# Patient Record
Sex: Male | Born: 1935 | Race: Black or African American | Hispanic: No | Marital: Married | Smoking: Former smoker
Health system: Southern US, Community
[De-identification: ages and names within clinical notes are randomized; demographics above are authoritative.]

## PROBLEM LIST (undated history)

## (undated) DIAGNOSIS — R011 Cardiac murmur, unspecified: Secondary | ICD-10-CM

## (undated) DIAGNOSIS — I10 Essential (primary) hypertension: Secondary | ICD-10-CM

## (undated) DIAGNOSIS — C61 Malignant neoplasm of prostate: Secondary | ICD-10-CM

## (undated) DIAGNOSIS — J189 Pneumonia, unspecified organism: Secondary | ICD-10-CM

## (undated) DIAGNOSIS — I714 Abdominal aortic aneurysm, without rupture, unspecified: Secondary | ICD-10-CM

## (undated) DIAGNOSIS — K922 Gastrointestinal hemorrhage, unspecified: Secondary | ICD-10-CM

## (undated) DIAGNOSIS — K219 Gastro-esophageal reflux disease without esophagitis: Secondary | ICD-10-CM

## (undated) DIAGNOSIS — Z950 Presence of cardiac pacemaker: Secondary | ICD-10-CM

## (undated) DIAGNOSIS — G4730 Sleep apnea, unspecified: Secondary | ICD-10-CM

## (undated) DIAGNOSIS — I509 Heart failure, unspecified: Secondary | ICD-10-CM

## (undated) DIAGNOSIS — I499 Cardiac arrhythmia, unspecified: Secondary | ICD-10-CM

## (undated) DIAGNOSIS — E785 Hyperlipidemia, unspecified: Secondary | ICD-10-CM

## (undated) DIAGNOSIS — E119 Type 2 diabetes mellitus without complications: Secondary | ICD-10-CM

## (undated) HISTORY — DX: Cardiac murmur, unspecified: R01.1

## (undated) HISTORY — DX: Abdominal aortic aneurysm, without rupture: I71.4

## (undated) HISTORY — PX: INSERT / REPLACE / REMOVE PACEMAKER: SUR710

## (undated) HISTORY — PX: PROSTATE SURGERY: SHX751

## (undated) HISTORY — PX: NEPHROSTOMY: SHX1014

## (undated) HISTORY — DX: Abdominal aortic aneurysm, without rupture, unspecified: I71.40

## (undated) HISTORY — PX: JOINT REPLACEMENT: SHX530

## (undated) SURGERY — INSERTION, CARDIAC PACEMAKER
Anesthesia: Moderate Sedation | Laterality: Bilateral

---

## 2005-03-04 ENCOUNTER — Ambulatory Visit: Payer: Self-pay | Admitting: Unknown Physician Specialty

## 2005-03-12 ENCOUNTER — Ambulatory Visit: Payer: Self-pay | Admitting: Unknown Physician Specialty

## 2005-04-11 ENCOUNTER — Ambulatory Visit: Payer: Self-pay | Admitting: Unknown Physician Specialty

## 2005-04-24 ENCOUNTER — Inpatient Hospital Stay: Payer: Self-pay | Admitting: Internal Medicine

## 2005-04-24 ENCOUNTER — Other Ambulatory Visit: Payer: Self-pay

## 2005-04-25 ENCOUNTER — Other Ambulatory Visit: Payer: Self-pay

## 2006-10-24 ENCOUNTER — Ambulatory Visit: Payer: Self-pay | Admitting: General Practice

## 2006-11-07 ENCOUNTER — Inpatient Hospital Stay: Payer: Self-pay | Admitting: General Practice

## 2006-11-25 ENCOUNTER — Inpatient Hospital Stay: Payer: Self-pay | Admitting: Internal Medicine

## 2006-11-25 ENCOUNTER — Other Ambulatory Visit: Payer: Self-pay

## 2007-10-19 ENCOUNTER — Ambulatory Visit: Payer: Self-pay | Admitting: Unknown Physician Specialty

## 2011-10-03 ENCOUNTER — Ambulatory Visit: Payer: Self-pay

## 2012-11-26 DIAGNOSIS — Z8546 Personal history of malignant neoplasm of prostate: Secondary | ICD-10-CM | POA: Insufficient documentation

## 2012-11-26 DIAGNOSIS — R339 Retention of urine, unspecified: Secondary | ICD-10-CM | POA: Insufficient documentation

## 2012-12-02 ENCOUNTER — Inpatient Hospital Stay: Payer: Self-pay | Admitting: Internal Medicine

## 2012-12-02 LAB — COMPREHENSIVE METABOLIC PANEL
Albumin: 2.9 g/dL — ABNORMAL LOW (ref 3.4–5.0)
Alkaline Phosphatase: 61 U/L (ref 50–136)
BUN: 24 mg/dL — ABNORMAL HIGH (ref 7–18)
Bilirubin,Total: 0.4 mg/dL (ref 0.2–1.0)
Calcium, Total: 7.7 mg/dL — ABNORMAL LOW (ref 8.5–10.1)
Chloride: 101 mmol/L (ref 98–107)
Co2: 24 mmol/L (ref 21–32)
EGFR (Non-African Amer.): 47 — ABNORMAL LOW
Glucose: 136 mg/dL — ABNORMAL HIGH (ref 65–99)
Osmolality: 274 (ref 275–301)
SGOT(AST): 25 U/L (ref 15–37)
Sodium: 134 mmol/L — ABNORMAL LOW (ref 136–145)

## 2012-12-02 LAB — URINALYSIS, COMPLETE
Nitrite: NEGATIVE
Protein: 30
RBC,UR: 126 /HPF (ref 0–5)

## 2012-12-02 LAB — CBC
HGB: 11.6 g/dL — ABNORMAL LOW (ref 13.0–18.0)
MCV: 84 fL (ref 80–100)
Platelet: 127 10*3/uL — ABNORMAL LOW (ref 150–440)
RDW: 14 % (ref 11.5–14.5)
WBC: 6 10*3/uL (ref 3.8–10.6)

## 2012-12-03 LAB — CBC WITH DIFFERENTIAL/PLATELET
Eosinophil #: 0 10*3/uL (ref 0.0–0.7)
Eosinophil %: 0.2 %
Lymphocyte #: 0.4 10*3/uL — ABNORMAL LOW (ref 1.0–3.6)
MCH: 28.7 pg (ref 26.0–34.0)
Monocyte %: 10.1 %
Neutrophil #: 3.5 10*3/uL (ref 1.4–6.5)
Neutrophil %: 80 %
Platelet: 114 10*3/uL — ABNORMAL LOW (ref 150–440)
RDW: 14.1 % (ref 11.5–14.5)
WBC: 4.3 10*3/uL (ref 3.8–10.6)

## 2012-12-03 LAB — MAGNESIUM: Magnesium: 1.6 mg/dL — ABNORMAL LOW

## 2012-12-03 LAB — BASIC METABOLIC PANEL
Anion Gap: 7 (ref 7–16)
BUN: 24 mg/dL — ABNORMAL HIGH (ref 7–18)
Calcium, Total: 8.3 mg/dL — ABNORMAL LOW (ref 8.5–10.1)
Chloride: 102 mmol/L (ref 98–107)
Creatinine: 1.43 mg/dL — ABNORMAL HIGH (ref 0.60–1.30)
EGFR (African American): 50 — ABNORMAL LOW
Potassium: 3.7 mmol/L (ref 3.5–5.1)

## 2012-12-03 LAB — HEMOGLOBIN A1C: Hemoglobin A1C: 7.1 % — ABNORMAL HIGH (ref 4.2–6.3)

## 2012-12-04 LAB — CREATININE, SERUM
EGFR (African American): 49 — ABNORMAL LOW
EGFR (Non-African Amer.): 42 — ABNORMAL LOW

## 2012-12-05 LAB — CBC WITH DIFFERENTIAL/PLATELET
Bands: 6 %
Basophil #: 0 10*3/uL (ref 0.0–0.1)
Basophil %: 0.5 %
Eosinophil #: 0.1 10*3/uL (ref 0.0–0.7)
HCT: 28.2 % — ABNORMAL LOW (ref 40.0–52.0)
HGB: 9.8 g/dL — ABNORMAL LOW (ref 13.0–18.0)
Lymphocyte #: 0.7 10*3/uL — ABNORMAL LOW (ref 1.0–3.6)
Lymphocytes: 23 %
MCH: 28.7 pg (ref 26.0–34.0)
Monocyte #: 0.5 x10 3/mm (ref 0.2–1.0)
Monocyte %: 11.4 %
Neutrophil #: 2.8 10*3/uL (ref 1.4–6.5)
Platelet: 117 10*3/uL — ABNORMAL LOW (ref 150–440)
RDW: 13.8 % (ref 11.5–14.5)
Segmented Neutrophils: 66 %

## 2012-12-05 LAB — BASIC METABOLIC PANEL
Calcium, Total: 8.1 mg/dL — ABNORMAL LOW (ref 8.5–10.1)
Chloride: 102 mmol/L (ref 98–107)
Co2: 28 mmol/L (ref 21–32)
Creatinine: 1.47 mg/dL — ABNORMAL HIGH (ref 0.60–1.30)
Potassium: 3.5 mmol/L (ref 3.5–5.1)
Sodium: 138 mmol/L (ref 136–145)

## 2012-12-05 LAB — OCCULT BLOOD X 1 CARD TO LAB, STOOL: Occult Blood, Feces: NEGATIVE

## 2012-12-06 LAB — BASIC METABOLIC PANEL
Anion Gap: 6 — ABNORMAL LOW (ref 7–16)
BUN: 17 mg/dL (ref 7–18)
Co2: 27 mmol/L (ref 21–32)
EGFR (Non-African Amer.): 50 — ABNORMAL LOW
Glucose: 112 mg/dL — ABNORMAL HIGH (ref 65–99)
Osmolality: 278 (ref 275–301)
Potassium: 3.6 mmol/L (ref 3.5–5.1)
Sodium: 138 mmol/L (ref 136–145)

## 2012-12-06 LAB — CBC WITH DIFFERENTIAL/PLATELET
Basophil #: 0 10*3/uL (ref 0.0–0.1)
Basophil %: 1 %
HCT: 28.5 % — ABNORMAL LOW (ref 40.0–52.0)
Lymphocyte #: 0.7 10*3/uL — ABNORMAL LOW (ref 1.0–3.6)
Lymphocyte %: 20 %
MCH: 28.7 pg (ref 26.0–34.0)
MCHC: 34.8 g/dL (ref 32.0–36.0)
Neutrophil #: 2.2 10*3/uL (ref 1.4–6.5)
Platelet: 129 10*3/uL — ABNORMAL LOW (ref 150–440)
RBC: 3.46 10*6/uL — ABNORMAL LOW (ref 4.40–5.90)
WBC: 3.4 10*3/uL — ABNORMAL LOW (ref 3.8–10.6)

## 2012-12-07 LAB — WOUND CULTURE

## 2012-12-08 LAB — CULTURE, BLOOD (SINGLE)

## 2012-12-29 ENCOUNTER — Emergency Department: Payer: Self-pay | Admitting: Emergency Medicine

## 2012-12-29 LAB — URINALYSIS, COMPLETE
Specific Gravity: 1.029 (ref 1.003–1.030)
Squamous Epithelial: NONE SEEN
WBC UR: NONE SEEN /HPF (ref 0–5)

## 2012-12-29 LAB — COMPREHENSIVE METABOLIC PANEL
Alkaline Phosphatase: 77 U/L (ref 50–136)
Anion Gap: 9 (ref 7–16)
Bilirubin,Total: 0.4 mg/dL (ref 0.2–1.0)
Calcium, Total: 9.6 mg/dL (ref 8.5–10.1)
Chloride: 105 mmol/L (ref 98–107)
Creatinine: 1.31 mg/dL — ABNORMAL HIGH (ref 0.60–1.30)
EGFR (African American): 56 — ABNORMAL LOW
Glucose: 128 mg/dL — ABNORMAL HIGH (ref 65–99)
Potassium: 4.2 mmol/L (ref 3.5–5.1)
Sodium: 138 mmol/L (ref 136–145)
Total Protein: 7.9 g/dL (ref 6.4–8.2)

## 2012-12-29 LAB — CBC
HCT: 33.5 % — ABNORMAL LOW (ref 40.0–52.0)
MCH: 28.4 pg (ref 26.0–34.0)
MCV: 84 fL (ref 80–100)
Platelet: 157 10*3/uL (ref 150–440)
RBC: 3.97 10*6/uL — ABNORMAL LOW (ref 4.40–5.90)
RDW: 14.5 % (ref 11.5–14.5)

## 2012-12-30 ENCOUNTER — Emergency Department: Payer: Self-pay | Admitting: Emergency Medicine

## 2013-04-18 DIAGNOSIS — N320 Bladder-neck obstruction: Secondary | ICD-10-CM | POA: Insufficient documentation

## 2013-04-18 DIAGNOSIS — N139 Obstructive and reflux uropathy, unspecified: Secondary | ICD-10-CM | POA: Insufficient documentation

## 2013-04-18 DIAGNOSIS — R310 Gross hematuria: Secondary | ICD-10-CM | POA: Insufficient documentation

## 2013-04-18 DIAGNOSIS — R300 Dysuria: Secondary | ICD-10-CM | POA: Insufficient documentation

## 2013-04-18 DIAGNOSIS — N368 Other specified disorders of urethra: Secondary | ICD-10-CM | POA: Insufficient documentation

## 2013-11-11 ENCOUNTER — Ambulatory Visit: Payer: Self-pay | Admitting: Family Medicine

## 2014-01-30 DIAGNOSIS — N471 Phimosis: Secondary | ICD-10-CM | POA: Insufficient documentation

## 2014-06-16 ENCOUNTER — Emergency Department: Payer: Self-pay | Admitting: Emergency Medicine

## 2014-06-16 LAB — CBC WITH DIFFERENTIAL/PLATELET
Basophil #: 0.1 10*3/uL (ref 0.0–0.1)
Basophil %: 1 %
EOS PCT: 2.2 %
Eosinophil #: 0.1 10*3/uL (ref 0.0–0.7)
HCT: 34.8 % — ABNORMAL LOW (ref 40.0–52.0)
HGB: 11.2 g/dL — ABNORMAL LOW (ref 13.0–18.0)
LYMPHS ABS: 0.8 10*3/uL — AB (ref 1.0–3.6)
Lymphocyte %: 13.3 %
MCH: 28.7 pg (ref 26.0–34.0)
MCHC: 32.3 g/dL (ref 32.0–36.0)
MCV: 89 fL (ref 80–100)
MONO ABS: 0.5 x10 3/mm (ref 0.2–1.0)
Monocyte %: 9.2 %
NEUTROS ABS: 4.3 10*3/uL (ref 1.4–6.5)
Neutrophil %: 74.3 %
PLATELETS: 150 10*3/uL (ref 150–440)
RBC: 3.91 10*6/uL — ABNORMAL LOW (ref 4.40–5.90)
RDW: 14.9 % — ABNORMAL HIGH (ref 11.5–14.5)
WBC: 5.7 10*3/uL (ref 3.8–10.6)

## 2014-06-16 LAB — TROPONIN I: Troponin-I: <0.02 ng/mL

## 2014-06-16 LAB — BASIC METABOLIC PANEL
ANION GAP: 10 (ref 7–16)
BUN: 29 mg/dL — AB (ref 7–18)
CALCIUM: 8.4 mg/dL — AB (ref 8.5–10.1)
CO2: 27 mmol/L (ref 21–32)
Chloride: 100 mmol/L (ref 98–107)
Creatinine: 1.48 mg/dL — ABNORMAL HIGH (ref 0.60–1.30)
EGFR (African American): 58 — ABNORMAL LOW
EGFR (Non-African Amer.): 48 — ABNORMAL LOW
Glucose: 215 mg/dL — ABNORMAL HIGH (ref 65–99)
OSMOLALITY: 286 (ref 275–301)
POTASSIUM: 4.3 mmol/L (ref 3.5–5.1)
Sodium: 137 mmol/L (ref 136–145)

## 2014-07-01 ENCOUNTER — Ambulatory Visit: Payer: Self-pay | Admitting: Internal Medicine

## 2014-11-01 NOTE — Discharge Summary (Signed)
PATIENT NAME:  Blake [name], KERWICK MR#:  [id number] DATE OF BIRTH:  1923-05-07  DATE OF ADMISSION:  12/02/2012 DATE OF DISCHARGE:    FINAL DIAGNOSES:  1.  Sepsis secondary to urinary tract infection.  2.  Metabolic encephalopathy secondary to #1.  3.  History of prostate cancer with chronic microscopic hematuria.  4.  Hyperlipidemia.  5.  Hypertension.  6.  Diabetes.  7.  Osteoarthritis.   HISTORY AND PHYSICAL: Please see dictated admission history and physical.   HOSPITAL COURSE: The patient was admitted with confusion, evidence of sepsis and was placed on antibiotics for recent urinary tract infection. His urinalysis and culture were unremarkable here; however, had been on Cipro fairly recently and it was unclear whether this was a partially treated infection. He also was noted to have a draining cyst on his back, which was cultured, with preliminary cultures not showing any significant growth. Antibiotics were adjusted for control of the above and his mental status cleared over the next several days. His blood pressure had been low and his blood pressure medications were held, and IV fluid was administered and this began to improve as well. Family members were at the bedside and they feel like his mental status has improved to baseline. The patient was ambulated with physical therapy. Initially it was felt that he might need skilled nursing due to her poor endurance and balance. This appeared to improve steadily over the next 48 hours. He complained of some brief pain in his right hip, which is really his right sacroiliac; however, this improved over the next 24 hours and on the day of discharge he went over 200 feet with a rolling walker without pain, although he has continued to have some balance issues and required close supervision. At this time, the patient will be discharged home in stable condition with his physical activity up with a walker as tolerated. He should follow a low sodium,  carbohydrate controlled diet. He should check his blood sugar daily and record this. We will anticipate him following Dermatology or surgery in 1 to 2 weeks and follow up with Dr. Bernardo Heater in the next 2 to 4 weeks. Home health nursing and physical therapy were also ordered to monitor blood pressure, the wound on his back, his urinary output, improve his balance, and monitor for any further right hip pain. Dry dressing should be applied over the back wound and changed as needed.   DISCHARGE MEDICATIONS:  1.  PreserVision 1 p.o. b.i.d.  2.  Pantoprazole 40 mg p.o. daily.  3.  Glimepiride 4 mg q.a.m., 2 mg p.o. q.p.m.  4.  Metformin 500 mg p.o. b.i.d.  5.  Crestor 5 mg p.o. at bedtime.  6.  Tamsulosin 0.4 mg p.o. daily.  7.  Finasteride 5 mg p.o. daily.  8.  Aspirin 81 mg p.o. daily.  9.  Bacitracin topically to the affected area every 8 hours, this may be stopped once the wound is healed.  10. Astelin 1 spray to each nostril b.i.d.  11. Augmentin 500 mg p.o. b.i.d. x 10 days. 12. Hydralazine 25 mg p.o. b.i.d. for blood pressure control.  13. The patient is to stop Diovan, hydrochlorothiazide, metoprolol and cefuroxime.    ____________________________ Adin Hector, MD bjk:aw D: 12/06/2012 12:58:31 ET T: 12/06/2012 13:24:24 ET JOB#: [id number]  cc: Adin Hector, MD, <Dictator> Scott C. Bernardo Heater, MD [facility] MD ELECTRONICALLY SIGNED 12/25/2012 7:19

## 2014-11-01 NOTE — H&P (Signed)
PATIENT NAME:  Blake [name], Blake [name] MR#:  [id number] DATE OF BIRTH:  [birth year]  DATE OF ADMISSION:  12/02/2012  REFERRING PHYSICIAN:  Dr. Michel Santee.  PRIMARY CARE PHYSICIAN:  Dr. Caryl Comes at [facility]  UROLOGIST: Dr. Bernardo Heater.   CHIEF COMPLAINT:  Lethargy, weakness, fevers.   HISTORY OF PRESENT ILLNESS:  The patient is a pleasant 81 year old African American male with a history of prostate cancer status post radiation and seeding, hyperlipidemia, hypertension, diabetes, who had hematuria in the setting of UTI late April, was started on Cipro and it resolved apparently. The patient had persistent symptoms and was referred to Urology. The patient underwent cystoscopy on the 16th with Dr. Bernardo Heater, who told family apparently that there are some scar tissue but otherwise no significant source. The patient had recurrent lethargy, fevers and UTI symptoms several days after that and was started on Ceftin and he has taken 2 days, however, presented with increased fatigue, weakness and fevers. Here, he had a T-max of 103. Urine and blood cultures have been obtained and the Hospitalist service were contacted for further evaluation and management. The patient did have brief episode of disorientation this morning as well.   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   ALLERGIES:  ALTACE.   PAST SURGICAL HISTORY:  Appendectomy, surgery for esophageal tear, macular degeneration laser surgery.   SOCIAL HISTORY:  No tobacco, occasional alcohol. No drug use. Married, lives with wife.   FAMILY HISTORY:  Positive for CAD, brother with hypertension and thyroid disease.   OUTPATIENT MEDICATIONS:  Aspirin 81 mg daily, cefuroxime 500 mg 2 times a day started on the 22nd,  Diovan/hydrochlorothiazide 160/12.5 mg once a day, finasteride 5 mg daily, glimepiride 4 mg in the morning, 2 mg in the evening, metformin 500 mg 2 times a day, metoprolol succinate 25 mg extended-release daily, pantoprazole 40 mg daily, PreserVision 1 tab daily, rosuvastatin 5 mg at bedtime, tamsulosin 0.4 mg once a day.   REVIEW OF SYSTEMS:  CONSTITUTIONAL:  Positive for weakness and fevers.  EYES:  Has a history of macular degeneration.  ENT:  Has perforated eardrum on the one side and decreased hearing, no tinnitus.  RESPIRATORY:  Has a nonproductive cough starting today, but otherwise no wheezing, shortness of breath, COPD or painful respirations.  CARDIOVASCULAR:  No chest pain, palpitations or arrhythmias.  GASTROINTESTINAL:  No nausea, vomiting, diarrhea, abdominal pain or melena.  GENITOURINARY:  Has dysuria, more so on initiation of urine stream. No hematuria, increased frequency as well.  ENDOCRINE:  No polyuria or nocturia. HEMATOLOGIC AND LYMPHATIC:  No anemia or easy bruising.  SKIN:  No rashes.  MUSCULOSKELETAL:  Has chronic arthritis. No active issues. NEUROLOGIC:  No numbness, focal weakness, stroke or TIA.  PSYCHIATRIC:  No anxiety or insomnia.   PHYSICAL EXAMINATION: VITAL SIGNS:  Temperature on arrival 99.5. T-max was 103.1. Initial pulse 92, respiratory rate 18, blood pressure 140/75, O2 sat 96% on room air.  GENERAL:  A well-developed African American elderly male lying in bed in no obvious distress.  HEENT:  Normocephalic, atraumatic. Pupils are equal and reactive. Anicteric sclerae. Extraocular muscles intact. Moist mucous membranes.  NECK:  Supple. No thyroid tenderness. No cervical lymphadenopathy.  CARDIOVASCULAR:  S1, S2 regular rate and rhythm. No murmurs, rubs, or gallops.  LUNGS:  Clear to auscultation  without wheezing, rhonchi or rales.  ABDOMEN:  Soft, nontender and nondistended. Hyperactive bowel sounds in all quadrants.  EXTREMITIES:  No significant lower  extremity edema.  SKIN:  No obvious rashes other than an area of drainage in midline back area a little lateral to the spine with brownish drainage without surrounding redness or cellulitis. NEUROLOGIC:  Cranial nerves II through XII grossly intact. Strength is 5/5 in all extremities. Sensation intact to light touch.  PSYCHIATRIC:  Awake, alert, oriented x 3. Cooperative, pleasant, conversant.   LABORATORY, DIAGNOSTIC, AND RADIOLOGICAL DATA:  Glucose 136, BUN 24, creatinine 1.35, sodium 134, potassium 3.3. LFTs:  Albumin is 2.9, otherwise within normal limits. Troponin negative. WBC 6, hemoglobin 11.6, platelets 127. UA had 3+ blood, no nitrites or leukocyte esterase, 126 RBC, 6 WBC, trace bacteria. X-ray of the chest, PA and lateral, shows mild opacity at the right cardiophrenic angle, could be atelectasis.   EKG:  Sinus rhythm with first-degree AV block, rate is 82, no acute ST elevations or depressions.   ASSESSMENT AND PLAN:  We have an 81 year old African American male with diabetes, hypertension, a history of prostate cancer status post radiation and seeding with episode of hematuria, urinary tract infection, resolved with Cipro, who developed recurrent symptoms and had cystoscopy on May 16th, and has had fevers, lethargy and urinary tract infection symptoms post cystoscopy, and Ceftin was started, today is day 3. At this point, given failed outpatient therapy, I would admit the patient to the hospital for IV antibiotics. Also start the patient on IV Levaquin. Blood and urine cultures have been obtained. I will start the patient on gentle IV fluids. I believe the patient has a recurrent urinary tract infection likely complicated in the setting of recent instrumentations. We will see what the cultures show but the urinalysis is currently not suggestive of infection, but that could be secondary to be secondary to being on antibiotics with partial treatment. The back might be a source abscess versus  sebaceous cyst. That is draining adequately. I would order warm compresses. There is no surrounding cellulitis or redness, otherwise. There is no other sources at this point. He has a dry cough but doubt that he has a pneumonia and started only today and his symptoms precede that. He does have mild hypokalemia and it would be repleted. We would hold the Diovan and resume the other medications. I would resume the glimepiride, hold the metformin, start the patient on sliding scale insulin and check a hemoglobin A1c. I would continue his benign prostatic hypertrophy medications.   CODE STATUS:  The patient is a FULL CODE.   TOTAL TIME SPENT:  55 minutes.   ____________________________ Vivien Presto, MD sa:jm D: 12/02/2012 15:46:19 ET T: 12/02/2012 16:23:19 ET JOB#: [id number]  cc: Vivien Presto, MD, <Dictator> Dr. Est[facility] MD ELECTRONICALLY SIGNED 12/12/2012 20:06

## 2015-04-08 ENCOUNTER — Emergency Department: Payer: Medicare Other

## 2015-04-08 ENCOUNTER — Inpatient Hospital Stay
Admission: EM | Admit: 2015-04-08 | Discharge: 2015-04-15 | DRG: 242 | Disposition: A | Discharge status: Home Health | Payer: Medicare Other | Attending: Internal Medicine | Admitting: Internal Medicine

## 2015-04-08 DIAGNOSIS — I440 Atrioventricular block, first degree: Secondary | ICD-10-CM | POA: Diagnosis present

## 2015-04-08 DIAGNOSIS — Z8546 Personal history of malignant neoplasm of prostate: Secondary | ICD-10-CM

## 2015-04-08 DIAGNOSIS — I25119 Atherosclerotic heart disease of native coronary artery with unspecified angina pectoris: Secondary | ICD-10-CM | POA: Diagnosis present

## 2015-04-08 DIAGNOSIS — Z8673 Personal history of transient ischemic attack (TIA), and cerebral infarction without residual deficits: Secondary | ICD-10-CM

## 2015-04-08 DIAGNOSIS — I10 Essential (primary) hypertension: Secondary | ICD-10-CM | POA: Diagnosis present

## 2015-04-08 DIAGNOSIS — Z823 Family history of stroke: Secondary | ICD-10-CM

## 2015-04-08 DIAGNOSIS — I213 ST elevation (STEMI) myocardial infarction of unspecified site: Secondary | ICD-10-CM | POA: Diagnosis present

## 2015-04-08 DIAGNOSIS — I130 Hypertensive heart and chronic kidney disease with heart failure and stage 1 through stage 4 chronic kidney disease, or unspecified chronic kidney disease: Secondary | ICD-10-CM | POA: Diagnosis present

## 2015-04-08 DIAGNOSIS — R072 Precordial pain: Secondary | ICD-10-CM

## 2015-04-08 DIAGNOSIS — E119 Type 2 diabetes mellitus without complications: Secondary | ICD-10-CM

## 2015-04-08 DIAGNOSIS — D649 Anemia, unspecified: Secondary | ICD-10-CM | POA: Diagnosis present

## 2015-04-08 DIAGNOSIS — I495 Sick sinus syndrome: Secondary | ICD-10-CM | POA: Diagnosis present

## 2015-04-08 DIAGNOSIS — I480 Paroxysmal atrial fibrillation: Secondary | ICD-10-CM | POA: Diagnosis present

## 2015-04-08 DIAGNOSIS — I219 Acute myocardial infarction, unspecified: Secondary | ICD-10-CM | POA: Diagnosis present

## 2015-04-08 DIAGNOSIS — E785 Hyperlipidemia, unspecified: Secondary | ICD-10-CM | POA: Diagnosis present

## 2015-04-08 DIAGNOSIS — I4510 Unspecified right bundle-branch block: Secondary | ICD-10-CM | POA: Diagnosis present

## 2015-04-08 DIAGNOSIS — Z951 Presence of aortocoronary bypass graft: Secondary | ICD-10-CM

## 2015-04-08 DIAGNOSIS — R0602 Shortness of breath: Secondary | ICD-10-CM

## 2015-04-08 DIAGNOSIS — Z8249 Family history of ischemic heart disease and other diseases of the circulatory system: Secondary | ICD-10-CM

## 2015-04-08 DIAGNOSIS — K219 Gastro-esophageal reflux disease without esophagitis: Secondary | ICD-10-CM | POA: Diagnosis present

## 2015-04-08 DIAGNOSIS — I509 Heart failure, unspecified: Secondary | ICD-10-CM | POA: Diagnosis present

## 2015-04-08 DIAGNOSIS — I471 Supraventricular tachycardia: Principal | ICD-10-CM | POA: Diagnosis present

## 2015-04-08 DIAGNOSIS — E1122 Type 2 diabetes mellitus with diabetic chronic kidney disease: Secondary | ICD-10-CM | POA: Diagnosis present

## 2015-04-08 DIAGNOSIS — N400 Enlarged prostate without lower urinary tract symptoms: Secondary | ICD-10-CM | POA: Diagnosis present

## 2015-04-08 DIAGNOSIS — Z79899 Other long term (current) drug therapy: Secondary | ICD-10-CM

## 2015-04-08 DIAGNOSIS — I4892 Unspecified atrial flutter: Secondary | ICD-10-CM | POA: Diagnosis present

## 2015-04-08 DIAGNOSIS — Z950 Presence of cardiac pacemaker: Secondary | ICD-10-CM

## 2015-04-08 DIAGNOSIS — D696 Thrombocytopenia, unspecified: Secondary | ICD-10-CM | POA: Diagnosis present

## 2015-04-08 DIAGNOSIS — N183 Chronic kidney disease, stage 3 (moderate): Secondary | ICD-10-CM | POA: Diagnosis present

## 2015-04-08 HISTORY — DX: Malignant neoplasm of prostate: C61

## 2015-04-08 HISTORY — DX: Type 2 diabetes mellitus without complications: E11.9

## 2015-04-08 HISTORY — DX: Hyperlipidemia, unspecified: E78.5

## 2015-04-08 HISTORY — DX: Essential (primary) hypertension: I10

## 2015-04-08 LAB — CBC WITH DIFFERENTIAL/PLATELET
BASOS PCT: 1 %
Basophils Absolute: 0.1 10*3/uL (ref 0–0.1)
EOS ABS: 0.1 10*3/uL (ref 0–0.7)
EOS PCT: 2 %
HCT: 35.2 % — ABNORMAL LOW (ref 40.0–52.0)
Hemoglobin: 11.3 g/dL — ABNORMAL LOW (ref 13.0–18.0)
Lymphocytes Relative: 16 %
Lymphs Abs: 1 10*3/uL (ref 1.0–3.6)
MCH: 27.8 pg (ref 26.0–34.0)
MCHC: 32.1 g/dL (ref 32.0–36.0)
MCV: 86.7 fL (ref 80.0–100.0)
MONO ABS: 0.6 10*3/uL (ref 0.2–1.0)
Monocytes Relative: 8 %
NEUTROS PCT: 73 %
Neutro Abs: 4.8 10*3/uL (ref 1.4–6.5)
PLATELETS: 130 10*3/uL — AB (ref 150–440)
RBC: 4.07 MIL/uL — ABNORMAL LOW (ref 4.40–5.90)
RDW: 14.6 % — AB (ref 11.5–14.5)
WBC: 6.5 10*3/uL (ref 3.8–10.6)

## 2015-04-08 LAB — COMPREHENSIVE METABOLIC PANEL
ALBUMIN: 3.7 g/dL (ref 3.5–5.0)
ALK PHOS: 71 U/L (ref 38–126)
ALT: 12 U/L — ABNORMAL LOW (ref 17–63)
ANION GAP: 9 (ref 5–15)
AST: 24 U/L (ref 15–41)
BILIRUBIN TOTAL: 0.6 mg/dL (ref 0.3–1.2)
BUN: 30 mg/dL — AB (ref 6–20)
CALCIUM: 8.8 mg/dL — AB (ref 8.9–10.3)
CO2: 24 mmol/L (ref 22–32)
Chloride: 100 mmol/L — ABNORMAL LOW (ref 101–111)
Creatinine, Ser: 1.8 mg/dL — ABNORMAL HIGH (ref 0.61–1.24)
GFR calc Af Amer: 36 mL/min — ABNORMAL LOW (ref >60)
GFR calc non Af Amer: 31 mL/min — ABNORMAL LOW (ref >60)
GLUCOSE: 233 mg/dL — AB (ref 65–99)
Potassium: 4.6 mmol/L (ref 3.5–5.1)
Sodium: 133 mmol/L — ABNORMAL LOW (ref 135–145)
TOTAL PROTEIN: 7.1 g/dL (ref 6.5–8.1)

## 2015-04-08 LAB — TSH: TSH: 4.027 u[IU]/mL (ref 0.350–4.500)

## 2015-04-08 LAB — PHOSPHORUS: PHOSPHORUS: 3.6 mg/dL (ref 2.5–4.6)

## 2015-04-08 LAB — TROPONIN I: Troponin I: 0.03 ng/mL (ref <0.031)

## 2015-04-08 LAB — MAGNESIUM: MAGNESIUM: 2.1 mg/dL (ref 1.7–2.4)

## 2015-04-08 MED ORDER — MIDAZOLAM HCL 2 MG/2ML IJ SOLN
4.0000 mg | Freq: Once | INTRAMUSCULAR | Status: AC
  Administered 2015-04-08: 4 mg via INTRAVENOUS

## 2015-04-08 MED ORDER — SODIUM CHLORIDE 0.9 % IV BOLUS (SEPSIS)
1000.0000 mL | Freq: Once | INTRAVENOUS | Status: AC
  Administered 2015-04-08: 1000 mL via INTRAVENOUS

## 2015-04-08 NOTE — ED Provider Notes (Signed)
[facility] Emergency Department Provider Note  ____________________________________________  Time seen: 2210 on arrival by EMS  I have reviewed the triage vital signs and the nursing notes.   HISTORY  Chief Complaint Chest Pain  history Limited by critical illness   HPI Blake [name] is a 81 y.o. male is brought to the ED for chest pain. EMS report that en route they noticed that his heart rate was 200 and looked like SVT so they tried vagal maneuvers, give 2 doses of adenosine, and then went on about work, they called the ED where under my medical control reviewed the situation, time of medication dosages, weight and height of the patient, and current vitals and advised 10 mg of diltiazem. They report that after that the patient became hypotensive to 70/40.  Currently the patient denies any complaints but does seem lethargic and somnolent. His cardiologist is Dr. Clayborn Bigness   No past medical history on file. Hypertension diabetes atrial fibrillation  There are no active problems to display for this patient.    No past surgical history on file.   No current outpatient prescriptions on file. Metformin lisinopril  Allergies Review of patient's allergies indicates not on file. None  No family history on file.  Social History Social History  Substance Use Topics  . Smoking status: Not on file  . Smokeless tobacco: Not on file  . Alcohol Use: Not on file   No tobacco alcohol or drug use Review of Systems  Constitutional:   No fever or chills. No weight changes Eyes:   No blurry vision or double vision.  ENT:   No sore throat. Cardiovascular:   No chest pain. Respiratory:   No dyspnea or cough. Gastrointestinal:   Negative for abdominal pain, vomiting and diarrhea.  No BRBPR or melena. Genitourinary:   Negative for dysuria, urinary retention, bloody urine, or difficulty urinating. Musculoskeletal:   Negative for back pain. No joint swelling  or pain. Skin:   Negative for rash. Neurological:   Negative for headaches, focal weakness or numbness. Psychiatric:  No anxiety or depression.   Endocrine:  No hot/cold intolerance, changes in energy, or sleep difficulty.  10-point ROS otherwise negative.  ____________________________________________   PHYSICAL EXAM:  VITAL SIGNS: ED Triage Vitals  Enc Vitals Group     BP 04/08/15 2212 72/57 mmHg     Pulse Rate 04/08/15 2212 195     Resp 04/08/15 2212 37     Temp --      Temp src --      SpO2 04/08/15 2212 83 %     Weight --      Height --      Head Cir --      Peak Flow --      Pain Score --      Pain Loc --      Pain Edu? --      Excl. in [place]? --      Constitutional:  Lethargic, confused. In acute distress Eyes:   No scleral icterus. No conjunctival pallor. PERRL. EOMI ENT   Head:   Normocephalic and atraumatic.   Nose:   No congestion/rhinnorhea. No septal hematoma   Mouth/Throat:   MMM, no pharyngeal erythema. No peritonsillar mass. No uvula shift.   Neck:   No stridor. No SubQ emphysema. No meningismus. Hematological/Lymphatic/Immunilogical:   No cervical lymphadenopathy. Cardiovascular:   Tachycardia heart rate 200. symmetric distal pulses are present in all extremities though only producing pulsatile flow  at rate of about 100 to the extremities. No murmurs, rubs, or gallops. Respiratory:   Normal respiratory effort without tachypnea nor retractions. Breath sounds are clear and equal bilaterally. No wheezes/rales/rhonchi. Gastrointestinal:   Soft and nontender. No distention. There is no CVA tenderness.  No rebound, rigidity, or guarding. Genitourinary:   deferred Musculoskeletal:   Nontender with normal range of motion in all extremities. No joint effusions.  No lower extremity tenderness.  No edema. Neurologic:   Slightly slurring speech.  CN 2-10 normal. Motor grossly intact. No pronator drift.  No gross focal neurologic deficits are  appreciated.  Skin:    Skin is warm, dry and intact. No rash noted.  No petechiae, purpura, or bullae. Psychiatric:   Mood and affect are normal. Speech and behavior are normal. Patient exhibits appropriate insight and judgment.  ____________________________________________    LABS (pertinent positives/negatives) (all labs ordered are listed, but only abnormal results are displayed) Labs Reviewed  COMPREHENSIVE METABOLIC PANEL - Abnormal; Notable for the following:    Sodium 133 (*)    Chloride 100 (*)    Glucose, Bld 233 (*)    BUN 30 (*)    Creatinine, Ser 1.80 (*)    Calcium 8.8 (*)    ALT 12 (*)    GFR calc non Af Amer 31 (*)    GFR calc Af Amer 36 (*)    All other components within normal limits  CBC WITH DIFFERENTIAL/PLATELET - Abnormal; Notable for the following:    RBC 4.07 (*)    Hemoglobin 11.3 (*)    HCT 35.2 (*)    RDW 14.6 (*)    Platelets 130 (*)    All other components within normal limits  TROPONIN I  MAGNESIUM  PHOSPHORUS  TSH  URINALYSIS COMPLETEWITH MICROSCOPIC ([facility] ONLY)   ____________________________________________   EKG  alll EKGs interpreted by me  Initial EKG at 2213 on arrival by EMS reveals SVT with a rate of 198 with retrograde P waves. Diffuse ST depressions. Regular, narrow complex, normal axis.  A repeat EKG at 2218 after electric cardioversion reveals A. fib with heart rate 55. Left axis, 1-2 mm ST depression in V3 through V4. No ST elevation.  Repeat EKG at 2222 shows sinus rhythm at rate of 61, left axis. First-degree AV block with a PR interval of 316 ms. Right bundle branch block. 1 mm ST depression in V3 through the 5.  Repeat EKG at 2330 shows sinus bradycardia rate of 53, left axis, first-degree AV block with PR interval 278. Right bundle branch block. Normal ST segments, normal T waves.  ____________________________________________    RADIOLOGY  Chest x-ray reveals cardiomegaly with mild pulmonary vascular  congestion  ____________________________________________   PROCEDURES CRITICAL CARE Performed by: Joni Fears, Westlyn Glaza   Total critical care time: 35 minutes  Critical care time was exclusive of separately billable procedures and treating other patients.  Critical care was necessary to treat or prevent imminent or life-threatening deterioration.  Critical care was time spent personally by me on the following activities: development of treatment plan with patient and/or surrogate as well as nursing, discussions with consultants, evaluation of patient's response to treatment, examination of patient, obtaining history from patient or surrogate, ordering and performing treatments and interventions, ordering and review of laboratory studies, ordering and review of radiographic studies, pulse oximetry and re-evaluation of patient's condition.  ELECTRIC CARDIOVERSION Performed by: Joni Fears, Harve Spradley Consent:  The procedure was performed in an emergent situation.  Verbal consent was obtained.  Written  consent was not obtained. Risks and benefits: risks, benefits and alternatives were discussed Consent given by: emergent Patient understanding: patient states understanding of the procedure being performed Patient consent: the patient's understanding of the procedure matches consent given Required items: required blood products, implants, devices, and special equipment available Patient identity confirmed: verbally with patient and with wristband Patient sedated: yes Sedation type: moderate (conscious) sedation Sedatives: Versed 4 mg Analgesia: None Cardioversion basis: emergent Pre-procedure rhythm: SVT rate of 200 Patient position: patient was placed in a supine position Chest area: chest area exposed Electrodes: pads Electrodes placed: anterior-posterior Number of attempts: 1 Attempt 1 mode: synchronous Attempt 1 waveform: biphasic Attempt 1 shock (in Joules): 200 Attempt 1 outcome:  conversion to normal sinus rhythm Post-procedure rhythm: normal sinus rhythm Complications: no complications Patient tolerance: Patient tolerated the procedure well with no immediate complications  Procedural sedation Performed by: Joni Fears, Yidel Teuscher Consent: Verbal consent obtained. Risks and benefits: risks, benefits and alternatives were discussed Required items: required blood products, implants, devices, and special equipment available Patient identity confirmed: Verbally with patient Time out: Immediately prior to procedure a "time out" was called to verify the correct patient, procedure, equipment, support staff and site/side marked as required.  Sedation type: moderate (conscious) sedation NPO time confirmed and considedered  Sedatives: Versed   Physician Time at Bedside: 15 minutes  Vitals: Vital signs were monitored during sedation. Cardiac Monitor, pulse oximeter Patient tolerance: Patient tolerated the procedure well with no immediate complications. Comments: Pt with uneventful recovered. Returned to pre-procedural sedation baseline   ____________________________________________   INITIAL IMPRESSION / ASSESSMENT AND PLAN / ED COURSE  Pertinent labs & imaging results that were available during my care of the patient were reviewed by me and considered in my medical decision making (see chart for details).  Patient presented in SVT. I spoke with the EMS prior to hospital arrival, when they had arrived on scene and found the patient to have SVT with a heart rate of 200. They gave him 6 mg of adenosine without effects of the knee gave him 12 mg of adenosine without effect. This was after vagal maneuvers were unsuccessful for that as well. At that time he had a blood pressure of 110/80 and since he weighs about 200 pounds and is 6 foot 2 advise them to go ahead and give him 10 mg of diltiazem as well.  On arrival in the ED EMS reported after the diltiazem and his blood  pressure decreased to 70/40. On repeat ED he was confirmed to be hypotensive. He was also somewhat somnolent and lethargic, so was necessary to proceed with electric cardioversion emergently has no other medical management was safe.  Cardioversion was performed at 2217 with success. Patient was sedated with Versed prior to the cardioversion. Blood pressure immediately improved to normal, proximal and 110/80 again. The patient remains somnolent.  10:25 PM patient's vital signs remained stable. Patient somnolent with good oxygen saturations and spontaneous respirations  10:35 PM patient's vital signs remained stable. Discussed with daughter at the bedside.  11:15 PM patient awake conversant, denies chest pain at present time. Daughter and wife updated. We'll plan for admission for further evaluation. Spouse able to provide additional history that the patient sees cardiologist Dr. Clayborn Bigness and has history of hypertension and diabetes. No history of heart attack or coronary artery intervention.   ----------------------------------------- 12:08 AM on 04/09/2015 -----------------------------------------  Labs unremarkable. Case discussed with hospitalist.  ____________________________________________   FINAL CLINICAL IMPRESSION(S) / ED DIAGNOSES  Final diagnoses:  SVT (supraventricular tachycardia)  Precordial pain      Carrie Mew, MD 04/09/15 [phone]

## 2015-04-08 NOTE — ED Notes (Signed)
Assumed pt care at this time. NAD noted. RR even and nonlabored. Family at bedside. Will continue to monitor.

## 2015-04-08 NOTE — ED Notes (Addendum)
Pt to ED via EMS from home c/o chest pain. Per EMS, pt HR 206, BP 70s/50s. Pt A&Ox4, states he feels fine. Pt is lethargic but able to answer questions. Presents with SVT, hypotension. Given adenosine and cardizem by EMS with no improvement.

## 2015-04-08 NOTE — ED Notes (Signed)
Pt showing SVT on monitor, shocked administered x1. Pt resumed NSR.

## 2015-04-09 ENCOUNTER — Encounter: Payer: Self-pay | Admitting: Internal Medicine

## 2015-04-09 DIAGNOSIS — Z823 Family history of stroke: Secondary | ICD-10-CM | POA: Diagnosis not present

## 2015-04-09 DIAGNOSIS — I480 Paroxysmal atrial fibrillation: Secondary | ICD-10-CM | POA: Diagnosis not present

## 2015-04-09 DIAGNOSIS — E1122 Type 2 diabetes mellitus with diabetic chronic kidney disease: Secondary | ICD-10-CM | POA: Diagnosis not present

## 2015-04-09 DIAGNOSIS — K219 Gastro-esophageal reflux disease without esophagitis: Secondary | ICD-10-CM | POA: Diagnosis not present

## 2015-04-09 DIAGNOSIS — I213 ST elevation (STEMI) myocardial infarction of unspecified site: Secondary | ICD-10-CM | POA: Diagnosis not present

## 2015-04-09 DIAGNOSIS — I130 Hypertensive heart and chronic kidney disease with heart failure and stage 1 through stage 4 chronic kidney disease, or unspecified chronic kidney disease: Secondary | ICD-10-CM | POA: Diagnosis not present

## 2015-04-09 DIAGNOSIS — I219 Acute myocardial infarction, unspecified: Secondary | ICD-10-CM | POA: Diagnosis present

## 2015-04-09 DIAGNOSIS — I509 Heart failure, unspecified: Secondary | ICD-10-CM | POA: Diagnosis not present

## 2015-04-09 DIAGNOSIS — Z8249 Family history of ischemic heart disease and other diseases of the circulatory system: Secondary | ICD-10-CM | POA: Diagnosis not present

## 2015-04-09 DIAGNOSIS — I10 Essential (primary) hypertension: Secondary | ICD-10-CM | POA: Diagnosis present

## 2015-04-09 DIAGNOSIS — I471 Supraventricular tachycardia: Secondary | ICD-10-CM | POA: Diagnosis present

## 2015-04-09 DIAGNOSIS — E785 Hyperlipidemia, unspecified: Secondary | ICD-10-CM | POA: Diagnosis not present

## 2015-04-09 DIAGNOSIS — I495 Sick sinus syndrome: Secondary | ICD-10-CM | POA: Diagnosis not present

## 2015-04-09 DIAGNOSIS — Z8546 Personal history of malignant neoplasm of prostate: Secondary | ICD-10-CM | POA: Diagnosis not present

## 2015-04-09 DIAGNOSIS — I4510 Unspecified right bundle-branch block: Secondary | ICD-10-CM | POA: Diagnosis not present

## 2015-04-09 DIAGNOSIS — I25119 Atherosclerotic heart disease of native coronary artery with unspecified angina pectoris: Secondary | ICD-10-CM | POA: Diagnosis not present

## 2015-04-09 DIAGNOSIS — I440 Atrioventricular block, first degree: Secondary | ICD-10-CM | POA: Diagnosis not present

## 2015-04-09 DIAGNOSIS — R072 Precordial pain: Secondary | ICD-10-CM | POA: Diagnosis present

## 2015-04-09 DIAGNOSIS — Z951 Presence of aortocoronary bypass graft: Secondary | ICD-10-CM | POA: Diagnosis not present

## 2015-04-09 DIAGNOSIS — E119 Type 2 diabetes mellitus without complications: Secondary | ICD-10-CM

## 2015-04-09 DIAGNOSIS — D696 Thrombocytopenia, unspecified: Secondary | ICD-10-CM | POA: Diagnosis not present

## 2015-04-09 DIAGNOSIS — N183 Chronic kidney disease, stage 3 (moderate): Secondary | ICD-10-CM | POA: Diagnosis not present

## 2015-04-09 DIAGNOSIS — I4892 Unspecified atrial flutter: Secondary | ICD-10-CM | POA: Diagnosis not present

## 2015-04-09 DIAGNOSIS — Z79899 Other long term (current) drug therapy: Secondary | ICD-10-CM | POA: Diagnosis not present

## 2015-04-09 DIAGNOSIS — Z8673 Personal history of transient ischemic attack (TIA), and cerebral infarction without residual deficits: Secondary | ICD-10-CM | POA: Diagnosis not present

## 2015-04-09 DIAGNOSIS — D649 Anemia, unspecified: Secondary | ICD-10-CM | POA: Diagnosis not present

## 2015-04-09 DIAGNOSIS — N400 Enlarged prostate without lower urinary tract symptoms: Secondary | ICD-10-CM | POA: Diagnosis not present

## 2015-04-09 LAB — URINALYSIS COMPLETE WITH MICROSCOPIC (ARMC ONLY)
Bilirubin Urine: NEGATIVE
Glucose, UA: NEGATIVE mg/dL
Ketones, ur: NEGATIVE mg/dL
NITRITE: NEGATIVE
PH: 5 (ref 5.0–8.0)
PROTEIN: 30 mg/dL — AB
SPECIFIC GRAVITY, URINE: 1.015 (ref 1.005–1.030)

## 2015-04-09 LAB — CBC
HEMATOCRIT: 32 % — AB (ref 40.0–52.0)
Hemoglobin: 10.8 g/dL — ABNORMAL LOW (ref 13.0–18.0)
MCH: 29.3 pg (ref 26.0–34.0)
MCHC: 33.8 g/dL (ref 32.0–36.0)
MCV: 86.7 fL (ref 80.0–100.0)
Platelets: 113 10*3/uL — ABNORMAL LOW (ref 150–440)
RBC: 3.69 MIL/uL — ABNORMAL LOW (ref 4.40–5.90)
RDW: 14.7 % — AB (ref 11.5–14.5)
WBC: 6.3 10*3/uL (ref 3.8–10.6)

## 2015-04-09 LAB — BASIC METABOLIC PANEL
Anion gap: 6 (ref 5–15)
BUN: 26 mg/dL — AB (ref 6–20)
CHLORIDE: 106 mmol/L (ref 101–111)
CO2: 28 mmol/L (ref 22–32)
CREATININE: 1.4 mg/dL — AB (ref 0.61–1.24)
Calcium: 8.8 mg/dL — ABNORMAL LOW (ref 8.9–10.3)
GFR calc Af Amer: 49 mL/min — ABNORMAL LOW (ref >60)
GFR calc non Af Amer: 43 mL/min — ABNORMAL LOW (ref >60)
GLUCOSE: 121 mg/dL — AB (ref 65–99)
POTASSIUM: 4.5 mmol/L (ref 3.5–5.1)
SODIUM: 140 mmol/L (ref 135–145)

## 2015-04-09 LAB — TROPONIN I
TROPONIN I: 0.69 ng/mL — AB (ref <0.031)
TROPONIN I: 1.54 ng/mL — AB (ref <0.031)
TROPONIN I: 1.44 ng/mL — AB (ref <0.031)

## 2015-04-09 LAB — GLUCOSE, CAPILLARY
GLUCOSE-CAPILLARY: 165 mg/dL — AB (ref 65–99)
GLUCOSE-CAPILLARY: 105 mg/dL — AB (ref 65–99)
GLUCOSE-CAPILLARY: 113 mg/dL — AB (ref 65–99)
Glucose-Capillary: 170 mg/dL — ABNORMAL HIGH (ref 65–99)
Glucose-Capillary: 95 mg/dL (ref 65–99)

## 2015-04-09 LAB — MRSA PCR SCREENING: MRSA by PCR: NEGATIVE

## 2015-04-09 MED ORDER — ONDANSETRON HCL 4 MG PO TABS: 4.0000 mg | ORAL_TABLET | Freq: Four times a day (QID) | ORAL | Status: DC | PRN

## 2015-04-09 MED ORDER — CLOPIDOGREL BISULFATE 75 MG PO TABS
75.0000 mg | ORAL_TABLET | Freq: Every day | ORAL | Status: DC
  Administered 2015-04-09 – 2015-04-11 (×3): 75 mg via ORAL
  Filled 2015-04-09 (×3): qty 1

## 2015-04-09 MED ORDER — TAMSULOSIN HCL 0.4 MG PO CAPS
0.4000 mg | ORAL_CAPSULE | Freq: Every day | ORAL | Status: DC
  Administered 2015-04-09 – 2015-04-14 (×6): 0.4 mg via ORAL
  Filled 2015-04-09 (×6): qty 1

## 2015-04-09 MED ORDER — OCUVITE-LUTEIN PO CAPS
1.0000 | ORAL_CAPSULE | Freq: Two times a day (BID) | ORAL | Status: DC
  Administered 2015-04-09 – 2015-04-15 (×13): 1 via ORAL
  Filled 2015-04-09 (×13): qty 1

## 2015-04-09 MED ORDER — FAMOTIDINE 20 MG PO TABS
20.0000 mg | ORAL_TABLET | Freq: Every day | ORAL | Status: DC
  Administered 2015-04-09 – 2015-04-15 (×7): 20 mg via ORAL
  Filled 2015-04-09 (×7): qty 1

## 2015-04-09 MED ORDER — FINASTERIDE 5 MG PO TABS
5.0000 mg | ORAL_TABLET | Freq: Every day | ORAL | Status: DC
  Administered 2015-04-09 – 2015-04-15 (×6): 5 mg via ORAL
  Filled 2015-04-09 (×6): qty 1

## 2015-04-09 MED ORDER — AZELASTINE HCL 0.1 % NA SOLN
1.0000 | Freq: Two times a day (BID) | NASAL | Status: DC
  Administered 2015-04-09 – 2015-04-15 (×13): 1 via NASAL
  Filled 2015-04-09: qty 30

## 2015-04-09 MED ORDER — INSULIN ASPART 100 UNIT/ML ~~LOC~~ SOLN
0.0000 [IU] | Freq: Every day | SUBCUTANEOUS | Status: DC
  Administered 2015-04-11: 3 [IU] via SUBCUTANEOUS
  Administered 2015-04-12 – 2015-04-13 (×2): 2 [IU] via SUBCUTANEOUS
  Filled 2015-04-09 (×2): qty 2
  Filled 2015-04-09: qty 3

## 2015-04-09 MED ORDER — TRIAMCINOLONE ACETONIDE 0.5 % EX CREA
1.0000 "application " | TOPICAL_CREAM | Freq: Two times a day (BID) | CUTANEOUS | Status: DC
  Administered 2015-04-09 – 2015-04-15 (×9): 1 via TOPICAL
  Filled 2015-04-09: qty 15

## 2015-04-09 MED ORDER — AMLODIPINE BESYLATE 5 MG PO TABS
5.0000 mg | ORAL_TABLET | Freq: Every day | ORAL | Status: DC
  Administered 2015-04-09 – 2015-04-15 (×7): 5 mg via ORAL
  Filled 2015-04-09 (×7): qty 1

## 2015-04-09 MED ORDER — NITROGLYCERIN 0.4 MG SL SUBL: 0.4000 mg | SUBLINGUAL_TABLET | SUBLINGUAL | Status: DC | PRN

## 2015-04-09 MED ORDER — ACETAMINOPHEN 325 MG PO TABS: 650.0000 mg | ORAL_TABLET | Freq: Four times a day (QID) | ORAL | Status: DC | PRN

## 2015-04-09 MED ORDER — INSULIN ASPART 100 UNIT/ML ~~LOC~~ SOLN
0.0000 [IU] | Freq: Three times a day (TID) | SUBCUTANEOUS | Status: DC
  Administered 2015-04-09 – 2015-04-10 (×3): 2 [IU] via SUBCUTANEOUS
  Administered 2015-04-11: 5 [IU] via SUBCUTANEOUS
  Administered 2015-04-12 (×2): 3 [IU] via SUBCUTANEOUS
  Administered 2015-04-13: 2 [IU] via SUBCUTANEOUS
  Administered 2015-04-13: 1 [IU] via SUBCUTANEOUS
  Administered 2015-04-13 – 2015-04-14 (×2): 2 [IU] via SUBCUTANEOUS
  Administered 2015-04-14 – 2015-04-15 (×2): 1 [IU] via SUBCUTANEOUS
  Administered 2015-04-15: 3 [IU] via SUBCUTANEOUS
  Filled 2015-04-09: qty 3
  Filled 2015-04-09: qty 1
  Filled 2015-04-09: qty 2
  Filled 2015-04-09: qty 5
  Filled 2015-04-09 (×2): qty 1
  Filled 2015-04-09 (×2): qty 2
  Filled 2015-04-09: qty 3
  Filled 2015-04-09 (×2): qty 2
  Filled 2015-04-09: qty 3
  Filled 2015-04-09: qty 2

## 2015-04-09 MED ORDER — HEPARIN SODIUM (PORCINE) 5000 UNIT/ML IJ SOLN
5000.0000 [IU] | Freq: Three times a day (TID) | INTRAMUSCULAR | Status: DC
  Administered 2015-04-09 – 2015-04-10 (×4): 5000 [IU] via SUBCUTANEOUS
  Filled 2015-04-09 (×4): qty 1

## 2015-04-09 MED ORDER — SOTALOL HCL 80 MG PO TABS
80.0000 mg | ORAL_TABLET | Freq: Two times a day (BID) | ORAL | Status: DC
  Administered 2015-04-09: 80 mg via ORAL
  Filled 2015-04-09: qty 1

## 2015-04-09 MED ORDER — SODIUM CHLORIDE 0.9 % IJ SOLN
3.0000 mL | Freq: Two times a day (BID) | INTRAMUSCULAR | Status: DC
  Administered 2015-04-09 – 2015-04-15 (×14): 3 mL via INTRAVENOUS

## 2015-04-09 MED ORDER — ONDANSETRON HCL 4 MG/2ML IJ SOLN: 4.0000 mg | Freq: Four times a day (QID) | INTRAMUSCULAR | Status: DC | PRN

## 2015-04-09 MED ORDER — ROSUVASTATIN CALCIUM 5 MG PO TABS
5.0000 mg | ORAL_TABLET | Freq: Every day | ORAL | Status: DC
  Administered 2015-04-09 – 2015-04-14 (×6): 5 mg via ORAL
  Filled 2015-04-09 (×6): qty 1

## 2015-04-09 MED ORDER — ACETAMINOPHEN 650 MG RE SUPP: 650.0000 mg | Freq: Four times a day (QID) | RECTAL | Status: DC | PRN

## 2015-04-09 MED ORDER — OXYCODONE HCL 5 MG PO TABS: 5.0000 mg | ORAL_TABLET | ORAL | Status: DC | PRN

## 2015-04-09 MED ORDER — ASPIRIN EC 81 MG PO TBEC
81.0000 mg | DELAYED_RELEASE_TABLET | Freq: Every day | ORAL | Status: DC
Start: 2015-04-09 — End: 2015-04-15
  Administered 2015-04-09 – 2015-04-15 (×7): 81 mg via ORAL
  Filled 2015-04-09 (×7): qty 1

## 2015-04-09 MED ORDER — MORPHINE SULFATE (PF) 2 MG/ML IV SOLN: 2.0000 mg | INTRAVENOUS | Status: DC | PRN

## 2015-04-09 MED ORDER — SOTALOL HCL 120 MG PO TABS
120.0000 mg | ORAL_TABLET | Freq: Two times a day (BID) | ORAL | Status: DC
  Administered 2015-04-09 – 2015-04-11 (×4): 120 mg via ORAL
  Filled 2015-04-09 (×4): qty 1

## 2015-04-09 MED ORDER — CETYLPYRIDINIUM CHLORIDE 0.05 % MT LIQD
7.0000 mL | Freq: Two times a day (BID) | OROMUCOSAL | Status: DC
  Administered 2015-04-09 – 2015-04-15 (×9): 7 mL via OROMUCOSAL

## 2015-04-09 NOTE — Consult Note (Signed)
[facility] Cardiology  CARDIOLOGY CONSULT NOTE  Patient ID: LIDO FROSCH MRN: [id number] DOB/AGE: 79/12/1922 79 y.o.  Admit date: 04/08/2015 Referring Physician Ramonita Lab M.D. Primary Physician Ramonita Lab M.D. Primary Cardiologist Lujean Amel M.D. Reason for Consultation SVT  HPI: The patient is a 81 year old gentleman with history of paroxysmal atrial fibrillation, who is admitted following an episode of chest discomfort, found to have rapid narrow complex tachycardia by EMS, refractory to adenosine and, and Cardizem. Patient subsequently underwent successful cardioversion, initially to atrial fibrillation then sinus rhythm with first-degree AV block. The patient has remained in sinus rhythm overnight, currently chest pain-free. Patient did develop out elevation in troponin, with a peak value of 1.54 and the absence of chest pain or ECG ischemic changes.  Review of systems complete and found to be negative unless listed above     Past Medical History  Diagnosis Date  . Hypertension   . Diabetes mellitus without complication   . Hyperlipidemia   . Prostate cancer     History reviewed. No pertinent past surgical history.  Prescriptions prior to admission  Medication Sig Dispense Refill Last Dose  . amLODipine (NORVASC) 5 MG tablet Take 5 mg by mouth daily.   04/08/2015 at Unknown time  . azelastine (ASTELIN) 0.1 % nasal spray Place 1 spray into both nostrils 2 (two) times daily. Use in each nostril as directed   04/08/2015 at Unknown time  . clopidogrel (PLAVIX) 75 MG tablet Take 75 mg by mouth daily.   04/08/2015 at Unknown time  . finasteride (PROSCAR) 5 MG tablet Take 5 mg by mouth daily.   04/08/2015 at Unknown time  . glimepiride (AMARYL) 2 MG tablet Take 2-4 mg by mouth 2 (two) times daily. 2 tablets in the morning and one tablet at night   04/08/2015 at Unknown time  . metFORMIN (GLUCOPHAGE) 500 MG tablet Take 500-1,000 mg by mouth 2 (two) times daily with a meal. 1000mg  in the  morning and 500mg  in the evening     . Multiple Vitamins-Minerals (PRESERVISION/LUTEIN PO) Take 1 capsule by mouth 2 (two) times daily.   04/08/2015 at Unknown time  . ranitidine (ZANTAC) 150 MG tablet Take 150 mg by mouth 2 (two) times daily.   04/08/2015 at Unknown time  . rosuvastatin (CRESTOR) 5 MG tablet Take 5 mg by mouth at bedtime.   Past Week at Unknown time  . sotalol (BETAPACE) 80 MG tablet Take 80 mg by mouth 2 (two) times daily.   04/08/2015 at 1100  . tamsulosin (FLOMAX) 0.4 MG CAPS capsule Take 0.4 mg by mouth at bedtime.   Past Week at Unknown time  . triamcinolone cream (KENALOG) 0.5 % Apply 1 application topically 2 (two) times daily.   04/08/2015 at Unknown time   Social History   Social History  . Marital Status: Married    Spouse Name: N/A  . Number of Children: N/A  . Years of Education: N/A   Occupational History  . Not on file.   Social History Main Topics  . Smoking status: Never Smoker   . Smokeless tobacco: Not on file  . Alcohol Use: No  . Drug Use: No  . Sexual Activity: Not on file   Other Topics Concern  . Not on file   Social History Narrative  . No narrative on file    Family History  Problem Relation Age of Onset  . Hypertension Other   . Stroke Other       Review of systems  complete and found to be negative unless listed above      PHYSICAL EXAM  General: Well developed, well nourished, in no acute distress HEENT:  Normocephalic and atramatic Neck:  No JVD.  Lungs: Clear bilaterally to auscultation and percussion. Heart: HRRR . Normal S1 and S2 without gallops or murmurs.  Abdomen: Bowel sounds are positive, abdomen soft and non-tender  Msk:  Back normal, normal gait. Normal strength and tone for age. Extremities: No clubbing, cyanosis or edema.   Neuro: Alert and oriented X 3. Psych:  Good affect, responds appropriately  Labs:   Lab Results  Component Value Date   WBC 6.3 04/09/2015   HGB 10.8* 04/09/2015   HCT 32.0*  04/09/2015   MCV 86.7 04/09/2015   PLT 113* 04/09/2015    Recent Labs Lab 04/08/15 2234 04/09/15 0737  NA 133* 140  K 4.6 4.5  CL 100* 106  CO2 24 28  BUN 30* 26*  CREATININE 1.80* 1.40*  CALCIUM 8.8* 8.8*  PROT 7.1  --   BILITOT 0.6  --   ALKPHOS 71  --   ALT 12*  --   AST 24  --   GLUCOSE 233* 121*   Lab Results  Component Value Date   TROPONINI 1.54* 04/09/2015   No results found for: CHOL No results found for: HDL No results found for: LDLCALC No results found for: TRIG No results found for: CHOLHDL No results found for: LDLDIRECT    Radiology: Dg Chest Portable 1 View  04/08/2015   CLINICAL DATA:  Supra ventricular tachycardia status post cardioversion.  EXAM: PORTABLE CHEST 1 VIEW  COMPARISON:  Dec 05, 2012, June 16, 2014  FINDINGS: The heart size is enlarged. There is central pulmonary vascular congestion. There is no pleural effusion or focal pneumonia. The bones are unchanged.  IMPRESSION: Cardiomegaly with mild central pulmonary vascular congestion.   Electronically Signed   By: Abelardo Diesel M.D.   On: 04/08/2015 23:12    EKG: Initial rapid, narrow complex tachycardia with aberrancy, probable atrial flutter  ASSESSMENT AND PLAN:    1. Probable rapid atrial flutter, refractory to adenosine and Cardizem, converted to atrial fibrillation and sinus rhythm, currently clinically stable, without chest pain. 2. Mild elevation in troponin, in the setting of rapid tachycardia at a rate of 200 bpm, likely due to demand supply ischemia. 3. History of paroxysmal atrial fibrillation, chads Vasc of 3, currently on clear.-year-old, with history of aspirin allergy, and GI bleed secondary to Mallory-Weiss tear  Recommendations  1. Up titrate sotalol to 120 mg twice a day 2. Continue Plavix. Defer chronic anticoagulation in light of prior history of upper GI bleeding. 3. Refer to [facility] EP, for further evaluation, which can be done as outpatient. Patient could potentially       be a candidate for ablation. 4. Review 2-D echocardiogram   Signed: Glennette Galster MD,PhD, [facility] 04/09/2015, 12:49 PM

## 2015-04-09 NOTE — Progress Notes (Signed)
Dr. Saralyn Pilar made aware of pt tropin results. No interventions at this time.

## 2015-04-09 NOTE — Progress Notes (Signed)
Patient ID: Blake [name], male   DOB: 1922/10/16, 81 y.o.   MRN: [id number] SUBJECTIVE:  Admitted this AM with CP, nausea, with finding of SVT to 200 on arrival; ECG shows narrow complex regular tachycardia. Broke to sinus rhythm with 1st degree AVB. Cardiac enzymes elevated, with second troponin 0.69.  Pt reports had felt well prior to onset of symptoms last PM  ______________________________________________________________________  ROS: Please see HPI; remainder of complete 10 point ROS is negative   Past Medical History  Diagnosis Date  . Hypertension   . Diabetes mellitus without complication   . Hyperlipidemia   . Prostate cancer     History reviewed. No pertinent past surgical history.   Current facility-administered medications:  .  acetaminophen (TYLENOL) tablet 650 mg, 650 mg, Oral, Q6H PRN **OR** acetaminophen (TYLENOL) suppository 650 mg, 650 mg, Rectal, Q6H PRN, Lytle Butte, MD .  amLODipine (NORVASC) tablet 5 mg, 5 mg, Oral, Daily, Lytle Butte, MD .  antiseptic oral rinse (CPC / CETYLPYRIDINIUM CHLORIDE 0.05%) solution 7 mL, 7 mL, Mouth Rinse, BID, [name], MD .  aspirin EC tablet 81 mg, 81 mg, Oral, Daily, Lytle Butte, MD .  azelastine (ASTELIN) 0.1 % nasal spray 1 spray, 1 spray, Each Nare, BID, Lytle Butte, MD .  clopidogrel (PLAVIX) tablet 75 mg, 75 mg, Oral, Daily, Lytle Butte, MD .  famotidine (PEPCID) tablet 20 mg, 20 mg, Oral, Daily, Lytle Butte, MD .  finasteride (PROSCAR) tablet 5 mg, 5 mg, Oral, Daily, Lytle Butte, MD .  heparin injection 5,000 Units, 5,000 Units, Subcutaneous, 3 times per day, Lytle Butte, MD, 5,000 Units at 04/09/15 [phone] .  insulin aspart (novoLOG) injection 0-5 Units, 0-5 Units, Subcutaneous, QHS, Lytle Butte, MD, 0 Units at 04/09/15 0240 .  insulin aspart (novoLOG) injection 0-9 Units, 0-9 Units, Subcutaneous, TID WC, Lytle Butte, MD, 0 Units at 04/09/15 0802 .  morphine 2 MG/ML injection 2 mg, 2 mg,  Intravenous, Q4H PRN, Lytle Butte, MD .  multivitamin-lutein (OCUVITE-LUTEIN) capsule 1 capsule, 1 capsule, Oral, BID, Lytle Butte, MD .  nitroGLYCERIN (NITROSTAT) SL tablet 0.4 mg, 0.4 mg, Sublingual, Q5 min PRN, Lytle Butte, MD .  ondansetron [facility]) tablet 4 mg, 4 mg, Oral, Q6H PRN **OR** ondansetron (ZOFRAN) injection 4 mg, 4 mg, Intravenous, Q6H PRN, Lytle Butte, MD .  oxyCODONE (Oxy IR/ROXICODONE) immediate release tablet 5 mg, 5 mg, Oral, Q4H PRN, Lytle Butte, MD .  rosuvastatin (CRESTOR) tablet 5 mg, 5 mg, Oral, QHS, Lytle Butte, MD .  sodium chloride 0.9 % injection 3 mL, 3 mL, Intravenous, Q12H, Lytle Butte, MD .  sotalol (BETAPACE) tablet 80 mg, 80 mg, Oral, BID, Lytle Butte, MD .  tamsulosin (FLOMAX) capsule 0.4 mg, 0.4 mg, Oral, QHS, Lytle Butte, MD .  triamcinolone cream (KENALOG) 0.5 % 1 application, 1 application, Topical, BID, Lytle Butte, MD  PHYSICAL EXAM:  BP 151/95 mmHg  Pulse 61  Temp(Src) 97.8 F (36.6 C) (Oral)  Resp 14  Ht 6\' 2"  (1.88 m)  Wt 97.5 kg (214 lb 15.2 oz)  BMI 27.59 kg/m2  SpO2 100%  General: elderly  male, in NAD HEENT: PERRL; OP dry without lesions. Neck: supple, trachea midline, no thyromegaly Chest: normal to palpation Lungs: clear bilaterally without retractions or wheezes Cardiovascular: occ ectopy, no gallop; distal pulses 2+ Abdomen: soft, nontender, nondistended, positive bowel sounds Extremities: no clubbing, cyanosis,  edema.  Post op right knee Neuro: alert and oriented, moves all extremities. Decreased vision Derm: no significant rashes or nodules; good skin turgor Lymph: no cervical or supraclavicular lymphadenopathy  Labs and imaging studies were reviewed  ASSESSMENT/PLAN:   1. SVT/acute MI- likely rate related event; initial ECG shows SVT, with P waves evident, ? Delta changes. Cardiology to see; await echo 2. DM- cover with SSI. Cr too high to use metformin 3. HTN- cont current meds and follow;  intolerant of ACE/ARB, hydralazine

## 2015-04-09 NOTE — Progress Notes (Signed)
Notified Dr. Lavetta Nielsen of troponin of 0.69.

## 2015-04-09 NOTE — H&P (Signed)
[facility] [place] NAME: Blake [name]    MR#:  [id number]  DATE OF BIRTH:  1922/10/12   DATE OF ADMISSION:  04/08/2015  PRIMARY CARE PHYSICIAN:  Ramonita Lab  REQUESTING/REFERRING PHYSICIAN: Joni Fears  CHIEF COMPLAINT:   Chief Complaint  Patient presents with  . Chest Pain    HISTORY OF PRESENT ILLNESS:  Blake [name]  is a 81 y.o. male with a known history of essential hypertension, hyperlipidemia unspecified, presenting with chest pain. Acute onset chest pain which occurred at rest retrosternal in location with associated diaphoresis as well as nausea. Chest pain described only as "pain" nonradiating no worsening or relieving factors. He took 2 aspirin prior to EMS arrival. They noted in the markedly tachycardic and SVT heart rate in the 200 range received adenosine without much improvement emergency bursa Cardizem without much improvement subsequently underwent cardioversion. Currently in normal sinus rhythm and chest pain-free.  PAST MEDICAL HISTORY:   Past Medical History  Diagnosis Date  . Hypertension   . Diabetes mellitus without complication   . Hyperlipidemia   . Prostate cancer     PAST SURGICAL HISTORY:  History reviewed. No pertinent past surgical history.  SOCIAL HISTORY:   Social History  Substance Use Topics  . Smoking status: Never Smoker   . Smokeless tobacco: Not on file  . Alcohol Use: No    FAMILY HISTORY:   Family History  Problem Relation Age of Onset  . Hypertension Other   . Stroke Other     DRUG ALLERGIES:  No Known Allergies  REVIEW OF SYSTEMS:  REVIEW OF SYSTEMS:  CONSTITUTIONAL: Denies fevers, chills, fatigue, weakness.  EYES: Denies blurred vision, double vision, or eye pain.  EARS, NOSE, THROAT: Denies tinnitus, ear pain, hearing loss.  RESPIRATORY: denies cough, shortness of breath, wheezing  CARDIOVASCULAR: Positive chest pain, palpitations, denies edema.   GASTROINTESTINAL: Positive nausea, denies vomiting, diarrhea, abdominal pain.  GENITOURINARY: Denies dysuria, hematuria.  ENDOCRINE: Denies nocturia or thyroid problems. HEMATOLOGIC AND LYMPHATIC: Denies easy bruising or bleeding.  SKIN: Denies rash or lesions.  MUSCULOSKELETAL: Denies pain in neck, back, shoulder, knees, hips, or further arthritic symptoms.  NEUROLOGIC: Denies paralysis, paresthesias.  PSYCHIATRIC: Denies anxiety or depressive symptoms. Otherwise full review of systems performed by me is negative.   MEDICATIONS AT HOME:   Prior to Admission medications   Medication Sig Start Date End Date Taking? Authorizing Provider  amLODipine (NORVASC) 5 MG tablet Take 5 mg by mouth daily.   Yes Historical Provider, MD  azelastine (ASTELIN) 0.1 % nasal spray Place 1 spray into both nostrils 2 (two) times daily. Use in each nostril as directed   Yes Historical Provider, MD  clopidogrel (PLAVIX) 75 MG tablet Take 75 mg by mouth daily.   Yes Historical Provider, MD  finasteride (PROSCAR) 5 MG tablet Take 5 mg by mouth daily.   Yes Historical Provider, MD  glimepiride (AMARYL) 2 MG tablet Take 2-4 mg by mouth 2 (two) times daily. 2 tablets in the morning and one tablet at night   Yes Historical Provider, MD  metFORMIN (GLUCOPHAGE) 500 MG tablet Take 500-1,000 mg by mouth 2 (two) times daily with a meal. 1000mg  in the morning and 500mg  in the evening   Yes Historical Provider, MD  Multiple Vitamins-Minerals (PRESERVISION/LUTEIN PO) Take 1 capsule by mouth 2 (two) times daily.   Yes Historical Provider, MD  ranitidine (ZANTAC) 150 MG tablet Take 150 mg by mouth 2 (two) times  daily.   Yes Historical Provider, MD  rosuvastatin (CRESTOR) 5 MG tablet Take 5 mg by mouth at bedtime.   Yes Historical Provider, MD  sotalol (BETAPACE) 80 MG tablet Take 80 mg by mouth 2 (two) times daily.   Yes Historical Provider, MD  tamsulosin (FLOMAX) 0.4 MG CAPS capsule Take 0.4 mg by mouth at bedtime.   Yes  Historical Provider, MD  triamcinolone cream (KENALOG) 0.5 % Apply 1 application topically 2 (two) times daily.   Yes Historical Provider, MD      VITAL SIGNS:  Blood pressure 135/75, pulse 65, resp. rate 17, SpO2 97 %.  PHYSICAL EXAMINATION:  VITAL SIGNS: Filed Vitals:   04/09/15 0030  BP: 135/75  Pulse: 65  Resp: 17   GENERAL:81 y.o.male currently in no acute distress.  HEAD: Normocephalic, atraumatic.  EYES: Pupils equal, round, reactive to light. Extraocular muscles intact. No scleral icterus.  MOUTH: Moist mucosal membrane. Dentition intact. No abscess noted.  EAR, NOSE, THROAT: Clear without exudates. No external lesions.  NECK: Supple. No thyromegaly. No nodules. No JVD.  PULMONARY: Clear to ascultation, without wheeze rails or rhonci. No use of accessory muscles, Good respiratory effort. good air entry bilaterally CHEST: Nontender to palpation.  CARDIOVASCULAR: S1 and S2. Regular rate and rhythm. No murmurs, rubs, or gallops. Trace edema. Pedal pulses 2+ bilaterally.  GASTROINTESTINAL: Soft, nontender, nondistended. No masses. Positive bowel sounds. No hepatosplenomegaly.  MUSCULOSKELETAL: No swelling, clubbing, or edema. Range of motion full in all extremities.  NEUROLOGIC: Cranial nerves II through XII are intact. No gross focal neurological deficits. Sensation intact. Reflexes intact.  SKIN: No ulceration, lesions, rashes, or cyanosis. Skin warm and dry. Turgor intact.  PSYCHIATRIC: Mood, affect within normal limits. The patient is awake, alert and oriented x 3. Insight, judgment intact.    LABORATORY PANEL:   CBC  Recent Labs Lab 04/08/15 2234  WBC 6.5  HGB 11.3*  HCT 35.2*  PLT 130*   ------------------------------------------------------------------------------------------------------------------  Chemistries   Recent Labs Lab 04/08/15 2234  NA 133*  K 4.6  CL 100*  CO2 24  GLUCOSE 233*  BUN 30*  CREATININE 1.80*  CALCIUM 8.8*  MG 2.1  AST 24   ALT 12*  ALKPHOS 71  BILITOT 0.6   ------------------------------------------------------------------------------------------------------------------  Cardiac Enzymes  Recent Labs Lab 04/08/15 2234  TROPONINI 0.03   ------------------------------------------------------------------------------------------------------------------  RADIOLOGY:  Dg Chest Portable 1 View  04/08/2015   CLINICAL DATA:  Supra ventricular tachycardia status post cardioversion.  EXAM: PORTABLE CHEST 1 VIEW  COMPARISON:  Dec 05, 2012, June 16, 2014  FINDINGS: The heart size is enlarged. There is central pulmonary vascular congestion. There is no pleural effusion or focal pneumonia. The bones are unchanged.  IMPRESSION: Cardiomegaly with mild central pulmonary vascular congestion.   Electronically Signed   By: Abelardo Diesel M.D.   On: 04/08/2015 23:12    EKG:   Orders placed or performed during the hospital encounter of 04/08/15  . EKG 12-Lead  . EKG 12-Lead  . EKG 12-Lead  . EKG 12-Lead  . EKG 12-Lead  . EKG 12-Lead  . Repeat EKG  . Repeat EKG    IMPRESSION AND PLAN:   81 year old African-American gentleman history of type 2 diabetes non-insulin-requiring, hyperlipidemia unspecified, hypertension essential presenting in SVT requiring cardioversion.  1. Supraventricular tachycardia: Currently normal sinus rhythm, goal electrolyte potassium 4-5, magnesium greater than 2, telemetry, cardiac enzymes 3, consult cardiology, check TSH, echocardiogram, continue sotalol 2. Type 2 diabetes non-insulin-requiring: Hold oral agents at insulin  sliding scale with Accu-Cheks 3. Hyperlipidemia unspecified: Statin therapy 4. Essential hypertension Norvasc 5. Venous thromboembolism prophylactic heparin subcutaneous    All the records are reviewed and case discussed with ED provider. Management plans discussed with the patient, family and they are in agreement.  CODE STATUS: Full  TOTAL TIME TAKING CARE OF  THIS PATIENT: 50 critical minutes.    Hower,  Karenann Cai.D on 04/09/2015 at 1:12 AM  Between 7am to 6pm - Pager - [phone]  After 6pm: House Pager: - [phone]  [facility]  Office  [phone]  CC: Primary care physician;klein

## 2015-04-10 LAB — GLUCOSE, CAPILLARY
GLUCOSE-CAPILLARY: 115 mg/dL — AB (ref 65–99)
GLUCOSE-CAPILLARY: 174 mg/dL — AB (ref 65–99)
GLUCOSE-CAPILLARY: 167 mg/dL — AB (ref 65–99)
GLUCOSE-CAPILLARY: 154 mg/dL — AB (ref 65–99)

## 2015-04-10 LAB — CBC
HCT: 30.1 % — ABNORMAL LOW (ref 40.0–52.0)
Hemoglobin: 9.9 g/dL — ABNORMAL LOW (ref 13.0–18.0)
MCH: 28 pg (ref 26.0–34.0)
MCHC: 32.9 g/dL (ref 32.0–36.0)
MCV: 85.2 fL (ref 80.0–100.0)
PLATELETS: 117 10*3/uL — AB (ref 150–440)
RBC: 3.53 MIL/uL — AB (ref 4.40–5.90)
RDW: 14.5 % (ref 11.5–14.5)
WBC: 5.2 10*3/uL (ref 3.8–10.6)

## 2015-04-10 LAB — BASIC METABOLIC PANEL
ANION GAP: 5 (ref 5–15)
BUN: 23 mg/dL — ABNORMAL HIGH (ref 6–20)
CALCIUM: 8.4 mg/dL — AB (ref 8.9–10.3)
CO2: 26 mmol/L (ref 22–32)
Chloride: 106 mmol/L (ref 101–111)
Creatinine, Ser: 1.19 mg/dL (ref 0.61–1.24)
GFR, EST NON AFRICAN AMERICAN: 52 mL/min — AB (ref >60)
Glucose, Bld: 114 mg/dL — ABNORMAL HIGH (ref 65–99)
POTASSIUM: 4 mmol/L (ref 3.5–5.1)
SODIUM: 137 mmol/L (ref 135–145)

## 2015-04-10 MED ORDER — INFLUENZA VAC SPLIT QUAD 0.5 ML IM SUSY
0.5000 mL | PREFILLED_SYRINGE | INTRAMUSCULAR | Status: AC
Start: 2015-04-11 — End: 2015-04-14
  Administered 2015-04-14: 0.5 mL via INTRAMUSCULAR
  Filled 2015-04-10: qty 0.5

## 2015-04-10 NOTE — Evaluation (Signed)
Physical Therapy Evaluation Patient Details Name: Blake [name] MRN: [id number] DOB: June 09, 1923 Today's Date: 04/10/2015   History of Present Illness  The patient is a 81 year old gentleman with history of paroxysmal atrial fibrillation, who is admitted following an episode of chest discomfort, found to have rapid narrow complex tachycardia by EMS, refractory to adenosine and, and Cardizem. Patient subsequently underwent successful cardioversion, initially to atrial fibrillation then sinus rhythm with first-degree AV block. The patient has remained in sinus rhythm overnight, currently chest pain-free. Patient did develop out elevation in troponin, with a peak value of 1.54 and the absence of chest pain or ECG ischemic changes  Clinical Impression  Upon evaluation, pt sitting up in bed, A&O x 4 and denies any symptoms of pain, chest tightness/discomfort, dizziness, lightheadedness, SOB, or HA. Pt was able to demonstrate independence with bed mobility; did not require PT assist or bed rails to transfer. Pt required min assist x 1 and use of RW for performance of sit<>stand transfer. Pt ambulated ~125 feet with RW and min assist. Pt's vitals were monitored throughout the entire evaluation; HR response to exertion was noted as it increased from 70 at baseline to max of 91 with ambulation. HR returned to baseline levels in a reasonable amount of time with activity cessation. Pt will continue to benefit from skilled acute PT in order to increase functional mobility and activity tolerance. Current d/c recommendation is for HHPT; recommendation may change due to increases in function.     Follow Up Recommendations Home health PT    Equipment Recommendations  Rolling walker with 5" wheels    Recommendations for Other Services       Precautions / Restrictions Precautions Precautions: Fall Restrictions Weight Bearing Restrictions: No      Mobility  Bed Mobility Overal bed mobility: Independent             General bed mobility comments: pt transfered supine to sitting on EOB independently  Transfers Overall transfer level: Needs assistance Equipment used: Rolling walker (2 wheeled) Transfers: Sit to/from Stand Sit to Stand: Min assist            Ambulation/Gait Ambulation/Gait assistance: Min assist Ambulation Distance (Feet): 125 Feet Assistive device: Rolling walker (2 wheeled) Gait Pattern/deviations: Decreased step length - right;Decreased step length - left;Trunk flexed;Step-through pattern   Gait velocity interpretation: <1.8 ft/sec, indicative of risk for recurrent falls General Gait Details: Pt demonstrated good stability with gait utilziing RW   Stairs            Wheelchair Mobility    Modified Rankin (Stroke Patients Only)       Balance Overall balance assessment: Needs assistance Sitting-balance support: Feet supported;No upper extremity supported Sitting balance-Leahy Scale: Good       Standing balance-Leahy Scale: Fair                               Pertinent Vitals/Pain Pain Assessment: No/denies pain    Home Living Family/patient expects to be discharged to:: Private residence Living Arrangements: Spouse/significant other Available Help at Discharge: Family Type of Home: House         Home Equipment: Environmental consultant - 4 wheels;Cane - single point      Prior Function Level of Independence: Independent with assistive device(s)         Comments: Pt used SPC for community ambulation/ walking over uneven surfaces outside; also used a rollator for household ambulation  Hand Dominance        Extremity/Trunk Assessment   Upper Extremity Assessment: Overall WFL for tasks assessed (gross BUE strength 5/5 )           Lower Extremity Assessment:  (gross BLE strength 5/5)         Communication   Communication: No difficulties  Cognition Arousal/Alertness: Awake/alert Behavior During Therapy: WFL for tasks  assessed/performed Overall Cognitive Status: Within Functional Limits for tasks assessed                      General Comments      Exercises Total Joint Exercises Ankle Circles/Pumps: AROM;10 reps;Both;Supine Heel Slides: AROM;Both;5 reps;Supine      Assessment/Plan    PT Assessment Patient needs continued PT services  PT Diagnosis Abnormality of gait   PT Problem List Decreased activity tolerance;Decreased balance;Decreased mobility  PT Treatment Interventions Gait training;Functional mobility training;Therapeutic activities;Therapeutic exercise;Balance training   PT Goals (Current goals can be found in the Care Plan section) Acute Rehab PT Goals Patient Stated Goal: Pt wants to be able to move around like he was before his hospitalization PT Goal Formulation: With patient Time For Goal Achievement: 04/24/15 Potential to Achieve Goals: Fair Additional Goals Additional Goal #1: Pt will ambulate 3-5 steps in order to demonstrate adequate LE functional strength    Frequency Min 2X/week   Barriers to discharge        Co-evaluation               End of Session Equipment Utilized During Treatment: Gait belt Activity Tolerance: Patient tolerated treatment well Patient left: in chair;with call bell/phone within reach;with chair alarm set;with family/visitor present           Time: [id number] PT Time Calculation (min) (ACUTE ONLY): 28 min   Charges:         PT G CodesMilon Score 04/10/2015, 12:50 PM

## 2015-04-10 NOTE — Consult Note (Signed)
[facility] Cardiology  SUBJECTIVE: I feel good   Filed Vitals:   04/10/15 0500 04/10/15 0600 04/10/15 0700 04/10/15 0730  BP: 136/84 137/85 136/67 136/67  Pulse: 51 48 51 59  Temp:    97.7 F (36.5 C)  TempSrc:    Oral  Resp: 12 9 11 18   Height:      Weight:      SpO2: 94% 97% 98% 98%     Intake/Output Summary (Last 24 hours) at 04/10/15 [id number] Last data filed at 04/10/15 0700  Gross per 24 hour  Intake    833 ml  Output   1875 ml  Net  -1042 ml      PHYSICAL EXAM  General: Well developed, well nourished, in no acute distress HEENT:  Normocephalic and atramatic Neck:  No JVD.  Lungs: Clear bilaterally to auscultation and percussion. Heart: HRRR . Normal S1 and S2 without gallops or murmurs.  Abdomen: Bowel sounds are positive, abdomen soft and non-tender  Msk:  Back normal, normal gait. Normal strength and tone for age. Extremities: No clubbing, cyanosis or edema.   Neuro: Alert and oriented X 3. Psych:  Good affect, responds appropriately   LABS: Basic Metabolic Panel:  Recent Labs  04/08/15 2234 04/09/15 0737 04/10/15 0455  NA 133* 140 137  K 4.6 4.5 4.0  CL 100* 106 106  CO2 24 28 26   GLUCOSE 233* 121* 114*  BUN 30* 26* 23*  CREATININE 1.80* 1.40* 1.19  CALCIUM 8.8* 8.8* 8.4*  MG 2.1  --   --   PHOS 3.6  --   --    Liver Function Tests:  Recent Labs  04/08/15 2234  AST 24  ALT 12*  ALKPHOS 71  BILITOT 0.6  PROT 7.1  ALBUMIN 3.7   No results for input(s): LIPASE, AMYLASE in the last 72 hours. CBC:  Recent Labs  04/08/15 2234 04/09/15 0737 04/10/15 0455  WBC 6.5 6.3 5.2  NEUTROABS 4.8  --   --   HGB 11.3* 10.8* 9.9*  HCT 35.2* 32.0* 30.1*  MCV 86.7 86.7 85.2  PLT 130* 113* 117*   Cardiac Enzymes:  Recent Labs  04/09/15 0147 04/09/15 0737 04/09/15 1327  TROPONINI 0.69* 1.54* 1.44*   BNP: Invalid input(s): POCBNP D-Dimer: No results for input(s): DDIMER in the last 72 hours. Hemoglobin A1C: No results for input(s): HGBA1C in  the last 72 hours. Fasting Lipid Panel: No results for input(s): CHOL, HDL, LDLCALC, TRIG, CHOLHDL, LDLDIRECT in the last 72 hours. Thyroid Function Tests:  Recent Labs  04/08/15 2234  TSH 4.027   Anemia Panel: No results for input(s): VITAMINB12, FOLATE, FERRITIN, TIBC, IRON, RETICCTPCT in the last 72 hours.  Dg Chest Portable 1 View  04/08/2015   CLINICAL DATA:  Supra ventricular tachycardia status post cardioversion.  EXAM: PORTABLE CHEST 1 VIEW  COMPARISON:  Dec 05, 2012, June 16, 2014  FINDINGS: The heart size is enlarged. There is central pulmonary vascular congestion. There is no pleural effusion or focal pneumonia. The bones are unchanged.  IMPRESSION: Cardiomegaly with mild central pulmonary vascular congestion.   Electronically Signed   By: Abelardo Diesel M.D.   On: 04/08/2015 23:12     Echo pending  TELEMETRY: Sinus rhythm:  ASSESSMENT AND PLAN:  Principal Problem:   SVT (supraventricular tachycardia) Active Problems:   Diabetes   Benign hypertension   Acute myocardial infarction, initial episode of care    1. SVT, probable atrial flutter with a rapid ventricular rate, converted to sinus  rhythm with     cardioversion, remains in sinus rhythm, stable on higher dose sotalol 2. Mild elevation in troponin, probable demand supply ischemia secondary to tachycardia  Recommendations  1. Continue sotalol 2. Continue Plavix, defer chronic anticoagulation with history of GI bleed secondary to Mallory-Weiss      tear 3. Transfer to telemetry, the patient does well consider discharge home 4. Consider outpatient EP evaluation for possible catheter ablation  PARASCHOS,ALEXANDER, MD, PhD, [facility] 04/10/2015 8:22 AM

## 2015-04-10 NOTE — Progress Notes (Signed)
Patient ID: Blake [name], male   DOB: 06-Aug-1922, 81 y.o.   MRN: [id number] SUBJECTIVE:  Admitted this AM with CP, nausea, with finding of SVT to 200 on arrival; ECG shows narrow complex regular tachycardia. Required cardioversion.  Has subsequently been in sinus brady after increase in sotalol.  Denies any further CP, N/V, dyspnea.  No other c/o this AM.  ______________________________________________________________________  ROS: Please see HPI; remainder of complete 10 point ROS is negative   Past Medical History  Diagnosis Date  . Hypertension   . Diabetes mellitus without complication   . Hyperlipidemia   . Prostate cancer     History reviewed. No pertinent past surgical history.   Current facility-administered medications:  .  acetaminophen (TYLENOL) tablet 650 mg, 650 mg, Oral, Q6H PRN **OR** acetaminophen (TYLENOL) suppository 650 mg, 650 mg, Rectal, Q6H PRN, Lytle Butte, MD .  amLODipine (NORVASC) tablet 5 mg, 5 mg, Oral, Daily, Lytle Butte, MD, 5 mg at 04/09/15 1005 .  antiseptic oral rinse (CPC / CETYLPYRIDINIUM CHLORIDE 0.05%) solution 7 mL, 7 mL, Mouth Rinse, BID, [name], MD, 7 mL at 04/09/15 2228 .  aspirin EC tablet 81 mg, 81 mg, Oral, Daily, Lytle Butte, MD, 81 mg at 04/09/15 1005 .  azelastine (ASTELIN) 0.1 % nasal spray 1 spray, 1 spray, Each Nare, BID, Lytle Butte, MD, 1 spray at 04/09/15 2227 .  clopidogrel (PLAVIX) tablet 75 mg, 75 mg, Oral, Daily, Lytle Butte, MD, 75 mg at 04/09/15 1005 .  famotidine (PEPCID) tablet 20 mg, 20 mg, Oral, Daily, Lytle Butte, MD, 20 mg at 04/09/15 1005 .  finasteride (PROSCAR) tablet 5 mg, 5 mg, Oral, Daily, Lytle Butte, MD, 5 mg at 04/09/15 1004 .  [START ON 04/11/2015] Influenza vac split quadrivalent PF (FLUARIX) injection 0.5 mL, 0.5 mL, Intramuscular, Tomorrow-1000, [name], MD .  insulin aspart (novoLOG) injection 0-5 Units, 0-5 Units, Subcutaneous, QHS, Lytle Butte, MD, 0 Units at 04/09/15  0240 .  insulin aspart (novoLOG) injection 0-9 Units, 0-9 Units, Subcutaneous, TID WC, Lytle Butte, MD, 2 Units at 04/09/15 1159 .  morphine 2 MG/ML injection 2 mg, 2 mg, Intravenous, Q4H PRN, Lytle Butte, MD .  multivitamin-lutein (OCUVITE-LUTEIN) capsule 1 capsule, 1 capsule, Oral, BID, Lytle Butte, MD, 1 capsule at 04/09/15 2227 .  nitroGLYCERIN (NITROSTAT) SL tablet 0.4 mg, 0.4 mg, Sublingual, Q5 min PRN, Lytle Butte, MD .  ondansetron [facility]) tablet 4 mg, 4 mg, Oral, Q6H PRN **OR** ondansetron (ZOFRAN) injection 4 mg, 4 mg, Intravenous, Q6H PRN, Lytle Butte, MD .  oxyCODONE (Oxy IR/ROXICODONE) immediate release tablet 5 mg, 5 mg, Oral, Q4H PRN, Lytle Butte, MD .  rosuvastatin (CRESTOR) tablet 5 mg, 5 mg, Oral, QHS, Lytle Butte, MD, 5 mg at 04/09/15 2227 .  sodium chloride 0.9 % injection 3 mL, 3 mL, Intravenous, Q12H, Lytle Butte, MD, 3 mL at 04/09/15 2228 .  sotalol (BETAPACE) tablet 120 mg, 120 mg, Oral, Q12H, Isaias Cowman, MD, 120 mg at 04/09/15 2227 .  tamsulosin (FLOMAX) capsule 0.4 mg, 0.4 mg, Oral, QHS, Lytle Butte, MD, 0.4 mg at 04/09/15 2227 .  triamcinolone cream (KENALOG) 0.5 % 1 application, 1 application, Topical, BID, Lytle Butte, MD, 1 application at [phone]  PHYSICAL EXAM:  BP 136/84 mmHg  Pulse 51  Temp(Src) 98.3 F (36.8 C) (Oral)  Resp 12  Ht 6\' 2"  (1.88 m)  Wt 97.5 kg (214 lb 15.2 oz)  BMI 27.59 kg/m2  SpO2 94%  General: elderly  male, in NAD HEENT: PERRL; OP dry without lesions. Neck: supple, trachea midline, no thyromegaly Chest: normal to palpation Lungs: clear bilaterally without retractions or wheezes Cardiovascular: brady, no sig murmur; no gallop; distal pulses 2+ Abdomen: soft, nontender, nondistended, positive bowel sounds Extremities: no clubbing, cyanosis, edema.  Post op right knee Neuro: alert and oriented, moves all extremities. Decreased vision Derm: no significant rashes or nodules; good skin turgor Lymph:  no cervical or supraclavicular lymphadenopathy  Labs and imaging studies were reviewed  ASSESSMENT/PLAN:   1. SVT/acute MI- appreciate cardiology; HR is borderline low, and defer adjustment of sotalol further to cardiology. Will move to Telemetry floor and begin to mobilize to see how HR, BP will respond. 2. DM- cover with SSI.  3. HTN- good control with current meds; intolerant of ACE/ARB, hydralazine  4. Anemia/thrombocytopenia- progressive; stop heparin, as should have adequate DVT prophyllaxis with ASA, plavix.  Following CBC.  Hemoccult stools

## 2015-04-11 ENCOUNTER — Inpatient Hospital Stay: Payer: Medicare Other

## 2015-04-11 ENCOUNTER — Inpatient Hospital Stay
Admit: 2015-04-11 | Discharge: 2015-04-11 | Disposition: A | Discharge status: Home / Self Care | Payer: Medicare Other | Attending: Internal Medicine | Admitting: Internal Medicine

## 2015-04-11 LAB — CBC
HCT: 34.1 % — ABNORMAL LOW (ref 40.0–52.0)
HEMATOCRIT: 32.7 % — AB (ref 40.0–52.0)
HEMOGLOBIN: 10.7 g/dL — AB (ref 13.0–18.0)
Hemoglobin: 11.2 g/dL — ABNORMAL LOW (ref 13.0–18.0)
MCH: 27.9 pg (ref 26.0–34.0)
MCH: 28.1 pg (ref 26.0–34.0)
MCHC: 32.9 g/dL (ref 32.0–36.0)
MCHC: 32.7 g/dL (ref 32.0–36.0)
MCV: 84.9 fL (ref 80.0–100.0)
MCV: 85.9 fL (ref 80.0–100.0)
Platelets: 121 10*3/uL — ABNORMAL LOW (ref 150–440)
Platelets: 125 10*3/uL — ABNORMAL LOW (ref 150–440)
RBC: 3.85 MIL/uL — ABNORMAL LOW (ref 4.40–5.90)
RBC: 3.98 MIL/uL — ABNORMAL LOW (ref 4.40–5.90)
RDW: 14.6 % — ABNORMAL HIGH (ref 11.5–14.5)
RDW: 14.2 % (ref 11.5–14.5)
WBC: 6 10*3/uL (ref 3.8–10.6)
WBC: 5.4 10*3/uL (ref 3.8–10.6)

## 2015-04-11 LAB — COMPREHENSIVE METABOLIC PANEL
ALT: 12 U/L — ABNORMAL LOW (ref 17–63)
AST: 16 U/L (ref 15–41)
Albumin: 3.3 g/dL — ABNORMAL LOW (ref 3.5–5.0)
Alkaline Phosphatase: 69 U/L (ref 38–126)
Anion gap: 6 (ref 5–15)
BUN: 28 mg/dL — ABNORMAL HIGH (ref 6–20)
CHLORIDE: 101 mmol/L (ref 101–111)
CO2: 28 mmol/L (ref 22–32)
Calcium: 8.9 mg/dL (ref 8.9–10.3)
Creatinine, Ser: 1.5 mg/dL — ABNORMAL HIGH (ref 0.61–1.24)
GFR, EST AFRICAN AMERICAN: 45 mL/min — AB (ref >60)
GFR, EST NON AFRICAN AMERICAN: 39 mL/min — AB (ref >60)
Glucose, Bld: 269 mg/dL — ABNORMAL HIGH (ref 65–99)
POTASSIUM: 4.2 mmol/L (ref 3.5–5.1)
Sodium: 135 mmol/L (ref 135–145)
Total Bilirubin: 0.4 mg/dL (ref 0.3–1.2)
Total Protein: 6.3 g/dL — ABNORMAL LOW (ref 6.5–8.1)

## 2015-04-11 LAB — BLOOD GAS, ARTERIAL
Acid-Base Excess: 2.8 mmol/L (ref 0.0–3.0)
Bicarbonate: 27.2 mEq/L (ref 21.0–28.0)
FIO2: 0.21
O2 Saturation: 95.1 %
Patient temperature: 37
pCO2 arterial: 40 mmHg (ref 32.0–48.0)
pH, Arterial: 7.44 (ref 7.350–7.450)
pO2, Arterial: 73 mmHg — ABNORMAL LOW (ref 83.0–108.0)

## 2015-04-11 LAB — OCCULT BLOOD X 1 CARD TO LAB, STOOL: FECAL OCCULT BLD: NEGATIVE

## 2015-04-11 LAB — GLUCOSE, CAPILLARY
GLUCOSE-CAPILLARY: 125 mg/dL — AB (ref 65–99)
Glucose-Capillary: 253 mg/dL — ABNORMAL HIGH (ref 65–99)
Glucose-Capillary: 152 mg/dL — ABNORMAL HIGH (ref 65–99)
Glucose-Capillary: 264 mg/dL — ABNORMAL HIGH (ref 65–99)

## 2015-04-11 LAB — PROTIME-INR
INR: 1.15
PROTHROMBIN TIME: 14.9 s (ref 11.4–15.0)

## 2015-04-11 LAB — TYPE AND SCREEN
ABO/RH(D): B POS
Antibody Screen: NEGATIVE

## 2015-04-11 LAB — ABO/RH: ABO/RH(D): B POS

## 2015-04-11 LAB — APTT: APTT: 33 s (ref 24–36)

## 2015-04-11 MED ORDER — DEXTROSE 5 % IV SOLN
1.5000 g | INTRAVENOUS | Status: DC
  Filled 2015-04-11 (×2): qty 1.5

## 2015-04-11 MED ORDER — SOTALOL HCL 120 MG PO TABS: 120.0000 mg | ORAL_TABLET | Freq: Two times a day (BID) | ORAL | Status: DC

## 2015-04-11 MED ORDER — SOTALOL HCL 80 MG PO TABS
80.0000 mg | ORAL_TABLET | Freq: Two times a day (BID) | ORAL | Status: DC
  Administered 2015-04-13 – 2015-04-15 (×5): 80 mg via ORAL
  Filled 2015-04-11 (×6): qty 1

## 2015-04-11 NOTE — Progress Notes (Signed)
PT Cancellation Note  Patient Details Name: Blake [name] MRN: [id number] DOB: [birth year]   Cancelled Treatment:    Reason Eval/Treat Not Completed: Other (comment) (Pt waiting for cardiac pacemaker placement. Will hold PT until procedure is completed. )   Milon Score 04/11/2015, 1:11 PM

## 2015-04-11 NOTE — Progress Notes (Signed)
Room air. A fib. Takes meds ok. Family at the bedside. MD [facility] plans to have a pacemaker placed Monday per MD Paraschos. Sotolol was d/c. Pt reports no pain. Ambulated in the room and tolerated it well. Urinal. Pt has no further concerns at this time.

## 2015-04-11 NOTE — Progress Notes (Signed)
Patient ID: Blake [name], male   DOB: 1922/11/27, 81 y.o.   MRN: [id number] SUBJECTIVE:  Admitted this AM with CP, nausea, with finding of SVT to 200 on arrival; ECG shows narrow complex regular tachycardia. Required cardioversion.  Has had afib with pauses briefly; no symptoms with this  ______________________________________________________________________  ROS: Please see HPI; remainder of complete 10 point ROS is negative   Past Medical History  Diagnosis Date  . Hypertension   . Diabetes mellitus without complication   . Hyperlipidemia   . Prostate cancer     History reviewed. No pertinent past surgical history.   Current facility-administered medications:  .  acetaminophen (TYLENOL) tablet 650 mg, 650 mg, Oral, Q6H PRN **OR** acetaminophen (TYLENOL) suppository 650 mg, 650 mg, Rectal, Q6H PRN, Lytle Butte, MD .  amLODipine (NORVASC) tablet 5 mg, 5 mg, Oral, Daily, Lytle Butte, MD, 5 mg at 04/11/15 [id number] .  antiseptic oral rinse (CPC / CETYLPYRIDINIUM CHLORIDE 0.05%) solution 7 mL, 7 mL, Mouth Rinse, BID, [name], MD, 7 mL at 04/10/15 2303 .  aspirin EC tablet 81 mg, 81 mg, Oral, Daily, Lytle Butte, MD, 81 mg at 04/11/15 0853 .  azelastine (ASTELIN) 0.1 % nasal spray 1 spray, 1 spray, Each Nare, BID, Lytle Butte, MD, 1 spray at 04/11/15 [phone] .  clopidogrel (PLAVIX) tablet 75 mg, 75 mg, Oral, Daily, Lytle Butte, MD, 75 mg at 04/11/15 [id number] .  famotidine (PEPCID) tablet 20 mg, 20 mg, Oral, Daily, Lytle Butte, MD, 20 mg at 04/11/15 [phone] .  finasteride (PROSCAR) tablet 5 mg, 5 mg, Oral, Daily, Lytle Butte, MD, 5 mg at 04/10/15 1030 .  Influenza vac split quadrivalent PF (FLUARIX) injection 0.5 mL, 0.5 mL, Intramuscular, Tomorrow-1000, [name], MD, 0.5 mL at 04/11/15 1000 .  insulin aspart (novoLOG) injection 0-5 Units, 0-5 Units, Subcutaneous, QHS, Lytle Butte, MD, 0 Units at 04/09/15 0240 .  insulin aspart (novoLOG) injection 0-9 Units, 0-9 Units,  Subcutaneous, TID WC, Lytle Butte, MD, 5 Units at 04/11/15 1204 .  morphine 2 MG/ML injection 2 mg, 2 mg, Intravenous, Q4H PRN, Lytle Butte, MD .  multivitamin-lutein [facility]) capsule 1 capsule, 1 capsule, Oral, BID, Lytle Butte, MD, 1 capsule at 04/11/15 [id number] .  nitroGLYCERIN (NITROSTAT) SL tablet 0.4 mg, 0.4 mg, Sublingual, Q5 min PRN, Lytle Butte, MD .  ondansetron [facility]) tablet 4 mg, 4 mg, Oral, Q6H PRN **OR** ondansetron (ZOFRAN) injection 4 mg, 4 mg, Intravenous, Q6H PRN, Lytle Butte, MD .  oxyCODONE (Oxy IR/ROXICODONE) immediate release tablet 5 mg, 5 mg, Oral, Q4H PRN, Lytle Butte, MD .  rosuvastatin (CRESTOR) tablet 5 mg, 5 mg, Oral, QHS, Lytle Butte, MD, 5 mg at 04/10/15 2301 .  sodium chloride 0.9 % injection 3 mL, 3 mL, Intravenous, Q12H, Lytle Butte, MD, 3 mL at 04/11/15 1000 .  tamsulosin (FLOMAX) capsule 0.4 mg, 0.4 mg, Oral, QHS, Lytle Butte, MD, 0.4 mg at 04/10/15 2300 .  triamcinolone cream (KENALOG) 0.5 % 1 application, 1 application, Topical, BID, Lytle Butte, MD, 1 application at [id number]  PHYSICAL EXAM:  BP 140/78 mmHg  Pulse 60  Temp(Src) 98.5 F (36.9 C) (Oral)  Resp 18  Ht 6\' 2"  (1.88 m)  Wt 97.5 kg (214 lb 15.2 oz)  BMI 27.59 kg/m2  SpO2 96%  General: elderly  male, in NAD HEENT: PERRL; OP moist without lesions. Neck: supple, trachea  midline, no thyromegaly Chest: normal to palpation Lungs: clear bilaterally without retractions or wheezes Cardiovascular: irr irr, brady, no sig murmur; no gallop; distal pulses 2+ Abdomen: soft, nontender, nondistended, positive bowel sounds Extremities: no clubbing, cyanosis, edema.  Post op right knee Neuro: alert, moves all extremities. Decreased vision Derm: no significant rashes or nodules; good skin turgor Lymph: no cervical or supraclavicular lymphadenopathy  Labs and imaging studies were reviewed  ASSESSMENT/PLAN:   1. SVT/acute MI- appreciate cardiology; after further  discussion, concern raised about ability to tolerate higher dose sotalol without sig bradycardia.  Plan is to purse PPM on Monday to allow continued use of meds to prevent tachycardia. 2. DM- cont to cover with SSI.  3. HTN- doing well with current meds; intolerant of ACE/ARB, hydralazine  4. Anemia/thrombocytopenia- slightly better; now off heparin, as should have adequate DVT prophyllaxis with ASA, plavix.

## 2015-04-11 NOTE — Progress Notes (Signed)
*  PRELIMINARY RESULTS* Echocardiogram 2D Echocardiogram has been performed.  Blake [name] 04/11/2015, 9:31 AM

## 2015-04-11 NOTE — Progress Notes (Signed)
Subjective:   Patient states to be doing reasonably well not having any blackout spells syncope. Patient has had episodes of SVT versus atrial flutter wide-complex requiring cardioversion but none since admission now he's having bradycardic episodes in A. fib with pauses of about 2 seconds. All relatively asymptomatic.  Objective:  Vital Signs in the last 24 hours: Temp:  [98.5 F (36.9 C)-98.9 F (37.2 C)] 98.5 F (36.9 C) (09/30 0626) Pulse Rate:  [37-106] 60 (09/30 0718) Resp:  [12-27] 18 (09/30 0718) BP: (100-140)/(61-109) 140/78 mmHg (09/30 0718) SpO2:  [93 %-98 %] 96 % (09/30 0626)  Intake/Output from previous day: 09/29 0701 - 09/30 0700 In: 1083 [P.O.:1080; I.V.:3] Out: 1950 [Urine:1950] Intake/Output from this shift: Total I/O In: -  Out: 300 [Urine:300]  Physical Exam: General appearance: alert, cooperative and appears stated age Neck: no adenopathy, no carotid bruit, no JVD, supple, symmetrical, trachea midline and thyroid not enlarged, symmetric, no tenderness/mass/nodules Lungs: clear to auscultation bilaterally Heart: irregularly irregular rhythm and mid systolic click present Abdomen: soft, non-tender; bowel sounds normal; no masses,  no organomegaly Extremities: extremities normal, atraumatic, no cyanosis or edema Pulses: 2+ and symmetric Skin: Skin color, texture, turgor normal. No rashes or lesions Neurologic: Alert and oriented X 3, normal strength and tone. Normal symmetric reflexes. Normal coordination and gait  Lab Results:  Recent Labs  04/10/15 0455 04/11/15 0425  WBC 5.2 6.0  HGB 9.9* 10.7*  PLT 117* 121*    Recent Labs  04/09/15 0737 04/10/15 0455  NA 140 137  K 4.5 4.0  CL 106 106  CO2 28 26  GLUCOSE 121* 114*  BUN 26* 23*  CREATININE 1.40* 1.19    Recent Labs  04/09/15 0737 04/09/15 1327  TROPONINI 1.54* 1.44*   Hepatic Function Panel  Recent Labs  04/08/15 2234  PROT 7.1  ALBUMIN 3.7  AST 24  ALT 12*  ALKPHOS 71   BILITOT 0.6   No results for input(s): CHOL in the last 72 hours. No results for input(s): PROTIME in the last 72 hours.  Imaging: Imaging results have been reviewed  Cardiac Studies:  Assessment/Plan:  Arrhythmia CABG Coronary Artery Disease Edema Palpitations Shortness of Breath  Demand ischemia SVT Right bundle-branch block Hyperlipidemia GERD Hypertension Diabetes History of CVA Remote history of GI bleeding GERD . PLAN Continue telemetry Agree with echocardiogram for assessment of LV function and valvular structures We will discontinue sotalol for now because of bradycardia and pauses We will recommend permanent pacemaker Once pacemaker installed in the start sotalol for rate and rhythm control Consider evaluation by electrophysiology for SVT Continue hypertension control Maintain lipid management with Crestor Continue diabetes management with insulin Poor anticoagulation candidate because of GI bleeding so will continue aspirin and Plavix Continue reflux therapy with Pepcid consider adding omeprazole or Protonix Do not recommend cardiac catheter invasive procedure at this point Do not recommend ablation unless he's unable to be controlled on medications Case discussed with patient and family as well as electrophysiology  LOS: 2 days    CALLWOOD,DWAYNE D. 04/11/2015, 10:55 AM

## 2015-04-11 NOTE — Care Management Important Message (Signed)
Important Message  Patient Details  Name: Blake [name] MRN: [id number] Date of Birth: 1922-07-24   Medicare Important Message Given:  Yes-second notification given    Juliann Pulse A Allmond 04/11/2015, 10:05 AM

## 2015-04-11 NOTE — Care Management (Signed)
Patient has ruled in for AMI and it is anticipated he will require a permanent pacemaker.  Patient is not able to tolerate higher doses of medications to control his tachycardia.  Patient present from home where he lives with his wife who also has just recently required a pacemaker.  She is currently receiving home health through [facility].  Physical therapy has recommended home health physical  Therapy.  Contacted Jason with [facility] with heads up referral for SN and PT

## 2015-04-11 NOTE — Discharge Summary (Signed)
Physician Discharge Summary  Patient ID: Blake [name] MRN: [id number] DOB/AGE: [birth year] 81 y.o.  Admit date: 04/08/2015 Discharge date: 04/15/2015  Admission Diagnoses:  Discharge Diagnoses:  Principal Problem:   SVT (supraventricular tachycardia) ([place]) Active Problems:   Diabetes ([place])   Benign hypertension   Acute myocardial infarction, initial episode of care [facility])   Discharged Condition: stable  Hospital Course: admitted to CCU after finding of SVT with ventricular rate approx 200; required cardioversion, with subsequent intermittent NSR/1st degree AVB and atrial fibrillation.  Cardiac enzymes positive; cardiology evaluated pt and felt to have demand ischemia with rate related myocardial infarction.  Already on plavix, beta-blocker, statin.  Sotalol was increased, without further rapid rate, but HR became too low (<40 on occasion), though no symptoms.  PPM was placed to prevent further bradycardia and pt will d/c'ed on higher dose sotalol with keflex x 10 days  PT worked with pt and he was felt to need [facility] PT to improve ambulation and endurance, particularly with recent PPM.  Will also require [place] RN to monitor HR and BP as well as wound site.    DM was stable during hospitalization.  Taken off metformin due to worsening of his CKD stage 3.    Pt will follow low salt, carbohydrate controlled diet.  Check BP and glucose daily and record.  F/u in our office in next 1 week, with cardiology in 1 week.Up with rolling walker; pacemaker precautions left arm.  Wound care as per Cardiology  Consults: cardiology   Discharge Exam: Blood pressure 100/60, pulse 58, temperature 98.2 F (36.8 C), temperature source Oral, resp. rate 18, height 6\' 2"  (1.88 m), weight 97.5 kg (214 lb 15.2 oz), SpO2 98 %.   Disposition:       Discharge Instructions    Ambulatory referral to [facility]    Complete by:  As directed   Please evaluate Blake [name] for admission to [facility].  Disciplines requested: Nursing and Physical Therapy  Services to provide: Strengthening Exercises and Evaluate  Physician to follow patient's care (the person listed here will be responsible for signing ongoing orders): PCP  Requested Start of Care Date: Tomorrow  I certify that this patient is under my care and that I, or a Nurse Practitioner or Physician's Assistant working with me, had a face-to-face encounter that meets the physician face-to-face requirements with patient on 04/11/15. The encounter with the patient was in whole, or in part for the following medical condition(s) which is the primary reason for home health care (List medical condition). Tachycardia, DJD  Does the patient have Medicare or Medicaid?:  Yes  The encounter with the patient was in whole, or in part, for the following medical condition, which is the primary reason for home health care:  tachycardia  Reason for Medically Necessary Home Health Services:  Therapy- Therapeutic Exercises to Increase Strength and Endurance  My clinical findings support the need for the above services:  Unable to leave home safely without assistance and/or assistive device  I certify that, based on my findings, the following services are medically necessary home health services:   Physical therapy Nursing    Further, I certify that my clinical findings support that this patient is homebound due to:  Ambulates short distances less than 300 feet     Diet - low sodium heart healthy    Complete by:  As directed   Low carbohydrate     Diet - low sodium heart healthy  Complete by:  As directed      Increase activity slowly    Complete by:  As directed   Up with walker as tolerated     Increase activity slowly    Complete by:  As directed             Medication List    STOP taking these medications        metFORMIN 500 MG tablet  Commonly known as:  GLUCOPHAGE      TAKE these medications        amLODipine 5 MG  tablet  Commonly known as:  NORVASC  Take 5 mg by mouth daily.     azelastine 0.1 % nasal spray  Commonly known as:  ASTELIN  Place 1 spray into both nostrils 2 (two) times daily. Use in each nostril as directed     cephALEXin 250 MG capsule  Commonly known as:  KEFLEX  Take 1 capsule (250 mg total) by mouth 4 (four) times daily.     clopidogrel 75 MG tablet  Commonly known as:  PLAVIX  Take 75 mg by mouth daily.     finasteride 5 MG tablet  Commonly known as:  PROSCAR  Take 5 mg by mouth daily.     glimepiride 2 MG tablet  Commonly known as:  AMARYL  2 tablets in the morning and one tablet at night     PRESERVISION/LUTEIN PO  Take 1 capsule by mouth 2 (two) times daily.     ranitidine 150 MG tablet  Commonly known as:  ZANTAC  Take 150 mg by mouth 2 (two) times daily.     rosuvastatin 5 MG tablet  Commonly known as:  CRESTOR  Take 5 mg by mouth at bedtime.     sotalol 120 MG tablet  Commonly known as:  BETAPACE  Take 1 tablet (120 mg total) by mouth every 12 (twelve) hours.     tamsulosin 0.4 MG Caps capsule  Commonly known as:  FLOMAX  Take 0.4 mg by mouth at bedtime.     triamcinolone cream 0.5 %  Commonly known as:  KENALOG  Apply 1 application topically 2 (two) times daily.       Follow-up Information    Follow up with Yolonda Kida., MD. Go on 04/22/2015.   Specialties:  Cardiology, Internal Medicine   Why:  Hospital follow up, App time at 10:30 am   Contact information:   [place] [address] [phone]       Follow up with [name], MD. Go on 04/23/2015.   Specialty:  Internal Medicine   Why:  Hospital follow up, App time at 10:45 am   Contact information:   [place] [address] [phone]         Signed: [name] 04/15/2015, 1:36 PM

## 2015-04-11 NOTE — Progress Notes (Signed)
MD Caryl Comes was notified that d/c needs to be suspended and sotalol stopped due to pauses. Pacemaker will be done Monday by MD paraschos.

## 2015-04-12 ENCOUNTER — Inpatient Hospital Stay: Payer: Medicare Other

## 2015-04-12 LAB — GLUCOSE, CAPILLARY
GLUCOSE-CAPILLARY: 213 mg/dL — AB (ref 65–99)
Glucose-Capillary: 132 mg/dL — ABNORMAL HIGH (ref 65–99)
Glucose-Capillary: 240 mg/dL — ABNORMAL HIGH (ref 65–99)
Glucose-Capillary: 222 mg/dL — ABNORMAL HIGH (ref 65–99)

## 2015-04-12 NOTE — Progress Notes (Signed)
Room air. Converted to a flutter. FS are stable. A & O. Ambulated in the room and tolerated it well. Takes meds ok. No pain. Pacemaker placement on Monday 10/3. Urinal. Pt has no further concerns at this time.

## 2015-04-12 NOTE — Progress Notes (Signed)
Subjective:  Patient feels reasonably well up eating breakfast no chest pain no vertigo no dizziness denies palpitations.  Objective:  Vital Signs in the last 24 hours: Temp:  [97.6 F (36.4 C)-97.7 F (36.5 C)] 97.7 F (36.5 C) (10/01 0436) Pulse Rate:  [52-73] 56 (10/01 0723) Resp:  [19-20] 20 (10/01 0723) BP: (108-148)/(63-78) 133/72 mmHg (10/01 0723) SpO2:  [97 %-99 %] 97 % (10/01 0723)  Intake/Output from previous day: 09/30 0701 - 10/01 0700 In: 240 [P.O.:240] Out: 725 [Urine:725] Intake/Output from this shift: Total I/O In: -  Out: 200 [Urine:200]  Physical Exam: General appearance: appears older than stated age Neck: no adenopathy, no carotid bruit, no JVD, supple, symmetrical, trachea midline and thyroid not enlarged, symmetric, no tenderness/mass/nodules Lungs: clear to auscultation bilaterally Heart: regular rate and rhythm, S4 present and systolic murmur: systolic ejection 2/6, medium pitch at 2nd left intercostal space Abdomen: soft, non-tender; bowel sounds normal; no masses,  no organomegaly Extremities: extremities normal, atraumatic, no cyanosis or edema Pulses: 2+ and symmetric Skin: Skin color, texture, turgor normal. No rashes or lesions Neurologic: Alert and oriented X 3, normal strength and tone. Normal symmetric reflexes. Normal coordination and gait  Lab Results:  Recent Labs  04/11/15 0425 04/11/15 2153  WBC 6.0 5.4  HGB 10.7* 11.2*  PLT 121* 125*    Recent Labs  04/10/15 0455 04/11/15 2153  NA 137 135  K 4.0 4.2  CL 106 101  CO2 26 28  GLUCOSE 114* 269*  BUN 23* 28*  CREATININE 1.19 1.50*    Recent Labs  04/09/15 1327  TROPONINI 1.44*   Hepatic Function Panel  Recent Labs  04/11/15 2153  PROT 6.3*  ALBUMIN 3.3*  AST 16  ALT 12*  ALKPHOS 69  BILITOT 0.4   No results for input(s): CHOL in the last 72 hours. No results for input(s): PROTIME in the last 72 hours.  Imaging: Imaging results have been  reviewed  Cardiac Studies:  Assessment/Plan:  Angina Arrhythmia Atrial Fibrillation CHF Coronary Artery Disease Edema Palpitations Shortness of Breath Syncope\ Diabetes GERD Hyperlipidemia BPH Weakness Bradycardia Sick sinus syndrome SVT vs atrial flutter wide-complex per . PLAN Restart sotalol tomorrow at 80 mg twice a day Continue telemetry Not a good long-term anticoagulation candidate because of GI bleeding Continue diabetes management and control Refer for permanent pacemaker on Monday dual-chamber Continue lipid management with Crestor Blood pressure control with amlodipine SVT seemed to resolve continue sotalol for treatment Case discussed with EP, recommend conservative medical therapy Agree with echocardiogram for assessment  Physical therapy and increase activity with ambulation    LOS: 3 days    CALLWOOD,DWAYNE D. 04/12/2015, 11:23 AM

## 2015-04-12 NOTE — Progress Notes (Signed)
Patient ID: Blake [name], male   DOB: 02-08-1923, 81 y.o.   MRN: [id number] Patient ID: Blake [name], male   DOB: 1922/08/28, 81 y.o.   MRN: [id number] SUBJECTIVE:  Admitted this AM with CP, nausea, with finding of SVT to 200 on arrival; ECG shows narrow complex regular tachycardia. Required cardioversion. Patient still with significant positives overnight ______________________________________________________________________  ROS: Please see HPI; remainder of complete 10 point ROS is negative   Past Medical History  Diagnosis Date  . Hypertension   . Diabetes mellitus without complication   . Hyperlipidemia   . Prostate cancer     History reviewed. No pertinent past surgical history.   Current facility-administered medications:  .  acetaminophen (TYLENOL) tablet 650 mg, 650 mg, Oral, Q6H PRN **OR** acetaminophen (TYLENOL) suppository 650 mg, 650 mg, Rectal, Q6H PRN, Lytle Butte, MD .  amLODipine (NORVASC) tablet 5 mg, 5 mg, Oral, Daily, Lytle Butte, MD, 5 mg at 04/11/15 [id number] .  antiseptic oral rinse (CPC / CETYLPYRIDINIUM CHLORIDE 0.05%) solution 7 mL, 7 mL, Mouth Rinse, BID, [name], MD, 7 mL at 04/10/15 2303 .  aspirin EC tablet 81 mg, 81 mg, Oral, Daily, Lytle Butte, MD, 81 mg at 04/11/15 0853 .  azelastine (ASTELIN) 0.1 % nasal spray 1 spray, 1 spray, Each Nare, BID, Lytle Butte, MD, 1 spray at 04/11/15 2146 .  cefUROXime (ZINACEF) 1.5 g in dextrose 5 % 50 mL IVPB, 1.5 g, Intravenous, 60 min Pre-Op, Dwayne D Callwood, MD .  clopidogrel (PLAVIX) tablet 75 mg, 75 mg, Oral, Daily, Lytle Butte, MD, 75 mg at 04/11/15 [id number] .  famotidine (PEPCID) tablet 20 mg, 20 mg, Oral, Daily, Lytle Butte, MD, 20 mg at 04/11/15 [phone] .  finasteride (PROSCAR) tablet 5 mg, 5 mg, Oral, Daily, Lytle Butte, MD, 5 mg at 04/10/15 1030 .  Influenza vac split quadrivalent PF (FLUARIX) injection 0.5 mL, 0.5 mL, Intramuscular, Tomorrow-1000, [name], MD, 0.5 mL at 04/11/15 1000 .   insulin aspart (novoLOG) injection 0-5 Units, 0-5 Units, Subcutaneous, QHS, Lytle Butte, MD, 3 Units at 04/11/15 2154 .  insulin aspart (novoLOG) injection 0-9 Units, 0-9 Units, Subcutaneous, TID WC, Lytle Butte, MD, 5 Units at 04/11/15 1204 .  morphine 2 MG/ML injection 2 mg, 2 mg, Intravenous, Q4H PRN, Lytle Butte, MD .  multivitamin-lutein [facility]) capsule 1 capsule, 1 capsule, Oral, BID, Lytle Butte, MD, 1 capsule at 04/11/15 2145 .  nitroGLYCERIN (NITROSTAT) SL tablet 0.4 mg, 0.4 mg, Sublingual, Q5 min PRN, Lytle Butte, MD .  ondansetron [facility]) tablet 4 mg, 4 mg, Oral, Q6H PRN **OR** ondansetron (ZOFRAN) injection 4 mg, 4 mg, Intravenous, Q6H PRN, Lytle Butte, MD .  oxyCODONE (Oxy IR/ROXICODONE) immediate release tablet 5 mg, 5 mg, Oral, Q4H PRN, Lytle Butte, MD .  rosuvastatin (CRESTOR) tablet 5 mg, 5 mg, Oral, QHS, Lytle Butte, MD, 5 mg at 04/11/15 2145 .  sodium chloride 0.9 % injection 3 mL, 3 mL, Intravenous, Q12H, Lytle Butte, MD, 3 mL at 04/11/15 2146 .  [START ON 04/13/2015] sotalol (BETAPACE) tablet 80 mg, 80 mg, Oral, Q12H, Dwayne D Callwood, MD .  tamsulosin (FLOMAX) capsule 0.4 mg, 0.4 mg, Oral, QHS, Lytle Butte, MD, 0.4 mg at 04/11/15 2145 .  triamcinolone cream (KENALOG) 0.5 % 1 application, 1 application, Topical, BID, Lytle Butte, MD, 1 application at [phone]  PHYSICAL EXAM:  BP 133/72  mmHg  Pulse 56  Temp(Src) 97.7 F (36.5 C) (Oral)  Resp 20  Ht 6\' 2"  (1.88 m)  Wt 97.5 kg (214 lb 15.2 oz)  BMI 27.59 kg/m2  SpO2 97%  General: elderly  male, in NAD HEENT: PERRL; OP moist without lesions. Neck: supple, trachea midline, no thyromegaly Chest: normal to palpation Lungs: clear bilaterally without retractions or wheezes Cardiovascular: irr irr, brady, no sig murmur; no gallop; distal pulses 2+ Abdomen: soft, nontender, nondistended, positive bowel sounds Extremities: no clubbing, cyanosis, edema.  Post op right knee Neuro: alert,  moves all extremities. Decreased vision Derm: no significant rashes or nodules; good skin turgor Lymph: no cervical or supraclavicular lymphadenopathy  Labs and imaging studies were reviewed  ASSESSMENT/PLAN:   1. SVT/acute MI- appreciate cardiology; significant pauses, PPM monday 2. DM- cont to cover with SSI.  3. HTN- doing well with current meds; intolerant of ACE/ARB, hydralazine  4. Anemia/thrombocytopenia- slightly better; now off heparin, as should have adequate DVT prophyllaxis with ASA, plavix.

## 2015-04-12 NOTE — Progress Notes (Signed)
A&O. Up with standby assist. No complaints. On RA. For pacemaker insertion on MOnday.

## 2015-04-13 LAB — GLUCOSE, CAPILLARY
GLUCOSE-CAPILLARY: 136 mg/dL — AB (ref 65–99)
GLUCOSE-CAPILLARY: 198 mg/dL — AB (ref 65–99)
GLUCOSE-CAPILLARY: 208 mg/dL — AB (ref 65–99)
Glucose-Capillary: 187 mg/dL — ABNORMAL HIGH (ref 65–99)

## 2015-04-13 NOTE — Progress Notes (Signed)
Pt. A&O, resting well at this time no acute distress observed. No signs or c/o pain noted. Pacemaker placement scheduled for Monday 10/3. SR 1st degree HB on tele.

## 2015-04-13 NOTE — Progress Notes (Signed)
Subjective:   patient states he feels reasonably well today up eating breakfast no pain no syncope no dizziness no palpitations denies feeling poorly. Patient is been ambulatory to and from the bathroom and slightly in the halls with assistance.  Objective:  Vital Signs in the last 24 hours: Temp:  [97.7 F (36.5 C)-99.2 F (37.3 C)] 97.7 F (36.5 C) (10/02 1153) Pulse Rate:  [57-62] 57 (10/02 1153) Resp:  [18-20] 18 (10/02 1153) BP: (120-149)/(61-86) 123/81 mmHg (10/02 1153) SpO2:  [96 %-98 %] 96 % (10/02 1153)  Intake/Output from previous day: 10/01 0701 - 10/02 0700 In: -  Out: 1600 [Urine:1600] Intake/Output from this shift: Total I/O In: 360 [P.O.:360] Out: 150 [Urine:150]  Physical Exam: General appearance: cooperative and appears stated age Neck: no adenopathy, no carotid bruit, no JVD, supple, symmetrical, trachea midline and thyroid not enlarged, symmetric, no tenderness/mass/nodules Lungs: clear to auscultation bilaterally Heart: regular rate and rhythm, S1, S2 normal, no murmur, click, rub or gallop Abdomen: soft, non-tender; bowel sounds normal; no masses,  no organomegaly Extremities: extremities normal, atraumatic, no cyanosis or edema Pulses: 2+ and symmetric Skin: Skin color, texture, turgor normal. No rashes or lesions Neurologic: Alert and oriented X 3, normal strength and tone. Normal symmetric reflexes. Normal coordination and gait  Lab Results:  Recent Labs  04/11/15 0425 04/11/15 2153  WBC 6.0 5.4  HGB 10.7* 11.2*  PLT 121* 125*    Recent Labs  04/11/15 2153  NA 135  K 4.2  CL 101  CO2 28  GLUCOSE 269*  BUN 28*  CREATININE 1.50*   No results for input(s): TROPONINI in the last 72 hours.  Invalid input(s): CK, MB Hepatic Function Panel  Recent Labs  04/11/15 2153  PROT 6.3*  ALBUMIN 3.3*  AST 16  ALT 12*  ALKPHOS 69  BILITOT 0.4   No results for input(s): CHOL in the last 72 hours. No results for input(s): PROTIME in the  last 72 hours.  Imaging: Imaging results have been reviewed  Cardiac Studies:  Assessment/Plan:  Angina Arrhythmia Atrial Fibrillation Chest Pain Coronary Artery Disease Edema Palpitations Shortness of Breath Syncope   SVT  bradycardia  vertigo  diabetes  demand ischemia . PLAN  continue telemetry for sick sinus syndrome and bradycardia  patient is converted forms AFib to sinus rhythm  bradycardia much improved with reducing sotalol  preop for permanent pacemaker tomorrow dual chamber  for anticoagulation candidate because of previous GI bleeding  continue sotalol for SVT  maintain diabetes management therapy   physical therapy for  Ambulation and strength training  Pepcid for reflux symptoms  continue Crestor for hyperlipidemia  hypertension controlled with Norvasc  LOS: 4 days    Blake [name] D. 04/13/2015, 4:36 PM

## 2015-04-13 NOTE — Progress Notes (Signed)
Patient ID: Blake [name], male   DOB: 1922-12-21, 81 y.o.   MRN: [id number] Patient ID: Blake [name], male   DOB: 1923-06-25, 81 y.o.   MRN: [id number] Patient ID: Blake [name], male   DOB: 07/31/1922, 81 y.o.   MRN: [id number] SUBJECTIVE:  Admitted this AM with CP, nausea, with finding of SVT to 200 on arrival; ECG shows narrow complex regular tachycardia. Required cardioversion. Continues to have pauses on telemetry ______________________________________________________________________  ROS: Please see HPI; remainder of complete 10 point ROS is negative   Past Medical History  Diagnosis Date  . Hypertension   . Diabetes mellitus without complication   . Hyperlipidemia   . Prostate cancer     History reviewed. No pertinent past surgical history.   Current facility-administered medications:  .  acetaminophen (TYLENOL) tablet 650 mg, 650 mg, Oral, Q6H PRN **OR** acetaminophen (TYLENOL) suppository 650 mg, 650 mg, Rectal, Q6H PRN, Lytle Butte, MD .  amLODipine (NORVASC) tablet 5 mg, 5 mg, Oral, Daily, Lytle Butte, MD, 5 mg at 04/12/15 0926 .  antiseptic oral rinse (CPC / CETYLPYRIDINIUM CHLORIDE 0.05%) solution 7 mL, 7 mL, Mouth Rinse, BID, [name], MD, 7 mL at 04/12/15 2200 .  aspirin EC tablet 81 mg, 81 mg, Oral, Daily, Lytle Butte, MD, 81 mg at 04/12/15 0926 .  azelastine (ASTELIN) 0.1 % nasal spray 1 spray, 1 spray, Each Nare, BID, Lytle Butte, MD, 1 spray at 04/12/15 2119 .  cefUROXime (ZINACEF) 1.5 g in dextrose 5 % 50 mL IVPB, 1.5 g, Intravenous, 60 min Pre-Op, Dwayne D Callwood, MD .  famotidine (PEPCID) tablet 20 mg, 20 mg, Oral, Daily, Lytle Butte, MD, 20 mg at 04/12/15 0926 .  finasteride (PROSCAR) tablet 5 mg, 5 mg, Oral, Daily, Lytle Butte, MD, 5 mg at 04/12/15 0926 .  Influenza vac split quadrivalent PF (FLUARIX) injection 0.5 mL, 0.5 mL, Intramuscular, Tomorrow-1000, [name], MD, 0.5 mL at 04/11/15 1000 .  insulin aspart (novoLOG) injection  0-5 Units, 0-5 Units, Subcutaneous, QHS, Lytle Butte, MD, 2 Units at 04/12/15 2200 .  insulin aspart (novoLOG) injection 0-9 Units, 0-9 Units, Subcutaneous, TID WC, Lytle Butte, MD, 3 Units at 04/12/15 1714 .  morphine 2 MG/ML injection 2 mg, 2 mg, Intravenous, Q4H PRN, Lytle Butte, MD .  multivitamin-lutein [facility]) capsule 1 capsule, 1 capsule, Oral, BID, Lytle Butte, MD, 1 capsule at 04/12/15 2119 .  nitroGLYCERIN (NITROSTAT) SL tablet 0.4 mg, 0.4 mg, Sublingual, Q5 min PRN, Lytle Butte, MD .  ondansetron [facility]) tablet 4 mg, 4 mg, Oral, Q6H PRN **OR** ondansetron (ZOFRAN) injection 4 mg, 4 mg, Intravenous, Q6H PRN, Lytle Butte, MD .  oxyCODONE (Oxy IR/ROXICODONE) immediate release tablet 5 mg, 5 mg, Oral, Q4H PRN, Lytle Butte, MD .  rosuvastatin (CRESTOR) tablet 5 mg, 5 mg, Oral, QHS, Lytle Butte, MD, 5 mg at 04/12/15 2119 .  sodium chloride 0.9 % injection 3 mL, 3 mL, Intravenous, Q12H, Lytle Butte, MD, 3 mL at 04/12/15 2120 .  sotalol (BETAPACE) tablet 80 mg, 80 mg, Oral, Q12H, Dwayne D Callwood, MD .  tamsulosin (FLOMAX) capsule 0.4 mg, 0.4 mg, Oral, QHS, Lytle Butte, MD, 0.4 mg at 04/12/15 2119 .  triamcinolone cream (KENALOG) 0.5 % 1 application, 1 application, Topical, BID, Lytle Butte, MD, 1 application at [phone]  PHYSICAL EXAM:  BP 132/80 mmHg  Pulse 61  Temp(Src) 97.9  F (36.6 C) (Oral)  Resp 18  Ht 6\' 2"  (1.88 m)  Wt 97.5 kg (214 lb 15.2 oz)  BMI 27.59 kg/m2  SpO2 98%  General: elderly  male, in NAD HEENT: PERRL; OP moist without lesions. Neck: supple, trachea midline, no thyromegaly Chest: normal to palpation Lungs: clear bilaterally without retractions or wheezes Cardiovascular: irr irr, brady, no sig murmur; no gallop; distal pulses 2+ Abdomen: soft, nontender, nondistended, positive bowel sounds Extremities: no clubbing, cyanosis, edema.  Post op right knee Neuro: alert, moves all extremities. Decreased vision Derm: no  significant rashes or nodules; good skin turgor Lymph: no cervical or supraclavicular lymphadenopathy  Labs and imaging studies were reviewed  ASSESSMENT/PLAN:   1. SVT/acute MI- appreciate cardiology; significant pauses, PPM monday 2. DM- cont to cover with SSI.  3. HTN- doing well with current meds; intolerant of ACE/ARB, hydralazine  4. Anemia/thrombocytopenia- slightly better; now off heparin, as should have adequate DVT prophyllaxis with ASA,  Hold plavix.

## 2015-04-13 NOTE — Progress Notes (Signed)
Patient is hemodynamically stable,sinus rhythm bundle branch block,first degree heart block,schedule for permanent pacemaker placement tomorrow.

## 2015-04-14 ENCOUNTER — Inpatient Hospital Stay: Payer: Medicare Other

## 2015-04-14 ENCOUNTER — Encounter
Admission: EM | Disposition: A | Discharge status: Home Health | Payer: Self-pay | Source: Home / Self Care | Attending: Internal Medicine

## 2015-04-14 ENCOUNTER — Inpatient Hospital Stay: Payer: Medicare Other | Admitting: Anesthesiology

## 2015-04-14 ENCOUNTER — Encounter: Payer: Self-pay | Admitting: Cardiology

## 2015-04-14 DIAGNOSIS — I471 Supraventricular tachycardia: Secondary | ICD-10-CM | POA: Diagnosis not present

## 2015-04-14 HISTORY — PX: PACEMAKER INSERTION: SHX728

## 2015-04-14 LAB — GLUCOSE, CAPILLARY
Glucose-Capillary: 139 mg/dL — ABNORMAL HIGH (ref 65–99)
Glucose-Capillary: 130 mg/dL — ABNORMAL HIGH (ref 65–99)
Glucose-Capillary: 159 mg/dL — ABNORMAL HIGH (ref 65–99)
Glucose-Capillary: 184 mg/dL — ABNORMAL HIGH (ref 65–99)

## 2015-04-14 SURGERY — INSERTION, CARDIAC PACEMAKER
Anesthesia: General | Site: Chest | Laterality: Left | Wound class: Clean

## 2015-04-14 MED ORDER — LIDOCAINE 1 % OPTIME INJ - NO CHARGE
INTRAMUSCULAR | Status: DC | PRN
  Administered 2015-04-14: 30 mL

## 2015-04-14 MED ORDER — CEFAZOLIN SODIUM 1-5 GM-% IV SOLN
1.0000 g | Freq: Four times a day (QID) | INTRAVENOUS | Status: AC
Start: 2015-04-14 — End: 2015-04-15
  Administered 2015-04-14 – 2015-04-15 (×3): 1 g via INTRAVENOUS
  Filled 2015-04-14 (×3): qty 50

## 2015-04-14 MED ORDER — MIDAZOLAM HCL 2 MG/2ML IJ SOLN: INTRAMUSCULAR | Status: DC | PRN

## 2015-04-14 MED ORDER — FENTANYL CITRATE (PF) 100 MCG/2ML IJ SOLN: INTRAMUSCULAR | Status: DC | PRN

## 2015-04-14 MED ORDER — ONDANSETRON HCL 4 MG/2ML IJ SOLN: 4.0000 mg | Freq: Once | INTRAMUSCULAR | Status: DC | PRN

## 2015-04-14 MED ORDER — ONDANSETRON HCL 4 MG/2ML IJ SOLN: 4.0000 mg | Freq: Four times a day (QID) | INTRAMUSCULAR | Status: DC | PRN

## 2015-04-14 MED ORDER — ACETAMINOPHEN 325 MG PO TABS: 325.0000 mg | ORAL_TABLET | ORAL | Status: DC | PRN

## 2015-04-14 MED ORDER — FENTANYL CITRATE (PF) 100 MCG/2ML IJ SOLN
INTRAMUSCULAR | Status: DC | PRN
  Administered 2015-04-14: 50 ug via INTRAVENOUS

## 2015-04-14 MED ORDER — FENTANYL CITRATE (PF) 100 MCG/2ML IJ SOLN: 25.0000 ug | INTRAMUSCULAR | Status: DC | PRN

## 2015-04-14 MED ORDER — SODIUM CHLORIDE 0.9 % IV SOLN
INTRAVENOUS | Status: DC | PRN
  Administered 2015-04-14: 12:00:00 via INTRAVENOUS

## 2015-04-14 MED ORDER — MIDAZOLAM HCL 2 MG/2ML IJ SOLN
INTRAMUSCULAR | Status: DC | PRN
  Administered 2015-04-14: 1 mg via INTRAVENOUS

## 2015-04-14 MED ORDER — PROPOFOL 10 MG/ML IV BOLUS
INTRAVENOUS | Status: DC | PRN
  Administered 2015-04-14 (×3): 15 mg via INTRAVENOUS

## 2015-04-14 MED ORDER — SODIUM CHLORIDE 0.9 % IR SOLN
Status: DC | PRN
  Administered 2015-04-14: 225 mL

## 2015-04-14 SURGICAL SUPPLY — 34 items
BAG DECANTER FOR FLEXI CONT (MISCELLANEOUS) ×2 IMPLANT
BRUSH SCRUB 4% CHG (MISCELLANEOUS) ×2 IMPLANT
CABLE SURG 12 DISP A/V CHANNEL (MISCELLANEOUS) ×2 IMPLANT
CANISTER SUCT 1200ML W/VALVE (MISCELLANEOUS) ×2 IMPLANT
CHLORAPREP W/TINT 26ML (MISCELLANEOUS) ×2 IMPLANT
COVER LIGHT HANDLE STERIS (MISCELLANEOUS) ×4 IMPLANT
COVER MAYO STAND STRL (DRAPES) IMPLANT
DRAPE C-ARM XRAY 36X54 (DRAPES) ×2 IMPLANT
DRESSING TELFA 4X3 1S ST N-ADH (GAUZE/BANDAGES/DRESSINGS) ×2 IMPLANT
DRSG TEGADERM 4X4.75 (GAUZE/BANDAGES/DRESSINGS) ×2 IMPLANT
GLOVE BIO SURGEON STRL SZ7.5 (GLOVE) ×4 IMPLANT
GLOVE BIO SURGEON STRL SZ8 (GLOVE) ×4 IMPLANT
GOWN STRL REUS W/ TWL LRG LVL3 (GOWN DISPOSABLE) ×2 IMPLANT
GOWN STRL REUS W/ TWL XL LVL3 (GOWN DISPOSABLE) IMPLANT
GOWN STRL REUS W/TWL LRG LVL3 (GOWN DISPOSABLE) ×2
GOWN STRL REUS W/TWL XL LVL3 (GOWN DISPOSABLE)
IMMOBILIZER SHDR MD LX WHT (SOFTGOODS) ×2 IMPLANT
IMMOBILIZER SHDR XL LX WHT (SOFTGOODS) IMPLANT
INTRO PACEMAKR LEAD 9FR 13CM (INTRODUCER) ×2
INTRO PACEMKR SHEATH II 7FR (MISCELLANEOUS) ×2
INTRODUCER PACEMKR LD 9FR 13CM (INTRODUCER) ×1 IMPLANT
INTRODUCER PACEMKR SHTH II 7FR (MISCELLANEOUS) ×1 IMPLANT
IV NS 500ML (IV SOLUTION) ×1
IV NS 500ML BAXH (IV SOLUTION) ×1 IMPLANT
KIT RM TURNOVER STRD PROC AR (KITS) ×2 IMPLANT
LABEL OR SOLS (LABEL) ×2 IMPLANT
LEAD CAPSURE NOVUS 5076-52CM (Lead) ×2 IMPLANT
LEAD CAPSURE NOVUS 5076-58CM (Lead) ×2 IMPLANT
MARKER SKIN W/RULER 31145785 (MISCELLANEOUS) ×2 IMPLANT
PACK PACE INSERTION (MISCELLANEOUS) ×2 IMPLANT
PAD GROUND ADULT SPLIT (MISCELLANEOUS) ×2 IMPLANT
PAD STATPAD (MISCELLANEOUS) ×2 IMPLANT
PPM ADVISA MRI DR A2DR01 (Pacemaker) ×2 IMPLANT
SUT SILK 0 SH 30 (SUTURE) ×4 IMPLANT

## 2015-04-14 NOTE — Anesthesia Preprocedure Evaluation (Addendum)
Anesthesia Evaluation  Patient identified by MRN, date of birth, ID band Patient awake    Reviewed: Allergy & Precautions, NPO status , Patient's Chart, lab work & pertinent test results  History of Anesthesia Complications Negative for: history of anesthetic complications  Airway Mallampati: II  TM Distance: >3 FB Neck ROM: Full    Dental  (+) Edentulous Upper, Edentulous Lower   Pulmonary neg pulmonary ROS,           Cardiovascular hypertension, Pt. on medications + Past MI  + dysrhythmias Supra Ventricular Tachycardia      Neuro/Psych negative neurological ROS     GI/Hepatic negative GI ROS, Neg liver ROS,   Endo/Other  diabetes, Type 2, Oral Hypoglycemic Agents  Renal/GU negative Renal ROS     Musculoskeletal   Abdominal   Peds  Hematology negative hematology ROS (+)   Anesthesia Other Findings   Reproductive/Obstetrics                           Anesthesia Physical Anesthesia Plan  ASA: III and emergent  Anesthesia Plan: General   Post-op Pain Management:    Induction: Intravenous  Airway Management Planned: Nasal Cannula  Additional Equipment:   Intra-op Plan:   Post-operative Plan:   Informed Consent: I have reviewed the patients History and Physical, chart, labs and discussed the procedure including the risks, benefits and alternatives for the proposed anesthesia with the patient or authorized representative who has indicated his/her understanding and acceptance.     Plan Discussed with:   Anesthesia Plan Comments:        Anesthesia Quick Evaluation

## 2015-04-14 NOTE — Interval H&P Note (Signed)
History and Physical Interval Note:  04/14/2015 8:36 AM  Blake [name]  has presented today for surgery, with the diagnosis of A  The various methods of treatment have been discussed with the patient and family. After consideration of risks, benefits and other options for treatment, the patient has consented to  Procedure(s): INSERTION PACEMAKER (N/A) as a surgical intervention .  The patient's history has been reviewed, patient examined, no change in status, stable for surgery.  I have reviewed the patient's chart and labs.  Questions were answered to the patient's satisfaction.     Jaremy Nosal

## 2015-04-14 NOTE — Op Note (Signed)
[facility]   04/08/2015 - 04/14/2015                     1:34 PM  PATIENT:  Blake [name]    PRE-OPERATIVE DIAGNOSIS:  Sick sinus syndrome, bradycardia   POST-OPERATIVE DIAGNOSIS:  Same  PROCEDURE:  INSERTION PACEMAKER  SURGEON:  Azzure Garabedian, MD    ANESTHESIA:     PREOPERATIVE INDICATIONS:  Blake [name] is a  81 y.o. male with a diagnosis of Sick sinus syndrome, bradycardia  who failed conservative measures and elected for surgical management.    The risks benefits and alternatives were discussed with the patient preoperatively including but not limited to the risks of infection, bleeding, cardiopulmonary complications, the need for revision surgery, among others, and the patient was willing to proceed.   OPERATIVE PROCEDURE: The patient was brought to the operating room the fasting state. Her left pectoral region was prepped and draped in the usual sterile manner. 6 cm incision was performed of the left pectoral region. The pacemaker pocket was generated by electrocautery and blunt dissection. Access was obtained to left subclavian vein by fine needle aspiration. MRI compatible leads were positioned into the right ventricular apical septum and right atrial appendage under fluoroscopic guidance. After proper thresholds were obtained the leads were sutured in place. Pacemaker pocket was irrigated with gentamicin solution. The leads were connected to a MRI compatible dual-chamber rate responsive pacemaker generator ( [employer] A2 DRO 1) and  positioned into the pocket.  The pocket was closed with 2-0 and 4-0 Vicryl, respectively. Steri-Strips and a pressure dressing were applied.

## 2015-04-14 NOTE — H&P (View-Only) (Signed)
[facility] Cardiology  CARDIOLOGY CONSULT NOTE  Patient ID: KENNITH ABILA MRN: [id number] DOB/AGE: June 07, 1923 81 y.o.  Admit date: 04/08/2015 Referring Physician Ramonita Lab M.D. Primary Physician Ramonita Lab M.D. Primary Cardiologist Lujean Amel M.D. Reason for Consultation SVT  HPI: The patient is a 81 year old gentleman with history of paroxysmal atrial fibrillation, who is admitted following an episode of chest discomfort, found to have rapid narrow complex tachycardia by EMS, refractory to adenosine and, and Cardizem. Patient subsequently underwent successful cardioversion, initially to atrial fibrillation then sinus rhythm with first-degree AV block. The patient has remained in sinus rhythm overnight, currently chest pain-free. Patient did develop out elevation in troponin, with a peak value of 1.54 and the absence of chest pain or ECG ischemic changes.  Review of systems complete and found to be negative unless listed above     Past Medical History  Diagnosis Date  . Hypertension   . Diabetes mellitus without complication   . Hyperlipidemia   . Prostate cancer     History reviewed. No pertinent past surgical history.  Prescriptions prior to admission  Medication Sig Dispense Refill Last Dose  . amLODipine (NORVASC) 5 MG tablet Take 5 mg by mouth daily.   04/08/2015 at Unknown time  . azelastine (ASTELIN) 0.1 % nasal spray Place 1 spray into both nostrils 2 (two) times daily. Use in each nostril as directed   04/08/2015 at Unknown time  . clopidogrel (PLAVIX) 75 MG tablet Take 75 mg by mouth daily.   04/08/2015 at Unknown time  . finasteride (PROSCAR) 5 MG tablet Take 5 mg by mouth daily.   04/08/2015 at Unknown time  . glimepiride (AMARYL) 2 MG tablet Take 2-4 mg by mouth 2 (two) times daily. 2 tablets in the morning and one tablet at night   04/08/2015 at Unknown time  . metFORMIN (GLUCOPHAGE) 500 MG tablet Take 500-1,000 mg by mouth 2 (two) times daily with a meal. 1000mg  in the  morning and 500mg  in the evening     . Multiple Vitamins-Minerals (PRESERVISION/LUTEIN PO) Take 1 capsule by mouth 2 (two) times daily.   04/08/2015 at Unknown time  . ranitidine (ZANTAC) 150 MG tablet Take 150 mg by mouth 2 (two) times daily.   04/08/2015 at Unknown time  . rosuvastatin (CRESTOR) 5 MG tablet Take 5 mg by mouth at bedtime.   Past Week at Unknown time  . sotalol (BETAPACE) 80 MG tablet Take 80 mg by mouth 2 (two) times daily.   04/08/2015 at 1100  . tamsulosin (FLOMAX) 0.4 MG CAPS capsule Take 0.4 mg by mouth at bedtime.   Past Week at Unknown time  . triamcinolone cream (KENALOG) 0.5 % Apply 1 application topically 2 (two) times daily.   04/08/2015 at Unknown time   Social History   Social History  . Marital Status: Married    Spouse Name: N/A  . Number of Children: N/A  . Years of Education: N/A   Occupational History  . Not on file.   Social History Main Topics  . Smoking status: Never Smoker   . Smokeless tobacco: Not on file  . Alcohol Use: No  . Drug Use: No  . Sexual Activity: Not on file   Other Topics Concern  . Not on file   Social History Narrative  . No narrative on file    Family History  Problem Relation Age of Onset  . Hypertension Other   . Stroke Other       Review of systems  complete and found to be negative unless listed above      PHYSICAL EXAM  General: Well developed, well nourished, in no acute distress HEENT:  Normocephalic and atramatic Neck:  No JVD.  Lungs: Clear bilaterally to auscultation and percussion. Heart: HRRR . Normal S1 and S2 without gallops or murmurs.  Abdomen: Bowel sounds are positive, abdomen soft and non-tender  Msk:  Back normal, normal gait. Normal strength and tone for age. Extremities: No clubbing, cyanosis or edema.   Neuro: Alert and oriented X 3. Psych:  Good affect, responds appropriately  Labs:   Lab Results  Component Value Date   WBC 6.3 04/09/2015   HGB 10.8* 04/09/2015   HCT 32.0*  04/09/2015   MCV 86.7 04/09/2015   PLT 113* 04/09/2015    Recent Labs Lab 04/08/15 2234 04/09/15 0737  NA 133* 140  K 4.6 4.5  CL 100* 106  CO2 24 28  BUN 30* 26*  CREATININE 1.80* 1.40*  CALCIUM 8.8* 8.8*  PROT 7.1  --   BILITOT 0.6  --   ALKPHOS 71  --   ALT 12*  --   AST 24  --   GLUCOSE 233* 121*   Lab Results  Component Value Date   TROPONINI 1.54* 04/09/2015   No results found for: CHOL No results found for: HDL No results found for: LDLCALC No results found for: TRIG No results found for: CHOLHDL No results found for: LDLDIRECT    Radiology: Dg Chest Portable 1 View  04/08/2015   CLINICAL DATA:  Supra ventricular tachycardia status post cardioversion.  EXAM: PORTABLE CHEST 1 VIEW  COMPARISON:  Dec 05, 2012, June 16, 2014  FINDINGS: The heart size is enlarged. There is central pulmonary vascular congestion. There is no pleural effusion or focal pneumonia. The bones are unchanged.  IMPRESSION: Cardiomegaly with mild central pulmonary vascular congestion.   Electronically Signed   By: Abelardo Diesel M.D.   On: 04/08/2015 23:12    EKG: Initial rapid, narrow complex tachycardia with aberrancy, probable atrial flutter  ASSESSMENT AND PLAN:    1. Probable rapid atrial flutter, refractory to adenosine and Cardizem, converted to atrial fibrillation and sinus rhythm, currently clinically stable, without chest pain. 2. Mild elevation in troponin, in the setting of rapid tachycardia at a rate of 200 bpm, likely due to demand supply ischemia. 3. History of paroxysmal atrial fibrillation, chads Vasc of 3, currently on clear.-year-old, with history of aspirin allergy, and GI bleed secondary to Mallory-Weiss tear  Recommendations  1. Up titrate sotalol to 120 mg twice a day 2. Continue Plavix. Defer chronic anticoagulation in light of prior history of upper GI bleeding. 3. Refer to [facility] EP, for further evaluation, which can be done as outpatient. Patient could potentially       be a candidate for ablation. 4. Review 2-D echocardiogram   Signed: Jaaliyah Lucatero MD,PhD, [facility] 04/09/2015, 12:49 PM

## 2015-04-14 NOTE — Progress Notes (Signed)
PT Cancellation Note  Patient Details Name: Blake [name] MRN: [id number] DOB: 15-Jun-1923   Cancelled Treatment:    Reason Eval/Treat Not Completed: Medical issues which prohibited therapy (Patient scheduled for PPM placement this date (1130 per RN).  Requested new PT order as appropriate post-procedure; will attempt to see in PM as medically appropriate.)   Arden Tinoco H. Owens Shark, PT, DPT, NCS 04/14/2015, 10:15 AM [phone]

## 2015-04-14 NOTE — Anesthesia Postprocedure Evaluation (Signed)
  Anesthesia Post-op Note  Patient: Blake [name]  Procedure(s) Performed: Procedure(s): INSERTION PACEMAKER (Left)  Anesthesia type:General  Patient location: PACU  Post pain: Pain level controlled  Post assessment: Post-op Vital signs reviewed, Patient's Cardiovascular Status Stable, Respiratory Function Stable, Patent Airway and No signs of Nausea or vomiting  Post vital signs: Reviewed and stable  Last Vitals:  Filed Vitals:   04/14/15 1332  BP: 115/68  Pulse:   Temp: 36.6 C  Resp: 17    Level of consciousness: awake, alert  and patient cooperative  Complications: No apparent anesthesia complications

## 2015-04-14 NOTE — Progress Notes (Addendum)
Inpatient Diabetes Program Recommendations  AACE/ADA: New Consensus Statement on Inpatient Glycemic Control (2015)  Target Ranges:  Prepandial:   less than 140 mg/dL      Peak postprandial:   less than 180 mg/dL (1-2 hours)      Critically ill patients:  140 - 180 mg/dL   Review of Glycemic Control  Diabetes history: Type 2 Outpatient Diabetes medications: Amaryl 4mg  qam, 2mg  qpm, Metformin 1000mg  qam, 500mg  qpm Current orders for Inpatient glycemic control: Novolog 0-9 units tid with meals, novolog 0-5 units qhs   Inpatient Diabetes Program Recommendations:  Post prandial glucose elevated despite Novolog correction scale.  Once diet is resumed, please consider ordering Novolog 3 units TID with meals for meal coverage (in addition to Novolog correction).   Please consider ordering an A1C to evaluate glycemic control over the past 2-3 months. Patient is currently NPO. When diet is resumed, please order Carb Modified diet.  Gentry Fitz, RN, BA, MHA, CDE Diabetes Coordinator Inpatient Diabetes Program  [phone] (Team Pager) [phone] ([place]) 04/14/2015 11:00 AM

## 2015-04-14 NOTE — Progress Notes (Signed)
Patient ID: Blake [name], male   DOB: July 22, 1922, 81 y.o.   MRN: [id number] SUBJECTIVE:  Admitted with CP, nausea, with finding of SVT to 200 on arrival; ECG with narrow complex regular tachycardia. Required cardioversion.  Back on 80 mg sotalol; had had bradycardia with 120 mg.  HR remains in normal rhythm.  To go for PPM to enable further treatment of his initial tachycardia.  Voices no c/o this AM; no chest pain, dyspnea, palpitations.   ______________________________________________________________________  ROS: Please see HPI; remainder of complete 10 point ROS is negative   Past Medical History  Diagnosis Date  . Hypertension   . Diabetes mellitus without complication   . Hyperlipidemia   . Prostate cancer     History reviewed. No pertinent past surgical history.   Current facility-administered medications:  .  acetaminophen (TYLENOL) tablet 650 mg, 650 mg, Oral, Q6H PRN **OR** acetaminophen (TYLENOL) suppository 650 mg, 650 mg, Rectal, Q6H PRN, Lytle Butte, MD .  amLODipine (NORVASC) tablet 5 mg, 5 mg, Oral, Daily, Lytle Butte, MD, 5 mg at 04/13/15 0843 .  antiseptic oral rinse (CPC / CETYLPYRIDINIUM CHLORIDE 0.05%) solution 7 mL, 7 mL, Mouth Rinse, BID, [name], MD, 7 mL at 04/13/15 2200 .  aspirin EC tablet 81 mg, 81 mg, Oral, Daily, Lytle Butte, MD, 81 mg at 04/13/15 0844 .  azelastine (ASTELIN) 0.1 % nasal spray 1 spray, 1 spray, Each Nare, BID, Lytle Butte, MD, 1 spray at 04/13/15 2153 .  cefUROXime (ZINACEF) 1.5 g in dextrose 5 % 50 mL IVPB, 1.5 g, Intravenous, 60 min Pre-Op, Dwayne D Callwood, MD .  famotidine (PEPCID) tablet 20 mg, 20 mg, Oral, Daily, Lytle Butte, MD, 20 mg at 04/13/15 0842 .  finasteride (PROSCAR) tablet 5 mg, 5 mg, Oral, Daily, Lytle Butte, MD, 5 mg at 04/13/15 0843 .  Influenza vac split quadrivalent PF (FLUARIX) injection 0.5 mL, 0.5 mL, Intramuscular, Tomorrow-1000, [name], MD, 0.5 mL at 04/11/15 1000 .  insulin aspart  (novoLOG) injection 0-5 Units, 0-5 Units, Subcutaneous, QHS, Lytle Butte, MD, 2 Units at 04/13/15 2151 .  insulin aspart (novoLOG) injection 0-9 Units, 0-9 Units, Subcutaneous, TID WC, Lytle Butte, MD, 2 Units at 04/13/15 1711 .  morphine 2 MG/ML injection 2 mg, 2 mg, Intravenous, Q4H PRN, Lytle Butte, MD .  multivitamin-lutein [facility]) capsule 1 capsule, 1 capsule, Oral, BID, Lytle Butte, MD, 1 capsule at 04/13/15 2150 .  nitroGLYCERIN (NITROSTAT) SL tablet 0.4 mg, 0.4 mg, Sublingual, Q5 min PRN, Lytle Butte, MD .  ondansetron [facility]) tablet 4 mg, 4 mg, Oral, Q6H PRN **OR** ondansetron (ZOFRAN) injection 4 mg, 4 mg, Intravenous, Q6H PRN, Lytle Butte, MD .  oxyCODONE (Oxy IR/ROXICODONE) immediate release tablet 5 mg, 5 mg, Oral, Q4H PRN, Lytle Butte, MD .  rosuvastatin (CRESTOR) tablet 5 mg, 5 mg, Oral, QHS, Lytle Butte, MD, 5 mg at 04/13/15 2150 .  sodium chloride 0.9 % injection 3 mL, 3 mL, Intravenous, Q12H, Lytle Butte, MD, 3 mL at 04/13/15 2153 .  sotalol (BETAPACE) tablet 80 mg, 80 mg, Oral, Q12H, Dwayne D Callwood, MD, 80 mg at 04/13/15 2150 .  tamsulosin (FLOMAX) capsule 0.4 mg, 0.4 mg, Oral, QHS, Lytle Butte, MD, 0.4 mg at 04/13/15 2150 .  triamcinolone cream (KENALOG) 0.5 % 1 application, 1 application, Topical, BID, Lytle Butte, MD, 1 application at [id number]  PHYSICAL EXAM:  BP 113/69 mmHg  Pulse 70  Temp(Src) 97.8 F (36.6 C) (Oral)  Resp 18  Ht 6\' 2"  (1.88 m)  Wt 97.5 kg (214 lb 15.2 oz)  BMI 27.59 kg/m2  SpO2 96%  General: elderly  male, in NAD HEENT: PERRL; OP moist without lesions. Neck: supple, trachea midline, no thyromegaly Chest: normal to palpation Lungs: clear bilaterally without retractions or wheezes Cardiovascular: irr irr, no sig murmur; no gallop; distal pulses 2+ Abdomen: soft, nontender, nondistended, positive bowel sounds Extremities: no clubbing, cyanosis, edema.  Post op right knee Neuro: alert, moves all  extremities. Decreased vision Derm:  good skin turgor Lymph: no cervical or supraclavicular lymphadenopathy   ASSESSMENT/PLAN:   1. SVT/acute MI- appreciate cardiology; unable to tolerate higher dose sotalol without sig bradycardia and developed SVT while on 80 mg sotalol.  To PPM today to allow higher dose to prevent tachycardia. 2. DM with CKD stage 3- borderline control; off metformin due to renal fxn. Will cont to cover with SSI.  3. HTN- remains controlled with current meds; intolerant of ACE/ARB, hydralazine  4. Anemia/thrombocytopenia- off heparin due to worsening counts.  High risk of bleeding with full anticoagulation. should have adequate DVT prophyllaxis with current ASA, plavix.   5. D/c plan- dependent on timing of PPM; ideally would like to see him up with PT after this to make sure his mobility remains adequate to go home with [facility]

## 2015-04-14 NOTE — Progress Notes (Signed)
PT Cancellation Note  Patient Details Name: Blake [name] MRN: [id number] DOB: [birth year]   Cancelled Treatment:    Reason Eval/Treat Not Completed: Patient not medically ready (Pt arrived back to room late from surgery with general anesthesia. Will hold PT until AM to allow for full recovery. )   Milon Score 04/14/2015, 3:21 PM

## 2015-04-14 NOTE — Progress Notes (Signed)
A fib. Room air. Pacemaker inserted L chest. FS stable. Pt reports no pain. A & O. Family at the bedside. Urinal. Pt has no further concerns at this time.

## 2015-04-14 NOTE — Transfer of Care (Signed)
Immediate Anesthesia Transfer of Care Note  Patient: Blake [name]  Procedure(s) Performed: Procedure(s): INSERTION PACEMAKER (Left)  Patient Location: PACU  Anesthesia Type:MAC  Level of Consciousness: awake  Airway & Oxygen Therapy: Patient Spontanous Breathing and Patient connected to nasal cannula oxygen  Post-op Assessment: Report given to RN and Post -op Vital signs reviewed and stable  Post vital signs: Reviewed and stable  Last Vitals:  Filed Vitals:   04/14/15 1332  BP: 115/68  Pulse:   Temp: 36.6 C  Resp: 17    Complications: No apparent anesthesia complications

## 2015-04-14 NOTE — Progress Notes (Signed)
Inpatient Diabetes Program Recommendations  AACE/ADA: New Consensus Statement on Inpatient Glycemic Control (2015)  Target Ranges:  Prepandial:   less than 140 mg/dL      Peak postprandial:   less than 180 mg/dL (1-2 hours)      Critically ill patients:  140 - 180 mg/dL  Results for Blake [name], Blake [name] (MRN [id number]) as of 04/14/2015 10:58  Ref. Range 04/13/2015 07:21 04/13/2015 11:52 04/13/2015 16:16 04/13/2015 20:07 04/14/2015 07:26  Glucose-Capillary Latest Ref Range: 65-99 mg/dL 136 (H) 187 (H) 198 (H) 208 (H) 139 (H)   Review of Glycemic Control  Diabetes history: DM2 Outpatient Diabetes medications per home medication list: Amaryl 4 mg QAM, Amaryl 2 mg QPM, Metformin 1000 mg QAM, Metformin 500 mg QPM Current orders for Inpatient glycemic control: Novolog 0-9 units TID with meals, Novolog 0-5 units HS  Inpatient Diabetes Program Recommendations: Insulin - Meal Coverage: In reviewing chart, noted post prandial glucose elevated despite Novolog correction scale. Once diet is resumed, please consider ordering Novolog 3 units TID with meals for meal coverage (in addition to Novolog correction). HgbA1C: Last A1C in the chart was 7.1% on 12/03/2012. Please consider ordering an A1C to evaluate glycemic control over the past 2-3 months. Diet: Patient is currently NPO. When diet is resumed, please order Carb Modified diet.  Thanks, Barnie Alderman, RN, MSN, CCRN, CDE Diabetes Coordinator Inpatient Diabetes Program [phone] (Team Pager from [place] to [place]) [phone] ([place] office) [phone] [facility] office) [phone] [facility] office)

## 2015-04-15 LAB — GLUCOSE, CAPILLARY
GLUCOSE-CAPILLARY: 246 mg/dL — AB (ref 65–99)
Glucose-Capillary: 155 mg/dL — ABNORMAL HIGH (ref 65–99)
Glucose-Capillary: 142 mg/dL — ABNORMAL HIGH (ref 65–99)

## 2015-04-15 MED ORDER — CEPHALEXIN 250 MG PO CAPS
250.0000 mg | ORAL_CAPSULE | Freq: Four times a day (QID) | ORAL | Status: DC
  Filled 2015-04-15: qty 1

## 2015-04-15 MED ORDER — SOTALOL HCL 120 MG PO TABS: 120.0000 mg | ORAL_TABLET | Freq: Two times a day (BID) | ORAL | Status: DC

## 2015-04-15 MED ORDER — CEPHALEXIN 250 MG PO CAPS: 250.0000 mg | ORAL_CAPSULE | Freq: Four times a day (QID) | ORAL | Status: DC

## 2015-04-15 MED ORDER — GLIMEPIRIDE 2 MG PO TABS: ORAL_TABLET | ORAL | Status: DC

## 2015-04-15 NOTE — Care Management (Signed)
Patient to discharge today with home health.  Blake [name] with [facility] notified of discharge.  Patient already had rolling walker at home.  RNCM signing off

## 2015-04-15 NOTE — Progress Notes (Signed)
Patient ID: Blake [name], male   DOB: 25-Apr-1923, 81 y.o.   MRN: [id number] SUBJECTIVE:  Admitted with CP, nausea, with finding of SVT to 200 on arrival; ECG with narrow complex regular tachycardia. Required cardioversion.  Underwent PPM yesterday; no problems with this, though mobility a little more challenging.  Minimal discomfort at insertion site yesterday; no fever, chills, palpitations.  No other c/o this AM.     ______________________________________________________________________  ROS: Please see HPI; remainder of complete 10 point ROS is negative   Past Medical History  Diagnosis Date  . Hypertension   . Diabetes mellitus without complication ([place])   . Hyperlipidemia   . Prostate cancer [facility])     Past Surgical History  Procedure Laterality Date  . Pacemaker insertion Left 04/14/2015    Procedure: INSERTION PACEMAKER;  Surgeon: Isaias Cowman, MD;  Location: [facility] ORS;  Service: Cardiovascular;  Laterality: Left;     Current facility-administered medications:  .  acetaminophen (TYLENOL) tablet 650 mg, 650 mg, Oral, Q6H PRN **OR** acetaminophen (TYLENOL) suppository 650 mg, 650 mg, Rectal, Q6H PRN, Lytle Butte, MD .  acetaminophen (TYLENOL) tablet 325-650 mg, 325-650 mg, Oral, Q4H PRN, Isaias Cowman, MD .  amLODipine (NORVASC) tablet 5 mg, 5 mg, Oral, Daily, Lytle Butte, MD, 5 mg at 04/14/15 1452 .  antiseptic oral rinse (CPC / CETYLPYRIDINIUM CHLORIDE 0.05%) solution 7 mL, 7 mL, Mouth Rinse, BID, [name], MD, 7 mL at 04/14/15 2238 .  aspirin EC tablet 81 mg, 81 mg, Oral, Daily, Lytle Butte, MD, 81 mg at 04/14/15 1452 .  azelastine (ASTELIN) 0.1 % nasal spray 1 spray, 1 spray, Each Nare, BID, Lytle Butte, MD, 1 spray at 04/14/15 2238 .  famotidine (PEPCID) tablet 20 mg, 20 mg, Oral, Daily, Lytle Butte, MD, 20 mg at 04/14/15 1453 .  finasteride (PROSCAR) tablet 5 mg, 5 mg, Oral, Daily, Lytle Butte, MD, 5 mg at 04/14/15 1453 .  insulin aspart  (novoLOG) injection 0-5 Units, 0-5 Units, Subcutaneous, QHS, Lytle Butte, MD, 2 Units at 04/13/15 2151 .  insulin aspart (novoLOG) injection 0-9 Units, 0-9 Units, Subcutaneous, TID WC, Lytle Butte, MD, 2 Units at 04/14/15 1655 .  morphine 2 MG/ML injection 2 mg, 2 mg, Intravenous, Q4H PRN, Lytle Butte, MD .  multivitamin-lutein (OCUVITE-LUTEIN) capsule 1 capsule, 1 capsule, Oral, BID, Lytle Butte, MD, 1 capsule at 04/14/15 2237 .  nitroGLYCERIN (NITROSTAT) SL tablet 0.4 mg, 0.4 mg, Sublingual, Q5 min PRN, Lytle Butte, MD .  ondansetron [facility]) tablet 4 mg, 4 mg, Oral, Q6H PRN **OR** ondansetron (ZOFRAN) injection 4 mg, 4 mg, Intravenous, Q6H PRN, Lytle Butte, MD .  oxyCODONE (Oxy IR/ROXICODONE) immediate release tablet 5 mg, 5 mg, Oral, Q4H PRN, Lytle Butte, MD .  rosuvastatin (CRESTOR) tablet 5 mg, 5 mg, Oral, QHS, Lytle Butte, MD, 5 mg at 04/14/15 2237 .  sodium chloride 0.9 % injection 3 mL, 3 mL, Intravenous, Q12H, Lytle Butte, MD, 3 mL at 04/15/15 0500 .  sotalol (BETAPACE) tablet 80 mg, 80 mg, Oral, Q12H, Dwayne D Callwood, MD, 80 mg at 04/14/15 2237 .  tamsulosin (FLOMAX) capsule 0.4 mg, 0.4 mg, Oral, QHS, Lytle Butte, MD, 0.4 mg at 04/14/15 2237 .  triamcinolone cream (KENALOG) 0.5 % 1 application, 1 application, Topical, BID, Lytle Butte, MD, 1 application at [id number]  PHYSICAL EXAM:  BP 106/61 mmHg  Pulse 59  Temp(Src) 98.2 F (36.8  C) (Oral)  Resp 18  Ht 6\' 2"  (1.88 m)  Wt 97.5 kg (214 lb 15.2 oz)  BMI 27.59 kg/m2  SpO2 94%  General: elderly  male, in NAD HEENT: PERRL; OP moist without lesions. Neck: supple, trachea midline, no thyromegaly Chest: PPM left chest wall; minimal drainage on dressing Lungs: clear bilaterally without retractions or wheezes Cardiovascular: irr irr, no gallop; distal pulses 2+ Abdomen: soft, nontender, nondistended, positive bowel sounds Extremities: no clubbing, cyanosis, edema.  Post op right knee Neuro: alert, moves  all extremities. Decreased vision, chronic Derm:  good skin turgor Lymph: no cervical or supraclavicular lymphadenopathy   ASSESSMENT/PLAN:   1. SVT/acute MI- appreciate cardiology; now s/p PPM.  Defer to cardiology whether to go back to sotalol 120 mg; may consider doing this as outpt.   2. DM with CKD stage 3-  off metformin due to renal fxn. covering with SSI.  Will need close f/u as outpt to adjust regimen 3. HTN- stable with current meds; intolerant of ACE/ARB, hydralazine  4. Anemia/thrombocytopenia- off heparin due to worsening counts.  High risk of bleeding with full anticoagulation for afib, with prior GI bleed. should have adequate DVT prophyllaxis with current ASA, plavix.   5. D/c plan-  up with PT this AM to make sure his mobility remains adequate to go home with [facility]; will revisit at lunch hr to determine.

## 2015-04-15 NOTE — Progress Notes (Signed)
Subjective:   postop day x 1 since permanent pacemaker left axilla patient is doing real  While patient denies any pain no weakness no palpitations or tachycardia. Ambulated well with physical therapy feels fine.  Objective:  Vital Signs in the last 24 hours: Temp:  [97.7 F (36.5 C)-98.3 F (36.8 C)] 98.2 F (36.8 C) (10/04 1310) Pulse Rate:  [47-82] 58 (10/04 1310) Resp:  [17-23] 18 (10/04 1310) BP: (97-119)/(60-74) 100/60 mmHg (10/04 1310) SpO2:  [94 %-100 %] 98 % (10/04 1310)  Intake/Output from previous day: 10/03 0701 - 10/04 0700 In: 543 [P.O.:240; I.V.:153; IV Piggyback:150] Out: 1025 [Urine:1025] Intake/Output from this shift: Total I/O In: 240 [P.O.:240] Out: 325 [Urine:325]  Physical Exam: General appearance: cooperative and appears stated age Neck: no adenopathy, no carotid bruit, no JVD, supple, symmetrical, trachea midline and thyroid not enlarged, symmetric, no tenderness/mass/nodules Lungs: clear to auscultation bilaterally Heart: regular rate and rhythm Abdomen: soft, non-tender; bowel sounds normal; no masses,  no organomegaly Extremities: extremities normal, atraumatic, no cyanosis or edema Pulses: 2+ and symmetric Skin: Skin color, texture, turgor normal. No rashes or lesions Neurologic: Alert and oriented X 3, normal strength and tone. Normal symmetric reflexes. Normal coordination and gait  Lab Results: No results for input(s): WBC, HGB, PLT in the last 72 hours. No results for input(s): NA, K, CL, CO2, GLUCOSE, BUN, CREATININE in the last 72 hours. No results for input(s): TROPONINI in the last 72 hours.  Invalid input(s): CK, MB Hepatic Function Panel No results for input(s): PROT, ALBUMIN, AST, ALT, ALKPHOS, BILITOT, BILIDIR, IBILI in the last 72 hours. No results for input(s): CHOL in the last 72 hours. No results for input(s): PROTIME in the last 72 hours.  Imaging: Imaging results have been reviewed  Cardiac Studies:  Assessment/Plan:   Arrhythmia Atrial Fibrillation Palpitations Shortness of Breath Syncope   bradycardia  sick sinus syndrome  postop day 1 permanent pacemaker  diabetes  SVT  hypertension  demand ischemia elevated troponin . PLAN  patient should be okay to be discharged home today with follow-up with Cardiology once 2 weeks  advanced sotalol to 120 mg twice a day  Keflex 250 mg 4 times a day for 10 days  continue lipid management with Crestor  blood pressure control with amlodipine  continue reflux therapy with Pepcid consider omeprazole  diabetes management medically  have the patient follow-up as an outpatient for pacemaker interrogation and follow-up cardiology  case discussed with family as well as the primary physician  LOS: 6 days    CALLWOOD,DWAYNE D. 04/15/2015, 1:16 PM

## 2015-04-15 NOTE — Evaluation (Signed)
Physical Therapy Re-Evaluation Patient Details Name: Blake [name] MRN: [id number] DOB: 1922/09/04 Today's Date: 04/15/2015   History of Present Illness  The patient is a 81 year old gentleman with history of paroxysmal atrial fibrillation, who is admitted following an episode of chest discomfort, found to have rapid narrow complex tachycardia by EMS, refractory to adenosine and, and Cardizem. Patient subsequently underwent successful cardioversion, initially to atrial fibrillation then sinus rhythm with first-degree AV block. The patient has remained in sinus rhythm overnight, currently chest pain-free. Patient did develop out elevation in troponin, with a peak value of 1.54 and the absence of chest pain or ECG ischemic changes.  Now, status post PPM placement (10/3) with order for PT re-evaluation.  Clinical Impression  Upon evaluation, patient alert and oriented, follows all commands and demonstrates good safety awareness/insight.  Bilat UE/LE strength and ROM grossly WFL and symmetrical (except L shoulder elevation limited to shoulder height secondary to PPM).  No pain reported.  Currently requiring min assist for bed mobility; cga for sit/stand, basic transfers and gait (220') with RW.  No overt buckling, LOB or safety concern.  Fair/good adherence to PPM movement precautions with functional activities.  Vitals stable and WFL throughout session (HR peak at 79bpm with exertion); no reports of chest pain, SOB, palpitations. Would benefit from skilled PT to address above deficits and promote optimal return to PLOF; Recommend transition to [place] upon discharge from acute hospitalization.     Follow Up Recommendations Home health PT    Equipment Recommendations  Rolling walker with 5" wheels    Recommendations for Other Services       Precautions / Restrictions Precautions Precautions: Fall Precaution Comments: PPM Restrictions Weight Bearing Restrictions: No      Mobility  Bed  Mobility Overal bed mobility: Needs Assistance Bed Mobility: Supine to Sit     Supine to sit: Min assist     General bed mobility comments: transition towards R to minimize pushing/pulling through L UE  Transfers Overall transfer level: Needs assistance Equipment used: Rolling walker (2 wheeled) Transfers: Sit to/from Stand Sit to Stand: Min guard;Min assist         General transfer comment: increased time required to complete  Ambulation/Gait Ambulation/Gait assistance: Min guard Ambulation Distance (Feet): 220 Feet Assistive device: Rolling walker (2 wheeled)   Gait velocity: 1.0 ft/sec (10' walk time, 10 seconds)   General Gait Details: reciprocal stepping pattern with fair step height/length; forward flexed posture at trunk, hips and knees; no overt buckling/LOB.  Decreased gait speed, but no significant safety concerns.  HR stable and WFL throughout; no reports of chest pain, SOB.  Stairs            Wheelchair Mobility    Modified Rankin (Stroke Patients Only)       Balance Overall balance assessment: Needs assistance Sitting-balance support: No upper extremity supported;Feet supported Sitting balance-Leahy Scale: Normal     Standing balance support: Bilateral upper extremity supported Standing balance-Leahy Scale: Fair                               Pertinent Vitals/Pain Pain Assessment: No/denies pain    Home Living Family/patient expects to be discharged to:: Private residence Living Arrangements: Spouse/significant other Available Help at Discharge: Family Type of Home: House         Home Equipment: Environmental consultant - 4 wheels;Cane - single point      Prior Function Level of Independence: Independent  with assistive device(s)         Comments: Pt used SPC for community ambulation/ walking over uneven surfaces outside; also used a rollator for household ambulation     Hand Dominance        Extremity/Trunk Assessment   Upper  Extremity Assessment: Overall WFL for tasks assessed (L LE elevation limited to shoulder height secondary to PPM)           Lower Extremity Assessment: Overall WFL for tasks assessed         Communication   Communication: No difficulties  Cognition Arousal/Alertness: Awake/alert Behavior During Therapy: WFL for tasks assessed/performed Overall Cognitive Status: Within Functional Limits for tasks assessed                      General Comments General comments (skin integrity, edema, etc.): L chest PPM insertion site covered with dressing, mild drainage noted throughout dressing    Exercises Other Exercises Other Exercises: Educated patient/family regarding PPM precautions: no L UE elevation overhead, no pushing/pulling through L UE; both voiced understanding. Other Exercises: Additional sit/stand from bed, recliner with RW, cga--increased time to complete.  Good adherence to cuing for minimal use of L UE with movement transitions.      Assessment/Plan    PT Assessment Patient needs continued PT services  PT Diagnosis Generalized weakness;Difficulty walking   PT Problem List Decreased activity tolerance;Decreased balance;Decreased mobility;Decreased range of motion;Decreased safety awareness;Decreased knowledge of precautions;Cardiopulmonary status limiting activity  PT Treatment Interventions Gait training;Functional mobility training;Therapeutic activities;Therapeutic exercise;Balance training;Stair training;DME instruction;Patient/family education   PT Goals (Current goals can be found in the Care Plan section) Acute Rehab PT Goals Patient Stated Goal: Pt wants to be able to move around like he was before his hospitalization PT Goal Formulation: With patient Time For Goal Achievement: 04/29/15 Potential to Achieve Goals: Good    Frequency Min 2X/week   Barriers to discharge        Co-evaluation               End of Session Equipment Utilized During  Treatment: Gait belt Activity Tolerance: Patient tolerated treatment well Patient left: in chair;with call bell/phone within reach;with chair alarm set;with family/visitor present           Time: [id number] PT Time Calculation (min) (ACUTE ONLY): 21 min   Charges:   PT Evaluation $PT Re-evaluation: 1 Procedure PT Treatments $Therapeutic Activity: 8-22 mins   PT G Codes:        Capricia Serda H. Owens Shark, PT, DPT, NCS 04/15/2015, 9:59 AM [phone]

## 2015-04-15 NOTE — Progress Notes (Signed)
Discharge instructions explained to pts family/ verbalized an understanding/ iv and tele removed/ rx given to pt/ transported off unit via wheelchair.

## 2015-05-09 DIAGNOSIS — Z45018 Encounter for adjustment and management of other part of cardiac pacemaker: Secondary | ICD-10-CM | POA: Insufficient documentation

## 2015-07-24 DIAGNOSIS — D696 Thrombocytopenia, unspecified: Secondary | ICD-10-CM | POA: Insufficient documentation

## 2015-08-13 DIAGNOSIS — D414 Neoplasm of uncertain behavior of bladder: Secondary | ICD-10-CM

## 2015-09-17 DIAGNOSIS — R339 Retention of urine, unspecified: Secondary | ICD-10-CM | POA: Insufficient documentation

## 2015-09-26 DIAGNOSIS — C678 Malignant neoplasm of overlapping sites of bladder: Secondary | ICD-10-CM

## 2015-10-30 DIAGNOSIS — N183 Chronic kidney disease, stage 3 unspecified: Secondary | ICD-10-CM | POA: Insufficient documentation

## 2015-10-30 DIAGNOSIS — I495 Sick sinus syndrome: Secondary | ICD-10-CM | POA: Insufficient documentation

## 2015-10-30 DIAGNOSIS — R4189 Other symptoms and signs involving cognitive functions and awareness: Secondary | ICD-10-CM | POA: Insufficient documentation

## 2016-03-01 DIAGNOSIS — H90A31 Mixed conductive and sensorineural hearing loss, unilateral, right ear with restricted hearing on the contralateral side: Secondary | ICD-10-CM | POA: Insufficient documentation

## 2016-03-01 DIAGNOSIS — H9112 Presbycusis, left ear: Secondary | ICD-10-CM | POA: Insufficient documentation

## 2016-03-26 DIAGNOSIS — H7201 Central perforation of tympanic membrane, right ear: Secondary | ICD-10-CM | POA: Insufficient documentation

## 2016-03-31 ENCOUNTER — Encounter (INDEPENDENT_AMBULATORY_CARE_PROVIDER_SITE_OTHER): Payer: Self-pay

## 2016-03-31 DIAGNOSIS — I4891 Unspecified atrial fibrillation: Secondary | ICD-10-CM | POA: Insufficient documentation

## 2016-03-31 DIAGNOSIS — E785 Hyperlipidemia, unspecified: Secondary | ICD-10-CM | POA: Insufficient documentation

## 2016-03-31 DIAGNOSIS — C61 Malignant neoplasm of prostate: Secondary | ICD-10-CM | POA: Insufficient documentation

## 2016-03-31 DIAGNOSIS — M1990 Unspecified osteoarthritis, unspecified site: Secondary | ICD-10-CM | POA: Insufficient documentation

## 2016-04-23 ENCOUNTER — Other Ambulatory Visit (INDEPENDENT_AMBULATORY_CARE_PROVIDER_SITE_OTHER): Payer: Self-pay | Admitting: Vascular Surgery

## 2016-04-23 DIAGNOSIS — I714 Abdominal aortic aneurysm, without rupture, unspecified: Secondary | ICD-10-CM

## 2016-04-27 ENCOUNTER — Ambulatory Visit (INDEPENDENT_AMBULATORY_CARE_PROVIDER_SITE_OTHER): Payer: Medicare Other

## 2016-04-27 ENCOUNTER — Encounter (INDEPENDENT_AMBULATORY_CARE_PROVIDER_SITE_OTHER): Payer: Self-pay | Admitting: Vascular Surgery

## 2016-04-27 ENCOUNTER — Ambulatory Visit (INDEPENDENT_AMBULATORY_CARE_PROVIDER_SITE_OTHER): Payer: Medicare Other | Admitting: Vascular Surgery

## 2016-04-27 VITALS — BP 145/83 | HR 83 | Resp 16 | Ht 74.0 in | Wt 218.0 lb

## 2016-04-27 DIAGNOSIS — I714 Abdominal aortic aneurysm, without rupture, unspecified: Secondary | ICD-10-CM

## 2016-04-27 DIAGNOSIS — I10 Essential (primary) hypertension: Secondary | ICD-10-CM | POA: Diagnosis not present

## 2016-04-27 DIAGNOSIS — I4891 Unspecified atrial fibrillation: Secondary | ICD-10-CM

## 2016-04-27 DIAGNOSIS — E118 Type 2 diabetes mellitus with unspecified complications: Secondary | ICD-10-CM

## 2016-04-27 DIAGNOSIS — E785 Hyperlipidemia, unspecified: Secondary | ICD-10-CM

## 2016-04-27 NOTE — Assessment & Plan Note (Signed)
blood pressure control important in reducing the progression of atherosclerotic disease and aneurysmal disease. On appropriate oral medications.  

## 2016-04-27 NOTE — Assessment & Plan Note (Signed)
lipid control important in reducing the progression of atherosclerotic disease. Continue statin therapy  

## 2016-04-27 NOTE — Progress Notes (Signed)
MRN : [id number]  Blake [name] is a 82 y.o. (April 15, 1923) male who presents with chief complaint of  Chief Complaint  Patient presents with  . Re-evaluation    Ultrasound follow up  .  History of Present Illness: Patient returns today in follow up of His abdominal aortic aneurysm. He well and denies any aneurysm related symptoms. Specifically, the patient denies new back or abdominal pain, or signs of peripheral embolization His aortic duplex today shows a stable 4.95 cm in maximal diameter infrarenal abdominal aortic aneurysm that has not changed from his study 5 months ago.  Current Outpatient Prescriptions  Medication Sig Dispense Refill  . amLODipine (NORVASC) 5 MG tablet Take 5 mg by mouth daily.    Marland Kitchen azelastine (ASTELIN) 0.1 % nasal spray Place 1 spray into both nostrils 2 (two) times daily. Use in each nostril as directed    . cephALEXin (KEFLEX) 250 MG capsule Take 1 capsule (250 mg total) by mouth 4 (four) times daily. 40 capsule 0  . clopidogrel (PLAVIX) 75 MG tablet Take 75 mg by mouth daily.    . finasteride (PROSCAR) 5 MG tablet Take 5 mg by mouth daily.    Marland Kitchen glimepiride (AMARYL) 2 MG tablet 2 tablets in the morning and one tablet at night 90 tablet 11  . Multiple Vitamins-Minerals (PRESERVISION/LUTEIN PO) Take 1 capsule by mouth 2 (two) times daily.    . ranitidine (ZANTAC) 150 MG tablet Take 150 mg by mouth 2 (two) times daily.    . rosuvastatin (CRESTOR) 5 MG tablet Take 5 mg by mouth at bedtime.    . sotalol (BETAPACE) 120 MG tablet Take 1 tablet (120 mg total) by mouth every 12 (twelve) hours. 60 tablet 11  . tamsulosin (FLOMAX) 0.4 MG CAPS capsule Take 0.4 mg by mouth at bedtime.    . triamcinolone cream (KENALOG) 0.5 % Apply 1 application topically 2 (two) times daily.     No current facility-administered medications for this visit.     Past Medical History:  Diagnosis Date  . AAA (abdominal aortic aneurysm) ([place])   . Diabetes mellitus without complication  ([place])   . Heart murmur   . Hyperlipidemia   . Hypertension   . Prostate cancer [facility])     Past Surgical History:  Procedure Laterality Date  . JOINT REPLACEMENT    . PACEMAKER INSERTION Left 04/14/2015   Procedure: INSERTION PACEMAKER;  Surgeon: Isaias Cowman, MD;  Location: [facility] ORS;  Service: Cardiovascular;  Laterality: Left;  . PROSTATE SURGERY      Social History Social History  Substance Use Topics  . Smoking status: Never Smoker  . Smokeless tobacco: Never Used  . Alcohol use No     Family History Family History  Problem Relation Age of Onset  . Hypertension Other   . Stroke Other   . Heart attack Other      Allergies  Allergen Reactions  . Diovan [Valsartan] Cough  . Hydralazine Itching  . Lisinopril Cough     REVIEW OF SYSTEMS (Negative unless checked)  Constitutional: [] Weight loss  [] Fever  [] Chills Cardiac: [] Chest pain   [] Chest pressure   [] Palpitations   [] Shortness of breath when laying flat   [] Shortness of breath at rest   [] Shortness of breath with exertion. Vascular:  [] Pain in legs with walking   [] Pain in legs at rest   [] Pain in legs when laying flat   [] Claudication   [] Pain in feet when walking  [] Pain in feet  at rest  [] Pain in feet when laying flat   [] History of DVT   [] Phlebitis   [] Swelling in legs   [] Varicose veins   [] Non-healing ulcers Pulmonary:   [] Uses home oxygen   [] Productive cough   [] Hemoptysis   [] Wheeze  [] COPD   [] Asthma Neurologic:  [] Dizziness  [] Blackouts   [] Seizures   [] History of stroke   [] History of TIA  [] Aphasia   [] Temporary blindness   [] Dysphagia   [] Weakness or numbness in arms   [] Weakness or numbness in legs Musculoskeletal:  [x] Arthritis   [] Joint swelling   [x] Joint pain   [] Low back pain Hematologic:  [] Easy bruising  [] Easy bleeding   [] Hypercoagulable state   [] Anemic   Gastrointestinal:  [] Blood in stool   [] Vomiting blood  [] Gastroesophageal reflux/heartburn   [] Abdominal pain Genitourinary:   [] Chronic kidney disease   [] Difficult urination  [] Frequent urination  [] Burning with urination   [] Hematuria Skin:  [] Rashes   [] Ulcers   [] Wounds Psychological:  [] History of anxiety   []  History of major depression.  Physical Examination  BP (!) 145/83   Pulse 83   Resp 16   Ht 6\' 2"  (1.88 m)   Wt 218 lb (98.9 kg)   BMI 27.99 kg/m  Gen:  WD/WN, NAD. Appears younger than stated age Head: [place]/AT, No temporalis wasting. Ear/Nose/Throat: Hearing grossly intact, nares w/o erythema or drainage, trachea midline Eyes: PERRLA, EOMI. Sclera non-icteric Neck: Supple, no nuchal rigidity.  No JVD.  Pulmonary:  Good air movement, no use of accessory muscles.  Cardiac: RRR, normal S1, S2 Vascular:  Vessel Right Left  Radial Palpable Palpable  Ulnar Palpable Palpable  Brachial Palpable Palpable  Carotid Palpable, without bruit Palpable, without bruit  Aorta Slightly enlarged and palpable N/A  Femoral Palpable Palpable  Popliteal Palpable Palpable  PT Palpable Palpable  DP Palpable Palpable   Gastrointestinal: soft, non-tender/non-distended. No guarding/reflex.  Musculoskeletal: M/S 5/5 throughout.  No deformity or atrophy. Minimal bilateral lower extremity edema. Walks with a cane Neurologic: CN 2-12 intact. Pain and light touch intact in extremities.  Symmetrical.  Speech is fluent.  Psychiatric: Judgment intact, Mood & affect appropriate for pt's clinical situation. Dermatologic: No rashes or ulcers noted.  No cellulitis or open wounds. Lymph : No Cervical, Axillary, or Inguinal lymphadenopathy.      Labs No results found for this or any previous visit (from the past 2160 hour(s)).  Radiology No results found.   Assessment/Plan  Diabetes mellitus, type 2 (HCC) blood glucose control important in reducing the progression of atherosclerotic disease. Also, involved in wound healing. On appropriate medications.   Benign hypertension blood pressure control important in  reducing the progression of atherosclerotic disease and aneurysmal disease. On appropriate oral medications.   HLD (hyperlipidemia) lipid control important in reducing the progression of atherosclerotic disease. Continue statin therapy   AAA (abdominal aortic aneurysm) without rupture (HCC) The patient has an asymptomatic 4.95 cm abdominal aortic aneurysm on duplex today that has not changed from his study 5 months ago. He is doing well. Particularly given his advanced age, I will defer repair and plan to see him back in 4-6 months in follow-up. Continue blood pressure control is very important. He will contact my office with any changes or problems in the interim.    Leotis Pain, MD  04/27/2016 9:25 AM    This note was created with Dragon medical transcription system.  Any errors from dictation are purely unintentional

## 2016-04-27 NOTE — Assessment & Plan Note (Signed)
The patient has an asymptomatic 4.95 cm abdominal aortic aneurysm on duplex today that has not changed from his study 5 months ago. He is doing well. Particularly given his advanced age, I will defer repair and plan to see him back in 4-6 months in follow-up. Continue blood pressure control is very important. He will contact my office with any changes or problems in the interim.

## 2016-04-27 NOTE — Assessment & Plan Note (Signed)
blood glucose control important in reducing the progression of atherosclerotic disease. Also, involved in wound healing. On appropriate medications.  

## 2016-04-27 NOTE — Patient Instructions (Signed)
Abdominal Aortic Aneurysm An aneurysm is a weakened or damaged part of an artery wall that bulges from the normal force of blood pumping through the body. An abdominal aortic aneurysm is an aneurysm that occurs in the lower part of the aorta, the main artery of the body.  The major concern with an abdominal aortic aneurysm is that it can enlarge and burst (rupture) or blood can flow between the layers of the wall of the aorta through a tear (aorticdissection). Both of these conditions can cause bleeding inside the body and can be life threatening unless diagnosed and treated promptly. CAUSES  The exact cause of an abdominal aortic aneurysm is unknown. Some contributing factors are:   A hardening of the arteries caused by the buildup of fat and other substances in the lining of a blood vessel (arteriosclerosis).  Inflammation of the walls of an artery (arteritis).   Connective tissue diseases, such as Marfan syndrome.   Abdominal trauma.   An infection, such as syphilis or staphylococcus, in the wall of the aorta (infectious aortitis) caused by bacteria. RISK FACTORS  Risk factors that contribute to an abdominal aortic aneurysm may include:  Age older than 60 years.   High blood pressure (hypertension).  Male gender.  Ethnicity (white race).  Obesity.  Family history of aneurysm (first degree relatives only).  Tobacco use. PREVENTION  The following healthy lifestyle habits may help decrease your risk of abdominal aortic aneurysm:  Quitting smoking. Smoking can raise your blood pressure and cause arteriosclerosis.  Limiting or avoiding alcohol.  Keeping your blood pressure, blood sugar level, and cholesterol levels within normal limits.  Decreasing your salt intake. In somepeople, too much salt can raise blood pressure and increase your risk of abdominal aortic aneurysm.  Eating a diet low in saturated fats and cholesterol.  Increasing your fiber intake by including  whole grains, vegetables, and fruits in your diet. Eating these foods may help lower blood pressure.  Maintaining a healthy weight.  Staying physically active and exercising regularly. SYMPTOMS  The symptoms of abdominal aortic aneurysm may vary depending on the size and rate of growth of the aneurysm.Most grow slowly and do not have any symptoms. When symptoms do occur, they may include:  Pain (abdomen, side, lower back, or groin). The pain may vary in intensity. A sudden onset of severe pain may indicate that the aneurysm has ruptured.  Feeling full after eating only small amounts of food.  Nausea or vomiting or both.  Feeling a pulsating lump in the abdomen.  Feeling faint or passing out. DIAGNOSIS  Since most unruptured abdominal aortic aneurysms have no symptoms, they are often discovered during diagnostic exams for other conditions. An aneurysm may be found during the following procedures:  Ultrasonography (A one-time screening for abdominal aortic aneurysm by ultrasonography is also recommended for all men aged 65-75 years who have ever smoked).  X-ray exams.  A computed tomography (CT).  Magnetic resonance imaging (MRI).  Angiography or arteriography. TREATMENT  Treatment of an abdominal aortic aneurysm depends on the size of your aneurysm, your age, and risk factors for rupture. Medication to control blood pressure and pain may be used to manage aneurysms smaller than 6 cm. Regular monitoring for enlargement may be recommended by your caregiver if:  The aneurysm is 3-4 cm in size (an annual ultrasonography may be recommended).  The aneurysm is 4-4.5 cm in size (an ultrasonography every 6 months may be recommended).  The aneurysm is larger than 4.5 cm in   size (your caregiver may ask that you be examined by a vascular surgeon). If your aneurysm is larger than 6 cm, surgical repair may be recommended. There are two main methods for repair of an aneurysm:   Endovascular  repair (a minimally invasive surgery). This is done most often.  Open repair. This method is used if an endovascular repair is not possible.   This information is not intended to replace advice given to you by your health care provider. Make sure you discuss any questions you have with your health care provider.   Document Released: 04/07/2005 Document Revised: 10/23/2012 Document Reviewed: 07/28/2012 Elsevier Interactive Patient Education 2016 [employer]  

## 2016-07-13 ENCOUNTER — Other Ambulatory Visit: Payer: Self-pay | Admitting: Physical Medicine and Rehabilitation

## 2016-07-13 DIAGNOSIS — M5416 Radiculopathy, lumbar region: Secondary | ICD-10-CM

## 2016-07-16 ENCOUNTER — Ambulatory Visit
Admission: RE | Admit: 2016-07-16 | Discharge: 2016-07-16 | Disposition: A | Discharge status: Home / Self Care | Payer: Medicare Other | Source: Ambulatory Visit | Attending: Physical Medicine and Rehabilitation | Admitting: Physical Medicine and Rehabilitation

## 2016-07-16 DIAGNOSIS — I700 Atherosclerosis of aorta: Secondary | ICD-10-CM | POA: Insufficient documentation

## 2016-07-16 DIAGNOSIS — I714 Abdominal aortic aneurysm, without rupture: Secondary | ICD-10-CM | POA: Diagnosis not present

## 2016-07-16 DIAGNOSIS — M5416 Radiculopathy, lumbar region: Secondary | ICD-10-CM | POA: Diagnosis present

## 2016-07-16 DIAGNOSIS — M4316 Spondylolisthesis, lumbar region: Secondary | ICD-10-CM | POA: Insufficient documentation

## 2016-07-16 DIAGNOSIS — M5116 Intervertebral disc disorders with radiculopathy, lumbar region: Secondary | ICD-10-CM | POA: Diagnosis not present

## 2016-07-16 DIAGNOSIS — N134 Hydroureter: Secondary | ICD-10-CM | POA: Insufficient documentation

## 2016-07-16 DIAGNOSIS — N200 Calculus of kidney: Secondary | ICD-10-CM | POA: Diagnosis not present

## 2016-07-16 DIAGNOSIS — N1330 Unspecified hydronephrosis: Secondary | ICD-10-CM | POA: Insufficient documentation

## 2016-07-16 DIAGNOSIS — M48061 Spinal stenosis, lumbar region without neurogenic claudication: Secondary | ICD-10-CM | POA: Insufficient documentation

## 2016-07-19 DIAGNOSIS — M48062 Spinal stenosis, lumbar region with neurogenic claudication: Secondary | ICD-10-CM | POA: Insufficient documentation

## 2016-07-19 DIAGNOSIS — N1330 Unspecified hydronephrosis: Secondary | ICD-10-CM | POA: Insufficient documentation

## 2016-09-24 ENCOUNTER — Other Ambulatory Visit (INDEPENDENT_AMBULATORY_CARE_PROVIDER_SITE_OTHER): Payer: Medicare Other

## 2016-09-24 ENCOUNTER — Ambulatory Visit (INDEPENDENT_AMBULATORY_CARE_PROVIDER_SITE_OTHER): Payer: Medicare Other | Admitting: Vascular Surgery

## 2016-10-09 ENCOUNTER — Emergency Department: Payer: Medicare Other

## 2016-10-09 ENCOUNTER — Encounter: Payer: Self-pay | Admitting: Emergency Medicine

## 2016-10-09 ENCOUNTER — Inpatient Hospital Stay: Payer: Medicare Other

## 2016-10-09 ENCOUNTER — Inpatient Hospital Stay
Admission: EM | Admit: 2016-10-09 | Discharge: 2016-10-13 | DRG: 177 | Disposition: A | Discharge status: Home Health | Payer: Medicare Other | Attending: Internal Medicine | Admitting: Internal Medicine

## 2016-10-09 DIAGNOSIS — I714 Abdominal aortic aneurysm, without rupture: Secondary | ICD-10-CM | POA: Diagnosis present

## 2016-10-09 DIAGNOSIS — G500 Trigeminal neuralgia: Secondary | ICD-10-CM | POA: Diagnosis present

## 2016-10-09 DIAGNOSIS — I4581 Long QT syndrome: Secondary | ICD-10-CM | POA: Diagnosis present

## 2016-10-09 DIAGNOSIS — N179 Acute kidney failure, unspecified: Secondary | ICD-10-CM | POA: Diagnosis present

## 2016-10-09 DIAGNOSIS — E785 Hyperlipidemia, unspecified: Secondary | ICD-10-CM | POA: Diagnosis present

## 2016-10-09 DIAGNOSIS — J9601 Acute respiratory failure with hypoxia: Secondary | ICD-10-CM | POA: Diagnosis present

## 2016-10-09 DIAGNOSIS — Z87891 Personal history of nicotine dependence: Secondary | ICD-10-CM | POA: Diagnosis not present

## 2016-10-09 DIAGNOSIS — Z7984 Long term (current) use of oral hypoglycemic drugs: Secondary | ICD-10-CM | POA: Diagnosis not present

## 2016-10-09 DIAGNOSIS — Z79899 Other long term (current) drug therapy: Secondary | ICD-10-CM

## 2016-10-09 DIAGNOSIS — I4510 Unspecified right bundle-branch block: Secondary | ICD-10-CM | POA: Diagnosis present

## 2016-10-09 DIAGNOSIS — Z888 Allergy status to other drugs, medicaments and biological substances status: Secondary | ICD-10-CM

## 2016-10-09 DIAGNOSIS — R55 Syncope and collapse: Secondary | ICD-10-CM

## 2016-10-09 DIAGNOSIS — J189 Pneumonia, unspecified organism: Secondary | ICD-10-CM

## 2016-10-09 DIAGNOSIS — I480 Paroxysmal atrial fibrillation: Secondary | ICD-10-CM | POA: Diagnosis present

## 2016-10-09 DIAGNOSIS — Z7901 Long term (current) use of anticoagulants: Secondary | ICD-10-CM

## 2016-10-09 DIAGNOSIS — N189 Chronic kidney disease, unspecified: Secondary | ICD-10-CM

## 2016-10-09 DIAGNOSIS — Z950 Presence of cardiac pacemaker: Secondary | ICD-10-CM | POA: Diagnosis not present

## 2016-10-09 DIAGNOSIS — E860 Dehydration: Secondary | ICD-10-CM | POA: Diagnosis present

## 2016-10-09 DIAGNOSIS — N183 Chronic kidney disease, stage 3 (moderate): Secondary | ICD-10-CM | POA: Diagnosis present

## 2016-10-09 DIAGNOSIS — J690 Pneumonitis due to inhalation of food and vomit: Principal | ICD-10-CM | POA: Diagnosis present

## 2016-10-09 DIAGNOSIS — I959 Hypotension, unspecified: Secondary | ICD-10-CM | POA: Diagnosis present

## 2016-10-09 DIAGNOSIS — Z7902 Long term (current) use of antithrombotics/antiplatelets: Secondary | ICD-10-CM

## 2016-10-09 DIAGNOSIS — J680 Bronchitis and pneumonitis due to chemicals, gases, fumes and vapors: Secondary | ICD-10-CM | POA: Diagnosis present

## 2016-10-09 DIAGNOSIS — Z8546 Personal history of malignant neoplasm of prostate: Secondary | ICD-10-CM

## 2016-10-09 DIAGNOSIS — I129 Hypertensive chronic kidney disease with stage 1 through stage 4 chronic kidney disease, or unspecified chronic kidney disease: Secondary | ICD-10-CM | POA: Diagnosis present

## 2016-10-09 DIAGNOSIS — J9600 Acute respiratory failure, unspecified whether with hypoxia or hypercapnia: Secondary | ICD-10-CM | POA: Diagnosis present

## 2016-10-09 DIAGNOSIS — I495 Sick sinus syndrome: Secondary | ICD-10-CM | POA: Diagnosis present

## 2016-10-09 DIAGNOSIS — E1122 Type 2 diabetes mellitus with diabetic chronic kidney disease: Secondary | ICD-10-CM | POA: Diagnosis present

## 2016-10-09 HISTORY — DX: Cardiac arrhythmia, unspecified: I49.9

## 2016-10-09 LAB — BLOOD GAS, ARTERIAL
ACID-BASE DEFICIT: 6 mmol/L — AB (ref 0.0–2.0)
Acid-base deficit: 3.3 mmol/L — ABNORMAL HIGH (ref 0.0–2.0)
BICARBONATE: 19.6 mmol/L — AB (ref 20.0–28.0)
Bicarbonate: 21.3 mmol/L (ref 20.0–28.0)
DELIVERY SYSTEMS: POSITIVE
Delivery systems: POSITIVE
EXPIRATORY PAP: 5
Expiratory PAP: 5
FIO2: 0.5
FIO2: 0.65
INSPIRATORY PAP: 10
Inspiratory PAP: 10
O2 SAT: 82.3 %
O2 Saturation: 95.6 %
PATIENT TEMPERATURE: 37
PCO2 ART: 38 mmHg (ref 32.0–48.0)
PCO2 ART: 36 mmHg (ref 32.0–48.0)
PH ART: 7.32 — AB (ref 7.350–7.450)
PH ART: 7.38 (ref 7.350–7.450)
Patient temperature: 37
pO2, Arterial: 51 mmHg — ABNORMAL LOW (ref 83.0–108.0)
pO2, Arterial: 81 mmHg — ABNORMAL LOW (ref 83.0–108.0)

## 2016-10-09 LAB — BASIC METABOLIC PANEL
Anion gap: 9 (ref 5–15)
Anion gap: 14 (ref 5–15)
BUN: 31 mg/dL — AB (ref 6–20)
BUN: 30 mg/dL — AB (ref 6–20)
CHLORIDE: 105 mmol/L (ref 101–111)
CO2: 24 mmol/L (ref 22–32)
CO2: 22 mmol/L (ref 22–32)
CREATININE: 2.1 mg/dL — AB (ref 0.61–1.24)
Calcium: 8.4 mg/dL — ABNORMAL LOW (ref 8.9–10.3)
Calcium: 7.6 mg/dL — ABNORMAL LOW (ref 8.9–10.3)
Chloride: 97 mmol/L — ABNORMAL LOW (ref 101–111)
Creatinine, Ser: 2.5 mg/dL — ABNORMAL HIGH (ref 0.61–1.24)
GFR calc Af Amer: 24 mL/min — ABNORMAL LOW (ref >60)
GFR calc Af Amer: 30 mL/min — ABNORMAL LOW (ref >60)
GFR calc non Af Amer: 21 mL/min — ABNORMAL LOW (ref >60)
GFR, EST NON AFRICAN AMERICAN: 26 mL/min — AB (ref >60)
GLUCOSE: 181 mg/dL — AB (ref 65–99)
GLUCOSE: 410 mg/dL — AB (ref 65–99)
POTASSIUM: 3.7 mmol/L (ref 3.5–5.1)
Potassium: 3.8 mmol/L (ref 3.5–5.1)
SODIUM: 133 mmol/L — AB (ref 135–145)
Sodium: 138 mmol/L (ref 135–145)

## 2016-10-09 LAB — CBC
HCT: 30.3 % — ABNORMAL LOW (ref 40.0–52.0)
Hemoglobin: 10 g/dL — ABNORMAL LOW (ref 13.0–18.0)
MCH: 27.4 pg (ref 26.0–34.0)
MCHC: 33.1 g/dL (ref 32.0–36.0)
MCV: 82.9 fL (ref 80.0–100.0)
PLATELETS: 139 10*3/uL — AB (ref 150–440)
RBC: 3.66 MIL/uL — ABNORMAL LOW (ref 4.40–5.90)
RDW: 16.3 % — AB (ref 11.5–14.5)
WBC: 9.2 10*3/uL (ref 3.8–10.6)

## 2016-10-09 LAB — TROPONIN I: Troponin I: <0.03 ng/mL (ref <0.03)

## 2016-10-09 LAB — GLUCOSE, CAPILLARY: Glucose-Capillary: 210 mg/dL — ABNORMAL HIGH (ref 65–99)

## 2016-10-09 MED ORDER — TAMSULOSIN HCL 0.4 MG PO CAPS
0.4000 mg | ORAL_CAPSULE | Freq: Every day | ORAL | Status: DC
  Administered 2016-10-10 – 2016-10-13 (×4): 0.4 mg via ORAL
  Filled 2016-10-09 (×5): qty 1

## 2016-10-09 MED ORDER — SODIUM CHLORIDE 0.9 % IV SOLN
INTRAVENOUS | Status: DC
  Administered 2016-10-09 – 2016-10-13 (×5): via INTRAVENOUS

## 2016-10-09 MED ORDER — FAMOTIDINE 20 MG PO TABS
20.0000 mg | ORAL_TABLET | Freq: Every day | ORAL | Status: DC
  Administered 2016-10-10 – 2016-10-13 (×4): 20 mg via ORAL
  Filled 2016-10-09 (×4): qty 1

## 2016-10-09 MED ORDER — DOCUSATE SODIUM 100 MG PO CAPS
100.0000 mg | ORAL_CAPSULE | Freq: Two times a day (BID) | ORAL | Status: DC | PRN
  Administered 2016-10-13: 09:00:00 100 mg via ORAL
  Filled 2016-10-09: qty 1

## 2016-10-09 MED ORDER — AMOXICILLIN-POT CLAVULANATE 875-125 MG PO TABS: 1.0000 | ORAL_TABLET | Freq: Once | ORAL | Status: DC

## 2016-10-09 MED ORDER — SODIUM CHLORIDE 0.9 % IV BOLUS (SEPSIS)
1000.0000 mL | Freq: Once | INTRAVENOUS | Status: AC
  Administered 2016-10-09: 1000 mL via INTRAVENOUS

## 2016-10-09 MED ORDER — METOPROLOL TARTRATE 5 MG/5ML IV SOLN
INTRAVENOUS | Status: AC
  Filled 2016-10-09: qty 5

## 2016-10-09 MED ORDER — AZELASTINE HCL 0.1 % NA SOLN
1.0000 | Freq: Two times a day (BID) | NASAL | Status: DC
  Administered 2016-10-10 – 2016-10-13 (×8): 1 via NASAL
  Filled 2016-10-09: qty 30

## 2016-10-09 MED ORDER — AMLODIPINE BESYLATE 5 MG PO TABS
5.0000 mg | ORAL_TABLET | Freq: Every day | ORAL | Status: DC
  Administered 2016-10-11 – 2016-10-13 (×3): 5 mg via ORAL
  Filled 2016-10-09 (×4): qty 1

## 2016-10-09 MED ORDER — CEFTRIAXONE SODIUM-DEXTROSE 1-3.74 GM-% IV SOLR
1.0000 g | Freq: Once | INTRAVENOUS | Status: AC
  Administered 2016-10-09: 1 g via INTRAVENOUS
  Filled 2016-10-09: qty 50

## 2016-10-09 MED ORDER — ONDANSETRON HCL 4 MG/2ML IJ SOLN
INTRAMUSCULAR | Status: AC
  Filled 2016-10-09: qty 2

## 2016-10-09 MED ORDER — PIPERACILLIN-TAZOBACTAM 3.375 G IVPB
3.3750 g | Freq: Three times a day (TID) | INTRAVENOUS | Status: DC
  Administered 2016-10-09 – 2016-10-11 (×6): 3.375 g via INTRAVENOUS
  Filled 2016-10-09 (×9): qty 50

## 2016-10-09 MED ORDER — TRAMADOL HCL 50 MG PO TABS: 50.0000 mg | ORAL_TABLET | Freq: Four times a day (QID) | ORAL | Status: DC | PRN

## 2016-10-09 MED ORDER — SOTALOL HCL 120 MG PO TABS
120.0000 mg | ORAL_TABLET | Freq: Two times a day (BID) | ORAL | Status: DC
  Filled 2016-10-09 (×2): qty 1

## 2016-10-09 MED ORDER — METOPROLOL TARTRATE 5 MG/5ML IV SOLN: 5.0000 mg | Freq: Once | INTRAVENOUS | Status: DC

## 2016-10-09 MED ORDER — DEXTROSE 5 % IV SOLN: 1.0000 g | Freq: Once | INTRAVENOUS | Status: DC

## 2016-10-09 MED ORDER — AMIODARONE HCL 200 MG PO TABS: 200.0000 mg | ORAL_TABLET | Freq: Every day | ORAL | Status: DC

## 2016-10-09 MED ORDER — OCUVITE-LUTEIN PO CAPS
1.0000 | ORAL_CAPSULE | Freq: Two times a day (BID) | ORAL | Status: DC
  Administered 2016-10-10 – 2016-10-13 (×8): 1 via ORAL
  Filled 2016-10-09 (×8): qty 1

## 2016-10-09 MED ORDER — APIXABAN 5 MG PO TABS
5.0000 mg | ORAL_TABLET | Freq: Two times a day (BID) | ORAL | Status: DC
  Administered 2016-10-10 (×2): 5 mg via ORAL
  Filled 2016-10-09 (×2): qty 1

## 2016-10-09 MED ORDER — ORAL CARE MOUTH RINSE: 15.0000 mL | Freq: Two times a day (BID) | OROMUCOSAL | Status: DC

## 2016-10-09 MED ORDER — CLOPIDOGREL BISULFATE 75 MG PO TABS
75.0000 mg | ORAL_TABLET | Freq: Every day | ORAL | Status: DC
  Administered 2016-10-10: 75 mg via ORAL
  Filled 2016-10-09: qty 1

## 2016-10-09 MED ORDER — ONDANSETRON HCL 4 MG/2ML IJ SOLN
4.0000 mg | Freq: Once | INTRAMUSCULAR | Status: AC
  Administered 2016-10-09: 4 mg via INTRAVENOUS

## 2016-10-09 MED ORDER — LINAGLIPTIN 5 MG PO TABS
5.0000 mg | ORAL_TABLET | Freq: Every day | ORAL | Status: DC
  Administered 2016-10-10 – 2016-10-13 (×4): 5 mg via ORAL
  Filled 2016-10-09 (×4): qty 1

## 2016-10-09 MED ORDER — FINASTERIDE 5 MG PO TABS
5.0000 mg | ORAL_TABLET | Freq: Every day | ORAL | Status: DC
  Administered 2016-10-10 – 2016-10-13 (×4): 5 mg via ORAL
  Filled 2016-10-09 (×4): qty 1

## 2016-10-09 MED ORDER — AZITHROMYCIN 500 MG PO TABS
500.0000 mg | ORAL_TABLET | Freq: Once | ORAL | Status: AC
  Administered 2016-10-09: 500 mg via ORAL
  Filled 2016-10-09: qty 1

## 2016-10-09 MED ORDER — INSULIN ASPART 100 UNIT/ML ~~LOC~~ SOLN
0.0000 [IU] | Freq: Three times a day (TID) | SUBCUTANEOUS | Status: DC
  Administered 2016-10-10: 2 [IU] via SUBCUTANEOUS
  Administered 2016-10-10: 3 [IU] via SUBCUTANEOUS
  Administered 2016-10-10: 2 [IU] via SUBCUTANEOUS
  Administered 2016-10-11: 3 [IU] via SUBCUTANEOUS
  Administered 2016-10-11: 08:00:00 1 [IU] via SUBCUTANEOUS
  Administered 2016-10-11: 18:00:00 2 [IU] via SUBCUTANEOUS
  Administered 2016-10-12: 1 [IU] via SUBCUTANEOUS
  Administered 2016-10-12: 3 [IU] via SUBCUTANEOUS
  Administered 2016-10-12: 09:00:00 1 [IU] via SUBCUTANEOUS
  Administered 2016-10-13: 2 [IU] via SUBCUTANEOUS
  Administered 2016-10-13: 09:00:00 1 [IU] via SUBCUTANEOUS
  Filled 2016-10-09 (×3): qty 2
  Filled 2016-10-09: qty 3
  Filled 2016-10-09 (×2): qty 1
  Filled 2016-10-09: qty 3
  Filled 2016-10-09 (×2): qty 1
  Filled 2016-10-09: qty 3

## 2016-10-09 MED ORDER — CHLORHEXIDINE GLUCONATE 0.12 % MT SOLN
15.0000 mL | Freq: Two times a day (BID) | OROMUCOSAL | Status: DC
  Administered 2016-10-10 – 2016-10-13 (×8): 15 mL via OROMUCOSAL
  Filled 2016-10-09 (×8): qty 15

## 2016-10-09 MED ORDER — ROSUVASTATIN CALCIUM 5 MG PO TABS
5.0000 mg | ORAL_TABLET | Freq: Every day | ORAL | Status: DC
  Administered 2016-10-10 – 2016-10-12 (×4): 5 mg via ORAL
  Filled 2016-10-09 (×4): qty 1

## 2016-10-09 NOTE — ED Notes (Signed)
Hold metoprolol at this time per EDP.

## 2016-10-09 NOTE — ED Provider Notes (Signed)
[facility] Emergency Department Provider Note  ____________________________________________  Time seen: Approximately 5:42 PM  I have reviewed the triage vital signs and the nursing notes.   HISTORY  Chief Complaint Near Syncope   HPI Blake [name] is a 83 y.o. male with a history of prostate cancer remission, sick sinus syndrome status post Medtronic pacemaker, AAA, hypertension, and hyperlipidemia who presents for evaluation of a near syncopal episode. History is gathered mostly from patient's son and wife. According to them the entire family was sitting on the porch for about an hour under the hot sun. They all felt hot and wanted to go inside for a drink. Patient walked to the mailbox and when he came back inside he looked pale and diaphoretic. Had a few episodes of nonbloody nonbilious emesis and had a near syncopal episode. When EMS arrived patient was hypotensive with systolics in the 70Y. Patient reports that he feels better at this time. According to the family patient only had a cup of coffee today and has not had any other drinks. Family was concerned the patient had an aspiration event or is dehydrated as he had a similar episode a few years ago. Patient denies HA, CP, palpitations, SOB, abdominal pain, back pain preceding or after this episode. Patient was in his usual state of health until this happened. Family denies any h/o PE/DVT, recent travel or immobilization. He has had asymmetric swelling of his legs that son noticed 1 month ago.  Past Medical History:  Diagnosis Date  . AAA (abdominal aortic aneurysm) ([place])   . Diabetes mellitus without complication ([place])   . Heart murmur   . Hyperlipidemia   . Hypertension   . Prostate cancer [facility])     Patient Active Problem List   Diagnosis Date Noted  . AAA (abdominal aortic aneurysm) without rupture ([place]) 04/27/2016  . Arthritis, degenerative 03/31/2016  . A-fib ([place]) 03/31/2016  . HLD  (hyperlipidemia) 03/31/2016  . CA of prostate ([place]) 03/31/2016  . SVT (supraventricular tachycardia) ([place]) 04/09/2015  . Diabetes mellitus, type 2 ([place]) 04/09/2015  . Benign hypertension 04/09/2015  . Acute myocardial infarction, initial episode of care 04/09/2015    Past Surgical History:  Procedure Laterality Date  . JOINT REPLACEMENT    . PACEMAKER INSERTION Left 04/14/2015   Procedure: INSERTION PACEMAKER;  Surgeon: Isaias Cowman, MD;  Location: [facility] ORS;  Service: Cardiovascular;  Laterality: Left;  . PROSTATE SURGERY      Prior to Admission medications   Medication Sig Start Date End Date Taking? Authorizing Provider  amLODipine (NORVASC) 5 MG tablet Take 5 mg by mouth daily.    Historical Provider, MD  azelastine (ASTELIN) 0.1 % nasal spray Place 1 spray into both nostrils 2 (two) times daily. Use in each nostril as directed    Historical Provider, MD  cephALEXin (KEFLEX) 250 MG capsule Take 1 capsule (250 mg total) by mouth 4 (four) times daily. 04/15/15   [name], MD  clopidogrel (PLAVIX) 75 MG tablet Take 75 mg by mouth daily.    Historical Provider, MD  finasteride (PROSCAR) 5 MG tablet Take 5 mg by mouth daily.    Historical Provider, MD  glimepiride (AMARYL) 2 MG tablet 2 tablets in the morning and one tablet at night 04/15/15   [name], MD  Multiple Vitamins-Minerals (PRESERVISION/LUTEIN PO) Take 1 capsule by mouth 2 (two) times daily.    Historical Provider, MD  ranitidine (ZANTAC) 150 MG tablet Take 150 mg  by mouth 2 (two) times daily.    Historical Provider, MD  rosuvastatin (CRESTOR) 5 MG tablet Take 5 mg by mouth at bedtime.    Historical Provider, MD  sotalol (BETAPACE) 120 MG tablet Take 1 tablet (120 mg total) by mouth every 12 (twelve) hours. 04/15/15   [name], MD  tamsulosin (FLOMAX) 0.4 MG CAPS capsule Take 0.4 mg by mouth at bedtime.    Historical Provider, MD  triamcinolone cream (KENALOG) 0.5 % Apply 1 application topically 2  (two) times daily.    Historical Provider, MD    Allergies Diovan [valsartan]; Hydralazine; and Lisinopril  Family History  Problem Relation Age of Onset  . Hypertension Other   . Stroke Other   . Heart attack Other     Social History Social History  Substance Use Topics  . Smoking status: Former Research scientist (life sciences)  . Smokeless tobacco: Never Used  . Alcohol use No    Review of Systems  Constitutional: Negative for fever. + near syncope Eyes: Negative for visual changes. ENT: Negative for sore throat. Neck: No neck pain  Cardiovascular: Negative for chest pain. Respiratory: Negative for shortness of breath. Gastrointestinal: Negative for abdominal pain,  diarrhea. + vomiting Genitourinary: Negative for dysuria. Musculoskeletal: Negative for back pain. Skin: Negative for rash. Neurological: Negative for headaches, weakness or numbness. Psych: No SI or HI  ____________________________________________   PHYSICAL EXAM:  VITAL SIGNS: ED Triage Vitals [10/09/16 1654]  Enc Vitals Group     BP 117/70     Pulse Rate 65     Resp (!) 21     Temp 98.5 F (36.9 C)     Temp Source Oral     SpO2 (!) 85 %     Weight 218 lb (98.9 kg)     Height 6\' 2"  (1.88 m)     Head Circumference      Peak Flow      Pain Score      Pain Loc      Pain Edu?      Excl. in [place]?     Constitutional: Alert and oriented. Well appearing and in no apparent distress. HEENT:      Head: Normocephalic and atraumatic.         Eyes: Conjunctivae are normal. Sclera is non-icteric. EOMI. PERRL      Mouth/Throat: Mucous membranes are moist.       Neck: Supple with no signs of meningismus. Cardiovascular: Regular rate and rhythm. No murmurs, gallops, or rubs. 2+ symmetrical distal pulses are present in all extremities. No JVD. Respiratory: Normal respiratory effort. Lungs are clear to auscultation bilaterally and diminished on the R base. Hypoxic on RA. No wheezes, crackles, or rhonchi.  Gastrointestinal: Soft,  non tender, and non distended with positive bowel sounds. No rebound or guarding. Musculoskeletal: Trace edema on b/l LE with R>L Neurologic: Normal speech and language. Face is symmetric. Moving all extremities. No gross focal neurologic deficits are appreciated. Skin: Skin is warm, dry and intact. No rash noted. Psychiatric: Mood and affect are normal. Speech and behavior are normal.  ____________________________________________   LABS (all labs ordered are listed, but only abnormal results are displayed)  Labs Reviewed  BASIC METABOLIC PANEL - Abnormal; Notable for the following:       Result Value   Glucose, Bld 181 (*)    BUN 31 (*)    Creatinine, Ser 2.50 (*)    Calcium 8.4 (*)    GFR calc non Af Wy[place]nia Lora  21 (*)    GFR calc Af Amer 24 (*)    All other components within normal limits  CBC - Abnormal; Notable for the following:    RBC 3.66 (*)    Hemoglobin 10.0 (*)    HCT 30.3 (*)    RDW 16.3 (*)    Platelets 139 (*)    All other components within normal limits  TROPONIN I  URINALYSIS, COMPLETE (UACMP) WITH MICROSCOPIC  TROPONIN I  BASIC METABOLIC PANEL   ____________________________________________  EKG  ED ECG REPORT I, Rudene Re, the attending physician, personally viewed and interpreted this ECG.  Normal sinus rhythm, rate of 67, prolonged QTC, right bundle branch block, left axis deviation, no ST elevations or depressions. Unchanged from prior. ____________________________________________  RADIOLOGY  CXR: Right basilar pneumonia noted.   Doppler: No evidence of deep venous thrombosis. ____________________________________________   PROCEDURES  Procedure(s) performed: None Procedures Critical Care performed: yes  CRITICAL CARE Performed by: Rudene Re  ?  Total critical care time: 40 min  Critical care time was exclusive of separately billable procedures and treating other patients.  Critical care was necessary to treat or prevent  imminent or life-threatening deterioration.  Critical care was time spent personally by me on the following activities: development of treatment plan with patient and/or surrogate as well as nursing, discussions with consultants, evaluation of patient's response to treatment, examination of patient, obtaining history from patient or surrogate, ordering and performing treatments and interventions, ordering and review of laboratory studies, ordering and review of radiographic studies, pulse oximetry and re-evaluation of patient's condition.  ____________________________________________   INITIAL IMPRESSION / ASSESSMENT AND PLAN / ED COURSE  84 y.o. male with a history of prostate cancer remission, sick sinus syndrome status post Medtronic pacemaker, AAA, hypertension, and hyperlipidemia who presents for evaluation of a near syncopal episode in the setting of possible dehydration. Patient also with new oxygen requirement as he is hypoxic to 88% with diminished breath sounds on the right base which could be consistent with an aspiration event. Less likely a PE since patient is on Eliquis however with asymmetric leg swelling and hypoxia I will do Doppler studies on his lower extremities to rule out potential source of a PE. Unfortunately patient cannot undergo a CT of his chest due to acute on chronic kidney injury. If Dopplers are negative on a patient with Eliquis I will assume that is extremely unlikely that this patient has a PE. CXR pending to evaluate for aspiration. Will interrogate pacemaker to rule out arrhythmia. Will give IVF for dehydration and AoCKD.  Clinical Course as of Oct 09 1913  Sat Oct 09, 2016  1911 Chest x-ray concerning for right lower lobe pneumonia. Patient is on 3 L nasal cannula and continues to desat to 89%. Also with acute on chronic kidney injury. Therefore patient be admitted to the hospitalist service. Interrogation of the pacemaker shows no arrhythmias. Doppler studies with  no evidence of DVT.   [CV]    Clinical Course User Index [CV] Rudene Re, MD    Pertinent labs & imaging results that were available during my care of the patient were reviewed by me and considered in my medical decision making (see chart for details).    ____________________________________________   FINAL CLINICAL IMPRESSION(S) / ED DIAGNOSES  Final diagnoses:  Near syncope  Acute respiratory failure with hypoxia (HCC)  Acute renal failure superimposed on chronic kidney disease, unspecified CKD stage, unspecified acute renal failure type ([place])  NEW MEDICATIONS STARTED DURING THIS VISIT:  New Prescriptions   No medications on file     Note:  This document was prepared using Dragon voice recognition software and may include unintentional dictation errors.    Rudene Re, MD 10/09/16 [phone]

## 2016-10-09 NOTE — Consult Note (Signed)
PULMONARY / CRITICAL CARE MEDICINE   Name: Blake [name] MRN: [id number] DOB: September 23, 1922    ADMISSION DATE:  10/09/2016   CONSULTATION DATE:  10/09/2016  REFERRING MD:  Dr. Anselm Jungling  CHIEF COMPLAINT: per family, patient almost "went out" while sitting on the porch  HISTORY OF PRESENT ILLNESS:   This is a a 83 y/o male with a PMH of AAA, T2DM, cardiac sick sinus syndrome s/p pacemaker, hypertension and hyperlipidemia who presented to the ED following a near-syncopal episode and vomiting x2 at home. History is obtained from ED records and from family as patient is now on BiPAP. Per patient's family, patient had been coughing for 2 days. Today while sitting on the porch he almost passed out hence family called EMS. Per family, emesis was most clear liquids and undigested food. When EMS arrived, patient was hypotensive and hypoxic with blood pressure of 84/40 and SPO2 in the 80s on RA. At the ED, he had another episode of emsesishis CXR showed RLL a infiltrate suggestive of aspiration pneumonitis. His respiratory status decompensated and he was placed on BiPAP. PCCM was consulted for further management.    He is followed by Dr. Clayborn Bigness for SSS. Last 2-D echo was in September 2016.  PAST MEDICAL HISTORY :  He  has a past medical history of AAA (abdominal aortic aneurysm) ([place]); Arrhythmia; Diabetes mellitus without complication ([place]); Heart murmur; Hyperlipidemia; Hypertension; and Prostate cancer ([place]).  PAST SURGICAL HISTORY: He  has a past surgical history that includes Pacemaker insertion (Left, 04/14/2015); Prostate surgery; and Joint replacement.  Allergies  Allergen Reactions  . Diovan [Valsartan] Cough  . Hydralazine Itching  . Lisinopril Cough    No current facility-administered medications on file prior to encounter.    Current Outpatient Prescriptions on File Prior to Encounter  Medication Sig  . azelastine (ASTELIN) 0.1 % nasal spray Place 1 spray into both nostrils 2 (two)  times daily. Use in each nostril as directed  . finasteride (PROSCAR) 5 MG tablet Take 5 mg by mouth daily.  Marland Kitchen glimepiride (AMARYL) 2 MG tablet 2 tablets in the morning and one tablet at night (Patient taking differently: 4 mg. )  . Multiple Vitamins-Minerals (PRESERVISION/LUTEIN PO) Take 1 capsule by mouth 2 (two) times daily.  . ranitidine (ZANTAC) 150 MG tablet Take 150 mg by mouth 2 (two) times daily.  . rosuvastatin (CRESTOR) 5 MG tablet Take 5 mg by mouth at bedtime.  . tamsulosin (FLOMAX) 0.4 MG CAPS capsule Take 0.4 mg by mouth at bedtime.  Marland Kitchen amLODipine (NORVASC) 5 MG tablet Take 5 mg by mouth daily.  . cephALEXin (KEFLEX) 250 MG capsule Take 1 capsule (250 mg total) by mouth 4 (four) times daily. (Patient not taking: Reported on 10/09/2016)  . clopidogrel (PLAVIX) 75 MG tablet Take 75 mg by mouth daily.  . sotalol (BETAPACE) 120 MG tablet Take 1 tablet (120 mg total) by mouth every 12 (twelve) hours. (Patient not taking: Reported on 10/09/2016)  . triamcinolone cream (KENALOG) 0.5 % Apply 1 application topically 2 (two) times daily.    FAMILY HISTORY:  His indicated that the status of his sister is unknown. He indicated that the status of his brother is unknown. He indicated that the status of his other is unknown.    SOCIAL HISTORY: He  reports that he has quit smoking. He has never used smokeless tobacco. He reports that he does not drink alcohol or use drugs.  REVIEW OF SYSTEMS:   Unable to obtain as patient  is on BiPAP   SUBJECTIVE:    VITAL SIGNS: BP (!) 157/80   Pulse (!) 107   Temp 97.7 F (36.5 C) (Oral)   Resp (!) 26   Ht 6\' 2"  (1.88 m)   Wt 218 lb (98.9 kg)   SpO2 100%   BMI 27.99 kg/m   HEMODYNAMICS:    VENTILATOR SETTINGS:    INTAKE / OUTPUT: No intake/output data recorded.  PHYSICAL EXAMINATION: General: appropriate for age, NAD Neuro:  AAO X2, speech is normal, moves all extremities HEENT: [place]/AT, PERRLA, oral mucosa dry Cardiovascular:RRR,  S1/S2, No MRG Lungs: Normal WOB, normal breath sounds, diminished in the bases, rales in RLLL Abdomen:  +bs x4, non-distended Musculoskeletal:  +rom Skin: warm and dry  LABS:  BMET  Recent Labs Lab 10/09/16 1653 10/09/16 1959  NA 138 133*  K 3.7 3.8  CL 105 97*  CO2 24 22  BUN 31* 30*  CREATININE 2.50* 2.10*  GLUCOSE 181* 410*    Electrolytes  Recent Labs Lab 10/09/16 1653 10/09/16 1959  CALCIUM 8.4* 7.6*    CBC  Recent Labs Lab 10/09/16 1653  WBC 9.2  HGB 10.0*  HCT 30.3*  PLT 139*    Coag's No results for input(s): APTT, INR in the last 168 hours.  Sepsis Markers No results for input(s): LATICACIDVEN, PROCALCITON, O2SATVEN in the last 168 hours.  ABG  Recent Labs Lab 10/09/16 2108 10/09/16 2300  PHART 7.32* 7.38  PCO2ART 38 36  PO2ART 51* 81*    Liver Enzymes No results for input(s): AST, ALT, ALKPHOS, BILITOT, ALBUMIN in the last 168 hours.  Cardiac Enzymes  Recent Labs Lab 10/09/16 1653 10/09/16 1959  TROPONINI <0.03 <0.03    Glucose No results for input(s): GLUCAP in the last 168 hours.  Imaging Dg Chest 2 View  Result Date: 10/09/2016 CLINICAL DATA:  Acute onset of syncope and vomiting. Cough. Initial encounter. EXAM: CHEST  2 VIEW COMPARISON:  Chest radiograph performed 04/14/2015 FINDINGS: The lungs are well-aerated. Right basilar airspace opacity raises concern for pneumonia. No pleural effusion or pneumothorax is seen. The heart is borderline normal in size. A pacemaker is noted at the left chest wall, with leads ending at the right atrium and right ventricle. No acute osseous abnormalities are seen. IMPRESSION: Right basilar pneumonia noted. Electronically Signed   By: Garald Balding M.D.   On: 10/09/2016 18:36   US Venous Img Lower Bilateral  Result Date: 10/09/2016 CLINICAL DATA:  Leg swelling.  Hypoxia. EXAM: BILATERAL LOWER EXTREMITY VENOUS DOPPLER ULTRASOUND TECHNIQUE: Gray-scale sonography with graded compression, as  well as color Doppler and duplex ultrasound were performed to evaluate the lower extremity deep venous systems from the level of the common femoral vein and including the common femoral, femoral, profunda femoral, popliteal and calf veins including the posterior tibial, peroneal and gastrocnemius veins when visible. The superficial great saphenous vein was also interrogated. Spectral Doppler was utilized to evaluate flow at rest and with distal augmentation maneuvers in the common femoral, femoral and popliteal veins. COMPARISON:  None. FINDINGS: RIGHT LOWER EXTREMITY Common Femoral Vein: No evidence of thrombus. Normal compressibility, respiratory phasicity and response to augmentation. Saphenofemoral Junction: No evidence of thrombus. Normal compressibility and flow on color Doppler imaging. Profunda Femoral Vein: No evidence of thrombus. Normal compressibility and flow on color Doppler imaging. Femoral Vein: No evidence of thrombus. Normal compressibility, respiratory phasicity and response to augmentation. Popliteal Vein: No evidence of thrombus. Normal compressibility, respiratory phasicity and response to augmentation. Calf Veins:  No evidence of thrombus. Normal compressibility and flow on color Doppler imaging. Superficial Great Saphenous Vein: No evidence of thrombus. Normal compressibility and flow on color Doppler imaging. Venous Reflux:  None. Other Findings:  None. LEFT LOWER EXTREMITY Common Femoral Vein: No evidence of thrombus. Normal compressibility, respiratory phasicity and response to augmentation. Saphenofemoral Junction: No evidence of thrombus. Normal compressibility and flow on color Doppler imaging. Profunda Femoral Vein: No evidence of thrombus. Normal compressibility and flow on color Doppler imaging. Femoral Vein: No evidence of thrombus. Normal compressibility, respiratory phasicity and response to augmentation. Popliteal Vein: No evidence of thrombus. Normal compressibility, respiratory  phasicity and response to augmentation. Calf Veins: No evidence of thrombus. Normal compressibility and flow on color Doppler imaging. Superficial Great Saphenous Vein: No evidence of thrombus. Normal compressibility and flow on color Doppler imaging. Venous Reflux:  None. Other Findings:  None. IMPRESSION: No evidence of deep venous thrombosis. Electronically Signed   By: Lajean Manes M.D.   On: 10/09/2016 18:19   Dg Chest Portable 1 View  Result Date: 10/09/2016 CLINICAL DATA:  Dyspnea EXAM: PORTABLE CHEST 1 VIEW COMPARISON:  Chest radiograph from earlier today. FINDINGS: Stable configuration of 2 lead left subclavian pacemaker. Stable cardiomediastinal silhouette with normal heart size and aortic atherosclerosis. No pneumothorax. No pleural effusion. Significant worsening of patchy consolidation at the right lung base. IMPRESSION: Significant worsening of patchy consolidation at the right lung base since this afternoon, most compatible with blooming pneumonia (particularly if the patient received IV hydration). Recommend follow-up chest radiographs to resolution. Electronically Signed   By: Ilona Sorrel M.D.   On: 10/09/2016 21:29    STUDIES:  2 D echo ordered BLLE dopplers-Negative for DVT  CULTURES: Blood cultures x 2 Sputum culture  ANTIBIOTICS: Zosyn 10/09/16>  SIGNIFICANT EVENTS: 10/09/16>syncope>ed>admitted with AKI, RLL pneumonia and acute hypoxemic respiratory failure 2/2 aspiration pneumonia  LINES/TUBES: PIVs  DISCUSSION: 83 y/o AA male admitted with aspiration pneumonia, acute kidney injury secondary to volume depletion/T2DM, acute hypoxemic respiratory failure requiring BiPAP and near-syncope secondary to dehydration  ASSESSMENT Aspiration pneumonia-RLL Acute kidney injury secondary to volume depletion/T2DM Acute hypoxemic respiratory failure  Near-syncope  Dehydration secondary to poor oral intake H/O Hypertension H/O T2DM H/O SSS S/P PACEMAKER H/O AAA  PLAN -PRN  BiPAP and titrate to [place] as tolerated Zosyn-renally dosed Gentle hydration 2-D echo Orthostatic v/s Routine labs in am Cycle troponin Swallow evaluation PT/OT f/u cultures GI/DVT prophylaxis   Disposition and family update: Family updated at bedside. Patient is a full code  - Inter-disciplinary family meet or Palliative Care meeting due by:  day 7  Plan of care discussed with [facility] Physician and Dr. Jonathon Jordan. [facility] ANP-BC Pulmonary and [place] Pager [phone] or [phone] 10/09/2016, 11:37 PM   PCCM ATTENDING ATTESTATION:  I have evaluated patient with the APP [place], reviewed database in its entirety and discussed care plan in detail.   He vomited X 2, then developed hypoxic respiratory failure with R infrahilar opacity. This is a classic location for aspiration PNA. He is now comfortable on  O2. He can be safely transferred to [facility], [facility], [facility] floor. Pip-tazo is a reasonable abx choice. He can probably be transitioned to Augmentin 04/02. Would complete 7 days of abx. Short term home O2 might be considered if he is ready for discharge except for persistent supplemental O2 requirement. It is unlikely that he will need long term O2. I updated pt's son and daughter  Diagnoses: Acute hypoxic respiratory failure Aspiration  PNA, NOS  PLAN/REC: Complete abx as above Continue supplemental O2 - assess O2 needs prior to discharge  Might consider short term home O2 therapy if otherwise ready for discharge  PCCM will sign off after transfer to med-surg. Please call if we can be of further assistance  Merton Border, MD PCCM service Mobile [phone] Pager [phone]

## 2016-10-09 NOTE — ED Notes (Addendum)
This RN asked Sharyn Lull, RN to check pt's O2 saturation placement. Pt's O2 showing on monitor 87% with questionable waveform. O2 site changed hand and finger. Pt's hands cold, warm blanket given.

## 2016-10-09 NOTE — Progress Notes (Signed)
Family Meeting Note  Advance Directive:yes  Today a meeting took place with the Patient and daughter.   The following clinical team members were present during this meeting:MD  The following were discussed:Patient's diagnosis: Aspiration pneumonia, DM, Htn , Patient's progosis: Unable to determine and Goals for treatment: Full Code  Additional follow-up to be provided: PMD  Time spent during discussion:20 minutes  Blake [name], Rosalio Macadamia, MD

## 2016-10-09 NOTE — ED Notes (Signed)
Patient transported to Ultrasound 

## 2016-10-09 NOTE — H&P (Signed)
[facility] NAME: Blake [name]    MR#:  [id number]  DATE OF BIRTH:  06-Mar-1923  DATE OF ADMISSION:  10/09/2016  PRIMARY CARE PHYSICIAN: Adin Hector, MD   REQUESTING/REFERRING PHYSICIAN: Alfred Levins  CHIEF COMPLAINT:   Chief Complaint  Patient presents with  . Near Syncope    HISTORY OF PRESENT ILLNESS: Blake [name]  is a 83 y.o. male with a known history of Arrhythmia, diabetes, hyperlipidemia, hypertension, prostate cancer- lives with daughter and walks with a walker at home. Today he was sitting in the porch with his daughter in hot sun and after a few hours they came inside and daughter felt he is very hot and dehydrated as he did not drink enough water. When they came inside he vomited and daughter is suspecting he most likely had aspirated at that time. They were trying to cool him off but he again had one more episode of vomiting and then he was some short of breath so she decided to bring him to emergency room. In ER he is noted to have pneumonia on his chest x-ray and he was hypoxic on room air and requiring supplemental oxygen so given to hospitalist team for further management.  PAST MEDICAL HISTORY:   Past Medical History:  Diagnosis Date  . AAA (abdominal aortic aneurysm) ([place])   . Arrhythmia   . Diabetes mellitus without complication ([place])   . Heart murmur   . Hyperlipidemia   . Hypertension   . Prostate cancer ([place])     PAST SURGICAL HISTORY: Past Surgical History:  Procedure Laterality Date  . JOINT REPLACEMENT    . PACEMAKER INSERTION Left 04/14/2015   Procedure: INSERTION PACEMAKER;  Surgeon: Isaias Cowman, MD;  Location: [facility] ORS;  Service: Cardiovascular;  Laterality: Left;  . PROSTATE SURGERY      SOCIAL HISTORY:  Social History  Substance Use Topics  . Smoking status: Former Research scientist (life sciences)  . Smokeless tobacco: Never Used  . Alcohol use No    FAMILY HISTORY:  Family History  Problem Relation Age  of Onset  . Hypertension Other   . Stroke Other   . Heart attack Other   . Stroke Sister   . Heart attack Brother     DRUG ALLERGIES:  Allergies  Allergen Reactions  . Diovan [Valsartan] Cough  . Hydralazine Itching  . Lisinopril Cough    REVIEW OF SYSTEMS:   CONSTITUTIONAL: No fever, fatigue or weakness.  EYES: No blurred or double vision.  EARS, NOSE, AND THROAT: No tinnitus or ear pain.  RESPIRATORY: No cough, Positive for shortness of breath, no wheezing or hemoptysis.  CARDIOVASCULAR: No chest pain, orthopnea, edema.  GASTROINTESTINAL: No nausea, vomiting, diarrhea or abdominal pain.  GENITOURINARY: No dysuria, hematuria.  ENDOCRINE: No polyuria, nocturia,  HEMATOLOGY: No anemia, easy bruising or bleeding SKIN: No rash or lesion. MUSCULOSKELETAL: No joint pain or arthritis.   NEUROLOGIC: No tingling, numbness, weakness.  PSYCHIATRY: No anxiety or depression.   MEDICATIONS AT HOME:  Prior to Admission medications   Medication Sig Start Date End Date Taking? Authorizing Provider  amiodarone (PACERONE) 200 MG tablet Take 200 mg by mouth daily.   Yes Historical Provider, MD  apixaban (ELIQUIS) 5 MG TABS tablet Take 5 mg by mouth 2 (two) times daily.   Yes Historical Provider, MD  azelastine (ASTELIN) 0.1 % nasal spray Place 1 spray into both nostrils 2 (two) times daily. Use in each nostril as directed  Yes Historical Provider, MD  finasteride (PROSCAR) 5 MG tablet Take 5 mg by mouth daily.   Yes Historical Provider, MD  glimepiride (AMARYL) 2 MG tablet 2 tablets in the morning and one tablet at night Patient taking differently: 4 mg.  04/15/15  Yes Adin Hector, MD  Multiple Vitamins-Minerals (PRESERVISION/LUTEIN PO) Take 1 capsule by mouth 2 (two) times daily.   Yes Historical Provider, MD  ranitidine (ZANTAC) 150 MG tablet Take 150 mg by mouth 2 (two) times daily.   Yes Historical Provider, MD  rosuvastatin (CRESTOR) 5 MG tablet Take 5 mg by mouth at bedtime.   Yes  Historical Provider, MD  sitaGLIPtin (JANUVIA) 50 MG tablet Take 50 mg by mouth daily.   Yes Historical Provider, MD  tamsulosin (FLOMAX) 0.4 MG CAPS capsule Take 0.4 mg by mouth at bedtime.   Yes Historical Provider, MD  traMADol (ULTRAM) 50 MG tablet Take by mouth every 6 (six) hours as needed.   Yes Historical Provider, MD  amLODipine (NORVASC) 5 MG tablet Take 5 mg by mouth daily.    Historical Provider, MD  cephALEXin (KEFLEX) 250 MG capsule Take 1 capsule (250 mg total) by mouth 4 (four) times daily. Patient not taking: Reported on 10/09/2016 04/15/15   [name], MD  clopidogrel (PLAVIX) 75 MG tablet Take 75 mg by mouth daily.    Historical Provider, MD  sotalol (BETAPACE) 120 MG tablet Take 1 tablet (120 mg total) by mouth every 12 (twelve) hours. Patient not taking: Reported on 10/09/2016 04/15/15   [name], MD  triamcinolone cream (KENALOG) 0.5 % Apply 1 application topically 2 (two) times daily.    Historical Provider, MD      PHYSICAL EXAMINATION:   VITAL SIGNS: Blood pressure (!) 148/70, pulse 71, temperature 98.5 F (36.9 C), temperature source Oral, resp. rate 18, height 6\' 2"  (1.88 m), weight 98.9 kg (218 lb), SpO2 94 %.  GENERAL:  83 y.o.-year-old patient lying in the bed with no acute distress.  EYES: Pupils equal, round, reactive to light and accommodation. No scleral icterus. Extraocular muscles intact.  HEENT: Head atraumatic, normocephalic. Oropharynx and nasopharynx clear.  NECK:  Supple, no jugular venous distention. No thyroid enlargement, no tenderness.  LUNGS: Normal breath sounds bilaterally, Some wheezing, no crepitation. No use of accessory muscles of respiration.  CARDIOVASCULAR: S1, S2 normal. No murmurs, rubs, or gallops.  ABDOMEN: Soft, nontender, nondistended. Bowel sounds present. No organomegaly or mass.  EXTREMITIES: No pedal edema, cyanosis, or clubbing.  NEUROLOGIC: Cranial nerves II through XII are intact. Muscle strength 5/5 in all  extremities. Sensation intact. Gait not checked.  PSYCHIATRIC: The patient is alert and oriented x 3.  SKIN: No obvious rash, lesion, or ulcer.   LABORATORY PANEL:   CBC  Recent Labs Lab 10/09/16 1653  WBC 9.2  HGB 10.0*  HCT 30.3*  PLT 139*  MCV 82.9  MCH 27.4  MCHC 33.1  RDW 16.3*   ------------------------------------------------------------------------------------------------------------------  Chemistries   Recent Labs Lab 10/09/16 1653  NA 138  K 3.7  CL 105  CO2 24  GLUCOSE 181*  BUN 31*  CREATININE 2.50*  CALCIUM 8.4*   ------------------------------------------------------------------------------------------------------------------ estimated creatinine clearance is 24.2 mL/min (A) (by C-G formula based on SCr of 2.5 mg/dL (H)). ------------------------------------------------------------------------------------------------------------------ No results for input(s): TSH, T4TOTAL, T3FREE, THYROIDAB in the last 72 hours.  Invalid input(s): FREET3   Coagulation profile No results for input(s): INR, PROTIME in the last 168 hours. ------------------------------------------------------------------------------------------------------------------- No results  for input(s): DDIMER in the last 72 hours. -------------------------------------------------------------------------------------------------------------------  Cardiac Enzymes  Recent Labs Lab 10/09/16 1653  TROPONINI <0.03   ------------------------------------------------------------------------------------------------------------------ Invalid input(s): POCBNP  ---------------------------------------------------------------------------------------------------------------  Urinalysis    Component Value Date/Time   COLORURINE YELLOW (A) 04/09/2015 0555   APPEARANCEUR HAZY (A) 04/09/2015 0555   APPEARANCEUR CLOUDY 12/29/2012 1554   LABSPEC 1.015 04/09/2015 0555   LABSPEC 1.029 12/29/2012 1554    PHURINE 5.0 04/09/2015 0555   GLUCOSEU NEGATIVE 04/09/2015 0555   GLUCOSEU see comment 12/29/2012 1554   HGBUR 2+ (A) 04/09/2015 0555   BILIRUBINUR NEGATIVE 04/09/2015 0555   BILIRUBINUR see comment 12/29/2012 [place] 04/09/2015 0555   PROTEINUR 30 (A) 04/09/2015 0555   NITRITE NEGATIVE 04/09/2015 0555   LEUKOCYTESUR 3+ (A) 04/09/2015 0555   LEUKOCYTESUR see comment 12/29/2012 1554     RADIOLOGY: Dg Chest 2 View  Result Date: 10/09/2016 CLINICAL DATA:  Acute onset of syncope and vomiting. Cough. Initial encounter. EXAM: CHEST  2 VIEW COMPARISON:  Chest radiograph performed 04/14/2015 FINDINGS: The lungs are well-aerated. Right basilar airspace opacity raises concern for pneumonia. No pleural effusion or pneumothorax is seen. The heart is borderline normal in size. A pacemaker is noted at the left chest wall, with leads ending at the right atrium and right ventricle. No acute osseous abnormalities are seen. IMPRESSION: Right basilar pneumonia noted. Electronically Signed   By: Garald Balding M.D.   On: 10/09/2016 18:36   US Venous Img Lower Bilateral  Result Date: 10/09/2016 CLINICAL DATA:  Leg swelling.  Hypoxia. EXAM: BILATERAL LOWER EXTREMITY VENOUS DOPPLER ULTRASOUND TECHNIQUE: Gray-scale sonography with graded compression, as well as color Doppler and duplex ultrasound were performed to evaluate the lower extremity deep venous systems from the level of the common femoral vein and including the common femoral, femoral, profunda femoral, popliteal and calf veins including the posterior tibial, peroneal and gastrocnemius veins when visible. The superficial great saphenous vein was also interrogated. Spectral Doppler was utilized to evaluate flow at rest and with distal augmentation maneuvers in the common femoral, femoral and popliteal veins. COMPARISON:  None. FINDINGS: RIGHT LOWER EXTREMITY Common Femoral Vein: No evidence of thrombus. Normal compressibility, respiratory  phasicity and response to augmentation. Saphenofemoral Junction: No evidence of thrombus. Normal compressibility and flow on color Doppler imaging. Profunda Femoral Vein: No evidence of thrombus. Normal compressibility and flow on color Doppler imaging. Femoral Vein: No evidence of thrombus. Normal compressibility, respiratory phasicity and response to augmentation. Popliteal Vein: No evidence of thrombus. Normal compressibility, respiratory phasicity and response to augmentation. Calf Veins: No evidence of thrombus. Normal compressibility and flow on color Doppler imaging. Superficial Great Saphenous Vein: No evidence of thrombus. Normal compressibility and flow on color Doppler imaging. Venous Reflux:  None. Other Findings:  None. LEFT LOWER EXTREMITY Common Femoral Vein: No evidence of thrombus. Normal compressibility, respiratory phasicity and response to augmentation. Saphenofemoral Junction: No evidence of thrombus. Normal compressibility and flow on color Doppler imaging. Profunda Femoral Vein: No evidence of thrombus. Normal compressibility and flow on color Doppler imaging. Femoral Vein: No evidence of thrombus. Normal compressibility, respiratory phasicity and response to augmentation. Popliteal Vein: No evidence of thrombus. Normal compressibility, respiratory phasicity and response to augmentation. Calf Veins: No evidence of thrombus. Normal compressibility and flow on color Doppler imaging. Superficial Great Saphenous Vein: No evidence of thrombus. Normal compressibility and flow on color Doppler imaging. Venous Reflux:  None. Other Findings:  None. IMPRESSION: No evidence of deep venous thrombosis. Electronically Signed  By: Lajean Manes M.D.   On: 10/09/2016 18:19    EKG: Orders placed or performed during the hospital encounter of 10/09/16  . EKG 12-Lead  . EKG 12-Lead  . ED EKG  . ED EKG  . EKG 12-Lead  . EKG 12-Lead    IMPRESSION AND PLAN:  * Aspiration pneumonia   Acute hypoxic  respiratory failure    We will give Zosyn for now, cultures are sent by ER physician.   As per daughter, he does not have any difficulty in swallowing any kind of food so I will not do a speech and swallow evaluation.  * Acute on chronic renal failure, CK D stage III   Creatinine slightly worse than his baseline, will give IV fluid and monitor.  * Diabetes   We'll continue his home medicine and keep on insulin sliding scale coverage.  * Hypertension   Blood pressure is stable, continue home medicines.  * Trigeminal neuralgia   Continue home medicines.  * Cardiac arrhythmia   Continue amiodarone and Apixaban.  All the records are reviewed and case discussed with ED provider. Management plans discussed with the patient, family and they are in agreement.  CODE STATUS: Full code Code Status History    Date Active Date Inactive Code Status Order ID Comments User Context   04/14/2015  2:32 PM 04/15/2015  5:39 PM Full Code [id number]  Isaias Cowman, MD Inpatient   04/09/2015 12:22 AM 04/14/2015  2:32 PM Full Code [id number]  Lytle Butte, MD ED    Advance Directive Documentation     Most Recent Value  Type of Advance Directive  Healthcare Power of [place], Living will  Pre-existing out of facility DNR order (yellow form or pink MOST form)  -  "MOST" Form in Place?  -     Plan discussed with patient's daughter and grandson in the room. Daughter is healthcare power of attorney.  TOTAL TIME TAKING CARE OF THIS PATIENT: 50 minutes.    Vaughan Basta M.D on 10/09/2016   Between 7am to 6pm - Pager - [phone]  After 6pm go to [email or web address] - password [email or web address]  Office  [phone]  CC: Primary care physician; Adin Hector, MD   Note: This dictation was prepared with Dragon dictation along with smaller phrase technology. Any transcriptional errors that result from this process are unintentional.

## 2016-10-09 NOTE — ED Notes (Signed)
This RN called xray for chest xray, no answer at this time. Will call back.

## 2016-10-09 NOTE — Progress Notes (Signed)
Pharmacy Antibiotic Note  Blake [name] is a 82 y.o. male admitted on 10/09/2016 with Aspiration  pneumonia.  Pharmacy has been consulted for Zosyn dosing. Patient received azithromycin 500mg  IV and ceftriaxone 1gm IV in ED.   Plan: Will start patient on Zosyn 3.375 IV EI every 8 hours.   Height: 6\' 2"  (188 cm) Weight: 218 lb (98.9 kg) IBW/kg (Calculated) : 82.2  Temp (24hrs), Avg:98.5 F (36.9 C), Min:98.5 F (36.9 C), Max:98.5 F (36.9 C)   Recent Labs Lab 10/09/16 1653  WBC 9.2  CREATININE 2.50*    Estimated Creatinine Clearance: 24.2 mL/min (A) (by C-G formula based on SCr of 2.5 mg/dL (H)).    Allergies  Allergen Reactions  . Diovan [Valsartan] Cough  . Hydralazine Itching  . Lisinopril Cough    Antimicrobials this admission: 3/31 zosyn  >>   Dose adjustments this admission:   Microbiology results:  Thank you for allowing pharmacy to be a part of this patient's care.  Pernell Dupre, PharmD, BCPS Clinical Pharmacist 10/09/2016 8:14 PM

## 2016-10-09 NOTE — ED Triage Notes (Signed)
Pt presents to ED via [facility] with c/o near syncope. Per EMS pt was sitting on the porch when he "almost went out, family states they don't think he went all the way out". Per EMS upon arrival pt's BP 84/40 while sitting, increased to 114/57 when laying on the stretcher. Pt has hx of RBB and has a pacemaker, EMS inserted 20G to L forearm, CBG 204, O2 "high 80's" upon EMS arrival. Per EMS pt had 1 episode of vomiting prior to EMS arrival and has had cough x 2 days.

## 2016-10-09 NOTE — Progress Notes (Signed)
I was called by ER nurse this patient has worsening respiratory distress and hypoxia. I saw the patient again and he was having tachypnea and hypoxia, has increased requirement of oxygen and so ordered a repeat chest x-ray and started him on BiPAP and ordered a stat ABG.  Physical exam   Patient appears in acute respiratory distress, tachypnea, bilateral air entry with some wheezing and crepitation on the right side.  Lab results   ABG- pH 7.32, PCO2 38, PO2 51 on 50% FiO2 BiPAP.  Chest x-ray portable shows worsening of right-sided infiltrate.  Assessment and plan  * Acute hypoxic respiratory failure  Significant worsening likely chemical pneumonitis along with aspiration pneumonia    Started on BiPAP for now, we will admit him to stepdown unit.   I again discussed with patient's daughter in the room,   Also spoke to on call critical care specialist Dr.Summers   As per him, try Bipap for 1-2 hrs and repeat ABG, BP stable.   We need to reevaluate and keep a low threshold for intubation.   As per family he is a full code.  Additional critical care time spent 35 minutes.

## 2016-10-09 NOTE — ED Notes (Addendum)
Pt had another episode of emesis, thick green sputum seen in emesis. EDP [place] in room. Pt's daughter reports pt had 1 episode of emesis earlier.

## 2016-10-09 NOTE — ED Notes (Signed)
Pt's daughter reports pt has hx of O2 saturation dropping and pt needing intubation. MD made aware.

## 2016-10-09 NOTE — ED Notes (Signed)
Per report, pt presents with O2 in high 80's pt on oxygen, MD aware of this.

## 2016-10-09 NOTE — ED Notes (Signed)
This RN interrogated patient's pace maker at this time.

## 2016-10-09 NOTE — ED Notes (Addendum)
Pt's O2 saturation showing 86%, pt's oxygen changed to 4L nasal canula. Pt's oxygen rose to 92% with change shown on monitor.

## 2016-10-10 DIAGNOSIS — J690 Pneumonitis due to inhalation of food and vomit: Principal | ICD-10-CM

## 2016-10-10 DIAGNOSIS — N179 Acute kidney failure, unspecified: Secondary | ICD-10-CM

## 2016-10-10 DIAGNOSIS — J9601 Acute respiratory failure with hypoxia: Secondary | ICD-10-CM

## 2016-10-10 DIAGNOSIS — R55 Syncope and collapse: Secondary | ICD-10-CM

## 2016-10-10 DIAGNOSIS — N189 Chronic kidney disease, unspecified: Secondary | ICD-10-CM

## 2016-10-10 LAB — CBC
HCT: 35.1 % — ABNORMAL LOW (ref 40.0–52.0)
HEMOGLOBIN: 11.6 g/dL — AB (ref 13.0–18.0)
MCH: 27.8 pg (ref 26.0–34.0)
MCHC: 33 g/dL (ref 32.0–36.0)
MCV: 84.3 fL (ref 80.0–100.0)
Platelets: 138 10*3/uL — ABNORMAL LOW (ref 150–440)
RBC: 4.17 MIL/uL — AB (ref 4.40–5.90)
RDW: 16 % — ABNORMAL HIGH (ref 11.5–14.5)
WBC: 7.5 10*3/uL (ref 3.8–10.6)

## 2016-10-10 LAB — URINALYSIS, COMPLETE (UACMP) WITH MICROSCOPIC
BILIRUBIN URINE: NEGATIVE
Bacteria, UA: NONE SEEN
Glucose, UA: 50 mg/dL — AB
Ketones, ur: NEGATIVE mg/dL
Nitrite: NEGATIVE
Protein, ur: 30 mg/dL — AB
SPECIFIC GRAVITY, URINE: 1.014 (ref 1.005–1.030)
SQUAMOUS EPITHELIAL / LPF: NONE SEEN
pH: 5 (ref 5.0–8.0)

## 2016-10-10 LAB — EXPECTORATED SPUTUM ASSESSMENT W GRAM STAIN, RFLX TO RESP C

## 2016-10-10 LAB — COMPREHENSIVE METABOLIC PANEL
ALBUMIN: 3.1 g/dL — AB (ref 3.5–5.0)
ALK PHOS: 82 U/L (ref 38–126)
ALT: 13 U/L — ABNORMAL LOW (ref 17–63)
ANION GAP: 7 (ref 5–15)
AST: 20 U/L (ref 15–41)
BILIRUBIN TOTAL: 0.8 mg/dL (ref 0.3–1.2)
BUN: 33 mg/dL — ABNORMAL HIGH (ref 6–20)
CALCIUM: 8.2 mg/dL — AB (ref 8.9–10.3)
CO2: 24 mmol/L (ref 22–32)
Chloride: 106 mmol/L (ref 101–111)
Creatinine, Ser: 2.35 mg/dL — ABNORMAL HIGH (ref 0.61–1.24)
GFR, EST AFRICAN AMERICAN: 26 mL/min — AB (ref >60)
GFR, EST NON AFRICAN AMERICAN: 23 mL/min — AB (ref >60)
Glucose, Bld: 214 mg/dL — ABNORMAL HIGH (ref 65–99)
Potassium: 4.2 mmol/L (ref 3.5–5.1)
Sodium: 137 mmol/L (ref 135–145)
TOTAL PROTEIN: 7.4 g/dL (ref 6.5–8.1)

## 2016-10-10 LAB — GLUCOSE, CAPILLARY
GLUCOSE-CAPILLARY: 193 mg/dL — AB (ref 65–99)
GLUCOSE-CAPILLARY: 159 mg/dL — AB (ref 65–99)
GLUCOSE-CAPILLARY: 175 mg/dL — AB (ref 65–99)

## 2016-10-10 LAB — TROPONIN I

## 2016-10-10 LAB — EXPECTORATED SPUTUM ASSESSMENT W REFEX TO RESP CULTURE

## 2016-10-10 LAB — MRSA PCR SCREENING: MRSA BY PCR: NEGATIVE

## 2016-10-10 MED ORDER — ACETAMINOPHEN 325 MG PO TABS: 650.0000 mg | ORAL_TABLET | Freq: Four times a day (QID) | ORAL | Status: DC | PRN

## 2016-10-10 MED ORDER — SODIUM CHLORIDE 0.9 % IV SOLN
1.0000 g | Freq: Once | INTRAVENOUS | Status: AC
  Administered 2016-10-10: 1 g via INTRAVENOUS
  Filled 2016-10-10: qty 10

## 2016-10-10 MED ORDER — APIXABAN 2.5 MG PO TABS
2.5000 mg | ORAL_TABLET | Freq: Two times a day (BID) | ORAL | Status: DC
  Administered 2016-10-10 – 2016-10-13 (×6): 2.5 mg via ORAL
  Filled 2016-10-10 (×6): qty 1

## 2016-10-10 NOTE — Progress Notes (Signed)
Pt transferred from ICU on 4L [place]. Patient in no respiratory distress. IV in right arm infiltrated, leaving the arm edematous but not painful. Patient pulled IV in left arm out. Site clean, dry and intact, will restart IV when possible. Ammie Dalton, RN

## 2016-10-10 NOTE — Evaluation (Signed)
Physical Therapy Evaluation Patient Details Name: Blake [name] MRN: [id number] DOB: 1922-12-24 Today's Date: 10/10/2016   History of Present Illness  Nicolaos Mitrano is a 83yo balck person identifying as male who comes to [facility] after coughing and SOB after episode of emesis, suspected to have aspirated by family, pt found to have PNA upon arrival. PMH includes DM2, HLD, HTN, PrCA, PPM, trigeminal neuralgia, remote Left TKA, and visual impairment related to Macular Degeneration. At baseline pt performs mostly household distance AMB with RW, WC for community access propelled by caregivers, assisstance during the day from caregivers/family, and Independent in basic ADL.   Clinical Impression  Pt admitted with above diagnosis. Pt currently with functional limitations due to the deficits listed below (see "PT Problem List"). Upon entry, the patient is received semirecumbent in bed, pt's son present. The pt is awake and agreeable to participate. No acute distress noted at this time. The pt is alert and oriented to self, pleasant, conversational, and following simple and multi-step commands consistently. Pt received on and remaining on 4L O2 throughout evaluation, with noted saturation of 88% during gait trial, whereas pt is not on O2 at baseline at home. Functional mobility assessment demonstrates moderate to severe weakness, the pt now requiring min to mod assist physical assistance, transfers requiring maximal effort. Gait is stable, but slow and labored with postural deficits, however the patient's son reports it to appear within 75% of baseline gait; pt reports he feels good walking. The patient is at high risk for falls as evidence by gait speed <1.10m/s, forward reach <5", and multiple LOB demonstrated throughout session (difficulty obtaining stable stance when rising from bed.)   Pt will benefit from skilled PT intervention to increase independence and safety with basic mobility in preparation for discharge  to the venue listed below. In spite of falls risk and mobility limitations, the patient is very near his functional baseline (with exception to acute O2 needs) and has heavy support from family/caregivers. In light of the patient's full clinical picture, DC to home is the most appropriate option at this time; son is agreeable with these recommendations.        Follow Up Recommendations Home health PT;Supervision/Assistance - 24 hour    Equipment Recommendations  None recommended by PT    Recommendations for Other Services       Precautions / Restrictions Precautions Precautions: Fall Restrictions Weight Bearing Restrictions: No      Mobility  Bed Mobility Overal bed mobility: Needs Assistance Bed Mobility: Supine to Sit     Supine to sit: Mod assist     General bed mobility comments: difficulty moving hips in bed, needs HHA to pulf self forward   Transfers Overall transfer level: Needs assistance Equipment used: Rolling walker (2 wheeled) Transfers: Sit to/from Stand Sit to Stand: Min assist         General transfer comment: required maximal effort and heavy UE use; very poor mechanics due to weakness in back and hip extensors. Unsafe use of RW, 30 seconds to obtain a steay standing position.   Ambulation/Gait Ambulation/Gait assistance: Min guard Ambulation Distance (Feet): 70 Feet Assistive device: Rolling walker (2 wheeled) Gait Pattern/deviations: Step-to pattern;Decreased step length - right;Decreased step length - left;Trunk flexed;Drifts right/left Gait velocity: <0.41m/s Gait velocity interpretation: <1.8 ft/sec, indicative of risk for recurrent falls General Gait Details: generally steady enough to stop and blow nose with both hands. Requires verbal cues for nasal inhalation with O2 drop to 88%, difficult to see visibly  indicative of some inspiratory muscle weakness.   Stairs            Wheelchair Mobility    Modified Rankin (Stroke Patients  Only)       Balance Overall balance assessment: Needs assistance (no falls in past 6 months) Sitting-balance support: No upper extremity supported;Feet supported Sitting balance-Leahy Scale: Good     Standing balance support: Bilateral upper extremity supported;During functional activity Standing balance-Leahy Scale: Poor Standing balance comment: posterior lean, requires walker out forward excessively for stability.                              Pertinent Vitals/Pain Pain Assessment: No/denies pain    Home Living Family/patient expects to be discharged to:: Private residence Living Arrangements: Spouse/significant other Available Help at Discharge: Family Type of Home: House Home Access: Stairs to enter Entrance Stairs-Rails: Can reach both;Left;Right Entrance Stairs-Number of Steps: 3 Home Layout: One level Home Equipment: [place] - 4 wheels;Cane - single point;Wheelchair - manual      Prior Function Level of Independence: Needs assistance   Gait / Transfers Assistance Needed: Household distances with AD, very slow step-to gait at baseline.   ADL's / Homemaking Assistance Needed: Indep in basic ADL; support from family for IADL, meals, appts etc.         Hand Dominance        Extremity/Trunk Assessment   Upper Extremity Assessment Upper Extremity Assessment: Generalized weakness;Overall [facility] for tasks assessed    Lower Extremity Assessment Lower Extremity Assessment: Generalized weakness;Overall [facility] for tasks assessed    Cervical / Trunk Assessment Cervical / Trunk Assessment:  (forward flexed. )  Communication   Communication: No difficulties;HOH  Cognition Arousal/Alertness: Awake/alert Behavior During Therapy: WFL for tasks assessed/performed Overall Cognitive Status: Within Functional Limits for tasks assessed                                        General Comments      Exercises     Assessment/Plan    PT Assessment  Patient needs continued PT services  PT Problem List Decreased strength;Decreased activity tolerance;Decreased balance;Decreased mobility;Decreased knowledge of precautions;Decreased knowledge of use of DME       PT Treatment Interventions Gait training;DME instruction;Stair training;Functional mobility training;Therapeutic activities;Therapeutic exercise;Balance training;Patient/family education    PT Goals (Current goals can be found in the Care Plan section)  Acute Rehab PT Goals Patient Stated Goal: Regain strength and stamina  PT Goal Formulation: With patient/family Time For Goal Achievement: 10/24/16 Potential to Achieve Goals: Good    Frequency Min 2X/week   Barriers to discharge        Co-evaluation               End of Session Equipment Utilized During Treatment: Gait belt;Oxygen Activity Tolerance: Patient tolerated treatment well Patient left: in bed;with SCD's reapplied;with nursing/sitter in room Nurse Communication: Mobility status PT Visit Diagnosis: Unsteadiness on feet (R26.81);Muscle weakness (generalized) (M62.81);Difficulty in walking, not elsewhere classified (R26.2);Other abnormalities of gait and mobility (R26.89)    Time: 5784-6962 PT Time Calculation (min) (ACUTE ONLY): 38 min   Charges:   PT Evaluation $PT Eval Moderate Complexity: 1 Procedure PT Treatments $Therapeutic Activity: 8-22 mins   PT G Codes:        1:55 PM, 2016/10/20 Etta Grandchild, PT, DPT Physical Therapist - [facility] [phone] ([place])  [phone] (mobile)    Gia Lusher C 10/10/2016, 1:49 PM

## 2016-10-10 NOTE — Progress Notes (Addendum)
[place] at [place] NAME: Blake [name]    MR#:  [id number]  DATE OF BIRTH:  [birth year]  SUBJECTIVE:  CHIEF COMPLAINT:   Chief Complaint  Patient presents with  . Near Syncope   Better cough and shortness of breath, off BiPAP and on O2 [place] 4L. REVIEW OF SYSTEMS:  Review of Systems  Constitutional: Positive for malaise/fatigue. Negative for chills and fever.  HENT: Negative for congestion.   Eyes: Negative for blurred vision and double vision.  Respiratory: Positive for cough, sputum production and shortness of breath. Negative for hemoptysis, wheezing and stridor.   Cardiovascular: Negative for chest pain, palpitations and leg swelling.  Gastrointestinal: Negative for abdominal pain, blood in stool, constipation, diarrhea, melena, nausea and vomiting.  Genitourinary: Negative for dysuria and hematuria.  Musculoskeletal: Negative for back pain.  Skin: Negative for itching and rash.  Neurological: Positive for weakness. Negative for dizziness, focal weakness, loss of consciousness and headaches.  Psychiatric/Behavioral: Negative for depression. The patient is not nervous/anxious.     DRUG ALLERGIES:   Allergies  Allergen Reactions  . Diovan [Valsartan] Cough  . Hydralazine Itching  . Lisinopril Cough   VITALS:  Blood pressure 108/66, pulse 86, temperature 98.6 F (37 C), temperature source Oral, resp. rate 20, height 6\' 2"  (1.88 m), weight 218 lb 0.6 oz (98.9 kg), SpO2 (!) 85 %. PHYSICAL EXAMINATION:  Physical Exam  Constitutional: He is oriented to person, place, and time and well-developed, well-nourished, and in no distress.  HENT:  Mouth/Throat: Oropharynx is clear and moist.  Eyes: Conjunctivae and EOM are normal.  Neck: Normal range of motion. Neck supple. No JVD present. No tracheal deviation present.  Cardiovascular: Normal rate, regular rhythm and normal heart sounds.  Exam reveals no gallop.   No murmur  heard. Pulmonary/Chest: Effort normal. No respiratory distress. He has no wheezes.  A lot of crackles and rhonchi bilaterally.  Abdominal: Soft. Bowel sounds are normal. He exhibits no distension. There is no tenderness.  Musculoskeletal: Normal range of motion. He exhibits no edema or tenderness.  Neurological: He is alert and oriented to person, place, and time. No cranial nerve deficit.  Skin: No rash noted. No erythema.  Psychiatric: Affect normal.   LABORATORY PANEL:  Male CBC  Recent Labs Lab 10/10/16 0123  WBC 7.5  HGB 11.6*  HCT 35.1*  PLT 138*   ------------------------------------------------------------------------------------------------------------------ Chemistries   Recent Labs Lab 10/10/16 0123  NA 137  K 4.2  CL 106  CO2 24  GLUCOSE 214*  BUN 33*  CREATININE 2.35*  CALCIUM 8.2*  AST 20  ALT 13*  ALKPHOS 82  BILITOT 0.8   RADIOLOGY:  Dg Chest 2 View  Result Date: 10/09/2016 CLINICAL DATA:  Acute onset of syncope and vomiting. Cough. Initial encounter. EXAM: CHEST  2 VIEW COMPARISON:  Chest radiograph performed 04/14/2015 FINDINGS: The lungs are well-aerated. Right basilar airspace opacity raises concern for pneumonia. No pleural effusion or pneumothorax is seen. The heart is borderline normal in size. A pacemaker is noted at the left chest wall, with leads ending at the right atrium and right ventricle. No acute osseous abnormalities are seen. IMPRESSION: Right basilar pneumonia noted. Electronically Signed   By: Garald Balding M.D.   On: 10/09/2016 18:36   US Venous Img Lower Bilateral  Result Date: 10/09/2016 CLINICAL DATA:  Leg swelling.  Hypoxia. EXAM: BILATERAL LOWER EXTREMITY VENOUS DOPPLER ULTRASOUND TECHNIQUE: Gray-scale sonography with graded compression, as well as color Doppler  and duplex ultrasound were performed to evaluate the lower extremity deep venous systems from the level of the common femoral vein and including the common femoral,  femoral, profunda femoral, popliteal and calf veins including the posterior tibial, peroneal and gastrocnemius veins when visible. The superficial great saphenous vein was also interrogated. Spectral Doppler was utilized to evaluate flow at rest and with distal augmentation maneuvers in the common femoral, femoral and popliteal veins. COMPARISON:  None. FINDINGS: RIGHT LOWER EXTREMITY Common Femoral Vein: No evidence of thrombus. Normal compressibility, respiratory phasicity and response to augmentation. Saphenofemoral Junction: No evidence of thrombus. Normal compressibility and flow on color Doppler imaging. Profunda Femoral Vein: No evidence of thrombus. Normal compressibility and flow on color Doppler imaging. Femoral Vein: No evidence of thrombus. Normal compressibility, respiratory phasicity and response to augmentation. Popliteal Vein: No evidence of thrombus. Normal compressibility, respiratory phasicity and response to augmentation. Calf Veins: No evidence of thrombus. Normal compressibility and flow on color Doppler imaging. Superficial Great Saphenous Vein: No evidence of thrombus. Normal compressibility and flow on color Doppler imaging. Venous Reflux:  None. Other Findings:  None. LEFT LOWER EXTREMITY Common Femoral Vein: No evidence of thrombus. Normal compressibility, respiratory phasicity and response to augmentation. Saphenofemoral Junction: No evidence of thrombus. Normal compressibility and flow on color Doppler imaging. Profunda Femoral Vein: No evidence of thrombus. Normal compressibility and flow on color Doppler imaging. Femoral Vein: No evidence of thrombus. Normal compressibility, respiratory phasicity and response to augmentation. Popliteal Vein: No evidence of thrombus. Normal compressibility, respiratory phasicity and response to augmentation. Calf Veins: No evidence of thrombus. Normal compressibility and flow on color Doppler imaging. Superficial Great Saphenous Vein: No evidence of  thrombus. Normal compressibility and flow on color Doppler imaging. Venous Reflux:  None. Other Findings:  None. IMPRESSION: No evidence of deep venous thrombosis. Electronically Signed   By: Lajean Manes M.D.   On: 10/09/2016 18:19   Dg Chest Portable 1 View  Result Date: 10/09/2016 CLINICAL DATA:  Dyspnea EXAM: PORTABLE CHEST 1 VIEW COMPARISON:  Chest radiograph from earlier today. FINDINGS: Stable configuration of 2 lead left subclavian pacemaker. Stable cardiomediastinal silhouette with normal heart size and aortic atherosclerosis. No pneumothorax. No pleural effusion. Significant worsening of patchy consolidation at the right lung base. IMPRESSION: Significant worsening of patchy consolidation at the right lung base since this afternoon, most compatible with blooming pneumonia (particularly if the patient received IV hydration). Recommend follow-up chest radiographs to resolution. Electronically Signed   By: Ilona Sorrel M.D.   On: 10/09/2016 21:29   ASSESSMENT AND PLAN:     Acute hypoxic respiratory failure due to  Aspiration pneumonia. The patient was put on BiPAP last night. Off BiPAP this morning and on O2 [place] 4 L. NEB. Continue Zosyn and follow-up cultures. Aspiration precaution.  * Acute on chronic renal failure, CKD stage III    continue IV fluid and follow-up BMP.  * Diabetes on insulin sliding scale coverage.  * Hypertension   Blood pressure is stable, continue home medicines.  * Trigeminal neuralgia   Continue home medicines.  * Paroxysmal atrial fibrillation, previously on aspirin and Plavix, started on Eliquis for stroke prevention Sick sinus syndrome, status post dual-chamber pacemaker.  prolonged QT. Hold amiodarone and sotalol.  Per Dr. Saralyn Pilar, Continue metoprolol for rate control and Apixaban.  Stop Plavix, No further cardiac diagnostics at this time.  Generalized weakness. PT evaluation. All the records are reviewed and case discussed with Care  Management/Social Worker. Management plans discussed with the patient,  family and they are in agreement.  CODE STATUS: Full Code  TOTAL TIME TAKING CARE OF THIS PATIENT: 37 minutes.   More than 50% of the time was spent in counseling/coordination of care: YES  POSSIBLE D/C IN 2-3 DAYS, DEPENDING ON CLINICAL CONDITION.   Demetrios Loll M.D on 10/10/2016 at 11:47 AM  Between 7am to 6pm - Pager - [phone]  After 6pm go to [email or web address] - Proofreader  [facility] [place] [facility]  Office  [phone]  CC: Primary care physician; Adin Hector, MD  Note: This dictation was prepared with Dragon dictation along with smaller phrase technology. Any transcriptional errors that result from this process are unintentional.

## 2016-10-10 NOTE — Progress Notes (Signed)
[place] responded to an OR for a visit. Pt is awake and alert. Son is at bedside. Son indicated there was no particular need for a visit, but indicated it is welcomed if a [place] came by. We had a good conversation about sports and life in general. Pt states he is feeling better and hopes to go home soon. CH is available for follow up as needed.    10/10/16 0900  Clinical Encounter Type  Visited With Patient and family together;Health care provider  Visit Type Initial;Spiritual support  Referral From Nurse  Consult/Referral To Chaplain  Spiritual Encounters  Spiritual Needs Emotional

## 2016-10-10 NOTE — Consult Note (Signed)
[facility] Cardiology  CARDIOLOGY CONSULT NOTE  Patient ID: Blake [name] MRN: [id number] DOB/AGE: 1923-03-24 83 y.o.  Admit date: 10/09/2016 Referring Physician Bridgett Larsson Primary Physician [facility] Primary Cardiologist [facility] Reason for Consultation Sinus syndrome, paroxysmal atrial fibrillation  HPI: 83 year old gentleman referred for evaluation of sick sinus syndrome and paroxysmal atrial fibrillation, previously on sotalol for rhythm control. The patient has a history of sick sinus syndrome status post dual-chamber pacemaker. Patient was in his usual state of health until yesterday, when he became dizzy after walking outside, developed nausea with vomiting, and presented to [facility] emergency room. She was noted to be hypoxic and hypotensive, x-ray revealing right lower lobe infiltrate aspiration pneumonia. The patient was admitted to the ICU where he has improved on spectrum antibiotics and O2 therapy. She denies chest pain or shortness of breath. He denies any recent history of presyncope or syncope.  Review of systems complete and found to be negative unless listed above     Past Medical History:  Diagnosis Date  . AAA (abdominal aortic aneurysm) ([place])   . Arrhythmia   . Diabetes mellitus without complication ([place])   . Heart murmur   . Hyperlipidemia   . Hypertension   . Prostate cancer [facility])     Past Surgical History:  Procedure Laterality Date  . JOINT REPLACEMENT    . PACEMAKER INSERTION Left 04/14/2015   Procedure: INSERTION PACEMAKER;  Surgeon: Isaias Cowman, MD;  Location: [facility] ORS;  Service: Cardiovascular;  Laterality: Left;  . PROSTATE SURGERY      Prescriptions Prior to Admission  Medication Sig Dispense Refill Last Dose  . amiodarone (PACERONE) 200 MG tablet Take 200 mg by mouth daily.   10/09/2016 at 1030  . apixaban (ELIQUIS) 5 MG TABS tablet Take 5 mg by mouth 2 (two) times daily.   10/09/2016 at 1030  . azelastine (ASTELIN) 0.1 % nasal spray Place 1 spray into both  nostrils 2 (two) times daily. Use in each nostril as directed   10/09/2016 at 1030  . finasteride (PROSCAR) 5 MG tablet Take 5 mg by mouth daily.   10/08/2016 at pm  . glimepiride (AMARYL) 2 MG tablet 2 tablets in the morning and one tablet at night (Patient taking differently: 4 mg. ) 90 tablet 11 10/09/2016 at 1030  . Multiple Vitamins-Minerals (PRESERVISION/LUTEIN PO) Take 1 capsule by mouth 2 (two) times daily.   10/09/2016 at 1030  . ranitidine (ZANTAC) 150 MG tablet Take 150 mg by mouth 2 (two) times daily.   10/09/2016 at 1030  . rosuvastatin (CRESTOR) 5 MG tablet Take 5 mg by mouth at bedtime.   10/08/2016 at pm  . sitaGLIPtin (JANUVIA) 50 MG tablet Take 50 mg by mouth daily.   10/09/2016 at 1030  . tamsulosin (FLOMAX) 0.4 MG CAPS capsule Take 0.4 mg by mouth at bedtime.   10/09/2016 at 1030  . traMADol (ULTRAM) 50 MG tablet Take by mouth every 6 (six) hours as needed.   prn at prn  . amLODipine (NORVASC) 5 MG tablet Take 5 mg by mouth daily.   Not Taking at Unknown time  . cephALEXin (KEFLEX) 250 MG capsule Take 1 capsule (250 mg total) by mouth 4 (four) times daily. (Patient not taking: Reported on 10/09/2016) 40 capsule 0 Completed Course at Unknown time  . clopidogrel (PLAVIX) 75 MG tablet Take 75 mg by mouth daily.   Not Taking at Unknown time  . sotalol (BETAPACE) 120 MG tablet Take 1 tablet (120 mg total) by mouth every 12 (  twelve) hours. (Patient not taking: Reported on 10/09/2016) 60 tablet 11 Not Taking at Unknown time  . triamcinolone cream (KENALOG) 0.5 % Apply 1 application topically 2 (two) times daily.   Not Taking at Unknown time   Social History   Social History  . Marital status: Married    Spouse name: N/A  . Number of children: N/A  . Years of education: N/A   Occupational History  . Not on file.   Social History Main Topics  . Smoking status: Former Research scientist (life sciences)  . Smokeless tobacco: Never Used  . Alcohol use No  . Drug use: No  . Sexual activity: Not on file   Other  Topics Concern  . Not on file   Social History Narrative  . No narrative on file    Family History  Problem Relation Age of Onset  . Hypertension Other   . Stroke Other   . Heart attack Other   . Stroke Sister   . Heart attack Brother       Review of systems complete and found to be negative unless listed above      PHYSICAL EXAM  General: Well developed, well nourished, in no acute distress HEENT:  Normocephalic and atramatic Neck:  No JVD.  Lungs: Clear bilaterally to auscultation and percussion. Heart: HRRR . Normal S1 and S2 without gallops or murmurs.  Abdomen: Bowel sounds are positive, abdomen soft and non-tender  Msk:  Back normal, normal gait. Normal strength and tone for age. Extremities: No clubbing, cyanosis or edema.   Neuro: Alert and oriented X 3. Psych:  Good affect, responds appropriately  Labs:   Lab Results  Component Value Date   WBC 7.5 10/10/2016   HGB 11.6 (L) 10/10/2016   HCT 35.1 (L) 10/10/2016   MCV 84.3 10/10/2016   PLT 138 (L) 10/10/2016    Recent Labs Lab 10/10/16 0123  NA 137  K 4.2  CL 106  CO2 24  BUN 33*  CREATININE 2.35*  CALCIUM 8.2*  PROT 7.4  BILITOT 0.8  ALKPHOS 82  ALT 13*  AST 20  GLUCOSE 214*   Lab Results  Component Value Date   TROPONINI <0.03 10/10/2016   No results found for: CHOL No results found for: HDL No results found for: LDLCALC No results found for: TRIG No results found for: CHOLHDL No results found for: LDLDIRECT    Radiology: Dg Chest 2 View  Result Date: 10/09/2016 CLINICAL DATA:  Acute onset of syncope and vomiting. Cough. Initial encounter. EXAM: CHEST  2 VIEW COMPARISON:  Chest radiograph performed 04/14/2015 FINDINGS: The lungs are well-aerated. Right basilar airspace opacity raises concern for pneumonia. No pleural effusion or pneumothorax is seen. The heart is borderline normal in size. A pacemaker is noted at the left chest wall, with leads ending at the right atrium and right  ventricle. No acute osseous abnormalities are seen. IMPRESSION: Right basilar pneumonia noted. Electronically Signed   By: Garald Balding M.D.   On: 10/09/2016 18:36   US Venous Img Lower Bilateral  Result Date: 10/09/2016 CLINICAL DATA:  Leg swelling.  Hypoxia. EXAM: BILATERAL LOWER EXTREMITY VENOUS DOPPLER ULTRASOUND TECHNIQUE: Gray-scale sonography with graded compression, as well as color Doppler and duplex ultrasound were performed to evaluate the lower extremity deep venous systems from the level of the common femoral vein and including the common femoral, femoral, profunda femoral, popliteal and calf veins including the posterior tibial, peroneal and gastrocnemius veins when visible. The superficial great saphenous vein was  also interrogated. Spectral Doppler was utilized to evaluate flow at rest and with distal augmentation maneuvers in the common femoral, femoral and popliteal veins. COMPARISON:  None. FINDINGS: RIGHT LOWER EXTREMITY Common Femoral Vein: No evidence of thrombus. Normal compressibility, respiratory phasicity and response to augmentation. Saphenofemoral Junction: No evidence of thrombus. Normal compressibility and flow on color Doppler imaging. Profunda Femoral Vein: No evidence of thrombus. Normal compressibility and flow on color Doppler imaging. Femoral Vein: No evidence of thrombus. Normal compressibility, respiratory phasicity and response to augmentation. Popliteal Vein: No evidence of thrombus. Normal compressibility, respiratory phasicity and response to augmentation. Calf Veins: No evidence of thrombus. Normal compressibility and flow on color Doppler imaging. Superficial Great Saphenous Vein: No evidence of thrombus. Normal compressibility and flow on color Doppler imaging. Venous Reflux:  None. Other Findings:  None. LEFT LOWER EXTREMITY Common Femoral Vein: No evidence of thrombus. Normal compressibility, respiratory phasicity and response to augmentation. Saphenofemoral  Junction: No evidence of thrombus. Normal compressibility and flow on color Doppler imaging. Profunda Femoral Vein: No evidence of thrombus. Normal compressibility and flow on color Doppler imaging. Femoral Vein: No evidence of thrombus. Normal compressibility, respiratory phasicity and response to augmentation. Popliteal Vein: No evidence of thrombus. Normal compressibility, respiratory phasicity and response to augmentation. Calf Veins: No evidence of thrombus. Normal compressibility and flow on color Doppler imaging. Superficial Great Saphenous Vein: No evidence of thrombus. Normal compressibility and flow on color Doppler imaging. Venous Reflux:  None. Other Findings:  None. IMPRESSION: No evidence of deep venous thrombosis. Electronically Signed   By: Lajean Manes M.D.   On: 10/09/2016 18:19   Dg Chest Portable 1 View  Result Date: 10/09/2016 CLINICAL DATA:  Dyspnea EXAM: PORTABLE CHEST 1 VIEW COMPARISON:  Chest radiograph from earlier today. FINDINGS: Stable configuration of 2 lead left subclavian pacemaker. Stable cardiomediastinal silhouette with normal heart size and aortic atherosclerosis. No pneumothorax. No pleural effusion. Significant worsening of patchy consolidation at the right lung base. IMPRESSION: Significant worsening of patchy consolidation at the right lung base since this afternoon, most compatible with blooming pneumonia (particularly if the patient received IV hydration). Recommend follow-up chest radiographs to resolution. Electronically Signed   By: Ilona Sorrel M.D.   On: 10/09/2016 21:29    EKG: Sinus rhythm  ASSESSMENT AND PLAN:   1. Paroxysmal atrial fibrillation, previously on aspirin and Plavix, started on Eliquis for stroke prevention 2. Sick sinus syndrome, status post dual-chamber pacemaker 3. Aspiration pneumonia  Recommendations  1. Continue current therapy 2. Continue Eliquis for stroke prevention 3. Stop Plavix 4. Continue metoprolol for rate control 5.  No further cardiac diagnostics at this time  Signed: Isaias Cowman MD,PhD, [facility] 10/10/2016, 11:14 AM

## 2016-10-10 NOTE — Progress Notes (Signed)
ANTICOAGULATION NOTE   Allergies  Allergen Reactions  . Diovan [Valsartan] Cough  . Hydralazine Itching  . Lisinopril Cough    Patient Measurements: Height: 6\' 2"  (188 cm) Weight: 218 lb 0.6 oz (98.9 kg) IBW/kg (Calculated) : 82.2   Vital Signs: Temp: 98.6 F (37 C) (04/01 0800) Temp Source: Oral (04/01 0800) BP: 108/66 (04/01 1019) Pulse Rate: 86 (04/01 1000)  Labs:  Recent Labs  10/09/16 1653 10/09/16 1959 10/10/16 0123 10/10/16 0431  HGB 10.0*  --  11.6*  --   HCT 30.3*  --  35.1*  --   PLT 139*  --  138*  --   CREATININE 2.50* 2.10* 2.35*  --   TROPONINI <0.03 <0.03  --  <0.03    Estimated Creatinine Clearance: 25.7 mL/min (A) (by C-G formula based on SCr of 2.35 mg/dL (H)).   Medical History: Past Medical History:  Diagnosis Date  . AAA (abdominal aortic aneurysm) ([place])   . Arrhythmia   . Diabetes mellitus without complication ([place])   . Heart murmur   . Hyperlipidemia   . Hypertension   . Prostate cancer [facility])     Assessment: 83 y/o M on apixaban for cardiac arrhythmia.   Plan:  Will adjust apixaban dose to 2.5 mg bid for age > 80 and SCr > 1.5.   Ulice Dash D 10/10/2016,10:58 AM

## 2016-10-10 NOTE — Plan of Care (Signed)
Problem: Safety: Goal: Ability to remain free from injury will improve Outcome: Progressing Low bed ordered.  Problem: Activity: Goal: Risk for activity intolerance will decrease Outcome: Progressing Pt up with physical therapy

## 2016-10-10 NOTE — Progress Notes (Addendum)
Pt has remained alert and oriented with no c/o pain. Pt transitioned to [facility] from 50%FiO2 Bipap around 0800. SpO2 has remained <90%. No distress noted. Pt has tolerated breakfast this am eating 75%/426ml. Urine output of 259ml, amber, Cr increased. Will continue to monitor. Cardio consulted d/t syncope episode -Dr Clayborn Bigness requested. Paraschos at bedside. IS given-pt achieving great volume >1500. Pt has a strong, productive cough. Sputum cx obtained.  Pt with orders to transfer to the floor.

## 2016-10-11 ENCOUNTER — Inpatient Hospital Stay
Admit: 2016-10-11 | Discharge: 2016-10-11 | Disposition: A | Discharge status: Home / Self Care | Payer: Medicare Other | Attending: Adult Health | Admitting: Adult Health

## 2016-10-11 LAB — CBC
HEMATOCRIT: 29.9 % — AB (ref 40.0–52.0)
Hemoglobin: 9.6 g/dL — ABNORMAL LOW (ref 13.0–18.0)
MCH: 27.1 pg (ref 26.0–34.0)
MCHC: 32.3 g/dL (ref 32.0–36.0)
MCV: 84.1 fL (ref 80.0–100.0)
PLATELETS: 115 10*3/uL — AB (ref 150–440)
RBC: 3.55 MIL/uL — ABNORMAL LOW (ref 4.40–5.90)
RDW: 16 % — AB (ref 11.5–14.5)
WBC: 11.5 10*3/uL — AB (ref 3.8–10.6)

## 2016-10-11 LAB — ECHOCARDIOGRAM COMPLETE
Height: 74 in
WEIGHTICAEL: 3488.56 [oz_av]

## 2016-10-11 LAB — BASIC METABOLIC PANEL
ANION GAP: 9 (ref 5–15)
BUN: 36 mg/dL — ABNORMAL HIGH (ref 6–20)
CALCIUM: 8.2 mg/dL — AB (ref 8.9–10.3)
CO2: 23 mmol/L (ref 22–32)
CREATININE: 2.34 mg/dL — AB (ref 0.61–1.24)
Chloride: 104 mmol/L (ref 101–111)
GFR, EST AFRICAN AMERICAN: 26 mL/min — AB (ref >60)
GFR, EST NON AFRICAN AMERICAN: 23 mL/min — AB (ref >60)
GLUCOSE: 138 mg/dL — AB (ref 65–99)
Potassium: 3.9 mmol/L (ref 3.5–5.1)
Sodium: 136 mmol/L (ref 135–145)

## 2016-10-11 LAB — GLUCOSE, CAPILLARY
GLUCOSE-CAPILLARY: 213 mg/dL — AB (ref 65–99)
GLUCOSE-CAPILLARY: 165 mg/dL — AB (ref 65–99)
Glucose-Capillary: 134 mg/dL — ABNORMAL HIGH (ref 65–99)
Glucose-Capillary: 208 mg/dL — ABNORMAL HIGH (ref 65–99)
Glucose-Capillary: 166 mg/dL — ABNORMAL HIGH (ref 65–99)

## 2016-10-11 MED ORDER — PIPERACILLIN SOD-TAZOBACTAM SO 2.25 (2-0.25) G IV SOLR
3.3750 g | Freq: Three times a day (TID) | INTRAVENOUS | Status: DC
  Administered 2016-10-11 – 2016-10-12 (×3): 3.375 g via INTRAVENOUS
  Filled 2016-10-11 (×6): qty 3.38

## 2016-10-11 NOTE — Care Management (Signed)
Admitted to this facility with the diagnosis of pneumonia. Lives with wife, Ryan Park. Daughter is Lazarus Salines [phone] or [phone]). Pacemaker placed 04/2015. Discharged with [facility] at that time. No skilled nursing. No home oxygen. Rolling alker, cane, raised toilet seat, and shower bench in the home. Takes care of all basic activities of daily living himself, doesn't drive. No falls. Good appetite. Prescriptions are filled at [facility]. Family will transport. Physical therapy evaluation completed. Recommending home with home health and physical. Discussed agencies with daughter. [place]. Will update Floydene Flock, [facility] representative. Daughter indicated that her father will be going to her home.  Possible discharge Wednesday per Dr. Haydee Monica RN MSN CCM Care Management

## 2016-10-11 NOTE — Progress Notes (Signed)
*  PRELIMINARY RESULTS* Echocardiogram 2D Echocardiogram has been performed.  Sherrie Sport 10/11/2016, 2:24 PM

## 2016-10-11 NOTE — Evaluation (Signed)
{   Clinical/Bedside Swallow Evaluation Patient Details  Name: Blake [name] MRN: [id number] Date of Birth: [birth year]  Today's Date: 10/11/2016 Time: SLP Start Time (ACUTE ONLY): 0940 SLP Stop Time (ACUTE ONLY): 1010 SLP Time Calculation (min) (ACUTE ONLY): 30 min  Past Medical History:  Past Medical History:  Diagnosis Date  . AAA (abdominal aortic aneurysm) ([place])   . Arrhythmia   . Diabetes mellitus without complication ([place])   . Heart murmur   . Hyperlipidemia   . Hypertension   . Prostate cancer [facility])    Past Surgical History:  Past Surgical History:  Procedure Laterality Date  . JOINT REPLACEMENT    . PACEMAKER INSERTION Left 04/14/2015   Procedure: INSERTION PACEMAKER;  Surgeon: Isaias Cowman, MD;  Location: [facility] ORS;  Service: Cardiovascular;  Laterality: Left;  . PROSTATE SURGERY     HPI:      Assessment / Plan / Recommendation Clinical Impression  pt presents with a minimal to mild risk of aspiration as characterized by no overt ssx aspiration during intake of regular with thin trials during BSE,. pt was diagnosed with aspiration pna with episode prior of "violent vomiting" according to the son. SLP educated pt and son on occurance of aspiration during regurgitation.  ST will continue to monitor to ensure safety with current diet.  SLP Visit Diagnosis: Dysphagia, oropharyngeal phase (R13.12)    Aspiration Risk  Mild aspiration risk    Diet Recommendation Regular;Thin liquid   Liquid Administration via: Straw;Cup Medication Administration: Whole meds with liquid Supervision: Patient able to self feed Compensations: Slow rate;Small sips/bites;Follow solids with liquid Postural Changes: Seated upright at 90 degrees    Other  Recommendations     Follow up Recommendations        Frequency and Duration min 2x/week  1 week       Prognosis Prognosis for Safe Diet Advancement: Good      Swallow Study   General Date of Onset: 10/10/16 Type of Study:  Bedside Swallow Evaluation Diet Prior to this Study: Regular;Thin liquids Temperature Spikes Noted: No Respiratory Status: Nasal cannula History of Recent Intubation: No Behavior/Cognition: Alert;Cooperative;Pleasant mood Oral Cavity Assessment: Within Functional Limits Oral Care Completed by SLP: No Oral Cavity - Dentition: Adequate natural dentition Vision: Functional for self-feeding Self-Feeding Abilities: Able to feed self Patient Positioning: Upright in chair Baseline Vocal Quality: Normal Volitional Cough: Strong Volitional Swallow: Able to elicit    Oral/Motor/Sensory Function Overall Oral Motor/Sensory Function: Within functional limits   Ice Chips Ice chips: Within functional limits Presentation: Self Fed;Spoon   Thin Liquid Thin Liquid: Within functional limits Presentation: Cup;Spoon;Straw;Self Fed    Nectar Thick Nectar Thick Liquid: Not tested   Honey Thick Honey Thick Liquid: Not tested   Puree Puree: Within functional limits Presentation: Self Fed;Spoon   Solid   GO   Solid: Within functional limits Presentation: Self Fed;Spoon        [place] Fae Pippin 10/11/2016,1:26 PM

## 2016-10-11 NOTE — Progress Notes (Signed)
[place] at [place] NAME: Blake [name]    MR#:  [id number]  DATE OF BIRTH:  [birth year]  SUBJECTIVE:  CHIEF COMPLAINT:   Chief Complaint  Patient presents with  . Near Syncope   Complained cough and shortness of breath,  Still on O2 [place] 4L. REVIEW OF SYSTEMS:  Review of Systems  Constitutional: Positive for malaise/fatigue. Negative for chills and fever.  HENT: Negative for congestion.   Eyes: Negative for blurred vision and double vision.  Respiratory: Positive for cough, sputum production and shortness of breath. Negative for hemoptysis, wheezing and stridor.   Cardiovascular: Negative for chest pain, palpitations and leg swelling.  Gastrointestinal: Negative for abdominal pain, blood in stool, constipation, diarrhea, melena, nausea and vomiting.  Genitourinary: Negative for dysuria and hematuria.  Musculoskeletal: Negative for back pain.  Skin: Negative for itching and rash.  Neurological: Positive for weakness. Negative for dizziness, focal weakness, loss of consciousness and headaches.  Psychiatric/Behavioral: Negative for depression. The patient is not nervous/anxious.     DRUG ALLERGIES:   Allergies  Allergen Reactions  . Diovan [Valsartan] Cough  . Hydralazine Itching  . Lisinopril Cough   VITALS:  Blood pressure 125/62, pulse 87, temperature 98.3 F (36.8 C), temperature source Oral, resp. rate 18, height 6\' 2"  (1.88 m), weight 218 lb 0.6 oz (98.9 kg), SpO2 90 %. PHYSICAL EXAMINATION:  Physical Exam  Constitutional: He is oriented to person, place, and time and well-developed, well-nourished, and in no distress.  HENT:  Mouth/Throat: Oropharynx is clear and moist.  Eyes: Conjunctivae and EOM are normal.  Neck: Normal range of motion. Neck supple. No JVD present. No tracheal deviation present.  Cardiovascular: Normal rate, regular rhythm and normal heart sounds.  Exam reveals no gallop.   No murmur  heard. Pulmonary/Chest: Effort normal. No respiratory distress. He has no wheezes.  bilateral crackles.  Abdominal: Soft. Bowel sounds are normal. He exhibits no distension. There is no tenderness.  Musculoskeletal: Normal range of motion. He exhibits no edema or tenderness.  Neurological: He is alert and oriented to person, place, and time. No cranial nerve deficit.  Skin: No rash noted. No erythema.  Psychiatric: Affect normal.   LABORATORY PANEL:  Male CBC  Recent Labs Lab 10/11/16 0455  WBC 11.5*  HGB 9.6*  HCT 29.9*  PLT 115*   ------------------------------------------------------------------------------------------------------------------ Chemistries   Recent Labs Lab 10/10/16 0123 10/11/16 0455  NA 137 136  K 4.2 3.9  CL 106 104  CO2 24 23  GLUCOSE 214* 138*  BUN 33* 36*  CREATININE 2.35* 2.34*  CALCIUM 8.2* 8.2*  AST 20  --   ALT 13*  --   ALKPHOS 82  --   BILITOT 0.8  --    RADIOLOGY:  No results found. ASSESSMENT AND PLAN:     Acute hypoxic respiratory failure due to  Aspiration pneumonia. The patient was put on BiPAP last night. Off BiPAP this morning and on O2  4 L. NEB. Continue Zosyn and follow-up cultures. Aspiration precaution.  * Acute on chronic renal failure, CKD stage III    continue IV fluid and follow-up BMP.  * Diabetes on insulin sliding scale coverage.  * Hypertension   Blood pressure is stable, continue home medicines.  * Trigeminal neuralgia   Continue home medicines.  * Paroxysmal atrial fibrillation, previously on aspirin and Plavix, started on Eliquis for stroke prevention Sick sinus syndrome, status post dual-chamber pacemaker.  prolonged QT. resume amiodarone and sotalol  if Dr. Saralyn Pilar agrees.  Per Dr. Saralyn Pilar, Continue metoprolol for rate control and Apixaban.  Stop Plavix, No further cardiac diagnostics at this time. Echo is pending.  Generalized weakness. PT evaluation. All the records are reviewed and  case discussed with Care Management/Social Worker. Management plans discussed with the patient, family and they are in agreement.  CODE STATUS: Full Code  TOTAL TIME TAKING CARE OF THIS PATIENT: 37 minutes.   More than 50% of the time was spent in counseling/coordination of care: YES  POSSIBLE D/C IN 2-3 DAYS, DEPENDING ON CLINICAL CONDITION.   Demetrios Loll M.D on 10/11/2016 at 1:14 PM  Between 7am to 6pm - Pager - [phone]  After 6pm go to [email or web address] - Proofreader  [facility]  Office  [phone]  CC: Primary care physician; Adin Hector, MD  Note: This dictation was prepared with Dragon dictation along with smaller phrase technology. Any transcriptional errors that result from this process are unintentional.

## 2016-10-12 LAB — GLUCOSE, CAPILLARY
GLUCOSE-CAPILLARY: 121 mg/dL — AB (ref 65–99)
GLUCOSE-CAPILLARY: 165 mg/dL — AB (ref 65–99)
Glucose-Capillary: 145 mg/dL — ABNORMAL HIGH (ref 65–99)
Glucose-Capillary: 209 mg/dL — ABNORMAL HIGH (ref 65–99)
Glucose-Capillary: 228 mg/dL — ABNORMAL HIGH (ref 65–99)

## 2016-10-12 LAB — BASIC METABOLIC PANEL
ANION GAP: 7 (ref 5–15)
BUN: 33 mg/dL — ABNORMAL HIGH (ref 6–20)
CHLORIDE: 105 mmol/L (ref 101–111)
CO2: 24 mmol/L (ref 22–32)
CREATININE: 2.25 mg/dL — AB (ref 0.61–1.24)
Calcium: 8.1 mg/dL — ABNORMAL LOW (ref 8.9–10.3)
GFR calc non Af Amer: 24 mL/min — ABNORMAL LOW (ref >60)
GFR, EST AFRICAN AMERICAN: 28 mL/min — AB (ref >60)
GLUCOSE: 152 mg/dL — AB (ref 65–99)
Potassium: 3.5 mmol/L (ref 3.5–5.1)
Sodium: 136 mmol/L (ref 135–145)

## 2016-10-12 MED ORDER — METOPROLOL TARTRATE 25 MG PO TABS
12.5000 mg | ORAL_TABLET | Freq: Two times a day (BID) | ORAL | Status: DC
  Administered 2016-10-12 – 2016-10-13 (×3): 12.5 mg via ORAL
  Filled 2016-10-12 (×3): qty 1

## 2016-10-12 MED ORDER — SODIUM CHLORIDE 0.9 % IV SOLN
3.0000 g | Freq: Two times a day (BID) | INTRAVENOUS | Status: DC
  Administered 2016-10-12 – 2016-10-13 (×2): 3 g via INTRAVENOUS
  Filled 2016-10-12 (×3): qty 3

## 2016-10-12 NOTE — Progress Notes (Addendum)
[place] [facility] [place] NAME: Blake [name]    MR#:  [id number]  DATE OF BIRTH:  September 08, 1922  SUBJECTIVE:  CHIEF COMPLAINT:   Chief Complaint  Patient presents with  . Near Syncope   Better cough and shortness of breath,  Still on O2 [place] 2 L. REVIEW OF SYSTEMS:  Review of Systems  Constitutional: Positive for malaise/fatigue. Negative for chills and fever.  HENT: Negative for congestion.   Eyes: Negative for blurred vision and double vision.  Respiratory: Positive for cough, sputum production and shortness of breath. Negative for hemoptysis, wheezing and stridor.   Cardiovascular: Negative for chest pain, palpitations and leg swelling.  Gastrointestinal: Negative for abdominal pain, blood in stool, constipation, diarrhea, melena, nausea and vomiting.  Genitourinary: Negative for dysuria and hematuria.  Musculoskeletal: Negative for back pain.  Skin: Negative for itching and rash.  Neurological: Positive for weakness. Negative for dizziness, focal weakness, loss of consciousness and headaches.  Psychiatric/Behavioral: Negative for depression. The patient is not nervous/anxious.     DRUG ALLERGIES:   Allergies  Allergen Reactions  . Diovan [Valsartan] Cough  . Hydralazine Itching  . Lisinopril Cough   VITALS:  Blood pressure 139/73, pulse (!) 57, temperature 99.5 F (37.5 C), temperature source Oral, resp. rate 20, height 6\' 2"  (1.88 m), weight 218 lb 0.6 oz (98.9 kg), SpO2 98 %. PHYSICAL EXAMINATION:  Physical Exam  Constitutional: He is oriented to person, place, and time and well-developed, well-nourished, and in no distress.  HENT:  Mouth/Throat: Oropharynx is clear and moist.  Eyes: Conjunctivae and EOM are normal.  Neck: Normal range of motion. Neck supple. No JVD present. No tracheal deviation present.  Cardiovascular: Normal rate, regular rhythm and normal heart sounds.  Exam reveals no gallop.   No murmur  heard. Pulmonary/Chest: Effort normal. No respiratory distress. He has no wheezes.  bilateral crackles.  Abdominal: Soft. Bowel sounds are normal. He exhibits no distension. There is no tenderness.  Musculoskeletal: Normal range of motion. He exhibits no edema or tenderness.  Neurological: He is alert and oriented to person, place, and time. No cranial nerve deficit.  Skin: No rash noted. No erythema.  Psychiatric: Affect normal.   LABORATORY PANEL:  Male CBC  Recent Labs Lab 10/11/16 0455  WBC 11.5*  HGB 9.6*  HCT 29.9*  PLT 115*   ------------------------------------------------------------------------------------------------------------------ Chemistries   Recent Labs Lab 10/10/16 0123  10/12/16 0822  NA 137  < > 136  K 4.2  < > 3.5  CL 106  < > 105  CO2 24  < > 24  GLUCOSE 214*  < > 152*  BUN 33*  < > 33*  CREATININE 2.35*  < > 2.25*  CALCIUM 8.2*  < > 8.1*  AST 20  --   --   ALT 13*  --   --   ALKPHOS 82  --   --   BILITOT 0.8  --   --   < > = values in this interval not displayed. RADIOLOGY:  No results found. ASSESSMENT AND PLAN:     Acute hypoxic respiratory failure due to  Aspiration pneumonia.  Off BiPAP, on O2  2 L. NEB. Disontinue Zosyn, Change to Unasyn since sputum culture showed MODERATE GRAM POSITIVE COCCI IN PAIRS. Aspiration precaution.  * Acute on chronic renal failure, CKD stage III    continue IV fluid and follow-up BMP.  * Diabetes on insulin sliding scale coverage.  * Hypertension  Blood pressure is stable, continue home medicines.  * Trigeminal neuralgia   Continue home medicines.  * Paroxysmal atrial fibrillation, previously on aspirin and Plavix, started on Eliquis for stroke prevention Sick sinus syndrome, status post dual-chamber pacemaker.  prolonged QT. resume amiodarone and sotalol if Dr. Saralyn Pilar agrees.  Per Dr. Saralyn Pilar, Continue metoprolol for rate control and Apixaban.  Stop Plavix, No further cardiac  diagnostics at this time. Echo: Systolic function was normal. The estimated ejection   fraction was in the range of 60% to 65%.  Generalized weakness. PT evaluation: HHPT. All the records are reviewed and case discussed with Care Management/Social Worker. Management plans discussed with the patient, family and they are in agreement.  CODE STATUS: Full Code  TOTAL TIME TAKING CARE OF THIS PATIENT: 33 minutes.   More than 50% of the time was spent in counseling/coordination of care: YES  POSSIBLE D/C IN 2 DAYS, DEPENDING ON CLINICAL CONDITION.   Demetrios Loll M.D on 10/12/2016 at 12:41 PM  Between 7am to 6pm - Pager - [phone]  After 6pm go to [email or web address] - Proofreader  [facility]  Office  [phone]  CC: Primary care physician; Adin Hector, MD  Note: This dictation was prepared with Dragon dictation along with smaller phrase technology. Any transcriptional errors that result from this process are unintentional.

## 2016-10-12 NOTE — Progress Notes (Signed)
[facility]  SUBJECTIVE: Patient states he feels better this morning, denying chest pain, and states his breathing is better.   Vitals:   10/11/16 0805 10/11/16 1348 10/11/16 2047 10/12/16 0423  BP: 125/62 (!) 116/53 127/61 114/61  Pulse: 87 78 78 81  Resp: 18 18 20 20   Temp: 98.3 F (36.8 C) 98.1 F (36.7 C) 98.6 F (37 C) 98.6 F (37 C)  TempSrc: Oral Oral    SpO2: 90% 97% 97% 95%  Weight:      Height:         Intake/Output Summary (Last 24 hours) at 10/12/16 0836 Last data filed at 10/12/16 0746  Gross per 24 hour  Intake          2558.75 ml  Output              550 ml  Net          2008.75 ml      PHYSICAL EXAM  General: Well developed, well nourished, in no acute distress HEENT:  Normocephalic and atramatic Neck:  No JVD.  Lungs: Normal effort of breathing Heart: HRRR . Normal S1 and S2 without gallops or murmurs.  Abdomen: Bowel sounds are positive, abdomen soft and non-tender  Msk:  Patient lying in bed Extremities: No clubbing, cyanosis or edema.   Neuro: Alert and oriented X 3. Psych:  Good affect, responds appropriately   LABS: Basic Metabolic Panel:  Recent Labs  10/10/16 0123 10/11/16 0455  NA 137 136  K 4.2 3.9  CL 106 104  CO2 24 23  GLUCOSE 214* 138*  BUN 33* 36*  CREATININE 2.35* 2.34*  CALCIUM 8.2* 8.2*   Liver Function Tests:  Recent Labs  10/10/16 0123  AST 20  ALT 13*  ALKPHOS 82  BILITOT 0.8  PROT 7.4  ALBUMIN 3.1*   No results for input(s): LIPASE, AMYLASE in the last 72 hours. CBC:  Recent Labs  10/10/16 0123 10/11/16 0455  WBC 7.5 11.5*  HGB 11.6* 9.6*  HCT 35.1* 29.9*  MCV 84.3 84.1  PLT 138* 115*   Cardiac Enzymes:  Recent Labs  10/09/16 1653 10/09/16 1959 10/10/16 0431  TROPONINI <0.03 <0.03 <0.03   BNP: Invalid input(s): POCBNP D-Dimer: No results for input(s): DDIMER in the last 72 hours. Hemoglobin A1C: No results for input(s): HGBA1C in the last 72 hours. Fasting Lipid Panel: No  results for input(s): CHOL, HDL, LDLCALC, TRIG, CHOLHDL, LDLDIRECT in the last 72 hours. Thyroid Function Tests: No results for input(s): TSH, T4TOTAL, T3FREE, THYROIDAB in the last 72 hours.  Invalid input(s): FREET3 Anemia Panel: No results for input(s): VITAMINB12, FOLATE, FERRITIN, TIBC, IRON, RETICCTPCT in the last 72 hours.  No results found.   Echo EF 60-65%  ASSESSMENT AND PLAN:  Principal Problem:   Pneumonia Active Problems:   Aspiration pneumonia (HCC)   Acute respiratory failure (HCC)   Near syncope   Acute renal failure superimposed on chronic kidney disease ([place])    1. Paroxysmal atrial fibrillation, on Eliquis now for stroke prevention 2. Sick sinus syndrome, status post dual-chamber pacemaker 3. Acute hypoxic respiratory failure due aspiration pneumonia, on nasal cannula  Recommendations: 1. Agree with current therapy. 2. Continue Eliquis for stroke prevention 3. Continue metoprolol for rate control 4. No further diagnostics at this time.   Clabe Seal, PA-C 10/12/2016 8:36 AM

## 2016-10-12 NOTE — Progress Notes (Signed)
Pharmacy Antibiotic Note  Blake [name] is a 83 y.o. male admitted on 10/09/2016 with aspiration PNA.  Pharmacy has been consulted for Unasyn dosing.  Plan: Unasyn 3 gm IV Q12H (CrCL approximately 25 to 30 mL/min)  Height: 6\' 2"  (188 cm) Weight: 218 lb 0.6 oz (98.9 kg) IBW/kg (Calculated) : 82.2  Temp (24hrs), Avg:98.7 F (37.1 C), Min:98.2 F (36.8 C), Max:99.5 F (37.5 C)   Recent Labs Lab 10/09/16 1653 10/09/16 1959 10/10/16 0123 10/11/16 0455 10/12/16 0822  WBC 9.2  --  7.5 11.5*  --   CREATININE 2.50* 2.10* 2.35* 2.34* 2.25*    Estimated Creatinine Clearance: 26.9 mL/min (A) (by C-G formula based on SCr of 2.25 mg/dL (H)).    Allergies  Allergen Reactions  . Diovan [Valsartan] Cough  . Hydralazine Itching  . Lisinopril Cough    Thank you for allowing pharmacy to be a part of this patient's care.  Laural Benes, Pharm.D., BCPS Clinical Pharmacist 10/12/2016 3:24 PM

## 2016-10-12 NOTE — Care Management Important Message (Signed)
Important Message  Patient Details  Name: Blake [name] MRN: [id number] Date of Birth: [birth year]   Medicare Important Message Given:  Yes    Shelbie Ammons, RN 10/12/2016, 8:24 AM

## 2016-10-12 NOTE — Progress Notes (Signed)
qPhysical Therapy Treatment Patient Details Name: Blake [name] MRN: [id number] DOB: Apr 27, 1923 Today's Date: 10/12/2016    History of Present Illness Kaulin Chaves is a 83yo balck person identifying as male who comes to [facility] after coughing and SOB after episode of emesis, suspected to have aspirated by family, pt found to have PNA upon arrival. PMH includes DM2, HLD, HTN, PrCA, PPM, trigeminal neuralgia, remote Left TKA, and visual impairment related to Macular Degeneration. At baseline pt performs mostly household distance AMB with RW, WC for community access propelled by caregivers, assisstance during the day from caregivers/family, and Independent in basic ADL.     PT Comments    Pt in bed, ready for session.  Participated in exercises as described below.  To edge of bed with rails and increased time.  He was able to stand with min verbal cues for hand placements.  He was able to ambulate around unit with walker and min guard with assist for O2 tank and tubing.  Generally slow but steady gait.    Pt was on 2 lpm upon entering room at 91%.  Nurse in room and increased to 3 lpm.  Sats 92% after exercises.  After gait sats dipped to 87% on 3 lpm but returned to 91% with seated rest.  Pt reports he did not wear O2 prior to admission.     Follow Up Recommendations  Home health PT;Supervision/Assistance - 24 hour     Equipment Recommendations  None recommended by PT    Recommendations for Other Services       Precautions / Restrictions Precautions Precautions: Fall Restrictions Weight Bearing Restrictions: No    Mobility  Bed Mobility Overal bed mobility: Needs Assistance Bed Mobility: Supine to Sit     Supine to sit: HOB elevated;Min guard     General bed mobility comments: uses rails and increased time  Transfers Overall transfer level: Needs assistance Equipment used: Rolling walker (2 wheeled)   Sit to Stand: Min assist         General transfer comment: verbal  cues for hand placements  Ambulation/Gait Ambulation/Gait assistance: Min guard Ambulation Distance (Feet): 200 Feet Assistive device: Rolling walker (2 wheeled)     Gait velocity interpretation: <1.8 ft/sec, indicative of risk for recurrent falls General Gait Details: generally steady but balance impairments noted and fatigue with longer distances   Stairs            Wheelchair Mobility    Modified Rankin (Stroke Patients Only)       Balance Overall balance assessment: Needs assistance Sitting-balance support: No upper extremity supported;Feet supported Sitting balance-Leahy Scale: Good     Standing balance support: Bilateral upper extremity supported;During functional activity Standing balance-Leahy Scale: Poor Standing balance comment: posterior lean, requires walker out forward excessively for stability.                             Cognition Arousal/Alertness: Awake/alert Behavior During Therapy: WFL for tasks assessed/performed Overall Cognitive Status: Within Functional Limits for tasks assessed                                        Exercises Other Exercises Other Exercises: supine exercises for ankle pumps, heel slides, SLR ab/ad x 10    General Comments        Pertinent Vitals/Pain Pain Assessment: No/denies pain  Home Living                      Prior Function            PT Goals (current goals can now be found in the care plan section) Progress towards PT goals: Progressing toward goals    Frequency    Min 2X/week      PT Plan      Co-evaluation             End of Session Equipment Utilized During Treatment: Gait belt;Oxygen Activity Tolerance: Patient tolerated treatment well Patient left: in chair;with chair alarm set;with call bell/phone within reach Nurse Communication: Mobility status       Time: 5697-9480 PT Time Calculation (min) (ACUTE ONLY): 32 min  Charges:  $Gait  Training: 8-22 mins $Therapeutic Exercise: 23-37 mins                    G Codes:       Chesley Noon, PTA 10/12/16, 11:02 AM

## 2016-10-13 LAB — BASIC METABOLIC PANEL
Anion gap: 8 (ref 5–15)
BUN: 29 mg/dL — AB (ref 6–20)
CALCIUM: 8.1 mg/dL — AB (ref 8.9–10.3)
CO2: 24 mmol/L (ref 22–32)
CREATININE: 2.01 mg/dL — AB (ref 0.61–1.24)
Chloride: 107 mmol/L (ref 101–111)
GFR calc non Af Amer: 27 mL/min — ABNORMAL LOW (ref >60)
GFR, EST AFRICAN AMERICAN: 32 mL/min — AB (ref >60)
Glucose, Bld: 145 mg/dL — ABNORMAL HIGH (ref 65–99)
Potassium: 3.5 mmol/L (ref 3.5–5.1)
SODIUM: 139 mmol/L (ref 135–145)

## 2016-10-13 LAB — CULTURE, RESPIRATORY W GRAM STAIN: Culture: NORMAL

## 2016-10-13 LAB — GLUCOSE, CAPILLARY
GLUCOSE-CAPILLARY: 138 mg/dL — AB (ref 65–99)
GLUCOSE-CAPILLARY: 190 mg/dL — AB (ref 65–99)

## 2016-10-13 LAB — CULTURE, RESPIRATORY

## 2016-10-13 MED ORDER — AMOXICILLIN-POT CLAVULANATE 875-125 MG PO TABS: 1.0000 | ORAL_TABLET | Freq: Two times a day (BID) | ORAL | 0 refills | Status: DC

## 2016-10-13 MED ORDER — METOPROLOL TARTRATE 25 MG PO TABS: 12.5000 mg | ORAL_TABLET | Freq: Two times a day (BID) | ORAL | 2 refills | Status: DC

## 2016-10-13 MED ORDER — AMOXICILLIN-POT CLAVULANATE 875-125 MG PO TABS
1.0000 | ORAL_TABLET | Freq: Two times a day (BID) | ORAL | Status: DC
  Administered 2016-10-13: 15:00:00 1 via ORAL
  Filled 2016-10-13: qty 1

## 2016-10-13 MED ORDER — AMOXICILLIN-POT CLAVULANATE 875-125 MG PO TABS: 1.0000 | ORAL_TABLET | Freq: Every day | ORAL | Status: DC

## 2016-10-13 NOTE — Progress Notes (Signed)
Pt  For discharge home. Alert. 02  In use at 2 liters [place]. Pt to go home on this.  Sl d/cd.  Discharge instructions discussed with pts son. presc given and discussed. Home meds discussed.  Diet/ activity and f/u discussed.  02 tank  Delivered per [employer] .  No voiced co.

## 2016-10-13 NOTE — Discharge Summary (Signed)
[place] at [place] NAME: Blake [name]    MR#:  [id number]  DATE OF BIRTH:  April 07, 1923  DATE OF ADMISSION:  10/09/2016   ADMITTING PHYSICIAN: Vaughan Basta, MD  DATE OF DISCHARGE: 10/13/2016 PRIMARY CARE PHYSICIAN: [name], MD   ADMISSION DIAGNOSIS:  Acute respiratory failure with hypoxia (HCC) [J96.01] Near syncope [R55] Acute renal failure superimposed on chronic kidney disease, unspecified CKD stage, unspecified acute renal failure type ([place]) [N17.9, N18.9] Acute respiratory failure ([place]) [J96.00] DISCHARGE DIAGNOSIS:  Principal Problem:   Pneumonia Active Problems:   Aspiration pneumonia ([place])   Acute respiratory failure ([place])   Near syncope   Acute renal failure superimposed on chronic kidney disease ([place]) Acute hypoxic respiratory failure due to Aspiration pneumonia. Acute on chronic renal failure, CKD stage III SECONDARY DIAGNOSIS:   Past Medical History:  Diagnosis Date  . AAA (abdominal aortic aneurysm) ([place])   . Arrhythmia   . Diabetes mellitus without complication ([place])   . Heart murmur   . Hyperlipidemia   . Hypertension   . Prostate cancer [facility])    HOSPITAL COURSE:   Acute hypoxic respiratory failure due to Aspiration pneumonia.  Off BiPAP, on O2 [place] 2 L. NEB. Unable to wean off O2 [place]. Hypoxia 88% without O2 . He needs home O2 . Disontinue Zosyn, Changed to Unasyn since sputum culture showed MODERATE GRAM POSITIVE COCCI IN PAIRS. Aspiration precaution. Change to augmentin.  * Acute on chronic renal failure, CKD stage III Improving with IV fluid and follow-up BMP as outpatient.  * Diabetes on insulin sliding scale coverage.  * Hypertension Blood pressure is stable, continue home medicines.  * Trigeminal neuralgia Continue home medicines.  * Paroxysmal atrial fibrillation, previously onaspirin and Plavix, started on Eliquisfor stroke prevention Sick sinus syndrome, status  post dual-chamber pacemaker.  prolonged QT.  amiodarone and sotalol are hold. Per Dr. Saralyn Pilar, Continue metoprolol for rate control and Apixaban. Stop Plavix, No further cardiacdiagnostics at this time. Echo: Systolic function was normal. The estimated ejection fraction was in the range of 60% to 65%. Follow up Dr. Saralyn Pilar as outpatient.  Generalized weakness. PT evaluation: HHPT.  DISCHARGE CONDITIONS:   CONSULTS OBTAINED:  Treatment Team:  Yolonda Kida, MD Isaias Cowman, MD DRUG ALLERGIES:   Allergies  Allergen Reactions  . Diovan [Valsartan] Cough  . Hydralazine Itching  . Lisinopril Cough   DISCHARGE MEDICATIONS:   Allergies as of 10/13/2016      Reactions   Diovan [valsartan] Cough   Hydralazine Itching   Lisinopril Cough      Medication List    STOP taking these medications   amiodarone 200 MG tablet Commonly known as:  PACERONE   cephALEXin 250 MG capsule Commonly known as:  KEFLEX   clopidogrel 75 MG tablet Commonly known as:  PLAVIX   sotalol 120 MG tablet Commonly known as:  BETAPACE     TAKE these medications   amLODipine 5 MG tablet Commonly known as:  NORVASC Take 5 mg by mouth daily.   amoxicillin-clavulanate 875-125 MG tablet Commonly known as:  AUGMENTIN Take 1 tablet by mouth every 12 (twelve) hours.   azelastine 0.1 % nasal spray Commonly known as:  ASTELIN Place 1 spray into both nostrils 2 (two) times daily. Use in each nostril as directed   ELIQUIS 5 MG Tabs tablet Generic drug:  apixaban Take 5 mg by mouth 2 (two) times daily.   finasteride 5 MG tablet Commonly known  as:  PROSCAR Take 5 mg by mouth daily.   glimepiride 2 MG tablet Commonly known as:  AMARYL 2 tablets in the morning and one tablet at night What changed:  how much to take  additional instructions   metoprolol tartrate 25 MG tablet Commonly known as:  LOPRESSOR Take 0.5 tablets (12.5 mg total) by mouth 2 (two) times daily.     PRESERVISION/LUTEIN PO Take 1 capsule by mouth 2 (two) times daily.   ranitidine 150 MG tablet Commonly known as:  ZANTAC Take 150 mg by mouth 2 (two) times daily.   rosuvastatin 5 MG tablet Commonly known as:  CRESTOR Take 5 mg by mouth at bedtime.   sitaGLIPtin 50 MG tablet Commonly known as:  JANUVIA Take 50 mg by mouth daily.   tamsulosin 0.4 MG Caps capsule Commonly known as:  FLOMAX Take 0.4 mg by mouth at bedtime.   traMADol 50 MG tablet Commonly known as:  ULTRAM Take by mouth every 6 (six) hours as needed.   triamcinolone cream 0.5 % Commonly known as:  KENALOG Apply 1 application topically 2 (two) times daily.            Durable Medical Equipment        Start     Ordered   10/13/16 1213  For home use only DME oxygen  Once    Question Answer Comment  Mode or (Route) Nasal cannula   Liters per Minute 2   Frequency Continuous (stationary and portable oxygen unit needed)   Oxygen conserving device Yes   Oxygen delivery system Gas      10/13/16 1212       DISCHARGE INSTRUCTIONS:  See AVS.  If you experience worsening of your admission symptoms, develop shortness of breath, life threatening emergency, suicidal or homicidal thoughts you must seek medical attention immediately by calling 911 or calling your MD immediately  if symptoms less severe.  You Must read complete instructions/literature along with all the possible adverse reactions/side effects for all the Medicines you take and that have been prescribed to you. Take any new Medicines after you have completely understood and accpet all the possible adverse reactions/side effects.   Please note  You were cared for by a hospitalist during your hospital stay. If you have any questions about your discharge medications or the care you received while you were in the hospital after you are discharged, you can call the unit and asked to speak with the hospitalist on call if the hospitalist that took care  of you is not available. Once you are discharged, your primary care physician will handle any further medical issues. Please note that NO REFILLS for any discharge medications will be authorized once you are discharged, as it is imperative that you return to your primary care physician (or establish a relationship with a primary care physician if you do not have one) for your aftercare needs so that they can reassess your need for medications and monitor your lab values.    On the day of Discharge:  VITAL SIGNS:  Blood pressure 118/63, pulse 71, temperature 98.6 F (37 C), temperature source Oral, resp. rate 18, height 6\' 2"  (1.88 m), weight 218 lb 0.6 oz (98.9 kg), SpO2 94 %. PHYSICAL EXAMINATION:  GENERAL:  83 y.o.-year-old patient lying in the bed with no acute distress.  EYES: Pupils equal, round, reactive to light and accommodation. No scleral icterus. Extraocular muscles intact.  HEENT: Head atraumatic, normocephalic. Oropharynx and nasopharynx clear.  NECK:  Supple, no jugular venous distention. No thyroid enlargement, no tenderness.  LUNGS: Normal breath sounds bilaterally, no wheezing, mild crackles on bases, no rhonchi or crepitation. No use of accessory muscles of respiration.  CARDIOVASCULAR: S1, S2 normal. No murmurs, rubs, or gallops.  ABDOMEN: Soft, non-tender, non-distended. Bowel sounds present. No organomegaly or mass.  EXTREMITIES: No pedal edema, cyanosis, or clubbing.  NEUROLOGIC: Cranial nerves II through XII are intact. Muscle strength 5/5 in all extremities. Sensation intact. Gait not checked.  PSYCHIATRIC: The patient is alert and oriented x 3.  SKIN: No obvious rash, lesion, or ulcer.  DATA REVIEW:   CBC  Recent Labs Lab 10/11/16 0455  WBC 11.5*  HGB 9.6*  HCT 29.9*  PLT 115*    Chemistries   Recent Labs Lab 10/10/16 0123  10/13/16 0343  NA 137  < > 139  K 4.2  < > 3.5  CL 106  < > 107  CO2 24  < > 24  GLUCOSE 214*  < > 145*  BUN 33*  < > 29*    CREATININE 2.35*  < > 2.01*  CALCIUM 8.2*  < > 8.1*  AST 20  --   --   ALT 13*  --   --   ALKPHOS 82  --   --   BILITOT 0.8  --   --   < > = values in this interval not displayed.   Microbiology Results  Results for orders placed or performed during the hospital encounter of 10/09/16  MRSA PCR Screening     Status: None   Collection Time: 10/09/16 11:51 PM  Result Value Ref Range Status   MRSA by PCR NEGATIVE NEGATIVE Final    Comment:        The GeneXpert MRSA Assay (FDA approved for NASAL specimens only), is one component of a comprehensive MRSA colonization surveillance program. It is not intended to diagnose MRSA infection nor to guide or monitor treatment for MRSA infections.   Culture, expectorated sputum-assessment     Status: None   Collection Time: 10/10/16 11:25 AM  Result Value Ref Range Status   Specimen Description SPUTUM  Final   Special Requests NONE  Final   Sputum evaluation THIS SPECIMEN IS ACCEPTABLE FOR SPUTUM CULTURE  Final   Report Status 10/10/2016 FINAL  Final  Culture, respiratory (NON-Expectorated)     Status: None   Collection Time: 10/10/16 11:25 AM  Result Value Ref Range Status   Specimen Description SPUTUM  Final   Special Requests NONE Reflexed from [id number]  Final   Gram Stain   Final    ABUNDANT WBC PRESENT, PREDOMINANTLY PMN MODERATE GRAM POSITIVE COCCI IN PAIRS    Culture   Final    Consistent with normal respiratory flora. Performed at [facility], [place] [address], [place], [place] [address]    Report Status 10/13/2016 FINAL  Final    RADIOLOGY:  No results found.   Management plans discussed with the patient, son and they are in agreement.  CODE STATUS: Full Code   TOTAL TIME TAKING CARE OF THIS PATIENT: 37 minutes.    Demetrios Loll M.D on 10/13/2016 at 2:31 PM  Between 7am to 6pm - Pager - [phone]  After 6pm go to [email or web address] - Proofreader  [facility]  Office   [phone]  CC: Primary care physician; Adin Hector, MD   Note: This dictation was prepared with Dragon dictation along with smaller phrase technology. Any  transcriptional errors that result from this process are unintentional.

## 2016-10-13 NOTE — Care Management (Addendum)
Qualifies for oxygen but doesn't have a diagnosis that qualifies for home oxygen. Discussed with son at the bedside. Discharge to home today per Dr. Bridgett Larsson. Shelbie Ammons RN MSN CCM Care Management

## 2016-10-13 NOTE — Progress Notes (Signed)
Speech Therapy Note: reviewed chart notes; consulted NSG then pt/family. Pt denied any deficits w/ swallowing or speech-language; NSG indicated pt was tolerating his oral diet and Pills well. NSG to reconsult if any change in status while admitted. Pt agreed.     Orinda Kenner, [place], CCC-SLP

## 2016-10-13 NOTE — Discharge Instructions (Signed)
Heart healthy and ADA diet. Fall precaution.  HHPT. Need home O2 [place] 2L.

## 2016-10-13 NOTE — Progress Notes (Signed)
qPhysical Therapy Treatment Patient Details Name: Blake [name] MRN: [id number] DOB: 13-Apr-1923 Today's Date: 10/13/2016    History of Present Illness Blake [name] is a 83yo balck person identifying as male who comes to [facility] after coughing and SOB after episode of emesis, suspected to have aspirated by family, pt found to have PNA upon arrival. PMH includes DM2, HLD, HTN, PrCA, PPM, trigeminal neuralgia, remote Left TKA, and visual impairment related to Macular Degeneration. At baseline pt performs mostly household distance AMB with RW, WC for community access propelled by caregivers, assisstance during the day from caregivers/family, and Independent in basic ADL.     PT Comments    Pt in bed ready to get up.  Stated he does not feel as well as yesterday.  Sats on 1.5 lpm 92% at rest.  Discussed with primary nurse.  O2 removed to check qualifying O2 sats for home O2.  O2 removed.  Pt to edge of bed with rail and increased time but no physical assist.  Did need verbal cues to scoot forward in bed as he tried to stand with feet dangling and not on floor.  Stood with min a/mod a x 1 and to initially gain balance.  He was incontinent of BM/urine and was cleaned as appropriate prior to gait.  He then was able to ambulate around the nursing unit x 1 with slow but generally steady gait.  O2 sats were monitored throughout session and remained 88-91% during gait and at rest upon return to chair on room air.  Discussed with primary nurse.  O2 left off for the time being and will be checked later.  Pt instructed to call nursing if he felt SOB.  Discussed with son.  Encouraged +1 assist with mobility upon discharge due to weakness and balance deficits.   Follow Up Recommendations  Home health PT;Supervision/Assistance - 24 hour;Supervision for mobility/OOB     Equipment Recommendations  None recommended by PT    Recommendations for Other Services       Precautions / Restrictions  Precautions Precautions: Fall Restrictions Weight Bearing Restrictions: No    Mobility  Bed Mobility Overal bed mobility: Needs Assistance Bed Mobility: Supine to Sit     Supine to sit: Supervision     General bed mobility comments: uses rails and increased time  Transfers Overall transfer level: Needs assistance Equipment used: Rolling walker (2 wheeled) Transfers: Sit to/from Stand Sit to Stand: Min assist;Mod assist         General transfer comment: increased assist needed today with some difficulty maintaining balance upon standing  Ambulation/Gait Ambulation/Gait assistance: Min guard Ambulation Distance (Feet): 200 Feet Assistive device: Rolling walker (2 wheeled) Gait Pattern/deviations: Step-through pattern;Decreased step length - right;Decreased step length - left   Gait velocity interpretation: <1.8 ft/sec, indicative of risk for recurrent falls General Gait Details: generally steady but balance impairments noted and fatigue with longer distances   Science writer    Modified Rankin (Stroke Patients Only)       Balance Overall balance assessment: Needs assistance Sitting-balance support: No upper extremity supported;Feet supported Sitting balance-Leahy Scale: Good     Standing balance support: Bilateral upper extremity supported;During functional activity Standing balance-Leahy Scale: Poor Standing balance comment: initially difficulty obtaining balance but once walking does better  Cognition Arousal/Alertness: Awake/alert Behavior During Therapy: WFL for tasks assessed/performed Overall Cognitive Status: Within Functional Limits for tasks assessed                                        Exercises Other Exercises Other Exercises: stood at bedside for personal care due to incontinent BM/urine with min guard and assist for care    General Comments         Pertinent Vitals/Pain Pain Assessment: No/denies pain    Home Living                      Prior Function            PT Goals (current goals can now be found in the care plan section) Progress towards PT goals: Progressing toward goals    Frequency    Min 2X/week      PT Plan Current plan remains appropriate    Co-evaluation             End of Session Equipment Utilized During Treatment: Gait belt Activity Tolerance: Patient tolerated treatment well Patient left: in chair;with chair alarm set;with call bell/phone within reach;with family/visitor present Nurse Communication: Mobility status;Other (comment)       Time: 2409-7353 PT Time Calculation (min) (ACUTE ONLY): 26 min  Charges:  $Gait Training: 8-22 mins $Therapeutic Activity: 8-22 mins                    G Codes:       Chesley Noon, PTA 10/13/16, 10:44 AM

## 2016-10-13 NOTE — Care Management (Signed)
Spoke with daughter, Lazarus Salines. States her father will be coming to her home today. Address: [address] [place], [place] [address]. Telephone number: [phone] Would like [facility] for nursing services. Floydene Flock, [facility] representative updated.  Shelbie Ammons RN MSN CCM Care Management

## 2016-10-20 ENCOUNTER — Inpatient Hospital Stay
Admission: EM | Admit: 2016-10-20 | Discharge: 2016-10-25 | DRG: 982 | Disposition: A | Discharge status: Home / Self Care | Payer: Medicare Other | Attending: Internal Medicine | Admitting: Internal Medicine

## 2016-10-20 ENCOUNTER — Encounter: Payer: Self-pay | Admitting: Emergency Medicine

## 2016-10-20 DIAGNOSIS — Z950 Presence of cardiac pacemaker: Secondary | ICD-10-CM

## 2016-10-20 DIAGNOSIS — Z8551 Personal history of malignant neoplasm of bladder: Secondary | ICD-10-CM | POA: Diagnosis not present

## 2016-10-20 DIAGNOSIS — I4891 Unspecified atrial fibrillation: Secondary | ICD-10-CM | POA: Diagnosis present

## 2016-10-20 DIAGNOSIS — E785 Hyperlipidemia, unspecified: Secondary | ICD-10-CM | POA: Diagnosis present

## 2016-10-20 DIAGNOSIS — K219 Gastro-esophageal reflux disease without esophagitis: Secondary | ICD-10-CM | POA: Diagnosis present

## 2016-10-20 DIAGNOSIS — Z8546 Personal history of malignant neoplasm of prostate: Secondary | ICD-10-CM

## 2016-10-20 DIAGNOSIS — D62 Acute posthemorrhagic anemia: Secondary | ICD-10-CM | POA: Diagnosis present

## 2016-10-20 DIAGNOSIS — N400 Enlarged prostate without lower urinary tract symptoms: Secondary | ICD-10-CM | POA: Diagnosis present

## 2016-10-20 DIAGNOSIS — Z7901 Long term (current) use of anticoagulants: Secondary | ICD-10-CM | POA: Diagnosis not present

## 2016-10-20 DIAGNOSIS — Z79899 Other long term (current) drug therapy: Secondary | ICD-10-CM

## 2016-10-20 DIAGNOSIS — N183 Chronic kidney disease, stage 3 (moderate): Secondary | ICD-10-CM | POA: Diagnosis present

## 2016-10-20 DIAGNOSIS — K625 Hemorrhage of anus and rectum: Secondary | ICD-10-CM | POA: Diagnosis not present

## 2016-10-20 DIAGNOSIS — Z87891 Personal history of nicotine dependence: Secondary | ICD-10-CM | POA: Diagnosis not present

## 2016-10-20 DIAGNOSIS — D500 Iron deficiency anemia secondary to blood loss (chronic): Secondary | ICD-10-CM | POA: Diagnosis present

## 2016-10-20 DIAGNOSIS — E1122 Type 2 diabetes mellitus with diabetic chronic kidney disease: Secondary | ICD-10-CM | POA: Diagnosis present

## 2016-10-20 DIAGNOSIS — D125 Benign neoplasm of sigmoid colon: Secondary | ICD-10-CM | POA: Diagnosis present

## 2016-10-20 DIAGNOSIS — N179 Acute kidney failure, unspecified: Secondary | ICD-10-CM | POA: Diagnosis present

## 2016-10-20 DIAGNOSIS — K5731 Diverticulosis of large intestine without perforation or abscess with bleeding: Secondary | ICD-10-CM | POA: Diagnosis present

## 2016-10-20 DIAGNOSIS — I129 Hypertensive chronic kidney disease with stage 1 through stage 4 chronic kidney disease, or unspecified chronic kidney disease: Secondary | ICD-10-CM | POA: Diagnosis present

## 2016-10-20 DIAGNOSIS — K922 Gastrointestinal hemorrhage, unspecified: Secondary | ICD-10-CM | POA: Diagnosis present

## 2016-10-20 DIAGNOSIS — R58 Hemorrhage, not elsewhere classified: Secondary | ICD-10-CM

## 2016-10-20 DIAGNOSIS — K921 Melena: Secondary | ICD-10-CM | POA: Diagnosis not present

## 2016-10-20 DIAGNOSIS — I714 Abdominal aortic aneurysm, without rupture: Secondary | ICD-10-CM | POA: Diagnosis present

## 2016-10-20 DIAGNOSIS — T17908A Unspecified foreign body in respiratory tract, part unspecified causing other injury, initial encounter: Secondary | ICD-10-CM

## 2016-10-20 HISTORY — DX: Gastrointestinal hemorrhage, unspecified: K92.2

## 2016-10-20 LAB — BASIC METABOLIC PANEL
ANION GAP: 7 (ref 5–15)
BUN: 30 mg/dL — AB (ref 6–20)
CO2: 25 mmol/L (ref 22–32)
Calcium: 8.4 mg/dL — ABNORMAL LOW (ref 8.9–10.3)
Chloride: 102 mmol/L (ref 101–111)
Creatinine, Ser: 1.98 mg/dL — ABNORMAL HIGH (ref 0.61–1.24)
GFR calc Af Amer: 32 mL/min — ABNORMAL LOW (ref >60)
GFR calc non Af Amer: 28 mL/min — ABNORMAL LOW (ref >60)
GLUCOSE: 216 mg/dL — AB (ref 65–99)
POTASSIUM: 3.7 mmol/L (ref 3.5–5.1)
Sodium: 134 mmol/L — ABNORMAL LOW (ref 135–145)

## 2016-10-20 LAB — PROTIME-INR
INR: 1.39
PROTHROMBIN TIME: 17.2 s — AB (ref 11.4–15.2)

## 2016-10-20 LAB — PREPARE RBC (CROSSMATCH)

## 2016-10-20 LAB — CBC
HEMATOCRIT: 29.2 % — AB (ref 40.0–52.0)
Hemoglobin: 9.5 g/dL — ABNORMAL LOW (ref 13.0–18.0)
MCH: 26.8 pg (ref 26.0–34.0)
MCHC: 32.6 g/dL (ref 32.0–36.0)
MCV: 82.1 fL (ref 80.0–100.0)
Platelets: 173 10*3/uL (ref 150–440)
RBC: 3.55 MIL/uL — ABNORMAL LOW (ref 4.40–5.90)
RDW: 16.2 % — ABNORMAL HIGH (ref 11.5–14.5)
WBC: 9.2 10*3/uL (ref 3.8–10.6)

## 2016-10-20 LAB — HEPARIN LEVEL (UNFRACTIONATED): Heparin Unfractionated: 2.38 IU/mL — ABNORMAL HIGH (ref 0.30–0.70)

## 2016-10-20 LAB — APTT: aPTT: 47 seconds — ABNORMAL HIGH (ref 24–36)

## 2016-10-20 LAB — LACTIC ACID, PLASMA: LACTIC ACID, VENOUS: 2.5 mmol/L — AB (ref 0.5–1.9)

## 2016-10-20 MED ORDER — SODIUM CHLORIDE 0.9 % IV BOLUS (SEPSIS)
1000.0000 mL | Freq: Once | INTRAVENOUS | Status: AC
  Administered 2016-10-20: 1000 mL via INTRAVENOUS

## 2016-10-20 MED ORDER — PROTHROMBIN COMPLEX CONC HUMAN 500 UNITS IV KIT
5000.0000 [IU] | PACK | Status: AC
  Administered 2016-10-20: 5000 [IU] via INTRAVENOUS
  Filled 2016-10-20: qty 200

## 2016-10-20 MED ORDER — SODIUM CHLORIDE 0.9 % IV SOLN
10.0000 mL/h | Freq: Once | INTRAVENOUS | Status: AC
  Administered 2016-10-21: 10 mL/h via INTRAVENOUS

## 2016-10-20 NOTE — ED Provider Notes (Signed)
[facility] Emergency Department Provider Note  ____________________________________________  Time seen: Approximately 11:54 PM  I have reviewed the triage vital signs and the nursing notes.   HISTORY  Chief Complaint Rectal Bleeding    HPI Blake [name] is a 83 y.o. male with a known history of sick sinus s/p pacemaker on Eliquis, diabetes, hyperlipidemia, hypertension, prostate cancer in remission who presents for evaluation of rectal bleeding. Patient has had several episodes starting today of bright red blood per rectum, passing large clots. No abdominal pain. No dizziness, chest pain or shortness of breath. Patient's last Eliquis was this morning. No prior history of GI bleeding. Patient was recently discharged from the hospital 2 weeks ago for aspiration pneumonia.  Past Medical History:  Diagnosis Date  . AAA (abdominal aortic aneurysm) ([place])   . Arrhythmia   . Diabetes mellitus without complication ([place])   . Heart murmur   . Hyperlipidemia   . Hypertension   . Prostate cancer [facility])     Patient Active Problem List   Diagnosis Date Noted  . Lower GI bleed 10/20/2016  . Near syncope   . Acute renal failure superimposed on chronic kidney disease ([place])   . Pneumonia 10/09/2016  . Aspiration pneumonia ([place]) 10/09/2016  . Acute respiratory failure ([place]) 10/09/2016  . AAA (abdominal aortic aneurysm) without rupture ([place]) 04/27/2016  . Arthritis, degenerative 03/31/2016  . A-fib ([place]) 03/31/2016  . HLD (hyperlipidemia) 03/31/2016  . CA of prostate ([place]) 03/31/2016  . SVT (supraventricular tachycardia) ([place]) 04/09/2015  . Diabetes mellitus, type 2 ([place]) 04/09/2015  . Benign hypertension 04/09/2015  . Acute myocardial infarction, initial episode of care 04/09/2015    Past Surgical History:  Procedure Laterality Date  . JOINT REPLACEMENT    . PACEMAKER INSERTION Left 04/14/2015   Procedure: INSERTION PACEMAKER;  Surgeon: Isaias Cowman,  MD;  Location: [facility] ORS;  Service: Cardiovascular;  Laterality: Left;  . PROSTATE SURGERY      Prior to Admission medications   Medication Sig Start Date End Date Taking? Authorizing Provider  amLODipine (NORVASC) 5 MG tablet Take 5 mg by mouth daily.   Yes Historical Provider, MD  apixaban (ELIQUIS) 5 MG TABS tablet Take 5 mg by mouth 2 (two) times daily.   Yes Historical Provider, MD  azelastine (ASTELIN) 0.1 % nasal spray Place 1 spray into both nostrils 2 (two) times daily. Use in each nostril as directed   Yes Historical Provider, MD  finasteride (PROSCAR) 5 MG tablet Take 5 mg by mouth daily.   Yes Historical Provider, MD  glimepiride (AMARYL) 2 MG tablet 2 tablets in the morning and one tablet at night Patient taking differently: 4 mg.  04/15/15  Yes [name], MD  metoprolol tartrate (LOPRESSOR) 25 MG tablet Take 0.5 tablets (12.5 mg total) by mouth 2 (two) times daily. 10/13/16  Yes Demetrios Loll, MD  Multiple Vitamins-Minerals (PRESERVISION/LUTEIN PO) Take 1 capsule by mouth 2 (two) times daily.   Yes Historical Provider, MD  ranitidine (ZANTAC) 150 MG tablet Take 150 mg by mouth 2 (two) times daily.   Yes Historical Provider, MD  rosuvastatin (CRESTOR) 5 MG tablet Take 5 mg by mouth at bedtime.   Yes Historical Provider, MD  sitaGLIPtin (JANUVIA) 50 MG tablet Take 50 mg by mouth daily.   Yes Historical Provider, MD  tamsulosin (FLOMAX) 0.4 MG CAPS capsule Take 0.4 mg by mouth at bedtime.   Yes Historical Provider, MD  traMADol (ULTRAM) 50 MG tablet Take  by mouth every 6 (six) hours as needed.   Yes Historical Provider, MD  triamcinolone cream (KENALOG) 0.5 % Apply 1 application topically 2 (two) times daily.   Yes Historical Provider, MD  amoxicillin-clavulanate (AUGMENTIN) 875-125 MG tablet Take 1 tablet by mouth every 12 (twelve) hours. Patient not taking: Reported on 10/20/2016 10/13/16   Demetrios Loll, MD    Allergies Diovan [valsartan]; Hydralazine; and Lisinopril  Family History    Problem Relation Age of Onset  . Hypertension Other   . Stroke Other   . Heart attack Other   . Stroke Sister   . Heart attack Brother     Social History Social History  Substance Use Topics  . Smoking status: Former Research scientist (life sciences)  . Smokeless tobacco: Never Used  . Alcohol use No    Review of Systems  Constitutional: Negative for fever. Eyes: Negative for visual changes. ENT: Negative for sore throat. Neck: No neck pain  Cardiovascular: Negative for chest pain. Respiratory: Negative for shortness of breath. Gastrointestinal: Negative for abdominal pain, vomiting or diarrhea. Genitourinary: Negative for dysuria. + rectal bleeding Musculoskeletal: Negative for back pain. Skin: Negative for rash. Neurological: Negative for headaches, weakness or numbness. Psych: No SI or HI  ____________________________________________   PHYSICAL EXAM:  VITAL SIGNS: ED Triage Vitals  Enc Vitals Group     BP 10/20/16 2151 131/85     Pulse Rate 10/20/16 2151 80     Resp 10/20/16 2151 20     Temp 10/20/16 2151 97.6 F (36.4 C)     Temp Source 10/20/16 2151 Oral     SpO2 10/20/16 2151 98 %     Weight 10/20/16 2154 223 lb (101.2 kg)     Height 10/20/16 2154 6\' 2"  (1.88 m)     Head Circumference --      Peak Flow --      Pain Score 10/20/16 2150 0     Pain Loc --      Pain Edu? --      Excl. in [place]? --     Constitutional: Alert and oriented. Well appearing and in no apparent distress. HEENT:      Head: Normocephalic and atraumatic.         Eyes: Conjunctivae are normal. Sclera is non-icteric. EOMI. PERRL      Mouth/Throat: Mucous membranes are moist.       Neck: Supple with no signs of meningismus. Cardiovascular: Regular rate and rhythm. No murmurs, gallops, or rubs. 2+ symmetrical distal pulses are present in all extremities. No JVD. Respiratory: Normal respiratory effort. Lungs are clear to auscultation bilaterally. No wheezes, crackles, or rhonchi.  Gastrointestinal: Soft, non  tender, and non distended with positive bowel sounds. No rebound or guarding. Genitourinary: No CVA tenderness.Rectal exam with large amount of bright red blood Musculoskeletal: Nontender with normal range of motion in all extremities. No edema, cyanosis, or erythema of extremities. Neurologic: Normal speech and language. Face is symmetric. Moving all extremities. No gross focal neurologic deficits are appreciated. Skin: Skin is warm, dry and intact. No rash noted. Psychiatric: Mood and affect are normal. Speech and behavior are normal.  ____________________________________________   LABS (all labs ordered are listed, but only abnormal results are displayed)  Labs Reviewed  CBC - Abnormal; Notable for the following:       Result Value   RBC 3.55 (*)    Hemoglobin 9.5 (*)    HCT 29.2 (*)    RDW 16.2 (*)    All other components  within normal limits  BASIC METABOLIC PANEL - Abnormal; Notable for the following:    Sodium 134 (*)    Glucose, Bld 216 (*)    BUN 30 (*)    Creatinine, Ser 1.98 (*)    Calcium 8.4 (*)    GFR calc non Af Amer 28 (*)    GFR calc Af Amer 32 (*)    All other components within normal limits  LACTIC ACID, PLASMA - Abnormal; Notable for the following:    Lactic Acid, Venous 2.5 (*)    All other components within normal limits  PROTIME-INR - Abnormal; Notable for the following:    Prothrombin Time 17.2 (*)    All other components within normal limits  APTT - Abnormal; Notable for the following:    aPTT 47 (*)    All other components within normal limits  HEPARIN LEVEL (UNFRACTIONATED) - Abnormal; Notable for the following:    Heparin Unfractionated 2.38 (*)    All other components within normal limits  PROTIME-INR  APTT  TYPE AND SCREEN  PREPARE RBC (CROSSMATCH)   ____________________________________________  EKG  ED ECG REPORT I, Rudene Re, the attending physician, personally viewed and interpreted this ECG.  Paced rhythm, rate of 82,  prolonged QTC, left axis deviation, no ST elevations or depressions.  ____________________________________________  RADIOLOGY  none  ____________________________________________   PROCEDURES  Procedure(s) performed: None Procedures Critical Care performed:  None ____________________________________________   INITIAL IMPRESSION / ASSESSMENT AND PLAN / ED COURSE   83 y.o. male with a known history of sick sinus s/p pacemaker on Eliquis, diabetes, hyperlipidemia, hypertension, prostate cancer in remission who presents for evaluation of rectal bleeding. Patient hemodynamically stable however with significant amount of bright red blood in his rectal exam. I discussed patient's presentation with Dr. Allen Norris, GI who will see patient and plan for sigmoidoscopy tomorrow. Patient is hemodynamically stable, hemoglobin is stable. K centra given to reverse anticoagulation. Blood and type and screen pending. 2 large-bore IVs have been placed. We'll give gentle hydration. Patient admitted to the hospitalist service.    Pertinent labs & imaging results that were available during my care of the patient were reviewed by me and considered in my medical decision making (see chart for details).    ____________________________________________   FINAL CLINICAL IMPRESSION(S) / ED DIAGNOSES  Final diagnoses:  Lower GI bleed      NEW MEDICATIONS STARTED DURING THIS VISIT:  New Prescriptions   No medications on file     Note:  This document was prepared using Dragon voice recognition software and may include unintentional dictation errors.    Rudene Re, MD 10/21/16 0000

## 2016-10-20 NOTE — ED Notes (Signed)
Lab called to report a critical high lactic acid of 2.5 Dr. Alfred Levins was notified.

## 2016-10-20 NOTE — ED Notes (Signed)
Mallie Mussel RN called pharmacy to obtain the patient's [facility]. They said that it will take around 20 min to prepare.

## 2016-10-20 NOTE — ED Triage Notes (Signed)
Pt arrived to the ED via EMS from home for complaints of rectal bleeding. Pt reports that he had 2 episodes of  Bloody stool with bright red blood and blood clots. Pt reports being on anticoagulants and having a resent admission to the hospital for aspiration pneumonia. Pt is AOx4 in no apparent distress.

## 2016-10-20 NOTE — ED Notes (Signed)
Mallie Mussel RN and Ebony Hail RN helped the Pt to the bathroom and back. Pt had a bloody BM. Pt returned to his bed without difficulty.

## 2016-10-20 NOTE — H&P (Signed)
[place] @ [facility] Admission History and Physical Harvie Bridge, D.O.  ---------------------------------------------------------------------------------------------------------------------   PATIENT NAME: Blake [name] MR#: [id number] DATE OF BIRTH: 08-31-1922 DATE OF ADMISSION: 10/20/2016 PRIMARY CARE PHYSICIAN: Adin Hector, MD  REQUESTING/REFERRING PHYSICIAN: ED Dr. Dr. Alfred Levins PMD: Dr. Ramonita Lab Specialists: Dr. Clayborn Bigness, Dr. Melrose Nakayama, Dr. Cleda Mccreedy, Dr. Jacqlyn Larsen [facility]), Dr. Lucky Cowboy  CHIEF COMPLAINT: Chief Complaint  Patient presents with  . Rectal Bleeding    HISTORY OF PRESENT ILLNESS:  Blake [name] is a 83 y.o. male with a known history of A. fib on Eliquis, sick sinus syndrome status post pacemaker 10/16, AAA, diabetes, hyperlipidemia, hypertension, prostate cancer was in a usual state of health until he was noted to have two episodes of rectal bleeding today. Patient's daughter states he while he was having a bowel movement when they noticed massive amounts of bright red blood mixed in with stool. He had a second bowel movement with dark red clots. He has not had any additional bleeding since then. Otherwise he is feeling well and denies any symptoms such as dizziness, lightheadedness, short of breath or chest pain. He is unsure of when his last colonoscopy was but it has been many years.  Of note patient was previously on aspirin and Plavix however last week his anticoagulant regimen changed to Eliquis only. He did see Dr. Clayborn Bigness with this morning for follow-up. Patient was also hospitalized from 3/31-10/13/16 For acute respiratory failure secondary to aspiration pneumonia, AKI on CKD, near syncope  Otherwise there has been no change in status. Patient has been taking medication as prescribed and there has been no recent change in medication or diet.  There has been no recent illness, travel or sick contacts.     PAST MEDICAL HISTORY: Past Medical History:   Diagnosis Date  . AAA (abdominal aortic aneurysm) ([place])   . Arrhythmia   . Diabetes mellitus without complication ([place])   . Heart murmur   . Hyperlipidemia   . Hypertension   . Prostate cancer ([place])      PAST SURGICAL HISTORY: Past Surgical History:  Procedure Laterality Date  . JOINT REPLACEMENT    . PACEMAKER INSERTION Left 04/14/2015   Procedure: INSERTION PACEMAKER;  Surgeon: Isaias Cowman, MD;  Location: [facility] ORS;  Service: Cardiovascular;  Laterality: Left;  . PROSTATE SURGERY     SOCIAL HISTORY: Social History  Substance Use Topics  . Smoking status: Former Research scientist (life sciences)  . Smokeless tobacco: Never Used  . Alcohol use No   FAMILY HISTORY: Family History  Problem Relation Age of Onset  . Hypertension Other   . Stroke Other   . Heart attack Other   . Stroke Sister   . Heart attack Brother   Father with MI, HTN   MEDICATIONS AT HOME: Prior to Admission medications   Medication Sig Start Date End Date Taking? Authorizing Provider  amLODipine (NORVASC) 5 MG tablet Take 5 mg by mouth daily.   Yes Historical Provider, MD  apixaban (ELIQUIS) 5 MG TABS tablet Take 5 mg by mouth 2 (two) times daily.   Yes Historical Provider, MD  azelastine (ASTELIN) 0.1 % nasal spray Place 1 spray into both nostrils 2 (two) times daily. Use in each nostril as directed   Yes Historical Provider, MD  finasteride (PROSCAR) 5 MG tablet Take 5 mg by mouth daily.   Yes Historical Provider, MD  glimepiride (AMARYL) 2 MG tablet 2 tablets in the morning and one tablet at night Patient taking differently: 4  mg.  04/15/15  Yes [name], MD  metoprolol tartrate (LOPRESSOR) 25 MG tablet Take 0.5 tablets (12.5 mg total) by mouth 2 (two) times daily. 10/13/16  Yes Demetrios Loll, MD  Multiple Vitamins-Minerals (PRESERVISION/LUTEIN PO) Take 1 capsule by mouth 2 (two) times daily.   Yes Historical Provider, MD  ranitidine (ZANTAC) 150 MG tablet Take 150 mg by mouth 2 (two) times daily.   Yes Historical  Provider, MD  rosuvastatin (CRESTOR) 5 MG tablet Take 5 mg by mouth at bedtime.   Yes Historical Provider, MD  sitaGLIPtin (JANUVIA) 50 MG tablet Take 50 mg by mouth daily.   Yes Historical Provider, MD  tamsulosin (FLOMAX) 0.4 MG CAPS capsule Take 0.4 mg by mouth at bedtime.   Yes Historical Provider, MD  traMADol (ULTRAM) 50 MG tablet Take by mouth every 6 (six) hours as needed.   Yes Historical Provider, MD  triamcinolone cream (KENALOG) 0.5 % Apply 1 application topically 2 (two) times daily.   Yes Historical Provider, MD  amoxicillin-clavulanate (AUGMENTIN) 875-125 MG tablet Take 1 tablet by mouth every 12 (twelve) hours. Patient not taking: Reported on 10/20/2016 10/13/16   Demetrios Loll, MD      DRUG ALLERGIES: Allergies  Allergen Reactions  . Diovan [Valsartan] Cough  . Hydralazine Itching  . Lisinopril Cough     REVIEW OF SYSTEMS: CONSTITUTIONAL: No fever/chills, fatigue, weakness, weight gain/loss, headache EYES: No blurry or double vision. ENT: No tinnitus, postnasal drip, redness or soreness of the oropharynx. RESPIRATORY: No cough, wheeze, hemoptysis, dyspnea. CARDIOVASCULAR: No chest pain, orthopnea, palpitations, syncope. GASTROINTESTINAL: No nausea, vomiting, constipation, diarrhea, abdominal pain, hematemesis, melena. Positive hematochezia. GENITOURINARY: No dysuria or hematuria. ENDOCRINE: No polyuria or nocturia. No heat or cold intolerance. HEMATOLOGY: No anemia, bruising, bleeding. INTEGUMENTARY: No rashes, ulcers, lesions. MUSCULOSKELETAL: No arthritis, swelling, gout. NEUROLOGIC: No numbness, tingling, weakness or ataxia. No seizure-type activity. PSYCHIATRIC: No anxiety, depression, insomnia.  PHYSICAL EXAMINATION: VITAL SIGNS: Blood pressure 131/85, pulse 80, temperature 97.6 F (36.4 C), temperature source Oral, resp. rate 20, height 6\' 2"  (1.88 m), weight 101.2 kg (223 lb), SpO2 98 %.  GENERAL: 83 y.o.-year-old male patient, well-developed, well-nourished  lying in the bed in no acute distress.  Pleasant and cooperative.   HEENT: Head atraumatic, normocephalic. Pupils equal, round, reactive to light and accommodation. No scleral icterus. Extraocular muscles intact.  Mucus membranes moist. NECK: Supple, full range of motion. No JVD.  CHEST: Normal breath sounds bilaterally. No wheezing, rales, rhonchi or crackles. No use of accessory muscles of respiration.  CARDIOVASCULAR: S1, S2 normal. No murmurs, rubs, or gallops. Cap refill <2 seconds. ABDOMEN: Soft, nontender, nondistended. No rebound, guarding, rigidity. Normoactive bowel sounds present in all four quadrants. No organomegaly or mass. EXTREMITIES:  No pedal edema, cyanosis, or clubbing. NEUROLOGIC: Cranial nerves II through XII are grossly intact with no focal sensorimotor deficit. Muscle strength 5/5 in all extremities. Sensation intact. Gait not checked. PSYCHIATRIC: The patient is alert and oriented x 3. Normal affect, mood, thought content. SKIN: Warm, dry, and intact without obvious rash, lesion, or ulcer.  LABORATORY PANEL:  CBC  Recent Labs Lab 10/20/16 2200  WBC 9.2  HGB 9.5*  HCT 29.2*  PLT 173   ----------------------------------------------------------------------------------------------------------------- Chemistries  Recent Labs Lab 10/20/16 2200  NA 134*  K 3.7  CL 102  CO2 25  GLUCOSE 216*  BUN 30*  CREATININE 1.98*  CALCIUM 8.4*   ------------------------------------------------------------------------------------------------------------------ Cardiac Enzymes No results for input(s): TROPONINI in the last 168 hours. ------------------------------------------------------------------------------------------------------------------  RADIOLOGY: No results found.  EKG:  IMPRESSION AND PLAN:  This is a 83 y.o. male with a history of  A. fib on Eliquis, sick sinus syndrome status post pacemaker 10/16, AAA, diabetes, hyperlipidemia, hypertension, prostate  cancer  now being admitted with:  #. Lower GI bleed in a patient on Eliquis-H&H and vital signs are stable and patient has not had any ongoing bleeding.  -Admit patient to med-surg with telemetry monitoring -IV Protonix BID -Nothing by mouth -Serial CBCs, transfuse as needed. Typed & crossmatched in ED -KCentra given in ED, discussed with pharmacy.  May give and additional dose of KCentra if bleeding.  Check PT/INR q6hours x 4 and then daily x2.  -GI consult. Dr. Allen Norris contacted by EDP and although he is not on call and we do not have GI coverage, he is available for this patient if needed overnight and will see the patient tomorrow morning.   #. History of hypertension -Continue Lopressor, Norvasc  #. History of diabetes -Accu-Cheks every 4 hours with regular insulin sliding scale coverage -Hold oral hypoglycemics  #. History of prostate/bladder cancer -Continue finasteride, tamsulosin  #. History of hyperlipidemia -Continue Crestor  #. History of GERD -Continue Pepcid in place of Zantac  Admission status: Inpatient IVFs: NS Diet/Nutrition: NPO Fluids: IVNS DVT Px: SCDs and early ambulation. Chemoprophylaxis would be contraindicated at this point secondary to active GI bleeding. Code Status: Full.    All the records are reviewed and case discussed with ED provider. Management plans discussed with the patient and family who express understanding and agree with plan of care.  TOTAL TIME TAKING CARE OF THIS PATIENT: 60 minutes.   Blake [name] D.O. on 10/20/2016 at 10:43 PM Between 7am to 6pm - Pager - [phone] After 6pm go to [email or web address] - [employer]  Hospitalists Office [phone] CC: Primary care physician; Adin Hector, MD   Note: This dictation was prepared with Dragon dictation along with smaller phrase technology. Any transcriptional errors that result from this process are unintentional.

## 2016-10-21 ENCOUNTER — Inpatient Hospital Stay: Payer: Medicare Other | Admitting: Anesthesiology

## 2016-10-21 ENCOUNTER — Encounter
Admission: EM | Disposition: A | Discharge status: Home / Self Care | Payer: Self-pay | Source: Home / Self Care | Attending: Internal Medicine

## 2016-10-21 ENCOUNTER — Inpatient Hospital Stay: Payer: Medicare Other

## 2016-10-21 DIAGNOSIS — K625 Hemorrhage of anus and rectum: Secondary | ICD-10-CM

## 2016-10-21 DIAGNOSIS — K922 Gastrointestinal hemorrhage, unspecified: Secondary | ICD-10-CM

## 2016-10-21 DIAGNOSIS — I4891 Unspecified atrial fibrillation: Secondary | ICD-10-CM

## 2016-10-21 DIAGNOSIS — R58 Hemorrhage, not elsewhere classified: Secondary | ICD-10-CM

## 2016-10-21 DIAGNOSIS — I714 Abdominal aortic aneurysm, without rupture: Secondary | ICD-10-CM

## 2016-10-21 DIAGNOSIS — K921 Melena: Secondary | ICD-10-CM

## 2016-10-21 HISTORY — PX: FLEXIBLE SIGMOIDOSCOPY: SHX5431

## 2016-10-21 LAB — BASIC METABOLIC PANEL
Anion gap: 5 (ref 5–15)
BUN: 31 mg/dL — AB (ref 6–20)
CHLORIDE: 109 mmol/L (ref 101–111)
CO2: 24 mmol/L (ref 22–32)
CREATININE: 1.93 mg/dL — AB (ref 0.61–1.24)
Calcium: 7.9 mg/dL — ABNORMAL LOW (ref 8.9–10.3)
GFR calc Af Amer: 33 mL/min — ABNORMAL LOW (ref >60)
GFR calc non Af Amer: 29 mL/min — ABNORMAL LOW (ref >60)
Glucose, Bld: 117 mg/dL — ABNORMAL HIGH (ref 65–99)
POTASSIUM: 3.7 mmol/L (ref 3.5–5.1)
Sodium: 138 mmol/L (ref 135–145)

## 2016-10-21 LAB — PROTIME-INR
INR: 1.28
INR: 1.33
INR: 1.35
INR: 1.34
PROTHROMBIN TIME: 16.1 s — AB (ref 11.4–15.2)
PROTHROMBIN TIME: 16.6 s — AB (ref 11.4–15.2)
PROTHROMBIN TIME: 16.8 s — AB (ref 11.4–15.2)
Prothrombin Time: 16.7 seconds — ABNORMAL HIGH (ref 11.4–15.2)

## 2016-10-21 LAB — GLUCOSE, CAPILLARY
GLUCOSE-CAPILLARY: 110 mg/dL — AB (ref 65–99)
GLUCOSE-CAPILLARY: 117 mg/dL — AB (ref 65–99)
Glucose-Capillary: 163 mg/dL — ABNORMAL HIGH (ref 65–99)
Glucose-Capillary: 130 mg/dL — ABNORMAL HIGH (ref 65–99)
Glucose-Capillary: 112 mg/dL — ABNORMAL HIGH (ref 65–99)
Glucose-Capillary: 127 mg/dL — ABNORMAL HIGH (ref 65–99)

## 2016-10-21 LAB — CBC
HEMATOCRIT: 21.6 % — AB (ref 40.0–52.0)
HEMATOCRIT: 21.4 % — AB (ref 40.0–52.0)
HEMOGLOBIN: 7 g/dL — AB (ref 13.0–18.0)
Hemoglobin: 7.1 g/dL — ABNORMAL LOW (ref 13.0–18.0)
MCH: 27.2 pg (ref 26.0–34.0)
MCH: 27 pg (ref 26.0–34.0)
MCHC: 32.9 g/dL (ref 32.0–36.0)
MCHC: 32.7 g/dL (ref 32.0–36.0)
MCV: 82.5 fL (ref 80.0–100.0)
MCV: 82.8 fL (ref 80.0–100.0)
PLATELETS: 127 10*3/uL — AB (ref 150–440)
Platelets: 118 10*3/uL — ABNORMAL LOW (ref 150–440)
RBC: 2.62 MIL/uL — AB (ref 4.40–5.90)
RBC: 2.58 MIL/uL — ABNORMAL LOW (ref 4.40–5.90)
RDW: 16 % — AB (ref 11.5–14.5)
RDW: 16.3 % — AB (ref 11.5–14.5)
WBC: 7.2 10*3/uL (ref 3.8–10.6)
WBC: 7.6 10*3/uL (ref 3.8–10.6)

## 2016-10-21 LAB — PREPARE RBC (CROSSMATCH)

## 2016-10-21 LAB — HEMOGLOBIN
HEMOGLOBIN: 7.1 g/dL — AB (ref 13.0–18.0)
Hemoglobin: 7.7 g/dL — ABNORMAL LOW (ref 13.0–18.0)

## 2016-10-21 LAB — HEPARIN LEVEL (UNFRACTIONATED): Heparin Unfractionated: 1.56 IU/mL — ABNORMAL HIGH (ref 0.30–0.70)

## 2016-10-21 LAB — APTT
APTT: 43 s — AB (ref 24–36)
APTT: 41 s — AB (ref 24–36)
aPTT: 35 seconds (ref 24–36)
aPTT: 42 seconds — ABNORMAL HIGH (ref 24–36)

## 2016-10-21 SURGERY — SIGMOIDOSCOPY, FLEXIBLE
Anesthesia: General

## 2016-10-21 MED ORDER — ACETAMINOPHEN 650 MG RE SUPP: 650.0000 mg | Freq: Four times a day (QID) | RECTAL | Status: DC | PRN

## 2016-10-21 MED ORDER — PROPOFOL 500 MG/50ML IV EMUL
INTRAVENOUS | Status: AC
  Filled 2016-10-21: qty 50

## 2016-10-21 MED ORDER — PROPOFOL 500 MG/50ML IV EMUL
INTRAVENOUS | Status: DC | PRN
  Administered 2016-10-21: 75 ug/kg/min via INTRAVENOUS

## 2016-10-21 MED ORDER — SODIUM CHLORIDE 0.9% FLUSH
3.0000 mL | Freq: Two times a day (BID) | INTRAVENOUS | Status: DC
  Administered 2016-10-21 – 2016-10-25 (×9): 3 mL via INTRAVENOUS

## 2016-10-21 MED ORDER — PANTOPRAZOLE SODIUM 40 MG IV SOLR
40.0000 mg | Freq: Two times a day (BID) | INTRAVENOUS | Status: DC
  Administered 2016-10-21 – 2016-10-25 (×9): 40 mg via INTRAVENOUS
  Filled 2016-10-21 (×9): qty 40

## 2016-10-21 MED ORDER — FINASTERIDE 5 MG PO TABS
5.0000 mg | ORAL_TABLET | Freq: Every day | ORAL | Status: DC
  Administered 2016-10-22 – 2016-10-25 (×4): 5 mg via ORAL
  Filled 2016-10-21 (×4): qty 1

## 2016-10-21 MED ORDER — INSULIN ASPART 100 UNIT/ML ~~LOC~~ SOLN
0.0000 [IU] | SUBCUTANEOUS | Status: DC
  Administered 2016-10-21 – 2016-10-22 (×2): 3 [IU] via SUBCUTANEOUS
  Administered 2016-10-22 – 2016-10-23 (×3): 2 [IU] via SUBCUTANEOUS
  Administered 2016-10-23 (×2): 3 [IU] via SUBCUTANEOUS
  Administered 2016-10-23: 1 [IU] via SUBCUTANEOUS
  Administered 2016-10-23: 01:00:00 3 [IU] via SUBCUTANEOUS
  Filled 2016-10-21 (×3): qty 3
  Filled 2016-10-21: qty 2
  Filled 2016-10-21: qty 3
  Filled 2016-10-21 (×2): qty 2
  Filled 2016-10-21: qty 3
  Filled 2016-10-21: qty 1

## 2016-10-21 MED ORDER — AZELASTINE HCL 0.1 % NA SOLN
1.0000 | Freq: Two times a day (BID) | NASAL | Status: DC
  Administered 2016-10-21 – 2016-10-25 (×8): 1 via NASAL
  Filled 2016-10-21: qty 30

## 2016-10-21 MED ORDER — ONDANSETRON HCL 4 MG PO TABS: 4.0000 mg | ORAL_TABLET | Freq: Four times a day (QID) | ORAL | Status: DC | PRN

## 2016-10-21 MED ORDER — TRIAMCINOLONE ACETONIDE 0.5 % EX CREA
1.0000 "application " | TOPICAL_CREAM | Freq: Two times a day (BID) | CUTANEOUS | Status: DC
  Administered 2016-10-21 – 2016-10-25 (×5): 1 via TOPICAL
  Filled 2016-10-21: qty 15

## 2016-10-21 MED ORDER — SODIUM CHLORIDE 0.9 % IV SOLN
INTRAVENOUS | Status: DC
  Administered 2016-10-21 – 2016-10-22 (×3): via INTRAVENOUS

## 2016-10-21 MED ORDER — SODIUM CHLORIDE 0.9 % IV SOLN
Freq: Once | INTRAVENOUS | Status: AC
  Administered 2016-10-21: 18:00:00 via INTRAVENOUS

## 2016-10-21 MED ORDER — ONDANSETRON HCL 4 MG/2ML IJ SOLN: 4.0000 mg | Freq: Four times a day (QID) | INTRAMUSCULAR | Status: DC | PRN

## 2016-10-21 MED ORDER — ACETAMINOPHEN 325 MG PO TABS: 650.0000 mg | ORAL_TABLET | Freq: Four times a day (QID) | ORAL | Status: DC | PRN

## 2016-10-21 MED ORDER — ROSUVASTATIN CALCIUM 5 MG PO TABS
5.0000 mg | ORAL_TABLET | Freq: Every day | ORAL | Status: DC
  Administered 2016-10-23 – 2016-10-24 (×2): 5 mg via ORAL
  Filled 2016-10-21 (×2): qty 1

## 2016-10-21 MED ORDER — TAMSULOSIN HCL 0.4 MG PO CAPS
0.4000 mg | ORAL_CAPSULE | Freq: Every day | ORAL | Status: DC
  Administered 2016-10-23 – 2016-10-24 (×2): 0.4 mg via ORAL
  Filled 2016-10-21 (×2): qty 1

## 2016-10-21 MED ORDER — TECHNETIUM TC 99M-LABELED RED BLOOD CELLS IV KIT
20.0000 | PACK | Freq: Once | INTRAVENOUS | Status: AC | PRN
  Administered 2016-10-21: 14:00:00 21.493 via INTRAVENOUS

## 2016-10-21 MED ORDER — ALBUTEROL SULFATE (2.5 MG/3ML) 0.083% IN NEBU: 2.5000 mg | INHALATION_SOLUTION | Freq: Four times a day (QID) | RESPIRATORY_TRACT | Status: DC | PRN

## 2016-10-21 MED ORDER — SODIUM CHLORIDE 0.9 % IV SOLN
Freq: Once | INTRAVENOUS | Status: AC
  Administered 2016-10-21: 04:00:00 via INTRAVENOUS

## 2016-10-21 MED ORDER — METOPROLOL TARTRATE 25 MG PO TABS
12.5000 mg | ORAL_TABLET | Freq: Two times a day (BID) | ORAL | Status: DC
  Administered 2016-10-22 – 2016-10-25 (×6): 12.5 mg via ORAL
  Filled 2016-10-21 (×6): qty 1

## 2016-10-21 MED ORDER — IPRATROPIUM BROMIDE 0.02 % IN SOLN: 0.5000 mg | Freq: Four times a day (QID) | RESPIRATORY_TRACT | Status: DC | PRN

## 2016-10-21 MED ORDER — OXYCODONE HCL 5 MG PO TABS: 5.0000 mg | ORAL_TABLET | ORAL | Status: DC | PRN

## 2016-10-21 MED ORDER — AMLODIPINE BESYLATE 5 MG PO TABS
5.0000 mg | ORAL_TABLET | Freq: Every day | ORAL | Status: DC
  Filled 2016-10-21: qty 1

## 2016-10-21 MED ORDER — EPHEDRINE SULFATE 50 MG/ML IJ SOLN
INTRAMUSCULAR | Status: DC | PRN
  Administered 2016-10-21 (×3): 5 mg via INTRAVENOUS

## 2016-10-21 MED ORDER — PHENYLEPHRINE HCL 10 MG/ML IJ SOLN
INTRAMUSCULAR | Status: DC | PRN
Start: 2016-10-21 — End: 2016-10-21
  Administered 2016-10-21: 50 ug via INTRAVENOUS

## 2016-10-21 MED ORDER — FAMOTIDINE 20 MG PO TABS
20.0000 mg | ORAL_TABLET | Freq: Every day | ORAL | Status: DC
  Administered 2016-10-22 – 2016-10-25 (×4): 20 mg via ORAL
  Filled 2016-10-21 (×4): qty 1

## 2016-10-21 MED ORDER — TRAMADOL HCL 50 MG PO TABS: 50.0000 mg | ORAL_TABLET | Freq: Four times a day (QID) | ORAL | Status: DC | PRN

## 2016-10-21 NOTE — Consult Note (Signed)
[facility] Consult Note  MRN : [id number]  Blake [name] is a 83 y.o. (1923/02/03) male who presents with chief complaint of  Chief Complaint  Patient presents with  . Rectal Bleeding   History of Present Illness:  Patient is well known to office. Seen as outpatient for AAA - last seen in 04/2016. The patient is a 83 year old male with a past medical history of atrial fibrillation on Eliquis, pacemaker for sick sinus syndrome, DM, HLD, HTN who experienced what sounds like about two episodes of RBPR yesterday. The patient denies any additional bowel movement today or any RBPR. Patient was on ASA and Plavix which was changed to Eliquis last week. Hbg at 9:00am was 7.0, nuclear medicine GI bleeding study suggestive of bleeding in the distal sigmoid colon or rectum, recent vital signs stable, no output recorded. Patient denies any dizziness, chest / abdominal pain, nausea, vomiting or SOB.   Consulted by primary team for possible embolization / vascular surgery recommendations.   Current Facility-Administered Medications  Medication Dose Route Frequency Provider Last Rate Last Dose  . 0.9 %  sodium chloride infusion   Intravenous Continuous Alexis Hugelmeyer, DO 75 mL/hr at 10/21/16 0227    . acetaminophen (TYLENOL) tablet 650 mg  650 mg Oral Q6H PRN Alexis Hugelmeyer, DO       Or  . acetaminophen (TYLENOL) suppository 650 mg  650 mg Rectal Q6H PRN Alexis Hugelmeyer, DO      . albuterol (PROVENTIL) (2.5 MG/3ML) 0.083% nebulizer solution 2.5 mg  2.5 mg Nebulization Q6H PRN Alexis Hugelmeyer, DO      . amLODipine (NORVASC) tablet 5 mg  5 mg Oral Daily Alexis Hugelmeyer, DO      . azelastine (ASTELIN) 0.1 % nasal spray 1 spray  1 spray Each Nare BID Alexis Hugelmeyer, DO      . famotidine (PEPCID) tablet 20 mg  20 mg Oral Daily Alexis Hugelmeyer, DO      . finasteride (PROSCAR) tablet 5 mg  5 mg Oral Daily Alexis Hugelmeyer, DO      . insulin aspart (novoLOG)  injection 0-15 Units  0-15 Units Subcutaneous Q4H Alexis Hugelmeyer, DO   3 Units at 10/21/16 0225  . ipratropium (ATROVENT) nebulizer solution 0.5 mg  0.5 mg Nebulization Q6H PRN Alexis Hugelmeyer, DO      . metoprolol tartrate (LOPRESSOR) tablet 12.5 mg  12.5 mg Oral BID Alexis Hugelmeyer, DO      . ondansetron (ZOFRAN) tablet 4 mg  4 mg Oral Q6H PRN Alexis Hugelmeyer, DO       Or  . ondansetron (ZOFRAN) injection 4 mg  4 mg Intravenous Q6H PRN Alexis Hugelmeyer, DO      . oxyCODONE (Oxy IR/ROXICODONE) immediate release tablet 5 mg  5 mg Oral Q4H PRN Alexis Hugelmeyer, DO      . pantoprazole (PROTONIX) injection 40 mg  40 mg Intravenous Q12H Alexis Hugelmeyer, DO   40 mg at 10/21/16 0225  . rosuvastatin (CRESTOR) tablet 5 mg  5 mg Oral QHS Alexis Hugelmeyer, DO      . sodium chloride flush (NS) 0.9 % injection 3 mL  3 mL Intravenous Q12H Alexis Hugelmeyer, DO   3 mL at 10/21/16 0227  . tamsulosin (FLOMAX) capsule 0.4 mg  0.4 mg Oral QHS Alexis Hugelmeyer, DO      . traMADol (ULTRAM) tablet 50 mg  50 mg Oral Q6H PRN Alexis Hugelmeyer, DO      . triamcinolone cream (  KENALOG) 0.5 % 1 application  1 application Topical BID Harvie Bridge, DO       Past Medical History:  Diagnosis Date  . AAA (abdominal aortic aneurysm) ([place])   . Arrhythmia   . Diabetes mellitus without complication ([place])   . GI bleed   . Heart murmur   . Hyperlipidemia   . Hypertension   . Prostate cancer [facility])    Past Surgical History:  Procedure Laterality Date  . JOINT REPLACEMENT    . PACEMAKER INSERTION Left 04/14/2015   Procedure: INSERTION PACEMAKER;  Surgeon: Isaias Cowman, MD;  Location: [facility] ORS;  Service: Cardiovascular;  Laterality: Left;  . PROSTATE SURGERY     Social History Social History  Substance Use Topics  . Smoking status: Former Research scientist (life sciences)  . Smokeless tobacco: Never Used  . Alcohol use No   Family History Family History  Problem Relation Age of Onset  . Hypertension Other   . Stroke  Other   . Heart attack Other   . Stroke Sister   . Heart attack Brother   Denies family history of aneurysmal disease, PAD or renal disease.  Allergies  Allergen Reactions  . Diovan [Valsartan] Cough  . Hydralazine Itching  . Lisinopril Cough   REVIEW OF SYSTEMS (Negative unless checked)  Constitutional: [] Weight loss  [] Fever  [] Chills Cardiac: [] Chest pain   [] Chest pressure   [] Palpitations   [] Shortness of breath when laying flat   [] Shortness of breath at rest   [] Shortness of breath with exertion. Vascular:  [] Pain in legs with walking   [] Pain in legs at rest   [] Pain in legs when laying flat   [] Claudication   [] Pain in feet when walking  [] Pain in feet at rest  [] Pain in feet when laying flat   [] History of DVT   [] Phlebitis   [] Swelling in legs   [] Varicose veins   [] Non-healing ulcers Pulmonary:   [] Uses home oxygen   [] Productive cough   [] Hemoptysis   [] Wheeze  [] COPD   [] Asthma Neurologic:  [] Dizziness  [] Blackouts   [] Seizures   [] History of stroke   [] History of TIA  [] Aphasia   [] Temporary blindness   [] Dysphagia   [] Weakness or numbness in arms   [] Weakness or numbness in legs Musculoskeletal:  [] Arthritis   [] Joint swelling   [] Joint pain   [] Low back pain Hematologic:  [] Easy bruising  [] Easy bleeding   [] Hypercoagulable state   [x] Anemic  [] Hepatitis Gastrointestinal:  [x] Blood in stool   [] Vomiting blood  [] Gastroesophageal reflux/heartburn   [] Difficulty swallowing. Genitourinary:  [] Chronic kidney disease   [] Difficult urination  [] Frequent urination  [] Burning with urination   [] Blood in urine Skin:  [] Rashes   [] Ulcers   [] Wounds Psychological:  [] History of anxiety   []  History of major depression.  Physical Examination  Vitals:   10/21/16 1428 10/21/16 1440 10/21/16 1454 10/21/16 1509  BP: 112/60 109/65 112/65 114/68  Pulse:      Resp:      Temp:      TempSrc:      SpO2:      Weight:      Height:       Body mass index is 28.37 kg/m. Gen:  WD/WN,  NAD Head: Alexis/AT, No temporalis wasting.  Ear/Nose/Throat: Hearing grossly intact Eyes: Sclera non-icteric, conjunctiva clear Neck: Trachea midline.  No JVD.  Pulmonary:  Good air movement, respirations not labored, equal bilaterally.  Cardiac: Irregularly Irregular. Vascular:  Vessel Right Left  Radial Palpable Palpable  Ulnar Palpable Palpable  Brachial Palpable Palpable  Carotid Palpable, without bruit Palpable, without bruit  Aorta Not palpable N/A  Femoral Palpable Palpable  Popliteal Palpable Palpable  PT Palpable Palpable  DP Palpable Palpable   Gastrointestinal: soft, non-tender/non-distended. No guarding/reflex.  Musculoskeletal: M/S 5/5 throughout.  Extremities without ischemic changes.  No deformity or atrophy.  Neurologic: Sensation grossly intact in extremities.  Symmetrical.  Speech is fluent. Motor exam as listed above. Psychiatric: Judgment intact, Mood & affect appropriate for pt's clinical situation.  CBC Lab Results  Component Value Date   WBC 7.6 10/21/2016   HGB 7.0 (L) 10/21/2016   HCT 21.4 (L) 10/21/2016   MCV 82.8 10/21/2016   PLT 118 (L) 10/21/2016   BMET    Component Value Date/Time   NA 138 10/21/2016 0905   NA 137 06/16/2014 1337   K 3.7 10/21/2016 0905   K 4.3 06/16/2014 1337   CL 109 10/21/2016 0905   CL 100 06/16/2014 1337   CO2 24 10/21/2016 0905   CO2 27 06/16/2014 1337   GLUCOSE 117 (H) 10/21/2016 0905   GLUCOSE 215 (H) 06/16/2014 1337   BUN 31 (H) 10/21/2016 0905   BUN 29 (H) 06/16/2014 1337   CREATININE 1.93 (H) 10/21/2016 0905   CREATININE 1.48 (H) 06/16/2014 1337   CALCIUM 7.9 (L) 10/21/2016 0905   CALCIUM 8.4 (L) 06/16/2014 1337   GFRNONAA 29 (L) 10/21/2016 0905   GFRNONAA 48 (L) 06/16/2014 1337   GFRNONAA 48 (L) 12/29/2012 1722   GFRAA 33 (L) 10/21/2016 0905   GFRAA 58 (L) 06/16/2014 1337   GFRAA 56 (L) 12/29/2012 1722   Estimated Creatinine Clearance: 31.5 mL/min (A) (by C-G formula based on SCr of 1.93 mg/dL  (H)).  COAG Lab Results  Component Value Date   INR 1.33 10/21/2016   INR 1.28 10/21/2016   INR 1.39 10/20/2016   Radiology Dg Chest 2 View  Result Date: 10/09/2016 CLINICAL DATA:  Acute onset of syncope and vomiting. Cough. Initial encounter. EXAM: CHEST  2 VIEW COMPARISON:  Chest radiograph performed 04/14/2015 FINDINGS: The lungs are well-aerated. Right basilar airspace opacity raises concern for pneumonia. No pleural effusion or pneumothorax is seen. The heart is borderline normal in size. A pacemaker is noted at the left chest wall, with leads ending at the right atrium and right ventricle. No acute osseous abnormalities are seen. IMPRESSION: Right basilar pneumonia noted. Electronically Signed   By: Garald Balding M.D.   On: 10/09/2016 18:36   Nm Gi Blood Loss  Result Date: 10/21/2016 CLINICAL DATA:  83 year old male with a history of bright red blood per rectum EXAM: NUCLEAR MEDICINE GASTROINTESTINAL BLEEDING SCAN TECHNIQUE: Sequential abdominal images were obtained following intravenous administration of Tc-63m labeled red blood cells. RADIOPHARMACEUTICALS:  21.5 mCi Tc-71m in-vitro labeled red cells. COMPARISON:  None. FINDINGS: Anterior planar imaging was acquired over the course of 2 hours after administration of radiotracer IV. First our imaging demonstrates adequate appearance of the blood pool, with first pass through liver and heart. Early during the first hour, there is accumulation of radiotracer in the central pelvis at the 6-10 minute interval. This does not have the configuration of urinary bladder, and does persist through the second hour of imaging. IMPRESSION: Nuclear medicine GI bleeding study suggestive of bleeding in the distal sigmoid colon or rectum. These results were called by telephone at the time of interpretation on 10/21/2016 at 4:10 pm to the nurse caring for the patient, Ms Tamala Fothergill who verbally acknowledged these results.  Electronically Signed   By: Corrie Mckusick D.O.   On: 10/21/2016 16:13   US Venous Img Lower Bilateral  Result Date: 10/09/2016 CLINICAL DATA:  Leg swelling.  Hypoxia. EXAM: BILATERAL LOWER EXTREMITY VENOUS DOPPLER ULTRASOUND TECHNIQUE: Gray-scale sonography with graded compression, as well as color Doppler and duplex ultrasound were performed to evaluate the lower extremity deep venous systems from the level of the common femoral vein and including the common femoral, femoral, profunda femoral, popliteal and calf veins including the posterior tibial, peroneal and gastrocnemius veins when visible. The superficial great saphenous vein was also interrogated. Spectral Doppler was utilized to evaluate flow at rest and with distal augmentation maneuvers in the common femoral, femoral and popliteal veins. COMPARISON:  None. FINDINGS: RIGHT LOWER EXTREMITY Common Femoral Vein: No evidence of thrombus. Normal compressibility, respiratory phasicity and response to augmentation. Saphenofemoral Junction: No evidence of thrombus. Normal compressibility and flow on color Doppler imaging. Profunda Femoral Vein: No evidence of thrombus. Normal compressibility and flow on color Doppler imaging. Femoral Vein: No evidence of thrombus. Normal compressibility, respiratory phasicity and response to augmentation. Popliteal Vein: No evidence of thrombus. Normal compressibility, respiratory phasicity and response to augmentation. Calf Veins: No evidence of thrombus. Normal compressibility and flow on color Doppler imaging. Superficial Great Saphenous Vein: No evidence of thrombus. Normal compressibility and flow on color Doppler imaging. Venous Reflux:  None. Other Findings:  None. LEFT LOWER EXTREMITY Common Femoral Vein: No evidence of thrombus. Normal compressibility, respiratory phasicity and response to augmentation. Saphenofemoral Junction: No evidence of thrombus. Normal compressibility and flow on color Doppler imaging. Profunda Femoral Vein: No evidence of  thrombus. Normal compressibility and flow on color Doppler imaging. Femoral Vein: No evidence of thrombus. Normal compressibility, respiratory phasicity and response to augmentation. Popliteal Vein: No evidence of thrombus. Normal compressibility, respiratory phasicity and response to augmentation. Calf Veins: No evidence of thrombus. Normal compressibility and flow on color Doppler imaging. Superficial Great Saphenous Vein: No evidence of thrombus. Normal compressibility and flow on color Doppler imaging. Venous Reflux:  None. Other Findings:  None. IMPRESSION: No evidence of deep venous thrombosis. Electronically Signed   By: Lajean Manes M.D.   On: 10/09/2016 18:19   Dg Chest Portable 1 View  Result Date: 10/09/2016 CLINICAL DATA:  Dyspnea EXAM: PORTABLE CHEST 1 VIEW COMPARISON:  Chest radiograph from earlier today. FINDINGS: Stable configuration of 2 lead left subclavian pacemaker. Stable cardiomediastinal silhouette with normal heart size and aortic atherosclerosis. No pneumothorax. No pleural effusion. Significant worsening of patchy consolidation at the right lung base. IMPRESSION: Significant worsening of patchy consolidation at the right lung base since this afternoon, most compatible with blooming pneumonia (particularly if the patient received IV hydration). Recommend follow-up chest radiographs to resolution. Electronically Signed   By: Ilona Sorrel M.D.   On: 10/09/2016 21:29   Assessment/Plan 83 year old male well known to our clinic, followed for AAA with positive bleeding scan - Stable  1. 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 Patient has not had any additional bloody bowel movements today, vitals are stable, last two H&H's are stable. If multiple bloody bowel movements occur - would consider possible endovascular intervention. Please trend H&H, vitals and urine output. Any signs of bleeding please contact team.  2. AAA: Last duplex in 04/2016 - stable 4.97cm infrarenal AAA.  Follow up every six months. 3.DM: Encouraged good control as its slows the progression of atherosclerotic disease.  Discussed with Dr. Mayme Genta, PA-C  10/21/2016 4:57  PM   

## 2016-10-21 NOTE — Progress Notes (Signed)
[place] at [facility]                                                                                                                                                                                  Patient Demographics   Blake [name], is a 83 y.o. male, DOB - July 07, 1923, AYT:[id number]  Admit date - 10/20/2016   Admitting Physician Harvie Bridge, DO  Outpatient Primary MD for the patient is BERT Briscoe Burns III, MD   LOS - 1  Subjective: Ration was recently in the hospital discharge on eliquis due to atrial fibrillation now returns back with bright red blood per rectum. Patient will be having a sigmoidoscopy later today. He currently denies any chest pain or shortness of breath last known colonoscopy was 20 years prior    Review of Systems:   CONSTITUTIONAL: No documented fever. No fatigue, weakness. No weight gain, no weight loss.  EYES: No blurry or double vision.  ENT: No tinnitus. No postnasal drip. No redness of the oropharynx.  RESPIRATORY: No cough, no wheeze, no hemoptysis. No dyspnea.  CARDIOVASCULAR: No chest pain. No orthopnea. No palpitations. No syncope.  GASTROINTESTINAL: No nausea, no vomiting or diarrhea. No abdominal pain. No melena or hematochezia.Bright red blood per rectum GENITOURINARY: No dysuria or hematuria.  ENDOCRINE: No polyuria or nocturia. No heat or cold intolerance.  HEMATOLOGY: No anemia. No bruising. No bleeding.  INTEGUMENTARY: No rashes. No lesions.  MUSCULOSKELETAL: No arthritis. No swelling. No gout.  NEUROLOGIC: No numbness, tingling, or ataxia. No seizure-type activity.  PSYCHIATRIC: No anxiety. No insomnia. No ADD.    Vitals:   Vitals:   10/21/16 1054 10/21/16 1145 10/21/16 1155 10/21/16 1233  BP: 109/62 (!) 87/57 109/78 110/63  Pulse: 86 87 91 84  Resp: 20 (!) 27 15 16   Temp: 97.8 F (36.6 C) 97.8 F (36.6 C)  97.9 F (36.6 C)  TempSrc: Oral Tympanic  Oral  SpO2: 100% 98% 99% 98%  Weight: 221 lb  (100.2 kg)     Height: 6\' 2"  (1.88 m)       Wt Readings from Last 3 Encounters:  10/21/16 221 lb (100.2 kg)  10/09/16 218 lb 0.6 oz (98.9 kg)  04/27/16 218 lb (98.9 kg)     Intake/Output Summary (Last 24 hours) at 10/21/16 1247 Last data filed at 10/21/16 1135  Gross per 24 hour  Intake          1960.75 ml  Output                0 ml  Net          1960.75 ml    Physical Exam:  GENERAL: Pleasant-appearing in no apparent distress.  HEAD, EYES, EARS, NOSE AND THROAT: Atraumatic, normocephalic. Extraocular muscles are intact. Pupils equal and reactive to light. Sclerae anicteric. No conjunctival injection. No oro-pharyngeal erythema.  NECK: Supple. There is no jugular venous distention. No bruits, no lymphadenopathy, no thyromegaly.  HEART: Regular rate and rhythm,. No murmurs, no rubs, no clicks.  LUNGS: Clear to auscultation bilaterally. No rales or rhonchi. No wheezes.  ABDOMEN: Soft, flat, nontender, nondistended. Has good bowel sounds. No hepatosplenomegaly appreciated.  EXTREMITIES: No evidence of any cyanosis, clubbing, or peripheral edema.  +2 pedal and radial pulses bilaterally.  NEUROLOGIC: The patient is alert, awake, and oriented x3 with no focal motor or sensory deficits appreciated bilaterally.  SKIN: Moist and warm with no rashes appreciated.  Psych: Not anxious, depressed LN: No inguinal LN enlargement    Antibiotics   Anti-infectives    None      Medications   Scheduled Meds: . amLODipine  5 mg Oral Daily  . azelastine  1 spray Each Nare BID  . famotidine  20 mg Oral Daily  . finasteride  5 mg Oral Daily  . insulin aspart  0-15 Units Subcutaneous Q4H  . metoprolol tartrate  12.5 mg Oral BID  . pantoprazole (PROTONIX) IV  40 mg Intravenous Q12H  . rosuvastatin  5 mg Oral QHS  . sodium chloride flush  3 mL Intravenous Q12H  . tamsulosin  0.4 mg Oral QHS  . triamcinolone cream  1 application Topical BID   Continuous Infusions: . sodium chloride 75  mL/hr at 10/21/16 0227   PRN Meds:.acetaminophen **OR** acetaminophen, albuterol, ipratropium, ondansetron **OR** ondansetron (ZOFRAN) IV, oxyCODONE, traMADol   Data Review:   Micro Results No results found for this or any previous visit (from the past 240 hour(s)).  Radiology Reports Dg Chest 2 View  Result Date: 10/09/2016 CLINICAL DATA:  Acute onset of syncope and vomiting. Cough. Initial encounter. EXAM: CHEST  2 VIEW COMPARISON:  Chest radiograph performed 04/14/2015 FINDINGS: The lungs are well-aerated. Right basilar airspace opacity raises concern for pneumonia. No pleural effusion or pneumothorax is seen. The heart is borderline normal in size. A pacemaker is noted at the left chest wall, with leads ending at the right atrium and right ventricle. No acute osseous abnormalities are seen. IMPRESSION: Right basilar pneumonia noted. Electronically Signed   By: Garald Balding M.D.   On: 10/09/2016 18:36   US Venous Img Lower Bilateral  Result Date: 10/09/2016 CLINICAL DATA:  Leg swelling.  Hypoxia. EXAM: BILATERAL LOWER EXTREMITY VENOUS DOPPLER ULTRASOUND TECHNIQUE: Gray-scale sonography with graded compression, as well as color Doppler and duplex ultrasound were performed to evaluate the lower extremity deep venous systems from the level of the common femoral vein and including the common femoral, femoral, profunda femoral, popliteal and calf veins including the posterior tibial, peroneal and gastrocnemius veins when visible. The superficial great saphenous vein was also interrogated. Spectral Doppler was utilized to evaluate flow at rest and with distal augmentation maneuvers in the common femoral, femoral and popliteal veins. COMPARISON:  None. FINDINGS: RIGHT LOWER EXTREMITY Common Femoral Vein: No evidence of thrombus. Normal compressibility, respiratory phasicity and response to augmentation. Saphenofemoral Junction: No evidence of thrombus. Normal compressibility and flow on color Doppler  imaging. Profunda Femoral Vein: No evidence of thrombus. Normal compressibility and flow on color Doppler imaging. Femoral Vein: No evidence of thrombus. Normal compressibility, respiratory phasicity and response to augmentation. Popliteal Vein: No evidence of thrombus. Normal compressibility, respiratory phasicity and response to  augmentation. Calf Veins: No evidence of thrombus. Normal compressibility and flow on color Doppler imaging. Superficial Great Saphenous Vein: No evidence of thrombus. Normal compressibility and flow on color Doppler imaging. Venous Reflux:  None. Other Findings:  None. LEFT LOWER EXTREMITY Common Femoral Vein: No evidence of thrombus. Normal compressibility, respiratory phasicity and response to augmentation. Saphenofemoral Junction: No evidence of thrombus. Normal compressibility and flow on color Doppler imaging. Profunda Femoral Vein: No evidence of thrombus. Normal compressibility and flow on color Doppler imaging. Femoral Vein: No evidence of thrombus. Normal compressibility, respiratory phasicity and response to augmentation. Popliteal Vein: No evidence of thrombus. Normal compressibility, respiratory phasicity and response to augmentation. Calf Veins: No evidence of thrombus. Normal compressibility and flow on color Doppler imaging. Superficial Great Saphenous Vein: No evidence of thrombus. Normal compressibility and flow on color Doppler imaging. Venous Reflux:  None. Other Findings:  None. IMPRESSION: No evidence of deep venous thrombosis. Electronically Signed   By: Lajean Manes M.D.   On: 10/09/2016 18:19   Dg Chest Portable 1 View  Result Date: 10/09/2016 CLINICAL DATA:  Dyspnea EXAM: PORTABLE CHEST 1 VIEW COMPARISON:  Chest radiograph from earlier today. FINDINGS: Stable configuration of 2 lead left subclavian pacemaker. Stable cardiomediastinal silhouette with normal heart size and aortic atherosclerosis. No pneumothorax. No pleural effusion. Significant worsening of  patchy consolidation at the right lung base. IMPRESSION: Significant worsening of patchy consolidation at the right lung base since this afternoon, most compatible with blooming pneumonia (particularly if the patient received IV hydration). Recommend follow-up chest radiographs to resolution. Electronically Signed   By: Ilona Sorrel M.D.   On: 10/09/2016 21:29     CBC  Recent Labs Lab 10/20/16 2200 10/21/16 0202 10/21/16 0905  WBC 9.2 7.2 7.6  HGB 9.5* 7.1* 7.0*  HCT 29.2* 21.6* 21.4*  PLT 173 127* 118*  MCV 82.1 82.5 82.8  MCH 26.8 27.2 27.0  MCHC 32.6 32.9 32.7  RDW 16.2* 16.0* 16.3*    Chemistries   Recent Labs Lab 10/20/16 2200 10/21/16 0905  NA 134* 138  K 3.7 3.7  CL 102 109  CO2 25 24  GLUCOSE 216* 117*  BUN 30* 31*  CREATININE 1.98* 1.93*  CALCIUM 8.4* 7.9*   ------------------------------------------------------------------------------------------------------------------ estimated creatinine clearance is 31.5 mL/min (A) (by C-G formula based on SCr of 1.93 mg/dL (H)). ------------------------------------------------------------------------------------------------------------------ No results for input(s): HGBA1C in the last 72 hours. ------------------------------------------------------------------------------------------------------------------ No results for input(s): CHOL, HDL, LDLCALC, TRIG, CHOLHDL, LDLDIRECT in the last 72 hours. ------------------------------------------------------------------------------------------------------------------ No results for input(s): TSH, T4TOTAL, T3FREE, THYROIDAB in the last 72 hours.  Invalid input(s): FREET3 ------------------------------------------------------------------------------------------------------------------ No results for input(s): VITAMINB12, FOLATE, FERRITIN, TIBC, IRON, RETICCTPCT in the last 72 hours.  Coagulation profile  Recent Labs Lab 10/20/16 2200 10/21/16 0202 10/21/16 0905  INR 1.39  1.28 1.33    No results for input(s): DDIMER in the last 72 hours.  Cardiac Enzymes No results for input(s): CKMB, TROPONINI, MYOGLOBIN in the last 168 hours.  Invalid input(s): CK ------------------------------------------------------------------------------------------------------------------ Invalid input(s): Schleicher   This is a 83 y.o. male with a history of  A. fib on Eliquis, sick sinus syndrome status post pacemaker 10/16, AAA, diabetes, hyperlipidemia, hypertension, prostate cancer  now being admitted with:  #. Lower GI bleedWith acute on chronic blood loss anemia in a patient on Eliquis- Suspect due to diverticular bleed plan for sigmoidoscopy later today Patient will be receiving another unit of packed RBCs May need a tagged RBC scan   #.  History of hypertension -Continue Lopressor, Norvasc  #. History of diabetes -Accu-Cheks every 4 hours with regular insulin sliding scale coverage -Hold oral hypoglycemics  #. History of prostate/bladder cancer -Continue finasteride, tamsulosin  #. History of hyperlipidemia -Continue Crestor  #. History of GERD     Code Status Orders        Start     Ordered   10/21/16 0155  Full code  Continuous     10/21/16 0154    Code Status History    Date Active Date Inactive Code Status Order ID Comments User Context   10/09/2016 11:42 PM 10/13/2016  8:09 PM Full Code [id number]  Vaughan Basta, MD Inpatient   04/14/2015  2:32 PM 04/15/2015  5:39 PM Full Code [id number]  Isaias Cowman, MD Inpatient   04/09/2015 12:22 AM 04/14/2015  2:32 PM Full Code [id number]  Lytle Butte, MD ED    Advance Directive Documentation     Most Recent Value  Type of Advance Directive  Living will, Healthcare Power of Attorney  Pre-existing out of facility DNR order (yellow form or pink MOST form)  -  "MOST" Form in Place?  -           Consults  gi  DVT Prophylaxis scd's  Lab Results  Component Value  Date   PLT 118 (L) 10/21/2016     Time Spent in minutes   36min  Greater than 50% of time spent in care coordination and counseling patient regarding the condition and plan of care.   Dustin Flock M.D on 10/21/2016 at 12:47 PM  Between 7am to 6pm - Pager - [phone]  After 6pm go to [email or web address] - password [email or web address] [facility]   Office  [phone]

## 2016-10-21 NOTE — Transfer of Care (Signed)
Immediate Anesthesia Transfer of Care Note  Patient: Blake [name]  Procedure(s) Performed: Procedure(s): FLEXIBLE SIGMOIDOSCOPY (N/A)  Patient Location: PACU  Anesthesia Type:General  Level of Consciousness: awake and sedated  Airway & Oxygen Therapy: Patient Spontanous Breathing and Patient connected to nasal cannula oxygen  Post-op Assessment: Report given to RN and Post -op Vital signs reviewed and stable  Post vital signs: Reviewed and stable  Last Vitals:  Vitals:   10/21/16 0450 10/21/16 1054  BP: (!) 95/54 109/62  Pulse: 74 86  Resp: 18 20  Temp: 36.6 C 36.6 C    Last Pain:  Vitals:   10/21/16 1054  TempSrc: Oral  PainSc:       Patients Stated Pain Goal: 0 (07/05/82 4621)  Complications: No apparent anesthesia complications

## 2016-10-21 NOTE — Progress Notes (Signed)
Spoke with MD about a diet order for patient.  Dr. Edwina Barth said to keep pt NPO as long as there is an active bleed.

## 2016-10-21 NOTE — Progress Notes (Signed)
MEDICATION RELATED CONSULT NOTE - INITIAL   Pharmacy Consult for Kcentra Indication: Rectal bleeding  Allergies  Allergen Reactions  . Diovan [Valsartan] Cough  . Hydralazine Itching  . Lisinopril Cough    Patient Measurements: Height: 6\' 2"  (188 cm) Weight: 221 lb (100.2 kg) IBW/kg (Calculated) : 82.2 Adjusted Body Weight:   Vital Signs: Temp: 97.8 F (36.6 C) (04/12 0450) Temp Source: Oral (04/12 0450) BP: 95/54 (04/12 0450) Pulse Rate: 74 (04/12 0450) Intake/Output from previous day: 04/11 0701 - 04/12 0700 In: 1208.8 [I.V.:208.8; IV Piggyback:1000] Out: -  Intake/Output from this shift: Total I/O In: 1208.8 [I.V.:208.8; IV Piggyback:1000] Out: -   Labs:  Recent Labs  10/20/16 2200 10/21/16 0202  WBC 9.2 7.2  HGB 9.5* 7.1*  HCT 29.2* 21.6*  PLT 173 127*  APTT 47* 43*  CREATININE 1.98*  --    Estimated Creatinine Clearance: 30.7 mL/min (A) (by C-G formula based on SCr of 1.98 mg/dL (H)).   Microbiology: Recent Results (from the past 720 hour(s))  MRSA PCR Screening     Status: None   Collection Time: 10/09/16 11:51 PM  Result Value Ref Range Status   MRSA by PCR NEGATIVE NEGATIVE Final    Comment:        The GeneXpert MRSA Assay (FDA approved for NASAL specimens only), is one component of a comprehensive MRSA colonization surveillance program. It is not intended to diagnose MRSA infection nor to guide or monitor treatment for MRSA infections.   Culture, expectorated sputum-assessment     Status: None   Collection Time: 10/10/16 11:25 AM  Result Value Ref Range Status   Specimen Description SPUTUM  Final   Special Requests NONE  Final   Sputum evaluation THIS SPECIMEN IS ACCEPTABLE FOR SPUTUM CULTURE  Final   Report Status 10/10/2016 FINAL  Final  Culture, respiratory (NON-Expectorated)     Status: None   Collection Time: 10/10/16 11:25 AM  Result Value Ref Range Status   Specimen Description SPUTUM  Final   Special Requests NONE Reflexed  from [id number]  Final   Gram Stain   Final    ABUNDANT WBC PRESENT, PREDOMINANTLY PMN MODERATE GRAM POSITIVE COCCI IN PAIRS    Culture   Final    Consistent with normal respiratory flora. Performed at [facility], [place] [address] [place],  [address]    Report Status 10/13/2016 FINAL  Final    Medical History: Past Medical History:  Diagnosis Date  . AAA (abdominal aortic aneurysm) ([place])   . Arrhythmia   . Diabetes mellitus without complication ([place])   . Heart murmur   . Hyperlipidemia   . Hypertension   . Prostate cancer ([facility])     Medications:  Scheduled:  . amLODipine  5 mg Oral Daily  . azelastine  1 spray Each Nare BID  . famotidine  20 mg Oral Daily  . finasteride  5 mg Oral Daily  . insulin aspart  0-15 Units Subcutaneous Q4H  . metoprolol tartrate  12.5 mg Oral BID  . pantoprazole (PROTONIX) IV  40 mg Intravenous Q12H  . rosuvastatin  5 mg Oral QHS  . sodium chloride flush  3 mL Intravenous Q12H  . tamsulosin  0.4 mg Oral QHS  . triamcinolone cream  1 application Topical BID    Assessment: Patient is in apixaban 5 mg bid at home. Baseline PT/INR WNL, aPTT 47, HL 2.38.  Goal of Therapy:  Reversal of rectal bleeding and normalization of coagulation studies  Plan:  Will administer Kcentra 50 units/kg (5000 units). Will check repeat HL, CBC q6h x 4, then daily x 2.  Thank you for this consult.  Tobie Lords, PharmD, BCPS Clinical Pharmacist 10/21/2016

## 2016-10-21 NOTE — Progress Notes (Signed)
[facility]  Patient Status: Active  [facility] is providing the following services: SN/PT  If patient discharges after hours, please call [phone].   Florene Glen 10/21/2016, 9:41 AM

## 2016-10-21 NOTE — Anesthesia Procedure Notes (Signed)
Performed by: Vaughan Sine Pre-anesthesia Checklist: Patient identified, Emergency Drugs available, Suction available, Patient being monitored and Timeout performed Patient Re-evaluated:Patient Re-evaluated prior to inductionOxygen Delivery Method: Simple face mask Preoxygenation: Pre-oxygenation with 100% oxygen Intubation Type: IV induction Placement Confirmation: CO2 detector and positive ETCO2

## 2016-10-21 NOTE — Anesthesia Postprocedure Evaluation (Signed)
Anesthesia Post Note  Patient: Blake [name]  Procedure(s) Performed: Procedure(s) (LRB): FLEXIBLE SIGMOIDOSCOPY (N/A)  Patient location during evaluation: Endoscopy Anesthesia Type: General Level of consciousness: awake and alert Pain management: pain level controlled Vital Signs Assessment: post-procedure vital signs reviewed and stable Respiratory status: spontaneous breathing, nonlabored ventilation, respiratory function stable and patient connected to nasal cannula oxygen Cardiovascular status: blood pressure returned to baseline and stable Postop Assessment: no signs of nausea or vomiting Anesthetic complications: no     Last Vitals:  Vitals:   10/21/16 1145 10/21/16 1155  BP: (!) 87/57 109/78  Pulse: 87 91  Resp: (!) 27 15  Temp: 36.6 C     Last Pain:  Vitals:   10/21/16 1145  TempSrc: Tympanic  PainSc:                  Martha Clan

## 2016-10-21 NOTE — Progress Notes (Signed)
PT Cancellation Note  Patient Details Name: SELWYN REASON MRN: [id number] DOB: Aug 19, 1922   Cancelled Treatment:    Reason Eval/Treat Not Completed: Patient at procedure or test/unavailable Attempted again this afternoon, he was down in nuclear medicine and then scheduled to have another transfusion after that [facility] was still low 7s).  Will hold PT until tomorrow.  Kreg Shropshire 10/21/2016, 3:43 PM

## 2016-10-21 NOTE — Anesthesia Preprocedure Evaluation (Signed)
Anesthesia Evaluation  Patient identified by MRN, date of birth, ID band Patient awake    Reviewed: Allergy & Precautions, H&P , NPO status , Patient's Chart, lab work & pertinent test results, reviewed documented beta blocker date and time   History of Anesthesia Complications Negative for: history of anesthetic complications  Airway Mallampati: III  TM Distance: >3 FB Neck ROM: full    Dental  (+) Edentulous Upper, Upper Dentures, Partial Lower, Poor Dentition   Pulmonary neg pulmonary ROS, former smoker,           Cardiovascular Exercise Tolerance: Good hypertension, (-) angina+ Peripheral Vascular Disease  (-) CAD, (-) Past MI, (-) Cardiac Stents and (-) CABG + dysrhythmias (Sick sinus syndrome) + pacemaker + Valvular Problems/Murmurs      Neuro/Psych negative neurological ROS  negative psych ROS   GI/Hepatic negative GI ROS, Neg liver ROS,   Endo/Other  diabetes  Renal/GU CRFRenal disease  negative genitourinary   Musculoskeletal   Abdominal   Peds  Hematology negative hematology ROS (+)   Anesthesia Other Findings Past Medical History: No date: AAA (abdominal aortic aneurysm) ([facility]) No date: Arrhythmia No date: Diabetes mellitus without complication ([facility]) No date: GI bleed No date: Heart murmur No date: Hyperlipidemia No date: Hypertension No date: Prostate cancer [facility])  Patient is very sick and gastroenterologist is requesting anesthesia for the flexible sigmoidoscopy as there is concern for oversedation in that context.  Plan is for a propofol general anesthetic.  Reproductive/Obstetrics negative OB ROS                             Anesthesia Physical Anesthesia Plan  ASA: IV  Anesthesia Plan: General   Post-op Pain Management:    Induction:   Airway Management Planned:   Additional Equipment:   Intra-op Plan:   Post-operative Plan:   Informed Consent: I have  reviewed the patients History and Physical, chart, labs and discussed the procedure including the risks, benefits and alternatives for the proposed anesthesia with the patient or authorized representative who has indicated his/her understanding and acceptance.   Dental Advisory Given  Plan Discussed with: Anesthesiologist, CRNA and Surgeon  Anesthesia Plan Comments:         Anesthesia Quick Evaluation

## 2016-10-21 NOTE — Progress Notes (Signed)
PT Cancellation Note  Patient Details Name: Blake [name] MRN: [id number] DOB: 02/03/1923   Cancelled Treatment:    Reason Eval/Treat Not Completed: Patient at procedure or test/unavailable Pt out of room for procedure, spoke with nursing who reports he has gotten transfusion.  Requests to try back this afternoon.  Kreg Shropshire 10/21/2016, 11:11 AM

## 2016-10-21 NOTE — Consult Note (Signed)
Blake Lame, MD [facility]  [address] [place], [place] [address] Phone: [phone] Fax : [phone]  Consultation  Referring Provider:     Dr. Ara Kussmaul Primary Care Physician:  Adin Hector, MD Primary Gastroenterologist:          Reason for Consultation:     Hematochezia  Date of Admission:  10/20/2016 Date of Consultation:  10/21/2016         HPI:   Blake [name] is a 83 y.o. male who was admitted last night to the hospital after having multiple episodes of rectal bleeding at home. The patient came in with a stable heart rate but had been on a beta blocker. The patient has been on blood thinners for cardiac issues. The patient also had bright red blood per rectum without any passage of clots as reported by his daughter who is a doctor at this hospital. The patient had no pain with his rectal bleeding. He also denies ever having rectal bleeding the past. The report overnight was that the patient had a few more episodes of rectal bleeding and dropped his hemoglobin from 9 to 7. The patient has been brought down today for a Flexima sigmoidoscopy  Past Medical History:  Diagnosis Date  . AAA (abdominal aortic aneurysm) ([place])   . Arrhythmia   . Diabetes mellitus without complication ([place])   . GI bleed   . Heart murmur   . Hyperlipidemia   . Hypertension   . Prostate cancer [facility])     Past Surgical History:  Procedure Laterality Date  . JOINT REPLACEMENT    . PACEMAKER INSERTION Left 04/14/2015   Procedure: INSERTION PACEMAKER;  Surgeon: Isaias Cowman, MD;  Location: [facility] ORS;  Service: Cardiovascular;  Laterality: Left;  . PROSTATE SURGERY      Prior to Admission medications   Medication Sig Start Date End Date Taking? Authorizing Provider  amLODipine (NORVASC) 5 MG tablet Take 5 mg by mouth daily.   Yes Historical Provider, MD  apixaban (ELIQUIS) 5 MG TABS tablet Take 5 mg by mouth 2 (two) times daily.   Yes Historical Provider, MD  azelastine (ASTELIN)  0.1 % nasal spray Place 1 spray into both nostrils 2 (two) times daily. Use in each nostril as directed   Yes Historical Provider, MD  finasteride (PROSCAR) 5 MG tablet Take 5 mg by mouth daily.   Yes Historical Provider, MD  glimepiride (AMARYL) 2 MG tablet 2 tablets in the morning and one tablet at night Patient taking differently: 4 mg.  04/15/15  Yes [name], MD  metoprolol tartrate (LOPRESSOR) 25 MG tablet Take 0.5 tablets (12.5 mg total) by mouth 2 (two) times daily. 10/13/16  Yes Demetrios Loll, MD  Multiple Vitamins-Minerals (PRESERVISION/LUTEIN PO) Take 1 capsule by mouth 2 (two) times daily.   Yes Historical Provider, MD  ranitidine (ZANTAC) 150 MG tablet Take 150 mg by mouth 2 (two) times daily.   Yes Historical Provider, MD  rosuvastatin (CRESTOR) 5 MG tablet Take 5 mg by mouth at bedtime.   Yes Historical Provider, MD  sitaGLIPtin (JANUVIA) 50 MG tablet Take 50 mg by mouth daily.   Yes Historical Provider, MD  tamsulosin (FLOMAX) 0.4 MG CAPS capsule Take 0.4 mg by mouth at bedtime.   Yes Historical Provider, MD  traMADol (ULTRAM) 50 MG tablet Take by mouth every 6 (six) hours as needed.   Yes Historical Provider, MD  triamcinolone cream (KENALOG) 0.5 % Apply 1 application topically 2 (two) times  daily.   Yes Historical Provider, MD  amoxicillin-clavulanate (AUGMENTIN) 875-125 MG tablet Take 1 tablet by mouth every 12 (twelve) hours. Patient not taking: Reported on 10/20/2016 10/13/16   Demetrios Loll, MD    Family History  Problem Relation Age of Onset  . Hypertension Other   . Stroke Other   . Heart attack Other   . Stroke Sister   . Heart attack Brother      Social History  Substance Use Topics  . Smoking status: Former Research scientist (life sciences)  . Smokeless tobacco: Never Used  . Alcohol use No    Allergies as of 10/20/2016 - Review Complete 10/20/2016  Allergen Reaction Noted  . Diovan [valsartan] Cough 04/09/2015  . Hydralazine Itching 04/09/2015  . Lisinopril Cough 04/09/2015     Review of Systems:    All systems reviewed and negative except where noted in HPI.   Physical Exam:  Vital signs in last 24 hours: Temp:  [97.6 F (36.4 C)-98.3 F (36.8 C)] 97.8 F (36.6 C) (04/12 1054) Pulse Rate:  [57-86] 86 (04/12 1054) Resp:  [16-20] 20 (04/12 1054) BP: (88-131)/(49-85) 109/62 (04/12 1054) SpO2:  [96 %-100 %] 100 % (04/12 1054) Weight:  [221 lb (100.2 kg)-223 lb (101.2 kg)] 221 lb (100.2 kg) (04/12 1054) Last BM Date: 10/21/16 General:   Pleasant, cooperative in NAD Head:  Normocephalic and atraumatic. Eyes:   No icterus.   Conjunctiva pink. PERRLA. Ears:  Normal auditory acuity. Neck:  Supple; no masses or thyroidomegaly Lungs: Respirations even and unlabored. Lungs clear to auscultation bilaterally.   No wheezes, crackles, or rhonchi.  Heart:  Regular rate and rhythm;  Without murmur, clicks, rubs or gallops Abdomen:  Soft, nondistended, nontender. Normal bowel sounds. No appreciable masses or hepatomegaly.  No rebound or guarding.  Rectal:  Not performed. Msk:  Symmetrical without gross deformities.    Extremities:  Without edema, cyanosis or clubbing. Neurologic:  Alert and oriented x3;  grossly normal neurologically. Skin:  Intact without significant lesions or rashes. Cervical Nodes:  No significant cervical adenopathy. Psych:  Alert and cooperative. Normal affect.  LAB RESULTS:  Recent Labs  10/20/16 2200 10/21/16 0202 10/21/16 0905  WBC 9.2 7.2 7.6  HGB 9.5* 7.1* 7.0*  HCT 29.2* 21.6* 21.4*  PLT 173 127* 118*   BMET  Recent Labs  10/20/16 2200 10/21/16 0905  NA 134* 138  K 3.7 3.7  CL 102 109  CO2 25 24  GLUCOSE 216* 117*  BUN 30* 31*  CREATININE 1.98* 1.93*  CALCIUM 8.4* 7.9*   LFT No results for input(s): PROT, ALBUMIN, AST, ALT, ALKPHOS, BILITOT, BILIDIR, IBILI in the last 72 hours. PT/INR  Recent Labs  10/21/16 0202 10/21/16 0905  LABPROT 16.1* 16.6*  INR 1.28 1.33    STUDIES: No results found.     Impression / Plan:   Blake [name] is a 83 y.o. y/o male with rectal bleeding at home and was admitted to the hospital with hematochezia. The family reports the blood to be bright red. The patient has been given reversal agents for his anticoagulation. The patient will have a flex with sigmoidoscopy to see where his bright red blood per rectum may be coming from. There is no report of any foul-smelling stools despite his recent antibiotic use the differential diagnosis includes C. difficile colitis versus ischemic colitis which is less likely due to the patient not having any abdominal pain. The patient may also have a diverticular bleed versus hemorrhoids. The patient's last colonoscopy was  many years ago and neoplasm will need to be ruled out.  Thank you for involving me in the care of this patient.      LOS: 1 day   Blake Lame, MD  10/21/2016, 10:59 AM   Note: This dictation was prepared with Dragon dictation along with smaller phrase technology. Any transcriptional errors that result from this process are unintentional.

## 2016-10-21 NOTE — Anesthesia Post-op Follow-up Note (Cosign Needed)
Anesthesia QCDR form completed.        

## 2016-10-21 NOTE — Care Management (Signed)
Admitted to this facility with the diagnosis of lower GI bleed. Discharged from [facility] 10/13/16 to daughter's home.  Sees Dr. Caryl Comes as primary care physician. Daughter is Lazarus Salines [phone] or [phone]).  [facility] arranged when discharged 10/13/16 with skilled nursing and physical therapy. Home oxygen per [facility] and arranged last visit. No skilled facility. Rolling walker and cane in the home. Prescriptions are filled at [facility].  NPO. Hgb 7.1 this morning. Shelbie Ammons RN MSN CCM Care Management [phone]

## 2016-10-21 NOTE — Op Note (Signed)
[facility] Gastroenterology Patient Name: Blake [name] Procedure Date: 10/21/2016 10:57 AM MRN: [id number] Account #: [id number] Date of Birth: 01-Sep-1922 Admit Type: Inpatient Age: 83 Room: [facility] ENDO ROOM 4 Gender: Male Note Status: Finalized Procedure:            Flexible Sigmoidoscopy Indications:          Hematochezia Providers:            [name] MD, MD Medicines:            Propofol per Anesthesia, None Complications:        No immediate complications. Procedure:            Pre-Anesthesia Assessment:                       - Prior to the procedure, a History and Physical was                        performed, and patient medications and allergies were                        reviewed. The patient's tolerance of previous                        anesthesia was also reviewed. The risks and benefits of                        the procedure and the sedation options and risks were                        discussed with the patient. All questions were                        answered, and informed consent was obtained. Prior                        Anticoagulants: The patient has taken Eliquis                        (apixaban), last dose was 2 days prior to procedure.                        ASA Grade Assessment: III - A patient with severe                        systemic disease. After reviewing the risks and                        benefits, the patient was deemed in satisfactory                        condition to undergo the procedure.                       After obtaining informed consent, the scope was passed                        under direct vision. The Endoscope was introduced                        through  the anus and advanced to the the splenic                        flexure. The flexible sigmoidoscopy was accomplished                        without difficulty. The patient tolerated the procedure                        well. The quality of the bowel  preparation was poor. Findings:      The perianal and digital rectal examinations were normal.      Red blood was found in the rectum, in the recto-sigmoid colon, in the       sigmoid colon, in the descending colon and at the splenic flexure.      Multiple small and large-mouthed diverticula were found in the sigmoid       colon and descending colon.      A 6 mm polyp was found in the sigmoid colon. The polyp was pedunculated.       Polypectomy was not attempted due to poor endoscopic visualization. Impression:           - Preparation of the colon was poor.                       - Blood in the rectum, in the recto-sigmoid colon, in                        the sigmoid colon, in the descending colon and at the                        splenic flexure.                       - Diverticulosis in the sigmoid colon and in the                        descending colon.                       - One 6 mm polyp in the sigmoid colon. Resection not                        attempted.                       - No specimens collected. Recommendation:       - Do a GI bleeding (tagged RBC) scan today. Procedure Code(s):    --- Professional ---                       [phone], Sigmoidoscopy, flexible; diagnostic, including                        collection of specimen(s) by brushing or washing, when                        performed (separate procedure) Diagnosis Code(s):    --- Professional ---                       K92.1, Melena (includes Hematochezia)  K62.5, Hemorrhage of anus and rectum                       K92.2, Gastrointestinal hemorrhage, unspecified CPT copyright 2016 American Medical Association. All rights reserved. The codes documented in this report are preliminary and upon coder review may  be revised to meet current compliance requirements. [name] MD, MD 10/21/2016 11:39:17 AM This report has been signed electronically. Number of Addenda: 0 Note Initiated On: 10/21/2016  10:57 AM Total Procedure Duration: 0 hours 18 minutes 8 seconds       [facility]

## 2016-10-22 ENCOUNTER — Encounter: Payer: Self-pay | Admitting: Gastroenterology

## 2016-10-22 ENCOUNTER — Inpatient Hospital Stay: Payer: Medicare Other

## 2016-10-22 ENCOUNTER — Encounter
Admission: EM | Disposition: A | Discharge status: Home / Self Care | Payer: Self-pay | Source: Home / Self Care | Attending: Internal Medicine

## 2016-10-22 DIAGNOSIS — K922 Gastrointestinal hemorrhage, unspecified: Secondary | ICD-10-CM

## 2016-10-22 HISTORY — PX: VISCERAL ARTERY INTERVENTION: CATH118277

## 2016-10-22 LAB — HEMOGLOBIN: Hemoglobin: 8.5 g/dL — ABNORMAL LOW (ref 13.0–18.0)

## 2016-10-22 LAB — BASIC METABOLIC PANEL
Anion gap: 4 — ABNORMAL LOW (ref 5–15)
BUN: 26 mg/dL — ABNORMAL HIGH (ref 6–20)
CALCIUM: 7.9 mg/dL — AB (ref 8.9–10.3)
CO2: 25 mmol/L (ref 22–32)
CREATININE: 1.7 mg/dL — AB (ref 0.61–1.24)
Chloride: 111 mmol/L (ref 101–111)
GFR calc non Af Amer: 33 mL/min — ABNORMAL LOW (ref >60)
GFR, EST AFRICAN AMERICAN: 39 mL/min — AB (ref >60)
GLUCOSE: 126 mg/dL — AB (ref 65–99)
Potassium: 3.9 mmol/L (ref 3.5–5.1)
Sodium: 140 mmol/L (ref 135–145)

## 2016-10-22 LAB — CBC
HEMATOCRIT: 22.8 % — AB (ref 40.0–52.0)
Hemoglobin: 7.6 g/dL — ABNORMAL LOW (ref 13.0–18.0)
MCH: 27.5 pg (ref 26.0–34.0)
MCHC: 33.1 g/dL (ref 32.0–36.0)
MCV: 82.9 fL (ref 80.0–100.0)
Platelets: 115 10*3/uL — ABNORMAL LOW (ref 150–440)
RBC: 2.76 MIL/uL — ABNORMAL LOW (ref 4.40–5.90)
RDW: 15.9 % — AB (ref 11.5–14.5)
WBC: 6.9 10*3/uL (ref 3.8–10.6)

## 2016-10-22 LAB — PROTIME-INR
INR: 1.32
Prothrombin Time: 16.5 seconds — ABNORMAL HIGH (ref 11.4–15.2)

## 2016-10-22 LAB — GLUCOSE, CAPILLARY
GLUCOSE-CAPILLARY: 125 mg/dL — AB (ref 65–99)
Glucose-Capillary: 126 mg/dL — ABNORMAL HIGH (ref 65–99)
Glucose-Capillary: 102 mg/dL — ABNORMAL HIGH (ref 65–99)
Glucose-Capillary: 149 mg/dL — ABNORMAL HIGH (ref 65–99)
Glucose-Capillary: 167 mg/dL — ABNORMAL HIGH (ref 65–99)

## 2016-10-22 LAB — PREPARE RBC (CROSSMATCH)

## 2016-10-22 SURGERY — VISCERAL ARTERY INTERVENTION
Anesthesia: Moderate Sedation

## 2016-10-22 MED ORDER — POLYVINYL ALCOHOL 1.4 % OP SOLN
1.0000 [drp] | OPHTHALMIC | Status: DC | PRN
  Administered 2016-10-22: 08:00:00 1 [drp] via OPHTHALMIC
  Filled 2016-10-22: qty 15

## 2016-10-22 MED ORDER — SODIUM CHLORIDE 0.9 % IV SOLN
INTRAVENOUS | Status: DC | PRN
  Administered 2016-10-22: 250 mL via INTRAVENOUS

## 2016-10-22 MED ORDER — MIDAZOLAM HCL 5 MG/5ML IJ SOLN
INTRAMUSCULAR | Status: AC
  Filled 2016-10-22: qty 5

## 2016-10-22 MED ORDER — MIDAZOLAM HCL 2 MG/2ML IJ SOLN
INTRAMUSCULAR | Status: DC | PRN
  Administered 2016-10-22 (×3): 1 mg via INTRAVENOUS

## 2016-10-22 MED ORDER — HEPARIN (PORCINE) IN NACL 2-0.9 UNIT/ML-% IJ SOLN
INTRAMUSCULAR | Status: AC
  Filled 2016-10-22: qty 1000

## 2016-10-22 MED ORDER — IOPAMIDOL (ISOVUE-300) INJECTION 61%
INTRAVENOUS | Status: DC | PRN
  Administered 2016-10-22: 175 mL via INTRAVENOUS

## 2016-10-22 MED ORDER — FENTANYL CITRATE (PF) 100 MCG/2ML IJ SOLN
INTRAMUSCULAR | Status: DC | PRN
  Administered 2016-10-22 (×3): 25 ug via INTRAVENOUS

## 2016-10-22 MED ORDER — SODIUM CHLORIDE 0.9 % IV SOLN
Freq: Once | INTRAVENOUS | Status: AC
  Administered 2016-10-22: 10:00:00 via INTRAVENOUS

## 2016-10-22 MED ORDER — FENTANYL CITRATE (PF) 100 MCG/2ML IJ SOLN
INTRAMUSCULAR | Status: AC
  Filled 2016-10-22: qty 2

## 2016-10-22 MED ORDER — HEPARIN SODIUM (PORCINE) 1000 UNIT/ML IJ SOLN
INTRAMUSCULAR | Status: AC
  Filled 2016-10-22: qty 1

## 2016-10-22 MED ORDER — SODIUM CHLORIDE 0.9 % IV SOLN: INTRAVENOUS | Status: DC

## 2016-10-22 MED ORDER — CEFAZOLIN IN D5W 1 GM/50ML IV SOLN
1.0000 g | Freq: Once | INTRAVENOUS | Status: AC
  Administered 2016-10-22: 1 g via INTRAVENOUS

## 2016-10-22 MED ORDER — LIDOCAINE-EPINEPHRINE (PF) 2 %-1:200000 IJ SOLN
INTRAMUSCULAR | Status: AC
  Filled 2016-10-22: qty 20

## 2016-10-22 MED ORDER — CEFAZOLIN IN D5W 1 GM/50ML IV SOLN
INTRAVENOUS | Status: AC
  Filled 2016-10-22: qty 50

## 2016-10-22 SURGICAL SUPPLY — 18 items
BLOCK BEAD 300-500 MIC (Vascular Products) ×3 IMPLANT
CATH 5FR PIG 90CM (CATHETERS) ×3 IMPLANT
CATH 5FR REUT (CATHETERS) ×6 IMPLANT
CATH IMAGER II S 5FR 65CM (MISCELLANEOUS) ×3 IMPLANT
CATH MICROCATH PRGRT 2.8F 110 (CATHETERS) ×1 IMPLANT
CATH RIM 65CM (CATHETERS) ×3 IMPLANT
COVER PROBE U/S 5X48 (MISCELLANEOUS) ×3 IMPLANT
DEVICE PRESTO INFLATION (MISCELLANEOUS) ×3 IMPLANT
DEVICE STARCLOSE SE CLOSURE (Vascular Products) ×3 IMPLANT
DEVICE TORQUE (MISCELLANEOUS) ×3 IMPLANT
GLIDECATH 4FR STR (CATHETERS) ×3 IMPLANT
GLIDEWIRE STIFF .35X180X3 HYDR (WIRE) ×3 IMPLANT
MICROCATH PROGREAT 2.8F 110 CM (CATHETERS) ×3
PACK ANGIOGRAPHY (CUSTOM PROCEDURE TRAY) ×3 IMPLANT
SHEATH BRITE TIP 5FRX11 (SHEATH) ×3 IMPLANT
SYR MEDRAD MARK V 150ML (SYRINGE) ×3 IMPLANT
TUBING CONTRAST HIGH PRESS 72 (TUBING) ×3 IMPLANT
WIRE J 3MM .035X145CM (WIRE) ×3 IMPLANT

## 2016-10-22 NOTE — Op Note (Signed)
[name] Percutaneous Study/Intervention Procedural Note     Surgeon(s): [employer]  Assistants: none  Pre-operative Diagnosis: 1. Lower GI bleed   Post-operative diagnosis: Same  Procedure(s) Performed: 1. Ultrasound guidance for vascular access right femoral artery 2. Catheter placement into SMA and secondary IMA branches from right femoral approach 3. Aortogram and selective angiogram of the SMA and IMA 4. Microbead embolization of sigmoidal and left colonic IMA branches with 2 cc of 300-500  polyvinyl alcohol beads. 5. StarClose closure device right femoral artery  Anesthesia: Moderate conscious sedation for approximately 80 minutes using 3 mg of Versed and 75 mcg of Fentanyl  EBL: 25 cc  Fluoro Time: 23.1 minutes  Contrast: 175 cc  Indications: Patient is a 83 y.o.male with brisk lower GI bleeding with resultant anemia. The patient has a nuclear medicine study showing bleeding from the distal and sigmoid colon. The patient is brought in for angiography for further evaluation and potential treatment. Risks and benefits are discussed and informed consent is obtained  Procedure: The patient was identified and appropriate procedural time out was performed. The patient was then placed supine on the table and prepped and draped in the usual sterile fashion.Moderate conscious sedation was administered during a face to face encounter with the patient throughout the procedure with my supervision of the RN administering medicines and monitoring the patient's vital signs, pulse oximetry, telemetry and mental status throughout from the start of the procedure until the patient was taken to the recovery room.  Ultrasound was used to evaluate the right common femoral artery. It was patent . A digital ultrasound image was acquired. A Seldinger needle was used to access  the right common femoral artery under direct ultrasound guidance and a permanent image was performed. A 0.035 J wire was advanced without resistance and a 5Fr sheath was placed. Pigtail catheter was placed into the aorta and an AP aortogram was performed. This demonstrated the expected aneurysm with no obvious origin of the IMA seen. The renal arteries were patent. I attempted to cannulate the IMA for quite some time without success and an RAO and AP projection. We transitioned to the lateral projection to cannulate the SMA. A VS 1 catheter was used to selectively cannulate the SMA.  This demonstrated normal SMA flow but no obvious large branch feeding down to the left colon or sigmoidal colon that would be able to be advanced to treat the area of bleeding. I then went back and used his previous CT scan of the lumbar spine to try to evaluate where the origin of the IMA might be. Using this as an anatomic landmark I was able to blindly cannulate the inferior mesenteric artery which on selective imaging was found to be small and diseased and possibly occluded proximally. I was able to advance the pro-great microcatheter beyond this proximal disease to the distal main IMA where a large left colonic branch and a pair of sigmoidal branches were identified and found to be patent. I initially went down into the primary sigmoidal branch that fed both of the other 2 sigmoidal branches and deployed 1.5 cc of 300-500  polyvinyl alcohol beads. I then pulled the pro-great microcatheter back and cannulated the left colonic branch and instilled another 0.5 cc of 300-500  polyvinyl alcohol beads. With the catheter back in the main IMA, completion angiogram showed the main vessels to be open with less brisk filling and sluggish flow into the bowel through the distal vessels. I felt we  had treated this area as best possible from an endovascular standpoint . I elected to terminate the procedure. The diagnostic catheter was  removed. StarClose closure device was deployed in usual fashion with excellent hemostatic result. The patient was taken to the recovery room in stable condition having tolerated the procedure well.     Findings: none  Disposition: Patient was taken to the recovery room in stable condition having tolerated the procedure well.  Complications: None  Blake [name] 10/22/2016 1:45 PM   This note was created with Dragon Medical transcription system. Any errors in dictation are purely unintentional.

## 2016-10-22 NOTE — Progress Notes (Signed)
[place] at [facility]                                                                                                                                                                                  Patient Demographics   Blake [name], is a 83 y.o. male, DOB - [birth year], UXY:[id number]  Admit date - 10/20/2016   Admitting Physician Harvie Bridge, DO  Outpatient Primary MD for the patient is [place] III, MD   LOS - 2  Subjective:  pt continues to have brbpr    Review of Systems:   CONSTITUTIONAL: No documented fever. No fatigue, weakness. No weight gain, no weight loss.  EYES: No blurry or double vision.  ENT: No tinnitus. No postnasal drip. No redness of the oropharynx.  RESPIRATORY: No cough, no wheeze, no hemoptysis. No dyspnea.  CARDIOVASCULAR: No chest pain. No orthopnea. No palpitations. No syncope.  GASTROINTESTINAL: No nausea, no vomiting or diarrhea. No abdominal pain. No melena  positive.Bright red blood per rectum GENITOURINARY: No dysuria or hematuria.  ENDOCRINE: No polyuria or nocturia. No heat or cold intolerance.  HEMATOLOGY: No anemia. No bruising. No bleeding.  INTEGUMENTARY: No rashes. No lesions.  MUSCULOSKELETAL: No arthritis. No swelling. No gout.  NEUROLOGIC: No numbness, tingling, or ataxia. No seizure-type activity.  PSYCHIATRIC: No anxiety. No insomnia. No ADD.    Vitals:   Vitals:   10/22/16 0827 10/22/16 1041 10/22/16 1115 10/22/16 1132  BP:  (!) 109/59 100/64 135/81  Pulse:  99 92 86  Resp:  18 18 (!) 22  Temp:  98.3 F (36.8 C) 98.2 F (36.8 C) 98.1 F (36.7 C)  TempSrc:  Oral Oral Oral  SpO2: 97% 98% 100% 97%  Weight:    221 lb (100.2 kg)  Height:    6\' 2"  (1.88 m)    Wt Readings from Last 3 Encounters:  10/22/16 221 lb (100.2 kg)  10/09/16 218 lb 0.6 oz (98.9 kg)  04/27/16 218 lb (98.9 kg)     Intake/Output Summary (Last 24 hours) at 10/22/16 1238 Last data filed at 10/22/16 1660  Gross  per 24 hour  Intake          1662.67 ml  Output             1220 ml  Net           442.67 ml    Physical Exam:   GENERAL: Pleasant-appearing in no apparent distress.  HEAD, EYES, EARS, NOSE AND THROAT: Atraumatic, normocephalic. Extraocular muscles are intact. Pupils equal and reactive to light. Sclerae anicteric. No conjunctival injection. No oro-pharyngeal erythema.  NECK: Supple. There is no jugular venous  distention. No bruits, no lymphadenopathy, no thyromegaly.  HEART: Regular rate and rhythm,. No murmurs, no rubs, no clicks.  LUNGS: Clear to auscultation bilaterally. No rales or rhonchi. No wheezes.  ABDOMEN: Soft, flat, nontender, nondistended. Has good bowel sounds. No hepatosplenomegaly appreciated.  EXTREMITIES: No evidence of any cyanosis, clubbing, or peripheral edema.  +2 pedal and radial pulses bilaterally.  NEUROLOGIC: The patient is alert, awake, and oriented x3 with no focal motor or sensory deficits appreciated bilaterally.  SKIN: Moist and warm with no rashes appreciated.  Psych: Not anxious, depressed LN: No inguinal LN enlargement    Antibiotics   Anti-infectives    Start     Dose/Rate Route Frequency Ordered Stop   10/22/16 1200  ceFAZolin (ANCEF) IVPB 1 g/50 mL premix     1 g 100 mL/hr over 30 Minutes Intravenous  Once 10/22/16 1155        Medications   Scheduled Meds: . [MAR Hold] azelastine  1 spray Each Nare BID  .  ceFAZolin (ANCEF) IV  1 g Intravenous Once  . [MAR Hold] famotidine  20 mg Oral Daily  . [MAR Hold] finasteride  5 mg Oral Daily  . [MAR Hold] insulin aspart  0-15 Units Subcutaneous Q4H  . [MAR Hold] metoprolol tartrate  12.5 mg Oral BID  . [MAR Hold] pantoprazole (PROTONIX) IV  40 mg Intravenous Q12H  . [MAR Hold] rosuvastatin  5 mg Oral QHS  . [MAR Hold] sodium chloride flush  3 mL Intravenous Q12H  . [MAR Hold] tamsulosin  0.4 mg Oral QHS  . [MAR Hold] triamcinolone cream  1 application Topical BID   Continuous Infusions: .  sodium chloride 75 mL/hr at 10/22/16 1143  . sodium chloride     PRN Meds:.[MAR Hold] acetaminophen **OR** [MAR Hold] acetaminophen, [MAR Hold] albuterol, fentaNYL, [MAR Hold] ipratropium, midazolam, [MAR Hold] ondansetron **OR** [MAR Hold] ondansetron (ZOFRAN) IV, [MAR Hold] oxyCODONE, [MAR Hold] polyvinyl alcohol, [MAR Hold] traMADol   Data Review:   Micro Results No results found for this or any previous visit (from the past 240 hour(s)).  Radiology Reports Dg Chest 2 View  Result Date: 10/09/2016 CLINICAL DATA:  Acute onset of syncope and vomiting. Cough. Initial encounter. EXAM: CHEST  2 VIEW COMPARISON:  Chest radiograph performed 04/14/2015 FINDINGS: The lungs are well-aerated. Right basilar airspace opacity raises concern for pneumonia. No pleural effusion or pneumothorax is seen. The heart is borderline normal in size. A pacemaker is noted at the left chest wall, with leads ending at the right atrium and right ventricle. No acute osseous abnormalities are seen. IMPRESSION: Right basilar pneumonia noted. Electronically Signed   By: Garald Balding M.D.   On: 10/09/2016 18:36   Nm Gi Blood Loss  Result Date: 10/21/2016 CLINICAL DATA:  83 year old male with a history of bright red blood per rectum EXAM: NUCLEAR MEDICINE GASTROINTESTINAL BLEEDING SCAN TECHNIQUE: Sequential abdominal images were obtained following intravenous administration of Tc-34m labeled red blood cells. RADIOPHARMACEUTICALS:  21.5 mCi Tc-16m in-vitro labeled red cells. COMPARISON:  None. FINDINGS: Anterior planar imaging was acquired over the course of 2 hours after administration of radiotracer IV. First our imaging demonstrates adequate appearance of the blood pool, with first pass through liver and heart. Early during the first hour, there is accumulation of radiotracer in the central pelvis at the 6-10 minute interval. This does not have the configuration of urinary bladder, and does persist through the second hour of  imaging. IMPRESSION: Nuclear medicine GI bleeding study suggestive of bleeding in  the distal sigmoid colon or rectum. These results were called by telephone at the time of interpretation on 10/21/2016 at 4:10 pm to the nurse caring for the patient, Ms Tamala Fothergill who verbally acknowledged these results. Electronically Signed   By: Corrie Mckusick D.O.   On: 10/21/2016 16:13   US Venous Img Lower Bilateral  Result Date: 10/09/2016 CLINICAL DATA:  Leg swelling.  Hypoxia. EXAM: BILATERAL LOWER EXTREMITY VENOUS DOPPLER ULTRASOUND TECHNIQUE: Gray-scale sonography with graded compression, as well as color Doppler and duplex ultrasound were performed to evaluate the lower extremity deep venous systems from the level of the common femoral vein and including the common femoral, femoral, profunda femoral, popliteal and calf veins including the posterior tibial, peroneal and gastrocnemius veins when visible. The superficial great saphenous vein was also interrogated. Spectral Doppler was utilized to evaluate flow at rest and with distal augmentation maneuvers in the common femoral, femoral and popliteal veins. COMPARISON:  None. FINDINGS: RIGHT LOWER EXTREMITY Common Femoral Vein: No evidence of thrombus. Normal compressibility, respiratory phasicity and response to augmentation. Saphenofemoral Junction: No evidence of thrombus. Normal compressibility and flow on color Doppler imaging. Profunda Femoral Vein: No evidence of thrombus. Normal compressibility and flow on color Doppler imaging. Femoral Vein: No evidence of thrombus. Normal compressibility, respiratory phasicity and response to augmentation. Popliteal Vein: No evidence of thrombus. Normal compressibility, respiratory phasicity and response to augmentation. Calf Veins: No evidence of thrombus. Normal compressibility and flow on color Doppler imaging. Superficial Great Saphenous Vein: No evidence of thrombus. Normal compressibility and flow on color Doppler imaging.  Venous Reflux:  None. Other Findings:  None. LEFT LOWER EXTREMITY Common Femoral Vein: No evidence of thrombus. Normal compressibility, respiratory phasicity and response to augmentation. Saphenofemoral Junction: No evidence of thrombus. Normal compressibility and flow on color Doppler imaging. Profunda Femoral Vein: No evidence of thrombus. Normal compressibility and flow on color Doppler imaging. Femoral Vein: No evidence of thrombus. Normal compressibility, respiratory phasicity and response to augmentation. Popliteal Vein: No evidence of thrombus. Normal compressibility, respiratory phasicity and response to augmentation. Calf Veins: No evidence of thrombus. Normal compressibility and flow on color Doppler imaging. Superficial Great Saphenous Vein: No evidence of thrombus. Normal compressibility and flow on color Doppler imaging. Venous Reflux:  None. Other Findings:  None. IMPRESSION: No evidence of deep venous thrombosis. Electronically Signed   By: Lajean Manes M.D.   On: 10/09/2016 18:19   Dg Chest Portable 1 View  Result Date: 10/09/2016 CLINICAL DATA:  Dyspnea EXAM: PORTABLE CHEST 1 VIEW COMPARISON:  Chest radiograph from earlier today. FINDINGS: Stable configuration of 2 lead left subclavian pacemaker. Stable cardiomediastinal silhouette with normal heart size and aortic atherosclerosis. No pneumothorax. No pleural effusion. Significant worsening of patchy consolidation at the right lung base. IMPRESSION: Significant worsening of patchy consolidation at the right lung base since this afternoon, most compatible with blooming pneumonia (particularly if the patient received IV hydration). Recommend follow-up chest radiographs to resolution. Electronically Signed   By: Ilona Sorrel M.D.   On: 10/09/2016 21:29     CBC  Recent Labs Lab 10/20/16 2200 10/21/16 0202 10/21/16 0905 10/21/16 1639 10/21/16 2144 10/22/16 0513  WBC 9.2 7.2 7.6  --   --  6.9  HGB 9.5* 7.1* 7.0* 7.1* 7.7* 7.6*  HCT  29.2* 21.6* 21.4*  --   --  22.8*  PLT 173 127* 118*  --   --  115*  MCV 82.1 82.5 82.8  --   --  82.9  MCH 26.8  27.2 27.0  --   --  27.5  MCHC 32.6 32.9 32.7  --   --  33.1  RDW 16.2* 16.0* 16.3*  --   --  15.9*    Chemistries   Recent Labs Lab 10/20/16 2200 10/21/16 0905 10/22/16 0513  NA 134* 138 140  K 3.7 3.7 3.9  CL 102 109 111  CO2 25 24 25   GLUCOSE 216* 117* 126*  BUN 30* 31* 26*  CREATININE 1.98* 1.93* 1.70*  CALCIUM 8.4* 7.9* 7.9*   ------------------------------------------------------------------------------------------------------------------ estimated creatinine clearance is 35.8 mL/min (A) (by C-G formula based on SCr of 1.7 mg/dL (H)). ------------------------------------------------------------------------------------------------------------------ No results for input(s): HGBA1C in the last 72 hours. ------------------------------------------------------------------------------------------------------------------ No results for input(s): CHOL, HDL, LDLCALC, TRIG, CHOLHDL, LDLDIRECT in the last 72 hours. ------------------------------------------------------------------------------------------------------------------ No results for input(s): TSH, T4TOTAL, T3FREE, THYROIDAB in the last 72 hours.  Invalid input(s): FREET3 ------------------------------------------------------------------------------------------------------------------ No results for input(s): VITAMINB12, FOLATE, FERRITIN, TIBC, IRON, RETICCTPCT in the last 72 hours.  Coagulation profile  Recent Labs Lab 10/21/16 0202 10/21/16 0905 10/21/16 1639 10/21/16 2144 10/22/16 0513  INR 1.28 1.33 1.35 1.34 1.32    No results for input(s): DDIMER in the last 72 hours.  Cardiac Enzymes No results for input(s): CKMB, TROPONINI, MYOGLOBIN in the last 168 hours.  Invalid input(s):  CK ------------------------------------------------------------------------------------------------------------------ Invalid input(s): [place]   This is a 83 y.o. male with a history of  A. fib on Eliquis, sick sinus syndrome status post pacemaker 10/16, AAA, diabetes, hyperlipidemia, hypertension, prostate cancer  now being admitted with:  #. Lower GI bleedWith acute on chronic blood loss anemia in a patient on Eliquis- Suspect due to diverticular bleed Patient undergoing vascular embolization to stop help the bleeding We will transfuse him 1 more packed blood cells  #. History of hypertension -Continue Lopressor, Norvasc  #. History of diabetes -Accu-Cheks every 4 hours with regular insulin sliding scale coverage -Hold oral hypoglycemics -Blood sugar stable  #. History of prostate/bladder cancer -Continue finasteride, tamsulosin  #. History of hyperlipidemia -Continue Crestor  #. History of GERD     Code Status Orders        Start     Ordered   10/21/16 0155  Full code  Continuous     10/21/16 0154    Code Status History    Date Active Date Inactive Code Status Order ID Comments User Context   10/09/2016 11:42 PM 10/13/2016  8:09 PM Full Code [id number]  Vaughan Basta, MD Inpatient   04/14/2015  2:32 PM 04/15/2015  5:39 PM Full Code [id number]  Isaias Cowman, MD Inpatient   04/09/2015 12:22 AM 04/14/2015  2:32 PM Full Code [id number]  Lytle Butte, MD ED    Advance Directive Documentation     Most Recent Value  Type of Advance Directive  Living will, Healthcare Power of Attorney  Pre-existing out of facility DNR order (yellow form or pink MOST form)  -  "MOST" Form in Place?  -           Consults  Gi, vascular surgery  DVT Prophylaxis scd's  Lab Results  Component Value Date   PLT 115 (L) 10/22/2016     Time Spent in minutes   66min  Greater than 50% of time spent in care coordination and counseling patient  regarding the condition and plan of care.   Dustin Flock M.D on 10/22/2016 at 12:38 PM  Between 7am to 6pm - Pager - [phone]  After  6pm go to [email or web address] - password [email or web address] [facility]   Office  [phone]

## 2016-10-22 NOTE — H&P (Signed)
[facility] History & Physical Update  The patient was interviewed and re-examined.  The patient's previous History and Physical has been reviewed and is unchanged.  There is no change in the plan of care. We plan to proceed with the scheduled procedure.  Leotis Pain, MD  10/22/2016, 11:33 AM

## 2016-10-22 NOTE — Progress Notes (Signed)
[place] for Kcentra Indication: Rectal bleeding  Allergies  Allergen Reactions  . Diovan [Valsartan] Cough  . Hydralazine Itching  . Lisinopril Cough    Patient Measurements: Height: 6\' 2"  (188 cm) Weight: 221 lb (100.2 kg) IBW/kg (Calculated) : 82.2 Adjusted Body Weight:   Vital Signs: Temp: 97.5 F (36.4 C) (04/13 0758) Temp Source: Oral (04/13 0758) BP: 118/68 (04/13 0758) Pulse Rate: 76 (04/13 0758) Intake/Output from previous day: 04/12 0701 - 04/13 0700 In: 2414.7 [I.V.:1652.7; Blood:762] Out: 700 [Urine:700] Intake/Output from this shift: Total I/O In: -  Out: 520 [Urine:520]  Labs:  Recent Labs  10/20/16 2200 10/21/16 0202 10/21/16 0905 10/21/16 1639 10/21/16 2144 10/22/16 0513  WBC 9.2 7.2 7.6  --   --  6.9  HGB 9.5* 7.1* 7.0* 7.1* 7.7* 7.6*  HCT 29.2* 21.6* 21.4*  --   --  22.8*  PLT 173 127* 118*  --   --  115*  APTT 47* 43* 35 42* 41*  --   CREATININE 1.98*  --  1.93*  --   --  1.70*    Estimated Creatinine Clearance: 35.8 mL/min (A) (by C-G formula based on SCr of 1.7 mg/dL (H)).   Medical History: Past Medical History:  Diagnosis Date  . AAA (abdominal aortic aneurysm) ([place])   . Arrhythmia   . Diabetes mellitus without complication ([place])   . GI bleed   . Heart murmur   . Hyperlipidemia   . Hypertension   . Prostate cancer ([place])     Medications:  Scheduled:  . amLODipine  5 mg Oral Daily  . azelastine  1 spray Each Nare BID  . famotidine  20 mg Oral Daily  . finasteride  5 mg Oral Daily  . insulin aspart  0-15 Units Subcutaneous Q4H  . metoprolol tartrate  12.5 mg Oral BID  . pantoprazole (PROTONIX) IV  40 mg Intravenous Q12H  . rosuvastatin  5 mg Oral QHS  . sodium chloride flush  3 mL Intravenous Q12H  . tamsulosin  0.4 mg Oral QHS  . triamcinolone cream  1 application Topical BID    Assessment: Patient is in apixaban 5 mg bid at home. Baseline PT/INR WNL, aPTT 47, HL  2.38.  Goal of Therapy:  Reversal of rectal bleeding and normalization of coagulation studies  Plan:  Will administer Kcentra 50 units/kg (5000 units). Will check repeat HL, CBC q6h x 4, then daily x 2. 4/12 INR levels= 1.33, 1.35, 1.34  4/13: INR= 1.32. Per post Kcentra/Feiba/Praxbind pharmacy monitoring Protocol: Follow up INR/aPTT only required if Patient was on Warfarin or Dabigatran.  This patient was on apixaban.   Thank you for this consult.  Chinita Greenland PharmD Clinical Pharmacist 10/22/2016

## 2016-10-22 NOTE — Progress Notes (Signed)
[facility]  Daily Progress Note   Subjective  - 1 Day Post-Op  Did well overnight.  Seemed to have stopped bleeding but after I saw him this am the nurses have reported a reasonably large blood BM.  He is having no pain  Objective Vitals:   10/22/16 0401 10/22/16 0758 10/22/16 0827 10/22/16 1041  BP: 111/65 118/68  (!) 109/59  Pulse: 83 76  99  Resp: 20 16  18   Temp: 98.5 F (36.9 C) 97.5 F (36.4 C)  98.3 F (36.8 C)  TempSrc: Oral Oral  Oral  SpO2: 98% 95% 97% 98%  Weight:      Height:        Intake/Output Summary (Last 24 hours) at 10/22/16 1049 Last data filed at 10/22/16 7096  Gross per 24 hour  Intake          1962.67 ml  Output             1220 ml  Net           742.67 ml    PULM  CTAB CV  irregular VASC  Abdomen soft  Laboratory CBC    Component Value Date/Time   WBC 6.9 10/22/2016 0513   HGB 7.6 (L) 10/22/2016 0513   HGB 11.2 (L) 06/16/2014 1337   HCT 22.8 (L) 10/22/2016 0513   HCT 34.8 (L) 06/16/2014 1337   PLT 115 (L) 10/22/2016 0513   PLT 150 06/16/2014 1337    BMET    Component Value Date/Time   NA 140 10/22/2016 0513   NA 137 06/16/2014 1337   K 3.9 10/22/2016 0513   K 4.3 06/16/2014 1337   CL 111 10/22/2016 0513   CL 100 06/16/2014 1337   CO2 25 10/22/2016 0513   CO2 27 06/16/2014 1337   GLUCOSE 126 (H) 10/22/2016 0513   GLUCOSE 215 (H) 06/16/2014 1337   BUN 26 (H) 10/22/2016 0513   BUN 29 (H) 06/16/2014 1337   CREATININE 1.70 (H) 10/22/2016 0513   CREATININE 1.48 (H) 06/16/2014 1337   CALCIUM 7.9 (L) 10/22/2016 0513   CALCIUM 8.4 (L) 06/16/2014 1337   GFRNONAA 33 (L) 10/22/2016 0513   GFRNONAA 48 (L) 06/16/2014 1337   GFRNONAA 48 (L) 12/29/2012 1722   GFRAA 39 (L) 10/22/2016 0513   GFRAA 58 (L) 06/16/2014 1337   GFRAA 56 (L) 12/29/2012 1722    Assessment/Planning:    GI bleed.  Had slowed or stopped, but now bleeding again.  With positive bleeding scan would plan mesenteric angiogram with  possible embolization at this time    Leotis Pain  10/22/2016, 10:49 AM

## 2016-10-22 NOTE — Evaluation (Signed)
Physical Therapy Evaluation Patient Details Name: Blake [name] MRN: [id number] DOB: [birth year] Today's Date: 10/22/2016   History of Present Illness   83 y.o. male with a known history of A. fib on Eliquis, sick sinus syndrome status post pacemaker 10/16, AAA, diabetes, hyperlipidemia, hypertension, prostate cancer was in a usual state of health until he was noted to have two episodes of rectal bleeding and was admitted with GI bleed.  Pt's Hgb was ~7 on arrival, he has had tranfusions with some increased HH.  Clinical Impression  Pt showed good effort and willingness to participate, however ambulation was cut short secondary to pt have bloody drainage while walking to the bathroom and did have significant blood in the commode with BM.  Pt was able to do relatively well with bed mobility and standing/walking, but is weak (HGB still low at 7.6 post 2 units).  Pt wanting to go home and should be able to but needs to get a little stronger and address the GI bleed.  Will try to do more with PT once this improves.     Follow Up Recommendations Home health PT;Supervision/Assistance - 24 hour;Supervision for mobility/OOB    Equipment Recommendations  None recommended by PT    Recommendations for Other Services       Precautions / Restrictions Precautions Precautions: Fall Restrictions Weight Bearing Restrictions: No      Mobility  Bed Mobility Overal bed mobility: Needs Assistance Bed Mobility: Supine to Sit     Supine to sit: Min guard     General bed mobility comments: uses rails and increased time  Transfers Overall transfer level: Needs assistance Equipment used: Rolling walker (2 wheeled) Transfers: Sit to/from Stand Sit to Stand: Min assist         General transfer comment: Pt intially struggled to initiate getting weight forward/lift himself.  He did need cuing for hand placement and sequencing as well as light assist to ultimately achieve  standing.  Ambulation/Gait Ambulation/Gait assistance: Min guard Ambulation Distance (Feet): 35 Feet Assistive device: Rolling walker (2 wheeled)       General Gait Details: Pt was able to ambulate with heavy reliance on the walker but did not have any LOBs or overt unsteadiness.  He did feel as though he needed to use the bathroom and while getting there did have some bloody leakage while getting to the commode.  Nursing notified.  Stairs            Wheelchair Mobility    Modified Rankin (Stroke Patients Only)       Balance Overall balance assessment: Needs assistance Sitting-balance support: No upper extremity supported;Feet supported Sitting balance-Leahy Scale: Good     Standing balance support: Bilateral upper extremity supported;During functional activity Standing balance-Leahy Scale: Fair                               Pertinent Vitals/Pain Pain Assessment: No/denies pain    Home Living Family/patient expects to be discharged to:: Private residence Living Arrangements: Spouse/significant other Available Help at Discharge: Family Type of Home: House Home Access: Stairs to enter Entrance Stairs-Rails: Can reach both;Left;Right Entrance Stairs-Number of Steps: 3 Home Layout: One level Home Equipment: Carver - 4 wheels;Cane - single point;Wheelchair - manual      Prior Function Level of Independence: Needs assistance   Gait / Transfers Assistance Needed: Household distances with AD, very slow step-to gait at baseline.   ADL's / Homemaking  Assistance Needed: Indep in basic ADL; support from family for IADL, meals, appts etc.   Comments: Pt reports that he is only really out of the house for doctor's appointments,     Hand Dominance        Extremity/Trunk Assessment   Upper Extremity Assessment Upper Extremity Assessment: Overall WFL for tasks assessed (age appropriate limitations)    Lower Extremity Assessment Lower Extremity  Assessment: Generalized weakness (age appropriate limitations, R knee lacking full ext (5-10*))       Communication   Communication: No difficulties  Cognition Arousal/Alertness: Awake/alert Behavior During Therapy: WFL for tasks assessed/performed Overall Cognitive Status: Within Functional Limits for tasks assessed                                        General Comments      Exercises     Assessment/Plan    PT Assessment Patient needs continued PT services  PT Problem List Decreased strength;Decreased activity tolerance;Decreased balance;Decreased mobility;Decreased knowledge of precautions;Decreased knowledge of use of DME       PT Treatment Interventions Gait training;DME instruction;Stair training;Functional mobility training;Therapeutic activities;Therapeutic exercise;Balance training;Patient/family education    PT Goals (Current goals can be found in the Care Plan section)  Acute Rehab PT Goals Patient Stated Goal: get stronger PT Goal Formulation: With patient/family Time For Goal Achievement: 11/05/16 Potential to Achieve Goals: Fair    Frequency Min 2X/week   Barriers to discharge        Co-evaluation               End of Session Equipment Utilized During Treatment: Gait belt Activity Tolerance: Patient tolerated treatment well Patient left: in chair;with nursing/sitter in room;with call bell/phone within reach Nurse Communication: Mobility status;Other (comment) (bleeding per rectum) PT Visit Diagnosis: Unsteadiness on feet (R26.81);Muscle weakness (generalized) (M62.81);Difficulty in walking, not elsewhere classified (R26.2)    Time: 3354-5625 PT Time Calculation (min) (ACUTE ONLY): 16 min   Charges:   PT Evaluation $PT Eval Low Complexity: 1 Procedure     PT G Codes:        Kreg Shropshire, DPT 10/22/2016, 11:00 AM

## 2016-10-23 LAB — BASIC METABOLIC PANEL
ANION GAP: 6 (ref 5–15)
BUN: 19 mg/dL (ref 6–20)
CALCIUM: 8 mg/dL — AB (ref 8.9–10.3)
CO2: 23 mmol/L (ref 22–32)
Chloride: 111 mmol/L (ref 101–111)
Creatinine, Ser: 1.36 mg/dL — ABNORMAL HIGH (ref 0.61–1.24)
GFR, EST AFRICAN AMERICAN: 51 mL/min — AB (ref >60)
GFR, EST NON AFRICAN AMERICAN: 44 mL/min — AB (ref >60)
GLUCOSE: 118 mg/dL — AB (ref 65–99)
POTASSIUM: 3.6 mmol/L (ref 3.5–5.1)
Sodium: 140 mmol/L (ref 135–145)

## 2016-10-23 LAB — GLUCOSE, CAPILLARY
GLUCOSE-CAPILLARY: 129 mg/dL — AB (ref 65–99)
GLUCOSE-CAPILLARY: 90 mg/dL (ref 65–99)
GLUCOSE-CAPILLARY: 158 mg/dL — AB (ref 65–99)
GLUCOSE-CAPILLARY: 105 mg/dL — AB (ref 65–99)
Glucose-Capillary: 124 mg/dL — ABNORMAL HIGH (ref 65–99)
Glucose-Capillary: 158 mg/dL — ABNORMAL HIGH (ref 65–99)
Glucose-Capillary: 172 mg/dL — ABNORMAL HIGH (ref 65–99)

## 2016-10-23 LAB — CBC
HEMATOCRIT: 25.5 % — AB (ref 40.0–52.0)
Hemoglobin: 8.5 g/dL — ABNORMAL LOW (ref 13.0–18.0)
MCH: 28.4 pg (ref 26.0–34.0)
MCHC: 33.4 g/dL (ref 32.0–36.0)
MCV: 85 fL (ref 80.0–100.0)
PLATELETS: 124 10*3/uL — AB (ref 150–440)
RBC: 3 MIL/uL — AB (ref 4.40–5.90)
RDW: 15.8 % — ABNORMAL HIGH (ref 11.5–14.5)
WBC: 15 10*3/uL — AB (ref 3.8–10.6)

## 2016-10-23 NOTE — Progress Notes (Addendum)
Patient ID: [name], male   DOB: September 03, 1922, 83 y.o.   MRN: [id number]  [facility] PROGRESS NOTE  KEIGO WHALLEY YHC:[id number] DOB: 04/25/1923 DOA: 10/20/2016 PCP: Adin Hector, MD  HPI/Subjective: Patient feeling okay. As per the nurse a few mucousy bloody bowel movements, very scant amount. Patient offers no complaints.  Objective: Vitals:   10/23/16 1002 10/23/16 1323  BP: 129/67 122/74  Pulse: 88 83  Resp: 16 18  Temp: 98.5 F (36.9 C) 98.7 F (37.1 C)    Filed Weights   10/21/16 0136 10/21/16 1054 10/22/16 1132  Weight: 100.2 kg (221 lb) 100.2 kg (221 lb) 100.2 kg (221 lb)    ROS: Review of Systems  Constitutional: Negative for chills and fever.  Eyes: Negative for blurred vision.  Respiratory: Negative for cough and shortness of breath.   Cardiovascular: Negative for chest pain.  Gastrointestinal: Negative for abdominal pain, constipation, diarrhea, nausea and vomiting.  Genitourinary: Negative for dysuria.  Musculoskeletal: Negative for joint pain.  Neurological: Negative for dizziness and headaches.   Exam: Physical Exam  Constitutional: He is oriented to person, place, and time.  HENT:  Nose: No mucosal edema.  Mouth/Throat: No oropharyngeal exudate or posterior oropharyngeal edema.  Eyes: Conjunctivae, EOM and lids are normal. Pupils are equal, round, and reactive to light.  Neck: No JVD present. Carotid bruit is not present. No edema present. No thyroid mass and no thyromegaly present.  Cardiovascular: S1 normal and S2 normal.  Exam reveals no gallop.   No murmur heard. Pulses:      Dorsalis pedis pulses are 2+ on the right side, and 2+ on the left side.  Respiratory: No respiratory distress. He has no wheezes. He has no rhonchi. He has no rales.  GI: Soft. Bowel sounds are normal. There is no tenderness.  Musculoskeletal:       Right ankle: He exhibits swelling.       Left ankle: He exhibits swelling.  Lymphadenopathy:    He has no  cervical adenopathy.  Neurological: He is alert and oriented to person, place, and time. No cranial nerve deficit.  Skin: Skin is warm. No rash noted. Nails show no clubbing.  Psychiatric: He has a normal mood and affect.      Data Reviewed: Basic Metabolic Panel:  Recent Labs Lab 10/20/16 2200 10/21/16 0905 10/22/16 0513 10/23/16 0551  NA 134* 138 140 140  K 3.7 3.7 3.9 3.6  CL 102 109 111 111  CO2 25 24 25 23   GLUCOSE 216* 117* 126* 118*  BUN 30* 31* 26* 19  CREATININE 1.98* 1.93* 1.70* 1.36*  CALCIUM 8.4* 7.9* 7.9* 8.0*   CBC:  Recent Labs Lab 10/20/16 2200 10/21/16 0202 10/21/16 0905 10/21/16 1639 10/21/16 2144 10/22/16 0513 10/22/16 1519 10/23/16 0551  WBC 9.2 7.2 7.6  --   --  6.9  --  15.0*  HGB 9.5* 7.1* 7.0* 7.1* 7.7* 7.6* 8.5* 8.5*  HCT 29.2* 21.6* 21.4*  --   --  22.8*  --  25.5*  MCV 82.1 82.5 82.8  --   --  82.9  --  85.0  PLT 173 127* 118*  --   --  115*  --  124*    CBG:  Recent Labs Lab 10/22/16 2043 10/23/16 0028 10/23/16 0505 10/23/16 0722 10/23/16 1114  GLUCAP 167* 172* 129* 90 158*     Studies: Dg Chest 2 View  Result Date: 10/22/2016 CLINICAL DATA:  Possible aspiration. EXAM: CHEST  2  VIEW COMPARISON:  10/09/2016 chest radiograph FINDINGS: Stable cardiac silhouette given projection and technique. Aortic atherosclerosis with calcification. Resolution of right lung base consolidation. No new focal consolidation identified. Stable linear opacity in the right costal diaphragmatic angle probably represents scarring. No pleural effusion or pneumothorax. Bones are unremarkable. Two lead pacemaker. IMPRESSION: Interval resolution of right lung base consolidation. No new acute pulmonary process identified. Electronically Signed   By: Kristine Garbe M.D.   On: 10/22/2016 21:49   Nm Gi Blood Loss  Result Date: 10/21/2016 CLINICAL DATA:  83 year old male with a history of bright red blood per rectum EXAM: NUCLEAR MEDICINE  GASTROINTESTINAL BLEEDING SCAN TECHNIQUE: Sequential abdominal images were obtained following intravenous administration of Tc-88m labeled red blood cells. RADIOPHARMACEUTICALS:  21.5 mCi Tc-59m in-vitro labeled red cells. COMPARISON:  None. FINDINGS: Anterior planar imaging was acquired over the course of 2 hours after administration of radiotracer IV. First our imaging demonstrates adequate appearance of the blood pool, with first pass through liver and heart. Early during the first hour, there is accumulation of radiotracer in the central pelvis at the 6-10 minute interval. This does not have the configuration of urinary bladder, and does persist through the second hour of imaging. IMPRESSION: Nuclear medicine GI bleeding study suggestive of bleeding in the distal sigmoid colon or rectum. These results were called by telephone at the time of interpretation on 10/21/2016 at 4:10 pm to the nurse caring for the patient, Ms Tamala Fothergill who verbally acknowledged these results. Electronically Signed   By: Corrie Mckusick D.O.   On: 10/21/2016 16:13    Scheduled Meds: . azelastine  1 spray Each Nare BID  . famotidine  20 mg Oral Daily  . finasteride  5 mg Oral Daily  . insulin aspart  0-15 Units Subcutaneous Q4H  . metoprolol tartrate  12.5 mg Oral BID  . pantoprazole (PROTONIX) IV  40 mg Intravenous Q12H  . rosuvastatin  5 mg Oral QHS  . sodium chloride flush  3 mL Intravenous Q12H  . tamsulosin  0.4 mg Oral QHS  . triamcinolone cream  1 application Topical BID    Assessment/Plan:  1. Acute hemorrhagic anemia with lower GI bleed. The patient was on Eliquis as outpatient which is now on hold. Status post embolization yesterday by Dr. Lucky Cowboy. Continue to monitor hemoglobin and follow bowel movements. Hemoglobin held overnight. Recheck tomorrow. Looks like the Patient has been transfused a total of 4 units of packed red blood cells. Advance diet. 2. Acute kidney injury. Improved. Discontinue IV fluid  hydration 3. Essential hypertension on metoprolol 4. Type 2 diabetes mellitus on sliding scale 5. History of prostate cancer and BPH on finasteride and Flomax 6. Hyperlipidemia unspecified on Crestor 7. History of GERD  Code Status:     Code Status Orders        Start     Ordered   10/21/16 0155  Full code  Continuous     10/21/16 0154    Code Status History    Date Active Date Inactive Code Status Order ID Comments User Context   10/09/2016 11:42 PM 10/13/2016  8:09 PM Full Code [id number]  Vaughan Basta, MD Inpatient   04/14/2015  2:32 PM 04/15/2015  5:39 PM Full Code [id number]  Isaias Cowman, MD Inpatient   04/09/2015 12:22 AM 04/14/2015  2:32 PM Full Code [id number]  Lytle Butte, MD ED    Advance Directive Documentation     Most Recent Value  Type of Advance Directive  Living  will, Healthcare Power of Attorney  Pre-existing out of facility DNR order (yellow form or pink MOST form)  -  "MOST" Form in Place?  -     Family Communication: Spoke with Dr. Primus Bravo on the phone Disposition Plan: TBD  Consultants:  Vascular surgery  Gastroenterology  Time spent: 28 minutes  [place], [facility]

## 2016-10-23 NOTE — Progress Notes (Signed)
[facility]  Daily Progress Note   Subjective: 1 Day Post-Op: Ultrasound guidance for vascular access right femoral artery, catheter placement into SMA and secondary IMA branches from right femoral approach, Aortogram and selective angiogram of the SMA and IMA, Microbead embolization of sigmoidal and left colonic IMA branches with 2 cc of 300-500  polyvinyl alcohol beads with StarClose closure device right femoral artery.  Patient seen with family member and nursing in room. Bowel movement last night with some blood with wiping, but no gross BRBPR. Patient is without complaint. H&H is stable, just over a liter of urine yesterday, vitals stable. Patient denies any pain.   Objective: Vitals:   10/22/16 1847 10/22/16 2041 10/23/16 0459 10/23/16 1002  BP: 96/65 120/66 131/66 129/67  Pulse: 83 96 (!) 139 88  Resp: 16  20 16   Temp:  97.5 F (36.4 C) 98.2 F (36.8 C) 98.5 F (36.9 C)  TempSrc:  Oral Oral Oral  SpO2: 96% 100% 96% 100%  Weight:      Height:        Intake/Output Summary (Last 24 hours) at 10/23/16 1317 Last data filed at 10/23/16 1037  Gross per 24 hour  Intake          3198.75 ml  Output              775 ml  Net          2423.75 ml   Physical Exam: A&Ox3, NAD CV: Irregularly, Irregular Pulmonary: CTA Bilaterally Abdomen: Soft, Non-tender, Non-distended Right Groin: Dressing removed and new one placed as old one was falling off. No swelling, no drainage noted.  Vascular: Lower extremity warm and non-tender   Laboratory: CBC    Component Value Date/Time   WBC 15.0 (H) 10/23/2016 0551   HGB 8.5 (L) 10/23/2016 0551   HGB 11.2 (L) 06/16/2014 1337   HCT 25.5 (L) 10/23/2016 0551   HCT 34.8 (L) 06/16/2014 1337   PLT 124 (L) 10/23/2016 0551   PLT 150 06/16/2014 1337   BMET    Component Value Date/Time   NA 140 10/23/2016 0551   NA 137 06/16/2014 1337   K 3.6 10/23/2016 0551   K 4.3 06/16/2014 1337   CL 111 10/23/2016 0551   CL 100  06/16/2014 1337   CO2 23 10/23/2016 0551   CO2 27 06/16/2014 1337   GLUCOSE 118 (H) 10/23/2016 0551   GLUCOSE 215 (H) 06/16/2014 1337   BUN 19 10/23/2016 0551   BUN 29 (H) 06/16/2014 1337   CREATININE 1.36 (H) 10/23/2016 0551   CREATININE 1.48 (H) 06/16/2014 1337   CALCIUM 8.0 (L) 10/23/2016 0551   CALCIUM 8.4 (L) 06/16/2014 1337   GFRNONAA 44 (L) 10/23/2016 0551   GFRNONAA 48 (L) 06/16/2014 1337   GFRNONAA 48 (L) 12/29/2012 1722   GFRAA 51 (L) 10/23/2016 0551   GFRAA 58 (L) 06/16/2014 1337   GFRAA 56 (L) 12/29/2012 1722   Assessment/Planning: 83 year old male with bleeding diverticulum s/p embolization POD#1 - Stable 1) No additional bowel movement with gross RBPR, vitals stable, making urine, H&H stable 2) If bleeding to re-occur would possibly re-angio or general surgery intervention at that time. 3) Patient to follow up in our office as outpatient.   Marcelle Overlie PA-C 10/23/2016 1:17 PM

## 2016-10-24 LAB — TYPE AND SCREEN
ABO/RH(D): B POS
ANTIBODY SCREEN: NEGATIVE
UNIT DIVISION: 0
UNIT DIVISION: 0
Unit division: 0
Unit division: 0

## 2016-10-24 LAB — BPAM RBC
BLOOD PRODUCT EXPIRATION DATE: 201805032359
BLOOD PRODUCT EXPIRATION DATE: 201805162359
Blood Product Expiration Date: 201805082359
Blood Product Expiration Date: 201805082359
ISSUE DATE / TIME: 201804121646
ISSUE DATE / TIME: 201804131044
ISSUE DATE / TIME: 201804120425
ISSUE DATE / TIME: 201804121744
Unit Type and Rh: 1700
Unit Type and Rh: 7300
Unit Type and Rh: 7300
Unit Type and Rh: 7300

## 2016-10-24 LAB — GLUCOSE, CAPILLARY
GLUCOSE-CAPILLARY: 219 mg/dL — AB (ref 65–99)
GLUCOSE-CAPILLARY: 92 mg/dL (ref 65–99)
Glucose-Capillary: 117 mg/dL — ABNORMAL HIGH (ref 65–99)
Glucose-Capillary: 202 mg/dL — ABNORMAL HIGH (ref 65–99)

## 2016-10-24 LAB — BASIC METABOLIC PANEL
ANION GAP: 7 (ref 5–15)
BUN: 14 mg/dL (ref 6–20)
CALCIUM: 7.6 mg/dL — AB (ref 8.9–10.3)
CO2: 23 mmol/L (ref 22–32)
CREATININE: 1.64 mg/dL — AB (ref 0.61–1.24)
Chloride: 108 mmol/L (ref 101–111)
GFR calc Af Amer: 41 mL/min — ABNORMAL LOW (ref >60)
GFR, EST NON AFRICAN AMERICAN: 35 mL/min — AB (ref >60)
GLUCOSE: 123 mg/dL — AB (ref 65–99)
Potassium: 3.5 mmol/L (ref 3.5–5.1)
Sodium: 138 mmol/L (ref 135–145)

## 2016-10-24 LAB — HEMOGLOBIN: HEMOGLOBIN: 7.2 g/dL — AB (ref 13.0–18.0)

## 2016-10-24 MED ORDER — POTASSIUM CHLORIDE CRYS ER 20 MEQ PO TBCR
40.0000 meq | EXTENDED_RELEASE_TABLET | Freq: Once | ORAL | Status: AC
  Administered 2016-10-24: 40 meq via ORAL
  Filled 2016-10-24: qty 2

## 2016-10-24 MED ORDER — INSULIN ASPART 100 UNIT/ML ~~LOC~~ SOLN
0.0000 [IU] | Freq: Three times a day (TID) | SUBCUTANEOUS | Status: DC
  Administered 2016-10-24 (×2): 5 [IU] via SUBCUTANEOUS
  Administered 2016-10-25: 3 [IU] via SUBCUTANEOUS
  Administered 2016-10-25: 09:00:00 2 [IU] via SUBCUTANEOUS
  Filled 2016-10-24: qty 3
  Filled 2016-10-24 (×2): qty 5
  Filled 2016-10-24: qty 2

## 2016-10-24 MED ORDER — SODIUM CHLORIDE 0.9 % IV SOLN
400.0000 mg | Freq: Once | INTRAVENOUS | Status: AC
  Administered 2016-10-24: 14:00:00 400 mg via INTRAVENOUS
  Filled 2016-10-24: qty 20

## 2016-10-24 MED ORDER — FUROSEMIDE 10 MG/ML IJ SOLN
40.0000 mg | Freq: Once | INTRAMUSCULAR | Status: AC
  Administered 2016-10-24: 40 mg via INTRAVENOUS
  Filled 2016-10-24: qty 4

## 2016-10-24 NOTE — Progress Notes (Signed)
[facility]  Daily Progress Note   Subjective: 2 Days Post-Op: Ultrasound guidance for vascular access rightfemoral artery, catheter placement into SMA and secondary IMA branches from right femoral approach, Aortogram and selective angiogram of the SMA and IMA, Microbead embolization of sigmoidal and left colonic IMA brancheswith 2 cc of 300-500 polyvinyl alcohol beads with StarClose closure device rightfemoral artery  Seen with family in room. Patient is without complaint. Spoke with nurse still having some bloody bowel movements however she does not describe then as "gross amounts of RBPR". Good urine output yesterday, vitals stable, 1 gram drop in Hbg.   Objective: Vitals:   10/23/16 1323 10/23/16 2013 10/24/16 0423 10/24/16 0911  BP: 122/74 137/72 (!) 110/56 (!) 115/51  Pulse: 83 89 80 81  Resp: 18 20 20 18   Temp: 98.7 F (37.1 C) 98.9 F (37.2 C) 99.3 F (37.4 C) 98.2 F (36.8 C)  TempSrc: Oral Oral Oral Oral  SpO2: 93% 100% 96% 95%  Weight:      Height:        Intake/Output Summary (Last 24 hours) at 10/24/16 1439 Last data filed at 10/24/16 1345  Gross per 24 hour  Intake              360 ml  Output              645 ml  Net             -285 ml   Physical Exam: A&Ox3, NAD CV: RRR Pulmonary: CTA Bilaterally Abdomen: Soft, Nontender, Nondistended Groin: Right access site, OR dressing changes yesterday. No swelling or drainage noted.  Vascular: bilateral lower extremity warm, non-tender   Laboratory: CBC    Component Value Date/Time   WBC 15.0 (H) 10/23/2016 0551   HGB 7.2 (L) 10/24/2016 0519   HGB 11.2 (L) 06/16/2014 1337   HCT 25.5 (L) 10/23/2016 0551   HCT 34.8 (L) 06/16/2014 1337   PLT 124 (L) 10/23/2016 0551   PLT 150 06/16/2014 1337   BMET    Component Value Date/Time   NA 138 10/24/2016 0519   NA 137 06/16/2014 1337   K 3.5 10/24/2016 0519   K 4.3 06/16/2014 1337   CL 108 10/24/2016 0519   CL 100 06/16/2014 1337   CO2  23 10/24/2016 0519   CO2 27 06/16/2014 1337   GLUCOSE 123 (H) 10/24/2016 0519   GLUCOSE 215 (H) 06/16/2014 1337   BUN 14 10/24/2016 0519   BUN 29 (H) 06/16/2014 1337   CREATININE 1.64 (H) 10/24/2016 0519   CREATININE 1.48 (H) 06/16/2014 1337   CALCIUM 7.6 (L) 10/24/2016 0519   CALCIUM 8.4 (L) 06/16/2014 1337   GFRNONAA 35 (L) 10/24/2016 0519   GFRNONAA 48 (L) 06/16/2014 1337   GFRNONAA 48 (L) 12/29/2012 1722   GFRAA 41 (L) 10/24/2016 0519   GFRAA 58 (L) 06/16/2014 1337   GFRAA 56 (L) 12/29/2012 1722   Assessment/Planning: 83 year old male with bleeding diverticulum s/p embolization POD#2 - Stable 1) One gram drop in Hbg - patient does not seen to be symptomatic O/U good, vitals stable - may be equalization. Would order CBC in AM to follow.  2) Slight bump in creatinine - will continue to follow with BMP in AM. 3) If bleeding increases repeat embolization is not indicated due to possible colonic ischemia (discussed with Dr. Lucky Cowboy), would need surgical intervention at that point.   Discussed with Dr. Ellis Parents Tonica Brasington PA-C 10/24/2016 2:39 PM

## 2016-10-24 NOTE — Progress Notes (Signed)
Patient ID: Blake [name], male   DOB: [birth year], 83 y.o.   MRN: [id number]  [facility] PROGRESS NOTE  Blake [name] VHQ:[id number] DOB: September 13, 1922 DOA: 10/20/2016 PCP: Adin Hector, MD  HPI/Subjective: Patient feels okay and offers no complaints. No further bleeding documented by nursing staff. Patient does have some pain in his right groin if he coughs but otherwise has no pain.  Objective: Vitals:   10/24/16 0423 10/24/16 0911  BP: (!) 110/56 (!) 115/51  Pulse: 80 81  Resp: 20 18  Temp: 99.3 F (37.4 C) 98.2 F (36.8 C)    Filed Weights   10/21/16 0136 10/21/16 1054 10/22/16 1132  Weight: 100.2 kg (221 lb) 100.2 kg (221 lb) 100.2 kg (221 lb)    ROS: Review of Systems  Constitutional: Negative for chills and fever.  Eyes: Negative for blurred vision.  Respiratory: Negative for cough and shortness of breath.   Cardiovascular: Negative for chest pain.  Gastrointestinal: Negative for abdominal pain, constipation, diarrhea, nausea and vomiting.  Genitourinary: Negative for dysuria.  Musculoskeletal: Negative for joint pain.  Neurological: Negative for dizziness and headaches.   Exam: Physical Exam  Constitutional: He is oriented to person, place, and time.  HENT:  Nose: No mucosal edema.  Mouth/Throat: No oropharyngeal exudate or posterior oropharyngeal edema.  Eyes: Conjunctivae, EOM and lids are normal. Pupils are equal, round, and reactive to light.  Neck: No JVD present. Carotid bruit is not present. No edema present. No thyroid mass and no thyromegaly present.  Cardiovascular: S1 normal and S2 normal.  Exam reveals no gallop.   No murmur heard. Pulses:      Dorsalis pedis pulses are 2+ on the right side, and 2+ on the left side.  Respiratory: No respiratory distress. He has no wheezes. He has no rhonchi. He has rales in the right lower field and the left lower field.  GI: Soft. Bowel sounds are normal. There is no tenderness.  Musculoskeletal:   Right ankle: He exhibits swelling.       Left ankle: He exhibits swelling.  Lymphadenopathy:    He has no cervical adenopathy.  Neurological: He is alert and oriented to person, place, and time. No cranial nerve deficit.  Skin: Skin is warm. No rash noted. Nails show no clubbing.  Psychiatric: He has a normal mood and affect.      Data Reviewed: Basic Metabolic Panel:  Recent Labs Lab 10/20/16 2200 10/21/16 0905 10/22/16 0513 10/23/16 0551 10/24/16 0519  NA 134* 138 140 140 138  K 3.7 3.7 3.9 3.6 3.5  CL 102 109 111 111 108  CO2 25 24 25 23 23   GLUCOSE 216* 117* 126* 118* 123*  BUN 30* 31* 26* 19 14  CREATININE 1.98* 1.93* 1.70* 1.36* 1.64*  CALCIUM 8.4* 7.9* 7.9* 8.0* 7.6*   CBC:  Recent Labs Lab 10/20/16 2200 10/21/16 0202 10/21/16 0905  10/21/16 2144 10/22/16 0513 10/22/16 1519 10/23/16 0551 10/24/16 0519  WBC 9.2 7.2 7.6  --   --  6.9  --  15.0*  --   HGB 9.5* 7.1* 7.0*  < > 7.7* 7.6* 8.5* 8.5* 7.2*  HCT 29.2* 21.6* 21.4*  --   --  22.8*  --  25.5*  --   MCV 82.1 82.5 82.8  --   --  82.9  --  85.0  --   PLT 173 127* 118*  --   --  115*  --  124*  --   < > =  values in this interval not displayed.  CBG:  Recent Labs Lab 10/23/16 1625 10/23/16 1941 10/23/16 2326 10/24/16 0740 10/24/16 1143  GLUCAP 124* 158* 105* 117* 219*     Studies: Dg Chest 2 View  Result Date: 10/22/2016 CLINICAL DATA:  Possible aspiration. EXAM: CHEST  2 VIEW COMPARISON:  10/09/2016 chest radiograph FINDINGS: Stable cardiac silhouette given projection and technique. Aortic atherosclerosis with calcification. Resolution of right lung base consolidation. No new focal consolidation identified. Stable linear opacity in the right costal diaphragmatic angle probably represents scarring. No pleural effusion or pneumothorax. Bones are unremarkable. Two lead pacemaker. IMPRESSION: Interval resolution of right lung base consolidation. No new acute pulmonary process identified.  Electronically Signed   By: Kristine Garbe M.D.   On: 10/22/2016 21:49    Scheduled Meds: . azelastine  1 spray Each Nare BID  . famotidine  20 mg Oral Daily  . finasteride  5 mg Oral Daily  . insulin aspart  0-15 Units Subcutaneous TID AC & HS  . iron sucrose  400 mg Intravenous Once  . metoprolol tartrate  12.5 mg Oral BID  . pantoprazole (PROTONIX) IV  40 mg Intravenous Q12H  . rosuvastatin  5 mg Oral QHS  . sodium chloride flush  3 mL Intravenous Q12H  . tamsulosin  0.4 mg Oral QHS  . triamcinolone cream  1 application Topical BID    Assessment/Plan:  1. Acute hemorrhagic anemia with lower GI bleed. The patient was on Eliquis as outpatient which is now Discontinued. Status post embolization Friday by Dr. Lucky Cowboy. Continue to monitor hemoglobin and follow bowel movements. Hemoglobin dipped down overnight. Recheck tomorrow. Looks like the Patient has been transfused a total of 4 units of packed red blood cells. Advanced diet. Recheck hemoglobin tomorrow morning. Potential disposition in the next day or so depending on clinical course. IV Venofer 400 mg IV 1 given 2. Acute kidney injury on chronic kidney disease stage III. IV fluids discontinued yesterday. With some noises in the lungs today I will give 1 dose of IV Lasix. 3. Essential hypertension on metoprolol 4. Type 2 diabetes mellitus on sliding scale 5. History of prostate cancer and BPH on finasteride and Flomax 6. Hyperlipidemia unspecified on Crestor 7. History of GERD  Code Status:     Code Status Orders        Start     Ordered   10/21/16 0155  Full code  Continuous     10/21/16 0154    Code Status History    Date Active Date Inactive Code Status Order ID Comments User Context   10/09/2016 11:42 PM 10/13/2016  8:09 PM Full Code [id number]  Vaughan Basta, MD Inpatient   04/14/2015  2:32 PM 04/15/2015  5:39 PM Full Code [id number]  Isaias Cowman, MD Inpatient   04/09/2015 12:22 AM 04/14/2015  2:32 PM  Full Code [id number]  Lytle Butte, MD ED    Advance Directive Documentation     Most Recent Value  Type of Advance Directive  Living will, Healthcare Power of Attorney  Pre-existing out of facility DNR order (yellow form or pink MOST form)  -  "MOST" Form in Place?  -     Family Communication: Spoke with Dr. Primus Bravo on the phone And given update this morning Disposition Plan: TBD  Consultants:  Vascular surgery  Gastroenterology  Time spent: 25 minutes  [place], [facility]

## 2016-10-25 ENCOUNTER — Encounter: Payer: Self-pay | Admitting: Vascular Surgery

## 2016-10-25 LAB — BASIC METABOLIC PANEL
ANION GAP: 7 (ref 5–15)
BUN: 15 mg/dL (ref 6–20)
CALCIUM: 7.7 mg/dL — AB (ref 8.9–10.3)
CHLORIDE: 107 mmol/L (ref 101–111)
CO2: 24 mmol/L (ref 22–32)
Creatinine, Ser: 1.81 mg/dL — ABNORMAL HIGH (ref 0.61–1.24)
GFR calc non Af Amer: 31 mL/min — ABNORMAL LOW (ref >60)
GFR, EST AFRICAN AMERICAN: 36 mL/min — AB (ref >60)
GLUCOSE: 129 mg/dL — AB (ref 65–99)
Potassium: 3.4 mmol/L — ABNORMAL LOW (ref 3.5–5.1)
Sodium: 138 mmol/L (ref 135–145)

## 2016-10-25 LAB — HEMOGLOBIN: Hemoglobin: 7.4 g/dL — ABNORMAL LOW (ref 13.0–18.0)

## 2016-10-25 LAB — GLUCOSE, CAPILLARY
GLUCOSE-CAPILLARY: 129 mg/dL — AB (ref 65–99)
Glucose-Capillary: 172 mg/dL — ABNORMAL HIGH (ref 65–99)

## 2016-10-25 LAB — MAGNESIUM: Magnesium: 1.7 mg/dL (ref 1.7–2.4)

## 2016-10-25 LAB — PREPARE RBC (CROSSMATCH)

## 2016-10-25 MED ORDER — SODIUM CHLORIDE 0.9 % IV SOLN
Freq: Once | INTRAVENOUS | Status: AC
  Administered 2016-10-25: 10:00:00 via INTRAVENOUS

## 2016-10-25 MED ORDER — POTASSIUM CHLORIDE CRYS ER 20 MEQ PO TBCR
40.0000 meq | EXTENDED_RELEASE_TABLET | Freq: Once | ORAL | Status: AC
  Administered 2016-10-25: 10:00:00 40 meq via ORAL
  Filled 2016-10-25: qty 2

## 2016-10-25 NOTE — Care Management Important Message (Signed)
Important Message  Patient Details  Name: Blake [name] MRN: [id number] Date of Birth: 02-08-1923   Medicare Important Message Given:  Yes    Shelbie Ammons, RN 10/25/2016, 9:43 AM

## 2016-10-25 NOTE — Progress Notes (Signed)
[facility]  Daily Progress Note   Subjective  - 3 Days Post-Op  Doing well.  No more bloody BM documented by nursing and patient denies any more blood being passed.  No abdominal pain.  Eating breakfast. Hgb 7.4 today, stable from 7.2 yesterday.  Objective Vitals:   10/24/16 0423 10/24/16 0911 10/24/16 2121 10/25/16 0515  BP: (!) 110/56 (!) 115/51 (!) 114/55 (!) 114/56  Pulse: 80 81 86 80  Resp: 20 18 20 19   Temp: 99.3 F (37.4 C) 98.2 F (36.8 C) 98.3 F (36.8 C) 98.5 F (36.9 C)  TempSrc: Oral Oral    SpO2: 96% 95% 97% 94%  Weight:      Height:        Intake/Output Summary (Last 24 hours) at 10/25/16 0828 Last data filed at 10/25/16 0727  Gross per 24 hour  Intake              750 ml  Output              525 ml  Net              225 ml    PULM  CTAB CV  RRR VASC  Access site C/D/I ABD    Soft, ND/NT  Laboratory CBC    Component Value Date/Time   WBC 15.0 (H) 10/23/2016 0551   HGB 7.4 (L) 10/25/2016 0431   HGB 11.2 (L) 06/16/2014 1337   HCT 25.5 (L) 10/23/2016 0551   HCT 34.8 (L) 06/16/2014 1337   PLT 124 (L) 10/23/2016 0551   PLT 150 06/16/2014 1337    BMET    Component Value Date/Time   NA 138 10/25/2016 0431   NA 137 06/16/2014 1337   K 3.4 (L) 10/25/2016 0431   K 4.3 06/16/2014 1337   CL 107 10/25/2016 0431   CL 100 06/16/2014 1337   CO2 24 10/25/2016 0431   CO2 27 06/16/2014 1337   GLUCOSE 129 (H) 10/25/2016 0431   GLUCOSE 215 (H) 06/16/2014 1337   BUN 15 10/25/2016 0431   BUN 29 (H) 06/16/2014 1337   CREATININE 1.81 (H) 10/25/2016 0431   CREATININE 1.48 (H) 06/16/2014 1337   CALCIUM 7.7 (L) 10/25/2016 0431   CALCIUM 8.4 (L) 06/16/2014 1337   GFRNONAA 31 (L) 10/25/2016 0431   GFRNONAA 48 (L) 06/16/2014 1337   GFRNONAA 48 (L) 12/29/2012 1722   GFRAA 36 (L) 10/25/2016 0431   GFRAA 58 (L) 06/16/2014 1337   GFRAA 56 (L) 12/29/2012 1722    Assessment/Planning: POD #3 s/p mesenteric embolization   Seems to be  doing well  No further bleeding appears to be present.  Hgb 7.4.  With his age and comorbidities, may benefit from a unit of blood  Regular diet.  Likely stable for discharge from vascular POV when felt safe by primary team.    Leotis Pain  10/25/2016, 8:28 AM

## 2016-10-25 NOTE — Care Management (Signed)
Discharge this afternoon per Dr. Manuella Ghazi. Will receive a unit of blood prior to discharge. Resumption of care with [place]. Family will transport. Shelbie Ammons RN MSN CCM Care Management [phone]

## 2016-10-26 LAB — BPAM RBC
BLOOD PRODUCT EXPIRATION DATE: 201805172359
ISSUE DATE / TIME: 201804161140
UNIT TYPE AND RH: 7300

## 2016-10-26 LAB — TYPE AND SCREEN
ABO/RH(D): B POS
Antibody Screen: NEGATIVE
UNIT DIVISION: 0

## 2016-10-28 NOTE — Discharge Summary (Signed)
[facility] NAME: Trini Christiansen    MR#:  [id number]  DATE OF BIRTH:  03-15-1923  DATE OF ADMISSION:  10/20/2016   ADMITTING PHYSICIAN: Harvie Bridge, DO  DATE OF DISCHARGE: 10/25/2016  6:00 PM  PRIMARY CARE PHYSICIAN: [name], MD   ADMISSION DIAGNOSIS:  Lower GI bleed [K92.2] DISCHARGE DIAGNOSIS:  Active Problems:   Lower GI bleed   Blood in stool   Hemorrhage of rectum and anus  SECONDARY DIAGNOSIS:   Past Medical History:  Diagnosis Date  . AAA (abdominal aortic aneurysm) ([place])   . Arrhythmia   . Diabetes mellitus without complication ([place])   . GI bleed   . Heart murmur   . Hyperlipidemia   . Hypertension   . Prostate cancer [facility])    HOSPITAL COURSE:  83 y.o. male with a known history of A. fib on Eliquis, sick sinus syndrome status post pacemaker 10/16, AAA, diabetes, hyperlipidemia, hypertension, prostate cancer was in a usual state of health until he was noted to have two episodes of rectal bleeding on the day of admission. Patient's daughter states he while he was having a bowel movement when they noticed massive amounts of bright red blood mixed in with stool. He had a second bowel movement with dark red clots. He has not had any additional bleeding since then.   Of note patient was previously on aspirin and Plavix however last week his anticoagulant regimen changed to Eliquis only. He did see Dr. Clayborn Bigness for follow-up. Patient was also hospitalized from 3/31-10/13/16 For acute respiratory failure secondary to aspiration pneumonia, AKI on CKD, near syncope  1. Acute hemorrhagic anemia with lower GI bleed. The patient was on Eliquis as outpatient which is now Discontinued. Status post embolization Friday by Dr. Lucky Cowboy. s/p transfusion of a total of 5 units of packed red blood cells. Tolerated diet. IV Venofer 400 mg IV 1 given. Remained hemodynamically stable 2. Acute kidney injury on chronic kidney disease  stage III. IV fluids discontinued yesterday. With some noises in the lungs today I will give 1 dose of IV Lasix. 3. Essential hypertension on metoprolol 4. Type 2 diabetes mellitus on sliding scale 5. History of prostate cancer and BPH on finasteride and Flomax 6. Hyperlipidemia unspecified on Crestor 7. History of GERD  DISCHARGE CONDITIONS:  stable CONSULTS OBTAINED:  Treatment Team:  [name], MD Algernon Huxley, MD DRUG ALLERGIES:   Allergies  Allergen Reactions  . Diovan [Valsartan] Cough  . Hydralazine Itching  . Lisinopril Cough   DISCHARGE MEDICATIONS:   Allergies as of 10/25/2016      Reactions   Diovan [valsartan] Cough   Hydralazine Itching   Lisinopril Cough      Medication List    STOP taking these medications   amoxicillin-clavulanate 875-125 MG tablet Commonly known as:  AUGMENTIN   ELIQUIS 5 MG Tabs tablet Generic drug:  apixaban     TAKE these medications   amLODipine 5 MG tablet Commonly known as:  NORVASC Take 5 mg by mouth daily.   azelastine 0.1 % nasal spray Commonly known as:  ASTELIN Place 1 spray into both nostrils 2 (two) times daily. Use in each nostril as directed   finasteride 5 MG tablet Commonly known as:  PROSCAR Take 5 mg by mouth daily.   glimepiride 2 MG tablet Commonly known as:  AMARYL 2 tablets in the morning and one tablet at night What changed:  how much  to take  additional instructions   metoprolol tartrate 25 MG tablet Commonly known as:  LOPRESSOR Take 0.5 tablets (12.5 mg total) by mouth 2 (two) times daily.   PRESERVISION/LUTEIN PO Take 1 capsule by mouth 2 (two) times daily.   ranitidine 150 MG tablet Commonly known as:  ZANTAC Take 150 mg by mouth 2 (two) times daily.   rosuvastatin 5 MG tablet Commonly known as:  CRESTOR Take 5 mg by mouth at bedtime.   sitaGLIPtin 50 MG tablet Commonly known as:  JANUVIA Take 50 mg by mouth daily.   tamsulosin 0.4 MG Caps capsule Commonly known as:   FLOMAX Take 0.4 mg by mouth at bedtime.   traMADol 50 MG tablet Commonly known as:  ULTRAM Take by mouth every 6 (six) hours as needed.   triamcinolone cream 0.5 % Commonly known as:  KENALOG Apply 1 application topically 2 (two) times daily.        DISCHARGE INSTRUCTIONS:   DIET:  Regular diet DISCHARGE CONDITION:  Good ACTIVITY:  Activity as tolerated OXYGEN:  Home Oxygen: No.  Oxygen Delivery: room air DISCHARGE LOCATION:  home   If you experience worsening of your admission symptoms, develop shortness of breath, life threatening emergency, suicidal or homicidal thoughts you must seek medical attention immediately by calling 911 or calling your MD immediately  if symptoms less severe.  You Must read complete instructions/literature along with all the possible adverse reactions/side effects for all the Medicines you take and that have been prescribed to you. Take any new Medicines after you have completely understood and accpet all the possible adverse reactions/side effects.   Please note  You were cared for by a hospitalist during your hospital stay. If you have any questions about your discharge medications or the care you received while you were in the hospital after you are discharged, you can call the unit and asked to speak with the hospitalist on call if the hospitalist that took care of you is not available. Once you are discharged, your primary care physician will handle any further medical issues. Please note that NO REFILLS for any discharge medications will be authorized once you are discharged, as it is imperative that you return to your primary care physician (or establish a relationship with a primary care physician if you do not have one) for your aftercare needs so that they can reassess your need for medications and monitor your lab values.    On the day of Discharge:  VITAL SIGNS:  Blood pressure 115/64, pulse 68, temperature 98.1 F (36.7 C),  temperature source Oral, resp. rate 18, height 6\' 2"  (1.88 m), weight 100.2 kg (221 lb), SpO2 99 %. PHYSICAL EXAMINATION:  GENERAL:  83 y.o.-year-old patient lying in the bed with no acute distress.  EYES: Pupils equal, round, reactive to light and accommodation. No scleral icterus. Extraocular muscles intact.  HEENT: Head atraumatic, normocephalic. Oropharynx and nasopharynx clear.  NECK:  Supple, no jugular venous distention. No thyroid enlargement, no tenderness.  LUNGS: Normal breath sounds bilaterally, no wheezing, rales,rhonchi or crepitation. No use of accessory muscles of respiration.  CARDIOVASCULAR: S1, S2 normal. No murmurs, rubs, or gallops.  ABDOMEN: Soft, non-tender, non-distended. Bowel sounds present. No organomegaly or mass.  EXTREMITIES: No pedal edema, cyanosis, or clubbing.  NEUROLOGIC: Cranial nerves II through XII are intact. Muscle strength 5/5 in all extremities. Sensation intact. Gait not checked.  PSYCHIATRIC: The patient is alert and oriented x 3.  SKIN: No obvious rash, lesion, or ulcer.  DATA REVIEW:   CBC  Recent Labs Lab 10/23/16 0551  10/25/16 0431  WBC 15.0*  --   --   HGB 8.5*  < > 7.4*  HCT 25.5*  --   --   PLT 124*  --   --   < > = values in this interval not displayed.  Chemistries   Recent Labs Lab 10/25/16 0431  NA 138  K 3.4*  CL 107  CO2 24  GLUCOSE 129*  BUN 15  CREATININE 1.81*  CALCIUM 7.7*  MG 1.7    Follow-up Information    Leotis Pain, MD Follow up on 11/09/2016.   Specialties:  Vascular Surgery, Radiology, Interventional Cardiology Why:  at 1:30 p.m.  Patient is s/p embolization by Dr. Lucky Cowboy for bleeding diverticulum. Contact information: [place] [address] [phone]        BERT J KLEIN III, MD. Go on 11/05/2016.   Specialty:  Internal Medicine Why:  at 4:00 p.m. Contact information: [place] [address] [phone]           Management plans  discussed with the patient, family and they are in agreement.  CODE STATUS: Prior   TOTAL TIME TAKING CARE OF THIS PATIENT: 45 minutes.    Max Sane M.D on 10/28/2016 at 12:07 PM  Between 7am to 6pm - Pager - [phone]  After 6pm go to [email or web address] - Proofreader  [facility]  Office  [phone]  CC: Primary care physician; Adin Hector, MD   Note: This dictation was prepared with Dragon dictation along with smaller phrase technology. Any transcriptional errors that result from this process are unintentional.

## 2016-11-09 ENCOUNTER — Encounter (INDEPENDENT_AMBULATORY_CARE_PROVIDER_SITE_OTHER): Payer: Self-pay | Admitting: Vascular Surgery

## 2016-11-09 ENCOUNTER — Ambulatory Visit (INDEPENDENT_AMBULATORY_CARE_PROVIDER_SITE_OTHER): Payer: Medicare Other | Admitting: Vascular Surgery

## 2016-11-09 VITALS — BP 131/69 | HR 72 | Resp 16 | Wt 215.0 lb

## 2016-11-09 DIAGNOSIS — K922 Gastrointestinal hemorrhage, unspecified: Secondary | ICD-10-CM

## 2016-11-09 DIAGNOSIS — E785 Hyperlipidemia, unspecified: Secondary | ICD-10-CM | POA: Diagnosis not present

## 2016-11-09 DIAGNOSIS — I714 Abdominal aortic aneurysm, without rupture, unspecified: Secondary | ICD-10-CM

## 2016-11-09 DIAGNOSIS — I10 Essential (primary) hypertension: Secondary | ICD-10-CM

## 2016-11-09 NOTE — Assessment & Plan Note (Signed)
lipid control important in reducing the progression of atherosclerotic disease. Continue statin therapy  

## 2016-11-09 NOTE — Assessment & Plan Note (Signed)
Corrected with embolization procedure to IMA branches. Doing well following the procedure. No further signs of bleeding.

## 2016-11-09 NOTE — Progress Notes (Signed)
MRN : [id number]  Blake [name] is a 83 y.o. (Jan 02, 1923) male who presents with chief complaint of  Chief Complaint  Patient presents with  . Follow-up  .  History of Present Illness: Patient returns today in follow up of GI bleed status post embolization a couple of weeks ago. He has a scheduled visit for his aneurysm later this month and has no current aneurysm related symptoms. He has chronic low back pain which is unchanged. He denies abdominal pain. He denies signs of peripheral embolization. He did very well after the GI bleed embolization procedure. He stopped bleeding within about 24 hours has had no further bleeding at home. He denies any abdominal pain and he looks well and in his usual state of health today. His access site is well-healed.  Current Outpatient Prescriptions  Medication Sig Dispense Refill  . amLODipine (NORVASC) 5 MG tablet Take 5 mg by mouth daily.    Marland Kitchen azelastine (ASTELIN) 0.1 % nasal spray Place 1 spray into both nostrils 2 (two) times daily. Use in each nostril as directed    . finasteride (PROSCAR) 5 MG tablet Take 5 mg by mouth daily.    Marland Kitchen glimepiride (AMARYL) 2 MG tablet 2 tablets in the morning and one tablet at night (Patient taking differently: 4 mg. ) 90 tablet 11  . metoprolol tartrate (LOPRESSOR) 25 MG tablet Take 0.5 tablets (12.5 mg total) by mouth 2 (two) times daily. 30 tablet 2  . Multiple Vitamins-Minerals (PRESERVISION/LUTEIN PO) Take 1 capsule by mouth 2 (two) times daily.    . ranitidine (ZANTAC) 150 MG tablet Take 150 mg by mouth 2 (two) times daily.    . rosuvastatin (CRESTOR) 5 MG tablet Take 5 mg by mouth at bedtime.    . sitaGLIPtin (JANUVIA) 50 MG tablet Take 50 mg by mouth daily.    . tamsulosin (FLOMAX) 0.4 MG CAPS capsule Take 0.4 mg by mouth at bedtime.    . traMADol (ULTRAM) 50 MG tablet Take by mouth every 6 (six) hours as needed.    . triamcinolone cream (KENALOG) 0.5 % Apply 1 application topically 2 (two) times daily.      No current facility-administered medications for this visit.     Past Medical History:  Diagnosis Date  . AAA (abdominal aortic aneurysm) ([place])   . Arrhythmia   . Diabetes mellitus without complication ([place])   . GI bleed   . Heart murmur   . Hyperlipidemia   . Hypertension   . Prostate cancer [facility])     Past Surgical History:  Procedure Laterality Date  . FLEXIBLE SIGMOIDOSCOPY N/A 10/21/2016   Procedure: FLEXIBLE SIGMOIDOSCOPY;  Surgeon: [name], MD;  Location: [facility] ENDOSCOPY;  Service: Endoscopy;  Laterality: N/A;  . JOINT REPLACEMENT    . PACEMAKER INSERTION Left 04/14/2015   Procedure: INSERTION PACEMAKER;  Surgeon: Isaias Cowman, MD;  Location: [facility] ORS;  Service: Cardiovascular;  Laterality: Left;  . PROSTATE SURGERY    . VISCERAL ARTERY INTERVENTION N/A 10/22/2016   Procedure: Visceral Artery Intervention;  Surgeon: Algernon Huxley, MD;  Location: [facility];  Service: Cardiovascular;  Laterality: N/A;    Social History Social History  Substance Use Topics  . Smoking status: Former Research scientist (life sciences)  . Smokeless tobacco: Never Used  . Alcohol use No  Married  Family History Family History  Problem Relation Age of Onset  . Hypertension Other   . Stroke Other   . Heart attack Other   . Stroke Sister   .  Heart attack Brother   No bleeding or clotting disorders  Allergies  Allergen Reactions  . Diovan [Valsartan] Cough  . Hydralazine Itching  . Lisinopril Cough     REVIEW OF SYSTEMS (Negative unless checked)  Constitutional: _0 Weight loss  _1 Fever  _2 Chills Cardiac: _3 Chest pain   _4 Chest pressure   _5 Palpitations   _6 Shortness of breath when laying flat   _7 Shortness of breath at rest   _8 Shortness of breath with exertion. Vascular:  _9 Pain in legs with walking   _10 Pain in legs at rest   _11 Pain in legs when laying flat   _12 Claudication   _13 Pain in feet when walking  _14 Pain in feet at rest  _15 Pain in feet when laying flat   _16 History of DVT    _17 Phlebitis   _18 Swelling in legs   _19 Varicose veins   _20 Non-healing ulcers Pulmonary:   _21 Uses home oxygen   _22 Productive cough   _23 Hemoptysis   _24 Wheeze  _25 COPD   _26 Asthma Neurologic:  _27 Dizziness  _28 Blackouts   _29 Seizures   _30 History of stroke   _31 History of TIA  _32 Aphasia   _33 Temporary blindness   _34 Dysphagia   _35 Weakness or numbness in arms   _36 Weakness or numbness in legs Musculoskeletal:  _37 Arthritis   _38 Joint swelling   _39 Joint pain   _40 Low back pain Hematologic:  _41 Easy bruising  _42 Easy bleeding   _43 Hypercoagulable state   _44 Anemic   Gastrointestinal:  _45 Blood in stool   _46 Vomiting blood  _47 Gastroesophageal reflux/heartburn   _48 Abdominal pain Genitourinary:  _49 Chronic kidney disease   _50 Difficult urination  _51 Frequent urination  _52 Burning with urination   _53 Hematuria Skin:  _54 Rashes   _55 Ulcers   _56 Wounds Psychological:  _57 History of anxiety   _58  History of major depression.  Physical Examination  BP 131/69   Pulse 72   Resp 16   Wt 215 lb (97.5 kg)   BMI 27.60 kg/m  Gen:  WD/WN, NAD Head: [place]/AT, No temporalis wasting. Ear/Nose/Throat: Hearing grossly intact, nares w/o erythema or drainage, trachea midline Eyes: Conjunctiva clear. Sclera non-icteric Neck: Supple.  No JVD.  Pulmonary:  Good air movement, no use of accessory muscles.  Cardiac: RRR Vascular:  Vessel Right Left  Radial Palpable Palpable                                   Gastrointestinal: soft, non-tender/non-distended. No guarding/reflex.  Musculoskeletal: M/S 5/5 throughout.  No deformity or atrophy. Walking with a walker Neurologic: Sensation grossly intact in extremities.  Symmetrical.  Speech is fluent.  Psychiatric: Judgment intact, Mood & affect appropriate for pt's clinical situation. Dermatologic: No rashes or ulcers noted.  No cellulitis or open wounds.       Labs Recent Results (from the past 2160 hour(s))  Basic metabolic panel     Status: Abnormal   Collection Time: 10/09/16  4:53  PM  Result Value Ref Range   Sodium 138 135 - 145 mmol/L   Potassium 3.7 3.5 - 5.1 mmol/L   Chloride 105 101 - 111 mmol/L   CO2 24 22 - 32 mmol/L   Glucose, Bld 181 (H) 65 - 99 mg/dL   BUN 31 (H) 6 - 20 mg/dL   Creatinine, Ser 2.50 (H) 0.61 - 1.24 mg/dL   Calcium 8.4 (L) 8.9 - 10.3 mg/dL   GFR calc non Af Amer 21 (L) >60 mL/min   GFR calc Af Amer 24 (L) >60 mL/min    Comment: (NOTE) The eGFR  has been calculated using the CKD EPI equation. This calculation has not been validated in all clinical situations. eGFR's persistently <60 mL/min signify possible Chronic Kidney Disease.    Anion gap 9 5 - 15  CBC     Status: Abnormal   Collection Time: 10/09/16  4:53 PM  Result Value Ref Range   WBC 9.2 3.8 - 10.6 K/uL   RBC 3.66 (L) 4.40 - 5.90 MIL/uL   Hemoglobin 10.0 (L) 13.0 - 18.0 g/dL   HCT 30.3 (L) 40.0 - 52.0 %   MCV 82.9 80.0 - 100.0 fL   MCH 27.4 26.0 - 34.0 pg   MCHC 33.1 32.0 - 36.0 g/dL   RDW 16.3 (H) 11.5 - 14.5 %   Platelets 139 (L) 150 - 440 K/uL  Troponin I     Status: None   Collection Time: 10/09/16  4:53 PM  Result Value Ref Range   Troponin I <0.03 <0.03 ng/mL  Troponin I     Status: None   Collection Time: 10/09/16  7:59 PM  Result Value Ref Range   Troponin I <0.03 <0.03 ng/mL  Basic metabolic panel     Status: Abnormal   Collection Time: 10/09/16  7:59 PM  Result Value Ref Range   Sodium 133 (L) 135 - 145 mmol/L   Potassium 3.8 3.5 - 5.1 mmol/L   Chloride 97 (L) 101 - 111 mmol/L   CO2 22 22 - 32 mmol/L   Glucose, Bld 410 (H) 65 - 99 mg/dL   BUN 30 (H) 6 - 20 mg/dL   Creatinine, Ser 2.10 (H) 0.61 - 1.24 mg/dL   Calcium 7.6 (L) 8.9 - 10.3 mg/dL   GFR calc non Af Amer 26 (L) >60 mL/min   GFR calc Af Amer 30 (L) >60 mL/min    Comment: (NOTE) The eGFR has been calculated using the CKD EPI equation. This calculation has not been validated in all clinical situations. eGFR's persistently <60 mL/min signify possible Chronic Kidney Disease.    Anion gap 14  5 - 15  Blood gas, arterial     Status: Abnormal   Collection Time: 10/09/16  9:08 PM  Result Value Ref Range   FIO2 0.50    Delivery systems BILEVEL POSITIVE AIRWAY PRESSURE    Inspiratory PAP 10    Expiratory PAP 5    pH, Arterial 7.32 (L) 7.350 - 7.450   pCO2 arterial 38 32.0 - 48.0 mmHg   pO2, Arterial 51 (L) 83.0 - 108.0 mmHg   Bicarbonate 19.6 (L) 20.0 - 28.0 mmol/L   Acid-base deficit 6.0 (H) 0.0 - 2.0 mmol/L   O2 Saturation 82.3 %   Patient temperature 37.0    Collection site LEFT RADIAL    Sample type ARTERIAL DRAW    Allens test (pass/fail) PASS PASS  Blood gas, arterial     Status: Abnormal   Collection Time: 10/09/16 11:00 PM  Result Value Ref Range   FIO2 0.65    Delivery systems BILEVEL POSITIVE AIRWAY PRESSURE    Inspiratory PAP 10    Expiratory PAP 5    pH, Arterial 7.38 7.350 - 7.450   pCO2 arterial 36 32.0 - 48.0 mmHg   pO2, Arterial 81 (L) 83.0 - 108.0 mmHg   Bicarbonate 21.3 20.0 - 28.0 mmol/L   Acid-base deficit 3.3 (H) 0.0 - 2.0 mmol/L   O2 Saturation 95.6 %   Patient temperature 37.0    Collection site RIGHT RADIAL    Sample type ARTERIAL DRAW  Allens test (pass/fail) PASS PASS  Glucose, capillary     Status: Abnormal   Collection Time: 10/09/16 11:43 PM  Result Value Ref Range   Glucose-Capillary 210 (H) 65 - 99 mg/dL  MRSA PCR Screening     Status: None   Collection Time: 10/09/16 11:51 PM  Result Value Ref Range   MRSA by PCR NEGATIVE NEGATIVE    Comment:        The GeneXpert MRSA Assay (FDA approved for NASAL specimens only), is one component of a comprehensive MRSA colonization surveillance program. It is not intended to diagnose MRSA infection nor to guide or monitor treatment for MRSA infections.   Comprehensive metabolic panel     Status: Abnormal   Collection Time: 10/10/16  1:23 AM  Result Value Ref Range   Sodium 137 135 - 145 mmol/L   Potassium 4.2 3.5 - 5.1 mmol/L   Chloride 106 101 - 111 mmol/L   CO2 24 22 - 32 mmol/L    Glucose, Bld 214 (H) 65 - 99 mg/dL   BUN 33 (H) 6 - 20 mg/dL   Creatinine, Ser 2.35 (H) 0.61 - 1.24 mg/dL   Calcium 8.2 (L) 8.9 - 10.3 mg/dL   Total Protein 7.4 6.5 - 8.1 g/dL   Albumin 3.1 (L) 3.5 - 5.0 g/dL   AST 20 15 - 41 U/L   ALT 13 (L) 17 - 63 U/L   Alkaline Phosphatase 82 38 - 126 U/L   Total Bilirubin 0.8 0.3 - 1.2 mg/dL   GFR calc non Af Amer 23 (L) >60 mL/min   GFR calc Af Amer 26 (L) >60 mL/min    Comment: (NOTE) The eGFR has been calculated using the CKD EPI equation. This calculation has not been validated in all clinical situations. eGFR's persistently <60 mL/min signify possible Chronic Kidney Disease.    Anion gap 7 5 - 15  CBC     Status: Abnormal   Collection Time: 10/10/16  1:23 AM  Result Value Ref Range   WBC 7.5 3.8 - 10.6 K/uL   RBC 4.17 (L) 4.40 - 5.90 MIL/uL   Hemoglobin 11.6 (L) 13.0 - 18.0 g/dL   HCT 35.1 (L) 40.0 - 52.0 %   MCV 84.3 80.0 - 100.0 fL   MCH 27.8 26.0 - 34.0 pg   MCHC 33.0 32.0 - 36.0 g/dL   RDW 16.0 (H) 11.5 - 14.5 %   Platelets 138 (L) 150 - 440 K/uL  Troponin I     Status: None   Collection Time: 10/10/16  4:31 AM  Result Value Ref Range   Troponin I <0.03 <0.03 ng/mL  Glucose, capillary     Status: Abnormal   Collection Time: 10/10/16  7:38 AM  Result Value Ref Range   Glucose-Capillary 193 (H) 65 - 99 mg/dL  Culture, expectorated sputum-assessment     Status: None   Collection Time: 10/10/16 11:25 AM  Result Value Ref Range   Specimen Description SPUTUM    Special Requests NONE    Sputum evaluation THIS SPECIMEN IS ACCEPTABLE FOR SPUTUM CULTURE    Report Status 10/10/2016 FINAL   Culture, respiratory (NON-Expectorated)     Status: None   Collection Time: 10/10/16 11:25 AM  Result Value Ref Range   Specimen Description SPUTUM    Special Requests NONE Reflexed from [id number]    Gram Stain      ABUNDANT WBC PRESENT, PREDOMINANTLY PMN MODERATE GRAM POSITIVE COCCI IN PAIRS    Culture  Consistent with normal  respiratory flora. Performed at [facility], [place] [address] [place], [place] [address]    Report Status 10/13/2016 FINAL   Glucose, capillary     Status: Abnormal   Collection Time: 10/10/16 12:17 PM  Result Value Ref Range   Glucose-Capillary 213 (H) 65 - 99 mg/dL  Urinalysis, Complete w Microscopic     Status: Abnormal   Collection Time: 10/10/16 12:50 PM  Result Value Ref Range   Color, Urine YELLOW (A) YELLOW   APPearance HAZY (A) CLEAR   Specific Gravity, Urine 1.014 1.005 - 1.030   pH 5.0 5.0 - 8.0   Glucose, UA 50 (A) NEGATIVE mg/dL   Hgb urine dipstick LARGE (A) NEGATIVE   Bilirubin Urine NEGATIVE NEGATIVE   Ketones, ur NEGATIVE NEGATIVE mg/dL   Protein, ur 30 (A) NEGATIVE mg/dL   Nitrite NEGATIVE NEGATIVE   Leukocytes, UA SMALL (A) NEGATIVE   RBC / HPF TOO NUMEROUS TO COUNT 0 - 5 RBC/hpf   WBC, UA 6-30 0 - 5 WBC/hpf   Bacteria, UA NONE SEEN NONE SEEN   Squamous Epithelial / LPF NONE SEEN NONE SEEN   WBC Clumps PRESENT    Mucous PRESENT   Glucose, capillary     Status: Abnormal   Collection Time: 10/10/16  5:12 PM  Result Value Ref Range   Glucose-Capillary 159 (H) 65 - 99 mg/dL  Glucose, capillary     Status: Abnormal   Collection Time: 10/10/16  8:11 PM  Result Value Ref Range   Glucose-Capillary 175 (H) 65 - 99 mg/dL  CBC     Status: Abnormal   Collection Time: 10/11/16  4:55 AM  Result Value Ref Range   WBC 11.5 (H) 3.8 - 10.6 K/uL   RBC 3.55 (L) 4.40 - 5.90 MIL/uL   Hemoglobin 9.6 (L) 13.0 - 18.0 g/dL   HCT 29.9 (L) 40.0 - 52.0 %   MCV 84.1 80.0 - 100.0 fL   MCH 27.1 26.0 - 34.0 pg   MCHC 32.3 32.0 - 36.0 g/dL   RDW 16.0 (H) 11.5 - 14.5 %   Platelets 115 (L) 150 - 440 K/uL  Basic metabolic panel     Status: Abnormal   Collection Time: 10/11/16  4:55 AM  Result Value Ref Range   Sodium 136 135 - 145 mmol/L   Potassium 3.9 3.5 - 5.1 mmol/L   Chloride 104 101 - 111 mmol/L   CO2 23 22 - 32 mmol/L   Glucose, Bld 138 (H) 65 - 99 mg/dL   BUN 36  (H) 6 - 20 mg/dL   Creatinine, Ser 2.34 (H) 0.61 - 1.24 mg/dL   Calcium 8.2 (L) 8.9 - 10.3 mg/dL   GFR calc non Af Amer 23 (L) >60 mL/min   GFR calc Af Amer 26 (L) >60 mL/min    Comment: (NOTE) The eGFR has been calculated using the CKD EPI equation. This calculation has not been validated in all clinical situations. eGFR's persistently <60 mL/min signify possible Chronic Kidney Disease.    Anion gap 9 5 - 15  Glucose, capillary     Status: Abnormal   Collection Time: 10/11/16  7:39 AM  Result Value Ref Range   Glucose-Capillary 134 (H) 65 - 99 mg/dL  Glucose, capillary     Status: Abnormal   Collection Time: 10/11/16 11:32 AM  Result Value Ref Range   Glucose-Capillary 208 (H) 65 - 99 mg/dL  ECHOCARDIOGRAM COMPLETE     Status: None  Collection Time: 10/11/16  2:24 PM  Result Value Ref Range   Weight 3,488.56 oz   Height 74 in   BP 125/62 mmHg  Glucose, capillary     Status: Abnormal   Collection Time: 10/11/16  5:10 PM  Result Value Ref Range   Glucose-Capillary 165 (H) 65 - 99 mg/dL  Glucose, capillary     Status: Abnormal   Collection Time: 10/11/16  8:49 PM  Result Value Ref Range   Glucose-Capillary 166 (H) 65 - 99 mg/dL  Glucose, capillary     Status: Abnormal   Collection Time: 10/12/16  7:41 AM  Result Value Ref Range   Glucose-Capillary 145 (H) 65 - 99 mg/dL  Basic metabolic panel     Status: Abnormal   Collection Time: 10/12/16  8:22 AM  Result Value Ref Range   Sodium 136 135 - 145 mmol/L   Potassium 3.5 3.5 - 5.1 mmol/L   Chloride 105 101 - 111 mmol/L   CO2 24 22 - 32 mmol/L   Glucose, Bld 152 (H) 65 - 99 mg/dL   BUN 33 (H) 6 - 20 mg/dL   Creatinine, Ser 2.25 (H) 0.61 - 1.24 mg/dL   Calcium 8.1 (L) 8.9 - 10.3 mg/dL   GFR calc non Af Amer 24 (L) >60 mL/min   GFR calc Af Amer 28 (L) >60 mL/min    Comment: (NOTE) The eGFR has been calculated using the CKD EPI equation. This calculation has not been validated in all clinical situations. eGFR's  persistently <60 mL/min signify possible Chronic Kidney Disease.    Anion gap 7 5 - 15  Glucose, capillary     Status: Abnormal   Collection Time: 10/12/16 11:36 AM  Result Value Ref Range   Glucose-Capillary 228 (H) 65 - 99 mg/dL  Glucose, capillary     Status: Abnormal   Collection Time: 10/12/16 11:38 AM  Result Value Ref Range   Glucose-Capillary 209 (H) 65 - 99 mg/dL  Glucose, capillary     Status: Abnormal   Collection Time: 10/12/16  4:47 PM  Result Value Ref Range   Glucose-Capillary 121 (H) 65 - 99 mg/dL  Glucose, capillary     Status: Abnormal   Collection Time: 10/12/16  8:39 PM  Result Value Ref Range   Glucose-Capillary 165 (H) 65 - 99 mg/dL  Basic metabolic panel     Status: Abnormal   Collection Time: 10/13/16  3:43 AM  Result Value Ref Range   Sodium 139 135 - 145 mmol/L   Potassium 3.5 3.5 - 5.1 mmol/L   Chloride 107 101 - 111 mmol/L   CO2 24 22 - 32 mmol/L   Glucose, Bld 145 (H) 65 - 99 mg/dL   BUN 29 (H) 6 - 20 mg/dL   Creatinine, Ser 2.01 (H) 0.61 - 1.24 mg/dL   Calcium 8.1 (L) 8.9 - 10.3 mg/dL   GFR calc non Af Amer 27 (L) >60 mL/min   GFR calc Af Amer 32 (L) >60 mL/min    Comment: (NOTE) The eGFR has been calculated using the CKD EPI equation. This calculation has not been validated in all clinical situations. eGFR's persistently <60 mL/min signify possible Chronic Kidney Disease.    Anion gap 8 5 - 15  Glucose, capillary     Status: Abnormal   Collection Time: 10/13/16  7:25 AM  Result Value Ref Range   Glucose-Capillary 138 (H) 65 - 99 mg/dL  Glucose, capillary     Status: Abnormal   Collection Time:  10/13/16 11:42 AM  Result Value Ref Range   Glucose-Capillary 190 (H) 65 - 99 mg/dL  Type and screen     Status: None   Collection Time: 10/20/16  9:54 PM  Result Value Ref Range   ABO/RH(D) B POS    Antibody Screen NEG    Sample Expiration 10/23/2016    Unit Number [id number]    Blood Component Type RED CELLS,LR    Unit division 00     Status of Unit REL FROM [facility]    Transfusion Status OK TO TRANSFUSE    Crossmatch Result Compatible    Unit Number [id number]    Blood Component Type RED CELLS,LR    Unit division 00    Status of Unit ISSUED,FINAL    Transfusion Status OK TO TRANSFUSE    Crossmatch Result Compatible    Unit Number [id number]    Blood Component Type RED CELLS,LR    Unit division 00    Status of Unit ISSUED,FINAL    Transfusion Status OK TO TRANSFUSE    Crossmatch Result Compatible    Unit Number [id number]    Blood Component Type RBC, LR IRR    Unit division 00    Status of Unit ISSUED,FINAL    Transfusion Status OK TO TRANSFUSE    Crossmatch Result Compatible   BPAM RBC     Status: None   Collection Time: 10/20/16  9:54 PM  Result Value Ref Range   ISSUE DATE / TIME 939030092330    Blood Product Unit Number [id number]    Unit Type and Rh 7300    Blood Product Expiration Date 256389373428    ISSUE DATE / TIME 768115726203    Blood Product Unit Number [id number]    PRODUCT CODE E0336V00    Unit Type and Rh 7300    Blood Product Expiration Date 468032122482    ISSUE DATE / TIME 500370488891    Blood Product Unit Number [id number]    PRODUCT CODE E0336V00    Unit Type and Rh 1700    Blood Product Expiration Date 349179150569    ISSUE DATE / TIME 794801655374    Blood Product Unit Number [id number]    PRODUCT CODE E1007H21    Unit Type and Rh 7300    Blood Product Expiration Date 975883254982   CBC     Status: Abnormal   Collection Time: 10/20/16 10:00 PM  Result Value Ref Range   WBC 9.2 3.8 - 10.6 K/uL   RBC 3.55 (L) 4.40 - 5.90 MIL/uL   Hemoglobin 9.5 (L) 13.0 - 18.0 g/dL   HCT 29.2 (L) 40.0 - 52.0 %   MCV 82.1 80.0 - 100.0 fL   MCH 26.8 26.0 - 34.0 pg   MCHC 32.6 32.0 - 36.0 g/dL   RDW 16.2 (H) 11.5 - 14.5 %   Platelets 173 150 - 440 K/uL  Basic metabolic panel     Status: Abnormal   Collection Time: 10/20/16 10:00 PM  Result Value Ref Range   Sodium 134  (L) 135 - 145 mmol/L   Potassium 3.7 3.5 - 5.1 mmol/L   Chloride 102 101 - 111 mmol/L   CO2 25 22 - 32 mmol/L   Glucose, Bld 216 (H) 65 - 99 mg/dL   BUN 30 (H) 6 - 20 mg/dL   Creatinine, Ser 1.98 (H) 0.61 - 1.24 mg/dL   Calcium 8.4 (L) 8.9 - 10.3 mg/dL   GFR calc non Af Amer 28 (L) >60 mL/min   GFR calc Af  Amer 32 (L) >60 mL/min    Comment: (NOTE) The eGFR has been calculated using the CKD EPI equation. This calculation has not been validated in all clinical situations. eGFR's persistently <60 mL/min signify possible Chronic Kidney Disease.    Anion gap 7 5 - 15  Protime-INR     Status: Abnormal   Collection Time: 10/20/16 10:00 PM  Result Value Ref Range   Prothrombin Time 17.2 (H) 11.4 - 15.2 seconds   INR 1.39   APTT     Status: Abnormal   Collection Time: 10/20/16 10:00 PM  Result Value Ref Range   aPTT 47 (H) 24 - 36 seconds    Comment:        IF BASELINE aPTT IS ELEVATED, SUGGEST PATIENT RISK ASSESSMENT BE USED TO DETERMINE APPROPRIATE ANTICOAGULANT THERAPY.   Heparin level - if patient on rivaroxaban (XARELTO) or apixaban [place]ne [place])     Status: Abnormal   Collection Time: 10/20/16 10:00 PM  Result Value Ref Range   Heparin Unfractionated 2.38 (H) 0.30 - 0.70 IU/mL    Comment: RESULTS CONFIRMED BY MANUAL DILUTION        IF HEPARIN RESULTS ARE BELOW EXPECTED VALUES, AND PATIENT DOSAGE HAS BEEN CONFIRMED, SUGGEST FOLLOW UP TESTING OF ANTITHROMBIN III LEVELS.   Lactic acid, plasma     Status: Abnormal   Collection Time: 10/20/16 10:05 PM  Result Value Ref Range   Lactic Acid, Venous 2.5 (HH) 0.5 - 1.9 mmol/L    Comment: CRITICAL RESULT CALLED TO, READ BACK BY AND VERIFIED WITH HENRY REVERA @ 2311 ON 10/20/2016 BY CAF   Prepare RBC     Status: None   Collection Time: 10/20/16 10:06 PM  Result Value Ref Range   Order Confirmation ORDER PROCESSED BY BLOOD BANK   Protime-INR     Status: Abnormal   Collection Time: 10/21/16  2:02 AM  Result Value Ref Range    Prothrombin Time 16.1 (H) 11.4 - 15.2 seconds   INR 1.28   APTT     Status: Abnormal   Collection Time: 10/21/16  2:02 AM  Result Value Ref Range   aPTT 43 (H) 24 - 36 seconds    Comment:        IF BASELINE aPTT IS ELEVATED, SUGGEST PATIENT RISK ASSESSMENT BE USED TO DETERMINE APPROPRIATE ANTICOAGULANT THERAPY.   CBC     Status: Abnormal   Collection Time: 10/21/16  2:02 AM  Result Value Ref Range   WBC 7.2 3.8 - 10.6 K/uL   RBC 2.62 (L) 4.40 - 5.90 MIL/uL   Hemoglobin 7.1 (L) 13.0 - 18.0 g/dL   HCT 21.6 (L) 40.0 - 52.0 %   MCV 82.5 80.0 - 100.0 fL   MCH 27.2 26.0 - 34.0 pg   MCHC 32.9 32.0 - 36.0 g/dL   RDW 16.0 (H) 11.5 - 14.5 %   Platelets 127 (L) 150 - 440 K/uL  Glucose, capillary     Status: Abnormal   Collection Time: 10/21/16  2:14 AM  Result Value Ref Range   Glucose-Capillary 163 (H) 65 - 99 mg/dL  Prepare RBC     Status: None   Collection Time: 10/21/16  4:30 AM  Result Value Ref Range   Order Confirmation ORDER PROCESSED BY BLOOD BANK   Glucose, capillary     Status: Abnormal   Collection Time: 10/21/16  8:06 AM  Result Value Ref Range   Glucose-Capillary 130 (H) 65 - 99 mg/dL  Protime-INR     Status: Abnormal  Collection Time: 10/21/16  9:05 AM  Result Value Ref Range   Prothrombin Time 16.6 (H) 11.4 - 15.2 seconds   INR 1.33   APTT     Status: None   Collection Time: 10/21/16  9:05 AM  Result Value Ref Range   aPTT 35 24 - 36 seconds  Basic metabolic panel     Status: Abnormal   Collection Time: 10/21/16  9:05 AM  Result Value Ref Range   Sodium 138 135 - 145 mmol/L   Potassium 3.7 3.5 - 5.1 mmol/L   Chloride 109 101 - 111 mmol/L   CO2 24 22 - 32 mmol/L   Glucose, Bld 117 (H) 65 - 99 mg/dL   BUN 31 (H) 6 - 20 mg/dL   Creatinine, Ser 1.93 (H) 0.61 - 1.24 mg/dL   Calcium 7.9 (L) 8.9 - 10.3 mg/dL   GFR calc non Af Amer 29 (L) >60 mL/min   GFR calc Af Amer 33 (L) >60 mL/min    Comment: (NOTE) The eGFR has been calculated using the CKD EPI  equation. This calculation has not been validated in all clinical situations. eGFR's persistently <60 mL/min signify possible Chronic Kidney Disease.    Anion gap 5 5 - 15  CBC     Status: Abnormal   Collection Time: 10/21/16  9:05 AM  Result Value Ref Range   WBC 7.6 3.8 - 10.6 K/uL   RBC 2.58 (L) 4.40 - 5.90 MIL/uL   Hemoglobin 7.0 (L) 13.0 - 18.0 g/dL   HCT 21.4 (L) 40.0 - 52.0 %   MCV 82.8 80.0 - 100.0 fL   MCH 27.0 26.0 - 34.0 pg   MCHC 32.7 32.0 - 36.0 g/dL   RDW 16.3 (H) 11.5 - 14.5 %   Platelets 118 (L) 150 - 440 K/uL  Heparin level (unfractionated)     Status: Abnormal   Collection Time: 10/21/16  9:05 AM  Result Value Ref Range   Heparin Unfractionated 1.56 (H) 0.30 - 0.70 IU/mL    Comment:        IF HEPARIN RESULTS ARE BELOW EXPECTED VALUES, AND PATIENT DOSAGE HAS BEEN CONFIRMED, SUGGEST FOLLOW UP TESTING OF ANTITHROMBIN III LEVELS.   Glucose, capillary     Status: Abnormal   Collection Time: 10/21/16 12:30 PM  Result Value Ref Range   Glucose-Capillary 110 (H) 65 - 99 mg/dL  Glucose, capillary     Status: Abnormal   Collection Time: 10/21/16  4:29 PM  Result Value Ref Range   Glucose-Capillary 112 (H) 65 - 99 mg/dL  Protime-INR     Status: Abnormal   Collection Time: 10/21/16  4:39 PM  Result Value Ref Range   Prothrombin Time 16.8 (H) 11.4 - 15.2 seconds   INR 1.35   APTT     Status: Abnormal   Collection Time: 10/21/16  4:39 PM  Result Value Ref Range   aPTT 42 (H) 24 - 36 seconds    Comment:        IF BASELINE aPTT IS ELEVATED, SUGGEST PATIENT RISK ASSESSMENT BE USED TO DETERMINE APPROPRIATE ANTICOAGULANT THERAPY.   Hemoglobin     Status: Abnormal   Collection Time: 10/21/16  4:39 PM  Result Value Ref Range   Hemoglobin 7.1 (L) 13.0 - 18.0 g/dL  Prepare RBC     Status: None   Collection Time: 10/21/16  6:00 PM  Result Value Ref Range   Order Confirmation ORDER PROCESSED BY BLOOD BANK   Glucose, capillary  Status: Abnormal   Collection  Time: 10/21/16  8:12 PM  Result Value Ref Range   Glucose-Capillary 127 (H) 65 - 99 mg/dL   Comment 1 Notify RN    Comment 2 Document in Chart   Protime-INR     Status: Abnormal   Collection Time: 10/21/16  9:44 PM  Result Value Ref Range   Prothrombin Time 16.7 (H) 11.4 - 15.2 seconds   INR 1.34   APTT     Status: Abnormal   Collection Time: 10/21/16  9:44 PM  Result Value Ref Range   aPTT 41 (H) 24 - 36 seconds    Comment:        IF BASELINE aPTT IS ELEVATED, SUGGEST PATIENT RISK ASSESSMENT BE USED TO DETERMINE APPROPRIATE ANTICOAGULANT THERAPY.   Hemoglobin     Status: Abnormal   Collection Time: 10/21/16  9:44 PM  Result Value Ref Range   Hemoglobin 7.7 (L) 13.0 - 18.0 g/dL  Glucose, capillary     Status: Abnormal   Collection Time: 10/21/16 11:51 PM  Result Value Ref Range   Glucose-Capillary 117 (H) 65 - 99 mg/dL  Glucose, capillary     Status: Abnormal   Collection Time: 10/22/16  4:02 AM  Result Value Ref Range   Glucose-Capillary 126 (H) 65 - 99 mg/dL  CBC     Status: Abnormal   Collection Time: 10/22/16  5:13 AM  Result Value Ref Range   WBC 6.9 3.8 - 10.6 K/uL   RBC 2.76 (L) 4.40 - 5.90 MIL/uL   Hemoglobin 7.6 (L) 13.0 - 18.0 g/dL   HCT 22.8 (L) 40.0 - 52.0 %   MCV 82.9 80.0 - 100.0 fL   MCH 27.5 26.0 - 34.0 pg   MCHC 33.1 32.0 - 36.0 g/dL   RDW 15.9 (H) 11.5 - 14.5 %   Platelets 115 (L) 150 - 440 K/uL  Basic metabolic panel     Status: Abnormal   Collection Time: 10/22/16  5:13 AM  Result Value Ref Range   Sodium 140 135 - 145 mmol/L   Potassium 3.9 3.5 - 5.1 mmol/L   Chloride 111 101 - 111 mmol/L   CO2 25 22 - 32 mmol/L   Glucose, Bld 126 (H) 65 - 99 mg/dL   BUN 26 (H) 6 - 20 mg/dL   Creatinine, Ser 1.70 (H) 0.61 - 1.24 mg/dL   Calcium 7.9 (L) 8.9 - 10.3 mg/dL   GFR calc non Af Amer 33 (L) >60 mL/min   GFR calc Af Amer 39 (L) >60 mL/min    Comment: (NOTE) The eGFR has been calculated using the CKD EPI equation. This calculation has not been  validated in all clinical situations. eGFR's persistently <60 mL/min signify possible Chronic Kidney Disease.    Anion gap 4 (L) 5 - 15  Protime-INR     Status: Abnormal   Collection Time: 10/22/16  5:13 AM  Result Value Ref Range   Prothrombin Time 16.5 (H) 11.4 - 15.2 seconds   INR 1.32   Glucose, capillary     Status: Abnormal   Collection Time: 10/22/16  7:21 AM  Result Value Ref Range   Glucose-Capillary 102 (H) 65 - 99 mg/dL  Prepare RBC     Status: None   Collection Time: 10/22/16 10:00 AM  Result Value Ref Range   Order Confirmation ORDER PROCESSED BY BLOOD BANK   Glucose, capillary     Status: Abnormal   Collection Time: 10/22/16 11:36 AM  Result Value Ref  Range   Glucose-Capillary 125 (H) 65 - 99 mg/dL  Hemoglobin     Status: Abnormal   Collection Time: 10/22/16  3:19 PM  Result Value Ref Range   Hemoglobin 8.5 (L) 13.0 - 18.0 g/dL  Glucose, capillary     Status: Abnormal   Collection Time: 10/22/16  4:41 PM  Result Value Ref Range   Glucose-Capillary 149 (H) 65 - 99 mg/dL  Glucose, capillary     Status: Abnormal   Collection Time: 10/22/16  8:43 PM  Result Value Ref Range   Glucose-Capillary 167 (H) 65 - 99 mg/dL   Comment 1 Notify RN    Comment 2 Document in Chart   Glucose, capillary     Status: Abnormal   Collection Time: 10/23/16 12:28 AM  Result Value Ref Range   Glucose-Capillary 172 (H) 65 - 99 mg/dL  Glucose, capillary     Status: Abnormal   Collection Time: 10/23/16  5:05 AM  Result Value Ref Range   Glucose-Capillary 129 (H) 65 - 99 mg/dL  CBC     Status: Abnormal   Collection Time: 10/23/16  5:51 AM  Result Value Ref Range   WBC 15.0 (H) 3.8 - 10.6 K/uL   RBC 3.00 (L) 4.40 - 5.90 MIL/uL   Hemoglobin 8.5 (L) 13.0 - 18.0 g/dL   HCT 25.5 (L) 40.0 - 52.0 %   MCV 85.0 80.0 - 100.0 fL   MCH 28.4 26.0 - 34.0 pg   MCHC 33.4 32.0 - 36.0 g/dL   RDW 15.8 (H) 11.5 - 14.5 %   Platelets 124 (L) 150 - 440 K/uL  Basic metabolic panel     Status: Abnormal    Collection Time: 10/23/16  5:51 AM  Result Value Ref Range   Sodium 140 135 - 145 mmol/L   Potassium 3.6 3.5 - 5.1 mmol/L   Chloride 111 101 - 111 mmol/L   CO2 23 22 - 32 mmol/L   Glucose, Bld 118 (H) 65 - 99 mg/dL   BUN 19 6 - 20 mg/dL   Creatinine, Ser 1.36 (H) 0.61 - 1.24 mg/dL   Calcium 8.0 (L) 8.9 - 10.3 mg/dL   GFR calc non Af Amer 44 (L) >60 mL/min   GFR calc Af Amer 51 (L) >60 mL/min    Comment: (NOTE) The eGFR has been calculated using the CKD EPI equation. This calculation has not been validated in all clinical situations. eGFR's persistently <60 mL/min signify possible Chronic Kidney Disease.    Anion gap 6 5 - 15  Glucose, capillary     Status: None   Collection Time: 10/23/16  7:22 AM  Result Value Ref Range   Glucose-Capillary 90 65 - 99 mg/dL  Glucose, capillary     Status: Abnormal   Collection Time: 10/23/16 11:14 AM  Result Value Ref Range   Glucose-Capillary 158 (H) 65 - 99 mg/dL  Glucose, capillary     Status: Abnormal   Collection Time: 10/23/16  4:25 PM  Result Value Ref Range   Glucose-Capillary 124 (H) 65 - 99 mg/dL  Glucose, capillary     Status: Abnormal   Collection Time: 10/23/16  7:41 PM  Result Value Ref Range   Glucose-Capillary 158 (H) 65 - 99 mg/dL  Glucose, capillary     Status: Abnormal   Collection Time: 10/23/16 11:26 PM  Result Value Ref Range   Glucose-Capillary 105 (H) 65 - 99 mg/dL  Hemoglobin     Status: Abnormal   Collection Time: 10/24/16  5:19 AM  Result Value Ref Range   Hemoglobin 7.2 (L) 13.0 - 18.0 g/dL  Basic metabolic panel     Status: Abnormal   Collection Time: 10/24/16  5:19 AM  Result Value Ref Range   Sodium 138 135 - 145 mmol/L   Potassium 3.5 3.5 - 5.1 mmol/L   Chloride 108 101 - 111 mmol/L   CO2 23 22 - 32 mmol/L   Glucose, Bld 123 (H) 65 - 99 mg/dL   BUN 14 6 - 20 mg/dL   Creatinine, Ser 1.64 (H) 0.61 - 1.24 mg/dL   Calcium 7.6 (L) 8.9 - 10.3 mg/dL   GFR calc non Af Amer 35 (L) >60 mL/min   GFR calc  Af Amer 41 (L) >60 mL/min    Comment: (NOTE) The eGFR has been calculated using the CKD EPI equation. This calculation has not been validated in all clinical situations. eGFR's persistently <60 mL/min signify possible Chronic Kidney Disease.    Anion gap 7 5 - 15  Glucose, capillary     Status: Abnormal   Collection Time: 10/24/16  7:40 AM  Result Value Ref Range   Glucose-Capillary 117 (H) 65 - 99 mg/dL  Glucose, capillary     Status: Abnormal   Collection Time: 10/24/16 11:43 AM  Result Value Ref Range   Glucose-Capillary 219 (H) 65 - 99 mg/dL  Glucose, capillary     Status: None   Collection Time: 10/24/16  4:38 PM  Result Value Ref Range   Glucose-Capillary 92 65 - 99 mg/dL  Glucose, capillary     Status: Abnormal   Collection Time: 10/24/16  9:21 PM  Result Value Ref Range   Glucose-Capillary 202 (H) 65 - 99 mg/dL  Hemoglobin     Status: Abnormal   Collection Time: 10/25/16  4:31 AM  Result Value Ref Range   Hemoglobin 7.4 (L) 13.0 - 18.0 g/dL  Basic metabolic panel     Status: Abnormal   Collection Time: 10/25/16  4:31 AM  Result Value Ref Range   Sodium 138 135 - 145 mmol/L   Potassium 3.4 (L) 3.5 - 5.1 mmol/L   Chloride 107 101 - 111 mmol/L   CO2 24 22 - 32 mmol/L   Glucose, Bld 129 (H) 65 - 99 mg/dL   BUN 15 6 - 20 mg/dL   Creatinine, Ser 1.81 (H) 0.61 - 1.24 mg/dL   Calcium 7.7 (L) 8.9 - 10.3 mg/dL   GFR calc non Af Amer 31 (L) >60 mL/min   GFR calc Af Amer 36 (L) >60 mL/min    Comment: (NOTE) The eGFR has been calculated using the CKD EPI equation. This calculation has not been validated in all clinical situations. eGFR's persistently <60 mL/min signify possible Chronic Kidney Disease.    Anion gap 7 5 - 15  Magnesium     Status: None   Collection Time: 10/25/16  4:31 AM  Result Value Ref Range   Magnesium 1.7 1.7 - 2.4 mg/dL  Glucose, capillary     Status: Abnormal   Collection Time: 10/25/16  7:26 AM  Result Value Ref Range   Glucose-Capillary 129  (H) 65 - 99 mg/dL   Comment 1 Notify RN   Prepare RBC     Status: None   Collection Time: 10/25/16  8:30 AM  Result Value Ref Range   Order Confirmation ORDER PROCESSED BY BLOOD BANK   Type and screen Hold 2 units now and stay ahead 2 units at all times  Status: None   Collection Time: 10/25/16  9:26 AM  Result Value Ref Range   ABO/RH(D) B POS    Antibody Screen NEG    Sample Expiration 10/28/2016    Unit Number [id number]    Blood Component Type RED CELLS,LR    Unit division 00    Status of Unit ISSUED,FINAL    Transfusion Status OK TO TRANSFUSE    Crossmatch Result Compatible   BPAM RBC     Status: None   Collection Time: 10/25/16  9:26 AM  Result Value Ref Range   ISSUE DATE / TIME [id number]    Blood Product Unit Number [id number]    PRODUCT CODE [id number]    Unit Type and Rh 7300    Blood Product Expiration Date [id number]   Glucose, capillary     Status: Abnormal   Collection Time: 10/25/16 11:50 AM  Result Value Ref Range   Glucose-Capillary 172 (H) 65 - 99 mg/dL   Comment 1 Notify RN     Radiology Dg Chest 2 View  Result Date: 10/22/2016 CLINICAL DATA:  Possible aspiration. EXAM: CHEST  2 VIEW COMPARISON:  10/09/2016 chest radiograph FINDINGS: Stable cardiac silhouette given projection and technique. Aortic atherosclerosis with calcification. Resolution of right lung base consolidation. No new focal consolidation identified. Stable linear opacity in the right costal diaphragmatic angle probably represents scarring. No pleural effusion or pneumothorax. Bones are unremarkable. Two lead pacemaker. IMPRESSION: Interval resolution of right lung base consolidation. No new acute pulmonary process identified. Electronically Signed   By: Kristine Garbe M.D.   On: 10/22/2016 21:49   Nm Gi Blood Loss  Result Date: 10/21/2016 CLINICAL DATA:  83 year old male with a history of bright red blood per rectum EXAM: NUCLEAR MEDICINE GASTROINTESTINAL BLEEDING SCAN  TECHNIQUE: Sequential abdominal images were obtained following intravenous administration of Tc-59mlabeled red blood cells. RADIOPHARMACEUTICALS:  21.5 mCi Tc-969mn-vitro labeled red cells. COMPARISON:  None. FINDINGS: Anterior planar imaging was acquired over the course of 2 hours after administration of radiotracer IV. First our imaging demonstrates adequate appearance of the blood pool, with first pass through liver and heart. Early during the first hour, there is accumulation of radiotracer in the central pelvis at the 6-10 minute interval. This does not have the configuration of urinary bladder, and does persist through the second hour of imaging. IMPRESSION: Nuclear medicine GI bleeding study suggestive of bleeding in the distal sigmoid colon or rectum. These results were called by telephone at the time of interpretation on 10/21/2016 at 4:10 pm to the nurse caring for the patient, Ms KrTamala Fothergillho verbally acknowledged these results. Electronically Signed   By: JaCorrie [email or web address].   On: 10/21/2016 16:13    Assessment/Plan  HLD (hyperlipidemia) lipid control important in reducing the progression of atherosclerotic disease. Continue statin therapy   Lower GI bleed Corrected with embolization procedure to IMA branches. Doing well following the procedure. No further signs of bleeding.  [place].  Benign hypertension blood pressure control important in reducing the progression of atherosclerotic disease and aneurysmal degeneration. On appropriate oral medications.     JaLeotis PainMD  11/09/2016 2:37 PM    This note was created with Dragon medical transcription system.  Any errors from dictation are purely unintentional

## 2016-11-09 NOTE — Assessment & Plan Note (Signed)
blood pressure control important in reducing the progression of atherosclerotic disease and aneurysmal degeneration. On appropriate oral medications.  

## 2016-11-09 NOTE — Assessment & Plan Note (Signed)
To be checked later this month duplex.

## 2016-11-18 ENCOUNTER — Ambulatory Visit (INDEPENDENT_AMBULATORY_CARE_PROVIDER_SITE_OTHER): Payer: Medicare Other | Admitting: Vascular Surgery

## 2016-11-18 ENCOUNTER — Encounter (INDEPENDENT_AMBULATORY_CARE_PROVIDER_SITE_OTHER): Payer: Self-pay | Admitting: Vascular Surgery

## 2016-11-18 ENCOUNTER — Ambulatory Visit (INDEPENDENT_AMBULATORY_CARE_PROVIDER_SITE_OTHER): Payer: Medicare Other

## 2016-11-18 VITALS — BP 140/70 | HR 65 | Resp 16 | Ht 74.0 in | Wt 216.0 lb

## 2016-11-18 DIAGNOSIS — E118 Type 2 diabetes mellitus with unspecified complications: Secondary | ICD-10-CM | POA: Diagnosis not present

## 2016-11-18 DIAGNOSIS — I714 Abdominal aortic aneurysm, without rupture, unspecified: Secondary | ICD-10-CM

## 2016-11-18 DIAGNOSIS — E785 Hyperlipidemia, unspecified: Secondary | ICD-10-CM | POA: Diagnosis not present

## 2016-12-28 NOTE — Progress Notes (Signed)
Subjective:    Patient ID: Blake [name], male    DOB: [birth year], 83 y.o.   MRN: [id number] Chief Complaint  Patient presents with  . Re-evaluation    4-6 month ultrasound   The patient presents for 6 month AAA follow up. He underwent an aortic duplex which was notable for an abdominal aortic aneurysm measuring 5.1cm AP x 4.74cm transverse (this is stable when compared to a 07/16/2016 CTA). He denies any symptoms such as back pain, pulsatile abdominal masses or thrombosis in his extremities.     Review of Systems  Constitutional: Negative.   HENT: Negative.   Eyes: Negative.   Respiratory: Negative.   Cardiovascular: Negative.   Gastrointestinal: Negative.   Endocrine: Negative.   Genitourinary: Negative.   Musculoskeletal: Negative.   Skin: Negative.   Allergic/Immunologic: Negative.   Neurological: Negative.   Hematological: Negative.   Psychiatric/Behavioral: Negative.        Objective:   Physical Exam  Constitutional: He is oriented to person, place, and time. He appears well-developed. No distress.  HENT:  Head: Normocephalic.  Eyes: Conjunctivae are normal. Pupils are equal, round, and reactive to light.  Neck: Normal range of motion.  Cardiovascular: Normal rate, regular rhythm, normal heart sounds and intact distal pulses.   Pulses:      Radial pulses are 2+ on the right side, and 2+ on the left side.  Pulmonary/Chest: Effort normal.  Musculoskeletal: Normal range of motion. He exhibits edema (Mild bilateral edema noted).  Neurological: He is alert and oriented to person, place, and time.  Skin: Skin is warm and dry. He is not diaphoretic.  Psychiatric: He has a normal mood and affect. His behavior is normal. Judgment and thought content normal.  Vitals reviewed.   BP 140/70 (BP Location: Left Arm)   Pulse 65   Resp 16   Ht 6\' 2"  (1.88 m)   Wt 216 lb (98 kg)   BMI 27.73 kg/m   Past Medical History:  Diagnosis Date  . AAA (abdominal aortic  aneurysm) ([place])   . Arrhythmia   . Diabetes mellitus without complication ([place])   . GI bleed   . Heart murmur   . Hyperlipidemia   . Hypertension   . Prostate cancer [facility])     Social History   Social History  . Marital status: Married    Spouse name: N/A  . Number of children: N/A  . Years of education: N/A   Occupational History  . Not on file.   Social History Main Topics  . Smoking status: Former Research scientist (life sciences)  . Smokeless tobacco: Never Used  . Alcohol use No  . Drug use: No  . Sexual activity: Not on file   Other Topics Concern  . Not on file   Social History Narrative  . No narrative on file    Past Surgical History:  Procedure Laterality Date  . FLEXIBLE SIGMOIDOSCOPY N/A 10/21/2016   Procedure: FLEXIBLE SIGMOIDOSCOPY;  Surgeon: [name], MD;  Location: [facility] ENDOSCOPY;  Service: Endoscopy;  Laterality: N/A;  . JOINT REPLACEMENT    . PACEMAKER INSERTION Left 04/14/2015   Procedure: INSERTION PACEMAKER;  Surgeon: Isaias Cowman, MD;  Location: [facility] ORS;  Service: Cardiovascular;  Laterality: Left;  . PROSTATE SURGERY    . VISCERAL ARTERY INTERVENTION N/A 10/22/2016   Procedure: Visceral Artery Intervention;  Surgeon: Algernon Huxley, MD;  Location: [facility];  Service: Cardiovascular;  Laterality: N/A;    Family History  Problem Relation Age  of Onset  . Hypertension Other   . Stroke Other   . Heart attack Other   . Stroke Sister   . Heart attack Brother     Allergies  Allergen Reactions  . Diovan [Valsartan] Cough  . Hydralazine Itching  . Lisinopril Cough       Assessment & Plan:  The patient presents for 6 month AAA follow up. He underwent an aortic duplex which was notable for an abdominal aortic aneurysm measuring 5.1cm AP x 4.74cm transverse (this is stable when compared to a 07/16/2016 CTA). He denies any symptoms such as back pain, pulsatile abdominal masses or thrombosis in his extremities.   1. AAA (abdominal aortic aneurysm)  without rupture (HCC) - Stable Studies reviewed with patient. Duplex stable, physical exam unremarkable.  There has been no significant change when compared to the CTA completed in January 2018. The patient / his family are uncomfortable moving forward with any repair until the aneurysm grows. No surgery or intervention at this time. The patient to follow up in 6 months with an aortic duplex. The patient's blood pressure is being adequately controlled however I have reviewed the importance of hypertension and lipid control and the importance of continuing his abstinence from tobacco.  The patient is also encouraged to exercise a minimum of 30 minutes 4 times a week.  Should the patient develop new onset abdominal or back pain or signs of peripheral embolization they are instructed to seek medical attention immediately and to alert the physician providing care that they have an aneurysm.  The patient voices their understanding.  - VAS [place] AAA DUPLEX; Future  2. Type 2 diabetes mellitus with complication, unspecified whether long term insulin use (HCC) - stable Encouraged good control as its slows the progression of atherosclerotic disease  3. Hyperlipidemia, unspecified hyperlipidemia type - stable Encouraged good control as its slows the progression of atherosclerotic disease   Current Outpatient Prescriptions on File Prior to Visit  Medication Sig Dispense Refill  . amLODipine (NORVASC) 5 MG tablet Take 5 mg by mouth daily.    Marland Kitchen azelastine (ASTELIN) 0.1 % nasal spray Place 1 spray into both nostrils 2 (two) times daily. Use in each nostril as directed    . finasteride (PROSCAR) 5 MG tablet Take 5 mg by mouth daily.    Marland Kitchen glimepiride (AMARYL) 2 MG tablet 2 tablets in the morning and one tablet at night (Patient taking differently: 4 mg. ) 90 tablet 11  . metoprolol tartrate (LOPRESSOR) 25 MG tablet Take 0.5 tablets (12.5 mg total) by mouth 2 (two) times daily. 30 tablet 2  . Multiple  Vitamins-Minerals (PRESERVISION/LUTEIN PO) Take 1 capsule by mouth 2 (two) times daily.    . ranitidine (ZANTAC) 150 MG tablet Take 150 mg by mouth 2 (two) times daily.    . rosuvastatin (CRESTOR) 5 MG tablet Take 5 mg by mouth at bedtime.    . sitaGLIPtin (JANUVIA) 50 MG tablet Take 50 mg by mouth daily.    . tamsulosin (FLOMAX) 0.4 MG CAPS capsule Take 0.4 mg by mouth at bedtime.    . traMADol (ULTRAM) 50 MG tablet Take by mouth every 6 (six) hours as needed.    . triamcinolone cream (KENALOG) 0.5 % Apply 1 application topically 2 (two) times daily.     No current facility-administered medications on file prior to visit.     There are no Patient Instructions on file for this visit. No Follow-up on file.   Reubin Bushnell A Zenobia Kuennen, PA-C

## 2017-03-31 ENCOUNTER — Inpatient Hospital Stay
Admission: EM | Admit: 2017-03-31 | Discharge: 2017-04-03 | DRG: 178 | Disposition: A | Discharge status: Home Health | Payer: Medicare Other | Attending: Internal Medicine | Admitting: Internal Medicine

## 2017-03-31 ENCOUNTER — Encounter: Payer: Self-pay | Admitting: Medical Oncology

## 2017-03-31 ENCOUNTER — Emergency Department: Payer: Medicare Other

## 2017-03-31 ENCOUNTER — Other Ambulatory Visit: Payer: Self-pay

## 2017-03-31 DIAGNOSIS — Z888 Allergy status to other drugs, medicaments and biological substances status: Secondary | ICD-10-CM | POA: Diagnosis not present

## 2017-03-31 DIAGNOSIS — D696 Thrombocytopenia, unspecified: Secondary | ICD-10-CM | POA: Diagnosis present

## 2017-03-31 DIAGNOSIS — Z7984 Long term (current) use of oral hypoglycemic drugs: Secondary | ICD-10-CM

## 2017-03-31 DIAGNOSIS — Z8546 Personal history of malignant neoplasm of prostate: Secondary | ICD-10-CM

## 2017-03-31 DIAGNOSIS — E785 Hyperlipidemia, unspecified: Secondary | ICD-10-CM | POA: Diagnosis present

## 2017-03-31 DIAGNOSIS — I10 Essential (primary) hypertension: Secondary | ICD-10-CM | POA: Diagnosis present

## 2017-03-31 DIAGNOSIS — N179 Acute kidney failure, unspecified: Secondary | ICD-10-CM | POA: Diagnosis present

## 2017-03-31 DIAGNOSIS — J189 Pneumonia, unspecified organism: Secondary | ICD-10-CM

## 2017-03-31 DIAGNOSIS — Z79899 Other long term (current) drug therapy: Secondary | ICD-10-CM

## 2017-03-31 DIAGNOSIS — Z87891 Personal history of nicotine dependence: Secondary | ICD-10-CM | POA: Diagnosis not present

## 2017-03-31 DIAGNOSIS — I482 Chronic atrial fibrillation: Secondary | ICD-10-CM | POA: Diagnosis present

## 2017-03-31 DIAGNOSIS — J690 Pneumonitis due to inhalation of food and vomit: Principal | ICD-10-CM | POA: Diagnosis present

## 2017-03-31 DIAGNOSIS — E11649 Type 2 diabetes mellitus with hypoglycemia without coma: Secondary | ICD-10-CM | POA: Diagnosis present

## 2017-03-31 DIAGNOSIS — Z8249 Family history of ischemic heart disease and other diseases of the circulatory system: Secondary | ICD-10-CM | POA: Diagnosis not present

## 2017-03-31 DIAGNOSIS — Z23 Encounter for immunization: Secondary | ICD-10-CM | POA: Diagnosis present

## 2017-03-31 LAB — CBC
HEMATOCRIT: 32 % — AB (ref 40.0–52.0)
Hemoglobin: 10.6 g/dL — ABNORMAL LOW (ref 13.0–18.0)
MCH: 27 pg (ref 26.0–34.0)
MCHC: 33 g/dL (ref 32.0–36.0)
MCV: 81.7 fL (ref 80.0–100.0)
Platelets: 124 10*3/uL — ABNORMAL LOW (ref 150–440)
RBC: 3.91 MIL/uL — ABNORMAL LOW (ref 4.40–5.90)
RDW: 16.8 % — AB (ref 11.5–14.5)
WBC: 16 10*3/uL — ABNORMAL HIGH (ref 3.8–10.6)

## 2017-03-31 LAB — BASIC METABOLIC PANEL
Anion gap: 11 (ref 5–15)
BUN: 41 mg/dL — AB (ref 6–20)
CHLORIDE: 105 mmol/L (ref 101–111)
CO2: 22 mmol/L (ref 22–32)
Calcium: 8.9 mg/dL (ref 8.9–10.3)
Creatinine, Ser: 2.3 mg/dL — ABNORMAL HIGH (ref 0.61–1.24)
GFR calc Af Amer: 27 mL/min — ABNORMAL LOW (ref >60)
GFR, EST NON AFRICAN AMERICAN: 23 mL/min — AB (ref >60)
GLUCOSE: 179 mg/dL — AB (ref 65–99)
POTASSIUM: 4.8 mmol/L (ref 3.5–5.1)
Sodium: 138 mmol/L (ref 135–145)

## 2017-03-31 LAB — GLUCOSE, CAPILLARY
GLUCOSE-CAPILLARY: 117 mg/dL — AB (ref 65–99)
GLUCOSE-CAPILLARY: 88 mg/dL (ref 65–99)
Glucose-Capillary: 137 mg/dL — ABNORMAL HIGH (ref 65–99)

## 2017-03-31 LAB — URINALYSIS, COMPLETE (UACMP) WITH MICROSCOPIC
Bilirubin Urine: NEGATIVE
GLUCOSE, UA: NEGATIVE mg/dL
Ketones, ur: NEGATIVE mg/dL
Nitrite: NEGATIVE
PROTEIN: NEGATIVE mg/dL
SPECIFIC GRAVITY, URINE: 1.015 (ref 1.005–1.030)
pH: 5 (ref 5.0–8.0)

## 2017-03-31 LAB — LACTIC ACID, PLASMA
Lactic Acid, Venous: 3.1 mmol/L (ref 0.5–1.9)
Lactic Acid, Venous: 2.4 mmol/L (ref 0.5–1.9)

## 2017-03-31 MED ORDER — TAMSULOSIN HCL 0.4 MG PO CAPS
0.4000 mg | ORAL_CAPSULE | Freq: Every day | ORAL | Status: DC
  Administered 2017-03-31 – 2017-04-02 (×3): 0.4 mg via ORAL
  Filled 2017-03-31 (×3): qty 1

## 2017-03-31 MED ORDER — SODIUM CHLORIDE 0.9 % IV SOLN
INTRAVENOUS | Status: DC
  Administered 2017-03-31: 14:00:00 via INTRAVENOUS

## 2017-03-31 MED ORDER — LINAGLIPTIN 5 MG PO TABS
5.0000 mg | ORAL_TABLET | Freq: Every day | ORAL | Status: DC
  Filled 2017-03-31: qty 1

## 2017-03-31 MED ORDER — CEFTRIAXONE SODIUM IN DEXTROSE 20 MG/ML IV SOLN
1.0000 g | Freq: Once | INTRAVENOUS | Status: AC
  Administered 2017-03-31: 1 g via INTRAVENOUS
  Filled 2017-03-31: qty 50

## 2017-03-31 MED ORDER — FINASTERIDE 5 MG PO TABS
5.0000 mg | ORAL_TABLET | Freq: Every day | ORAL | Status: DC
Start: 2017-04-01 — End: 2017-04-03
  Administered 2017-04-01 – 2017-04-03 (×3): 5 mg via ORAL
  Filled 2017-03-31 (×3): qty 1

## 2017-03-31 MED ORDER — CEFTRIAXONE SODIUM IN DEXTROSE 20 MG/ML IV SOLN: 1.0000 g | INTRAVENOUS | Status: DC

## 2017-03-31 MED ORDER — DEXTROSE 5 % IV SOLN
1.0000 g | INTRAVENOUS | Status: DC
  Filled 2017-03-31: qty 10

## 2017-03-31 MED ORDER — METOPROLOL TARTRATE 25 MG PO TABS
12.5000 mg | ORAL_TABLET | Freq: Two times a day (BID) | ORAL | Status: DC
  Administered 2017-03-31 – 2017-04-03 (×6): 12.5 mg via ORAL
  Filled 2017-03-31 (×6): qty 1

## 2017-03-31 MED ORDER — AZELASTINE HCL 0.1 % NA SOLN
1.0000 | Freq: Two times a day (BID) | NASAL | Status: DC
  Administered 2017-03-31 – 2017-04-03 (×7): 1 via NASAL
  Filled 2017-03-31: qty 30

## 2017-03-31 MED ORDER — INSULIN ASPART 100 UNIT/ML ~~LOC~~ SOLN
0.0000 [IU] | Freq: Three times a day (TID) | SUBCUTANEOUS | Status: DC
  Administered 2017-03-31 – 2017-04-01 (×3): 1 [IU] via SUBCUTANEOUS
  Administered 2017-04-02: 2 [IU] via SUBCUTANEOUS
  Administered 2017-04-02: 3 [IU] via SUBCUTANEOUS
  Administered 2017-04-03: 09:00:00 1 [IU] via SUBCUTANEOUS
  Administered 2017-04-03: 12:00:00 3 [IU] via SUBCUTANEOUS
  Filled 2017-03-31 (×7): qty 1

## 2017-03-31 MED ORDER — DEXTROSE 5 % IV SOLN
500.0000 mg | INTRAVENOUS | Status: DC
  Filled 2017-03-31: qty 500

## 2017-03-31 MED ORDER — DEXTROSE 5 % IV SOLN
500.0000 mg | Freq: Once | INTRAVENOUS | Status: AC
  Administered 2017-03-31: 500 mg via INTRAVENOUS
  Filled 2017-03-31: qty 500

## 2017-03-31 MED ORDER — OCUVITE-LUTEIN PO CAPS
1.0000 | ORAL_CAPSULE | Freq: Two times a day (BID) | ORAL | Status: DC
  Administered 2017-03-31 – 2017-04-03 (×6): 1 via ORAL
  Filled 2017-03-31 (×7): qty 1

## 2017-03-31 MED ORDER — TRIAMCINOLONE ACETONIDE 0.5 % EX CREA
1.0000 "application " | TOPICAL_CREAM | Freq: Two times a day (BID) | CUTANEOUS | Status: DC
  Administered 2017-03-31 – 2017-04-03 (×7): 1 via TOPICAL
  Filled 2017-03-31: qty 15

## 2017-03-31 MED ORDER — ENOXAPARIN SODIUM 30 MG/0.3ML ~~LOC~~ SOLN
30.0000 mg | SUBCUTANEOUS | Status: DC
  Administered 2017-03-31 – 2017-04-01 (×2): 30 mg via SUBCUTANEOUS
  Filled 2017-03-31 (×2): qty 0.3

## 2017-03-31 MED ORDER — ROSUVASTATIN CALCIUM 10 MG PO TABS
5.0000 mg | ORAL_TABLET | Freq: Every day | ORAL | Status: DC
  Administered 2017-03-31 – 2017-04-02 (×3): 5 mg via ORAL
  Filled 2017-03-31 (×3): qty 1

## 2017-03-31 MED ORDER — INFLUENZA VAC SPLIT HIGH-DOSE 0.5 ML IM SUSY
0.5000 mL | PREFILLED_SYRINGE | INTRAMUSCULAR | Status: DC
  Filled 2017-03-31: qty 0.5

## 2017-03-31 MED ORDER — GLIMEPIRIDE 2 MG PO TABS
4.0000 mg | ORAL_TABLET | Freq: Two times a day (BID) | ORAL | Status: DC
  Administered 2017-03-31: 22:00:00 4 mg via ORAL
  Filled 2017-03-31: qty 2
  Filled 2017-03-31: qty 1

## 2017-03-31 MED ORDER — SODIUM CHLORIDE 0.9 % IV BOLUS (SEPSIS)
1000.0000 mL | Freq: Once | INTRAVENOUS | Status: AC
  Administered 2017-03-31: 1000 mL via INTRAVENOUS

## 2017-03-31 MED ORDER — INSULIN ASPART 100 UNIT/ML ~~LOC~~ SOLN: 0.0000 [IU] | Freq: Every day | SUBCUTANEOUS | Status: DC

## 2017-03-31 MED ORDER — FAMOTIDINE 20 MG PO TABS
20.0000 mg | ORAL_TABLET | Freq: Every day | ORAL | Status: DC
  Administered 2017-04-01 – 2017-04-03 (×3): 20 mg via ORAL
  Filled 2017-03-31 (×3): qty 1

## 2017-03-31 NOTE — ED Provider Notes (Signed)
[facility] Emergency Department Provider Note   ____________________________________________    I have reviewed the triage vital signs and the nursing notes.   HISTORY  Chief Complaint Shortness of Breath and Weakness     HPI Blake [name] is a 83 y.o. male Who presents with complaints of cough shortness of breath and fatigue. Patient reports he started feeling this way yesterday. He reports productive cough over the last week. Denies fevers or chills. No recent travel. No calf pain or swelling. No recent admission. Felt nauseated last night. Has not taken anything for this.   Past Medical History:  Diagnosis Date  . AAA (abdominal aortic aneurysm) ([place])   . Arrhythmia   . Diabetes mellitus without complication ([place])   . GI bleed   . Heart murmur   . Hyperlipidemia   . Hypertension   . Prostate cancer [facility])     Patient Active Problem List   Diagnosis Date Noted  . Blood in stool   . Hemorrhage of rectum and anus   . Lower GI bleed 10/20/2016  . Near syncope   . Acute renal failure superimposed on chronic kidney disease ([place])   . Pneumonia 10/09/2016  . Aspiration pneumonia ([place]) 10/09/2016  . Acute respiratory failure ([place]) 10/09/2016  . AAA (abdominal aortic aneurysm) without rupture ([place]) 04/27/2016  . Arthritis, degenerative 03/31/2016  . A-fib ([place]) 03/31/2016  . HLD (hyperlipidemia) 03/31/2016  . CA of prostate ([place]) 03/31/2016  . SVT (supraventricular tachycardia) ([place]) 04/09/2015  . Diabetes mellitus, type 2 ([place]) 04/09/2015  . Benign hypertension 04/09/2015  . Acute myocardial infarction, initial episode of care [facility]) 04/09/2015    Past Surgical History:  Procedure Laterality Date  . FLEXIBLE SIGMOIDOSCOPY N/A 10/21/2016   Procedure: FLEXIBLE SIGMOIDOSCOPY;  Surgeon: [name], MD;  Location: [facility] ENDOSCOPY;  Service: Endoscopy;  Laterality: N/A;  . JOINT REPLACEMENT    . PACEMAKER INSERTION Left 04/14/2015   Procedure: INSERTION PACEMAKER;  Surgeon: Isaias Cowman, MD;  Location: [facility] ORS;  Service: Cardiovascular;  Laterality: Left;  . PROSTATE SURGERY    . VISCERAL ARTERY INTERVENTION N/A 10/22/2016   Procedure: Visceral Artery Intervention;  Surgeon: Algernon Huxley, MD;  Location: [place] CV LAB;  Service: Cardiovascular;  Laterality: N/A;    Prior to Admission medications   Medication Sig Start Date End Date Taking? Authorizing Provider  amLODipine (NORVASC) 5 MG tablet Take 5 mg by mouth daily.   Yes [provider]  azelastine (ASTELIN) 0.1 % nasal spray Place 1 spray into both nostrils 2 (two) times daily. Use in each nostril as directed   Yes [provider]  finasteride (PROSCAR) 5 MG tablet Take 5 mg by mouth daily.   Yes [provider]  glimepiride (AMARYL) 2 MG tablet 2 tablets in the morning and one tablet at night Patient taking differently: Take 4 mg by mouth 2 (two) times daily.  04/15/15  Yes [name], MD  metoprolol tartrate (LOPRESSOR) 25 MG tablet Take 0.5 tablets (12.5 mg total) by mouth 2 (two) times daily. 10/13/16  Yes Demetrios Loll, MD  Multiple Vitamins-Minerals (PRESERVISION/LUTEIN PO) Take 1 capsule by mouth 2 (two) times daily.   Yes [provider]  ranitidine (ZANTAC) 150 MG tablet Take 150 mg by mouth 2 (two) times daily.   Yes [provider]  rosuvastatin (CRESTOR) 5 MG tablet Take 5 mg by mouth at bedtime.   Yes [provider]  sitaGLIPtin (JANUVIA) 50 MG tablet  Take 50 mg by mouth daily.   Yes [provider]  tamsulosin (FLOMAX) 0.4 MG CAPS capsule Take 0.4 mg by mouth at bedtime.   Yes [provider]  triamcinolone cream (KENALOG) 0.5 % Apply 1 application topically 2 (two) times daily.   Yes [provider]     Allergies Diovan [valsartan]; Hydralazine; and Lisinopril  Family History  Problem Relation Age of Onset  . Hypertension Other   . Stroke Other   .  Heart attack Other   . Stroke Sister   . Heart attack Brother     Social History Social History  Substance Use Topics  . Smoking status: Former Research scientist (life sciences)  . Smokeless tobacco: Never Used  . Alcohol use No    Review of Systems  Constitutional: No fever Eyes: No visual changes.  ENT: No sore throat. Cardiovascular: Denies chest pain. Respiratory:shortness of breath and cough Gastrointestinal: No abdominal pain.  Nausea last night Genitourinary: Negative for dysuria. Musculoskeletal: Negative for back pain. Skin: Negative for rash. Neurological: Negative for headaches    ____________________________________________   PHYSICAL EXAM:  VITAL SIGNS: ED Triage Vitals [03/31/17 1001]  Enc Vitals Group     BP 114/62     Pulse Rate 88     Resp 20     Temp 98.1 F (36.7 C)     Temp Source Oral     SpO2 93 %     Weight 99.8 kg (220 lb)     Height 1.88 m (6\' 2" )     Head Circumference      Peak Flow      Pain Score      Pain Loc      Pain Edu?      Excl. in [place]?     Constitutional: Alert.. No acute distress. Pleasant and interactive Eyes: Conjunctivae are normal.   Nose: No congestion/rhinnorhea. Mouth/Throat: Mucous membranes are moist.    Cardiovascular: Normal rate, regular rhythm. Grossly normal heart sounds.  Good peripheral circulation. Respiratory: Normal respiratory effort.  No retractions.bibasilar rales, scattered mild wheezes Gastrointestinal: Soft and nontender. No distention.  No CVA tenderness. Genitourinary: deferred Musculoskeletal: No lower extremity tenderness nor edema.  Warm and well perfused Neurologic:  Normal speech and language. No gross focal neurologic deficits are appreciated.  Skin:  Skin is warm, dry and intact. No rash noted. Psychiatric: Mood and affect are normal. Speech and behavior are normal.  ____________________________________________   LABS (all labs ordered are listed, but only abnormal results are displayed)  Labs Reviewed   BASIC METABOLIC PANEL - Abnormal; Notable for the following:       Result Value   Glucose, Bld 179 (*)    BUN 41 (*)    Creatinine, Ser 2.30 (*)    GFR calc non Af Amer 23 (*)    GFR calc Af Amer 27 (*)    All other components within normal limits  CBC - Abnormal; Notable for the following:    WBC 16.0 (*)    RBC 3.91 (*)    Hemoglobin 10.6 (*)    HCT 32.0 (*)    RDW 16.8 (*)    Platelets 124 (*)    All other components within normal limits  CULTURE, BLOOD (ROUTINE X 2)  CULTURE, BLOOD (ROUTINE X 2)  URINALYSIS, COMPLETE (UACMP) WITH MICROSCOPIC  LACTIC ACID, PLASMA  LACTIC ACID, PLASMA  CBG MONITORING, ED   ____________________________________________  EKG  ED ECG REPORT I, Lavonia Drafts, the attending physician, personally viewed  and interpreted this ECG.  Date: 03/31/2017  Rhythm: normal sinus rhythm QRS Axis: normal Intervals: right bundle branch block ST/T Wave abnormalities: normal   ____________________________________________  RADIOLOGY  Chest x-ray shows multilobar pneumonia ____________________________________________   PROCEDURES  Procedure(s) performed: No    Critical Care performed: No ____________________________________________   INITIAL IMPRESSION / ASSESSMENT AND PLAN / ED COURSE  Pertinent labs & imaging results that were available during my care of the patient were reviewed by me and considered in my medical decision making (see chart for details).  Patient presents with shortness of breath weakness and cough. Initial vitals did not meet sepsis criteria. Lab work is significant for elevated white blood cell count, likely related to probable pneumonia on chest x-ray, episode of nausea last night and son reports that he did vomit, aspiration pneumonia is a concern as well. I will cover with Rocephin and azithromycin and admitted to the hospitalist service    ____________________________________________   FINAL CLINICAL  IMPRESSION(S) / ED DIAGNOSES  Final diagnoses:  Community acquired pneumonia, unspecified laterality      NEW MEDICATIONS STARTED DURING THIS VISIT:  New Prescriptions   No medications on file     Note:  This document was prepared using Dragon voice recognition software and may include unintentional dictation errors.    Lavonia Drafts, MD 03/31/17 [phone]

## 2017-03-31 NOTE — Progress Notes (Signed)
Pharmacy Antibiotic Note  Blake [name] is a 83 y.o. male admitted on 03/31/2017 with pneumonia.  Pharmacy has been consulted for azithromycin and ceftriaxone dosing.  Plan: Will initiate ceftriaxone 1g IV Q24hr and azithromycin 500mg  IV Q24hr.    Height: 6\' 2"  (188 cm) Weight: 220 lb (99.8 kg) IBW/kg (Calculated) : 82.2  Temp (24hrs), Avg:98.1 F (36.7 C), Min:98.1 F (36.7 C), Max:98.1 F (36.7 C)   Recent Labs Lab 03/31/17 1013  WBC 16.0*  CREATININE 2.30*    Estimated Creatinine Clearance: 25.9 mL/min (A) (by C-G formula based on SCr of 2.3 mg/dL (H)).    Allergies  Allergen Reactions  . Diovan [Valsartan] Cough  . Hydralazine Itching  . Lisinopril Cough    Antimicrobials this admission: Azithromycin 9/20 >>  Ceftriaxone 9/20 >>   Dose adjustments this admission: N/A  Microbiology results: N/A  Thank you for allowing pharmacy to be a part of this patient's care.  Gianlucas Evenson L 03/31/2017 11:42 AM

## 2017-03-31 NOTE — ED Notes (Signed)
Pt transported upstairs by Di Kindle, EDT.

## 2017-03-31 NOTE — H&P (Signed)
[place] [facility] [place] NAME: Blake [name]    MR#:  [id number]  DATE OF BIRTH:  1922/10/26  DATE OF ADMISSION:  03/31/2017  PRIMARY CARE PHYSICIAN: Adin Hector, MD   REQUESTING/REFERRING PHYSICIAN: Corky Downs  CHIEF COMPLAINT:  Shortness of breath  HISTORY OF PRESENT ILLNESS:  Blake [name]  is a 83 y.o. male with a known history of  Diabetes mellitus, hypertension, hyponatremia and multiple other medical problems is presenting to the ED with a chief complaint of cough and shortness of breath. Patient reports he has been coughing from last night and bringing up yellow phlegm. Chest x-ray has revealed multilobar pneumonia. Lactic acid is elevated and white count is elevated. Patient is started on IV Rocephin and azithromycin  PAST MEDICAL HISTORY:   Past Medical History:  Diagnosis Date  . AAA (abdominal aortic aneurysm) ([place])   . Arrhythmia   . Diabetes mellitus without complication ([place])   . GI bleed   . Heart murmur   . Hyperlipidemia   . Hypertension   . Prostate cancer ([place])     PAST SURGICAL HISTOIRY:   Past Surgical History:  Procedure Laterality Date  . FLEXIBLE SIGMOIDOSCOPY N/A 10/21/2016   Procedure: FLEXIBLE SIGMOIDOSCOPY;  Surgeon: [name], MD;  Location: [facility] ENDOSCOPY;  Service: Endoscopy;  Laterality: N/A;  . JOINT REPLACEMENT    . PACEMAKER INSERTION Left 04/14/2015   Procedure: INSERTION PACEMAKER;  Surgeon: Isaias Cowman, MD;  Location: [facility] ORS;  Service: Cardiovascular;  Laterality: Left;  . PROSTATE SURGERY    . VISCERAL ARTERY INTERVENTION N/A 10/22/2016   Procedure: Visceral Artery Intervention;  Surgeon: Algernon Huxley, MD;  Location: [place] CV LAB;  Service: Cardiovascular;  Laterality: N/A;    SOCIAL HISTORY:   Social History  Substance Use Topics  . Smoking status: Former Research scientist (life sciences)  . Smokeless tobacco: Never Used  . Alcohol use No    FAMILY HISTORY:   Family History  Problem  Relation Age of Onset  . Hypertension Other   . Stroke Other   . Heart attack Other   . Stroke Sister   . Heart attack Brother     DRUG ALLERGIES:   Allergies  Allergen Reactions  . Diovan [Valsartan] Cough  . Hydralazine Itching  . Lisinopril Cough    REVIEW OF SYSTEMS:  CONSTITUTIONAL: No fever, fatigue or weakness.  EYES: No blurred or double vision.  EARS, NOSE, AND THROAT: No tinnitus or ear pain.  RESPIRATORY: Reporting productive cough, shortness of breath, denies wheezing or hemoptysis.  CARDIOVASCULAR: No chest pain, orthopnea, edema.  GASTROINTESTINAL: No nausea, vomiting, diarrhea or abdominal pain.  GENITOURINARY: No dysuria, hematuria.  ENDOCRINE: No polyuria, nocturia,  HEMATOLOGY: No anemia, easy bruising or bleeding SKIN: No rash or lesion. MUSCULOSKELETAL: No joint pain or arthritis.   NEUROLOGIC: No tingling, numbness, weakness.  PSYCHIATRY: No anxiety or depression.   MEDICATIONS AT HOME:   Prior to Admission medications   Medication Sig Start Date End Date Taking? Authorizing Provider  amLODipine (NORVASC) 5 MG tablet Take 5 mg by mouth daily.   Yes [provider]  azelastine (ASTELIN) 0.1 % nasal spray Place 1 spray into both nostrils 2 (two) times daily. Use in each nostril as directed   Yes [provider]  finasteride (PROSCAR) 5 MG tablet Take 5 mg by mouth daily.   Yes [provider]  glimepiride (AMARYL) 2 MG tablet 2 tablets in the morning and one tablet at  night Patient taking differently: Take 4 mg by mouth 2 (two) times daily.  04/15/15  Yes [name], MD  metoprolol tartrate (LOPRESSOR) 25 MG tablet Take 0.5 tablets (12.5 mg total) by mouth 2 (two) times daily. 10/13/16  Yes Demetrios Loll, MD  Multiple Vitamins-Minerals (PRESERVISION/LUTEIN PO) Take 1 capsule by mouth 2 (two) times daily.   Yes [provider]  ranitidine (ZANTAC) 150 MG tablet Take 150 mg by mouth 2 (two) times daily.   Yes [provider]  rosuvastatin (CRESTOR) 5 MG tablet Take 5 mg by mouth at bedtime.   Yes [provider]  sitaGLIPtin (JANUVIA) 50 MG tablet Take 50 mg by mouth daily.   Yes [provider]  tamsulosin (FLOMAX) 0.4 MG CAPS capsule Take 0.4 mg by mouth at bedtime.   Yes [provider]  triamcinolone cream (KENALOG) 0.5 % Apply 1 application topically 2 (two) times daily.   Yes [provider]      VITAL SIGNS:  Blood pressure 114/62, pulse 88, temperature 98.1 F (36.7 C), temperature source Oral, resp. rate 20, height 6\' 2"  (1.88 m), weight 99.8 kg (220 lb), SpO2 93 %.  PHYSICAL EXAMINATION:  GENERAL:  83 y.o.-year-old patient lying in the bed with no acute distress.  EYES: Pupils equal, round, reactive to light and accommodation. No scleral icterus. Extraocular muscles intact.  HEENT: Head atraumatic, normocephalic. Oropharynx and nasopharynx clear.  NECK:  Supple, no jugular venous distention. No thyroid enlargement, no tenderness.  LUNGS: Moderate breath sounds bilaterally, no wheezing, rales,rhonchi ; positive crackles and crepitation. No use of accessory muscles of respiration.  CARDIOVASCULAR: S1, S2 normal. No murmurs, rubs, or gallops.  ABDOMEN: Soft, nontender, nondistended. Bowel sounds present. No organomegaly or mass.  EXTREMITIES: No pedal edema, cyanosis, or clubbing.  NEUROLOGIC: Cranial nerves II through XII are intact. Muscle strength 5/5 in all extremities. Sensation intact. Gait not checked.  PSYCHIATRIC: The patient is alert and oriented x 3.  SKIN: No obvious rash, lesion, or ulcer.   LABORATORY PANEL:   CBC  Recent Labs Lab 03/31/17 1013  WBC 16.0*  HGB 10.6*  HCT 32.0*  PLT 124*   ------------------------------------------------------------------------------------------------------------------  Chemistries   Recent Labs Lab 03/31/17 1013  NA 138  K 4.8  CL 105  CO2 22  GLUCOSE 179*  BUN 41*  CREATININE 2.30*   CALCIUM 8.9   ------------------------------------------------------------------------------------------------------------------  Cardiac Enzymes No results for input(s): TROPONINI in the last 168 hours. ------------------------------------------------------------------------------------------------------------------  RADIOLOGY:  Dg Chest 2 View  Result Date: 03/31/2017 CLINICAL DATA:  Cough for several weeks. EXAM: CHEST  2 VIEW COMPARISON:  10/22/2016 FINDINGS: There is hyperinflation of the lungs compatible with COPD. Left pacer in place, unchanged. New airspace opacities in both lower lobes, left greater than right concerning for pneumonia. Heart is normal size. Aortic calcifications noted. No effusions. IMPRESSION: COPD. Increasing bibasilar airspace opacities, left greater than right concerning for pneumonia. Electronically Signed   By: Rolm Baptise M.D.   On: 03/31/2017 10:31    EKG:   Orders placed or performed during the hospital encounter of 03/31/17  . ED EKG  . ED EKG    IMPRESSION AND PLAN:   Blake [name]  is a 83 y.o. male with a known history of  Diabetes mellitus, hypertension, hyponatremia and multiple other medical problems is presenting to the ED with a chief complaint of cough and shortness of breath. Patient reports he has been coughing from last night and bringing up yellow  phlegm. Chest x-ray has revealed multilobar pneumonia. Lactic acid is elevated and white count is elevated. Patient is started on IV Rocephin and azithromycin  #Shortness of breath from community acquired pneumonia-multilobar Admit to MedSurg unit IV Rocephin and azithromycin Check urine for Legionella antigen and streptococcal antigen Sputum culture and sensitivity  #Chronic atrial fibrillation Rate controlled. Continue Lopressor  #Hypertension Continue home medication amlodipine and metoprolol  #Diabetes mellitus continue Januvia and Amaryl. Check hemoglobin A1c and sliding scale  insulin   DVT prophylaxis with Lovenox subcutaneous      All the records are reviewed and case discussed with ED provider. Management plans discussed with the patient, family and they are in agreement.  CODE STATUS: fc, son is HCPOA  TOTAL TIME TAKING CARE OF THIS PATIENT: 45  minutes.   Note: This dictation was prepared with Dragon dictation along with smaller phrase technology. Any transcriptional errors that result from this process are unintentional.  Nicholes Mango M.D on 03/31/2017 at 12:35 PM  Between 7am to 6pm - Pager - [phone]  After 6pm go to [email or web address] - password [email or web address] [facility]  [facility]  Office  [phone]  CC: Primary care physician; Adin Hector, MD

## 2017-03-31 NOTE — ED Notes (Signed)
Date and time results received: 03/31/17  12:45 PM   Test: Lactic Critical Value: 3.1  Name of Provider Notified: Dr. Margaretmary Eddy  Orders Received? Or Actions Taken?: Orders Received - See Orders for details

## 2017-03-31 NOTE — ED Triage Notes (Signed)
Pt from [facility] with reports from son that pt has had a cough for several weeks and pt has been reporting sob with exertion. Pt denies pain. Reports he "just feels weak". Pt A/O x 4.

## 2017-04-01 LAB — BLOOD CULTURE ID PANEL (REFLEXED)
ACINETOBACTER BAUMANNII: NOT DETECTED
CANDIDA ALBICANS: NOT DETECTED
CANDIDA GLABRATA: NOT DETECTED
CANDIDA KRUSEI: NOT DETECTED
Candida parapsilosis: NOT DETECTED
Candida tropicalis: NOT DETECTED
ENTEROBACTER CLOACAE COMPLEX: NOT DETECTED
ENTEROCOCCUS SPECIES: NOT DETECTED
ESCHERICHIA COLI: NOT DETECTED
Enterobacteriaceae species: NOT DETECTED
Haemophilus influenzae: NOT DETECTED
KLEBSIELLA OXYTOCA: NOT DETECTED
Klebsiella pneumoniae: NOT DETECTED
LISTERIA MONOCYTOGENES: NOT DETECTED
Methicillin resistance: DETECTED — AB
Neisseria meningitidis: NOT DETECTED
PSEUDOMONAS AERUGINOSA: NOT DETECTED
Proteus species: NOT DETECTED
STREPTOCOCCUS AGALACTIAE: NOT DETECTED
STREPTOCOCCUS PNEUMONIAE: NOT DETECTED
STREPTOCOCCUS PYOGENES: NOT DETECTED
Serratia marcescens: NOT DETECTED
Staphylococcus aureus (BCID): NOT DETECTED
Staphylococcus species: DETECTED — AB
Streptococcus species: NOT DETECTED

## 2017-04-01 LAB — HEMOGLOBIN A1C
Hgb A1c MFr Bld: 6.9 % — ABNORMAL HIGH (ref 4.8–5.6)
Mean Plasma Glucose: 151.33 mg/dL

## 2017-04-01 LAB — GLUCOSE, CAPILLARY
GLUCOSE-CAPILLARY: 124 mg/dL — AB (ref 65–99)
GLUCOSE-CAPILLARY: 176 mg/dL — AB (ref 65–99)
Glucose-Capillary: 45 mg/dL — ABNORMAL LOW (ref 65–99)
Glucose-Capillary: 71 mg/dL (ref 65–99)
Glucose-Capillary: 146 mg/dL — ABNORMAL HIGH (ref 65–99)

## 2017-04-01 LAB — STREP PNEUMONIAE URINARY ANTIGEN: Strep Pneumo Urinary Antigen: NEGATIVE

## 2017-04-01 LAB — HIV ANTIBODY (ROUTINE TESTING W REFLEX): HIV Screen 4th Generation wRfx: NONREACTIVE

## 2017-04-01 MED ORDER — AZITHROMYCIN 500 MG PO TABS
250.0000 mg | ORAL_TABLET | Freq: Every day | ORAL | Status: DC
  Administered 2017-04-01 – 2017-04-03 (×3): 250 mg via ORAL
  Filled 2017-04-01 (×3): qty 1

## 2017-04-01 MED ORDER — INFLUENZA VAC SPLIT HIGH-DOSE 0.5 ML IM SUSY
0.5000 mL | PREFILLED_SYRINGE | INTRAMUSCULAR | Status: AC
  Administered 2017-04-02: 10:00:00 0.5 mL via INTRAMUSCULAR
  Filled 2017-04-01: qty 0.5

## 2017-04-01 MED ORDER — SODIUM CHLORIDE 0.9 % IV SOLN
3.0000 g | Freq: Two times a day (BID) | INTRAVENOUS | Status: DC
  Administered 2017-04-01 – 2017-04-02 (×3): 3 g via INTRAVENOUS
  Filled 2017-04-01 (×4): qty 3

## 2017-04-01 NOTE — Progress Notes (Signed)
PHARMACY - PHYSICIAN COMMUNICATION CRITICAL VALUE ALERT - BLOOD CULTURE IDENTIFICATION (BCID)  Results for orders placed or performed during the hospital encounter of 03/31/17  Blood Culture ID Panel (Reflexed) (Collected: 03/31/2017 11:30 AM)  Result Value Ref Range   Enterococcus species NOT DETECTED NOT DETECTED   Listeria monocytogenes NOT DETECTED NOT DETECTED   Staphylococcus species DETECTED (A) NOT DETECTED   Staphylococcus aureus NOT DETECTED NOT DETECTED   Methicillin resistance DETECTED (A) NOT DETECTED   Streptococcus species NOT DETECTED NOT DETECTED   Streptococcus agalactiae NOT DETECTED NOT DETECTED   Streptococcus pneumoniae NOT DETECTED NOT DETECTED   Streptococcus pyogenes NOT DETECTED NOT DETECTED   Acinetobacter baumannii NOT DETECTED NOT DETECTED   Enterobacteriaceae species NOT DETECTED NOT DETECTED   Enterobacter cloacae complex NOT DETECTED NOT DETECTED   Escherichia coli NOT DETECTED NOT DETECTED   Klebsiella oxytoca NOT DETECTED NOT DETECTED   Klebsiella pneumoniae NOT DETECTED NOT DETECTED   Proteus species NOT DETECTED NOT DETECTED   Serratia marcescens NOT DETECTED NOT DETECTED   Haemophilus influenzae NOT DETECTED NOT DETECTED   Neisseria meningitidis NOT DETECTED NOT DETECTED   Pseudomonas aeruginosa NOT DETECTED NOT DETECTED   Candida albicans NOT DETECTED NOT DETECTED   Candida glabrata NOT DETECTED NOT DETECTED   Candida krusei NOT DETECTED NOT DETECTED   Candida parapsilosis NOT DETECTED NOT DETECTED   Candida tropicalis NOT DETECTED NOT DETECTED  Call from Lab - 1 of 4 bottles (aerobic) - Gram + cocci, Staph species, Mech A detected  Name of physician (or Provider) Contacted: Dr Dustin Flock  Changes to prescribed antibiotics required: None  Blake [name] 04/01/2017  3:13 PM

## 2017-04-01 NOTE — Care Management (Signed)
Admitted from home from ED after being sent from his PCP office.  Diagnosed with multilobular pneumonia. Current oxygen requirements acute.  has had [place] in the past. Current with PCP and no issues with transportation, obtaining meds or accessing medical care.

## 2017-04-01 NOTE — Progress Notes (Signed)
Pharmacy Antibiotic Note  Blake [name] is a 83 y.o. male admitted on 03/31/2017 with pneumonia.  Pharmacy has been consulted for azithromycin and ceftriaxone dosing.  Plan: Will continue ceftriaxone 1g IV Q24hr and azithromycin 500mg  IV Q24hr.    Height: 6\' 2"  (188 cm) Weight: 223 lb 7 oz (101.4 kg) IBW/kg (Calculated) : 82.2  Temp (24hrs), Avg:98.3 F (36.8 C), Min:98 F (36.7 C), Max:98.6 F (37 C)   Recent Labs Lab 03/31/17 1013 03/31/17 1130 03/31/17 1504  WBC 16.0*  --   --   CREATININE 2.30*  --   --   LATICACIDVEN  --  3.1* 2.4*    Estimated Creatinine Clearance: 26.1 mL/min (A) (by C-G formula based on SCr of 2.3 mg/dL (H)).    Allergies  Allergen Reactions  . Diovan [Valsartan] Cough  . Hydralazine Itching  . Lisinopril Cough    Antimicrobials this admission: Azithromycin 9/20 >>  Ceftriaxone 9/20 >>   Dose adjustments this admission: N/A  Microbiology results: BCx x4 NGTD Sputum cx ordered Legionella and strep ur Ag ordered   Thank you for allowing pharmacy to be a part of this patient's care.  Rocky Morel 04/01/2017 9:07 AM

## 2017-04-01 NOTE — Progress Notes (Signed)
[place] at [facility]                                                                                                                                                                                  Patient Demographics   Blake [name], is a 83 y.o. male, DOB - 1923/04/08, MOQ:[id number]  Admit date - 03/31/2017   Admitting Physician Nicholes Mango, MD  Outpatient Primary MD for the patient is Adin Hector, MD   LOS - 1  Subjective: Patient admitted with pneumonia. According to patient and family had a episode where he become very nauseous and had a episode of projectile vomiting. As subsequently developed shortness of breath. He has not had any swallowing trouble. Patient had a similar presentation a few months prior. He is feeling better.   Review of Systems:   CONSTITUTIONAL: No documented fever. No fatigue, weakness. No weight gain, no weight loss.  EYES: No blurry or double vision.  ENT: No tinnitus. No postnasal drip. No redness of the oropharynx.  RESPIRATORY: No cough, no wheeze, no hemoptysis. No dyspnea.  CARDIOVASCULAR: No chest pain. No orthopnea. No palpitations. No syncope.  GASTROINTESTINAL: No nausea, no vomiting or diarrhea. No abdominal pain. No melena or hematochezia.  GENITOURINARY: No dysuria or hematuria.  ENDOCRINE: No polyuria or nocturia. No heat or cold intolerance.  HEMATOLOGY: No anemia. No bruising. No bleeding.  INTEGUMENTARY: No rashes. No lesions.  MUSCULOSKELETAL: No arthritis. No swelling. No gout.  NEUROLOGIC: No numbness, tingling, or ataxia. No seizure-type activity.  PSYCHIATRIC: No anxiety. No insomnia. No ADD.    Vitals:   Vitals:   03/31/17 1935 03/31/17 2151 04/01/17 0441 04/01/17 0919  BP: 140/65 132/71 (!) 137/55   Pulse: 74 81 73   Resp: 18 20 20    Temp: 98.6 F (37 C)  98 F (36.7 C)   TempSrc: Oral     SpO2: 96% 97% 98% 92%  Weight:      Height:        Wt Readings from Last 3 Encounters:   03/31/17 223 lb 7 oz (101.4 kg)  11/18/16 216 lb (98 kg)  11/09/16 215 lb (97.5 kg)     Intake/Output Summary (Last 24 hours) at 04/01/17 1309 Last data filed at 04/01/17 1226  Gross per 24 hour  Intake          1306.25 ml  Output              300 ml  Net          1006.25 ml    Physical Exam:   GENERAL: Pleasant-appearing in no apparent distress.  HEAD, EYES, EARS, NOSE  AND THROAT: Atraumatic, normocephalic. Extraocular muscles are intact. Pupils equal and reactive to light. Sclerae anicteric. No conjunctival injection. No oro-pharyngeal erythema.  NECK: Supple. There is no jugular venous distention. No bruits, no lymphadenopathy, no thyromegaly.  HEART: Regular rate and rhythm,. No murmurs, no rubs, no clicks.  LUNGS: Rhonchus breath sounds bilaterally without any necessary muscle usage  ABDOMEN: Soft, flat, nontender, nondistended. Has good bowel sounds. No hepatosplenomegaly appreciated.  EXTREMITIES: No evidence of any cyanosis, clubbing, or peripheral edema.  +2 pedal and radial pulses bilaterally.  NEUROLOGIC: The patient is alert, awake, and oriented x3 with no focal motor or sensory deficits appreciated bilaterally.  SKIN: Moist and warm with no rashes appreciated.  Psych: Not anxious, depressed LN: No inguinal LN enlargement    Antibiotics   Anti-infectives    Start     Dose/Rate Route Frequency Ordered Stop   04/01/17 1000  cefTRIAXone (ROCEPHIN) 1 g in dextrose 5 % 50 mL IVPB - Premix  Status:  Discontinued     1 g 100 mL/hr over 30 Minutes Intravenous Every 24 hours 03/31/17 1141 03/31/17 1320   04/01/17 1000  azithromycin (ZITHROMAX) 500 mg in dextrose 5 % 250 mL IVPB  Status:  Discontinued     500 mg 250 mL/hr over 60 Minutes Intravenous Every 24 hours 03/31/17 1141 04/01/17 0936   04/01/17 1000  cefTRIAXone (ROCEPHIN) 1 g in dextrose 5 % 50 mL IVPB  Status:  Discontinued     1 g 120 mL/hr over 30 Minutes Intravenous Every 24 hours 03/31/17 1320 04/01/17 0936    04/01/17 1000  azithromycin (ZITHROMAX) tablet 250 mg     250 mg Oral Daily 04/01/17 0936     04/01/17 1000  Ampicillin-Sulbactam (UNASYN) 3 g in sodium chloride 0.9 % 100 mL IVPB     3 g 200 mL/hr over 30 Minutes Intravenous Every 12 hours 04/01/17 0936     03/31/17 1130  cefTRIAXone (ROCEPHIN) 1 g in dextrose 5 % 50 mL IVPB - Premix     1 g 100 mL/hr over 30 Minutes Intravenous  Once 03/31/17 1120 03/31/17 1220   03/31/17 1130  azithromycin (ZITHROMAX) 500 mg in dextrose 5 % 250 mL IVPB     500 mg 250 mL/hr over 60 Minutes Intravenous  Once 03/31/17 1120 03/31/17 1405      Medications   Scheduled Meds: . azelastine  1 spray Each Nare BID  . azithromycin  250 mg Oral Daily  . enoxaparin (LOVENOX) injection  30 mg Subcutaneous Q24H  . famotidine  20 mg Oral Daily  . finasteride  5 mg Oral Daily  . Influenza vac split quadrivalent PF  0.5 mL Intramuscular Tomorrow-1000  . insulin aspart  0-5 Units Subcutaneous QHS  . insulin aspart  0-9 Units Subcutaneous TID WC  . metoprolol tartrate  12.5 mg Oral BID  . multivitamin-lutein  1 capsule Oral BID  . rosuvastatin  5 mg Oral QHS  . tamsulosin  0.4 mg Oral QHS  . triamcinolone cream  1 application Topical BID   Continuous Infusions: . ampicillin-sulbactam (UNASYN) IV Stopped (04/01/17 1226)   PRN Meds:.   Data Review:   Micro Results Recent Results (from the past 240 hour(s))  Blood Culture (routine x 2)     Status: None (Preliminary result)   Collection Time: 03/31/17 11:30 AM  Result Value Ref Range Status   Specimen Description BLOOD BLOOD RIGHT FOREARM  Final   Special Requests   Final    BOTTLES  DRAWN AEROBIC AND ANAEROBIC Blood Culture adequate volume   Culture NO GROWTH < 24 HOURS  Final   Report Status PENDING  Incomplete  Blood Culture (routine x 2)     Status: None (Preliminary result)   Collection Time: 03/31/17 11:30 AM  Result Value Ref Range Status   Specimen Description BLOOD LEFT ANTECUBITAL  Final    Special Requests   Final    BOTTLES DRAWN AEROBIC AND ANAEROBIC Blood Culture adequate volume   Culture  Setup Time Organism ID to follow  Final   Culture NO GROWTH < 24 HOURS  Final   Report Status PENDING  Incomplete  Culture, blood (routine x 2) Call MD if unable to obtain prior to antibiotics being given     Status: None (Preliminary result)   Collection Time: 03/31/17  3:04 PM  Result Value Ref Range Status   Specimen Description BLOOD RIGHT ANTECUBITAL  Final   Special Requests   Final    BOTTLES DRAWN AEROBIC AND ANAEROBIC Blood Culture results may not be optimal due to an excessive volume of blood received in culture bottles   Culture NO GROWTH < 24 HOURS  Final   Report Status PENDING  Incomplete  Culture, blood (routine x 2) Call MD if unable to obtain prior to antibiotics being given     Status: None (Preliminary result)   Collection Time: 03/31/17  3:14 PM  Result Value Ref Range Status   Specimen Description BLOOD BLOOD LEFT HAND  Final   Special Requests   Final    BOTTLES DRAWN AEROBIC AND ANAEROBIC Blood Culture adequate volume   Culture NO GROWTH < 24 HOURS  Final   Report Status PENDING  Incomplete    Radiology Reports Dg Chest 2 View  Result Date: 03/31/2017 CLINICAL DATA:  Cough for several weeks. EXAM: CHEST  2 VIEW COMPARISON:  10/22/2016 FINDINGS: There is hyperinflation of the lungs compatible with COPD. Left pacer in place, unchanged. New airspace opacities in both lower lobes, left greater than right concerning for pneumonia. Heart is normal size. Aortic calcifications noted. No effusions. IMPRESSION: COPD. Increasing bibasilar airspace opacities, left greater than right concerning for pneumonia. Electronically Signed   By: Rolm Baptise M.D.   On: 03/31/2017 10:31     CBC  Recent Labs Lab 03/31/17 1013  WBC 16.0*  HGB 10.6*  HCT 32.0*  PLT 124*  MCV 81.7  MCH 27.0  MCHC 33.0  RDW 16.8*    Chemistries   Recent Labs Lab 03/31/17 1013  NA 138   K 4.8  CL 105  CO2 22  GLUCOSE 179*  BUN 41*  CREATININE 2.30*  CALCIUM 8.9   ------------------------------------------------------------------------------------------------------------------ estimated creatinine clearance is 26.1 mL/min (A) (by C-G formula based on SCr of 2.3 mg/dL (H)). ------------------------------------------------------------------------------------------------------------------  Recent Labs  04/01/17 0329  HGBA1C 6.9*   ------------------------------------------------------------------------------------------------------------------ No results for input(s): CHOL, HDL, LDLCALC, TRIG, CHOLHDL, LDLDIRECT in the last 72 hours. ------------------------------------------------------------------------------------------------------------------ No results for input(s): TSH, T4TOTAL, T3FREE, THYROIDAB in the last 72 hours.  Invalid input(s): FREET3 ------------------------------------------------------------------------------------------------------------------ No results for input(s): VITAMINB12, FOLATE, FERRITIN, TIBC, IRON, RETICCTPCT in the last 72 hours.  Coagulation profile No results for input(s): INR, PROTIME in the last 168 hours.  No results for input(s): DDIMER in the last 72 hours.  Cardiac Enzymes No results for input(s): CKMB, TROPONINI, MYOGLOBIN in the last 168 hours.  Invalid input(s): CK ------------------------------------------------------------------------------------------------------------------ Invalid input(s): [place]   Kalev Temme  is a  83 y.o. male with a known history of  Diabetes mellitus, hypertension, hyponatremia and multiple other medical problems is presenting to the ED with a chief complaint of cough and shortness of breath. Patient reports he has been coughing from last night and bringing up yellow phlegm. Chest x-ray has revealed multilobar pneumonia. Lactic acid is elevated and white count is  elevated.  # aspiration pneumonia after aspiration events We'll change antibiotics to Unasyn and azithromycin Wean off oxygen  #Chronic atrial fibrillation Rate controlled. Continue Lopressor  #Hypertension Continue home medication amlodipine and metoprolol blood pressure stable-   #Diabetes mellitus with hypoglycemia due to poor by mouth intake Discontinue his oral meds  DVT prophylaxis with Lovenox subcutaneous      Code Status Orders        Start     Ordered   03/31/17 1335  Full code  Continuous     03/31/17 1334    Code Status History    Date Active Date Inactive Code Status Order ID Comments User Context   10/21/2016  1:55 AM 10/25/2016  9:45 PM Full Code [id number]  [place] Inpatient   10/09/2016 11:42 PM 10/13/2016  8:09 PM Full Code [id number]  Vaughan Basta, MD Inpatient   04/14/2015  2:32 PM 04/15/2015  5:39 PM Full Code [id number]  Isaias Cowman, MD Inpatient   04/09/2015 12:22 AM 04/14/2015  2:32 PM Full Code [id number]  Hower, Aaron Mose, MD ED    Advance Directive Documentation     Most Recent Value  Type of Advance Directive  Healthcare Power of Agency [place], Living will  Pre-existing out of facility DNR order (yellow form or pink MOST form)  -  "MOST" Form in Place?  -           Consults  none  DVT Prophylaxis  Lovenox   Lab Results  Component Value Date   PLT 124 (L) 03/31/2017     Time Spent in minutes   30min  Greater than 50% of time spent in care coordination and counseling patient regarding the condition and plan of care.   Dustin Flock M.D on 04/01/2017 at 1:09 PM  Between 7am to 6pm - Pager - [phone]  After 6pm go to [email or web address] - password [email or web address]   Office  [phone]

## 2017-04-01 NOTE — Progress Notes (Signed)
Pharmacy Antibiotic Note  Blake [name] is a 83 y.o. male admitted on 03/31/2017 with pneumonia.  Pharmacy has been consulted for azithromycin and ceftriaxone dosing. Pt with aspiration event changing to Unasyn.  Plan: Will order Unasyn 3 g IV q12h based on current renal function.   Height: 6\' 2"  (188 cm) Weight: 223 lb 7 oz (101.4 kg) IBW/kg (Calculated) : 82.2  Temp (24hrs), Avg:98.3 F (36.8 C), Min:98 F (36.7 C), Max:98.6 F (37 C)   Recent Labs Lab 03/31/17 1013 03/31/17 1130 03/31/17 1504  WBC 16.0*  --   --   CREATININE 2.30*  --   --   LATICACIDVEN  --  3.1* 2.4*    Estimated Creatinine Clearance: 26.1 mL/min (A) (by C-G formula based on SCr of 2.3 mg/dL (H)).    Allergies  Allergen Reactions  . Diovan [Valsartan] Cough  . Hydralazine Itching  . Lisinopril Cough    Antimicrobials this admission: Azithromycin 9/20 >>  Ceftriaxone 9/20 >> 9/21 Unasyn 9/21 >>  Dose adjustments this admission: N/A  Microbiology results: BCx x4 NGTD Sputum cx ordered Legionella and strep ur Ag ordered   Thank you for allowing pharmacy to be a part of this patient's care.  Rocky Morel 04/01/2017 9:37 AM

## 2017-04-01 NOTE — Care Management Important Message (Addendum)
Important Message  Patient Details  Name: Blake [name] MRN: [id number] Date of Birth: [birth year]   Medicare Important Message Given:  Other (see comment) Provided patient with notice that was signed in the ED on admission. Did not receive the notice in the ED   Katrina Stack, RN 04/01/2017, 8:44 AM

## 2017-04-02 LAB — GLUCOSE, CAPILLARY
GLUCOSE-CAPILLARY: 232 mg/dL — AB (ref 65–99)
GLUCOSE-CAPILLARY: 166 mg/dL — AB (ref 65–99)
Glucose-Capillary: 80 mg/dL (ref 65–99)
Glucose-Capillary: 168 mg/dL — ABNORMAL HIGH (ref 65–99)

## 2017-04-02 LAB — BASIC METABOLIC PANEL
ANION GAP: 5 (ref 5–15)
BUN: 32 mg/dL — ABNORMAL HIGH (ref 6–20)
CHLORIDE: 107 mmol/L (ref 101–111)
CO2: 24 mmol/L (ref 22–32)
Calcium: 8.2 mg/dL — ABNORMAL LOW (ref 8.9–10.3)
Creatinine, Ser: 1.98 mg/dL — ABNORMAL HIGH (ref 0.61–1.24)
GFR calc Af Amer: 32 mL/min — ABNORMAL LOW (ref >60)
GFR calc non Af Amer: 28 mL/min — ABNORMAL LOW (ref >60)
Glucose, Bld: 96 mg/dL (ref 65–99)
Potassium: 4 mmol/L (ref 3.5–5.1)
Sodium: 136 mmol/L (ref 135–145)

## 2017-04-02 LAB — CBC
HEMATOCRIT: 27.1 % — AB (ref 40.0–52.0)
HEMOGLOBIN: 9.1 g/dL — AB (ref 13.0–18.0)
MCH: 27.5 pg (ref 26.0–34.0)
MCHC: 33.6 g/dL (ref 32.0–36.0)
MCV: 81.7 fL (ref 80.0–100.0)
Platelets: 91 10*3/uL — ABNORMAL LOW (ref 150–440)
RBC: 3.32 MIL/uL — ABNORMAL LOW (ref 4.40–5.90)
RDW: 17.2 % — ABNORMAL HIGH (ref 11.5–14.5)
WBC: 9.1 10*3/uL (ref 3.8–10.6)

## 2017-04-02 MED ORDER — AMPICILLIN-SULBACTAM SODIUM 3 (2-1) G IJ SOLR
3.0000 g | Freq: Four times a day (QID) | INTRAMUSCULAR | Status: DC
  Administered 2017-04-02 – 2017-04-03 (×4): 3 g via INTRAVENOUS
  Filled 2017-04-02 (×6): qty 3

## 2017-04-02 NOTE — Progress Notes (Signed)
Blake [name] at [facility]                                                                                                                                                                                  Patient Demographics   Blake [name], is a 83 y.o. male, DOB - [birth year], GGY:[id number]  Admit date - 03/31/2017   Admitting Physician Nicholes Mango, MD  Outpatient Primary MD for the patient is [name], MD   LOS - 2  Subjective: Pt breathing improved still coughing, oxygen dropped with activity   Review of Systems:   CONSTITUTIONAL: No documented fever. No fatigue, weakness. No weight gain, no weight loss.  EYES: No blurry or double vision.  ENT: No tinnitus. No postnasal drip. No redness of the oropharynx.  RESPIRATORY:  Positive cough, no wheeze, no hemoptysis. No dyspnea.  CARDIOVASCULAR: No chest pain. No orthopnea. No palpitations. No syncope.  GASTROINTESTINAL: No nausea, no vomiting or diarrhea. No abdominal pain. No melena or hematochezia.  GENITOURINARY: No dysuria or hematuria.  ENDOCRINE: No polyuria or nocturia. No heat or cold intolerance.  HEMATOLOGY: No anemia. No bruising. No bleeding.  INTEGUMENTARY: No rashes. No lesions.  MUSCULOSKELETAL: No arthritis. No swelling. No gout.  NEUROLOGIC: No numbness, tingling, or ataxia. No seizure-type activity.  PSYCHIATRIC: No anxiety. No insomnia. No ADD.    Vitals:   Vitals:   04/01/17 1731 04/01/17 2049 04/02/17 0447 04/02/17 1054  BP:  (!) 150/79 132/77   Pulse:  75 64 90  Resp:  18 (!) 23   Temp:  98.6 F (37 C) 99.5 F (37.5 C)   TempSrc:  Oral Oral   SpO2: 95% 93% 95% 92%  Weight:      Height:        Wt Readings from Last 3 Encounters:  03/31/17 223 lb 7 oz (101.4 kg)  11/18/16 216 lb (98 kg)  11/09/16 215 lb (97.5 kg)     Intake/Output Summary (Last 24 hours) at 04/02/17 1240 Last data filed at 04/02/17 1051  Gross per 24 hour  Intake           580.42 ml  Output               500 ml  Net            80.42 ml    Physical Exam:   GENERAL: Pleasant-appearing in no apparent distress.  HEAD, EYES, EARS, NOSE AND THROAT: Atraumatic, normocephalic. Extraocular muscles are intact. Pupils equal and reactive to light. Sclerae anicteric. No conjunctival injection. No oro-pharyngeal erythema.  NECK: Supple. There is no jugular venous distention. No bruits, no lymphadenopathy, no  thyromegaly.  HEART: Regular rate and rhythm,. No murmurs, no rubs, no clicks.  LUNGS: Rhonchus breath sounds bilaterally without any necessary muscle usage  ABDOMEN: Soft, flat, nontender, nondistended. Has good bowel sounds. No hepatosplenomegaly appreciated.  EXTREMITIES: No evidence of any cyanosis, clubbing, or peripheral edema.  +2 pedal and radial pulses bilaterally.  NEUROLOGIC: The patient is alert, awake, and oriented x3 with no focal motor or sensory deficits appreciated bilaterally.  SKIN: Moist and warm with no rashes appreciated.  Psych: Not anxious, depressed LN: No inguinal LN enlargement    Antibiotics   Anti-infectives    Start     Dose/Rate Route Frequency Ordered Stop   04/01/17 1000  cefTRIAXone (ROCEPHIN) 1 g in dextrose 5 % 50 mL IVPB - Premix  Status:  Discontinued     1 g 100 mL/hr over 30 Minutes Intravenous Every 24 hours 03/31/17 1141 03/31/17 1320   04/01/17 1000  azithromycin (ZITHROMAX) 500 mg in dextrose 5 % 250 mL IVPB  Status:  Discontinued     500 mg 250 mL/hr over 60 Minutes Intravenous Every 24 hours 03/31/17 1141 04/01/17 0936   04/01/17 1000  cefTRIAXone (ROCEPHIN) 1 g in dextrose 5 % 50 mL IVPB  Status:  Discontinued     1 g 120 mL/hr over 30 Minutes Intravenous Every 24 hours 03/31/17 1320 04/01/17 0936   04/01/17 1000  azithromycin (ZITHROMAX) tablet 250 mg     250 mg Oral Daily 04/01/17 0936     04/01/17 1000  Ampicillin-Sulbactam (UNASYN) 3 g in sodium chloride 0.9 % 100 mL IVPB     3 g 200 mL/hr over 30 Minutes Intravenous Every 12  hours 04/01/17 0936     03/31/17 1130  cefTRIAXone (ROCEPHIN) 1 g in dextrose 5 % 50 mL IVPB - Premix     1 g 100 mL/hr over 30 Minutes Intravenous  Once 03/31/17 1120 03/31/17 1220   03/31/17 1130  azithromycin (ZITHROMAX) 500 mg in dextrose 5 % 250 mL IVPB     500 mg 250 mL/hr over 60 Minutes Intravenous  Once 03/31/17 1120 03/31/17 1405      Medications   Scheduled Meds: . azelastine  1 spray Each Nare BID  . azithromycin  250 mg Oral Daily  . enoxaparin (LOVENOX) injection  30 mg Subcutaneous Q24H  . famotidine  20 mg Oral Daily  . finasteride  5 mg Oral Daily  . insulin aspart  0-5 Units Subcutaneous QHS  . insulin aspart  0-9 Units Subcutaneous TID WC  . metoprolol tartrate  12.5 mg Oral BID  . multivitamin-lutein  1 capsule Oral BID  . rosuvastatin  5 mg Oral QHS  . tamsulosin  0.4 mg Oral QHS  . triamcinolone cream  1 application Topical BID   Continuous Infusions: . ampicillin-sulbactam (UNASYN) IV Stopped (04/02/17 1025)   PRN Meds:.   Data Review:   Micro Results Recent Results (from the past 240 hour(s))  Blood Culture (routine x 2)     Status: None (Preliminary result)   Collection Time: 03/31/17 11:30 AM  Result Value Ref Range Status   Specimen Description BLOOD BLOOD RIGHT FOREARM  Final   Special Requests   Final    BOTTLES DRAWN AEROBIC AND ANAEROBIC Blood Culture adequate volume   Culture NO GROWTH 2 DAYS  Final   Report Status PENDING  Incomplete  Blood Culture (routine x 2)     Status: None (Preliminary result)   Collection Time: 03/31/17 11:30 AM  Result Value  Ref Range Status   Specimen Description BLOOD LEFT ANTECUBITAL  Final   Special Requests   Final    BOTTLES DRAWN AEROBIC AND ANAEROBIC Blood Culture adequate volume   Culture  Setup Time   Final    Organism ID to follow GRAM POSITIVE COCCI AEROBIC BOTTLE ONLY CRITICAL RESULT CALLED TO, READ BACK BY AND VERIFIED WITH: HANK ZOMPA ON 04/01/17 AT 1352 QSD    Culture   Final    GRAM  POSITIVE COCCI CULTURE REINCUBATED FOR BETTER GROWTH Performed at [facility], [place] [address] [place], [place] [address]    Report Status PENDING  Incomplete  Blood Culture ID Panel (Reflexed)     Status: Abnormal   Collection Time: 03/31/17 11:30 AM  Result Value Ref Range Status   Enterococcus species NOT DETECTED NOT DETECTED Final   Listeria monocytogenes NOT DETECTED NOT DETECTED Final   Staphylococcus species DETECTED (A) NOT DETECTED Final    Comment: Methicillin (oxacillin) resistant coagulase negative staphylococcus. Possible blood culture contaminant (unless isolated from more than one blood culture draw or clinical case suggests pathogenicity). No antibiotic treatment is indicated for blood  culture contaminants. CRITICAL RESULT CALLED TO, READ BACK BY AND VERIFIED WITH: HANK ZOMPA ON 04/01/17 AT 1352 QSD    Staphylococcus aureus NOT DETECTED NOT DETECTED Final   Methicillin resistance DETECTED (A) NOT DETECTED Final    Comment: CRITICAL RESULT CALLED TO, READ BACK BY AND VERIFIED WITH: HANK ZOMPA ON 04/01/17 AT 1352 QSD    Streptococcus species NOT DETECTED NOT DETECTED Final   Streptococcus agalactiae NOT DETECTED NOT DETECTED Final   Streptococcus pneumoniae NOT DETECTED NOT DETECTED Final   Streptococcus pyogenes NOT DETECTED NOT DETECTED Final   Acinetobacter baumannii NOT DETECTED NOT DETECTED Final   Enterobacteriaceae species NOT DETECTED NOT DETECTED Final   Enterobacter cloacae complex NOT DETECTED NOT DETECTED Final   Escherichia coli NOT DETECTED NOT DETECTED Final   Klebsiella oxytoca NOT DETECTED NOT DETECTED Final   Klebsiella pneumoniae NOT DETECTED NOT DETECTED Final   Proteus species NOT DETECTED NOT DETECTED Final   Serratia marcescens NOT DETECTED NOT DETECTED Final   Haemophilus influenzae NOT DETECTED NOT DETECTED Final   Neisseria meningitidis NOT DETECTED NOT DETECTED Final   Pseudomonas aeruginosa NOT DETECTED NOT DETECTED Final   Candida  albicans NOT DETECTED NOT DETECTED Final   Candida glabrata NOT DETECTED NOT DETECTED Final   Candida krusei NOT DETECTED NOT DETECTED Final   Candida parapsilosis NOT DETECTED NOT DETECTED Final   Candida tropicalis NOT DETECTED NOT DETECTED Final  Culture, blood (routine x 2) Call MD if unable to obtain prior to antibiotics being given     Status: None (Preliminary result)   Collection Time: 03/31/17  3:04 PM  Result Value Ref Range Status   Specimen Description BLOOD RIGHT ANTECUBITAL  Final   Special Requests   Final    BOTTLES DRAWN AEROBIC AND ANAEROBIC Blood Culture results may not be optimal due to an excessive volume of blood received in culture bottles   Culture NO GROWTH 2 DAYS  Final   Report Status PENDING  Incomplete  Culture, blood (routine x 2) Call MD if unable to obtain prior to antibiotics being given     Status: None (Preliminary result)   Collection Time: 03/31/17  3:14 PM  Result Value Ref Range Status   Specimen Description BLOOD BLOOD LEFT HAND  Final   Special Requests   Final    BOTTLES DRAWN AEROBIC AND  ANAEROBIC Blood Culture adequate volume   Culture NO GROWTH 2 DAYS  Final   Report Status PENDING  Incomplete    Radiology Reports Dg Chest 2 View  Result Date: 03/31/2017 CLINICAL DATA:  Cough for several weeks. EXAM: CHEST  2 VIEW COMPARISON:  10/22/2016 FINDINGS: There is hyperinflation of the lungs compatible with COPD. Left pacer in place, unchanged. New airspace opacities in both lower lobes, left greater than right concerning for pneumonia. Heart is normal size. Aortic calcifications noted. No effusions. IMPRESSION: COPD. Increasing bibasilar airspace opacities, left greater than right concerning for pneumonia. Electronically Signed   By: Rolm Baptise M.D.   On: 03/31/2017 10:31     CBC  Recent Labs Lab 03/31/17 1013 04/02/17 0414  WBC 16.0* 9.1  HGB 10.6* 9.1*  HCT 32.0* 27.1*  PLT 124* 91*  MCV 81.7 81.7  MCH 27.0 27.5  MCHC 33.0 33.6  RDW  16.8* 17.2*    Chemistries   Recent Labs Lab 03/31/17 1013 04/02/17 0414  NA 138 136  K 4.8 4.0  CL 105 107  CO2 22 24  GLUCOSE 179* 96  BUN 41* 32*  CREATININE 2.30* 1.98*  CALCIUM 8.9 8.2*   ------------------------------------------------------------------------------------------------------------------ estimated creatinine clearance is 30.3 mL/min (A) (by C-G formula based on SCr of 1.98 mg/dL (H)). ------------------------------------------------------------------------------------------------------------------  Recent Labs  04/01/17 0329  HGBA1C 6.9*   ------------------------------------------------------------------------------------------------------------------ No results for input(s): CHOL, HDL, LDLCALC, TRIG, CHOLHDL, LDLDIRECT in the last 72 hours. ------------------------------------------------------------------------------------------------------------------ No results for input(s): TSH, T4TOTAL, T3FREE, THYROIDAB in the last 72 hours.  Invalid input(s): FREET3 ------------------------------------------------------------------------------------------------------------------ No results for input(s): VITAMINB12, FOLATE, FERRITIN, TIBC, IRON, RETICCTPCT in the last 72 hours.  Coagulation profile No results for input(s): INR, PROTIME in the last 168 hours.  No results for input(s): DDIMER in the last 72 hours.  Cardiac Enzymes No results for input(s): CKMB, TROPONINI, MYOGLOBIN in the last 168 hours.  Invalid input(s): CK ------------------------------------------------------------------------------------------------------------------ Invalid input(s): [place]   Blake [name]  is a 83 y.o. male with a known history of  Diabetes mellitus, hypertension, hyponatremia and multiple other medical problems is presenting to the ED with a chief complaint of cough and shortness of breath. Patient reports he has been coughing from last night  and bringing up yellow phlegm. Chest x-ray has revealed multilobar pneumonia. Lactic acid is elevated and white count is elevated.  # aspiration pneumonia after aspiration events continue Unasyn and azithromycin Wean off oxygen Positive blood cultures felt to be due to contamination  #Chronic atrial fibrillation Rate controlled. Continue Lopressor  #Hypertension Continue home medication amlodipine and metoprolol blood pressure stable-   #Diabetes mellitus with hypoglycemia due to poor by mouth intake Discontinue his oral meds  #worsening thrombocytopenia discontinue Lovenox monitor platelet count  Expect discharge tomorrow     Code Status Orders        Start     Ordered   03/31/17 1335  Full code  Continuous     03/31/17 1334    Code Status History    Date Active Date Inactive Code Status Order ID Comments User Context   10/21/2016  1:55 AM 10/25/2016  9:45 PM Full Code [id number]  Alondra ParkUbaldo Glassing, DO Inpatient   10/09/2016 11:42 PM 10/13/2016  8:09 PM Full Code [id number]  Vaughan Basta, MD Inpatient   04/14/2015  2:32 PM 04/15/2015  5:39 PM Full Code [id number]  Isaias Cowman, MD Inpatient   04/09/2015 12:22 AM 04/14/2015  2:32 PM Full Code  [id number]  Hower, Aaron Mose, MD ED    Advance Directive Documentation     Most Recent Value  Type of Advance Directive  Healthcare Power of [place], Living will  Pre-existing out of facility DNR order (yellow form or pink MOST form)  -  "MOST" Form in Place?  -           Consults  none  DVT Prophylaxis  Lovenox   Lab Results  Component Value Date   PLT 91 (L) 04/02/2017     Time Spent in minutes   35min  Greater than 50% of time spent in care coordination and counseling patient regarding the condition and plan of care.   Dustin Flock M.D on 04/02/2017 at 12:40 PM  Between 7am to 6pm - Pager - [phone]  After 6pm go to [email or web address] - password [email or web address] [facility]   Office   [phone]

## 2017-04-02 NOTE — Progress Notes (Signed)
Pharmacy Antibiotic Note  Blake [name] is a 83 y.o. male admitted on 03/31/2017 with pneumonia.  Pharmacy has been consulted for ampicillin/sulbactam dosing.  Plan: Will increase dose to ampicillin/sulbactam 3 g IV q6h given improvement in renal function.  Height: 6\' 2"  (188 cm) Weight: 223 lb 7 oz (101.4 kg) IBW/kg (Calculated) : 82.2  Temp (24hrs), Avg:98.9 F (37.2 C), Min:98.6 F (37 C), Max:99.5 F (37.5 C)   Recent Labs Lab 03/31/17 1013 03/31/17 1130 03/31/17 1504 04/02/17 0414  WBC 16.0*  --   --  9.1  CREATININE 2.30*  --   --  1.98*  LATICACIDVEN  --  3.1* 2.4*  --     Estimated Creatinine Clearance: 30.3 mL/min (A) (by C-G formula based on SCr of 1.98 mg/dL (H)).    Allergies  Allergen Reactions  . Diovan [Valsartan] Cough  . Hydralazine Itching  . Lisinopril Cough    Antimicrobials this admission: Azithromycin 9/20 >>  Ceftriaxone 9/20 >> 9/21 Unasyn 9/21 >>  Microbiology results: BCx 1/4 staph species, mecA, pending Sputum cx sent  Thank you for allowing pharmacy to be a part of this patient's care.  Lenis Noon, PharmD Clinical Pharmacist 04/02/2017 1:04 PM

## 2017-04-02 NOTE — Plan of Care (Signed)
Problem: Safety: Goal: Ability to remain free from injury will improve Outcome: Progressing Pt oob to chair with rolling walker and stand by assist this shift

## 2017-04-02 NOTE — Evaluation (Signed)
Physical Therapy Evaluation Patient Details Name: Blake [name] MRN: [id number] DOB: [birth year] Today's Date: 04/02/2017   History of Present Illness  Pt is a 83 y.o. malewith a known history of Diabetes mellitus, hypertension, hyponatremia and multiple other medical problems is presenting to the ED with a chief complaint of cough and shortness of breath. Patient reports he has been coughing from last night and bringing up yellow phlegm.Chest x-ray has revealed multilobar pneumonia. Lactic acid is elevated and white count is elevated.    Clinical Impression  Pt presents with deficits in strength, transfers, mobility, gait, balance, and activity tolerance.  Pt SBA with extra time and effort required for all bed mobility tasks.  Pt CGA with transfers with significant cuing required for proper sequencing.  Pt able to amb 60' with RW and CGA with very slow cadence and flexed trunk posture with short B step length.  Flexed trunk posture did not improve with cues and resulted in significant WB through BUEs onto RW.  Pt uses rollator at baseline and pt and son educated on improved safety with RW compared to rollator secondary to pt's posture and UE WB through AD.  Pt's SpO2 at baseline 89-90% with HR in the upper 80s to low 90s.  After amb on room air SpO2 increased to 92% with HR in the high 90s to very low 100s, nursing aware.  Pt will benefit from HHPT services to safely address above deficits for decreased caregiver assistance upon discharge.      Follow Up Recommendations Home health PT    Equipment Recommendations  None recommended by PT    Recommendations for Other Services       Precautions / Restrictions Precautions Precautions: Fall Restrictions Weight Bearing Restrictions: No      Mobility  Bed Mobility Overal bed mobility: Needs Assistance Bed Mobility: Supine to Sit;Sit to Supine     Supine to sit: Supervision Sit to supine: Supervision   General bed mobility  comments: Extra time and effort required for bed mobility tasks  Transfers Overall transfer level: Needs assistance Equipment used: Rolling walker (2 wheeled) Transfers: Sit to/from Stand Sit to Stand: Min guard         General transfer comment: Mod verbal cues for hand placement and general sequencing  Ambulation/Gait Ambulation/Gait assistance: Min guard Ambulation Distance (Feet): 60 Feet Assistive device: Rolling walker (2 wheeled) Gait Pattern/deviations: Step-through pattern;Decreased step length - right;Decreased step length - left;Trunk flexed   Gait velocity interpretation: Below normal speed for age/gender General Gait Details: Significant trunk flex during gait that did not improve with cues with slow cadence and short B step length.  Pt and family educated on recommendation for RW use in place of rollator for safety and decreased fall risk secondary to pt's flexed trunk posture and resulting WB through BUEs onto AD during gait.  Stairs Stairs:  (Deferred)          Wheelchair Mobility    Modified Rankin (Stroke Patients Only)       Balance Overall balance assessment: Needs assistance Sitting-balance support: Feet supported;Feet unsupported;No upper extremity supported;Bilateral upper extremity supported Sitting balance-Leahy Scale: Good     Standing balance support: Bilateral upper extremity supported;During functional activity Standing balance-Leahy Scale: Fair                               Pertinent Vitals/Pain Pain Assessment: No/denies pain    Home Living Family/patient expects to be  discharged to:: Private residence Living Arrangements: Spouse/significant other;Children Available Help at Discharge: Family;Personal care attendant;Available 24 hours/day;Other (Comment) (Pt has caregivers 12 hrs/day and family home at night) Type of Home: House Home Access: Stairs to enter Entrance Stairs-Rails: Right;Left;Can reach both Entrance  Stairs-Number of Steps: 3 Home Layout: Multi-level;Able to live on main level with bedroom/bathroom Home Equipment: Walker - 4 wheels;Cane - single point;Wheelchair - [employer] - 2 wheels      Prior Function Level of Independence: Needs assistance   Gait / Transfers Assistance Needed: Mod I household distances with rollator, very slow step-to gait at baseline, no fall history  ADL's / Homemaking Assistance Needed: Pt has caregivers to assist with ADLs, family support with IADLs        Hand Dominance   Dominant Hand: Right    Extremity/Trunk Assessment   Upper Extremity Assessment Upper Extremity Assessment: Generalized weakness    Lower Extremity Assessment Lower Extremity Assessment: Generalized weakness       Communication   Communication: No difficulties  Cognition Arousal/Alertness: Awake/alert Behavior During Therapy: WFL for tasks assessed/performed Overall Cognitive Status: Within Functional Limits for tasks assessed                                        General Comments      Exercises Other Exercises Other Exercises: Pt and family education on physiological benefits of activity and principles of activity progression   Assessment/Plan    PT Assessment Patient needs continued PT services  PT Problem List Decreased strength;Decreased activity tolerance;Decreased balance;Decreased mobility;Decreased knowledge of use of DME       PT Treatment Interventions DME instruction;Gait training;Stair training;Functional mobility training;Neuromuscular re-education;Balance training;Therapeutic exercise;Therapeutic activities;Patient/family education    PT Goals (Current goals can be found in the Care Plan section)  Acute Rehab PT Goals Patient Stated Goal: To get stronger and return home PT Goal Formulation: With patient Time For Goal Achievement: 04/15/17 Potential to Achieve Goals: Good    Frequency Min 2X/week   Barriers to discharge         Co-evaluation               AM-PAC PT "6 Clicks" Daily Activity  Outcome Measure Difficulty turning over in bed (including adjusting bedclothes, sheets and blankets)?: A Little Difficulty moving from lying on back to sitting on the side of the bed? : A Little Difficulty sitting down on and standing up from a chair with arms (e.g., wheelchair, bedside commode, etc,.)?: Unable Help needed moving to and from a bed to chair (including a wheelchair)?: A Little Help needed walking in hospital room?: A Little Help needed climbing 3-5 steps with a railing? : A Little 6 Click Score: 16    End of Session Equipment Utilized During Treatment: Gait belt Activity Tolerance: Patient tolerated treatment well Patient left: in chair;with chair alarm set;with family/visitor present;with call bell/phone within reach;with nursing/sitter in room Nurse Communication: Mobility status;Other (comment) (SpO2 and HR with activity) PT Visit Diagnosis: Muscle weakness (generalized) (M62.81);Difficulty in walking, not elsewhere classified (R26.2)    Time: 6295-2841 PT Time Calculation (min) (ACUTE ONLY): 45 min   Charges:   PT Evaluation $PT Eval Low Complexity: 1 Low PT Treatments $Therapeutic Activity: 8-22 mins   PT G Codes:   PT G-Codes **NOT FOR INPATIENT CLASS** Functional Assessment Tool Used: AM-PAC 6 Clicks Basic Mobility Functional Limitation: Mobility: Walking and moving around Mobility:  Walking and Moving Around Current Status [phone]): At least 40 percent but less than 60 percent impaired, limited or restricted Mobility: Walking and Moving Around Goal Status [phone]): At least 1 percent but less than 20 percent impaired, limited or restricted    D. Scott Sharan Mcenaney PT, DPT 04/02/17, 11:07 AM

## 2017-04-03 LAB — CULTURE, BLOOD (ROUTINE X 2): SPECIAL REQUESTS: ADEQUATE

## 2017-04-03 LAB — GLUCOSE, CAPILLARY
GLUCOSE-CAPILLARY: 134 mg/dL — AB (ref 65–99)
Glucose-Capillary: 231 mg/dL — ABNORMAL HIGH (ref 65–99)

## 2017-04-03 LAB — CREATININE, SERUM
Creatinine, Ser: 2.19 mg/dL — ABNORMAL HIGH (ref 0.61–1.24)
GFR, EST AFRICAN AMERICAN: 28 mL/min — AB (ref >60)
GFR, EST NON AFRICAN AMERICAN: 24 mL/min — AB (ref >60)

## 2017-04-03 LAB — CBC
HEMATOCRIT: 29 % — AB (ref 40.0–52.0)
HEMOGLOBIN: 9.7 g/dL — AB (ref 13.0–18.0)
MCH: 27.4 pg (ref 26.0–34.0)
MCHC: 33.5 g/dL (ref 32.0–36.0)
MCV: 81.8 fL (ref 80.0–100.0)
Platelets: 103 10*3/uL — ABNORMAL LOW (ref 150–440)
RBC: 3.55 MIL/uL — AB (ref 4.40–5.90)
RDW: 16.8 % — AB (ref 11.5–14.5)
WBC: 8 10*3/uL (ref 3.8–10.6)

## 2017-04-03 LAB — LEGIONELLA PNEUMOPHILA SEROGP 1 UR AG: L. pneumophila Serogp 1 Ur Ag: NEGATIVE

## 2017-04-03 MED ORDER — AMOXICILLIN-POT CLAVULANATE 875-125 MG PO TABS: 1.0000 | ORAL_TABLET | Freq: Two times a day (BID) | ORAL | 0 refills | Status: AC

## 2017-04-03 MED ORDER — ACETAMINOPHEN 325 MG PO TABS
650.0000 mg | ORAL_TABLET | Freq: Four times a day (QID) | ORAL | Status: DC | PRN
  Administered 2017-04-03: 650 mg via ORAL
  Filled 2017-04-03: qty 2

## 2017-04-03 MED ORDER — GUAIFENESIN-DM 100-10 MG/5ML PO SYRP: 5.0000 mL | ORAL_SOLUTION | ORAL | 0 refills | Status: DC | PRN

## 2017-04-03 NOTE — Care Management Note (Signed)
Case Management Note  Patient Details  Name: Blake [name] MRN: [id number] Date of Birth: 11/10/1922  Subjective/Objective:     Discussed discharge planning with Mr Fross's daughter Brunetta Genera. Mr Russom has used [facility] in the past. Daughter Roslyn requested that [facility] at [facility] provide Mr Heiden's HH=PT. This request was called to Melene Muller at [facility] along with a referral for HH=PT, Aide. [facility] will try to accommodate Roslyn's request for a specific therapist if possible.   Action/Plan:   Expected Discharge Date:  04/03/17               Expected Discharge Plan:  [place]  In-House Referral:  NA  Discharge planning Services  CM Consult  Post Acute Care Choice:  NA Choice offered to:  Adult Children (Daughter Secondary school teacher)  DME Arranged:  N/A DME Agency:  NA  HH Arranged:  PT, Nurse's [employer] [employer] Agency:  [facility]  Status of Service:  Completed, signed off  If discussed at [place] of Stay Meetings, dates discussed:    Additional Comments:  Sweta Halseth A, RN 04/03/2017, 11:31 AM

## 2017-04-03 NOTE — Progress Notes (Signed)
MD order received to discharge pt home with home health today; Care Management previously established home health physical therapy services with [facility]; verbally reviewed AVS with pt and pt's son, Chance Munter III, no questions voiced at this time; pt discharged via wheelchair by nursing to the visitor's entrance

## 2017-04-03 NOTE — Progress Notes (Signed)
Spoke with Care Management, Jeani Hawking RN regarding discharge plans for [facility]; currently being reviewed by Care Management

## 2017-04-03 NOTE — Discharge Summary (Addendum)
[place] at [facility], 83 y.o., DOB 12/24/1922, MRN [id number]. Admission date: 03/31/2017 Discharge Date 04/03/2017 Primary MD Adin Hector, MD Admitting Physician Nicholes Mango, MD  Admission Diagnosis  Community acquired pneumonia, unspecified laterality [J18.9]  Discharge Diagnosis   Active Problems: aspiration  Pneumonia Chronic atrial fibrillation Essential hypertension diabetes type 2 History of GI bleed History of prostate cancer Hyperlipidemia unspecified     Hospital Course Blake [name]  is a 83 y.o. male with a known history of  Diabetes mellitus, hypertension, hyponatremia and multiple other medical problems is presenting to the ED with a chief complaint of cough and shortness of breath. Patient had an episode of projectile vomiting after which his symptoms started.he was seen in the emergency room and was noted to have pneumonia. He was admitted and treated for aspiration pneumonia. His symptoms have now improved his doing much better and is stable for discharge. He will have home health arranged. He was noted to have coag negative staph in his blood  which was felt to be due to contamination            Consults  None  Significant Tests:  See full reports for all details     Dg Chest 2 View  Result Date: 03/31/2017 CLINICAL DATA:  Cough for several weeks. EXAM: CHEST  2 VIEW COMPARISON:  10/22/2016 FINDINGS: There is hyperinflation of the lungs compatible with COPD. Left pacer in place, unchanged. New airspace opacities in both lower lobes, left greater than right concerning for pneumonia. Heart is normal size. Aortic calcifications noted. No effusions. IMPRESSION: COPD. Increasing bibasilar airspace opacities, left greater than right concerning for pneumonia. Electronically Signed   By: Rolm Baptise M.D.   On: 03/31/2017 10:31       Today   Subjective:   Blake [name]  Patient's breathing is much  improved  Objective:   Blood pressure 116/62, pulse (!) 56, temperature 98.5 F (36.9 C), temperature source Oral, resp. rate 18, height 6\' 2"  (1.88 m), weight 223 lb 7 oz (101.4 kg), SpO2 97 %.  .  Intake/Output Summary (Last 24 hours) at 04/03/17 1245 Last data filed at 04/03/17 0945  Gross per 24 hour  Intake              540 ml  Output              825 ml  Net             -285 ml    Exam VITAL SIGNS: Blood pressure 116/62, pulse (!) 56, temperature 98.5 F (36.9 C), temperature source Oral, resp. rate 18, height 6\' 2"  (1.88 m), weight 223 lb 7 oz (101.4 kg), SpO2 97 %.  GENERAL:  83 y.o.-year-old patient lying in the bed with no acute distress.  EYES: Pupils equal, round, reactive to light and accommodation. No scleral icterus. Extraocular muscles intact.  HEENT: Head atraumatic, normocephalic. Oropharynx and nasopharynx clear.  NECK:  Supple, no jugular venous distention. No thyroid enlargement, no tenderness.  LUNGS: Normal breath sounds bilaterally, no wheezing, rales,rhonchi or crepitation. No use of accessory muscles of respiration.  CARDIOVASCULAR: S1, S2 normal. No murmurs, rubs, or gallops.  ABDOMEN: Soft, nontender, nondistended. Bowel sounds present. No organomegaly or mass.  EXTREMITIES: No pedal edema, cyanosis, or clubbing.  NEUROLOGIC: Cranial nerves II through XII are intact. Muscle strength 5/5 in all extremities. Sensation intact. Gait not checked.  PSYCHIATRIC: The patient is alert and  oriented x 3.  SKIN: No obvious rash, lesion, or ulcer.   Data Review     CBC w Diff: Lab Results  Component Value Date   WBC 8.0 04/03/2017   HGB 9.7 (L) 04/03/2017   HGB 11.2 (L) 06/16/2014   HCT 29.0 (L) 04/03/2017   HCT 34.8 (L) 06/16/2014   PLT 103 (L) 04/03/2017   PLT 150 06/16/2014   LYMPHOPCT 16 04/08/2015   LYMPHOPCT 13.3 06/16/2014   MONOPCT 8 04/08/2015   MONOPCT 9.2 06/16/2014   EOSPCT 2 04/08/2015   EOSPCT 2.2 06/16/2014   BASOPCT 1 04/08/2015    BASOPCT 1.0 06/16/2014   CMP: Lab Results  Component Value Date   NA 136 04/02/2017   NA 137 06/16/2014   K 4.0 04/02/2017   K 4.3 06/16/2014   CL 107 04/02/2017   CL 100 06/16/2014   CO2 24 04/02/2017   CO2 27 06/16/2014   BUN 32 (H) 04/02/2017   BUN 29 (H) 06/16/2014   CREATININE 2.19 (H) 04/03/2017   CREATININE 1.48 (H) 06/16/2014   PROT 7.4 10/10/2016   PROT 7.9 12/29/2012   ALBUMIN 3.1 (L) 10/10/2016   ALBUMIN 4.0 12/29/2012   BILITOT 0.8 10/10/2016   BILITOT 0.4 12/29/2012   ALKPHOS 82 10/10/2016   ALKPHOS 77 12/29/2012   AST 20 10/10/2016   AST 20 12/29/2012   ALT 13 (L) 10/10/2016   ALT 16 12/29/2012  .  Micro Results Recent Results (from the past 240 hour(s))  Blood Culture (routine x 2)     Status: None (Preliminary result)   Collection Time: 03/31/17 11:30 AM  Result Value Ref Range Status   Specimen Description BLOOD BLOOD RIGHT FOREARM  Final   Special Requests   Final    BOTTLES DRAWN AEROBIC AND ANAEROBIC Blood Culture adequate volume   Culture NO GROWTH 3 DAYS  Final   Report Status PENDING  Incomplete  Blood Culture (routine x 2)     Status: Abnormal   Collection Time: 03/31/17 11:30 AM  Result Value Ref Range Status   Specimen Description BLOOD LEFT ANTECUBITAL  Final   Special Requests   Final    BOTTLES DRAWN AEROBIC AND ANAEROBIC Blood Culture adequate volume   Culture  Setup Time   Final    GRAM POSITIVE COCCI AEROBIC BOTTLE ONLY CRITICAL RESULT CALLED TO, READ BACK BY AND VERIFIED WITH: HANK ZOMPA ON 04/01/17 AT 1352 QSD    Culture (A)  Final    STAPHYLOCOCCUS SPECIES (COAGULASE NEGATIVE) THE SIGNIFICANCE OF ISOLATING THIS ORGANISM FROM A SINGLE SET OF BLOOD CULTURES WHEN MULTIPLE SETS ARE DRAWN IS UNCERTAIN. PLEASE NOTIFY THE MICROBIOLOGY DEPARTMENT WITHIN ONE WEEK IF SPECIATION AND SENSITIVITIES ARE REQUIRED. Performed at [facility], [place] [address], [place], [place] [address]    Report Status 04/03/2017 FINAL  Final  Blood  Culture ID Panel (Reflexed)     Status: Abnormal   Collection Time: 03/31/17 11:30 AM  Result Value Ref Range Status   Enterococcus species NOT DETECTED NOT DETECTED Final   Listeria monocytogenes NOT DETECTED NOT DETECTED Final   Staphylococcus species DETECTED (A) NOT DETECTED Final    Comment: Methicillin (oxacillin) resistant coagulase negative staphylococcus. Possible blood culture contaminant (unless isolated from more than one blood culture draw or clinical case suggests pathogenicity). No antibiotic treatment is indicated for blood  culture contaminants. CRITICAL RESULT CALLED TO, READ BACK BY AND VERIFIED WITH: HANK ZOMPA ON 04/01/17 AT 1352 QSD    Staphylococcus aureus NOT  DETECTED NOT DETECTED Final   Methicillin resistance DETECTED (A) NOT DETECTED Final    Comment: CRITICAL RESULT CALLED TO, READ BACK BY AND VERIFIED WITH: HANK ZOMPA ON 04/01/17 AT 1352 QSD    Streptococcus species NOT DETECTED NOT DETECTED Final   Streptococcus agalactiae NOT DETECTED NOT DETECTED Final   Streptococcus pneumoniae NOT DETECTED NOT DETECTED Final   Streptococcus pyogenes NOT DETECTED NOT DETECTED Final   Acinetobacter baumannii NOT DETECTED NOT DETECTED Final   Enterobacteriaceae species NOT DETECTED NOT DETECTED Final   Enterobacter cloacae complex NOT DETECTED NOT DETECTED Final   Escherichia coli NOT DETECTED NOT DETECTED Final   Klebsiella oxytoca NOT DETECTED NOT DETECTED Final   Klebsiella pneumoniae NOT DETECTED NOT DETECTED Final   Proteus species NOT DETECTED NOT DETECTED Final   Serratia marcescens NOT DETECTED NOT DETECTED Final   Haemophilus influenzae NOT DETECTED NOT DETECTED Final   Neisseria meningitidis NOT DETECTED NOT DETECTED Final   Pseudomonas aeruginosa NOT DETECTED NOT DETECTED Final   Candida albicans NOT DETECTED NOT DETECTED Final   Candida glabrata NOT DETECTED NOT DETECTED Final   Candida krusei NOT DETECTED NOT DETECTED Final   Candida parapsilosis NOT  DETECTED NOT DETECTED Final   Candida tropicalis NOT DETECTED NOT DETECTED Final  Culture, blood (routine x 2) Call MD if unable to obtain prior to antibiotics being given     Status: None (Preliminary result)   Collection Time: 03/31/17  3:04 PM  Result Value Ref Range Status   Specimen Description BLOOD RIGHT ANTECUBITAL  Final   Special Requests   Final    BOTTLES DRAWN AEROBIC AND ANAEROBIC Blood Culture results may not be optimal due to an excessive volume of blood received in culture bottles   Culture NO GROWTH 3 DAYS  Final   Report Status PENDING  Incomplete  Culture, blood (routine x 2) Call MD if unable to obtain prior to antibiotics being given     Status: None (Preliminary result)   Collection Time: 03/31/17  3:14 PM  Result Value Ref Range Status   Specimen Description BLOOD BLOOD LEFT HAND  Final   Special Requests   Final    BOTTLES DRAWN AEROBIC AND ANAEROBIC Blood Culture adequate volume   Culture NO GROWTH 3 DAYS  Final   Report Status PENDING  Incomplete        Code Status Orders        Start     Ordered   03/31/17 1335  Full code  Continuous     03/31/17 1334    Code Status History    Date Active Date Inactive Code Status Order ID Comments User Context   10/21/2016  1:55 AM 10/25/2016  9:45 PM Full Code [id number]  Harvie Bridge, DO Inpatient   10/09/2016 11:42 PM 10/13/2016  8:09 PM Full Code [id number]  Vaughan Basta, MD Inpatient   04/14/2015  2:32 PM 04/15/2015  5:39 PM Full Code [id number]  Isaias Cowman, MD Inpatient   04/09/2015 12:22 AM 04/14/2015  2:32 PM Full Code [id number]  Hower, Aaron Mose, MD ED    Advance Directive Documentation     Most Recent Value  Type of Advance Directive  Healthcare Power of Attorney, Living will  Pre-existing out of facility DNR order (yellow form or pink MOST form)  -  "MOST" Form in Place?  -          Follow-up Information    Adin Hector, MD. Schedule an appointment as soon as possible  for a  visit in 1 week.   Specialty:  Internal Medicine Why:  office closed on weekend Contact information: [address] [place] [address] [phone]           Discharge Medications   Allergies as of 04/03/2017      Reactions   Diovan [valsartan] Cough   Hydralazine Itching   Lisinopril Cough      Medication List    TAKE these medications   amLODipine 5 MG tablet Commonly known as:  NORVASC Take 5 mg by mouth daily.   amoxicillin-clavulanate 875-125 MG tablet Commonly known as:  AUGMENTIN Take 1 tablet by mouth 2 (two) times daily.   azelastine 0.1 % nasal spray Commonly known as:  ASTELIN Place 1 spray into both nostrils 2 (two) times daily. Use in each nostril as directed   finasteride 5 MG tablet Commonly known as:  PROSCAR Take 5 mg by mouth daily.   glimepiride 2 MG tablet Commonly known as:  AMARYL 2 tablets in the morning and one tablet at night What changed:  how much to take  how to take this  when to take this  additional instructions   guaiFENesin-dextromethorphan 100-10 MG/5ML syrup Commonly known as:  ROBITUSSIN DM Take 5 mLs by mouth every 4 (four) hours as needed for cough.   metoprolol tartrate 25 MG tablet Commonly known as:  LOPRESSOR Take 0.5 tablets (12.5 mg total) by mouth 2 (two) times daily.   PRESERVISION/LUTEIN PO Take 1 capsule by mouth 2 (two) times daily.   ranitidine 150 MG tablet Commonly known as:  ZANTAC Take 150 mg by mouth 2 (two) times daily.   rosuvastatin 5 MG tablet Commonly known as:  CRESTOR Take 5 mg by mouth at bedtime.   sitaGLIPtin 50 MG tablet Commonly known as:  JANUVIA Take 50 mg by mouth daily.   tamsulosin 0.4 MG Caps capsule Commonly known as:  FLOMAX Take 0.4 mg by mouth at bedtime.   triamcinolone cream 0.5 % Commonly known as:  KENALOG Apply 1 application topically 2 (two) times daily.            Discharge Care Instructions        Start      Ordered   04/03/17 0000  amoxicillin-clavulanate (AUGMENTIN) 875-125 MG tablet  2 times daily     04/03/17 0827   04/03/17 0000  guaiFENesin-dextromethorphan (ROBITUSSIN DM) 100-10 MG/5ML syrup  Every 4 hours PRN     04/03/17 0827         Total Time in preparing paper work, data evaluation and todays exam - 35 minutes  Dustin Flock M.D on 04/03/2017 at 12:45 PM  [facility]   Office  [phone]

## 2017-04-03 NOTE — Discharge Instructions (Addendum)
[place] at [place]:  Diabetic diet  DISCHARGE CONDITION:  Stable  ACTIVITY:  Activity as tolerated  OXYGEN:  Home Oxygen: No.   Oxygen Delivery: room air  DISCHARGE LOCATION:  home    ADDITIONAL DISCHARGE INSTRUCTION:   If you experience worsening of your admission symptoms, develop shortness of breath, life threatening emergency, suicidal or homicidal thoughts you must seek medical attention immediately by calling 911 or calling your MD immediately  if symptoms less severe.  You Must read complete instructions/literature along with all the possible adverse reactions/side effects for all the Medicines you take and that have been prescribed to you. Take any new Medicines after you have completely understood and accpet all the possible adverse reactions/side effects.   Please note  You were cared for by a hospitalist during your hospital stay. If you have any questions about your discharge medications or the care you received while you were in the hospital after you are discharged, you can call the unit and asked to speak with the hospitalist on call if the hospitalist that took care of you is not available. Once you are discharged, your primary care physician will handle any further medical issues. Please note that NO REFILLS for any discharge medications will be authorized once you are discharged, as it is imperative that you return to your primary care physician (or establish a relationship with a primary care physician if you do not have one) for your aftercare needs so that they can reassess your need for medications and monitor your lab values.      Home Health Services with [facility] for Physical Therapy

## 2017-04-04 LAB — GLUCOSE, CAPILLARY: Glucose-Capillary: 78 mg/dL (ref 65–99)

## 2017-04-05 LAB — CULTURE, BLOOD (ROUTINE X 2)
CULTURE: NO GROWTH
CULTURE: NO GROWTH
CULTURE: NO GROWTH
SPECIAL REQUESTS: ADEQUATE
Special Requests: ADEQUATE

## 2017-04-27 ENCOUNTER — Other Ambulatory Visit: Payer: Self-pay | Admitting: Internal Medicine

## 2017-04-27 DIAGNOSIS — J690 Pneumonitis due to inhalation of food and vomit: Secondary | ICD-10-CM

## 2017-05-17 ENCOUNTER — Other Ambulatory Visit: Payer: Self-pay

## 2017-05-17 ENCOUNTER — Emergency Department: Payer: Medicare Other

## 2017-05-17 ENCOUNTER — Inpatient Hospital Stay
Admission: EM | Admit: 2017-05-17 | Discharge: 2017-05-19 | DRG: 872 | Disposition: A | Discharge status: Home / Self Care | Payer: Medicare Other | Attending: Internal Medicine | Admitting: Internal Medicine

## 2017-05-17 DIAGNOSIS — R0602 Shortness of breath: Secondary | ICD-10-CM

## 2017-05-17 DIAGNOSIS — N183 Chronic kidney disease, stage 3 (moderate): Secondary | ICD-10-CM | POA: Diagnosis present

## 2017-05-17 DIAGNOSIS — E872 Acidosis, unspecified: Secondary | ICD-10-CM

## 2017-05-17 DIAGNOSIS — E785 Hyperlipidemia, unspecified: Secondary | ICD-10-CM | POA: Diagnosis present

## 2017-05-17 DIAGNOSIS — I714 Abdominal aortic aneurysm, without rupture: Secondary | ICD-10-CM | POA: Diagnosis present

## 2017-05-17 DIAGNOSIS — N179 Acute kidney failure, unspecified: Secondary | ICD-10-CM | POA: Diagnosis present

## 2017-05-17 DIAGNOSIS — I129 Hypertensive chronic kidney disease with stage 1 through stage 4 chronic kidney disease, or unspecified chronic kidney disease: Secondary | ICD-10-CM | POA: Diagnosis present

## 2017-05-17 DIAGNOSIS — Z8546 Personal history of malignant neoplasm of prostate: Secondary | ICD-10-CM

## 2017-05-17 DIAGNOSIS — N390 Urinary tract infection, site not specified: Secondary | ICD-10-CM | POA: Diagnosis present

## 2017-05-17 DIAGNOSIS — E1122 Type 2 diabetes mellitus with diabetic chronic kidney disease: Secondary | ICD-10-CM | POA: Diagnosis present

## 2017-05-17 DIAGNOSIS — N3001 Acute cystitis with hematuria: Secondary | ICD-10-CM | POA: Diagnosis present

## 2017-05-17 DIAGNOSIS — Z7984 Long term (current) use of oral hypoglycemic drugs: Secondary | ICD-10-CM | POA: Diagnosis not present

## 2017-05-17 DIAGNOSIS — Z888 Allergy status to other drugs, medicaments and biological substances status: Secondary | ICD-10-CM | POA: Diagnosis not present

## 2017-05-17 DIAGNOSIS — A419 Sepsis, unspecified organism: Secondary | ICD-10-CM | POA: Diagnosis not present

## 2017-05-17 DIAGNOSIS — Z87891 Personal history of nicotine dependence: Secondary | ICD-10-CM

## 2017-05-17 LAB — URINALYSIS, COMPLETE (UACMP) WITH MICROSCOPIC
Bilirubin Urine: NEGATIVE
GLUCOSE, UA: NEGATIVE mg/dL
KETONES UR: NEGATIVE mg/dL
NITRITE: NEGATIVE
PH: 5 (ref 5.0–8.0)
Protein, ur: 30 mg/dL — AB
SPECIFIC GRAVITY, URINE: 1.008 (ref 1.005–1.030)
SQUAMOUS EPITHELIAL / LPF: NONE SEEN

## 2017-05-17 LAB — GLUCOSE, CAPILLARY
Glucose-Capillary: 185 mg/dL — ABNORMAL HIGH (ref 65–99)
Glucose-Capillary: 273 mg/dL — ABNORMAL HIGH (ref 65–99)
Glucose-Capillary: 239 mg/dL — ABNORMAL HIGH (ref 65–99)

## 2017-05-17 LAB — CBC WITH DIFFERENTIAL/PLATELET
BASOS PCT: 1 %
Basophils Absolute: 0 10*3/uL (ref 0–0.1)
Eosinophils Absolute: 0 10*3/uL (ref 0–0.7)
Eosinophils Relative: 1 %
HEMATOCRIT: 31.3 % — AB (ref 40.0–52.0)
HEMOGLOBIN: 10.2 g/dL — AB (ref 13.0–18.0)
LYMPHS ABS: 0.5 10*3/uL — AB (ref 1.0–3.6)
Lymphocytes Relative: 7 %
MCH: 27.5 pg (ref 26.0–34.0)
MCHC: 32.4 g/dL (ref 32.0–36.0)
MCV: 84.6 fL (ref 80.0–100.0)
MONOS PCT: 3 %
Monocytes Absolute: 0.2 10*3/uL (ref 0.2–1.0)
NEUTROS ABS: 7 10*3/uL — AB (ref 1.4–6.5)
NEUTROS PCT: 90 %
Platelets: 101 10*3/uL — ABNORMAL LOW (ref 150–440)
RBC: 3.7 MIL/uL — AB (ref 4.40–5.90)
RDW: 17.2 % — ABNORMAL HIGH (ref 11.5–14.5)
WBC: 7.8 10*3/uL (ref 3.8–10.6)

## 2017-05-17 LAB — COMPREHENSIVE METABOLIC PANEL
ALK PHOS: 105 U/L (ref 38–126)
ALT: 23 U/L (ref 17–63)
ANION GAP: 11 (ref 5–15)
AST: 31 U/L (ref 15–41)
Albumin: 3.2 g/dL — ABNORMAL LOW (ref 3.5–5.0)
BILIRUBIN TOTAL: 1.2 mg/dL (ref 0.3–1.2)
BUN: 49 mg/dL — AB (ref 6–20)
CO2: 20 mmol/L — AB (ref 22–32)
Calcium: 8.3 mg/dL — ABNORMAL LOW (ref 8.9–10.3)
Chloride: 103 mmol/L (ref 101–111)
Creatinine, Ser: 3.14 mg/dL — ABNORMAL HIGH (ref 0.61–1.24)
GFR, EST AFRICAN AMERICAN: 18 mL/min — AB (ref >60)
GFR, EST NON AFRICAN AMERICAN: 16 mL/min — AB (ref >60)
GLUCOSE: 94 mg/dL (ref 65–99)
Potassium: 4.2 mmol/L (ref 3.5–5.1)
SODIUM: 134 mmol/L — AB (ref 135–145)
Total Protein: 7.6 g/dL (ref 6.5–8.1)

## 2017-05-17 LAB — PROTIME-INR
INR: 1.35
Prothrombin Time: 16.6 seconds — ABNORMAL HIGH (ref 11.4–15.2)

## 2017-05-17 LAB — LACTIC ACID, PLASMA
LACTIC ACID, VENOUS: 3.5 mmol/L — AB (ref 0.5–1.9)
Lactic Acid, Venous: 1.6 mmol/L (ref 0.5–1.9)

## 2017-05-17 MED ORDER — ACETAMINOPHEN 650 MG RE SUPP: 650.0000 mg | Freq: Four times a day (QID) | RECTAL | Status: DC | PRN

## 2017-05-17 MED ORDER — CEFTRIAXONE SODIUM IN DEXTROSE 20 MG/ML IV SOLN
1.0000 g | Freq: Once | INTRAVENOUS | Status: AC
  Administered 2017-05-17: 1 g via INTRAVENOUS
  Filled 2017-05-17: qty 50

## 2017-05-17 MED ORDER — HEPARIN SODIUM (PORCINE) 5000 UNIT/ML IJ SOLN
5000.0000 [IU] | Freq: Three times a day (TID) | INTRAMUSCULAR | Status: DC
  Administered 2017-05-17 – 2017-05-18 (×4): 5000 [IU] via SUBCUTANEOUS
  Filled 2017-05-17 (×4): qty 1

## 2017-05-17 MED ORDER — ONDANSETRON HCL 4 MG PO TABS: 4.0000 mg | ORAL_TABLET | Freq: Four times a day (QID) | ORAL | Status: DC | PRN

## 2017-05-17 MED ORDER — GUAIFENESIN-DM 100-10 MG/5ML PO SYRP
5.0000 mL | ORAL_SOLUTION | ORAL | Status: DC | PRN
  Filled 2017-05-17: qty 5

## 2017-05-17 MED ORDER — FAMOTIDINE 20 MG PO TABS: 20.0000 mg | ORAL_TABLET | ORAL | Status: DC

## 2017-05-17 MED ORDER — DEXTROSE 5 % IV SOLN
INTRAVENOUS | Status: DC
  Administered 2017-05-18 – 2017-05-19 (×2): via INTRAVENOUS
  Filled 2017-05-17 (×2): qty 10

## 2017-05-17 MED ORDER — ROSUVASTATIN CALCIUM 5 MG PO TABS
5.0000 mg | ORAL_TABLET | Freq: Every day | ORAL | Status: DC
  Administered 2017-05-17 – 2017-05-18 (×2): 5 mg via ORAL
  Filled 2017-05-17 (×2): qty 1

## 2017-05-17 MED ORDER — AZELASTINE HCL 0.1 % NA SOLN
1.0000 | Freq: Two times a day (BID) | NASAL | Status: DC
  Administered 2017-05-17 – 2017-05-19 (×4): 1 via NASAL
  Filled 2017-05-17: qty 30

## 2017-05-17 MED ORDER — ALBUTEROL SULFATE (2.5 MG/3ML) 0.083% IN NEBU: 2.5000 mg | INHALATION_SOLUTION | RESPIRATORY_TRACT | Status: DC | PRN

## 2017-05-17 MED ORDER — CEFTRIAXONE SODIUM IN DEXTROSE 20 MG/ML IV SOLN
1.0000 g | INTRAVENOUS | Status: DC
  Filled 2017-05-17: qty 50

## 2017-05-17 MED ORDER — FAMOTIDINE 20 MG PO TABS
20.0000 mg | ORAL_TABLET | Freq: Two times a day (BID) | ORAL | Status: DC
  Administered 2017-05-17: 13:00:00 20 mg via ORAL
  Filled 2017-05-17: qty 1

## 2017-05-17 MED ORDER — TAMSULOSIN HCL 0.4 MG PO CAPS
0.4000 mg | ORAL_CAPSULE | Freq: Every day | ORAL | Status: DC
  Administered 2017-05-17 – 2017-05-18 (×2): 0.4 mg via ORAL
  Filled 2017-05-17 (×2): qty 1

## 2017-05-17 MED ORDER — AMLODIPINE BESYLATE 5 MG PO TABS
5.0000 mg | ORAL_TABLET | Freq: Every day | ORAL | Status: DC
  Administered 2017-05-17 – 2017-05-19 (×3): 5 mg via ORAL
  Filled 2017-05-17 (×3): qty 1

## 2017-05-17 MED ORDER — FINASTERIDE 5 MG PO TABS
5.0000 mg | ORAL_TABLET | Freq: Every day | ORAL | Status: DC
  Administered 2017-05-17 – 2017-05-19 (×3): 5 mg via ORAL
  Filled 2017-05-17 (×3): qty 1

## 2017-05-17 MED ORDER — SODIUM CHLORIDE 0.9 % IV BOLUS (SEPSIS)
1000.0000 mL | Freq: Once | INTRAVENOUS | Status: AC
  Administered 2017-05-17: 1000 mL via INTRAVENOUS

## 2017-05-17 MED ORDER — POLYETHYLENE GLYCOL 3350 17 G PO PACK: 17.0000 g | PACK | Freq: Every day | ORAL | Status: DC | PRN

## 2017-05-17 MED ORDER — ACETAMINOPHEN 325 MG PO TABS
650.0000 mg | ORAL_TABLET | Freq: Four times a day (QID) | ORAL | Status: DC | PRN
  Administered 2017-05-17 – 2017-05-18 (×2): 650 mg via ORAL
  Filled 2017-05-17 (×2): qty 2

## 2017-05-17 MED ORDER — SODIUM CHLORIDE 0.9 % IV SOLN
INTRAVENOUS | Status: AC
  Administered 2017-05-17 (×2): via INTRAVENOUS

## 2017-05-17 MED ORDER — GLIMEPIRIDE 2 MG PO TABS
1.0000 mg | ORAL_TABLET | Freq: Every day | ORAL | Status: DC
  Administered 2017-05-18 – 2017-05-19 (×2): 1 mg via ORAL
  Filled 2017-05-17: qty 1
  Filled 2017-05-17: qty 0.5

## 2017-05-17 MED ORDER — ONDANSETRON HCL 4 MG/2ML IJ SOLN: 4.0000 mg | Freq: Four times a day (QID) | INTRAMUSCULAR | Status: DC | PRN

## 2017-05-17 MED ORDER — INSULIN ASPART 100 UNIT/ML ~~LOC~~ SOLN
0.0000 [IU] | Freq: Three times a day (TID) | SUBCUTANEOUS | Status: DC
  Administered 2017-05-17: 13:00:00 2 [IU] via SUBCUTANEOUS
  Administered 2017-05-17: 18:00:00 5 [IU] via SUBCUTANEOUS
  Administered 2017-05-18 (×2): 2 [IU] via SUBCUTANEOUS
  Administered 2017-05-18: 1 [IU] via SUBCUTANEOUS
  Filled 2017-05-17 (×5): qty 1

## 2017-05-17 MED ORDER — METOPROLOL TARTRATE 25 MG PO TABS
12.5000 mg | ORAL_TABLET | Freq: Two times a day (BID) | ORAL | Status: DC
  Administered 2017-05-17 – 2017-05-19 (×5): 12.5 mg via ORAL
  Filled 2017-05-17 (×5): qty 1

## 2017-05-17 NOTE — ED Notes (Signed)
Attempted to call report x 1  

## 2017-05-17 NOTE — Progress Notes (Signed)
[facility]  Patient Status: Active  [facility] is providing the following services: PT  If patient discharges after hours, please call [phone].   Blake [name] 05/17/2017, 4:34 PM

## 2017-05-17 NOTE — ED Notes (Signed)
Dr Dahlia Client notified of pt's elevated Lactic Acid level of 3.5

## 2017-05-17 NOTE — ED Notes (Signed)
Admitting MD at bedside.

## 2017-05-17 NOTE — ED Notes (Signed)
Patient transported to X-ray at this time 

## 2017-05-17 NOTE — ED Triage Notes (Signed)
Pt arrives to ED via [facility] from home with c/o [facility]. Per EMS, pt was seen and treated at this facility yesterday for a UTI. EMS states that initial O2 sats were 87% on RA, improved to 97% when the pt was placed on NRB. EMS reports pt was given 5mg  Albuterol and 0.5mg  Ipratropium en route to ED. Pt is dialysis pt.

## 2017-05-17 NOTE — Evaluation (Signed)
Physical Therapy Evaluation Patient Details Name: Blake [name] MRN: [id number] DOB: 1922/09/17 Today's Date: 05/17/2017   History of Present Illness  Pt is a 83 y/o M who presented with cough and some SOB.  Pt found to have UTI the day prior and sent home with antibiotics. Pt's PMH includes AAA, prostate cancer.    Clinical Impression  Pt admitted with above diagnosis. Pt currently with functional limitations due to the deficits listed below (see PT Problem List). Blake [name] presents close to his baseline level of mobility.  He ambulated 100 ft with RW and min guard for safety.  He requires min assist for bed mobility.  He has 24/7 assist/supervision provided by daughter and caregivers.  He demonstrates impaired balance, endurance, and poor posture and gait mechanics and will benefit from OPPT at d/c.  Pt will benefit from skilled PT to increase their independence and safety with mobility to allow discharge to the venue listed below.      Follow Up Recommendations Outpatient PT;Supervision/Assistance - 24 hour    Equipment Recommendations  None recommended by PT    Recommendations for Other Services       Precautions / Restrictions Precautions Precautions: Fall Restrictions Weight Bearing Restrictions: No      Mobility  Bed Mobility Overal bed mobility: Needs Assistance Bed Mobility: Supine to Sit;Sit to Supine     Supine to sit: Min assist Sit to supine: Min assist   General bed mobility comments: Min assist to bring LEs OOB, into bed  Transfers Overall transfer level: Needs assistance Equipment used: Rolling walker (2 wheeled) Transfers: Sit to/from Stand Sit to Stand: Min guard         General transfer comment: Cues for proper hand placement.  Pt slow but steady when rising.  Cues to reach back for bed when preparing to sit.   Ambulation/Gait Ambulation/Gait assistance: Min guard Ambulation Distance (Feet): 100 Feet Assistive device: Rolling walker (2  wheeled) Gait Pattern/deviations: Step-through pattern;Trunk flexed   Gait velocity interpretation: at or above normal speed for age/gender General Gait Details: Pt demonstrates flexed posture which is his baseline per pt and son.  Cues for upright posture and to keep RW closer as he has tendency to push it too far ahead.  No signs of instability, close min guard for safety.   Stairs            Wheelchair Mobility    Modified Rankin (Stroke Patients Only)       Balance Overall balance assessment: Needs assistance Sitting-balance support: No upper extremity supported;Feet supported Sitting balance-Leahy Scale: Good     Standing balance support: Single extremity supported;During functional activity Standing balance-Leahy Scale: Poor Standing balance comment: Pt relies on UE support for static and dynamic activities                             Pertinent Vitals/Pain Pain Assessment: No/denies pain    Home Living Family/patient expects to be discharged to:: Private residence Living Arrangements: Spouse/significant other Available Help at Discharge: Family;Personal care attendant;Available 24 hours/day Type of Home: House Home Access: Ramped entrance     Home Layout: Multi-level;Able to live on main level with bedroom/bathroom Home Equipment: Walker - 4 wheels;Cane - single point;Bedside commode Additional Comments: Pt has a caregiver while daughter away at work.      Prior Function Level of Independence: Needs assistance   Gait / Transfers Assistance Needed: Pt ambualtes household distances with  rollator.  Pt denies any falls in the past 6 months.    ADL's / Homemaking Assistance Needed: Pt has assist from caregivers for bathing, dressing.  Daughter does the cooking, cleaning.          Hand Dominance        Extremity/Trunk Assessment   Upper Extremity Assessment Upper Extremity Assessment: Overall WFL for tasks assessed    Lower Extremity  Assessment Lower Extremity Assessment: (BLE strength grossly 4/5)    Cervical / Trunk Assessment Cervical / Trunk Assessment: Kyphotic  Communication   Communication: No difficulties  Cognition Arousal/Alertness: Awake/alert Behavior During Therapy: WFL for tasks assessed/performed Overall Cognitive Status: Within Functional Limits for tasks assessed                                        General Comments General comments (skin integrity, edema, etc.): Son and daughter in law present during evaluation.  BP taken in supine at start of session reading 132/68.      Exercises General Exercises - Lower Extremity Long Arc Quad: AROM;Both;15 reps;Seated Straight Leg Raises: Both;5 reps;Supine Hip Flexion/Marching: Both;15 reps;Seated   Assessment/Plan    PT Assessment Patient needs continued PT services  PT Problem List Decreased strength;Decreased balance;Decreased knowledge of use of DME;Decreased safety awareness       PT Treatment Interventions DME instruction;Gait training;Functional mobility training;Therapeutic activities;Stair training;Therapeutic exercise;Balance training;Patient/family education;Neuromuscular re-education    PT Goals (Current goals can be found in the Care Plan section)  Acute Rehab PT Goals Patient Stated Goal: to go home PT Goal Formulation: With patient Time For Goal Achievement: 05/31/17 Potential to Achieve Goals: Good    Frequency Min 2X/week   Barriers to discharge        Co-evaluation               AM-PAC PT "6 Clicks" Daily Activity  Outcome Measure Difficulty turning over in bed (including adjusting bedclothes, sheets and blankets)?: A Lot Difficulty moving from lying on back to sitting on the side of the bed? : A Lot Difficulty sitting down on and standing up from a chair with arms (e.g., wheelchair, bedside commode, etc,.)?: A Lot Help needed moving to and from a bed to chair (including a wheelchair)?: A  Little Help needed walking in hospital room?: A Little Help needed climbing 3-5 steps with a railing? : A Little 6 Click Score: 15    End of Session Equipment Utilized During Treatment: Gait belt Activity Tolerance: Patient tolerated treatment well Patient left: in bed;with call bell/phone within reach;with bed alarm set;with family/visitor present Nurse Communication: Mobility status PT Visit Diagnosis: Unsteadiness on feet (R26.81);Muscle weakness (generalized) (M62.81);Other abnormalities of gait and mobility (R26.89)    Time: 5053-9767 PT Time Calculation (min) (ACUTE ONLY): 40 min   Charges:   PT Evaluation $PT Eval Low Complexity: 1 Low PT Treatments $Gait Training: 8-22 mins $Therapeutic Activity: 8-22 mins   PT G Codes:   PT G-Codes **NOT FOR INPATIENT CLASS** Functional Assessment Tool Used: AM-PAC 6 Clicks Basic Mobility;Clinical judgement Functional Limitation: Mobility: Walking and moving around Mobility: Walking and Moving Around Current Status (H4193): At least 40 percent but less than 60 percent impaired, limited or restricted Mobility: Walking and Moving Around Goal Status [phone]): At least 1 percent but less than 20 percent impaired, limited or restricted    Collie Siad PT, DPT 05/17/2017, 1:22 PM

## 2017-05-17 NOTE — ED Provider Notes (Signed)
[facility] Emergency Department Provider Note   ____________________________________________   First MD Initiated Contact with Patient 05/17/17 [phone]     (approximate)  I have reviewed the triage vital signs and the nursing notes.   HISTORY  Chief Complaint Shortness of Breath    HPI Blake [name] is a 83 y.o. male who comes into the hospital today not feeling well. The patient was seen yesterday and diagnosed with a UTI. He was sent home with some antibiotics. This evening he was found by his family with shortness of breath. According to EMS the patient's initial O2 sats were 87%. He also had some rhonchi. They gave the patient a DuoNeb as well as some Solu-Medrol. He states that he had shortness of breath yesterday when he was seen as well but he wasn't checked for it. The patient has been coughing up some yellow sputum but he states he hasn't been coughing up anything. He denies any chest pain and says that his shortness of breath has improved. He does have some dizziness and lightheadedness. He denies any nausea or vomiting. He is here today for evaluation.   Past Medical History:  Diagnosis Date  . AAA (abdominal aortic aneurysm) ([place])   . Arrhythmia   . Diabetes mellitus without complication ([place])   . GI bleed   . Heart murmur   . Hyperlipidemia   . Hypertension   . Prostate cancer [facility])     Patient Active Problem List   Diagnosis Date Noted  . Blood in stool   . Hemorrhage of rectum and anus   . Lower GI bleed 10/20/2016  . Near syncope   . Acute renal failure superimposed on chronic kidney disease ([place])   . Pneumonia 10/09/2016  . Aspiration pneumonia ([place]) 10/09/2016  . Acute respiratory failure ([place]) 10/09/2016  . AAA (abdominal aortic aneurysm) without rupture ([place]) 04/27/2016  . Arthritis, degenerative 03/31/2016  . A-fib ([place]) 03/31/2016  . HLD (hyperlipidemia) 03/31/2016  . CA of prostate ([place]) 03/31/2016  . SVT  (supraventricular tachycardia) ([place]) 04/09/2015  . Diabetes mellitus, type 2 ([place]) 04/09/2015  . Benign hypertension 04/09/2015  . Acute myocardial infarction, initial episode of care [facility]) 04/09/2015    Past Surgical History:  Procedure Laterality Date  . JOINT REPLACEMENT    . PROSTATE SURGERY      Prior to Admission medications   Medication Sig Start Date End Date Taking? Authorizing Provider  amLODipine (NORVASC) 5 MG tablet Take 5 mg by mouth daily.   Yes [provider]  finasteride (PROSCAR) 5 MG tablet Take 5 mg by mouth daily.   Yes [provider]  glimepiride (AMARYL) 2 MG tablet 2 tablets in the morning and one tablet at night Patient taking differently: Take 4 mg by mouth 2 (two) times daily.  04/15/15  Yes [name], MD  guaiFENesin-dextromethorphan (ROBITUSSIN DM) 100-10 MG/5ML syrup Take 5 mLs by mouth every 4 (four) hours as needed for cough. 04/03/17  Yes Dustin Flock, MD  levofloxacin (LEVAQUIN) 500 MG tablet Take 500 mg daily by mouth. 05/16/17 05/26/17 Yes [provider]  metoprolol tartrate (LOPRESSOR) 25 MG tablet Take 0.5 tablets (12.5 mg total) by mouth 2 (two) times daily. 10/13/16  Yes Demetrios Loll, MD  Multiple Vitamins-Minerals (PRESERVISION/LUTEIN PO) Take 1 capsule by mouth 2 (two) times daily.   Yes [provider]  ranitidine (ZANTAC) 150 MG tablet Take 150 mg by mouth 2 (two) times daily.   Yes [provider]  rosuvastatin (CRESTOR) 5 MG tablet Take 5 mg by mouth at bedtime.   Yes [provider]  sitaGLIPtin (JANUVIA) 50 MG tablet Take 50 mg by mouth daily.   Yes [provider]  tamsulosin (FLOMAX) 0.4 MG CAPS capsule Take 0.4 mg by mouth at bedtime.   Yes [provider]  triamcinolone cream (KENALOG) 0.5 % Apply 1 application topically 2 (two) times daily.   Yes [provider]  azelastine (ASTELIN) 0.1 % nasal spray Place 1 spray into both nostrils 2 (two) times  daily. Use in each nostril as directed    [provider]    Allergies Diovan [valsartan]; Hydralazine; and Lisinopril  Family History  Problem Relation Age of Onset  . Hypertension Other   . Stroke Other   . Heart attack Other   . Stroke Sister   . Heart attack Brother     Social History Social History   Tobacco Use  . Smoking status: Former Research scientist (life sciences)  . Smokeless tobacco: Never Used  Substance Use Topics  . Alcohol use: No  . Drug use: No    Review of Systems  Constitutional:  fever/chills Eyes: No visual changes. ENT: No sore throat. Cardiovascular: Denies chest pain. Respiratory: Cough and shortness of breath. Gastrointestinal: No abdominal pain.  No nausea, no vomiting.  No diarrhea.  No constipation. Genitourinary: Urinary tract infection Musculoskeletal: Negative for back pain. Skin: Negative for rash. Neurological: Lightheadedness and dizziness   ____________________________________________   PHYSICAL EXAM:  VITAL SIGNS: ED Triage Vitals  Enc Vitals Group     BP 05/17/17 0609 129/70     Pulse Rate 05/17/17 0609 (!) 113     Resp 05/17/17 0609 (!) 26     Temp 05/17/17 0609 100.2 F (37.9 C)     Temp Source 05/17/17 0609 Oral     SpO2 05/17/17 0607 97 %     Weight 05/17/17 0610 280 lb (127 kg)     Height 05/17/17 0610 6\' 2"  (1.88 m)     Head Circumference --      Peak Flow --      Pain Score 05/17/17 0609 0     Pain Loc --      Pain Edu? --      Excl. in [place]? --     Constitutional: Alert and oriented. Well appearing and in mild distress. Eyes: Conjunctivae are normal. PERRL. EOMI. Head: Atraumatic. Nose: No congestion/rhinnorhea. Mouth/Throat: Mucous membranes are moist.  Oropharynx non-erythematous. Cardiovascular: Tachycardia, regular rhythm. Grossly normal heart sounds.  Good peripheral circulation. Respiratory: Normal respiratory effort.  No retractions. Coarse expiratory breath sounds in the lower lobes Gastrointestinal: Soft and  nontender. No distention. No abdominal bruits. No CVA tenderness. Musculoskeletal: No lower extremity tenderness nor edema.   Neurologic:  Normal speech and language.  Skin:  Skin is warm, dry and intact.  Psychiatric: Mood and affect are normal.   ____________________________________________   LABS (all labs ordered are listed, but only abnormal results are displayed)  Labs Reviewed  COMPREHENSIVE METABOLIC PANEL - Abnormal; Notable for the following components:      Result Value   Sodium 134 (*)    CO2 20 (*)    BUN 49 (*)    Creatinine, Ser 3.14 (*)    Calcium 8.3 (*)    Albumin 3.2 (*)    GFR calc non Af Amer 16 (*)    GFR calc Af Amer 18 (*)    All other components within normal limits  LACTIC ACID, PLASMA - Abnormal; Notable for the following components:   Lactic Acid, Venous 3.5 (*)    All other components within normal limits  CBC WITH DIFFERENTIAL/PLATELET - Abnormal; Notable for the following components:   RBC 3.70 (*)    Hemoglobin 10.2 (*)    HCT 31.3 (*)    RDW 17.2 (*)    Platelets 101 (*)    Neutro Abs 7.0 (*)    Lymphs Abs 0.5 (*)    All other components within normal limits  PROTIME-INR - Abnormal; Notable for the following components:   Prothrombin Time 16.6 (*)    All other components within normal limits  URINALYSIS, COMPLETE (UACMP) WITH MICROSCOPIC - Abnormal; Notable for the following components:   Color, Urine YELLOW (*)    APPearance TURBID (*)    Hgb urine dipstick MODERATE (*)    Protein, ur 30 (*)    Leukocytes, UA LARGE (*)    Bacteria, UA MANY (*)    Non Squamous Epithelial 6-30 (*)    All other components within normal limits  CULTURE, BLOOD (ROUTINE X 2)  CULTURE, BLOOD (ROUTINE X 2)  URINE CULTURE  LACTIC ACID, PLASMA   ____________________________________________  EKG  ED ECG REPORT I, Loney Hering, the attending physician, personally viewed and interpreted this ECG.   Date: 05/17/2017  EKG Time: 0609  Rate: 113   Rhythm: sinus tachycardia  Axis: left axis deviation  Intervals:right bundle branch block and left anterior fascicular block  ST&T Change: Flipped T waves in lead V2 and V1.  ____________________________________________  RADIOLOGY  Dg Chest 2 View  Result Date: 05/17/2017 CLINICAL DATA:  Shortness of breath for 3 days.  Cardiac arrhythmia. EXAM: CHEST  2 VIEW COMPARISON:  03/31/2017 FINDINGS: Heart size remains normal. Dual lead transvenous pacemaker in appropriate position. Insert aorta Previously seen infiltrate in the left lung base has resolved. Mild bibasilar scarring noted. No evidence of pleural effusion. IMPRESSION: No active cardiopulmonary disease. Electronically Signed   By: Earle Gell M.D.   On: 05/17/2017 07:44    ____________________________________________   PROCEDURES  Procedure(s) performed: None  Procedures  Critical Care performed: No  ____________________________________________   INITIAL IMPRESSION / ASSESSMENT AND PLAN / ED COURSE  As part of my medical decision making, I reviewed the following data within the electronic MEDICAL RECORD NUMBER Notes from prior ED visits and [place] Controlled Substance Database   This is a 83 year old male who comes into the hospital today feeling unwell. He was diagnosed with a urinary tract infection yesterday. He was found to be short of breath today.  My differential diagnosis includes sepsis due to pneumonia or urinary tract infection, ST segment elevation MI, CHF or pulmonary edema  We did check some blood work. The patient's white blood cell count is negative but he does have a lactic acid of 3.5. His urine shows too numerous to count red cells and white cells along with  many bacteria. The patient's creatinine is also elevated at 3.14. We'll give the patient liter of normal saline and a dose of ceftriaxone. I am still awaiting the results of the patient's chest x-ray. After the Solu-Medrol and DuoNeb's the patient's breathing  is improved. His O2 sats are in the 90s on room air and his respiratory rate has improved.    The patient's chest x-ray did not show any pneumonia. I did admit the patient to the hospitalist service given his urinary tract infection. He will be further evaluated.  ____________________________________________  FINAL CLINICAL IMPRESSION(S) / ED DIAGNOSES  Final diagnoses:  Shortness of breath  Sepsis, due to unspecified organism ([place])  Lactic acidosis  Acute cystitis with hematuria      Note:  This document was prepared using Dragon voice recognition software and may include unintentional dictation errors.   Loney Hering, MD 05/17/17 [phone]

## 2017-05-17 NOTE — H&P (Signed)
[facility] [place] NAME: Lesly Joslyn    MR#:  [id number]  DATE OF BIRTH:  16-Jan-1923  DATE OF ADMISSION:  05/17/2017  PRIMARY CARE PHYSICIAN: Adin Hector, MD   REQUESTING/REFERRING PHYSICIAN: Dr. Dahlia Client  CHIEF COMPLAINT:   Chief Complaint  Patient presents with  . Shortness of Breath    HISTORY OF PRESENT ILLNESS:  Lena Fieldhouse  is a 83 y.o. male with a known history of DM, HTN, CKD3 here with not feeling well, cough and some SOB. Was given levquin by his urologist Dr. Jacqlyn Larsen yesterday and got 1 dose. Today he has UTI with lactic acid 3.5. AKi Baseline Cr 2-2.5. Patient was confused earlier but now back to baseline per family  PAST MEDICAL HISTORY:   Past Medical History:  Diagnosis Date  . AAA (abdominal aortic aneurysm) ([place])   . Arrhythmia   . Diabetes mellitus without complication ([place])   . GI bleed   . Heart murmur   . Hyperlipidemia   . Hypertension   . Prostate cancer ([place])     PAST SURGICAL HISTORY:   Past Surgical History:  Procedure Laterality Date  . JOINT REPLACEMENT    . PROSTATE SURGERY      SOCIAL HISTORY:   Social History   Tobacco Use  . Smoking status: Former Research scientist (life sciences)  . Smokeless tobacco: Never Used  Substance Use Topics  . Alcohol use: No    FAMILY HISTORY:   Family History  Problem Relation Age of Onset  . Hypertension Other   . Stroke Other   . Heart attack Other   . Stroke Sister   . Heart attack Brother     DRUG ALLERGIES:   Allergies  Allergen Reactions  . Diovan [Valsartan] Cough  . Hydralazine Itching  . Lisinopril Cough    REVIEW OF SYSTEMS:   Review of Systems  Constitutional: Positive for malaise/fatigue. Negative for chills, fever and weight loss.  HENT: Negative for hearing loss and nosebleeds.   Eyes: Negative for blurred vision, double vision and pain.  Respiratory: Positive for cough. Negative for hemoptysis, sputum production, shortness of breath and  wheezing.   Cardiovascular: Negative for chest pain, palpitations, orthopnea and leg swelling.  Gastrointestinal: Negative for abdominal pain, constipation, diarrhea, nausea and vomiting.  Genitourinary: Negative for dysuria and hematuria.  Musculoskeletal: Negative for back pain, falls and myalgias.  Skin: Negative for rash.  Neurological: Positive for weakness. Negative for dizziness, tremors, sensory change, speech change, focal weakness, seizures and headaches.  Endo/Heme/Allergies: Does not bruise/bleed easily.  Psychiatric/Behavioral: Positive for memory loss. Negative for depression. The patient is not nervous/anxious.     MEDICATIONS AT HOME:   Prior to Admission medications   Medication Sig Start Date End Date Taking? Authorizing Provider  amLODipine (NORVASC) 5 MG tablet Take 5 mg by mouth daily.   Yes [provider]  finasteride (PROSCAR) 5 MG tablet Take 5 mg by mouth daily.   Yes [provider]  glimepiride (AMARYL) 2 MG tablet 2 tablets in the morning and one tablet at night Patient taking differently: Take 4 mg by mouth 2 (two) times daily.  04/15/15  Yes [name], MD  guaiFENesin-dextromethorphan (ROBITUSSIN DM) 100-10 MG/5ML syrup Take 5 mLs by mouth every 4 (four) hours as needed for cough. 04/03/17  Yes Dustin Flock, MD  levofloxacin (LEVAQUIN) 500 MG tablet Take 500 mg daily by mouth. 05/16/17 05/26/17 Yes [provider]  metoprolol tartrate (LOPRESSOR)  25 MG tablet Take 0.5 tablets (12.5 mg total) by mouth 2 (two) times daily. 10/13/16  Yes Demetrios Loll, MD  Multiple Vitamins-Minerals (PRESERVISION/LUTEIN PO) Take 1 capsule by mouth 2 (two) times daily.   Yes [provider]  ranitidine (ZANTAC) 150 MG tablet Take 150 mg by mouth 2 (two) times daily.   Yes [provider]  rosuvastatin (CRESTOR) 5 MG tablet Take 5 mg by mouth at bedtime.   Yes [provider]  sitaGLIPtin (JANUVIA) 50 MG tablet Take 50 mg by  mouth daily.   Yes [provider]  tamsulosin (FLOMAX) 0.4 MG CAPS capsule Take 0.4 mg by mouth at bedtime.   Yes [provider]  triamcinolone cream (KENALOG) 0.5 % Apply 1 application topically 2 (two) times daily.   Yes [provider]  azelastine (ASTELIN) 0.1 % nasal spray Place 1 spray into both nostrils 2 (two) times daily. Use in each nostril as directed    [provider]     VITAL SIGNS:  Blood pressure 122/75, pulse 88, temperature 100.2 F (37.9 C), temperature source Oral, resp. rate (!) 21, height 6\' 2"  (1.88 m), weight 127 kg (280 lb), SpO2 97 %.  PHYSICAL EXAMINATION:  Physical Exam  GENERAL:  83 y.o.-year-old patient lying in the bed with no acute distress.  EYES: Pupils equal, round, reactive to light and accommodation. No scleral icterus. Extraocular muscles intact.  HEENT: Head atraumatic, normocephalic. Oropharynx and nasopharynx clear. No oropharyngeal erythema, moist oral mucosa  NECK:  Supple, no jugular venous distention. No thyroid enlargement, no tenderness.  LUNGS: Normal breath sounds bilaterally, no wheezing, rales, rhonchi. No use of accessory muscles of respiration.  CARDIOVASCULAR: S1, S2 normal. No murmurs, rubs, or gallops.  ABDOMEN: Soft, nontender, nondistended. Bowel sounds present. No organomegaly or mass.  EXTREMITIES: No pedal edema, cyanosis, or clubbing. + 2 pedal & radial pulses b/l.   NEUROLOGIC: Cranial nerves II through XII are intact. No focal Motor or sensory deficits appreciated b/l PSYCHIATRIC: The patient is alert and oriented x 3. Good affect.  SKIN: No obvious rash, lesion, or ulcer.   LABORATORY PANEL:   CBC Recent Labs  Lab 05/17/17 0611  WBC 7.8  HGB 10.2*  HCT 31.3*  PLT 101*   ------------------------------------------------------------------------------------------------------------------  Chemistries  Recent Labs  Lab 05/17/17 0611  NA 134*  K 4.2  CL 103  CO2 20*  GLUCOSE  94  BUN 49*  CREATININE 3.14*  CALCIUM 8.3*  AST 31  ALT 23  ALKPHOS 105  BILITOT 1.2   ------------------------------------------------------------------------------------------------------------------  Cardiac Enzymes No results for input(s): TROPONINI in the last 168 hours. ------------------------------------------------------------------------------------------------------------------  RADIOLOGY:  Dg Chest 2 View  Result Date: 05/17/2017 CLINICAL DATA:  Shortness of breath for 3 days.  Cardiac arrhythmia. EXAM: CHEST  2 VIEW COMPARISON:  03/31/2017 FINDINGS: Heart size remains normal. Dual lead transvenous pacemaker in appropriate position. Insert aorta Previously seen infiltrate in the left lung base has resolved. Mild bibasilar scarring noted. No evidence of pleural effusion. IMPRESSION: No active cardiopulmonary disease. Electronically Signed   By: Earle Gell M.D.   On: 05/17/2017 07:44     IMPRESSION AND PLAN:   * UTI with sepsis/AKI/hematuria Start IV ceftriaxone. IVF Monitor I/Os Repeat labs in AM Cx pending  * DM SSI Glipizide  * HTN Continue home meds  * DVT prophylaxis heparin  All the records are reviewed and case discussed with ED provider. Management plans discussed with the patient, family and they are in agreement.  CODE STATUS: FULL CODE  TOTAL TIME TAKING CARE OF THIS PATIENT: 40 minutes.   Hillary Bow R M.D on 05/17/2017 at 8:31 AM  Between 7am to 6pm - Pager - [phone]  After 6pm go to [email or web address] - password [email or web address] [facility]  [facility] [place] [facility]  Office  [phone]  CC: Primary care physician; Adin Hector, MD  Note: This dictation was prepared with Dragon dictation along with smaller phrase technology. Any transcriptional errors that result from this process are unintentional.

## 2017-05-17 NOTE — Progress Notes (Signed)
Renal Dosing Adjustment  Famotidine renally adjusted to 20 mg PO q48 hours for CrCl < 20 ml/min.   Larene Beach, PharmD

## 2017-05-17 NOTE — Evaluation (Signed)
Clinical/Bedside Swallow Evaluation Patient Details  Name: Blake [name] MRN: [id number] Date of Birth: Nov 06, 1922  Today's Date: 05/17/2017 Time: SLP Start Time (ACUTE ONLY): 1400 SLP Stop Time (ACUTE ONLY): 1500 SLP Time Calculation (min) (ACUTE ONLY): 60 min  Past Medical History:  Past Medical History:  Diagnosis Date  . AAA (abdominal aortic aneurysm) ([place])   . Arrhythmia   . Diabetes mellitus without complication ([place])   . GI bleed   . Heart murmur   . Hyperlipidemia   . Hypertension   . Prostate cancer [facility])    Past Surgical History:  Past Surgical History:  Procedure Laterality Date  . JOINT REPLACEMENT    . PROSTATE SURGERY     HPI:  Pt is a 83 y/o M who presented with cough and some SOB.  Pt found to have UTI the day prior and sent home with antibiotics. Pt's past med hx includes AAA, afib, DM, Reflux per pt/Son, prostate cancer, and GI bleed. Pt's Son stated pt had a significant vomiting episode during a previous GI bleed and developed pneumonia from suspected aspiration of the vomit at that time. Pt and Son deny any overt swallowing issues at home and deny any overt coughing or difficulty swallowing foods/liquids at home. HOWEVER, both endorse s/s of REFLUX at home.    Assessment / Plan / Recommendation Clinical Impression  Pt appears to exhibit adequate oropharyngeal phase swallow function and is at reduced risk for aspiration from an oropharyngeal phase standpoint. However, after discussing symptoms w/ pt's Son and the pt, suspect esophageal dysmotility and REFLUX. With any episodes of Regurgitation of REFLUX, potential aspiration of REFLUX can occur and can result in pulmonary/respiratory decline from the negative sequelae of such. Pt was fully awake and alert, and Son was present during evaluation.  Pt was given trials of puree, soft solid, and thin liquid via Cup. Pt's oral phase appears to be grossly [facility] c/b timely A-P transit and swallow, rotary mastication pattern,  and adequate oral clearing w/ trials given. No overt, immediate s/s of aspiration were noted given all trials. No decline in vocal quality or respiratory status were noted. Pt was able to feed self independently. Of note, pt completed much of his Lunch meal w/ no swallowing deficits noted during brief observation and by NSG/pt/Son's report. ST educated on general aspiration precautions including sitting upright and reducing distractions during meals, as well as small bites/sips and eating/drinking at a slower pace.  In light of overt s/s of REFLUX(belching, sour taste in the throat during the night and in the morning upon waking, dry coughing AFTER meals, feelings of "fullness"), ST thoroughly educated pt/Son on general REFLUX precautions including sitting upright during meals and for ~60 minutes afterwards, do not lie down for bed for 2-3 hours after eating, slow pace of eating w/ rest breaks as needed, stopping food consumption before the "full" feeling, choosing foods wisely (easy to digest, limiting breads/meats or other foods that are irritating), walking around throughout the day, sleeping on an incline(HOB elevated 6-8 inches), and the advantages of dicussing PPIs w/ GI/MD. ST provided pt/Son handouts on general REFLUX precautions, diet preparation and options, and suggestions for further consults.  D/t pt's overall presentation w/ po's today, recommend age-appropriate regular diet w/ thin liquids; general aspiration precautions and full REFLUX precautions; whole meds ina Puree if needed for easier swallowing. Recommend GI consult for assessment and management of REFLUX symptoms. Son/pt agreed. No further skilled ST services are indicated at this time, however, ST  can be available to provide education while admitted. NSG will reconsult if change in status occurs while admitted. NSG/MD updated.   SLP Visit Diagnosis: Dysphagia, pharyngoesophageal phase (R13.14)(suspect per Son's report)    Aspiration  Risk  Reduced aspiration risk from an oropharyngeal phase standpoint; min increased risk of aspiration of REFLUX, regurgitated material from a GI standpoint    Diet Recommendation   Age-appropriate regular diet w/ thin liquids; moistened foods; less meats/breads; general aspiration precautions; full REFLUX precautions.   Medication Administration: Whole meds with puree(if needed)    Other  Recommendations Recommended Consults: Consider GI evaluation Oral Care Recommendations: Oral care BID;Patient independent with oral care   Follow up Recommendations None      Frequency and Duration  n/a for ST serivces         Prognosis Prognosis for Safe Diet Advancement: Good      Swallow Study   General Date of Onset: 05/17/17 HPI: Pt is a 83 y/o M who presented with cough and some SOB.  Pt found to have UTI the day prior and sent home with antibiotics. Pt's past med hx includes AAA, prostate cancer, and GI bleed.  Type of Study: Bedside Swallow Evaluation Previous Swallow Assessment: several years ago per Son  Diet Prior to this Study: Regular;Thin liquids Temperature Spikes Noted: Yes(pt diagnosed w/ UTI; wbc WNL) Respiratory Status: Room air History of Recent Intubation: No Behavior/Cognition: Alert;Cooperative;Pleasant mood;Distractible;Requires cueing Oral Cavity Assessment: Within Functional Limits Oral Care Completed by SLP: Recent completion by staff Oral Cavity - Dentition: Adequate natural dentition Vision: Functional for self-feeding Self-Feeding Abilities: Able to feed self;Needs set up Patient Positioning: Upright in chair Baseline Vocal Quality: Normal Volitional Cough: Strong Volitional Swallow: Able to elicit    Oral/Motor/Sensory Function Overall Oral Motor/Sensory Function: Within functional limits   Ice Chips Ice chips: Not tested   Thin Liquid Thin Liquid: Within functional limits Presentation: Cup;Self Fed;Straw(4 oz )    Nectar Thick Nectar Thick Liquid: Not  tested   Honey Thick Honey Thick Liquid: Not tested   Puree Puree: Within functional limits Presentation: Spoon;Self Fed(2 oz)   Solid   GO   Solid: Within functional limits Presentation: Self Fed(3 trials)       Carolynn Sayers, SLP-Graduate Student Carolynn Sayers 05/17/2017,3:32 PM    The information in this patient note, response to treatment, and overall treatment plan developed has been reviewed and agreed upon by this clinician.  Orinda Kenner, [place], [place] [phone] 05/17/17,5:13 PM

## 2017-05-18 ENCOUNTER — Other Ambulatory Visit: Payer: Self-pay

## 2017-05-18 LAB — BLOOD CULTURE ID PANEL (REFLEXED)

## 2017-05-18 LAB — BASIC METABOLIC PANEL
Anion gap: 8 (ref 5–15)
BUN: 53 mg/dL — AB (ref 6–20)
CALCIUM: 8.1 mg/dL — AB (ref 8.9–10.3)
CHLORIDE: 107 mmol/L (ref 101–111)
CO2: 20 mmol/L — ABNORMAL LOW (ref 22–32)
CREATININE: 2.7 mg/dL — AB (ref 0.61–1.24)
GFR calc non Af Amer: 19 mL/min — ABNORMAL LOW (ref >60)
GFR, EST AFRICAN AMERICAN: 22 mL/min — AB (ref >60)
Glucose, Bld: 208 mg/dL — ABNORMAL HIGH (ref 65–99)
Potassium: 4.7 mmol/L (ref 3.5–5.1)
SODIUM: 135 mmol/L (ref 135–145)

## 2017-05-18 LAB — URINE CULTURE

## 2017-05-18 LAB — CBC
HCT: 27.2 % — ABNORMAL LOW (ref 40.0–52.0)
Hemoglobin: 9 g/dL — ABNORMAL LOW (ref 13.0–18.0)
MCH: 27.7 pg (ref 26.0–34.0)
MCHC: 33.1 g/dL (ref 32.0–36.0)
MCV: 83.7 fL (ref 80.0–100.0)
PLATELETS: 89 10*3/uL — AB (ref 150–440)
RBC: 3.25 MIL/uL — ABNORMAL LOW (ref 4.40–5.90)
RDW: 17.5 % — AB (ref 11.5–14.5)
WBC: 11.3 10*3/uL — AB (ref 3.8–10.6)

## 2017-05-18 LAB — GLUCOSE, CAPILLARY
GLUCOSE-CAPILLARY: 182 mg/dL — AB (ref 65–99)
Glucose-Capillary: 176 mg/dL — ABNORMAL HIGH (ref 65–99)
Glucose-Capillary: 198 mg/dL — ABNORMAL HIGH (ref 65–99)
Glucose-Capillary: 133 mg/dL — ABNORMAL HIGH (ref 65–99)

## 2017-05-18 MED ORDER — SODIUM CHLORIDE 0.9 % IV SOLN
INTRAVENOUS | Status: DC
  Administered 2017-05-18 – 2017-05-19 (×3): via INTRAVENOUS

## 2017-05-18 MED ORDER — VANCOMYCIN HCL IN DEXTROSE 1-5 GM/200ML-% IV SOLN
1000.0000 mg | INTRAVENOUS | Status: DC
  Filled 2017-05-18: qty 200

## 2017-05-18 MED ORDER — VANCOMYCIN HCL IN DEXTROSE 1-5 GM/200ML-% IV SOLN
1000.0000 mg | Freq: Once | INTRAVENOUS | Status: DC
  Filled 2017-05-18: qty 200

## 2017-05-18 MED ORDER — PANTOPRAZOLE SODIUM 40 MG PO TBEC
40.0000 mg | DELAYED_RELEASE_TABLET | Freq: Every day | ORAL | Status: DC
  Administered 2017-05-18 – 2017-05-19 (×2): 40 mg via ORAL
  Filled 2017-05-18 (×2): qty 1

## 2017-05-18 NOTE — Progress Notes (Signed)
[facility] NAME: Blake [name]    MR#:  [id number]  DATE OF BIRTH:  10-13-1922  SUBJECTIVE:  CHIEF COMPLAINT:   Chief Complaint  Patient presents with  . Shortness of Breath   Feels better today.  Sitting up in bed and eating.  Still feels weak.  REVIEW OF SYSTEMS:    Review of Systems  Constitutional: Positive for malaise/fatigue. Negative for chills and fever.  HENT: Negative for sore throat.   Eyes: Negative for blurred vision, double vision and pain.  Respiratory: Negative for cough, hemoptysis, shortness of breath and wheezing.   Cardiovascular: Negative for chest pain, palpitations, orthopnea and leg swelling.  Gastrointestinal: Negative for abdominal pain, constipation, diarrhea, heartburn, nausea and vomiting.  Genitourinary: Negative for dysuria and hematuria.  Musculoskeletal: Negative for back pain and joint pain.  Skin: Negative for rash.  Neurological: Positive for weakness. Negative for sensory change, speech change, focal weakness and headaches.  Endo/Heme/Allergies: Does not bruise/bleed easily.  Psychiatric/Behavioral: Negative for depression. The patient is not nervous/anxious.     DRUG ALLERGIES:   Allergies  Allergen Reactions  . Diovan [Valsartan] Cough  . Hydralazine Itching  . Lisinopril Cough    VITALS:  Blood pressure 116/71, pulse 69, temperature 97.9 F (36.6 C), temperature source Oral, resp. rate 18, height 6\' 2"  (1.88 m), weight 103 kg (227 lb), SpO2 96 %.  PHYSICAL EXAMINATION:   Physical Exam  GENERAL:  83 y.o.-year-old patient lying in the bed with no acute distress.  EYES: Pupils equal, round, reactive to light and accommodation. No scleral icterus. Extraocular muscles intact.  HEENT: Head atraumatic, normocephalic. Oropharynx and nasopharynx clear.  NECK:  Supple, no jugular venous distention. No thyroid enlargement, no tenderness.  LUNGS: Normal breath sounds bilaterally, no  wheezing, rales, rhonchi. No use of accessory muscles of respiration.  CARDIOVASCULAR: S1, S2 normal. No murmurs, rubs, or gallops.  ABDOMEN: Soft, nontender, nondistended. Bowel sounds present. No organomegaly or mass.  EXTREMITIES: No cyanosis, clubbing or edema b/l.    NEUROLOGIC: Cranial nerves II through XII are intact. No focal Motor or sensory deficits b/l.   PSYCHIATRIC: The patient is alert and oriented x 3.  SKIN: No obvious rash, lesion, or ulcer.   LABORATORY PANEL:   CBC Recent Labs  Lab 05/18/17 0426  WBC 11.3*  HGB 9.0*  HCT 27.2*  PLT 89*   ------------------------------------------------------------------------------------------------------------------ Chemistries  Recent Labs  Lab 05/17/17 0611 05/18/17 0426  NA 134* 135  K 4.2 4.7  CL 103 107  CO2 20* 20*  GLUCOSE 94 208*  BUN 49* 53*  CREATININE 3.14* 2.70*  CALCIUM 8.3* 8.1*  AST 31  --   ALT 23  --   ALKPHOS 105  --   BILITOT 1.2  --    ------------------------------------------------------------------------------------------------------------------  Cardiac Enzymes No results for input(s): TROPONINI in the last 168 hours. ------------------------------------------------------------------------------------------------------------------  RADIOLOGY:  Dg Chest 2 View  Result Date: 05/17/2017 CLINICAL DATA:  Shortness of breath for 3 days.  Cardiac arrhythmia. EXAM: CHEST  2 VIEW COMPARISON:  03/31/2017 FINDINGS: Heart size remains normal. Dual lead transvenous pacemaker in appropriate position. Insert aorta Previously seen infiltrate in the left lung base has resolved. Mild bibasilar scarring noted. No evidence of pleural effusion. IMPRESSION: No active cardiopulmonary disease. Electronically Signed   By: Earle Gell M.D.   On: 05/17/2017 07:44     ASSESSMENT AND PLAN:   * UTI with sepsis * AKI over CKD 3 *  Mild hematuria- resolved * DM * HTN  Patient on IV ceftriaxone.  Sepsis resolved.   Cultures pending.  Acute kidney injury improving.  Baseline creatinine 2-2.5. On sliding scale insulin and glipizide.  On antihypertensives.  Likely discharge in 1-2 days.  All the records are reviewed and case discussed with Care Management/Social Worker Management plans discussed with the patient, family and they are in agreement.  CODE STATUS: FULL CODE  DVT Prophylaxis: SCDs  TOTAL TIME TAKING CARE OF THIS PATIENT: 35 minutes.   Hillary Bow R M.D on 05/18/2017 at 12:46 PM  Between 7am to 6pm - Pager - [phone]  After 6pm go to [email or web address] - password [email or web address] [facility]  [facility] [place] [facility]  Office  [phone]  CC: Primary care physician; Adin Hector, MD  Note: This dictation was prepared with Dragon dictation along with smaller phrase technology. Any transcriptional errors that result from this process are unintentional.

## 2017-05-18 NOTE — Progress Notes (Signed)
PHARMACY - PHYSICIAN COMMUNICATION CRITICAL VALUE ALERT - BLOOD CULTURE IDENTIFICATION (BCID)  Results for orders placed or performed during the hospital encounter of 05/17/17  Blood Culture ID Panel (Reflexed) (Collected: 05/17/2017  6:12 AM)  Result Value Ref Range   Enterococcus species NOT DETECTED NOT DETECTED   Listeria monocytogenes NOT DETECTED NOT DETECTED   Staphylococcus species DETECTED (A) NOT DETECTED   Staphylococcus aureus NOT DETECTED NOT DETECTED   Methicillin resistance DETECTED (A) NOT DETECTED   Streptococcus species NOT DETECTED NOT DETECTED   Streptococcus agalactiae NOT DETECTED NOT DETECTED   Streptococcus pneumoniae NOT DETECTED NOT DETECTED   Streptococcus pyogenes NOT DETECTED NOT DETECTED   Acinetobacter baumannii NOT DETECTED NOT DETECTED   Enterobacteriaceae species NOT DETECTED NOT DETECTED   Enterobacter cloacae complex NOT DETECTED NOT DETECTED   Escherichia coli NOT DETECTED NOT DETECTED   Klebsiella oxytoca NOT DETECTED NOT DETECTED   Klebsiella pneumoniae NOT DETECTED NOT DETECTED   Proteus species NOT DETECTED NOT DETECTED   Serratia marcescens NOT DETECTED NOT DETECTED   Haemophilus influenzae NOT DETECTED NOT DETECTED   Neisseria meningitidis NOT DETECTED NOT DETECTED   Pseudomonas aeruginosa NOT DETECTED NOT DETECTED   Candida albicans NOT DETECTED NOT DETECTED   Candida glabrata NOT DETECTED NOT DETECTED   Candida krusei NOT DETECTED NOT DETECTED   Candida parapsilosis NOT DETECTED NOT DETECTED   Candida tropicalis NOT DETECTED NOT DETECTED    Name of physician (or Provider) Contacted: Rosilyn Mings  Changes to prescribed antibiotics required: Patient on rocephin for UTI, will switch to vanc for coverage of MRSA. Vanc 1g IV x 1 F/u vanc 1g IV q24h w/ 8 hr stack dose. Will draw vanc trough 11/9 @ 1200 prior to 3rd dose.  Ke 0.0229 T1/2 30 ~ 24 hrs and decrease dose to 1g Goal trough 15 - 20 mcg/mL  Tobie Lords, PharmD,  BCPS Clinical Pharmacist 05/18/2017

## 2017-05-19 LAB — GLUCOSE, CAPILLARY
GLUCOSE-CAPILLARY: 111 mg/dL — AB (ref 65–99)
GLUCOSE-CAPILLARY: 135 mg/dL — AB (ref 65–99)

## 2017-05-19 LAB — CULTURE, BLOOD (ROUTINE X 2): Special Requests: ADEQUATE

## 2017-05-19 LAB — BASIC METABOLIC PANEL
Anion gap: 7 (ref 5–15)
BUN: 57 mg/dL — AB (ref 6–20)
CALCIUM: 7.8 mg/dL — AB (ref 8.9–10.3)
CO2: 20 mmol/L — AB (ref 22–32)
Chloride: 112 mmol/L — ABNORMAL HIGH (ref 101–111)
Creatinine, Ser: 2.53 mg/dL — ABNORMAL HIGH (ref 0.61–1.24)
GFR calc Af Amer: 24 mL/min — ABNORMAL LOW (ref >60)
GFR, EST NON AFRICAN AMERICAN: 21 mL/min — AB (ref >60)
GLUCOSE: 113 mg/dL — AB (ref 65–99)
Potassium: 4.3 mmol/L (ref 3.5–5.1)
Sodium: 139 mmol/L (ref 135–145)

## 2017-05-19 MED ORDER — CEPHALEXIN 250 MG PO CAPS: 250.0000 mg | ORAL_CAPSULE | Freq: Three times a day (TID) | ORAL | 0 refills | Status: AC

## 2017-05-19 NOTE — Care Management Important Message (Signed)
Important Message  Patient Details  Name: JAYCOB MCCLENTON MRN: [id number] Date of Birth: 04/30/1923   Medicare Important Message Given:  Yes    Shelbie Ammons, RN 05/19/2017, 8:16 AM

## 2017-05-19 NOTE — Discharge Instructions (Signed)
Resume diet and activity as before ° ° °

## 2017-05-19 NOTE — Progress Notes (Signed)
Physical Therapy Treatment Patient Details Name: Blake [name] MRN: [id number] DOB: [birth year] Today's Date: 05/19/2017    History of Present Illness Pt is a 83 y/o M who presented with cough and some SOB.  Pt found to have UTI the day prior and sent home with antibiotics. Pt's PMH includes AAA, prostate cancer.    PT Comments    Pt inc urine.  Assisted with care as appropriate and new pad donned for gait.  Bed mobility with min assist.  PT was able to stand and ambulate 160' with walker and min assist.  Overall does well with no LOB or buckling but does have a forward flexed posture and relies heavily on walker.  Son in attendance and stated mobility is at or near baseline.   Follow Up Recommendations  Outpatient PT;Supervision/Assistance - 24 hour     Equipment Recommendations  None recommended by PT    Recommendations for Other Services       Precautions / Restrictions Precautions Precautions: Fall Restrictions Weight Bearing Restrictions: No    Mobility  Bed Mobility Overal bed mobility: Needs Assistance Bed Mobility: Supine to Sit     Supine to sit: Min assist     General bed mobility comments: Min assist to bring LEs OOB, into bed  Transfers Overall transfer level: Needs assistance Equipment used: Rolling walker (2 wheeled) Transfers: Sit to/from Stand Sit to Stand: Min guard         General transfer comment: Cues for proper hand placement.  Pt slow but steady when rising.  Cues to reach back for bed when preparing to sit.   Ambulation/Gait Ambulation/Gait assistance: Min guard Ambulation Distance (Feet): 160 Feet Assistive device: Rolling walker (2 wheeled) Gait Pattern/deviations: Step-through pattern;Trunk flexed   Gait velocity interpretation: at or above normal speed for age/gender General Gait Details: Son stated pt is at baseline with mobility.   Stairs            Wheelchair Mobility    Modified Rankin (Stroke Patients Only)        Balance Overall balance assessment: Needs assistance Sitting-balance support: No upper extremity supported;Feet supported Sitting balance-Leahy Scale: Good     Standing balance support: Single extremity supported;During functional activity Standing balance-Leahy Scale: Poor Standing balance comment: Pt relies on UE support for static and dynamic activities                            Cognition Arousal/Alertness: Awake/alert Behavior During Therapy: WFL for tasks assessed/performed Overall Cognitive Status: Within Functional Limits for tasks assessed                                        Exercises      General Comments        Pertinent Vitals/Pain Pain Assessment: No/denies pain    Home Living                      Prior Function            PT Goals (current goals can now be found in the care plan section) Progress towards PT goals: Progressing toward goals    Frequency           PT Plan Current plan remains appropriate    Co-evaluation  AM-PAC PT "6 Clicks" Daily Activity  Outcome Measure  Difficulty turning over in bed (including adjusting bedclothes, sheets and blankets)?: A Lot Difficulty moving from lying on back to sitting on the side of the bed? : A Lot Difficulty sitting down on and standing up from a chair with arms (e.g., wheelchair, bedside commode, etc,.)?: Unable Help needed moving to and from a bed to chair (including a wheelchair)?: A Little Help needed walking in hospital room?: A Little Help needed climbing 3-5 steps with a railing? : A Little 6 Click Score: 14    End of Session Equipment Utilized During Treatment: Gait belt Activity Tolerance: Patient tolerated treatment well Patient left: in chair;with chair alarm set;with call bell/phone within reach;with family/visitor present         Time: 6349-4944 PT Time Calculation (min) (ACUTE ONLY): 23 min  Charges:  $Gait  Training: 23-37 mins                    G Codes:       Chesley Noon, PTA 05/19/17, 11:05 AM

## 2017-05-19 NOTE — Progress Notes (Signed)
Medications administered by student RN [phone] with supervision of Clinical Instructor Lynann Bologna MSN, RN-BC or patient's assigned RN.

## 2017-05-19 NOTE — Progress Notes (Signed)
Discharge instructions along with home medications and follow up gone over with patient and son. Both verbalize that they understood instructions. No prescriptions given to patient. IV removed. Pt being discharged home on room air, no distress noted. Cleota Pellerito S Fenton, RN 

## 2017-05-22 LAB — CULTURE, BLOOD (ROUTINE X 2): Culture: NO GROWTH

## 2017-05-24 NOTE — Discharge Summary (Signed)
[facility] NAME: Blake [name]    MR#:  [id number]  DATE OF BIRTH:  22-Mar-1923  DATE OF ADMISSION:  05/17/2017 ADMITTING PHYSICIAN: Hillary Bow, MD  DATE OF DISCHARGE: 05/19/2017 11:49 AM  PRIMARY CARE PHYSICIAN: Adin Hector, MD   ADMISSION DIAGNOSIS:  Shortness of breath [R06.02] Lactic acidosis [E87.2] Acute cystitis with hematuria [N30.01] Sepsis, due to unspecified organism ([place]) [A41.9]  DISCHARGE DIAGNOSIS:  Active Problems:   UTI (urinary tract infection)   SECONDARY DIAGNOSIS:   Past Medical History:  Diagnosis Date  . AAA (abdominal aortic aneurysm) ([place])   . Arrhythmia   . Diabetes mellitus without complication ([place])   . GI bleed   . Heart murmur   . Hyperlipidemia   . Hypertension   . Prostate cancer [facility])      ADMITTING HISTORY  Blake [name]  is a 83 y.o. male with a known history of DM, HTN, CKD3 here with not feeling well, cough and some SOB. Was given levquin by his urologist Dr. Jacqlyn Larsen yesterday and got 1 dose. Today he has UTI with lactic acid 3.5. AKi Baseline Cr 2-2.5. Patient was confused earlier but now back to baseline per family    HOSPITAL COURSE:   * UTI with sepsis * AKI over CKD 3 * Mild hematuria- resolved * DM * HTN  Patient admitted to medical floor.  Started on IV fluids and IV ceftriaxone.  Blood and urine cultures sent.  Urine cultures returned with multiple organisms with likely contamination.  Blood culture 1 out of 2 bottles was positive for methicillin-resistant coag-negative staph.  This was likely contaminant.  Through the hospital stay patient improved well.  By the day of discharge his acute kidney injury has resolved.  He is eating well.  Ambulated in the hallway with physical therapy and home health is being set up.  Patient will be switched to Keflex to finish antibiotic course and follow-up with his urologist Dr. Jacqlyn Larsen.  Stable for discharge.  CONSULTS OBTAINED:     DRUG ALLERGIES:   Allergies  Allergen Reactions  . Diovan [Valsartan] Cough  . Hydralazine Itching  . Lisinopril Cough    DISCHARGE MEDICATIONS:   Discharge Medication List as of 05/19/2017 11:11 AM    START taking these medications   Details  cephALEXin (KEFLEX) 250 MG capsule Take 1 capsule (250 mg total) 3 (three) times daily for 5 days by mouth., Starting Thu 05/19/2017, Until Tue 05/24/2017, Normal      CONTINUE these medications which have NOT CHANGED   Details  amLODipine (NORVASC) 5 MG tablet Take 5 mg by mouth daily., Historical Med    finasteride (PROSCAR) 5 MG tablet Take 5 mg by mouth daily., Historical Med    glimepiride (AMARYL) 2 MG tablet 2 tablets in the morning and one tablet at night, No Print    guaiFENesin-dextromethorphan (ROBITUSSIN DM) 100-10 MG/5ML syrup Take 5 mLs by mouth every 4 (four) hours as needed for cough., Starting Sun 04/03/2017, Normal    metoprolol tartrate (LOPRESSOR) 25 MG tablet Take 0.5 tablets (12.5 mg total) by mouth 2 (two) times daily., Starting Wed 10/13/2016, Print    Multiple Vitamins-Minerals (PRESERVISION/LUTEIN PO) Take 1 capsule by mouth 2 (two) times daily., Historical Med    ranitidine (ZANTAC) 150 MG tablet Take 150 mg by mouth 2 (two) times daily., Historical Med    rosuvastatin (CRESTOR) 5 MG tablet Take 5 mg by mouth at bedtime., Historical Med  sitaGLIPtin (JANUVIA) 50 MG tablet Take 50 mg by mouth daily., Historical Med    tamsulosin (FLOMAX) 0.4 MG CAPS capsule Take 0.4 mg by mouth at bedtime., Historical Med    triamcinolone cream (KENALOG) 0.5 % Apply 1 application topically 2 (two) times daily., Historical Med    azelastine (ASTELIN) 0.1 % nasal spray Place 1 spray into both nostrils 2 (two) times daily. Use in each nostril as directed, Historical Med      STOP taking these medications     levofloxacin (LEVAQUIN) 500 MG tablet         Today   VITAL SIGNS:  Blood pressure 113/66, pulse 75,  temperature 97.9 F (36.6 C), temperature source Oral, resp. rate 16, height 6\' 2"  (1.88 m), weight 98 kg (216 lb), SpO2 97 %.  I/O:  No intake or output data in the 24 hours ending 05/24/17 1507  PHYSICAL EXAMINATION:  Physical Exam  GENERAL:  83 y.o.-year-old patient lying in the bed with no acute distress.  LUNGS: Normal breath sounds bilaterally, no wheezing, rales,rhonchi or crepitation. No use of accessory muscles of respiration.  CARDIOVASCULAR: S1, S2 normal. No murmurs, rubs, or gallops.  ABDOMEN: Soft, non-tender, non-distended. Bowel sounds present. No organomegaly or mass.  NEUROLOGIC: Moves all 4 extremities. PSYCHIATRIC: The patient is alert and oriented x 3.  SKIN: No obvious rash, lesion, or ulcer.   DATA REVIEW:   CBC Recent Labs  Lab 05/18/17 0426  WBC 11.3*  HGB 9.0*  HCT 27.2*  PLT 89*    Chemistries  Recent Labs  Lab 05/19/17 0601  NA 139  K 4.3  CL 112*  CO2 20*  GLUCOSE 113*  BUN 57*  CREATININE 2.53*  CALCIUM 7.8*    Cardiac Enzymes No results for input(s): TROPONINI in the last 168 hours.  Microbiology Results  Results for orders placed or performed during the hospital encounter of 05/17/17  Culture, blood (Routine x 2)     Status: None   Collection Time: 05/17/17  6:12 AM  Result Value Ref Range Status   Specimen Description BLOOD RIGHT FA  Final   Special Requests BOTTLES DRAWN AEROBIC AND ANAEROBIC BCAV  Final   Culture NO GROWTH 5 DAYS  Final   Report Status 05/22/2017 FINAL  Final  Culture, blood (Routine x 2)     Status: Abnormal   Collection Time: 05/17/17  6:12 AM  Result Value Ref Range Status   Specimen Description BLOOD LEFT WRIST  Final   Special Requests   Final    BOTTLES DRAWN AEROBIC AND ANAEROBIC Blood Culture adequate volume   Culture  Setup Time   Final    GRAM POSITIVE COCCI AEROBIC BOTTLE ONLY CRITICAL RESULT CALLED TO, READ BACK BY AND VERIFIED WITH: DAVID BESANTI AT 7829 05/18/17 SDR    Culture (A)   Final    STAPHYLOCOCCUS SPECIES (COAGULASE NEGATIVE) THE SIGNIFICANCE OF ISOLATING THIS ORGANISM FROM A SINGLE SET OF BLOOD CULTURES WHEN MULTIPLE SETS ARE DRAWN IS UNCERTAIN. PLEASE NOTIFY THE MICROBIOLOGY DEPARTMENT WITHIN ONE WEEK IF SPECIATION AND SENSITIVITIES ARE REQUIRED. Performed at [facility], [place] [address] [place], [place] [address]    Report Status 05/19/2017 FINAL  Final  Urine culture     Status: Abnormal   Collection Time: 05/17/17  6:12 AM  Result Value Ref Range Status   Specimen Description URINE, RANDOM  Final   Special Requests NONE  Final   Culture MULTIPLE SPECIES PRESENT, SUGGEST RECOLLECTION (A)  Final  Report Status 05/18/2017 FINAL  Final  Blood Culture ID Panel (Reflexed)     Status: Abnormal   Collection Time: 05/17/17  6:12 AM  Result Value Ref Range Status   Enterococcus species NOT DETECTED NOT DETECTED Final   Listeria monocytogenes NOT DETECTED NOT DETECTED Final   Staphylococcus species DETECTED (A) NOT DETECTED Final    Comment: Methicillin (oxacillin) resistant coagulase negative staphylococcus. Possible blood culture contaminant (unless isolated from more than one blood culture draw or clinical case suggests pathogenicity). No antibiotic treatment is indicated for blood  culture contaminants. CRITICAL RESULT CALLED TO, READ BACK BY AND VERIFIED WITH:  DAVID BESANTI AT 9381 05/18/17 SDR    Staphylococcus aureus NOT DETECTED NOT DETECTED Final   Methicillin resistance DETECTED (A) NOT DETECTED Final    Comment: CRITICAL RESULT CALLED TO, READ BACK BY AND VERIFIED WITH:  DAVID BESANTI AT 0626 05/18/17 SDR    Streptococcus species NOT DETECTED NOT DETECTED Final   Streptococcus agalactiae NOT DETECTED NOT DETECTED Final   Streptococcus pneumoniae NOT DETECTED NOT DETECTED Final   Streptococcus pyogenes NOT DETECTED NOT DETECTED Final   Acinetobacter baumannii NOT DETECTED NOT DETECTED Final   Enterobacteriaceae species NOT DETECTED NOT  DETECTED Final   Enterobacter cloacae complex NOT DETECTED NOT DETECTED Final   Escherichia coli NOT DETECTED NOT DETECTED Final   Klebsiella oxytoca NOT DETECTED NOT DETECTED Final   Klebsiella pneumoniae NOT DETECTED NOT DETECTED Final   Proteus species NOT DETECTED NOT DETECTED Final   Serratia marcescens NOT DETECTED NOT DETECTED Final   Haemophilus influenzae NOT DETECTED NOT DETECTED Final   Neisseria meningitidis NOT DETECTED NOT DETECTED Final   Pseudomonas aeruginosa NOT DETECTED NOT DETECTED Final   Candida albicans NOT DETECTED NOT DETECTED Final   Candida glabrata NOT DETECTED NOT DETECTED Final   Candida krusei NOT DETECTED NOT DETECTED Final   Candida parapsilosis NOT DETECTED NOT DETECTED Final   Candida tropicalis NOT DETECTED NOT DETECTED Final    RADIOLOGY:  No results found.  Follow up with PCP in 1 week.  Management plans discussed with the patient, family and they are in agreement.  CODE STATUS:  Code Status History    Date Active Date Inactive Code Status Order ID Comments User Context   05/17/2017 08:29 05/19/2017 15:25 Full Code [id number]  Hillary Bow, MD ED   03/31/2017 13:34 04/03/2017 16:11 Full Code [id number]  Nicholes Mango, MD Inpatient   10/21/2016 01:55 10/25/2016 21:45 Full Code [id number]  Harvie Bridge, DO Inpatient   10/09/2016 23:42 10/13/2016 20:09 Full Code [id number]  Vaughan Basta, MD Inpatient   04/14/2015 14:32 04/15/2015 17:39 Full Code [id number]  Isaias Cowman, MD Inpatient   04/09/2015 00:22 04/14/2015 14:32 Full Code [id number]  Hower, Aaron Mose, MD ED    Advance Directive Documentation     Most Recent Value  Type of Advance Directive  Healthcare Power of Attorney  Pre-existing out of facility DNR order (yellow form or pink MOST form)  No data  "MOST" Form in Place?  No data      TOTAL TIME TAKING CARE OF THIS PATIENT ON DAY OF DISCHARGE: more than 30 minutes.   Hillary Bow R M.D on 05/24/2017 at 3:07 PM  Between 7am  to 6pm - Pager - [phone]  After 6pm go to [email or web address] - password [email or web address] [facility]  [facility]  Office  [phone]  CC: Primary care physician; Adin Hector, MD  Note: This dictation was prepared with Dragon dictation along with smaller  Company secretary. Any transcriptional errors that result from this process are unintentional.

## 2017-05-31 ENCOUNTER — Ambulatory Visit (INDEPENDENT_AMBULATORY_CARE_PROVIDER_SITE_OTHER): Payer: Medicare Other

## 2017-05-31 ENCOUNTER — Encounter (INDEPENDENT_AMBULATORY_CARE_PROVIDER_SITE_OTHER): Payer: Self-pay | Admitting: Vascular Surgery

## 2017-05-31 ENCOUNTER — Ambulatory Visit (INDEPENDENT_AMBULATORY_CARE_PROVIDER_SITE_OTHER): Payer: Medicare Other | Admitting: Vascular Surgery

## 2017-05-31 VITALS — BP 116/71 | HR 77 | Resp 16 | Wt 230.0 lb

## 2017-05-31 DIAGNOSIS — I714 Abdominal aortic aneurysm, without rupture, unspecified: Secondary | ICD-10-CM

## 2017-05-31 DIAGNOSIS — E118 Type 2 diabetes mellitus with unspecified complications: Secondary | ICD-10-CM | POA: Diagnosis not present

## 2017-05-31 DIAGNOSIS — E785 Hyperlipidemia, unspecified: Secondary | ICD-10-CM | POA: Diagnosis not present

## 2017-05-31 NOTE — Progress Notes (Signed)
Subjective:    Patient ID: Blake [name], male    DOB: 06/11/1923, 83 y.o.   MRN: [id number] Chief Complaint  Patient presents with  . Follow-up    19mo AAA   Patient presents to review vascular studies.  He was last seen approximately 6 months ago.  He presents today without complaint with the exception of a recent bout of shingles.  The patient denies any abdominal or back pain.  The patient underwent an abdominal aortic aneurysm duplex exam which was notable for an abdominal aortic aneurysm measuring 5.1 cm x 4.8 cm.  When compared to the previous exam on Nov 18, 2016 the abdominal aorta aneurysm has remained stable.  Patient denies any fever, nausea vomiting.   Review of Systems  Constitutional: Negative.   HENT: Negative.   Eyes: Negative.   Respiratory: Negative.   Cardiovascular:       AAA  Gastrointestinal: Negative.   Endocrine: Negative.   Genitourinary: Negative.   Musculoskeletal: Negative.   Skin: Negative.   Allergic/Immunologic: Negative.   Neurological: Negative.   Hematological: Negative.   Psychiatric/Behavioral: Negative.       Objective:   Physical Exam  Constitutional: He is oriented to person, place, and time. He appears well-developed and well-nourished. No distress.  HENT:  Head: Normocephalic and atraumatic.  Eyes: Conjunctivae are normal. Pupils are equal, round, and reactive to light.  Neck: Normal range of motion.  Cardiovascular: Normal rate, regular rhythm, normal heart sounds and intact distal pulses.  Pulses:      Radial pulses are 2+ on the right side, and 2+ on the left side.       Dorsalis pedis pulses are 1+ on the right side, and 1+ on the left side.       Posterior tibial pulses are 1+ on the right side, and 1+ on the left side.  Pulmonary/Chest: Effort normal and breath sounds normal.  Abdominal: Soft. Bowel sounds are normal.  Musculoskeletal: Normal range of motion. He exhibits no edema.  Neurological: He is alert and oriented to  person, place, and time.  Skin: Skin is warm and dry. He is not diaphoretic.  Psychiatric: He has a normal mood and affect. His behavior is normal. Judgment and thought content normal.  Vitals reviewed.  BP 116/71 (BP Location: Left Arm)   Pulse 77   Resp 16   Wt 230 lb (104.3 kg)   BMI 29.53 kg/m   Past Medical History:  Diagnosis Date  . AAA (abdominal aortic aneurysm) ([place])   . Arrhythmia   . Diabetes mellitus without complication ([place])   . GI bleed   . Heart murmur   . Hyperlipidemia   . Hypertension   . Prostate cancer [facility])    Social History   Socioeconomic History  . Marital status: Married    Spouse name: Not on file  . Number of children: Not on file  . Years of education: Not on file  . Highest education level: Not on file  Social Needs  . Financial resource strain: Not on file  . Food insecurity - worry: Not on file  . Food insecurity - inability: Not on file  . Transportation needs - medical: Not on file  . Transportation needs - non-medical: Not on file  Occupational History  . Not on file  Tobacco Use  . Smoking status: Former Research scientist (life sciences)  . Smokeless tobacco: Never Used  Substance and Sexual Activity  . Alcohol use: No  . Drug use: No  .  Sexual activity: Not on file  Other Topics Concern  . Not on file  Social History Narrative  . Not on file   Past Surgical History:  Procedure Laterality Date  . FLEXIBLE SIGMOIDOSCOPY N/A 10/21/2016   Performed by [name], MD at [facility]  . INSERTION PACEMAKER Left 04/14/2015   Performed by Isaias Cowman, MD at [facility] ORS  . JOINT REPLACEMENT    . PROSTATE SURGERY    . Visceral Artery Intervention N/A 10/22/2016   Performed by Algernon Huxley, MD at [facility] INVASIVE CV LAB   Family History  Problem Relation Age of Onset  . Hypertension Other   . Stroke Other   . Heart attack Other   . Stroke Sister   . Heart attack Brother    Allergies  Allergen Reactions  . Diovan [Valsartan] Cough  .  Hydralazine Itching  . Lisinopril Cough      Assessment & Plan:  Patient presents to review vascular studies.  He was last seen approximately 6 months ago.  He presents today without complaint with the exception of a recent bout of shingles.  The patient denies any abdominal or back pain.  The patient underwent an abdominal aortic aneurysm duplex exam which was notable for an abdominal aortic aneurysm measuring 5.1 cm x 4.8 cm.  When compared to the previous exam on Nov 18, 2016 the abdominal aorta aneurysm has remained stable.  Patient denies any fever, nausea vomiting.  1. AAA (abdominal aortic aneurysm) without rupture ([place]) - Stable Patient presents today without complaint His physical exam is unremarkable Stable aortic aneurysm on duplex today Patient and his family do not want to move forward with any repair unless his aneurysm grows Patient is to follow-up in 6 months for a repeat duplex  - VAS [place] AAA DUPLEX; Future  2. Hyperlipidemia, unspecified hyperlipidemia type - Stable Encouraged good control as its slows the progression of atherosclerotic disease  3. Type 2 diabetes mellitus with complication, unspecified whether long term insulin use ([facility]) - Stable Encouraged good control as its slows the progression of atherosclerotic disease  Current Outpatient Medications on File Prior to Visit  Medication Sig Dispense Refill  . amLODipine (NORVASC) 5 MG tablet Take 5 mg by mouth daily.    [place] Kitchen azelastine (ASTELIN) 0.1 % nasal spray Place 1 spray into both nostrils 2 (two) times daily. Use in each nostril as directed    . finasteride (PROSCAR) 5 MG tablet Take 5 mg by mouth daily.    Mar[place] Kitchen glimepiride (AMARYL) 2 MG tablet 2 tablets in the morning and one tablet at night (Patient taking differently: Take 4 mg by mouth 2 (two) times daily. ) 90 tablet 11  . guaiFENesin-dextromethorphan (ROBITUSSIN DM) 100-10 MG/5ML syrup Take 5 mLs by mouth every 4 (four) hours as needed for cough. 118 mL 0  .  metoprolol tartrate (LOPRESSOR) 25 MG tablet Take 0.5 tablets (12.5 mg total) by mouth 2 (two) times daily. 30 tablet 2  . Multiple Vitamins-Minerals (PRESERVISION/LUTEIN PO) Take 1 capsule by mouth 2 (two) times daily.    . ranitidine (ZANTAC) 150 MG tablet Take 150 mg by mouth 2 (two) times daily.    . rosuvastatin (CRESTOR) 5 MG tablet Take 5 mg by mouth at bedtime.    . sitaGLIPtin (JANUVIA) 50 MG tablet Take 50 mg by mouth daily.    . tamsulosin (FLOMAX) 0.4 MG CAPS capsule Take 0.4 mg by mouth at bedtime.    . triamcinolone cream (KENALOG) 0.5 %  Apply 1 application topically 2 (two) times daily.     No current facility-administered medications on file prior to visit.    There are no Patient Instructions on file for this visit. No Follow-up on file.  Mckinley Adelstein A Cynithia Hakimi, PA-C

## 2017-06-07 DIAGNOSIS — A491 Streptococcal infection, unspecified site: Secondary | ICD-10-CM | POA: Insufficient documentation

## 2017-06-10 ENCOUNTER — Encounter: Payer: Self-pay | Admitting: Emergency Medicine

## 2017-06-10 ENCOUNTER — Emergency Department: Payer: Medicare Other

## 2017-06-10 ENCOUNTER — Inpatient Hospital Stay
Admission: EM | Admit: 2017-06-10 | Discharge: 2017-06-12 | DRG: 871 | Disposition: A | Discharge status: Home Health | Payer: Medicare Other | Attending: Specialist | Admitting: Specialist

## 2017-06-10 ENCOUNTER — Other Ambulatory Visit: Payer: Self-pay

## 2017-06-10 DIAGNOSIS — I129 Hypertensive chronic kidney disease with stage 1 through stage 4 chronic kidney disease, or unspecified chronic kidney disease: Secondary | ICD-10-CM | POA: Diagnosis present

## 2017-06-10 DIAGNOSIS — K219 Gastro-esophageal reflux disease without esophagitis: Secondary | ICD-10-CM | POA: Diagnosis present

## 2017-06-10 DIAGNOSIS — Z7984 Long term (current) use of oral hypoglycemic drugs: Secondary | ICD-10-CM

## 2017-06-10 DIAGNOSIS — I4891 Unspecified atrial fibrillation: Secondary | ICD-10-CM | POA: Diagnosis present

## 2017-06-10 DIAGNOSIS — A419 Sepsis, unspecified organism: Secondary | ICD-10-CM | POA: Diagnosis not present

## 2017-06-10 DIAGNOSIS — C679 Malignant neoplasm of bladder, unspecified: Secondary | ICD-10-CM | POA: Diagnosis present

## 2017-06-10 DIAGNOSIS — J189 Pneumonia, unspecified organism: Secondary | ICD-10-CM | POA: Diagnosis present

## 2017-06-10 DIAGNOSIS — Z87891 Personal history of nicotine dependence: Secondary | ICD-10-CM

## 2017-06-10 DIAGNOSIS — E1122 Type 2 diabetes mellitus with diabetic chronic kidney disease: Secondary | ICD-10-CM | POA: Diagnosis present

## 2017-06-10 DIAGNOSIS — N390 Urinary tract infection, site not specified: Secondary | ICD-10-CM | POA: Diagnosis present

## 2017-06-10 DIAGNOSIS — C61 Malignant neoplasm of prostate: Secondary | ICD-10-CM

## 2017-06-10 DIAGNOSIS — N400 Enlarged prostate without lower urinary tract symptoms: Secondary | ICD-10-CM | POA: Diagnosis present

## 2017-06-10 DIAGNOSIS — N183 Chronic kidney disease, stage 3 (moderate): Secondary | ICD-10-CM | POA: Diagnosis present

## 2017-06-10 DIAGNOSIS — Z8249 Family history of ischemic heart disease and other diseases of the circulatory system: Secondary | ICD-10-CM | POA: Diagnosis not present

## 2017-06-10 DIAGNOSIS — I10 Essential (primary) hypertension: Secondary | ICD-10-CM | POA: Diagnosis present

## 2017-06-10 DIAGNOSIS — Z79899 Other long term (current) drug therapy: Secondary | ICD-10-CM | POA: Diagnosis not present

## 2017-06-10 DIAGNOSIS — Z888 Allergy status to other drugs, medicaments and biological substances status: Secondary | ICD-10-CM | POA: Diagnosis not present

## 2017-06-10 DIAGNOSIS — E119 Type 2 diabetes mellitus without complications: Secondary | ICD-10-CM

## 2017-06-10 DIAGNOSIS — E785 Hyperlipidemia, unspecified: Secondary | ICD-10-CM | POA: Diagnosis present

## 2017-06-10 DIAGNOSIS — Z8546 Personal history of malignant neoplasm of prostate: Secondary | ICD-10-CM | POA: Diagnosis not present

## 2017-06-10 LAB — CBC WITH DIFFERENTIAL/PLATELET
Basophils Absolute: 0 10*3/uL (ref 0–0.1)
Basophils Relative: 0 %
EOS PCT: 1 %
Eosinophils Absolute: 0.1 10*3/uL (ref 0–0.7)
HCT: 33 % — ABNORMAL LOW (ref 40.0–52.0)
Hemoglobin: 10.8 g/dL — ABNORMAL LOW (ref 13.0–18.0)
LYMPHS ABS: 1.2 10*3/uL (ref 1.0–3.6)
LYMPHS PCT: 12 %
MCH: 27.7 pg (ref 26.0–34.0)
MCHC: 32.6 g/dL (ref 32.0–36.0)
MCV: 85 fL (ref 80.0–100.0)
MONO ABS: 0.8 10*3/uL (ref 0.2–1.0)
MONOS PCT: 8 %
Neutro Abs: 8.1 10*3/uL — ABNORMAL HIGH (ref 1.4–6.5)
Neutrophils Relative %: 79 %
PLATELETS: 170 10*3/uL (ref 150–440)
RBC: 3.89 MIL/uL — AB (ref 4.40–5.90)
RDW: 17.1 % — ABNORMAL HIGH (ref 11.5–14.5)
WBC: 10.3 10*3/uL (ref 3.8–10.6)

## 2017-06-10 LAB — COMPREHENSIVE METABOLIC PANEL
ALK PHOS: 98 U/L (ref 38–126)
ALT: 17 U/L (ref 17–63)
ANION GAP: 16 — AB (ref 5–15)
AST: 24 U/L (ref 15–41)
Albumin: 3.2 g/dL — ABNORMAL LOW (ref 3.5–5.0)
BILIRUBIN TOTAL: 0.7 mg/dL (ref 0.3–1.2)
BUN: 41 mg/dL — ABNORMAL HIGH (ref 6–20)
CALCIUM: 8.6 mg/dL — AB (ref 8.9–10.3)
CO2: 20 mmol/L — AB (ref 22–32)
CREATININE: 2.85 mg/dL — AB (ref 0.61–1.24)
Chloride: 98 mmol/L — ABNORMAL LOW (ref 101–111)
GFR calc Af Amer: 21 mL/min — ABNORMAL LOW (ref >60)
GFR calc non Af Amer: 18 mL/min — ABNORMAL LOW (ref >60)
Glucose, Bld: 193 mg/dL — ABNORMAL HIGH (ref 65–99)
Potassium: 4.5 mmol/L (ref 3.5–5.1)
SODIUM: 134 mmol/L — AB (ref 135–145)
TOTAL PROTEIN: 8.3 g/dL — AB (ref 6.5–8.1)

## 2017-06-10 LAB — PROTIME-INR
INR: 1.26
Prothrombin Time: 15.7 seconds — ABNORMAL HIGH (ref 11.4–15.2)

## 2017-06-10 LAB — URINALYSIS, COMPLETE (UACMP) WITH MICROSCOPIC
BILIRUBIN URINE: NEGATIVE
Glucose, UA: NEGATIVE mg/dL
HGB URINE DIPSTICK: NEGATIVE
Ketones, ur: NEGATIVE mg/dL
NITRITE: NEGATIVE
PROTEIN: 100 mg/dL — AB
Specific Gravity, Urine: 1.011 (ref 1.005–1.030)
pH: 5 (ref 5.0–8.0)

## 2017-06-10 LAB — GLUCOSE, CAPILLARY: Glucose-Capillary: 184 mg/dL — ABNORMAL HIGH (ref 65–99)

## 2017-06-10 LAB — LACTIC ACID, PLASMA
Lactic Acid, Venous: 4.8 mmol/L (ref 0.5–1.9)
Lactic Acid, Venous: 1.5 mmol/L (ref 0.5–1.9)

## 2017-06-10 MED ORDER — SODIUM CHLORIDE 0.9 % IV BOLUS (SEPSIS)
1000.0000 mL | Freq: Once | INTRAVENOUS | Status: AC
  Administered 2017-06-10: 1000 mL via INTRAVENOUS

## 2017-06-10 MED ORDER — PIPERACILLIN-TAZOBACTAM 3.375 G IVPB
INTRAVENOUS | Status: AC
  Filled 2017-06-10: qty 50

## 2017-06-10 MED ORDER — SODIUM CHLORIDE 0.9 % IV SOLN
INTRAVENOUS | Status: AC
  Administered 2017-06-10: via INTRAVENOUS

## 2017-06-10 MED ORDER — INSULIN ASPART 100 UNIT/ML ~~LOC~~ SOLN
0.0000 [IU] | Freq: Three times a day (TID) | SUBCUTANEOUS | Status: DC
  Administered 2017-06-11: 1 [IU] via SUBCUTANEOUS
  Administered 2017-06-11: 2 [IU] via SUBCUTANEOUS
  Administered 2017-06-12: 1 [IU] via SUBCUTANEOUS
  Filled 2017-06-10 (×3): qty 1

## 2017-06-10 MED ORDER — ACETAMINOPHEN 325 MG PO TABS
650.0000 mg | ORAL_TABLET | Freq: Four times a day (QID) | ORAL | Status: DC | PRN
  Administered 2017-06-12 (×2): 650 mg via ORAL
  Filled 2017-06-10 (×2): qty 2

## 2017-06-10 MED ORDER — ROSUVASTATIN CALCIUM 10 MG PO TABS
5.0000 mg | ORAL_TABLET | Freq: Every day | ORAL | Status: DC
  Administered 2017-06-10 – 2017-06-11 (×2): 5 mg via ORAL
  Filled 2017-06-10 (×2): qty 1

## 2017-06-10 MED ORDER — FINASTERIDE 5 MG PO TABS
5.0000 mg | ORAL_TABLET | Freq: Every day | ORAL | Status: DC
  Administered 2017-06-11 – 2017-06-12 (×2): 5 mg via ORAL
  Filled 2017-06-10 (×2): qty 1

## 2017-06-10 MED ORDER — ACETAMINOPHEN 650 MG RE SUPP
650.0000 mg | Freq: Once | RECTAL | Status: AC
  Administered 2017-06-10: 650 mg via RECTAL
  Filled 2017-06-10: qty 1

## 2017-06-10 MED ORDER — PANTOPRAZOLE SODIUM 40 MG PO TBEC
40.0000 mg | DELAYED_RELEASE_TABLET | Freq: Every day | ORAL | Status: DC
  Administered 2017-06-11 – 2017-06-12 (×2): 40 mg via ORAL
  Filled 2017-06-10 (×2): qty 1

## 2017-06-10 MED ORDER — ONDANSETRON HCL 4 MG/2ML IJ SOLN: 4.0000 mg | Freq: Four times a day (QID) | INTRAMUSCULAR | Status: DC | PRN

## 2017-06-10 MED ORDER — PIPERACILLIN-TAZOBACTAM 3.375 G IVPB 30 MIN
3.3750 g | Freq: Once | INTRAVENOUS | Status: AC
  Administered 2017-06-10: 3.375 g via INTRAVENOUS

## 2017-06-10 MED ORDER — GUAIFENESIN-DM 100-10 MG/5ML PO SYRP
5.0000 mL | ORAL_SOLUTION | ORAL | Status: DC | PRN
  Administered 2017-06-11: 5 mL via ORAL
  Filled 2017-06-10: qty 5

## 2017-06-10 MED ORDER — TAMSULOSIN HCL 0.4 MG PO CAPS
0.4000 mg | ORAL_CAPSULE | Freq: Every day | ORAL | Status: DC
  Administered 2017-06-10 – 2017-06-11 (×2): 0.4 mg via ORAL
  Filled 2017-06-10 (×2): qty 1

## 2017-06-10 MED ORDER — PIPERACILLIN-TAZOBACTAM 3.375 G IVPB 30 MIN: 3.3750 g | Freq: Once | INTRAVENOUS | Status: DC

## 2017-06-10 MED ORDER — PIPERACILLIN-TAZOBACTAM 3.375 G IVPB
3.3750 g | Freq: Three times a day (TID) | INTRAVENOUS | Status: DC
  Administered 2017-06-11 – 2017-06-12 (×5): 3.375 g via INTRAVENOUS
  Filled 2017-06-10 (×5): qty 50

## 2017-06-10 MED ORDER — DEXTROSE 5 % IV SOLN
2.0000 g | Freq: Once | INTRAVENOUS | Status: AC
  Administered 2017-06-10: 2 g via INTRAVENOUS
  Filled 2017-06-10: qty 2

## 2017-06-10 MED ORDER — INSULIN ASPART 100 UNIT/ML ~~LOC~~ SOLN: 0.0000 [IU] | Freq: Every day | SUBCUTANEOUS | Status: DC

## 2017-06-10 MED ORDER — HEPARIN SODIUM (PORCINE) 5000 UNIT/ML IJ SOLN
5000.0000 [IU] | Freq: Three times a day (TID) | INTRAMUSCULAR | Status: DC
  Administered 2017-06-11 – 2017-06-12 (×4): 5000 [IU] via SUBCUTANEOUS
  Filled 2017-06-10 (×4): qty 1

## 2017-06-10 MED ORDER — ACETAMINOPHEN 650 MG RE SUPP: 650.0000 mg | Freq: Four times a day (QID) | RECTAL | Status: DC | PRN

## 2017-06-10 MED ORDER — ONDANSETRON HCL 4 MG PO TABS: 4.0000 mg | ORAL_TABLET | Freq: Four times a day (QID) | ORAL | Status: DC | PRN

## 2017-06-10 MED ORDER — IPRATROPIUM-ALBUTEROL 0.5-2.5 (3) MG/3ML IN SOLN: 3.0000 mL | RESPIRATORY_TRACT | Status: DC | PRN

## 2017-06-10 NOTE — ED Provider Notes (Signed)
[facility] Emergency Department Provider Note   ____________________________________________   First MD Initiated Contact with Patient 06/10/17 1918     (approximate)  I have reviewed the triage vital signs and the nursing notes.   HISTORY  Chief Complaint Code Sepsis  Chief complaint is fever HPI Blake [name] is a 83 y.o. male patient was in the hospital earlier this month with a UTI. He has seen his urologist Dr. cope. Last urine culture was sensitive to Rocephin. He has bladder cancer. It is recurrent. He is now having fever or chills tachypnea and tachycardia and reports no pain or cough. Fevers low-grade.he denies any pain any headache any coughing any shortness of breath although he is breathing fast any chest pain in the belly pain and leg pain rash etc.   Past Medical History:  Diagnosis Date  . AAA (abdominal aortic aneurysm) ([place])   . Arrhythmia   . Diabetes mellitus without complication ([place])   . GI bleed   . Heart murmur   . Hyperlipidemia   . Hypertension   . Prostate cancer [facility])     Patient Active Problem List   Diagnosis Date Noted  . UTI (urinary tract infection) 05/17/2017  . Blood in stool   . Hemorrhage of rectum and anus   . Lower GI bleed 10/20/2016  . Near syncope   . Acute renal failure superimposed on chronic kidney disease ([place])   . Pneumonia 10/09/2016  . Aspiration pneumonia ([place]) 10/09/2016  . Acute respiratory failure ([place]) 10/09/2016  . AAA (abdominal aortic aneurysm) without rupture ([place]) 04/27/2016  . Arthritis, degenerative 03/31/2016  . A-fib ([place]) 03/31/2016  . HLD (hyperlipidemia) 03/31/2016  . CA of prostate ([place]) 03/31/2016  . SVT (supraventricular tachycardia) ([place]) 04/09/2015  . Diabetes mellitus, type 2 ([place]) 04/09/2015  . Benign hypertension 04/09/2015  . Acute myocardial infarction, initial episode of care [facility]) 04/09/2015    Past Surgical History:  Procedure Laterality Date  . FLEXIBLE  SIGMOIDOSCOPY N/A 10/21/2016   Procedure: FLEXIBLE SIGMOIDOSCOPY;  Surgeon: [name], MD;  Location: [facility] ENDOSCOPY;  Service: Endoscopy;  Laterality: N/A;  . JOINT REPLACEMENT    . PACEMAKER INSERTION Left 04/14/2015   Procedure: INSERTION PACEMAKER;  Surgeon: Isaias Cowman, MD;  Location: [facility] ORS;  Service: Cardiovascular;  Laterality: Left;  . PROSTATE SURGERY    . VISCERAL ARTERY INTERVENTION N/A 10/22/2016   Procedure: Visceral Artery Intervention;  Surgeon: Algernon Huxley, MD;  Location: [place] CV LAB;  Service: Cardiovascular;  Laterality: N/A;    Prior to Admission medications   Medication Sig Start Date End Date Taking? Authorizing Provider  amLODipine (NORVASC) 5 MG tablet Take 5 mg by mouth daily.    [provider]  azelastine (ASTELIN) 0.1 % nasal spray Place 1 spray into both nostrils 2 (two) times daily. Use in each nostril as directed    [provider]  finasteride (PROSCAR) 5 MG tablet Take 5 mg by mouth daily.    [provider]  glimepiride (AMARYL) 2 MG tablet 2 tablets in the morning and one tablet at night Patient taking differently: Take 4 mg by mouth 2 (two) times daily.  04/15/15   Adin Hector, MD  guaiFENesin-dextromethorphan (ROBITUSSIN DM) 100-10 MG/5ML syrup Take 5 mLs by mouth every 4 (four) hours as needed for cough. 04/03/17   Dustin Flock, MD  metoprolol tartrate (LOPRESSOR) 25 MG tablet Take 0.5 tablets (12.5 mg total) by mouth 2 (two) times daily. 10/13/16  Demetrios Loll, MD  Multiple Vitamins-Minerals (PRESERVISION/LUTEIN PO) Take 1 capsule by mouth 2 (two) times daily.    [provider]  ranitidine (ZANTAC) 150 MG tablet Take 150 mg by mouth 2 (two) times daily.    [provider]  rosuvastatin (CRESTOR) 5 MG tablet Take 5 mg by mouth at bedtime.    [provider]  sitaGLIPtin (JANUVIA) 50 MG tablet Take 50 mg by mouth daily.    [provider]  tamsulosin (FLOMAX) 0.4 MG CAPS  capsule Take 0.4 mg by mouth at bedtime.    [provider]  triamcinolone cream (KENALOG) 0.5 % Apply 1 application topically 2 (two) times daily.    [provider]    Allergies Diovan [valsartan]; Hydralazine; and Lisinopril  Family History  Problem Relation Age of Onset  . Hypertension Other   . Stroke Other   . Heart attack Other   . Stroke Sister   . Heart attack Brother     Social History Social History   Tobacco Use  . Smoking status: Former Research scientist (life sciences)  . Smokeless tobacco: Never Used  Substance Use Topics  . Alcohol use: No  . Drug use: No    Review of Systems Constitutional: fever Eyes: No visual changes. ENT: No sore throat. Cardiovascular: Denies chest pain. Respiratory: Denies shortness of breath. Gastrointestinal: No abdominal pain.  No nausea, no vomiting.  No diarrhea.  No constipation. Genitourinary: Negative for dysuria. Musculoskeletal: Negative for back pain. Skin: Negative for rash. Neurological: Negative for headaches, focal weakness   ____________________________________________   PHYSICAL EXAM:  VITAL SIGNS: ED Triage Vitals [06/10/17 1921]  Enc Vitals Group     BP (!) 166/125     Pulse Rate (!) 101     Resp (!) 33     Temp 100.2 F (37.9 C)     Temp Source Oral     SpO2 95 %     Weight      Height      Head Circumference      Peak Flow      Pain Score      Pain Loc      Pain Edu?      Excl. in [place]?     Constitutional: Alert tachypnea tachycardic Eyes: Conjunctivae are normal.  Head: Atraumatic. Nose: No congestion/rhinnorhea. Mouth/Throat: Mucous membranes are moist.  Oropharynx non-erythematous. Neck: No stridor.  Cardiovascular: rapid rate, regular rhythm. Grossly normal heart sounds.  Good peripheral circulation. Respiratory: Normal respiratory effort.  No retractions. Lungs CTAB. Gastrointestinal: Soft and nontender. No distention. No abdominal bruits. No CVA tenderness. Musculoskeletal: No lower  extremity tenderness nor edema.  No joint effusions. Neurologic:  Normal speech and language. No gross focal neurologic deficits are appreciated. . Skin:  Skin is warm, dry and intact. No rash noted. Psychiatric: Mood and affect are normal. Speech and behavior are normal.  ____________________________________________   LABS (all labs ordered are listed, but only abnormal results are displayed)  Labs Reviewed  COMPREHENSIVE METABOLIC PANEL - Abnormal; Notable for the following components:      Result Value   Sodium 134 (*)    Chloride 98 (*)    CO2 20 (*)    Glucose, Bld 193 (*)    BUN 41 (*)    Creatinine, Ser 2.85 (*)    Calcium 8.6 (*)    Total Protein 8.3 (*)    Albumin 3.2 (*)    GFR calc non Af Amer 18 (*)    GFR  calc Af Amer 21 (*)    Anion gap 16 (*)    All other components within normal limits  LACTIC ACID, PLASMA - Abnormal; Notable for the following components:   Lactic Acid, Venous 4.8 (*)    All other components within normal limits  CBC WITH DIFFERENTIAL/PLATELET - Abnormal; Notable for the following components:   RBC 3.89 (*)    Hemoglobin 10.8 (*)    HCT 33.0 (*)    RDW 17.1 (*)    Neutro Abs 8.1 (*)    All other components within normal limits  PROTIME-INR - Abnormal; Notable for the following components:   Prothrombin Time 15.7 (*)    All other components within normal limits  URINALYSIS, COMPLETE (UACMP) WITH MICROSCOPIC - Abnormal; Notable for the following components:   Color, Urine YELLOW (*)    APPearance CLOUDY (*)    Protein, ur 100 (*)    Leukocytes, UA MODERATE (*)    Bacteria, UA FEW (*)    Squamous Epithelial / LPF 0-5 (*)    All other components within normal limits  CULTURE, BLOOD (ROUTINE X 2)  CULTURE, BLOOD (ROUTINE X 2)  URINE CULTURE  LACTIC ACID, PLASMA    ____________________________________________  EKG   ____________________________________________  RADIOLOGY  ____________________________________________   PROCEDURES  Procedure(s) performed:   Procedures  Critical Care performedcritical care time 45 minutes. This involves speaking with the patient the patient's family and doing an extensive review of the patient's records in and attempt to find the current culture results ____________________________________________   INITIAL IMPRESSION / [place] / ED COURSE patient has sepsis appears to be from UTI. He's had this before. I do not find another source. We gave him 1 L fluid and reevaluated him since he has a congestive heart failure history. He appeared to be doing well also given second liter fluid. When this is finished we'll reevaluate him again and see if he can tolerate more fluid.      ____________________________________________   FINAL CLINICAL IMPRESSION(S) / ED DIAGNOSES  Final diagnoses:  Sepsis, due to unspecified organism [facility])  Urinary tract infection without hematuria, site unspecified     ED Discharge Orders    None       Note:  This document was prepared using Dragon voice recognition software and may include unintentional dictation errors.    Nena Polio, MD 06/10/17 2055

## 2017-06-10 NOTE — ED Notes (Signed)
Critical lactic acid of 4.8 called from lab. Dr. Cinda Quest notified.

## 2017-06-10 NOTE — ED Triage Notes (Signed)
Pt from home with known UTI per ems. Pt arrives shivering, hot to touch, hr of 120 per ems.

## 2017-06-10 NOTE — ED Notes (Signed)
Transport patient 151.AS

## 2017-06-10 NOTE — H&P (Signed)
[place] [facility] [place] NAME: Blake [name]    MR#:  [id number]  DATE OF BIRTH:  04-06-1923  DATE OF ADMISSION:  06/10/2017  PRIMARY CARE PHYSICIAN: Adin Hector, MD   REQUESTING/REFERRING PHYSICIAN: Cinda Quest, MD  CHIEF COMPLAINT:   Chief Complaint  Patient presents with  . Code Sepsis    HISTORY OF PRESENT ILLNESS:  Blake [name]  is a 83 y.o. male who presents with lethargy, malaise, chills.  Patient has been feeling bad for a couple of days but got acutely worse today.  He was brought to the ED by family for evaluation.  Here he is found to have UTI, as well as likely pneumonia.  Hospitalist were called for admission, patient met sepsis criteria  PAST MEDICAL HISTORY:   Past Medical History:  Diagnosis Date  . AAA (abdominal aortic aneurysm) ([place])   . Arrhythmia   . Diabetes mellitus without complication ([place])   . GI bleed   . Heart murmur   . Hyperlipidemia   . Hypertension   . Prostate cancer ([place])     PAST SURGICAL HISTORY:   Past Surgical History:  Procedure Laterality Date  . FLEXIBLE SIGMOIDOSCOPY N/A 10/21/2016   Procedure: FLEXIBLE SIGMOIDOSCOPY;  Surgeon: [name], MD;  Location: [facility] ENDOSCOPY;  Service: Endoscopy;  Laterality: N/A;  . JOINT REPLACEMENT    . PACEMAKER INSERTION Left 04/14/2015   Procedure: INSERTION PACEMAKER;  Surgeon: Isaias Cowman, MD;  Location: [facility] ORS;  Service: Cardiovascular;  Laterality: Left;  . PROSTATE SURGERY    . VISCERAL ARTERY INTERVENTION N/A 10/22/2016   Procedure: Visceral Artery Intervention;  Surgeon: Algernon Huxley, MD;  Location: [place] CV LAB;  Service: Cardiovascular;  Laterality: N/A;    SOCIAL HISTORY:   Social History   Tobacco Use  . Smoking status: Former Research scientist (life sciences)  . Smokeless tobacco: Never Used  Substance Use Topics  . Alcohol use: No    FAMILY HISTORY:   Family History  Problem Relation Age of Onset  . Hypertension Other   .  Stroke Other   . Heart attack Other   . Stroke Sister   . Heart attack Brother     DRUG ALLERGIES:   Allergies  Allergen Reactions  . Diovan [Valsartan] Cough  . Hydralazine Itching  . Lisinopril Cough    MEDICATIONS AT HOME:   Prior to Admission medications   Medication Sig Start Date End Date Taking? Authorizing Provider  amLODipine (NORVASC) 5 MG tablet Take 5 mg by mouth daily.   Yes [provider]  azelastine (ASTELIN) 0.1 % nasal spray Place 1 spray into both nostrils 2 (two) times daily. Use in each nostril as directed   Yes [provider]  finasteride (PROSCAR) 5 MG tablet Take 5 mg by mouth daily.   Yes [provider]  glimepiride (AMARYL) 2 MG tablet 2 tablets in the morning and one tablet at night Patient taking differently: Take 4 mg by mouth 2 (two) times daily.  04/15/15  Yes [name], MD  guaiFENesin-dextromethorphan (ROBITUSSIN DM) 100-10 MG/5ML syrup Take 5 mLs by mouth every 4 (four) hours as needed for cough. 04/03/17  Yes Dustin Flock, MD  hydrochlorothiazide (HYDRODIURIL) 25 MG tablet Take 25 mg by mouth daily.   Yes [provider]  metoprolol tartrate (LOPRESSOR) 25 MG tablet Take 0.5 tablets (12.5 mg total) by mouth 2 (two) times daily. 10/13/16  Yes Demetrios Loll, MD  Multiple Vitamins-Minerals (PRESERVISION/LUTEIN  PO) Take 1 capsule by mouth 2 (two) times daily.   Yes [provider]  pantoprazole (PROTONIX) 40 MG tablet Take 40 mg by mouth daily.   Yes [provider]  ranitidine (ZANTAC) 150 MG tablet Take 150 mg by mouth 2 (two) times daily.   Yes [provider]  rosuvastatin (CRESTOR) 5 MG tablet Take 5 mg by mouth at bedtime.   Yes [provider]  sitaGLIPtin (JANUVIA) 50 MG tablet Take 50 mg by mouth daily.   Yes [provider]  tamsulosin (FLOMAX) 0.4 MG CAPS capsule Take 0.4 mg by mouth at bedtime.   Yes [provider]  triamcinolone cream (KENALOG)  0.5 % Apply 1 application topically 2 (two) times daily.   Yes [provider]    REVIEW OF SYSTEMS:  Review of Systems  Constitutional: Positive for chills and malaise/fatigue. Negative for fever and weight loss.  HENT: Negative for ear pain, hearing loss and tinnitus.   Eyes: Negative for blurred vision, double vision, pain and redness.  Respiratory: Positive for cough. Negative for hemoptysis and shortness of breath.   Cardiovascular: Negative for chest pain, palpitations, orthopnea and leg swelling.  Gastrointestinal: Negative for abdominal pain, constipation, diarrhea, nausea and vomiting.  Genitourinary: Positive for dysuria and frequency. Negative for hematuria.  Musculoskeletal: Negative for back pain, joint pain and neck pain.  Skin:       No acne, rash, or lesions  Neurological: Positive for weakness. Negative for dizziness, tremors and focal weakness.  Endo/Heme/Allergies: Negative for polydipsia. Does not bruise/bleed easily.  Psychiatric/Behavioral: Negative for depression. The patient is not nervous/anxious and does not have insomnia.      VITAL SIGNS:   Vitals:   06/10/17 2045 06/10/17 2100 06/10/17 2115 06/10/17 2120  BP: 117/66 124/69 126/73   Pulse: 93 92 89   Resp: (!) 24 (!) 31 18   Temp:    99.4 F (37.4 C)  TempSrc:    Oral  SpO2: 96% 97% 97%   Weight:       Wt Readings from Last 3 Encounters:  06/10/17 99.5 kg (219 lb 7 oz)  05/31/17 104.3 kg (230 lb)  05/19/17 98 kg (216 lb)    PHYSICAL EXAMINATION:  Physical Exam  Vitals reviewed. Constitutional: He is oriented to person, place, and time. He appears well-developed and well-nourished. No distress.  HENT:  Head: Normocephalic and atraumatic.  Mouth/Throat: Oropharynx is clear and moist.  Eyes: Conjunctivae and EOM are normal. Pupils are equal, round, and reactive to light. No scleral icterus.  Neck: Normal range of motion. Neck supple. No JVD present. No thyromegaly present.   Cardiovascular: Normal rate, regular rhythm and intact distal pulses. Exam reveals no gallop and no friction rub.  No murmur heard. Respiratory: Effort normal. No respiratory distress. He has no wheezes. He has no rales.  Bibasilar rhonchi  GI: Soft. Bowel sounds are normal. He exhibits no distension. There is no tenderness.  Musculoskeletal: Normal range of motion. He exhibits no edema.  No arthritis, no gout  Lymphadenopathy:    He has no cervical adenopathy.  Neurological: He is alert and oriented to person, place, and time. No cranial nerve deficit.  No dysarthria, no aphasia  Skin: Skin is warm and dry. No rash noted. No erythema.  Psychiatric: He has a normal mood and affect. His behavior is normal. Judgment and thought content normal.    LABORATORY PANEL:   CBC Recent Labs  Lab 06/10/17 1936  WBC 10.3  HGB 10.8*  HCT 33.0*  PLT 170   ------------------------------------------------------------------------------------------------------------------  Chemistries  Recent Labs  Lab 06/10/17 1936  NA 134*  K 4.5  CL 98*  CO2 20*  GLUCOSE 193*  BUN 41*  CREATININE 2.85*  CALCIUM 8.6*  AST 24  ALT 17  ALKPHOS 98  BILITOT 0.7   ------------------------------------------------------------------------------------------------------------------  Cardiac Enzymes No results for input(s): TROPONINI in the last 168 hours. ------------------------------------------------------------------------------------------------------------------  RADIOLOGY:  Dg Chest Portable 1 View  Result Date: 06/10/2017 CLINICAL DATA:  Fever EXAM: PORTABLE CHEST 1 VIEW COMPARISON:  05/17/2017 FINDINGS: Left-sided pacing device as before. Streaky atelectasis or scarring at the bases. No consolidation or effusion. Borderline cardiomegaly with aortic atherosclerosis. No pneumothorax. IMPRESSION: Streaky bibasilar atelectasis or infiltrates. Electronically Signed   By: Donavan Foil M.D.   On:  06/10/2017 20:02    EKG:   Orders placed or performed during the hospital encounter of 06/10/17  . EKG 12-Lead  . EKG 12-Lead  . EKG 12-Lead  . EKG 12-Lead  . ED EKG 12-Lead  . ED EKG 12-Lead    IMPRESSION AND PLAN:  Principal Problem:   Sepsis ([place]) -lactic acid was initially elevated, corrected to within normal limits after initial fluid resuscitation.  Blood pressure stable, IV antibiotics ordered and given, cultures sent Active Problems:   UTI (urinary tract infection) -IV antibiotics as above, culture sent   CAP (community acquired pneumonia) -IV antibiotics and supportive treatment   Diabetes mellitus, type 2 (HCC) -sliding scale insulin with corresponding glucose checks   Benign hypertension -continue home meds   HLD (hyperlipidemia) -continue home meds  All the records are reviewed and case discussed with ED provider. Management plans discussed with the patient and/or family.  DVT PROPHYLAXIS: SubQ heparin  GI PROPHYLAXIS: None  ADMISSION STATUS: Inpatient  CODE STATUS: Full Code Status History    Date Active Date Inactive Code Status Order ID Comments User Context   05/17/2017 08:29 05/19/2017 15:25 Full Code [id number]  Hillary Bow, MD ED   03/31/2017 13:34 04/03/2017 16:11 Full Code [id number]  Nicholes Mango, MD Inpatient   10/21/2016 01:55 10/25/2016 21:45 Full Code [id number]  Harvie Bridge, DO Inpatient   10/09/2016 23:42 10/13/2016 20:09 Full Code [id number]  Vaughan Basta, MD Inpatient   04/14/2015 14:32 04/15/2015 17:39 Full Code [id number]  Isaias Cowman, MD Inpatient   04/09/2015 00:22 04/14/2015 14:32 Full Code [id number]  Hower, Aaron Mose, MD ED      TOTAL TIME TAKING CARE OF THIS PATIENT: 45 minutes.   Jannifer Franklin, Ryeleigh Santore Weir 06/10/2017, 9:35 PM  [facility]  Office  [phone]  CC: Primary care physician; Adin Hector, MD  Note:  This document was prepared using Dragon voice recognition software and may include  unintentional dictation errors.

## 2017-06-10 NOTE — ED Notes (Signed)
Pt and family updated on progression of care. Family verbalizes understanding. Pt denies needs. Skin is cooler.

## 2017-06-10 NOTE — Progress Notes (Addendum)
Pharmacy Antibiotic Note  Blake [name] is a 83 y.o. male admitted on 06/10/2017 with sepsis.  Pharmacy has been consulted for Zosyn dosing.  Plan: Zosyn 3.375g IV q8h (4 hour infusion).  Height: 6\' 2"  (188 cm) Weight: 220 lb 4.8 oz (99.9 kg) IBW/kg (Calculated) : 82.2  Temp (24hrs), Avg:100.1 F (37.8 C), Min:98.8 F (37.1 C), Max:102.1 F (38.9 C)  Recent Labs  Lab 06/10/17 1936 06/10/17 2115  WBC 10.3  --   CREATININE 2.85*  --   LATICACIDVEN 4.8* 1.5    Estimated Creatinine Clearance: 20.9 mL/min (A) (by C-G formula based on SCr of 2.85 mg/dL (H)).    Allergies  Allergen Reactions  . Diovan [Valsartan] Cough  . Hydralazine Itching  . Lisinopril Cough    Antimicrobials this admission: Ceftriaxone x1, Zosyn 11/30  >>    >>   Dose adjustments this admission:   Microbiology results: 11/30 BCx: pending 11/30 UCx: pending       11/30 UA: LE(+) NO2(-)  WBC TNTC 11/30 CXR: bibasilar atelectasis vs. infiltrate  Thank you for allowing pharmacy to be a part of this patient's care.  Blake [name] S 06/10/2017 11:21 PM

## 2017-06-11 LAB — BASIC METABOLIC PANEL WITH GFR
Anion gap: 6 (ref 5–15)
BUN: 39 mg/dL — ABNORMAL HIGH (ref 6–20)
CO2: 23 mmol/L (ref 22–32)
Calcium: 7.7 mg/dL — ABNORMAL LOW (ref 8.9–10.3)
Chloride: 104 mmol/L (ref 101–111)
Creatinine, Ser: 2.63 mg/dL — ABNORMAL HIGH (ref 0.61–1.24)
GFR calc Af Amer: 23 mL/min — ABNORMAL LOW
GFR calc non Af Amer: 20 mL/min — ABNORMAL LOW
Glucose, Bld: 208 mg/dL — ABNORMAL HIGH (ref 65–99)
Potassium: 3.8 mmol/L (ref 3.5–5.1)
Sodium: 133 mmol/L — ABNORMAL LOW (ref 135–145)

## 2017-06-11 LAB — CBC
HEMATOCRIT: 25.8 % — AB (ref 40.0–52.0)
HEMOGLOBIN: 8.4 g/dL — AB (ref 13.0–18.0)
MCH: 27.4 pg (ref 26.0–34.0)
MCHC: 32.7 g/dL (ref 32.0–36.0)
MCV: 83.8 fL (ref 80.0–100.0)
Platelets: 129 10*3/uL — ABNORMAL LOW (ref 150–440)
RBC: 3.09 MIL/uL — ABNORMAL LOW (ref 4.40–5.90)
RDW: 16.2 % — AB (ref 11.5–14.5)
WBC: 8.6 10*3/uL (ref 3.8–10.6)

## 2017-06-11 LAB — GLUCOSE, CAPILLARY
Glucose-Capillary: 143 mg/dL — ABNORMAL HIGH (ref 65–99)
Glucose-Capillary: 151 mg/dL — ABNORMAL HIGH (ref 65–99)
Glucose-Capillary: 115 mg/dL — ABNORMAL HIGH (ref 65–99)
Glucose-Capillary: 185 mg/dL — ABNORMAL HIGH (ref 65–99)

## 2017-06-11 MED ORDER — ORAL CARE MOUTH RINSE
15.0000 mL | Freq: Two times a day (BID) | OROMUCOSAL | Status: DC
  Administered 2017-06-11 – 2017-06-12 (×3): 15 mL via OROMUCOSAL

## 2017-06-11 NOTE — Progress Notes (Deleted)
Pt doing well up in chair with physical therapy , pain managed with pain medication. Sitting up in chair and eating. No complaints of pain.

## 2017-06-11 NOTE — Progress Notes (Signed)
Pt admitted from ED @ 2245 in stable condition. A+Ox4. Oriented to room and unit. Admission assessment completed with patient and pts son. Son reported pts PCP discontinued pts amlodipine last week to see if BLE swelling would decrease. Denies pain. Fall and safety precautions implemented.

## 2017-06-11 NOTE — Progress Notes (Signed)
Pt doing well, continues on abt no sign or symptoms of any adverse reaction. Son at bedside.

## 2017-06-11 NOTE — Progress Notes (Signed)
[place] at [place] NAME: Blake [name]    MR#:  [id number]  DATE OF BIRTH:  [birth year]  SUBJECTIVE:   Patient here due to lethargy weakness malaise and noted to have a urinary tract infection also suspected pneumonia. Patient's family is at bedside. Patient feels a bit better since yesterday. Denies any dysuria, hematuria. Denies any cough, congestion or shortness of breath presently.  REVIEW OF SYSTEMS:    Review of Systems  Constitutional: Negative for chills and fever.  HENT: Negative for congestion and tinnitus.   Eyes: Negative for blurred vision and double vision.  Respiratory: Negative for cough, shortness of breath and wheezing.   Cardiovascular: Negative for chest pain, orthopnea and PND.  Gastrointestinal: Negative for abdominal pain, diarrhea, nausea and vomiting.  Genitourinary: Negative for dysuria and hematuria.  Neurological: Positive for weakness. Negative for dizziness, sensory change and focal weakness.  All other systems reviewed and are negative.   Nutrition: Heart healthy/Carb control Tolerating Diet: Yes Tolerating PT: Await Eval   DRUG ALLERGIES:   Allergies  Allergen Reactions  . Diovan [Valsartan] Cough  . Hydralazine Itching  . Lisinopril Cough    VITALS:  Blood pressure 133/64, pulse 68, temperature 98.5 F (36.9 C), temperature source Oral, resp. rate 18, height '6\' 2"'$  (1.88 m), weight 99.9 kg (220 lb 4.8 oz), SpO2 99 %.  PHYSICAL EXAMINATION:   Physical Exam  GENERAL:  83 y.o.-year-old patient lying in bed in no acute distress.  EYES: Pupils equal, round, reactive to light and accommodation. No scleral icterus. Extraocular muscles intact.  HEENT: Head atraumatic, normocephalic. Oropharynx and nasopharynx clear.  NECK:  Supple, no jugular venous distention. No thyroid enlargement, no tenderness.  LUNGS: Normal breath sounds bilaterally, no wheezing, rales, rhonchi. No use of accessory muscles of  respiration.  CARDIOVASCULAR: S1, S2 normal. No murmurs, rubs, or gallops.  ABDOMEN: Soft, nontender, nondistended. Bowel sounds present. No organomegaly or mass.  EXTREMITIES: No cyanosis, clubbing or edema b/l.    NEUROLOGIC: Cranial nerves II through XII are intact. No focal Motor or sensory deficits b/l. Globally weak.    PSYCHIATRIC: The patient is alert and oriented x 3.  SKIN: No obvious rash, lesion, or ulcer.    LABORATORY PANEL:   CBC Recent Labs  Lab 06/11/17 0425  WBC 8.6  HGB 8.4*  HCT 25.8*  PLT 129*   ------------------------------------------------------------------------------------------------------------------  Chemistries  Recent Labs  Lab 06/10/17 1936 06/11/17 0425  NA 134* 133*  K 4.5 3.8  CL 98* 104  CO2 20* 23  GLUCOSE 193* 208*  BUN 41* 39*  CREATININE 2.85* 2.63*  CALCIUM 8.6* 7.7*  AST 24  --   ALT 17  --   ALKPHOS 98  --   BILITOT 0.7  --    ------------------------------------------------------------------------------------------------------------------  Cardiac Enzymes No results for input(s): TROPONINI in the last 168 hours. ------------------------------------------------------------------------------------------------------------------  RADIOLOGY:  Dg Chest Portable 1 View  Result Date: 06/10/2017 CLINICAL DATA:  Fever EXAM: PORTABLE CHEST 1 VIEW COMPARISON:  05/17/2017 FINDINGS: Left-sided pacing device as before. Streaky atelectasis or scarring at the bases. No consolidation or effusion. Borderline cardiomegaly with aortic atherosclerosis. No pneumothorax. IMPRESSION: Streaky bibasilar atelectasis or infiltrates. Electronically Signed   By: Donavan Foil M.D.   On: 06/10/2017 20:02     ASSESSMENT AND PLAN:   83 year old male with past medical history of bladder cancer currently undergoing treatment, diabetes, chronic kidney disease stage III, hypertension, hyperlipidemia, who presented to the hospital due to  lethargy and  weakness and malaise and noted to have a urinary tract infection.  1. Sepsis-patient met criteria of admission given fever, tachycardia and urinalysis positive for UTI. -Continue IV Zosyn, follow cultures which are negative so far.  2. Urinary tract infection-patient had recent urine cultures with the primary care physician's office 3 days ago which are nonconclusive. Continue IV Zosyn, follow urine cultures. Currently afebrile and hemodynamically stable.  3. BPH-continue Proscar, Flomax.  4. History of bladder cancer-currently undergoing treatment. Patient follows up with Dr. Jacqlyn Larsen  5. Diabetes type 2 without complication-blood sugar stable. Continue sliding scale insulin for now.  6. Hyperlipidemia-continue Crestor.  7. GERD-continue Protonix.  All the records are reviewed and case discussed with Care Management/Social Worker. Management plans discussed with the patient, family and they are in agreement.  CODE STATUS: Full code  DVT Prophylaxis: Hep. SQ  TOTAL TIME TAKING CARE OF THIS PATIENT: 30 minutes.   POSSIBLE D/C IN 1-2 DAYS, DEPENDING ON CLINICAL CONDITION.   Henreitta Leber M.D on 06/11/2017 at 12:57 PM  Between 7am to 6pm - Pager - [phone]  After 6pm go to [email or web address] - Proofreader  [facility]  Office  [phone]  CC: Primary care physician; Adin Hector, MD

## 2017-06-12 LAB — GLUCOSE, CAPILLARY
GLUCOSE-CAPILLARY: 119 mg/dL — AB (ref 65–99)
GLUCOSE-CAPILLARY: 148 mg/dL — AB (ref 65–99)

## 2017-06-12 LAB — URINE CULTURE

## 2017-06-12 LAB — BASIC METABOLIC PANEL
ANION GAP: 8 (ref 5–15)
BUN: 32 mg/dL — AB (ref 6–20)
CO2: 24 mmol/L (ref 22–32)
CREATININE: 2.55 mg/dL — AB (ref 0.61–1.24)
Calcium: 8.3 mg/dL — ABNORMAL LOW (ref 8.9–10.3)
Chloride: 104 mmol/L (ref 101–111)
GFR, EST AFRICAN AMERICAN: 24 mL/min — AB (ref >60)
GFR, EST NON AFRICAN AMERICAN: 20 mL/min — AB (ref >60)
Glucose, Bld: 133 mg/dL — ABNORMAL HIGH (ref 65–99)
Potassium: 4 mmol/L (ref 3.5–5.1)
Sodium: 136 mmol/L (ref 135–145)

## 2017-06-12 LAB — CBC
HEMATOCRIT: 26.7 % — AB (ref 40.0–52.0)
Hemoglobin: 8.8 g/dL — ABNORMAL LOW (ref 13.0–18.0)
MCH: 27.5 pg (ref 26.0–34.0)
MCHC: 32.8 g/dL (ref 32.0–36.0)
MCV: 83.8 fL (ref 80.0–100.0)
Platelets: 127 10*3/uL — ABNORMAL LOW (ref 150–440)
RBC: 3.19 MIL/uL — ABNORMAL LOW (ref 4.40–5.90)
RDW: 16.1 % — AB (ref 11.5–14.5)
WBC: 6.9 10*3/uL (ref 3.8–10.6)

## 2017-06-12 MED ORDER — AMOXICILLIN-POT CLAVULANATE 875-125 MG PO TABS: 1.0000 | ORAL_TABLET | Freq: Two times a day (BID) | ORAL | 0 refills | Status: AC

## 2017-06-12 NOTE — Discharge Summary (Signed)
[place] at [place] NAME: Blake [name]    MR#:  [id number]  DATE OF BIRTH:  11/02/1922  DATE OF ADMISSION:  06/10/2017 ADMITTING PHYSICIAN: Lance Coon, MD  DATE OF DISCHARGE: 06/12/2017  PRIMARY CARE PHYSICIAN: Adin Hector, MD    ADMISSION DIAGNOSIS:  Sepsis, due to unspecified organism ([place]) [A41.9] Urinary tract infection without hematuria, site unspecified [N39.0]  DISCHARGE DIAGNOSIS:  Principal Problem:   Sepsis ([place]) Active Problems:   Diabetes mellitus, type 2 ([place])   Benign hypertension   A-fib ([place])   HLD (hyperlipidemia)   UTI (urinary tract infection)   CAP (community acquired pneumonia)   SECONDARY DIAGNOSIS:   Past Medical History:  Diagnosis Date  . AAA (abdominal aortic aneurysm) ([place])   . Arrhythmia   . Diabetes mellitus without complication ([place])   . GI bleed   . Heart murmur   . Hyperlipidemia   . Hypertension   . Prostate cancer [facility])     HOSPITAL COURSE:   83 year old male with past medical history of bladder cancer currently undergoing treatment, diabetes, chronic kidney disease stage III, hypertension, hyperlipidemia, who presented to the hospital due to lethargy and weakness and malaise and noted to have a urinary tract infection.  1. Sepsis-patient met criteria of admission given fever, tachycardia and urinalysis positive for UTI. -While in the hospital patient was treated with IV Zosyn and not being discharged on oral Augmentin. Patient's urine cultures remained negative and his blood cultures also negative. He has been afebrile and hemodynamically stable over the past 24-48 hours.  2. Urinary tract infection- cause of pt's Sepsis - patient had recent urine cultures with the primary care physician's office 3 days ago which are nonconclusive. -Patient's cultures while in the hospital also remained negative. Empirically patient was treated with IV Zosyn, but now being discharged on oral  Augmentin.   3. BPH- pt. Will continue Proscar, Flomax.  4. History of bladder cancer-currently undergoing treatment. Cont. Follow up with Dr. Jacqlyn Larsen.   5. Diabetes type 2 without complication- while in the hospital pt. Was on SSI but now being discharged on his Januvia.   6. Hyperlipidemia- pt. Will continue Crestor.  7. GERD- pt. Will continue Protonix.   DISCHARGE CONDITIONS:   Stable  CONSULTS OBTAINED:    DRUG ALLERGIES:   Allergies  Allergen Reactions  . Diovan [Valsartan] Cough  . Hydralazine Itching  . Lisinopril Cough    DISCHARGE MEDICATIONS:   Allergies as of 06/12/2017      Reactions   Diovan [valsartan] Cough   Hydralazine Itching   Lisinopril Cough      Medication List    TAKE these medications   amLODipine 5 MG tablet Commonly known as:  NORVASC Take 5 mg by mouth daily. Notes to patient:  Not taken during this admission   amoxicillin-clavulanate 875-125 MG tablet Commonly known as:  AUGMENTIN Take 1 tablet by mouth 2 (two) times daily for 5 days.   azelastine 0.1 % nasal spray Commonly known as:  ASTELIN Place 1 spray into both nostrils 2 (two) times daily. Use in each nostril as directed   finasteride 5 MG tablet Commonly known as:  PROSCAR Take 5 mg by mouth daily.   glimepiride 2 MG tablet Commonly known as:  AMARYL 2 tablets in the morning and one tablet at night What changed:    how much to take  how to take this  when to take this  additional instructions Notes  to patient:  Not taken during this admission   guaiFENesin-dextromethorphan 100-10 MG/5ML syrup Commonly known as:  ROBITUSSIN DM Take 5 mLs by mouth every 4 (four) hours as needed for cough.   hydrochlorothiazide 25 MG tablet Commonly known as:  HYDRODIURIL Take 25 mg by mouth daily. Notes to patient:  No taken during this admission   metoprolol tartrate 25 MG tablet Commonly known as:  LOPRESSOR Take 0.5 tablets (12.5 mg total) by mouth 2 (two) times  daily. Notes to patient:  Not taken during this admission   pantoprazole 40 MG tablet Commonly known as:  PROTONIX Take 40 mg by mouth daily.   PRESERVISION/LUTEIN PO Take 1 capsule by mouth 2 (two) times daily. Notes to patient:  Not taken during this admission   ranitidine 150 MG tablet Commonly known as:  ZANTAC Take 150 mg by mouth 2 (two) times daily. Notes to patient:  Not taken during this admission   rosuvastatin 5 MG tablet Commonly known as:  CRESTOR Take 5 mg by mouth at bedtime.   sitaGLIPtin 50 MG tablet Commonly known as:  JANUVIA Take 50 mg by mouth daily. Notes to patient:  Not taken during this admission   tamsulosin 0.4 MG Caps capsule Commonly known as:  FLOMAX Take 0.4 mg by mouth at bedtime.   triamcinolone cream 0.5 % Commonly known as:  KENALOG Apply 1 application topically 2 (two) times daily. Notes to patient:  Not taken during this admission         DISCHARGE INSTRUCTIONS:   DIET:  Cardiac diet and Diabetic diet  DISCHARGE CONDITION:  Stable  ACTIVITY:  Activity as tolerated  OXYGEN:  Home Oxygen: No.   Oxygen Delivery: room air  DISCHARGE LOCATION:  home   If you experience worsening of your admission symptoms, develop shortness of breath, life threatening emergency, suicidal or homicidal thoughts you must seek medical attention immediately by calling 911 or calling your MD immediately  if symptoms less severe.  You Must read complete instructions/literature along with all the possible adverse reactions/side effects for all the Medicines you take and that have been prescribed to you. Take any new Medicines after you have completely understood and accpet all the possible adverse reactions/side effects.   Please note  You were cared for by a hospitalist during your hospital stay. If you have any questions about your discharge medications or the care you received while you were in the hospital after you are discharged, you can  call the unit and asked to speak with the hospitalist on call if the hospitalist that took care of you is not available. Once you are discharged, your primary care physician will handle any further medical issues. Please note that NO REFILLS for any discharge medications will be authorized once you are discharged, as it is imperative that you return to your primary care physician (or establish a relationship with a primary care physician if you do not have one) for your aftercare needs so that they can reassess your need for medications and monitor your lab values.     Today   No acute events overnight.  Family at bedside.  No complaints.  Feels better. Afebrile overnight. Cultures have been (-).   VITAL SIGNS:  Blood pressure (!) 156/71, pulse 73, temperature 98.9 F (37.2 C), temperature source Oral, resp. rate 16, height _0  (1.88 m), weight 99.9 kg (220 lb 4.8 oz), SpO2 99 %.  I/O:    Intake/Output Summary (Last 24 hours) at 06/12/2017  [place] filed at 06/12/2017 1000 Gross per 24 hour  Intake 200 ml  Output 1100 ml  Net -900 ml    PHYSICAL EXAMINATION:   GENERAL:  83 y.o.-year-old patient lying in bed in no acute distress.  EYES: Pupils equal, round, reactive to light and accommodation. No scleral icterus. Extraocular muscles intact.  HEENT: Head atraumatic, normocephalic. Oropharynx and nasopharynx clear.  NECK:  Supple, no jugular venous distention. No thyroid enlargement, no tenderness.  LUNGS: Normal breath sounds bilaterally, no wheezing, rales, rhonchi. No use of accessory muscles of respiration.  CARDIOVASCULAR: S1, S2 normal. No murmurs, rubs, or gallops.  ABDOMEN: Soft, nontender, nondistended. Bowel sounds present. No organomegaly or mass.  EXTREMITIES: No cyanosis, clubbing or edema b/l.    NEUROLOGIC: Cranial nerves II through XII are intact. No focal Motor or sensory deficits b/l. Globally weak.    PSYCHIATRIC: The patient is alert and oriented x 3.  SKIN:  No obvious rash, lesion, or ulcer.     DATA REVIEW:   CBC Recent Labs  Lab 06/12/17 0440  WBC 6.9  HGB 8.8*  HCT 26.7*  PLT 127*    Chemistries  Recent Labs  Lab 06/10/17 1936  06/12/17 0440  NA 134*   < > 136  K 4.5   < > 4.0  CL 98*   < > 104  CO2 20*   < > 24  GLUCOSE 193*   < > 133*  BUN 41*   < > 32*  CREATININE 2.85*   < > 2.55*  CALCIUM 8.6*   < > 8.3*  AST 24  --   --   ALT 17  --   --   ALKPHOS 98  --   --   BILITOT 0.7  --   --    < > = values in this interval not displayed.    Cardiac Enzymes No results for input(s): TROPONINI in the last 168 hours.  Microbiology Results  Results for orders placed or performed during the hospital encounter of 06/10/17  Blood Culture (routine x 2)     Status: None (Preliminary result)   Collection Time: 06/10/17  7:36 PM  Result Value Ref Range Status   Specimen Description BLOOD BLOOD LEFT WRIST  Final   Special Requests   Final    BOTTLES DRAWN AEROBIC AND ANAEROBIC Blood Culture adequate volume   Culture NO GROWTH 2 DAYS  Final   Report Status PENDING  Incomplete  Culture, blood (Routine x 2)     Status: None (Preliminary result)   Collection Time: 06/10/17  7:36 PM  Result Value Ref Range Status   Specimen Description BLOOD BLOOD RIGHT WRIST  Final   Special Requests   Final    BOTTLES DRAWN AEROBIC AND ANAEROBIC Blood Culture adequate volume   Culture NO GROWTH 2 DAYS  Final   Report Status PENDING  Incomplete  Urine culture     Status: Abnormal   Collection Time: 06/10/17  7:36 PM  Result Value Ref Range Status   Specimen Description URINE, CATHETERIZED  Final   Special Requests cipro  Final   Culture (A)  Final    <10,000 COLONIES/mL INSIGNIFICANT GROWTH Performed at [facility], [address], [place], [place] [address]    Report Status 06/12/2017 FINAL  Final    RADIOLOGY:  Dg Chest Portable 1 View  Result Date: 06/10/2017 CLINICAL DATA:  Fever EXAM: PORTABLE CHEST 1 VIEW COMPARISON:   05/17/2017 FINDINGS: Left-sided pacing  device as before. Streaky atelectasis or scarring at the bases. No consolidation or effusion. Borderline cardiomegaly with aortic atherosclerosis. No pneumothorax. IMPRESSION: Streaky bibasilar atelectasis or infiltrates. Electronically Signed   By: Donavan Foil M.D.   On: 06/10/2017 20:02      Management plans discussed with the patient, family and they are in agreement.  CODE STATUS:     Code Status Orders  (From admission, onward)        Start     Ordered   06/10/17 2305  Full code  Continuous     06/10/17 2304      Advance Directive Documentation     Most Recent Value  Type of Advance Directive  Healthcare Power of Attorney  Pre-existing out of facility DNR order (yellow form or pink MOST form)  No data  "MOST" Form in Place?  No data      TOTAL TIME TAKING CARE OF THIS PATIENT: 40 minutes.    Henreitta Leber M.D on 06/12/2017 at 2:29 PM  Between 7am to 6pm - Pager - [phone]  After 6pm go to [email or web address] - Proofreader  [facility]  Office  [phone]  CC: Primary care physician; Adin Hector, MD

## 2017-06-12 NOTE — Progress Notes (Signed)
Blake [name] to be D/C'd Home per MD order.  Discussed with the patient and all questions fully answered.  VSS, Skin clean, dry and intact without evidence of skin break down, no evidence of skin tears noted. IV catheter discontinued intact. Site without signs and symptoms of complications. Dressing and pressure applied.  An After Visit Summary was printed and given to the patient. Patient received prescription.  D/c education completed with patient/family including follow up instructions, medication list, d/c activities limitations if indicated, with other d/c instructions as indicated by MD - patient able to verbalize understanding, all questions fully answered.   Patient instructed to return to ED, call 911, or call MD for any changes in condition.   Patient escorted via [place], and D/C home via private auto.  Deri Fuelling 06/12/2017 3:44 PM

## 2017-06-12 NOTE — Progress Notes (Signed)
Patient alert and oriented. Denies pain other than a small headache. Denies pain or discomfort while urinating. Vitals stable.   Deri Fuelling, RN

## 2017-06-12 NOTE — Evaluation (Signed)
Physical Therapy Evaluation Patient Details Name: Blake [name] MRN: [id number] DOB: [birth year] Today's Date: 06/12/2017   History of Present Illness  Blake [name] is a 83yo black male who comes to [facility] after several days of not feeling well at home, malaise, chilles, adn fever, found to have UTI and PNA c sepsis. Pt was evaluated by PT on 05/17/17 at prior admission for UTI. PMH: macular degeneration, AAA, DM, GIB, HTN, HLD, PrCA, PPM. At baseline pt AMB household distances only, [facility] between daughters and caregivers.   Clinical Impression  Patient is at baseline for all functional mobility with noted deficits below, all recommended equipment and supervision already in place. Patient and daughter (caregiver) attest to confidence in safety for DC to home at this time. All education completed, and time is given to address all questions/concerns. No additional skilled PT services needed at this time, PT signing off. PT recommends daily ambulation ad lib or with nursing staff as needed to prevent deconditioning.      Follow Up Recommendations Home health PT;Supervision for mobility/OOB    Equipment Recommendations  None recommended by PT    Recommendations for Other Services       Precautions / Restrictions Precautions Precautions: Fall Restrictions Weight Bearing Restrictions: No      Mobility  Bed Mobility Overal bed mobility: Needs Assistance Bed Mobility: Supine to Sit;Sit to Supine     Supine to sit: Mod assist Sit to supine: Supervision   General bed mobility comments: trunk weakness  Transfers Overall transfer level: Needs assistance Equipment used: Rolling walker (2 wheeled) Transfers: Sit to/from Stand Sit to Stand: From elevated surface;Min assist         General transfer comment: minA for lift assist and push forward once in standing d/t retropulsion; daughter reports this as their technique at home.   Ambulation/Gait Ambulation/Gait  assistance: Min guard Ambulation Distance (Feet): 180 Feet Assistive device: Rolling walker (2 wheeled) Gait Pattern/deviations: Step-to pattern Gait velocity: 0.75ms  Gait velocity interpretation: <1.8 ft/sec, indicative of risk for recurrent falls General Gait Details: flexed posture, slow gait, chronic bent knee gait  Stairs            Wheelchair Mobility    Modified Rankin (Stroke Patients Only)       Balance                                             Pertinent Vitals/Pain Pain Assessment: No/denies pain    Home Living Family/patient expects to be discharged to:: Private residence Living Arrangements: Spouse/significant other Available Help at Discharge: Family;Personal care attendant;Available 24 hours/day Type of Home: House Home Access: Ramped entrance     Home Layout: Multi-level;Able to live on main level with bedroom/bathroom Home Equipment: Walker - 4 wheels;Cane - single point;Bedside commode;Shower seat Additional Comments: Pt has a caregiver while daughter away at work.      Prior Function Level of Independence: Needs assistance   Gait / Transfers Assistance Needed: Pt ambualtes household distances with rollator.  Pt denies any falls in the past 6 months.    ADL's / Homemaking Assistance Needed: Pt has assist from caregivers for bathing, dressing.  Daughter does the cooking, cleaning.    Comments: Pt reports that he is only really out of the house for doctor's appointments,     Hand Dominance   Dominant Hand:  Right    Extremity/Trunk Assessment                Communication   Communication: No difficulties  Cognition Arousal/Alertness: Awake/alert Behavior During Therapy: WFL for tasks assessed/performed Overall Cognitive Status: Within Functional Limits for tasks assessed                                        General Comments      Exercises     Assessment/Plan    PT Assessment All  further PT needs can be met in the next venue of care  PT Problem List Decreased strength;Decreased activity tolerance;Decreased balance;Decreased mobility       PT Treatment Interventions      PT Goals (Current goals can be found in the Care Plan section)  Acute Rehab PT Goals PT Goal Formulation: All assessment and education complete, DC therapy    Frequency     Barriers to discharge        Co-evaluation               AM-PAC PT "6 Clicks" Daily Activity  Outcome Measure Difficulty turning over in bed (including adjusting bedclothes, sheets and blankets)?: Unable Difficulty moving from lying on back to sitting on the side of the bed? : Unable Difficulty sitting down on and standing up from a chair with arms (e.g., wheelchair, bedside commode, etc,.)?: Unable Help needed moving to and from a bed to chair (including a wheelchair)?: A Lot Help needed walking in hospital room?: A Little Help needed climbing 3-5 steps with a railing? : A Lot 6 Click Score: 10    End of Session Equipment Utilized During Treatment: Gait belt Activity Tolerance: Patient tolerated treatment well;No increased pain;Patient limited by fatigue Patient left: in bed;with call bell/phone within reach;with family/visitor present;with bed alarm set(chaired-out) Nurse Communication: Mobility status PT Visit Diagnosis: Unsteadiness on feet (R26.81);Other abnormalities of gait and mobility (R26.89)    Time: 9150-5697 PT Time Calculation (min) (ACUTE ONLY): 25 min   Charges:   PT Evaluation $PT Eval Moderate Complexity: 1 Mod PT Treatments $Therapeutic Activity: 8-22 mins   PT G Codes:   PT G-Codes **NOT FOR INPATIENT CLASS** Functional Assessment Tool Used: AM-PAC 6 Clicks Basic Mobility;Clinical judgement Functional Limitation: Mobility: Walking and moving around Mobility: Walking and Moving Around Current Status (X4801): At least 60 percent but less than 80 percent impaired, limited or  restricted Mobility: Walking and Moving Around Goal Status [phone]): At least 60 percent but less than 80 percent impaired, limited or restricted Mobility: Walking and Moving Around Discharge Status [phone]): At least 60 percent but less than 80 percent impaired, limited or restricted    1:40 PM, 06/12/17 Etta Grandchild, PT, DPT Physical Therapist - [facility])  [phone] (mobile)   Buccola,Allan C 06/12/2017, 1:38 PM

## 2017-06-12 NOTE — Care Management Note (Signed)
Case Management Note  Patient Details  Name: Blake [name] MRN: [id number] Date of Birth: Aug 22, 1922  Subjective/Objective:   Referral for HH=PT called to Melene Muller at [facility].                  Action/Plan:   Expected Discharge Date:  06/12/17               Expected Discharge Plan:  [place]  In-House Referral:     Discharge planning Services  CM Consult  Post Acute Care Choice:  Home Health Choice offered to:  Patient  DME Arranged:    DME Agency:     HH Arranged:  PT [place]:  [place]  Status of Service:  Completed, signed off  If discussed at [place] of Stay Meetings, dates discussed:    Additional Comments:  Sharyah Bostwick A, RN 06/12/2017, 4:13 PM

## 2017-06-15 LAB — CULTURE, BLOOD (ROUTINE X 2)
CULTURE: NO GROWTH
Culture: NO GROWTH
SPECIAL REQUESTS: ADEQUATE
Special Requests: ADEQUATE

## 2017-08-25 DIAGNOSIS — N3020 Other chronic cystitis without hematuria: Secondary | ICD-10-CM | POA: Insufficient documentation

## 2017-09-10 ENCOUNTER — Inpatient Hospital Stay
Admission: EM | Admit: 2017-09-10 | Discharge: 2017-09-13 | DRG: 872 | Disposition: A | Discharge status: Home Health | Payer: Medicare Other | Attending: Internal Medicine | Admitting: Internal Medicine

## 2017-09-10 ENCOUNTER — Encounter: Payer: Self-pay | Admitting: Emergency Medicine

## 2017-09-10 ENCOUNTER — Emergency Department: Payer: Medicare Other

## 2017-09-10 ENCOUNTER — Other Ambulatory Visit: Payer: Self-pay

## 2017-09-10 DIAGNOSIS — L8990 Pressure ulcer of unspecified site, unspecified stage: Secondary | ICD-10-CM

## 2017-09-10 DIAGNOSIS — Z950 Presence of cardiac pacemaker: Secondary | ICD-10-CM | POA: Diagnosis not present

## 2017-09-10 DIAGNOSIS — E1165 Type 2 diabetes mellitus with hyperglycemia: Secondary | ICD-10-CM | POA: Diagnosis present

## 2017-09-10 DIAGNOSIS — Z7984 Long term (current) use of oral hypoglycemic drugs: Secondary | ICD-10-CM | POA: Diagnosis not present

## 2017-09-10 DIAGNOSIS — N390 Urinary tract infection, site not specified: Secondary | ICD-10-CM | POA: Diagnosis present

## 2017-09-10 DIAGNOSIS — I4891 Unspecified atrial fibrillation: Secondary | ICD-10-CM | POA: Diagnosis present

## 2017-09-10 DIAGNOSIS — E119 Type 2 diabetes mellitus without complications: Secondary | ICD-10-CM

## 2017-09-10 DIAGNOSIS — R739 Hyperglycemia, unspecified: Secondary | ICD-10-CM

## 2017-09-10 DIAGNOSIS — B961 Klebsiella pneumoniae [K. pneumoniae] as the cause of diseases classified elsewhere: Secondary | ICD-10-CM | POA: Diagnosis present

## 2017-09-10 DIAGNOSIS — I714 Abdominal aortic aneurysm, without rupture: Secondary | ICD-10-CM | POA: Diagnosis present

## 2017-09-10 DIAGNOSIS — Z87891 Personal history of nicotine dependence: Secondary | ICD-10-CM

## 2017-09-10 DIAGNOSIS — N184 Chronic kidney disease, stage 4 (severe): Secondary | ICD-10-CM | POA: Diagnosis present

## 2017-09-10 DIAGNOSIS — L8993 Pressure ulcer of unspecified site, stage 3: Secondary | ICD-10-CM

## 2017-09-10 DIAGNOSIS — E860 Dehydration: Secondary | ICD-10-CM | POA: Diagnosis present

## 2017-09-10 DIAGNOSIS — A419 Sepsis, unspecified organism: Secondary | ICD-10-CM

## 2017-09-10 DIAGNOSIS — Z8546 Personal history of malignant neoplasm of prostate: Secondary | ICD-10-CM | POA: Diagnosis not present

## 2017-09-10 DIAGNOSIS — E785 Hyperlipidemia, unspecified: Secondary | ICD-10-CM | POA: Diagnosis present

## 2017-09-10 DIAGNOSIS — Z8249 Family history of ischemic heart disease and other diseases of the circulatory system: Secondary | ICD-10-CM | POA: Diagnosis not present

## 2017-09-10 DIAGNOSIS — Z8551 Personal history of malignant neoplasm of bladder: Secondary | ICD-10-CM

## 2017-09-10 DIAGNOSIS — E1122 Type 2 diabetes mellitus with diabetic chronic kidney disease: Secondary | ICD-10-CM | POA: Diagnosis present

## 2017-09-10 DIAGNOSIS — N3000 Acute cystitis without hematuria: Secondary | ICD-10-CM | POA: Diagnosis present

## 2017-09-10 DIAGNOSIS — I129 Hypertensive chronic kidney disease with stage 1 through stage 4 chronic kidney disease, or unspecified chronic kidney disease: Secondary | ICD-10-CM | POA: Diagnosis present

## 2017-09-10 DIAGNOSIS — N179 Acute kidney failure, unspecified: Secondary | ICD-10-CM | POA: Diagnosis present

## 2017-09-10 DIAGNOSIS — Z888 Allergy status to other drugs, medicaments and biological substances status: Secondary | ICD-10-CM | POA: Diagnosis not present

## 2017-09-10 DIAGNOSIS — A4159 Other Gram-negative sepsis: Secondary | ICD-10-CM | POA: Diagnosis not present

## 2017-09-10 DIAGNOSIS — I493 Ventricular premature depolarization: Secondary | ICD-10-CM | POA: Diagnosis not present

## 2017-09-10 DIAGNOSIS — Z87440 Personal history of urinary (tract) infections: Secondary | ICD-10-CM | POA: Diagnosis not present

## 2017-09-10 DIAGNOSIS — N189 Chronic kidney disease, unspecified: Secondary | ICD-10-CM

## 2017-09-10 DIAGNOSIS — R6510 Systemic inflammatory response syndrome (SIRS) of non-infectious origin without acute organ dysfunction: Secondary | ICD-10-CM

## 2017-09-10 LAB — LACTIC ACID, PLASMA
LACTIC ACID, VENOUS: 2.9 mmol/L — AB (ref 0.5–1.9)
Lactic Acid, Venous: 2.7 mmol/L (ref 0.5–1.9)

## 2017-09-10 LAB — BASIC METABOLIC PANEL
ANION GAP: 15 (ref 5–15)
BUN: 48 mg/dL — ABNORMAL HIGH (ref 6–20)
CO2: 17 mmol/L — ABNORMAL LOW (ref 22–32)
Calcium: 8.6 mg/dL — ABNORMAL LOW (ref 8.9–10.3)
Chloride: 99 mmol/L — ABNORMAL LOW (ref 101–111)
Creatinine, Ser: 2.66 mg/dL — ABNORMAL HIGH (ref 0.61–1.24)
GFR calc Af Amer: 22 mL/min — ABNORMAL LOW (ref >60)
GFR calc non Af Amer: 19 mL/min — ABNORMAL LOW (ref >60)
GLUCOSE: 350 mg/dL — AB (ref 65–99)
POTASSIUM: 4.4 mmol/L (ref 3.5–5.1)
Sodium: 131 mmol/L — ABNORMAL LOW (ref 135–145)

## 2017-09-10 LAB — URINALYSIS, COMPLETE (UACMP) WITH MICROSCOPIC
BILIRUBIN URINE: NEGATIVE
Glucose, UA: 50 mg/dL — AB
KETONES UR: NEGATIVE mg/dL
Nitrite: NEGATIVE
PROTEIN: 100 mg/dL — AB
Specific Gravity, Urine: 1.014 (ref 1.005–1.030)
Squamous Epithelial / LPF: NONE SEEN
pH: 5 (ref 5.0–8.0)

## 2017-09-10 LAB — GLUCOSE, CAPILLARY
GLUCOSE-CAPILLARY: 267 mg/dL — AB (ref 65–99)
Glucose-Capillary: 278 mg/dL — ABNORMAL HIGH (ref 65–99)

## 2017-09-10 LAB — CBC
HEMATOCRIT: 35.8 % — AB (ref 40.0–52.0)
HEMOGLOBIN: 11.5 g/dL — AB (ref 13.0–18.0)
MCH: 27.2 pg (ref 26.0–34.0)
MCHC: 32.2 g/dL (ref 32.0–36.0)
MCV: 84.4 fL (ref 80.0–100.0)
Platelets: 126 10*3/uL — ABNORMAL LOW (ref 150–440)
RBC: 4.24 MIL/uL — ABNORMAL LOW (ref 4.40–5.90)
RDW: 16.7 % — ABNORMAL HIGH (ref 11.5–14.5)
WBC: 14.4 10*3/uL — ABNORMAL HIGH (ref 3.8–10.6)

## 2017-09-10 MED ORDER — METOPROLOL TARTRATE 25 MG PO TABS: 12.5000 mg | ORAL_TABLET | Freq: Two times a day (BID) | ORAL | Status: DC

## 2017-09-10 MED ORDER — PANTOPRAZOLE SODIUM 40 MG IV SOLR
40.0000 mg | Freq: Two times a day (BID) | INTRAVENOUS | Status: DC
  Administered 2017-09-10 – 2017-09-12 (×4): 40 mg via INTRAVENOUS
  Filled 2017-09-10 (×4): qty 40

## 2017-09-10 MED ORDER — ACETAMINOPHEN 325 MG PO TABS
650.0000 mg | ORAL_TABLET | Freq: Four times a day (QID) | ORAL | Status: DC | PRN
  Administered 2017-09-10: 650 mg via ORAL
  Filled 2017-09-10: qty 2

## 2017-09-10 MED ORDER — AZELASTINE HCL 0.1 % NA SOLN: 1.0000 | Freq: Two times a day (BID) | NASAL | Status: DC

## 2017-09-10 MED ORDER — GLIMEPIRIDE 2 MG PO TABS
4.0000 mg | ORAL_TABLET | Freq: Every day | ORAL | Status: DC
  Administered 2017-09-11 – 2017-09-13 (×3): 4 mg via ORAL
  Filled 2017-09-10 (×3): qty 2

## 2017-09-10 MED ORDER — INSULIN ASPART 100 UNIT/ML ~~LOC~~ SOLN
0.0000 [IU] | Freq: Three times a day (TID) | SUBCUTANEOUS | Status: DC
  Administered 2017-09-10: 5 [IU] via SUBCUTANEOUS
  Administered 2017-09-11: 2 [IU] via SUBCUTANEOUS
  Administered 2017-09-11 (×2): 5 [IU] via SUBCUTANEOUS
  Administered 2017-09-12 – 2017-09-13 (×4): 2 [IU] via SUBCUTANEOUS
  Filled 2017-09-10 (×9): qty 1

## 2017-09-10 MED ORDER — HEPARIN SODIUM (PORCINE) 5000 UNIT/ML IJ SOLN
5000.0000 [IU] | Freq: Three times a day (TID) | INTRAMUSCULAR | Status: DC
  Administered 2017-09-10 – 2017-09-13 (×9): 5000 [IU] via SUBCUTANEOUS
  Filled 2017-09-10 (×9): qty 1

## 2017-09-10 MED ORDER — SODIUM CHLORIDE 0.9 % IV BOLUS (SEPSIS)
1000.0000 mL | Freq: Once | INTRAVENOUS | Status: AC
  Administered 2017-09-10: 1000 mL via INTRAVENOUS

## 2017-09-10 MED ORDER — OCUVITE-LUTEIN PO CAPS
1.0000 | ORAL_CAPSULE | Freq: Two times a day (BID) | ORAL | Status: DC
  Administered 2017-09-10 – 2017-09-13 (×6): 1 via ORAL
  Filled 2017-09-10 (×7): qty 1

## 2017-09-10 MED ORDER — IPRATROPIUM-ALBUTEROL 0.5-2.5 (3) MG/3ML IN SOLN
3.0000 mL | Freq: Four times a day (QID) | RESPIRATORY_TRACT | Status: DC
  Administered 2017-09-10 – 2017-09-11 (×3): 3 mL via RESPIRATORY_TRACT
  Filled 2017-09-10 (×3): qty 3

## 2017-09-10 MED ORDER — ACETAMINOPHEN 650 MG RE SUPP: 650.0000 mg | Freq: Four times a day (QID) | RECTAL | Status: DC | PRN

## 2017-09-10 MED ORDER — TAMSULOSIN HCL 0.4 MG PO CAPS
0.4000 mg | ORAL_CAPSULE | Freq: Every day | ORAL | Status: DC
  Administered 2017-09-11 – 2017-09-12 (×2): 0.4 mg via ORAL
  Filled 2017-09-10 (×2): qty 1

## 2017-09-10 MED ORDER — FINASTERIDE 5 MG PO TABS
5.0000 mg | ORAL_TABLET | Freq: Every day | ORAL | Status: DC
  Administered 2017-09-11 – 2017-09-13 (×3): 5 mg via ORAL
  Filled 2017-09-10 (×3): qty 1

## 2017-09-10 MED ORDER — SODIUM CHLORIDE 0.9 % IV SOLN
1.0000 g | INTRAVENOUS | Status: DC
  Administered 2017-09-11: 1 g via INTRAVENOUS
  Filled 2017-09-10 (×2): qty 10

## 2017-09-10 MED ORDER — LINAGLIPTIN 5 MG PO TABS
5.0000 mg | ORAL_TABLET | Freq: Every day | ORAL | Status: DC
  Administered 2017-09-11 – 2017-09-13 (×3): 5 mg via ORAL
  Filled 2017-09-10 (×3): qty 1

## 2017-09-10 MED ORDER — AMLODIPINE BESYLATE 5 MG PO TABS
5.0000 mg | ORAL_TABLET | Freq: Every day | ORAL | Status: DC
  Administered 2017-09-10 – 2017-09-11 (×2): 5 mg via ORAL
  Filled 2017-09-10 (×4): qty 1

## 2017-09-10 MED ORDER — SODIUM CHLORIDE 0.9 % IV SOLN
1.0000 g | Freq: Once | INTRAVENOUS | Status: AC
  Administered 2017-09-10: 1 g via INTRAVENOUS
  Filled 2017-09-10: qty 10

## 2017-09-10 MED ORDER — ONDANSETRON HCL 4 MG PO TABS: 4.0000 mg | ORAL_TABLET | Freq: Four times a day (QID) | ORAL | Status: DC | PRN

## 2017-09-10 MED ORDER — DOCUSATE SODIUM 100 MG PO CAPS
100.0000 mg | ORAL_CAPSULE | Freq: Two times a day (BID) | ORAL | Status: DC
  Administered 2017-09-10 – 2017-09-13 (×6): 100 mg via ORAL
  Filled 2017-09-10 (×6): qty 1

## 2017-09-10 MED ORDER — SODIUM CHLORIDE 0.9 % IV SOLN
INTRAVENOUS | Status: DC
  Administered 2017-09-10 – 2017-09-13 (×5): via INTRAVENOUS

## 2017-09-10 MED ORDER — BISACODYL 10 MG RE SUPP: 10.0000 mg | Freq: Every day | RECTAL | Status: DC | PRN

## 2017-09-10 MED ORDER — GLIMEPIRIDE 2 MG PO TABS
2.0000 mg | ORAL_TABLET | Freq: Every day | ORAL | Status: DC
  Administered 2017-09-10 – 2017-09-12 (×3): 2 mg via ORAL
  Filled 2017-09-10 (×4): qty 1

## 2017-09-10 MED ORDER — ONDANSETRON HCL 4 MG/2ML IJ SOLN: 4.0000 mg | Freq: Four times a day (QID) | INTRAMUSCULAR | Status: DC | PRN

## 2017-09-10 MED ORDER — ROSUVASTATIN CALCIUM 10 MG PO TABS
5.0000 mg | ORAL_TABLET | Freq: Every day | ORAL | Status: DC
  Administered 2017-09-10 – 2017-09-12 (×3): 5 mg via ORAL
  Filled 2017-09-10 (×3): qty 1

## 2017-09-10 NOTE — ED Notes (Signed)
Attempted urine sample.  Pt to call out when he is ready to give urine sample.

## 2017-09-10 NOTE — Progress Notes (Signed)
Pharmacy Antibiotic Note  Blake [name] is a 84 y.o. male admitted on 09/10/2017 with UTI.  Pharmacy has been consulted for ceftriaxone dosing.  Plan: ceftriaxone 1gm iv q24h   Height: 6\' 2"  (188 cm) Weight: 220 lb (99.8 kg) IBW/kg (Calculated) : 82.2  Temp (24hrs), Avg:97.9 F (36.6 C), Min:97.9 F (36.6 C), Max:97.9 F (36.6 C)  Recent Labs  Lab 09/10/17 1051  WBC 14.4*  CREATININE 2.66*    Estimated Creatinine Clearance: 22.4 mL/min (A) (by C-G formula based on SCr of 2.66 mg/dL (H)).    Allergies  Allergen Reactions  . Diovan [Valsartan] Cough  . Hydralazine Itching  . Lisinopril Cough    Antimicrobials this admission: Anti-infectives (From admission, onward)   Start     Dose/Rate Route Frequency Ordered Stop   09/11/17 1300  cefTRIAXone (ROCEPHIN) 1 g in sodium chloride 0.9 % 100 mL IVPB     1 g 200 mL/hr over 30 Minutes Intravenous Every 24 hours 09/10/17 1303     09/10/17 1230  cefTRIAXone (ROCEPHIN) 1 g in sodium chloride 0.9 % 100 mL IVPB     1 g 200 mL/hr over 30 Minutes Intravenous  Once 09/10/17 1223         Microbiology results: No results found for this or any previous visit (from the past 240 hour(s)).   Thank you for allowing pharmacy to be a part of this patient's care.  Blake [name] 09/10/2017 1:04 PM

## 2017-09-10 NOTE — Progress Notes (Signed)
LCSW arriving to complete patient assessment.  [employer] LCSW [phone]

## 2017-09-10 NOTE — Progress Notes (Signed)
CODE SEPSIS - PHARMACY COMMUNICATION  **Broad Spectrum Antibiotics should be administered within 1 hour of Sepsis diagnosis**  Time Code Sepsis Called/Page Received: 1236  Antibiotics Ordered: ctx   Time of 1st antibiotic administration: 3888  Additional action taken by pharmacy: none  If necessary, Name of Provider/Nurse Contacted: none    Thomasenia Sales ,PharmD Clinical Pharmacist  09/10/2017  2:16 PM

## 2017-09-10 NOTE — ED Provider Notes (Signed)
[facility] Emergency Department Provider Note ____________________________________________   I have reviewed the triage vital signs and the triage nursing note.  HISTORY  Chief Complaint Weakness   Historian Level 5 Caveat History Limited by patient poor historian History by daughter and grandson  HPI Blake [name] is a 84 y.o. male presents with daughter with whom he lives, she states that he was weak for the past 24 hours and has decreased level of alertness.  This has happened previously with urinary tract infection.  They also noticed a intermittent cough and some rhonchi or crackles that they noticed on their own lung exam in the right posterior lung fields.  No vomiting or diarrhea.  He has been complaining of some pain at his right heel which they are concerned could be a pressure wound.  No specific complaints of abdominal pain.  He did have a urinary tract infection and was on a possible questionable cephalosporin about several weeks ago, and has been on a preventative or prophylactic dose of Macrobid since then.   Past Medical History:  Diagnosis Date  . AAA (abdominal aortic aneurysm) ([place])   . Arrhythmia   . Diabetes mellitus without complication ([place])   . GI bleed   . Heart murmur   . Hyperlipidemia   . Hypertension   . Prostate cancer [facility])     Patient Active Problem List   Diagnosis Date Noted  . Sepsis ([place]) 06/10/2017  . CAP (community acquired pneumonia) 06/10/2017  . UTI (urinary tract infection) 05/17/2017  . Blood in stool   . Hemorrhage of rectum and anus   . Lower GI bleed 10/20/2016  . Near syncope   . Acute renal failure superimposed on chronic kidney disease ([place])   . Pneumonia 10/09/2016  . Aspiration pneumonia ([place]) 10/09/2016  . Acute respiratory failure ([place]) 10/09/2016  . AAA (abdominal aortic aneurysm) without rupture ([place]) 04/27/2016  . Arthritis, degenerative 03/31/2016  . A-fib ([place]) 03/31/2016  .  HLD (hyperlipidemia) 03/31/2016  . CA of prostate ([place]) 03/31/2016  . SVT (supraventricular tachycardia) ([place]) 04/09/2015  . Diabetes mellitus, type 2 ([place]) 04/09/2015  . Benign hypertension 04/09/2015  . Acute myocardial infarction, initial episode of care [facility]) 04/09/2015    Past Surgical History:  Procedure Laterality Date  . FLEXIBLE SIGMOIDOSCOPY N/A 10/21/2016   Procedure: FLEXIBLE SIGMOIDOSCOPY;  Surgeon: [name], MD;  Location: [facility] ENDOSCOPY;  Service: Endoscopy;  Laterality: N/A;  . JOINT REPLACEMENT    . PACEMAKER INSERTION Left 04/14/2015   Procedure: INSERTION PACEMAKER;  Surgeon: Isaias Cowman, MD;  Location: [facility] ORS;  Service: Cardiovascular;  Laterality: Left;  . PROSTATE SURGERY    . VISCERAL ARTERY INTERVENTION N/A 10/22/2016   Procedure: Visceral Artery Intervention;  Surgeon: Algernon Huxley, MD;  Location: [place] CV LAB;  Service: Cardiovascular;  Laterality: N/A;    Prior to Admission medications   Medication Sig Start Date End Date Taking? Authorizing Provider  amLODipine (NORVASC) 5 MG tablet Take 5 mg by mouth daily.    [provider]  azelastine (ASTELIN) 0.1 % nasal spray Place 1 spray into both nostrils 2 (two) times daily. Use in each nostril as directed    [provider]  finasteride (PROSCAR) 5 MG tablet Take 5 mg by mouth daily.    [provider]  glimepiride (AMARYL) 2 MG tablet 2 tablets in the morning and one tablet at night Patient taking differently: Take 4 mg by mouth 2 (two) times daily.  04/15/15  Adin Hector, MD  guaiFENesin-dextromethorphan [facility] DM) 100-10 MG/5ML syrup Take 5 mLs by mouth every 4 (four) hours as needed for cough. 04/03/17   Dustin Flock, MD  hydrochlorothiazide (HYDRODIURIL) 25 MG tablet Take 25 mg by mouth daily.    [provider]  metoprolol tartrate (LOPRESSOR) 25 MG tablet Take 0.5 tablets (12.5 mg total) by mouth 2 (two) times daily. 10/13/16   Demetrios Loll, MD   Multiple Vitamins-Minerals (PRESERVISION/LUTEIN PO) Take 1 capsule by mouth 2 (two) times daily.    [provider]  pantoprazole (PROTONIX) 40 MG tablet Take 40 mg by mouth daily.    [provider]  ranitidine (ZANTAC) 150 MG tablet Take 150 mg by mouth 2 (two) times daily.    [provider]  rosuvastatin (CRESTOR) 5 MG tablet Take 5 mg by mouth at bedtime.    [provider]  sitaGLIPtin (JANUVIA) 50 MG tablet Take 50 mg by mouth daily.    [provider]  tamsulosin (FLOMAX) 0.4 MG CAPS capsule Take 0.4 mg by mouth at bedtime.    [provider]  triamcinolone cream (KENALOG) 0.5 % Apply 1 application topically 2 (two) times daily.    [provider]    Allergies  Allergen Reactions  . Diovan [Valsartan] Cough  . Hydralazine Itching  . Lisinopril Cough    Family History  Problem Relation Age of Onset  . Hypertension Other   . Stroke Other   . Heart attack Other   . Stroke Sister   . Heart attack Brother     Social History Social History   Tobacco Use  . Smoking status: Former Research scientist (life sciences)  . Smokeless tobacco: Never Used  Substance Use Topics  . Alcohol use: No  . Drug use: No    Review of Systems  Constitutional: Negative for fever. Eyes: Negative for visual changes. ENT: Negative for sore throat. Cardiovascular: Negative for chest pain. Respiratory: Negative for shortness of breath. Gastrointestinal: Negative for abdominal pain, vomiting and diarrhea. Genitourinary: Positive for strong smelling urine. Musculoskeletal: Negative for back pain. Skin: Negative for rash. Neurological: Negative for headache.  For decreased level consciousness, fatigue and interaction. ____________________________________________   PHYSICAL EXAM:  VITAL SIGNS: ED Triage Vitals  Enc Vitals Group     BP 09/10/17 1042 115/65     Pulse Rate 09/10/17 1042 (!) 110     Resp 09/10/17 1042 18     Temp 09/10/17 1042 97.9 F  (36.6 C)     Temp Source 09/10/17 1042 Oral     SpO2 09/10/17 1042 98 %     Weight 09/10/17 1044 220 lb (99.8 kg)     Height 09/10/17 1044 6\' 2"  (1.88 m)     Head Circumference --      Peak Flow --      Pain Score --      Pain Loc --      Pain Edu? --      Excl. in [place]? --      Constitutional: Alert and operative, poor historian.  He is in no acute distress. HEENT   Head: Normocephalic and atraumatic.      Eyes: Conjunctivae are normal. Pupils equal and round.       Ears:         Nose: No congestion/rhinnorhea.   Mouth/Throat: Mucous membranes are dry.   Neck: No stridor. Cardiovascular/Chest: Tachycardic rate, regular rhythm.  No murmurs, rubs, or gallops. Respiratory: Normal respiratory effort without tachypnea nor  retractions. Breath sounds are clear and equal bilaterally.  Mild rhonchi posterior right side.  No wheezing. Gastrointestinal: Soft. No distention, no guarding, no rebound. Nontender.    Genitourinary/rectal: Deferred Musculoskeletal: Nontender with normal range of motion in all extremities.  Questionable early pressure wound to the right heel where it slightly tender.  There is no skin breakdown.  No lower extremity edema.  No calf tenderness to palpation.  No leg swelling. Neurologic: No facial droop.  Slow to interact.  Poor historian.  No apparent focal neurologic deficit on exam.   Skin:  Skin is warm, dry and intact. No rash noted. Psychiatric: No agitation.   ____________________________________________  LABS (pertinent positives/negatives) I, Lisa Roca, MD the attending physician have reviewed the labs noted below.  Labs Reviewed  BASIC METABOLIC PANEL - Abnormal; Notable for the following components:      Result Value   Sodium 131 (*)    Chloride 99 (*)    CO2 17 (*)    Glucose, Bld 350 (*)    BUN 48 (*)    Creatinine, Ser 2.66 (*)    Calcium 8.6 (*)    GFR calc non Af Amer 19 (*)    GFR calc Af Amer 22 (*)    All other components  within normal limits  CBC - Abnormal; Notable for the following components:   WBC 14.4 (*)    RBC 4.24 (*)    Hemoglobin 11.5 (*)    HCT 35.8 (*)    RDW 16.7 (*)    Platelets 126 (*)    All other components within normal limits  URINALYSIS, COMPLETE (UACMP) WITH MICROSCOPIC - Abnormal; Notable for the following components:   Color, Urine YELLOW (*)    APPearance TURBID (*)    Glucose, UA 50 (*)    Hgb urine dipstick MODERATE (*)    Protein, ur 100 (*)    Leukocytes, UA LARGE (*)    Bacteria, UA MANY (*)    All other components within normal limits  URINE CULTURE  CULTURE, BLOOD (ROUTINE X 2)  CULTURE, BLOOD (ROUTINE X 2)  LACTIC ACID, PLASMA  LACTIC ACID, PLASMA  CBG MONITORING, ED    ____________________________________________    EKG I, Lisa Roca, MD, the attending physician have personally viewed and interpreted all ECGs.  110 bpm.  Sinus tachycardia.  Right bundle branch block.  We be underlying baseline.  No clear ST segment elevation. ____________________________________________  RADIOLOGY   Chest x-ray portable viewed by me no focal infiltrate __________________________________________  PROCEDURES  Procedure(s) performed: None  Critical Care performed: None   ____________________________________________  ED COURSE / ASSESSMENT AND PLAN  Pertinent labs & imaging results that were available during my care of the patient were reviewed by me and considered in my medical decision making (see chart for details).    Patient with tachycardia, altered mental status which is a decreased level of consciousness without a focal neurologic deficit, has a urinary tract infection with an elevated white blood cell count in the setting of tachycardia, concerning for sepsis.  Blood cultures were also drawn.  Lactate pending at time of hospitalist consultation.  Patient started on antibiotic Rocephin.  Lives with family, they noted possible mild cough, chest x-ray  looks clear.  He does have known diabetes and a blood sugar is elevated in 300s.  He is not DKA.  He has chronic renal failure which is no change from underlying baseline.  He does have decreased mental status but without a  focal neurologic deficit, I am less suspicious for intracranial cause such as stroke we discussed holding off on CT right now.  They did state the patient was complaining of right heel pain which it looks like there probably is a very early pressure wound.  Initially family was a little concerned about the possibility of DVT and/or PE.  The patient is not hypoxic.  Is not complaining of chest discomfort, and his legs are without any focal one-sided swelling or calf tenderness or lower extremity edema, and I did discuss with the family that right now I feel like this would be pretty low concern, and it let us treat the very clearly evident sepsis from urinary tract infection here in the hospital and then proceed if things are not improving as would be expected.  DIFFERENTIAL DIAGNOSIS: Including but not limited to urinary tract infection, sepsis, pneumonia, intracranial cause such as stroke, acute renal failure, electrolyte disturbance, etc.  CONSULTATIONS:   Hospitalist for admission.   Patient / Family / Caregiver informed of clinical course, medical decision-making process, and agree with plan.    ___________________________________________   FINAL CLINICAL IMPRESSION(S) / ED DIAGNOSES   Final diagnoses:  Urinary tract infection without hematuria, site unspecified  Sepsis, due to unspecified organism ([place])  Chronic renal failure, unspecified CKD stage  Hyperglycemia      ___________________________________________        Note: This dictation was prepared with Dragon dictation. Any transcriptional errors that result from this process are unintentional    Lisa Roca, MD 09/10/17 1237

## 2017-09-10 NOTE — H&P (Signed)
History and Physical    Blake [name] DDU:[id number] DOB: 23-Dec-1922 DOA: 09/10/2017  Referring physician: Dr. Reita Cliche PCP: Adin Hector, MD  Specialists: none  Chief Complaint: weakness  HPI: Blake [name] is a 84 y.o. male has a past medical history significant for DM, HTN, AAA, and prostate cancer now with 1-2 week hx of progressive weakness with anorexia, fever, and cough. In ER, pt tachycardic with UTI and elevated WBC. Also with hyperglycemia and acute on chronic renal failure with dehydration. He is now admitted. Some cough and SOB. No CP. Denies vomiting or diarrhea. Lives at home with daughter. Has not been eating or drinking well. Some fever per daughter  Review of Systems: The patient denies weight loss,, vision loss, decreased hearing, hoarseness, chest pain, syncope,  peripheral edema, balance deficits, hemoptysis, abdominal pain, melena, hematochezia, severe indigestion/heartburn, hematuria, incontinence, genital sores, muscle weakness, suspicious skin lesions, transient blindness, difficulty walking, depression, unusual weight change, abnormal bleeding, enlarged lymph nodes, angioedema, and breast masses.   Past Medical History:  Diagnosis Date  . AAA (abdominal aortic aneurysm) ([place])   . Arrhythmia   . Diabetes mellitus without complication ([place])   . GI bleed   . Heart murmur   . Hyperlipidemia   . Hypertension   . Prostate cancer [facility])    Past Surgical History:  Procedure Laterality Date  . FLEXIBLE SIGMOIDOSCOPY N/A 10/21/2016   Procedure: FLEXIBLE SIGMOIDOSCOPY;  Surgeon: [name], MD;  Location: [facility] ENDOSCOPY;  Service: Endoscopy;  Laterality: N/A;  . JOINT REPLACEMENT    . PACEMAKER INSERTION Left 04/14/2015   Procedure: INSERTION PACEMAKER;  Surgeon: Isaias Cowman, MD;  Location: [facility] ORS;  Service: Cardiovascular;  Laterality: Left;  . PROSTATE SURGERY    . VISCERAL ARTERY INTERVENTION N/A 10/22/2016   Procedure: Visceral Artery Intervention;   Surgeon: Algernon Huxley, MD;  Location: [place] CV LAB;  Service: Cardiovascular;  Laterality: N/A;   Social History:  reports that he has quit smoking. he has never used smokeless tobacco. He reports that he does not drink alcohol or use drugs.  Allergies  Allergen Reactions  . Diovan [Valsartan] Cough  . Hydralazine Itching  . Lisinopril Cough    Family History  Problem Relation Age of Onset  . Hypertension Other   . Stroke Other   . Heart attack Other   . Stroke Sister   . Heart attack Brother     Prior to Admission medications   Medication Sig Start Date End Date Taking? Authorizing Provider  amLODipine (NORVASC) 5 MG tablet Take 5 mg by mouth daily.    [provider]  azelastine (ASTELIN) 0.1 % nasal spray Place 1 spray into both nostrils 2 (two) times daily. Use in each nostril as directed    [provider]  finasteride (PROSCAR) 5 MG tablet Take 5 mg by mouth daily.    [provider]  glimepiride (AMARYL) 2 MG tablet 2 tablets in the morning and one tablet at night Patient taking differently: Take 4 mg by mouth 2 (two) times daily.  04/15/15   Adin Hector, MD  guaiFENesin-dextromethorphan (ROBITUSSIN DM) 100-10 MG/5ML syrup Take 5 mLs by mouth every 4 (four) hours as needed for cough. 04/03/17   Dustin Flock, MD  hydrochlorothiazide (HYDRODIURIL) 25 MG tablet Take 25 mg by mouth daily.    [provider]  metoprolol tartrate (LOPRESSOR) 25 MG tablet Take 0.5 tablets (12.5 mg total) by mouth 2 (two) times daily. 10/13/16  Demetrios Loll, MD  Multiple Vitamins-Minerals (PRESERVISION/LUTEIN PO) Take 1 capsule by mouth 2 (two) times daily.    [provider]  pantoprazole (PROTONIX) 40 MG tablet Take 40 mg by mouth daily.    [provider]  ranitidine (ZANTAC) 150 MG tablet Take 150 mg by mouth 2 (two) times daily.    [provider]  rosuvastatin (CRESTOR) 5 MG tablet Take 5 mg by mouth at bedtime.     [provider]  sitaGLIPtin (JANUVIA) 50 MG tablet Take 50 mg by mouth daily.    [provider]  tamsulosin (FLOMAX) 0.4 MG CAPS capsule Take 0.4 mg by mouth at bedtime.    [provider]  triamcinolone cream (KENALOG) 0.5 % Apply 1 application topically 2 (two) times daily.    [provider]   Physical Exam: Vitals:   09/10/17 1042 09/10/17 1044  BP: 115/65   Pulse: (!) 110   Resp: 18   Temp: 97.9 F (36.6 C)   TempSrc: Oral   SpO2: 98%   Weight:  99.8 kg (220 lb)  Height:  6\' 2"  (1.88 m)     General:  WDWN, [place]/AT, in milddistress  Eyes: PERRL, EOMI, no scleral icterus, conjunctiva clear  ENT: moist oropharynx without exudate, TM's benign, dentition fair  Neck: supple, no lymphadenopathy. No bruits or thyromegaly  Cardiovascular: regular rate without MRG; 2+ peripheral pulses, no JVD, 1+ peripheral edema  Respiratory: diffuse rhonchi without wheezes or rales. No dullness. Respiratory effort increased  Abdomen: soft, non tender to palpation, positive bowel sounds, no guarding, no rebound  Skin: no rashes or lesions  Musculoskeletal: normal bulk and tone, no joint swelling  Psychiatric: normal mood and affect, A&OX3  Neurologic: CN 2-12 grossly intact, Motor strength 5/5 in all 4 groups with symmetric DTR's and non-focal sensory exam  Labs on Admission:  Basic Metabolic Panel: Recent Labs  Lab 09/10/17 1051  NA 131*  K 4.4  CL 99*  CO2 17*  GLUCOSE 350*  BUN 48*  CREATININE 2.66*  CALCIUM 8.6*   Liver Function Tests: No results for input(s): AST, ALT, ALKPHOS, BILITOT, PROT, ALBUMIN in the last 168 hours. No results for input(s): LIPASE, AMYLASE in the last 168 hours. No results for input(s): AMMONIA in the last 168 hours. CBC: Recent Labs  Lab 09/10/17 1051  WBC 14.4*  HGB 11.5*  HCT 35.8*  MCV 84.4  PLT 126*   Cardiac Enzymes: No results for input(s): CKTOTAL, CKMB, CKMBINDEX, TROPONINI in the last 168  hours.  BNP (last 3 results) No results for input(s): BNP in the last 8760 hours.  ProBNP (last 3 results) No results for input(s): PROBNP in the last 8760 hours.  CBG: No results for input(s): GLUCAP in the last 168 hours.  Radiological Exams on Admission: Dg Chest Port 1 View  Result Date: 09/10/2017 CLINICAL DATA:  Weakness and cough. EXAM: PORTABLE CHEST 1 VIEW COMPARISON:  June 10, 2017 FINDINGS: The heart size and mediastinal contours are within normal limits. Both lungs are clear. The visualized skeletal structures are unremarkable. IMPRESSION: No active disease. Electronically Signed   By: Dorise Bullion III M.D   On: 09/10/2017 12:41    EKG: Independently reviewed.  Assessment/Plan Principal Problem:   SIRS (systemic inflammatory response syndrome) (HCC) Active Problems:   Diabetes mellitus, type 2 (HCC)   Acute renal failure superimposed on chronic kidney disease ([place])   UTI (urinary tract infection)   Will admit to floor with IV fluids and IV  ABX. Begin SVN's. Cultures sent. Follow sugars. Consult PT, wound care, and CSW. Repeat labs in AM  Diet: soft Fluids: NS@75  DVT Prophylaxis: SQ Heparin  Code Status: FULL  Family Communication: yes  Disposition Plan: TBD  Time spent: 50 min

## 2017-09-10 NOTE — Clinical Social Work Note (Signed)
Clinical Social Work Assessment  Patient Details  Name: Blake [name] MRN: [id number] Date of Birth: 02/19/1923  Date of referral:  09/10/17               Reason for consult:  Family Concerns, Discharge Planning                Permission sought to share information with:  Family Supports Permission granted to share information::  Yes, Verbal Permission Granted  Name::     Daughter Magdalene Patricia [phone] Shayne Diguglielmo Son [phone] Sharrell Ku [phone]  Agency::     Relationship::     Contact Information:     Housing/Transportation Living arrangements for the past 2 months:  Single Family Home Source of Information:  Adult Children, Siblings Patient Interpreter Needed:  None Criminal Activity/Legal Involvement Pertinent to Current Situation/Hospitalization:  No - Comment as needed Significant Relationships:  Adult Children, Church, Spouse, Other Family Members, Siblings Lives with:  Spouse, Adult Children Do you feel safe going back to the place where you live?  Yes Need for family participation in patient care:  Yes (Comment)  Care giving concerns:  None at this time   Facilities manager / plan: LCSW introduced myself to family members and patient and  LCSW obtained verbal consent to speak to family members to collect data to complete assessment for patient . He was resting and non responsive. He appeared his stated age.  Blake [name] is a 84 y.o. male has a past medical history significant for DM, HTN, AAA, and prostate cancer now with 1-2 week hx of progressive weakness with anorexia, fever, and cough. In ER, pt tachycardic with UTI and elevated WBC. Also with hyperglycemia and acute on chronic renal failure with dehydration. He is now admitted. Some cough and SOB. No CP. Denies vomiting or diarrhea. Lives at home with daughter. Has not been eating or drinking well. Some fever per daughter   Patient lives with his daughter in a single family home and he is  married to wife for 41 years. They have lived with daughter for 10 months. 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 For the last 5 years he has used his walker and has managed fine. In the last 3 months he doesn't move a lot. To and from bed to bathroom. He has recent poor appetite but is able to feed himself- now struggling more. No diet restrictions at this time some renal issues blood sugar is high and UTI. Patient is hard of hearing and has macular degeneration in his eyes. He is insured by [employer] Its the family wish to have a PT and OT consult and stated that if he does not meet the 3 night in patient criteria for medicare they would like in home supports to be arranged before discharge. LCSW provided support to family and put together several out patient support resources for family that they could access as patient needs change. SNF list and [facility] list and [facility] and day program handouts provided. Discussed the importance of care giver stress and discussed ways to support each other during high stress situations and reviewed self care strategies. Family requested PT and OT consult LCSW consulted with patient room nurse. 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  Emotional Assessment Appearance:  Appears stated age Attitude/Demeanor/Rapport:    Affect (typically observed):  Accepting, Appropriate,  Calm Orientation:   unable to assess- pt sleeping Alcohol / Substance use:   na Psych involvement (Current and /or in the community): None    Discharge Needs  Concerns to be addressed:  Care Coordination Readmission within the last 30 days:  No Current discharge risk:  None Barriers to Discharge:  No Barriers Identified, Continued Medical Work up   [place], LCSW 09/10/2017, 2:56 PM

## 2017-09-10 NOTE — ED Notes (Signed)
Attempted to call report. RN, Leigh, unavailable.

## 2017-09-10 NOTE — ED Triage Notes (Signed)
Pt arrived via EMS from home c/o weakness and cough.  Per EMS, family stated pt is unable to walk with his walker.  Pt has been coughing and family has been treating this at home.  H/o recent UTI and just started on daily abx.  H/o bladder and prostate cancer, HTN, hyperlipidemia, and diabetes. Blood sugar as been elevated at home per EMS, around 370s.

## 2017-09-10 NOTE — Progress Notes (Signed)
Lab called a lactic acid of 2.7. Dr. Doy Hutching was paged and notified of this and was also notified that central tele called to report a "short burst of SVT, rate up to 160s," patient is currently in sinus tachycardia with a rate of 111.

## 2017-09-11 LAB — CBC
HCT: 31.1 % — ABNORMAL LOW (ref 40.0–52.0)
HEMOGLOBIN: 10.3 g/dL — AB (ref 13.0–18.0)
MCH: 27.4 pg (ref 26.0–34.0)
MCHC: 33 g/dL (ref 32.0–36.0)
MCV: 83.1 fL (ref 80.0–100.0)
Platelets: 97 10*3/uL — ABNORMAL LOW (ref 150–440)
RBC: 3.74 MIL/uL — AB (ref 4.40–5.90)
RDW: 16.6 % — ABNORMAL HIGH (ref 11.5–14.5)
WBC: 8.9 10*3/uL (ref 3.8–10.6)

## 2017-09-11 LAB — COMPREHENSIVE METABOLIC PANEL
ALK PHOS: 75 U/L (ref 38–126)
ALT: 12 U/L — ABNORMAL LOW (ref 17–63)
ANION GAP: 9 (ref 5–15)
AST: 16 U/L (ref 15–41)
Albumin: 2.5 g/dL — ABNORMAL LOW (ref 3.5–5.0)
BILIRUBIN TOTAL: 0.8 mg/dL (ref 0.3–1.2)
BUN: 43 mg/dL — ABNORMAL HIGH (ref 6–20)
CALCIUM: 8 mg/dL — AB (ref 8.9–10.3)
CO2: 19 mmol/L — ABNORMAL LOW (ref 22–32)
Chloride: 104 mmol/L (ref 101–111)
Creatinine, Ser: 2.48 mg/dL — ABNORMAL HIGH (ref 0.61–1.24)
GFR calc non Af Amer: 21 mL/min — ABNORMAL LOW (ref >60)
GFR, EST AFRICAN AMERICAN: 24 mL/min — AB (ref >60)
GLUCOSE: 267 mg/dL — AB (ref 65–99)
Potassium: 4.1 mmol/L (ref 3.5–5.1)
Sodium: 132 mmol/L — ABNORMAL LOW (ref 135–145)
TOTAL PROTEIN: 6.5 g/dL (ref 6.5–8.1)

## 2017-09-11 LAB — BLOOD CULTURE ID PANEL (REFLEXED)
ACINETOBACTER BAUMANNII: NOT DETECTED
CANDIDA ALBICANS: NOT DETECTED
CANDIDA GLABRATA: NOT DETECTED
CANDIDA KRUSEI: NOT DETECTED
CANDIDA PARAPSILOSIS: NOT DETECTED
CANDIDA TROPICALIS: NOT DETECTED
Carbapenem resistance: NOT DETECTED
ENTEROBACTER CLOACAE COMPLEX: NOT DETECTED
ESCHERICHIA COLI: NOT DETECTED
Enterobacteriaceae species: DETECTED — AB
Enterococcus species: NOT DETECTED
Haemophilus influenzae: NOT DETECTED
KLEBSIELLA OXYTOCA: NOT DETECTED
KLEBSIELLA PNEUMONIAE: DETECTED — AB
Listeria monocytogenes: NOT DETECTED
NEISSERIA MENINGITIDIS: NOT DETECTED
PROTEUS SPECIES: NOT DETECTED
Pseudomonas aeruginosa: NOT DETECTED
STREPTOCOCCUS AGALACTIAE: NOT DETECTED
Serratia marcescens: NOT DETECTED
Staphylococcus aureus (BCID): NOT DETECTED
Staphylococcus species: NOT DETECTED
Streptococcus pneumoniae: NOT DETECTED
Streptococcus pyogenes: NOT DETECTED
Streptococcus species: NOT DETECTED

## 2017-09-11 LAB — GLUCOSE, CAPILLARY
GLUCOSE-CAPILLARY: 254 mg/dL — AB (ref 65–99)
GLUCOSE-CAPILLARY: 285 mg/dL — AB (ref 65–99)
Glucose-Capillary: 167 mg/dL — ABNORMAL HIGH (ref 65–99)
Glucose-Capillary: 147 mg/dL — ABNORMAL HIGH (ref 65–99)

## 2017-09-11 MED ORDER — CAPSAICIN 0.025 % EX CREA
TOPICAL_CREAM | Freq: Two times a day (BID) | CUTANEOUS | Status: DC
  Administered 2017-09-11 – 2017-09-13 (×5): via TOPICAL
  Filled 2017-09-11 (×2): qty 60

## 2017-09-11 MED ORDER — IPRATROPIUM-ALBUTEROL 0.5-2.5 (3) MG/3ML IN SOLN: 3.0000 mL | RESPIRATORY_TRACT | Status: DC | PRN

## 2017-09-11 MED ORDER — METOPROLOL TARTRATE 25 MG PO TABS
12.5000 mg | ORAL_TABLET | Freq: Two times a day (BID) | ORAL | Status: DC
  Administered 2017-09-11: 12.5 mg via ORAL
  Filled 2017-09-11: qty 1

## 2017-09-11 MED ORDER — SODIUM CHLORIDE 0.9 % IV SOLN
2.0000 g | INTRAVENOUS | Status: DC
  Administered 2017-09-11 – 2017-09-13 (×2): 2 g via INTRAVENOUS
  Filled 2017-09-11 (×3): qty 20

## 2017-09-11 MED ORDER — METOPROLOL TARTRATE 25 MG PO TABS
25.0000 mg | ORAL_TABLET | Freq: Two times a day (BID) | ORAL | Status: DC
  Administered 2017-09-11 – 2017-09-12 (×3): 25 mg via ORAL
  Filled 2017-09-11 (×3): qty 1

## 2017-09-11 NOTE — Progress Notes (Signed)
PHARMACY - PHYSICIAN COMMUNICATION CRITICAL VALUE ALERT - BLOOD CULTURE IDENTIFICATION (BCID)  Blake [name] is an 84 y.o. male who presented to [facility] on 09/10/2017 with a chief complaint of generalized weakness/UTI  Assessment:  Found to have UCx growing >100,000 klebsiella, same species growing in 1/4 BCx GNR Enterobacteriacaea Klebsiella KPC -  Name of physician (or Provider) Contacted: Amelia Jo  Current antibiotics: Ceftriaxone 1g IV q24h  Changes to prescribed antibiotics recommended:  Recommendations accepted by provider -- Will increase to ceftriaxone 2g IV q24h for suspected bacteremia.  Results for orders placed or performed during the hospital encounter of 09/10/17  Blood Culture ID Panel (Reflexed) (Collected: 09/10/2017 12:42 PM)  Result Value Ref Range   Enterococcus species NOT DETECTED NOT DETECTED   Listeria monocytogenes NOT DETECTED NOT DETECTED   Staphylococcus species NOT DETECTED NOT DETECTED   Staphylococcus aureus NOT DETECTED NOT DETECTED   Streptococcus species NOT DETECTED NOT DETECTED   Streptococcus agalactiae NOT DETECTED NOT DETECTED   Streptococcus pneumoniae NOT DETECTED NOT DETECTED   Streptococcus pyogenes NOT DETECTED NOT DETECTED   Acinetobacter baumannii NOT DETECTED NOT DETECTED   Enterobacteriaceae species DETECTED (A) NOT DETECTED   Enterobacter cloacae complex NOT DETECTED NOT DETECTED   Escherichia coli NOT DETECTED NOT DETECTED   Klebsiella oxytoca NOT DETECTED NOT DETECTED   Klebsiella pneumoniae DETECTED (A) NOT DETECTED   Proteus species NOT DETECTED NOT DETECTED   Serratia marcescens NOT DETECTED NOT DETECTED   Carbapenem resistance NOT DETECTED NOT DETECTED   Haemophilus influenzae NOT DETECTED NOT DETECTED   Neisseria meningitidis NOT DETECTED NOT DETECTED   Pseudomonas aeruginosa NOT DETECTED NOT DETECTED   Candida albicans NOT DETECTED NOT DETECTED   Candida glabrata NOT DETECTED NOT DETECTED   Candida krusei NOT  DETECTED NOT DETECTED   Candida parapsilosis NOT DETECTED NOT DETECTED   Candida tropicalis NOT DETECTED NOT DETECTED    Tobie Lords, PharmD, BCPS Clinical Pharmacist 09/11/2017

## 2017-09-11 NOTE — Evaluation (Signed)
Physical Therapy Evaluation Patient Details Name: Blake [name] MRN: [id number] DOB: [birth year] Today's Date: 09/11/2017   History of Present Illness  Blake [name] is a 84yo black male who comes to [facility] p 1-2 weeks feeling ill, weak, malaise, chills, and fever, in ED found to have UTI and tachycardia. Pt is familiar to this PT from prior admission. PMH: macular degeneration, AAA, DM, GIB, HTN, HLD, PrCA, PPM. At baseline pt AMB household distances only, has 24/7 care at [facility] between daughters and caregivers.   Clinical Impression  Pt admitted with above diagnosis. Pt currently with functional limitations due to the deficits listed below (see "PT Problem List"). Upon entry, the patient is received semirecumbent in bed, Son present. The pt is easily awakened and agreeable to participate. No acute distress noted at this time. The pt is conversational, and following simple commands consistently, but does require additional time to respond. HR 105bpm upon arrival, increased to 115-120bpm in AF after 8 minutes AMB: recovers to 110bpm after 2 minutes rest. Functional mobility assessment demonstrates heavy strength impairment in trunk and BLE, the pt now requiring Max assist +2 physical assistance for bed mobility: transfers require near-maximal effort, and AMB is very slow and labored with heavy deficits. Pt perceived no acute impairment, however Son suggests ~25% impairment compared to baseline level of function 1MA. Empirically, the patient demonstrates increased risk of recurrent falls AEB gait speed <0.54m/s and forward reach <5", however no LOB demonstrated throughout session and low falls anxiety. Adequate equipment and caregiver assistance at home for safe return to home with HHPT at [place]. Pt will benefit from skilled PT intervention to increase independence and safety with basic mobility in preparation for discharge to the venue listed below.      Follow Up Recommendations Home health  PT;Supervision/Assistance - 24 hour;Supervision for mobility/OOB    Equipment Recommendations  None recommended by PT    Recommendations for Other Services       Precautions / Restrictions Precautions Precautions: Fall Restrictions Weight Bearing Restrictions: No      Mobility  Bed Mobility Overal bed mobility: Needs Assistance Bed Mobility: Supine to Sit;Sit to Supine     Supine to sit: Max assist;+2 for physical assistance Sit to supine: Max assist;+2 for physical assistance   General bed mobility comments: pt is a heavy, tall man, +2 is helpful d/t weakness   Transfers Overall transfer level: Needs assistance Equipment used: Rolling walker (2 wheeled) Transfers: Sit to/from Stand Sit to Stand: From elevated surface;Min assist;Min guard         General transfer comment: 270 degree turn at bedside prior to sitting requires ~3 minutes. Very slow.   Ambulation/Gait Ambulation/Gait assistance: Supervision;Min guard Ambulation Distance (Feet): 35 Feet Assistive device: Rolling walker (2 wheeled) Gait Pattern/deviations: Step-to pattern Gait velocity: <0.32m/s.    General Gait Details: flexed posture, with step length ~ 3-4 inches, very slow, but appears steady. No falls anxiety, but bilat knee pain upon standing.   Stairs            Wheelchair Mobility    Modified Rankin (Stroke Patients Only)       Balance Overall balance assessment: Modified Independent;Mild deficits observed, not formally tested                                           Pertinent Vitals/Pain Pain Assessment: Faces Faces Pain Scale: Hurts  little more Pain Location: bilat knees upon standing, AMB  Pain Descriptors / Indicators: Aching Pain Intervention(s): Limited activity within patient's tolerance;Monitored during session    [place] expects to be discharged to:: Private residence Living Arrangements: Children;Spouse/significant  other Available Help at Discharge: Family;Personal care attendant;Available 24 hours/day Type of Home: House Home Access: Ramped entrance     Home Layout: Multi-level;Able to live on main level with bedroom/bathroom Home Equipment: Walker - 4 wheels;Cane - single point;Bedside commode;Shower seat;Wheelchair - manual Additional Comments: Pt has a caregiver while daughter away at work.      Prior Function Level of Independence: Needs assistance   Gait / Transfers Assistance Needed: Pt ambualtes household distances with rollator.  No falls in the past 6 months.    ADL's / Homemaking Assistance Needed: Pt has assist from caregivers for bathing, dressing, toiletting.  Daughter does the cooking, cleaning.    Comments: Out of the house for doctor's appointments only.      Hand Dominance   Dominant Hand: Right    Extremity/Trunk Assessment   Upper Extremity Assessment Upper Extremity Assessment: Generalized weakness    Lower Extremity Assessment Lower Extremity Assessment: Generalized weakness    Cervical / Trunk Assessment Cervical / Trunk Assessment: (forward flexed)  Communication   Communication: No difficulties  Cognition Arousal/Alertness: Awake/alert Behavior During Therapy: WFL for tasks assessed/performed Overall Cognitive Status: Within Functional Limits for tasks assessed                                        General Comments      Exercises     Assessment/Plan    PT Assessment Patient needs continued PT services  PT Problem List Decreased activity tolerance;Decreased mobility;Decreased range of motion;Cardiopulmonary status limiting activity       PT Treatment Interventions Gait training;Functional mobility training;Therapeutic activities;Therapeutic exercise;Patient/family education    PT Goals (Current goals can be found in the Care Plan section)  Acute Rehab PT Goals Patient Stated Goal: Regain strength, return to home PT Goal  Formulation: With patient/family Time For Goal Achievement: 09/25/17 Potential to Achieve Goals: Good    Frequency Min 2X/week   Barriers to discharge        Co-evaluation               AM-PAC PT "6 Clicks" Daily Activity  Outcome Measure Difficulty turning over in bed (including adjusting bedclothes, sheets and blankets)?: Unable Difficulty moving from lying on back to sitting on the side of the bed? : Unable Difficulty sitting down on and standing up from a chair with arms (e.g., wheelchair, bedside commode, etc,.)?: Unable Help needed moving to and from a bed to chair (including a wheelchair)?: A Lot Help needed walking in hospital room?: A Lot Help needed climbing 3-5 steps with a railing? : Total 6 Click Score: 8    End of Session Equipment Utilized During Treatment: Gait belt Activity Tolerance: Patient tolerated treatment well;Patient limited by fatigue;Patient limited by lethargy Patient left: in bed;with nursing/sitter in room;with family/visitor present Nurse Communication: Mobility status(pt has made a BM ) PT Visit Diagnosis: Other abnormalities of gait and mobility (R26.89);Difficulty in walking, not elsewhere classified (R26.2);Muscle weakness (generalized) (M62.81)    Time: 8185-6314 PT Time Calculation (min) (ACUTE ONLY): 30 min   Charges:   PT Evaluation $PT Eval Moderate Complexity: 1 Mod PT Treatments $Therapeutic Activity: 8-22 mins   PT  G Codes:       12:24 PM, 09/11/17 Etta Grandchild, PT, DPT Physical Therapist - [facility]  [phone] ([place])     [place] C 09/11/2017, 12:18 PM

## 2017-09-11 NOTE — Progress Notes (Signed)
Patient rhythm changed from sinus tach to A fib then to A flutter and rate went up to 120s then back in 110s. Dr Pryreddy notified and he ordered to give patient's morning dose of Metoprolol now. Metoprolol has been given and will continue to monitor patient.

## 2017-09-11 NOTE — Progress Notes (Signed)
[place] at [place] NAME: Blake [name]    MR#:  [id number]  DATE OF BIRTH:  11-16-1922  SUBJECTIVE:  CHIEF COMPLAINT:   Chief Complaint  Patient presents with  . Weakness   - Admitted with fever and generalized weakness. Noted to have UTI. -Very weak appearing today. Daughter at bedside  REVIEW OF SYSTEMS:  Review of Systems  Constitutional: Positive for malaise/fatigue. Negative for chills, fever and weight loss.  HENT: Positive for ear pain. Negative for congestion, hearing loss and sinus pain.   Eyes: Negative for blurred vision and double vision.  Respiratory: Negative for cough, shortness of breath and wheezing.   Cardiovascular: Negative for chest pain, palpitations and leg swelling.  Gastrointestinal: Negative for abdominal pain, constipation, diarrhea, nausea and vomiting.  Genitourinary: Negative for dysuria.  Musculoskeletal: Negative for myalgias.  Neurological: Negative for dizziness, speech change, focal weakness, seizures and headaches.  Psychiatric/Behavioral: Negative for depression.    DRUG ALLERGIES:   Allergies  Allergen Reactions  . Diovan [Valsartan] Cough  . Gabapentin Other (See Comments)    Sedation at all doses  . Hydralazine Itching  . Lisinopril Cough  . Pregabalin Other (See Comments)    Sedation at all doses  . Levofloxacin Other (See Comments)    Too many side effects. Nausea, vomiting, upset stomach, increased confusion, etc Other reaction(s): Confusion Nausea, chills Nausea, chills   . Sulfamethoxazole-Trimethoprim Other (See Comments)    Too many side effects. Nausea, vomiting, upset stomach, increased confusion, etc    VITALS:  Blood pressure 131/76, pulse (!) 103, temperature 98.3 F (36.8 C), temperature source Oral, resp. rate (!) 24, height 6\' 2"  (1.88 m), weight 99 kg (218 lb 4.1 oz), SpO2 99 %.  PHYSICAL EXAMINATION:  Physical Exam  GENERAL:  84 y.o.-year-old elderly patient  lying in the bed with no acute distress.  EYES: Pupils equal, round, reactive to light and accommodation. No scleral icterus. Extraocular muscles intact.  HEENT: Head atraumatic, normocephalic. Oropharynx and nasopharynx clear.  -Right ear lobe with dry skin and behind the lobe there is a old healed zoster lesion. NECK:  Supple, no jugular venous distention. No thyroid enlargement, no tenderness.  LUNGS: Normal breath sounds bilaterally, no wheezing, rales,rhonchi or crepitation. No use of accessory muscles of respiration. Decreased bibasilar breath sounds CARDIOVASCULAR: S1, S2 normal. No murmurs, rubs, or gallops.  ABDOMEN: Soft, nontender, nondistended. Bowel sounds present. No organomegaly or mass.  EXTREMITIES: No pedal edema, cyanosis, or clubbing.  NEUROLOGIC: Cranial nerves II through XII are intact. Muscle strength 5/5 in all extremities. Sensation intact. Gait not checked. Global weakness noted PSYCHIATRIC: The patient is alert and oriented x 2-3.  SKIN: No obvious rash, lesion, or ulcer.    LABORATORY PANEL:   CBC Recent Labs  Lab 09/11/17 0656  WBC 8.9  HGB 10.3*  HCT 31.1*  PLT 97*   ------------------------------------------------------------------------------------------------------------------  Chemistries  Recent Labs  Lab 09/11/17 0656  NA 132*  K 4.1  CL 104  CO2 19*  GLUCOSE 267*  BUN 43*  CREATININE 2.48*  CALCIUM 8.0*  AST 16  ALT 12*  ALKPHOS 75  BILITOT 0.8   ------------------------------------------------------------------------------------------------------------------  Cardiac Enzymes No results for input(s): TROPONINI in the last 168 hours. ------------------------------------------------------------------------------------------------------------------  RADIOLOGY:  Dg Chest Port 1 View  Result Date: 09/10/2017 CLINICAL DATA:  Weakness and cough. EXAM: PORTABLE CHEST 1 VIEW COMPARISON:  June 10, 2017 FINDINGS: The heart size and  mediastinal contours are within normal  limits. Both lungs are clear. The visualized skeletal structures are unremarkable. IMPRESSION: No active disease. Electronically Signed   By: Dorise Bullion III M.D   On: 09/10/2017 12:41    EKG:   Orders placed or performed during the hospital encounter of 09/10/17  . ED EKG  . ED EKG    ASSESSMENT AND PLAN:   84 year old male with past medical history significant for diabetes, CK D stage IV, hypertension, AAA, history of prostrate cancer who lives at home with family was brought in secondary to worsening weakness and fevers  1. Sepsis-secondary to acute cystitis. -Urine cultures and blood cultures are pending. -Chest x-ray negative for any infection. Continue Rocephin  2. Diabetes mellitus with hyperglycemia-sugars are elevated likely from underlying infection. -Patient on Amaryl, Tradjenta and sliding scale insulin -If no improvement by tomorrow, we'll discontinue glimepiride and start Lantus  3. CKD stage IV-baseline creatinine around 2.5. Stable at this time. Monitor and avoid nephrotoxins  5. 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 Hypertension-on Norvasc, metoprolol  7. DVT prophylaxis heparin subcutaneous heparin   Physical therapy consult requested    All the records are reviewed and case discussed with Care Management/Social Workerr. Management plans discussed with the patient, family and they are in agreement.  CODE STATUS: Full code  TOTAL TIME TAKING CARE OF THIS PATIENT: 38 minutes.   POSSIBLE D/C IN 2 DAYS, DEPENDING ON CLINICAL CONDITION.   Gladstone Lighter M.D on 09/11/2017 at 11:54 AM  Between 7am to 6pm - Pager - [phone]  After 6pm go to [email or web address] - password [email or web address] [place] [facility]  Office  [phone]  CC: Primary care physician; Adin Hector, MD

## 2017-09-12 DIAGNOSIS — L8993 Pressure ulcer of unspecified site, stage 3: Secondary | ICD-10-CM

## 2017-09-12 DIAGNOSIS — L8990 Pressure ulcer of unspecified site, unspecified stage: Secondary | ICD-10-CM

## 2017-09-12 LAB — URINE CULTURE

## 2017-09-12 LAB — BASIC METABOLIC PANEL
ANION GAP: 10 (ref 5–15)
BUN: 41 mg/dL — ABNORMAL HIGH (ref 6–20)
CHLORIDE: 108 mmol/L (ref 101–111)
CO2: 18 mmol/L — AB (ref 22–32)
Calcium: 8.1 mg/dL — ABNORMAL LOW (ref 8.9–10.3)
Creatinine, Ser: 2.22 mg/dL — ABNORMAL HIGH (ref 0.61–1.24)
GFR calc Af Amer: 28 mL/min — ABNORMAL LOW (ref >60)
GFR, EST NON AFRICAN AMERICAN: 24 mL/min — AB (ref >60)
GLUCOSE: 168 mg/dL — AB (ref 65–99)
POTASSIUM: 3.9 mmol/L (ref 3.5–5.1)
Sodium: 136 mmol/L (ref 135–145)

## 2017-09-12 LAB — GLUCOSE, CAPILLARY
GLUCOSE-CAPILLARY: 155 mg/dL — AB (ref 65–99)
GLUCOSE-CAPILLARY: 92 mg/dL (ref 65–99)
Glucose-Capillary: 153 mg/dL — ABNORMAL HIGH (ref 65–99)
Glucose-Capillary: 65 mg/dL (ref 65–99)
Glucose-Capillary: 196 mg/dL — ABNORMAL HIGH (ref 65–99)

## 2017-09-12 MED ORDER — PANTOPRAZOLE SODIUM 40 MG PO TBEC
40.0000 mg | DELAYED_RELEASE_TABLET | Freq: Every day | ORAL | Status: DC
  Administered 2017-09-13: 40 mg via ORAL
  Filled 2017-09-12: qty 1

## 2017-09-12 MED ORDER — DULOXETINE HCL 20 MG PO CPEP
20.0000 mg | ORAL_CAPSULE | Freq: Every day | ORAL | Status: DC
  Administered 2017-09-12 – 2017-09-13 (×2): 20 mg via ORAL
  Filled 2017-09-12 (×2): qty 1

## 2017-09-12 MED ORDER — METOPROLOL TARTRATE 5 MG/5ML IV SOLN: 2.5000 mg | Freq: Four times a day (QID) | INTRAVENOUS | Status: DC | PRN

## 2017-09-12 NOTE — Progress Notes (Signed)
[facility] [place] NAME: Blake [name]    MR#:  [id number]  DATE OF BIRTH:  07-Jun-1923  SUBJECTIVE:  CHIEF COMPLAINT:   Chief Complaint  Patient presents with  . Weakness   - No fevers. More alert than yesterday but still not back to baseline. -Ear pain is improving with capsaicin ointment. -Urine cultures growing Klebsiella and positive blood cultures as well.   REVIEW OF SYSTEMS:  Review of Systems  Constitutional: Positive for malaise/fatigue. Negative for chills, fever and weight loss.  HENT: Positive for ear pain. Negative for congestion, hearing loss and sinus pain.   Eyes: Negative for blurred vision and double vision.  Respiratory: Negative for cough, shortness of breath and wheezing.   Cardiovascular: Negative for chest pain, palpitations and leg swelling.  Gastrointestinal: Negative for abdominal pain, constipation, diarrhea, nausea and vomiting.  Genitourinary: Negative for dysuria.  Musculoskeletal: Negative for myalgias.  Neurological: Negative for dizziness, speech change, focal weakness, seizures and headaches.  Psychiatric/Behavioral: Negative for depression.    DRUG ALLERGIES:   Allergies  Allergen Reactions  . Diovan [Valsartan] Cough  . Gabapentin Other (See Comments)    Sedation at all doses  . Hydralazine Itching  . Lisinopril Cough  . Pregabalin Other (See Comments)    Sedation at all doses  . Levofloxacin Other (See Comments)    Too many side effects. Nausea, vomiting, upset stomach, increased confusion, etc Other reaction(s): Confusion Nausea, chills Nausea, chills   . Sulfamethoxazole-Trimethoprim Other (See Comments)    Too many side effects. Nausea, vomiting, upset stomach, increased confusion, etc    VITALS:  Blood pressure 123/62, pulse 74, temperature 97.6 F (36.4 C), temperature source Oral, resp. rate 20, height 6\' 2"  (1.88 m), weight 100.4 kg (221 lb 5.5 oz), SpO2 99 %.  PHYSICAL  EXAMINATION:  Physical Exam  GENERAL:  84 y.o.-year-old elderly patient lying in the bed with no acute distress.  EYES: Pupils equal, round, reactive to light and accommodation. No scleral icterus. Extraocular muscles intact.  HEENT: Head atraumatic, normocephalic. Oropharynx and nasopharynx clear.  -Right ear lobe with still some tenderness but much improved. NECK:  Supple, no jugular venous distention. No thyroid enlargement, no tenderness.  LUNGS: Normal breath sounds bilaterally, no wheezing, rales,rhonchi or crepitation. No use of accessory muscles of respiration. Decreased bibasilar breath sounds CARDIOVASCULAR: S1, S2 normal. No murmurs, rubs, or gallops.  ABDOMEN: Soft, nontender, nondistended. Bowel sounds present. No organomegaly or mass.  EXTREMITIES: No pedal edema, cyanosis, or clubbing.  NEUROLOGIC: Cranial nerves II through XII are intact. Muscle strength 5/5 in all extremities. Sensation intact. Gait not checked. Global weakness noted PSYCHIATRIC: The patient is alert and oriented x 2-3.  SKIN: No obvious rash, lesion, or ulcer.    LABORATORY PANEL:   CBC Recent Labs  Lab 09/11/17 0656  WBC 8.9  HGB 10.3*  HCT 31.1*  PLT 97*   ------------------------------------------------------------------------------------------------------------------  Chemistries  Recent Labs  Lab 09/11/17 0656 09/12/17 0520  NA 132* 136  K 4.1 3.9  CL 104 108  CO2 19* 18*  GLUCOSE 267* 168*  BUN 43* 41*  CREATININE 2.48* 2.22*  CALCIUM 8.0* 8.1*  AST 16  --   ALT 12*  --   ALKPHOS 75  --   BILITOT 0.8  --    ------------------------------------------------------------------------------------------------------------------  Cardiac Enzymes No results for input(s): TROPONINI in the last 168 hours. ------------------------------------------------------------------------------------------------------------------  RADIOLOGY:  Dg Chest Port 1 View  Result Date:  09/10/2017  CLINICAL DATA:  Weakness and cough. EXAM: PORTABLE CHEST 1 VIEW COMPARISON:  June 10, 2017 FINDINGS: The heart size and mediastinal contours are within normal limits. Both lungs are clear. The visualized skeletal structures are unremarkable. IMPRESSION: No active disease. Electronically Signed   By: Dorise Bullion III M.D   On: 09/10/2017 12:41    EKG:   Orders placed or performed during the hospital encounter of 09/10/17  . ED EKG  . ED EKG    ASSESSMENT AND PLAN:   84 year old male with past medical history significant for diabetes, CK D stage IV, hypertension, AAA, history of prostrate cancer who lives at home with family was brought in secondary to worsening weakness and fevers  1. Sepsis-secondary to acute cystitis and Klebsiella bacteremia -Urine cultures growing Klebsiella and blood cultures are positive for Enterobacter EAC and Klebsiella at this time. -Chest x-ray negative for any infection. Continue Rocephin -ID consult requested for sepsis and bacteremia  2. Diabetes mellitus with hyperglycemia-sugars are elevated likely from underlying infection. -Much improved today -Patient on Amaryl, Tradjenta and sliding scale insulin  3. CKD stage IV-baseline creatinine around 2.5. Stable at this time. Monitor and avoid nephrotoxins  5. 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 Recent nerve block done as well  6. Hypertension-on Norvasc, metoprolol  7. DVT prophylaxis heparin subcutaneous heparin   Physical therapy consult requested- they have recommended home health. -Updated son and daughter at bedside    All the records are reviewed and case discussed with Care Management/Social Workerr. Management plans discussed with the patient, family and they are in agreement.  CODE STATUS: Full code  TOTAL TIME TAKING CARE OF THIS PATIENT: 35 minutes.   POSSIBLE D/C IN 1-2 DAYS, DEPENDING ON CLINICAL  CONDITION.   Gladstone Lighter M.D on 09/12/2017 at 9:32 AM  Between 7am to 6pm - Pager - [phone]  After 6pm go to [email or web address] - password [email or web address] [facility]  Office  [phone]  CC: Primary care physician; Adin Hector, MD

## 2017-09-12 NOTE — Consult Note (Signed)
[facility] Nurse wound consult note Reason for Consult: Right heel pain Wound type: NO wound present.  +3 right dorsalis pedis pulse. Patient states he has had the right medial heel pain for about 2 weeks.  No wounded area detected.  Foam dressing in position and heels being floated off of mattress.  Please continue these approaches as prophylaxis.  Primary RN requested consult on sacral area.  NO wound present to sacral/buttock area.  Sacral foam dressing in place.  Please continue this approach as prophylaxix.  Discussed POC with patient and bedside nurse.  Re consult if needed, will not follow at this time. Thank you,  Val Riles MSN,RN,CWOCN,CNS-BC, [phone])

## 2017-09-12 NOTE — Progress Notes (Signed)
Pharmacy Antibiotic Note  Blake [name] is a 84 y.o. male admitted on 09/10/2017 with UTI and positive BCx.  Pharmacy has been consulted for ceftriaxone dosing.  Plan: Continue Ceftriaxone 2 g IV q24h  Height: 6\' 2"  (188 cm) Weight: 221 lb 5.5 oz (100.4 kg) IBW/kg (Calculated) : 82.2  Temp (24hrs), Avg:98.3 F (36.8 C), Min:97.6 F (36.4 C), Max:99.3 F (37.4 C)  Recent Labs  Lab 09/10/17 1051 09/10/17 1242 09/10/17 1458 09/11/17 0656 09/12/17 0520  WBC 14.4*  --   --  8.9  --   CREATININE 2.66*  --   --  2.48* 2.22*  LATICACIDVEN  --  2.9* 2.7*  --   --     Estimated Creatinine Clearance: 26.9 mL/min (A) (by C-G formula based on SCr of 2.22 mg/dL (H)).    Allergies  Allergen Reactions  . Diovan [Valsartan] Cough  . Gabapentin Other (See Comments)    Sedation at all doses  . Hydralazine Itching  . Lisinopril Cough  . Pregabalin Other (See Comments)    Sedation at all doses  . Levofloxacin Other (See Comments)    Too many side effects. Nausea, vomiting, upset stomach, increased confusion, etc Other reaction(s): Confusion Nausea, chills Nausea, chills   . Sulfamethoxazole-Trimethoprim Other (See Comments)    Too many side effects. Nausea, vomiting, upset stomach, increased confusion, etc    BCx x2 - 1/2 with GNR UCx with Kleb pneumo, susceptibilities pending  Thank you for allowing pharmacy to be a part of this patient's care.  Rocky Morel 09/12/2017 7:47 AM

## 2017-09-12 NOTE — Consult Note (Signed)
[name] Infectious Disease     Reason for Consult: UTI, sepsis     Referring Physician: Claria Dice Date of Admission:  09/10/2017   Principal Problem:   SIRS (systemic inflammatory response syndrome) ([place]) Active Problems:   Diabetes mellitus, type 2 ([place])   Acute renal failure superimposed on chronic kidney disease ([place])   UTI (urinary tract infection)   Pressure injury of skin   HPI: Blake [name] is a 84 y.o. male admitted with AMS and cough and SOB. He has a hx of recurrent UTIs, bladder cancer and follows with Dr Jacqlyn Larsen. He was on suppressive macrobid. Seen by urology a few weeks ago for hematuria. Was advised to have fulguration of bladder for his tumor but not yet scheduled.  On admission wbc 14 was and temp 101.5, UA tntc wbc. UCX and Bcx Klebsiella. Clinically improving.    Past Medical History:  Diagnosis Date  . AAA (abdominal aortic aneurysm) ([place])   . Arrhythmia   . Diabetes mellitus without complication ([place])   . GI bleed   . Heart murmur   . Hyperlipidemia   . Hypertension   . Prostate cancer [facility])    Past Surgical History:  Procedure Laterality Date  . FLEXIBLE SIGMOIDOSCOPY N/A 10/21/2016   Procedure: FLEXIBLE SIGMOIDOSCOPY;  Surgeon: [name], MD;  Location: [facility] ENDOSCOPY;  Service: Endoscopy;  Laterality: N/A;  . JOINT REPLACEMENT    . PACEMAKER INSERTION Left 04/14/2015   Procedure: INSERTION PACEMAKER;  Surgeon: Isaias Cowman, MD;  Location: [facility] ORS;  Service: Cardiovascular;  Laterality: Left;  . PROSTATE SURGERY    . VISCERAL ARTERY INTERVENTION N/A 10/22/2016   Procedure: Visceral Artery Intervention;  Surgeon: Algernon Huxley, MD;  Location: [facility] CV LAB;  Service: Cardiovascular;  Laterality: N/A;   Social History   Tobacco Use  . Smoking status: Former Research scientist (life sciences)  . Smokeless tobacco: Never Used  Substance Use Topics  . Alcohol use: No  . Drug use: No   Family History  Problem Relation Age of Onset  . Hypertension Other    . Stroke Other   . Heart attack Other   . Stroke Sister   . Heart attack Brother     Allergies:  Allergies  Allergen Reactions  . Diovan [Valsartan] Cough  . Gabapentin Other (See Comments)    Sedation at all doses  . Hydralazine Itching  . Lisinopril Cough  . Pregabalin Other (See Comments)    Sedation at all doses  . Levofloxacin Other (See Comments)    Too many side effects. Nausea, vomiting, upset stomach, increased confusion, etc Other reaction(s): Confusion Nausea, chills Nausea, chills   . Sulfamethoxazole-Trimethoprim Other (See Comments)    Too many side effects. Nausea, vomiting, upset stomach, increased confusion, etc    Current antibiotics: Antibiotics Given (last 72 hours)    Date/Time Action Medication Dose Rate   09/10/17 1252 New Bag/Given   cefTRIAXone (ROCEPHIN) 1 g in sodium chloride 0.9 % 100 mL IVPB 1 g 200 mL/hr   09/11/17 1520 New Bag/Given   cefTRIAXone (ROCEPHIN) 1 g in sodium chloride 0.9 % 100 mL IVPB 1 g 200 mL/hr   09/11/17 2342 New Bag/Given   cefTRIAXone (ROCEPHIN) 2 g in sodium chloride 0.9 % 100 mL IVPB 2 g 200 mL/hr      MEDICATIONS: . amLODipine  5 mg Oral Daily  . capsaicin   Topical BID  . docusate sodium  100 mg Oral BID  . DULoxetine  20 mg Oral Daily  .  finasteride  5 mg Oral Daily  . glimepiride  2 mg Oral Q supper  . glimepiride  4 mg Oral Q breakfast  . heparin  5,000 Units Subcutaneous Q8H  . insulin aspart  0-9 Units Subcutaneous TID WC  . linagliptin  5 mg Oral Daily  . metoprolol tartrate  25 mg Oral BID  . multivitamin-lutein  1 capsule Oral BID  . pantoprazole (PROTONIX) IV  40 mg Intravenous Q12H  . rosuvastatin  5 mg Oral QHS  . tamsulosin  0.4 mg Oral QHS    Review of Systems - 11 systems reviewed and negative per HPI   OBJECTIVE: Temp:  [97.6 F (36.4 C)-99.3 F (37.4 C)] 97.6 F (36.4 C) (03/04 0443) Pulse Rate:  [52-109] 52 (03/04 1044) Resp:  [20] 20 (03/04 0443) BP: (102-127)/(49-62) 127/49  (03/04 1044) SpO2:  [97 %-99 %] 99 % (03/04 0443) Weight:  [100.4 kg (221 lb 5.5 oz)] 100.4 kg (221 lb 5.5 oz) (03/04 0443) Physical Exam  Constitutional: He is awake and interactive. Frail.  HENT: anicteric Mouth/Throat: Oropharynx is clear and moist. No oropharyngeal exudate.  Cardiovascular: Normal rate, regular rhythm and normal heart sounds. Exam reveals no gallop and no friction rub.  No murmur heard.  Pulmonary/Chest: Effort normal and breath sounds normal. No respiratory distress. He has no wheezes.  Abdominal: Soft. Bowel sounds are normal. He exhibits no distension. There is no tenderness.  Lymphadenopathy:  He has no cervical adenopathy.  Neurological: He is alert and interactive Skin: Skin is warm and dry. No rash noted. No erythema.  Psychiatric: He has a normal mood and affect. His behavior is normal.    LABS: Results for orders placed or performed during the hospital encounter of 09/10/17 (from the past 48 hour(s))  Lactic acid, plasma     Status: Abnormal   Collection Time: 09/10/17  2:58 PM  Result Value Ref Range   Lactic Acid, Venous 2.7 (HH) 0.5 - 1.9 mmol/L    Comment: CRITICAL RESULT CALLED TO, READ BACK BY AND VERIFIED WITH LEIGH MILES 09/10/17 @ 1542  [place] Performed at [facility], [address] [place], [place] [address]   Glucose, capillary     Status: Abnormal   Collection Time: 09/10/17  5:07 PM  Result Value Ref Range   Glucose-Capillary 278 (H) 65 - 99 mg/dL   Comment 1 Notify RN   Glucose, capillary     Status: Abnormal   Collection Time: 09/10/17  9:17 PM  Result Value Ref Range   Glucose-Capillary 267 (H) 65 - 99 mg/dL  Comprehensive metabolic panel     Status: Abnormal   Collection Time: 09/11/17  6:56 AM  Result Value Ref Range   Sodium 132 (L) 135 - 145 mmol/L   Potassium 4.1 3.5 - 5.1 mmol/L   Chloride 104 101 - 111 mmol/L   CO2 19 (L) 22 - 32 mmol/L   Glucose, Bld 267 (H) 65 - 99 mg/dL   BUN 43 (H) 6 - 20 mg/dL    Creatinine, Ser 2.48 (H) 0.61 - 1.24 mg/dL   Calcium 8.0 (L) 8.9 - 10.3 mg/dL   Total Protein 6.5 6.5 - 8.1 g/dL   Albumin 2.5 (L) 3.5 - 5.0 g/dL   AST 16 15 - 41 U/L   ALT 12 (L) 17 - 63 U/L   Alkaline Phosphatase 75 38 - 126 U/L   Total Bilirubin 0.8 0.3 - 1.2 mg/dL   GFR calc non Af Amer 21 (L) >60 mL/min  GFR calc Af Amer 24 (L) >60 mL/min    Comment: (NOTE) The eGFR has been calculated using the CKD EPI equation. This calculation has not been validated in all clinical situations. eGFR's persistently <60 mL/min signify possible Chronic Kidney Disease.    Anion gap 9 5 - 15    Comment: Performed at [facility], [address] [place], [place] [address]  CBC     Status: Abnormal   Collection Time: 09/11/17  6:56 AM  Result Value Ref Range   WBC 8.9 3.8 - 10.6 K/uL   RBC 3.74 (L) 4.40 - 5.90 MIL/uL   Hemoglobin 10.3 (L) 13.0 - 18.0 g/dL   HCT 31.1 (L) 40.0 - 52.0 %   MCV 83.1 80.0 - 100.0 fL   MCH 27.4 26.0 - 34.0 pg   MCHC 33.0 32.0 - 36.0 g/dL   RDW 16.6 (H) 11.5 - 14.5 %   Platelets 97 (L) 150 - 440 K/uL    Comment: Performed at [facility], [address] [place], [place] [address]  Glucose, capillary     Status: Abnormal   Collection Time: 09/11/17  7:54 AM  Result Value Ref Range   Glucose-Capillary 254 (H) 65 - 99 mg/dL   Comment 1 Notify RN   Glucose, capillary     Status: Abnormal   Collection Time: 09/11/17 11:41 AM  Result Value Ref Range   Glucose-Capillary 285 (H) 65 - 99 mg/dL   Comment 1 Notify RN   Glucose, capillary     Status: Abnormal   Collection Time: 09/11/17  5:14 PM  Result Value Ref Range   Glucose-Capillary 167 (H) 65 - 99 mg/dL   Comment 1 Notify RN   Glucose, capillary     Status: Abnormal   Collection Time: 09/11/17  9:04 PM  Result Value Ref Range   Glucose-Capillary 147 (H) 65 - 99 mg/dL  Basic metabolic panel     Status: Abnormal   Collection Time: 09/12/17  5:20 AM  Result Value Ref Range   Sodium 136 135  - 145 mmol/L   Potassium 3.9 3.5 - 5.1 mmol/L   Chloride 108 101 - 111 mmol/L   CO2 18 (L) 22 - 32 mmol/L   Glucose, Bld 168 (H) 65 - 99 mg/dL   BUN 41 (H) 6 - 20 mg/dL   Creatinine, Ser 2.22 (H) 0.61 - 1.24 mg/dL   Calcium 8.1 (L) 8.9 - 10.3 mg/dL   GFR calc non Af Amer 24 (L) >60 mL/min   GFR calc Af Amer 28 (L) >60 mL/min    Comment: (NOTE) The eGFR has been calculated using the CKD EPI equation. This calculation has not been validated in all clinical situations. eGFR's persistently <60 mL/min signify possible Chronic Kidney Disease.    Anion gap 10 5 - 15    Comment: Performed at [facility], [address] [place], [place] [address]  Glucose, capillary     Status: Abnormal   Collection Time: 09/12/17  8:00 AM  Result Value Ref Range   Glucose-Capillary 153 (H) 65 - 99 mg/dL   Comment 1 Notify RN   Glucose, capillary     Status: Abnormal   Collection Time: 09/12/17 11:51 AM  Result Value Ref Range   Glucose-Capillary 155 (H) 65 - 99 mg/dL   Comment 1 Notify RN    No components found for: ESR, C REACTIVE PROTEIN MICRO: Recent Results (from the past 720 hour(s))  Urine culture  Status: Abnormal   Collection Time: 09/10/17 10:51 AM  Result Value Ref Range Status   Specimen Description   Final    URINE, RANDOM Performed at [facility], [address] [place], [place] [address]    Special Requests   Final    NONE Performed at [facility], [address] [place], [place] [address]    Culture >=100,000 COLONIES/mL KLEBSIELLA PNEUMONIAE (A)  Final   Report Status 09/12/2017 FINAL  Final   Organism ID, Bacteria KLEBSIELLA PNEUMONIAE (A)  Final      Susceptibility   Klebsiella pneumoniae - MIC*    AMPICILLIN >=32 RESISTANT Resistant     CEFAZOLIN <=4 SENSITIVE Sensitive     CEFTRIAXONE <=1 SENSITIVE Sensitive     CIPROFLOXACIN <=0.25 SENSITIVE Sensitive     GENTAMICIN <=1 SENSITIVE Sensitive     IMIPENEM <=0.25 SENSITIVE Sensitive      NITROFURANTOIN 64 INTERMEDIATE Intermediate     TRIMETH/SULFA <=20 SENSITIVE Sensitive     AMPICILLIN/SULBACTAM 16 INTERMEDIATE Intermediate     PIP/TAZO 16 SENSITIVE Sensitive     Extended ESBL NEGATIVE Sensitive     * >=100,000 COLONIES/mL KLEBSIELLA PNEUMONIAE  Culture, blood (routine x 2)     Status: None (Preliminary result)   Collection Time: 09/10/17 12:42 PM  Result Value Ref Range Status   Specimen Description   Final    BLOOD RIGHT ASSIST CONTROL Performed at [facility], [address] [place], [place] [address]    Special Requests   Final    BOTTLES DRAWN AEROBIC AND ANAEROBIC Blood Culture results may not be optimal due to an excessive volume of blood received in culture bottles Performed at [facility], [address] [place], [place] [address]    Culture  Setup Time   Final    GRAM NEGATIVE RODS AEROBIC BOTTLE ONLY CRITICAL RESULT CALLED TO, READ BACK BY AND VERIFIED WITH: Tyland Klemens BESANTI AT 2308 09/11/17.PMH    Culture GRAM NEGATIVE RODS  Final   Report Status PENDING  Incomplete  Culture, blood (routine x 2)     Status: None (Preliminary result)   Collection Time: 09/10/17 12:42 PM  Result Value Ref Range Status   Specimen Description BLOOD LEFT ASSIST CONTROL  Final   Special Requests   Final    BOTTLES DRAWN AEROBIC AND ANAEROBIC Blood Culture results may not be optimal due to an excessive volume of blood received in culture bottles   Culture   Final    NO GROWTH 2 DAYS Performed at [facility], [address] [place], [place] [address]    Report Status PENDING  Incomplete  Blood Culture ID Panel (Reflexed)     Status: Abnormal   Collection Time: 09/10/17 12:42 PM  Result Value Ref Range Status   Enterococcus species NOT DETECTED NOT DETECTED Final   Listeria monocytogenes NOT DETECTED NOT DETECTED Final   Staphylococcus species NOT DETECTED NOT DETECTED Final   Staphylococcus aureus NOT DETECTED NOT DETECTED Final    Streptococcus species NOT DETECTED NOT DETECTED Final   Streptococcus agalactiae NOT DETECTED NOT DETECTED Final   Streptococcus pneumoniae NOT DETECTED NOT DETECTED Final   Streptococcus pyogenes NOT DETECTED NOT DETECTED Final   Acinetobacter baumannii NOT DETECTED NOT DETECTED Final   Enterobacteriaceae species DETECTED (A) NOT DETECTED Final    Comment: Enterobacteriaceae represent a large family of gram-negative bacteria, not a single organism. CRITICAL RESULT CALLED TO, READ BACK BY AND VERIFIED WITH: Dorette Hartel BESANTI AT 2308 09/11/17.PMH  Enterobacter cloacae complex NOT DETECTED NOT DETECTED Final   Escherichia coli NOT DETECTED NOT DETECTED Final   Klebsiella oxytoca NOT DETECTED NOT DETECTED Final   Klebsiella pneumoniae DETECTED (A) NOT DETECTED Final    Comment: CRITICAL RESULT CALLED TO, READ BACK BY AND VERIFIED WITH: Aniken Monestime BESANTI AT 2308 09/11/17.PMH    Proteus species NOT DETECTED NOT DETECTED Final   Serratia marcescens NOT DETECTED NOT DETECTED Final   Carbapenem resistance NOT DETECTED NOT DETECTED Final   Haemophilus influenzae NOT DETECTED NOT DETECTED Final   Neisseria meningitidis NOT DETECTED NOT DETECTED Final   Pseudomonas aeruginosa NOT DETECTED NOT DETECTED Final   Candida albicans NOT DETECTED NOT DETECTED Final   Candida glabrata NOT DETECTED NOT DETECTED Final   Candida krusei NOT DETECTED NOT DETECTED Final   Candida parapsilosis NOT DETECTED NOT DETECTED Final   Candida tropicalis NOT DETECTED NOT DETECTED Final    Comment: Performed at [facility], [address] [place], [place] [address]    IMAGING: Dg Chest Port 1 View  Result Date: 09/10/2017 CLINICAL DATA:  Weakness and cough. EXAM: PORTABLE CHEST 1 VIEW COMPARISON:  June 10, 2017 FINDINGS: The heart size and mediastinal contours are within normal limits. Both lungs are clear. The visualized skeletal structures are unremarkable. IMPRESSION: No active disease. Electronically Signed    By: Dorise Bullion III M.D   On: 09/10/2017 12:41    Assessment:   CIRE CLUTE is a 84 y.o. male admitted with sepsis and found to have Klebsiella UTI and bacteremia. He has a hx of recurrent UTIs and was recently on macrobid suppressive therapu.  He has a history of BPH as well as bladder cancer undergoing repeat cystoscopic evaluation. His case is complicated by CKD and multiple drug and antibiotic intolerances.  He last saw Dr Jacqlyn Larsen approx 2 weeks ago and likely need repeat surgery. He is on flomax and proscar    Recommendations Cont ceftriaxone while inpatient.  When stable for dc would send on oral keflex for 14 days. Then would restart oral macrobid and have him follow up with urology for cystoscopy and fulguration. Thank you very much for allowing me to participate in the care of this patient. Please call with questions.   Cheral Marker. Ola Spurr, MD

## 2017-09-13 LAB — GLUCOSE, CAPILLARY
Glucose-Capillary: 190 mg/dL — ABNORMAL HIGH (ref 65–99)
Glucose-Capillary: 198 mg/dL — ABNORMAL HIGH (ref 65–99)

## 2017-09-13 MED ORDER — SODIUM BICARBONATE 650 MG PO TABS
650.0000 mg | ORAL_TABLET | Freq: Two times a day (BID) | ORAL | Status: DC
  Administered 2017-09-13: 650 mg via ORAL
  Filled 2017-09-13: qty 1

## 2017-09-13 MED ORDER — SODIUM BICARBONATE 650 MG PO TABS: 650.0000 mg | ORAL_TABLET | Freq: Two times a day (BID) | ORAL | 0 refills | Status: DC

## 2017-09-13 MED ORDER — CEPHALEXIN 250 MG PO CAPS: 250.0000 mg | ORAL_CAPSULE | Freq: Two times a day (BID) | ORAL | 0 refills | Status: AC

## 2017-09-13 MED ORDER — METOPROLOL TARTRATE 25 MG PO TABS
25.0000 mg | ORAL_TABLET | Freq: Once | ORAL | Status: AC
  Administered 2017-09-13: 25 mg via ORAL
  Filled 2017-09-13: qty 1

## 2017-09-13 MED ORDER — CAPSAICIN 0.025 % EX CREA: TOPICAL_CREAM | Freq: Two times a day (BID) | CUTANEOUS | 0 refills | Status: DC

## 2017-09-13 MED ORDER — CEPHALEXIN 500 MG PO CAPS: 500.0000 mg | ORAL_CAPSULE | Freq: Three times a day (TID) | ORAL | 0 refills | Status: AC

## 2017-09-13 MED ORDER — CEPHALEXIN 500 MG PO CAPS
500.0000 mg | ORAL_CAPSULE | Freq: Three times a day (TID) | ORAL | Status: DC
  Administered 2017-09-13: 500 mg via ORAL
  Filled 2017-09-13: qty 1

## 2017-09-13 MED ORDER — METOPROLOL TARTRATE 37.5 MG PO TABS: 37.5000 mg | ORAL_TABLET | Freq: Two times a day (BID) | ORAL | 2 refills | Status: DC

## 2017-09-13 MED ORDER — METOPROLOL TARTRATE 25 MG PO TABS: 37.5000 mg | ORAL_TABLET | Freq: Two times a day (BID) | ORAL | Status: DC

## 2017-09-13 NOTE — Progress Notes (Signed)
Physical Therapy Treatment Patient Details Name: Blake [name] MRN: [id number] DOB: 09/27/1922 Today's Date: 09/13/2017    History of Present Illness Blake [name] is a 84yo black male who comes to [facility] p 1-2 weeks feeling ill, weak, malaise, chills, and fever, in ED found to have UTI and tachycardia. Pt is familiar to this PT from prior admission. PMH: macular degeneration, AAA, DM, GIB, HTN, HLD, PrCA, PPM. At baseline pt AMB household distances only, has 24/7 care at [facility] between daughters and caregivers.     PT Comments    Mr. Cozart showed excellent progress with bed mobility, requiring just min guard assist and increased time.  Provided min assist for sit>stand to boost to standing with goal of ambulating; however pt incontinent of bowel and noted that bed was soiled as well.  HR up to 147 with bed mobility and sit>stand transfer.  Pt returned to supine and RN notified of elevated HR and notified of need to be cleaned up.  Recommendation for HHPT and 24/7 assist/supervision remain appropriate.  Pt with much improved mobility for bed mobility and transfers compared to initial PT evaluation and was able to ambulate 35 ft on evaluation.      Follow Up Recommendations  Home health PT;Supervision/Assistance - 24 hour;Supervision for mobility/OOB     Equipment Recommendations  None recommended by PT    Recommendations for Other Services       Precautions / Restrictions Precautions Precautions: Fall Restrictions Weight Bearing Restrictions: No    Mobility  Bed Mobility Overal bed mobility: Needs Assistance Bed Mobility: Supine to Sit;Sit to Supine     Supine to sit: Min guard;HOB elevated Sit to supine: Min guard   General bed mobility comments: Pt require max increased time with increased effort but no physical assist.  Min guard for safety.    Transfers Overall transfer level: Needs assistance Equipment used: Rolling walker (2 wheeled) Transfers: Sit to/from Stand Sit to  Stand: From elevated surface;Min assist         General transfer comment: Slightly elevated bed due to pt's height.  Pt requires assist to boost to standing and is slow to rise.  Pt demonstrates stand>sit with min guard and well controlled descent to sit.   Ambulation/Gait             General Gait Details: Unable to attempt as pt with +incontinent of bowel.   Stairs            Wheelchair Mobility    Modified Rankin (Stroke Patients Only)       Balance Overall balance assessment: Needs assistance Sitting-balance support: No upper extremity supported;Feet supported Sitting balance-Leahy Scale: Good     Standing balance support: Single extremity supported;During functional activity Standing balance-Leahy Scale: Poor Standing balance comment: Pt relies at least 1UE support for static standing                            Cognition Arousal/Alertness: Awake/alert Behavior During Therapy: WFL for tasks assessed/performed Overall Cognitive Status: No family/caregiver present to determine baseline cognitive functioning                                 General Comments: Unsure of pt's baseline.  Pt thought it was the year 2000 and was unble to report the month.  Pt oriented to self, place.       Exercises General  Exercises - Upper Extremity Shoulder Flexion: AROM;Both;10 reps;Supine    General Comments General comments (skin integrity, edema, etc.): HR up to 147 with bed mobility and sit>stand.  Down to low 100's with short rest break.        Pertinent Vitals/Pain Pain Assessment: No/denies pain(no sign of pain) Pain Intervention(s): Monitored during session    Home Living                      Prior Function            PT Goals (current goals can now be found in the care plan section) Acute Rehab PT Goals Patient Stated Goal: Regain strength, return to home Progress towards PT goals: Progressing toward goals     Frequency    Min 2X/week      PT Plan Current plan remains appropriate    Co-evaluation              AM-PAC PT "6 Clicks" Daily Activity  Outcome Measure  Difficulty turning over in bed (including adjusting bedclothes, sheets and blankets)?: A Lot Difficulty moving from lying on back to sitting on the side of the bed? : A Lot Difficulty sitting down on and standing up from a chair with arms (e.g., wheelchair, bedside commode, etc,.)?: Unable Help needed moving to and from a bed to chair (including a wheelchair)?: A Lot Help needed walking in hospital room?: A Lot Help needed climbing 3-5 steps with a railing? : Total 6 Click Score: 10    End of Session Equipment Utilized During Treatment: Gait belt Activity Tolerance: Other (comment)(limited as pt incontinent of bowel with sit>stand) Patient left: in bed;with call bell/phone within reach;with bed alarm set Nurse Communication: Mobility status;Other (comment)(pt needs to be cleaned up; HR) PT Visit Diagnosis: Other abnormalities of gait and mobility (R26.89);Difficulty in walking, not elsewhere classified (R26.2);Muscle weakness (generalized) (M62.81)     Time: 4888-9169 PT Time Calculation (min) (ACUTE ONLY): 17 min  Charges:  $Therapeutic Activity: 8-22 mins                    G Codes:       Collie Siad PT, DPT 09/13/2017, 2:41 PM

## 2017-09-13 NOTE — Progress Notes (Signed)
[place] [facility] [place] NAME: Blake [name]    MR#:  [id number]  DATE OF BIRTH:  September 05, 1922  SUBJECTIVE:  CHIEF COMPLAINT:   Chief Complaint  Patient presents with  . Weakness   - No fevers. Alert and at baseline - Blood cultures and urine cultures growing sensitive Klebsiella  REVIEW OF SYSTEMS:  Review of Systems  Constitutional: Positive for malaise/fatigue. Negative for chills, fever and weight loss.  HENT: Positive for ear pain. Negative for congestion, hearing loss and sinus pain.   Eyes: Negative for blurred vision and double vision.  Respiratory: Negative for cough, shortness of breath and wheezing.   Cardiovascular: Negative for chest pain, palpitations and leg swelling.  Gastrointestinal: Negative for abdominal pain, constipation, diarrhea, nausea and vomiting.  Genitourinary: Negative for dysuria.  Musculoskeletal: Negative for myalgias.  Neurological: Negative for dizziness, speech change, focal weakness, seizures and headaches.  Psychiatric/Behavioral: Negative for depression.    DRUG ALLERGIES:   Allergies  Allergen Reactions  . Diovan [Valsartan] Cough  . Gabapentin Other (See Comments)    Sedation at all doses  . Hydralazine Itching  . Lisinopril Cough  . Pregabalin Other (See Comments)    Sedation at all doses  . Levofloxacin Other (See Comments)    Too many side effects. Nausea, vomiting, upset stomach, increased confusion, etc Other reaction(s): Confusion Nausea, chills Nausea, chills   . Sulfamethoxazole-Trimethoprim Other (See Comments)    Too many side effects. Nausea, vomiting, upset stomach, increased confusion, etc    VITALS:  Blood pressure 129/88, pulse 95, temperature 97.7 F (36.5 C), temperature source Oral, resp. rate 20, height 6\' 2"  (1.88 m), weight 100.9 kg (222 lb 7.1 oz), SpO2 99 %.  PHYSICAL EXAMINATION:  Physical Exam  GENERAL:  84 y.o.-year-old elderly patient lying in the bed  with no acute distress.  EYES: Pupils equal, round, reactive to light and accommodation. No scleral icterus. Extraocular muscles intact.  HEENT: Head atraumatic, normocephalic. Oropharynx and nasopharynx clear.  -Right ear lobe with still some tenderness but much improved. NECK:  Supple, no jugular venous distention. No thyroid enlargement, no tenderness.  LUNGS: Normal breath sounds bilaterally, no wheezing, rales,rhonchi or crepitation. No use of accessory muscles of respiration. Decreased bibasilar breath sounds CARDIOVASCULAR: S1, S2 normal. No murmurs, rubs, or gallops.  ABDOMEN: Soft, nontender, nondistended. Bowel sounds present. No organomegaly or mass.  EXTREMITIES: No pedal edema, cyanosis, or clubbing.  NEUROLOGIC: Cranial nerves II through XII are intact. Muscle strength 5/5 in all extremities. Sensation intact. Gait not checked. Global weakness noted PSYCHIATRIC: The patient is alert and oriented x 3.  SKIN: No obvious rash, lesion, or ulcer.    LABORATORY PANEL:   CBC Recent Labs  Lab 09/11/17 0656  WBC 8.9  HGB 10.3*  HCT 31.1*  PLT 97*   ------------------------------------------------------------------------------------------------------------------  Chemistries  Recent Labs  Lab 09/11/17 0656 09/12/17 0520  NA 132* 136  K 4.1 3.9  CL 104 108  CO2 19* 18*  GLUCOSE 267* 168*  BUN 43* 41*  CREATININE 2.48* 2.22*  CALCIUM 8.0* 8.1*  AST 16  --   ALT 12*  --   ALKPHOS 75  --   BILITOT 0.8  --    ------------------------------------------------------------------------------------------------------------------  Cardiac Enzymes No results for input(s): TROPONINI in the last 168 hours. ------------------------------------------------------------------------------------------------------------------  RADIOLOGY:  No results found.  EKG:   Orders placed or performed during the hospital encounter of 09/10/17  . ED EKG  . ED EKG  ASSESSMENT AND PLAN:    84 year old male with past medical history significant for diabetes, CK D stage IV, hypertension, AAA, history of prostrate cancer who lives at home with family was brought in secondary to worsening weakness and fevers  1. Sepsis-secondary to acute cystitis and Klebsiella bacteremia -Urine cultures growing Klebsiella and blood cultures are positive for Klebsiella too. -Chest x-ray negative for any infection. Continue Rocephin - change to keflex today and discharge for 2 weeks trt followed by a prophylactic dose until he gets a bladder procedure done by his urologist -Appreciate ID consult  2. Diabetes mellitus with hyperglycemia-sugars are elevated likely from underlying infection. -Much improved now -Patient on Amaryl, Tradjenta and sliding scale insulin  3. CKD stage IV-baseline creatinine around 2.5. Stable at this time. Monitor and avoid nephrotoxins -Has low bicarbonate. Will benefit from sodium bicarbonate tablets. Outpatient follow-up with nephrology strongly recommended  5. 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 Recent nerve block done as well  6. Hypertension-on Norvasc, metoprolol  7. A. fib-rate controlled. But has several PVCs. Will benefit from increasing metoprolol dose  8. DVT prophylaxis heparin subcutaneous heparin   Physical therapy consult requested- they have recommended home health. -Updated son and daughter    All the records are reviewed and case discussed with Care Management/Social Workerr. Management plans discussed with the patient, family and they are in agreement.  CODE STATUS: Full code  TOTAL TIME TAKING CARE OF THIS PATIENT: 38 minutes.   POSSIBLE D/C TODAY, DEPENDING ON CLINICAL CONDITION.   Gladstone Lighter M.D on 09/13/2017 at 10:57 AM  Between 7am to 6pm - Pager - [phone]  After 6pm go to [email or web address] - password [email or web address] [facility]  Office   [phone]  CC: Primary care physician; Adin Hector, MD

## 2017-09-13 NOTE — Care Management (Signed)
Patient admitted from home with sepsis.  Patient lives at home with his daughter and wife.  There is a sitter in the home during the day, while daughter works.  Son is at bedside, he lives locally for support.  PCP Caryl Comes.  Patient has RW,Cane, Bsc, WC,  and shower seat in the home.  Patient has been assessed by PT and recommends home health PT.  Patient has been open previously open with [facility] and family wishes to use them again.  [facility] services have approved by Dr. Doy Hutching and [facility] department director.  Son in agreement and has confirmed with patients daughter.  Referral made to [facility] with [facility].

## 2017-09-13 NOTE — Progress Notes (Signed)
Discharge teaching given to patient and son, son  verbalized understanding and had no questions. Patient IV's removed. Patient will be transported home by family. All patient belongings gathered prior to leaving.

## 2017-09-14 ENCOUNTER — Ambulatory Visit: Payer: Self-pay | Admitting: Urology

## 2017-09-14 LAB — CULTURE, BLOOD (ROUTINE X 2)

## 2017-09-14 NOTE — Discharge Summary (Signed)
[facility] NAME: Blake [name]    MR#:  [id number]  DATE OF BIRTH:  04-09-1923  DATE OF ADMISSION:  09/10/2017   ADMITTING PHYSICIAN: Idelle Crouch, MD  DATE OF DISCHARGE: 09/13/2017  5:03 PM  PRIMARY CARE PHYSICIAN: Adin Hector, MD   ADMISSION DIAGNOSIS:   Hyperglycemia [R73.9] Sepsis, due to unspecified organism ([place]) [A41.9] Urinary tract infection without hematuria, site unspecified [N39.0] Chronic renal failure, unspecified CKD stage [N18.9]  DISCHARGE DIAGNOSIS:   Principal Problem:   SIRS (systemic inflammatory response syndrome) (HCC) Active Problems:   Diabetes mellitus, type 2 (HCC)   Acute renal failure superimposed on chronic kidney disease (HCC)   UTI (urinary tract infection)   Pressure injury of skin   SECONDARY DIAGNOSIS:   Past Medical History:  Diagnosis Date  . AAA (abdominal aortic aneurysm) ([place])   . Arrhythmia   . Diabetes mellitus without complication ([place])   . GI bleed   . Heart murmur   . Hyperlipidemia   . Hypertension   . Prostate cancer [facility])     HOSPITAL COURSE:   84 year old male with past medical history significant for diabetes, CKD stage IV, hypertension, AAA, history of prostrate cancer who lives at home with family was brought in secondary to worsening weakness and fevers  1. Sepsis-secondary to acute cystitis and Klebsiella bacteremia -Urine cultures growing Klebsiella and blood cultures growing the same too -Chest x-ray negative for any infection. Received Rocephin - changed to keflex and discharge for 2 weeks trt followed by a prophylactic dose until he gets a bladder procedure done by his urologist -Appreciate ID consult  2. Diabetes mellitus with hyperglycemia-sugars are elevated likely from underlying infection. -Much improved now -Patient on Amaryl, januvia at home  3. CKD stage IV-baseline creatinine around 2.5. Stable at this time. Monitor and avoid  nephrotoxins -Has low bicarbonate. Started on sodium bicarbonate tablets. Outpatient follow-up with nephrology strongly recommended  5. 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 Recent nerve block done as well  6. Hypertension-on metoprolol.  Will also help his sinus tachycardia  7. A. fib-rate controlled. But has several PVCs.  Slightly increased metoprolol dose at discharge  Physical therapy consult requested- they have recommended home health. -Updated son and daughter    DISCHARGE CONDITIONS:   Guarded  CONSULTS OBTAINED:   Treatment Team:  Leonel Ramsay, MD  DRUG ALLERGIES:   Allergies  Allergen Reactions  . Diovan [Valsartan] Cough  . Gabapentin Other (See Comments)    Sedation at all doses  . Hydralazine Itching  . Lisinopril Cough  . Pregabalin Other (See Comments)    Sedation at all doses  . Levofloxacin Other (See Comments)    Too many side effects. Nausea, vomiting, upset stomach, increased confusion, etc Other reaction(s): Confusion Nausea, chills Nausea, chills   . Sulfamethoxazole-Trimethoprim Other (See Comments)    Too many side effects. Nausea, vomiting, upset stomach, increased confusion, etc   DISCHARGE MEDICATIONS:   Allergies as of 09/13/2017      Reactions   Diovan [valsartan] Cough   Gabapentin Other (See Comments)   Sedation at all doses   Hydralazine Itching   Lisinopril Cough   Pregabalin Other (See Comments)   Sedation at all doses   Levofloxacin Other (See Comments)   Too many side effects. Nausea, vomiting, upset stomach, increased confusion, etc Other reaction(s): Confusion Nausea, chills Nausea, chills   Sulfamethoxazole-trimethoprim Other (See  Comments)   Too many side effects. Nausea, vomiting, upset stomach, increased confusion, etc      Medication List    STOP taking these medications   nitrofurantoin (macrocrystal-monohydrate) 100 MG capsule Commonly known as:   MACROBID   ranitidine 150 MG tablet Commonly known as:  ZANTAC     TAKE these medications   azelastine 0.1 % nasal spray Commonly known as:  ASTELIN Place 1 spray into both nostrils 2 (two) times daily. Use in each nostril as directed   capsaicin 0.025 % cream Commonly known as:  ZOSTRIX Apply topically 2 (two) times daily.   cephALEXin 500 MG capsule Commonly known as:  KEFLEX Take 1 capsule (500 mg total) by mouth every 8 (eight) hours for 14 days.   cephALEXin 250 MG capsule Commonly known as:  KEFLEX Take 1 capsule (250 mg total) by mouth 2 (two) times daily for 20 days. START THIS AFTER FINISHING THE 2 WEEK TREATMENT COURSE Start taking on:  09/27/2017   DULoxetine 20 MG capsule Commonly known as:  CYMBALTA Take 20 mg by mouth daily.   finasteride 5 MG tablet Commonly known as:  PROSCAR Take 5 mg by mouth daily.   glimepiride 2 MG tablet Commonly known as:  AMARYL 2 tablets in the morning and one tablet at night What changed:    how much to take  how to take this  when to take this  additional instructions   guaiFENesin-dextromethorphan 100-10 MG/5ML syrup Commonly known as:  ROBITUSSIN DM Take 5 mLs by mouth every 4 (four) hours as needed for cough.   Metoprolol Tartrate 37.5 MG Tabs Take 37.5 mg by mouth 2 (two) times daily. What changed:    medication strength  how much to take   pantoprazole 40 MG tablet Commonly known as:  PROTONIX Take 40 mg by mouth daily.   PRESERVISION/LUTEIN PO Take 1 capsule by mouth 2 (two) times daily.   multivitamin-iron-minerals-folic acid chewable tablet Chew 2 tablets by mouth daily.   rosuvastatin 5 MG tablet Commonly known as:  CRESTOR Take 5 mg by mouth at bedtime.   sitaGLIPtin 50 MG tablet Commonly known as:  JANUVIA Take 50 mg by mouth daily.   sodium bicarbonate 650 MG tablet Take 1 tablet (650 mg total) by mouth 2 (two) times daily.   tamsulosin 0.4 MG Caps capsule Commonly known as:   FLOMAX Take 0.4 mg by mouth 2 (two) times daily.   triamcinolone cream 0.5 % Commonly known as:  KENALOG Apply 1 application topically 2 (two) times daily.   VITRON-C PO Take 1 tablet by mouth daily.        DISCHARGE INSTRUCTIONS:   1. PCP f/u in 1-2 weeks 2. Urology f/u in 2 weeks  DIET:   Cardiac diet  ACTIVITY:   Activity as tolerated  OXYGEN:   Home Oxygen: No.  Oxygen Delivery: room air  DISCHARGE LOCATION:   home   If you experience worsening of your admission symptoms, develop shortness of breath, life threatening emergency, suicidal or homicidal thoughts you must seek medical attention immediately by calling 911 or calling your MD immediately  if symptoms less severe.  You Must read complete instructions/literature along with all the possible adverse reactions/side effects for all the Medicines you take and that have been prescribed to you. Take any new Medicines after you have completely understood and accpet all the possible adverse reactions/side effects.   Please note  You were cared for by a hospitalist during your hospital  stay. If you have any questions about your discharge medications or the care you received while you were in the hospital after you are discharged, you can call the unit and asked to speak with the hospitalist on call if the hospitalist that took care of you is not available. Once you are discharged, your primary care physician will handle any further medical issues. Please note that NO REFILLS for any discharge medications will be authorized once you are discharged, as it is imperative that you return to your primary care physician (or establish a relationship with a primary care physician if you do not have one) for your aftercare needs so that they can reassess your need for medications and monitor your lab values.    On the day of Discharge:  VITAL SIGNS:   Blood pressure 135/80, pulse 62, temperature 97.6 F (36.4 C), temperature  source Oral, resp. rate 18, height 6\' 2"  (1.88 m), weight 100.9 kg (222 lb 7.1 oz), SpO2 99 %.  PHYSICAL EXAMINATION:    GENERAL:  84 y.o.-year-old elderly patient lying in the bed with no acute distress.  EYES: Pupils equal, round, reactive to light and accommodation. No scleral icterus. Extraocular muscles intact.  HEENT: Head atraumatic, normocephalic. Oropharynx and nasopharynx clear.  -Right ear lobe with still some tenderness but much improved. NECK:  Supple, no jugular venous distention. No thyroid enlargement, no tenderness.  LUNGS: Normal breath sounds bilaterally, no wheezing, rales,rhonchi or crepitation. No use of accessory muscles of respiration. Decreased bibasilar breath sounds CARDIOVASCULAR: S1, S2 normal. No murmurs, rubs, or gallops.  ABDOMEN: Soft, nontender, nondistended. Bowel sounds present. No organomegaly or mass.  EXTREMITIES: No pedal edema, cyanosis, or clubbing.  NEUROLOGIC: Cranial nerves II through XII are intact. Muscle strength 5/5 in all extremities. Sensation intact. Gait not checked. Global weakness noted PSYCHIATRIC: The patient is alert and oriented x 3.  SKIN: No obvious rash, lesion, or ulcer.     DATA REVIEW:   CBC Recent Labs  Lab 09/11/17 0656  WBC 8.9  HGB 10.3*  HCT 31.1*  PLT 97*    Chemistries  Recent Labs  Lab 09/11/17 0656 09/12/17 0520  NA 132* 136  K 4.1 3.9  CL 104 108  CO2 19* 18*  GLUCOSE 267* 168*  BUN 43* 41*  CREATININE 2.48* 2.22*  CALCIUM 8.0* 8.1*  AST 16  --   ALT 12*  --   ALKPHOS 75  --   BILITOT 0.8  --      Microbiology Results  Results for orders placed or performed during the hospital encounter of 09/10/17  Urine culture     Status: Abnormal   Collection Time: 09/10/17 10:51 AM  Result Value Ref Range Status   Specimen Description   Final    URINE, RANDOM Performed at [facility], [address], [place], [place] [address]    Special Requests   Final    NONE Performed at  [facility], [address] [place], [place] [address]    Culture >=100,000 COLONIES/mL KLEBSIELLA PNEUMONIAE (A)  Final   Report Status 09/12/2017 FINAL  Final   Organism ID, Bacteria KLEBSIELLA PNEUMONIAE (A)  Final      Susceptibility   Klebsiella pneumoniae - MIC*    AMPICILLIN >=32 RESISTANT Resistant     CEFAZOLIN <=4 SENSITIVE Sensitive     CEFTRIAXONE <=1 SENSITIVE Sensitive     CIPROFLOXACIN <=0.25 SENSITIVE Sensitive     GENTAMICIN <=1 SENSITIVE Sensitive     IMIPENEM <=  0.25 SENSITIVE Sensitive     NITROFURANTOIN 64 INTERMEDIATE Intermediate     TRIMETH/SULFA <=20 SENSITIVE Sensitive     AMPICILLIN/SULBACTAM 16 INTERMEDIATE Intermediate     PIP/TAZO 16 SENSITIVE Sensitive     Extended ESBL NEGATIVE Sensitive     * >=100,000 COLONIES/mL KLEBSIELLA PNEUMONIAE  Culture, blood (routine x 2)     Status: Abnormal   Collection Time: 09/10/17 12:42 PM  Result Value Ref Range Status   Specimen Description   Final    BLOOD RIGHT ASSIST CONTROL Performed at [facility], [address] [place], [place] [address]    Special Requests   Final    BOTTLES DRAWN AEROBIC AND ANAEROBIC Blood Culture results may not be optimal due to an excessive volume of blood received in culture bottles Performed at [facility], [address] [place], [place] [address]    Culture  Setup Time   Final    GRAM NEGATIVE RODS AEROBIC BOTTLE ONLY CRITICAL RESULT CALLED TO, READ BACK BY AND VERIFIED WITH: DAVID BESANTI AT 2308 09/11/17.PMH    Culture KLEBSIELLA PNEUMONIAE (A)  Final   Report Status 09/14/2017 FINAL  Final   Organism ID, Bacteria KLEBSIELLA PNEUMONIAE  Final      Susceptibility   Klebsiella pneumoniae - MIC*    AMPICILLIN >=32 RESISTANT Resistant     CEFAZOLIN <=4 SENSITIVE Sensitive     CEFEPIME <=1 SENSITIVE Sensitive     CEFTAZIDIME <=1 SENSITIVE Sensitive     CEFTRIAXONE <=1 SENSITIVE Sensitive     CIPROFLOXACIN <=0.25 SENSITIVE Sensitive      GENTAMICIN <=1 SENSITIVE Sensitive     IMIPENEM <=0.25 SENSITIVE Sensitive     TRIMETH/SULFA <=20 SENSITIVE Sensitive     AMPICILLIN/SULBACTAM 16 INTERMEDIATE Intermediate     PIP/TAZO 16 SENSITIVE Sensitive     Extended ESBL NEGATIVE Sensitive     * KLEBSIELLA PNEUMONIAE  Culture, blood (routine x 2)     Status: None (Preliminary result)   Collection Time: 09/10/17 12:42 PM  Result Value Ref Range Status   Specimen Description BLOOD LEFT ASSIST CONTROL  Final   Special Requests   Final    BOTTLES DRAWN AEROBIC AND ANAEROBIC Blood Culture results may not be optimal due to an excessive volume of blood received in culture bottles   Culture  Setup Time   Final    GRAM NEGATIVE RODS AEROBIC BOTTLE ONLY CRITICAL VALUE NOTED.  VALUE IS CONSISTENT WITH PREVIOUSLY REPORTED AND CALLED VALUE. Performed at [facility], [address] [place],  [address]    Culture GRAM NEGATIVE RODS  Final   Report Status PENDING  Incomplete  Blood Culture ID Panel (Reflexed)     Status: Abnormal   Collection Time: 09/10/17 12:42 PM  Result Value Ref Range Status   Enterococcus species NOT DETECTED NOT DETECTED Final   Listeria monocytogenes NOT DETECTED NOT DETECTED Final   Staphylococcus species NOT DETECTED NOT DETECTED Final   Staphylococcus aureus NOT DETECTED NOT DETECTED Final   Streptococcus species NOT DETECTED NOT DETECTED Final   Streptococcus agalactiae NOT DETECTED NOT DETECTED Final   Streptococcus pneumoniae NOT DETECTED NOT DETECTED Final   Streptococcus pyogenes NOT DETECTED NOT DETECTED Final   Acinetobacter baumannii NOT DETECTED NOT DETECTED Final   Enterobacteriaceae species DETECTED (A) NOT DETECTED Final    Comment: Enterobacteriaceae represent a large family of gram-negative bacteria, not a single organism. CRITICAL RESULT CALLED TO, READ BACK BY AND VERIFIED WITH: DAVID BESANTI AT 2308 09/11/17.PMH  Enterobacter cloacae complex NOT DETECTED NOT DETECTED Final    Escherichia coli NOT DETECTED NOT DETECTED Final   Klebsiella oxytoca NOT DETECTED NOT DETECTED Final   Klebsiella pneumoniae DETECTED (A) NOT DETECTED Final    Comment: CRITICAL RESULT CALLED TO, READ BACK BY AND VERIFIED WITH: DAVID BESANTI AT 2308 09/11/17.PMH    Proteus species NOT DETECTED NOT DETECTED Final   Serratia marcescens NOT DETECTED NOT DETECTED Final   Carbapenem resistance NOT DETECTED NOT DETECTED Final   Haemophilus influenzae NOT DETECTED NOT DETECTED Final   Neisseria meningitidis NOT DETECTED NOT DETECTED Final   Pseudomonas aeruginosa NOT DETECTED NOT DETECTED Final   Candida albicans NOT DETECTED NOT DETECTED Final   Candida glabrata NOT DETECTED NOT DETECTED Final   Candida krusei NOT DETECTED NOT DETECTED Final   Candida parapsilosis NOT DETECTED NOT DETECTED Final   Candida tropicalis NOT DETECTED NOT DETECTED Final    Comment: Performed at [facility], [address], [place], [place] [address]    RADIOLOGY:  No results found.   Management plans discussed with the patient, family and they are in agreement.  CODE STATUS:  Code Status History    Date Active Date Inactive Code Status Order ID Comments User Context   09/10/2017 14:30 09/13/2017 21:15 Full Code [id number]  Idelle Crouch, MD Inpatient   06/10/2017 23:04 06/12/2017 19:08 Full Code [id number]  Lance Coon, MD Inpatient   05/17/2017 08:29 05/19/2017 15:25 Full Code [id number]  Hillary Bow, MD ED   03/31/2017 13:34 04/03/2017 16:11 Full Code [id number]  Nicholes Mango, MD Inpatient   10/21/2016 01:55 10/25/2016 21:45 Full Code [id number]  Harvie Bridge, DO Inpatient   10/09/2016 23:42 10/13/2016 20:09 Full Code [id number]  Vaughan Basta, MD Inpatient   04/14/2015 14:32 04/15/2015 17:39 Full Code [id number]  Isaias Cowman, MD Inpatient   04/09/2015 00:22 04/14/2015 14:32 Full Code [id number]  Hower, Aaron Mose, MD ED    Advance Directive Documentation     Most Recent Value  Type of  Advance Directive  Healthcare Power of Attorney, Living will  Pre-existing out of facility DNR order (yellow form or pink MOST form)  No data  "MOST" Form in Place?  No data      TOTAL TIME TAKING CARE OF THIS PATIENT: 38 minutes.    Gladstone Lighter M.D on 09/14/2017 at 4:25 PM  Between 7am to 6pm - Pager - [phone]  After 6pm go to [email or web address] - password [email or web address]  [facility]  Office  [phone]  CC: Primary care physician; Adin Hector, MD   Note: This dictation was prepared with Dragon dictation along with smaller phrase technology. Any transcriptional errors that result from this process are unintentional.

## 2017-09-16 LAB — CULTURE, BLOOD (ROUTINE X 2)

## 2017-09-21 ENCOUNTER — Ambulatory Visit: Payer: Medicare Other | Admitting: Urology

## 2017-09-21 VITALS — BP 174/90 | HR 67 | Ht 74.0 in | Wt 221.0 lb

## 2017-09-21 DIAGNOSIS — C679 Malignant neoplasm of bladder, unspecified: Secondary | ICD-10-CM

## 2017-09-21 DIAGNOSIS — N390 Urinary tract infection, site not specified: Secondary | ICD-10-CM | POA: Diagnosis not present

## 2017-09-26 ENCOUNTER — Encounter: Payer: Self-pay | Admitting: Urology

## 2017-09-26 DIAGNOSIS — N390 Urinary tract infection, site not specified: Secondary | ICD-10-CM | POA: Insufficient documentation

## 2017-09-26 DIAGNOSIS — C679 Malignant neoplasm of bladder, unspecified: Secondary | ICD-10-CM | POA: Insufficient documentation

## 2017-09-26 NOTE — Progress Notes (Signed)
09/21/2017 10:23 AM   Blake [name] 1923/01/24 [id number]  Referring provider: Adin Hector, MD [place] [facility] [place], [place] [address]  Chief Complaint  Patient presents with  . Hematuria    HPI: 84 year old male presents for hospital follow-up.  He was admitted to the hospital in early March with sepsis and Klebsiella bacteremia secondary to UTI.  He has most recently been followed by Dr. Jacqlyn Larsen at [facility] for urothelial carcinoma and recurrent UTIs.  A cystoscopy in November 2018 showed recurrent tumor in the bladder.  He last saw Dr. Jacqlyn Larsen in February 2019 and endoscopic treatment was recommended as soon as his infections were clear.  His main complaint is significant weakness since his last hospitalization.  The patient and his family have elected to transfer care to the [facility].  He is on tamsulosin and finasteride.  He also has a history of prostate cancer.   PMH: Past Medical History:  Diagnosis Date  . AAA (abdominal aortic aneurysm) ([place])   . Arrhythmia   . Diabetes mellitus without complication ([place])   . GI bleed   . Heart murmur   . Hyperlipidemia   . Hypertension   . Prostate cancer [facility])     Surgical History: Past Surgical History:  Procedure Laterality Date  . FLEXIBLE SIGMOIDOSCOPY N/A 10/21/2016   Procedure: FLEXIBLE SIGMOIDOSCOPY;  Surgeon: [name], MD;  Location: [facility] ENDOSCOPY;  Service: Endoscopy;  Laterality: N/A;  . JOINT REPLACEMENT    . PACEMAKER INSERTION Left 04/14/2015   Procedure: INSERTION PACEMAKER;  Surgeon: Isaias Cowman, MD;  Location: [facility] ORS;  Service: Cardiovascular;  Laterality: Left;  . PROSTATE SURGERY    . VISCERAL ARTERY INTERVENTION N/A 10/22/2016   Procedure: Visceral Artery Intervention;  Surgeon: Algernon Huxley, MD;  Location: [facility] CV LAB;  Service: Cardiovascular;  Laterality: N/A;    Home Medications:  Allergies as of 09/21/2017      Reactions   Diovan [valsartan] Cough   Gabapentin Other (See Comments)   Sedation at all doses   Hydralazine Itching   Lisinopril Cough   Pregabalin Other (See Comments)   Sedation at all doses   Levofloxacin Other (See Comments)   Too many side effects. Nausea, vomiting, upset stomach, increased confusion, etc Other reaction(s): Confusion Nausea, chills Nausea, chills   Sulfamethoxazole-trimethoprim Other (See Comments)   Too many side effects. Nausea, vomiting, upset stomach, increased confusion, etc      Medication List        Accurate as of 09/21/17 11:59 PM. Always use your most recent med list.          azelastine 0.1 % nasal spray Commonly known as:  ASTELIN Place 1 spray into both nostrils 2 (two) times daily. Use in each nostril as directed   capsaicin 0.025 % cream Commonly known as:  ZOSTRIX Apply topically 2 (two) times daily.   cephALEXin 500 MG capsule Commonly known as:  KEFLEX Take 1 capsule (500 mg total) by mouth every 8 (eight) hours for 14 days.   cephALEXin 250 MG capsule Commonly known as:  KEFLEX Take 1 capsule (250 mg total) by mouth 2 (two) times daily for 20 days. START THIS AFTER FINISHING THE 2 WEEK TREATMENT COURSE Start taking on:  09/27/2017   DULoxetine 20 MG capsule Commonly known as:  CYMBALTA Take 20 mg by mouth daily.   finasteride 5 MG tablet Commonly known as:  PROSCAR Take 5 mg by mouth daily.   glimepiride  2 MG tablet Commonly known as:  AMARYL 2 tablets in the morning and one tablet at night   guaiFENesin-dextromethorphan 100-10 MG/5ML syrup Commonly known as:  ROBITUSSIN DM Take 5 mLs by mouth every 4 (four) hours as needed for cough.   Metoprolol Tartrate 37.5 MG Tabs Take 37.5 mg by mouth 2 (two) times daily.   pantoprazole 40 MG tablet Commonly known as:  PROTONIX Take 40 mg by mouth daily.   PRESERVISION/LUTEIN PO Take 1 capsule by mouth 2 (two) times daily.   multivitamin-iron-minerals-folic acid chewable tablet Chew 2 tablets by mouth daily.    rosuvastatin 5 MG tablet Commonly known as:  CRESTOR Take 5 mg by mouth at bedtime.   sitaGLIPtin 50 MG tablet Commonly known as:  JANUVIA Take 50 mg by mouth daily.   sodium bicarbonate 650 MG tablet Take 1 tablet (650 mg total) by mouth 2 (two) times daily.   tamsulosin 0.4 MG Caps capsule Commonly known as:  FLOMAX Take 0.4 mg by mouth 2 (two) times daily.   triamcinolone cream 0.5 % Commonly known as:  KENALOG Apply 1 application topically 2 (two) times daily.   VITRON-C PO Take 1 tablet by mouth daily.       Allergies:  Allergies  Allergen Reactions  . Diovan [Valsartan] Cough  . Gabapentin Other (See Comments)    Sedation at all doses  . Hydralazine Itching  . Lisinopril Cough  . Pregabalin Other (See Comments)    Sedation at all doses  . Levofloxacin Other (See Comments)    Too many side effects. Nausea, vomiting, upset stomach, increased confusion, etc Other reaction(s): Confusion Nausea, chills Nausea, chills   . Sulfamethoxazole-Trimethoprim Other (See Comments)    Too many side effects. Nausea, vomiting, upset stomach, increased confusion, etc    Family History: Family History  Problem Relation Age of Onset  . Hypertension Other   . Stroke Other   . Heart attack Other   . Stroke Sister   . Heart attack Brother     Social History:  reports that he has quit smoking. he has never used smokeless tobacco. He reports that he does not drink alcohol or use drugs.  ROS: UROLOGY Frequent Urination?: Yes Hard to postpone urination?: No Burning/pain with urination?: Yes Get up at night to urinate?: Yes Leakage of urine?: Yes Urine stream starts and stops?: No Trouble starting stream?: Yes Do you have to strain to urinate?: Yes Blood in urine?: Yes Urinary tract infection?: No Sexually transmitted disease?: No Injury to kidneys or bladder?: No Painful intercourse?: No Weak stream?: No Erection problems?: No Penile pain?:  No  Gastrointestinal Nausea?: No Vomiting?: No Indigestion/heartburn?: No Diarrhea?: No Constipation?: No  Constitutional Fever: No Night sweats?: No Weight loss?: No Fatigue?: No  Skin Skin rash/lesions?: No Itching?: No  Eyes Blurred vision?: No Double vision?: No  Ears/Nose/Throat Sore throat?: No Sinus problems?: No  Hematologic/Lymphatic Swollen glands?: No Easy bruising?: No  Cardiovascular Leg swelling?: No Chest pain?: No  Respiratory Cough?: Yes Shortness of breath?: No  Endocrine Excessive thirst?: No  Musculoskeletal Back pain?: No Joint pain?: No  Neurological Headaches?: No Dizziness?: No  Psychologic Depression?: No Anxiety?: No  Physical Exam: BP (!) 174/90   Pulse 67   Ht 6\' 2"  (1.88 m)   Wt 221 lb (100.2 kg)   BMI 28.37 kg/m   Constitutional:  Alert and oriented, No acute distress. HEENT: [place] AT, moist mucus membranes.  Trachea midline, no masses. Cardiovascular: No clubbing, cyanosis, or  edema. Respiratory: Normal respiratory effort, no increased work of breathing. GI: Abdomen is soft, nontender, nondistended, no abdominal masses GU: No CVA tenderness Lymph: No cervical or inguinal lymphadenopathy. Skin: No rashes, bruises or suspicious lesions. Neurologic: Grossly intact, no focal deficits, moving all 4 extremities. Psychiatric: Normal mood and affect.  Laboratory Data: Lab Results  Component Value Date   WBC 8.9 09/11/2017   HGB 10.3 (L) 09/11/2017   HCT 31.1 (L) 09/11/2017   MCV 83.1 09/11/2017   PLT 97 (L) 09/11/2017    Lab Results  Component Value Date   CREATININE 2.22 (H) 09/12/2017    Lab Results  Component Value Date   HGBA1C 6.9 (H) 04/01/2017      Assessment & Plan:   84 year old male with recurrent urothelial carcinoma and recurrent UTIs.  He is complaining of significant weakness since his last hospitalization.  He will need TURBT versus fulguration of his recurrent bladder tumors however will  need to be maximized medically prior to this procedure.  His son states they have a follow-up with Dr. Caryl Comes this week.  He completes his antibiotic next week and will recheck a urine culture after completing the antibiotic.    Abbie Sons, [place] [address] [place], [place] [address] [phone]

## 2017-09-29 ENCOUNTER — Other Ambulatory Visit: Payer: Self-pay

## 2017-09-29 ENCOUNTER — Other Ambulatory Visit: Payer: Medicare Other

## 2017-09-29 DIAGNOSIS — N390 Urinary tract infection, site not specified: Secondary | ICD-10-CM

## 2017-10-02 LAB — CULTURE, URINE COMPREHENSIVE

## 2017-10-03 ENCOUNTER — Telehealth: Payer: Self-pay

## 2017-10-03 NOTE — Telephone Encounter (Signed)
-----   Message from Abbie Sons, MD sent at 10/03/2017  7:25 AM EDT ----- Urine culture was positive for 2 different bacteria.  This may represent bacterial colonization.  Is he having any symptoms?

## 2017-10-03 NOTE — Telephone Encounter (Signed)
Spoke with pt daughter in reference to pt having UTI s/s. Daughter stated that pt is not currently having s/s but is still taking keflex.

## 2017-11-29 ENCOUNTER — Ambulatory Visit (INDEPENDENT_AMBULATORY_CARE_PROVIDER_SITE_OTHER): Payer: Medicare Other | Admitting: Vascular Surgery

## 2017-11-29 ENCOUNTER — Other Ambulatory Visit (INDEPENDENT_AMBULATORY_CARE_PROVIDER_SITE_OTHER): Payer: Medicare Other

## 2017-12-06 ENCOUNTER — Encounter

## 2017-12-06 ENCOUNTER — Encounter: Payer: Self-pay | Admitting: Urology

## 2017-12-06 ENCOUNTER — Ambulatory Visit (INDEPENDENT_AMBULATORY_CARE_PROVIDER_SITE_OTHER): Payer: Medicare Other | Admitting: Urology

## 2017-12-06 DIAGNOSIS — C679 Malignant neoplasm of bladder, unspecified: Secondary | ICD-10-CM

## 2017-12-06 LAB — URINALYSIS, COMPLETE
Bilirubin, UA: NEGATIVE
GLUCOSE, UA: NEGATIVE
Ketones, UA: NEGATIVE
NITRITE UA: NEGATIVE
Protein, UA: NEGATIVE
Specific Gravity, UA: 1.02 (ref 1.005–1.030)
Urobilinogen, Ur: 0.2 mg/dL (ref 0.2–1.0)
pH, UA: 7 (ref 5.0–7.5)

## 2017-12-06 LAB — MICROSCOPIC EXAMINATION

## 2017-12-06 NOTE — Progress Notes (Signed)
12/06/2017 12:32 PM   [name] Sep 18, 1922 [id number]  Referring provider: Adin Hector, MD [facility] [place], [place] [address]  Chief Complaint  Patient presents with  . Follow-up    HPI: 84 year old male presents for follow-up.  He has urothelial carcinoma with recurrent tumor found by Dr. Jacqlyn Larsen in November 2018.  He had had recurrent UTIs and had not been cleared for surgery.  I last saw him in March 2019.  He has been on low-dose antibiotic suppression without symptomatic UTIs.  He presents today with his son who states he has been cleared for surgery as long as he does not have an active infection.  His only complaint is urinary incontinence which has been chronic.   PMH: Past Medical History:  Diagnosis Date  . AAA (abdominal aortic aneurysm) ([place])   . Arrhythmia   . Diabetes mellitus without complication ([place])   . GI bleed   . Heart murmur   . Hyperlipidemia   . Hypertension   . Prostate cancer [facility])     Surgical History: Past Surgical History:  Procedure Laterality Date  . FLEXIBLE SIGMOIDOSCOPY N/A 10/21/2016   Procedure: FLEXIBLE SIGMOIDOSCOPY;  Surgeon: [name], MD;  Location: [facility] ENDOSCOPY;  Service: Endoscopy;  Laterality: N/A;  . JOINT REPLACEMENT    . PACEMAKER INSERTION Left 04/14/2015   Procedure: INSERTION PACEMAKER;  Surgeon: Isaias Cowman, MD;  Location: [facility] ORS;  Service: Cardiovascular;  Laterality: Left;  . PROSTATE SURGERY    . VISCERAL ARTERY INTERVENTION N/A 10/22/2016   Procedure: Visceral Artery Intervention;  Surgeon: Algernon Huxley, MD;  Location: [facility] CV LAB;  Service: Cardiovascular;  Laterality: N/A;    Home Medications:  Allergies as of 12/06/2017      Reactions   Diovan [valsartan] Cough   Gabapentin Other (See Comments)   Sedation at all doses   Hydralazine Itching   Lisinopril Cough   Pregabalin Other (See Comments)   Sedation at all doses   Levofloxacin Other (See  Comments)   Too many side effects. Nausea, vomiting, upset stomach, increased confusion, etc Other reaction(s): Confusion Nausea, chills Nausea, chills   Sulfamethoxazole-trimethoprim Other (See Comments)   Too many side effects. Nausea, vomiting, upset stomach, increased confusion, etc      Medication List        Accurate as of 12/06/17 12:32 PM. Always use your most recent med list.          azelastine 0.1 % nasal spray Commonly known as:  ASTELIN Place 1 spray into both nostrils 2 (two) times daily. Use in each nostril as directed   capsaicin 0.025 % cream Commonly known as:  ZOSTRIX Apply topically 2 (two) times daily.   doxycycline 100 MG capsule Commonly known as:  VIBRAMYCIN Take 100 mg by mouth 2 (two) times daily.   DULoxetine 20 MG capsule Commonly known as:  CYMBALTA Take 20 mg by mouth daily.   finasteride 5 MG tablet Commonly known as:  PROSCAR Take 5 mg by mouth daily.   glimepiride 2 MG tablet Commonly known as:  AMARYL 2 tablets in the morning and one tablet at night   guaiFENesin-dextromethorphan 100-10 MG/5ML syrup Commonly known as:  ROBITUSSIN DM Take 5 mLs by mouth every 4 (four) hours as needed for cough.   Metoprolol Tartrate 37.5 MG Tabs Take 37.5 mg by mouth 2 (two) times daily.   pantoprazole 40 MG tablet Commonly known as:  PROTONIX Take 40 mg by  mouth daily.   PRESERVISION/LUTEIN PO Take 1 capsule by mouth 2 (two) times daily.   multivitamin-iron-minerals-folic acid chewable tablet Chew 2 tablets by mouth daily.   ranitidine 150 MG tablet Commonly known as:  ZANTAC Take by mouth.   rosuvastatin 5 MG tablet Commonly known as:  CRESTOR Take 5 mg by mouth at bedtime.   sitaGLIPtin 50 MG tablet Commonly known as:  JANUVIA Take 50 mg by mouth daily.   sodium bicarbonate 650 MG tablet Take 1 tablet (650 mg total) by mouth 2 (two) times daily.   tamsulosin 0.4 MG Caps capsule Commonly known as:  FLOMAX Take 0.4 mg by mouth  2 (two) times daily.   triamcinolone cream 0.5 % Commonly known as:  KENALOG Apply 1 application topically 2 (two) times daily.   VITRON-C PO Take 1 tablet by mouth daily.       Allergies:  Allergies  Allergen Reactions  . Diovan [Valsartan] Cough  . Gabapentin Other (See Comments)    Sedation at all doses  . Hydralazine Itching  . Lisinopril Cough  . Pregabalin Other (See Comments)    Sedation at all doses  . Levofloxacin Other (See Comments)    Too many side effects. Nausea, vomiting, upset stomach, increased confusion, etc Other reaction(s): Confusion Nausea, chills Nausea, chills   . Sulfamethoxazole-Trimethoprim Other (See Comments)    Too many side effects. Nausea, vomiting, upset stomach, increased confusion, etc    Family History: Family History  Problem Relation Age of Onset  . Hypertension Other   . Stroke Other   . Heart attack Other   . Stroke Sister   . Heart attack Brother     Social History:  reports that he has quit smoking. He has never used smokeless tobacco. He reports that he does not drink alcohol or use drugs.  ROS: UROLOGY Frequent Urination?: No Hard to postpone urination?: No Burning/pain with urination?: No Get up at night to urinate?: No Leakage of urine?: No Urine stream starts and stops?: Yes Trouble starting stream?: Yes Do you have to strain to urinate?: No Blood in urine?: No Urinary tract infection?: No Sexually transmitted disease?: No Injury to kidneys or bladder?: No Painful intercourse?: No Weak stream?: No Erection problems?: No Penile pain?: No  Gastrointestinal Nausea?: No Vomiting?: No Indigestion/heartburn?: No Diarrhea?: No Constipation?: No  Constitutional Fever: No Night sweats?: No Weight loss?: No Fatigue?: No  Skin Skin rash/lesions?: No Itching?: No  Eyes Blurred vision?: No Double vision?: No  Ears/Nose/Throat Sore throat?: No Sinus problems?: No  Hematologic/Lymphatic Swollen  glands?: No Easy bruising?: No  Cardiovascular Leg swelling?: No Chest pain?: No  Respiratory Cough?: No Shortness of breath?: No  Endocrine Excessive thirst?: No  Musculoskeletal Back pain?: No Joint pain?: No  Neurological Headaches?: No Dizziness?: No  Psychologic Depression?: No Anxiety?: No  Physical Exam: There were no vitals taken for this visit.  Constitutional:  Alert and oriented, No acute distress. HEENT: [place] AT, moist mucus membranes.  Trachea midline, no masses. Cardiovascular: No clubbing, cyanosis, or edema. Respiratory: Normal respiratory effort, no increased work of breathing. GI: Abdomen is soft, nontender, nondistended, no abdominal masses GU: No CVA tenderness Lymph: No cervical or inguinal lymphadenopathy. Skin: No rashes, bruises or suspicious lesions. Neurologic: Grossly intact, no focal deficits, moving all 4 extremities. Psychiatric: Normal mood and affect.  Laboratory Data:   Urinalysis Dipstick 1+ blood 1+ leukocytes; microscopy 11-30 WBC/11-30 RBC   Assessment & Plan:   84 year old male with recurrent UTI and  urothelial carcinoma.  His urine was sent for culture today.  If clear we will attentively schedule cystoscopy and TURBT.    Abbie Sons, Cec[place] [address], [place], [place] [address] [phone]

## 2017-12-08 LAB — CULTURE, URINE COMPREHENSIVE

## 2017-12-09 ENCOUNTER — Encounter (INDEPENDENT_AMBULATORY_CARE_PROVIDER_SITE_OTHER): Payer: Self-pay | Admitting: Vascular Surgery

## 2017-12-09 ENCOUNTER — Ambulatory Visit (INDEPENDENT_AMBULATORY_CARE_PROVIDER_SITE_OTHER): Payer: Medicare Other | Admitting: Vascular Surgery

## 2017-12-09 ENCOUNTER — Telehealth: Payer: Self-pay | Admitting: Urology

## 2017-12-09 ENCOUNTER — Ambulatory Visit (INDEPENDENT_AMBULATORY_CARE_PROVIDER_SITE_OTHER): Payer: Medicare Other

## 2017-12-09 ENCOUNTER — Telehealth: Payer: Self-pay

## 2017-12-09 VITALS — BP 147/75 | HR 62 | Resp 16 | Ht 74.0 in | Wt 220.0 lb

## 2017-12-09 DIAGNOSIS — I714 Abdominal aortic aneurysm, without rupture, unspecified: Secondary | ICD-10-CM

## 2017-12-09 DIAGNOSIS — E785 Hyperlipidemia, unspecified: Secondary | ICD-10-CM | POA: Diagnosis not present

## 2017-12-09 DIAGNOSIS — I10 Essential (primary) hypertension: Secondary | ICD-10-CM | POA: Diagnosis not present

## 2017-12-09 DIAGNOSIS — E118 Type 2 diabetes mellitus with unspecified complications: Secondary | ICD-10-CM | POA: Diagnosis not present

## 2017-12-09 NOTE — Patient Instructions (Signed)
Abdominal Aortic Aneurysm Blood pumps away from the heart through tubes (blood vessels) called arteries. Aneurysms are weak or damaged places in the wall of an artery. It bulges out like a balloon. An abdominal aortic aneurysm happens in the main artery of the body (aorta). It can burst or tear, causing bleeding inside the body. This is an emergency. It needs treatment right away. What are the causes? The exact cause is unknown. Things that could cause this problem include:  Fat and other substances building up in the lining of a tube.  Swelling of the walls of a blood vessel.  Certain tissue diseases.  Belly (abdominal) trauma.  An infection in the main artery of the body.  What increases the risk? There are things that make it more likely for you to have an aneurysm. These include:  Being over the age of 82 years old.  Having high blood pressure (hypertension).  Being a male.  Being white.  Being very overweight (obese).  Having a family history of aneurysm.  Using tobacco products.  What are the signs or symptoms? Symptoms depend on the size of the aneurysm and how fast it grows. There may not be symptoms. If symptoms occur, they can include:  Pain (belly, side, lower back, or groin).  Feeling full after eating a small amount of food.  Feeling sick to your stomach (nauseous), throwing up (vomiting), or both.  Feeling a lump in your belly that feels like it is beating (pulsating).  Feeling like you will pass out (faint).  How is this treated?  Medicine to control blood pressure and pain.  Imaging tests to see if the aneurysm gets bigger.  Surgery. How is this prevented? To lessen your chance of getting this condition:  Stop smoking. Stop chewing tobacco.  Limit or avoid alcohol.  Keep your blood pressure, blood sugar, and cholesterol within normal limits.  Eat less salt.  Eat foods low in saturated fats and cholesterol. These are found in animal and  whole dairy products.  Eat more fiber. Fiber is found in whole grains, vegetables, and fruits.  Keep a healthy weight.  Stay active and exercise often.  This information is not intended to replace advice given to you by your health care provider. Make sure you discuss any questions you have with your health care provider. Document Released: 10/23/2012 Document Revised: 12/04/2015 Document Reviewed: 07/28/2012 Elsevier Interactive Patient Education  2017 [employer]  

## 2017-12-09 NOTE — Assessment & Plan Note (Signed)
blood pressure control important in reducing the progression of atherosclerotic disease and AAA growth. On appropriate oral medications.  

## 2017-12-09 NOTE — Assessment & Plan Note (Signed)
blood glucose control important in reducing the progression of atherosclerotic disease. Also, involved in wound healing. On appropriate medications.  

## 2017-12-09 NOTE — Progress Notes (Signed)
MRN : [id number]  Blake [name] is a 84 y.o. ([birth year]) male who presents with chief complaint of  Chief Complaint  Patient presents with  . AAA    61monthfollow up  .  History of Present Illness: Patient returns today in follow up of his abdominal aortic aneurysm.  He is doing reasonably well without any new issues or problems since his last visit.  No clear aneurysm related symptoms. Specifically, the patient denies new back or abdominal pain, or signs of peripheral embolization Aortic duplex shows a stable 5.1 cm infrarenal abdominal aortic aneurysm with no change from his previous study 6 months ago.  Current Outpatient Medications  Medication Sig Dispense Refill  . azelastine (ASTELIN) 0.1 % nasal spray Place 1 spray into both nostrils 2 (two) times daily. Use in each nostril as directed    . capsaicin (ZOSTRIX) 0.025 % cream Apply topically 2 (two) times daily. 60 g 0  . doxycycline (VIBRAMYCIN) 100 MG capsule Take 100 mg by mouth 2 (two) times daily.    . DULoxetine (CYMBALTA) 20 MG capsule Take 20 mg by mouth daily.    . finasteride (PROSCAR) 5 MG tablet Take 5 mg by mouth daily.    .Marland Kitchenglimepiride (AMARYL) 2 MG tablet 2 tablets in the morning and one tablet at night (Patient taking differently: Take 4 mg by mouth 2 (two) times daily. ) 90 tablet 11  . guaiFENesin-dextromethorphan (ROBITUSSIN DM) 100-10 MG/5ML syrup Take 5 mLs by mouth every 4 (four) hours as needed for cough. 118 mL 0  . Iron-Vitamin C (VITRON-C PO) Take 1 tablet by mouth daily.    . metoprolol tartrate 37.5 MG TABS Take 37.5 mg by mouth 2 (two) times daily. 60 tablet 2  . Multiple Vitamins-Minerals (PRESERVISION/LUTEIN PO) Take 1 capsule by mouth 2 (two) times daily.    . multivitamin-iron-minerals-folic acid (CENTRUM) chewable tablet Chew 2 tablets by mouth daily.    . pantoprazole (PROTONIX) 40 MG tablet Take 40 mg by mouth daily.    . ranitidine (ZANTAC) 150 MG tablet Take by mouth.    . rosuvastatin  (CRESTOR) 5 MG tablet Take 5 mg by mouth at bedtime.    . sitaGLIPtin (JANUVIA) 50 MG tablet Take 50 mg by mouth daily.    . sodium bicarbonate 650 MG tablet Take 1 tablet (650 mg total) by mouth 2 (two) times daily. 60 tablet 0  . tamsulosin (FLOMAX) 0.4 MG CAPS capsule Take 0.4 mg by mouth 2 (two) times daily.     .Marland Kitchentriamcinolone cream (KENALOG) 0.5 % Apply 1 application topically 2 (two) times daily.     No current facility-administered medications for this visit.     Past Medical History:  Diagnosis Date  . AAA (abdominal aortic aneurysm) ([facility]   . Arrhythmia   . Diabetes mellitus without complication ([facility]   . GI bleed   . Heart murmur   . Hyperlipidemia   . Hypertension   . Prostate cancer ([facility]     Past Surgical History:  Procedure Laterality Date  . FLEXIBLE SIGMOIDOSCOPY N/A 10/21/2016   Procedure: FLEXIBLE SIGMOIDOSCOPY;  Surgeon: DLucilla Lame MD;  Location: [facility] ENDOSCOPY;  Service: Endoscopy;  Laterality: N/A;  . JOINT REPLACEMENT    . PACEMAKER INSERTION Left 04/14/2015   Procedure: INSERTION PACEMAKER;  Surgeon: AIsaias Cowman MD;  Location: [facility] ORS;  Service: Cardiovascular;  Laterality: Left;  . PROSTATE SURGERY    . VISCERAL ARTERY INTERVENTION N/A 10/22/2016   Procedure: Visceral Artery  Intervention;  Surgeon: Algernon Huxley, MD;  Location: [place] [facility];  Service: Cardiovascular;  Laterality: N/A;    Social History  Substance Use Topics  . Smoking status: Former Research scientist (life sciences)  . Smokeless tobacco: Never Used  . Alcohol use No  Married  Family History      Family History  Problem Relation Age of Onset  . Hypertension Other   . Stroke Other   . Heart attack Other   . Stroke Sister   . Heart attack Brother   No bleeding or clotting disorders      Allergies  Allergen Reactions  . Diovan [Valsartan] Cough  . Hydralazine Itching  . Lisinopril Cough     REVIEW OF SYSTEMS (Negative unless checked)  Constitutional: '[]'$ Weight loss   '[]'$ Fever  '[]'$ Chills Cardiac: '[]'$ Chest pain   '[]'$ Chest pressure   '[]'$ Palpitations   '[]'$ Shortness of breath when laying flat   '[]'$ Shortness of breath at rest   '[]'$ Shortness of breath with exertion. Vascular:  '[]'$ Pain in legs with walking   '[]'$ Pain in legs at rest   '[]'$ Pain in legs when laying flat   '[]'$ Claudication   '[]'$ Pain in feet when walking  '[]'$ Pain in feet at rest  '[]'$ Pain in feet when laying flat   '[]'$ History of DVT   '[]'$ Phlebitis   '[x]'$ Swelling in legs   '[]'$ Varicose veins   '[]'$ Non-healing ulcers Pulmonary:   '[]'$ Uses home oxygen   '[]'$ Productive cough   '[]'$ Hemoptysis   '[]'$ Wheeze  '[]'$ COPD   '[]'$ Asthma Neurologic:  '[]'$ Dizziness  '[]'$ Blackouts   '[]'$ Seizures   '[]'$ History of stroke   '[]'$ History of TIA  '[]'$ Aphasia   '[]'$ Temporary blindness   '[]'$ Dysphagia   '[]'$ Weakness or numbness in arms   '[]'$ Weakness or numbness in legs Musculoskeletal:  '[x]'$ Arthritis   '[]'$ Joint swelling   '[]'$ Joint pain   '[x]'$ Low back pain Hematologic:  '[]'$ Easy bruising  '[]'$ Easy bleeding   '[]'$ Hypercoagulable state   '[]'$ Anemic   Gastrointestinal:  '[x]'$ Blood in stool   '[]'$ Vomiting blood  '[]'$ Gastroesophageal reflux/heartburn   '[]'$ Abdominal pain Genitourinary:  '[]'$ Chronic kidney disease   '[]'$ Difficult urination  '[]'$ Frequent urination  '[]'$ Burning with urination   '[]'$ Hematuria Skin:  '[]'$ Rashes   '[]'$ Ulcers   '[]'$ Wounds Psychological:  '[]'$ History of anxiety   '[]'$  History of major depression.       Physical Examination  BP (!) 147/75 (BP Location: Left Arm)   Pulse 62   Resp 16   Ht '6\' 2"'$  (1.88 m)   Wt 220 lb (99.8 kg)   BMI 28.25 kg/m  Gen:  WD/WN, NAD Head: [place]/AT, No temporalis wasting. Ear/Nose/Throat: Hearing grossly intact, nares w/o erythema or drainage Eyes: Conjunctiva clear. Sclera non-icteric Neck: Supple.  Trachea midline Pulmonary:  Good air movement, no use of accessory muscles.  Cardiac: RRR, no JVD Vascular:  Vessel Right Left  Radial Palpable Palpable                                   Gastrointestinal: soft, non-tender/non-distended. Increased aortic  impulse Musculoskeletal: M/S 5/5 throughout. Uses a wheelchair Neurologic: Sensation grossly intact in extremities.  Symmetrical.  Speech is fluent.  Psychiatric: Judgment intact, Mood & affect appropriate for pt's clinical situation. Dermatologic: No rashes or ulcers noted.  No cellulitis or open wounds.       Labs Recent Results (from the past 2160 hour(s))  Basic metabolic panel     Status: Abnormal   Collection Time: 09/10/17 10:51 AM  Result Value Ref  Range   Sodium 131 (L) 135 - 145 mmol/L   Potassium 4.4 3.5 - 5.1 mmol/L   Chloride 99 (L) 101 - 111 mmol/L   CO2 17 (L) 22 - 32 mmol/L   Glucose, Bld 350 (H) 65 - 99 mg/dL   BUN 48 (H) 6 - 20 mg/dL   Creatinine, Ser 2.66 (H) 0.61 - 1.24 mg/dL   Calcium 8.6 (L) 8.9 - 10.3 mg/dL   GFR calc non Af Amer 19 (L) >60 mL/min   GFR calc Af Amer 22 (L) >60 mL/min    Comment: (NOTE) The eGFR has been calculated using the CKD EPI equation. This calculation has not been validated in all clinical situations. eGFR's persistently <60 mL/min signify possible Chronic Kidney Disease.    Anion gap 15 5 - 15    Comment: Performed at [facility], [address] [place], [place] [address]  CBC     Status: Abnormal   Collection Time: 09/10/17 10:51 AM  Result Value Ref Range   WBC 14.4 (H) 3.8 - 10.6 K/uL   RBC 4.24 (L) 4.40 - 5.90 MIL/uL   Hemoglobin 11.5 (L) 13.0 - 18.0 g/dL   HCT 35.8 (L) 40.0 - 52.0 %   MCV 84.4 80.0 - 100.0 fL   MCH 27.2 26.0 - 34.0 pg   MCHC 32.2 32.0 - 36.0 g/dL   RDW 16.7 (H) 11.5 - 14.5 %   Platelets 126 (L) 150 - 440 K/uL    Comment: Performed at [facility], [address] [place], [place] [address]  Urinalysis, Complete w Microscopic     Status: Abnormal   Collection Time: 09/10/17 10:51 AM  Result Value Ref Range   Color, Urine YELLOW (A) YELLOW   APPearance TURBID (A) CLEAR   Specific Gravity, Urine 1.014 1.005 - 1.030   pH 5.0 5.0 - 8.0   Glucose, UA 50 (A) NEGATIVE mg/dL   Hgb  urine dipstick MODERATE (A) NEGATIVE   Bilirubin Urine NEGATIVE NEGATIVE   Ketones, ur NEGATIVE NEGATIVE mg/dL   Protein, ur 100 (A) NEGATIVE mg/dL   Nitrite NEGATIVE NEGATIVE   Leukocytes, UA LARGE (A) NEGATIVE   RBC / HPF TOO NUMEROUS TO COUNT 0 - 5 RBC/hpf   WBC, UA TOO NUMEROUS TO COUNT 0 - 5 WBC/hpf   Bacteria, UA MANY (A) NONE SEEN   Squamous Epithelial / LPF NONE SEEN NONE SEEN   WBC Clumps PRESENT     Comment: Performed at [facility], [address] [place], [place] [address]  Urine culture     Status: Abnormal   Collection Time: 09/10/17 10:51 AM  Result Value Ref Range   Specimen Description      URINE, RANDOM Performed at [facility], [address] [place], [place] [address]    Special Requests      NONE Performed at [facility], [address] [place],  [address]    Culture >=100,000 COLONIES/mL KLEBSIELLA PNEUMONIAE (A)    Report Status 09/12/2017 FINAL    Organism ID, Bacteria KLEBSIELLA PNEUMONIAE (A)       Susceptibility   Klebsiella pneumoniae - MIC*    AMPICILLIN >=32 RESISTANT Resistant     CEFAZOLIN <=4 SENSITIVE Sensitive     CEFTRIAXONE <=1 SENSITIVE Sensitive     CIPROFLOXACIN <=0.25 SENSITIVE Sensitive     GENTAMICIN <=1 SENSITIVE Sensitive     IMIPENEM <=0.25 SENSITIVE Sensitive     NITROFURANTOIN 64 INTERMEDIATE Intermediate     TRIMETH/SULFA <=  20 SENSITIVE Sensitive     AMPICILLIN/SULBACTAM 16 INTERMEDIATE Intermediate     PIP/TAZO 16 SENSITIVE Sensitive     Extended ESBL NEGATIVE Sensitive     * >=100,000 COLONIES/mL KLEBSIELLA PNEUMONIAE  Culture, blood (routine x 2)     Status: Abnormal   Collection Time: 09/10/17 12:42 PM  Result Value Ref Range   Specimen Description      BLOOD RIGHT ASSIST CONTROL Performed at [facility], [address] [place], [place] [address]    Special Requests      BOTTLES DRAWN AEROBIC AND ANAEROBIC Blood Culture results may not be optimal due to an  excessive volume of blood received in culture bottles Performed at [facility], [address] [place], [place] [address]    Culture  Setup Time      GRAM NEGATIVE RODS AEROBIC BOTTLE ONLY CRITICAL RESULT CALLED TO, READ BACK BY AND VERIFIED WITH: [place] AT 2308 09/11/17.PMH    Culture KLEBSIELLA PNEUMONIAE (A)    Report Status 09/14/2017 FINAL    Organism ID, Bacteria KLEBSIELLA PNEUMONIAE       Susceptibility   Klebsiella pneumoniae - MIC*    AMPICILLIN >=32 RESISTANT Resistant     CEFAZOLIN <=4 SENSITIVE Sensitive     CEFEPIME <=1 SENSITIVE Sensitive     CEFTAZIDIME <=1 SENSITIVE Sensitive     CEFTRIAXONE <=1 SENSITIVE Sensitive     CIPROFLOXACIN <=0.25 SENSITIVE Sensitive     GENTAMICIN <=1 SENSITIVE Sensitive     IMIPENEM <=0.25 SENSITIVE Sensitive     TRIMETH/SULFA <=20 SENSITIVE Sensitive     AMPICILLIN/SULBACTAM 16 INTERMEDIATE Intermediate     PIP/TAZO 16 SENSITIVE Sensitive     Extended ESBL NEGATIVE Sensitive     * KLEBSIELLA PNEUMONIAE  Culture, blood (routine x 2)     Status: Abnormal   Collection Time: 09/10/17 12:42 PM  Result Value Ref Range   Specimen Description      BLOOD LEFT ASSIST CONTROL Performed at [facility], [address] [place], [place] [address]    Special Requests      BOTTLES DRAWN AEROBIC AND ANAEROBIC Blood Culture results may not be optimal due to an excessive volume of blood received in culture bottles Performed at [facility], [address] [place], [place] [address]    Culture  Setup Time      GRAM NEGATIVE RODS AEROBIC BOTTLE ONLY CRITICAL VALUE NOTED.  VALUE IS CONSISTENT WITH PREVIOUSLY REPORTED AND CALLED VALUE. Performed at [facility], [address], [place] [address]    Culture (A)     KLEBSIELLA PNEUMONIAE SUSCEPTIBILITIES PERFORMED ON PREVIOUS CULTURE WITHIN THE LAST 5 DAYS. Performed at [facility], [address], [place], [place] [address]    Report  Status 09/16/2017 FINAL   Lactic acid, plasma     Status: Abnormal   Collection Time: 09/10/17 12:42 PM  Result Value Ref Range   Lactic Acid, Venous 2.9 (HH) 0.5 - 1.9 mmol/L    Comment: CRITICAL RESULT CALLED TO, READ BACK BY AND VERIFIED WITH ANGELA ROBBINS ON 09/10/17 AT 1333 QSD Performed at [facility], [address] [place], [place] [address]   Blood Culture ID Panel (Reflexed)     Status: Abnormal   Collection Time: 09/10/17 12:42 PM  Result Value Ref Range   Enterococcus species NOT DETECTED NOT DETECTED   Listeria monocytogenes NOT DETECTED NOT DETECTED   Staphylococcus species NOT DETECTED NOT DETECTED   Staphylococcus aureus  NOT DETECTED NOT DETECTED   Streptococcus species NOT DETECTED NOT DETECTED   Streptococcus agalactiae NOT DETECTED NOT DETECTED   Streptococcus pneumoniae NOT DETECTED NOT DETECTED   Streptococcus pyogenes NOT DETECTED NOT DETECTED   Acinetobacter baumannii NOT DETECTED NOT DETECTED   Enterobacteriaceae species DETECTED (A) NOT DETECTED    Comment: Enterobacteriaceae represent a large family of gram-negative bacteria, not a single organism. CRITICAL RESULT CALLED TO, READ BACK BY AND VERIFIED WITH: DAVID BESANTI AT 2308 09/11/17.PMH    Enterobacter cloacae complex NOT DETECTED NOT DETECTED   Escherichia coli NOT DETECTED NOT DETECTED   Klebsiella oxytoca NOT DETECTED NOT DETECTED   Klebsiella pneumoniae DETECTED (A) NOT DETECTED    Comment: CRITICAL RESULT CALLED TO, READ BACK BY AND VERIFIED WITH: DAVID BESANTI AT 2308 09/11/17.PMH    Proteus species NOT DETECTED NOT DETECTED   Serratia marcescens NOT DETECTED NOT DETECTED   Carbapenem resistance NOT DETECTED NOT DETECTED   Haemophilus influenzae NOT DETECTED NOT DETECTED   Neisseria meningitidis NOT DETECTED NOT DETECTED   Pseudomonas aeruginosa NOT DETECTED NOT DETECTED   Candida albicans NOT DETECTED NOT DETECTED   Candida glabrata NOT DETECTED NOT DETECTED   Candida krusei NOT  DETECTED NOT DETECTED   Candida parapsilosis NOT DETECTED NOT DETECTED   Candida tropicalis NOT DETECTED NOT DETECTED    Comment: Performed at [facility], [address] [place], [place] [address]  Lactic acid, plasma     Status: Abnormal   Collection Time: 09/10/17  2:58 PM  Result Value Ref Range   Lactic Acid, Venous 2.7 (HH) 0.5 - 1.9 mmol/L    Comment: CRITICAL RESULT CALLED TO, READ BACK BY AND VERIFIED WITH LEIGH MILES 09/10/17 @ 1542  [place] Performed at [facility], [address] [place], [place] [address]   Glucose, capillary     Status: Abnormal   Collection Time: 09/10/17  5:07 PM  Result Value Ref Range   Glucose-Capillary 278 (H) 65 - 99 mg/dL   Comment 1 Notify RN   Glucose, capillary     Status: Abnormal   Collection Time: 09/10/17  9:17 PM  Result Value Ref Range   Glucose-Capillary 267 (H) 65 - 99 mg/dL  Comprehensive metabolic panel     Status: Abnormal   Collection Time: 09/11/17  6:56 AM  Result Value Ref Range   Sodium 132 (L) 135 - 145 mmol/L   Potassium 4.1 3.5 - 5.1 mmol/L   Chloride 104 101 - 111 mmol/L   CO2 19 (L) 22 - 32 mmol/L   Glucose, Bld 267 (H) 65 - 99 mg/dL   BUN 43 (H) 6 - 20 mg/dL   Creatinine, Ser 2.48 (H) 0.61 - 1.24 mg/dL   Calcium 8.0 (L) 8.9 - 10.3 mg/dL   Total Protein 6.5 6.5 - 8.1 g/dL   Albumin 2.5 (L) 3.5 - 5.0 g/dL   AST 16 15 - 41 U/L   ALT 12 (L) 17 - 63 U/L   Alkaline Phosphatase 75 38 - 126 U/L   Total Bilirubin 0.8 0.3 - 1.2 mg/dL   GFR calc non Af Amer 21 (L) >60 mL/min   GFR calc Af Amer 24 (L) >60 mL/min    Comment: (NOTE) The eGFR has been calculated using the CKD EPI equation. This calculation has not been validated in all clinical situations. eGFR's persistently <60 mL/min signify possible Chronic Kidney Disease.    Anion gap 9 5 - 15    Comment: Performed at [facility],  [place], [place] [address]  CBC     Status: Abnormal   Collection Time: 09/11/17  6:56 AM   Result Value Ref Range   WBC 8.9 3.8 - 10.6 K/uL   RBC 3.74 (L) 4.40 - 5.90 MIL/uL   Hemoglobin 10.3 (L) 13.0 - 18.0 g/dL   HCT 31.1 (L) 40.0 - 52.0 %   MCV 83.1 80.0 - 100.0 fL   MCH 27.4 26.0 - 34.0 pg   MCHC 33.0 32.0 - 36.0 g/dL   RDW 16.6 (H) 11.5 - 14.5 %   Platelets 97 (L) 150 - 440 K/uL    Comment: Performed at [facility], [address] [place], [place] [address]  Glucose, capillary     Status: Abnormal   Collection Time: 09/11/17  7:54 AM  Result Value Ref Range   Glucose-Capillary 254 (H) 65 - 99 mg/dL   Comment 1 Notify RN   Glucose, capillary     Status: Abnormal   Collection Time: 09/11/17 11:41 AM  Result Value Ref Range   Glucose-Capillary 285 (H) 65 - 99 mg/dL   Comment 1 Notify RN   Glucose, capillary     Status: Abnormal   Collection Time: 09/11/17  5:14 PM  Result Value Ref Range   Glucose-Capillary 167 (H) 65 - 99 mg/dL   Comment 1 Notify RN   Glucose, capillary     Status: Abnormal   Collection Time: 09/11/17  9:04 PM  Result Value Ref Range   Glucose-Capillary 147 (H) 65 - 99 mg/dL  Basic metabolic panel     Status: Abnormal   Collection Time: 09/12/17  5:20 AM  Result Value Ref Range   Sodium 136 135 - 145 mmol/L   Potassium 3.9 3.5 - 5.1 mmol/L   Chloride 108 101 - 111 mmol/L   CO2 18 (L) 22 - 32 mmol/L   Glucose, Bld 168 (H) 65 - 99 mg/dL   BUN 41 (H) 6 - 20 mg/dL   Creatinine, Ser 2.22 (H) 0.61 - 1.24 mg/dL   Calcium 8.1 (L) 8.9 - 10.3 mg/dL   GFR calc non Af Amer 24 (L) >60 mL/min   GFR calc Af Amer 28 (L) >60 mL/min    Comment: (NOTE) The eGFR has been calculated using the CKD EPI equation. This calculation has not been validated in all clinical situations. eGFR's persistently <60 mL/min signify possible Chronic Kidney Disease.    Anion gap 10 5 - 15    Comment: Performed at [facility], [address] [place], [place] [address]  Glucose, capillary     Status: Abnormal   Collection Time: 09/12/17  8:00 AM   Result Value Ref Range   Glucose-Capillary 153 (H) 65 - 99 mg/dL   Comment 1 Notify RN   Glucose, capillary     Status: Abnormal   Collection Time: 09/12/17 11:51 AM  Result Value Ref Range   Glucose-Capillary 155 (H) 65 - 99 mg/dL   Comment 1 Notify RN   Glucose, capillary     Status: None   Collection Time: 09/12/17  4:39 PM  Result Value Ref Range   Glucose-Capillary 65 65 - 99 mg/dL   Comment 1 Notify RN   Glucose, capillary     Status: None   Collection Time: 09/12/17  5:14 PM  Result Value Ref Range   Glucose-Capillary 92 65 - 99 mg/dL   Comment 1 Notify RN   Glucose, capillary     Status: Abnormal  Collection Time: 09/12/17  9:33 PM  Result Value Ref Range   Glucose-Capillary 196 (H) 65 - 99 mg/dL  Glucose, capillary     Status: Abnormal   Collection Time: 09/13/17  7:51 AM  Result Value Ref Range   Glucose-Capillary 190 (H) 65 - 99 mg/dL   Comment 1 Notify RN   Glucose, capillary     Status: Abnormal   Collection Time: 09/13/17 12:03 PM  Result Value Ref Range   Glucose-Capillary 198 (H) 65 - 99 mg/dL   Comment 1 Notify RN   CULTURE, URINE COMPREHENSIVE     Status: Abnormal   Collection Time: 09/29/17 11:37 AM  Result Value Ref Range   Urine Culture, Comprehensive Final report (A)    Organism ID, Bacteria Comment (A)     Comment: Pseudomonas aeruginosa 50,000-100,000 colony forming units per mL    Organism ID, Bacteria Enterococcus faecium (A)     Comment: 50,000-100,000 colony forming units per mL Note: this isolate is vancomycin-susceptible. This information is provided for epidemiologic purposes only: vancomycin is not among the antibiotics recommended for therapy of urinary tract infections caused by Enterococcus.    ANTIMICROBIAL SUSCEPTIBILITY Comment     Comment:       ** S = Susceptible; I = Intermediate; R = Resistant **                    P = Positive; N = Negative             MICS are expressed in micrograms per mL    Antibiotic                  RSLT#1    RSLT#2    RSLT#3    RSLT#4 Amikacin                       S Cefepime                       S Ceftazidime                    S Ciprofloxacin                  S         R Gentamicin                     S Imipenem                       S Levofloxacin                   I         R Meropenem                      S Nitrofurantoin                           I Penicillin                               R Piperacillin                   S Tetracycline  R Ticarcillin                    S Tobramycin                     S Vancomycin                               S   Urinalysis, Complete     Status: Abnormal   Collection Time: 12/06/17 11:58 AM  Result Value Ref Range   Specific Gravity, UA 1.020 1.005 - 1.030   pH, UA 7.0 5.0 - 7.5   Color, UA Yellow Yellow   Appearance Ur Cloudy (A) Clear   Leukocytes, UA 1+ (A) Negative   Protein, UA Negative Negative/Trace   Glucose, UA Negative Negative   Ketones, UA Negative Negative   RBC, UA 1+ (A) Negative   Bilirubin, UA Negative Negative   Urobilinogen, Ur 0.2 0.2 - 1.0 mg/dL   Nitrite, UA Negative Negative   Microscopic Examination See below:   Microscopic Examination     Status: Abnormal   Collection Time: 12/06/17 11:58 AM  Result Value Ref Range   WBC, UA 11-30 (A) 0 - 5 /hpf   RBC, UA 11-30 (A) 0 - 2 /hpf   Epithelial Cells (non renal) 0-10 0 - 10 /hpf   Mucus, UA Present (A) Not Estab.   Bacteria, UA Many (A) None seen/Few  CULTURE, URINE COMPREHENSIVE     Status: None   Collection Time: 12/06/17 12:52 PM  Result Value Ref Range   Urine Culture, Comprehensive Final report    Organism ID, Bacteria Comment     Comment: Mixed urogenital flora Greater than 100,000 colony forming units per mL     Radiology No results found.  Assessment/Plan  Benign hypertension blood pressure control important in reducing the progression of atherosclerotic disease and AAA growth. On appropriate oral  medications.   Diabetes mellitus, type 2 (HCC) blood glucose control important in reducing the progression of atherosclerotic disease. Also, involved in wound healing. On appropriate medications.   HLD (hyperlipidemia) lipid control important in reducing the progression of atherosclerotic disease. Continue statin therapy   AAA (abdominal aortic aneurysm) without rupture (HCC) Aortic duplex shows a stable 5.1 cm infrarenal abdominal aortic aneurysm with no change from his previous study 6 months ago. Given his advanced age and stable status of his aneurysm, continued medical management and observation with six-month duplex will be planned.  We would defer repair until this reached at least 5.5 cm in maximal diameter.    Leotis Pain, MD  12/09/2017 10:50 AM    This note was created with Dragon medical transcription system.  Any errors from dictation are purely unintentional

## 2017-12-09 NOTE — Assessment & Plan Note (Signed)
Aortic duplex shows a stable 5.1 cm infrarenal abdominal aortic aneurysm with no change from his previous study 6 months ago. Given his advanced age and stable status of his aneurysm, continued medical management and observation with six-month duplex will be planned.  We would defer repair until this reached at least 5.5 cm in maximal diameter.

## 2017-12-09 NOTE — Telephone Encounter (Signed)
-----   Message from Abbie Sons, MD sent at 12/08/2017 12:11 PM EDT ----- Urine culture was negative for infection.  Will contact regarding scheduling of cystoscopy/TURBT

## 2017-12-09 NOTE — Telephone Encounter (Signed)
Called for urine results, number called was for Dr. Primus Bravo, she was unable to take call at the time, she will call back for results.

## 2017-12-09 NOTE — Assessment & Plan Note (Signed)
lipid control important in reducing the progression of atherosclerotic disease. Continue statin therapy  

## 2017-12-09 NOTE — Telephone Encounter (Signed)
Patient's daughter Lazarus Salines called back for results, she stated she was his POA and will bring the paperwork in today and sign a DPR.  Sharyn Lull

## 2017-12-09 NOTE — Telephone Encounter (Signed)
Patient's daughter Sohan Potvin wants to know if you can call her to discuss him having surgery? She states that she is his POA but as of right now there is not anything in his chart. She is going to come by the office to provide this documentation and sign a DPR so that we will have this on file. She said that he is not able to understand why he needs surgery. Her direct number is [phone]. She is going to come by today.  Sharyn Lull

## 2017-12-12 ENCOUNTER — Telehealth: Payer: Self-pay | Admitting: Urology

## 2017-12-12 NOTE — Telephone Encounter (Signed)
I spoke with Dr. Primus Bravo regarding her father.  She was concerned about the effects of anesthesia with his advanced age and was inquiring if an office cystoscopy could be done prior to scheduling.  His last cystoscopy was by Dr. Jacqlyn Larsen in November 2018 and recurrent tumor was seen at that visit.  I informed her I would be happy to schedule office cystoscopy prior to proceeding with an anesthetic procedure.   Please schedule office cystoscopy and use Dr. Primus Bravo as the contact person.

## 2017-12-12 NOTE — Telephone Encounter (Signed)
How urgent is this because you have nothing until July?   Sharyn Lull

## 2017-12-13 NOTE — Telephone Encounter (Signed)
I found him a spot   Sharyn Lull

## 2017-12-13 NOTE — Telephone Encounter (Signed)
Would be better to do sometime this month.  May be I can do it next Thursday if my lithotripsy schedule is not full.

## 2017-12-29 ENCOUNTER — Ambulatory Visit: Payer: Medicare Other | Admitting: Urology

## 2017-12-29 ENCOUNTER — Encounter: Payer: Self-pay | Admitting: Urology

## 2017-12-29 VITALS — BP 137/87 | HR 80 | Ht 74.0 in

## 2017-12-29 DIAGNOSIS — D494 Neoplasm of unspecified behavior of bladder: Secondary | ICD-10-CM

## 2017-12-29 DIAGNOSIS — N390 Urinary tract infection, site not specified: Secondary | ICD-10-CM

## 2017-12-29 LAB — MICROSCOPIC EXAMINATION
EPITHELIAL CELLS (NON RENAL): NONE SEEN /HPF (ref 0–10)
WBC, UA: >30 /hpf — ABNORMAL HIGH (ref 0–5)

## 2017-12-29 LAB — URINALYSIS, COMPLETE
BILIRUBIN UA: NEGATIVE
Glucose, UA: NEGATIVE
Ketones, UA: NEGATIVE
Nitrite, UA: NEGATIVE
PH UA: 5.5 (ref 5.0–7.5)
Specific Gravity, UA: 1.015 (ref 1.005–1.030)
Urobilinogen, Ur: 0.2 mg/dL (ref 0.2–1.0)

## 2017-12-29 NOTE — Progress Notes (Signed)
   12/29/17  CC:  Chief Complaint  Patient presents with  . Cysto    HPI: 84 year old male with a history of recurrent urothelial carcinoma.  Last cystoscopy by Dr. Jacqlyn Larsen showed recurrent tumor however TURBT was never performed due to recurrent UTIs.  His UTIs were controlled with low-dose suppression.  I spoke with his daughter who is his [facility] POA and she requested office cystoscopy to make sure the tumor was indeed present as she was concerned about his age and anesthesia.  Blood pressure 137/87, pulse 80, height 6\' 2"  (1.88 m). NED. A&Ox3.     Cystoscopy Procedure Note  Patient identification was confirmed, informed consent was obtained, and patient was prepped using Betadine solution.  Lidocaine jelly was administered per urethral meatus.    Preoperative abx where received prior to procedure.     Pre-Procedure: - Inspection reveals a normal caliber ureteral meatus.  Procedure: The flexible cystoscope was introduced without difficulty - No urethral strictures/lesions are present. - Nonocclusive prostate  - Normal bladder neck - Bilateral ureteral orifices identified - Bladder mucosa  reveals high-grade appearing bladder tumor in the upper left lateral wall/dome region. - No bladder stones - No trabeculation  Retroflexion shows no significant abnormalities   Post-Procedure: - Patient tolerated the procedure well  Assessment/ Plan: High-grade appearing bladder tumor as previously noted by Dr. Jacqlyn Larsen.  His daughter is his healthcare power of attorney and will contact her regarding these findings and plan.

## 2018-01-02 LAB — CULTURE, URINE COMPREHENSIVE

## 2018-01-11 DIAGNOSIS — G500 Trigeminal neuralgia: Secondary | ICD-10-CM | POA: Insufficient documentation

## 2018-01-11 DIAGNOSIS — M26629 Arthralgia of temporomandibular joint, unspecified side: Secondary | ICD-10-CM | POA: Insufficient documentation

## 2018-01-29 ENCOUNTER — Telehealth: Payer: Self-pay | Admitting: Urology

## 2018-01-29 DIAGNOSIS — C679 Malignant neoplasm of bladder, unspecified: Secondary | ICD-10-CM

## 2018-01-29 NOTE — Telephone Encounter (Signed)
I spoke with patient's daughter Blake [name] who is his [facility] POA.  Recent cystoscopy does show persistent tumor in the bladder which was noted by Dr. Jacqlyn Larsen in November 2019.  Based on his previous note there does not appear to be significant growth in the tumor.  Without treatment it was explained he is likely to develop problems including hematuria with clot retention and growth/possible extension/metastasis of his tumor.  She is very concerned about anesthesia and his age group and is contemplating observation at this point.  I at least recommended a CT of the pelvis to evaluate for any evidence of deeper involvement of the tumor.  My recommendation would be TURBT which could be done under regional anesthesia.  She would like to proceed with the CT before scheduling.    Order was entered for the CT.  Will call with results

## 2018-02-28 ENCOUNTER — Ambulatory Visit
Admission: RE | Admit: 2018-02-28 | Discharge: 2018-02-28 | Disposition: A | Discharge status: Home / Self Care | Payer: Medicare Other | Source: Ambulatory Visit | Attending: Urology | Admitting: Urology

## 2018-02-28 DIAGNOSIS — C679 Malignant neoplasm of bladder, unspecified: Secondary | ICD-10-CM | POA: Diagnosis not present

## 2018-02-28 DIAGNOSIS — N134 Hydroureter: Secondary | ICD-10-CM | POA: Insufficient documentation

## 2018-04-03 ENCOUNTER — Other Ambulatory Visit: Payer: Self-pay | Admitting: Urology

## 2018-04-03 ENCOUNTER — Telehealth: Payer: Self-pay | Admitting: Urology

## 2018-04-03 MED ORDER — CEPHALEXIN 500 MG PO CAPS: 500.0000 mg | ORAL_CAPSULE | Freq: Every day | ORAL | 1 refills | Status: DC

## 2018-04-03 NOTE — Telephone Encounter (Signed)
Mr. Hetz has recurrent papillary tumor that was originally seen on cystoscopy by Dr. Jacqlyn Larsen in November 2018.  Family wanted to hold off on TURBT due to anesthetic risks.  Cystoscopy by me in June showed papillary tumor present and based on Dr. Bjorn Loser note did not appear to be a significant increase in volume.  I recommended TURBT.  His daughter, Lazarus Salines, is his [facility] POA and the family elected to hold off on surgery at this time.  I did order a pelvic CT which showed no gross evidence of tumor extending into the muscle or outside of the bladder.  These findings were discussed with Dr. Primus Bravo.  Potential risks of delay were discussed including progression of disease or gross hematuria with clot retention.  She would initially like to schedule a follow-up surveillance cystoscopy and will tentatively schedule in December timeframe.

## 2018-04-04 ENCOUNTER — Telehealth: Payer: Self-pay | Admitting: Urology

## 2018-04-04 NOTE — Telephone Encounter (Signed)
App made and mailed to patient MB

## 2018-04-04 NOTE — Telephone Encounter (Signed)
-----   Message from Abbie Sons, MD sent at 04/03/2018  4:07 PM EDT ----- Please schedule follow-up cystoscopy December 2019.  Contact Dr. Lazarus Salines who is his [facility] POA

## 2018-05-22 ENCOUNTER — Telehealth: Payer: Self-pay

## 2018-05-22 MED ORDER — CEPHALEXIN 500 MG PO CAPS: 500.0000 mg | ORAL_CAPSULE | Freq: Every day | ORAL | 1 refills | Status: DC

## 2018-05-22 NOTE — Telephone Encounter (Signed)
It should be daily.  Refill was sent

## 2018-05-22 NOTE — Telephone Encounter (Signed)
Pharmacy called office and stated that pt daughter was requesting a refill on the Keflex 500mg . Per your order, pt was given this rx to take daily, qty 90. Pt daughter informed pharmacy she has been giving pt bid instead. Pharmacy now requesting an updated rx with directions of qd or bid. Please advise.

## 2018-05-24 NOTE — Telephone Encounter (Signed)
Pt daughter informed

## 2018-06-20 ENCOUNTER — Encounter (INDEPENDENT_AMBULATORY_CARE_PROVIDER_SITE_OTHER): Payer: Self-pay | Admitting: Vascular Surgery

## 2018-06-20 ENCOUNTER — Ambulatory Visit (INDEPENDENT_AMBULATORY_CARE_PROVIDER_SITE_OTHER): Payer: Medicare Other

## 2018-06-20 ENCOUNTER — Ambulatory Visit (INDEPENDENT_AMBULATORY_CARE_PROVIDER_SITE_OTHER): Payer: Medicare Other | Admitting: Vascular Surgery

## 2018-06-20 VITALS — BP 148/80 | HR 70 | Ht 74.0 in | Wt 215.4 lb

## 2018-06-20 DIAGNOSIS — I714 Abdominal aortic aneurysm, without rupture, unspecified: Secondary | ICD-10-CM

## 2018-06-20 DIAGNOSIS — E785 Hyperlipidemia, unspecified: Secondary | ICD-10-CM

## 2018-06-20 DIAGNOSIS — E119 Type 2 diabetes mellitus without complications: Secondary | ICD-10-CM

## 2018-06-20 DIAGNOSIS — I10 Essential (primary) hypertension: Secondary | ICD-10-CM

## 2018-06-20 NOTE — Progress Notes (Signed)
MRN : [id number]  Blake [name] is a 84 y.o. (1922/12/15) male who presents with chief complaint of  Chief Complaint  Patient presents with  . Follow-up    AAA U/S  .  History of Present Illness: Patient returns today in follow up of his abdominal aortic aneurysm.  His son reports that about a month ago he had one episode of abdominal pain but nothing really since.  He denies fever or chills.  No severe back pain or signs of peripheral embolization.  His duplex today shows no change in his abdominal aortic aneurysm measuring 5.1 cm in maximal diameter.  Current Outpatient Medications  Medication Sig Dispense Refill  . azelastine (ASTELIN) 0.1 % nasal spray Place 1 spray into both nostrils 2 (two) times daily. Use in each nostril as directed    . finasteride (PROSCAR) 5 MG tablet Take 5 mg by mouth daily.    Marland Kitchen glimepiride (AMARYL) 2 MG tablet 2 tablets in the morning and one tablet at night (Patient taking differently: Take 4 mg by mouth 2 (two) times daily. ) 90 tablet 11  . Iron-Vitamin C (VITRON-C PO) Take 1 tablet by mouth daily.    . metoprolol tartrate 37.5 MG TABS Take 37.5 mg by mouth 2 (two) times daily. 60 tablet 2  . Multiple Vitamins-Minerals (PRESERVISION/LUTEIN PO) Take 1 capsule by mouth 2 (two) times daily.    . multivitamin-iron-minerals-folic acid (CENTRUM) chewable tablet Chew 2 tablets by mouth daily.    . pantoprazole (PROTONIX) 40 MG tablet Take 40 mg by mouth daily.    . ranitidine (ZANTAC) 150 MG tablet Take by mouth.    . rosuvastatin (CRESTOR) 5 MG tablet Take 5 mg by mouth at bedtime.    . sitaGLIPtin (JANUVIA) 50 MG tablet Take 50 mg by mouth daily.    . sodium bicarbonate 650 MG tablet Take 1 tablet (650 mg total) by mouth 2 (two) times daily. 60 tablet 0  . tamsulosin (FLOMAX) 0.4 MG CAPS capsule Take 0.4 mg by mouth 2 (two) times daily.     Marland Kitchen triamcinolone cream (KENALOG) 0.5 % Apply 1 application topically 2 (two) times daily.    . cephALEXin (KEFLEX)  500 MG capsule Take 1 capsule (500 mg total) by mouth daily. 90 capsule 1  . doxycycline (VIBRAMYCIN) 100 MG capsule Take 100 mg by mouth 2 (two) times daily.     No current facility-administered medications for this visit.     Past Medical History:  Diagnosis Date  . AAA (abdominal aortic aneurysm) ([place])   . Arrhythmia   . Diabetes mellitus without complication ([place])   . GI bleed   . Heart murmur   . Hyperlipidemia   . Hypertension   . Prostate cancer [facility])     Past Surgical History:  Procedure Laterality Date  . FLEXIBLE SIGMOIDOSCOPY N/A 10/21/2016   Procedure: FLEXIBLE SIGMOIDOSCOPY;  Surgeon: [name], MD;  Location: [facility] ENDOSCOPY;  Service: Endoscopy;  Laterality: N/A;  . JOINT REPLACEMENT    . PACEMAKER INSERTION Left 04/14/2015   Procedure: INSERTION PACEMAKER;  Surgeon: Isaias Cowman, MD;  Location: [facility] ORS;  Service: Cardiovascular;  Laterality: Left;  . PROSTATE SURGERY    . VISCERAL ARTERY INTERVENTION N/A 10/22/2016   Procedure: Visceral Artery Intervention;  Surgeon: Algernon Huxley, MD;  Location: [place] CV LAB;  Service: Cardiovascular;  Laterality: N/A;    Social History Social History   Tobacco Use  . Smoking status: Former Research scientist (life sciences)  . Smokeless tobacco:  Never Used  Substance Use Topics  . Alcohol use: No  . Drug use: No     Family History Family History  Problem Relation Age of Onset  . Hypertension Other   . Stroke Other   . Heart attack Other   . Stroke Sister   . Heart attack Brother     Allergies  Allergen Reactions  . Diovan [Valsartan] Cough  . Gabapentin Other (See Comments)    Sedation at all doses  . Hydralazine Itching  . Lisinopril Cough  . Pregabalin Other (See Comments)    Sedation at all doses  . Levofloxacin Other (See Comments)    Too many side effects. Nausea, vomiting, upset stomach, increased confusion, etc Other reaction(s): Confusion Nausea, chills Nausea, chills   . Sulfamethoxazole-Trimethoprim Other  (See Comments)    Too many side effects. Nausea, vomiting, upset stomach, increased confusion, etc   REVIEW OF SYSTEMS(Negative unless checked)  Constitutional: [] Weight loss[] Fever[] Chills Cardiac:[] Chest pain[] Chest pressure[] Palpitations [] Shortness of breath when laying flat [] Shortness of breath at rest [] Shortness of breath with exertion. Vascular: [] Pain in legs with walking[] Pain in legsat rest[] Pain in legs when laying flat [] Claudication [] Pain in feet when walking [] Pain in feet at rest [] Pain in feet when laying flat [] History of DVT [] Phlebitis [x] Swelling in legs [] Varicose veins [] Non-healing ulcers Pulmonary: [] Uses home oxygen [] Productive cough[] Hemoptysis [] Wheeze [] COPD [] Asthma Neurologic: [] Dizziness [] Blackouts [] Seizures [] History of stroke [] History of TIA[] Aphasia [] Temporary blindness[] Dysphagia [] Weaknessor numbness in arms [] Weakness or numbnessin legs Musculoskeletal: [x] Arthritis [] Joint swelling [] Joint pain [x] Low back pain Hematologic:[] Easy bruising[] Easy bleeding [] Hypercoagulable state [] Anemic  Gastrointestinal:[x] Blood in stool[] Vomiting blood[] Gastroesophageal reflux/heartburn[] Abdominal pain Genitourinary: [] Chronic kidney disease [x] Difficulturination [] Frequenturination [] Burning with urination[] Hematuria Skin: [] Rashes [] Ulcers [] Wounds Psychological: [] History of anxiety[] History of major depression.   Physical Examination  BP (!) 148/80 (BP Location: Left Arm, Patient Position: Sitting)   Pulse 70   Ht 6\' 2"  (1.88 m)   Wt 215 lb 6.4 oz (97.7 kg)   SpO2 (!) 18%   BMI 27.66 kg/m  Gen:  WD/WN, NAD.  Appears much younger than stated age Head: [place]/AT, No temporalis wasting. Ear/Nose/Throat: Hearing grossly intact, nares w/o erythema or drainage Eyes: Conjunctiva clear. Sclera non-icteric Neck: Supple.  Trachea  midline Pulmonary:  Good air movement, no use of accessory muscles.  Cardiac: RRR, no JVD Vascular:  Vessel Right Left  Radial Palpable Palpable                                   Gastrointestinal: soft, non-tender/non-distended. Musculoskeletal: M/S 5/5 throughout.  No deformity or atrophy. No edema. Neurologic: Sensation grossly intact in extremities.  Symmetrical.  Speech is fluent.  Psychiatric: Judgment intact, Mood & affect appropriate for pt's clinical situation. Dermatologic: No rashes or ulcers noted.  No cellulitis or open wounds.       Labs No results found for this or any previous visit (from the past 2160 hour(s)).  Radiology No results found.  Assessment/Plan Benign hypertension blood pressure control important in reducing the progression of atherosclerotic disease and AAA growth. On appropriate oral medications.   Diabetes mellitus, type 2 (HCC) blood glucose control important in reducing the progression of atherosclerotic disease. Also, involved in wound healing. On appropriate medications.   HLD (hyperlipidemia) lipid control important in reducing the progression of atherosclerotic disease. Continue statin therapy   AAA (abdominal aortic aneurysm) without rupture (HCC) Aortic duplex shows a stable 5.1 cm infrarenal abdominal aortic aneurysm with no change from  his previous study 6 months ago. Given his advanced age and stable status of his aneurysm, continued medical management and observation with six-month duplex will be planned.  We would defer repair until this reached at least 5.5 cm in maximal diameter.   Leotis Pain, MD  06/20/2018 11:03 AM    This note was created with Dragon medical transcription system.  Any errors from dictation are purely unintentional

## 2018-06-20 NOTE — Patient Instructions (Signed)
Abdominal Aortic Aneurysm Blood pumps away from the heart through tubes (blood vessels) called arteries. Aneurysms are weak or damaged places in the wall of an artery. It bulges out like a balloon. An abdominal aortic aneurysm happens in the main artery of the body (aorta). It can burst or tear, causing bleeding inside the body. This is an emergency. It needs treatment right away. What are the causes? The exact cause is unknown. Things that could cause this problem include:  Fat and other substances building up in the lining of a tube.  Swelling of the walls of a blood vessel.  Certain tissue diseases.  Belly (abdominal) trauma.  An infection in the main artery of the body.  What increases the risk? There are things that make it more likely for you to have an aneurysm. These include:  Being over the age of 82 years old.  Having high blood pressure (hypertension).  Being a male.  Being white.  Being very overweight (obese).  Having a family history of aneurysm.  Using tobacco products.  What are the signs or symptoms? Symptoms depend on the size of the aneurysm and how fast it grows. There may not be symptoms. If symptoms occur, they can include:  Pain (belly, side, lower back, or groin).  Feeling full after eating a small amount of food.  Feeling sick to your stomach (nauseous), throwing up (vomiting), or both.  Feeling a lump in your belly that feels like it is beating (pulsating).  Feeling like you will pass out (faint).  How is this treated?  Medicine to control blood pressure and pain.  Imaging tests to see if the aneurysm gets bigger.  Surgery. How is this prevented? To lessen your chance of getting this condition:  Stop smoking. Stop chewing tobacco.  Limit or avoid alcohol.  Keep your blood pressure, blood sugar, and cholesterol within normal limits.  Eat less salt.  Eat foods low in saturated fats and cholesterol. These are found in animal and  whole dairy products.  Eat more fiber. Fiber is found in whole grains, vegetables, and fruits.  Keep a healthy weight.  Stay active and exercise often.  This information is not intended to replace advice given to you by your health care provider. Make sure you discuss any questions you have with your health care provider. Document Released: 10/23/2012 Document Revised: 12/04/2015 Document Reviewed: 07/28/2012 Elsevier Interactive Patient Education  2017 [employer]  

## 2018-06-21 ENCOUNTER — Encounter: Payer: Self-pay | Admitting: Urology

## 2018-06-21 ENCOUNTER — Other Ambulatory Visit: Payer: Medicare Other | Admitting: Urology

## 2018-06-21 ENCOUNTER — Telehealth: Payer: Self-pay | Admitting: Urology

## 2018-06-21 NOTE — Telephone Encounter (Signed)
Pt was a no show for cysto today

## 2018-07-24 ENCOUNTER — Ambulatory Visit: Payer: Medicare Other | Admitting: Urology

## 2018-07-24 ENCOUNTER — Encounter: Payer: Self-pay | Admitting: Urology

## 2018-07-24 VITALS — BP 153/87 | HR 69 | Ht 70.0 in | Wt 218.0 lb

## 2018-07-24 DIAGNOSIS — C679 Malignant neoplasm of bladder, unspecified: Secondary | ICD-10-CM

## 2018-07-24 DIAGNOSIS — H3530 Unspecified macular degeneration: Secondary | ICD-10-CM | POA: Insufficient documentation

## 2018-07-24 DIAGNOSIS — J309 Allergic rhinitis, unspecified: Secondary | ICD-10-CM | POA: Insufficient documentation

## 2018-07-24 DIAGNOSIS — Z950 Presence of cardiac pacemaker: Secondary | ICD-10-CM | POA: Insufficient documentation

## 2018-07-24 DIAGNOSIS — N390 Urinary tract infection, site not specified: Secondary | ICD-10-CM

## 2018-07-24 DIAGNOSIS — Z8669 Personal history of other diseases of the nervous system and sense organs: Secondary | ICD-10-CM | POA: Insufficient documentation

## 2018-07-24 LAB — URINALYSIS, COMPLETE
BILIRUBIN UA: NEGATIVE
Glucose, UA: NEGATIVE
KETONES UA: NEGATIVE
NITRITE UA: NEGATIVE
PH UA: 5.5 (ref 5.0–7.5)
Specific Gravity, UA: 1.02 (ref 1.005–1.030)
UUROB: 0.2 mg/dL (ref 0.2–1.0)

## 2018-07-24 LAB — MICROSCOPIC EXAMINATION

## 2018-07-24 NOTE — Progress Notes (Signed)
Blake [name] was scheduled for cystoscopy today however urinalysis shows greater than 30 WBC/greater than 30 RBC.  Urine culture was ordered.  His cystoscopy was postponed.

## 2018-07-27 ENCOUNTER — Telehealth: Payer: Self-pay | Admitting: Urology

## 2018-07-27 NOTE — Telephone Encounter (Signed)
Dr. Primus Bravo is wanting to know results of her fathers urine culture. Please advise at [phone].

## 2018-07-27 NOTE — Telephone Encounter (Signed)
Patients Urine Culture is positive. Please advise

## 2018-07-28 ENCOUNTER — Other Ambulatory Visit: Payer: Self-pay | Admitting: Urology

## 2018-07-28 LAB — CULTURE, URINE COMPREHENSIVE

## 2018-07-28 MED ORDER — DOXYCYCLINE HYCLATE 100 MG PO CAPS: 100.0000 mg | ORAL_CAPSULE | Freq: Two times a day (BID) | ORAL | 0 refills | Status: DC

## 2018-07-28 NOTE — Telephone Encounter (Signed)
Patient's daughter notified and pharmacy update, script sent in

## 2018-07-28 NOTE — Telephone Encounter (Signed)
-----   Message from Abbie Sons, MD sent at 07/28/2018 12:45 PM EST ----- Urine culture grew mixed flora but colony count greater than 100,000 mL.  Recommend doxycycline 100 mg twice daily for 7 days.  Please contact his daughter Dr. Primus Bravo and let her know the plan.  There is also not a pharmacy in epic on where to send the Rx.

## 2018-07-28 NOTE — Progress Notes (Signed)
Urine culture grew mixed flora but colony count greater than 100,000 mL.  Recommend doxycycline 100 mg twice daily for 7 days.  Please contact his daughter Dr. Primus Bravo and let her know the plan.  There is also not a pharmacy in epic on where to send the Rx.

## 2018-07-28 NOTE — Telephone Encounter (Signed)
[facility] is the pharmacy of choice, Please let family know results and that the Rx has been sent as they have called asking for results again, states pt is getting worse. Please advise. Thanks

## 2018-07-28 NOTE — Telephone Encounter (Signed)
The final result and sensitivities are not back yet.  We will need to wait for those results before starting antibiotics.

## 2018-08-14 ENCOUNTER — Other Ambulatory Visit: Payer: Self-pay | Admitting: Internal Medicine

## 2018-08-14 DIAGNOSIS — R7989 Other specified abnormal findings of blood chemistry: Secondary | ICD-10-CM

## 2018-08-15 ENCOUNTER — Other Ambulatory Visit: Payer: Self-pay | Admitting: Internal Medicine

## 2018-09-06 ENCOUNTER — Other Ambulatory Visit: Payer: Self-pay | Admitting: Urology

## 2018-09-06 MED ORDER — CEPHALEXIN 500 MG PO TABS: 500.0000 mg | ORAL_TABLET | Freq: Every day | ORAL | 1 refills | Status: DC

## 2018-10-05 ENCOUNTER — Telehealth: Payer: Self-pay | Admitting: Urology

## 2018-10-05 MED ORDER — CEFUROXIME AXETIL 250 MG PO TABS: 250.0000 mg | ORAL_TABLET | Freq: Two times a day (BID) | ORAL | 0 refills | Status: AC

## 2018-10-05 NOTE — Telephone Encounter (Signed)
I spoke with patient's daughter Dr. Lazarus Salines.  She was concerned about a worsening urinary tract infection.  He is on low-dose Keflex suppression.  He has had some increased confusion and his urine is cloudy.  No fever or chills.  Due to COVID-19 risks I will send in Rx cefuroxime 250 mg twice daily.  He will stop the cephalexin suppression while taking this course.  If he has worsening symptoms we will need to send a urine culture or significant symptoms go to the ED.

## 2018-10-09 ENCOUNTER — Telehealth: Payer: Self-pay | Admitting: Radiology

## 2018-10-09 NOTE — Telephone Encounter (Signed)
This culture was resistant to standard antibiotics so there is not an effective prophylactic antibiotic that could prevent Pseudomonas infections.

## 2018-10-09 NOTE — Telephone Encounter (Signed)
Daughter called to report urine culture results for sample collected at [facility] on 10/05/2018. Urine culture was positive for Pseudomonas aeruginosa & sensitivities are listed in the [facility] lab results. Daughter asks if maintenance antibiotic should be changed.

## 2018-10-10 NOTE — Telephone Encounter (Signed)
Noted  

## 2018-10-10 NOTE — Telephone Encounter (Signed)
Notified daughter of Dr Dene Gentry message below. Daughter expresses understanding & reports patient is doing much better since starting cefuroxime.     Abbie Sons, MD  Physician  Urology    This culture was resistant to standard antibiotics so there is not an effective prophylactic antibiotic that could prevent Pseudomonas infections.

## 2018-10-18 ENCOUNTER — Telehealth: Payer: Self-pay | Admitting: Urology

## 2018-10-18 DIAGNOSIS — N390 Urinary tract infection, site not specified: Secondary | ICD-10-CM

## 2018-10-18 NOTE — Telephone Encounter (Signed)
Pt just finished ABX (Cefuroxime Axetil) 250 mg per tablet, he took twice per day.  Daughter wants to know if she needs to bring in a urine sample from pt.  He also takes ABX (Keflex 500 mg once per day) that he was told to stop while taking the Cefuroxime.   She wants to know if pt needs to go back on this.  Please call [place] [phone]

## 2018-10-19 NOTE — Telephone Encounter (Signed)
Yes if she can bring in a specimen in a sterile container this week or early next week will repeat a urine culture.  Go ahead and restart cephalexin suppression.

## 2018-10-19 NOTE — Telephone Encounter (Signed)
Patient's daughter notified will drop off UA on Monday, order placed

## 2018-10-23 ENCOUNTER — Other Ambulatory Visit: Payer: Self-pay

## 2018-10-25 ENCOUNTER — Encounter: Payer: Self-pay | Admitting: Urology

## 2018-11-04 MED ORDER — CEFPODOXIME PROXETIL 200 MG PO TABS: 200.0000 mg | ORAL_TABLET | Freq: Two times a day (BID) | ORAL | Status: DC

## 2018-11-04 NOTE — Progress Notes (Signed)
Patient's daughter called.  He is having some lethargy and decreased p.o. intake.  Is also having low-grade temperature.  He has a history of recurrent UTIs.  She is very worried about coming to the emergency department due to the pandemic.  He has a pediatric dentist.  So therefore she would like to avoid going out of the house as much as possible with her father being 85 years old.  For this reason, I called in Cefpodoxime 200 mg daily.  He will take this for 7 days.  She will call if symptoms worsen or go to the emergency department.

## 2018-11-04 NOTE — Addendum Note (Signed)
Addended by: Link Snuffer D III on: 11/04/2018 02:47 PM   Modules accepted: Orders

## 2018-11-05 ENCOUNTER — Emergency Department: Payer: Medicare Other

## 2018-11-05 ENCOUNTER — Inpatient Hospital Stay
Admission: EM | Admit: 2018-11-05 | Discharge: 2018-11-09 | DRG: 683 | Disposition: A | Discharge status: Home Health | Payer: Medicare Other | Attending: Internal Medicine | Admitting: Internal Medicine

## 2018-11-05 ENCOUNTER — Encounter: Payer: Self-pay | Admitting: Urology

## 2018-11-05 ENCOUNTER — Other Ambulatory Visit: Payer: Self-pay

## 2018-11-05 ENCOUNTER — Inpatient Hospital Stay: Payer: Medicare Other

## 2018-11-05 DIAGNOSIS — I714 Abdominal aortic aneurysm, without rupture: Secondary | ICD-10-CM | POA: Diagnosis not present

## 2018-11-05 DIAGNOSIS — E860 Dehydration: Secondary | ICD-10-CM | POA: Diagnosis present

## 2018-11-05 DIAGNOSIS — R531 Weakness: Secondary | ICD-10-CM

## 2018-11-05 DIAGNOSIS — Z87891 Personal history of nicotine dependence: Secondary | ICD-10-CM | POA: Diagnosis not present

## 2018-11-05 DIAGNOSIS — Z87440 Personal history of urinary (tract) infections: Secondary | ICD-10-CM | POA: Diagnosis not present

## 2018-11-05 DIAGNOSIS — I129 Hypertensive chronic kidney disease with stage 1 through stage 4 chronic kidney disease, or unspecified chronic kidney disease: Secondary | ICD-10-CM | POA: Diagnosis present

## 2018-11-05 DIAGNOSIS — Z8546 Personal history of malignant neoplasm of prostate: Secondary | ICD-10-CM

## 2018-11-05 DIAGNOSIS — H3530 Unspecified macular degeneration: Secondary | ICD-10-CM | POA: Diagnosis present

## 2018-11-05 DIAGNOSIS — Z8249 Family history of ischemic heart disease and other diseases of the circulatory system: Secondary | ICD-10-CM

## 2018-11-05 DIAGNOSIS — E785 Hyperlipidemia, unspecified: Secondary | ICD-10-CM | POA: Diagnosis present

## 2018-11-05 DIAGNOSIS — N183 Chronic kidney disease, stage 3 (moderate): Secondary | ICD-10-CM | POA: Diagnosis present

## 2018-11-05 DIAGNOSIS — K551 Chronic vascular disorders of intestine: Secondary | ICD-10-CM | POA: Diagnosis present

## 2018-11-05 DIAGNOSIS — D631 Anemia in chronic kidney disease: Secondary | ICD-10-CM | POA: Diagnosis present

## 2018-11-05 DIAGNOSIS — N136 Pyonephrosis: Secondary | ICD-10-CM | POA: Diagnosis present

## 2018-11-05 DIAGNOSIS — Z515 Encounter for palliative care: Secondary | ICD-10-CM | POA: Diagnosis present

## 2018-11-05 DIAGNOSIS — Z7189 Other specified counseling: Secondary | ICD-10-CM | POA: Diagnosis not present

## 2018-11-05 DIAGNOSIS — Z8701 Personal history of pneumonia (recurrent): Secondary | ICD-10-CM | POA: Diagnosis not present

## 2018-11-05 DIAGNOSIS — R569 Unspecified convulsions: Secondary | ICD-10-CM | POA: Diagnosis not present

## 2018-11-05 DIAGNOSIS — C679 Malignant neoplasm of bladder, unspecified: Secondary | ICD-10-CM | POA: Diagnosis present

## 2018-11-05 DIAGNOSIS — Z79899 Other long term (current) drug therapy: Secondary | ICD-10-CM

## 2018-11-05 DIAGNOSIS — R6889 Other general symptoms and signs: Secondary | ICD-10-CM

## 2018-11-05 DIAGNOSIS — Z882 Allergy status to sulfonamides status: Secondary | ICD-10-CM

## 2018-11-05 DIAGNOSIS — E1122 Type 2 diabetes mellitus with diabetic chronic kidney disease: Secondary | ICD-10-CM | POA: Diagnosis present

## 2018-11-05 DIAGNOSIS — N179 Acute kidney failure, unspecified: Secondary | ICD-10-CM | POA: Diagnosis present

## 2018-11-05 DIAGNOSIS — I701 Atherosclerosis of renal artery: Secondary | ICD-10-CM | POA: Diagnosis present

## 2018-11-05 DIAGNOSIS — Z888 Allergy status to other drugs, medicaments and biological substances status: Secondary | ICD-10-CM

## 2018-11-05 DIAGNOSIS — Z950 Presence of cardiac pacemaker: Secondary | ICD-10-CM

## 2018-11-05 DIAGNOSIS — I482 Chronic atrial fibrillation, unspecified: Secondary | ICD-10-CM | POA: Diagnosis present

## 2018-11-05 LAB — URINE DRUG SCREEN, QUALITATIVE (ARMC ONLY)
Amphetamines, Ur Screen: NOT DETECTED
Barbiturates, Ur Screen: NOT DETECTED
Benzodiazepine, Ur Scrn: NOT DETECTED
Cannabinoid 50 Ng, Ur ~~LOC~~: NOT DETECTED
Cocaine Metabolite,Ur ~~LOC~~: NOT DETECTED
MDMA (Ecstasy)Ur Screen: NOT DETECTED
Methadone Scn, Ur: NOT DETECTED
Opiate, Ur Screen: NOT DETECTED
Phencyclidine (PCP) Ur S: NOT DETECTED
Tricyclic, Ur Screen: NOT DETECTED

## 2018-11-05 LAB — COMPREHENSIVE METABOLIC PANEL
ALT: 15 U/L (ref 0–44)
AST: 16 U/L (ref 15–41)
Albumin: 3 g/dL — ABNORMAL LOW (ref 3.5–5.0)
Alkaline Phosphatase: 75 U/L (ref 38–126)
Anion gap: 16 — ABNORMAL HIGH (ref 5–15)
BUN: 86 mg/dL — ABNORMAL HIGH (ref 8–23)
CO2: 16 mmol/L — ABNORMAL LOW (ref 22–32)
Calcium: 8.1 mg/dL — ABNORMAL LOW (ref 8.9–10.3)
Chloride: 102 mmol/L (ref 98–111)
Creatinine, Ser: 5.81 mg/dL — ABNORMAL HIGH (ref 0.61–1.24)
GFR calc Af Amer: 9 mL/min — ABNORMAL LOW (ref >60)
GFR calc non Af Amer: 8 mL/min — ABNORMAL LOW (ref >60)
Glucose, Bld: 180 mg/dL — ABNORMAL HIGH (ref 70–99)
Potassium: 3.8 mmol/L (ref 3.5–5.1)
Sodium: 134 mmol/L — ABNORMAL LOW (ref 135–145)
Total Bilirubin: 0.8 mg/dL (ref 0.3–1.2)
Total Protein: 7.5 g/dL (ref 6.5–8.1)

## 2018-11-05 LAB — CBC WITH DIFFERENTIAL/PLATELET
Abs Immature Granulocytes: 0.06 10*3/uL (ref 0.00–0.07)
Basophils Absolute: 0 10*3/uL (ref 0.0–0.1)
Basophils Relative: 0 %
Eosinophils Absolute: 0.1 10*3/uL (ref 0.0–0.5)
Eosinophils Relative: 1 %
HCT: 30.3 % — ABNORMAL LOW (ref 39.0–52.0)
Hemoglobin: 9.7 g/dL — ABNORMAL LOW (ref 13.0–17.0)
Immature Granulocytes: 1 %
Lymphocytes Relative: 6 %
Lymphs Abs: 0.6 10*3/uL — ABNORMAL LOW (ref 0.7–4.0)
MCH: 27.5 pg (ref 26.0–34.0)
MCHC: 32 g/dL (ref 30.0–36.0)
MCV: 85.8 fL (ref 80.0–100.0)
Monocytes Absolute: 0.8 10*3/uL (ref 0.1–1.0)
Monocytes Relative: 8 %
Neutro Abs: 8.9 10*3/uL — ABNORMAL HIGH (ref 1.7–7.7)
Neutrophils Relative %: 84 %
Platelets: 116 10*3/uL — ABNORMAL LOW (ref 150–400)
RBC: 3.53 MIL/uL — ABNORMAL LOW (ref 4.22–5.81)
RDW: 15.9 % — ABNORMAL HIGH (ref 11.5–15.5)
WBC: 10.6 10*3/uL — ABNORMAL HIGH (ref 4.0–10.5)
nRBC: 0 % (ref 0.0–0.2)

## 2018-11-05 LAB — URINALYSIS, COMPLETE (UACMP) WITH MICROSCOPIC
Bilirubin Urine: NEGATIVE
Glucose, UA: NEGATIVE mg/dL
Ketones, ur: NEGATIVE mg/dL
Nitrite: NEGATIVE
Protein, ur: NEGATIVE mg/dL
Specific Gravity, Urine: 1.009 (ref 1.005–1.030)
pH: 5 (ref 5.0–8.0)

## 2018-11-05 LAB — CK: Total CK: 45 U/L — ABNORMAL LOW (ref 49–397)

## 2018-11-05 LAB — GLUCOSE, CAPILLARY: Glucose-Capillary: 152 mg/dL — ABNORMAL HIGH (ref 70–99)

## 2018-11-05 MED ORDER — PANTOPRAZOLE SODIUM 40 MG PO TBEC
40.0000 mg | DELAYED_RELEASE_TABLET | Freq: Every day | ORAL | Status: DC
  Administered 2018-11-06 – 2018-11-09 (×4): 40 mg via ORAL
  Filled 2018-11-05 (×4): qty 1

## 2018-11-05 MED ORDER — ONDANSETRON HCL 4 MG PO TABS: 4.0000 mg | ORAL_TABLET | Freq: Four times a day (QID) | ORAL | Status: DC | PRN

## 2018-11-05 MED ORDER — SODIUM CHLORIDE 0.9 % IV SOLN
INTRAVENOUS | Status: DC
  Administered 2018-11-05 – 2018-11-07 (×4): via INTRAVENOUS

## 2018-11-05 MED ORDER — ROSUVASTATIN CALCIUM 10 MG PO TABS
5.0000 mg | ORAL_TABLET | Freq: Every day | ORAL | Status: DC
  Administered 2018-11-05 – 2018-11-08 (×3): 5 mg via ORAL
  Filled 2018-11-05 (×3): qty 1

## 2018-11-05 MED ORDER — FAMOTIDINE 20 MG PO TABS
20.0000 mg | ORAL_TABLET | Freq: Every day | ORAL | Status: DC
  Administered 2018-11-06 – 2018-11-09 (×4): 20 mg via ORAL
  Filled 2018-11-05 (×4): qty 1

## 2018-11-05 MED ORDER — TAMSULOSIN HCL 0.4 MG PO CAPS
0.4000 mg | ORAL_CAPSULE | Freq: Two times a day (BID) | ORAL | Status: DC
  Administered 2018-11-05 – 2018-11-09 (×7): 0.4 mg via ORAL
  Filled 2018-11-05 (×7): qty 1

## 2018-11-05 MED ORDER — ACETAMINOPHEN 650 MG RE SUPP: 650.0000 mg | Freq: Four times a day (QID) | RECTAL | Status: DC | PRN

## 2018-11-05 MED ORDER — INSULIN ASPART 100 UNIT/ML ~~LOC~~ SOLN: 0.0000 [IU] | Freq: Every day | SUBCUTANEOUS | Status: DC

## 2018-11-05 MED ORDER — IPRATROPIUM-ALBUTEROL 0.5-2.5 (3) MG/3ML IN SOLN
3.0000 mL | Freq: Once | RESPIRATORY_TRACT | Status: AC
  Administered 2018-11-05: 3 mL via RESPIRATORY_TRACT
  Filled 2018-11-05: qty 3

## 2018-11-05 MED ORDER — ONDANSETRON HCL 4 MG/2ML IJ SOLN: 4.0000 mg | Freq: Four times a day (QID) | INTRAMUSCULAR | Status: DC | PRN

## 2018-11-05 MED ORDER — FINASTERIDE 5 MG PO TABS
5.0000 mg | ORAL_TABLET | Freq: Every day | ORAL | Status: DC
  Administered 2018-11-06 – 2018-11-09 (×4): 5 mg via ORAL
  Filled 2018-11-05 (×4): qty 1

## 2018-11-05 MED ORDER — SODIUM BICARBONATE 650 MG PO TABS
650.0000 mg | ORAL_TABLET | Freq: Two times a day (BID) | ORAL | Status: DC
  Administered 2018-11-05 – 2018-11-09 (×6): 650 mg via ORAL
  Filled 2018-11-05 (×9): qty 1

## 2018-11-05 MED ORDER — ENOXAPARIN SODIUM 30 MG/0.3ML ~~LOC~~ SOLN
30.0000 mg | SUBCUTANEOUS | Status: DC
  Filled 2018-11-05: qty 0.3

## 2018-11-05 MED ORDER — INSULIN ASPART 100 UNIT/ML ~~LOC~~ SOLN
0.0000 [IU] | Freq: Three times a day (TID) | SUBCUTANEOUS | Status: DC
  Administered 2018-11-07: 2 [IU] via SUBCUTANEOUS
  Administered 2018-11-07: 3 [IU] via SUBCUTANEOUS
  Administered 2018-11-08: 2 [IU] via SUBCUTANEOUS
  Administered 2018-11-08: 3 [IU] via SUBCUTANEOUS
  Administered 2018-11-09: 13:00:00 2 [IU] via SUBCUTANEOUS
  Filled 2018-11-05 (×4): qty 1

## 2018-11-05 MED ORDER — ACETAMINOPHEN 325 MG PO TABS
650.0000 mg | ORAL_TABLET | Freq: Four times a day (QID) | ORAL | Status: DC | PRN
  Administered 2018-11-08: 650 mg via ORAL
  Filled 2018-11-05: qty 2

## 2018-11-05 MED ORDER — METOPROLOL TARTRATE 25 MG PO TABS
37.5000 mg | ORAL_TABLET | Freq: Two times a day (BID) | ORAL | Status: DC
  Administered 2018-11-05 – 2018-11-06 (×2): 37.5 mg via ORAL
  Filled 2018-11-05 (×2): qty 2

## 2018-11-05 MED ORDER — LORAZEPAM 2 MG/ML IJ SOLN: 1.0000 mg | INTRAMUSCULAR | Status: DC | PRN

## 2018-11-05 MED ORDER — SODIUM CHLORIDE 0.9 % IV BOLUS
1000.0000 mL | Freq: Once | INTRAVENOUS | Status: AC
  Administered 2018-11-05: 1000 mL via INTRAVENOUS

## 2018-11-05 MED ORDER — POLYETHYLENE GLYCOL 3350 17 G PO PACK: 17.0000 g | PACK | Freq: Every day | ORAL | Status: DC | PRN

## 2018-11-05 NOTE — ED Provider Notes (Signed)
[facility] Emergency Department Provider Note  ____________________________________________   I have reviewed the triage vital signs and the nursing notes.   HISTORY  Chief Complaint Seizure like episode  History limited by: Not Limited, some history obtained over the phone from daughter Lazarus Salines   HPI BOND GRIESHOP is a 85 y.o. male who presents to the emergency department today after an apparent seizure like activity. Discussed the events with daughter. She states he was sitting on a chair when he started having generalized shaking. It lasted roughly 10-15 seconds. He did seem confused to her after the episode. The patient did have an episode of vomiting. She states that for the past 2 weeks he has not been feeling well. The patient states he has felt week. Does have a history of frequent UTIs so he was placed on abx to treat presumptive UTI. The patient denies any pain. No history of recent trauma. Daughter states he has had two similar episodes in the past, in 2006 and 2014 or 15. Does not carry the diagnosis of seizure disorder.    Records reviewed. Per medical record review patient has a history of DM, HLD, HTN.   Past Medical History:  Diagnosis Date  . AAA (abdominal aortic aneurysm) ([place])   . Arrhythmia   . Diabetes mellitus without complication ([place])   . GI bleed   . Heart murmur   . Hyperlipidemia   . Hypertension   . Prostate cancer [facility])     Patient Active Problem List   Diagnosis Date Noted  . Allergic rhinitis 07/24/2018  . Cardiac pacemaker in situ 07/24/2018  . History of sleep apnea 07/24/2018  . Macular degeneration 07/24/2018  . Trigeminal neuralgia pain 01/11/2018  . TMJ pain dysfunction syndrome 01/11/2018  . Recurrent UTI 09/26/2017  . 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 Pressure injury of skin 09/12/2017  . SIRS (systemic inflammatory response syndrome) ([place]) 09/10/2017  . Chronic cystitis 08/25/2017   . Sepsis ([place]) 06/10/2017  . CAP (community acquired pneumonia) 06/10/2017  . Infection due to enterococcus 06/07/2017  . UTI (urinary tract infection) 05/17/2017  . Blood in stool   . Hemorrhage of rectum and anus   . Lower GI bleed 10/20/2016  . Near syncope   . Acute renal failure superimposed on chronic kidney disease ([place])   . Pneumonia 10/09/2016  . Aspiration pneumonia ([place]) 10/09/2016  . Acute respiratory failure ([place]) 10/09/2016  . Hydronephrosis, bilateral 07/19/2016  . Spinal stenosis of lumbar region with neurogenic claudication 07/19/2016  . AAA (abdominal aortic aneurysm) without rupture ([place]) 04/27/2016  . Arthritis, degenerative 03/31/2016  . A-fib ([place]) 03/31/2016  . HLD (hyperlipidemia) 03/31/2016  . CA of prostate ([place]) 03/31/2016  . Central perforation of tympanic membrane of right ear 03/26/2016  . 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 Presbycusis of left ear with restricted hearing of right ear 03/01/2016  . Chronic kidney disease, stage 3 (moderate) ([place]) 10/30/2015  . Episode of unresponsiveness 10/30/2015  . Sick sinus syndrome ([place]) 10/30/2015  . Malignant neoplasm of overlapping sites of bladder ([place]) 09/26/2015  . Urinary retention 09/17/2015  . Bladder neoplasm of uncertain malignant potential 08/13/2015  . Thrombocytopenia, unspecified ([place]) 07/24/2015  . Fitting or adjustment of cardiac pacemaker 05/09/2015  . SVT (supraventricular tachycardia) ([place]) 04/09/2015  . Diabetes mellitus, type 2 ([place]) 04/09/2015  . Benign hypertension 04/09/2015  . Acute myocardial infarction, initial episode of  care ([place]) 04/09/2015  . Phimosis 01/30/2014  . Bladder outlet obstruction 04/18/2013  . Dysuria 04/18/2013  . Gross hematuria 04/18/2013  . Urethral valve, acquired 04/18/2013  . Incomplete emptying of bladder 11/26/2012  . Personal history of prostate cancer 11/26/2012    Past  Surgical History:  Procedure Laterality Date  . FLEXIBLE SIGMOIDOSCOPY N/A 10/21/2016   Procedure: FLEXIBLE SIGMOIDOSCOPY;  Surgeon: [name], MD;  Location: [facility] ENDOSCOPY;  Service: Endoscopy;  Laterality: N/A;  . JOINT REPLACEMENT    . PACEMAKER INSERTION Left 04/14/2015   Procedure: INSERTION PACEMAKER;  Surgeon: Isaias Cowman, MD;  Location: [facility] ORS;  Service: Cardiovascular;  Laterality: Left;  . PROSTATE SURGERY    . VISCERAL ARTERY INTERVENTION N/A 10/22/2016   Procedure: Visceral Artery Intervention;  Surgeon: Algernon Huxley, MD;  Location: [place] CV LAB;  Service: Cardiovascular;  Laterality: N/A;    Prior to Admission medications   Medication Sig Start Date End Date Taking? Authorizing Provider  azelastine (ASTELIN) 0.1 % nasal spray Place 1 spray into both nostrils 2 (two) times daily. Use in each nostril as directed    [provider]  Cephalexin 500 MG tablet Take 1 tablet (500 mg total) by mouth daily. 09/06/18   Stoioff, Ronda Fairly, MD  doxycycline (VIBRAMYCIN) 100 MG capsule Take 1 capsule (100 mg total) by mouth every 12 (twelve) hours. 07/28/18   Stoioff, Ronda Fairly, MD  doxycycline (VIBRAMYCIN) 100 MG capsule Take 1 capsule (100 mg total) by mouth every 12 (twelve) hours. 07/28/18   Stoioff, Ronda Fairly, MD  finasteride (PROSCAR) 5 MG tablet Take 5 mg by mouth daily.    [provider]  glimepiride (AMARYL) 2 MG tablet 2 tablets in the morning and one tablet at night Patient taking differently: Take 4 mg by mouth 2 (two) times daily.  04/15/15   [name], MD  Iron-Vitamin C (VITRON-C PO) Take 1 tablet by mouth daily.    [provider]  metoprolol tartrate 37.5 MG TABS Take 37.5 mg by mouth 2 (two) times daily. 09/13/17   Gladstone Lighter, MD  Multiple Vitamins-Minerals (PRESERVISION/LUTEIN PO) Take 1 capsule by mouth 2 (two) times daily.    [provider]  multivitamin-iron-minerals-folic acid (CENTRUM) chewable tablet Chew 2  tablets by mouth daily.    [provider]  pantoprazole (PROTONIX) 40 MG tablet Take 40 mg by mouth daily.    [provider]  ranitidine (ZANTAC) 150 MG tablet Take by mouth.    [provider]  rosuvastatin (CRESTOR) 5 MG tablet Take 5 mg by mouth at bedtime.    [provider]  sitaGLIPtin (JANUVIA) 50 MG tablet Take 50 mg by mouth daily.    [provider]  sodium bicarbonate 650 MG tablet Take 1 tablet (650 mg total) by mouth 2 (two) times daily. 09/13/17   Gladstone Lighter, MD  tamsulosin (FLOMAX) 0.4 MG CAPS capsule Take 0.4 mg by mouth 2 (two) times daily.     [provider]  triamcinolone cream (KENALOG) 0.5 % Apply 1 application topically 2 (two) times daily.    [provider]    Allergies Diovan [valsartan]; Gabapentin; Hydralazine; Lisinopril; Pregabalin; Levofloxacin; and Sulfamethoxazole-trimethoprim  Family History  Problem Relation Age of Onset  . Hypertension Other   . Stroke Other   . Heart attack Other   . Stroke Sister   . Heart attack Brother     Social History Social History   Tobacco Use  . Smoking status:  Former Smoker  . Smokeless tobacco: Never Used  Substance Use Topics  . Alcohol use: No  . Drug use: No    Review of Systems Constitutional: Positive for generalized weakness.  Eyes: No visual changes. ENT: No sore throat. Cardiovascular: Denies chest pain. Respiratory: Denies shortness of breath. Gastrointestinal: No abdominal pain.  No nausea, no vomiting.  No diarrhea.   Genitourinary: Negative for dysuria. Musculoskeletal: Negative for back pain. Skin: Negative for rash. Neurological: Positive for seizure like activity ____________________________________________   PHYSICAL EXAM:  VITAL SIGNS: ED Triage Vitals [11/05/18 1521]  Enc Vitals Group     BP 113/67     Pulse Rate (!) 34     Resp (!) 22     Temp 97.8 F (36.6 C)     Temp Source Oral     SpO2 95 %     Weight  218 lb (98.9 kg)     Height 6\' 2"  (1.88 m)     Head Circumference      Peak Flow      Pain Score 0   Constitutional: Alert and oriented.  Eyes: Conjunctivae are normal.  ENT      Head: Normocephalic and atraumatic.      Nose: No congestion/rhinnorhea.      Mouth/Throat: Mucous membranes are moist.      Neck: No stridor. Hematological/Lymphatic/Immunilogical: No cervical lymphadenopathy. Cardiovascular: Normal rate, regular rhythm.  No murmurs, rubs, or gallops.  Respiratory: Normal respiratory effort without tachypnea nor retractions. Breath sounds are clear and equal bilaterally. No wheezes/rales/rhonchi. Gastrointestinal: Soft and non tender. No rebound. No guarding.  Genitourinary: Deferred Musculoskeletal: Normal range of motion in all extremities. No lower extremity edema. Neurologic:  Normal speech and language. No gross focal neurologic deficits are appreciated.  Skin:  Skin is warm, dry and intact. No rash noted. Psychiatric: Mood and affect are normal. Speech and behavior are normal. Patient exhibits appropriate insight and judgment.  ____________________________________________    LABS (pertinent positives/negatives)  CBC wbc 10.6, hgb 9.7, plt 116 CMP na 134, k 3.8, glu 180, cr 5.81 ____________________________________________   EKG  I, Nance Pear, attending physician, personally viewed and interpreted this EKG  EKG Time: 1519 Rate: 83 Rhythm: afib with occasional v paced beats Axis: left axis deviation Intervals: qtc 541 QRS: RBBB, LVH ST changes: no st elevation Impression: abnormal ekg   ____________________________________________    RADIOLOGY  CT head No acute abnormality  ____________________________________________   PROCEDURES  Procedures  ____________________________________________   INITIAL IMPRESSION / ASSESSMENT AND PLAN / ED COURSE  Pertinent labs & imaging results that were available during my care of the patient were  reviewed by me and considered in my medical decision making (see chart for details).   Patient presented to the emergency department today because of concerns for seizure-like episode.  The patient apparently has had 2 similar episodes in the past although does not carry the diagnosis of epilepsy.  The patient was found to have significant acute kidney injury.  This could explain the patient's weakness over the past 2 weeks.  I did discuss this with the patient.  He will require admission for IV fluids.  Head CT was obtained which not show any acute abnormality.  ____________________________________________   FINAL CLINICAL IMPRESSION(S) / ED DIAGNOSES  Final diagnoses:  Seizure-like activity ([place])  AKI (acute kidney injury) ([place])  Weakness     Note: This dictation was prepared with Dragon dictation. Any transcriptional errors that result from this process are unintentional  Nance Pear, MD 11/05/18 [phone]

## 2018-11-05 NOTE — ED Triage Notes (Addendum)
Pt arrives from home via [facility] for possible seizure. Was sitting in recliner when EMS arrived. Was hypotensive initally but in route BP went back to normal. EMS reports CBG 186. EMS reports pt being post-ictal but is more alert upon arrival. Has pacemaker. Hx UTI, taking antibiotics for same. EMS states pt going from a fib to paced rhythm.     Daughter- Lazarus [phone]

## 2018-11-05 NOTE — H&P (Addendum)
[place] [facility] [place] NAME: Blake [name]    MR#:  [id number]  DATE OF BIRTH:  June 10, 1923  DATE OF ADMISSION:  11/05/2018  PRIMARY CARE PHYSICIAN: Adin Hector, MD   REQUESTING/REFERRING PHYSICIAN: Nance Pear, MD  CHIEF COMPLAINT:   Chief Complaint  Patient presents with   Altered Mental Status    HISTORY OF PRESENT ILLNESS:  Blake [name]  is a 85 y.o. male with a known history of hypertension, hyperlipidemia, type 2 diabetes, hx of prostate cancer who presented to the ED with a seizure-like episode today.  Patient is little bit confused and drowsy in the ED, so history obtained from the ED physician and the patient's daughter via the phone.  This morning, he had an episode of generalized shaking of all of his limbs that lasted 10 to 15 seconds and then resolved on its own.  It sounds like he was confused after the episode.  He did have one episode of vomiting.  Patient states that he felt fine immediately prior to the episode.  Apparently this has happened twice in the past, in 2006 and 2014.  Patient has never been on antiepileptic medicines before.  Of note, he was recently treated for UTI as an outpatient.  He does have recurrent UTIs.  He denies any recent dysuria, urinary urgency, urinary frequency, fevers, or chills.  Denies any chest pain or palpitations.  In the ED, he was in A. fib with heart rates in the 70s.  Vitals were otherwise unremarkable.  Labs are significant for creatinine 5.81, WBC 10.6, hemoglobin 9.7.  CT head negative for acute abnormalities.  Hospitalists were called for admission.  PAST MEDICAL HISTORY:   Past Medical History:  Diagnosis Date   AAA (abdominal aortic aneurysm) ([place])    Arrhythmia    Diabetes mellitus without complication ([place])    GI bleed    Heart murmur    Hyperlipidemia    Hypertension    Prostate cancer ([place])     PAST SURGICAL HISTORY:   Past Surgical History:    Procedure Laterality Date   FLEXIBLE SIGMOIDOSCOPY N/A 10/21/2016   Procedure: FLEXIBLE SIGMOIDOSCOPY;  Surgeon: [name], MD;  Location: [facility] ENDOSCOPY;  Service: Endoscopy;  Laterality: N/A;   JOINT REPLACEMENT     PACEMAKER INSERTION Left 04/14/2015   Procedure: INSERTION PACEMAKER;  Surgeon: Isaias Cowman, MD;  Location: [facility] ORS;  Service: Cardiovascular;  Laterality: Left;   PROSTATE SURGERY     VISCERAL ARTERY INTERVENTION N/A 10/22/2016   Procedure: Visceral Artery Intervention;  Surgeon: Algernon Huxley, MD;  Location: [place] CV LAB;  Service: Cardiovascular;  Laterality: N/A;    SOCIAL HISTORY:   Social History   Tobacco Use   Smoking status: Former Smoker   Smokeless tobacco: Never Used  Substance Use Topics   Alcohol use: No    FAMILY HISTORY:   Family History  Problem Relation Age of Onset   Hypertension Other    Stroke Other    Heart attack Other    Stroke Sister    Heart attack Brother     DRUG ALLERGIES:   Allergies  Allergen Reactions   Diovan [Valsartan] Cough   Gabapentin Other (See Comments)    Sedation at all doses   Hydralazine Itching   Lisinopril Cough   Pregabalin Other (See Comments)    Sedation at all doses   Levofloxacin Other (See Comments)    Too many side effects. Nausea, vomiting,  upset stomach, increased confusion, etc Other reaction(s): Confusion Nausea, chills Nausea, chills    Sulfamethoxazole-Trimethoprim Other (See Comments)    Too many side effects. Nausea, vomiting, upset stomach, increased confusion, etc    REVIEW OF SYSTEMS:   Review of Systems  Constitutional: Positive for malaise/fatigue. Negative for chills and fever.  HENT: Negative for congestion and sore throat.   Eyes: Negative for blurred vision and double vision.  Respiratory: Negative for cough and shortness of breath.   Cardiovascular: Negative for chest pain and palpitations.  Gastrointestinal: Positive for vomiting.  Negative for abdominal pain and nausea.  Genitourinary: Negative for dysuria and urgency.  Musculoskeletal: Negative for back pain and neck pain.  Neurological: Positive for seizures. Negative for dizziness and headaches.  Psychiatric/Behavioral: Negative for depression. The patient is not nervous/anxious.     MEDICATIONS AT HOME:   Prior to Admission medications   Medication Sig Start Date End Date Taking? Authorizing Provider  azelastine (ASTELIN) 0.1 % nasal spray Place 1 spray into both nostrils 2 (two) times daily. Use in each nostril as directed    [provider]  Cephalexin 500 MG tablet Take 1 tablet (500 mg total) by mouth daily. 09/06/18   Stoioff, Ronda Fairly, MD  doxycycline (VIBRAMYCIN) 100 MG capsule Take 1 capsule (100 mg total) by mouth every 12 (twelve) hours. 07/28/18   Stoioff, Ronda Fairly, MD  doxycycline (VIBRAMYCIN) 100 MG capsule Take 1 capsule (100 mg total) by mouth every 12 (twelve) hours. 07/28/18   Stoioff, Ronda Fairly, MD  finasteride (PROSCAR) 5 MG tablet Take 5 mg by mouth daily.    [provider]  glimepiride (AMARYL) 2 MG tablet 2 tablets in the morning and one tablet at night Patient taking differently: Take 4 mg by mouth 2 (two) times daily.  04/15/15   [name], MD  Iron-Vitamin C (VITRON-C PO) Take 1 tablet by mouth daily.    [provider]  metoprolol tartrate 37.5 MG TABS Take 37.5 mg by mouth 2 (two) times daily. 09/13/17   Gladstone Lighter, MD  Multiple Vitamins-Minerals (PRESERVISION/LUTEIN PO) Take 1 capsule by mouth 2 (two) times daily.    [provider]  multivitamin-iron-minerals-folic acid (CENTRUM) chewable tablet Chew 2 tablets by mouth daily.    [provider]  pantoprazole (PROTONIX) 40 MG tablet Take 40 mg by mouth daily.    [provider]  ranitidine (ZANTAC) 150 MG tablet Take by mouth.    [provider]  rosuvastatin (CRESTOR) 5 MG tablet Take 5 mg by mouth at bedtime.     [provider]  sitaGLIPtin (JANUVIA) 50 MG tablet Take 50 mg by mouth daily.    [provider]  sodium bicarbonate 650 MG tablet Take 1 tablet (650 mg total) by mouth 2 (two) times daily. 09/13/17   Gladstone Lighter, MD  tamsulosin (FLOMAX) 0.4 MG CAPS capsule Take 0.4 mg by mouth 2 (two) times daily.     [provider]  triamcinolone cream (KENALOG) 0.5 % Apply 1 application topically 2 (two) times daily.    [provider]      VITAL SIGNS:  Blood pressure 113/67, pulse (!) 34, temperature 97.8 F (36.6 C), temperature source Oral, resp. rate (!) 22, height 6\' 2"  (1.88 m), weight 98.9 kg, SpO2 95 %.  PHYSICAL EXAMINATION:  Physical Exam  GENERAL:  85 y.o.-year-old patient lying in the bed with no acute distress.  Drowsy, but awakens to verbal stimuli. EYES: Pupils equal, round, reactive  to light and accommodation. No scleral icterus. Extraocular muscles intact.  HEENT: Head atraumatic, normocephalic. Oropharynx and nasopharynx clear.  NECK:  Supple, no jugular venous distention. No thyroid enlargement, no tenderness.  LUNGS: Normal breath sounds bilaterally, no wheezing, rales,rhonchi or crepitation. No use of accessory muscles of respiration.  CARDIOVASCULAR: Irregularly irregular rhythm, regular rate, S1, S2 normal. No murmurs, rubs, or gallops.  ABDOMEN: Soft, nontender, nondistended. Bowel sounds present. No organomegaly or mass.  EXTREMITIES: No pedal edema, cyanosis, or clubbing.  NEUROLOGIC: Cranial nerves II through XII are intact. Muscle strength 5/5 in all extremities. Sensation intact. Gait not checked.  PSYCHIATRIC: The patient is alert and oriented x 3.  SKIN: No obvious rash, lesion, or ulcer.   LABORATORY PANEL:   CBC Recent Labs  Lab 11/05/18 1530  WBC 10.6*  HGB 9.7*  HCT 30.3*  PLT 116*   ------------------------------------------------------------------------------------------------------------------  Chemistries    Recent Labs  Lab 11/05/18 1530  NA 134*  K 3.8  CL 102  CO2 16*  GLUCOSE 180*  BUN 86*  CREATININE 5.81*  CALCIUM 8.1*  AST 16  ALT 15  ALKPHOS 75  BILITOT 0.8   ------------------------------------------------------------------------------------------------------------------  Cardiac Enzymes No results for input(s): TROPONINI in the last 168 hours. ------------------------------------------------------------------------------------------------------------------  RADIOLOGY:  Ct Head Wo Contrast  Result Date: 11/05/2018 CLINICAL DATA:  85 year old with possible seizure earlier today. Hypotension upon EMS arrival. Possibly postictal upon EMS arrival. EXAM: CT HEAD WITHOUT CONTRAST TECHNIQUE: Contiguous axial images were obtained from the base of the skull through the vertex without intravenous contrast. COMPARISON:  06/16/2014 and earlier. FINDINGS: Brain: Severe age related cortical, deep and cerebellar atrophy, progressive since 61. Moderate changes of small vessel disease of the white matter diffusely, also progressive. No mass lesion. No midline shift. No acute hemorrhage or hematoma. No extra-axial fluid collections. No evidence of acute infarction. Vascular: Severe BILATERAL carotid siphon and RIGHT vertebral artery atherosclerosis. No hyperdense vessel. Skull: No skull fracture or other focal osseous abnormality involving the skull. Sinuses/Orbits: Visualized paranasal sinuses, bilateral mastoid air cells and bilateral middle ear cavities well-aerated. Senile calcifications involving both globes. No significant abnormality involving the orbits or globes. Other: None. IMPRESSION: 1. No acute intracranial abnormality. 2. Severe age related generalized atrophy and moderate chronic microvascular ischemic changes of the white matter, progressive since 2015. Electronically Signed   By: Evangeline Dakin M.D.   On: 11/05/2018 16:07      IMPRESSION AND PLAN:   Seizure-like episode-  patient had generalized shaking for 10 to 15 seconds.  This has happened twice in the past, but patient has never been started on an antiepileptic medicine.  CT head negative. -Neurology consult -Unable to obtain MRI due to pacemaker -EEG ordered -Check UDS -Ativan PRN seizures  AKI in CKD III- likely due to decreased p.o. intake and dehydration.  Creatinine 5.8 (baseline 2.5) -IV fluids -Renal ultrasound -Check CK to rule out rhabdomyolysis in the setting of seizure -Avoid nephrotoxic agents  Bladder cancer with a history of recurrent UTIs- recently treated for UTI as an outpatient. Follows with Dr. Bernardo Heater. -Urinalysis pending -Continue home finasteride and Flomax  Chronic atrial fibrillation- s/p pacemaker. Not on any anticoagulation due to a history of GI bleed. -Continue home metoprolol  Type 2 diabetes-blood sugars elevated in the ED -SSI  Hyperlipidemia-stable -Continue home Crestor  Anemia in chronic kidney disease- hgb at baseline -Monitor  All the records are reviewed and case discussed with ED provider. Management plans discussed with the patient, family and they  are in agreement.  CODE STATUS: Full  TOTAL TIME TAKING CARE OF THIS PATIENT: 45 minutes.    Berna Spare Ambrosio Reuter M.D on 11/05/2018 at 4:27 PM  Between 7am to 6pm - Pager - [phone]  After 6pm go to [email or web address] - password [email or web address] [facility]  [employer] [place] [facility]  Office  [phone]  CC: Primary care physician; Adin Hector, MD   Note: This dictation was prepared with Dragon dictation along with smaller phrase technology. Any transcriptional errors that result from this process are unintentional.

## 2018-11-05 NOTE — ED Notes (Signed)
Attempted to call report. Gave name and number, awaiting call back.

## 2018-11-05 NOTE — ED Notes (Signed)
Pt taken to CT via stretcher.

## 2018-11-05 NOTE — Progress Notes (Signed)
Family Meeting Note  Advance Directive:yes  Today a meeting took place with the Patient and daughter (over the phone).  Patient is able to participate.   The following clinical team members were present during this meeting:MD  The following were discussed:Patient's diagnosis: seizure, AKI, Patient's progosis: Unable to determine and Goals for treatment: Full Code  Additional follow-up to be provided: prn  Time spent during discussion:20 minutes  Evette Doffing, MD

## 2018-11-05 NOTE — ED Notes (Signed)
ED TO INPATIENT HANDOFF REPORT  ED Nurse Name and Phone #:  Anda Kraft [phone]  S Name/Age/Gender Blake [name] 85 y.o. male Room/Bed: ED16A/ED16A  Code Status   Code Status: Prior  Home/SNF/Other Home Patient oriented to: self Is this baseline? No   Triage Complete: Triage complete  Chief Complaint Poss Seizure  Triage Note Pt arrives from home via [facility] for possible seizure. Was sitting in recliner when EMS arrived. Was hypotensive initally but in route BP went back to normal. EMS reports CBG 186. EMS reports pt being post-ictal but is more alert upon arrival. Has pacemaker. Hx UTI, taking antibiotics for same. EMS states pt going from a fib to paced rhythm.     Daughter- Lazarus Salines- [phone]   Allergies Allergies  Allergen Reactions  . Diovan [Valsartan] Cough  . Gabapentin Other (See Comments)    Sedation at all doses  . Hydralazine Itching  . Lisinopril Cough  . Pregabalin Other (See Comments)    Sedation at all doses  . Levofloxacin Other (See Comments)    Too many side effects. Nausea, vomiting, upset stomach, increased confusion, etc Other reaction(s): Confusion Nausea, chills Nausea, chills   . Sulfamethoxazole-Trimethoprim Other (See Comments)    Too many side effects. Nausea, vomiting, upset stomach, increased confusion, etc    Level of Care/Admitting Diagnosis ED Disposition    ED Disposition Condition [place]: [place] [100120]  Level of Care: Med-Surg [16]  Covid Evaluation: N/A  Diagnosis: Seizure-like activity [facility]) [[id number]]  Admitting Physician: Hyman Bible DODD [[id number]]  Attending Physician: Hyman Bible DODD [[id number]]  Estimated length of stay: past midnight tomorrow  Certification:: I certify this patient will need inpatient services for at least 2 midnights  PT Class (Do Not Modify): Inpatient [101]  PT Acc Code (Do Not Modify): Private [1]       B Medical/Surgery History Past Medical  History:  Diagnosis Date  . AAA (abdominal aortic aneurysm) ([place])   . Arrhythmia   . Diabetes mellitus without complication ([place])   . GI bleed   . Heart murmur   . Hyperlipidemia   . Hypertension   . Prostate cancer [facility])    Past Surgical History:  Procedure Laterality Date  . FLEXIBLE SIGMOIDOSCOPY N/A 10/21/2016   Procedure: FLEXIBLE SIGMOIDOSCOPY;  Surgeon: [name], MD;  Location: [facility] ENDOSCOPY;  Service: Endoscopy;  Laterality: N/A;  . JOINT REPLACEMENT    . PACEMAKER INSERTION Left 04/14/2015   Procedure: INSERTION PACEMAKER;  Surgeon: Isaias Cowman, MD;  Location: [facility] ORS;  Service: Cardiovascular;  Laterality: Left;  . PROSTATE SURGERY    . VISCERAL ARTERY INTERVENTION N/A 10/22/2016   Procedure: Visceral Artery Intervention;  Surgeon: Algernon Huxley, MD;  Location: [place] CV LAB;  Service: Cardiovascular;  Laterality: N/A;     A IV Location/Drains/Wounds Patient Lines/Drains/Airways Status   Active Line/Drains/Airways    Name:   Placement date:   Placement time:   Site:   Days:   Peripheral IV 11/05/18 Right Hand   11/05/18    1523    Hand   less than 1   Pressure Injury 09/11/17 Stage I -  Intact skin with non-blanchable redness of a localized area usually over a bony prominence. red callus   09/11/17    -     420          Intake/Output Last 24 hours  Intake/Output Summary (Last 24 hours) at 11/05/2018 1712 Last data filed at  11/05/2018 1711 Gross per 24 hour  Intake 1000 ml  Output -  Net 1000 ml    Labs/Imaging Results for orders placed or performed during the hospital encounter of 11/05/18 (from the past 48 hour(s))  CBC with Differential     Status: Abnormal   Collection Time: 11/05/18  3:30 PM  Result Value Ref Range   WBC 10.6 (H) 4.0 - 10.5 K/uL   RBC 3.53 (L) 4.22 - 5.81 MIL/uL   Hemoglobin 9.7 (L) 13.0 - 17.0 g/dL   HCT 30.3 (L) 39.0 - 52.0 %   MCV 85.8 80.0 - 100.0 fL   MCH 27.5 26.0 - 34.0 pg   MCHC 32.0 30.0 - 36.0 g/dL   RDW  15.9 (H) 11.5 - 15.5 %   Platelets 116 (L) 150 - 400 K/uL    Comment: Immature Platelet Fraction may be clinically indicated, consider ordering this additional test [id number]    nRBC 0.0 0.0 - 0.2 %   Neutrophils Relative % 84 %   Neutro Abs 8.9 (H) 1.7 - 7.7 K/uL   Lymphocytes Relative 6 %   Lymphs Abs 0.6 (L) 0.7 - 4.0 K/uL   Monocytes Relative 8 %   Monocytes Absolute 0.8 0.1 - 1.0 K/uL   Eosinophils Relative 1 %   Eosinophils Absolute 0.1 0.0 - 0.5 K/uL   Basophils Relative 0 %   Basophils Absolute 0.0 0.0 - 0.1 K/uL   Immature Granulocytes 1 %   Abs Immature Granulocytes 0.06 0.00 - 0.07 K/uL    Comment: Performed at [facility], [address] [place], [place] [address]  Comprehensive metabolic panel     Status: Abnormal   Collection Time: 11/05/18  3:30 PM  Result Value Ref Range   Sodium 134 (L) 135 - 145 mmol/L   Potassium 3.8 3.5 - 5.1 mmol/L   Chloride 102 98 - 111 mmol/L   CO2 16 (L) 22 - 32 mmol/L   Glucose, Bld 180 (H) 70 - 99 mg/dL   BUN 86 (H) 8 - 23 mg/dL   Creatinine, Ser 5.81 (H) 0.61 - 1.24 mg/dL   Calcium 8.1 (L) 8.9 - 10.3 mg/dL   Total Protein 7.5 6.5 - 8.1 g/dL   Albumin 3.0 (L) 3.5 - 5.0 g/dL   AST 16 15 - 41 U/L   ALT 15 0 - 44 U/L   Alkaline Phosphatase 75 38 - 126 U/L   Total Bilirubin 0.8 0.3 - 1.2 mg/dL   GFR calc non Af Amer 8 (L) >60 mL/min   GFR calc Af Amer 9 (L) >60 mL/min   Anion gap 16 (H) 5 - 15    Comment: Performed at [facility], [place], [place] [address]   Ct Head Wo Contrast  Result Date: 11/05/2018 CLINICAL DATA:  85 year old with possible seizure earlier today. Hypotension upon EMS arrival. Possibly postictal upon EMS arrival. EXAM: CT HEAD WITHOUT CONTRAST TECHNIQUE: Contiguous axial images were obtained from the base of the skull through the vertex without intravenous contrast. COMPARISON:  06/16/2014 and earlier. FINDINGS: Brain: Severe age related cortical, deep and cerebellar atrophy,  progressive since 65. Moderate changes of small vessel disease of the white matter diffusely, also progressive. No mass lesion. No midline shift. No acute hemorrhage or hematoma. No extra-axial fluid collections. No evidence of acute infarction. Vascular: Severe BILATERAL carotid siphon and RIGHT vertebral artery atherosclerosis. No hyperdense vessel. Skull: No skull fracture or other focal osseous abnormality involving the skull. Sinuses/Orbits: Visualized paranasal  sinuses, bilateral mastoid air cells and bilateral middle ear cavities well-aerated. Senile calcifications involving both globes. No significant abnormality involving the orbits or globes. Other: None. IMPRESSION: 1. No acute intracranial abnormality. 2. Severe age related generalized atrophy and moderate chronic microvascular ischemic changes of the white matter, progressive since 2015. Electronically Signed   By: Evangeline Dakin M.D.   On: 11/05/2018 16:07    Pending Labs Unresulted Labs (From admission, onward)    Start     Ordered   11/05/18 1652  CK  Once,   STAT     11/05/18 1651   11/05/18 1529  Urinalysis, Complete w Microscopic  Once,   STAT     11/05/18 1528   Signed and Held  Basic metabolic panel  Tomorrow morning,   R     Signed and Held   Signed and Held  CBC  Tomorrow morning,   R     Signed and Held   Signed and Held  Urine Drug Screen, Biomedical engineer ([facility] only)  Once,   R     Signed and Held          Vitals/Pain Today's Vitals   11/05/18 1521  BP: 113/67  Pulse: (!) 34  Resp: (!) 22  Temp: 97.8 F (36.6 C)  TempSrc: Oral  SpO2: 95%  Weight: 98.9 kg  Height: 6\' 2"  (1.88 m)  PainSc: 0-No pain    Isolation Precautions No active isolations  Medications Medications  sodium chloride 0.9 % bolus 1,000 mL (0 mLs Intravenous Stopped 11/05/18 1711)    Mobility walks with device High fall risk   Focused Assessments   R Recommendations: See Admitting Provider Note  Report given to:    Additional Notes:

## 2018-11-06 ENCOUNTER — Inpatient Hospital Stay: Payer: Medicare Other

## 2018-11-06 DIAGNOSIS — N179 Acute kidney failure, unspecified: Principal | ICD-10-CM

## 2018-11-06 DIAGNOSIS — R569 Unspecified convulsions: Secondary | ICD-10-CM

## 2018-11-06 LAB — BASIC METABOLIC PANEL
Anion gap: 13 (ref 5–15)
BUN: 82 mg/dL — ABNORMAL HIGH (ref 8–23)
CO2: 16 mmol/L — ABNORMAL LOW (ref 22–32)
Calcium: 7.9 mg/dL — ABNORMAL LOW (ref 8.9–10.3)
Chloride: 107 mmol/L (ref 98–111)
Creatinine, Ser: 5.25 mg/dL — ABNORMAL HIGH (ref 0.61–1.24)
GFR calc Af Amer: 10 mL/min — ABNORMAL LOW (ref >60)
GFR calc non Af Amer: 9 mL/min — ABNORMAL LOW (ref >60)
Glucose, Bld: 157 mg/dL — ABNORMAL HIGH (ref 70–99)
Potassium: 4.1 mmol/L (ref 3.5–5.1)
Sodium: 136 mmol/L (ref 135–145)

## 2018-11-06 LAB — CBC
HCT: 28.7 % — ABNORMAL LOW (ref 39.0–52.0)
Hemoglobin: 9.2 g/dL — ABNORMAL LOW (ref 13.0–17.0)
MCH: 27.6 pg (ref 26.0–34.0)
MCHC: 32.1 g/dL (ref 30.0–36.0)
MCV: 86.2 fL (ref 80.0–100.0)
Platelets: 115 10*3/uL — ABNORMAL LOW (ref 150–400)
RBC: 3.33 MIL/uL — ABNORMAL LOW (ref 4.22–5.81)
RDW: 16 % — ABNORMAL HIGH (ref 11.5–15.5)
WBC: 16.1 10*3/uL — ABNORMAL HIGH (ref 4.0–10.5)
nRBC: 0 % (ref 0.0–0.2)

## 2018-11-06 LAB — STREP PNEUMONIAE URINARY ANTIGEN: Strep Pneumo Urinary Antigen: NEGATIVE

## 2018-11-06 LAB — GLUCOSE, CAPILLARY
Glucose-Capillary: 111 mg/dL — ABNORMAL HIGH (ref 70–99)
Glucose-Capillary: 105 mg/dL — ABNORMAL HIGH (ref 70–99)
Glucose-Capillary: 89 mg/dL (ref 70–99)
Glucose-Capillary: 89 mg/dL (ref 70–99)

## 2018-11-06 MED ORDER — VANCOMYCIN HCL 10 G IV SOLR
2000.0000 mg | Freq: Once | INTRAVENOUS | Status: AC
  Administered 2018-11-06: 02:00:00 2000 mg via INTRAVENOUS
  Filled 2018-11-06: qty 2000

## 2018-11-06 MED ORDER — HEPARIN SODIUM (PORCINE) 5000 UNIT/ML IJ SOLN
5000.0000 [IU] | Freq: Two times a day (BID) | INTRAMUSCULAR | Status: DC
  Administered 2018-11-06 – 2018-11-09 (×7): 5000 [IU] via SUBCUTANEOUS
  Filled 2018-11-06 (×7): qty 1

## 2018-11-06 MED ORDER — SODIUM CHLORIDE 0.9 % IV SOLN
1.0000 g | INTRAVENOUS | Status: DC
  Administered 2018-11-06 – 2018-11-08 (×4): 1 g via INTRAVENOUS
  Filled 2018-11-06 (×3): qty 1

## 2018-11-06 MED ORDER — VANCOMYCIN VARIABLE DOSE PER UNSTABLE RENAL FUNCTION (PHARMACIST DOSING): Status: DC

## 2018-11-06 MED ORDER — SODIUM CHLORIDE 0.9 % IV SOLN
1.0000 g | Freq: Three times a day (TID) | INTRAVENOUS | Status: DC
  Filled 2018-11-06 (×2): qty 1

## 2018-11-06 NOTE — Consult Note (Signed)
11/06/2018 12:33 PM   Blake [name] October 04, 1922 [id number]  CC: Acute on chronic renal failure, bilateral hydroureteronephrosis  HPI: I was asked to see Blake [name] in consultation from Dr. Vianne Bulls for acute on chronic renal failure and bilateral hydroureteronephrosis.  He is a 85 year old co-morbid male with a complex urologic history including history of high-grade bladder cancer with known untreated tumor, history of prostate cancer, recurrent UTIs, and chronic bilateral hydroureteronephrosis.  He was previously followed by Dr. Jacqlyn Larsen at [facility], and has been followed by Dr. Bernardo Heater from [place].  His last cystoscopy was in June 2019 by Dr. Bernardo Heater and showed high-grade appearing bladder tumor at the left lateral wall and dome, TURBT was recommended.  His daughter, his healthcare power of attorney, deferred any intervention at that time secondary to his comorbidities and recurrent infections, and elected for observation.  He has had recurrent urinary infections with Pseudomonas and enterococcus, which have been resistant to multiple antibiotics.  He has been on Keflex low-dose prophylaxis.  His baseline creatinine is ~2.5, eGFR 25.  He is currently admitted to the hospitalist, after presenting to the emergency department yesterday with a seizure-like episode of shaking, confusion, and emesis.  He was found to have acute on chronic renal failure with a creatinine of 5.8, and eGFR of 10, with leukocytosis to 16k.  Urinalysis showed few bacteria, nitrite negative, 6-10 RBCs, and 20-50 WBCs, with WBC clumps.  Urine culture is pending.  Blood cultures no growth so far.  He has been afebrile and hemodynamically stable.  Aside from some scrotal pain, he denies any other complaint currently.  He denies flank pain or lower abdominal pain.  He does have some difficulty urinating and has some incontinence, especially overnight.  Bladder scan today for 225 cc.  CT scan today shows persistent  bilateral hydroureteronephrosis down to the bladder.  No obvious locally advanced bladder tumors or lymphadenopathy, though exam limited by lack of contrast.  There did not appear to be any ureteral stones.  Brachytherapy seeds in the prostate.     PMH: Past Medical History:  Diagnosis Date  . AAA (abdominal aortic aneurysm) ([place])   . Arrhythmia   . Diabetes mellitus without complication ([place])   . GI bleed   . Heart murmur   . Hyperlipidemia   . Hypertension   . Prostate cancer [facility])     Surgical History: Past Surgical History:  Procedure Laterality Date  . FLEXIBLE SIGMOIDOSCOPY N/A 10/21/2016   Procedure: FLEXIBLE SIGMOIDOSCOPY;  Surgeon: [name], MD;  Location: [facility] ENDOSCOPY;  Service: Endoscopy;  Laterality: N/A;  . JOINT REPLACEMENT    . PACEMAKER INSERTION Left 04/14/2015   Procedure: INSERTION PACEMAKER;  Surgeon: Isaias Cowman, MD;  Location: [facility] ORS;  Service: Cardiovascular;  Laterality: Left;  . PROSTATE SURGERY    . VISCERAL ARTERY INTERVENTION N/A 10/22/2016   Procedure: Visceral Artery Intervention;  Surgeon: Algernon Huxley, MD;  Location: [place] CV LAB;  Service: Cardiovascular;  Laterality: N/A;    Allergies:  Allergies  Allergen Reactions  . Diovan [Valsartan] Cough  . Gabapentin Other (See Comments)    Sedation at all doses  . Hydralazine Itching  . Lisinopril Cough  . Pregabalin Other (See Comments)    Sedation at all doses  . Levofloxacin Other (See Comments)    Too many side effects. Nausea, vomiting, upset stomach, increased confusion, etc Other reaction(s): Confusion Nausea, chills Nausea, chills   . Sulfamethoxazole-Trimethoprim Other (See Comments)    Too many  side effects. Nausea, vomiting, upset stomach, increased confusion, etc    Family History: Family History  Problem Relation Age of Onset  . Hypertension Other   . Stroke Other   . Heart attack Other   . Stroke Sister   . Heart attack Brother     Social History:   reports that he has quit smoking. He has never used smokeless tobacco. He reports that he does not drink alcohol or use drugs.  ROS: Please see flowsheet from today's date for complete review of systems.  Physical Exam: BP (!) 141/76 (BP Location: Left Arm)   Pulse 75   Temp 98.9 F (37.2 C) (Oral)   Resp 18   Ht '6\' 2"'$  (1.88 m)   Wt 98.8 kg   SpO2 99%   BMI 27.97 kg/m    Constitutional: Mildly confused Cardiovascular: No clubbing, cyanosis, or edema. Respiratory: Normal respiratory effort, no increased work of breathing. GI: Abdomen is soft, nontender, nondistended, no abdominal masses GU: No CVA tenderness, phallus without lesions, condom catheter in place Testicles moderately tender bilaterally, no superficial lesions or areas of induration Lymph: No cervical or inguinal lymphadenopathy. Skin: No rashes, bruises or suspicious lesions. Neurologic: Grossly intact, no focal deficits, moving all 4 extremities. Psychiatric: Normal mood and affect.  Laboratory Data: Reviewed, see HPI  Pertinent Imaging: See HPI  Assessment & Plan:   In summary, [name] is a 85 year old comorbid male with complex urologic history including history of prostate cancer treated with brachytherapy, high-grade bladder cancer with known residual bladder lesions since at least June 2019, and chronic hydroureteronephrosis since at least August 2019.  He is currently admitted after an episode of possible seizure with confusion, with urinalysis concerning for urinary tract infection, and CT scan showing chronic bilateral hydroureteronephrosis. He is asymptomatic currently, afebrile, and HDS.  I had a very long conversation with the patient's HCPOA, daughter Dr. Primus Bravo, [place] today.  We discussed at length his numerous comorbidities, acute on chronic renal failure, chronic hydroureteronephrosis with unclear etiology (possibilities include incomplete bladder emptying, ureteral stricture from radiation, locally  advanced bladder or prostate cancer) and urine suspicious for infection.  We discussed that standard management for an infected obstructed system would be urgent drainage with either Foley catheter, bilateral ureteral stents, or bilateral nephrostomy tubes.  We discussed the risks and benefits at length of these options, especially in the setting of his advanced age, frailty, and numerous co-morbidities.  Understandably, she would like to avoid any invasive procedures at this time if can be avoided.  I recommended starting with Foley catheter placement to rule out bladder outlet obstruction as the etiology of his bilateral hydroureteronephrosis, and continue antibiotics.  We discussed that with his known bladder tumors he could develop gross hematuria and clot retention.  Place 20 French Foley catheter Send PSA Continue antibiotics, follow-up urine and blood cultures Avoid nephrotoxic agents, trend serum creatinine Okay for diet today, NPO at midnight in case of need for procedure tomorrow  [name], MD  [facility] [address], [place], [place] [address] [phone]

## 2018-11-06 NOTE — Progress Notes (Signed)
eeg completed ° °

## 2018-11-06 NOTE — Progress Notes (Signed)
[place] [facility] [place] NAME: Blake [name]    MR#:  [id number]  DATE OF BIRTH:  [birth year]  SUBJECTIVE: Generalized weakness  CHIEF COMPLAINT:   Chief Complaint  Patient presents with  . Altered Mental Status    REVIEW OF SYSTEMS:   ROS CONSTITUTIONAL: No fever, fatigue or weakness.  EYES: No blurred or double vision.  EARS, NOSE, AND THROAT: No tinnitus or ear pain.  RESPIRATORY: No cough, shortness of breath, wheezing or hemoptysis.  CARDIOVASCULAR: No chest pain, orthopnea, edema.  GASTROINTESTINAL: No nausea, vomiting, diarrhea or abdominal pain.  GENITOURINARY: No dysuria, hematuria.  ENDOCRINE: No polyuria, nocturia,  HEMATOLOGY: No anemia, easy bruising or bleeding SKIN: No rash or lesion. MUSCULOSKELETAL: No joint pain or arthritis.   NEUROLOGIC: No tingling, numbness, weakness.  PSYCHIATRY: No anxiety or depression.   DRUG ALLERGIES:   Allergies  Allergen Reactions  . Diovan [Valsartan] Cough  . Gabapentin Other (See Comments)    Sedation at all doses  . Hydralazine Itching  . Lisinopril Cough  . Pregabalin Other (See Comments)    Sedation at all doses  . Levofloxacin Other (See Comments)    Too many side effects. Nausea, vomiting, upset stomach, increased confusion, etc Other reaction(s): Confusion Nausea, chills Nausea, chills   . Sulfamethoxazole-Trimethoprim Other (See Comments)    Too many side effects. Nausea, vomiting, upset stomach, increased confusion, etc    VITALS:  Blood pressure (!) 141/76, pulse 75, temperature 98.9 F (37.2 C), temperature source Oral, resp. rate 18, height 6\' 2"  (1.88 m), weight 98.8 kg, SpO2 99 %.  PHYSICAL EXAMINATION:  GENERAL:  85 y.o.-year-old patient lying in the bed with no acute distress.  EYES: Pupils equal, round, reactive to light and accommodation. No scleral icterus. Extraocular muscles intact.  HEENT: Head atraumatic, normocephalic. Oropharynx and  nasopharynx clear.  NECK:  Supple, no jugular venous distention. No thyroid enlargement, no tenderness.  LUNGS: Normal breath sounds bilaterally, no wheezing, rales,rhonchi or crepitation. No use of accessory muscles of respiration.  CARDIOVASCULAR: S1, S2 normal. No murmurs, rubs, or gallops.  ABDOMEN: Soft, nontender, nondistended. Bowel sounds present. No organomegaly or mass.  EXTREMITIES: No pedal edema, cyanosis, or clubbing.  NEUROLOGIC: Cranial nerves II through XII are intact. Muscle strength 5/5 in all extremities. Sensation intact. Gait not checked.  PSYCHIATRIC: The patient is alert and oriented x 3.  SKIN: No obvious rash, lesion, or ulcer.    LABORATORY PANEL:   CBC Recent Labs  Lab 11/06/18 0055  WBC 16.1*  HGB 9.2*  HCT 28.7*  PLT 115*   ------------------------------------------------------------------------------------------------------------------  Chemistries  Recent Labs  Lab 11/05/18 1530 11/06/18 0055  NA 134* 136  K 3.8 4.1  CL 102 107  CO2 16* 16*  GLUCOSE 180* 157*  BUN 86* 82*  CREATININE 5.81* 5.25*  CALCIUM 8.1* 7.9*  AST 16  --   ALT 15  --   ALKPHOS 75  --   BILITOT 0.8  --    ------------------------------------------------------------------------------------------------------------------  Cardiac Enzymes No results for input(s): TROPONINI in the last 168 hours. ------------------------------------------------------------------------------------------------------------------  RADIOLOGY:  Ct Head Wo Contrast  Result Date: 11/05/2018 CLINICAL DATA:  85 year old with possible seizure earlier today. Hypotension upon EMS arrival. Possibly postictal upon EMS arrival. EXAM: CT HEAD WITHOUT CONTRAST TECHNIQUE: Contiguous axial images were obtained from the base of the skull through the vertex without intravenous contrast. COMPARISON:  06/16/2014 and earlier. FINDINGS: Brain: Severe age related cortical, deep and cerebellar atrophy,  progressive since 2015. Moderate changes of small vessel disease of the white matter diffusely, also progressive. No mass lesion. No midline shift. No acute hemorrhage or hematoma. No extra-axial fluid collections. No evidence of acute infarction. Vascular: Severe BILATERAL carotid siphon and RIGHT vertebral artery atherosclerosis. No hyperdense vessel. Skull: No skull fracture or other focal osseous abnormality involving the skull. Sinuses/Orbits: Visualized paranasal sinuses, bilateral mastoid air cells and bilateral middle ear cavities well-aerated. Senile calcifications involving both globes. No significant abnormality involving the orbits or globes. Other: None. IMPRESSION: 1. No acute intracranial abnormality. 2. Severe age related generalized atrophy and moderate chronic microvascular ischemic changes of the white matter, progressive since 2015. Electronically Signed   By: Evangeline Dakin M.D.   On: 11/05/2018 16:07   US Renal  Result Date: 11/06/2018 CLINICAL DATA:  Acute renal insufficiency. EXAM: RENAL / URINARY TRACT ULTRASOUND COMPLETE COMPARISON:  CT 07/16/2016 FINDINGS: Right Kidney: Renal measurements: 12.5 x 6.6 x 6.2 cm = volume: 268 mL . Echogenicity within normal limits. No mass visualized. Moderate hydronephrosis which was also noted on previous CT. Left Kidney: Renal measurements: 13.7 x 7.2 x 6.1 cm = volume: 320 mL. Echogenicity within normal limits. No mass visualized. Moderate hydronephrosis which was also noted on previous CT. Bladder: Mild-to-moderately filled as patient unable to void. Patient also unable to move as there is mild echogenic material along the dependent portion of the bladder which may represent mild wall thickening versus echogenic dependent debris. IMPRESSION: Normal size kidneys with moderate bilateral chronic hydronephrosis. No focal mass or nephrolithiasis. Mild echogenic material along the dependent portion of the bladder which may be due to wall thickening  versus dependent debris. Electronically Signed   By: Marin Olp M.D.   On: 11/06/2018 10:04   Dg Chest Port 1 View  Result Date: 11/05/2018 CLINICAL DATA:  85 year old with a seizure-like episode earlier today and now has excessive oral secretions. EXAM: PORTABLE CHEST 1 VIEW COMPARISON:  09/10/2017 and earlier. FINDINGS: Cardiac silhouette mildly enlarged for AP portable technique, unchanged. LEFT subclavian dual lead transvenous pacemaker unchanged. Thoracic aorta atherosclerotic, unchanged. Hilar and mediastinal contours otherwise unremarkable. Airspace consolidation involving the RIGHT lung base. Linear atelectasis involving the LEFT lung base. No pleural effusions. Pulmonary vascularity normal without evidence of pulmonary edema. IMPRESSION: 1. Acute pneumonia involving the RIGHT lung base. 2. Linear atelectasis involving the LEFT lung base. 3. Stable mild cardiomegaly without pulmonary edema. Electronically Signed   By: Evangeline Dakin M.D.   On: 11/05/2018 21:07    EKG:   Orders placed or performed during the hospital encounter of 11/05/18  . EKG 12-Lead  . EKG 12-Lead    ASSESSMENT AND PLAN:   85 year old male patient with history of hypertension, atrial fibrillation, permanent pacemaker, hyperlipidemia, diabetes, prostate cancer came in because of episode of shaking in the emergency room.  There was a question about possible seizure. #1 .possible seizure precipitated by UTI, renal failure: Patient cannot have MRI because of pacemaker.  Seen by neurology, follow EEG, depending upon the results patient will be either started on AED or not 2.   Acute renal failure with CKD stage III, creatinine of 2.8 in PCP office in January 2020.  Acute renal failure likely due to dehydration, improving with IV fluids. continue IV fluids for today and see how he does.  Renal ultrasound showed moderate bilateral chronic hydronephrosis, normal-sized kidneys no focal mass.  Bladder wall  thickening/thicekning  Because of acute worsening of renal failure will consult urology. 3.  History of prostate cancer, patient is on Proscar 4.  UTI,; started on cefepime.  Patient has no neurological deficit at this time.  Seen by neurology, follow EEG, continue IV hydration, check renal function, After EEG will get physical therapy consult.  All the records are reviewed and case discussed with Care Management/Social Workerr. Management plans discussed with the patient, family and they are in agreement. More than 50% time spent in counseling and coordination of care. CODE STATUS: full  TOTAL TIME TAKING CARE OF THIS PATIENT: 38 minutes.   POSSIBLE D/C IN 2-3 DAYS, DEPENDING ON CLINICAL CONDITION.   Epifanio Lesches M.D on 11/06/2018 at 11:37 AM  Between 7am to 6pm - Pager - [phone]  After 6pm go to [email or web address] - password [email or web address] [facility]  Office  [phone]  CC: Primary care physician; Adin Hector, MD   Note: This dictation was prepared with Dragon dictation along with smaller phrase technology. Any transcriptional errors that result from this process are unintentional.

## 2018-11-06 NOTE — Progress Notes (Signed)
Pharmacy Antibiotic Note  Blake [name] is a 85 y.o. male admitted on 11/05/2018 with pneumonia.  Pharmacy has been consulted for vanc/cefepime dosing. Patient admitted w/ AMS w/ h/o CKD III currently AKI on CKD (baseline Scr 2.2 - 2.5).  Plan: Patient received vanc 2g IV load on 4/27 at 0141 Considering patient is in AKI on CKD will dose based on random levels. Current SCr suggests Ke 0.013, half life 53 h, Vd 71 L; expect level to be ~20 in 26 hours.  Will check a random vancomycin level and SCr with AM labs.  Will re-dose if random level is < 20 mcg/mL Will order MRSA PCR to help guide therapy Will continue monitoring renal function and will adjust doses if necessary.  Height: 6\' 2"  (188 cm) Weight: 217 lb 13 oz (98.8 kg) IBW/kg (Calculated) : 82.2  Temp (24hrs), Avg:98.3 F (36.8 C), Min:97.8 F (36.6 C), Max:98.9 F (37.2 C)  Recent Labs  Lab 11/05/18 1530 11/06/18 0055  WBC 10.6* 16.1*  CREATININE 5.81* 5.25*    Estimated Creatinine Clearance: 11 mL/min (A) (by C-G formula based on SCr of 5.25 mg/dL (H)).    Allergies  Allergen Reactions  . Diovan [Valsartan] Cough  . Gabapentin Other (See Comments)    Sedation at all doses  . Hydralazine Itching  . Lisinopril Cough  . Pregabalin Other (See Comments)    Sedation at all doses  . Levofloxacin Other (See Comments)    Too many side effects. Nausea, vomiting, upset stomach, increased confusion, etc Other reaction(s): Confusion Nausea, chills Nausea, chills   . Sulfamethoxazole-Trimethoprim Other (See Comments)    Too many side effects. Nausea, vomiting, upset stomach, increased confusion, etc   BCx x2 collected MRSA PCR ordered  Thank you for allowing pharmacy to be a part of this patient's care.  Rayna Sexton, PharmD, BCPS Clinical Pharmacist 11/06/2018 8:24 AM

## 2018-11-06 NOTE — Procedures (Signed)
  [facility] [place] A. Merlene Laughter, MD     [email or web address]           HISTORY: This is a 85 year old man who presents with an episode of convulsion suspicious for epileptic seizures.  MEDICATIONS:  Current Facility-Administered Medications:  .  0.9 %  sodium chloride infusion, , Intravenous, Continuous, Mayo, Pete Pelt, MD, Last Rate: 75 mL/hr at 11/06/18 1145 .  acetaminophen (TYLENOL) tablet 650 mg, 650 mg, Oral, Q6H PRN **OR** acetaminophen (TYLENOL) suppository 650 mg, 650 mg, Rectal, Q6H PRN, Mayo, Pete Pelt, MD .  ceFEPIme (MAXIPIME) 1 g in sodium chloride 0.9 % 100 mL IVPB, 1 g, Intravenous, Q24H, Mayo, Pete Pelt, MD, Last Rate: 200 mL/hr at 11/06/18 0058, 1 g at 11/06/18 0058 .  famotidine (PEPCID) tablet 20 mg, 20 mg, Oral, Daily, Mayo, Pete Pelt, MD, 20 mg at 11/06/18 0947 .  finasteride (PROSCAR) tablet 5 mg, 5 mg, Oral, Daily, Mayo, Pete Pelt, MD, 5 mg at 11/06/18 0947 .  heparin injection 5,000 Units, 5,000 Units, Subcutaneous, Q12H, Rocky Morel, RPH, 5,000 Units at 11/06/18 0948 .  insulin aspart (novoLOG) injection 0-5 Units, 0-5 Units, Subcutaneous, QHS, Mayo, Pete Pelt, MD .  insulin aspart (novoLOG) injection 0-9 Units, 0-9 Units, Subcutaneous, TID WC, Mayo, Pete Pelt, MD .  LORazepam (ATIVAN) injection 1 mg, 1 mg, Intravenous, Q4H PRN, Mayo, Pete Pelt, MD .  metoprolol tartrate (LOPRESSOR) tablet 37.5 mg, 37.5 mg, Oral, BID, Mayo, Pete Pelt, MD, 37.5 mg at 11/06/18 0946 .  ondansetron (ZOFRAN) tablet 4 mg, 4 mg, Oral, Q6H PRN **OR** ondansetron (ZOFRAN) injection 4 mg, 4 mg, Intravenous, Q6H PRN, Mayo, Pete Pelt, MD .  pantoprazole (PROTONIX) EC tablet 40 mg, 40 mg, Oral, Daily, Mayo, Pete Pelt, MD, 40 mg at 11/06/18 0947 .  polyethylene glycol (MIRALAX / GLYCOLAX) packet 17 g, 17 g, Oral, Daily PRN, Mayo, Pete Pelt, MD .  rosuvastatin (CRESTOR) tablet 5 mg, 5 mg, Oral, QHS, Mayo, Pete Pelt, MD, 5 mg at 11/05/18 2039 .  sodium bicarbonate tablet 650 mg, 650  mg, Oral, BID, Mayo, Pete Pelt, MD, 650 mg at 11/05/18 2039 .  tamsulosin (FLOMAX) capsule 0.4 mg, 0.4 mg, Oral, BID, Mayo, Pete Pelt, MD, 0.4 mg at 11/06/18 0947 .  vancomycin variable dose per unstable renal function (pharmacist dosing), , Does not apply, See admin instructions, Mayo, Pete Pelt, MD     ANALYSIS: A 16 channel recording using standard 10 20 measurements is conducted for 21 minutes.  There is a well-formed posterior dominant rhythm of 10 Hz which attenuates with eye opening.  There is beta activity observed in frontal areas.  Awake and drowsy activities are observed.  Photic stimulation is carried out without abnormal changes in the background activity.  There is a single episode with a small sharp wave generalized activity thought to be a normal variant and not epileptic.  Focal and lateral slowing are not observed.  There is no clear epileptiform activity is observed.   IMPRESSION: 1.  This is a normal recording awake and drowsy states.      Ayven Glasco A. Merlene Laughter, M.D.  Diplomate, Tax adviser of Psychiatry and Neurology ( Neurology).

## 2018-11-06 NOTE — Consult Note (Signed)
Reason for Consult: shaking  Referring Physician: Dr. Leslye Peer   CC: shaking   HPI: Blake [name] is an 85 y.o. male  known history of hypertension, Aifb, PPM hyperlipidemia, type 2 diabetes, hx of prostate cancer who presented to the ED with a shaking episode yesterday.  Last morning he had an episode of generalized shaking of all of his limbs that lasted 10 to 15 seconds and then resolved on its own.  It sounds like he was confused after the episode.  He did have one episode of vomiting.  Patient states that he felt fine immediately prior to the episode.  Apparently this has happened twice in the past, in 2006 and 2014.  Patient has never been on antiepileptic medicines before.  He was recently treated for UTI as an outpatient.  He does have recurrent UTIs.  He denies any recent dysuria, urinary urgency, urinary frequency, fevers, or chills.  Denies any chest pain or palpitations.   Past Medical History:  Diagnosis Date  . AAA (abdominal aortic aneurysm) ([place])   . Arrhythmia   . Diabetes mellitus without complication ([place])   . GI bleed   . Heart murmur   . Hyperlipidemia   . Hypertension   . Prostate cancer [facility])     Past Surgical History:  Procedure Laterality Date  . FLEXIBLE SIGMOIDOSCOPY N/A 10/21/2016   Procedure: FLEXIBLE SIGMOIDOSCOPY;  Surgeon: [name], MD;  Location: [facility] ENDOSCOPY;  Service: Endoscopy;  Laterality: N/A;  . JOINT REPLACEMENT    . PACEMAKER INSERTION Left 04/14/2015   Procedure: INSERTION PACEMAKER;  Surgeon: Isaias Cowman, MD;  Location: [facility] ORS;  Service: Cardiovascular;  Laterality: Left;  . PROSTATE SURGERY    . VISCERAL ARTERY INTERVENTION N/A 10/22/2016   Procedure: Visceral Artery Intervention;  Surgeon: Algernon Huxley, MD;  Location: [place] CV LAB;  Service: Cardiovascular;  Laterality: N/A;    Family History  Problem Relation Age of Onset  . Hypertension Other   . Stroke Other   . Heart attack Other   . Stroke Sister   . Heart  attack Brother     Social History:  reports that he has quit smoking. He has never used smokeless tobacco. He reports that he does not drink alcohol or use drugs.  Allergies  Allergen Reactions  . Diovan [Valsartan] Cough  . Gabapentin Other (See Comments)    Sedation at all doses  . Hydralazine Itching  . Lisinopril Cough  . Pregabalin Other (See Comments)    Sedation at all doses  . Levofloxacin Other (See Comments)    Too many side effects. Nausea, vomiting, upset stomach, increased confusion, etc Other reaction(s): Confusion Nausea, chills Nausea, chills   . Sulfamethoxazole-Trimethoprim Other (See Comments)    Too many side effects. Nausea, vomiting, upset stomach, increased confusion, etc    Medications: I have reviewed the patient's current medications.  ROS: Unable to obtain due to confusion   Physical Examination: Blood pressure (!) 141/76, pulse 75, temperature 98.9 F (37.2 C), temperature source Oral, resp. rate 18, height 6\' 2"  (1.88 m), weight 98.8 kg, SpO2 99 %.    Neurological Examination   Mental Status: Alert to name only  Cranial Nerves: II: Discs flat bilaterally; Visual fields grossly normal, pupils equal, round, reactive to light and accommodation III,IV, VI: ptosis not present, extra-ocular motions intact bilaterally V,VII: smile symmetric, facial light touch sensation normal bilaterally VIII: hearing normal bilaterally IX,X: gag reflex present XI: bilateral shoulder shrug XII: midline tongue extension Motor: Generalized  weakness bilaterally  Tone and bulk:normal tone throughout; no atrophy noted Sensory: Pinprick and light touch intact throughout, bilaterally Deep Tendon Reflexes: 1+ and symmetric throughout Plantars: Right: downgoing   Left: downgoing Cerebellar: Not tested  Gait: not tested      Laboratory Studies:   Basic Metabolic Panel: Recent Labs  Lab 11/05/18 1530 11/06/18 0055  NA 134* 136  K 3.8 4.1  CL 102 107   CO2 16* 16*  GLUCOSE 180* 157*  BUN 86* 82*  CREATININE 5.81* 5.25*  CALCIUM 8.1* 7.9*    Liver Function Tests: Recent Labs  Lab 11/05/18 1530  AST 16  ALT 15  ALKPHOS 75  BILITOT 0.8  PROT 7.5  ALBUMIN 3.0*   No results for input(s): LIPASE, AMYLASE in the last 168 hours. No results for input(s): AMMONIA in the last 168 hours.  CBC: Recent Labs  Lab 11/05/18 1530 11/06/18 0055  WBC 10.6* 16.1*  NEUTROABS 8.9*  --   HGB 9.7* 9.2*  HCT 30.3* 28.7*  MCV 85.8 86.2  PLT 116* 115*    Cardiac Enzymes: Recent Labs  Lab 11/05/18 1652  CKTOTAL 45*    BNP: Invalid input(s): POCBNP  CBG: Recent Labs  Lab 11/05/18 2016 11/06/18 0733  GLUCAP 152* 111*    Microbiology: Results for orders placed or performed during the hospital encounter of 11/05/18  Culture, blood (routine x 2) Call MD if unable to obtain prior to antibiotics being given     Status: None (Preliminary result)   Collection Time: 11/06/18 12:46 AM  Result Value Ref Range Status   Specimen Description BLOOD LEFT ANTECUBITAL  Final   Special Requests   Final    BOTTLES DRAWN AEROBIC AND ANAEROBIC Blood Culture adequate volume   Culture   Final    NO GROWTH < 12 HOURS Performed at [facility], [address] [place], [place] [address]    Report Status PENDING  Incomplete  Culture, blood (routine x 2) Call MD if unable to obtain prior to antibiotics being given     Status: None (Preliminary result)   Collection Time: 11/06/18 12:52 AM  Result Value Ref Range Status   Specimen Description BLOOD LEFT FOREARM  Final   Special Requests   Final    BOTTLES DRAWN AEROBIC AND ANAEROBIC Blood Culture adequate volume   Culture   Final    NO GROWTH < 12 HOURS Performed at [facility], [address] [place], [place] [address]    Report Status PENDING  Incomplete    Coagulation Studies: No results for input(s): LABPROT, INR in the last 72 hours.  Urinalysis:  Recent Labs   Lab 11/05/18 2027  COLORURINE YELLOW*  LABSPEC 1.009  PHURINE 5.0  GLUCOSEU NEGATIVE  HGBUR LARGE*  BILIRUBINUR NEGATIVE  KETONESUR NEGATIVE  PROTEINUR NEGATIVE  NITRITE NEGATIVE  LEUKOCYTESUR LARGE*    Lipid Panel:  No results found for: CHOL, TRIG, HDL, CHOLHDL, VLDL, LDLCALC  HgbA1C:  Lab Results  Component Value Date   HGBA1C 6.9 (H) 04/01/2017    Urine Drug Screen:      Component Value Date/Time   LABOPIA NONE DETECTED 11/05/2018 2027   COCAINSCRNUR NONE DETECTED 11/05/2018 2027   LABBENZ NONE DETECTED 11/05/2018 2027   AMPHETMU NONE DETECTED 11/05/2018 2027   THCU NONE DETECTED 11/05/2018 2027   LABBARB NONE DETECTED 11/05/2018 2027    Alcohol Level: No results for input(s): ETH in the last 168 hours.  Other results: EKG: irreg, irreg in the 70s.  Imaging: Ct Head Wo Contrast  Result Date: 11/05/2018 CLINICAL DATA:  85 year old with possible seizure earlier today. Hypotension upon EMS arrival. Possibly postictal upon EMS arrival. EXAM: CT HEAD WITHOUT CONTRAST TECHNIQUE: Contiguous axial images were obtained from the base of the skull through the vertex without intravenous contrast. COMPARISON:  06/16/2014 and earlier. FINDINGS: Brain: Severe age related cortical, deep and cerebellar atrophy, progressive since 38. Moderate changes of small vessel disease of the white matter diffusely, also progressive. No mass lesion. No midline shift. No acute hemorrhage or hematoma. No extra-axial fluid collections. No evidence of acute infarction. Vascular: Severe BILATERAL carotid siphon and RIGHT vertebral artery atherosclerosis. No hyperdense vessel. Skull: No skull fracture or other focal osseous abnormality involving the skull. Sinuses/Orbits: Visualized paranasal sinuses, bilateral mastoid air cells and bilateral middle ear cavities well-aerated. Senile calcifications involving both globes. No significant abnormality involving the orbits or globes. Other: None. IMPRESSION:  1. No acute intracranial abnormality. 2. Severe age related generalized atrophy and moderate chronic microvascular ischemic changes of the white matter, progressive since 2015. Electronically Signed   By: Evangeline Dakin M.D.   On: 11/05/2018 16:07   US Renal  Result Date: 11/06/2018 CLINICAL DATA:  Acute renal insufficiency. EXAM: RENAL / URINARY TRACT ULTRASOUND COMPLETE COMPARISON:  CT 07/16/2016 FINDINGS: Right Kidney: Renal measurements: 12.5 x 6.6 x 6.2 cm = volume: 268 mL . Echogenicity within normal limits. No mass visualized. Moderate hydronephrosis which was also noted on previous CT. Left Kidney: Renal measurements: 13.7 x 7.2 x 6.1 cm = volume: 320 mL. Echogenicity within normal limits. No mass visualized. Moderate hydronephrosis which was also noted on previous CT. Bladder: Mild-to-moderately filled as patient unable to void. Patient also unable to move as there is mild echogenic material along the dependent portion of the bladder which may represent mild wall thickening versus echogenic dependent debris. IMPRESSION: Normal size kidneys with moderate bilateral chronic hydronephrosis. No focal mass or nephrolithiasis. Mild echogenic material along the dependent portion of the bladder which may be due to wall thickening versus dependent debris. Electronically Signed   By: Marin Olp M.D.   On: 11/06/2018 10:04   Dg Chest Port 1 View  Result Date: 11/05/2018 CLINICAL DATA:  85 year old with a seizure-like episode earlier today and now has excessive oral secretions. EXAM: PORTABLE CHEST 1 VIEW COMPARISON:  09/10/2017 and earlier. FINDINGS: Cardiac silhouette mildly enlarged for AP portable technique, unchanged. LEFT subclavian dual lead transvenous pacemaker unchanged. Thoracic aorta atherosclerotic, unchanged. Hilar and mediastinal contours otherwise unremarkable. Airspace consolidation involving the RIGHT lung base. Linear atelectasis involving the LEFT lung base. No pleural effusions.  Pulmonary vascularity normal without evidence of pulmonary edema. IMPRESSION: 1. Acute pneumonia involving the RIGHT lung base. 2. Linear atelectasis involving the LEFT lung base. 3. Stable mild cardiomegaly without pulmonary edema. Electronically Signed   By: Evangeline Dakin M.D.   On: 11/05/2018 21:07     Assessment/Plan:  86 y.o. male  known history of hypertension, Aifb, PPM hyperlipidemia, type 2 diabetes, hx of prostate cancer who presented to the ED with a shaking episode yesterday.  Last morning he had an episode of generalized shaking of all of his limbs that lasted 10 to 15 seconds and then resolved on its own.  It sounds like he was confused after the episode.  He did have one episode of vomiting.  Patient states that he felt fine immediately prior to the episode.  Apparently this has happened twice in the past, in 2006 and 2014.  Patient  has never been on antiepileptic medicines before.  He was recently treated for UTI as an outpatient.  He does have recurrent UTIs.  He denies any recent dysuria, urinary urgency, urinary frequency, fevers, or chills.  Denies any chest pain or palpitations.  - Possibly seizure activity that has been provoked by UTI's - Unable to obtain MRI brain as has PPM - Agree with EEG to be done. If sharps then potentially will consider Anti epileptics otherwise no AED use.  - Pt does not drive - CTH lots of generalized atrophy. Nothing acute.  - Possibly d/c from Neuro stand point after EEG.  11/06/2018, 10:50 AM

## 2018-11-06 NOTE — Progress Notes (Addendum)
Anticoagulation monitoring  Pt currently ordered enoxaparin 30 mg q24h - dose not yet given CrCl 11 ml/min Per policy, for CrCl <73 ml/min will convert to SQ heparin 5000 units q12h

## 2018-11-06 NOTE — TOC Initial Note (Signed)
Transition of Care [facility]) - Initial/Assessment Note    Patient Details  Name: Blake [name] MRN: [id number] Date of Birth: 02-Jun-1923  Transition of [facility]) CM/SW Contact:    Shade Flood, LCSW Phone Number: 11/06/2018, 11:35 AM  Clinical Narrative:                  Reviewed pt's record today. From the record, it appears pt resides with his daughter in [place]. Pt being treated for kidney injury, seizure activity and pneumonia. Anticipating pt will have PT eval once his condition improves. TOC will follow and watch for dc planning needs.  Expected Discharge Plan: [place] Barriers to Discharge: Continued Medical Work up   Patient Goals and CMS Choice        Expected Discharge Plan and Services Expected Discharge Plan: [place]       Living arrangements for the past 2 months: Single Family Home Expected Discharge Date: 11/08/18                                    Prior Living Arrangements/Services Living arrangements for the past 2 months: Single Family Home Lives with:: Adult Children Patient language and need for interpreter reviewed:: Yes Do you feel safe going back to the place where you live?: Yes      Need for Family Participation in Patient Care: Yes (Comment) Care giver support system in place?: Yes (comment)   Criminal Activity/Legal Involvement Pertinent to Current Situation/Hospitalization: No - Comment as needed  Activities of Daily Living Home Assistive Devices/Equipment: Dentures (specify type), Grab bars in shower, Shower chair with back, Wheelchair ADL Screening (condition at time of admission) Patient's cognitive ability adequate to safely complete daily activities?: No Is the patient deaf or have difficulty hearing?: No Does the patient have difficulty seeing, even when wearing glasses/contacts?: No Does the patient have difficulty concentrating, remembering, or making decisions?: Yes Patient able  to express need for assistance with ADLs?: No Does the patient have difficulty dressing or bathing?: Yes Independently performs ADLs?: No Communication: Dependent Is this a change from baseline?: Pre-admission baseline Dressing (OT): Dependent Is this a change from baseline?: Pre-admission baseline Grooming: Dependent Is this a change from baseline?: Pre-admission baseline Feeding: Dependent Is this a change from baseline?: Pre-admission baseline Bathing: Dependent Is this a change from baseline?: Pre-admission baseline Toileting: Dependent Is this a change from baseline?: Pre-admission baseline In/Out Bed: Dependent Is this a change from baseline?: Pre-admission baseline Walks in Home: Dependent Is this a change from baseline?: Pre-admission baseline Does the patient have difficulty walking or climbing stairs?: Yes Weakness of Legs: Both Weakness of Arms/Hands: Both  Permission Sought/Granted                  Emotional Assessment Appearance:: Appears stated age     Orientation: : Oriented to Self, Oriented to Place Alcohol / Substance Use: Not Applicable Psych Involvement: No (comment)  Admission diagnosis:  Weakness [R53.1] AKI (acute kidney injury) ([place]) [N17.9] Seizure-like activity ([place]) [R56.9] Patient Active Problem List   Diagnosis Date Noted  . Seizure-like activity ([place]) 11/05/2018  . Allergic rhinitis 07/24/2018  . Cardiac pacemaker in situ 07/24/2018  . History of sleep apnea 07/24/2018  . Macular degeneration 07/24/2018  . Trigeminal neuralgia pain 01/11/2018  . TMJ pain dysfunction syndrome 01/11/2018  . Recurrent UTI 09/26/2017  . 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 Pressure injury of skin 09/12/2017  . SIRS (systemic inflammatory response syndrome) ([place]) 09/10/2017  . Chronic cystitis 08/25/2017  . Sepsis ([place]) 06/10/2017  . CAP (community acquired pneumonia) 06/10/2017  . Infection due to enterococcus 06/07/2017  . UTI  (urinary tract infection) 05/17/2017  . Blood in stool   . Hemorrhage of rectum and anus   . Lower GI bleed 10/20/2016  . Near syncope   . Acute renal failure superimposed on chronic kidney disease ([place])   . Pneumonia 10/09/2016  . Aspiration pneumonia ([place]) 10/09/2016  . Acute respiratory failure ([place]) 10/09/2016  . Hydronephrosis, bilateral 07/19/2016  . Spinal stenosis of lumbar region with neurogenic claudication 07/19/2016  . AAA (abdominal aortic aneurysm) without rupture ([place]) 04/27/2016  . Arthritis, degenerative 03/31/2016  . A-fib ([place]) 03/31/2016  . HLD (hyperlipidemia) 03/31/2016  . CA of prostate ([place]) 03/31/2016  . Central perforation of tympanic membrane of right ear 03/26/2016  . 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 Presbycusis of left ear with restricted hearing of right ear 03/01/2016  . Chronic kidney disease, stage 3 (moderate) ([place]) 10/30/2015  . Episode of unresponsiveness 10/30/2015  . Sick sinus syndrome ([place]) 10/30/2015  . Malignant neoplasm of overlapping sites of bladder ([place]) 09/26/2015  . Urinary retention 09/17/2015  . Bladder neoplasm of uncertain malignant potential 08/13/2015  . Thrombocytopenia, unspecified ([place]) 07/24/2015  . Fitting or adjustment of cardiac pacemaker 05/09/2015  . SVT (supraventricular tachycardia) ([place]) 04/09/2015  . Diabetes mellitus, type 2 ([place]) 04/09/2015  . Benign hypertension 04/09/2015  . Acute myocardial infarction, initial episode of care ([place]) 04/09/2015  . Phimosis 01/30/2014  . Bladder outlet obstruction 04/18/2013  . Dysuria 04/18/2013  . Gross hematuria 04/18/2013  . Urethral valve, acquired 04/18/2013  . Incomplete emptying of bladder 11/26/2012  . Personal history of prostate cancer 11/26/2012   PCP:  Adin Hector, MD Pharmacy:   [facility], [place]  [address] Phone: [phone] Fax: [place], [place] - [address] [place] [address] Phone: [phone] Fax: [phone]  [place], [place] - [place] [address] Phone: [phone] Fax: [phone]  [id number] [address], [place] [address] Phone: [phone] Fax: [phone]     Social Determinants of Health (SDOH) Interventions    Readmission Risk Interventions No flowsheet data found.

## 2018-11-06 NOTE — Progress Notes (Signed)
Discussed with urologist about renal failure, bilateral hydronephrosis, spoke with Dr. Chrystine Oiler From urology, recommended n.p.o., CT of abdomen pelvis without contrast, bladder scan, he will see the patient.  Urology consult placed.

## 2018-11-06 NOTE — Evaluation (Signed)
Physical Therapy Evaluation Patient Details Name: Blake [name] MRN: [id number] DOB: 1923/03/19 Today's Date: 11/06/2018   History of Present Illness  Blake [name] is a 85yo male who comes to [facility] on 4/26 c AMS after a 10-15 minute shaking episode at home, also an episode of emesis. PMH: HTN, HLD, DM2, PrCA, bladderCA c frequent UTI. Pt admieeted with seizure activity, also seen to have UTI. Pt is familiar to author from prior admissions.   Clinical Impression  Pt admitted with above diagnosis. Pt currently with functional limitations due to the deficits listed below (see "PT Problem List"). Upon entry, pt in bed, asleep, but easily awakened and agreeable to participate. The pt is alert and oriented x4, pleasant, conversational, and generally a good historian, but mildly confused at times.Pt requires heavy physical assistance to EOB and to stand, but once up, is able to maintain standing at supervision level with RW x 2 minutes. AMB deferred d/t incontinence episode. Functional mobility assessment demonstrates increased effort/time requirements, poor tolerance, and need for physical assistance, whereas the patient performed these at a higher level of independence PTA. Pt has extensive DME in the home to meet his variable needs and has 24/7 caregiver support. Pt will benefit from skilled PT intervention to increase independence and safety with basic mobility in preparation for discharge to the venue listed below.       Follow Up Recommendations Home health PT;Supervision for mobility/OOB    Equipment Recommendations  None recommended by PT    Recommendations for Other Services       Precautions / Restrictions Precautions Precautions: Fall Precaution Comments: seizure  Restrictions Weight Bearing Restrictions: No      Mobility  Bed Mobility Overal bed mobility: Needs Assistance Bed Mobility: Supine to Sit;Sit to Supine     Supine to sit: Mod assist Sit to supine: Min guard    General bed mobility comments: pt reports as similar to baseline  Transfers Overall transfer level: Needs assistance Equipment used: Rolling walker (2 wheeled) Transfers: Sit to/from Stand Sit to Stand: Mod assist         General transfer comment: pt reports as similar to baseline, but weaker; able to stand with RW at supervision for 2 minutes  Ambulation/Gait Ambulation/Gait assistance: (deferred at eval, as condom catheter came off and pt is incontinent on floor)              Stairs            Wheelchair Mobility    Modified Rankin (Stroke Patients Only)       Balance Overall balance assessment: Modified Independent                                           Pertinent Vitals/Pain Pain Assessment: No/denies pain    Home Living Family/patient expects to be discharged to:: Private residence Living Arrangements: Spouse/significant other;Children Available Help at Discharge: Family;Available 24 hours/day;Personal care attendant Type of Home: House Home Access: Ramped entrance     Home Layout: Multi-level;Able to live on main level with bedroom/bathroom Home Equipment: Walker - 4 wheels;Cane - single point;Bedside commode;Shower seat - built in;Wheelchair - manual      Prior Function Level of Independence: Needs assistance   Gait / Transfers Assistance Needed: limited household ambulator with Rollator  ADL's / Homemaking Assistance Needed: reports independent with dressing, toiletting, bathing unless he's in a hurry  Hand Dominance   Dominant Hand: Right    Extremity/Trunk Assessment   Upper Extremity Assessment Upper Extremity Assessment: Generalized weakness    Lower Extremity Assessment Lower Extremity Assessment: Generalized weakness       Communication      Cognition Arousal/Alertness: Lethargic(asleep upon entry) Behavior During Therapy: WFL for tasks assessed/performed Overall Cognitive Status: Within  Functional Limits for tasks assessed                                 General Comments: slightly confused      General Comments      Exercises     Assessment/Plan    PT Assessment Patient needs continued PT services  PT Problem List Decreased strength;Decreased mobility;Decreased activity tolerance;Decreased cognition       PT Treatment Interventions DME instruction;Gait training;Therapeutic activities;Functional mobility training;Patient/family education    PT Goals (Current goals can be found in the Care Plan section)  Acute Rehab PT Goals Patient Stated Goal: return to home, regain strength  PT Goal Formulation: With patient Time For Goal Achievement: 11/20/18 Potential to Achieve Goals: Good    Frequency Min 2X/week   Barriers to discharge        Co-evaluation               AM-PAC PT "6 Clicks" Mobility  Outcome Measure Help needed turning from your back to your side while in a flat bed without using bedrails?: A Little Help needed moving from lying on your back to sitting on the side of a flat bed without using bedrails?: A Lot Help needed moving to and from a bed to a chair (including a wheelchair)?: A Lot Help needed standing up from a chair using your arms (e.g., wheelchair or bedside chair)?: A Lot Help needed to walk in hospital room?: A Lot Help needed climbing 3-5 steps with a railing? : Total 6 Click Score: 12    End of Session   Activity Tolerance: Patient tolerated treatment well;Patient limited by lethargy;Other (comment)(urinary incontinence with catherter malfunction) Patient left: in bed;with bed alarm set;Other (comment)(blankets over rails) Nurse Communication: Mobility status;Other (comment)(catheter failure) PT Visit Diagnosis: Muscle weakness (generalized) (M62.81);Difficulty in walking, not elsewhere classified (R26.2);Other abnormalities of gait and mobility (R26.89)    Time: 4076-8088 PT Time Calculation (min) (ACUTE  ONLY): 31 min   Charges:   PT Evaluation $PT Eval Moderate Complexity: 1 Mod PT Treatments $Therapeutic Exercise: 8-22 mins        11:42 AM, 11/06/18 Etta Grandchild, PT, DPT Physical Therapist - [facility]  [phone] ([place])    [name],[name] 11/06/2018, 11:40 AM

## 2018-11-06 NOTE — Progress Notes (Signed)
NP notified of pt increased RR , increased secretions and abnormal lung sounds. Orders for duoneb and CXR. CXR reveals RLL pneumonia and LLL atelectasis. NP notified and received orders for blood cultures, sputumn cultures and IV antibiotics. Pt remains afebrile, no c/o SOB and lung sounds improved after duoneb. Will Continue to monitor.

## 2018-11-06 NOTE — Progress Notes (Signed)
Pharmacy Antibiotic Note  Blake [name] is a 85 y.o. male admitted on 11/05/2018 with pneumonia.  Pharmacy has been consulted for vanc/cefepime dosing. Patient admitted w/ AMS w/ h/o CKD III currently AKI on CKD (baseline Scr 2.2 - 2.5).  Plan: Patient received vanc 2g IV load  Considering patient is in AKI on CKD will dose based on random levels. Will check a random level 12 hours post dose. Goal random < 20 mcg/mL Will re-dose 15 mg/kg if random level is < 20 mcg/mL Will start cefepime 1g IV q24h per CrCl < 11 ml/min Will continue monitoring renal function and will adjust doses if necessary.  Height: 6\' 2"  (188 cm) Weight: 217 lb 13 oz (98.8 kg) IBW/kg (Calculated) : 82.2  Temp (24hrs), Avg:97.9 F (36.6 C), Min:97.8 F (36.6 C), Max:97.9 F (36.6 C)  Recent Labs  Lab 11/05/18 1530  WBC 10.6*  CREATININE 5.81*    Estimated Creatinine Clearance: 10 mL/min (A) (by C-G formula based on SCr of 5.81 mg/dL (H)).    Allergies  Allergen Reactions  . Diovan [Valsartan] Cough  . Gabapentin Other (See Comments)    Sedation at all doses  . Hydralazine Itching  . Lisinopril Cough  . Pregabalin Other (See Comments)    Sedation at all doses  . Levofloxacin Other (See Comments)    Too many side effects. Nausea, vomiting, upset stomach, increased confusion, etc Other reaction(s): Confusion Nausea, chills Nausea, chills   . Sulfamethoxazole-Trimethoprim Other (See Comments)    Too many side effects. Nausea, vomiting, upset stomach, increased confusion, etc    Thank you for allowing pharmacy to be a part of this patient's care.  Tobie Lords, PharmD, BCPS Clinical Pharmacist 11/06/2018

## 2018-11-06 NOTE — Plan of Care (Signed)
  Problem: Safety: Goal: Verbalization of understanding the information provided will improve Outcome: Progressing   Problem: Education: Goal: Knowledge of General Education information will improve Description Including pain rating scale, medication(s)/side effects and non-pharmacologic comfort measures Outcome: Progressing   Problem: Clinical Measurements: Goal: Ability to maintain clinical measurements within normal limits will improve Outcome: Progressing Goal: Will remain free from infection Outcome: Progressing Goal: Diagnostic test results will improve Outcome: Progressing   Problem: Activity: Goal: Risk for activity intolerance will decrease Outcome: Progressing   Problem: Nutrition: Goal: Adequate nutrition will be maintained Outcome: Progressing   Problem: Coping: Goal: Level of anxiety will decrease Outcome: Progressing   Problem: Elimination: Goal: Will not experience complications related to bowel motility Outcome: Progressing Goal: Will not experience complications related to urinary retention Outcome: Progressing   Problem: Pain Managment: Goal: General experience of comfort will improve Outcome: Progressing   Problem: Safety: Goal: Ability to remain free from injury will improve Outcome: Progressing   Problem: Skin Integrity: Goal: Risk for impaired skin integrity will decrease Outcome: Progressing

## 2018-11-07 DIAGNOSIS — I714 Abdominal aortic aneurysm, without rupture: Secondary | ICD-10-CM

## 2018-11-07 DIAGNOSIS — K551 Chronic vascular disorders of intestine: Secondary | ICD-10-CM

## 2018-11-07 DIAGNOSIS — I701 Atherosclerosis of renal artery: Secondary | ICD-10-CM

## 2018-11-07 LAB — CBC
HCT: 32.3 % — ABNORMAL LOW (ref 39.0–52.0)
Hemoglobin: 10.2 g/dL — ABNORMAL LOW (ref 13.0–17.0)
MCH: 27.7 pg (ref 26.0–34.0)
MCHC: 31.6 g/dL (ref 30.0–36.0)
MCV: 87.8 fL (ref 80.0–100.0)
Platelets: 120 10*3/uL — ABNORMAL LOW (ref 150–400)
RBC: 3.68 MIL/uL — ABNORMAL LOW (ref 4.22–5.81)
RDW: 16.1 % — ABNORMAL HIGH (ref 11.5–15.5)
WBC: 10 10*3/uL (ref 4.0–10.5)
nRBC: 0 % (ref 0.0–0.2)

## 2018-11-07 LAB — BASIC METABOLIC PANEL
Anion gap: 12 (ref 5–15)
BUN: 71 mg/dL — ABNORMAL HIGH (ref 8–23)
CO2: 17 mmol/L — ABNORMAL LOW (ref 22–32)
Calcium: 8.3 mg/dL — ABNORMAL LOW (ref 8.9–10.3)
Chloride: 113 mmol/L — ABNORMAL HIGH (ref 98–111)
Creatinine, Ser: 4.52 mg/dL — ABNORMAL HIGH (ref 0.61–1.24)
GFR calc Af Amer: 12 mL/min — ABNORMAL LOW (ref >60)
GFR calc non Af Amer: 10 mL/min — ABNORMAL LOW (ref >60)
Glucose, Bld: 163 mg/dL — ABNORMAL HIGH (ref 70–99)
Potassium: 4 mmol/L (ref 3.5–5.1)
Sodium: 142 mmol/L (ref 135–145)

## 2018-11-07 LAB — CREATININE, SERUM
Creatinine, Ser: 4.56 mg/dL — ABNORMAL HIGH (ref 0.61–1.24)
GFR calc Af Amer: 12 mL/min — ABNORMAL LOW (ref >60)
GFR calc non Af Amer: 10 mL/min — ABNORMAL LOW (ref >60)

## 2018-11-07 LAB — URINE CULTURE

## 2018-11-07 LAB — GLUCOSE, CAPILLARY
Glucose-Capillary: 63 mg/dL — ABNORMAL LOW (ref 70–99)
Glucose-Capillary: 139 mg/dL — ABNORMAL HIGH (ref 70–99)
Glucose-Capillary: 152 mg/dL — ABNORMAL HIGH (ref 70–99)
Glucose-Capillary: 212 mg/dL — ABNORMAL HIGH (ref 70–99)
Glucose-Capillary: 191 mg/dL — ABNORMAL HIGH (ref 70–99)

## 2018-11-07 LAB — VANCOMYCIN, RANDOM: Vancomycin Rm: 17

## 2018-11-07 LAB — MRSA PCR SCREENING: MRSA by PCR: NEGATIVE

## 2018-11-07 LAB — HIV ANTIBODY (ROUTINE TESTING W REFLEX): HIV Screen 4th Generation wRfx: NONREACTIVE

## 2018-11-07 MED ORDER — DEXTROSE 50 % IV SOLN
INTRAVENOUS | Status: AC
  Administered 2018-11-07: 09:00:00
  Filled 2018-11-07: qty 50

## 2018-11-07 MED ORDER — METOPROLOL TARTRATE 25 MG PO TABS
12.5000 mg | ORAL_TABLET | Freq: Two times a day (BID) | ORAL | Status: DC
  Administered 2018-11-07 – 2018-11-08 (×4): 12.5 mg via ORAL
  Filled 2018-11-07 (×4): qty 1

## 2018-11-07 MED ORDER — DEXTROSE 50 % IV SOLN
12.5000 g | Freq: Once | INTRAVENOUS | Status: AC
  Administered 2018-11-07: 12.5 g via INTRAVENOUS

## 2018-11-07 MED ORDER — AMLODIPINE BESYLATE 10 MG PO TABS
10.0000 mg | ORAL_TABLET | Freq: Every day | ORAL | Status: DC
  Administered 2018-11-07 – 2018-11-09 (×3): 10 mg via ORAL
  Filled 2018-11-07 (×3): qty 1

## 2018-11-07 MED ORDER — VANCOMYCIN HCL IN DEXTROSE 1-5 GM/200ML-% IV SOLN
1000.0000 mg | Freq: Once | INTRAVENOUS | Status: AC
  Administered 2018-11-07: 09:00:00 1000 mg via INTRAVENOUS
  Filled 2018-11-07: qty 200

## 2018-11-07 NOTE — Consult Note (Signed)
[facility] Consult Note  MRN : [id number]  Blake [name] is a 85 y.o. (Dec 10, 1922) male who presents with chief complaint of  Chief Complaint  Patient presents with  . Altered Mental Status   History of Present Illness:  The patient is a 85 year old male with a past medical history of prostate cancer, hypertension, hyperlipidemia, heart murmur, diabetes, and known history of abdominal aortic aneurysm.  The patient is well-known to our practice and we have been following his abdominal aneurysm for a while.  The patient's family is very involved in his care.  Last abdominal aortic duplex conducted on June 20, 2018 in our office was notable for a AAA measuring 5.1 cm.  This has been relatively stable over the last 2 years.  As per the patient and his family's wishes we have decided that there will be no intervention until his abdominal aortic aneurysm shows remarkable growth or should go to the size of 5.5 cm.  The patient sought medical attention at the [facility] emergency department for possible " seizure-like activity".  During a work-up for seizure the patient underwent a CT of the abdomen was notable for 5.3cm abdominal aortic aneurysm.  The patient denies any abdominal pain or pain radiating towards the back.  At this time, the patient denies any postprandial pain, nausea or vomiting during or after eating.  Patient denies any fever.  Vascular Surgery was consulted by Dr. Vianne Bulls for recommendations Current Facility-Administered Medications  Medication Dose Route Frequency Provider Last Rate Last Dose  . 0.9 %  sodium chloride infusion   Intravenous Continuous Mayo, Pete Pelt, MD 75 mL/hr at 11/07/18 1022    . acetaminophen (TYLENOL) tablet 650 mg  650 mg Oral Q6H PRN Mayo, Pete Pelt, MD       Or  . acetaminophen (TYLENOL) suppository 650 mg  650 mg Rectal Q6H PRN Mayo, Pete Pelt, MD      . amLODipine (NORVASC) tablet 10  mg  10 mg Oral Daily Epifanio Lesches, MD   10 mg at 11/07/18 1120  . ceFEPIme (MAXIPIME) 1 g in sodium chloride 0.9 % 100 mL IVPB  1 g Intravenous Q24H Sela Hua, MD 200 mL/hr at 11/07/18 0650 1 g at 11/07/18 0650  . famotidine (PEPCID) tablet 20 mg  20 mg Oral Daily Mayo, Pete Pelt, MD   20 mg at 11/07/18 1119  . finasteride (PROSCAR) tablet 5 mg  5 mg Oral Daily Mayo, Pete Pelt, MD   5 mg at 11/07/18 1120  . heparin injection 5,000 Units  5,000 Units Subcutaneous Q12H Rocky Morel, RPH   5,000 Units at 11/07/18 1119  . insulin aspart (novoLOG) injection 0-9 Units  0-9 Units Subcutaneous TID WC Mayo, Pete Pelt, MD   2 Units at 11/07/18 1235  . LORazepam (ATIVAN) injection 1 mg  1 mg Intravenous Q4H PRN Mayo, Pete Pelt, MD      . metoprolol tartrate (LOPRESSOR) tablet 12.5 mg  12.5 mg Oral BID Epifanio Lesches, MD   12.5 mg at 11/07/18 1120  . ondansetron (ZOFRAN) tablet 4 mg  4 mg Oral Q6H PRN Mayo, Pete Pelt, MD       Or  . ondansetron [facility]) injection 4 mg  4 mg Intravenous Q6H PRN Mayo, Pete Pelt, MD      . pantoprazole (PROTONIX) EC tablet 40 mg  40 mg Oral Daily Mayo, Pete Pelt, MD   40 mg at 11/07/18 1120  . polyethylene  glycol (MIRALAX / GLYCOLAX) packet 17 g  17 g Oral Daily PRN Mayo, Pete Pelt, MD      . rosuvastatin (CRESTOR) tablet 5 mg  5 mg Oral QHS Mayo, Pete Pelt, MD   5 mg at 11/05/18 2039  . sodium bicarbonate tablet 650 mg  650 mg Oral BID Sela Hua, MD   650 mg at 11/07/18 1119  . tamsulosin (FLOMAX) capsule 0.4 mg  0.4 mg Oral BID Sela Hua, MD   0.4 mg at 11/07/18 1119  . vancomycin variable dose per unstable renal function (pharmacist dosing)   Does not apply See admin instructions Mayo, Pete Pelt, MD       Past Medical History:  Diagnosis Date  . AAA (abdominal aortic aneurysm) ([place])   . Arrhythmia   . Diabetes mellitus without complication ([place])   . GI bleed   . Heart murmur   . Hyperlipidemia   . Hypertension   . Prostate cancer  [facility])    Past Surgical History:  Procedure Laterality Date  . FLEXIBLE SIGMOIDOSCOPY N/A 10/21/2016   Procedure: FLEXIBLE SIGMOIDOSCOPY;  Surgeon: [name], MD;  Location: [facility] ENDOSCOPY;  Service: Endoscopy;  Laterality: N/A;  . JOINT REPLACEMENT    . PACEMAKER INSERTION Left 04/14/2015   Procedure: INSERTION PACEMAKER;  Surgeon: Isaias Cowman, MD;  Location: [facility] ORS;  Service: Cardiovascular;  Laterality: Left;  . PROSTATE SURGERY    . VISCERAL ARTERY INTERVENTION N/A 10/22/2016   Procedure: Visceral Artery Intervention;  Surgeon: Algernon Huxley, MD;  Location: [place] CV LAB;  Service: Cardiovascular;  Laterality: N/A;   Social History Social History   Tobacco Use  . Smoking status: Former Research scientist (life sciences)  . Smokeless tobacco: Never Used  Substance Use Topics  . Alcohol use: No  . Drug use: No   Family History Family History  Problem Relation Age of Onset  . Hypertension Other   . Stroke Other   . Heart attack Other   . Stroke Sister   . Heart attack Brother   Denies family history of peripheral artery disease, venous disease or renal disease  Allergies  Allergen Reactions  . Diovan [Valsartan] Cough  . Gabapentin Other (See Comments)    Sedation at all doses  . Hydralazine Itching  . Lisinopril Cough  . Pregabalin Other (See Comments)    Sedation at all doses  . Levofloxacin Other (See Comments)    Too many side effects. Nausea, vomiting, upset stomach, increased confusion, etc Other reaction(s): Confusion Nausea, chills Nausea, chills   . Sulfamethoxazole-Trimethoprim Other (See Comments)    Too many side effects. Nausea, vomiting, upset stomach, increased confusion, etc   REVIEW OF SYSTEMS (Negative unless checked)  Constitutional: [] Weight loss  [] Fever  [] Chills Cardiac: [] Chest pain   [] Chest pressure   [] Palpitations   [] Shortness of breath when laying flat   [] Shortness of breath at rest   [] Shortness of breath with exertion. Vascular:  [] Pain in  legs with walking   [] Pain in legs at rest   [] Pain in legs when laying flat   [] Claudication   [] Pain in feet when walking  [] Pain in feet at rest  [] Pain in feet when laying flat   [] History of DVT   [] Phlebitis   [] Swelling in legs   [] Varicose veins   [] Non-healing ulcers AAA Pulmonary:   [] Uses home oxygen   [] Productive cough   [] Hemoptysis   [] Wheeze  [] COPD   [] Asthma Neurologic:  [] Dizziness  [] Blackouts   [] Seizures   []   History of stroke   [] History of TIA  [] Aphasia   [] Temporary blindness   [] Dysphagia   [] Weakness or numbness in arms   [] Weakness or numbness in legs Musculoskeletal:  [] Arthritis   [] Joint swelling   [] Joint pain   [] Low back pain Hematologic:  [] Easy bruising  [] Easy bleeding   [] Hypercoagulable state   [] Anemic  [] Hepatitis Gastrointestinal:  [] Blood in stool   [] Vomiting blood  [] Gastroesophageal reflux/heartburn   [] Difficulty swallowing. Genitourinary:  [x] Chronic kidney disease   [] Difficult urination  [] Frequent urination  [] Burning with urination   [] Blood in urine Skin:  [] Rashes   [] Ulcers   [] Wounds Psychological:  [] History of anxiety   []  History of major depression.  Physical Examination  Vitals:   11/06/18 0524 11/06/18 1923 11/07/18 0439 11/07/18 0915  BP: (!) 141/76 (!) 152/99 (!) 150/99 (!) 171/109  Pulse: 75 89 84 (!) 49  Resp: 18 18    Temp: 98.9 F (37.2 C) 98.3 F (36.8 C) 98.2 F (36.8 C)   TempSrc: Oral Oral Oral   SpO2: 99% 100% 100% 100%  Weight:      Height:       Body mass index is 27.97 kg/m. Gen:  WD/WN, NAD,  Head: [place]/AT, No temporalis wasting. Prominent temp pulse not noted. Ear/Nose/Throat: Hard of hearing, nares w/o erythema or drainage, oropharynx w/o Erythema/Exudate Eyes: Sclera non-icteric, conjunctiva clear, legally blind Neck: Trachea midline.  No JVD.  Pulmonary:  Good air movement, respirations not labored, equal bilaterally.  Cardiac: Regularly irregular. Vascular:  Vessel Right Left  Radial Palpable  Palpable  Ulnar Palpable Palpable  Brachial Palpable Palpable  Carotid Palpable, without bruit Palpable, without bruit  Aorta Not palpable N/A  Femoral Palpable Palpable  Popliteal Palpable Palpable  PT Palpable Palpable  DP Palpable Palpable   Gastrointestinal: soft, non-tender/non-distended. No guarding/reflex. No pulsatile mass noted Musculoskeletal: M/S 5/5 throughout.  Extremities without ischemic changes.  No deformity or atrophy. Mild edema Neurologic: Sensation grossly intact in extremities.  Symmetrical.  Speech is fluent. Motor exam as listed above. Psychiatric: Judgment intact, Mood & affect appropriate for pt's clinical situation. Dermatologic: No rashes or ulcers noted.  No cellulitis or open wounds. Lymph : No Cervical, Axillary, or Inguinal lymphadenopathy.  CBC Lab Results  Component Value Date   WBC 10.0 11/07/2018   HGB 10.2 (L) 11/07/2018   HCT 32.3 (L) 11/07/2018   MCV 87.8 11/07/2018   PLT 120 (L) 11/07/2018   BMET    Component Value Date/Time   NA 142 11/07/2018 1007   NA 137 06/16/2014 1337   K 4.0 11/07/2018 1007   K 4.3 06/16/2014 1337   CL 113 (H) 11/07/2018 1007   CL 100 06/16/2014 1337   CO2 17 (L) 11/07/2018 1007   CO2 27 06/16/2014 1337   GLUCOSE 163 (H) 11/07/2018 1007   GLUCOSE 215 (H) 06/16/2014 1337   BUN 71 (H) 11/07/2018 1007   BUN 29 (H) 06/16/2014 1337   CREATININE 4.52 (H) 11/07/2018 1007   CREATININE 1.48 (H) 06/16/2014 1337   CALCIUM 8.3 (L) 11/07/2018 1007   CALCIUM 8.4 (L) 06/16/2014 1337   GFRNONAA 10 (L) 11/07/2018 1007   GFRNONAA 48 (L) 06/16/2014 1337   GFRNONAA 48 (L) 12/29/2012 1722   GFRAA 12 (L) 11/07/2018 1007   GFRAA 58 (L) 06/16/2014 1337   GFRAA 56 (L) 12/29/2012 1722   Estimated Creatinine Clearance: 12.8 mL/min (A) (by C-G formula based on SCr of 4.52 mg/dL (H)).  COAG Lab Results  Component  Value Date   INR 1.26 06/10/2017   INR 1.35 05/17/2017   INR 1.32 10/22/2016   Radiology Ct Abdomen Pelvis Wo  Contrast  Result Date: 11/06/2018 CLINICAL DATA:  Bilateral hydronephrosis. EXAM: CT ABDOMEN AND PELVIS WITHOUT CONTRAST TECHNIQUE: Multidetector CT imaging of the abdomen and pelvis was performed following the standard protocol without IV contrast. COMPARISON:  Ultrasound of same day.  CT scan of February 28, 2018. FINDINGS: Lower chest: Mild bilateral posterior basilar subsegmental atelectasis or infiltrate is noted. Hepatobiliary: No focal liver abnormality is seen. No gallstones, gallbladder wall thickening, or biliary dilatation. Pancreas: Unremarkable. No pancreatic ductal dilatation or surrounding inflammatory changes. Spleen: Normal in size without focal abnormality. Adrenals/Urinary Tract: Adrenal glands appear normal. Right renal atrophy is noted. Bilateral nephrolithiasis is noted severe bilateral hydronephrosis is noted. No definite ureteral calculus is noted. There is seen a calcification just inferior to the right ureterovesical junction which was present on the prior exam and most likely represents phlebolith, not ureteral calculus. Probable diverticulum is seen arising from superior and right side of urinary bladder. Stomach/Bowel: The stomach appears normal. There is no evidence of bowel obstruction or inflammation. Diverticulosis is noted throughout the colon. The appendix is not clearly visualized. Vascular/Lymphatic: 5.3 cm infrarenal saccular abdominal aortic aneurysm is noted. Atherosclerosis of thoracic aorta is noted. No significant adenopathy is noted. Reproductive: Status post prostate surgery. Status post brachytherapy seed placement. Other: No ascites is noted. Mild fat containing periumbilical hernia is noted. Musculoskeletal: No acute or significant osseous findings. IMPRESSION: Bilateral nephrolithiasis is noted. Severe bilateral hydroureteronephrosis is noted without definite evidence of obstructing calculus. This is concerning for distal ureteral strictures. Status post prostatic  brachytherapy seed placement and prostate surgery. 5.3 cm infrarenal abdominal aortic aneurysm. Recommend followup by abdomen and pelvis CTA in 3-6 months, and vascular surgery referral/consultation if not already obtained. This recommendation follows ACR consensus guidelines: White Paper of the ACR Incidental Findings Committee II on Vascular Findings. J Am Coll Radiol 2013; 10:789-794. Aortic aneurysm NOS (ICD10-I71.9). Diverticulosis is noted throughout the colon without inflammation. Mild bilateral posterior basilar subsegmental atelectasis or infiltrates are noted. Aortic Atherosclerosis (ICD10-I70.0). Electronically Signed   By: Marijo Conception M.D.   On: 11/06/2018 14:27   Ct Head Wo Contrast  Result Date: 11/05/2018 CLINICAL DATA:  85 year old with possible seizure earlier today. Hypotension upon EMS arrival. Possibly postictal upon EMS arrival. EXAM: CT HEAD WITHOUT CONTRAST TECHNIQUE: Contiguous axial images were obtained from the base of the skull through the vertex without intravenous contrast. COMPARISON:  06/16/2014 and earlier. FINDINGS: Brain: Severe age related cortical, deep and cerebellar atrophy, progressive since 48. Moderate changes of small vessel disease of the white matter diffusely, also progressive. No mass lesion. No midline shift. No acute hemorrhage or hematoma. No extra-axial fluid collections. No evidence of acute infarction. Vascular: Severe BILATERAL carotid siphon and RIGHT vertebral artery atherosclerosis. No hyperdense vessel. Skull: No skull fracture or other focal osseous abnormality involving the skull. Sinuses/Orbits: Visualized paranasal sinuses, bilateral mastoid air cells and bilateral middle ear cavities well-aerated. Senile calcifications involving both globes. No significant abnormality involving the orbits or globes. Other: None. IMPRESSION: 1. No acute intracranial abnormality. 2. Severe age related generalized atrophy and moderate chronic microvascular ischemic  changes of the white matter, progressive since 2015. Electronically Signed   By: Evangeline Dakin M.D.   On: 11/05/2018 16:07   US Renal  Result Date: 11/06/2018 CLINICAL DATA:  Acute renal insufficiency. EXAM: RENAL / URINARY TRACT ULTRASOUND COMPLETE COMPARISON:  CT 07/16/2016 FINDINGS: Right Kidney: Renal measurements: 12.5 x 6.6 x 6.2 cm = volume: 268 mL . Echogenicity within normal limits. No mass visualized. Moderate hydronephrosis which was also noted on previous CT. Left Kidney: Renal measurements: 13.7 x 7.2 x 6.1 cm = volume: 320 mL. Echogenicity within normal limits. No mass visualized. Moderate hydronephrosis which was also noted on previous CT. Bladder: Mild-to-moderately filled as patient unable to void. Patient also unable to move as there is mild echogenic material along the dependent portion of the bladder which may represent mild wall thickening versus echogenic dependent debris. IMPRESSION: Normal size kidneys with moderate bilateral chronic hydronephrosis. No focal mass or nephrolithiasis. Mild echogenic material along the dependent portion of the bladder which may be due to wall thickening versus dependent debris. Electronically Signed   By: Marin Olp M.D.   On: 11/06/2018 10:04   Dg Chest Port 1 View  Result Date: 11/05/2018 CLINICAL DATA:  85 year old with a seizure-like episode earlier today and now has excessive oral secretions. EXAM: PORTABLE CHEST 1 VIEW COMPARISON:  09/10/2017 and earlier. FINDINGS: Cardiac silhouette mildly enlarged for AP portable technique, unchanged. LEFT subclavian dual lead transvenous pacemaker unchanged. Thoracic aorta atherosclerotic, unchanged. Hilar and mediastinal contours otherwise unremarkable. Airspace consolidation involving the RIGHT lung base. Linear atelectasis involving the LEFT lung base. No pleural effusions. Pulmonary vascularity normal without evidence of pulmonary edema. IMPRESSION: 1. Acute pneumonia involving the RIGHT lung base. 2.  Linear atelectasis involving the LEFT lung base. 3. Stable mild cardiomegaly without pulmonary edema. Electronically Signed   By: Evangeline Dakin M.D.   On: 11/05/2018 21:07   Assessment/Plan The patient is a 85 year old male with a past medical history of prostate cancer, hypertension, hyperlipidemia, heart murmur, diabetes, and known history of abdominal aortic aneurysm. 1. AAA: Patient is well-known to our practice.  Patient with a stable AAA.  The patient's family plays an active role in his care.  As per the patient and his family, they have decided not to move forward with any type of repair unless his aneurysm were to progress to 5.5 cm.  At this time, the patient is asymptomatic.  His physical exam is unremarkable.  We will continue to watch the patient's aneurysm with duplex every 6 months. 2. Left Renal Artery Stenosis: Left renal artery stenosis seen on CT.  At this time, due to the patient's increased creatinine would not recommend moving forward with any endovascular intervention as contrast will certainly worsen his renal status. 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  At this time, his physical exam is unremarkable and he seems to be asymptomatic denying any postprandial pain, nausea vomiting.  Due to his kidney function do not recommend moving forward any endovascular intervention as the contrast will certainly worsen his renal status.  Discussed with Dr. Mayme Genta, PA-C  11/07/2018 1:35 PM  This note was created with Dragon medical transcription system.  Any error is purely unintentional

## 2018-11-07 NOTE — TOC Progression Note (Signed)
Transition of Care [facility]) - Progression Note    Patient Details  Name: Blake [name] MRN: [id number] Date of Birth: 09-28-1922  Transition of [facility]) CM/SW Contact  Shade Flood, LCSW Phone Number: 11/07/2018, 10:28 AM  Clinical Narrative:     PT recommending HH PT at dc. Spoke with pt's daughter by phone to inform. Daughter states that pt has had [facility] from [facility] in the past and they would like to have them again. Dtr states pt had Sula Soda for PT and would like her again if possible. Spoke with Corene Cornea from [facility] to give the referral and mentioned request for [facility] for [facility]. Per Corene Cornea, they will accommodate. Pt may also benefit from [facility] RN. Will update MD on status of dc planning.  DC timeframe not yet known. TOC will follow.  Expected Discharge Plan: [place] Barriers to Discharge: Continued Medical Work up  Expected Discharge Plan and Services Expected Discharge Plan: [place] Choice: [place] arrangements for the past 2 months: Single Family Home Expected Discharge Date: 11/08/18                         HH Arranged: RN, PT [place] Agency: [place] ([place]) Date [place]: 11/07/18 Time [place]: 1025 Representative spoke with at [place]: [place] ([place]) Interventions    Readmission Risk Interventions Readmission Risk Prevention Plan 11/06/2018  Transportation Screening Complete  Some recent data might be hidden

## 2018-11-07 NOTE — Progress Notes (Signed)
Subjective: no seizure type of activitiy.    Past Medical History:  Diagnosis Date  . AAA (abdominal aortic aneurysm) ([place])   . Arrhythmia   . Diabetes mellitus without complication ([place])   . GI bleed   . Heart murmur   . Hyperlipidemia   . Hypertension   . Prostate cancer [facility])     Past Surgical History:  Procedure Laterality Date  . FLEXIBLE SIGMOIDOSCOPY N/A 10/21/2016   Procedure: FLEXIBLE SIGMOIDOSCOPY;  Surgeon: [name], MD;  Location: [facility] ENDOSCOPY;  Service: Endoscopy;  Laterality: N/A;  . JOINT REPLACEMENT    . PACEMAKER INSERTION Left 04/14/2015   Procedure: INSERTION PACEMAKER;  Surgeon: Isaias Cowman, MD;  Location: [facility] ORS;  Service: Cardiovascular;  Laterality: Left;  . PROSTATE SURGERY    . VISCERAL ARTERY INTERVENTION N/A 10/22/2016   Procedure: Visceral Artery Intervention;  Surgeon: Algernon Huxley, MD;  Location: [facility];  Service: Cardiovascular;  Laterality: N/A;    Family History  Problem Relation Age of Onset  . Hypertension Other   . Stroke Other   . Heart attack Other   . Stroke Sister   . Heart attack Brother     Social History:  reports that he has quit smoking. He has never used smokeless tobacco. He reports that he does not drink alcohol or use drugs.  Allergies  Allergen Reactions  . Diovan [Valsartan] Cough  . Gabapentin Other (See Comments)    Sedation at all doses  . Hydralazine Itching  . Lisinopril Cough  . Pregabalin Other (See Comments)    Sedation at all doses  . Levofloxacin Other (See Comments)    Too many side effects. Nausea, vomiting, upset stomach, increased confusion, etc Other reaction(s): Confusion Nausea, chills Nausea, chills   . Sulfamethoxazole-Trimethoprim Other (See Comments)    Too many side effects. Nausea, vomiting, upset stomach, increased confusion, etc    Medications: I have reviewed the patient's current medications.    Physical Examination: Blood pressure (!) 171/109, pulse (!)  49, temperature 98.2 F (36.8 C), temperature source Oral, resp. rate 18, height 6\' 2"  (1.88 m), weight 98.8 kg, SpO2 100 %.    Neurological Examination   Mental Status: Alert to name only  Cranial Nerves: II: Discs flat bilaterally; Visual fields grossly normal, pupils equal, round, reactive to light and accommodation III,IV, VI: ptosis not present, extra-ocular motions intact bilaterally V,VII: smile symmetric, facial light touch sensation normal bilaterally VIII: hearing normal bilaterally IX,X: gag reflex present XI: bilateral shoulder shrug XII: midline tongue extension Motor: Generalized weakness bilaterally  Tone and bulk:normal tone throughout; no atrophy noted Sensory: Pinprick and light touch intact throughout, bilaterally Deep Tendon Reflexes: 1+ and symmetric throughout Plantars: Right: downgoing   Left: downgoing Cerebellar: Not tested  Gait: not tested      Laboratory Studies:   Basic Metabolic Panel: Recent Labs  Lab 11/05/18 1530 11/06/18 0055 11/07/18 0450 11/07/18 1007  NA 134* 136  --  142  K 3.8 4.1  --  4.0  CL 102 107  --  113*  CO2 16* 16*  --  17*  GLUCOSE 180* 157*  --  163*  BUN 86* 82*  --  71*  CREATININE 5.81* 5.25* 4.56* 4.52*  CALCIUM 8.1* 7.9*  --  8.3*    Liver Function Tests: Recent Labs  Lab 11/05/18 1530  AST 16  ALT 15  ALKPHOS 75  BILITOT 0.8  PROT 7.5  ALBUMIN 3.0*   No results for input(s): LIPASE,  AMYLASE in the last 168 hours. No results for input(s): AMMONIA in the last 168 hours.  CBC: Recent Labs  Lab 11/05/18 1530 11/06/18 0055 11/07/18 1007  WBC 10.6* 16.1* 10.0  NEUTROABS 8.9*  --   --   HGB 9.7* 9.2* 10.2*  HCT 30.3* 28.7* 32.3*  MCV 85.8 86.2 87.8  PLT 116* 115* 120*    Cardiac Enzymes: Recent Labs  Lab 11/05/18 1652  CKTOTAL 45*    BNP: Invalid input(s): POCBNP  CBG: Recent Labs  Lab 11/06/18 1143 11/06/18 1812 11/06/18 1955 11/07/18 0732 11/07/18 0912  GLUCAP 105* 89 33  63* 139*    Microbiology: Results for orders placed or performed during the hospital encounter of 11/05/18  Culture, blood (routine x 2) Call MD if unable to obtain prior to antibiotics being given     Status: None (Preliminary result)   Collection Time: 11/06/18 12:46 AM  Result Value Ref Range Status   Specimen Description BLOOD LEFT ANTECUBITAL  Final   Special Requests   Final    BOTTLES DRAWN AEROBIC AND ANAEROBIC Blood Culture adequate volume   Culture   Final    NO GROWTH 1 DAY Performed at [facility], [address] [place], [place] [address]    Report Status PENDING  Incomplete  Culture, blood (routine x 2) Call MD if unable to obtain prior to antibiotics being given     Status: None (Preliminary result)   Collection Time: 11/06/18 12:52 AM  Result Value Ref Range Status   Specimen Description BLOOD LEFT FOREARM  Final   Special Requests   Final    BOTTLES DRAWN AEROBIC AND ANAEROBIC Blood Culture adequate volume   Culture   Final    NO GROWTH 1 DAY Performed at [facility], [address] [place], [place] [address]    Report Status PENDING  Incomplete    Coagulation Studies: No results for input(s): LABPROT, INR in the last 72 hours.  Urinalysis:  Recent Labs  Lab 11/05/18 2027  COLORURINE YELLOW*  LABSPEC 1.009  PHURINE 5.0  GLUCOSEU NEGATIVE  HGBUR LARGE*  BILIRUBINUR NEGATIVE  KETONESUR NEGATIVE  PROTEINUR NEGATIVE  NITRITE NEGATIVE  LEUKOCYTESUR LARGE*    Lipid Panel:  No results found for: CHOL, TRIG, HDL, CHOLHDL, VLDL, LDLCALC  HgbA1C:  Lab Results  Component Value Date   HGBA1C 6.9 (H) 04/01/2017    Urine Drug Screen:      Component Value Date/Time   LABOPIA NONE DETECTED 11/05/2018 2027   COCAINSCRNUR NONE DETECTED 11/05/2018 2027   LABBENZ NONE DETECTED 11/05/2018 2027   AMPHETMU NONE DETECTED 11/05/2018 2027   THCU NONE DETECTED 11/05/2018 2027   LABBARB NONE DETECTED 11/05/2018 2027    Alcohol Level: No  results for input(s): ETH in the last 168 hours.  Other results: EKG: irreg, irreg in the 70s.  Imaging: Ct Abdomen Pelvis Wo Contrast  Result Date: 11/06/2018 CLINICAL DATA:  Bilateral hydronephrosis. EXAM: CT ABDOMEN AND PELVIS WITHOUT CONTRAST TECHNIQUE: Multidetector CT imaging of the abdomen and pelvis was performed following the standard protocol without IV contrast. COMPARISON:  Ultrasound of same day.  CT scan of February 28, 2018. FINDINGS: Lower chest: Mild bilateral posterior basilar subsegmental atelectasis or infiltrate is noted. Hepatobiliary: No focal liver abnormality is seen. No gallstones, gallbladder wall thickening, or biliary dilatation. Pancreas: Unremarkable. No pancreatic ductal dilatation or surrounding inflammatory changes. Spleen: Normal in size without focal abnormality. Adrenals/Urinary Tract: Adrenal glands appear normal. Right renal atrophy is noted. Bilateral nephrolithiasis  is noted severe bilateral hydronephrosis is noted. No definite ureteral calculus is noted. There is seen a calcification just inferior to the right ureterovesical junction which was present on the prior exam and most likely represents phlebolith, not ureteral calculus. Probable diverticulum is seen arising from superior and right side of urinary bladder. Stomach/Bowel: The stomach appears normal. There is no evidence of bowel obstruction or inflammation. Diverticulosis is noted throughout the colon. The appendix is not clearly visualized. Vascular/Lymphatic: 5.3 cm infrarenal saccular abdominal aortic aneurysm is noted. Atherosclerosis of thoracic aorta is noted. No significant adenopathy is noted. Reproductive: Status post prostate surgery. Status post brachytherapy seed placement. Other: No ascites is noted. Mild fat containing periumbilical hernia is noted. Musculoskeletal: No acute or significant osseous findings. IMPRESSION: Bilateral nephrolithiasis is noted. Severe bilateral hydroureteronephrosis is  noted without definite evidence of obstructing calculus. This is concerning for distal ureteral strictures. Status post prostatic brachytherapy seed placement and prostate surgery. 5.3 cm infrarenal abdominal aortic aneurysm. Recommend followup by abdomen and pelvis CTA in 3-6 months, and vascular surgery referral/consultation if not already obtained. This recommendation follows ACR consensus guidelines: White Paper of the ACR Incidental Findings Committee II on Vascular Findings. J Am Coll Radiol 2013; 10:789-794. Aortic aneurysm NOS (ICD10-I71.9). Diverticulosis is noted throughout the colon without inflammation. Mild bilateral posterior basilar subsegmental atelectasis or infiltrates are noted. Aortic Atherosclerosis (ICD10-I70.0). Electronically Signed   By: Marijo Conception M.D.   On: 11/06/2018 14:27   Ct Head Wo Contrast  Result Date: 11/05/2018 CLINICAL DATA:  85 year old with possible seizure earlier today. Hypotension upon EMS arrival. Possibly postictal upon EMS arrival. EXAM: CT HEAD WITHOUT CONTRAST TECHNIQUE: Contiguous axial images were obtained from the base of the skull through the vertex without intravenous contrast. COMPARISON:  06/16/2014 and earlier. FINDINGS: Brain: Severe age related cortical, deep and cerebellar atrophy, progressive since 58. Moderate changes of small vessel disease of the white matter diffusely, also progressive. No mass lesion. No midline shift. No acute hemorrhage or hematoma. No extra-axial fluid collections. No evidence of acute infarction. Vascular: Severe BILATERAL carotid siphon and RIGHT vertebral artery atherosclerosis. No hyperdense vessel. Skull: No skull fracture or other focal osseous abnormality involving the skull. Sinuses/Orbits: Visualized paranasal sinuses, bilateral mastoid air cells and bilateral middle ear cavities well-aerated. Senile calcifications involving both globes. No significant abnormality involving the orbits or globes. Other: None.  IMPRESSION: 1. No acute intracranial abnormality. 2. Severe age related generalized atrophy and moderate chronic microvascular ischemic changes of the white matter, progressive since 2015. Electronically Signed   By: Evangeline Dakin M.D.   On: 11/05/2018 16:07   US Renal  Result Date: 11/06/2018 CLINICAL DATA:  Acute renal insufficiency. EXAM: RENAL / URINARY TRACT ULTRASOUND COMPLETE COMPARISON:  CT 07/16/2016 FINDINGS: Right Kidney: Renal measurements: 12.5 x 6.6 x 6.2 cm = volume: 268 mL . Echogenicity within normal limits. No mass visualized. Moderate hydronephrosis which was also noted on previous CT. Left Kidney: Renal measurements: 13.7 x 7.2 x 6.1 cm = volume: 320 mL. Echogenicity within normal limits. No mass visualized. Moderate hydronephrosis which was also noted on previous CT. Bladder: Mild-to-moderately filled as patient unable to void. Patient also unable to move as there is mild echogenic material along the dependent portion of the bladder which may represent mild wall thickening versus echogenic dependent debris. IMPRESSION: Normal size kidneys with moderate bilateral chronic hydronephrosis. No focal mass or nephrolithiasis. Mild echogenic material along the dependent portion of the bladder which may be due to wall thickening  versus dependent debris. Electronically Signed   By: Marin Olp M.D.   On: 11/06/2018 10:04   Dg Chest Port 1 View  Result Date: 11/05/2018 CLINICAL DATA:  85 year old with a seizure-like episode earlier today and now has excessive oral secretions. EXAM: PORTABLE CHEST 1 VIEW COMPARISON:  09/10/2017 and earlier. FINDINGS: Cardiac silhouette mildly enlarged for AP portable technique, unchanged. LEFT subclavian dual lead transvenous pacemaker unchanged. Thoracic aorta atherosclerotic, unchanged. Hilar and mediastinal contours otherwise unremarkable. Airspace consolidation involving the RIGHT lung base. Linear atelectasis involving the LEFT lung base. No pleural  effusions. Pulmonary vascularity normal without evidence of pulmonary edema. IMPRESSION: 1. Acute pneumonia involving the RIGHT lung base. 2. Linear atelectasis involving the LEFT lung base. 3. Stable mild cardiomegaly without pulmonary edema. Electronically Signed   By: Evangeline Dakin M.D.   On: 11/05/2018 21:07     Assessment/Plan:  85 y.o. male  known history of hypertension, Aifb, PPM hyperlipidemia, type 2 diabetes, hx of prostate cancer who presented to the ED with a shaking episode yesterday.  Last morning he had an episode of generalized shaking of all of his limbs that lasted 10 to 15 seconds and then resolved on its own.  It sounds like he was confused after the episode.  He did have one episode of vomiting.  Patient states that he felt fine immediately prior to the episode.  Apparently this has happened twice in the past, in 2006 and 2014.  Patient has never been on antiepileptic medicines before.  He was recently treated for UTI as an outpatient.  He does have recurrent UTIs.  He denies any recent dysuria, urinary urgency, urinary frequency, fevers, or chills.  Denies any chest pain or palpitations.  - Possibly seizure activity that has been provoked by UTI's - He had normal routine EEG  - Unable to obtain MRI brain as has PPM - Pt does not drive - CTH lots of generalized atrophy. Nothing acute.  - No anti epileptics at this time.   11/07/2018, 11:11 AM

## 2018-11-07 NOTE — Progress Notes (Signed)
Physical Therapy Treatment Patient Details Name: Blake [name] MRN: [id number] DOB: 29-Jun-1923 Today's Date: 11/07/2018    History of Present Illness Blake [name] is a 85yo male who comes to [facility] on 4/26 c AMS after a 10-15 minute shaking episode at home, also an episode of emesis. PMH: HTN, HLD, DM2, PrCA, bladderCA c frequent UTI. Pt admieeted with seizure activity, also seen to have UTI. Pt is familiar to author from prior admissions.     PT Comments    Pt received in long sitting in bed, awake, watching TV, reports he is agreeable to participate. Pt  Assisted to EOB, transfers x2, AMB into hall and back. Pt assisted to chair and set-up with lunch tray. Pt continues to requires heavy physical assistance, although per pt reports, not substantially more assistance than his recent baseline. AMB is limited to  Less than 61ft, but no physical assist is required for stability with AMB. Pt needs redirection verbal cues for directions back to bed. Pt progressing toward goals overall. Pt reports he feels much better today.     Follow Up Recommendations  Home health PT;Supervision for mobility/OOB     Equipment Recommendations  None recommended by PT    Recommendations for Other Services       Precautions / Restrictions Precautions Precautions: Fall Restrictions Weight Bearing Restrictions: No    Mobility  Bed Mobility Overal bed mobility: Needs Assistance Bed Mobility: Supine to Sit     Supine to sit: Mod assist     General bed mobility comments: pt reports as similar to baseline  Transfers Overall transfer level: Needs assistance Equipment used: Rolling walker (2 wheeled) Transfers: Sit to/from Stand Sit to Stand: Mod assist;From elevated surface         General transfer comment: pt reports as similar to baseline; performed twice, limited mostly but feet sliding forward on floor.   Ambulation/Gait   Gait Distance (Feet): 46 Feet(limited by fatigue) Assistive  device: Rolling walker (2 wheeled) Gait Pattern/deviations: Step-to pattern     General Gait Details: 3-point step-to gait, completely lifting RW to move it; appears well established pattern. Similar ot prior admissions.    Stairs             Wheelchair Mobility    Modified Rankin (Stroke Patients Only)       Balance Overall balance assessment: Needs assistance         Standing balance support: During functional activity Standing balance-Leahy Scale: Poor Standing balance comment: needs stabilization and lift assst to establish stability when coming to standing                            Cognition Arousal/Alertness: Awake/alert Behavior During Therapy: WFL for tasks assessed/performed Overall Cognitive Status: Within Functional Limits for tasks assessed                                 General Comments: mild deficits noted, but back to baseline      Exercises      General Comments        Pertinent Vitals/Pain Pain Assessment: No/denies pain    Home Living                      Prior Function            PT Goals (current goals can now be found in  the care plan section) Acute Rehab PT Goals Patient Stated Goal: return to home, regain strength  PT Goal Formulation: With patient Time For Goal Achievement: 11/20/18 Potential to Achieve Goals: Good Progress towards PT goals: Progressing toward goals    Frequency    Min 2X/week      PT Plan Current plan remains appropriate    Co-evaluation              AM-PAC PT "6 Clicks" Mobility   Outcome Measure  Help needed turning from your back to your side while in a flat bed without using bedrails?: A Little Help needed moving from lying on your back to sitting on the side of a flat bed without using bedrails?: A Lot Help needed moving to and from a bed to a chair (including a wheelchair)?: A Lot Help needed standing up from a chair using your arms (e.g.,  wheelchair or bedside chair)?: A Lot Help needed to walk in hospital room?: A Lot Help needed climbing 3-5 steps with a railing? : Total 6 Click Score: 12    End of Session Equipment Utilized During Treatment: Gait belt Activity Tolerance: Patient tolerated treatment well;Patient limited by fatigue Patient left: in chair;with chair alarm set;with call bell/phone within reach(meal tray presented) Nurse Communication: Mobility status PT Visit Diagnosis: Muscle weakness (generalized) (M62.81);Difficulty in walking, not elsewhere classified (R26.2);Other abnormalities of gait and mobility (R26.89)     Time: 1751-0258 PT Time Calculation (min) (ACUTE ONLY): 25 min  Charges:  $Therapeutic Exercise: 8-22 mins $Therapeutic Activity: 8-22 mins                     12:38 PM, 11/07/18 Etta Grandchild, PT, DPT Physical Therapist - [facility]  [phone] ([place])     , C 11/07/2018, 12:31 PM

## 2018-11-07 NOTE — Progress Notes (Signed)
Pharmacy Antibiotic Note  Blake [name] is a 85 y.o. male admitted on 11/05/2018 with pneumonia.  Pharmacy has been consulted for vanc/cefepime dosing. Patient admitted w/ AMS w/ h/o CKD III currently AKI on CKD (baseline Scr 2.2 - 2.5). Patient received vanc 2g IV load on 4/27 at [facility]: 4/28 Vanc Random  17 @ 5621.  Patient is in AKI on CKD will continue to dose based on random levels.  Current SCr suggests Ke 0.014, half life 48h, Vd 71 L;  Will order vancomycin 1g IV x 1 dose and will recheck a random vancomycin level in 48 hours. If Scr improves will consider check level earlier.   Will continue to re-dose if random level is < 20 mcg/mL  Will continue monitoring renal function and will adjust doses if necessary.  Height: 6\' 2"  (188 cm) Weight: 217 lb 13 oz (98.8 kg) IBW/kg (Calculated) : 82.2  Temp (24hrs), Avg:98.3 F (36.8 C), Min:98.2 F (36.8 C), Max:98.3 F (36.8 C)  Recent Labs  Lab 11/05/18 1530 11/06/18 0055 11/07/18 0450  WBC 10.6* 16.1*  --   CREATININE 5.81* 5.25* 4.56*  VANCORANDOM  --   --  17    Estimated Creatinine Clearance: 12.7 mL/min (A) (by C-G formula based on SCr of 4.56 mg/dL (H)).    Allergies  Allergen Reactions  . Diovan [Valsartan] Cough  . Gabapentin Other (See Comments)    Sedation at all doses  . Hydralazine Itching  . Lisinopril Cough  . Pregabalin Other (See Comments)    Sedation at all doses  . Levofloxacin Other (See Comments)    Too many side effects. Nausea, vomiting, upset stomach, increased confusion, etc Other reaction(s): Confusion Nausea, chills Nausea, chills   . Sulfamethoxazole-Trimethoprim Other (See Comments)    Too many side effects. Nausea, vomiting, upset stomach, increased confusion, etc   BCx x2 collected MRSA PCR ordered  Thank you for allowing pharmacy to be a part of this patient's care.  Pernell Dupre, PharmD, BCPS Clinical Pharmacist 11/07/2018 5:42 AM

## 2018-11-07 NOTE — Progress Notes (Signed)
[facility] NAME: Blake [name]    MR#:  [id number]  DATE OF BIRTH:  Jun 01, 1923  SUBJECTIVE: Patient alert, awake, oriented, has hypoglycemia as he is n.p.o.  BP running high but heart rate is on the lower side so I decreased the metoprolol dose, will use IV hydralazine.  CHIEF COMPLAINT:   Chief Complaint  Patient presents with  . Altered Mental Status    REVIEW OF SYSTEMS:   ROS CONSTITUTIONAL: No fever, fatigue or weakness.  EYES: No blurred or double vision.  EARS, NOSE, AND THROAT: No tinnitus or ear pain.  RESPIRATORY: No cough, shortness of breath, wheezing or hemoptysis.  CARDIOVASCULAR: No chest pain, orthopnea, edema.  GASTROINTESTINAL: No nausea, vomiting, diarrhea or abdominal pain.  GENITOURINARY: No dysuria, hematuria.  ENDOCRINE: No polyuria, nocturia,  HEMATOLOGY: No anemia, easy bruising or bleeding SKIN: No rash or lesion. MUSCULOSKELETAL: No joint pain or arthritis.   NEUROLOGIC: No tingling, numbness, weakness.  PSYCHIATRY: No anxiety or depression.   DRUG ALLERGIES:   Allergies  Allergen Reactions  . Diovan [Valsartan] Cough  . Gabapentin Other (See Comments)    Sedation at all doses  . Hydralazine Itching  . Lisinopril Cough  . Pregabalin Other (See Comments)    Sedation at all doses  . Levofloxacin Other (See Comments)    Too many side effects. Nausea, vomiting, upset stomach, increased confusion, etc Other reaction(s): Confusion Nausea, chills Nausea, chills   . Sulfamethoxazole-Trimethoprim Other (See Comments)    Too many side effects. Nausea, vomiting, upset stomach, increased confusion, etc    VITALS:  Blood pressure (!) 171/109, pulse (!) 49, temperature 98.2 F (36.8 C), temperature source Oral, resp. rate 18, height 6\' 2"  (1.88 m), weight 98.8 kg, SpO2 100 %.  PHYSICAL EXAMINATION:  GENERAL:  85 y.o.-year-old patient lying in the bed with no acute distress.  EYES: Pupils  equal, round, reactive to light and accommodation. No scleral icterus. Extraocular muscles intact.  HEENT: Head atraumatic, normocephalic. Oropharynx and nasopharynx clear.  NECK:  Supple, no jugular venous distention. No thyroid enlargement, no tenderness.  LUNGS: Normal breath sounds bilaterally, no wheezing, rales,rhonchi or crepitation. No use of accessory muscles of respiration.  CARDIOVASCULAR: S1, S2 normal. No murmurs, rubs, or gallops.  ABDOMEN: Soft, nontender, nondistended. Bowel sounds present. No organomegaly or mass.  EXTREMITIES: No pedal edema, cyanosis, or clubbing.  NEUROLOGIC: Cranial nerves II through XII are intact. Muscle strength 5/5 in all extremities. Sensation intact. Gait not checked.  PSYCHIATRIC: The patient is alert and oriented x 3.  SKIN: No obvious rash, lesion, or ulcer.    LABORATORY PANEL:   CBC Recent Labs  Lab 11/06/18 0055  WBC 16.1*  HGB 9.2*  HCT 28.7*  PLT 115*   ------------------------------------------------------------------------------------------------------------------  Chemistries  Recent Labs  Lab 11/05/18 1530 11/06/18 0055 11/07/18 0450  NA 134* 136  --   K 3.8 4.1  --   CL 102 107  --   CO2 16* 16*  --   GLUCOSE 180* 157*  --   BUN 86* 82*  --   CREATININE 5.81* 5.25* 4.56*  CALCIUM 8.1* 7.9*  --   AST 16  --   --   ALT 15  --   --   ALKPHOS 75  --   --   BILITOT 0.8  --   --    ------------------------------------------------------------------------------------------------------------------  Cardiac Enzymes No results for input(s): TROPONINI in the last 168 hours. ------------------------------------------------------------------------------------------------------------------  RADIOLOGY:  Ct Abdomen Pelvis Wo Contrast  Result Date: 11/06/2018 CLINICAL DATA:  Bilateral hydronephrosis. EXAM: CT ABDOMEN AND PELVIS WITHOUT CONTRAST TECHNIQUE: Multidetector CT imaging of the abdomen and pelvis was performed  following the standard protocol without IV contrast. COMPARISON:  Ultrasound of same day.  CT scan of February 28, 2018. FINDINGS: Lower chest: Mild bilateral posterior basilar subsegmental atelectasis or infiltrate is noted. Hepatobiliary: No focal liver abnormality is seen. No gallstones, gallbladder wall thickening, or biliary dilatation. Pancreas: Unremarkable. No pancreatic ductal dilatation or surrounding inflammatory changes. Spleen: Normal in size without focal abnormality. Adrenals/Urinary Tract: Adrenal glands appear normal. Right renal atrophy is noted. Bilateral nephrolithiasis is noted severe bilateral hydronephrosis is noted. No definite ureteral calculus is noted. There is seen a calcification just inferior to the right ureterovesical junction which was present on the prior exam and most likely represents phlebolith, not ureteral calculus. Probable diverticulum is seen arising from superior and right side of urinary bladder. Stomach/Bowel: The stomach appears normal. There is no evidence of bowel obstruction or inflammation. Diverticulosis is noted throughout the colon. The appendix is not clearly visualized. Vascular/Lymphatic: 5.3 cm infrarenal saccular abdominal aortic aneurysm is noted. Atherosclerosis of thoracic aorta is noted. No significant adenopathy is noted. Reproductive: Status post prostate surgery. Status post brachytherapy seed placement. Other: No ascites is noted. Mild fat containing periumbilical hernia is noted. Musculoskeletal: No acute or significant osseous findings. IMPRESSION: Bilateral nephrolithiasis is noted. Severe bilateral hydroureteronephrosis is noted without definite evidence of obstructing calculus. This is concerning for distal ureteral strictures. Status post prostatic brachytherapy seed placement and prostate surgery. 5.3 cm infrarenal abdominal aortic aneurysm. Recommend followup by abdomen and pelvis CTA in 3-6 months, and vascular surgery referral/consultation if  not already obtained. This recommendation follows ACR consensus guidelines: White Paper of the ACR Incidental Findings Committee II on Vascular Findings. J Am Coll Radiol 2013; 10:789-794. Aortic aneurysm NOS (ICD10-I71.9). Diverticulosis is noted throughout the colon without inflammation. Mild bilateral posterior basilar subsegmental atelectasis or infiltrates are noted. Aortic Atherosclerosis (ICD10-I70.0). Electronically Signed   By: Marijo Conception M.D.   On: 11/06/2018 14:27   Ct Head Wo Contrast  Result Date: 11/05/2018 CLINICAL DATA:  85 year old with possible seizure earlier today. Hypotension upon EMS arrival. Possibly postictal upon EMS arrival. EXAM: CT HEAD WITHOUT CONTRAST TECHNIQUE: Contiguous axial images were obtained from the base of the skull through the vertex without intravenous contrast. COMPARISON:  06/16/2014 and earlier. FINDINGS: Brain: Severe age related cortical, deep and cerebellar atrophy, progressive since 31. Moderate changes of small vessel disease of the white matter diffusely, also progressive. No mass lesion. No midline shift. No acute hemorrhage or hematoma. No extra-axial fluid collections. No evidence of acute infarction. Vascular: Severe BILATERAL carotid siphon and RIGHT vertebral artery atherosclerosis. No hyperdense vessel. Skull: No skull fracture or other focal osseous abnormality involving the skull. Sinuses/Orbits: Visualized paranasal sinuses, bilateral mastoid air cells and bilateral middle ear cavities well-aerated. Senile calcifications involving both globes. No significant abnormality involving the orbits or globes. Other: None. IMPRESSION: 1. No acute intracranial abnormality. 2. Severe age related generalized atrophy and moderate chronic microvascular ischemic changes of the white matter, progressive since 2015. Electronically Signed   By: Evangeline Dakin M.D.   On: 11/05/2018 16:07   US Renal  Result Date: 11/06/2018 CLINICAL DATA:  Acute renal  insufficiency. EXAM: RENAL / URINARY TRACT ULTRASOUND COMPLETE COMPARISON:  CT 07/16/2016 FINDINGS: Right Kidney: Renal measurements: 12.5 x 6.6 x 6.2 cm = volume: 268 mL .  Echogenicity within normal limits. No mass visualized. Moderate hydronephrosis which was also noted on previous CT. Left Kidney: Renal measurements: 13.7 x 7.2 x 6.1 cm = volume: 320 mL. Echogenicity within normal limits. No mass visualized. Moderate hydronephrosis which was also noted on previous CT. Bladder: Mild-to-moderately filled as patient unable to void. Patient also unable to move as there is mild echogenic material along the dependent portion of the bladder which may represent mild wall thickening versus echogenic dependent debris. IMPRESSION: Normal size kidneys with moderate bilateral chronic hydronephrosis. No focal mass or nephrolithiasis. Mild echogenic material along the dependent portion of the bladder which may be due to wall thickening versus dependent debris. Electronically Signed   By: Marin Olp M.D.   On: 11/06/2018 10:04   Dg Chest Port 1 View  Result Date: 11/05/2018 CLINICAL DATA:  85 year old with a seizure-like episode earlier today and now has excessive oral secretions. EXAM: PORTABLE CHEST 1 VIEW COMPARISON:  09/10/2017 and earlier. FINDINGS: Cardiac silhouette mildly enlarged for AP portable technique, unchanged. LEFT subclavian dual lead transvenous pacemaker unchanged. Thoracic aorta atherosclerotic, unchanged. Hilar and mediastinal contours otherwise unremarkable. Airspace consolidation involving the RIGHT lung base. Linear atelectasis involving the LEFT lung base. No pleural effusions. Pulmonary vascularity normal without evidence of pulmonary edema. IMPRESSION: 1. Acute pneumonia involving the RIGHT lung base. 2. Linear atelectasis involving the LEFT lung base. 3. Stable mild cardiomegaly without pulmonary edema. Electronically Signed   By: Evangeline Dakin M.D.   On: 11/05/2018 21:07    EKG:    Orders placed or performed during the hospital encounter of 11/05/18  . EKG 12-Lead  . EKG 12-Lead    ASSESSMENT AND PLAN:   85 year old male patient with history of hypertension, atrial fibrillation, permanent pacemaker, hyperlipidemia, diabetes, prostate cancer came in because of episode of shaking in the emergency room.  There was a question about possible seizure. #1 .possible seizure precipitated by UTI, renal failure: Patient cannot have MRI because of pacemaker.  Patient had EEG which showed no seizure activity.  Did not have any further shaking episodes since he is here.   2.   Acute renal failure with CKD stage III, creatinine of 2.8 in PCP office in January 2020.  On IV fluids because of acute renal failure but patient also has bilateral hydronephrosis, urology consult placed, spoke with Dr. Bernardo Heater today, he recommends continue Foley catheter, Pete ultrasound after several days of Foley catheter drainage, not planning to do a nephrostomy tube.  Will discontinue n.p.o. status, start on the diet.  3.  History of prostate cancer, patient is on Proscar 4.  UTI,; started on cefepime.  Follow-up urine cultures, added onto the specimen obtained in the emergency room. Essential hypertension, BP uncontrolled but relative bradycardia so decrease the dose of metoprolol, use hydralazine. Palliative care consult, follow-up with urology, vascular surgery consult placed because of infrarenal aortic aneurysm. All the records are reviewed and case discussed with Care Management/Social Workerr. Management plans discussed with the patient, family and they are in agreement. More than 50% time spent in counseling and coordination of care. CODE STATUS: full  TOTAL TIME TAKING CARE OF THIS PATIENT: 38 minutes.   POSSIBLE D/C IN 2-3 DAYS, DEPENDING ON CLINICAL CONDITION.   Epifanio Lesches M.D on 11/07/2018 at 9:46 AM  Between 7am to 6pm - Pager - [phone]  After 6pm go to [email or web address] -  password [email or web address] [facility]  Office  [phone]  CC: Primary care physician; [name],  MD   Note: This dictation was prepared with Dragon dictation along with smaller phrase technology. Any transcriptional errors that result from this process are unintentional.

## 2018-11-07 NOTE — Progress Notes (Signed)
Urology Consult Follow Up  Subjective: No complaints.  States he "feels the same".  Urine output yesterday 2050 mL.  Creatinine this morning slightly improved at 4.52. Urine culture grew multiple species.  Blood cultures negative x2 at 24 hours.  Anti-infectives: Anti-infectives (From admission, onward)   Start     Dose/Rate Route Frequency Ordered Stop   11/07/18 0600  vancomycin (VANCOCIN) IVPB 1000 mg/200 mL premix     1,000 mg 200 mL/hr over 60 Minutes Intravenous  Once 11/07/18 0543 11/07/18 0950   11/06/18 0100  ceFEPIme (MAXIPIME) 1 g in sodium chloride 0.9 % 100 mL IVPB  Status:  Discontinued     1 g 200 mL/hr over 30 Minutes Intravenous Every 8 hours 11/06/18 0032 11/06/18 0050   11/06/18 0100  ceFEPIme (MAXIPIME) 1 g in sodium chloride 0.9 % 100 mL IVPB     1 g 200 mL/hr over 30 Minutes Intravenous Every 24 hours 11/06/18 0050     11/06/18 0054  vancomycin variable dose per unstable renal function (pharmacist dosing)  Status:  Discontinued      Does not apply See admin instructions 11/06/18 0054 11/07/18 1709   11/06/18 0045  vancomycin (VANCOCIN) 2,000 mg in sodium chloride 0.9 % 500 mL IVPB     2,000 mg 250 mL/hr over 120 Minutes Intravenous  Once 11/06/18 0040 11/06/18 0341      Current Facility-Administered Medications  Medication Dose Route Frequency Provider Last Rate Last Dose  . 0.9 %  sodium chloride infusion   Intravenous Continuous Mayo, Pete Pelt, MD 75 mL/hr at 11/07/18 1731    . acetaminophen (TYLENOL) tablet 650 mg  650 mg Oral Q6H PRN Mayo, Pete Pelt, MD       Or  . acetaminophen (TYLENOL) suppository 650 mg  650 mg Rectal Q6H PRN Mayo, Pete Pelt, MD      . amLODipine (NORVASC) tablet 10 mg  10 mg Oral Daily Epifanio Lesches, MD   10 mg at 11/07/18 1120  . ceFEPIme (MAXIPIME) 1 g in sodium chloride 0.9 % 100 mL IVPB  1 g Intravenous Q24H Sela Hua, MD 200 mL/hr at 11/07/18 0650 1 g at 11/07/18 0650  . famotidine (PEPCID) tablet 20 mg  20 mg Oral  Daily Mayo, Pete Pelt, MD   20 mg at 11/07/18 1119  . finasteride (PROSCAR) tablet 5 mg  5 mg Oral Daily Mayo, Pete Pelt, MD   5 mg at 11/07/18 1120  . heparin injection 5,000 Units  5,000 Units Subcutaneous Q12H Rocky Morel, RPH   5,000 Units at 11/07/18 1119  . insulin aspart (novoLOG) injection 0-9 Units  0-9 Units Subcutaneous TID WC Mayo, Pete Pelt, MD   3 Units at 11/07/18 1726  . LORazepam (ATIVAN) injection 1 mg  1 mg Intravenous Q4H PRN Mayo, Pete Pelt, MD      . metoprolol tartrate (LOPRESSOR) tablet 12.5 mg  12.5 mg Oral BID Epifanio Lesches, MD   12.5 mg at 11/07/18 2022  . ondansetron (ZOFRAN) tablet 4 mg  4 mg Oral Q6H PRN Mayo, Pete Pelt, MD       Or  . ondansetron [facility]) injection 4 mg  4 mg Intravenous Q6H PRN Mayo, Pete Pelt, MD      . pantoprazole (PROTONIX) EC tablet 40 mg  40 mg Oral Daily Mayo, Pete Pelt, MD   40 mg at 11/07/18 1120  . polyethylene glycol (MIRALAX / GLYCOLAX) packet 17 g  17 g Oral Daily PRN Mayo, Pete Pelt,  MD      . rosuvastatin (CRESTOR) tablet 5 mg  5 mg Oral QHS Mayo, Pete Pelt, MD   5 mg at 11/07/18 2022  . sodium bicarbonate tablet 650 mg  650 mg Oral BID Sela Hua, MD   650 mg at 11/07/18 2022  . tamsulosin (FLOMAX) capsule 0.4 mg  0.4 mg Oral BID Sela Hua, MD   0.4 mg at 11/07/18 2022     Objective: Vital signs in last 24 hours: Temp:  [97.5 F (36.4 C)-98.2 F (36.8 C)] 97.5 F (36.4 C) (04/28 1919) Pulse Rate:  [49-104] 91 (04/28 2045) Resp:  [16-20] 16 (04/28 1919) BP: (111-171)/(75-109) 111/75 (04/28 1919) SpO2:  [89 %-100 %] 100 % (04/28 2045)  Intake/Output from previous day: 04/27 0701 - 04/28 0700 In: 794.4 [I.V.:794.4] Out: 2050 [Urine:2050] Intake/Output this shift: No intake/output data recorded.   Physical Exam: Alert, in no acute distress Foley catheter with a fair amount of sediment in catheter tubing.  No blood noted.  Lab Results:  Recent Labs    11/06/18 0055 11/07/18 1007  WBC 16.1*  10.0  HGB 9.2* 10.2*  HCT 28.7* 32.3*  PLT 115* 120*   BMET Recent Labs    11/06/18 0055 11/07/18 0450 11/07/18 1007  NA 136  --  142  K 4.1  --  4.0  CL 107  --  113*  CO2 16*  --  17*  GLUCOSE 157*  --  163*  BUN 82*  --  71*  CREATININE 5.25* 4.56* 4.52*  CALCIUM 7.9*  --  8.3*    Studies/Results: Ct Abdomen Pelvis Wo Contrast  Result Date: 11/06/2018 CLINICAL DATA:  Bilateral hydronephrosis. EXAM: CT ABDOMEN AND PELVIS WITHOUT CONTRAST TECHNIQUE: Multidetector CT imaging of the abdomen and pelvis was performed following the standard protocol without IV contrast. COMPARISON:  Ultrasound of same day.  CT scan of February 28, 2018. FINDINGS: Lower chest: Mild bilateral posterior basilar subsegmental atelectasis or infiltrate is noted. Hepatobiliary: No focal liver abnormality is seen. No gallstones, gallbladder wall thickening, or biliary dilatation. Pancreas: Unremarkable. No pancreatic ductal dilatation or surrounding inflammatory changes. Spleen: Normal in size without focal abnormality. Adrenals/Urinary Tract: Adrenal glands appear normal. Right renal atrophy is noted. Bilateral nephrolithiasis is noted severe bilateral hydronephrosis is noted. No definite ureteral calculus is noted. There is seen a calcification just inferior to the right ureterovesical junction which was present on the prior exam and most likely represents phlebolith, not ureteral calculus. Probable diverticulum is seen arising from superior and right side of urinary bladder. Stomach/Bowel: The stomach appears normal. There is no evidence of bowel obstruction or inflammation. Diverticulosis is noted throughout the colon. The appendix is not clearly visualized. Vascular/Lymphatic: 5.3 cm infrarenal saccular abdominal aortic aneurysm is noted. Atherosclerosis of thoracic aorta is noted. No significant adenopathy is noted. Reproductive: Status post prostate surgery. Status post brachytherapy seed placement. Other: No  ascites is noted. Mild fat containing periumbilical hernia is noted. Musculoskeletal: No acute or significant osseous findings. IMPRESSION: Bilateral nephrolithiasis is noted. Severe bilateral hydroureteronephrosis is noted without definite evidence of obstructing calculus. This is concerning for distal ureteral strictures. Status post prostatic brachytherapy seed placement and prostate surgery. 5.3 cm infrarenal abdominal aortic aneurysm. Recommend followup by abdomen and pelvis CTA in 3-6 months, and vascular surgery referral/consultation if not already obtained. This recommendation follows ACR consensus guidelines: White Paper of the ACR Incidental Findings Committee II on Vascular Findings. J Am Coll Radiol 2013; 10:789-794. Aortic aneurysm NOS (ICD10-I71.9).  Diverticulosis is noted throughout the colon without inflammation. Mild bilateral posterior basilar subsegmental atelectasis or infiltrates are noted. Aortic Atherosclerosis (ICD10-I70.0). Electronically Signed   By: Marijo Conception M.D.   On: 11/06/2018 14:27   US Renal  Result Date: 11/06/2018 CLINICAL DATA:  Acute renal insufficiency. EXAM: RENAL / URINARY TRACT ULTRASOUND COMPLETE COMPARISON:  CT 07/16/2016 FINDINGS: Right Kidney: Renal measurements: 12.5 x 6.6 x 6.2 cm = volume: 268 mL . Echogenicity within normal limits. No mass visualized. Moderate hydronephrosis which was also noted on previous CT. Left Kidney: Renal measurements: 13.7 x 7.2 x 6.1 cm = volume: 320 mL. Echogenicity within normal limits. No mass visualized. Moderate hydronephrosis which was also noted on previous CT. Bladder: Mild-to-moderately filled as patient unable to void. Patient also unable to move as there is mild echogenic material along the dependent portion of the bladder which may represent mild wall thickening versus echogenic dependent debris. IMPRESSION: Normal size kidneys with moderate bilateral chronic hydronephrosis. No focal mass or nephrolithiasis. Mild  echogenic material along the dependent portion of the bladder which may be due to wall thickening versus dependent debris. Electronically Signed   By: Marin Olp M.D.   On: 11/06/2018 10:04     Assessment: 1.  Acute on chronic renal failure-mild creatinine improvement with baseline creatinine of 2.5.    2.  Chronic bilateral hydronephrosis-present since at least 2018  3.  Recurrent UTI-urine culture grew multiple species  4.  Bilateral nonobstructing renal calculi  5.  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  Would continue Foley catheter drainage and follow creatinine Consider nephrostomy tube placement for rising creatinine.   LOS: 2 days    Abbie Sons 11/07/2018

## 2018-11-07 NOTE — Progress Notes (Signed)
Updated daughter via telephone. Madlyn Frankel, RN

## 2018-11-07 NOTE — Progress Notes (Signed)
Spoke to patient's daughter Blake [name] over the phone at [phone].  Update situation, she mentioned that the full code and requested to resuscitate if needed.

## 2018-11-08 ENCOUNTER — Encounter: Payer: Self-pay | Admitting: Primary Care

## 2018-11-08 DIAGNOSIS — N179 Acute kidney failure, unspecified: Secondary | ICD-10-CM

## 2018-11-08 DIAGNOSIS — Z7189 Other specified counseling: Secondary | ICD-10-CM

## 2018-11-08 DIAGNOSIS — Z515 Encounter for palliative care: Secondary | ICD-10-CM

## 2018-11-08 LAB — CREATININE, SERUM
Creatinine, Ser: 4.04 mg/dL — ABNORMAL HIGH (ref 0.61–1.24)
GFR calc Af Amer: 14 mL/min — ABNORMAL LOW (ref >60)
GFR calc non Af Amer: 12 mL/min — ABNORMAL LOW (ref >60)

## 2018-11-08 LAB — GLUCOSE, CAPILLARY
Glucose-Capillary: 119 mg/dL — ABNORMAL HIGH (ref 70–99)
Glucose-Capillary: 223 mg/dL — ABNORMAL HIGH (ref 70–99)
Glucose-Capillary: 154 mg/dL — ABNORMAL HIGH (ref 70–99)
Glucose-Capillary: 83 mg/dL (ref 70–99)

## 2018-11-08 MED ORDER — SODIUM CHLORIDE 0.9 % IV SOLN
2.0000 g | INTRAVENOUS | Status: DC
  Filled 2018-11-08: qty 2

## 2018-11-08 MED ORDER — DOCUSATE SODIUM 100 MG PO CAPS
100.0000 mg | ORAL_CAPSULE | Freq: Two times a day (BID) | ORAL | Status: DC
  Administered 2018-11-08 – 2018-11-09 (×3): 100 mg via ORAL
  Filled 2018-11-08 (×2): qty 1

## 2018-11-08 MED ORDER — CEPHALEXIN 250 MG PO CAPS
250.0000 mg | ORAL_CAPSULE | Freq: Two times a day (BID) | ORAL | Status: DC
  Administered 2018-11-08 – 2018-11-09 (×3): 250 mg via ORAL
  Filled 2018-11-08 (×4): qty 1

## 2018-11-08 NOTE — Progress Notes (Signed)
Physical Therapy Treatment Patient Details Name: Blake [name] MRN: [id number] DOB: 02-Jun-1923 Today's Date: 11/08/2018    History of Present Illness Blake [name] is a 85yo male who comes to [facility] on 4/26 c AMS after a 10-15 minute shaking episode at home, also an episode of emesis. PMH: HTN, HLD, DM2, PrCA, bladderCA c frequent UTI. Pt admieeted with seizure activity, also seen to have UTI. Pt is familiar to author from prior admissions.     PT Comments    Pt remains very close to baseline, still requires some orientation. Pt appears to have mild improvements in independence with bed mobility. AMB broken up into two bouts to allow for greater overall volume of AMB. Pt left up in chair at end of session, all needs met, RN from palliative medicine in room at exit.      Follow Up Recommendations  Home health PT;Supervision for mobility/OOB     Equipment Recommendations  None recommended by PT    Recommendations for Other Services       Precautions / Restrictions Precautions Precautions: Fall Restrictions Weight Bearing Restrictions: No    Mobility  Bed Mobility Overal bed mobility: Needs Assistance       Supine to sit: Mod assist     General bed mobility comments: improved trunk flexion strength this date  Transfers Overall transfer level: Needs assistance Equipment used: Rolling walker (2 wheeled) Transfers: Sit to/from Stand Sit to Stand: Min assist;From elevated surface         General transfer comment: moved to a 24" height surface, when given cues for hip hinge, only required minA for lift assist. Balance acquisition is delayed 1-2 seconds.   Ambulation/Gait   Gait Distance (Feet): (3f x2, seated rest break x3 minutes ) Assistive device: Rolling walker (2 wheeled) Gait Pattern/deviations: Step-to pattern;Trunk flexed     General Gait Details: 3-point step-to gait, completely lifting RW to move it; appears well established pattern. Similar ot prior  admissions; slight knee flexion maintained as per baseline   Stairs             Wheelchair Mobility    Modified Rankin (Stroke Patients Only)       Balance                                            Cognition Arousal/Alertness: Awake/alert Behavior During Therapy: WFL for tasks assessed/performed Overall Cognitive Status: Within Functional Limits for tasks assessed                                 General Comments: mild deficits noted, but back to baseline; needs to be oriented to location occasionally       Exercises Other Exercises Other Exercises: STS transfer training with multimodal cues for form, 5x in session. Eelvated surface to 24".     General Comments        Pertinent Vitals/Pain Pain Assessment: No/denies pain    Home Living                      Prior Function            PT Goals (current goals can now be found in the care plan section) Acute Rehab PT Goals Patient Stated Goal: return to home, regain strength  PT Goal Formulation:  With patient Time For Goal Achievement: 11/20/18 Potential to Achieve Goals: Good Progress towards PT goals: Progressing toward goals    Frequency    Min 2X/week      PT Plan Current plan remains appropriate    Co-evaluation              AM-PAC PT "6 Clicks" Mobility   Outcome Measure  Help needed turning from your back to your side while in a flat bed without using bedrails?: A Little Help needed moving from lying on your back to sitting on the side of a flat bed without using bedrails?: A Lot Help needed moving to and from a bed to a chair (including a wheelchair)?: A Lot Help needed standing up from a chair using your arms (e.g., wheelchair or bedside chair)?: A Lot Help needed to walk in hospital room?: A Lot Help needed climbing 3-5 steps with a railing? : Total 6 Click Score: 12    End of Session Equipment Utilized During Treatment: Gait  belt Activity Tolerance: Patient tolerated treatment well;Patient limited by fatigue Patient left: in chair;with call bell/phone within reach;with nursing/sitter in room(Palliative medicine rep in room )   PT Visit Diagnosis: Muscle weakness (generalized) (M62.81);Difficulty in walking, not elsewhere classified (R26.2);Other abnormalities of gait and mobility (R26.89)     Time: 5681-2751 PT Time Calculation (min) (ACUTE ONLY): 29 min  Charges:  $Therapeutic Exercise: 23-37 mins                     10:54 AM, 11/08/18 Etta Grandchild, PT, DPT Physical Therapist - [facility] [place]  [phone] ([place])     , C 11/08/2018, 10:52 AM

## 2018-11-08 NOTE — Progress Notes (Signed)
PHARMACY NOTE:  ANTIMICROBIAL RENAL DOSAGE ADJUSTMENT  Current antimicrobial regimen includes a mismatch between antimicrobial dosage and estimated renal function.  As per policy approved by the Pharmacy & Therapeutics and Medical Executive Committees, the antimicrobial dosage will be adjusted accordingly.  Current antimicrobial dosage:  Cefepime 1g q 24h  Indication: PNA  Renal Function:  Estimated Creatinine Clearance: 14.3 mL/min (A) (by C-G formula based on SCr of 4.04 mg/dL (H)).    Antimicrobial dosage has been changed to:  Cefepime 2g IV q 24hrs  Additional comments: Pharmacy will continue to monitor and adjust antibiotics for renal function in this pt   Thank you for allowing pharmacy to be a part of this patient's care.  Lu Duffel, PharmD, BCPS Clinical Pharmacist 11/08/2018 8:00 AM

## 2018-11-08 NOTE — Progress Notes (Signed)
11/08/18  Urology consult f/u  No complaints.  Sitting in chair.  Foley in place.  Creatinine continues to trend down (4.04) but not back to baseline.  Please see Dr. Dene Gentry note from yesterday, no new recommendations or changes in patient care  Hollice Espy, MD

## 2018-11-08 NOTE — Care Management Important Message (Signed)
Important Message  Patient Details  Name: Blake [name] MRN: [id number] Date of Birth: Jan 19, 1923   Medicare Important Message Given:  Yes    Juliann Pulse A Shannell Mikkelsen 11/08/2018, 8:47 AM

## 2018-11-08 NOTE — Progress Notes (Signed)
[place] [facility] [place] NAME: Hamzah Savoca    MR#:  [id number]  DATE OF BIRTH:  [birth year]    CHIEF COMPLAINT:   Chief Complaint  Patient presents with  . Altered Mental Status   Sitting in the chair. No concerns   REVIEW OF SYSTEMS:   ROS CONSTITUTIONAL: No fever EYES: No blurred or double vision.  EARS, NOSE, AND THROAT: No tinnitus or ear pain.  RESPIRATORY: No cough, shortness of breath, wheezing or hemoptysis.  CARDIOVASCULAR: No chest pain, orthopnea, edema.  GASTROINTESTINAL: No nausea, vomiting, diarrhea or abdominal pain.  GENITOURINARY: No dysuria, hematuria.  ENDOCRINE: No polyuria, nocturia,  HEMATOLOGY: No anemia, easy bruising or bleeding SKIN: No rash or lesion. MUSCULOSKELETAL: No joint pain or arthritis.   NEUROLOGIC: No tingling, numbness, weakness.  PSYCHIATRY: No anxiety or depression.   DRUG ALLERGIES:   Allergies  Allergen Reactions  . Diovan [Valsartan] Cough  . Gabapentin Other (See Comments)    Sedation at all doses  . Hydralazine Itching  . Lisinopril Cough  . Pregabalin Other (See Comments)    Sedation at all doses  . Levofloxacin Other (See Comments)    Too many side effects. Nausea, vomiting, upset stomach, increased confusion, etc Other reaction(s): Confusion Nausea, chills Nausea, chills   . Sulfamethoxazole-Trimethoprim Other (See Comments)    Too many side effects. Nausea, vomiting, upset stomach, increased confusion, etc    VITALS:  Blood pressure (!) 153/91, pulse (!) 105, temperature 98.2 F (36.8 C), temperature source Oral, resp. rate 16, height 6\' 2"  (1.88 m), weight 98.8 kg, SpO2 99 %.  PHYSICAL EXAMINATION:  GENERAL:  85 y.o.-year-old patient lying in the bed with no acute distress.  EYES: Pupils equal, round, reactive to light and accommodation. No scleral icterus. Extraocular muscles intact.  HEENT: Head atraumatic, normocephalic. Oropharynx and nasopharynx clear.   NECK:  Supple, no jugular venous distention. No thyroid enlargement, no tenderness.  LUNGS: Normal breath sounds bilaterally, no wheezing, rales,rhonchi or crepitation. No use of accessory muscles of respiration.  CARDIOVASCULAR: S1, S2 normal. No murmurs, rubs, or gallops.  ABDOMEN: Soft, nontender, nondistended. Bowel sounds present. No organomegaly or mass. Foley in place  EXTREMITIES: No pedal edema, cyanosis, or clubbing.  NEUROLOGIC: Cranial nerves II through XII are intact. Muscle strength 5/5 in all extremities. Sensation intact. Gait not checked.  PSYCHIATRIC: The patient is alert and awake SKIN: No obvious rash, lesion, or ulcer.    LABORATORY PANEL:   CBC Recent Labs  Lab 11/07/18 1007  WBC 10.0  HGB 10.2*  HCT 32.3*  PLT 120*   ------------------------------------------------------------------------------------------------------------------  Chemistries  Recent Labs  Lab 11/05/18 1530  11/07/18 1007 11/08/18 0441  NA 134*   < > 142  --   K 3.8   < > 4.0  --   CL 102   < > 113*  --   CO2 16*   < > 17*  --   GLUCOSE 180*   < > 163*  --   BUN 86*   < > 71*  --   CREATININE 5.81*   < > 4.52* 4.04*  CALCIUM 8.1*   < > 8.3*  --   AST 16  --   --   --   ALT 15  --   --   --   ALKPHOS 75  --   --   --   BILITOT 0.8  --   --   --    < > =  values in this interval not displayed.   ------------------------------------------------------------------------------------------------------------------  Cardiac Enzymes No results for input(s): TROPONINI in the last 168 hours. ------------------------------------------------------------------------------------------------------------------  RADIOLOGY:  Ct Abdomen Pelvis Wo Contrast  Result Date: 11/06/2018 CLINICAL DATA:  Bilateral hydronephrosis. EXAM: CT ABDOMEN AND PELVIS WITHOUT CONTRAST TECHNIQUE: Multidetector CT imaging of the abdomen and pelvis was performed following the standard protocol without IV contrast.  COMPARISON:  Ultrasound of same day.  CT scan of February 28, 2018. FINDINGS: Lower chest: Mild bilateral posterior basilar subsegmental atelectasis or infiltrate is noted. Hepatobiliary: No focal liver abnormality is seen. No gallstones, gallbladder wall thickening, or biliary dilatation. Pancreas: Unremarkable. No pancreatic ductal dilatation or surrounding inflammatory changes. Spleen: Normal in size without focal abnormality. Adrenals/Urinary Tract: Adrenal glands appear normal. Right renal atrophy is noted. Bilateral nephrolithiasis is noted severe bilateral hydronephrosis is noted. No definite ureteral calculus is noted. There is seen a calcification just inferior to the right ureterovesical junction which was present on the prior exam and most likely represents phlebolith, not ureteral calculus. Probable diverticulum is seen arising from superior and right side of urinary bladder. Stomach/Bowel: The stomach appears normal. There is no evidence of bowel obstruction or inflammation. Diverticulosis is noted throughout the colon. The appendix is not clearly visualized. Vascular/Lymphatic: 5.3 cm infrarenal saccular abdominal aortic aneurysm is noted. Atherosclerosis of thoracic aorta is noted. No significant adenopathy is noted. Reproductive: Status post prostate surgery. Status post brachytherapy seed placement. Other: No ascites is noted. Mild fat containing periumbilical hernia is noted. Musculoskeletal: No acute or significant osseous findings. IMPRESSION: Bilateral nephrolithiasis is noted. Severe bilateral hydroureteronephrosis is noted without definite evidence of obstructing calculus. This is concerning for distal ureteral strictures. Status post prostatic brachytherapy seed placement and prostate surgery. 5.3 cm infrarenal abdominal aortic aneurysm. Recommend followup by abdomen and pelvis CTA in 3-6 months, and vascular surgery referral/consultation if not already obtained. This recommendation follows ACR  consensus guidelines: White Paper of the ACR Incidental Findings Committee II on Vascular Findings. J Am Coll Radiol 2013; 10:789-794. Aortic aneurysm NOS (ICD10-I71.9). Diverticulosis is noted throughout the colon without inflammation. Mild bilateral posterior basilar subsegmental atelectasis or infiltrates are noted. Aortic Atherosclerosis (ICD10-I70.0). Electronically Signed   By: Marijo Conception M.D.   On: 11/06/2018 14:27    EKG:   Orders placed or performed during the hospital encounter of 11/05/18  . EKG 12-Lead  . EKG 12-Lead    ASSESSMENT AND PLAN:   85 year old male patient with history of hypertension, atrial fibrillation, permanent pacemaker, hyperlipidemia, diabetes, prostate cancer came in because of episode of shaking in the emergency room.  There was a question about possible seizure.  *  Acute renal failure with CKD stage III, creatinine of 2.8 in PCP office in January 2020.   On IV fluids because of acute renal failure Foley placed due to bilateral hydronephrosis  Repeat ultrasound after several days of Foley catheter drainage.  * Possible seizure precipitated by UTI, renal failure: Patient cannot have MRI because of pacemaker.  Patient had EEG which showed no seizure activity.  Did not have any further shaking episodes since here.    *  History of prostate cancer, patient is on Proscar  * UTI,; started on cefepime. Change to keflex for 2 more days  * Essential hypertension, BP uncontrolled but relative bradycardia so decrease the dose of metoprolol, use hydralazine.   * Infrarenal aortic aneurysm. Appreciate vascular input. Known. No surgeries.  All the records are reviewed and case discussed with Care Management/Social  Worker  CODE STATUS: full  TOTAL TIME TAKING CARE OF THIS PATIENT: 30 minutes.   POSSIBLE D/C IN 1-2 DAYS, DEPENDING ON CLINICAL CONDITION.  Leia Alf Eleina Jergens M.D on 11/08/2018 at 12:16 PM  Between 7am to 6pm - Pager - [phone]  After 6pm  go to [email or web address] - password [email or web address] [facility]  Office  [phone]  CC: Primary care physician; Adin Hector, MD   Note: This dictation was prepared with Dragon dictation along with smaller phrase technology. Any transcriptional errors that result from this process are unintentional.

## 2018-11-08 NOTE — Consult Note (Signed)
Consultation Note Date: 11/08/2018   Patient Name: Blake [name]  DOB: 1923/02/25  MRN: [id number]  Age / Sex: 85 y.o., male  PCP: Adin Hector, MD Referring Physician: Hillary Bow, MD  Reason for Consultation: Establishing goals of care  HPI/Patient Profile: 85 y.o. male  with past medical history of  prostate cancer, HTN, cholest, pacer, Acute/chronic renal failure, baseline creatinine 2.5, Chronic BL hydronephrosis-since 2018, Recurrent UTI- multiple species, BL nonobstructing renal calculi, Urothelial carcinoma, history incompletely resected tumor, heart murmur, diabetes, and known history of abdominal aortic aneurysm (followed by vas sgr).   admitted on 11/05/2018 with possible seizure precipitated by UTI, renal failure.   Clinical Assessment and Goals of Care: Blake [name] is working with physical therapy.  He is walking in the room, doing well.  After he is settled in the [place] chair Blake [name] and I talk.  He does not see well, and therefore does not make eye contact, but he is alert and oriented to person and place with only mild confusion about the month.  There is no family at bedside at this time due to visitor restrictions.  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  He tells me that his daughter is a Pharmacist, community and his sons (or grandsons) are doctors.  We talked about his UTI and the treatment plan.  We talked about home with home health care.  Again Blake [name] has no questions or concerns about his treatment plan.  He tells me that his family helps care for him.  We talked about healthcare power of attorney, see below. We talked about CODE STATUS, see below.  Conference with physical therapy and case management related to patient needs, disposition.  HCPOA NEXT OF KIN -Blake [name] tells me that his daughter, Blake [name], is his Ambulance person.  He also tells me that he has 2 sons who also  participate in decision-making.   SUMMARY OF RECOMMENDATIONS   Continue full scope/full code Continue to rehospitalize as needed  Code Status/Advance Care Planning:  Full code - We talk about life support in a nonthreatening way.  Blake [name] shares that his daughter makes those choices for him.  Symptom Management:   Per hospitalist, no additional needs at this time.  Palliative Prophylaxis:   Oral Care  Additional Recommendations (Limitations, Scope, Preferences):  Full Scope Treatment  Psycho-social/Spiritual:   Desire for further Chaplaincy support:no  Additional Recommendations: Caregiving  Support/Resources  Prognosis:   Unable to determine, based on outcomes   Discharge Planning: home with home health already in place      Primary Diagnoses: Present on Admission: **None**   I have reviewed the medical record, interviewed the patient and family, and examined the patient. The following aspects are pertinent.  Past Medical History:  Diagnosis Date  . AAA (abdominal aortic aneurysm) ([place])   . Arrhythmia   . Diabetes mellitus without complication ([place])   . GI bleed   . Heart murmur   . Hyperlipidemia   . Hypertension   .  Prostate cancer [facility])    Social History   Socioeconomic History  . Marital status: Married    Spouse name: Not on file  . Number of children: Not on file  . Years of education: Not on file  . Highest education level: Not on file  Occupational History  . Not on file  Social Needs  . Financial resource strain: Not on file  . Food insecurity:    Worry: Not on file    Inability: Not on file  . Transportation needs:    Medical: Not on file    Non-medical: Not on file  Tobacco Use  . Smoking status: Former Research scientist (life sciences)  . Smokeless tobacco: Never Used  Substance and Sexual Activity  . Alcohol use: No  . Drug use: No  . Sexual activity: Not Currently  Lifestyle  . Physical activity:    Days per week: Not on file    Minutes per  session: Not on file  . Stress: Not on file  Relationships  . Social connections:    Talks on phone: Not on file    Gets together: Not on file    Attends religious service: Not on file    Active member of club or organization: Not on file    Attends meetings of clubs or organizations: Not on file    Relationship status: Not on file  Other Topics Concern  . Not on file  Social History Narrative  . Not on file   Family History  Problem Relation Age of Onset  . Hypertension Other   . Stroke Other   . Heart attack Other   . Stroke Sister   . Heart attack Brother    Scheduled Meds: . amLODipine  10 mg Oral Daily  . famotidine  20 mg Oral Daily  . finasteride  5 mg Oral Daily  . heparin injection (subcutaneous)  5,000 Units Subcutaneous Q12H  . insulin aspart  0-9 Units Subcutaneous TID WC  . metoprolol tartrate  12.5 mg Oral BID  . pantoprazole  40 mg Oral Daily  . rosuvastatin  5 mg Oral QHS  . sodium bicarbonate  650 mg Oral BID  . tamsulosin  0.4 mg Oral BID   Continuous Infusions: . sodium chloride 75 mL/hr at 11/07/18 1731  . [START ON 11/09/2018] ceFEPime (MAXIPIME) IV     PRN Meds:.acetaminophen **OR** acetaminophen, LORazepam, ondansetron **OR** ondansetron (ZOFRAN) IV, polyethylene glycol Medications Prior to Admission:  Prior to Admission medications   Medication Sig Start Date End Date Taking? Authorizing Provider  cefUROXime (CEFTIN) 250 MG tablet Take 250 mg by mouth 2 (two) times daily with a meal.   Yes [provider]  Cephalexin 500 MG tablet Take 1 tablet (500 mg total) by mouth daily. 09/06/18  Yes Stoioff, Ronda Fairly, MD  finasteride (PROSCAR) 5 MG tablet Take 5 mg by mouth daily.   Yes [provider]  glimepiride (AMARYL) 2 MG tablet 2 tablets in the morning and one tablet at night Patient taking differently: Take 4 mg by mouth 2 (two) times daily.  04/15/15  Yes [name], MD  Iron-Vitamin C (VITRON-C PO) Take 1 tablet by mouth  daily.   Yes [provider]  metoprolol tartrate 37.5 MG TABS Take 37.5 mg by mouth 2 (two) times daily. Patient taking differently: Take 25 mg by mouth 2 (two) times daily.  09/13/17  Yes Gladstone Lighter, MD  Multiple Vitamins-Minerals (PRESERVISION/LUTEIN PO) Take 1 capsule by mouth 2 (two) times  daily.   Yes [provider]  multivitamin-iron-minerals-folic acid (CENTRUM) chewable tablet Chew 2 tablets by mouth daily.   Yes [provider]  pantoprazole (PROTONIX) 40 MG tablet Take 40 mg by mouth daily.   Yes [provider]  rosuvastatin (CRESTOR) 5 MG tablet Take 5 mg by mouth at bedtime.   Yes [provider]  sitaGLIPtin (JANUVIA) 50 MG tablet Take 50 mg by mouth daily.   Yes [provider]  sodium bicarbonate 650 MG tablet Take 1 tablet (650 mg total) by mouth 2 (two) times daily. 09/13/17  Yes Gladstone Lighter, MD  tamsulosin (FLOMAX) 0.4 MG CAPS capsule Take 0.4 mg by mouth daily.    Yes [provider]  doxycycline (VIBRAMYCIN) 100 MG capsule Take 1 capsule (100 mg total) by mouth every 12 (twelve) hours. Patient not taking: Reported on 11/05/2018 07/28/18   Abbie Sons, MD  doxycycline (VIBRAMYCIN) 100 MG capsule Take 1 capsule (100 mg total) by mouth every 12 (twelve) hours. Patient not taking: Reported on 11/05/2018 07/28/18   Abbie Sons, MD   Allergies  Allergen Reactions  . Diovan [Valsartan] Cough  . Gabapentin Other (See Comments)    Sedation at all doses  . Hydralazine Itching  . Lisinopril Cough  . Pregabalin Other (See Comments)    Sedation at all doses  . Levofloxacin Other (See Comments)    Too many side effects. Nausea, vomiting, upset stomach, increased confusion, etc Other reaction(s): Confusion Nausea, chills Nausea, chills   . Sulfamethoxazole-Trimethoprim Other (See Comments)    Too many side effects. Nausea, vomiting, upset stomach, increased confusion, etc   Review of Systems   Unable to perform ROS: Age    Physical Exam Vitals signs and nursing note reviewed.  Constitutional:      General: He is not in acute distress.    Appearance: He is not ill-appearing.     Comments: Visually and hearing impaired, calm and cooperative, relatively strong for age  HENT:     Head: Atraumatic.  Cardiovascular:     Rate and Rhythm: Normal rate.  Pulmonary:     Effort: Pulmonary effort is normal. No respiratory distress.  Abdominal:     General: Abdomen is flat. There is no distension.  Musculoskeletal:        General: No swelling.  Skin:    General: Skin is warm and dry.  Neurological:     Mental Status: He is alert.     Comments: Oriented to person and place, some mild confusion about a month  Psychiatric:        Mood and Affect: Mood normal.        Behavior: Behavior normal.     Vital Signs: BP (!) 153/91 (BP Location: Right Arm)   Pulse (!) 105   Temp 98.2 F (36.8 C) (Oral)   Resp 16   Ht 6\' 2"  (1.88 m)   Wt 98.8 kg   SpO2 100%   BMI 27.97 kg/m  Pain Scale: 0-10 POSS *See Group Information*: 2-Acceptable,Slightly drowsy, easily aroused Pain Score: 0-No pain   SpO2: SpO2: 100 % O2 Device:SpO2: 100 % O2 Flow Rate: .   IO: Intake/output summary:   Intake/Output Summary (Last 24 hours) at 11/08/2018 0949 Last data filed at 11/08/2018 0505 Gross per 24 hour  Intake 3311.77 ml  Output 2400 ml  Net 911.77 ml    LBM: Last BM Date: 11/04/18 Baseline Weight: Weight: 98.9 kg Most recent weight: Weight: 98.8 kg  Palliative Assessment/Data:   Flowsheet Rows     Most Recent Value  Intake Tab  Referral Department  Hospitalist  Unit at Time of Referral  Med/Surg Unit  Palliative Care Primary Diagnosis  Nephrology  Date Notified  11/07/18  Palliative Care Type  New Palliative care  Reason for referral  Clarify Goals of Care  Date of Admission  11/05/18  Date first seen by Palliative Care  11/08/18  # of days Palliative referral response time   1 Day(s)  # of days IP prior to Palliative referral  2  Clinical Assessment  Palliative Performance Scale Score  40%  Pain Max last 24 hours  Not able to report  Pain Min Last 24 hours  Not able to report  Dyspnea Max Last 24 Hours  Not able to report  Dyspnea Min Last 24 hours  Not able to report  Psychosocial & Spiritual Assessment  Palliative Care Outcomes      Time In: 0930 Time Out: 1020 Time Total: 50 minutes Greater than 50%  of this time was spent counseling and coordinating care related to the above assessment and plan.  Signed by: Drue Novel, NP   Please contact Palliative Medicine Team phone at [phone] for questions and concerns.  For individual provider: See Shea Evans

## 2018-11-09 DIAGNOSIS — N179 Acute kidney failure, unspecified: Principal | ICD-10-CM

## 2018-11-09 LAB — GLUCOSE, CAPILLARY
Glucose-Capillary: 80 mg/dL (ref 70–99)
Glucose-Capillary: 163 mg/dL — ABNORMAL HIGH (ref 70–99)

## 2018-11-09 LAB — BASIC METABOLIC PANEL
Anion gap: 9 (ref 5–15)
BUN: 58 mg/dL — ABNORMAL HIGH (ref 8–23)
CO2: 19 mmol/L — ABNORMAL LOW (ref 22–32)
Calcium: 8.2 mg/dL — ABNORMAL LOW (ref 8.9–10.3)
Chloride: 116 mmol/L — ABNORMAL HIGH (ref 98–111)
Creatinine, Ser: 3.86 mg/dL — ABNORMAL HIGH (ref 0.61–1.24)
GFR calc Af Amer: 15 mL/min — ABNORMAL LOW (ref >60)
GFR calc non Af Amer: 13 mL/min — ABNORMAL LOW (ref >60)
Glucose, Bld: 91 mg/dL (ref 70–99)
Potassium: 3.4 mmol/L — ABNORMAL LOW (ref 3.5–5.1)
Sodium: 144 mmol/L (ref 135–145)

## 2018-11-09 MED ORDER — POTASSIUM CHLORIDE CRYS ER 20 MEQ PO TBCR
40.0000 meq | EXTENDED_RELEASE_TABLET | Freq: Once | ORAL | Status: AC
  Administered 2018-11-09: 08:00:00 40 meq via ORAL
  Filled 2018-11-09: qty 2

## 2018-11-09 MED ORDER — SODIUM CHLORIDE 0.45 % IV SOLN
INTRAVENOUS | Status: DC
  Administered 2018-11-09: 08:00:00 via INTRAVENOUS

## 2018-11-09 MED ORDER — MORPHINE SULFATE (CONCENTRATE) 10 MG/0.5ML PO SOLN: 2.6000 mg | ORAL | Status: DC

## 2018-11-09 MED ORDER — METOPROLOL TARTRATE 50 MG PO TABS
50.0000 mg | ORAL_TABLET | Freq: Two times a day (BID) | ORAL | Status: DC
  Administered 2018-11-09: 50 mg via ORAL
  Filled 2018-11-09: qty 1

## 2018-11-09 MED ORDER — AMLODIPINE BESYLATE 2.5 MG PO TABS: 2.5000 mg | ORAL_TABLET | Freq: Every day | ORAL | 0 refills | Status: DC

## 2018-11-09 MED ORDER — CEFPODOXIME PROXETIL 200 MG PO TABS: 200.0000 mg | ORAL_TABLET | Freq: Two times a day (BID) | ORAL | Status: AC

## 2018-11-09 NOTE — TOC Progression Note (Signed)
Transition of Care [facility]) - Progression Note    Patient Details  Name: Blake [name] MRN: [id number] Date of Birth: 14-Apr-1923  Transition of Care [facility]) CM/SW [place], RN Phone Number: 11/09/2018, 10:29 AM  Clinical Narrative:     Called to speak with the daughter about the patient discharging, she will not be available until 1 PM, The patient's wife answered and I Spoke to her abut the DC, she was happy that he will come home today, I explained he will go home with a Foley Catheter, she stated understanding Asked if Nurse would call back after 1 and talk to the daughter Brunetta Genera  Expected Discharge Plan: [place] Barriers to Discharge: Continued Medical Work up  Expected Discharge Plan and Services Expected Discharge Plan: [place] Choice: [place] arrangements for the past 2 months: Single Family Home Expected Discharge Date: 11/09/18                         HH Arranged: RN, PT [facility] Agency: [place] ([place]) Date HH Agency Contacted: 11/07/18 Time [place]: 1025 Representative spoke with at [place]: [place] ([place]) Interventions    Readmission Risk Interventions Readmission Risk Prevention Plan 11/06/2018  Transportation Screening Complete  Some recent data might be hidden

## 2018-11-09 NOTE — Progress Notes (Signed)
Patient discharged home. Lillyn Wieczorek S, RN  

## 2018-11-09 NOTE — Progress Notes (Signed)
Physical Therapy Treatment Patient Details Name: Blake [name] MRN: [id number] DOB: [birth year] Today's Date: 11/09/2018    History of Present Illness Blake [name] is a 85yo male who comes to [facility] on 4/26 c AMS after a 10-15 minute shaking episode at home, also an episode of emesis. PMH: HTN, HLD, DM2, PrCA, bladderCA c frequent UTI. Pt admieeted with seizure activity, also seen to have UTI. Pt is familiar to author from prior admissions.     PT Comments    Patient is progressing slowly; able to complete sitting edge of bed LE exercise today with better flexibility and LE movement. Patient does require increased time to complete tasks including transfers and gait. Patient very slow with gait training with forward flexed postured and short shuffled steps. He requires increased cues for erect posture, increase step length and to improve foot clearance. Patient would benefit from additional skilled PT intervention to improve strength, balance and gait safety; Patient left in chair with chair alarm on, needs in reach;    Follow Up Recommendations  Home health PT;Supervision for mobility/OOB     Equipment Recommendations  None recommended by PT    Recommendations for Other Services       Precautions / Restrictions Precautions Precautions: Fall Restrictions Weight Bearing Restrictions: No    Mobility  Bed Mobility   Bed Mobility: Supine to Sit;Sit to Supine     Supine to sit: Min assist Sit to supine: Min assist   General bed mobility comments: cues for hand placement and to initiate LE movement; requires increased time;   Transfers Overall transfer level: Needs assistance Equipment used: Rolling walker (2 wheeled) Transfers: Sit to/from Stand Sit to Stand: Min assist;From elevated surface         General transfer comment: with cues for hand placement and to increase forward lean for better transfer ability; requires increased time; x1 rep from bed, x1 rep to bedside chair;    Ambulation/Gait Ambulation/Gait assistance: Min guard Gait Distance (Feet): 50 Feet Assistive device: Rolling walker (2 wheeled) Gait Pattern/deviations: Step-to pattern;Trunk flexed     General Gait Details: very slow gait pattern, forward flexed posture, short step length with decreased foot clearance; requires min VCs to increase erect posture, increase step length and increase ankle DF at heel strike for better heel/toe gait pattern; Patient requires significantly increased time and often will stop and stand to rest during ambulation; very slow with turns; overall exhibits good safety awareness with RW placement and positioning.    Stairs             Wheelchair Mobility    Modified Rankin (Stroke Patients Only)       Balance Overall balance assessment: Needs assistance Sitting-balance support: No upper extremity supported;Feet supported Sitting balance-Leahy Scale: Fair     Standing balance support: During functional activity Standing balance-Leahy Scale: Poor Standing balance comment: needs stabilization assist when transferring and during ambulation;                             Cognition Arousal/Alertness: Awake/alert Behavior During Therapy: WFL for tasks assessed/performed Overall Cognitive Status: Within Functional Limits for tasks assessed                                 General Comments: mild deficits noted, but back to baseline; needs to be oriented to location occasionally  Exercises Other Exercises Other Exercises: Instructed patient in LE strengthening/ROM exercise to improve flexibility: sitting edge of bed: hip flexion march x10 reps, LAQ x10 reps, ankle DF x10 reps; standing with RW heel raises x10 reps bilaterally; required mod VCs to increase ROM for better flexibility and strength; Required min VCs for correct exercise technique especially with standing heel raises as patient slightly confused; (x10 min)     General Comments        Pertinent Vitals/Pain Pain Assessment: No/denies pain    Home Living                      Prior Function            PT Goals (current goals can now be found in the care plan section) Acute Rehab PT Goals Patient Stated Goal: return to home, regain strength  PT Goal Formulation: With patient Time For Goal Achievement: 11/20/18 Potential to Achieve Goals: Good Progress towards PT goals: Progressing toward goals    Frequency    Min 2X/week      PT Plan Current plan remains appropriate    Co-evaluation              AM-PAC PT "6 Clicks" Mobility   Outcome Measure  Help needed turning from your back to your side while in a flat bed without using bedrails?: A Little Help needed moving from lying on your back to sitting on the side of a flat bed without using bedrails?: A Lot Help needed moving to and from a bed to a chair (including a wheelchair)?: A Little Help needed standing up from a chair using your arms (e.g., wheelchair or bedside chair)?: A Little Help needed to walk in hospital room?: A Little Help needed climbing 3-5 steps with a railing? : Total 6 Click Score: 15    End of Session Equipment Utilized During Treatment: Gait belt Activity Tolerance: Patient tolerated treatment well;Patient limited by fatigue Patient left: in chair;with call bell/phone within reach;with nursing/sitter in room(Palliative medicine rep in room )   PT Visit Diagnosis: Muscle weakness (generalized) (M62.81);Difficulty in walking, not elsewhere classified (R26.2);Other abnormalities of gait and mobility (R26.89)     Time: 5830-9407 PT Time Calculation (min) (ACUTE ONLY): 34 min  Charges:  $Gait Training: 8-22 mins $Therapeutic Exercise: 8-22 mins                        Trotter,Margaret PT, DPT 11/09/2018, 11:30 AM

## 2018-11-09 NOTE — Progress Notes (Signed)
Urology consult follow-up  Doing well, no complaints this morning.  Specifically denies flank or abdominal pain.  Foley draining clear yellow urine  Creatinine slowly downtrending with Foley, today 3.86(4.04)(4.52)(5.25).  Baseline is 2.5.  Urine culture with multiple species present, blood cultures no growth  Recommendations: Continue antibiotics for presumed UTI Maintain Foley We will arrange follow-up in ~1 week with Dr. Bernardo Heater for void trial  Discussed above with daughter  Nickolas Madrid, MD 11/09/2018

## 2018-11-09 NOTE — TOC Transition Note (Signed)
Transition of Care [facility]) - CM/SW Discharge Note   Patient Details  Name: Blake [name] MRN: [id number] Date of Birth: May 15, 1923  Transition of Care [facility]) CM/SW Contact:  Su Hilt, RN Phone Number: 11/09/2018, 10:32 AM   Clinical Narrative:     Patient to discharge home with Home health services already arranged [facility], will have RN and PT requested Encarnacion Slates PT, will go home with a Foley Daughter Brunetta Genera Crips will be available after 1 PM for Transport home [place] with [facility] notified of the DC today   Final next level of care: Home w Home Health Services Barriers to Discharge: Barriers Resolved   Patient Goals and CMS Choice Patient states their goals for this hospitalization and ongoing recovery are:: Return home with [facility] PT CMS Medicare.gov Compare Post Acute Care list provided to:: Patient Represenative (must comment)(daughter) Choice offered to / list presented to : Patient, Adult Children  Discharge Placement                  Name of family member notified: Mrs. Laurance Flatten, Patient's wfe Patient and family notified of of transfer: 11/09/18  Discharge Plan and Services     Post Acute Care Choice: [place] Arranged: RN, PT [facility] Agency: [place] ([place]) Date [place]: 11/07/18 Time [place]: 1025 Representative spoke with at [place]: [place] ([place]) Interventions     Readmission Risk Interventions Readmission Risk Prevention Plan 11/06/2018  Transportation Screening Complete  Some recent data might be hidden

## 2018-11-09 NOTE — Progress Notes (Signed)
Discharge instructions given and went over with patient at bedside and with daughter over the phone. All questions answered. Patient to discharge home. Awaiting daughter to provide transportation. Madlyn Frankel, RN

## 2018-11-11 LAB — CULTURE, BLOOD (ROUTINE X 2)
Culture: NO GROWTH
Culture: NO GROWTH
Special Requests: ADEQUATE
Special Requests: ADEQUATE

## 2018-11-11 NOTE — Discharge Summary (Signed)
[facility] NAME: Blake [name]    MR#:  [id number]  DATE OF BIRTH:  1922/11/27  DATE OF ADMISSION:  11/05/2018 ADMITTING PHYSICIAN: Sela Hua, MD  DATE OF DISCHARGE: 11/09/2018  2:27 PM  PRIMARY CARE PHYSICIAN: [name], MD   ADMISSION DIAGNOSIS:  Weakness [R53.1] AKI (acute kidney injury) ([place]) [N17.9] Seizure-like activity ([place]) [R56.9]  DISCHARGE DIAGNOSIS:  Active Problems:   Seizure-like activity ([place])   AKI (acute kidney injury) ([place])   Goals of care, counseling/discussion   Palliative care by specialist   DNR (do not resuscitate) discussion   SECONDARY DIAGNOSIS:   Past Medical History:  Diagnosis Date  . AAA (abdominal aortic aneurysm) ([place])   . Arrhythmia   . Diabetes mellitus without complication ([place])   . GI bleed   . Heart murmur   . Hyperlipidemia   . Hypertension   . Prostate cancer [facility])      ADMITTING HISTORY  Blake [name]  is a 85 y.o. male with a known history of hypertension, hyperlipidemia, type 2 diabetes, hx of prostate cancer who presented to the ED with a seizure-like episode today.  Patient is little bit confused and drowsy in the ED, so history obtained from the ED physician and the patient's daughter via the phone.  This morning, he had an episode of generalized shaking of all of his limbs that lasted 10 to 15 seconds and then resolved on its own.  It sounds like he was confused after the episode.  He did have one episode of vomiting.  Patient states that he felt fine immediately prior to the episode.  Apparently this has happened twice in the past, in 2006 and 2014.  Patient has never been on antiepileptic medicines before.  Of note, he was recently treated for UTI as an outpatient.  He does have recurrent UTIs.  He denies any recent dysuria, urinary urgency, urinary frequency, fevers, or chills.  Denies any chest pain or palpitations.  In the ED, he was in A. fib with heart rates  in the 70s.  Vitals were otherwise unremarkable.  Labs are significant for creatinine 5.81, WBC 10.6, hemoglobin 9.7.  CT head negative for acute abnormalities.  Hospitalists were called for admission.  HOSPITAL COURSE:   85 year old male patient with history of hypertension, atrial fibrillation, permanent pacemaker, hyperlipidemia, diabetes, prostate cancer came in because of episode of shaking in the emergency room.  There was a question about possible seizure.  *  Acute renal failure with CKD stage III, creatinine of 2.8 in PCP office in January 2020.   Started on IV fluids because of acute renal failure Foley placed due to bilateral hydronephrosis  Repeat ultrasound after several days of Foley catheter drainage. Creatinine has improved significantly from 5.8-3.8.  Trending down slowly. Discussed with urology.  Advised follow-up as outpatient in 1 week with Dr. Bernardo Heater  * Possible seizure precipitated by UTI, renal failure: Patient cannot have MRI because of pacemaker.  Patient had EEG which showed no seizure activity.  Did not have any further shaking episodes since here.    *  History of prostate cancer, patient is on Proscar  * UTI,; started on cefepime. Changed to Keflex Discharge home with oral antibiotics.  * Essential hypertension, uncontrolled. Continued on metoprolol. Added Norvasc with good control of blood pressure  Daughter did not want any new blood pressure medications added.  Discussed at length.  Prescription given.  Advised checking blood  pressure at home and discussing with primary care physician further.   * Infrarenal aortic aneurysm. Appreciate vascular input. Known. No surgeries.  Discharged home in stable condition with home health services.  CONSULTS OBTAINED:  Treatment Team:  Catarina Hartshorn, MD [name], MD  DRUG ALLERGIES:   Allergies  Allergen Reactions  . Diovan [Valsartan] Cough  . Gabapentin Other (See Comments)    Sedation  at all doses  . Hydralazine Itching  . Lisinopril Cough  . Pregabalin Other (See Comments)    Sedation at all doses  . Levofloxacin Other (See Comments)    Too many side effects. Nausea, vomiting, upset stomach, increased confusion, etc Other reaction(s): Confusion Nausea, chills Nausea, chills   . Sulfamethoxazole-Trimethoprim Other (See Comments)    Too many side effects. Nausea, vomiting, upset stomach, increased confusion, etc    DISCHARGE MEDICATIONS:   Allergies as of 11/09/2018      Reactions   Diovan [valsartan] Cough   Gabapentin Other (See Comments)   Sedation at all doses   Hydralazine Itching   Lisinopril Cough   Pregabalin Other (See Comments)   Sedation at all doses   Levofloxacin Other (See Comments)   Too many side effects. Nausea, vomiting, upset stomach, increased confusion, etc Other reaction(s): Confusion Nausea, chills Nausea, chills   Sulfamethoxazole-trimethoprim Other (See Comments)   Too many side effects. Nausea, vomiting, upset stomach, increased confusion, etc      Medication List    STOP taking these medications   cefUROXime 250 MG tablet Commonly known as:  CEFTIN   doxycycline 100 MG capsule Commonly known as:  VIBRAMYCIN     TAKE these medications   amLODipine 2.5 MG tablet Commonly known as:  NORVASC Take 1 tablet (2.5 mg total) by mouth daily.   Cephalexin 500 MG tablet Take 1 tablet (500 mg total) by mouth daily.   finasteride 5 MG tablet Commonly known as:  PROSCAR Take 5 mg by mouth daily.   glimepiride 2 MG tablet Commonly known as:  AMARYL 2 tablets in the morning and one tablet at night What changed:    how much to take  how to take this  when to take this  additional instructions   Metoprolol Tartrate 37.5 MG Tabs Take 37.5 mg by mouth 2 (two) times daily. What changed:  how much to take   pantoprazole 40 MG tablet Commonly known as:  PROTONIX Take 40 mg by mouth daily.   PRESERVISION/LUTEIN PO Take  1 capsule by mouth 2 (two) times daily.   multivitamin-iron-minerals-folic acid chewable tablet Chew 2 tablets by mouth daily.   rosuvastatin 5 MG tablet Commonly known as:  CRESTOR Take 5 mg by mouth at bedtime.   sitaGLIPtin 50 MG tablet Commonly known as:  JANUVIA Take 50 mg by mouth daily.   sodium bicarbonate 650 MG tablet Take 1 tablet (650 mg total) by mouth 2 (two) times daily.   tamsulosin 0.4 MG Caps capsule Commonly known as:  FLOMAX Take 0.4 mg by mouth daily.   VITRON-C PO Take 1 tablet by mouth daily.       Today   VITAL SIGNS:  Blood pressure 134/71, pulse 67, temperature 98.1 F (36.7 C), temperature source Oral, resp. rate 16, height 6\' 2"  (1.88 m), weight 98.8 kg, SpO2 97 %.  I/O:  No intake or output data in the 24 hours ending 11/11/18 1051  PHYSICAL EXAMINATION:  Physical Exam  GENERAL:  85 y.o.-year-old patient lying in the bed with no  acute distress.  LUNGS: Normal breath sounds bilaterally, no wheezing, rales,rhonchi or crepitation. No use of accessory muscles of respiration.  CARDIOVASCULAR: S1, S2 normal. No murmurs, rubs, or gallops.  ABDOMEN: Soft, non-tender, non-distended. Bowel sounds present. No organomegaly or mass.  Foley catheter in place NEUROLOGIC: Moves all 4 extremities. PSYCHIATRIC: The patient is alert and awake  DATA REVIEW:   CBC Recent Labs  Lab 11/07/18 1007  WBC 10.0  HGB 10.2*  HCT 32.3*  PLT 120*    Chemistries  Recent Labs  Lab 11/05/18 1530  11/09/18 0412  NA 134*   < > 144  K 3.8   < > 3.4*  CL 102   < > 116*  CO2 16*   < > 19*  GLUCOSE 180*   < > 91  BUN 86*   < > 58*  CREATININE 5.81*   < > 3.86*  CALCIUM 8.1*   < > 8.2*  AST 16  --   --   ALT 15  --   --   ALKPHOS 75  --   --   BILITOT 0.8  --   --    < > = values in this interval not displayed.    Cardiac Enzymes No results for input(s): TROPONINI in the last 168 hours.  Microbiology Results  Results for orders placed or performed  during the hospital encounter of 11/05/18  Urine Culture     Status: Abnormal   Collection Time: 11/05/18  8:27 PM  Result Value Ref Range Status   Specimen Description   Final    URINE, RANDOM Performed at [facility], [address], [place], [place] [address]    Special Requests   Final    NONE Performed at [facility], [address] [place], [place] [address]    Culture MULTIPLE SPECIES PRESENT, SUGGEST RECOLLECTION (A)  Final   Report Status 11/07/2018 FINAL  Final  Culture, blood (routine x 2) Call MD if unable to obtain prior to antibiotics being given     Status: None   Collection Time: 11/06/18 12:46 AM  Result Value Ref Range Status   Specimen Description BLOOD LEFT ANTECUBITAL  Final   Special Requests   Final    BOTTLES DRAWN AEROBIC AND ANAEROBIC Blood Culture adequate volume   Culture   Final    NO GROWTH 5 DAYS Performed at [facility], [address] [place], [place] [address]    Report Status 11/11/2018 FINAL  Final  Culture, blood (routine x 2) Call MD if unable to obtain prior to antibiotics being given     Status: None   Collection Time: 11/06/18 12:52 AM  Result Value Ref Range Status   Specimen Description BLOOD LEFT FOREARM  Final   Special Requests   Final    BOTTLES DRAWN AEROBIC AND ANAEROBIC Blood Culture adequate volume   Culture   Final    NO GROWTH 5 DAYS Performed at [facility], [address] [place], [place] [address]    Report Status 11/11/2018 FINAL  Final  MRSA PCR Screening     Status: None   Collection Time: 11/07/18  3:02 PM  Result Value Ref Range Status   MRSA by PCR NEGATIVE NEGATIVE Final    Comment:        The GeneXpert MRSA Assay (FDA approved for NASAL specimens only), is one component of a comprehensive MRSA colonization surveillance program. It is not intended to diagnose MRSA infection nor to  guide or monitor treatment for MRSA infections. Performed at [facility], [address] [place], [place] [address]     RADIOLOGY:  No results found.  Follow up with PCP in 1 week.  Management plans discussed with the patient, family and they are in agreement.  CODE STATUS:  Code Status History    Date Active Date Inactive Code Status Order ID Comments User Context   11/05/2018 7915 11/09/2018 1728 Full Code [id number]  Sela Hua, MD Inpatient   09/10/2017 1430 09/13/2017 2115 Full Code [id number]  Idelle Crouch, MD Inpatient   06/10/2017 2304 06/12/2017 1908 Full Code [id number]  Lance Coon, MD Inpatient   05/17/2017 0829 05/19/2017 1525 Full Code [id number]  Hillary Bow, MD ED   03/31/2017 1334 04/03/2017 1611 Full Code [id number]  Nicholes Mango, MD Inpatient   10/21/2016 0155 10/25/2016 2145 Full Code [id number]  Hugelmeyer, [place], DO Inpatient   10/09/2016 2342 10/13/2016 2009 Full Code [id number]  Vaughan Basta, MD Inpatient   04/14/2015 1432 04/15/2015 1739 Full Code [id number]  Isaias Cowman, MD Inpatient   04/09/2015 0022 04/14/2015 1432 Full Code [id number]  Hower, Aaron Mose, MD ED      TOTAL TIME TAKING CARE OF THIS PATIENT ON DAY OF DISCHARGE: more than 30 minutes.   Leia Alf Audry Kauzlarich M.D on 11/11/2018 at 10:51 AM  Between 7am to 6pm - Pager - [phone]  After 6pm go to [email or web address] - password [email or web address] [facility]  SOUND  Hospitalists  Office  [phone]  CC: Primary care physician; Adin Hector, MD  Note: This dictation was prepared with Dragon dictation along with smaller phrase technology. Any transcriptional errors that result from this process are unintentional.

## 2018-11-16 ENCOUNTER — Telehealth: Payer: Self-pay | Admitting: Urology

## 2018-11-16 NOTE — Telephone Encounter (Signed)
Pt's daughter, Brunetta Genera, called and is concerned about his catheter tubing.  She says it has either a culture growth or sediment in it.  She would like for someone to return her call [phone].

## 2018-11-16 NOTE — Telephone Encounter (Signed)
SPoke with patient's daughter some sediment noted in tubing but draining fine, denies fever , chills, nausea and vomiting. Recommended drinking more water, if sediment causing tubing not to drain an order can be given to home health for irrigation. Daughter verbalized understanding

## 2018-11-21 ENCOUNTER — Inpatient Hospital Stay
Admission: EM | Admit: 2018-11-21 | Discharge: 2018-11-29 | DRG: 871 | Disposition: A | Discharge status: Home Health | Payer: Medicare Other | Attending: Internal Medicine | Admitting: Internal Medicine

## 2018-11-21 ENCOUNTER — Ambulatory Visit (INDEPENDENT_AMBULATORY_CARE_PROVIDER_SITE_OTHER): Payer: Medicare Other | Admitting: Urology

## 2018-11-21 ENCOUNTER — Other Ambulatory Visit: Payer: Self-pay

## 2018-11-21 ENCOUNTER — Encounter: Payer: Self-pay | Admitting: Urology

## 2018-11-21 ENCOUNTER — Ambulatory Visit: Payer: Medicare Other | Admitting: Urology

## 2018-11-21 VITALS — BP 118/73 | HR 86

## 2018-11-21 DIAGNOSIS — E1122 Type 2 diabetes mellitus with diabetic chronic kidney disease: Secondary | ICD-10-CM | POA: Diagnosis present

## 2018-11-21 DIAGNOSIS — N179 Acute kidney failure, unspecified: Secondary | ICD-10-CM | POA: Diagnosis present

## 2018-11-21 DIAGNOSIS — E785 Hyperlipidemia, unspecified: Secondary | ICD-10-CM | POA: Diagnosis present

## 2018-11-21 DIAGNOSIS — Z8249 Family history of ischemic heart disease and other diseases of the circulatory system: Secondary | ICD-10-CM | POA: Diagnosis not present

## 2018-11-21 DIAGNOSIS — Z1159 Encounter for screening for other viral diseases: Secondary | ICD-10-CM

## 2018-11-21 DIAGNOSIS — I714 Abdominal aortic aneurysm, without rupture: Secondary | ICD-10-CM | POA: Diagnosis present

## 2018-11-21 DIAGNOSIS — N320 Bladder-neck obstruction: Secondary | ICD-10-CM | POA: Diagnosis present

## 2018-11-21 DIAGNOSIS — I482 Chronic atrial fibrillation, unspecified: Secondary | ICD-10-CM | POA: Diagnosis present

## 2018-11-21 DIAGNOSIS — G9341 Metabolic encephalopathy: Secondary | ICD-10-CM | POA: Diagnosis present

## 2018-11-21 DIAGNOSIS — Z7984 Long term (current) use of oral hypoglycemic drugs: Secondary | ICD-10-CM

## 2018-11-21 DIAGNOSIS — N390 Urinary tract infection, site not specified: Secondary | ICD-10-CM

## 2018-11-21 DIAGNOSIS — E1151 Type 2 diabetes mellitus with diabetic peripheral angiopathy without gangrene: Secondary | ICD-10-CM | POA: Diagnosis present

## 2018-11-21 DIAGNOSIS — Z87891 Personal history of nicotine dependence: Secondary | ICD-10-CM | POA: Diagnosis not present

## 2018-11-21 DIAGNOSIS — Z91013 Allergy to seafood: Secondary | ICD-10-CM | POA: Diagnosis not present

## 2018-11-21 DIAGNOSIS — Z823 Family history of stroke: Secondary | ICD-10-CM | POA: Diagnosis not present

## 2018-11-21 DIAGNOSIS — Z923 Personal history of irradiation: Secondary | ICD-10-CM | POA: Diagnosis not present

## 2018-11-21 DIAGNOSIS — N1330 Unspecified hydronephrosis: Secondary | ICD-10-CM

## 2018-11-21 DIAGNOSIS — N400 Enlarged prostate without lower urinary tract symptoms: Secondary | ICD-10-CM | POA: Diagnosis present

## 2018-11-21 DIAGNOSIS — N136 Pyonephrosis: Secondary | ICD-10-CM | POA: Diagnosis present

## 2018-11-21 DIAGNOSIS — I129 Hypertensive chronic kidney disease with stage 1 through stage 4 chronic kidney disease, or unspecified chronic kidney disease: Secondary | ICD-10-CM | POA: Diagnosis present

## 2018-11-21 DIAGNOSIS — D631 Anemia in chronic kidney disease: Secondary | ICD-10-CM | POA: Diagnosis present

## 2018-11-21 DIAGNOSIS — Z8546 Personal history of malignant neoplasm of prostate: Secondary | ICD-10-CM

## 2018-11-21 DIAGNOSIS — Z888 Allergy status to other drugs, medicaments and biological substances status: Secondary | ICD-10-CM

## 2018-11-21 DIAGNOSIS — Z87440 Personal history of urinary (tract) infections: Secondary | ICD-10-CM

## 2018-11-21 DIAGNOSIS — A4152 Sepsis due to Pseudomonas: Principal | ICD-10-CM | POA: Diagnosis present

## 2018-11-21 DIAGNOSIS — Z8551 Personal history of malignant neoplasm of bladder: Secondary | ICD-10-CM | POA: Diagnosis not present

## 2018-11-21 DIAGNOSIS — C679 Malignant neoplasm of bladder, unspecified: Secondary | ICD-10-CM

## 2018-11-21 DIAGNOSIS — N184 Chronic kidney disease, stage 4 (severe): Secondary | ICD-10-CM | POA: Diagnosis present

## 2018-11-21 DIAGNOSIS — Z95810 Presence of automatic (implantable) cardiac defibrillator: Secondary | ICD-10-CM

## 2018-11-21 DIAGNOSIS — Z79899 Other long term (current) drug therapy: Secondary | ICD-10-CM | POA: Diagnosis not present

## 2018-11-21 DIAGNOSIS — I444 Left anterior fascicular block: Secondary | ICD-10-CM | POA: Diagnosis present

## 2018-11-21 HISTORY — DX: Presence of cardiac pacemaker: Z95.0

## 2018-11-21 LAB — CBC WITH DIFFERENTIAL/PLATELET
Abs Immature Granulocytes: 0.03 10*3/uL (ref 0.00–0.07)
Basophils Absolute: 0 10*3/uL (ref 0.0–0.1)
Basophils Relative: 0 %
Eosinophils Absolute: 0 10*3/uL (ref 0.0–0.5)
Eosinophils Relative: 0 %
HCT: 30.9 % — ABNORMAL LOW (ref 39.0–52.0)
Hemoglobin: 10.1 g/dL — ABNORMAL LOW (ref 13.0–17.0)
Immature Granulocytes: 0 %
Lymphocytes Relative: 6 %
Lymphs Abs: 0.6 10*3/uL — ABNORMAL LOW (ref 0.7–4.0)
MCH: 27.7 pg (ref 26.0–34.0)
MCHC: 32.7 g/dL (ref 30.0–36.0)
MCV: 84.9 fL (ref 80.0–100.0)
Monocytes Absolute: 0.9 10*3/uL (ref 0.1–1.0)
Monocytes Relative: 8 %
Neutro Abs: 9.4 10*3/uL — ABNORMAL HIGH (ref 1.7–7.7)
Neutrophils Relative %: 86 %
Platelets: 136 10*3/uL — ABNORMAL LOW (ref 150–400)
RBC: 3.64 MIL/uL — ABNORMAL LOW (ref 4.22–5.81)
RDW: 15.9 % — ABNORMAL HIGH (ref 11.5–15.5)
WBC: 11 10*3/uL — ABNORMAL HIGH (ref 4.0–10.5)
nRBC: 0 % (ref 0.0–0.2)

## 2018-11-21 LAB — COMPREHENSIVE METABOLIC PANEL
ALT: 32 U/L (ref 0–44)
AST: 34 U/L (ref 15–41)
Albumin: 2.9 g/dL — ABNORMAL LOW (ref 3.5–5.0)
Alkaline Phosphatase: 82 U/L (ref 38–126)
Anion gap: 12 (ref 5–15)
BUN: 78 mg/dL — ABNORMAL HIGH (ref 8–23)
CO2: 20 mmol/L — ABNORMAL LOW (ref 22–32)
Calcium: 8.1 mg/dL — ABNORMAL LOW (ref 8.9–10.3)
Chloride: 99 mmol/L (ref 98–111)
Creatinine, Ser: 5.54 mg/dL — ABNORMAL HIGH (ref 0.61–1.24)
GFR calc Af Amer: 9 mL/min — ABNORMAL LOW (ref >60)
GFR calc non Af Amer: 8 mL/min — ABNORMAL LOW (ref >60)
Glucose, Bld: 242 mg/dL — ABNORMAL HIGH (ref 70–99)
Potassium: 4.6 mmol/L (ref 3.5–5.1)
Sodium: 131 mmol/L — ABNORMAL LOW (ref 135–145)
Total Bilirubin: 0.4 mg/dL (ref 0.3–1.2)
Total Protein: 7.7 g/dL (ref 6.5–8.1)

## 2018-11-21 LAB — URINALYSIS, COMPLETE (UACMP) WITH MICROSCOPIC
Bacteria, UA: NONE SEEN
Bilirubin Urine: NEGATIVE
Glucose, UA: 50 mg/dL — AB
Ketones, ur: NEGATIVE mg/dL
Nitrite: NEGATIVE
Protein, ur: 30 mg/dL — AB
Specific Gravity, Urine: 1.008 (ref 1.005–1.030)
Squamous Epithelial / HPF: NONE SEEN (ref 0–5)
WBC, UA: >50 WBC/hpf — ABNORMAL HIGH (ref 0–5)
pH: 6 (ref 5.0–8.0)

## 2018-11-21 LAB — MICROSCOPIC EXAMINATION
Epithelial Cells (non renal): NONE SEEN /hpf (ref 0–10)
WBC, UA: >30 /hpf — AB (ref 0–5)

## 2018-11-21 LAB — URINALYSIS, COMPLETE
Bilirubin, UA: NEGATIVE
Glucose, UA: NEGATIVE
Ketones, UA: NEGATIVE
Nitrite, UA: NEGATIVE
Specific Gravity, UA: 1.01 (ref 1.005–1.030)
Urobilinogen, Ur: 0.2 mg/dL (ref 0.2–1.0)
pH, UA: 5.5 (ref 5.0–7.5)

## 2018-11-21 LAB — SARS CORONAVIRUS 2 BY RT PCR (HOSPITAL ORDER, PERFORMED IN ~~LOC~~ HOSPITAL LAB): SARS Coronavirus 2: NEGATIVE

## 2018-11-21 LAB — GLUCOSE, CAPILLARY
Glucose-Capillary: 173 mg/dL — ABNORMAL HIGH (ref 70–99)
Glucose-Capillary: 240 mg/dL — ABNORMAL HIGH (ref 70–99)

## 2018-11-21 LAB — BRAIN NATRIURETIC PEPTIDE: B Natriuretic Peptide: 957 pg/mL — ABNORMAL HIGH (ref 0.0–100.0)

## 2018-11-21 MED ORDER — FERROUS SULFATE 325 (65 FE) MG PO TABS
325.0000 mg | ORAL_TABLET | Freq: Every day | ORAL | Status: DC
  Administered 2018-11-22 – 2018-11-29 (×8): 325 mg via ORAL
  Filled 2018-11-21 (×8): qty 1

## 2018-11-21 MED ORDER — AMLODIPINE BESYLATE 5 MG PO TABS
2.5000 mg | ORAL_TABLET | Freq: Every day | ORAL | Status: DC
  Administered 2018-11-22 – 2018-11-29 (×8): 2.5 mg via ORAL
  Filled 2018-11-21 (×8): qty 1

## 2018-11-21 MED ORDER — POLYETHYLENE GLYCOL 3350 17 G PO PACK: 17.0000 g | PACK | Freq: Every day | ORAL | Status: DC | PRN

## 2018-11-21 MED ORDER — ENOXAPARIN SODIUM 40 MG/0.4ML ~~LOC~~ SOLN: 40.0000 mg | SUBCUTANEOUS | Status: DC

## 2018-11-21 MED ORDER — ONDANSETRON HCL 4 MG/2ML IJ SOLN: 4.0000 mg | Freq: Four times a day (QID) | INTRAMUSCULAR | Status: DC | PRN

## 2018-11-21 MED ORDER — ACETAMINOPHEN 650 MG RE SUPP: 650.0000 mg | Freq: Four times a day (QID) | RECTAL | Status: DC | PRN

## 2018-11-21 MED ORDER — SODIUM BICARBONATE 650 MG PO TABS
650.0000 mg | ORAL_TABLET | Freq: Two times a day (BID) | ORAL | Status: DC
  Administered 2018-11-22 – 2018-11-29 (×15): 650 mg via ORAL
  Filled 2018-11-21 (×15): qty 1

## 2018-11-21 MED ORDER — PANTOPRAZOLE SODIUM 40 MG PO TBEC
40.0000 mg | DELAYED_RELEASE_TABLET | Freq: Every day | ORAL | Status: DC
  Administered 2018-11-22 – 2018-11-29 (×8): 40 mg via ORAL
  Filled 2018-11-21 (×8): qty 1

## 2018-11-21 MED ORDER — ROSUVASTATIN CALCIUM 5 MG PO TABS
5.0000 mg | ORAL_TABLET | Freq: Every day | ORAL | Status: DC
  Administered 2018-11-22 – 2018-11-28 (×7): 5 mg via ORAL
  Filled 2018-11-21 (×9): qty 1

## 2018-11-21 MED ORDER — TRAZODONE HCL 50 MG PO TABS: 25.0000 mg | ORAL_TABLET | Freq: Every evening | ORAL | Status: DC | PRN

## 2018-11-21 MED ORDER — FINASTERIDE 5 MG PO TABS
5.0000 mg | ORAL_TABLET | Freq: Every day | ORAL | Status: DC
  Administered 2018-11-22 – 2018-11-29 (×8): 5 mg via ORAL
  Filled 2018-11-21 (×8): qty 1

## 2018-11-21 MED ORDER — DOXYCYCLINE HYCLATE 100 MG PO CAPS: 100.0000 mg | ORAL_CAPSULE | Freq: Two times a day (BID) | ORAL | 0 refills | Status: DC

## 2018-11-21 MED ORDER — METOPROLOL TARTRATE 25 MG PO TABS
25.0000 mg | ORAL_TABLET | Freq: Two times a day (BID) | ORAL | Status: DC
  Administered 2018-11-22 – 2018-11-29 (×15): 25 mg via ORAL
  Filled 2018-11-21 (×15): qty 1

## 2018-11-21 MED ORDER — HEPARIN SODIUM (PORCINE) 5000 UNIT/ML IJ SOLN
5000.0000 [IU] | Freq: Three times a day (TID) | INTRAMUSCULAR | Status: DC
  Administered 2018-11-22: 5000 [IU] via SUBCUTANEOUS
  Filled 2018-11-21: qty 1

## 2018-11-21 MED ORDER — SODIUM CHLORIDE 0.9 % IV BOLUS
500.0000 mL | Freq: Once | INTRAVENOUS | Status: AC
  Administered 2018-11-21: 500 mL via INTRAVENOUS

## 2018-11-21 MED ORDER — TAMSULOSIN HCL 0.4 MG PO CAPS
0.4000 mg | ORAL_CAPSULE | Freq: Every day | ORAL | Status: DC
  Administered 2018-11-22 – 2018-11-29 (×8): 0.4 mg via ORAL
  Filled 2018-11-21 (×8): qty 1

## 2018-11-21 MED ORDER — IRON-VITAMIN C 65-125 MG PO TABS: ORAL_TABLET | Freq: Every day | ORAL | Status: DC

## 2018-11-21 MED ORDER — ONDANSETRON HCL 4 MG PO TABS: 4.0000 mg | ORAL_TABLET | Freq: Four times a day (QID) | ORAL | Status: DC | PRN

## 2018-11-21 MED ORDER — ACETAMINOPHEN 325 MG PO TABS
650.0000 mg | ORAL_TABLET | Freq: Four times a day (QID) | ORAL | Status: DC | PRN
  Administered 2018-11-27: 650 mg via ORAL
  Filled 2018-11-21: qty 2

## 2018-11-21 MED ORDER — TAB-A-VITE/IRON PO TABS
1.0000 | ORAL_TABLET | Freq: Every day | ORAL | Status: DC
  Administered 2018-11-22 – 2018-11-29 (×8): 1 via ORAL
  Filled 2018-11-21 (×8): qty 1

## 2018-11-21 MED ORDER — VITAMIN C 500 MG PO TABS
250.0000 mg | ORAL_TABLET | Freq: Every day | ORAL | Status: DC
  Administered 2018-11-22 – 2018-11-29 (×8): 250 mg via ORAL
  Filled 2018-11-21 (×8): qty 1

## 2018-11-21 NOTE — ED Notes (Signed)
Peri care provided by Wilfred Lacy NT and this RN. Pt urinated on self and had small soft BM. Pt repositioned in bed.

## 2018-11-21 NOTE — ED Notes (Signed)
Daughter Brunetta Genera given brief update okay per pt. Per family pt usually A&Ox4, able to walk with a walker (today too fatigued/weak to walk), concerned about kidney function and wants a nephrology/urology consult. [place] [phone] would like a regular update. States pt has history of bladder tumor.

## 2018-11-21 NOTE — ED Notes (Signed)
ED TO INPATIENT HANDOFF REPORT  ED Nurse Name and Phone #: Metta Clines [phone]  S Name/Age/Gender Blake [name] 85 y.o. male Room/Bed: ED17A/ED17A  Code Status   Code Status: Prior  Home/SNF/Other Home Patient oriented to: self and place Is this baseline? No   Triage Complete: Triage complete  Chief Complaint Abnormal Labs  Triage Note Pt in via EMS from home d/t kidney labs and history of Afib. PM/defib in place.    Allergies Allergies  Allergen Reactions  . Diovan [Valsartan] Cough  . Gabapentin Other (See Comments)    Sedation at all doses  . Hydralazine Itching  . Lisinopril Cough  . Pregabalin Other (See Comments)    Sedation at all doses  . Levofloxacin Other (See Comments)    Too many side effects. Nausea, vomiting, upset stomach, increased confusion, etc Other reaction(s): Confusion Nausea, chills Nausea, chills   . Sulfamethoxazole-Trimethoprim Other (See Comments)    Too many side effects. Nausea, vomiting, upset stomach, increased confusion, etc    Level of Care/Admitting Diagnosis ED Disposition    ED Disposition Condition Comment   Admit  The patient appears reasonably stabilized for admission considering the current resources, flow, and capabilities available in the ED at this time, and I doubt any other [facility] requiring further screening and/or treatment in the ED prior to admission is  present.       B Medical/Surgery History Past Medical History:  Diagnosis Date  . AAA (abdominal aortic aneurysm) ([place])   . Arrhythmia   . Diabetes mellitus without complication ([place])   . GI bleed   . Heart murmur   . Hyperlipidemia   . Hypertension   . Pacemaker   . Prostate cancer [facility])    Past Surgical History:  Procedure Laterality Date  . FLEXIBLE SIGMOIDOSCOPY N/A 10/21/2016   Procedure: FLEXIBLE SIGMOIDOSCOPY;  Surgeon: [name], MD;  Location: [facility] ENDOSCOPY;  Service: Endoscopy;  Laterality: N/A;  . JOINT REPLACEMENT    . PACEMAKER  INSERTION Left 04/14/2015   Procedure: INSERTION PACEMAKER;  Surgeon: Isaias Cowman, MD;  Location: [facility] ORS;  Service: Cardiovascular;  Laterality: Left;  . PROSTATE SURGERY    . VISCERAL ARTERY INTERVENTION N/A 10/22/2016   Procedure: Visceral Artery Intervention;  Surgeon: Algernon Huxley, MD;  Location: [place] CV LAB;  Service: Cardiovascular;  Laterality: N/A;     A IV Location/Drains/Wounds Patient Lines/Drains/Airways Status   Active Line/Drains/Airways    Name:   Placement date:   Placement time:   Site:   Days:   Peripheral IV 11/21/18 Right;Anterior Forearm   11/21/18    1952    Forearm   less than 1   Urethral Catheter Edrick Oh 20 Fr.   11/06/18    1820    -   15   Pressure Injury 09/11/17 Stage I -  Intact skin with non-blanchable redness of a localized area usually over a bony prominence. red callus   09/11/17    -     436          Intake/Output Last 24 hours No intake or output data in the 24 hours ending 11/21/18 2129  Labs/Imaging Results for orders placed or performed during the hospital encounter of 11/21/18 (from the past 48 hour(s))  Glucose, capillary     Status: Abnormal   Collection Time: 11/21/18  7:33 PM  Result Value Ref Range   Glucose-Capillary 240 (H) 70 - 99 mg/dL  CBC with Differential     Status: Abnormal  Collection Time: 11/21/18  7:48 PM  Result Value Ref Range   WBC 11.0 (H) 4.0 - 10.5 K/uL   RBC 3.64 (L) 4.22 - 5.81 MIL/uL   Hemoglobin 10.1 (L) 13.0 - 17.0 g/dL   HCT 30.9 (L) 39.0 - 52.0 %   MCV 84.9 80.0 - 100.0 fL   MCH 27.7 26.0 - 34.0 pg   MCHC 32.7 30.0 - 36.0 g/dL   RDW 15.9 (H) 11.5 - 15.5 %   Platelets 136 (L) 150 - 400 K/uL   nRBC 0.0 0.0 - 0.2 %   Neutrophils Relative % 86 %   Neutro Abs 9.4 (H) 1.7 - 7.7 K/uL   Lymphocytes Relative 6 %   Lymphs Abs 0.6 (L) 0.7 - 4.0 K/uL   Monocytes Relative 8 %   Monocytes Absolute 0.9 0.1 - 1.0 K/uL   Eosinophils Relative 0 %   Eosinophils Absolute 0.0 0.0 - 0.5 K/uL    Basophils Relative 0 %   Basophils Absolute 0.0 0.0 - 0.1 K/uL   Immature Granulocytes 0 %   Abs Immature Granulocytes 0.03 0.00 - 0.07 K/uL    Comment: Performed at [facility], [address] [place], [place] [address]  Comprehensive metabolic panel     Status: Abnormal   Collection Time: 11/21/18  7:48 PM  Result Value Ref Range   Sodium 131 (L) 135 - 145 mmol/L   Potassium 4.6 3.5 - 5.1 mmol/L   Chloride 99 98 - 111 mmol/L   CO2 20 (L) 22 - 32 mmol/L   Glucose, Bld 242 (H) 70 - 99 mg/dL   BUN 78 (H) 8 - 23 mg/dL   Creatinine, Ser 5.54 (H) 0.61 - 1.24 mg/dL   Calcium 8.1 (L) 8.9 - 10.3 mg/dL   Total Protein 7.7 6.5 - 8.1 g/dL   Albumin 2.9 (L) 3.5 - 5.0 g/dL   AST 34 15 - 41 U/L   ALT 32 0 - 44 U/L   Alkaline Phosphatase 82 38 - 126 U/L   Total Bilirubin 0.4 0.3 - 1.2 mg/dL   GFR calc non Af Amer 8 (L) >60 mL/min   GFR calc Af Amer 9 (L) >60 mL/min   Anion gap 12 5 - 15    Comment: Performed at [facility], [address] [place], [place] [address]   No results found.  Pending Labs Unresulted Labs (From admission, onward)    Start     Ordered   11/21/18 2058  SARS Coronavirus 2 [facility] order, Performed in [place] hospital lab)  (Novel Coronavirus, [facility] Order))  ONCE - STAT,   STAT     11/21/18 2057   11/21/18 2058  Urine culture  Once,   STAT     11/21/18 2058   11/21/18 1914  Urinalysis, Complete w Microscopic  ONCE - STAT,   STAT     11/21/18 1914          Vitals/Pain Today's Vitals   11/21/18 2022 11/21/18 2030 11/21/18 2033 11/21/18 2100  BP:    (!) 152/95  Pulse:  64  70  Resp: 18  (!) 21 (!) 21  Temp:      TempSrc:      SpO2:  100%  98%  Weight:      Height:      PainSc:        Isolation Precautions Droplet and Contact precautions  Medications Medications  sodium chloride 0.9 % bolus 500 mL (0 mLs Intravenous Stopped  11/21/18 2042)    Mobility Usually walks with walker at home per family but since last  hospitalization pt hasn't been able to; also usually A&Ox4 per family.  Moderate fall risk   Focused Assessments  A&Ox2 (oriented to self and place) Cardiac Rhythm: Atrial fibrillation              R Recommendations: See Admitting Provider Note  Report given to:   Additional Notes:  Foley removed today; may place another depending on bladder scan; pt hard stick; R fa 20g IV via IV team; denies pain; too weak to walk; will complete swallow screen.

## 2018-11-21 NOTE — ED Notes (Signed)
[place] pt's daughter updated via phone.

## 2018-11-21 NOTE — ED Notes (Signed)
Rails up. Bed in lowest position. Pt given warm blanket.

## 2018-11-21 NOTE — Progress Notes (Signed)
11/21/2018 12:23 PM   [name] [birth year] [id number]  Referring provider: Adin Hector, MD [facility] [place], [place] [address]  Chief Complaint  Patient presents with  . Hospitalization Follow-up    HPI: 85 year old male presents for hospital follow-up.  Refer to Dr. Doristine Counter inpatient consult note of 11/06/2018 for details surrounding his recent hospitalization.  CT showed bilateral hydronephrosis and worsening renal function.  A Foley catheter was placed with improvement in his creatinine which was 3.86 at discharge.  He presents today with his son.  They have noted blood in his catheter bag and cloudy urine.  He also feels his mental status has worsened over the last several days.  He is back on cephalexin suppression.  He saw Dr. Caryl Comes on 5/6 and his creatinine was slightly higher at 4.3.  His son states they do have a nephrology appointment tomorrow.  He presents today for catheter removal.   PMH: Past Medical History:  Diagnosis Date  . AAA (abdominal aortic aneurysm) ([place])   . Arrhythmia   . Diabetes mellitus without complication ([place])   . GI bleed   . Heart murmur   . Hyperlipidemia   . Hypertension   . Prostate cancer [facility])     Surgical History: Past Surgical History:  Procedure Laterality Date  . FLEXIBLE SIGMOIDOSCOPY N/A 10/21/2016   Procedure: FLEXIBLE SIGMOIDOSCOPY;  Surgeon: [name], MD;  Location: [facility] ENDOSCOPY;  Service: Endoscopy;  Laterality: N/A;  . JOINT REPLACEMENT    . PACEMAKER INSERTION Left 04/14/2015   Procedure: INSERTION PACEMAKER;  Surgeon: Isaias Cowman, MD;  Location: [facility] ORS;  Service: Cardiovascular;  Laterality: Left;  . PROSTATE SURGERY    . VISCERAL ARTERY INTERVENTION N/A 10/22/2016   Procedure: Visceral Artery Intervention;  Surgeon: Algernon Huxley, MD;  Location: [place] CV LAB;  Service: Cardiovascular;  Laterality: N/A;    Home Medications:  Allergies as of 11/21/2018       Reactions   Diovan [valsartan] Cough   Gabapentin Other (See Comments)   Sedation at all doses   Hydralazine Itching   Lisinopril Cough   Pregabalin Other (See Comments)   Sedation at all doses   Levofloxacin Other (See Comments)   Too many side effects. Nausea, vomiting, upset stomach, increased confusion, etc Other reaction(s): Confusion Nausea, chills Nausea, chills   Sulfamethoxazole-trimethoprim Other (See Comments)   Too many side effects. Nausea, vomiting, upset stomach, increased confusion, etc      Medication List       Accurate as of Nov 21, 2018 12:23 PM. If you have any questions, ask your nurse or doctor.        amLODipine 2.5 MG tablet Commonly known as:  NORVASC Take 1 tablet (2.5 mg total) by mouth daily.   cefpodoxime 200 MG tablet Commonly known as:  VANTIN TAKE 1 TABLET BY MOUTH EVERY 12 HOURS FOR 7 DAYS   Cephalexin 500 MG tablet Take 1 tablet (500 mg total) by mouth daily.   finasteride 5 MG tablet Commonly known as:  PROSCAR Take 5 mg by mouth daily.   glimepiride 2 MG tablet Commonly known as:  AMARYL 2 tablets in the morning and one tablet at night What changed:    how much to take  how to take this  when to take this  additional instructions   Metoprolol Tartrate 37.5 MG Tabs Take 37.5 mg by mouth 2 (two) times daily. What changed:  how much to take  pantoprazole 40 MG tablet Commonly known as:  PROTONIX Take 40 mg by mouth daily.   PRESERVISION/LUTEIN PO Take 1 capsule by mouth 2 (two) times daily.   multivitamin-iron-minerals-folic acid chewable tablet Chew 2 tablets by mouth daily.   rosuvastatin 5 MG tablet Commonly known as:  CRESTOR Take 5 mg by mouth at bedtime.   sitaGLIPtin 50 MG tablet Commonly known as:  JANUVIA Take 50 mg by mouth daily.   sodium bicarbonate 650 MG tablet Take 1 tablet (650 mg total) by mouth 2 (two) times daily.   tamsulosin 0.4 MG Caps capsule Commonly known as:  FLOMAX Take  0.4 mg by mouth daily.   VITRON-C PO Take 1 tablet by mouth daily.       Allergies:  Allergies  Allergen Reactions  . Diovan [Valsartan] Cough  . Gabapentin Other (See Comments)    Sedation at all doses  . Hydralazine Itching  . Lisinopril Cough  . Pregabalin Other (See Comments)    Sedation at all doses  . Levofloxacin Other (See Comments)    Too many side effects. Nausea, vomiting, upset stomach, increased confusion, etc Other reaction(s): Confusion Nausea, chills Nausea, chills   . Sulfamethoxazole-Trimethoprim Other (See Comments)    Too many side effects. Nausea, vomiting, upset stomach, increased confusion, etc    Family History: Family History  Problem Relation Age of Onset  . Hypertension Other   . Stroke Other   . Heart attack Other   . Stroke Sister   . Heart attack Brother     Social History:  reports that he has quit smoking. He has never used smokeless tobacco. He reports that he does not drink alcohol or use drugs.  ROS: UROLOGY Frequent Urination?: No Hard to postpone urination?: No Burning/pain with urination?: No Get up at night to urinate?: No Leakage of urine?: Yes Urine stream starts and stops?: Yes Trouble starting stream?: No Do you have to strain to urinate?: No Blood in urine?: Yes Urinary tract infection?: Yes Sexually transmitted disease?: No Injury to kidneys or bladder?: No Painful intercourse?: No Weak stream?: No Erection problems?: No Penile pain?: No  Gastrointestinal Nausea?: No Vomiting?: No Indigestion/heartburn?: No Diarrhea?: No Constipation?: No  Constitutional Fever: No Night sweats?: No Weight loss?: No Fatigue?: Yes  Skin Skin rash/lesions?: No Itching?: No  Eyes Blurred vision?: No Double vision?: No  Ears/Nose/Throat Sore throat?: No Sinus problems?: No  Hematologic/Lymphatic Swollen glands?: No Easy bruising?: No  Cardiovascular Leg swelling?: No Chest pain?: No  Respiratory  Cough?: No Shortness of breath?: No  Endocrine Excessive thirst?: No  Musculoskeletal Back pain?: No Joint pain?: No  Neurological Headaches?: Yes Dizziness?: No  Psychologic Depression?: No Anxiety?: No  Physical Exam: BP 118/73 (BP Location: Left Arm, Patient Position: Sitting, Cuff Size: Normal)   Pulse 86   Constitutional:  Alert, no acute distress. HEENT:  AT, moist mucus membranes.  Trachea midline, no masses. Cardiovascular: No clubbing, cyanosis, or edema. Respiratory: Normal respiratory effort, no increased work of breathing. GU: No CVA tenderness.  Purulent urine draining catheter tubing Neurologic: Grossly intact, no focal deficits, moving all 4 extremities. Psychiatric: Normal mood and affect.   Assessment & Plan:   85 year old male with a history of high-grade bladder cancer and incompletely resected tumor by Dr. Jacqlyn Larsen.  He has bilateral hydronephrosis and worsening renal function.  He has an appointment with nephrology tomorrow.  Urine today is purulent and son has noted worsening mental status.  His catheter was clamped and urine was  collected for culture.  His catheter was removed and family was instructed to call for voiding problems or urinary retention.  Family has previously wanted to hold off on TURBT due to concerns of anesthesia.  Will await nephrology evaluation.  We discussed possibility of cystoscopy with TURBT/bladder biopsy, bilateral retrograde pyelograms and possible ureteral stent placement.  We discontinue suppressive antibiotic dose and start additional antibiotic pending his urine culture report.  Follow-up to be determined after nephrology eval.  His son stated he had blood work scheduled today at [facility].   Abbie Sons, [place] [address] [place], [place] [address] [phone]

## 2018-11-21 NOTE — ED Triage Notes (Signed)
Pt in via EMS from home d/t kidney labs and history of Afib. PM/defib in place.

## 2018-11-21 NOTE — ED Notes (Addendum)
EKG completed. Ems attempted for Iv without success. VSS with EMS. Foley cath removed today per EMS. Cough present for months per family/EMS. Pt currently A&Ox2 (oriented to self and place). Non-slip socks placed on pt's feet.

## 2018-11-21 NOTE — ED Notes (Signed)
Brett NT to swab and bladder scan pt now.

## 2018-11-21 NOTE — ED Notes (Signed)
Iv team placed 20g but won't draw back enough for labs.

## 2018-11-21 NOTE — ED Notes (Signed)
Pt given more warm blankets. TV turned on for pt. Rails up. Bed in lowest position.

## 2018-11-21 NOTE — ED Notes (Signed)
Lav/grn tubes collected by IV team.

## 2018-11-21 NOTE — ED Notes (Signed)
Will collect urine sample via I&O cath if pt still unable to provide urine sample.

## 2018-11-21 NOTE — ED Notes (Signed)
Pt given warm blankets.

## 2018-11-21 NOTE — ED Notes (Addendum)
IV team placing 22g now for labs. BG 240.

## 2018-11-21 NOTE — ED Provider Notes (Signed)
[facility] Emergency Department Provider Note  ___________________________________________   None    (approximate)  I have reviewed the triage vital signs and the nursing notes.   HISTORY  Chief Complaint Abnormal Lab   HPI Blake [name] is a 85 y.o. male who presents to the emergency department due to abnormal kidney function based on labs from 11/15/2018. He was evaluated by Dr. Bernardo Heater today. Foley catheter that was inserted during his last admission in April was removed and urine culture collected. No antibiotics currently as they are awaiting the culture results. Patient complains only of a headache at this time and is unsure why he is here in the emergency department.    Past Medical History:  Diagnosis Date  . AAA (abdominal aortic aneurysm) ([place])   . Arrhythmia   . Diabetes mellitus without complication ([place])   . GI bleed   . Heart murmur   . Hyperlipidemia   . Hypertension   . Pacemaker   . Prostate cancer [facility])     Patient Active Problem List   Diagnosis Date Noted  . AKI (acute kidney injury) ([place])   . Goals of care, counseling/discussion   . Palliative care by specialist   . DNR (do not resuscitate) discussion   . Seizure-like activity ([place]) 11/05/2018  . Allergic rhinitis 07/24/2018  . Cardiac pacemaker in situ 07/24/2018  . History of sleep apnea 07/24/2018  . Macular degeneration 07/24/2018  . Trigeminal neuralgia pain 01/11/2018  . TMJ pain dysfunction syndrome 01/11/2018  . Recurrent UTI 09/26/2017  . 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 Pressure injury of skin 09/12/2017  . SIRS (systemic inflammatory response syndrome) ([place]) 09/10/2017  . Chronic cystitis 08/25/2017  . Sepsis ([place]) 06/10/2017  . CAP (community acquired pneumonia) 06/10/2017  . Infection due to enterococcus 06/07/2017  . UTI (urinary tract infection) 05/17/2017  . Blood in stool   . Hemorrhage of rectum and anus   . Lower GI bleed  10/20/2016  . Near syncope   . Acute renal failure superimposed on chronic kidney disease ([place])   . Pneumonia 10/09/2016  . Aspiration pneumonia ([place]) 10/09/2016  . Acute respiratory failure ([place]) 10/09/2016  . Hydronephrosis, bilateral 07/19/2016  . Spinal stenosis of lumbar region with neurogenic claudication 07/19/2016  . AAA (abdominal aortic aneurysm) without rupture ([place]) 04/27/2016  . Arthritis, degenerative 03/31/2016  . A-fib ([place]) 03/31/2016  . HLD (hyperlipidemia) 03/31/2016  . CA of prostate ([place]) 03/31/2016  . Central perforation of tympanic membrane of right ear 03/26/2016  . 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 Presbycusis of left ear with restricted hearing of right ear 03/01/2016  . Chronic kidney disease, stage 3 (moderate) ([place]) 10/30/2015  . Episode of unresponsiveness 10/30/2015  . Sick sinus syndrome ([place]) 10/30/2015  . Malignant neoplasm of overlapping sites of bladder ([place]) 09/26/2015  . Urinary retention 09/17/2015  . Bladder neoplasm of uncertain malignant potential 08/13/2015  . Thrombocytopenia, unspecified ([place]) 07/24/2015  . Fitting or adjustment of cardiac pacemaker 05/09/2015  . SVT (supraventricular tachycardia) ([place]) 04/09/2015  . Diabetes mellitus, type 2 ([place]) 04/09/2015  . Benign hypertension 04/09/2015  . Acute myocardial infarction, initial episode of care ([place]) 04/09/2015  . Phimosis 01/30/2014  . Bladder outlet obstruction 04/18/2013  . Dysuria 04/18/2013  . Gross hematuria 04/18/2013  . Urethral valve, acquired 04/18/2013  . Incomplete emptying of bladder 11/26/2012  . Personal history of prostate cancer 11/26/2012  Past Surgical History:  Procedure Laterality Date  . FLEXIBLE SIGMOIDOSCOPY N/A 10/21/2016   Procedure: FLEXIBLE SIGMOIDOSCOPY;  Surgeon: [name], MD;  Location: [facility] ENDOSCOPY;  Service: Endoscopy;  Laterality: N/A;  . JOINT REPLACEMENT    .  PACEMAKER INSERTION Left 04/14/2015   Procedure: INSERTION PACEMAKER;  Surgeon: Isaias Cowman, MD;  Location: [facility] ORS;  Service: Cardiovascular;  Laterality: Left;  . PROSTATE SURGERY    . VISCERAL ARTERY INTERVENTION N/A 10/22/2016   Procedure: Visceral Artery Intervention;  Surgeon: Algernon Huxley, MD;  Location: [place] [facility] CV LAB;  Service: Cardiovascular;  Laterality: N/A;    Prior to Admission medications   Medication Sig Start Date End Date Taking? Authorizing Provider  amLODipine (NORVASC) 2.5 MG tablet Take 1 tablet (2.5 mg total) by mouth daily. 11/10/18  Yes Sudini, Alveta Heimlich, MD  cefpodoxime (VANTIN) 200 MG tablet TAKE 1 TABLET BY MOUTH EVERY 12 HOURS FOR 7 DAYS 11/04/18  Yes [provider]  Cephalexin 500 MG tablet Take 1 tablet (500 mg total) by mouth daily. 09/06/18  Yes Stoioff, Ronda Fairly, MD  doxycycline (VIBRAMYCIN) 100 MG capsule Take 1 capsule (100 mg total) by mouth every 12 (twelve) hours. 11/21/18  Yes Stoioff, Ronda Fairly, MD  finasteride (PROSCAR) 5 MG tablet Take 5 mg by mouth daily.   Yes [provider]  glimepiride (AMARYL) 2 MG tablet 2 tablets in the morning and one tablet at night Patient taking differently: Take 4 mg by mouth 2 (two) times daily.  04/15/15  Yes [name], MD  Iron-Vitamin C (VITRON-C PO) Take 1 tablet by mouth daily.   Yes [provider]  metoprolol tartrate 37.5 MG TABS Take 37.5 mg by mouth 2 (two) times daily. Patient taking differently: Take 25 mg by mouth 2 (two) times daily.  09/13/17  Yes Gladstone Lighter, MD  Multiple Vitamins-Minerals (PRESERVISION/LUTEIN PO) Take 1 capsule by mouth 2 (two) times daily.   Yes [provider]  multivitamin-iron-minerals-folic acid (CENTRUM) chewable tablet Chew 2 tablets by mouth daily.   Yes [provider]  pantoprazole (PROTONIX) 40 MG tablet Take 40 mg by mouth daily.   Yes [provider]  rosuvastatin (CRESTOR) 5 MG tablet Take 5 mg by mouth at  bedtime.   Yes [provider]  sitaGLIPtin (JANUVIA) 50 MG tablet Take 50 mg by mouth daily.   Yes [provider]  sodium bicarbonate 650 MG tablet Take 1 tablet (650 mg total) by mouth 2 (two) times daily. 09/13/17  Yes Gladstone Lighter, MD  tamsulosin (FLOMAX) 0.4 MG CAPS capsule Take 0.4 mg by mouth daily.    Yes [provider]    Allergies Diovan [valsartan]; Gabapentin; Hydralazine; Lisinopril; Pregabalin; Levofloxacin; and Sulfamethoxazole-trimethoprim  Family History  Problem Relation Age of Onset  . Hypertension Other   . Stroke Other   . Heart attack Other   . Stroke Sister   . Heart attack Brother     Social History Social History   Tobacco Use  . Smoking status: Former Research scientist (life sciences)  . Smokeless tobacco: Never Used  Substance Use Topics  . Alcohol use: No  . Drug use: No    Review of Systems  Constitutional: No fever/chills Eyes: No visual changes. ENT: No sore throat. Cardiovascular: Denies chest pain. Respiratory: Denies shortness of breath. Gastrointestinal: No abdominal pain.  No nausea, no vomiting.  No diarrhea.  No constipation. Genitourinary: Negative for dysuria. Musculoskeletal: Negative for back pain. Skin: Negative for rash. Neurological: Positive for headaches,  no new focal weakness or numbness. ___________________________________________   PHYSICAL EXAM:  VITAL SIGNS: ED Triage Vitals  Enc Vitals Group     BP 11/21/18 1830 140/76     Pulse Rate 11/21/18 1833 90     Resp 11/21/18 1833 17     Temp 11/21/18 1835 98.8 F (37.1 C)     Temp Source 11/21/18 1835 Oral     SpO2 11/21/18 1833 97 %     Weight 11/21/18 1828 182 lb (82.6 kg)     Height 11/21/18 1828 6\' 2"  (1.88 m)     Head Circumference --      Peak Flow --      Pain Score 11/21/18 1828 0     Pain Loc --      Pain Edu? --      Excl. in [place]? --     Constitutional: Alert and oriented to person and place. Chronically appearing and in no acute  distress. Eyes: Conjunctivae are normal.  Head: Atraumatic. Nose: No congestion/rhinnorhea. Mouth/Throat: Mucous membranes are moist.  Neck: No stridor.   Cardiovascular: Irregular rhythm. Grossly normal heart sounds.  Good peripheral circulation. Respiratory: Normal respiratory effort.  No retractions. Lungs CTAB. Gastrointestinal: Soft and nontender. No distention. No abdominal bruits. No CVA tenderness. Musculoskeletal: No lower extremity tenderness. Some independent edema of the right ankle and foot.  No joint effusions. Neurologic:  Normal speech and language. No gross focal neurologic deficits.  Skin:  Skin is warm, dry and intact. No rash noted. Psychiatric: Mood and affect are normal. Speech and behavior are normal.  ____________________________________________   LABS (all labs ordered are listed, but only abnormal results are displayed)  Labs Reviewed  CBC WITH DIFFERENTIAL/PLATELET - Abnormal; Notable for the following components:      Result Value   WBC 11.0 (*)    RBC 3.64 (*)    Hemoglobin 10.1 (*)    HCT 30.9 (*)    RDW 15.9 (*)    Platelets 136 (*)    Neutro Abs 9.4 (*)    Lymphs Abs 0.6 (*)    All other components within normal limits  URINALYSIS, COMPLETE (UACMP) WITH MICROSCOPIC - Abnormal; Notable for the following components:   Color, Urine YELLOW (*)    APPearance CLOUDY (*)    Glucose, UA 50 (*)    Hgb urine dipstick LARGE (*)    Protein, ur 30 (*)    Leukocytes,Ua LARGE (*)    WBC, UA >50 (*)    All other components within normal limits  COMPREHENSIVE METABOLIC PANEL - Abnormal; Notable for the following components:   Sodium 131 (*)    CO2 20 (*)    Glucose, Bld 242 (*)    BUN 78 (*)    Creatinine, Ser 5.54 (*)    Calcium 8.1 (*)    Albumin 2.9 (*)    GFR calc non Af Amer 8 (*)    GFR calc Af Amer 9 (*)    All other components within normal limits  GLUCOSE, CAPILLARY - Abnormal; Notable for the following components:   Glucose-Capillary 240  (*)    All other components within normal limits  SARS CORONAVIRUS 2 (HOSPITAL ORDER, [facility])  URINE CULTURE  CBG MONITORING, ED   ____________________________________________  EKG  ED ECG REPORT I, Maverick Patman, FNP-BC personally viewed and interpreted this ECG.   Date: 11/21/2018  EKG Time: 6:35  Rate: 88  Rhythm: normal EKG, normal sinus rhythm, unchanged from  previous tracings, atrial fibrillation, rate 62-123  Axis: normal  Intervals:right bundle branch block and left anterior fascicular block  ST&T Change: No elevation  ____________________________________________  RADIOLOGY  ED MD interpretation:  Not indicated  Official radiology report(s): No results found.  ____________________________________________   PROCEDURES  Procedure(s) performed: None  Procedures  Critical Care performed: No  ____________________________________________   INITIAL IMPRESSION / ASSESSMENT AND PLAN / ED COURSE  85 year old male presents to the emergency department for evaluation after his PCP got the results of his labs. His GFR has fallen from 26 in January to 16 today. Patient is unaware of why he is here.   ----------------------------------------- 9:47 PM on 11/21/2018 -----------------------------------------  Discussed labs and plan with patient's daughter. Patient was able to clearly tell me her name and gave me permission to discuss his results with her. Plan will be to admit him via hospitalist service after COVID-19 is ruled out. Daughter wants to make sure that she is contacted upon admission to help answer the nursing questions. I will communicate that to the RN. Daughter also requests that he have a nephrologist consult while here. He was scheduled for a telemedicine nephrologist visit tomorrow.      ----------------------------------------- 10:05 PM on 11/21/2018 -----------------------------------------  Patient admitted through  the hospitalist service for further treatment and evaluation. ____________________________________________   FINAL CLINICAL IMPRESSION(S) / ED DIAGNOSES  Final diagnoses:  AKI (acute kidney injury) [facility])     ED Discharge Orders    None       Note:  This document was prepared using Dragon voice recognition software and may include unintentional dictation errors.    Victorino Dike, FNP 11/21/18 2205    Nena Polio, MD 11/22/18 0100

## 2018-11-21 NOTE — ED Notes (Signed)
Pts daughter: Dr Lazarus Salines wishes to be contacted ASAP regarding pts status. [phone].

## 2018-11-21 NOTE — ED Notes (Signed)
16 french foley cath placed with [facility] RN assisting.

## 2018-11-21 NOTE — ED Notes (Signed)
Pt's daughter Brunetta Genera would like to speak directly with EDP/PA. Notified PA in person.

## 2018-11-21 NOTE — ED Notes (Signed)
Attempted for 22g at L wrist without success. Not seeing/feeling any other veins to stick. Will place IV team consult.

## 2018-11-21 NOTE — ED Notes (Signed)
IV team at bedside 

## 2018-11-21 NOTE — ED Notes (Signed)
130cc on bladder scan per Wilfred Lacy NT; EDP Malinda verbal to place foley cath.

## 2018-11-21 NOTE — ED Notes (Signed)
Bladder scan preformed by this EDT, showing 131mL or urine in pt Bladder, RN notified

## 2018-11-21 NOTE — ED Notes (Signed)
Attempting for IV.

## 2018-11-22 ENCOUNTER — Inpatient Hospital Stay
Admit: 2018-11-22 | Discharge: 2018-11-22 | Disposition: A | Discharge status: Home / Self Care | Payer: Medicare Other | Attending: Nurse Practitioner | Admitting: Nurse Practitioner

## 2018-11-22 ENCOUNTER — Telehealth: Payer: Self-pay | Admitting: Urology

## 2018-11-22 LAB — CBC
HCT: 26.3 % — ABNORMAL LOW (ref 39.0–52.0)
Hemoglobin: 8.4 g/dL — ABNORMAL LOW (ref 13.0–17.0)
MCH: 27.7 pg (ref 26.0–34.0)
MCHC: 31.9 g/dL (ref 30.0–36.0)
MCV: 86.8 fL (ref 80.0–100.0)
Platelets: 120 10*3/uL — ABNORMAL LOW (ref 150–400)
RBC: 3.03 MIL/uL — ABNORMAL LOW (ref 4.22–5.81)
RDW: 15.9 % — ABNORMAL HIGH (ref 11.5–15.5)
WBC: 10.7 10*3/uL — ABNORMAL HIGH (ref 4.0–10.5)
nRBC: 0 % (ref 0.0–0.2)

## 2018-11-22 LAB — BASIC METABOLIC PANEL
Anion gap: 9 (ref 5–15)
BUN: 76 mg/dL — ABNORMAL HIGH (ref 8–23)
CO2: 19 mmol/L — ABNORMAL LOW (ref 22–32)
Calcium: 8.2 mg/dL — ABNORMAL LOW (ref 8.9–10.3)
Chloride: 107 mmol/L (ref 98–111)
Creatinine, Ser: 5.69 mg/dL — ABNORMAL HIGH (ref 0.61–1.24)
GFR calc Af Amer: 9 mL/min — ABNORMAL LOW (ref >60)
GFR calc non Af Amer: 8 mL/min — ABNORMAL LOW (ref >60)
Glucose, Bld: 152 mg/dL — ABNORMAL HIGH (ref 70–99)
Potassium: 4.2 mmol/L (ref 3.5–5.1)
Sodium: 135 mmol/L (ref 135–145)

## 2018-11-22 LAB — ECHOCARDIOGRAM COMPLETE
Height: 74 in
Weight: 3499.14 oz

## 2018-11-22 LAB — HEMOGLOBIN A1C
Hgb A1c MFr Bld: 7 % — ABNORMAL HIGH (ref 4.8–5.6)
Mean Plasma Glucose: 154.2 mg/dL

## 2018-11-22 LAB — GLUCOSE, CAPILLARY
Glucose-Capillary: 126 mg/dL — ABNORMAL HIGH (ref 70–99)
Glucose-Capillary: 148 mg/dL — ABNORMAL HIGH (ref 70–99)
Glucose-Capillary: 106 mg/dL — ABNORMAL HIGH (ref 70–99)
Glucose-Capillary: 169 mg/dL — ABNORMAL HIGH (ref 70–99)

## 2018-11-22 MED ORDER — INSULIN ASPART 100 UNIT/ML ~~LOC~~ SOLN
0.0000 [IU] | Freq: Three times a day (TID) | SUBCUTANEOUS | Status: DC
  Administered 2018-11-23: 1 [IU] via SUBCUTANEOUS
  Administered 2018-11-23 – 2018-11-24 (×2): 2 [IU] via SUBCUTANEOUS
  Administered 2018-11-25 – 2018-11-28 (×8): 1 [IU] via SUBCUTANEOUS
  Administered 2018-11-29: 2 [IU] via SUBCUTANEOUS
  Filled 2018-11-22 (×12): qty 1

## 2018-11-22 MED ORDER — INSULIN ASPART 100 UNIT/ML ~~LOC~~ SOLN
0.0000 [IU] | Freq: Three times a day (TID) | SUBCUTANEOUS | Status: DC
  Administered 2018-11-22 (×2): 2 [IU] via SUBCUTANEOUS
  Filled 2018-11-22 (×2): qty 1

## 2018-11-22 MED ORDER — SODIUM CHLORIDE 0.9 % IV SOLN
1.0000 g | INTRAVENOUS | Status: DC
  Administered 2018-11-22 – 2018-11-23 (×2): 1 g via INTRAVENOUS
  Filled 2018-11-22 (×2): qty 1

## 2018-11-22 MED ORDER — INSULIN ASPART 100 UNIT/ML ~~LOC~~ SOLN: 0.0000 [IU] | Freq: Every day | SUBCUTANEOUS | Status: DC

## 2018-11-22 MED ORDER — SODIUM CHLORIDE 0.9 % IV SOLN
INTRAVENOUS | Status: DC
  Administered 2018-11-22 – 2018-11-27 (×5): via INTRAVENOUS

## 2018-11-22 NOTE — Consult Note (Signed)
Date: 11/22/2018                  Patient Name:  Blake [name]  MRN: [id number]  DOB: Aug 13, 1922  Age / Sex: 85 y.o., male         PCP: Adin Hector, MD                 Service Requesting Consult: IM/ Epifanio Lesches, MD                 Reason for Consult: ARF            History of Present Illness: Patient is a 85 y.o. male with medical problems of high-grade bladder cancer and incompletely resected tumor, history of bilateral hydronephrosis, history of prostate cancer, recurrent UTIs, chronic bilateral hydro-nephrosis, atrial fibrillation, pacemaker/defibrillator in place who was admitted to [facility] on 11/21/2018 for evaluation of Worsening renal function.  Patient has history of advanced chronic kidney disease.  Apparently his baseline creatinine is 3.86, GFR 15.He was hospitalized in April 2020 for a seizure-like episode, confusion.  He is also dealing with recurrent UTIs.  Review of chart notes suggest that he had a cystoscopy in June 2019 by Dr. Bernardo Heater which showed high-grade appearing bladder tumor at the left lateral wall and dome.  TURBT was deferred due to patient's comorbidities and recurrent infections.  He had a detailed neurology evaluation by Dr. Diamantina Providence on April 27.  It was thought that his acute on chronic renal failure and chronic hydronephrosis was possibly due to incomplete bladder emptying, urethral stricture from radiation, locally advanced bladder or prostate cancer and possibly UTIs.  A Foley catheter was placed on April 27.  He had a repeat outpatient neurology evaluation yesterday by Dr. Bernardo Heater.  He was noted to have blood in the catheter bag and cloudy urine.  Foley catheter was removed and urine culture was obtained.  Urologist is possibly planning cystoscopy with TURBT, bladder biopsy, pyelograms and possibility ureteral stent placement in future  After Foley catheter was removed, patient was brought to the emergency room for abnormal labs A Foley  catheter was placed again which he now has approximately 1100 cc in the bag of clear yellow urine. Serum creatinine however has increased today to 5.69 up from 5.54 at the time of admission yesterday Other lab abnormalities include low albumin of 2.9 Urine culture drawn on May 12-no results available    Medications: Outpatient medications: Medications Prior to Admission  Medication Sig Dispense Refill Last Dose  . amLODipine (NORVASC) 2.5 MG tablet Take 1 tablet (2.5 mg total) by mouth daily. 30 tablet 0 unknown at unknown  . cefpodoxime (VANTIN) 200 MG tablet TAKE 1 TABLET BY MOUTH EVERY 12 HOURS FOR 7 DAYS   unknown at unknown  . Cephalexin 500 MG tablet Take 1 tablet (500 mg total) by mouth daily. 90 tablet 1 unknown at unknown  . doxycycline (VIBRAMYCIN) 100 MG capsule Take 1 capsule (100 mg total) by mouth every 12 (twelve) hours. 14 capsule 0 unknown at unknown  . finasteride (PROSCAR) 5 MG tablet Take 5 mg by mouth daily.   unknown at unknown  . glimepiride (AMARYL) 2 MG tablet 2 tablets in the morning and one tablet at night (Patient taking differently: Take 4 mg by mouth 2 (two) times daily. ) 90 tablet 11 unknown at unknown  . Iron-Vitamin C (VITRON-C PO) Take 1 tablet by mouth daily.   unknown at unknown  . metoprolol tartrate 37.5  MG TABS Take 37.5 mg by mouth 2 (two) times daily. (Patient taking differently: Take 25 mg by mouth 2 (two) times daily. ) 60 tablet 2 unknown at unknown  . Multiple Vitamins-Minerals (PRESERVISION/LUTEIN PO) Take 1 capsule by mouth 2 (two) times daily.   unknown at unknown  . multivitamin-iron-minerals-folic acid (CENTRUM) chewable tablet Chew 2 tablets by mouth daily.   unknown at unknown  . pantoprazole (PROTONIX) 40 MG tablet Take 40 mg by mouth daily.   unknown at unknown  . rosuvastatin (CRESTOR) 5 MG tablet Take 5 mg by mouth at bedtime.   unknown at unknown  . sitaGLIPtin (JANUVIA) 50 MG tablet Take 50 mg by mouth daily.   unknown at unknown  .  sodium bicarbonate 650 MG tablet Take 1 tablet (650 mg total) by mouth 2 (two) times daily. 60 tablet 0 unknown at unknown  . tamsulosin (FLOMAX) 0.4 MG CAPS capsule Take 0.4 mg by mouth daily.    unknonw at [employer]    Current medications: Current Facility-Administered Medications  Medication Dose Route Frequency Provider Last Rate Last Dose  . acetaminophen (TYLENOL) tablet 650 mg  650 mg Oral Q6H PRN Seals, Theo Dills, NP       Or  . acetaminophen (TYLENOL) suppository 650 mg  650 mg Rectal Q6H PRN Seals, Theo Dills, NP      . amLODipine (NORVASC) tablet 2.5 mg  2.5 mg Oral Daily Seals, Angela H, NP      . cefTRIAXone (ROCEPHIN) 1 g in sodium chloride 0.9 % 100 mL IVPB  1 g Intravenous Q24H Mansy, Arvella Merles, MD   Stopped at 11/22/18 [phone]  . vitamin C (ASCORBIC ACID) tablet 250 mg  250 mg Oral Daily Pernell Dupre, RPH   250 mg at 11/22/18 1093   And  . ferrous sulfate tablet 325 mg  325 mg Oral Q breakfast Pernell Dupre, RPH   325 mg at 11/22/18 2355  . finasteride (PROSCAR) tablet 5 mg  5 mg Oral Daily Seals, Angela H, NP      . insulin aspart (novoLOG) injection 0-15 Units  0-15 Units Subcutaneous TID WC Mayer Camel, NP   2 Units at 11/22/18 7322  . insulin aspart (novoLOG) injection 0-5 Units  0-5 Units Subcutaneous QHS Seals, Levada Dy H, NP      . metoprolol tartrate (LOPRESSOR) tablet 25 mg  25 mg Oral BID Seals, Angela H, NP      . multivitamins with iron tablet 1 tablet  1 tablet Oral Daily Seals, Theo Dills, NP      . ondansetron (ZOFRAN) tablet 4 mg  4 mg Oral Q6H PRN Seals, Theo Dills, NP       Or  . ondansetron (ZOFRAN) injection 4 mg  4 mg Intravenous Q6H PRN Seals, Theo Dills, NP      . pantoprazole (PROTONIX) EC tablet 40 mg  40 mg Oral Daily Seals, Angela H, NP      . polyethylene glycol (MIRALAX / GLYCOLAX) packet 17 g  17 g Oral Daily PRN Seals, Angela H, NP      . rosuvastatin (CRESTOR) tablet 5 mg  5 mg Oral QHS Seals, Angela H, NP      . sodium bicarbonate tablet 650 mg   650 mg Oral BID Seals, Angela H, NP      . tamsulosin (FLOMAX) capsule 0.4 mg  0.4 mg Oral Daily Seals, Angela H, NP      . traZODone (DESYREL) tablet 25 mg  25 mg Oral QHS PRN Seals, Theo Dills, NP          Allergies: Allergies  Allergen Reactions  . Diovan [Valsartan] Cough  . Gabapentin Other (See Comments)    Sedation at all doses  . Hydralazine Itching  . Lisinopril Cough  . Pregabalin Other (See Comments)    Sedation at all doses  . Levofloxacin Other (See Comments)    Too many side effects. Nausea, vomiting, upset stomach, increased confusion, etc Other reaction(s): Confusion Nausea, chills Nausea, chills   . Sulfamethoxazole-Trimethoprim Other (See Comments)    Too many side effects. Nausea, vomiting, upset stomach, increased confusion, etc      Past Medical History: Past Medical History:  Diagnosis Date  . AAA (abdominal aortic aneurysm) ([place])   . Arrhythmia   . Diabetes mellitus without complication ([place])   . GI bleed   . Heart murmur   . Hyperlipidemia   . Hypertension   . Pacemaker   . Prostate cancer [facility])      Past Surgical History: Past Surgical History:  Procedure Laterality Date  . FLEXIBLE SIGMOIDOSCOPY N/A 10/21/2016   Procedure: FLEXIBLE SIGMOIDOSCOPY;  Surgeon: [name], MD;  Location: [facility] ENDOSCOPY;  Service: Endoscopy;  Laterality: N/A;  . JOINT REPLACEMENT    . PACEMAKER INSERTION Left 04/14/2015   Procedure: INSERTION PACEMAKER;  Surgeon: Isaias Cowman, MD;  Location: [facility] ORS;  Service: Cardiovascular;  Laterality: Left;  . PROSTATE SURGERY    . VISCERAL ARTERY INTERVENTION N/A 10/22/2016   Procedure: Visceral Artery Intervention;  Surgeon: Algernon Huxley, MD;  Location: [place] CV LAB;  Service: Cardiovascular;  Laterality: N/A;     Family History: Family History  Problem Relation Age of Onset  . Hypertension Other   . Stroke Other   . Heart attack Other   . Stroke Sister   . Heart attack Brother      Social  History: Social History   Socioeconomic History  . Marital status: Married    Spouse name: Not on file  . Number of children: Not on file  . Years of education: Not on file  . Highest education level: Not on file  Occupational History  . Not on file  Social Needs  . Financial resource strain: Not on file  . Food insecurity:    Worry: Not on file    Inability: Not on file  . Transportation needs:    Medical: Not on file    Non-medical: Not on file  Tobacco Use  . Smoking status: Former Research scientist (life sciences)  . Smokeless tobacco: Never Used  Substance and Sexual Activity  . Alcohol use: No  . Drug use: No  . Sexual activity: Not Currently  Lifestyle  . Physical activity:    Days per week: Not on file    Minutes per session: Not on file  . Stress: Not on file  Relationships  . Social connections:    Talks on phone: Not on file    Gets together: Not on file    Attends religious service: Not on file    Active member of club or organization: Not on file    Attends meetings of clubs or organizations: Not on file    Relationship status: Not on file  . Intimate partner violence:    Fear of current or ex partner: Not on file    Emotionally abused: Not on file    Physically abused: Not on file    Forced sexual activity: Not on file  Other Topics Concern  . Not on file  Social History Narrative  . Not on file     Review of Systems: Not reliable Gen:  HEENT:  CV:  Resp:  GI: GU :  MS:  Derm:    Psych: Heme:  Neuro:  Endocrine  Vital Signs: Blood pressure (!) 146/78, pulse 84, temperature 99.1 F (37.3 C), temperature source Oral, resp. rate 16, height 6\' 2"  (1.88 m), weight 99.2 kg, SpO2 99 %.   Intake/Output Summary (Last 24 hours) at 11/22/2018 0958 Last data filed at 11/22/2018 0900 Gross per 24 hour  Intake 541.72 ml  Output 840 ml  Net -298.28 ml    Weight trends: Filed Weights   11/21/18 1828 11/21/18 2334  Weight: 82.6 kg 99.2 kg    Physical  Exam: General:  Frail, elderly gentleman, laying in the bed  HEENT  anicteric, moist oral mucous membranes  Neck:  Supple, no JVD  Lungs:  Normal breathing effort, clear to auscultation  Heart::  Irregular rhythm, prominent systolic murmur  Abdomen:  Soft, nontender  Extremities:  + Peripheral edema  Neurologic:  Lethargic, able to answer few questions  Skin:  No acute rashes  Access:   Foley:  Catheter present with clear yellow urine       Lab results: Basic Metabolic Panel: Recent Labs  Lab 11/21/18 1948 11/22/18 0334  NA 131* 135  K 4.6 4.2  CL 99 107  CO2 20* 19*  GLUCOSE 242* 152*  BUN 78* 76*  CREATININE 5.54* 5.69*  CALCIUM 8.1* 8.2*    Liver Function Tests: Recent Labs  Lab 11/21/18 1948  AST 34  ALT 32  ALKPHOS 82  BILITOT 0.4  PROT 7.7  ALBUMIN 2.9*   No results for input(s): LIPASE, AMYLASE in the last 168 hours. No results for input(s): AMMONIA in the last 168 hours.  CBC: Recent Labs  Lab 11/21/18 1948 11/22/18 0334  WBC 11.0* 10.7*  NEUTROABS 9.4*  --   HGB 10.1* 8.4*  HCT 30.9* 26.3*  MCV 84.9 86.8  PLT 136* 120*    Cardiac Enzymes: No results for input(s): CKTOTAL, TROPONINI in the last 168 hours.  BNP: Invalid input(s): POCBNP  CBG: Recent Labs  Lab 11/21/18 1933 11/21/18 2342 11/22/18 0759  GLUCAP 240* 173* 126*    Microbiology: Recent Results (from the past 720 hour(s))  Urine Culture     Status: Abnormal   Collection Time: 11/05/18  8:27 PM  Result Value Ref Range Status   Specimen Description   Final    URINE, RANDOM Performed at [facility], [address], [place], [place] [address]    Special Requests   Final    NONE Performed at [facility], [address] [place], [place] [address]    Culture MULTIPLE SPECIES PRESENT, SUGGEST RECOLLECTION (A)  Final   Report Status 11/07/2018 FINAL  Final  Culture, blood (routine x 2) Call MD if unable to obtain prior to antibiotics being given      Status: None   Collection Time: 11/06/18 12:46 AM  Result Value Ref Range Status   Specimen Description BLOOD LEFT ANTECUBITAL  Final   Special Requests   Final    BOTTLES DRAWN AEROBIC AND ANAEROBIC Blood Culture adequate volume   Culture   Final    NO GROWTH 5 DAYS Performed at [facility], [address], [place],  [address]    Report Status 11/11/2018 FINAL  Final  Culture, blood (routine x  2) Call MD if unable to obtain prior to antibiotics being given     Status: None   Collection Time: 11/06/18 12:52 AM  Result Value Ref Range Status   Specimen Description BLOOD LEFT FOREARM  Final   Special Requests   Final    BOTTLES DRAWN AEROBIC AND ANAEROBIC Blood Culture adequate volume   Culture   Final    NO GROWTH 5 DAYS Performed at [facility], [address] [place], [place] [address]    Report Status 11/11/2018 FINAL  Final  MRSA PCR Screening     Status: None   Collection Time: 11/07/18  3:02 PM  Result Value Ref Range Status   MRSA by PCR NEGATIVE NEGATIVE Final    Comment:        The GeneXpert MRSA Assay (FDA approved for NASAL specimens only), is one component of a comprehensive MRSA colonization surveillance program. It is not intended to diagnose MRSA infection nor to guide or monitor treatment for MRSA infections. Performed at [facility], [address] [place], [place] [address]   Microscopic Examination     Status: Abnormal   Collection Time: 11/21/18 12:58 PM  Result Value Ref Range Status   WBC, UA >30 (A) 0 - 5 /hpf Final   RBC 3-10 (A) 0 - 2 /hpf Final   Epithelial Cells (non renal) None seen 0 - 10 /hpf Final   Bacteria, UA Moderate (A) None seen/Few Final  SARS Coronavirus 2 [facility] order, Performed in [place] hospital lab)     Status: None   Collection Time: 11/21/18  9:43 PM  Result Value Ref Range Status   SARS Coronavirus 2 NEGATIVE NEGATIVE Final    Comment: (NOTE) If result is  NEGATIVE SARS-CoV-2 target nucleic acids are NOT DETECTED. The SARS-CoV-2 RNA is generally detectable in upper and lower  respiratory specimens during the acute phase of infection. The lowest  concentration of SARS-CoV-2 viral copies this assay can detect is 250  copies / mL. A negative result does not preclude SARS-CoV-2 infection  and should not be used as the sole basis for treatment or other  patient management decisions.  A negative result may occur with  improper specimen collection / handling, submission of specimen other  than nasopharyngeal swab, presence of viral mutation(s) within the  areas targeted by this assay, and inadequate number of viral copies  (<250 copies / mL). A negative result must be combined with clinical  observations, patient history, and epidemiological information. If result is POSITIVE SARS-CoV-2 target nucleic acids are DETECTED. The SARS-CoV-2 RNA is generally detectable in upper and lower  respiratory specimens dur ing the acute phase of infection.  Positive  results are indicative of active infection with SARS-CoV-2.  Clinical  correlation with patient history and other diagnostic information is  necessary to determine patient infection status.  Positive results do  not rule out bacterial infection or co-infection with other viruses. If result is PRESUMPTIVE POSTIVE SARS-CoV-2 nucleic acids MAY BE PRESENT.   A presumptive positive result was obtained on the submitted specimen  and confirmed on repeat testing.  While 2019 novel coronavirus  (SARS-CoV-2) nucleic acids may be present in the submitted sample  additional confirmatory testing may be necessary for epidemiological  and / or clinical management purposes  to differentiate between  SARS-CoV-2 and other Sarbecovirus currently known to infect humans.  If clinically indicated additional testing with an alternate test  methodology [phone] is advised. The SARS-CoV-2 RNA is generally  detectable  in upper and lower respiratory sp ecimens during the acute  phase of infection. The expected result is Negative. Fact Sheet for Patients:  StrictlyIdeas.no Fact Sheet for Healthcare Providers: BankingDealers.co.za This test is not yet approved or cleared by the [place] FDA and has been authorized for detection and/or diagnosis of SARS-CoV-2 by FDA under an Emergency Use Authorization (EUA).  This EUA will remain in effect (meaning this test can be used) for the duration of the COVID-19 declaration under Section 564(b)(1) of the Act, 21 U.S.C. section 360bbb-3(b)(1), unless the authorization is terminated or revoked sooner. Performed at [facility], [address] [place], [place] [address]      Coagulation Studies: No results for input(s): LABPROT, INR in the last 72 hours.  Urinalysis: Recent Labs    11/21/18 1258 11/21/18 1948  COLORURINE  --  YELLOW*  LABSPEC  --  1.008  PHURINE  --  6.0  GLUCOSEU Negative 50*  HGBUR  --  LARGE*  BILIRUBINUR Negative NEGATIVE  KETONESUR  --  NEGATIVE  PROTEINUR 2+* 30*  NITRITE Negative NEGATIVE  LEUKOCYTESUR 3+* LARGE*        Imaging:  No results found.   Assessment & Plan: Pt is a 85 y.o. African-American  male with medical problems of high-grade bladder cancer and incompletely resected tumor, history of bilateral hydronephrosis, history of prostate cancer, recurrent UTIs, chronic bilateral hydro-nephrosis, atrial fibrillation, pacemaker/defibrillator in place who was admitted to [facility] on 11/21/2018 for evaluation of Worsening renal function.    1.  Acute renal failure on chronic kidney disease stage IV.  Baseline creatinine 3.86/GFR 15 from November 09, 2018 2.  Chronic bilateral hydronephrosis 3.  Bladder tumor 4.  Urinary tract infection 5. LE edema  Differential diagnosis of worsening renal failure includes bladder outlet obstruction, UTI Patient is  nonoliguric with good urine output BUN, creatinine are critically elevated Had a detailed discussion with patient's daughter, Dr. Miquel Dunn, DDS. [phone] Plan is to wait and see if renal function improves with new Foley catheter that was placed this admission Follow-up on urine cultures.  Patient is getting empiric antibiotics Rocephin There is a possibility uremia is playing a role in altered mental status.  Patient's baseline functional status is gradually able to walk with a walker.  His daughter states that he is usually alert and has good mental status.  No history of dementia. Obtain 24-hour urine for creatinine clearance If renal function does not improve, may need to consider trial of hemodialysis       LOS: [place] 5/13/20209:58 AM  [place], [place]  Note: This note was prepared with Dragon dictation. Any transcription errors are unintentional

## 2018-11-22 NOTE — Progress Notes (Signed)
Admitted this morning for acute renal failure on top of chronic renal failure with altered mental status. 1.  Acute renal failure on top of chronic kidney disease.  Continue IV fluids, seen by nephrology, recently seen by urology, admitted in April, has Foley catheter placed on April 27. 2.  High-grade bladder cell cancer, incompletely resected tumor, history of bilateral hydronephrosis, prostate cancer, recurrent UTIs, chronic bilateral hydronephrosis, recently admitted in April for seizure-like episode, confusion, #3 recurrent urine retention, Foley was placed again yesterday, seen by nephrology today, nephrology spoke with patient's daughter was increased, would like to wait and watch and see if urine function improves with Foley catheter, antibiotics for UTI, 4.  Uremia with altered mental status.  Remember this patient from previous admission, hopefully once urine retention and UTI correct we will see if mental status improves. ,

## 2018-11-22 NOTE — Progress Notes (Signed)
Inpatient Diabetes Program Recommendations  AACE/ADA: New Consensus Statement on Inpatient Glycemic Control (2015)  Target Ranges:  Prepandial:   less than 140 mg/dL      Peak postprandial:   less than 180 mg/dL (1-2 hours)      Critically ill patients:  140 - 180 mg/dL   Lab Results  Component Value Date   GLUCAP 148 (H) 11/22/2018   HGBA1C 7.0 (H) 11/22/2018    Review of Glycemic Control Results for Blake [name], Blake [name] (MRN [id number]) as of 11/22/2018 13:16  Ref. Range 11/21/2018 19:33 11/21/2018 23:42 11/22/2018 07:59 11/22/2018 12:10  Glucose-Capillary Latest Ref Range: 70 - 99 mg/dL 240 (H) 173 (H) 126 (H) 148 (H)   Diabetes history: DM 2 Outpatient Diabetes medications: Amaryl 4 mg bid, Januvia 50 mg daily Current orders for Inpatient glycemic control:  Novolog moderate tid with meals and HS  Inpatient Diabetes Program Recommendations:   Please consider reducing Novolog correction to sensitive tid with meals and HS.   Thanks,  Adah Perl, RN, BC-ADM Inpatient Diabetes Coordinator Pager [phone] (8a-5p)

## 2018-11-22 NOTE — Telephone Encounter (Signed)
Pt son called office stating pt was seen yesterday and was advised to call office with any questions or concerns. Sirr, the pt son asks for a call back. Please advise [phone].

## 2018-11-22 NOTE — H&P (Signed)
Attending physician admission note:  I have seen and examined the patient with Ms. Gardiner Barefoot, CRNP on 11/21/2018.  The patient presents with acute kidney injury on top of chronic kidney disease, with altered mental status and was sent by Dr. Bernardo Heater, his urologist to the ER.  He was noted to have a UTI.  Upon physical examination:  Generally: Pleasant elderly African-American male in no acute distress Cardiovascular: Regular rate and rhythm with occasional extra beats with normal S1-S2 and no murmurs gallops or rubs. Respiratory: Clear to auscultation bilaterally Abdomen: Soft, nontender, nondistended with positive bowel sounds and no palpable organomegaly or masses. Extremities: No edema clubbing or cyanosis Neurologic: Grossly nonfocal  Labs and radiographic studies: were all reviewed  Assessment/plan: The patient will be admitted to medical monitored bed for further management of acute kidney injury on top of chronic kidney disease, with IV hydration and follow-up BMP as well as management of his UTI with IV Rocephin.  For further details please refer to dictated admission H&P.  I have discussed the case with my nurse practitioner. I agree with the admission note and the rest of the plan of care as delineated by Ms. Gardiner Barefoot, CRNP.

## 2018-11-22 NOTE — Telephone Encounter (Signed)
Called pt's son he states that he wanted to inform us that his dad was admitted to the hospital last night due to kidneys being worse based on labs. Provider informed.

## 2018-11-22 NOTE — Progress Notes (Signed)
*  PRELIMINARY RESULTS* Echocardiogram 2D Echocardiogram has been performed.  Wallie Char Fabio Wah 11/22/2018, 11:10 AM

## 2018-11-22 NOTE — Progress Notes (Signed)
[facility] at [place] NAME: Blake [name]    MR#:  [id number]  DATE OF BIRTH:  09/22/1922  Admitted for altered mental status, acute on chronic renal failure.  Still lethargic.  CHIEF COMPLAINT:   Chief Complaint  Patient presents with  . Abnormal Lab    REVIEW OF SYSTEMS:   Review of Systems  Unable to perform ROS: Medical condition    DRUG ALLERGIES:   Allergies  Allergen Reactions  . Diovan [Valsartan] Cough  . Gabapentin Other (See Comments)    Sedation at all doses  . Hydralazine Itching  . Lisinopril Cough  . Pregabalin Other (See Comments)    Sedation at all doses  . Levofloxacin Other (See Comments)    Too many side effects. Nausea, vomiting, upset stomach, increased confusion, etc Other reaction(s): Confusion Nausea, chills Nausea, chills   . Sulfamethoxazole-Trimethoprim Other (See Comments)    Too many side effects. Nausea, vomiting, upset stomach, increased confusion, etc    VITALS:  Blood pressure 133/85, pulse 76, temperature 98.9 F (37.2 C), temperature source Oral, resp. rate 18, height 6\' 2"  (1.88 m), weight 99.2 kg, SpO2 100 %.  PHYSICAL EXAMINATION:  GENERAL:  85 y.o.-year-old patient lying in the bed with no acute distress.  EYES: Pupils equal, round, reactive to lightNo scleral icterus. Extraocular muscles intact.  HEENT: Head atraumatic, normocephalic. Oropharynx and nasopharynx clear.  NECK:  Supple, no jugular venous distention. No thyroid enlargement, no tenderness.  LUNGS: Normal breath sounds bilaterally, no wheezing, rales,rhonchi or crepitation. No use of accessory muscles of respiration.  CARDIOVASCULAR: S1, S2 normal. No murmurs, rubs, or gallops.  ABDOMEN: Soft, nontender, nondistended. Bowel sounds present. No organomegaly or mass.  EXTREMITIES: No pedal edema, cyanosis, or clubbing.  NEUROLOGIC: Patient is lethargic, unable to follow commands for full neuro exam, no gross  neurological deficit. PSYCHIATRIC: Lethargic.Marland Kitchen  SKIN: No obvious rash, lesion, or ulcer.    LABORATORY PANEL:   CBC Recent Labs  Lab 11/22/18 0334  WBC 10.7*  HGB 8.4*  HCT 26.3*  PLT 120*   ------------------------------------------------------------------------------------------------------------------  Chemistries  Recent Labs  Lab 11/21/18 1948 11/22/18 0334  NA 131* 135  K 4.6 4.2  CL 99 107  CO2 20* 19*  GLUCOSE 242* 152*  BUN 78* 76*  CREATININE 5.54* 5.69*  CALCIUM 8.1* 8.2*  AST 34  --   ALT 32  --   ALKPHOS 82  --   BILITOT 0.4  --    ------------------------------------------------------------------------------------------------------------------  Cardiac Enzymes No results for input(s): TROPONINI in the last 168 hours. ------------------------------------------------------------------------------------------------------------------  RADIOLOGY:  No results found.  EKG:   Orders placed or performed during the hospital encounter of 11/21/18  . EKG 12-Lead  . EKG 12-Lead    ASSESSMENT AND PLAN:   Acute on chronic renal failure, chronic bilateral hydronephrosis, patient had a Foley catheter was placed in April, creatinine decreased from 5-3.86, brought him to urology office yesterday by son because of worsening mental status, patient noted to have worsening creatinine up to 5 this time so admitted to hospital for acute on chronic renal failure, on IV fluids.  Patient still has elevated creatinine up to 5.69.,  Patient received a liter of fluid in the emergency room. #2 .recurrent urine retention, Foley catheter reinserted.  In the emergency room.  Patient noted to have purulent urine coming out of Foley catheter so Foley catheter was clamped with and urine was collected for culture as per Dr. Bernardo Heater note,  Foley catheter was removed, in his office, patient brought to emergency room, catheter was reinserted with good amount of urine, #3. metabolic  encephalopathy likely due to UTI, follow urine cultures, continue Rocephin. All the records are reviewed and case discussed with Care Management/Social Workerr. Management plans discussed with the patient, family and they are in agreement. Than 50% time spent in counseling, coordination of care.  Dr. Candiss Norse spoke with patient's daughter who is the dentist.  Call the daughter again tomorrow. CODE STATUS: Full code TOTAL TIME TAKING CARE OF THIS PATIENT: 38 minutes.   POSSIBLE D/C IN 2-3 DAYS, DEPENDING ON CLINICAL CONDITION.   Epifanio Lesches M.D on 11/22/2018 at 1:35 PM  Between 7am to 6pm - Pager - [phone]  After 6pm go to [email or web address] - password [email or web address] [facility]  Office  [phone]  CC: Primary care physician; Adin Hector, MD   Note: This dictation was prepared with Dragon dictation along with smaller phrase technology. Any transcriptional errors that result from this process are unintentional.

## 2018-11-22 NOTE — H&P (Addendum)
[facility] at [place] NAME: Blake [name]    MR#:  [id number]  DATE OF BIRTH:  1922/10/28  DATE OF ADMISSION:  11/21/2018  PRIMARY CARE PHYSICIAN: Adin Hector, MD   REQUESTING/REFERRING PHYSICIAN:Cari Triplett, CRNP  CHIEF COMPLAINT:   Chief Complaint  Patient presents with  . Abnormal Lab    HISTORY OF PRESENT ILLNESS:  Blake [name]  is a 85 y.o. male with a known history of diabetes mellitus, hypertension, CKD with recent acute on chronic renal failure on 11/05/2018. This patient was seen by me at 2130 on 11/21/18. At that time his initial creatinine was 5.8 which returned to baseline prior to discharge at 2.5.  He now has been referred to the emergency room for repeat acute on chronic renal failure by his urologist Dr. Elenor Quinones.  On arrival creatinine is 5.54 with BUN of 78.  Patient is slightly confused.  However he denies acute complaints of chest pain, shortness of breath, nausea, vomiting, fever, chills, diarrhea. On review of past medical records, he does have a history of recurrent urinary tract infections.  At this time he denies dysuria, frequency, urgency, or hematuria.  He has had recent renal ultrasound demonstrating normal size kidneys with moderate bilateral chronic hydronephrosis. No focal mass or nephrolithiasis.  Mild echogenic material along the dependent portion of the bladder which may be due to wall thickening versus dependent debris.  He has a history of chronic atrial fibrillation and is currently not on anticoagulant therapy with a recent history of GI bleed.   We have admitted him to the hospitalist service for further evaluation of acute on chronic renal failure.  PAST MEDICAL HISTORY:   Past Medical History:  Diagnosis Date  . AAA (abdominal aortic aneurysm) ([place])   . Arrhythmia   . Diabetes mellitus without complication ([place])   . GI bleed   . Heart murmur   . Hyperlipidemia   . Hypertension   .  Pacemaker   . Prostate cancer ([place])     PAST SURGICAL HISTORY:   Past Surgical History:  Procedure Laterality Date  . FLEXIBLE SIGMOIDOSCOPY N/A 10/21/2016   Procedure: FLEXIBLE SIGMOIDOSCOPY;  Surgeon: [name], MD;  Location: [facility] ENDOSCOPY;  Service: Endoscopy;  Laterality: N/A;  . JOINT REPLACEMENT    . PACEMAKER INSERTION Left 04/14/2015   Procedure: INSERTION PACEMAKER;  Surgeon: Isaias Cowman, MD;  Location: [facility] ORS;  Service: Cardiovascular;  Laterality: Left;  . PROSTATE SURGERY    . VISCERAL ARTERY INTERVENTION N/A 10/22/2016   Procedure: Visceral Artery Intervention;  Surgeon: Algernon Huxley, MD;  Location: [facility];  Service: Cardiovascular;  Laterality: N/A;    SOCIAL HISTORY:   Social History   Tobacco Use  . Smoking status: Former Research scientist (life sciences)  . Smokeless tobacco: Never Used  Substance Use Topics  . Alcohol use: No    FAMILY HISTORY:   Family History  Problem Relation Age of Onset  . Hypertension Other   . Stroke Other   . Heart attack Other   . Stroke Sister   . Heart attack Brother     DRUG ALLERGIES:   Allergies  Allergen Reactions  . Diovan [Valsartan] Cough  . Gabapentin Other (See Comments)    Sedation at all doses  . Hydralazine Itching  . Lisinopril Cough  . Pregabalin Other (See Comments)    Sedation at all doses  . Levofloxacin Other (See Comments)    Too many side effects. Nausea,  vomiting, upset stomach, increased confusion, etc Other reaction(s): Confusion Nausea, chills Nausea, chills   . Sulfamethoxazole-Trimethoprim Other (See Comments)    Too many side effects. Nausea, vomiting, upset stomach, increased confusion, etc    REVIEW OF SYSTEMS:   Review of Systems  Constitutional: Negative for chills and fever.  HENT: Negative for congestion and sinus pain.   Eyes: Negative for blurred vision and double vision.  Respiratory: Negative for hemoptysis and shortness of breath.   Cardiovascular: Negative for chest  pain and palpitations.  Gastrointestinal: Negative for abdominal pain, diarrhea, nausea and vomiting.  Genitourinary: Negative for dysuria, flank pain, frequency, hematuria and urgency.  Musculoskeletal: Negative for falls and myalgias.  Neurological: Negative for dizziness, weakness and headaches.  Psychiatric/Behavioral:       Pleasantly confused      MEDICATIONS AT HOME:   Prior to Admission medications   Medication Sig Start Date End Date Taking? Authorizing Provider  amLODipine (NORVASC) 2.5 MG tablet Take 1 tablet (2.5 mg total) by mouth daily. 11/10/18  Yes Sudini, Alveta Heimlich, MD  cefpodoxime (VANTIN) 200 MG tablet TAKE 1 TABLET BY MOUTH EVERY 12 HOURS FOR 7 DAYS 11/04/18  Yes [provider]  Cephalexin 500 MG tablet Take 1 tablet (500 mg total) by mouth daily. 09/06/18  Yes Stoioff, Ronda Fairly, MD  doxycycline (VIBRAMYCIN) 100 MG capsule Take 1 capsule (100 mg total) by mouth every 12 (twelve) hours. 11/21/18  Yes Stoioff, Ronda Fairly, MD  finasteride (PROSCAR) 5 MG tablet Take 5 mg by mouth daily.   Yes [provider]  glimepiride (AMARYL) 2 MG tablet 2 tablets in the morning and one tablet at night Patient taking differently: Take 4 mg by mouth 2 (two) times daily.  04/15/15  Yes [name], MD  Iron-Vitamin C (VITRON-C PO) Take 1 tablet by mouth daily.   Yes [provider]  metoprolol tartrate 37.5 MG TABS Take 37.5 mg by mouth 2 (two) times daily. Patient taking differently: Take 25 mg by mouth 2 (two) times daily.  09/13/17  Yes Gladstone Lighter, MD  Multiple Vitamins-Minerals (PRESERVISION/LUTEIN PO) Take 1 capsule by mouth 2 (two) times daily.   Yes [provider]  multivitamin-iron-minerals-folic acid (CENTRUM) chewable tablet Chew 2 tablets by mouth daily.   Yes [provider]  pantoprazole (PROTONIX) 40 MG tablet Take 40 mg by mouth daily.   Yes [provider]  rosuvastatin (CRESTOR) 5 MG tablet Take 5 mg by mouth at  bedtime.   Yes [provider]  sitaGLIPtin (JANUVIA) 50 MG tablet Take 50 mg by mouth daily.   Yes [provider]  sodium bicarbonate 650 MG tablet Take 1 tablet (650 mg total) by mouth 2 (two) times daily. 09/13/17  Yes Gladstone Lighter, MD  tamsulosin (FLOMAX) 0.4 MG CAPS capsule Take 0.4 mg by mouth daily.    Yes [provider]      VITAL SIGNS:  Blood pressure (!) 146/78, pulse 84, temperature 99.1 F (37.3 C), temperature source Oral, resp. rate 16, height 6\' 2"  (1.88 m), weight 99.2 kg, SpO2 99 %.  PHYSICAL EXAMINATION:  Physical Exam Constitutional:      Appearance: Normal appearance.  HENT:     Head: Normocephalic.     Right Ear: External ear normal.     Left Ear: External ear normal.     Nose: Nose normal. No congestion.     Mouth/Throat:     Mouth: Mucous membranes are moist.     Pharynx: Oropharynx  is clear.  Eyes:     General: No scleral icterus.    Pupils: Pupils are equal, round, and reactive to light.  Neck:     Musculoskeletal: Neck supple. No muscular tenderness.  Cardiovascular:     Rate and Rhythm: Normal rate and regular rhythm.     Pulses: Normal pulses.     Heart sounds: Normal heart sounds.  Pulmonary:     Effort: Pulmonary effort is normal.     Breath sounds: Normal breath sounds.  Abdominal:     General: Bowel sounds are normal. There is no distension.     Palpations: Abdomen is soft.     Tenderness: There is no abdominal tenderness.     Hernia: No hernia is present.  Musculoskeletal: Normal range of motion.        General: No swelling or tenderness.  Skin:    General: Skin is warm and dry.     Capillary Refill: Capillary refill takes less than 2 seconds.     Findings: No rash.  Neurological:     Mental Status: He is alert. Mental status is at baseline. He is disoriented.     Motor: No weakness.  Psychiatric:        Mood and Affect: Mood normal.       LABORATORY PANEL:   CBC Recent Labs  Lab 11/22/18  0334  WBC 10.7*  HGB 8.4*  HCT 26.3*  PLT 120*   ------------------------------------------------------------------------------------------------------------------  Chemistries  Recent Labs  Lab 11/21/18 1948 11/22/18 0334  NA 131* 135  K 4.6 4.2  CL 99 107  CO2 20* 19*  GLUCOSE 242* 152*  BUN 78* 76*  CREATININE 5.54* 5.69*  CALCIUM 8.1* 8.2*  AST 34  --   ALT 32  --   ALKPHOS 82  --   BILITOT 0.4  --    ------------------------------------------------------------------------------------------------------------------  Cardiac Enzymes No results for input(s): TROPONINI in the last 168 hours. ------------------------------------------------------------------------------------------------------------------  RADIOLOGY:  No results found.    IMPRESSION AND PLAN:   1. acute on chronic renal failure --Given patient's confusional state at the time I am seeing him and advanced age, this may be related to decreased p.o. intake.  Patient has been started on IV fluids for resuscitation - He had recent renal ultrasound during previous admission on 11/04/2017 demonstrating Normal size kidneys with moderate bilateral chronic hydronephrosis. No focal mass or nephrolithiasis and mild echogenic material along the dependent portion of the bladder which may be due to wall thickening versus dependent debris. --We will consult nephrology for further evaluation and recommendations. --We will get echocardiogram given patient's history of chronic atrial fibrillation status post pacemaker placement   2.  UTI - Will treat with IV Rocephin - Urine culture pending -  CBC in the a.m. - Continue to monitor renal function  3.  Chronic atrial fibrillation - Status post pacemaker placement - Patient has history of GI bleed.  Not on chronic anticoagulant therapy - Continue metoprolol - Telemetry monitoring  4.  History of bladder cancer and BPH -Followed by Dr Bernardo Heater. - Continue  finasteride and Flomax  5.  Diabetes mellitus --Moderate sliding scale insulin  6.  Hyperlipidemia - Stable-continue Crestor  7.  History of GI bleed - We will continue to monitor hemoglobin     All the records are reviewed and case discussed with ED provider. The plan of care was discussed in details with the patient (and family). I answered all questions. The patient agreed to proceed with  the above mentioned plan. Further management will depend upon hospital course.   CODE STATUS: Full code  TOTAL TIME TAKING CARE OF THIS PATIENT: 45 minutes.    Mathews on 11/22/2018 at 7:32 AM  Pager - [phone]  After 6pm go to [email or web address] - password [email or web address] [facility]  [employer] [facility] [facility]  Office  [phone]  CC: Primary care physician; Adin Hector, MD   Note: This dictation was prepared with Dragon dictation along with smaller phrase technology. Any transcriptional errors that result from this process are unintentional.

## 2018-11-23 LAB — RENAL FUNCTION PANEL
Albumin: 2.5 g/dL — ABNORMAL LOW (ref 3.5–5.0)
Anion gap: 12 (ref 5–15)
BUN: 74 mg/dL — ABNORMAL HIGH (ref 8–23)
CO2: 20 mmol/L — ABNORMAL LOW (ref 22–32)
Calcium: 7.9 mg/dL — ABNORMAL LOW (ref 8.9–10.3)
Chloride: 109 mmol/L (ref 98–111)
Creatinine, Ser: 5.2 mg/dL — ABNORMAL HIGH (ref 0.61–1.24)
GFR calc Af Amer: 10 mL/min — ABNORMAL LOW (ref >60)
GFR calc non Af Amer: 9 mL/min — ABNORMAL LOW (ref >60)
Glucose, Bld: 128 mg/dL — ABNORMAL HIGH (ref 70–99)
Phosphorus: 5.1 mg/dL — ABNORMAL HIGH (ref 2.5–4.6)
Potassium: 4.1 mmol/L (ref 3.5–5.1)
Sodium: 141 mmol/L (ref 135–145)

## 2018-11-23 LAB — HEMOGLOBIN AND HEMATOCRIT, BLOOD
HCT: 25.5 % — ABNORMAL LOW (ref 39.0–52.0)
Hemoglobin: 8 g/dL — ABNORMAL LOW (ref 13.0–17.0)

## 2018-11-23 LAB — CREATININE CLEARANCE, URINE, 24 HOUR
Collection Interval-CRCL: 24 hours
Creatinine Clearance: 17 mL/min — ABNORMAL LOW (ref 75–125)
Creatinine, 24H Ur: 1290 mg/d (ref 800–2000)
Creatinine, Urine: 43 mg/dL
Urine Total Volume-CRCL: 3000 mL

## 2018-11-23 LAB — PROTEIN / CREATININE RATIO, URINE
Creatinine, Urine: 42 mg/dL
Protein Creatinine Ratio: 1.57 mg/mg{Cre} — ABNORMAL HIGH (ref 0.00–0.15)
Total Protein, Urine: 66 mg/dL

## 2018-11-23 LAB — GLUCOSE, CAPILLARY
Glucose-Capillary: 105 mg/dL — ABNORMAL HIGH (ref 70–99)
Glucose-Capillary: 175 mg/dL — ABNORMAL HIGH (ref 70–99)
Glucose-Capillary: 137 mg/dL — ABNORMAL HIGH (ref 70–99)
Glucose-Capillary: 142 mg/dL — ABNORMAL HIGH (ref 70–99)

## 2018-11-23 MED ORDER — SODIUM CHLORIDE 0.9 % IV SOLN
2.0000 g | Freq: Once | INTRAVENOUS | Status: AC
  Administered 2018-11-23: 2 g via INTRAVENOUS
  Filled 2018-11-23: qty 2

## 2018-11-23 MED ORDER — SODIUM CHLORIDE 0.9 % IV SOLN
1.0000 g | Freq: Two times a day (BID) | INTRAVENOUS | Status: DC
  Filled 2018-11-23 (×2): qty 1

## 2018-11-23 MED ORDER — SODIUM CHLORIDE 0.9 % IV SOLN
500.0000 mg | Freq: Two times a day (BID) | INTRAVENOUS | Status: DC
  Administered 2018-11-23: 500 mg via INTRAVENOUS
  Filled 2018-11-23 (×2): qty 0.5
  Filled 2018-11-23: qty 500

## 2018-11-23 MED ORDER — DEXTROSE 5 % IV SOLN
0.5000 g | INTRAVENOUS | Status: DC
  Filled 2018-11-23: qty 0.5

## 2018-11-23 NOTE — Progress Notes (Addendum)
[facility] at [place] NAME: Blake [name]    MR#:  [id number]  DATE OF BIRTH:  01/15/1923  Admitted for acute renal failure, altered mental status, improving mental status, he is more alert today.  CHIEF COMPLAINT:   Chief Complaint  Patient presents with  . Abnormal Lab    REVIEW OF SYSTEMS:   Review of Systems  Unable to perform ROS: Medical condition    DRUG ALLERGIES:   Allergies  Allergen Reactions  . Diovan [Valsartan] Cough  . Gabapentin Other (See Comments)    Sedation at all doses  . Hydralazine Itching  . Lisinopril Cough  . Pregabalin Other (See Comments)    Sedation at all doses  . Levofloxacin Other (See Comments)    Too many side effects. Nausea, vomiting, upset stomach, increased confusion, etc Other reaction(s): Confusion Nausea, chills Nausea, chills   . Sulfamethoxazole-Trimethoprim Other (See Comments)    Too many side effects. Nausea, vomiting, upset stomach, increased confusion, etc    VITALS:  Blood pressure (!) 144/99, pulse 73, temperature 98.4 F (36.9 C), temperature source Oral, resp. rate 18, height 6\' 2"  (1.88 m), weight 99.2 kg, SpO2 100 %.  PHYSICAL EXAMINATION:  GENERAL:  85 y.o.-year-old patient lying in the bed with no acute distress.  EYES: Pupils equal, round, reactive to lightNo scleral icterus. Extraocular muscles intact.  HEENT: Head atraumatic, normocephalic. Oropharynx and nasopharynx clear.  NECK:  Supple, no jugular venous distention. No thyroid enlargement, no tenderness.  LUNGS: Normal breath sounds bilaterally, no wheezing, rales,rhonchi or crepitation. No use of accessory muscles of respiration.  CARDIOVASCULAR: S1, S2 normal. No murmurs, rubs, or gallops.  ABDOMEN: Soft, nontender, nondistended. Bowel sounds present. No organomegaly or mass.  EXTREMITIES: No pedal edema, cyanosis, or clubbing.  NEUROLOGIC:, Awake, Oriented.  PSYCHIATRIC alert, awake, oriented.Marland Kitchen  SKIN:  No obvious rash, lesion, or ulcer.    LABORATORY PANEL:   CBC Recent Labs  Lab 11/22/18 0334 11/23/18 0345  WBC 10.7*  --   HGB 8.4* 8.0*  HCT 26.3* 25.5*  PLT 120*  --    ------------------------------------------------------------------------------------------------------------------  Chemistries  Recent Labs  Lab 11/21/18 1948  11/23/18 0345  NA 131*   < > 141  K 4.6   < > 4.1  CL 99   < > 109  CO2 20*   < > 20*  GLUCOSE 242*   < > 128*  BUN 78*   < > 74*  CREATININE 5.54*   < > 5.20*  CALCIUM 8.1*   < > 7.9*  AST 34  --   --   ALT 32  --   --   ALKPHOS 82  --   --   BILITOT 0.4  --   --    < > = values in this interval not displayed.   ------------------------------------------------------------------------------------------------------------------  Cardiac Enzymes No results for input(s): TROPONINI in the last 168 hours. ------------------------------------------------------------------------------------------------------------------  RADIOLOGY:  No results found.  EKG:   Orders placed or performed during the hospital encounter of 11/21/18  . EKG 12-Lead  . EKG 12-Lead    ASSESSMENT AND PLAN:   Acute on chronic renal failure, chronic bilateral hydronephrosis, patient had a Foley catheter was placed in April, creatinine decreased from 5.6-5.2 today.  Brought him to urology office  by son because of worsening mental status,  Acute renal failure is improving with hydration.    #2.  Sepsis with Pseudomonas in the  urine, change antibiotics to  meropenem, await for final results.   #3. metabolic encephalopathy likely due to UTI, sepsis,  improving.  Continue Flomax.   continue all the records are reviewed and case discussed with Care Management/Social Workerr. Management plans discussed with the patient, family and they are in agreement. Than 50% time spent in counseling, coordination of care.  Dr. Candiss Norse spoke with patient's daughter who is the dentist.   Call the daughter again tomorrow. CODE STATUS: Full code TOTAL TIME TAKING CARE OF THIS PATIENT: 38 minutes.   POSSIBLE D/C IN 2-3 DAYS, DEPENDING ON CLINICAL CONDITION.   Epifanio Lesches M.D on 11/23/2018 at 11:00 AM  Between 7am to 6pm - Pager - [phone]  After 6pm go to [email or web address] - password [email or web address] [facility]  Office  [phone]  CC: Primary care physician; Adin Hector, MD   Note: This dictation was prepared with Dragon dictation along with smaller phrase technology. Any transcriptional errors that result from this process are unintentional.

## 2018-11-23 NOTE — Progress Notes (Addendum)
Pharmacy Antibiotic Note  MERWYN HODAPP is a 85 y.o. male admitted on 11/21/2018 with UTI.  Pharmacy has been consulted for Meroepenem dosing. Ucx= Pseudomonas+, f/u sensitivities  high-grade bladder cancer and incompletely resected tumor, history of bilateral hydronephrosis, history of prostate cancer, recurrent UTIs, chronic bilateral hydro-nephrosis, atrial fibrillation, pacemaker/defibrillato  Noted that Ucx from 3/26 growing >100K P. Aeruginosa had intermediate susc to meropenem.  D/w Dr. Vianne Bulls and change to ceftazidime pending susc. There are not GNR in Bcx  Plan:  Ceftazidime 2gm IV x 1 tonight then 500mg  IV q24h    Height: 6\' 2"  (188 cm) Weight: 218 lb 11.1 oz (99.2 kg) IBW/kg (Calculated) : 82.2  Temp (24hrs), Avg:98.4 F (36.9 C), Min:97.8 F (36.6 C), Max:99 F (37.2 C)  Recent Labs  Lab 11/21/18 1948 11/22/18 0334 11/23/18 0345  WBC 11.0* 10.7*  --   CREATININE 5.54* 5.69* 5.20*    Estimated Creatinine Clearance: 10.9 mL/min (A) (by C-G formula based on SCr of 5.2 mg/dL (H)).    Allergies  Allergen Reactions  . Diovan [Valsartan] Cough  . Gabapentin Other (See Comments)    Sedation at all doses  . Hydralazine Itching  . Lisinopril Cough  . Pregabalin Other (See Comments)    Sedation at all doses  . Levofloxacin Other (See Comments)    Too many side effects. Nausea, vomiting, upset stomach, increased confusion, etc Other reaction(s): Confusion Nausea, chills Nausea, chills   . Sulfamethoxazole-Trimethoprim Other (See Comments)    Too many side effects. Nausea, vomiting, upset stomach, increased confusion, etc    Antimicrobials this admission: CTX 5/13> 5/14 Meropenem 5/14 >> 5/14 Ceftazidime 5/14 >>  Dose adjustments this admission:    Microbiology results:   BCx:   5/12 UCx: Pseudomonas aeruginosa    Sputum:      MRSA PCR: 5/12 Covid negative    Thank you for allowing pharmacy to be a part of this patient's care.  Clovis Riley 11/23/2018 4:35 PM

## 2018-11-23 NOTE — Progress Notes (Signed)
[facility], [place] 11/23/18  Subjective:   LOS: 2 05/13 0701 - 05/14 0700 In: 840 [P.O.:840] Out: 4481 [Urine:1850] Patient is doing fair today Urine output 1850 cc Serum creatinine improved slightly to 4.2 today Patient states that he ate oatmeal for breakfast Denies any acute complaints or shortness of breath   Objective:  Vital signs in last 24 hours:  Temp:  [98.4 F (36.9 C)-99 F (37.2 C)] 98.4 F (36.9 C) (05/14 0456) Pulse Rate:  [72-85] 73 (05/14 0833) Resp:  [16-18] 18 (05/14 0456) BP: (118-144)/(78-99) 144/99 (05/14 0833) SpO2:  [99 %-100 %] 100 % (05/14 0456)  Weight change:  Filed Weights   11/21/18 1828 11/21/18 2334  Weight: 82.6 kg 99.2 kg    Intake/Output:    Intake/Output Summary (Last 24 hours) at 11/23/2018 0947 Last data filed at 11/23/2018 8563 Gross per 24 hour  Intake 840 ml  Output 1850 ml  Net -1010 ml    General:  Frail, elderly gentleman, laying in the bed  HEENT  anicteric, moist oral mucous membranes  Neck:  Supple, no JVD  Lungs:  Normal breathing effort, clear to auscultation  Heart::  Irregular rhythm, prominent systolic murmur  Abdomen:  Soft, nontender  Extremities:  + Peripheral edema  Neurologic:  Lethargic, able to answer few questions  Skin:  No acute rashes  Access:   Foley:  Catheter present with clear yellow urine    Basic Metabolic Panel:  Recent Labs  Lab 11/21/18 1948 11/22/18 0334 11/23/18 0345  NA 131* 135 141  K 4.6 4.2 4.1  CL 99 107 109  CO2 20* 19* 20*  GLUCOSE 242* 152* 128*  BUN 78* 76* 74*  CREATININE 5.54* 5.69* 5.20*  CALCIUM 8.1* 8.2* 7.9*  PHOS  --   --  5.1*     CBC: Recent Labs  Lab 11/21/18 1948 11/22/18 0334 11/23/18 0345  WBC 11.0* 10.7*  --   NEUTROABS 9.4*  --   --   HGB 10.1* 8.4* 8.0*  HCT 30.9* 26.3* 25.5*  MCV 84.9 86.8  --   PLT 136* 120*  --      No results found for: HEPBSAG, HEPBSAB, HEPBIGM    Microbiology:  Recent  Results (from the past 240 hour(s))  CULTURE, URINE COMPREHENSIVE     Status: Abnormal (Preliminary result)   Collection Time: 11/21/18 12:57 PM  Result Value Ref Range Status   Urine Culture, Comprehensive Preliminary report (A)  Preliminary   Organism ID, Bacteria Gram negative rods (A)  Preliminary    Comment: Greater than 100,000 colony forming units per mL  Microscopic Examination     Status: Abnormal   Collection Time: 11/21/18 12:58 PM  Result Value Ref Range Status   WBC, UA >30 (A) 0 - 5 /hpf Final   RBC 3-10 (A) 0 - 2 /hpf Final   Epithelial Cells (non renal) None seen 0 - 10 /hpf Final   Bacteria, UA Moderate (A) None seen/Few Final  Urine culture     Status: Abnormal (Preliminary result)   Collection Time: 11/21/18  7:48 PM  Result Value Ref Range Status   Specimen Description   Final    URINE, RANDOM Performed at [facility], [address] [place], [place] [address]    Special Requests   Final    NONE Performed at [facility], [address], [place], [place] [address]    Culture (A)  Final    80,000 COLONIES/mL GRAM NEGATIVE  RODS IDENTIFICATION AND SUSCEPTIBILITIES TO FOLLOW Performed at [facility], [place] [address] [place], [place] [address]    Report Status PENDING  Incomplete  SARS Coronavirus 2 [facility] order, Performed in [place] hospital lab)     Status: None   Collection Time: 11/21/18  9:43 PM  Result Value Ref Range Status   SARS Coronavirus 2 NEGATIVE NEGATIVE Final    Comment: (NOTE) If result is NEGATIVE SARS-CoV-2 target nucleic acids are NOT DETECTED. The SARS-CoV-2 RNA is generally detectable in upper and lower  respiratory specimens during the acute phase of infection. The lowest  concentration of SARS-CoV-2 viral copies this assay can detect is 250  copies / mL. A negative result does not preclude SARS-CoV-2 infection  and should not be used as the sole basis for treatment or other  patient management  decisions.  A negative result may occur with  improper specimen collection / handling, submission of specimen other  than nasopharyngeal swab, presence of viral mutation(s) within the  areas targeted by this assay, and inadequate number of viral copies  (<250 copies / mL). A negative result must be combined with clinical  observations, patient history, and epidemiological information. If result is POSITIVE SARS-CoV-2 target nucleic acids are DETECTED. The SARS-CoV-2 RNA is generally detectable in upper and lower  respiratory specimens dur ing the acute phase of infection.  Positive  results are indicative of active infection with SARS-CoV-2.  Clinical  correlation with patient history and other diagnostic information is  necessary to determine patient infection status.  Positive results do  not rule out bacterial infection or co-infection with other viruses. If result is PRESUMPTIVE POSTIVE SARS-CoV-2 nucleic acids MAY BE PRESENT.   A presumptive positive result was obtained on the submitted specimen  and confirmed on repeat testing.  While 2019 novel coronavirus  (SARS-CoV-2) nucleic acids may be present in the submitted sample  additional confirmatory testing may be necessary for epidemiological  and / or clinical management purposes  to differentiate between  SARS-CoV-2 and other Sarbecovirus currently known to infect humans.  If clinically indicated additional testing with an alternate test  methodology [phone]) is advised. The SARS-CoV-2 RNA is generally  detectable in upper and lower respiratory sp ecimens during the acute  phase of infection. The expected result is Negative. Fact Sheet for Patients:  StrictlyIdeas.no Fact Sheet for Healthcare Providers: BankingDealers.co.za This test is not yet approved or cleared by the [place] FDA and has been authorized for detection and/or diagnosis of SARS-CoV-2 by FDA under an  Emergency Use Authorization (EUA).  This EUA will remain in effect (meaning this test can be used) for the duration of the COVID-19 declaration under Section 564(b)(1) of the Act, 21 U.S.C. section 360bbb-3(b)(1), unless the authorization is terminated or revoked sooner. Performed at [facility], [address] [place], [place] [address]     Coagulation Studies: No results for input(s): LABPROT, INR in the last 72 hours.  Urinalysis: Recent Labs    11/21/18 1258 11/21/18 1948  COLORURINE  --  YELLOW*  LABSPEC  --  1.008  PHURINE  --  6.0  GLUCOSEU Negative 50*  HGBUR  --  LARGE*  BILIRUBINUR Negative NEGATIVE  KETONESUR  --  NEGATIVE  PROTEINUR 2+* 30*  NITRITE Negative NEGATIVE  LEUKOCYTESUR 3+* LARGE*      Imaging: No results found.   Medications:   . sodium chloride 50 mL/hr at 11/22/18 1359  . cefTRIAXone (ROCEPHIN)  IV 1 g (11/23/18  0315)   . amLODipine  2.5 mg Oral Daily  . vitamin C  250 mg Oral Daily   And  . ferrous sulfate  325 mg Oral Q breakfast  . finasteride  5 mg Oral Daily  . insulin aspart  0-5 Units Subcutaneous QHS  . insulin aspart  0-9 Units Subcutaneous TID WC  . metoprolol tartrate  25 mg Oral BID  . multivitamins with iron  1 tablet Oral Daily  . pantoprazole  40 mg Oral Daily  . rosuvastatin  5 mg Oral QHS  . sodium bicarbonate  650 mg Oral BID  . tamsulosin  0.4 mg Oral Daily   acetaminophen **OR** acetaminophen, ondansetron **OR** ondansetron (ZOFRAN) IV, polyethylene glycol, traZODone  Assessment/ Plan:  85 y.o. male with medical problems of high-grade bladder cancer and incompletely resected tumor, history of bilateral hydronephrosis, history of prostate cancer, recurrent UTIs, chronic bilateral hydro-nephrosis, atrial fibrillation, pacemaker/defibrillator in place who was admitted to [facility] on 11/21/2018 for evaluation of Worsening renal function.    Active Problems:   Acute renal failure (ARF) ([place])   #.  Acute  renal failure on CKD st 4.  Baseline creatinine 3.86/GFR 15 from November 09, 2018 Differential for acute renal failure includes bladder outlet obstruction from Foley malfunction, versus urinary tract infection Foley has been replaced.  Serum creatinine has started to improve Urine output is satisfactory No acute indication for dialysis at present however discussed with patient's daughter that there is a possibility of underlying uremia contributing to his altered mental status.  If mental status and general condition does not improve, can consider hemodialysis although it will be difficult for him at his age and with his comorbidities Plan is to wait and see if renal function improves with new Foley catheter that was placed this admission   Recent Labs    11/21/18 1948 11/22/18 0334 11/23/18 0345  CREATININE 5.54* 5.69* 5.20*    #. Anemia of CKD  Lab Results  Component Value Date   HGB 8.0 (L) 11/23/2018  Continue to monitor Not a candidate for Epogen due to underlying malignancy Can consider blood transfusion symptomatic anemia  #. SHPTH  No results found for: PTH Lab Results  Component Value Date   PHOS 5.1 (H) 11/23/2018  Will continue to monitor Obtain PTH in the morning    #. Diabetes type 2 with CKD Hemoglobin A1C (%)  Date Value  12/03/2012 7.1 (H)   Hgb A1c MFr Bld (%)  Date Value  11/22/2018 7.0 (H)   #Urinary tract infection/sepsis Currently being treated with IV Rocephin Urine culture growing Pseudomonas But cultures growing gram-negative rods    LOS: [place] 5/14/20209:47 AM  [place], [place]

## 2018-11-23 NOTE — Care Management (Signed)
RNCm assessment complete.  Full note to follow

## 2018-11-23 NOTE — Progress Notes (Signed)
Pharmacy Antibiotic Note  Blake [name] is a 85 y.o. male admitted on 11/21/2018 with UTI.  Pharmacy has been consulted for Meroepenem dosing. Ucx= Pseudomonas+, f/u sensitivities  high-grade bladder cancer and incompletely resected tumor, history of bilateral hydronephrosis, history of prostate cancer, recurrent UTIs, chronic bilateral hydro-nephrosis, atrial fibrillation, pacemaker/defibrillato  Plan: Will order Meropenem 500mg  IV q12h for Crcl 10.9 ml/min (Scr 5.2).  Noted that new foley was placed this admssion.    Height: 6\' 2"  (188 cm) Weight: 218 lb 11.1 oz (99.2 kg) IBW/kg (Calculated) : 82.2  Temp (24hrs), Avg:98.8 F (37.1 C), Min:98.4 F (36.9 C), Max:99 F (37.2 C)  Recent Labs  Lab 11/21/18 1948 11/22/18 0334 11/23/18 0345  WBC 11.0* 10.7*  --   CREATININE 5.54* 5.69* 5.20*    Estimated Creatinine Clearance: 10.9 mL/min (A) (by C-G formula based on SCr of 5.2 mg/dL (H)).    Allergies  Allergen Reactions  . Diovan [Valsartan] Cough  . Gabapentin Other (See Comments)    Sedation at all doses  . Hydralazine Itching  . Lisinopril Cough  . Pregabalin Other (See Comments)    Sedation at all doses  . Levofloxacin Other (See Comments)    Too many side effects. Nausea, vomiting, upset stomach, increased confusion, etc Other reaction(s): Confusion Nausea, chills Nausea, chills   . Sulfamethoxazole-Trimethoprim Other (See Comments)    Too many side effects. Nausea, vomiting, upset stomach, increased confusion, etc    Antimicrobials this admission: CTX 5/13> 5/14 Meropenem 5/14 >>  Dose adjustments this admission:    Microbiology results:   BCx:   5/12 UCx: Pseudomonas aeruginosa    Sputum:      MRSA PCR: 5/12 Covid negative    Thank you for allowing pharmacy to be a part of this patient's care.  Blake [name] A 11/23/2018 12:09 PM

## 2018-11-24 LAB — PROTEIN ELECTROPHORESIS, SERUM
A/G Ratio: 0.6 — ABNORMAL LOW (ref 0.7–1.7)
Albumin ELP: 2.4 g/dL — ABNORMAL LOW (ref 2.9–4.4)
Alpha-1-Globulin: 0.4 g/dL (ref 0.0–0.4)
Alpha-2-Globulin: 1.1 g/dL — ABNORMAL HIGH (ref 0.4–1.0)
Beta Globulin: 0.9 g/dL (ref 0.7–1.3)
Gamma Globulin: 1.4 g/dL (ref 0.4–1.8)
Globulin, Total: 3.8 g/dL (ref 2.2–3.9)
Total Protein ELP: 6.2 g/dL (ref 6.0–8.5)

## 2018-11-24 LAB — RENAL FUNCTION PANEL
Albumin: 2.4 g/dL — ABNORMAL LOW (ref 3.5–5.0)
Anion gap: 11 (ref 5–15)
BUN: 69 mg/dL — ABNORMAL HIGH (ref 8–23)
CO2: 20 mmol/L — ABNORMAL LOW (ref 22–32)
Calcium: 7.9 mg/dL — ABNORMAL LOW (ref 8.9–10.3)
Chloride: 111 mmol/L (ref 98–111)
Creatinine, Ser: 4.89 mg/dL — ABNORMAL HIGH (ref 0.61–1.24)
GFR calc Af Amer: 11 mL/min — ABNORMAL LOW (ref >60)
GFR calc non Af Amer: 9 mL/min — ABNORMAL LOW (ref >60)
Glucose, Bld: 115 mg/dL — ABNORMAL HIGH (ref 70–99)
Phosphorus: 4.7 mg/dL — ABNORMAL HIGH (ref 2.5–4.6)
Potassium: 4.1 mmol/L (ref 3.5–5.1)
Sodium: 142 mmol/L (ref 135–145)

## 2018-11-24 LAB — CULTURE, URINE COMPREHENSIVE

## 2018-11-24 LAB — GLUCOSE, CAPILLARY
Glucose-Capillary: 116 mg/dL — ABNORMAL HIGH (ref 70–99)
Glucose-Capillary: 174 mg/dL — ABNORMAL HIGH (ref 70–99)
Glucose-Capillary: 87 mg/dL (ref 70–99)
Glucose-Capillary: 94 mg/dL (ref 70–99)

## 2018-11-24 LAB — URINE CULTURE: Culture: 80000 — AB

## 2018-11-24 LAB — HEMOGLOBIN AND HEMATOCRIT, BLOOD
HCT: 25 % — ABNORMAL LOW (ref 39.0–52.0)
Hemoglobin: 7.8 g/dL — ABNORMAL LOW (ref 13.0–17.0)

## 2018-11-24 MED ORDER — GUAIFENESIN 100 MG/5ML PO SOLN
5.0000 mL | ORAL | Status: DC | PRN
  Administered 2018-11-29: 100 mg via ORAL
  Filled 2018-11-24 (×3): qty 5

## 2018-11-24 MED ORDER — SODIUM CHLORIDE 0.9 % IV SOLN
1.0000 g | INTRAVENOUS | Status: DC
  Administered 2018-11-24 – 2018-11-29 (×6): 1 g via INTRAVENOUS
  Filled 2018-11-24 (×10): qty 1

## 2018-11-24 NOTE — Progress Notes (Signed)
Pharmacy Antibiotic Note  Blake [name] is a 85 y.o. male admitted on 11/21/2018 with UTI.  Pharmacy has been consulted for Meroepenem dosing. Ucx= Pseudomonas+, f/u sensitivities  high-grade bladder cancer and incompletely resected tumor, history of bilateral hydronephrosis, history of prostate cancer, recurrent UTIs, chronic bilateral hydro-nephrosis, atrial fibrillation, pacemaker/defibrillato  Noted that Ucx from 3/26 growing >100K P. Aeruginosa had intermediate susc to meropenem.  D/w Dr. Vianne Bulls and change to ceftazidime pending susc. There are not GNR in Bcx  Plan:  Ceftazidime 2gm IV x 1 tonight then 500mg  IV q24h   5/15: Scr improved (Crcl now 11.6). Will adjust Ceftazidime to 1 gram IV q24h.   Sensitivities= Intermediate to cipro, sensitive to ceftazidime   Height: 6\' 2"  (188 cm) Weight: 218 lb 11.1 oz (99.2 kg) IBW/kg (Calculated) : 82.2  Temp (24hrs), Avg:98.2 F (36.8 C), Min:97.8 F (36.6 C), Max:98.8 F (37.1 C)  Recent Labs  Lab 11/21/18 1948 11/22/18 0334 11/23/18 0345 11/24/18 0419  WBC 11.0* 10.7*  --   --   CREATININE 5.54* 5.69* 5.20* 4.89*    Estimated Creatinine Clearance: 11.6 mL/min (A) (by C-G formula based on SCr of 4.89 mg/dL (H)).    Allergies  Allergen Reactions  . Diovan [Valsartan] Cough  . Gabapentin Other (See Comments)    Sedation at all doses  . Hydralazine Itching  . Lisinopril Cough  . Pregabalin Other (See Comments)    Sedation at all doses  . Levofloxacin Other (See Comments)    Too many side effects. Nausea, vomiting, upset stomach, increased confusion, etc Other reaction(s): Confusion Nausea, chills Nausea, chills   . Sulfamethoxazole-Trimethoprim Other (See Comments)    Too many side effects. Nausea, vomiting, upset stomach, increased confusion, etc    Antimicrobials this admission: CTX 5/13> 5/14 Meropenem 5/14 >> 5/14 Ceftazidime 5/14 >>  Dose adjustments this admission:    Microbiology results:   BCx:    5/12 UCx: Pseudomonas aeruginosa    Sputum:      MRSA PCR: 5/12 Covid negative    Thank you for allowing pharmacy to be a part of this patient's care.  Riot Waterworth A 11/24/2018 11:39 AM

## 2018-11-24 NOTE — Care Management Important Message (Signed)
Important Message  Patient Details  Name: Blake [name] MRN: [id number] Date of Birth: 1923-04-15   Medicare Important Message Given:  Yes    [name] 11/24/2018, 12:02 PM

## 2018-11-24 NOTE — Progress Notes (Signed)
Daughter called asking for an update. Daughter was requesting the physician contact her ASAP.   Fuller Mandril, RN

## 2018-11-24 NOTE — Progress Notes (Signed)
[place] [facility] [place] NAME: Blake [name]    MR#:  [id number]  DATE OF BIRTH:  1922-08-06  Admitted  for acute renal failure on chronic renal failure, metabolic encephalopathy with confusion, now on IV fluids, kidney function improving, he is alert, awake, oriented.  CHIEF COMPLAINT:   Chief Complaint  Patient presents with  . Abnormal Lab  Alert, awake, oriented, denies any complaints.  Eating better today.  REVIEW OF SYSTEMS:   Review of Systems  Constitutional: Negative for chills and fever.  HENT: Negative for hearing loss.   Eyes: Negative for blurred vision, double vision and photophobia.  Respiratory: Negative for cough, hemoptysis and shortness of breath.   Cardiovascular: Negative for palpitations, orthopnea and leg swelling.  Gastrointestinal: Negative for abdominal pain, diarrhea and vomiting.  Genitourinary: Negative for dysuria and urgency.  Musculoskeletal: Negative for myalgias and neck pain.  Skin: Negative for rash.  Neurological: Negative for dizziness, focal weakness, seizures, weakness and headaches.  Psychiatric/Behavioral: Negative for memory loss. The patient does not have insomnia.     DRUG ALLERGIES:   Allergies  Allergen Reactions  . Diovan [Valsartan] Cough  . Gabapentin Other (See Comments)    Sedation at all doses  . Hydralazine Itching  . Lisinopril Cough  . Pregabalin Other (See Comments)    Sedation at all doses  . Levofloxacin Other (See Comments)    Too many side effects. Nausea, vomiting, upset stomach, increased confusion, etc Other reaction(s): Confusion Nausea, chills Nausea, chills   . Sulfamethoxazole-Trimethoprim Other (See Comments)    Too many side effects. Nausea, vomiting, upset stomach, increased confusion, etc    VITALS:  Blood pressure (!) 143/107, pulse 84, temperature 98.8 F (37.1 C), temperature source Oral, resp. rate 18, height 6\' 2"  (1.88 m), weight 99.2 kg, SpO2  99 %.  PHYSICAL EXAMINATION:  GENERAL:  85 y.o.-year-old patient lying in the bed with no acute distress.  EYES: Pupils equal, round, reactive to lightNo scleral icterus. Extraocular muscles intact.  HEENT: Head atraumatic, normocephalic. Oropharynx and nasopharynx clear.  NECK:  Supple, no jugular venous distention. No thyroid enlargement, no tenderness.  LUNGS: Normal breath sounds bilaterally, no wheezing, rales,rhonchi or crepitation. No use of accessory muscles of respiration.  CARDIOVASCULAR: S1, S2 normal. No murmurs, rubs, or gallops.  ABDOMEN: Soft, nontender, nondistended. Bowel sounds present. No organomegaly or mass.  EXTREMITIES: No pedal edema, cyanosis, or clubbing.  NEUROLOGIC:, Awake, Oriented.  PSYCHIATRIC alert, awake, oriented.Marland Kitchen  SKIN: No obvious rash, lesion, or ulcer.    LABORATORY PANEL:   CBC Recent Labs  Lab 11/22/18 0334  11/24/18 0419  WBC 10.7*  --   --   HGB 8.4*   < > 7.8*  HCT 26.3*   < > 25.0*  PLT 120*  --   --    < > = values in this interval not displayed.   ------------------------------------------------------------------------------------------------------------------  Chemistries  Recent Labs  Lab 11/21/18 1948  11/24/18 0419  NA 131*   < > 142  K 4.6   < > 4.1  CL 99   < > 111  CO2 20*   < > 20*  GLUCOSE 242*   < > 115*  BUN 78*   < > 69*  CREATININE 5.54*   < > 4.89*  CALCIUM 8.1*   < > 7.9*  AST 34  --   --   ALT 32  --   --   ALKPHOS 82  --   --  BILITOT 0.4  --   --    < > = values in this interval not displayed.   ------------------------------------------------------------------------------------------------------------------  Cardiac Enzymes No results for input(s): TROPONINI in the last 168 hours. ------------------------------------------------------------------------------------------------------------------  RADIOLOGY:  No results found.  EKG:   Orders placed or performed during the hospital encounter of  11/21/18  . EKG 12-Lead  . EKG 12-Lead    ASSESSMENT AND PLAN:   Acute on chronic renal failure, chronic kidney disease stage IV: Chronic bilateral hydronephrosis, patient had a Foley catheter was placed in April,, Foley catheter was taken out in Dr. Bernardo Heater office few days ago because of concern for infection, pus came out of catheter, catheter was removed, patient noted to have worsening renal function so admitted to hospital, now on IV fluids, IV antibiotics, renal function is improving, Foley catheter also replaced in the emergency room.   #2.  Sepsis with Pseudomonas ; on Fortaz.,  Changed antibiotics yesterday. #3. metabolic encephalopathy likely due to UTI, sepsis/f renal failure,.,  improving.  Continue Flomax.,  Awake, oriented now.   continue all the records are reviewed and case discussed with Care Management/Social Workerr. Management plans discussed with the patient, family and they are in agreement. Than 50% time spent in counseling, coordination of care.  Dr. Candiss Norse spoke with patient's daughter who is the dentist.  Call the daughter again tomorrow. CODE STATUS: Full code TOTAL TIME TAKING CARE OF THIS PATIENT: 38 minutes.   POSSIBLE D/C IN 2-3 DAYS, DEPENDING ON CLINICAL CONDITION.   Epifanio Lesches M.D on 11/24/2018 at 11:35 AM  Between 7am to 6pm - Pager - [phone]  After 6pm go to [email or web address] - password [email or web address] [facility]  Office  [phone]  CC: Primary care physician; Adin Hector, MD   Note: This dictation was prepared with Dragon dictation along with smaller phrase technology. Any transcriptional errors that result from this process are unintentional.

## 2018-11-24 NOTE — TOC Initial Note (Signed)
Transition of Care [facility]) - Initial/Assessment Note    Patient Details  Name: Blake [name] MRN: [id number] Date of Birth: October 19, 1922  Transition of Care [facility]) CM/SW Contact:    Beverly Sessions, RN Phone Number: 11/24/2018, 4:08 PM  Clinical Narrative:                 Patient admitted from home with acute on chronic renal failure.  Patient lives in his daughter's home, along with his wife.  RNCM spoke with daughter Mrs. Primus Bravo by Phone.    PCP Caryl Comes. [facility].  Daughter states she transports patient to appointments.  Daughter states that they are able to obtain all of the mediations, however she typpically has to pay the copay for him.  Daughter inquires about switching any of the medications to cheaper medications.  RNCM encouraged daughter to discuss with Dr Caryl Comes to see if the would like to make any changes.   Patient is open with [place].  Corene Cornea with [facility] is aware of admission.  Daughter wishes for resumption of services at discharge.  Per nephrology he does not feel that the foley catheter has been maintained properly at home.  RNCM notified bedside RN to provide foley cath education prior to discharge.  Corene Cornea with [facility] also notified.   Per daughter at baseline patient ambulates with RW.  No further anticipate DME needs expected.   Expected Discharge Plan: [place] Barriers to Discharge: Continued Medical Work up   Patient Goals and CMS Choice Patient states their goals for this hospitalization and ongoing recovery are:: Return home with [facility]       Expected Discharge Plan and Services Expected Discharge Plan: [place]   Discharge Planning Services: CM Consult   Living arrangements for the past 2 months: Single Family Home                           HH Arranged: RN, PT, Social Work [employer] Agency: [place] ([place]) Date [place]: 11/22/18 Time [place]:  1223 Representative spoke with at [place] Arrangements/Services Living arrangements for the past 2 months: [place] with:: Adult Children Patient language and need for interpreter reviewed:: Yes Do you feel safe going back to the place where you live?: Yes      Need for Family Participation in Patient Care: Yes (Comment) Care giver support system in place?: Yes (comment)   Criminal Activity/Legal Involvement Pertinent to Current Situation/Hospitalization: No - Comment as needed  Activities of Daily Living Home Assistive Devices/Equipment: Dentures (specify type), Grab bars in shower, Shower chair with back, Wheelchair ADL Screening (condition at time of admission) Patient's cognitive ability adequate to safely complete daily activities?: No Is the patient deaf or have difficulty hearing?: No Does the patient have difficulty seeing, even when wearing glasses/contacts?: No Does the patient have difficulty concentrating, remembering, or making decisions?: Yes Patient able to express need for assistance with ADLs?: Yes Does the patient have difficulty dressing or bathing?: Yes Independently performs ADLs?: No Communication: Needs assistance Is this a change from baseline?: Pre-admission baseline Dressing (OT): Dependent Is this a change from baseline?: Pre-admission baseline Grooming: Dependent Is this a change from baseline?: Pre-admission baseline Feeding: Needs assistance Is this a change from baseline?: Pre-admission baseline Bathing: Dependent Is this a change from baseline?: Pre-admission baseline Toileting: Dependent Is this a change  from baseline?: Pre-admission baseline In/Out Bed: Dependent Is this a change from baseline?: Pre-admission baseline Walks in Home: Dependent Is this a change from baseline?: Pre-admission baseline Does the patient have difficulty walking or climbing stairs?: Yes Weakness of Legs: Both Weakness of  Arms/Hands: Both  Permission Sought/Granted                  Emotional Assessment Appearance:: Appears stated age       Alcohol / Substance Use: Not Applicable Psych Involvement: No (comment)  Admission diagnosis:  AKI (acute kidney injury) ([place]) [N17.9] Patient Active Problem List   Diagnosis Date Noted  . Acute renal failure (ARF) ([place]) 11/21/2018  . AKI (acute kidney injury) ([place])   . Goals of care, counseling/discussion   . Palliative care by specialist   . DNR (do not resuscitate) discussion   . Seizure-like activity ([place]) 11/05/2018  . Allergic rhinitis 07/24/2018  . Cardiac pacemaker in situ 07/24/2018  . History of sleep apnea 07/24/2018  . Macular degeneration 07/24/2018  . Trigeminal neuralgia pain 01/11/2018  . TMJ pain dysfunction syndrome 01/11/2018  . Recurrent UTI 09/26/2017  . Urothelial carcinoma of bladder ([place]) 09/26/2017  . Pressure injury of skin 09/12/2017  . SIRS (systemic inflammatory response syndrome) ([place]) 09/10/2017  . Chronic cystitis 08/25/2017  . Sepsis ([place]) 06/10/2017  . CAP (community acquired pneumonia) 06/10/2017  . Infection due to enterococcus 06/07/2017  . UTI (urinary tract infection) 05/17/2017  . Blood in stool   . Hemorrhage of rectum and anus   . Lower GI bleed 10/20/2016  . Near syncope   . Acute renal failure superimposed on chronic kidney disease ([place])   . Pneumonia 10/09/2016  . Aspiration pneumonia ([place]) 10/09/2016  . Acute respiratory failure ([place]) 10/09/2016  . Hydronephrosis, bilateral 07/19/2016  . Spinal stenosis of lumbar region with neurogenic claudication 07/19/2016  . AAA (abdominal aortic aneurysm) without rupture ([place]) 04/27/2016  . Arthritis, degenerative 03/31/2016  . A-fib ([place]) 03/31/2016  . HLD (hyperlipidemia) 03/31/2016  . CA of prostate ([place]) 03/31/2016  . Central perforation of tympanic membrane of right ear 03/26/2016  . 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 Presbycusis of left ear with restricted hearing of right ear 03/01/2016  . Chronic kidney disease, stage 3 (moderate) ([place]) 10/30/2015  . Episode of unresponsiveness 10/30/2015  . Sick sinus syndrome ([place]) 10/30/2015  . Malignant neoplasm of overlapping sites of bladder ([place]) 09/26/2015  . Urinary retention 09/17/2015  . Bladder neoplasm of uncertain malignant potential 08/13/2015  . Thrombocytopenia, unspecified ([place]) 07/24/2015  . Fitting or adjustment of cardiac pacemaker 05/09/2015  . SVT (supraventricular tachycardia) ([place]) 04/09/2015  . Diabetes mellitus, type 2 ([place]) 04/09/2015  . Benign hypertension 04/09/2015  . Acute myocardial infarction, initial episode of care ([place]) 04/09/2015  . Phimosis 01/30/2014  . Bladder outlet obstruction 04/18/2013  . Dysuria 04/18/2013  . Gross hematuria 04/18/2013  . Urethral valve, acquired 04/18/2013  . Incomplete emptying of bladder 11/26/2012  . Personal history of prostate cancer 11/26/2012   PCP:  Adin Hector, MD Pharmacy:   [facility], [place] [address] Phone: [phone] Fax: [place], [place] [address] [place] [address] Phone: [phone] Fax: [place], [place] [address]  Phone: [phone] Fax: [phone]  [id number] - [place], [place] [address] Phone: [phone] Fax: [phone]     Social Determinants of Health (SDOH) Interventions    Readmission Risk Interventions Readmission Risk Prevention Plan 11/24/2018 11/09/2018 11/06/2018  Transportation Screening Complete Complete Complete  PCP or Specialist Appt within 3-5 Days - Complete -  HRI or Home Care Consult Complete Complete  -  Palliative Care Screening Not Applicable - -  Medication Review (RN Care Manager) Complete Complete -  Some recent data might be hidden

## 2018-11-24 NOTE — Progress Notes (Signed)
[facility], [place] 11/24/18  Subjective:   LOS: 3 05/14 0701 - 05/15 0700 In: 2461 [P.O.:1080; I.V.:1181; IV Piggyback:200] Out: -  Patient is doing fair today Urine output not recorded but 3000 cc for 24 hr CrCl  Serum creatinine improved 5.2 to 4.9 Patient is more interactive today.  States that he ate oatmeal for breakfast Denies any acute complaints or shortness of breath   Objective:  Vital signs in last 24 hours:  Temp:  [97.8 F (36.6 C)-98.8 F (37.1 C)] 98.8 F (37.1 C) (05/15 0356) Pulse Rate:  [82-88] 84 (05/15 0903) Resp:  [18-20] 18 (05/15 0356) BP: (131-143)/(80-107) 143/107 (05/15 0903) SpO2:  [98 %-100 %] 99 % (05/15 0903)  Weight change:  Filed Weights   11/21/18 1828 11/21/18 2334  Weight: 82.6 kg 99.2 kg    Intake/Output:    Intake/Output Summary (Last 24 hours) at 11/24/2018 4158 Last data filed at 11/24/2018 3094 Gross per 24 hour  Intake 2966.73 ml  Output 500 ml  Net 2466.73 ml    General:  Frail, elderly gentleman, laying in the bed  HEENT  anicteric, moist oral mucous membranes  Neck:  Supple, no JVD  Lungs:  Normal breathing effort, clear to auscultation  Heart::  Irregular rhythm, prominent systolic murmur  Abdomen:  Soft, nontender  Extremities:  + Peripheral edema  Neurologic:   Alert, able to answer few questions appropriately  Skin:  No acute rashes  Foley:  Catheter present with clear yellow urine    Basic Metabolic Panel:  Recent Labs  Lab 11/21/18 1948 11/22/18 0334 11/23/18 0345 11/24/18 0419  NA 131* 135 141 142  K 4.6 4.2 4.1 4.1  CL 99 107 109 111  CO2 20* 19* 20* 20*  GLUCOSE 242* 152* 128* 115*  BUN 78* 76* 74* 69*  CREATININE 5.54* 5.69* 5.20* 4.89*  CALCIUM 8.1* 8.2* 7.9* 7.9*  PHOS  --   --  5.1* 4.7*     CBC: Recent Labs  Lab 11/21/18 1948 11/22/18 0334 11/23/18 0345 11/24/18 0419  WBC 11.0* 10.7*  --   --   NEUTROABS 9.4*  --   --   --   HGB 10.1* 8.4* 8.0*  7.8*  HCT 30.9* 26.3* 25.5* 25.0*  MCV 84.9 86.8  --   --   PLT 136* 120*  --   --      No results found for: HEPBSAG, HEPBSAB, HEPBIGM    Microbiology:  Recent Results (from the past 240 hour(s))  CULTURE, URINE COMPREHENSIVE     Status: Abnormal   Collection Time: 11/21/18 12:57 PM  Result Value Ref Range Status   Urine Culture, Comprehensive Final report (A)  Final   Organism ID, Bacteria Comment (A)  Final    Comment: Pseudomonas aeruginosa Greater than 100,000 colony forming units per mL    ANTIMICROBIAL SUSCEPTIBILITY Comment  Final    Comment:       ** S = Susceptible; I = Intermediate; R = Resistant **                    P = Positive; N = Negative             MICS are expressed in micrograms per mL    Antibiotic                 RSLT#1    RSLT#2    RSLT#3    RSLT#4 Amikacin  S Cefepime                       S Ceftazidime                    S Ciprofloxacin                  I Gentamicin                     S Imipenem                       S Levofloxacin                   I Meropenem                      S Piperacillin                   S Ticarcillin                    S Tobramycin                     S   Microscopic Examination     Status: Abnormal   Collection Time: 11/21/18 12:58 PM  Result Value Ref Range Status   WBC, UA >30 (A) 0 - 5 /hpf Final   RBC 3-10 (A) 0 - 2 /hpf Final   Epithelial Cells (non renal) None seen 0 - 10 /hpf Final   Bacteria, UA Moderate (A) None seen/Few Final  Urine culture     Status: Abnormal   Collection Time: 11/21/18  7:48 PM  Result Value Ref Range Status   Specimen Description   Final    URINE, RANDOM Performed at [facility], [address], [place], [place] [address]    Special Requests   Final    NONE Performed at [facility], [place], [place] [address]    Culture 80,000 COLONIES/mL PSEUDOMONAS AERUGINOSA (A)  Final   Report Status 11/24/2018 FINAL  Final    Organism ID, Bacteria PSEUDOMONAS AERUGINOSA (A)  Final      Susceptibility   Pseudomonas aeruginosa - MIC*    CEFTAZIDIME 4 SENSITIVE Sensitive     CIPROFLOXACIN 2 INTERMEDIATE Intermediate     GENTAMICIN <=1 SENSITIVE Sensitive     IMIPENEM 2 SENSITIVE Sensitive     PIP/TAZO 8 SENSITIVE Sensitive     CEFEPIME 4 SENSITIVE Sensitive     * 80,000 COLONIES/mL PSEUDOMONAS AERUGINOSA  SARS Coronavirus 2 [facility] order, Performed in [place] hospital lab)     Status: None   Collection Time: 11/21/18  9:43 PM  Result Value Ref Range Status   SARS Coronavirus 2 NEGATIVE NEGATIVE Final    Comment: (NOTE) If result is NEGATIVE SARS-CoV-2 target nucleic acids are NOT DETECTED. The SARS-CoV-2 RNA is generally detectable in upper and lower  respiratory specimens during the acute phase of infection. The lowest  concentration of SARS-CoV-2 viral copies this assay can detect is 250  copies / mL. A negative result does not preclude SARS-CoV-2 infection  and should not be used as the sole basis for treatment or other  patient management decisions.  A negative result may occur with  improper specimen collection / handling, submission of specimen other  than nasopharyngeal swab, presence of viral mutation(s) within the  areas  targeted by this assay, and inadequate number of viral copies  (<250 copies / mL). A negative result must be combined with clinical  observations, patient history, and epidemiological information. If result is POSITIVE SARS-CoV-2 target nucleic acids are DETECTED. The SARS-CoV-2 RNA is generally detectable in upper and lower  respiratory specimens dur ing the acute phase of infection.  Positive  results are indicative of active infection with SARS-CoV-2.  Clinical  correlation with patient history and other diagnostic information is  necessary to determine patient infection status.  Positive results do  not rule out bacterial infection or co-infection with other  viruses. If result is PRESUMPTIVE POSTIVE SARS-CoV-2 nucleic acids MAY BE PRESENT.   A presumptive positive result was obtained on the submitted specimen  and confirmed on repeat testing.  While 2019 novel coronavirus  (SARS-CoV-2) nucleic acids may be present in the submitted sample  additional confirmatory testing may be necessary for epidemiological  and / or clinical management purposes  to differentiate between  SARS-CoV-2 and other Sarbecovirus currently known to infect humans.  If clinically indicated additional testing with an alternate test  methodology [phone]) is advised. The SARS-CoV-2 RNA is generally  detectable in upper and lower respiratory sp ecimens during the acute  phase of infection. The expected result is Negative. Fact Sheet for Patients:  StrictlyIdeas.no Fact Sheet for Healthcare Providers: BankingDealers.co.za This test is not yet approved or cleared by the [place] FDA and has been authorized for detection and/or diagnosis of SARS-CoV-2 by FDA under an Emergency Use Authorization (EUA).  This EUA will remain in effect (meaning this test can be used) for the duration of the COVID-19 declaration under Section 564(b)(1) of the Act, 21 U.S.C. section 360bbb-3(b)(1), unless the authorization is terminated or revoked sooner. Performed at [facility], [address] [place], [place] [address]     Coagulation Studies: No results for input(s): LABPROT, INR in the last 72 hours.  Urinalysis: Recent Labs    11/21/18 1258 11/21/18 1948  COLORURINE  --  YELLOW*  LABSPEC  --  1.008  PHURINE  --  6.0  GLUCOSEU Negative 50*  HGBUR  --  LARGE*  BILIRUBINUR Negative NEGATIVE  KETONESUR  --  NEGATIVE  PROTEINUR 2+* 30*  NITRITE Negative NEGATIVE  LEUKOCYTESUR 3+* LARGE*      Imaging: No results found.   Medications:   . sodium chloride 50 mL/hr at 11/24/18 0909  . cefTAZidime (FORTAZ)  IV      . amLODipine  2.5 mg Oral Daily  . vitamin C  250 mg Oral Daily   And  . ferrous sulfate  325 mg Oral Q breakfast  . finasteride  5 mg Oral Daily  . insulin aspart  0-5 Units Subcutaneous QHS  . insulin aspart  0-9 Units Subcutaneous TID WC  . metoprolol tartrate  25 mg Oral BID  . multivitamins with iron  1 tablet Oral Daily  . pantoprazole  40 mg Oral Daily  . rosuvastatin  5 mg Oral QHS  . sodium bicarbonate  650 mg Oral BID  . tamsulosin  0.4 mg Oral Daily   acetaminophen **OR** acetaminophen, ondansetron **OR** ondansetron (ZOFRAN) IV, polyethylene glycol, traZODone  Assessment/ Plan:  85 y.o. male with medical problems of high-grade bladder cancer and incompletely resected tumor, history of bilateral hydronephrosis, history of prostate cancer, recurrent UTIs, chronic bilateral hydro-nephrosis, atrial fibrillation, pacemaker/defibrillator in place who was admitted to [facility] on 11/21/2018 for evaluation of Worsening renal function.    Active  Problems:   Acute renal failure (ARF) (HCC)   #.  Acute renal failure on CKD st 4.  Baseline creatinine 3.86/GFR 15 from November 09, 2018 Differential for acute renal failure includes bladder outlet obstruction from Foley malfunction, and urinary tract infection Foley has been replaced.  Serum creatinine has started to improve Urine output is satisfactory No acute indication for dialysis at present   Recent Labs    11/21/18 1948 11/22/18 0334 11/23/18 0345 11/24/18 0419  CREATININE 5.54* 5.69* 5.20* 4.89*    #. Anemia of CKD  Lab Results  Component Value Date   HGB 7.8 (L) 11/24/2018  Continue to monitor Not a candidate for Epogen due to underlying malignancy Can consider blood transfusion for symptomatic anemia  #. SHPTH  No results found for: PTH Lab Results  Component Value Date   PHOS 4.7 (H) 11/24/2018  Will continue to monitor   #. Diabetes type 2 with CKD Hemoglobin A1C (%)  Date Value  12/03/2012 7.1 (H)    Hgb A1c MFr Bld (%)  Date Value  11/22/2018 7.0 (H)   #Urinary tract infection -Pseudomonas aeruginosa Currently being treated with IV ceftazidime -We will need to figure out plan for outpatient treatment.   Called and updated patient's daughter Dr. Miquel Dunn, DDS [phone]   LOS: [place] 5/15/20209:52 AM  [place], [place]

## 2018-11-25 LAB — RENAL FUNCTION PANEL
Albumin: 2.4 g/dL — ABNORMAL LOW (ref 3.5–5.0)
Anion gap: 12 (ref 5–15)
BUN: 65 mg/dL — ABNORMAL HIGH (ref 8–23)
CO2: 19 mmol/L — ABNORMAL LOW (ref 22–32)
Calcium: 8.3 mg/dL — ABNORMAL LOW (ref 8.9–10.3)
Chloride: 114 mmol/L — ABNORMAL HIGH (ref 98–111)
Creatinine, Ser: 4.63 mg/dL — ABNORMAL HIGH (ref 0.61–1.24)
GFR calc Af Amer: 12 mL/min — ABNORMAL LOW (ref >60)
GFR calc non Af Amer: 10 mL/min — ABNORMAL LOW (ref >60)
Glucose, Bld: 97 mg/dL (ref 70–99)
Phosphorus: 4.7 mg/dL — ABNORMAL HIGH (ref 2.5–4.6)
Potassium: 4.3 mmol/L (ref 3.5–5.1)
Sodium: 145 mmol/L (ref 135–145)

## 2018-11-25 LAB — GLUCOSE, CAPILLARY
Glucose-Capillary: 91 mg/dL (ref 70–99)
Glucose-Capillary: 132 mg/dL — ABNORMAL HIGH (ref 70–99)
Glucose-Capillary: 149 mg/dL — ABNORMAL HIGH (ref 70–99)
Glucose-Capillary: 130 mg/dL — ABNORMAL HIGH (ref 70–99)

## 2018-11-25 LAB — HEMOGLOBIN AND HEMATOCRIT, BLOOD
HCT: 25.5 % — ABNORMAL LOW (ref 39.0–52.0)
Hemoglobin: 8.1 g/dL — ABNORMAL LOW (ref 13.0–17.0)

## 2018-11-25 LAB — PARATHYROID HORMONE, INTACT (NO CA): PTH: 103 pg/mL — ABNORMAL HIGH (ref 15–65)

## 2018-11-25 NOTE — Progress Notes (Addendum)
[place] at [place] NAME: Blake [name]    MR#:  [id number]  DATE OF BIRTH:  [birth year]    CHIEF COMPLAINT:   Chief Complaint  Patient presents with  . Abnormal Lab    Patient is doing better currently denying any symptoms renal function slowly improved   REVIEW OF SYSTEMS:   Review of Systems  Constitutional: Negative for chills and fever.  HENT: Negative for hearing loss.   Eyes: Negative for blurred vision, double vision and photophobia.  Respiratory: Negative for cough, hemoptysis and shortness of breath.   Cardiovascular: Negative for palpitations, orthopnea and leg swelling.  Gastrointestinal: Negative for abdominal pain, diarrhea and vomiting.  Genitourinary: Negative for dysuria and urgency.  Musculoskeletal: Negative for myalgias and neck pain.  Skin: Negative for rash.  Neurological: Negative for dizziness, focal weakness, seizures, weakness and headaches.  Psychiatric/Behavioral: Negative for memory loss. The patient does not have insomnia.     DRUG ALLERGIES:   Allergies  Allergen Reactions  . Diovan [Valsartan] Cough  . Gabapentin Other (See Comments)    Sedation at all doses  . Hydralazine Itching  . Lisinopril Cough  . Pregabalin Other (See Comments)    Sedation at all doses  . Levofloxacin Other (See Comments)    Too many side effects. Nausea, vomiting, upset stomach, increased confusion, etc Other reaction(s): Confusion Nausea, chills Nausea, chills   . Sulfamethoxazole-Trimethoprim Other (See Comments)    Too many side effects. Nausea, vomiting, upset stomach, increased confusion, etc    VITALS:  Blood pressure (!) 142/106, pulse 79, temperature 98.5 F (36.9 C), temperature source Oral, resp. rate 16, height 6\' 2"  (1.88 m), weight 99.2 kg, SpO2 99 %.  PHYSICAL EXAMINATION:  GENERAL:  85 y.o.-year-old patient lying in the bed with no acute distress.  EYES: Pupils equal, round, reactive to  lightNo scleral icterus. Extraocular muscles intact.  HEENT: Head atraumatic, normocephalic. Oropharynx and nasopharynx clear.  NECK:  Supple, no jugular venous distention. No thyroid enlargement, no tenderness.  LUNGS: Normal breath sounds bilaterally, no wheezing, rales,rhonchi or crepitation. No use of accessory muscles of respiration.  CARDIOVASCULAR: S1, S2 normal. No murmurs, rubs, or gallops.  ABDOMEN: Soft, nontender, nondistended. Bowel sounds present. No organomegaly or mass.  EXTREMITIES: No pedal edema, cyanosis, or clubbing.  NEUROLOGIC:, Awake, Oriented.  PSYCHIATRIC alert, awake, oriented.Marland Kitchen  SKIN: No obvious rash, lesion, or ulcer.    LABORATORY PANEL:   CBC Recent Labs  Lab 11/22/18 0334  11/25/18 0536  WBC 10.7*  --   --   HGB 8.4*   < > 8.1*  HCT 26.3*   < > 25.5*  PLT 120*  --   --    < > = values in this interval not displayed.   ------------------------------------------------------------------------------------------------------------------  Chemistries  Recent Labs  Lab 11/21/18 1948  11/25/18 0536  NA 131*   < > 145  K 4.6   < > 4.3  CL 99   < > 114*  CO2 20*   < > 19*  GLUCOSE 242*   < > 97  BUN 78*   < > 65*  CREATININE 5.54*   < > 4.63*  CALCIUM 8.1*   < > 8.3*  AST 34  --   --   ALT 32  --   --   ALKPHOS 82  --   --   BILITOT 0.4  --   --    < > = values in this  interval not displayed.   ------------------------------------------------------------------------------------------------------------------  Cardiac Enzymes No results for input(s): TROPONINI in the last 168 hours. ------------------------------------------------------------------------------------------------------------------  RADIOLOGY:  No results found.  EKG:   Orders placed or performed during the hospital encounter of 11/21/18  . EKG 12-Lead  . EKG 12-Lead    ASSESSMENT AND PLAN:   #1 acute on chronic renal failure, chronic kidney disease stage IV: Chronic  bilateral hydronephrosis, status post Foley change, renal function improved continue IV fluids Continue Flomax and Proscar    #2.  Sepsis with Pseudomonas ; continue Fortaz . #3 metabolic encephalopathy due to UTI sepsis and renal failure, improving.    #4 hypertension continue Lopressor  #5  Hyperlipidemia continue Crestor    CODE STATUS: Full code TOTAL TIME TAKING CARE OF THIS PATIENT: 22minutes.   POSSIBLE D/C IN 2-3 DAYS, DEPENDING ON CLINICAL CONDITION.   Dustin Flock M.D on 11/25/2018 at 12:55 PM  Between 7am to 6pm - Pager - [phone]  After 6pm go to [email or web address] - password [email or web address] [facility]  Office  [phone]  CC: Primary care physician; Adin Hector, MD   Note: This dictation was prepared with Dragon dictation along with smaller phrase technology. Any transcriptional errors that result from this process are unintentional.

## 2018-11-25 NOTE — Progress Notes (Signed)
[facility], [place] 11/25/18  Subjective:   LOS: 4 05/15 0701 - 05/16 0700 In: 2140.2 [P.O.:360; I.V.:1683.8; IV Piggyback:96.4] Out: 2450 [Urine:2450] Patient is doing better today Urine output 2450 cc Serum creatinine improved 5.2 to 4.9->4.6 Patient is more interactive today.    Denies any acute complaints or shortness of breath Iv saline 50 cc/hr States he got up from bed to chair  Objective:  Vital signs in last 24 hours:  Temp:  [98.2 F (36.8 C)-98.7 F (37.1 C)] 98.5 F (36.9 C) (05/16 0422) Pulse Rate:  [71-94] 72 (05/16 0422) Resp:  [18-20] 20 (05/16 0422) BP: (135-159)/(82-96) 135/82 (05/16 0422) SpO2:  [96 %-100 %] 100 % (05/16 0422)  Weight change:  Filed Weights   11/21/18 1828 11/21/18 2334  Weight: 82.6 kg 99.2 kg    Intake/Output:    Intake/Output Summary (Last 24 hours) at 11/25/2018 1048 Last data filed at 11/25/2018 0800 Gross per 24 hour  Intake 1154.49 ml  Output 2350 ml  Net -1195.51 ml    General:  Frail, elderly gentleman, laying in the bed  HEENT  anicteric, moist oral mucous membranes  Neck:  Supple, no JVD  Lungs:  Normal breathing effort, clear to auscultation, room air  Heart::  Irregular rhythm, prominent systolic murmur  Abdomen:  Soft, nontender  Extremities:  + Peripheral edema  Neurologic:   Alert, able to answer few questions appropriately  Skin:  No acute rashes  Foley:  Catheter present with clear yellow urine    Basic Metabolic Panel:  Recent Labs  Lab 11/21/18 1948 11/22/18 0334 11/23/18 0345 11/24/18 0419 11/25/18 0536  NA 131* 135 141 142 145  K 4.6 4.2 4.1 4.1 4.3  CL 99 107 109 111 114*  CO2 20* 19* 20* 20* 19*  GLUCOSE 242* 152* 128* 115* 97  BUN 78* 76* 74* 69* 65*  CREATININE 5.54* 5.69* 5.20* 4.89* 4.63*  CALCIUM 8.1* 8.2* 7.9* 7.9* 8.3*  PHOS  --   --  5.1* 4.7* 4.7*     CBC: Recent Labs  Lab 11/21/18 1948 11/22/18 0334 11/23/18 0345 11/24/18 0419  11/25/18 0536  WBC 11.0* 10.7*  --   --   --   NEUTROABS 9.4*  --   --   --   --   HGB 10.1* 8.4* 8.0* 7.8* 8.1*  HCT 30.9* 26.3* 25.5* 25.0* 25.5*  MCV 84.9 86.8  --   --   --   PLT 136* 120*  --   --   --      No results found for: HEPBSAG, HEPBSAB, HEPBIGM    Microbiology:  Recent Results (from the past 240 hour(s))  CULTURE, URINE COMPREHENSIVE     Status: Abnormal   Collection Time: 11/21/18 12:57 PM  Result Value Ref Range Status   Urine Culture, Comprehensive Final report (A)  Final   Organism ID, Bacteria Comment (A)  Final    Comment: Pseudomonas aeruginosa Greater than 100,000 colony forming units per mL    ANTIMICROBIAL SUSCEPTIBILITY Comment  Final    Comment:       ** S = Susceptible; I = Intermediate; R = Resistant **                    P = Positive; N = Negative             MICS are expressed in micrograms per mL    Antibiotic  RSLT#1    RSLT#2    RSLT#3    RSLT#4 Amikacin                       S Cefepime                       S Ceftazidime                    S Ciprofloxacin                  I Gentamicin                     S Imipenem                       S Levofloxacin                   I Meropenem                      S Piperacillin                   S Ticarcillin                    S Tobramycin                     S   Microscopic Examination     Status: Abnormal   Collection Time: 11/21/18 12:58 PM  Result Value Ref Range Status   WBC, UA >30 (A) 0 - 5 /hpf Final   RBC 3-10 (A) 0 - 2 /hpf Final   Epithelial Cells (non renal) None seen 0 - 10 /hpf Final   Bacteria, UA Moderate (A) None seen/Few Final  Urine culture     Status: Abnormal   Collection Time: 11/21/18  7:48 PM  Result Value Ref Range Status   Specimen Description   Final    URINE, RANDOM Performed at [facility], [address], [place], [place] [address]    Special Requests   Final    NONE Performed at [facility], [place], [place] [address]    Culture 80,000 COLONIES/mL PSEUDOMONAS AERUGINOSA (A)  Final   Report Status 11/24/2018 FINAL  Final   Organism ID, Bacteria PSEUDOMONAS AERUGINOSA (A)  Final      Susceptibility   Pseudomonas aeruginosa - MIC*    CEFTAZIDIME 4 SENSITIVE Sensitive     CIPROFLOXACIN 2 INTERMEDIATE Intermediate     GENTAMICIN <=1 SENSITIVE Sensitive     IMIPENEM 2 SENSITIVE Sensitive     PIP/TAZO 8 SENSITIVE Sensitive     CEFEPIME 4 SENSITIVE Sensitive     * 80,000 COLONIES/mL PSEUDOMONAS AERUGINOSA  SARS Coronavirus 2 [facility] order, Performed in [place] hospital lab)     Status: None   Collection Time: 11/21/18  9:43 PM  Result Value Ref Range Status   SARS Coronavirus 2 NEGATIVE NEGATIVE Final    Comment: (NOTE) If result is NEGATIVE SARS-CoV-2 target nucleic acids are NOT DETECTED. The SARS-CoV-2 RNA is generally detectable in upper and lower  respiratory specimens during the acute phase of infection. The lowest  concentration of SARS-CoV-2 viral copies this assay can detect is 250  copies / mL. A negative result does not preclude SARS-CoV-2 infection  and should not be used as the sole basis for  treatment or other  patient management decisions.  A negative result may occur with  improper specimen collection / handling, submission of specimen other  than nasopharyngeal swab, presence of viral mutation(s) within the  areas targeted by this assay, and inadequate number of viral copies  (<250 copies / mL). A negative result must be combined with clinical  observations, patient history, and epidemiological information. If result is POSITIVE SARS-CoV-2 target nucleic acids are DETECTED. The SARS-CoV-2 RNA is generally detectable in upper and lower  respiratory specimens dur ing the acute phase of infection.  Positive  results are indicative of active infection with SARS-CoV-2.  Clinical  correlation with patient history and other diagnostic information is  necessary to  determine patient infection status.  Positive results do  not rule out bacterial infection or co-infection with other viruses. If result is PRESUMPTIVE POSTIVE SARS-CoV-2 nucleic acids MAY BE PRESENT.   A presumptive positive result was obtained on the submitted specimen  and confirmed on repeat testing.  While 2019 novel coronavirus  (SARS-CoV-2) nucleic acids may be present in the submitted sample  additional confirmatory testing may be necessary for epidemiological  and / or clinical management purposes  to differentiate between  SARS-CoV-2 and other Sarbecovirus currently known to infect humans.  If clinically indicated additional testing with an alternate test  methodology [phone]) is advised. The SARS-CoV-2 RNA is generally  detectable in upper and lower respiratory sp ecimens during the acute  phase of infection. The expected result is Negative. Fact Sheet for Patients:  StrictlyIdeas.no Fact Sheet for Healthcare Providers: BankingDealers.co.za This test is not yet approved or cleared by the [place] FDA and has been authorized for detection and/or diagnosis of SARS-CoV-2 by FDA under an Emergency Use Authorization (EUA).  This EUA will remain in effect (meaning this test can be used) for the duration of the COVID-19 declaration under Section 564(b)(1) of the Act, 21 U.S.C. section 360bbb-3(b)(1), unless the authorization is terminated or revoked sooner. Performed at [facility], [address] [place], [place] [address]     Coagulation Studies: No results for input(s): LABPROT, INR in the last 72 hours.  Urinalysis: No results for input(s): COLORURINE, LABSPEC, PHURINE, GLUCOSEU, HGBUR, BILIRUBINUR, KETONESUR, PROTEINUR, UROBILINOGEN, NITRITE, LEUKOCYTESUR in the last 72 hours.  Invalid input(s): APPERANCEUR    Imaging: No results found.   Medications:   . sodium chloride 50 mL/hr at 11/25/18 0456   . cefTAZidime (FORTAZ)  IV Stopped (11/24/18 2150)   . amLODipine  2.5 mg Oral Daily  . vitamin C  250 mg Oral Daily   And  . ferrous sulfate  325 mg Oral Q breakfast  . finasteride  5 mg Oral Daily  . insulin aspart  0-5 Units Subcutaneous QHS  . insulin aspart  0-9 Units Subcutaneous TID WC  . metoprolol tartrate  25 mg Oral BID  . multivitamins with iron  1 tablet Oral Daily  . pantoprazole  40 mg Oral Daily  . rosuvastatin  5 mg Oral QHS  . sodium bicarbonate  650 mg Oral BID  . tamsulosin  0.4 mg Oral Daily   acetaminophen **OR** acetaminophen, guaiFENesin, ondansetron **OR** ondansetron (ZOFRAN) IV, polyethylene glycol, traZODone  Assessment/ Plan:  85 y.o. male with medical problems of high-grade bladder cancer and incompletely resected tumor, history of bilateral hydronephrosis, history of prostate cancer, recurrent UTIs, chronic bilateral hydro-nephrosis, atrial fibrillation, pacemaker/defibrillator in place who was admitted to [facility] on 11/21/2018 for evaluation of Worsening renal function.  Active Problems:   Acute renal failure (ARF) ([place])   #.  Acute renal failure on CKD st 4.  Baseline creatinine 3.86/GFR 15 from November 09, 2018 Differential for acute renal failure includes bladder outlet obstruction from Foley malfunction, and urinary tract infection Foley has been replaced.  Serum creatinine has started to improve Urine output is satisfactory No acute indication for dialysis at present   Recent Labs    11/22/18 0334 11/23/18 0345 11/24/18 0419 11/25/18 0536  CREATININE 5.69* 5.20* 4.89* 4.63*    #. Anemia of CKD  Lab Results  Component Value Date   HGB 8.1 (L) 11/25/2018  Continue to monitor Not a candidate for Epogen due to underlying malignancy Can consider blood transfusion for symptomatic anemia  #. SHPTH     Component Value Date/Time   PTH 103 (H) 11/24/2018 0419   Lab Results  Component Value Date   PHOS 4.7 (H) 11/25/2018  Will continue  to monitor   #. Diabetes type 2 with CKD Hemoglobin A1C (%)  Date Value  12/03/2012 7.1 (H)   Hgb A1c MFr Bld (%)  Date Value  11/22/2018 7.0 (H)   #Urinary tract infection -Pseudomonas aeruginosa Currently being treated with IV ceftazidime, Intermediate sensitivity to oral agent Cipro   Called and updated patient's daughter Dr. Miquel Dunn, DDS [phone]   LOS: [place] 5/16/202010:48 AM  [facility] [place], [place]

## 2018-11-26 LAB — CBC
HCT: 25.1 % — ABNORMAL LOW (ref 39.0–52.0)
Hemoglobin: 7.9 g/dL — ABNORMAL LOW (ref 13.0–17.0)
MCH: 27.1 pg (ref 26.0–34.0)
MCHC: 31.5 g/dL (ref 30.0–36.0)
MCV: 86 fL (ref 80.0–100.0)
Platelets: 128 10*3/uL — ABNORMAL LOW (ref 150–400)
RBC: 2.92 MIL/uL — ABNORMAL LOW (ref 4.22–5.81)
RDW: 15.9 % — ABNORMAL HIGH (ref 11.5–15.5)
WBC: 6.6 10*3/uL (ref 4.0–10.5)
nRBC: 0 % (ref 0.0–0.2)

## 2018-11-26 LAB — RENAL FUNCTION PANEL
Albumin: 2.4 g/dL — ABNORMAL LOW (ref 3.5–5.0)
Anion gap: 10 (ref 5–15)
BUN: 67 mg/dL — ABNORMAL HIGH (ref 8–23)
CO2: 21 mmol/L — ABNORMAL LOW (ref 22–32)
Calcium: 8.3 mg/dL — ABNORMAL LOW (ref 8.9–10.3)
Chloride: 112 mmol/L — ABNORMAL HIGH (ref 98–111)
Creatinine, Ser: 4.74 mg/dL — ABNORMAL HIGH (ref 0.61–1.24)
GFR calc Af Amer: 11 mL/min — ABNORMAL LOW
GFR calc non Af Amer: 10 mL/min — ABNORMAL LOW
Glucose, Bld: 114 mg/dL — ABNORMAL HIGH (ref 70–99)
Phosphorus: 4.9 mg/dL — ABNORMAL HIGH (ref 2.5–4.6)
Potassium: 4.3 mmol/L (ref 3.5–5.1)
Sodium: 143 mmol/L (ref 135–145)

## 2018-11-26 LAB — GLUCOSE, CAPILLARY
Glucose-Capillary: 110 mg/dL — ABNORMAL HIGH (ref 70–99)
Glucose-Capillary: 142 mg/dL — ABNORMAL HIGH (ref 70–99)
Glucose-Capillary: 97 mg/dL (ref 70–99)

## 2018-11-26 NOTE — Progress Notes (Signed)
Daughter, Lazarus Salines called for update on her father; update given; acknowledged; transferred call to room for patient to talk with daughter. Barbaraann Faster, RN 9:00 PM 11/26/2018

## 2018-11-26 NOTE — Progress Notes (Signed)
Pharmacy Antibiotic Note  Blake [name] is a 85 y.o. male admitted on 11/21/2018 with UTI.  Pharmacy has been consulted for ceftazidime dosing. Ucx= Pseudomonas+. High-grade bladder cancer and incompletely resected tumor, history of bilateral hydronephrosis, history of prostate cancer, recurrent UTIs, chronic bilateral hydro-nephrosis, atrial fibrillation, pacemaker/defibrillator  Noted that Ucx from 3/26 growing >100K P. Aeruginosa had intermediate susc to meropenem.  There are not GNR in Bcx  Plan: Day 4 of abx. Continue ceftazidime 1gm IV q24H.  Sensitivities= Intermediate to cipro, sensitive to ceftazidime   Height: 6\' 2"  (188 cm) Weight: 218 lb 11.1 oz (99.2 kg) IBW/kg (Calculated) : 82.2  Temp (24hrs), Avg:98.9 F (37.2 C), Min:98.7 F (37.1 C), Max:99.1 F (37.3 C)  Recent Labs  Lab 11/21/18 1948 11/22/18 0334 11/23/18 0345 11/24/18 0419 11/25/18 0536 11/26/18 0443  WBC 11.0* 10.7*  --   --   --  6.6  CREATININE 5.54* 5.69* 5.20* 4.89* 4.63* 4.74*    Estimated Creatinine Clearance: 12 mL/min (A) (by C-G formula based on SCr of 4.74 mg/dL (H)).    Allergies  Allergen Reactions  . Diovan [Valsartan] Cough  . Gabapentin Other (See Comments)    Sedation at all doses  . Hydralazine Itching  . Lisinopril Cough  . Pregabalin Other (See Comments)    Sedation at all doses  . Levofloxacin Other (See Comments)    Too many side effects. Nausea, vomiting, upset stomach, increased confusion, etc Other reaction(s): Confusion Nausea, chills Nausea, chills   . Sulfamethoxazole-Trimethoprim Other (See Comments)    Too many side effects. Nausea, vomiting, upset stomach, increased confusion, etc    Antimicrobials this admission: CTX 5/13> 5/14 Meropenem 5/14 >> 5/14 Ceftazidime 5/14 >>  Dose adjustments this admission:  ceftaz 500 mg q24H >> ceftaz 1g q24H.   Microbiology results: 5/12 UCx: Pseudomonas aeruginosa  5/12 Covid negative    Thank you for allowing  pharmacy to be a part of this patient's care.  Oswald Hillock, PharmD, BCPS 11/26/2018 11:50 AM

## 2018-11-26 NOTE — Progress Notes (Signed)
[facility] [place] NAME: Blake [name]    MR#:  [id number]  DATE OF BIRTH:  05-Jan-1923    CHIEF COMPLAINT:   Chief Complaint  Patient presents with  . Abnormal Lab    Renal function remains same   REVIEW OF SYSTEMS:   Review of Systems  Constitutional: Negative for chills and fever.  HENT: Negative for hearing loss.   Eyes: Negative for blurred vision, double vision and photophobia.  Respiratory: Negative for cough, hemoptysis and shortness of breath.   Cardiovascular: Negative for palpitations, orthopnea and leg swelling.  Gastrointestinal: Negative for abdominal pain, diarrhea and vomiting.  Genitourinary: Negative for dysuria and urgency.  Musculoskeletal: Negative for myalgias and neck pain.  Skin: Negative for rash.  Neurological: Negative for dizziness, focal weakness, seizures, weakness and headaches.  Psychiatric/Behavioral: Negative for memory loss. The patient does not have insomnia.     DRUG ALLERGIES:   Allergies  Allergen Reactions  . Diovan [Valsartan] Cough  . Gabapentin Other (See Comments)    Sedation at all doses  . Hydralazine Itching  . Lisinopril Cough  . Pregabalin Other (See Comments)    Sedation at all doses  . Levofloxacin Other (See Comments)    Too many side effects. Nausea, vomiting, upset stomach, increased confusion, etc Other reaction(s): Confusion Nausea, chills Nausea, chills   . Sulfamethoxazole-Trimethoprim Other (See Comments)    Too many side effects. Nausea, vomiting, upset stomach, increased confusion, etc    VITALS:  Blood pressure 132/83, pulse 60, temperature 98.9 F (37.2 C), temperature source Oral, resp. rate 16, height 6\' 2"  (1.88 m), weight 99.2 kg, SpO2 98 %.  PHYSICAL EXAMINATION:  GENERAL:  85 y.o.-year-old patient lying in the bed with no acute distress.  EYES: Pupils equal, round, reactive to lightNo scleral icterus. Extraocular muscles intact.  HEENT:  Head atraumatic, normocephalic. Oropharynx and nasopharynx clear.  NECK:  Supple, no jugular venous distention. No thyroid enlargement, no tenderness.  LUNGS: Normal breath sounds bilaterally, no wheezing, rales,rhonchi or crepitation. No use of accessory muscles of respiration.  CARDIOVASCULAR: S1, S2 normal. No murmurs, rubs, or gallops.  ABDOMEN: Soft, nontender, nondistended. Bowel sounds present. No organomegaly or mass.  EXTREMITIES: No pedal edema, cyanosis, or clubbing.  NEUROLOGIC:, Awake, Oriented.  PSYCHIATRIC alert, awake, oriented.Marland Kitchen  SKIN: No obvious rash, lesion, or ulcer.    LABORATORY PANEL:   CBC Recent Labs  Lab 11/26/18 0443  WBC 6.6  HGB 7.9*  HCT 25.1*  PLT 128*   ------------------------------------------------------------------------------------------------------------------  Chemistries  Recent Labs  Lab 11/21/18 1948  11/26/18 0443  NA 131*   < > 143  K 4.6   < > 4.3  CL 99   < > 112*  CO2 20*   < > 21*  GLUCOSE 242*   < > 114*  BUN 78*   < > 67*  CREATININE 5.54*   < > 4.74*  CALCIUM 8.1*   < > 8.3*  AST 34  --   --   ALT 32  --   --   ALKPHOS 82  --   --   BILITOT 0.4  --   --    < > = values in this interval not displayed.   ------------------------------------------------------------------------------------------------------------------  Cardiac Enzymes No results for input(s): TROPONINI in the last 168 hours. ------------------------------------------------------------------------------------------------------------------  RADIOLOGY:  No results found.  EKG:   Orders placed or performed during the hospital encounter of 11/21/18  . EKG 12-Lead  .  EKG 12-Lead    ASSESSMENT AND PLAN:   #1 acute on chronic renal failure, chronic kidney disease stage IV: Chronic bilateral hydronephrosis, status post Foley change, renal function is playing same this could be possibly be his new baseline   #2.  Sepsis with Pseudomonas ; continue  Fortaz . #3 metabolic encephalopathy due to UTI sepsis and renal failure, improving.    #4 hypertension continue Lopressor  #5  Hyperlipidemia continue Crestor    CODE STATUS: Full code TOTAL TIME TAKING CARE OF THIS PATIENT: 106minutes.   POSSIBLE D/C IN 2-3 DAYS, DEPENDING ON CLINICAL CONDITION.   Dustin Flock M.D on 11/26/2018 at 1:24 PM  Between 7am to 6pm - Pager - [phone]  After 6pm go to [email or web address] - password [email or web address] [facility]  Office  [phone]  CC: Primary care physician; Adin Hector, MD   Note: This dictation was prepared with Dragon dictation along with smaller phrase technology. Any transcriptional errors that result from this process are unintentional.

## 2018-11-26 NOTE — Progress Notes (Signed)
[facility], [place] 11/26/18  Subjective:   LOS: 5 05/16 0701 - 05/17 0700 In: 2703.1 [P.O.:450; I.V.:1203.1; IV Piggyback:100] Out: 1350 [Urine:1350] Patient is doing fair today Urine output 1350 cc Serum creatinine stabilizing at 4.7 Denies any acute complaints or shortness of breath   Objective:  Vital signs in last 24 hours:  Temp:  [98.5 F (36.9 C)-99.1 F (37.3 C)] 98.7 F (37.1 C) (05/17 0500) Pulse Rate:  [47-92] 47 (05/17 0500) Resp:  [16-18] 16 (05/17 0500) BP: (142-153)/(88-106) 144/94 (05/17 0500) SpO2:  [97 %-100 %] 97 % (05/17 0500)  Weight change:  Filed Weights   11/21/18 1828 11/21/18 2334  Weight: 82.6 kg 99.2 kg    Intake/Output:    Intake/Output Summary (Last 24 hours) at 11/26/2018 0957 Last data filed at 11/26/2018 0900 Gross per 24 hour  Intake 3103.06 ml  Output 950 ml  Net 2153.06 ml    General:  Frail, elderly gentleman, laying in the bed  HEENT  anicteric, moist oral mucous membranes  Neck:  Supple, no JVD  Lungs:  Normal breathing effort, clear to auscultation  Heart::  Irregular rhythm, prominent systolic murmur  Abdomen:  Soft, nontender  Extremities:  + Peripheral edema  Neurologic:   Alert, able to answer few questions appropriately  Skin:  No acute rashes  Foley:  Catheter present with clear yellow urine    Basic Metabolic Panel:  Recent Labs  Lab 11/22/18 0334 11/23/18 0345 11/24/18 0419 11/25/18 0536 11/26/18 0443  NA 135 141 142 145 143  K 4.2 4.1 4.1 4.3 4.3  CL 107 109 111 114* 112*  CO2 19* 20* 20* 19* 21*  GLUCOSE 152* 128* 115* 97 114*  BUN 76* 74* 69* 65* 67*  CREATININE 5.69* 5.20* 4.89* 4.63* 4.74*  CALCIUM 8.2* 7.9* 7.9* 8.3* 8.3*  PHOS  --  5.1* 4.7* 4.7* 4.9*     CBC: Recent Labs  Lab 11/21/18 1948 11/22/18 0334 11/23/18 0345 11/24/18 0419 11/25/18 0536 11/26/18 0443  WBC 11.0* 10.7*  --   --   --  6.6  NEUTROABS 9.4*  --   --   --   --   --   HGB 10.1*  8.4* 8.0* 7.8* 8.1* 7.9*  HCT 30.9* 26.3* 25.5* 25.0* 25.5* 25.1*  MCV 84.9 86.8  --   --   --  86.0  PLT 136* 120*  --   --   --  128*     No results found for: HEPBSAG, HEPBSAB, HEPBIGM    Microbiology:  Recent Results (from the past 240 hour(s))  CULTURE, URINE COMPREHENSIVE     Status: Abnormal   Collection Time: 11/21/18 12:57 PM  Result Value Ref Range Status   Urine Culture, Comprehensive Final report (A)  Final   Organism ID, Bacteria Comment (A)  Final    Comment: Pseudomonas aeruginosa Greater than 100,000 colony forming units per mL    ANTIMICROBIAL SUSCEPTIBILITY Comment  Final    Comment:       ** S = Susceptible; I = Intermediate; R = Resistant **                    P = Positive; N = Negative             MICS are expressed in micrograms per mL    Antibiotic                 RSLT#1    RSLT#2  RSLT#3    RSLT#4 Amikacin                       S Cefepime                       S Ceftazidime                    S Ciprofloxacin                  I Gentamicin                     S Imipenem                       S Levofloxacin                   I Meropenem                      S Piperacillin                   S Ticarcillin                    S Tobramycin                     S   Microscopic Examination     Status: Abnormal   Collection Time: 11/21/18 12:58 PM  Result Value Ref Range Status   WBC, UA >30 (A) 0 - 5 /hpf Final   RBC 3-10 (A) 0 - 2 /hpf Final   Epithelial Cells (non renal) None seen 0 - 10 /hpf Final   Bacteria, UA Moderate (A) None seen/Few Final  Urine culture     Status: Abnormal   Collection Time: 11/21/18  7:48 PM  Result Value Ref Range Status   Specimen Description   Final    URINE, RANDOM Performed at [facility], [address], [place], [place] [address]    Special Requests   Final    NONE Performed at [facility], [place], [place] [address]    Culture 80,000 COLONIES/mL PSEUDOMONAS AERUGINOSA (A)   Final   Report Status 11/24/2018 FINAL  Final   Organism ID, Bacteria PSEUDOMONAS AERUGINOSA (A)  Final      Susceptibility   Pseudomonas aeruginosa - MIC*    CEFTAZIDIME 4 SENSITIVE Sensitive     CIPROFLOXACIN 2 INTERMEDIATE Intermediate     GENTAMICIN <=1 SENSITIVE Sensitive     IMIPENEM 2 SENSITIVE Sensitive     PIP/TAZO 8 SENSITIVE Sensitive     CEFEPIME 4 SENSITIVE Sensitive     * 80,000 COLONIES/mL PSEUDOMONAS AERUGINOSA  SARS Coronavirus 2 [facility] order, Performed in [place] hospital lab)     Status: None   Collection Time: 11/21/18  9:43 PM  Result Value Ref Range Status   SARS Coronavirus 2 NEGATIVE NEGATIVE Final    Comment: (NOTE) If result is NEGATIVE SARS-CoV-2 target nucleic acids are NOT DETECTED. The SARS-CoV-2 RNA is generally detectable in upper and lower  respiratory specimens during the acute phase of infection. The lowest  concentration of SARS-CoV-2 viral copies this assay can detect is 250  copies / mL. A negative result does not preclude SARS-CoV-2 infection  and should not be used as the sole basis for treatment or other  patient management decisions.  A negative result may occur with  improper specimen collection / handling, submission of specimen other  than nasopharyngeal swab, presence of viral mutation(s) within the  areas targeted by this assay, and inadequate number of viral copies  (<250 copies / mL). A negative result must be combined with clinical  observations, patient history, and epidemiological information. If result is POSITIVE SARS-CoV-2 target nucleic acids are DETECTED. The SARS-CoV-2 RNA is generally detectable in upper and lower  respiratory specimens dur ing the acute phase of infection.  Positive  results are indicative of active infection with SARS-CoV-2.  Clinical  correlation with patient history and other diagnostic information is  necessary to determine patient infection status.  Positive results do  not rule out  bacterial infection or co-infection with other viruses. If result is PRESUMPTIVE POSTIVE SARS-CoV-2 nucleic acids MAY BE PRESENT.   A presumptive positive result was obtained on the submitted specimen  and confirmed on repeat testing.  While 2019 novel coronavirus  (SARS-CoV-2) nucleic acids may be present in the submitted sample  additional confirmatory testing may be necessary for epidemiological  and / or clinical management purposes  to differentiate between  SARS-CoV-2 and other Sarbecovirus currently known to infect humans.  If clinically indicated additional testing with an alternate test  methodology [phone]) is advised. The SARS-CoV-2 RNA is generally  detectable in upper and lower respiratory sp ecimens during the acute  phase of infection. The expected result is Negative. Fact Sheet for Patients:  StrictlyIdeas.no Fact Sheet for Healthcare Providers: BankingDealers.co.za This test is not yet approved or cleared by the [place] FDA and has been authorized for detection and/or diagnosis of SARS-CoV-2 by FDA under an Emergency Use Authorization (EUA).  This EUA will remain in effect (meaning this test can be used) for the duration of the COVID-19 declaration under Section 564(b)(1) of the Act, 21 U.S.C. section 360bbb-3(b)(1), unless the authorization is terminated or revoked sooner. Performed at [facility], [address] [place], [place] [address]     Coagulation Studies: No results for input(s): LABPROT, INR in the last 72 hours.  Urinalysis: No results for input(s): COLORURINE, LABSPEC, PHURINE, GLUCOSEU, HGBUR, BILIRUBINUR, KETONESUR, PROTEINUR, UROBILINOGEN, NITRITE, LEUKOCYTESUR in the last 72 hours.  Invalid input(s): APPERANCEUR    Imaging: No results found.   Medications:   . sodium chloride 50 mL/hr at 11/25/18 1705  . cefTAZidime (FORTAZ)  IV 1 g (11/25/18 2354)   . amLODipine  2.5 mg  Oral Daily  . vitamin C  250 mg Oral Daily   And  . ferrous sulfate  325 mg Oral Q breakfast  . finasteride  5 mg Oral Daily  . insulin aspart  0-5 Units Subcutaneous QHS  . insulin aspart  0-9 Units Subcutaneous TID WC  . metoprolol tartrate  25 mg Oral BID  . multivitamins with iron  1 tablet Oral Daily  . pantoprazole  40 mg Oral Daily  . rosuvastatin  5 mg Oral QHS  . sodium bicarbonate  650 mg Oral BID  . tamsulosin  0.4 mg Oral Daily   acetaminophen **OR** acetaminophen, guaiFENesin, ondansetron **OR** ondansetron (ZOFRAN) IV, polyethylene glycol, traZODone  Assessment/ Plan:  85 y.o. male with medical problems of high-grade bladder cancer and incompletely resected tumor, history of bilateral hydronephrosis, history of prostate cancer, recurrent UTIs, chronic bilateral hydro-nephrosis, atrial fibrillation, pacemaker/defibrillator in place who was admitted to [facility] on 11/21/2018 for evaluation of Worsening renal function.    Active Problems:   Acute renal failure (  ARF) ([place])   #.  Acute renal failure on CKD st 4.  Baseline creatinine 3.86/GFR 15 from November 09, 2018 Differential for acute renal failure includes bladder outlet obstruction from Foley malfunction, and urinary tract infection Foley has been replaced.  Serum creatinine has improved and is stabilizing 4.6-4.7 Urine output is satisfactory No acute indication for dialysis at present   Recent Labs    11/23/18 0345 11/24/18 0419 11/25/18 0536 11/26/18 0443  CREATININE 5.20* 4.89* 4.63* 4.74*    #. Anemia of CKD  Lab Results  Component Value Date   HGB 7.9 (L) 11/26/2018  Continue to monitor Not a candidate for Epogen due to underlying malignancy Can consider blood transfusion for symptomatic anemia  #. SHPTH     Component Value Date/Time   PTH 103 (H) 11/24/2018 0419   Lab Results  Component Value Date   PHOS 4.9 (H) 11/26/2018  Will continue to monitor   #. Diabetes type 2 with CKD Hemoglobin A1C  (%)  Date Value  12/03/2012 7.1 (H)   Hgb A1c MFr Bld (%)  Date Value  11/22/2018 7.0 (H)   #Urinary tract infection -Pseudomonas aeruginosa Currently being treated with IV ceftazidime -Outpatient antibiotics as per pharmacy/internal 101   Patient's daughter Dr. Miquel Dunn, DDS [phone]   LOS: [place] 5/17/20209:57 [place], [place]

## 2018-11-27 ENCOUNTER — Inpatient Hospital Stay: Payer: Medicare Other

## 2018-11-27 LAB — RENAL FUNCTION PANEL
Albumin: 2.4 g/dL — ABNORMAL LOW (ref 3.5–5.0)
Anion gap: 10 (ref 5–15)
BUN: 65 mg/dL — ABNORMAL HIGH (ref 8–23)
CO2: 20 mmol/L — ABNORMAL LOW (ref 22–32)
Calcium: 8.2 mg/dL — ABNORMAL LOW (ref 8.9–10.3)
Chloride: 116 mmol/L — ABNORMAL HIGH (ref 98–111)
Creatinine, Ser: 5.05 mg/dL — ABNORMAL HIGH (ref 0.61–1.24)
GFR calc Af Amer: 10 mL/min — ABNORMAL LOW (ref >60)
GFR calc non Af Amer: 9 mL/min — ABNORMAL LOW (ref >60)
Glucose, Bld: 105 mg/dL — ABNORMAL HIGH (ref 70–99)
Phosphorus: 5.1 mg/dL — ABNORMAL HIGH (ref 2.5–4.6)
Potassium: 4.5 mmol/L (ref 3.5–5.1)
Sodium: 146 mmol/L — ABNORMAL HIGH (ref 135–145)

## 2018-11-27 LAB — GLUCOSE, CAPILLARY
Glucose-Capillary: 138 mg/dL — ABNORMAL HIGH (ref 70–99)
Glucose-Capillary: 93 mg/dL (ref 70–99)
Glucose-Capillary: 150 mg/dL — ABNORMAL HIGH (ref 70–99)
Glucose-Capillary: 126 mg/dL — ABNORMAL HIGH (ref 70–99)
Glucose-Capillary: 106 mg/dL — ABNORMAL HIGH (ref 70–99)

## 2018-11-27 LAB — HEMOGLOBIN AND HEMATOCRIT, BLOOD
HCT: 24.8 % — ABNORMAL LOW (ref 39.0–52.0)
Hemoglobin: 7.7 g/dL — ABNORMAL LOW (ref 13.0–17.0)

## 2018-11-27 LAB — PROTEIN ELECTRO, RANDOM URINE
Albumin ELP, Urine: 22.4 %
Alpha-1-Globulin, U: 6.3 %
Alpha-2-Globulin, U: 19.6 %
Beta Globulin, U: 23.7 %
Gamma Globulin, U: 28 %
Total Protein, Urine: 45.6 mg/dL

## 2018-11-27 NOTE — Consult Note (Signed)
   11/27/2018 4:14 PM   Blake [name] August 06, 1922 [id number]  Reason for consult: Bilateral hydroureteronephrosis, recurrent UTIs  Blake [name] is a 85 year old male well-known to the urology service.  He is a patient of Dr. Bernardo Heater.  Please see my consult note from 11/06/2018 for his complex urologic history.  To briefly summarize, he is a 85 year old male with known history of bladder cancer and resected bladder tumors, with bilateral hydroureteronephrosis since at least January 2018.  This is likely secondary to either bladder outlet obstruction, or bilateral ureteral scarring from radiation for prostate cancer.  He has been admitted over the last week with worsening renal function and UTI.  His baseline creatinine is approximately 2.5, and was 3.86 at time of his most recent discharge.  He currently denies any complaints including flank pain, fevers, chills, or abdominal pain.  He has a Foley catheter that is draining clear yellow urine.  Renal ultrasound today confirms stable bilateral chronic hydronephrosis.    I had a very long conversation today with the patient, as well as his daughter Dr. Chrisandra Netters).  I discussed possible etiologies for his bilateral chronic hydroureteronephrosis and UTIs, most likely either chronic bladder outlet obstruction, versus bilateral ureteral scarring from radiation.  We discussed management options including observation, chronic Foley catheter placement with monthly changes, or more aggressive interventions with bilateral ureteral stent placements that would require general anesthesia, or bilateral nephrostomy tube placement with interventional radiology.  The goal would be to optimize drainage of his kidneys and therefore optimize renal function, as well as potentially decrease risk of recurrent urinary infections by avoiding stagnant urine in the kidneys bilaterally.  However, both of these interventions can have a significant negative impact on patient's quality of  life with flank pain, pain and irritation at the site of the nephrostomy tubes, bladder spasms, need for ongoing procedures to change the tubes or stents, and urinary urgency/frequency/incontinence.  -The patient and his daughter are both in agreement that they would like to trial a chronic Foley.  I recommended a 10 to 14-day course of culture appropriate antibiotics for his current Pseudomonas UTI, and considering transitioning to a daily low-dose antibiotic for UTI prophylaxis.  He has had multiple resistant bacteria in the past, so this is challenging. -Follow-up arranged with Dr. Bernardo Heater in urology clinic in 3 weeks for BMP and Foley change  [name], [name], [name], [name] [phone]

## 2018-11-27 NOTE — Progress Notes (Signed)
[facility] [place] NAME: Blake [name]    MR#:  [id number]  DATE OF BIRTH:  19-Mar-1923    CHIEF COMPLAINT:   Chief Complaint  Patient presents with  . Abnormal Lab    Renal function worsened today  REVIEW OF SYSTEMS:   Review of Systems  Constitutional: Negative for chills and fever.  HENT: Negative for hearing loss.   Eyes: Negative for blurred vision, double vision and photophobia.  Respiratory: Negative for cough, hemoptysis and shortness of breath.   Cardiovascular: Negative for palpitations, orthopnea and leg swelling.  Gastrointestinal: Negative for abdominal pain, diarrhea and vomiting.  Genitourinary: Negative for dysuria and urgency.  Musculoskeletal: Negative for myalgias and neck pain.  Skin: Negative for rash.  Neurological: Negative for dizziness, focal weakness, seizures, weakness and headaches.  Psychiatric/Behavioral: Negative for memory loss. The patient does not have insomnia.     DRUG ALLERGIES:   Allergies  Allergen Reactions  . Diovan [Valsartan] Cough  . Gabapentin Other (See Comments)    Sedation at all doses  . Hydralazine Itching  . Lisinopril Cough  . Pregabalin Other (See Comments)    Sedation at all doses  . Levofloxacin Other (See Comments)    Too many side effects. Nausea, vomiting, upset stomach, increased confusion, etc Other reaction(s): Confusion Nausea, chills Nausea, chills   . Sulfamethoxazole-Trimethoprim Other (See Comments)    Too many side effects. Nausea, vomiting, upset stomach, increased confusion, etc    VITALS:  Blood pressure (!) 156/85, pulse 69, temperature 98.2 F (36.8 C), temperature source Oral, resp. rate 17, height 6\' 2"  (1.88 m), weight 99.2 kg, SpO2 98 %.  PHYSICAL EXAMINATION:  GENERAL:  85 y.o.-year-old patient lying in the bed with no acute distress.  EYES: Pupils equal, round, reactive to lightNo scleral icterus. Extraocular muscles intact.   HEENT: Head atraumatic, normocephalic. Oropharynx and nasopharynx clear.  NECK:  Supple, no jugular venous distention. No thyroid enlargement, no tenderness.  LUNGS: Normal breath sounds bilaterally, no wheezing, rales,rhonchi or crepitation. No use of accessory muscles of respiration.  CARDIOVASCULAR: S1, S2 normal. No murmurs, rubs, or gallops.  ABDOMEN: Soft, nontender, nondistended. Bowel sounds present. No organomegaly or mass.  EXTREMITIES: No pedal edema, cyanosis, or clubbing.  NEUROLOGIC:, Awake, Oriented.  PSYCHIATRIC alert, awake, oriented.Marland Kitchen  SKIN: No obvious rash, lesion, or ulcer.    LABORATORY PANEL:   CBC Recent Labs  Lab 11/26/18 0443 11/27/18 0452  WBC 6.6  --   HGB 7.9* 7.7*  HCT 25.1* 24.8*  PLT 128*  --    ------------------------------------------------------------------------------------------------------------------  Chemistries  Recent Labs  Lab 11/21/18 1948  11/27/18 0452  NA 131*   < > 146*  K 4.6   < > 4.5  CL 99   < > 116*  CO2 20*   < > 20*  GLUCOSE 242*   < > 105*  BUN 78*   < > 65*  CREATININE 5.54*   < > 5.05*  CALCIUM 8.1*   < > 8.2*  AST 34  --   --   ALT 32  --   --   ALKPHOS 82  --   --   BILITOT 0.4  --   --    < > = values in this interval not displayed.   ------------------------------------------------------------------------------------------------------------------  Cardiac Enzymes No results for input(s): TROPONINI in the last 168 hours. ------------------------------------------------------------------------------------------------------------------  RADIOLOGY:  US Renal  Result Date: 11/27/2018 CLINICAL DATA:  85 year old male with  a history of acute renal failure EXAM: RENAL / URINARY TRACT ULTRASOUND COMPLETE COMPARISON:  CT 11/06/2018, ultrasound 11/06/2018 FINDINGS: Right Kidney: Length: 12.8 cm x 6.5 cm x 7.3 cm, 318 cc. Redemonstration of cortical thinning with hydronephrosis which was present on prior  ultrasound and CT. Echogenicity of the right kidney cortex appears increased from the internal standard of the liver. Left Kidney: Length: 13.9 cm x 7.2 cm x 7.9 cm, 413 cc. Redemonstration of left-sided hydronephrosis. Echogenicity seems relatively symmetric to the right. Bladder: Urinary catheter in position within the bladder IMPRESSION: Ultrasound demonstrates chronic bilateral hydronephrosis, unchanged from comparison CT and ultrasound. Increased echogenicity of the bilateral renal cortex, likely reflecting element of medical renal disease. Electronically Signed   By: Corrie Mckusick D.O.   On: 11/27/2018 10:33    EKG:   Orders placed or performed during the hospital encounter of 11/21/18  . EKG 12-Lead  . EKG 12-Lead    ASSESSMENT AND PLAN:   #1 acute on chronic renal failure, chronic kidney disease stage IV: Chronic bilateral hydronephrosis, status post Foley change, renal function has worsened I ordered a renal ultrasound which shows chronic hydronephrosis Family requesting urology consult I have sent a message to the on-call urologist to evaluate the patient   #2.  Sepsis with Pseudomonas ; continue Fortaz . #3 metabolic encephalopathy due to UTI sepsis and renal failure, improving.    #4 hypertension continue Lopressor  #5  Hyperlipidemia continue Crestor    CODE STATUS: Full code TOTAL TIME TAKING CARE OF THIS PATIENT: 39minutes.   POSSIBLE D/C IN 2-3 DAYS, DEPENDING ON CLINICAL CONDITION.   Dustin Flock M.D on 11/27/2018 at 12:13 PM  Between 7am to 6pm - Pager - [phone]  After 6pm go to [email or web address] - password [email or web address] [facility]  Office  [phone]  CC: Primary care physician; Adin Hector, MD   Note: This dictation was prepared with Dragon dictation along with smaller phrase technology. Any transcriptional errors that result from this process are unintentional.

## 2018-11-27 NOTE — Care Management Important Message (Signed)
Important Message  Patient Details  Name: Blake [name] MRN: [id number] Date of Birth: 1923-05-26   Medicare Important Message Given:  Yes    Shela Leff, LCSW 11/27/2018, 2:38 PM

## 2018-11-27 NOTE — Progress Notes (Signed)
Son Sebastiano Luecke. Called for update. Gave update. Son stated he was wondering if he would be discharging in the next few days. Stated to Son at this time I did not know when he would be discharged. Son stated he would check in again later.   Fuller Mandril, RN

## 2018-11-27 NOTE — Progress Notes (Signed)
[facility], [place] 11/27/18  Subjective:  Patient continues to have diminished renal function. Creatinine currently 5.05. However he has good urine output of 2 L over the preceding 24 hours.    Objective:  Vital signs in last 24 hours:  Temp:  [98 F (36.7 C)-98.9 F (37.2 C)] 98 F (36.7 C) (05/18 9323) Pulse Rate:  [60-70] 65 (05/18 0633) Resp:  [16-18] 18 (05/18 0633) BP: (132-163)/(78-85) 163/85 (05/18 0633) SpO2:  [96 %-100 %] 96 % (05/18 0633)  Weight change:  Filed Weights   11/21/18 1828 11/21/18 2334  Weight: 82.6 kg 99.2 kg    Intake/Output:    Intake/Output Summary (Last 24 hours) at 11/27/2018 1142 Last data filed at 11/27/2018 1053 Gross per 24 hour  Intake 2535.61 ml  Output 2050 ml  Net 485.61 ml    General:  Frail, elderly gentleman, laying in the bed  HEENT  anicteric, moist oral mucous membranes  Neck:  Supple  Lungs:  Normal breathing effort, clear to auscultation  Heart::  Irregular rhythm, prominent systolic murmur  Abdomen:  Soft, nontender  Extremities:  + Peripheral edema  Neurologic:   Alert, able to answer few questions appropriately  Skin:  No acute rashes  Foley:  Catheter present with clear yellow urine    Basic Metabolic Panel:  Recent Labs  Lab 11/23/18 0345 11/24/18 0419 11/25/18 0536 11/26/18 0443 11/27/18 0452  NA 141 142 145 143 146*  K 4.1 4.1 4.3 4.3 4.5  CL 109 111 114* 112* 116*  CO2 20* 20* 19* 21* 20*  GLUCOSE 128* 115* 97 114* 105*  BUN 74* 69* 65* 67* 65*  CREATININE 5.20* 4.89* 4.63* 4.74* 5.05*  CALCIUM 7.9* 7.9* 8.3* 8.3* 8.2*  PHOS 5.1* 4.7* 4.7* 4.9* 5.1*     CBC: Recent Labs  Lab 11/21/18 1948 11/22/18 0334 11/23/18 0345 11/24/18 0419 11/25/18 0536 11/26/18 0443 11/27/18 0452  WBC 11.0* 10.7*  --   --   --  6.6  --   NEUTROABS 9.4*  --   --   --   --   --   --   HGB 10.1* 8.4* 8.0* 7.8* 8.1* 7.9* 7.7*  HCT 30.9* 26.3* 25.5* 25.0* 25.5* 25.1* 24.8*  MCV 84.9  86.8  --   --   --  86.0  --   PLT 136* 120*  --   --   --  128*  --      No results found for: HEPBSAG, HEPBSAB, HEPBIGM    Microbiology:  Recent Results (from the past 240 hour(s))  CULTURE, URINE COMPREHENSIVE     Status: Abnormal   Collection Time: 11/21/18 12:57 PM  Result Value Ref Range Status   Urine Culture, Comprehensive Final report (A)  Final   Organism ID, Bacteria Comment (A)  Final    Comment: Pseudomonas aeruginosa Greater than 100,000 colony forming units per mL    ANTIMICROBIAL SUSCEPTIBILITY Comment  Final    Comment:       ** S = Susceptible; I = Intermediate; R = Resistant **                    P = Positive; N = Negative             MICS are expressed in micrograms per mL    Antibiotic                 RSLT#1    RSLT#2    RSLT#3  RSLT#4 Amikacin                       S Cefepime                       S Ceftazidime                    S Ciprofloxacin                  I Gentamicin                     S Imipenem                       S Levofloxacin                   I Meropenem                      S Piperacillin                   S Ticarcillin                    S Tobramycin                     S   Microscopic Examination     Status: Abnormal   Collection Time: 11/21/18 12:58 PM  Result Value Ref Range Status   WBC, UA >30 (A) 0 - 5 /hpf Final   RBC 3-10 (A) 0 - 2 /hpf Final   Epithelial Cells (non renal) None seen 0 - 10 /hpf Final   Bacteria, UA Moderate (A) None seen/Few Final  Urine culture     Status: Abnormal   Collection Time: 11/21/18  7:48 PM  Result Value Ref Range Status   Specimen Description   Final    URINE, RANDOM Performed at [facility], [address], [place], [place] [address]    Special Requests   Final    NONE Performed at [facility], [place],  [address]    Culture 80,000 COLONIES/mL PSEUDOMONAS AERUGINOSA (A)  Final   Report Status 11/24/2018 FINAL  Final   Organism ID,  Bacteria PSEUDOMONAS AERUGINOSA (A)  Final      Susceptibility   Pseudomonas aeruginosa - MIC*    CEFTAZIDIME 4 SENSITIVE Sensitive     CIPROFLOXACIN 2 INTERMEDIATE Intermediate     GENTAMICIN <=1 SENSITIVE Sensitive     IMIPENEM 2 SENSITIVE Sensitive     PIP/TAZO 8 SENSITIVE Sensitive     CEFEPIME 4 SENSITIVE Sensitive     * 80,000 COLONIES/mL PSEUDOMONAS AERUGINOSA  SARS Coronavirus 2 [facility] order, Performed in [place] hospital lab)     Status: None   Collection Time: 11/21/18  9:43 PM  Result Value Ref Range Status   SARS Coronavirus 2 NEGATIVE NEGATIVE Final    Comment: (NOTE) If result is NEGATIVE SARS-CoV-2 target nucleic acids are NOT DETECTED. The SARS-CoV-2 RNA is generally detectable in upper and lower  respiratory specimens during the acute phase of infection. The lowest  concentration of SARS-CoV-2 viral copies this assay can detect is 250  copies / mL. A negative result does not preclude SARS-CoV-2 infection  and should not be used as the sole basis for treatment or other  patient management decisions.  A negative result may  occur with  improper specimen collection / handling, submission of specimen other  than nasopharyngeal swab, presence of viral mutation(s) within the  areas targeted by this assay, and inadequate number of viral copies  (<250 copies / mL). A negative result must be combined with clinical  observations, patient history, and epidemiological information. If result is POSITIVE SARS-CoV-2 target nucleic acids are DETECTED. The SARS-CoV-2 RNA is generally detectable in upper and lower  respiratory specimens dur ing the acute phase of infection.  Positive  results are indicative of active infection with SARS-CoV-2.  Clinical  correlation with patient history and other diagnostic information is  necessary to determine patient infection status.  Positive results do  not rule out bacterial infection or co-infection with other viruses. If result is  PRESUMPTIVE POSTIVE SARS-CoV-2 nucleic acids MAY BE PRESENT.   A presumptive positive result was obtained on the submitted specimen  and confirmed on repeat testing.  While 2019 novel coronavirus  (SARS-CoV-2) nucleic acids may be present in the submitted sample  additional confirmatory testing may be necessary for epidemiological  and / or clinical management purposes  to differentiate between  SARS-CoV-2 and other Sarbecovirus currently known to infect humans.  If clinically indicated additional testing with an alternate test  methodology [phone]) is advised. The SARS-CoV-2 RNA is generally  detectable in upper and lower respiratory sp ecimens during the acute  phase of infection. The expected result is Negative. Fact Sheet for Patients:  StrictlyIdeas.no Fact Sheet for Healthcare Providers: BankingDealers.co.za This test is not yet approved or cleared by the [place] FDA and has been authorized for detection and/or diagnosis of SARS-CoV-2 by FDA under an Emergency Use Authorization (EUA).  This EUA will remain in effect (meaning this test can be used) for the duration of the COVID-19 declaration under Section 564(b)(1) of the Act, 21 U.S.C. section 360bbb-3(b)(1), unless the authorization is terminated or revoked sooner. Performed at [facility], [address] [place], [place] [address]     Coagulation Studies: No results for input(s): LABPROT, INR in the last 72 hours.  Urinalysis: No results for input(s): COLORURINE, LABSPEC, PHURINE, GLUCOSEU, HGBUR, BILIRUBINUR, KETONESUR, PROTEINUR, UROBILINOGEN, NITRITE, LEUKOCYTESUR in the last 72 hours.  Invalid input(s): APPERANCEUR    Imaging: US Renal  Result Date: 11/27/2018 CLINICAL DATA:  85 year old male with a history of acute renal failure EXAM: RENAL / URINARY TRACT ULTRASOUND COMPLETE COMPARISON:  CT 11/06/2018, ultrasound 11/06/2018 FINDINGS: Right Kidney:  Length: 12.8 cm x 6.5 cm x 7.3 cm, 318 cc. Redemonstration of cortical thinning with hydronephrosis which was present on prior ultrasound and CT. Echogenicity of the right kidney cortex appears increased from the internal standard of the liver. Left Kidney: Length: 13.9 cm x 7.2 cm x 7.9 cm, 413 cc. Redemonstration of left-sided hydronephrosis. Echogenicity seems relatively symmetric to the right. Bladder: Urinary catheter in position within the bladder IMPRESSION: Ultrasound demonstrates chronic bilateral hydronephrosis, unchanged from comparison CT and ultrasound. Increased echogenicity of the bilateral renal cortex, likely reflecting element of medical renal disease. Electronically Signed   By: Corrie Mckusick D.O.   On: 11/27/2018 10:33     Medications:   . sodium chloride 50 mL/hr at 11/27/18 1053  . cefTAZidime (FORTAZ)  IV Stopped (11/26/18 2353)   . amLODipine  2.5 mg Oral Daily  . vitamin C  250 mg Oral Daily   And  . ferrous sulfate  325 mg Oral Q breakfast  . finasteride  5 mg Oral Daily  . insulin aspart  0-5 Units Subcutaneous QHS  . insulin aspart  0-9 Units Subcutaneous TID WC  . metoprolol tartrate  25 mg Oral BID  . multivitamins with iron  1 tablet Oral Daily  . pantoprazole  40 mg Oral Daily  . rosuvastatin  5 mg Oral QHS  . sodium bicarbonate  650 mg Oral BID  . tamsulosin  0.4 mg Oral Daily   acetaminophen **OR** acetaminophen, guaiFENesin, ondansetron **OR** ondansetron (ZOFRAN) IV, polyethylene glycol, traZODone  Assessment/ Plan:  85 y.o. male with medical problems of high-grade bladder cancer and incompletely resected tumor, history of bilateral hydronephrosis, history of prostate cancer, recurrent UTIs, chronic bilateral hydro-nephrosis, atrial fibrillation, pacemaker/defibrillator in place who was admitted to [facility] on 11/21/2018 for evaluation of Worsening renal function.    Active Problems:   Acute renal failure (ARF) ([place])   #.  Acute renal failure on CKD st  4.  Baseline creatinine 3.86/GFR 15 from November 09, 2018 Differential for acute renal failure includes bladder outlet obstruction from Foley malfunction, and urinary tract infection Foley has been replaced.   -Renal function still remains diminished.  Creatinine currently 5.05.  Good urine output of 2 L over the preceding 24 hours.  Therefore no urgent indication for dialysis however continue to monitor renal parameters until downward trend noted.  Recent Labs    11/24/18 0419 11/25/18 0536 11/26/18 0443 11/27/18 0452  CREATININE 4.89* 4.63* 4.74* 5.05*    #. Anemia of CKD  Lab Results  Component Value Date   HGB 7.7 (L) 11/27/2018  Consider blood transfusion for hemoglobin of 7 or less.  #. SHPTH     Component Value Date/Time   PTH 103 (H) 11/24/2018 0419   Lab Results  Component Value Date   PHOS 5.1 (H) 11/27/2018  Hold off on binders at the moment, consider if phos rises to 5.5.    #. Diabetes type 2 with CKD Hemoglobin A1C (%)  Date Value  12/03/2012 7.1 (H)   Hgb A1c MFr Bld (%)  Date Value  11/22/2018 7.0 (H)   #Urinary tract infection -Pseudomonas aeruginosa Currently being treated with IV ceftazidime -Outpatient antibiotics as per pharmacy/internal medicine.   Patient's daughter Dr. Miquel Dunn, DDS [phone]   LOS: [place] 5/18/202011:42 [place], [place]

## 2018-11-27 NOTE — Progress Notes (Signed)
Daughter [name] called for update. Update given. Daughter requesting Dr. Posey Pronto call in regards to PT, Urology consult and discharge as she will need to arrange transportation. Cell # [phone]  Fuller Mandril, RN

## 2018-11-28 LAB — RENAL FUNCTION PANEL
Albumin: 2.6 g/dL — ABNORMAL LOW (ref 3.5–5.0)
Anion gap: 12 (ref 5–15)
BUN: 62 mg/dL — ABNORMAL HIGH (ref 8–23)
CO2: 19 mmol/L — ABNORMAL LOW (ref 22–32)
Calcium: 8.3 mg/dL — ABNORMAL LOW (ref 8.9–10.3)
Chloride: 116 mmol/L — ABNORMAL HIGH (ref 98–111)
Creatinine, Ser: 4.75 mg/dL — ABNORMAL HIGH (ref 0.61–1.24)
GFR calc Af Amer: 11 mL/min — ABNORMAL LOW (ref >60)
GFR calc non Af Amer: 10 mL/min — ABNORMAL LOW (ref >60)
Glucose, Bld: 117 mg/dL — ABNORMAL HIGH (ref 70–99)
Phosphorus: 4.9 mg/dL — ABNORMAL HIGH (ref 2.5–4.6)
Potassium: 4.4 mmol/L (ref 3.5–5.1)
Sodium: 147 mmol/L — ABNORMAL HIGH (ref 135–145)

## 2018-11-28 LAB — GLUCOSE, CAPILLARY
Glucose-Capillary: 123 mg/dL — ABNORMAL HIGH (ref 70–99)
Glucose-Capillary: 145 mg/dL — ABNORMAL HIGH (ref 70–99)
Glucose-Capillary: 140 mg/dL — ABNORMAL HIGH (ref 70–99)
Glucose-Capillary: 144 mg/dL — ABNORMAL HIGH (ref 70–99)

## 2018-11-28 LAB — HEMOGLOBIN AND HEMATOCRIT, BLOOD
HCT: 29 % — ABNORMAL LOW (ref 39.0–52.0)
Hemoglobin: 8.8 g/dL — ABNORMAL LOW (ref 13.0–17.0)

## 2018-11-28 MED ORDER — SODIUM CHLORIDE 0.9% FLUSH: 10.0000 mL | INTRAVENOUS | Status: DC | PRN

## 2018-11-28 MED ORDER — SODIUM CHLORIDE 0.9% FLUSH
10.0000 mL | Freq: Two times a day (BID) | INTRAVENOUS | Status: DC
  Administered 2018-11-28 – 2018-11-29 (×2): 10 mL

## 2018-11-28 MED ORDER — SODIUM CHLORIDE 0.45 % IV SOLN
INTRAVENOUS | Status: DC
  Administered 2018-11-28 – 2018-11-29 (×2): via INTRAVENOUS

## 2018-11-28 NOTE — Progress Notes (Signed)
[facility], [place] 11/28/18  Subjective:  Patient seen at bedside.  He is a bit lethargic but arousable. Creatinine down to 4.8. Urine output was 2.1 L over the preceding 24 hours.    Objective:  Vital signs in last 24 hours:  Temp:  [98.2 F (36.8 C)-98.4 F (36.9 C)] 98.4 F (36.9 C) (05/19 0628) Pulse Rate:  [69-90] 90 (05/19 0628) Resp:  [17-20] 20 (05/19 0628) BP: (150-156)/(85-95) 150/95 (05/19 0628) SpO2:  [93 %-98 %] 93 % (05/19 0628)  Weight change:  Filed Weights   11/21/18 1828 11/21/18 2334  Weight: 82.6 kg 99.2 kg    Intake/Output:    Intake/Output Summary (Last 24 hours) at 11/28/2018 1120 Last data filed at 11/28/2018 1000 Gross per 24 hour  Intake 1714.96 ml  Output 2400 ml  Net -685.04 ml    General:  Frail, elderly gentleman, laying in the bed  HEENT  anicteric, moist oral mucous membranes  Neck:  Supple  Lungs:  Normal breathing effort, clear to auscultation  Heart::  Irregular rhythm, prominent systolic murmur  Abdomen:  Soft, nontender  Extremities:  +Peripheral edema  Neurologic:   Lethargic but arousable  Skin:  No acute rashes  Foley:  Catheter present with clear yellow urine    Basic Metabolic Panel:  Recent Labs  Lab 11/24/18 0419 11/25/18 0536 11/26/18 0443 11/27/18 0452 11/28/18 0312  NA 142 145 143 146* 147*  K 4.1 4.3 4.3 4.5 4.4  CL 111 114* 112* 116* 116*  CO2 20* 19* 21* 20* 19*  GLUCOSE 115* 97 114* 105* 117*  BUN 69* 65* 67* 65* 62*  CREATININE 4.89* 4.63* 4.74* 5.05* 4.75*  CALCIUM 7.9* 8.3* 8.3* 8.2* 8.3*  PHOS 4.7* 4.7* 4.9* 5.1* 4.9*     CBC: Recent Labs  Lab 11/21/18 1948 11/22/18 0334  11/24/18 0419 11/25/18 0536 11/26/18 0443 11/27/18 0452 11/28/18 0312  WBC 11.0* 10.7*  --   --   --  6.6  --   --   NEUTROABS 9.4*  --   --   --   --   --   --   --   HGB 10.1* 8.4*   < > 7.8* 8.1* 7.9* 7.7* 8.8*  HCT 30.9* 26.3*   < > 25.0* 25.5* 25.1* 24.8* 29.0*  MCV 84.9 86.8   --   --   --  86.0  --   --   PLT 136* 120*  --   --   --  128*  --   --    < > = values in this interval not displayed.     No results found for: HEPBSAG, HEPBSAB, HEPBIGM    Microbiology:  Recent Results (from the past 240 hour(s))  CULTURE, URINE COMPREHENSIVE     Status: Abnormal   Collection Time: 11/21/18 12:57 PM  Result Value Ref Range Status   Urine Culture, Comprehensive Final report (A)  Final   Organism ID, Bacteria Comment (A)  Final    Comment: Pseudomonas aeruginosa Greater than 100,000 colony forming units per mL    ANTIMICROBIAL SUSCEPTIBILITY Comment  Final    Comment:       ** S = Susceptible; I = Intermediate; R = Resistant **                    P = Positive; N = Negative             MICS are expressed in micrograms per mL  Antibiotic                 RSLT#1    RSLT#2    RSLT#3    RSLT#4 Amikacin                       S Cefepime                       S Ceftazidime                    S Ciprofloxacin                  I Gentamicin                     S Imipenem                       S Levofloxacin                   I Meropenem                      S Piperacillin                   S Ticarcillin                    S Tobramycin                     S   Microscopic Examination     Status: Abnormal   Collection Time: 11/21/18 12:58 PM  Result Value Ref Range Status   WBC, UA >30 (A) 0 - 5 /hpf Final   RBC 3-10 (A) 0 - 2 /hpf Final   Epithelial Cells (non renal) None seen 0 - 10 /hpf Final   Bacteria, UA Moderate (A) None seen/Few Final  Urine culture     Status: Abnormal   Collection Time: 11/21/18  7:48 PM  Result Value Ref Range Status   Specimen Description   Final    URINE, RANDOM Performed at [facility], [address] [place], [place] [address]    Special Requests   Final    NONE Performed at [facility], [place], [place] [address]    Culture 80,000 COLONIES/mL PSEUDOMONAS AERUGINOSA (A)  Final   Report  Status 11/24/2018 FINAL  Final   Organism ID, Bacteria PSEUDOMONAS AERUGINOSA (A)  Final      Susceptibility   Pseudomonas aeruginosa - MIC*    CEFTAZIDIME 4 SENSITIVE Sensitive     CIPROFLOXACIN 2 INTERMEDIATE Intermediate     GENTAMICIN <=1 SENSITIVE Sensitive     IMIPENEM 2 SENSITIVE Sensitive     PIP/TAZO 8 SENSITIVE Sensitive     CEFEPIME 4 SENSITIVE Sensitive     * 80,000 COLONIES/mL PSEUDOMONAS AERUGINOSA  SARS Coronavirus 2 [facility] order, Performed in [place] hospital lab)     Status: None   Collection Time: 11/21/18  9:43 PM  Result Value Ref Range Status   SARS Coronavirus 2 NEGATIVE NEGATIVE Final    Comment: (NOTE) If result is NEGATIVE SARS-CoV-2 target nucleic acids are NOT DETECTED. The SARS-CoV-2 RNA is generally detectable in upper and lower  respiratory specimens during the acute phase of infection. The lowest  concentration of SARS-CoV-2 viral copies this assay can detect is 250  copies / mL. A negative  result does not preclude SARS-CoV-2 infection  and should not be used as the sole basis for treatment or other  patient management decisions.  A negative result may occur with  improper specimen collection / handling, submission of specimen other  than nasopharyngeal swab, presence of viral mutation(s) within the  areas targeted by this assay, and inadequate number of viral copies  (<250 copies / mL). A negative result must be combined with clinical  observations, patient history, and epidemiological information. If result is POSITIVE SARS-CoV-2 target nucleic acids are DETECTED. The SARS-CoV-2 RNA is generally detectable in upper and lower  respiratory specimens dur ing the acute phase of infection.  Positive  results are indicative of active infection with SARS-CoV-2.  Clinical  correlation with patient history and other diagnostic information is  necessary to determine patient infection status.  Positive results do  not rule out bacterial infection or  co-infection with other viruses. If result is PRESUMPTIVE POSTIVE SARS-CoV-2 nucleic acids MAY BE PRESENT.   A presumptive positive result was obtained on the submitted specimen  and confirmed on repeat testing.  While 2019 novel coronavirus  (SARS-CoV-2) nucleic acids may be present in the submitted sample  additional confirmatory testing may be necessary for epidemiological  and / or clinical management purposes  to differentiate between  SARS-CoV-2 and other Sarbecovirus currently known to infect humans.  If clinically indicated additional testing with an alternate test  methodology [phone]) is advised. The SARS-CoV-2 RNA is generally  detectable in upper and lower respiratory sp ecimens during the acute  phase of infection. The expected result is Negative. Fact Sheet for Patients:  StrictlyIdeas.no Fact Sheet for Healthcare Providers: BankingDealers.co.za This test is not yet approved or cleared by the [place] FDA and has been authorized for detection and/or diagnosis of SARS-CoV-2 by FDA under an Emergency Use Authorization (EUA).  This EUA will remain in effect (meaning this test can be used) for the duration of the COVID-19 declaration under Section 564(b)(1) of the Act, 21 U.S.C. section 360bbb-3(b)(1), unless the authorization is terminated or revoked sooner. Performed at [facility], [address] [place], [place] [address]     Coagulation Studies: No results for input(s): LABPROT, INR in the last 72 hours.  Urinalysis: No results for input(s): COLORURINE, LABSPEC, PHURINE, GLUCOSEU, HGBUR, BILIRUBINUR, KETONESUR, PROTEINUR, UROBILINOGEN, NITRITE, LEUKOCYTESUR in the last 72 hours.  Invalid input(s): APPERANCEUR    Imaging: US Renal  Result Date: 11/27/2018 CLINICAL DATA:  85 year old male with a history of acute renal failure EXAM: RENAL / URINARY TRACT ULTRASOUND COMPLETE COMPARISON:  CT  11/06/2018, ultrasound 11/06/2018 FINDINGS: Right Kidney: Length: 12.8 cm x 6.5 cm x 7.3 cm, 318 cc. Redemonstration of cortical thinning with hydronephrosis which was present on prior ultrasound and CT. Echogenicity of the right kidney cortex appears increased from the internal standard of the liver. Left Kidney: Length: 13.9 cm x 7.2 cm x 7.9 cm, 413 cc. Redemonstration of left-sided hydronephrosis. Echogenicity seems relatively symmetric to the right. Bladder: Urinary catheter in position within the bladder IMPRESSION: Ultrasound demonstrates chronic bilateral hydronephrosis, unchanged from comparison CT and ultrasound. Increased echogenicity of the bilateral renal cortex, likely reflecting element of medical renal disease. Electronically Signed   By: Corrie Mckusick D.O.   On: 11/27/2018 10:33     Medications:   . sodium chloride    . cefTAZidime (FORTAZ)  IV Stopped (11/27/18 2230)   . amLODipine  2.5 mg Oral Daily  . vitamin C  250 mg Oral  Daily   And  . ferrous sulfate  325 mg Oral Q breakfast  . finasteride  5 mg Oral Daily  . insulin aspart  0-5 Units Subcutaneous QHS  . insulin aspart  0-9 Units Subcutaneous TID WC  . metoprolol tartrate  25 mg Oral BID  . multivitamins with iron  1 tablet Oral Daily  . pantoprazole  40 mg Oral Daily  . rosuvastatin  5 mg Oral QHS  . sodium bicarbonate  650 mg Oral BID  . tamsulosin  0.4 mg Oral Daily   acetaminophen **OR** acetaminophen, guaiFENesin, ondansetron **OR** ondansetron (ZOFRAN) IV, polyethylene glycol, traZODone  Assessment/ Plan:  85 y.o. male with medical problems of high-grade bladder cancer and incompletely resected tumor, history of bilateral hydronephrosis, history of prostate cancer, recurrent UTIs, chronic bilateral hydro-nephrosis, atrial fibrillation, pacemaker/defibrillator in place who was admitted to [facility] on 11/21/2018 for evaluation of Worsening renal function.    Active Problems:   Acute renal failure (ARF)  ([place])   #.  Acute renal failure on CKD st 4.  Baseline creatinine 3.86/GFR 15 from November 09, 2018 Differential for acute renal failure includes bladder outlet obstruction from Foley malfunction, and urinary tract infection Foley has been replaced.   -Renal function has improved.  Creatinine currently 4.75 with urine output of 2.4 L over the preceding 24 hours.  Appreciate urology input.  For now Foley catheter to be left in place.  Recent Labs    11/25/18 0536 11/26/18 0443 11/27/18 0452 11/28/18 0312  CREATININE 4.63* 4.74* 5.05* 4.75*    #. Anemia of CKD  Lab Results  Component Value Date   HGB 8.8 (L) 11/28/2018  Hemoglobin currently 8.8.  Hold off on Epogen given underlying malignancy.  #. SHPTH     Component Value Date/Time   PTH 103 (H) 11/24/2018 0419   Lab Results  Component Value Date   PHOS 4.9 (H) 11/28/2018  PTH currently separable for stage of chronic kidney disease.  Phosphorus a bit high at 4.9.  Consider phosphorus binders as an outpatient.   #. Diabetes type 2 with CKD Hemoglobin A1C (%)  Date Value  12/03/2012 7.1 (H)   Hgb A1c MFr Bld (%)  Date Value  11/22/2018 7.0 (H)   #Urinary tract infection -Pseudomonas aeruginosa Currently being treated with IV ceftazidime -Outpatient antibiotics as per pharmacy/internal medicine.   Patient's daughter Dr. Miquel Dunn, DDS [phone]   LOS: [place] 5/19/202011:20 [place], [place]

## 2018-11-28 NOTE — Progress Notes (Signed)
[place] [facility] [place] NAME: Blake [name]    MR#:  [id number]  DATE OF BIRTH:  [birth year]    CHIEF COMPLAINT:   Chief Complaint  Patient presents with  . Abnormal Lab    Patient currently comfortable denying any symptoms  REVIEW OF SYSTEMS:   Review of Systems  Constitutional: Negative for chills and fever.  HENT: Negative for hearing loss.   Eyes: Negative for blurred vision, double vision and photophobia.  Respiratory: Negative for cough, hemoptysis and shortness of breath.   Cardiovascular: Negative for palpitations, orthopnea and leg swelling.  Gastrointestinal: Negative for abdominal pain, diarrhea and vomiting.  Genitourinary: Negative for dysuria and urgency.  Musculoskeletal: Negative for myalgias and neck pain.  Skin: Negative for rash.  Neurological: Negative for dizziness, focal weakness, seizures, weakness and headaches.  Psychiatric/Behavioral: Negative for memory loss. The patient does not have insomnia.     DRUG ALLERGIES:   Allergies  Allergen Reactions  . Diovan [Valsartan] Cough  . Gabapentin Other (See Comments)    Sedation at all doses  . Hydralazine Itching  . Lisinopril Cough  . Pregabalin Other (See Comments)    Sedation at all doses  . Levofloxacin Other (See Comments)    Too many side effects. Nausea, vomiting, upset stomach, increased confusion, etc Other reaction(s): Confusion Nausea, chills Nausea, chills   . Sulfamethoxazole-Trimethoprim Other (See Comments)    Too many side effects. Nausea, vomiting, upset stomach, increased confusion, etc    VITALS:  Blood pressure (!) 147/89, pulse 92, temperature 97.6 F (36.4 C), temperature source Oral, resp. rate 16, height 6\' 2"  (1.88 m), weight 99.2 kg, SpO2 96 %.  PHYSICAL EXAMINATION:  GENERAL:  85 y.o.-year-old patient lying in the bed with no acute distress.  EYES: Pupils equal, round, reactive to lightNo scleral icterus. Extraocular  muscles intact.  HEENT: Head atraumatic, normocephalic. Oropharynx and nasopharynx clear.  NECK:  Supple, no jugular venous distention. No thyroid enlargement, no tenderness.  LUNGS: Normal breath sounds bilaterally, no wheezing, rales,rhonchi or crepitation. No use of accessory muscles of respiration.  CARDIOVASCULAR: S1, S2 normal. No murmurs, rubs, or gallops.  ABDOMEN: Soft, nontender, nondistended. Bowel sounds present. No organomegaly or mass.  EXTREMITIES: No pedal edema, cyanosis, or clubbing.  NEUROLOGIC:, Awake, Oriented.  PSYCHIATRIC alert, awake, oriented.Marland Kitchen  SKIN: No obvious rash, lesion, or ulcer.    LABORATORY PANEL:   CBC Recent Labs  Lab 11/26/18 0443  11/28/18 0312  WBC 6.6  --   --   HGB 7.9*   < > 8.8*  HCT 25.1*   < > 29.0*  PLT 128*  --   --    < > = values in this interval not displayed.   ------------------------------------------------------------------------------------------------------------------  Chemistries  Recent Labs  Lab 11/21/18 1948  11/28/18 0312  NA 131*   < > 147*  K 4.6   < > 4.4  CL 99   < > 116*  CO2 20*   < > 19*  GLUCOSE 242*   < > 117*  BUN 78*   < > 62*  CREATININE 5.54*   < > 4.75*  CALCIUM 8.1*   < > 8.3*  AST 34  --   --   ALT 32  --   --   ALKPHOS 82  --   --   BILITOT 0.4  --   --    < > = values in this interval not displayed.   ------------------------------------------------------------------------------------------------------------------  Cardiac Enzymes No results for input(s): TROPONINI in the last 168 hours. ------------------------------------------------------------------------------------------------------------------  RADIOLOGY:  US Renal  Result Date: 11/27/2018 CLINICAL DATA:  85 year old male with a history of acute renal failure EXAM: RENAL / URINARY TRACT ULTRASOUND COMPLETE COMPARISON:  CT 11/06/2018, ultrasound 11/06/2018 FINDINGS: Right Kidney: Length: 12.8 cm x 6.5 cm x 7.3 cm, 318 cc.  Redemonstration of cortical thinning with hydronephrosis which was present on prior ultrasound and CT. Echogenicity of the right kidney cortex appears increased from the internal standard of the liver. Left Kidney: Length: 13.9 cm x 7.2 cm x 7.9 cm, 413 cc. Redemonstration of left-sided hydronephrosis. Echogenicity seems relatively symmetric to the right. Bladder: Urinary catheter in position within the bladder IMPRESSION: Ultrasound demonstrates chronic bilateral hydronephrosis, unchanged from comparison CT and ultrasound. Increased echogenicity of the bilateral renal cortex, likely reflecting element of medical renal disease. Electronically Signed   By: Corrie Mckusick D.O.   On: 11/27/2018 10:33    EKG:   Orders placed or performed during the hospital encounter of 11/21/18  . EKG 12-Lead  . EKG 12-Lead    ASSESSMENT AND PLAN:   #1 acute on chronic renal failure, chronic kidney disease stage IV: Chronic bilateral hydronephrosis, status post Foley change, renal function has worsened I ordered a renal ultrasound which shows chronic hydronephrosis Patient was seen by urology yesterday they discussed options with the family family states that they want conservative treatment for now   #2.  Sepsis with Pseudomonas ; continue Tressie Ellis, will treat with Invanz IM on discharge . #3 metabolic encephalopathy due to UTI sepsis and renal failure, improving.    #4 hypertension continue Lopressor  #5  Hyperlipidemia continue Crestor    CODE STATUS: Full code TOTAL TIME TAKING CARE OF THIS PATIENT: 96minutes.   POSSIBLE D/C IN 2-3 DAYS, DEPENDING ON CLINICAL CONDITION.   Dustin Flock M.D on 11/28/2018 at 12:02 PM  Between 7am to 6pm - Pager - [phone]  After 6pm go to [email or web address] - password [email or web address] [facility]  Office  [phone]  CC: Primary care physician; Adin Hector, MD   Note: This dictation was prepared with Dragon dictation along with smaller phrase  technology. Any transcriptional errors that result from this process are unintentional.

## 2018-11-28 NOTE — Progress Notes (Signed)
Creatinine improved today at 4.75.  Continue Foley catheter drainage as per Dr. Doristine Counter consult note of 11/27/2018

## 2018-11-28 NOTE — Progress Notes (Signed)
PHARMACY CONSULT NOTE FOR:  OUTPATIENT  PARENTERAL ANTIBIOTIC THERAPY (OPAT)  Indication: pseudomonas UTI Regimen: ceftazidime 1gm q24h End date: 12/03/2018  IV antibiotic discharge orders are pended. To discharging provider:  please sign these orders via discharge navigator,  Select New Orders & click on the button choice - Manage This Unsigned Work.     Thank you for allowing pharmacy to be a part of this patient's care.  Doreene Eland, PharmD, BCPS.   Work Cell: [phone] 11/28/2018 4:24 PM

## 2018-11-28 NOTE — Evaluation (Signed)
Physical Therapy Evaluation Patient Details Name: Blake [name] MRN: [id number] DOB: Feb 05, 1923 Today's Date: 11/28/2018   History of Present Illness  85yo male who was recently here with AMS after a 10-15 minute shaking episode at home, also an episode of emesis. PMH: HTN, HLD, DM2, PrCA, bladderCA c frequent UTI.  Now here with acute renal failure.  Clinical Impression  Pt did reasonably well with PT but clearly lacked confidence with standing and though we shortened upright/ambulation tasks secondary to BM/need for clean up it appears he was likely walking better during recent hospitalization where he was able to walk 50 ft (not likely able to do that today even if we were able to try).  Unsure as to his actual baseline, but he needed assist this date with mobility, transfer to standing and he did not appear nearly as confident with walking.  Pt open to the idea of rehab if needed and this the PT recommendation at this time, he does endorse feeling weaker than his normal.     Follow Up Recommendations Supervision/Assistance - 24 hour;SNF(per family ability to assist/[facility] HHPT could manageable?)    Equipment Recommendations  None recommended by PT    Recommendations for Other Services       Precautions / Restrictions Precautions Precautions: Fall Restrictions Weight Bearing Restrictions: No      Mobility  Bed Mobility Overal bed mobility: Needs Assistance Bed Mobility: Supine to Sit;Sit to Supine     Supine to sit: Min assist Sit to supine: Min assist   General bed mobility comments: Pt showed good effort, able to do most of both sit and back to supine, but did need assist with each transition  Transfers Overall transfer level: Needs assistance Equipment used: Rolling walker (2 wheeled) Transfers: Sit to/from Stand Sit to Stand: Min assist;From elevated surface         General transfer comment: Pt unable to rise from standard height RW and min assist, was able to  attain standing with bed raised ~3"  Ambulation/Gait Ambulation/Gait assistance: Min guard Gait Distance (Feet): 5 Feet Assistive device: Rolling walker (2 wheeled)       General Gait Details: Pt was able to take a few small turning steps to get to recliner, however we realized he had some stool that needed cleaned and we turned back 90 deg to the bed.  Stairs            Wheelchair Mobility    Modified Rankin (Stroke Patients Only)       Balance Overall balance assessment: Needs assistance Sitting-balance support: No upper extremity supported;Feet supported Sitting balance-Leahy Scale: Fair     Standing balance support: During functional activity Standing balance-Leahy Scale: Fair Standing balance comment: highly reliant on the walker, did not have LOBs but displayed general unsteadiness                             Pertinent Vitals/Pain Pain Assessment: No/denies pain    Home Living Family/patient expects to be discharged to:: Private residence Living Arrangements: Spouse/significant other;Children Available Help at Discharge: Family;Available 24 hours/day;Personal care attendant Type of Home: House Home Access: Ramped entrance     Home Layout: Multi-level;Able to live on main level with bedroom/bathroom Home Equipment: Walker - 4 wheels;Cane - single point;Bedside commode;Shower seat - built in;Wheelchair - manual      Prior Function Level of Independence: Needs assistance   Gait / Transfers Assistance Needed: limited household ambulator  with Rollator  ADL's / Homemaking Assistance Needed: reports independent with dressing, toiletting, bathing unless he's in a hurry        Hand Dominance        Extremity/Trunk Assessment   Upper Extremity Assessment Upper Extremity Assessment: Generalized weakness    Lower Extremity Assessment Lower Extremity Assessment: Generalized weakness       Communication   Communication: No difficulties   Cognition Arousal/Alertness: Awake/alert Behavior During Therapy: WFL for tasks assessed/performed Overall Cognitive Status: Within Functional Limits for tasks assessed                                 General Comments: struggled with date, knew his birthday was recent but thought it was July even with cuing      General Comments      Exercises     Assessment/Plan    PT Assessment Patient needs continued PT services  PT Problem List Decreased strength;Decreased mobility;Decreased activity tolerance;Decreased cognition       PT Treatment Interventions DME instruction;Gait training;Therapeutic activities;Functional mobility training;Patient/family education    PT Goals (Current goals can be found in the Care Plan section)  Acute Rehab PT Goals Patient Stated Goal: return to home, regain strength  PT Goal Formulation: With patient Time For Goal Achievement: 12/12/18 Potential to Achieve Goals: Fair    Frequency Min 2X/week   Barriers to discharge        Co-evaluation               AM-PAC PT "6 Clicks" Mobility  Outcome Measure Help needed turning from your back to your side while in a flat bed without using bedrails?: A Little Help needed moving from lying on your back to sitting on the side of a flat bed without using bedrails?: A Lot Help needed moving to and from a bed to a chair (including a wheelchair)?: A Little Help needed standing up from a chair using your arms (e.g., wheelchair or bedside chair)?: A Lot Help needed to walk in hospital room?: A Lot Help needed climbing 3-5 steps with a railing? : Total 6 Click Score: 13    End of Session Equipment Utilized During Treatment: Gait belt Activity Tolerance: Patient tolerated treatment well;Patient limited by fatigue Patient left: with chair alarm set;with call bell/phone within reach Nurse Communication: Mobility status(need for clean up) PT Visit Diagnosis: Muscle weakness (generalized)  (M62.81);Difficulty in walking, not elsewhere classified (R26.2);Other abnormalities of gait and mobility (R26.89)    Time: 8264-1583 PT Time Calculation (min) (ACUTE ONLY): 32 min   Charges:   PT Evaluation $PT Eval Low Complexity: 1 Low PT Treatments $Therapeutic Activity: 8-22 mins        Kreg Shropshire, DPT 11/28/2018, 1:47 PM

## 2018-11-28 NOTE — TOC Progression Note (Signed)
Transition of Care [facility]) - Progression Note    Patient Details  Name: Blake [name] MRN: [id number] Date of Birth: [birth year]  Transition of Care [facility]) CM/SW Contact  Beverly Sessions, RN Phone Number: 11/28/2018, 4:43 PM  Clinical Narrative:    PT has assessed patient and recommends SNF.  RNCM spoke with both daughter and son via speaker phone.  They are both in agreement for patient to return home with his daughter.  Patient already has 24 hours care in place at the home.    Initaily the plan was for patient to discharge with daily IM antibiotics thru 5/25.  Plan now is for patient to discharge with home IV antibiotics tomorrow.  Midline to be placed.  Family in agreement to plan.  Corene Cornea with [facility] notified.  Pam with [facility] notified of referral    Expected Discharge Plan: [place] Barriers to Discharge: Continued Medical Work up  Expected Discharge Plan and Services Expected Discharge Plan: [place]   Discharge Planning Services: CM Consult   Living arrangements for the past 2 months: Single Family Home                           HH Arranged: RN, PT, Social Work [facility] Agency: [place] ([employer] Date [place]: 11/22/18 Time [place]: 1223 Representative spoke with at [place]: [place] ([place]) Interventions    Readmission Risk Interventions Readmission Risk Prevention Plan 11/24/2018 11/09/2018 11/06/2018  Transportation Screening Complete Complete Complete  PCP or Specialist Appt within 3-5 Days - Complete -  HRI or Home Care Consult Complete Complete -  Palliative Care Screening Not Applicable - -  Medication Review (RN Care Manager) Complete Complete -  Some recent data might be hidden

## 2018-11-29 LAB — GLUCOSE, CAPILLARY
Glucose-Capillary: 119 mg/dL — ABNORMAL HIGH (ref 70–99)
Glucose-Capillary: 166 mg/dL — ABNORMAL HIGH (ref 70–99)

## 2018-11-29 LAB — RENAL FUNCTION PANEL
Albumin: 2.5 g/dL — ABNORMAL LOW (ref 3.5–5.0)
Anion gap: 11 (ref 5–15)
BUN: 63 mg/dL — ABNORMAL HIGH (ref 8–23)
CO2: 19 mmol/L — ABNORMAL LOW (ref 22–32)
Calcium: 8.4 mg/dL — ABNORMAL LOW (ref 8.9–10.3)
Chloride: 117 mmol/L — ABNORMAL HIGH (ref 98–111)
Creatinine, Ser: 5.09 mg/dL — ABNORMAL HIGH (ref 0.61–1.24)
GFR calc Af Amer: 10 mL/min — ABNORMAL LOW (ref >60)
GFR calc non Af Amer: 9 mL/min — ABNORMAL LOW (ref >60)
Glucose, Bld: 130 mg/dL — ABNORMAL HIGH (ref 70–99)
Phosphorus: 4.3 mg/dL (ref 2.5–4.6)
Potassium: 4.1 mmol/L (ref 3.5–5.1)
Sodium: 147 mmol/L — ABNORMAL HIGH (ref 135–145)

## 2018-11-29 LAB — HEMOGLOBIN AND HEMATOCRIT, BLOOD
HCT: 25.9 % — ABNORMAL LOW (ref 39.0–52.0)
Hemoglobin: 8 g/dL — ABNORMAL LOW (ref 13.0–17.0)

## 2018-11-29 MED ORDER — CEFTAZIDIME IV (FOR PTA / DISCHARGE USE ONLY): 1.0000 g | INTRAVENOUS | 0 refills | Status: DC

## 2018-11-29 NOTE — Progress Notes (Signed)
[facility], [place] 11/29/18  Subjective:  Creatinine continues to fluctuate a bit. Creatinine today was found to be 5.09. Patient had urine output of 2.8 L over the preceding 24 hours.    Objective:  Vital signs in last 24 hours:  Temp:  [98.5 F (36.9 C)-98.7 F (37.1 C)] 98.6 F (37 C) (05/20 0800) Pulse Rate:  [87-112] 88 (05/20 0800) Resp:  [18-20] 19 (05/20 0800) BP: (138-150)/(93-99) 150/99 (05/20 0800) SpO2:  [93 %-98 %] 97 % (05/20 0800)  Weight change:  Filed Weights   11/21/18 1828 11/21/18 2334  Weight: 82.6 kg 99.2 kg    Intake/Output:    Intake/Output Summary (Last 24 hours) at 11/29/2018 1151 Last data filed at 11/29/2018 1165 Gross per 24 hour  Intake 1498.78 ml  Output 3125 ml  Net -1626.22 ml    General:  Frail, elderly gentleman, laying in the bed  HEENT  anicteric, moist oral mucous membranes  Neck:  Supple  Lungs:  Normal breathing effort, clear to auscultation  Heart::  Irregular rhythm, prominent systolic murmur  Abdomen:  Soft, nontender  Extremities:  +Peripheral edema  Neurologic:   Awake, alert, conversant  Skin:  No acute rashes  Foley:  Catheter present with clear yellow urine    Basic Metabolic Panel:  Recent Labs  Lab 11/25/18 0536 11/26/18 0443 11/27/18 0452 11/28/18 0312 11/29/18 0500  NA 145 143 146* 147* 147*  K 4.3 4.3 4.5 4.4 4.1  CL 114* 112* 116* 116* 117*  CO2 19* 21* 20* 19* 19*  GLUCOSE 97 114* 105* 117* 130*  BUN 65* 67* 65* 62* 63*  CREATININE 4.63* 4.74* 5.05* 4.75* 5.09*  CALCIUM 8.3* 8.3* 8.2* 8.3* 8.4*  PHOS 4.7* 4.9* 5.1* 4.9* 4.3     CBC: Recent Labs  Lab 11/25/18 0536 11/26/18 0443 11/27/18 0452 11/28/18 0312 11/29/18 0500  WBC  --  6.6  --   --   --   HGB 8.1* 7.9* 7.7* 8.8* 8.0*  HCT 25.5* 25.1* 24.8* 29.0* 25.9*  MCV  --  86.0  --   --   --   PLT  --  128*  --   --   --      No results found for: HEPBSAG, HEPBSAB, HEPBIGM    Microbiology:  Recent  Results (from the past 240 hour(s))  CULTURE, URINE COMPREHENSIVE     Status: Abnormal   Collection Time: 11/21/18 12:57 PM  Result Value Ref Range Status   Urine Culture, Comprehensive Final report (A)  Final   Organism ID, Bacteria Comment (A)  Final    Comment: Pseudomonas aeruginosa Greater than 100,000 colony forming units per mL    ANTIMICROBIAL SUSCEPTIBILITY Comment  Final    Comment:       ** S = Susceptible; I = Intermediate; R = Resistant **                    P = Positive; N = Negative             MICS are expressed in micrograms per mL    Antibiotic                 RSLT#1    RSLT#2    RSLT#3    RSLT#4 Amikacin                       S Cefepime  S Ceftazidime                    S Ciprofloxacin                  I Gentamicin                     S Imipenem                       S Levofloxacin                   I Meropenem                      S Piperacillin                   S Ticarcillin                    S Tobramycin                     S   Microscopic Examination     Status: Abnormal   Collection Time: 11/21/18 12:58 PM  Result Value Ref Range Status   WBC, UA >30 (A) 0 - 5 /hpf Final   RBC 3-10 (A) 0 - 2 /hpf Final   Epithelial Cells (non renal) None seen 0 - 10 /hpf Final   Bacteria, UA Moderate (A) None seen/Few Final  Urine culture     Status: Abnormal   Collection Time: 11/21/18  7:48 PM  Result Value Ref Range Status   Specimen Description   Final    URINE, RANDOM Performed at [facility], [address], [place], [place] [address]    Special Requests   Final    NONE Performed at [facility], [place],  [address]    Culture 80,000 COLONIES/mL PSEUDOMONAS AERUGINOSA (A)  Final   Report Status 11/24/2018 FINAL  Final   Organism ID, Bacteria PSEUDOMONAS AERUGINOSA (A)  Final      Susceptibility   Pseudomonas aeruginosa - MIC*    CEFTAZIDIME 4 SENSITIVE Sensitive     CIPROFLOXACIN 2  INTERMEDIATE Intermediate     GENTAMICIN <=1 SENSITIVE Sensitive     IMIPENEM 2 SENSITIVE Sensitive     PIP/TAZO 8 SENSITIVE Sensitive     CEFEPIME 4 SENSITIVE Sensitive     * 80,000 COLONIES/mL PSEUDOMONAS AERUGINOSA  SARS Coronavirus 2 [facility] order, Performed in [place] hospital lab)     Status: None   Collection Time: 11/21/18  9:43 PM  Result Value Ref Range Status   SARS Coronavirus 2 NEGATIVE NEGATIVE Final    Comment: (NOTE) If result is NEGATIVE SARS-CoV-2 target nucleic acids are NOT DETECTED. The SARS-CoV-2 RNA is generally detectable in upper and lower  respiratory specimens during the acute phase of infection. The lowest  concentration of SARS-CoV-2 viral copies this assay can detect is 250  copies / mL. A negative result does not preclude SARS-CoV-2 infection  and should not be used as the sole basis for treatment or other  patient management decisions.  A negative result may occur with  improper specimen collection / handling, submission of specimen other  than nasopharyngeal swab, presence of viral mutation(s) within the  areas targeted by this assay, and inadequate number of viral copies  (<250 copies / mL). A negative result must be combined with clinical  observations, patient history, and epidemiological information. If result is POSITIVE SARS-CoV-2 target nucleic acids are DETECTED. The SARS-CoV-2 RNA is generally detectable in upper and lower  respiratory specimens dur ing the acute phase of infection.  Positive  results are indicative of active infection with SARS-CoV-2.  Clinical  correlation with patient history and other diagnostic information is  necessary to determine patient infection status.  Positive results do  not rule out bacterial infection or co-infection with other viruses. If result is PRESUMPTIVE POSTIVE SARS-CoV-2 nucleic acids MAY BE PRESENT.   A presumptive positive result was obtained on the submitted specimen  and confirmed on  repeat testing.  While 2019 novel coronavirus  (SARS-CoV-2) nucleic acids may be present in the submitted sample  additional confirmatory testing may be necessary for epidemiological  and / or clinical management purposes  to differentiate between  SARS-CoV-2 and other Sarbecovirus currently known to infect humans.  If clinically indicated additional testing with an alternate test  methodology [phone]) is advised. The SARS-CoV-2 RNA is generally  detectable in upper and lower respiratory sp ecimens during the acute  phase of infection. The expected result is Negative. Fact Sheet for Patients:  StrictlyIdeas.no Fact Sheet for Healthcare Providers: BankingDealers.co.za This test is not yet approved or cleared by the [place] FDA and has been authorized for detection and/or diagnosis of SARS-CoV-2 by FDA under an Emergency Use Authorization (EUA).  This EUA will remain in effect (meaning this test can be used) for the duration of the COVID-19 declaration under Section 564(b)(1) of the Act, 21 U.S.C. section 360bbb-3(b)(1), unless the authorization is terminated or revoked sooner. Performed at [facility], [address] [place], [place] [address]     Coagulation Studies: No results for input(s): LABPROT, INR in the last 72 hours.  Urinalysis: No results for input(s): COLORURINE, LABSPEC, PHURINE, GLUCOSEU, HGBUR, BILIRUBINUR, KETONESUR, PROTEINUR, UROBILINOGEN, NITRITE, LEUKOCYTESUR in the last 72 hours.  Invalid input(s): APPERANCEUR    Imaging: No results found.   Medications:   . cefTAZidime (FORTAZ)  IV Stopped (11/28/18 1811)   . amLODipine  2.5 mg Oral Daily  . vitamin C  250 mg Oral Daily   And  . ferrous sulfate  325 mg Oral Q breakfast  . finasteride  5 mg Oral Daily  . insulin aspart  0-5 Units Subcutaneous QHS  . insulin aspart  0-9 Units Subcutaneous TID WC  . metoprolol tartrate  25 mg Oral BID   . multivitamins with iron  1 tablet Oral Daily  . pantoprazole  40 mg Oral Daily  . rosuvastatin  5 mg Oral QHS  . sodium bicarbonate  650 mg Oral BID  . sodium chloride flush  10-40 mL Intracatheter Q12H  . tamsulosin  0.4 mg Oral Daily   acetaminophen **OR** acetaminophen, guaiFENesin, ondansetron **OR** ondansetron (ZOFRAN) IV, polyethylene glycol, sodium chloride flush, traZODone  Assessment/ Plan:  85 y.o. male with medical problems of high-grade bladder cancer and incompletely resected tumor, history of bilateral hydronephrosis, history of prostate cancer, recurrent UTIs, chronic bilateral hydro-nephrosis, atrial fibrillation, pacemaker/defibrillator in place who was admitted to [facility] on 11/21/2018 for evaluation of Worsening renal function.    Active Problems:   Acute renal failure (ARF) ([place])   #.  Acute renal failure on CKD st 4.  Baseline creatinine 3.86/GFR 15 from November 09, 2018 Differential for acute renal failure includes bladder outlet obstruction from Foley malfunction, and urinary tract infection Foley has been replaced.   -Kidney function continues to fluctuate.  Creatinine currently 5.09 with an EGFR of 10.  Good urine output of 2.8 L however.  Patient will need close monitoring of renal parameters as an outpatient.  Therefore we will set up follow-up with Dr. Candiss Norse.  Recent Labs    11/26/18 0443 11/27/18 0452 11/28/18 0312 11/29/18 0500  CREATININE 4.74* 5.05* 4.75* 5.09*    #. Anemia of CKD  Lab Results  Component Value Date   HGB 8.0 (L) 11/29/2018  Hemoglobin down a bit to 8.0.  Hold off on Epogen given underlying malignancy.  #. SHPTH     Component Value Date/Time   PTH 103 (H) 11/24/2018 0419   Lab Results  Component Value Date   PHOS 4.3 11/29/2018  Phosphorus remains at target at 4.3.  Continue to monitor in the outpatient setting.   #. Diabetes type 2 with CKD Hemoglobin A1C (%)  Date Value  12/03/2012 7.1 (H)   Hgb A1c MFr Bld (%)   Date Value  11/22/2018 7.0 (H)   #Urinary tract infection -Pseudomonas aeruginosa Currently being treated with IV ceftazidime -Outpatient antibiotics as per pharmacy/internal medicine.   Patient's daughter Dr. Miquel Dunn, DDS [phone]   LOS: [place] 5/20/202011:51 AM  [facility] [place], [place]

## 2018-11-29 NOTE — Progress Notes (Signed)
MD notified: Juluis Rainier, there are some small blood clots found in the foley catheter tubing.

## 2018-11-29 NOTE — TOC Transition Note (Signed)
Transition of Care [facility]) - CM/SW Discharge Note   Patient Details  Name: Blake [name] MRN: [id number] Date of Birth: 1922-09-06  [facility] of [facility] CM/SW Contact:  Shela Leff, LCSW Phone Number: 11/29/2018, 1:52 PM   Clinical Narrative:   Patient discharging today to home as planned, with services that were arranged prior. CSW notified Corene Cornea with [facility] and notified Pam with [facility] infusion.     Final next level of care: [place] Barriers to Discharge: No Barriers Identified   Patient Goals and CMS Choice Patient states their goals for this hospitalization and ongoing recovery are:: Return home with [facility]  CMS Medicare.gov Compare Post Acute Care list provided to:: Patient Represenative (must comment) Choice offered to / list presented to : Adult Children  Discharge Placement                       Discharge Plan and Services   Discharge Planning Services: CM Consult                      HH Arranged: RN, PT [facility] Agency: [place] ([facility]) Date [place]: 11/29/18 Time [place]: 1352 Representative spoke with at [place]: [place] ([place]) Interventions     Readmission Risk Interventions Readmission Risk Prevention Plan 11/24/2018 11/09/2018 11/06/2018  Transportation Screening Complete Complete Complete  PCP or Specialist Appt within 3-5 Days Complete Complete -  HRI or Home Care Consult Complete Complete -  Social Work Consult for [place] Planning/Counseling (No Data) - -  Palliative Care Screening Not Applicable - -  Medication Review Press photographer) Complete Complete -  Some recent data might be hidden

## 2018-11-29 NOTE — Discharge Summary (Signed)
[place] [facility], 85 y.o., DOB 1922/11/23, MRN [id number]. Admission date: 11/21/2018 Discharge Date 11/29/2018 Primary MD Adin Hector, MD Admitting Physician Fritzi Mandes, MD  Admission Diagnosis  AKI (acute kidney injury) [facility]) [N17.9]  Discharge Diagnosis   Active Problems: Acute on chronic renal failure stage IV Chronic bilateral hydronephrosis Sepsis with pseudomonal Metabolic encephalopathy Hypertension Hyperlipidemia Anemia of chronic disease  Hospital Course  [name] is a 85 year old male well-known to the urology service.  He is a patient of Dr. Bernardo Heater.  Please see my consult note from 11/06/2018 for his complex urologic history.  To briefly summarize, he is a 85 year old male with known history of bladder cancer and resected bladder tumors, with bilateral hydroureteronephrosis since at least January 2018.  This is likely secondary to either bladder outlet obstruction, or bilateral ureteral scarring from radiation for prostate cancer.  Patient was admitted for worsening renal function and UTI.  Patient's baseline creatinine is around 2.5.  He was admitted and was seen by nephrology and urology.  He was given IV fluids his renal function continues to stay around creatinine of 5.  He is still making good urine.  He will follow-up with his primary urologist for further intervention if his renal function does not improve.  His daughter has been kept up-to-date in his condition and plan of care.  Patient was noticed to have Pseudomonas UTI therefore he will finish course of IV antibiotics.            Consults  nephrology, urology  Significant Tests:  See full reports for all details     Ct Abdomen Pelvis Wo Contrast  Result Date: 11/06/2018 CLINICAL DATA:  Bilateral hydronephrosis. EXAM: CT ABDOMEN AND PELVIS WITHOUT CONTRAST TECHNIQUE: Multidetector CT imaging of the abdomen and pelvis was performed following the standard  protocol without IV contrast. COMPARISON:  Ultrasound of same day.  CT scan of February 28, 2018. FINDINGS: Lower chest: Mild bilateral posterior basilar subsegmental atelectasis or infiltrate is noted. Hepatobiliary: No focal liver abnormality is seen. No gallstones, gallbladder wall thickening, or biliary dilatation. Pancreas: Unremarkable. No pancreatic ductal dilatation or surrounding inflammatory changes. Spleen: Normal in size without focal abnormality. Adrenals/Urinary Tract: Adrenal glands appear normal. Right renal atrophy is noted. Bilateral nephrolithiasis is noted severe bilateral hydronephrosis is noted. No definite ureteral calculus is noted. There is seen a calcification just inferior to the right ureterovesical junction which was present on the prior exam and most likely represents phlebolith, not ureteral calculus. Probable diverticulum is seen arising from superior and right side of urinary bladder. Stomach/Bowel: The stomach appears normal. There is no evidence of bowel obstruction or inflammation. Diverticulosis is noted throughout the colon. The appendix is not clearly visualized. Vascular/Lymphatic: 5.3 cm infrarenal saccular abdominal aortic aneurysm is noted. Atherosclerosis of thoracic aorta is noted. No significant adenopathy is noted. Reproductive: Status post prostate surgery. Status post brachytherapy seed placement. Other: No ascites is noted. Mild fat containing periumbilical hernia is noted. Musculoskeletal: No acute or significant osseous findings. IMPRESSION: Bilateral nephrolithiasis is noted. Severe bilateral hydroureteronephrosis is noted without definite evidence of obstructing calculus. This is concerning for distal ureteral strictures. Status post prostatic brachytherapy seed placement and prostate surgery. 5.3 cm infrarenal abdominal aortic aneurysm. Recommend followup by abdomen and pelvis CTA in 3-6 months, and vascular surgery referral/consultation if not already obtained.  This recommendation follows ACR consensus guidelines: White Paper of the ACR Incidental Findings Committee II on Vascular Findings. J  Am Coll Radiol 2013; 10:789-794. Aortic aneurysm NOS (ICD10-I71.9). Diverticulosis is noted throughout the colon without inflammation. Mild bilateral posterior basilar subsegmental atelectasis or infiltrates are noted. Aortic Atherosclerosis (ICD10-I70.0). Electronically Signed   By: Marijo Conception M.D.   On: 11/06/2018 14:27   Ct Head Wo Contrast  Result Date: 11/05/2018 CLINICAL DATA:  85 year old with possible seizure earlier today. Hypotension upon EMS arrival. Possibly postictal upon EMS arrival. EXAM: CT HEAD WITHOUT CONTRAST TECHNIQUE: Contiguous axial images were obtained from the base of the skull through the vertex without intravenous contrast. COMPARISON:  06/16/2014 and earlier. FINDINGS: Brain: Severe age related cortical, deep and cerebellar atrophy, progressive since 22. Moderate changes of small vessel disease of the white matter diffusely, also progressive. No mass lesion. No midline shift. No acute hemorrhage or hematoma. No extra-axial fluid collections. No evidence of acute infarction. Vascular: Severe BILATERAL carotid siphon and RIGHT vertebral artery atherosclerosis. No hyperdense vessel. Skull: No skull fracture or other focal osseous abnormality involving the skull. Sinuses/Orbits: Visualized paranasal sinuses, bilateral mastoid air cells and bilateral middle ear cavities well-aerated. Senile calcifications involving both globes. No significant abnormality involving the orbits or globes. Other: None. IMPRESSION: 1. No acute intracranial abnormality. 2. Severe age related generalized atrophy and moderate chronic microvascular ischemic changes of the white matter, progressive since 2015. Electronically Signed   By: Evangeline Dakin M.D.   On: 11/05/2018 16:07   US Renal  Result Date: 11/27/2018 CLINICAL DATA:  85 year old male with a history of acute  renal failure EXAM: RENAL / URINARY TRACT ULTRASOUND COMPLETE COMPARISON:  CT 11/06/2018, ultrasound 11/06/2018 FINDINGS: Right Kidney: Length: 12.8 cm x 6.5 cm x 7.3 cm, 318 cc. Redemonstration of cortical thinning with hydronephrosis which was present on prior ultrasound and CT. Echogenicity of the right kidney cortex appears increased from the internal standard of the liver. Left Kidney: Length: 13.9 cm x 7.2 cm x 7.9 cm, 413 cc. Redemonstration of left-sided hydronephrosis. Echogenicity seems relatively symmetric to the right. Bladder: Urinary catheter in position within the bladder IMPRESSION: Ultrasound demonstrates chronic bilateral hydronephrosis, unchanged from comparison CT and ultrasound. Increased echogenicity of the bilateral renal cortex, likely reflecting element of medical renal disease. Electronically Signed   By: Corrie Mckusick D.O.   On: 11/27/2018 10:33   US Renal  Result Date: 11/06/2018 CLINICAL DATA:  Acute renal insufficiency. EXAM: RENAL / URINARY TRACT ULTRASOUND COMPLETE COMPARISON:  CT 07/16/2016 FINDINGS: Right Kidney: Renal measurements: 12.5 x 6.6 x 6.2 cm = volume: 268 mL . Echogenicity within normal limits. No mass visualized. Moderate hydronephrosis which was also noted on previous CT. Left Kidney: Renal measurements: 13.7 x 7.2 x 6.1 cm = volume: 320 mL. Echogenicity within normal limits. No mass visualized. Moderate hydronephrosis which was also noted on previous CT. Bladder: Mild-to-moderately filled as patient unable to void. Patient also unable to move as there is mild echogenic material along the dependent portion of the bladder which may represent mild wall thickening versus echogenic dependent debris. IMPRESSION: Normal size kidneys with moderate bilateral chronic hydronephrosis. No focal mass or nephrolithiasis. Mild echogenic material along the dependent portion of the bladder which may be due to wall thickening versus dependent debris. Electronically Signed   By:  Marin Olp M.D.   On: 11/06/2018 10:04   Dg Chest Port 1 View  Result Date: 11/05/2018 CLINICAL DATA:  85 year old with a seizure-like episode earlier today and now has excessive oral secretions. EXAM: PORTABLE CHEST 1 VIEW COMPARISON:  09/10/2017 and earlier. FINDINGS: Cardiac  silhouette mildly enlarged for AP portable technique, unchanged. LEFT subclavian dual lead transvenous pacemaker unchanged. Thoracic aorta atherosclerotic, unchanged. Hilar and mediastinal contours otherwise unremarkable. Airspace consolidation involving the RIGHT lung base. Linear atelectasis involving the LEFT lung base. No pleural effusions. Pulmonary vascularity normal without evidence of pulmonary edema. IMPRESSION: 1. Acute pneumonia involving the RIGHT lung base. 2. Linear atelectasis involving the LEFT lung base. 3. Stable mild cardiomegaly without pulmonary edema. Electronically Signed   By: Evangeline Dakin M.D.   On: 11/05/2018 21:07       Today   Subjective:   Blake [name] patient doing much better wants to go home Objective:   Blood pressure 133/73, pulse 72, temperature 98.6 F (37 C), temperature source Oral, resp. rate 19, height 6\' 2"  (1.88 m), weight 99.2 kg, SpO2 98 %.  .  Intake/Output Summary (Last 24 hours) at 11/29/2018 1217 Last data filed at 11/29/2018 0823 Gross per 24 hour  Intake 1498.78 ml  Output 3125 ml  Net -1626.22 ml    Exam VITAL SIGNS: Blood pressure 133/73, pulse 72, temperature 98.6 F (37 C), temperature source Oral, resp. rate 19, height 6\' 2"  (1.88 m), weight 99.2 kg, SpO2 98 %.  GENERAL:  85 y.o.-year-old patient lying in the bed with no acute distress.  EYES: Pupils equal, round, reactive to light and accommodation. No scleral icterus. Extraocular muscles intact.  HEENT: Head atraumatic, normocephalic. Oropharynx and nasopharynx clear.  NECK:  Supple, no jugular venous distention. No thyroid enlargement, no tenderness.  LUNGS: Normal breath sounds bilaterally, no  wheezing, rales,rhonchi or crepitation. No use of accessory muscles of respiration.  CARDIOVASCULAR: S1, S2 normal. No murmurs, rubs, or gallops.  ABDOMEN: Soft, nontender, nondistended. Bowel sounds present. No organomegaly or mass.  EXTREMITIES: No pedal edema, cyanosis, or clubbing.  NEUROLOGIC: Cranial nerves II through XII are intact. Muscle strength 5/5 in all extremities. Sensation intact. Gait not checked.  PSYCHIATRIC: The patient is alert and oriented x 3.  SKIN: No obvious rash, lesion, or ulcer.   Data Review     CBC w Diff:  Lab Results  Component Value Date   WBC 6.6 11/26/2018   HGB 8.0 (L) 11/29/2018   HGB 11.2 (L) 06/16/2014   HCT 25.9 (L) 11/29/2018   HCT 34.8 (L) 06/16/2014   PLT 128 (L) 11/26/2018   PLT 150 06/16/2014   LYMPHOPCT 6 11/21/2018   LYMPHOPCT 13.3 06/16/2014   MONOPCT 8 11/21/2018   MONOPCT 9.2 06/16/2014   EOSPCT 0 11/21/2018   EOSPCT 2.2 06/16/2014   BASOPCT 0 11/21/2018   BASOPCT 1.0 06/16/2014   CMP:  Lab Results  Component Value Date   NA 147 (H) 11/29/2018   NA 137 06/16/2014   K 4.1 11/29/2018   K 4.3 06/16/2014   CL 117 (H) 11/29/2018   CL 100 06/16/2014   CO2 19 (L) 11/29/2018   CO2 27 06/16/2014   BUN 63 (H) 11/29/2018   BUN 29 (H) 06/16/2014   CREATININE 5.09 (H) 11/29/2018   CREATININE 1.48 (H) 06/16/2014   PROT 7.7 11/21/2018   PROT 7.9 12/29/2012   ALBUMIN 2.5 (L) 11/29/2018   ALBUMIN 4.0 12/29/2012   BILITOT 0.4 11/21/2018   BILITOT 0.4 12/29/2012   ALKPHOS 82 11/21/2018   ALKPHOS 77 12/29/2012   AST 34 11/21/2018   AST 20 12/29/2012   ALT 32 11/21/2018   ALT 16 12/29/2012  .  Micro Results Recent Results (from the past 240 hour(s))  CULTURE, URINE COMPREHENSIVE  Status: Abnormal   Collection Time: 11/21/18 12:57 PM  Result Value Ref Range Status   Urine Culture, Comprehensive Final report (A)  Final   Organism ID, Bacteria Comment (A)  Final    Comment: Pseudomonas aeruginosa Greater than 100,000  colony forming units per mL    ANTIMICROBIAL SUSCEPTIBILITY Comment  Final    Comment:       ** S = Susceptible; I = Intermediate; R = Resistant **                    P = Positive; N = Negative             MICS are expressed in micrograms per mL    Antibiotic                 RSLT#1    RSLT#2    RSLT#3    RSLT#4 Amikacin                       S Cefepime                       S Ceftazidime                    S Ciprofloxacin                  I Gentamicin                     S Imipenem                       S Levofloxacin                   I Meropenem                      S Piperacillin                   S Ticarcillin                    S Tobramycin                     S   Microscopic Examination     Status: Abnormal   Collection Time: 11/21/18 12:58 PM  Result Value Ref Range Status   WBC, UA >30 (A) 0 - 5 /hpf Final   RBC 3-10 (A) 0 - 2 /hpf Final   Epithelial Cells (non renal) None seen 0 - 10 /hpf Final   Bacteria, UA Moderate (A) None seen/Few Final  Urine culture     Status: Abnormal   Collection Time: 11/21/18  7:48 PM  Result Value Ref Range Status   Specimen Description   Final    URINE, RANDOM Performed at [facility], [address] [place],  [address]    Special Requests   Final    NONE Performed at [facility], [address] [place], [place] [address]    Culture 80,000 COLONIES/mL PSEUDOMONAS AERUGINOSA (A)  Final   Report Status 11/24/2018 FINAL  Final   Organism ID, Bacteria PSEUDOMONAS AERUGINOSA (A)  Final      Susceptibility   Pseudomonas aeruginosa - MIC*    CEFTAZIDIME 4 SENSITIVE Sensitive     CIPROFLOXACIN 2 INTERMEDIATE Intermediate     GENTAMICIN <=1 SENSITIVE Sensitive  IMIPENEM 2 SENSITIVE Sensitive     PIP/TAZO 8 SENSITIVE Sensitive     CEFEPIME 4 SENSITIVE Sensitive     * 80,000 COLONIES/mL PSEUDOMONAS AERUGINOSA  SARS Coronavirus 2 [facility] order, Performed in [place] hospital lab)     Status: None    Collection Time: 11/21/18  9:43 PM  Result Value Ref Range Status   SARS Coronavirus 2 NEGATIVE NEGATIVE Final    Comment: (NOTE) If result is NEGATIVE SARS-CoV-2 target nucleic acids are NOT DETECTED. The SARS-CoV-2 RNA is generally detectable in upper and lower  respiratory specimens during the acute phase of infection. The lowest  concentration of SARS-CoV-2 viral copies this assay can detect is 250  copies / mL. A negative result does not preclude SARS-CoV-2 infection  and should not be used as the sole basis for treatment or other  patient management decisions.  A negative result may occur with  improper specimen collection / handling, submission of specimen other  than nasopharyngeal swab, presence of viral mutation(s) within the  areas targeted by this assay, and inadequate number of viral copies  (<250 copies / mL). A negative result must be combined with clinical  observations, patient history, and epidemiological information. If result is POSITIVE SARS-CoV-2 target nucleic acids are DETECTED. The SARS-CoV-2 RNA is generally detectable in upper and lower  respiratory specimens dur ing the acute phase of infection.  Positive  results are indicative of active infection with SARS-CoV-2.  Clinical  correlation with patient history and other diagnostic information is  necessary to determine patient infection status.  Positive results do  not rule out bacterial infection or co-infection with other viruses. If result is PRESUMPTIVE POSTIVE SARS-CoV-2 nucleic acids MAY BE PRESENT.   A presumptive positive result was obtained on the submitted specimen  and confirmed on repeat testing.  While 2019 novel coronavirus  (SARS-CoV-2) nucleic acids may be present in the submitted sample  additional confirmatory testing may be necessary for epidemiological  and / or clinical management purposes  to differentiate between  SARS-CoV-2 and other Sarbecovirus currently known to infect humans.   If clinically indicated additional testing with an alternate test  methodology [phone]) is advised. The SARS-CoV-2 RNA is generally  detectable in upper and lower respiratory sp ecimens during the acute  phase of infection. The expected result is Negative. Fact Sheet for Patients:  StrictlyIdeas.no Fact Sheet for Healthcare Providers: BankingDealers.co.za This test is not yet approved or cleared by the [place] FDA and has been authorized for detection and/or diagnosis of SARS-CoV-2 by FDA under an Emergency Use Authorization (EUA).  This EUA will remain in effect (meaning this test can be used) for the duration of the COVID-19 declaration under Section 564(b)(1) of the Act, 21 U.S.C. section 360bbb-3(b)(1), unless the authorization is terminated or revoked sooner. Performed at [facility], [address], [place], [place] [address]         Code Status Orders  (From admission, onward)         Start     Ordered   11/21/18 2216  Full code  Continuous     11/21/18 2217        Code Status History    Date Active Date Inactive Code Status Order ID Comments User Context   11/05/2018 1755 11/09/2018 1728 Full Code [id number]  Sela Hua, MD Inpatient   09/10/2017 1430 09/13/2017 2115 Full Code [id number]  Idelle Crouch, MD Inpatient   06/10/2017 2304 06/12/2017 1908 Full Code [id number]  Lance Coon, MD Inpatient   05/17/2017 0829 05/19/2017 1525 Full Code [id number]  Hillary Bow, MD ED   03/31/2017 1334 04/03/2017 1611 Full Code [id number]  Nicholes Mango, MD Inpatient   10/21/2016 0155 10/25/2016 2145 Full Code [id number]  Harvie Bridge, DO Inpatient   10/09/2016 2342 10/13/2016 2009 Full Code [id number]  Vaughan Basta, MD Inpatient   04/14/2015 1432 04/15/2015 1739 Full Code [id number]  Isaias Cowman, MD Inpatient   04/09/2015 0022 04/14/2015 1432 Full Code [id number]  Hower, Aaron Mose, MD ED    Advance  Directive Documentation     Most Recent Value  Type of Advance Directive  Healthcare Power of Attorney, Living will  Pre-existing out of facility DNR order (yellow form or pink MOST form)  -  "MOST" Form in Place?  -          Follow-up Information    Adin Hector, MD. Go on 12/05/2018.   Specialty:  Internal Medicine Why:  @ 11:15am Contact information: [place] [address] [phone]        Murlean Iba, MD. Go on 12/26/2018.   Specialty:  Nephrology Why:  @ 1:20pm Contact information: [place] [address] [phone]        Abbie Sons, MD. Go on 12/21/2018.   Specialty:  Urology Why:  @ 3pm Contact information: [place] [address] [phone]           Discharge Medications   Allergies as of 11/29/2018      Reactions   Diovan [valsartan] Cough   Gabapentin Other (See Comments)   Sedation at all doses   Hydralazine Itching   Lisinopril Cough   Pregabalin Other (See Comments)   Sedation at all doses   Levofloxacin Other (See Comments)   Too many side effects. Nausea, vomiting, upset stomach, increased confusion, etc Other reaction(s): Confusion Nausea, chills Nausea, chills   Sulfamethoxazole-trimethoprim Other (See Comments)   Too many side effects. Nausea, vomiting, upset stomach, increased confusion, etc      Medication List    STOP taking these medications   cefpodoxime 200 MG tablet Commonly known as:  VANTIN   Cephalexin 500 MG tablet   doxycycline 100 MG capsule Commonly known as:  VIBRAMYCIN   glimepiride 2 MG tablet Commonly known as:  AMARYL     TAKE these medications   amLODipine 2.5 MG tablet Commonly known as:  NORVASC Take 1 tablet (2.5 mg total) by mouth daily.   cefTAZidime  IVPB Commonly known as:  FORTAZ Inject 1 g into the vein daily. Indication: pseudomonas UTI Last Day of Therapy:   12/03/2018 Once weekly: CBC/D, BMP   finasteride 5 MG tablet Commonly known as:  PROSCAR Take 5 mg by mouth daily.   Metoprolol Tartrate 37.5 MG Tabs Take 37.5 mg by mouth 2 (two) times daily. What changed:  how much to take   pantoprazole 40 MG tablet Commonly known as:  PROTONIX Take 40 mg by mouth daily.   PRESERVISION/LUTEIN PO Take 1 capsule by mouth 2 (two) times daily.   multivitamin-iron-minerals-folic acid chewable tablet Chew 2 tablets by mouth daily.   rosuvastatin 5 MG tablet Commonly known as:  CRESTOR Take 5 mg by mouth at bedtime.   sitaGLIPtin 50 MG tablet Commonly known as:  JANUVIA Take 50 mg by mouth daily.   sodium bicarbonate 650 MG tablet Take 1 tablet (650 mg total) by mouth 2 (  two) times daily.   tamsulosin 0.4 MG Caps capsule Commonly known as:  FLOMAX Take 0.4 mg by mouth daily.   VITRON-C PO Take 1 tablet by mouth daily.            Home Infusion Instuctions  (From admission, onward)         Start     Ordered   11/29/18 0000  Home infusion instructions [facility] May follow [place] Dosing Protocol; May administer Cathflo as needed to maintain patency of vascular access device.; Flushing of vascular access device: per [facility] Protocol: 0.9% NaCl pre/post medica...    Question Answer Comment  Instructions May follow [place] Dosing Protocol   Instructions May administer Cathflo as needed to maintain patency of vascular access device.   Instructions Flushing of vascular access device: per [facility] Protocol: 0.9% NaCl pre/post medication administration and prn patency; Heparin 100 u/ml, 64ml for implanted ports and Heparin 10u/ml, 25ml for all other central venous catheters.   Instructions May follow [facility] Anaphylaxis Protocol for First Dose Administration in the home: 0.9% NaCl at 25-50 ml/hr to maintain IV access for protocol meds. Epinephrine 0.3 ml IV/IM PRN and Benadryl 25-50 IV/IM PRN s/s of anaphylaxis.   Instructions [facility] Infusion Coordinator (RN) to assist per patient IV care needs in the home PRN.      11/29/18 1037             Total Time in preparing paper work, data evaluation and todays exam - 28 minutes  Dustin Flock M.D on 11/29/2018 at 12:17 PM [place]  [phone]

## 2018-12-07 ENCOUNTER — Other Ambulatory Visit: Payer: Self-pay

## 2018-12-07 ENCOUNTER — Telehealth: Payer: Self-pay | Admitting: Urology

## 2018-12-07 ENCOUNTER — Ambulatory Visit (INDEPENDENT_AMBULATORY_CARE_PROVIDER_SITE_OTHER): Payer: Medicare Other

## 2018-12-07 DIAGNOSIS — C679 Malignant neoplasm of bladder, unspecified: Secondary | ICD-10-CM

## 2018-12-07 NOTE — Progress Notes (Signed)
Patient present today with son to be taught how to irrigate bladder if catheter becomes clogged  Bladder Irrigation  Instructions were gone over in detail and demonstrated to patient's son on how to properly irrigate. Irrigation only necessary if catheter is not draining. 70 ml of saline/sterile water was instilled and irrigated into the bladder with a 69ml [place]y syringe through the catheter in place.  After irrigation urine flow was noted no complications were noted catheter is now draining fine.  Catheter was reattached to the .night bag for drainage. Patient tolerated well.   Preformed by: Fonnie Jarvis, CMA

## 2018-12-07 NOTE — Telephone Encounter (Signed)
Pt's daughter, Brunetta Genera, states brother is currently with pt at Dr. Keturah Barre office.  They were supposed to call our office to discuss foley catheter that needs to be addressed, preferably today.  Please give her a call [phone].

## 2018-12-07 NOTE — Telephone Encounter (Signed)
Per Dr. Bernardo Heater have patient and caregiver come to office to learn how to irrigate and send home with supplies. Spoke w/patient's daughter and notified her of this. She states that her brother is with patient and will bring to office now

## 2018-12-12 ENCOUNTER — Telehealth: Payer: Self-pay | Admitting: Urology

## 2018-12-12 NOTE — Telephone Encounter (Signed)
Patient's son called the office today regarding concern for his dad.  He is requesting that Dr. Bernardo Heater call his sister, Dr. Lianne Moris to discuss.  She can be reached at [phone].    Patient went to see his nephrologist and they are requesting for the patient to be seen sooner by Dr. Bernardo Heater.

## 2018-12-14 NOTE — Telephone Encounter (Signed)
I had a long discussion with Dr. Primus Bravo on 12/12/2018.  Her father's creatinine is slowly rising despite Foley catheter drainage.  He does have bilateral hydronephrosis.  He saw Dr. Candiss Norse and a discussed dialysis which Dr. Candiss Norse did not recommend.  I would also not recommend dialysis in [name].  I think his best treatment option would be bilateral percutaneous nephrostomy tube placement which would most likely allow removal of his Foley catheter.  If this did not improve his renal function then this would reflect progression of chronic kidney disease.  We have discussed previously on several occasions cystoscopy under anesthesia with TURBT and bilateral stent placement however the family does not desire due to anesthetic risks.  I also discussed no treatment with the possibility of progressive worsening of his renal function.  She is leaning towards nephrostomy tube placement however would like to delay until COVID-19 restrictions have been lifted so that someone may accompanying him to that appointment.  He is scheduled to see me next week and creatinine will be checked at that time.  If stable he should be okay to delay nephrostomy tube placement for a few weeks.

## 2018-12-15 ENCOUNTER — Other Ambulatory Visit: Payer: Self-pay

## 2018-12-18 ENCOUNTER — Telehealth: Payer: Self-pay

## 2018-12-18 NOTE — Telephone Encounter (Signed)
No update on [name] status from home health called number given [phone] no answer left a voicemail to contact our office with an update on patient status

## 2018-12-18 NOTE — Telephone Encounter (Signed)
Caryl Pina from home health called stating that there was a miscommunication with the CNA caring for the patient this morning. Patient was tugging on catheter but it did not come out. Caryl Pina confirmed there was no blood or injury noted and patient denies pain.

## 2018-12-18 NOTE — Telephone Encounter (Signed)
Patient's home health nurse called stating she was notified patient "pulled out catheter" and wanted to know how to proceed. She was on her way to the patient's home and would examine to see if it was a traumatic pull, blood noted, or pain. Or If balloon became deflated and foley fell out. She will call back to notify for further instructions.

## 2018-12-19 ENCOUNTER — Other Ambulatory Visit: Payer: Self-pay | Admitting: Family Medicine

## 2018-12-19 DIAGNOSIS — C679 Malignant neoplasm of bladder, unspecified: Secondary | ICD-10-CM

## 2018-12-20 ENCOUNTER — Other Ambulatory Visit: Payer: Medicare Other

## 2018-12-20 ENCOUNTER — Ambulatory Visit (INDEPENDENT_AMBULATORY_CARE_PROVIDER_SITE_OTHER): Payer: Medicare Other | Admitting: Nurse Practitioner

## 2018-12-20 ENCOUNTER — Encounter: Payer: Self-pay | Admitting: Urology

## 2018-12-20 ENCOUNTER — Other Ambulatory Visit (INDEPENDENT_AMBULATORY_CARE_PROVIDER_SITE_OTHER): Payer: Medicare Other

## 2018-12-21 ENCOUNTER — Ambulatory Visit (INDEPENDENT_AMBULATORY_CARE_PROVIDER_SITE_OTHER): Payer: Medicare Other | Admitting: Urology

## 2018-12-21 ENCOUNTER — Ambulatory Visit: Payer: Medicare Other | Admitting: Urology

## 2018-12-21 ENCOUNTER — Encounter: Payer: Self-pay | Admitting: Urology

## 2018-12-21 ENCOUNTER — Other Ambulatory Visit: Payer: Self-pay

## 2018-12-21 VITALS — BP 153/81 | HR 66 | Ht 74.0 in

## 2018-12-21 DIAGNOSIS — N183 Chronic kidney disease, stage 3 unspecified: Secondary | ICD-10-CM

## 2018-12-21 DIAGNOSIS — C679 Malignant neoplasm of bladder, unspecified: Secondary | ICD-10-CM

## 2018-12-21 DIAGNOSIS — N1330 Unspecified hydronephrosis: Secondary | ICD-10-CM | POA: Diagnosis not present

## 2018-12-21 DIAGNOSIS — N179 Acute kidney failure, unspecified: Secondary | ICD-10-CM

## 2018-12-21 NOTE — Progress Notes (Signed)
12/21/2018 1:52 PM   Blake [name] Nov 07, 1922 [id number]  Referring provider: Adin Hector, MD [facility] [place],  [place] [address]  Chief Complaint  Patient presents with  . Follow-up    HPI: 85 year old male with history of chronic bilateral ureterohydronephrosis and recurrent UTI.  He recently has had a slowly rising creatinine.  Foley catheter drainage has not made a significant impact on his renal function.  Myself and Dr. Diamantina Providence have had in-depth discussions with his daughter who is his HCPOA and feel the best course of action would be placement of bilateral percutaneous nephrostomy tubes.  He has urothelial carcinoma the bladder however his daughter has declined anesthesia.  He is also followed by nephrology and Dr. Candiss Norse is in agreement with nephrostomy tubes.  When I spoke with her last week she is in agreement with PCN however would prefer to wait until the hospital is opened up to visitors so that a family member can accompany him.  His creatinine last week was 6.86.  PMH: Past Medical History:  Diagnosis Date  . AAA (abdominal aortic aneurysm) ([place])   . Arrhythmia   . Diabetes mellitus without complication ([place])   . GI bleed   . Heart murmur   . Hyperlipidemia   . Hypertension   . Pacemaker   . Prostate cancer [facility])     Surgical History: Past Surgical History:  Procedure Laterality Date  . FLEXIBLE SIGMOIDOSCOPY N/A 10/21/2016   Procedure: FLEXIBLE SIGMOIDOSCOPY;  Surgeon: [name], MD;  Location: [facility] ENDOSCOPY;  Service: Endoscopy;  Laterality: N/A;  . JOINT REPLACEMENT    . PACEMAKER INSERTION Left 04/14/2015   Procedure: INSERTION PACEMAKER;  Surgeon: Isaias Cowman, MD;  Location: [facility] ORS;  Service: Cardiovascular;  Laterality: Left;  . PROSTATE SURGERY    . VISCERAL ARTERY INTERVENTION N/A 10/22/2016   Procedure: Visceral Artery Intervention;  Surgeon: Algernon Huxley, MD;  Location: [facility];   Service: Cardiovascular;  Laterality: N/A;    Home Medications:  Allergies as of 12/21/2018      Reactions   Diovan [valsartan] Cough   Gabapentin Other (See Comments)   Sedation at all doses   Hydralazine Itching   Lisinopril Cough   Pregabalin Other (See Comments)   Sedation at all doses   Levofloxacin Other (See Comments)   Too many side effects. Nausea, vomiting, upset stomach, increased confusion, etc Other reaction(s): Confusion Nausea, chills Nausea, chills   Sulfamethoxazole-trimethoprim Other (See Comments)   Too many side effects. Nausea, vomiting, upset stomach, increased confusion, etc      Medication List       Accurate as of December 21, 2018  1:52 PM. If you have any questions, ask your nurse or doctor.        amLODipine 2.5 MG tablet Commonly known as: NORVASC Take 1 tablet (2.5 mg total) by mouth daily.   cefTAZidime  IVPB Commonly known as: FORTAZ Inject 1 g into the vein daily. Indication: pseudomonas UTI Last Day of Therapy:  12/03/2018 Once weekly: CBC/D, BMP   finasteride 5 MG tablet Commonly known as: PROSCAR Take 5 mg by mouth daily.   Metoprolol Tartrate 37.5 MG Tabs Take 37.5 mg by mouth 2 (two) times daily. What changed: how much to take   pantoprazole 40 MG tablet Commonly known as: PROTONIX Take 40 mg by mouth daily.   PRESERVISION/LUTEIN PO Take 1 capsule by mouth 2 (two) times daily.   multivitamin-iron-minerals-folic acid chewable tablet  Chew 2 tablets by mouth daily.   rosuvastatin 5 MG tablet Commonly known as: CRESTOR Take 5 mg by mouth at bedtime.   sitaGLIPtin 50 MG tablet Commonly known as: JANUVIA Take 50 mg by mouth daily.   sodium bicarbonate 650 MG tablet Take 1 tablet (650 mg total) by mouth 2 (two) times daily.   tamsulosin 0.4 MG Caps capsule Commonly known as: FLOMAX Take 0.4 mg by mouth daily.   VITRON-C PO Take 1 tablet by mouth daily.       Allergies:  Allergies  Allergen Reactions  . Diovan  [Valsartan] Cough  . Gabapentin Other (See Comments)    Sedation at all doses  . Hydralazine Itching  . Lisinopril Cough  . Pregabalin Other (See Comments)    Sedation at all doses  . Levofloxacin Other (See Comments)    Too many side effects. Nausea, vomiting, upset stomach, increased confusion, etc Other reaction(s): Confusion Nausea, chills Nausea, chills   . Sulfamethoxazole-Trimethoprim Other (See Comments)    Too many side effects. Nausea, vomiting, upset stomach, increased confusion, etc    Family History: Family History  Problem Relation Age of Onset  . Hypertension Other   . Stroke Other   . Heart attack Other   . Stroke Sister   . Heart attack Brother     Social History:  reports that he has quit smoking. He has never used smokeless tobacco. He reports that he does not drink alcohol or use drugs.  ROS: UROLOGY Frequent Urination?: No Hard to postpone urination?: No Burning/pain with urination?: No Get up at night to urinate?: No Leakage of urine?: No Urine stream starts and stops?: Yes Trouble starting stream?: No Do you have to strain to urinate?: No Blood in urine?: Yes Urinary tract infection?: Yes Sexually transmitted disease?: No Injury to kidneys or bladder?: Yes Painful intercourse?: No Weak stream?: No Erection problems?: No Penile pain?: No  Gastrointestinal Nausea?: No Vomiting?: No Indigestion/heartburn?: No Diarrhea?: No Constipation?: No  Constitutional Fever: No Night sweats?: No Weight loss?: No Fatigue?: No  Skin Skin rash/lesions?: No Itching?: No  Eyes Blurred vision?: No Double vision?: No  Ears/Nose/Throat Sore throat?: No Sinus problems?: No  Hematologic/Lymphatic Swollen glands?: No Easy bruising?: No  Cardiovascular Leg swelling?: No Chest pain?: No  Respiratory Cough?: No Shortness of breath?: No  Endocrine Excessive thirst?: No  Musculoskeletal Back pain?: No Joint pain?: No  Neurological  Headaches?: No Dizziness?: No  Psychologic Depression?: No Anxiety?: No  Physical Exam: BP (!) 153/81 (BP Location: Left Arm, Patient Position: Sitting, Cuff Size: Large)   Pulse 66   Ht 6\' 2"  (1.88 m)   BMI 28.08 kg/m   Constitutional:  Alert and oriented, No acute distress. HEENT: [place] AT, moist mucus membranes.  Trachea midline, no masses. Cardiovascular: No clubbing, cyanosis, or edema. Respiratory: Normal respiratory effort, no increased work of breathing. GU: No CVA tenderness.  Foley catheter draining clear urine Skin: No rashes, bruises or suspicious lesions. Neurologic: Grossly intact, no focal deficits, moving all 4 extremities. Psychiatric: Normal mood and affect.  Assessment & Plan:   85 year old male with bilateral hydronephrosis/hydroureter and a rising creatinine.  Mr. Crabbe was accompanied by his son today.  Options were again reviewed including cystoscopy with TUR BT and attempted bilateral stent placement, bilateral PCN.  We also discussed the option of doing nothing however if his creatinine continued to rise that would result.  They are in agreement with percutaneous nephrostomy tubes.  His creatinine was repeated today  and will discuss with Dr. Candiss Norse recommendations regarding timing of tube placement.  Abbie Sons, [facility] [address] [place], [place] [address] [phone]

## 2018-12-22 LAB — BASIC METABOLIC PANEL
BUN/Creatinine Ratio: 12 (ref 10–24)
BUN: 68 mg/dL — ABNORMAL HIGH (ref 10–36)
CO2: 20 mmol/L (ref 20–29)
Calcium: 8.3 mg/dL — ABNORMAL LOW (ref 8.6–10.2)
Chloride: 102 mmol/L (ref 96–106)
Creatinine, Ser: 5.68 mg/dL — ABNORMAL HIGH (ref 0.76–1.27)
GFR calc Af Amer: 9 mL/min/{1.73_m2} — ABNORMAL LOW (ref >59)
GFR calc non Af Amer: 8 mL/min/{1.73_m2} — ABNORMAL LOW (ref >59)
Glucose: 147 mg/dL — ABNORMAL HIGH (ref 65–99)
Potassium: 4.7 mmol/L (ref 3.5–5.2)
Sodium: 141 mmol/L (ref 134–144)

## 2018-12-24 ENCOUNTER — Encounter: Payer: Self-pay | Admitting: Urology

## 2018-12-25 ENCOUNTER — Inpatient Hospital Stay
Admission: EM | Admit: 2018-12-25 | Discharge: 2019-01-02 | DRG: 871 | Disposition: A | Discharge status: Home Health | Payer: Medicare Other | Attending: Internal Medicine | Admitting: Internal Medicine

## 2018-12-25 ENCOUNTER — Emergency Department: Payer: Medicare Other

## 2018-12-25 ENCOUNTER — Other Ambulatory Visit: Payer: Self-pay

## 2018-12-25 ENCOUNTER — Inpatient Hospital Stay: Payer: Medicare Other

## 2018-12-25 ENCOUNTER — Encounter: Payer: Self-pay | Admitting: Emergency Medicine

## 2018-12-25 DIAGNOSIS — Z823 Family history of stroke: Secondary | ICD-10-CM | POA: Diagnosis not present

## 2018-12-25 DIAGNOSIS — Z881 Allergy status to other antibiotic agents status: Secondary | ICD-10-CM

## 2018-12-25 DIAGNOSIS — N390 Urinary tract infection, site not specified: Secondary | ICD-10-CM | POA: Diagnosis not present

## 2018-12-25 DIAGNOSIS — Z95828 Presence of other vascular implants and grafts: Secondary | ICD-10-CM | POA: Diagnosis not present

## 2018-12-25 DIAGNOSIS — Z87891 Personal history of nicotine dependence: Secondary | ICD-10-CM | POA: Diagnosis not present

## 2018-12-25 DIAGNOSIS — Z8546 Personal history of malignant neoplasm of prostate: Secondary | ICD-10-CM | POA: Diagnosis not present

## 2018-12-25 DIAGNOSIS — I509 Heart failure, unspecified: Secondary | ICD-10-CM

## 2018-12-25 DIAGNOSIS — E876 Hypokalemia: Secondary | ICD-10-CM | POA: Diagnosis not present

## 2018-12-25 DIAGNOSIS — C679 Malignant neoplasm of bladder, unspecified: Secondary | ICD-10-CM | POA: Diagnosis present

## 2018-12-25 DIAGNOSIS — E785 Hyperlipidemia, unspecified: Secondary | ICD-10-CM | POA: Diagnosis present

## 2018-12-25 DIAGNOSIS — R0603 Acute respiratory distress: Secondary | ICD-10-CM | POA: Diagnosis not present

## 2018-12-25 DIAGNOSIS — Z9889 Other specified postprocedural states: Secondary | ICD-10-CM | POA: Diagnosis not present

## 2018-12-25 DIAGNOSIS — E1122 Type 2 diabetes mellitus with diabetic chronic kidney disease: Secondary | ICD-10-CM | POA: Diagnosis present

## 2018-12-25 DIAGNOSIS — Z515 Encounter for palliative care: Secondary | ICD-10-CM | POA: Diagnosis not present

## 2018-12-25 DIAGNOSIS — Z882 Allergy status to sulfonamides status: Secondary | ICD-10-CM

## 2018-12-25 DIAGNOSIS — E871 Hypo-osmolality and hyponatremia: Secondary | ICD-10-CM | POA: Diagnosis not present

## 2018-12-25 DIAGNOSIS — Z20828 Contact with and (suspected) exposure to other viral communicable diseases: Secondary | ICD-10-CM | POA: Diagnosis present

## 2018-12-25 DIAGNOSIS — N184 Chronic kidney disease, stage 4 (severe): Secondary | ICD-10-CM | POA: Diagnosis present

## 2018-12-25 DIAGNOSIS — Z87440 Personal history of urinary (tract) infections: Secondary | ICD-10-CM

## 2018-12-25 DIAGNOSIS — I130 Hypertensive heart and chronic kidney disease with heart failure and stage 1 through stage 4 chronic kidney disease, or unspecified chronic kidney disease: Secondary | ICD-10-CM | POA: Diagnosis present

## 2018-12-25 DIAGNOSIS — I714 Abdominal aortic aneurysm, without rupture: Secondary | ICD-10-CM | POA: Diagnosis present

## 2018-12-25 DIAGNOSIS — R569 Unspecified convulsions: Secondary | ICD-10-CM | POA: Diagnosis not present

## 2018-12-25 DIAGNOSIS — R0902 Hypoxemia: Secondary | ICD-10-CM | POA: Diagnosis not present

## 2018-12-25 DIAGNOSIS — N179 Acute kidney failure, unspecified: Secondary | ICD-10-CM | POA: Diagnosis present

## 2018-12-25 DIAGNOSIS — Z950 Presence of cardiac pacemaker: Secondary | ICD-10-CM | POA: Diagnosis not present

## 2018-12-25 DIAGNOSIS — Z96652 Presence of left artificial knee joint: Secondary | ICD-10-CM | POA: Diagnosis present

## 2018-12-25 DIAGNOSIS — Z7189 Other specified counseling: Secondary | ICD-10-CM | POA: Diagnosis not present

## 2018-12-25 DIAGNOSIS — B965 Pseudomonas (aeruginosa) (mallei) (pseudomallei) as the cause of diseases classified elsewhere: Secondary | ICD-10-CM | POA: Diagnosis not present

## 2018-12-25 DIAGNOSIS — N1330 Unspecified hydronephrosis: Secondary | ICD-10-CM

## 2018-12-25 DIAGNOSIS — K219 Gastro-esophageal reflux disease without esophagitis: Secondary | ICD-10-CM | POA: Diagnosis present

## 2018-12-25 DIAGNOSIS — Z905 Acquired absence of kidney: Secondary | ICD-10-CM

## 2018-12-25 DIAGNOSIS — Z7984 Long term (current) use of oral hypoglycemic drugs: Secondary | ICD-10-CM

## 2018-12-25 DIAGNOSIS — Z888 Allergy status to other drugs, medicaments and biological substances status: Secondary | ICD-10-CM

## 2018-12-25 DIAGNOSIS — I5031 Acute diastolic (congestive) heart failure: Secondary | ICD-10-CM | POA: Diagnosis not present

## 2018-12-25 DIAGNOSIS — E872 Acidosis: Secondary | ICD-10-CM | POA: Diagnosis present

## 2018-12-25 DIAGNOSIS — I4891 Unspecified atrial fibrillation: Secondary | ICD-10-CM | POA: Diagnosis present

## 2018-12-25 DIAGNOSIS — A4152 Sepsis due to Pseudomonas: Secondary | ICD-10-CM | POA: Diagnosis present

## 2018-12-25 DIAGNOSIS — R0602 Shortness of breath: Secondary | ICD-10-CM | POA: Diagnosis not present

## 2018-12-25 DIAGNOSIS — Z923 Personal history of irradiation: Secondary | ICD-10-CM

## 2018-12-25 DIAGNOSIS — C61 Malignant neoplasm of prostate: Secondary | ICD-10-CM | POA: Diagnosis not present

## 2018-12-25 DIAGNOSIS — Z8249 Family history of ischemic heart disease and other diseases of the circulatory system: Secondary | ICD-10-CM

## 2018-12-25 DIAGNOSIS — I252 Old myocardial infarction: Secondary | ICD-10-CM

## 2018-12-25 DIAGNOSIS — N136 Pyonephrosis: Secondary | ICD-10-CM | POA: Diagnosis present

## 2018-12-25 DIAGNOSIS — D72829 Elevated white blood cell count, unspecified: Secondary | ICD-10-CM | POA: Diagnosis not present

## 2018-12-25 DIAGNOSIS — N400 Enlarged prostate without lower urinary tract symptoms: Secondary | ICD-10-CM | POA: Diagnosis present

## 2018-12-25 DIAGNOSIS — Z960 Presence of urogenital implants: Secondary | ICD-10-CM | POA: Diagnosis not present

## 2018-12-25 DIAGNOSIS — I5033 Acute on chronic diastolic (congestive) heart failure: Secondary | ICD-10-CM | POA: Diagnosis present

## 2018-12-25 DIAGNOSIS — R7881 Bacteremia: Secondary | ICD-10-CM | POA: Diagnosis not present

## 2018-12-25 DIAGNOSIS — N1339 Other hydronephrosis: Secondary | ICD-10-CM | POA: Diagnosis not present

## 2018-12-25 DIAGNOSIS — R319 Hematuria, unspecified: Secondary | ICD-10-CM | POA: Diagnosis not present

## 2018-12-25 DIAGNOSIS — Z936 Other artificial openings of urinary tract status: Secondary | ICD-10-CM | POA: Diagnosis not present

## 2018-12-25 DIAGNOSIS — D649 Anemia, unspecified: Secondary | ICD-10-CM | POA: Diagnosis not present

## 2018-12-25 DIAGNOSIS — D631 Anemia in chronic kidney disease: Secondary | ICD-10-CM | POA: Diagnosis present

## 2018-12-25 DIAGNOSIS — M1990 Unspecified osteoarthritis, unspecified site: Secondary | ICD-10-CM | POA: Diagnosis present

## 2018-12-25 DIAGNOSIS — Z79899 Other long term (current) drug therapy: Secondary | ICD-10-CM

## 2018-12-25 DIAGNOSIS — C7911 Secondary malignant neoplasm of bladder: Secondary | ICD-10-CM | POA: Diagnosis not present

## 2018-12-25 DIAGNOSIS — E870 Hyperosmolality and hypernatremia: Secondary | ICD-10-CM | POA: Diagnosis not present

## 2018-12-25 LAB — URINALYSIS, COMPLETE (UACMP) WITH MICROSCOPIC
Bacteria, UA: NONE SEEN
Bilirubin Urine: NEGATIVE
Glucose, UA: NEGATIVE mg/dL
Ketones, ur: NEGATIVE mg/dL
Nitrite: NEGATIVE
Protein, ur: 30 mg/dL — AB
Specific Gravity, Urine: 1.01 (ref 1.005–1.030)
WBC, UA: >50 WBC/hpf — ABNORMAL HIGH (ref 0–5)
pH: 6 (ref 5.0–8.0)

## 2018-12-25 LAB — COMPREHENSIVE METABOLIC PANEL
ALT: 18 U/L (ref 0–44)
AST: 16 U/L (ref 15–41)
Albumin: 3 g/dL — ABNORMAL LOW (ref 3.5–5.0)
Alkaline Phosphatase: 67 U/L (ref 38–126)
Anion gap: 14 (ref 5–15)
BUN: 70 mg/dL — ABNORMAL HIGH (ref 8–23)
CO2: 23 mmol/L (ref 22–32)
Calcium: 8.2 mg/dL — ABNORMAL LOW (ref 8.9–10.3)
Chloride: 102 mmol/L (ref 98–111)
Creatinine, Ser: 5.21 mg/dL — ABNORMAL HIGH (ref 0.61–1.24)
GFR calc Af Amer: 10 mL/min — ABNORMAL LOW (ref >60)
GFR calc non Af Amer: 9 mL/min — ABNORMAL LOW (ref >60)
Glucose, Bld: 151 mg/dL — ABNORMAL HIGH (ref 70–99)
Potassium: 5.1 mmol/L (ref 3.5–5.1)
Sodium: 139 mmol/L (ref 135–145)
Total Bilirubin: 0.5 mg/dL (ref 0.3–1.2)
Total Protein: 7.8 g/dL (ref 6.5–8.1)

## 2018-12-25 LAB — CBC WITH DIFFERENTIAL/PLATELET
Abs Immature Granulocytes: 0.11 10*3/uL — ABNORMAL HIGH (ref 0.00–0.07)
Basophils Absolute: 0 10*3/uL (ref 0.0–0.1)
Basophils Relative: 1 %
Eosinophils Absolute: 0.1 10*3/uL (ref 0.0–0.5)
Eosinophils Relative: 2 %
HCT: 27.8 % — ABNORMAL LOW (ref 39.0–52.0)
Hemoglobin: 8.3 g/dL — ABNORMAL LOW (ref 13.0–17.0)
Immature Granulocytes: 1 %
Lymphocytes Relative: 10 %
Lymphs Abs: 0.8 10*3/uL (ref 0.7–4.0)
MCH: 26.3 pg (ref 26.0–34.0)
MCHC: 29.9 g/dL — ABNORMAL LOW (ref 30.0–36.0)
MCV: 88.3 fL (ref 80.0–100.0)
Monocytes Absolute: 0.5 10*3/uL (ref 0.1–1.0)
Monocytes Relative: 5 %
Neutro Abs: 6.9 10*3/uL (ref 1.7–7.7)
Neutrophils Relative %: 81 %
Platelets: 128 10*3/uL — ABNORMAL LOW (ref 150–400)
RBC: 3.15 MIL/uL — ABNORMAL LOW (ref 4.22–5.81)
RDW: 16.6 % — ABNORMAL HIGH (ref 11.5–15.5)
WBC: 8.5 10*3/uL (ref 4.0–10.5)
nRBC: 0 % (ref 0.0–0.2)

## 2018-12-25 LAB — GLUCOSE, CAPILLARY
Glucose-Capillary: 117 mg/dL — ABNORMAL HIGH (ref 70–99)
Glucose-Capillary: 164 mg/dL — ABNORMAL HIGH (ref 70–99)
Glucose-Capillary: 135 mg/dL — ABNORMAL HIGH (ref 70–99)

## 2018-12-25 LAB — BRAIN NATRIURETIC PEPTIDE: B Natriuretic Peptide: 1226 pg/mL — ABNORMAL HIGH (ref 0.0–100.0)

## 2018-12-25 LAB — LACTIC ACID, PLASMA
Lactic Acid, Venous: 1.3 mmol/L (ref 0.5–1.9)
Lactic Acid, Venous: 1 mmol/L (ref 0.5–1.9)

## 2018-12-25 LAB — TROPONIN I: Troponin I: 0.06 ng/mL (ref <0.03)

## 2018-12-25 LAB — PROCALCITONIN: Procalcitonin: <0.1 ng/mL

## 2018-12-25 LAB — SARS CORONAVIRUS 2 BY RT PCR (HOSPITAL ORDER, PERFORMED IN ~~LOC~~ HOSPITAL LAB): SARS Coronavirus 2: NEGATIVE

## 2018-12-25 MED ORDER — SODIUM CHLORIDE 0.9 % IV SOLN
500.0000 mg | INTRAVENOUS | Status: DC
  Filled 2018-12-25: qty 500

## 2018-12-25 MED ORDER — INSULIN ASPART 100 UNIT/ML ~~LOC~~ SOLN
0.0000 [IU] | Freq: Three times a day (TID) | SUBCUTANEOUS | Status: DC
  Administered 2018-12-25: 2 [IU] via SUBCUTANEOUS
  Administered 2018-12-26 – 2018-12-27 (×2): 1 [IU] via SUBCUTANEOUS
  Administered 2018-12-27: 2 [IU] via SUBCUTANEOUS
  Administered 2018-12-28 (×2): 1 [IU] via SUBCUTANEOUS
  Administered 2018-12-30: 2 [IU] via SUBCUTANEOUS
  Administered 2018-12-30 – 2018-12-31 (×3): 1 [IU] via SUBCUTANEOUS
  Administered 2019-01-01: 2 [IU] via SUBCUTANEOUS
  Administered 2019-01-01: 1 [IU] via SUBCUTANEOUS
  Administered 2019-01-02: 2 [IU] via SUBCUTANEOUS
  Filled 2018-12-25 (×14): qty 1

## 2018-12-25 MED ORDER — INSULIN ASPART 100 UNIT/ML ~~LOC~~ SOLN
0.0000 [IU] | Freq: Every day | SUBCUTANEOUS | Status: DC
  Administered 2018-12-30: 2 [IU] via SUBCUTANEOUS
  Filled 2018-12-25: qty 1

## 2018-12-25 MED ORDER — ACETAMINOPHEN 650 MG RE SUPP: 650.0000 mg | Freq: Four times a day (QID) | RECTAL | Status: DC | PRN

## 2018-12-25 MED ORDER — TAMSULOSIN HCL 0.4 MG PO CAPS
0.4000 mg | ORAL_CAPSULE | Freq: Every day | ORAL | Status: DC
  Administered 2018-12-25 – 2019-01-02 (×8): 0.4 mg via ORAL
  Filled 2018-12-25 (×8): qty 1

## 2018-12-25 MED ORDER — FUROSEMIDE 10 MG/ML IJ SOLN
20.0000 mg | Freq: Once | INTRAMUSCULAR | Status: AC
  Administered 2018-12-25: 20 mg via INTRAVENOUS
  Filled 2018-12-25: qty 4

## 2018-12-25 MED ORDER — ONDANSETRON HCL 4 MG/2ML IJ SOLN: 4.0000 mg | Freq: Four times a day (QID) | INTRAMUSCULAR | Status: DC | PRN

## 2018-12-25 MED ORDER — SODIUM CHLORIDE 0.9 % IV SOLN
1.0000 g | INTRAVENOUS | Status: DC
  Filled 2018-12-25: qty 10

## 2018-12-25 MED ORDER — AMLODIPINE BESYLATE 5 MG PO TABS
2.5000 mg | ORAL_TABLET | Freq: Every day | ORAL | Status: DC
  Administered 2018-12-25 – 2019-01-02 (×9): 2.5 mg via ORAL
  Filled 2018-12-25 (×9): qty 1

## 2018-12-25 MED ORDER — SODIUM BICARBONATE 650 MG PO TABS
650.0000 mg | ORAL_TABLET | Freq: Two times a day (BID) | ORAL | Status: DC
  Administered 2018-12-25 – 2018-12-28 (×8): 650 mg via ORAL
  Filled 2018-12-25 (×8): qty 1

## 2018-12-25 MED ORDER — SODIUM CHLORIDE 0.9% FLUSH: 3.0000 mL | INTRAVENOUS | Status: DC | PRN

## 2018-12-25 MED ORDER — SODIUM CHLORIDE 0.9% FLUSH
3.0000 mL | Freq: Two times a day (BID) | INTRAVENOUS | Status: DC
  Administered 2018-12-25 – 2018-12-30 (×10): 3 mL via INTRAVENOUS

## 2018-12-25 MED ORDER — SODIUM CHLORIDE 0.9 % IV SOLN
INTRAVENOUS | Status: DC
  Administered 2018-12-26 – 2018-12-31 (×3): via INTRAVENOUS

## 2018-12-25 MED ORDER — SODIUM CHLORIDE 0.9 % IV SOLN
500.0000 mg | Freq: Once | INTRAVENOUS | Status: AC
  Administered 2018-12-25: 500 mg via INTRAVENOUS
  Filled 2018-12-25: qty 500

## 2018-12-25 MED ORDER — FUROSEMIDE 10 MG/ML IJ SOLN
40.0000 mg | Freq: Two times a day (BID) | INTRAMUSCULAR | Status: DC
  Administered 2018-12-25 – 2019-01-01 (×15): 40 mg via INTRAVENOUS
  Filled 2018-12-25 (×15): qty 4

## 2018-12-25 MED ORDER — METOPROLOL TARTRATE 25 MG PO TABS
25.0000 mg | ORAL_TABLET | Freq: Two times a day (BID) | ORAL | Status: DC
  Administered 2018-12-25 – 2018-12-26 (×4): 25 mg via ORAL
  Filled 2018-12-25 (×4): qty 1

## 2018-12-25 MED ORDER — ONDANSETRON HCL 4 MG PO TABS: 4.0000 mg | ORAL_TABLET | Freq: Four times a day (QID) | ORAL | Status: DC | PRN

## 2018-12-25 MED ORDER — ACETAMINOPHEN 325 MG PO TABS
650.0000 mg | ORAL_TABLET | Freq: Four times a day (QID) | ORAL | Status: DC | PRN
  Administered 2018-12-25 – 2018-12-28 (×3): 650 mg via ORAL
  Filled 2018-12-25 (×3): qty 2

## 2018-12-25 MED ORDER — FINASTERIDE 5 MG PO TABS
5.0000 mg | ORAL_TABLET | Freq: Every day | ORAL | Status: DC
  Administered 2018-12-25 – 2019-01-02 (×8): 5 mg via ORAL
  Filled 2018-12-25 (×8): qty 1

## 2018-12-25 MED ORDER — PANTOPRAZOLE SODIUM 40 MG PO TBEC
40.0000 mg | DELAYED_RELEASE_TABLET | Freq: Every day | ORAL | Status: DC
  Administered 2018-12-25 – 2019-01-02 (×8): 40 mg via ORAL
  Filled 2018-12-25 (×8): qty 1

## 2018-12-25 MED ORDER — ROSUVASTATIN CALCIUM 5 MG PO TABS
5.0000 mg | ORAL_TABLET | Freq: Every day | ORAL | Status: DC
  Administered 2018-12-25 – 2019-01-01 (×8): 5 mg via ORAL
  Filled 2018-12-25 (×8): qty 1

## 2018-12-25 MED ORDER — SODIUM CHLORIDE 0.9 % IV SOLN
1.0000 g | Freq: Once | INTRAVENOUS | Status: AC
  Administered 2018-12-25: 1 g via INTRAVENOUS
  Filled 2018-12-25: qty 10

## 2018-12-25 MED ORDER — SODIUM CHLORIDE 0.9 % IV SOLN: 250.0000 mL | INTRAVENOUS | Status: DC | PRN

## 2018-12-25 NOTE — ED Notes (Signed)
Introduced self to pt. Replaced o2 and pox sensor. Gave pt warm blanket. NAD.

## 2018-12-25 NOTE — Progress Notes (Signed)
Patient daughter (HCPOA),Dr. [name], was called and updated this evening by this RN. Permission to have her brother/patient son, Blake [name]. To visit with patient tomorrow due to patient mental instability, and upcoming procedure for nephrostomy tube placement.

## 2018-12-25 NOTE — ED Triage Notes (Addendum)
EMS pt to rm 19 from home with report of increasing shortness of breath over the last few days and increasing weakness. RA O2 sats 89% for EMS placed on O2 at 2l/min via [place] by EMS. Pt has pacemaker.

## 2018-12-25 NOTE — ED Notes (Addendum)
As per conversation with Lazarus Salines, [place], [phone], pt's daughter: pt is full code and want to be resuscitated

## 2018-12-25 NOTE — ED Provider Notes (Signed)
[facility] Emergency Department Provider Note   ____________________________________________   First MD Initiated Contact with Patient 12/25/18 [phone]     (approximate)  I have reviewed the triage vital signs and the nursing notes.   HISTORY  Chief Complaint Respiratory Distress  Level V caveat: Limited by distress  HPI Blake [name] is a 85 y.o. male brought to the ED via EMS from home with a chief complaint of shortness of breath and increasing weakness.  Patient with a history of diabetes, hypertension who his daughter reports a several day history of increasing shortness of breath.  Worsening indwelling Foley catheter which appears cloudy.  Patient is limited giving history secondary to respiratory distress.       Past Medical History:  Diagnosis Date  . AAA (abdominal aortic aneurysm) ([place])   . Arrhythmia   . Diabetes mellitus without complication ([place])   . GI bleed   . Heart murmur   . Hyperlipidemia   . Hypertension   . Pacemaker   . Prostate cancer [facility])     Patient Active Problem List   Diagnosis Date Noted  . Acute renal failure (ARF) ([place]) 11/21/2018  . AKI (acute kidney injury) ([place])   . Goals of care, counseling/discussion   . Palliative care by specialist   . DNR (do not resuscitate) discussion   . Seizure-like activity ([place]) 11/05/2018  . Allergic rhinitis 07/24/2018  . Cardiac pacemaker in situ 07/24/2018  . History of sleep apnea 07/24/2018  . Macular degeneration 07/24/2018  . Trigeminal neuralgia pain 01/11/2018  . TMJ pain dysfunction syndrome 01/11/2018  . Recurrent UTI 09/26/2017  . Urothelial carcinoma of bladder ([place]) 09/26/2017  . Pressure injury of skin 09/12/2017  . SIRS (systemic inflammatory response syndrome) ([place]) 09/10/2017  . Chronic cystitis 08/25/2017  . Sepsis ([place]) 06/10/2017  . CAP (community acquired pneumonia) 06/10/2017  . Infection due to enterococcus 06/07/2017  . UTI (urinary tract  infection) 05/17/2017  . Blood in stool   . Hemorrhage of rectum and anus   . Lower GI bleed 10/20/2016  . Near syncope   . Acute renal failure superimposed on chronic kidney disease ([place])   . Pneumonia 10/09/2016  . Aspiration pneumonia ([place]) 10/09/2016  . Acute respiratory failure ([place]) 10/09/2016  . Hydronephrosis, bilateral 07/19/2016  . Spinal stenosis of lumbar region with neurogenic claudication 07/19/2016  . AAA (abdominal aortic aneurysm) without rupture ([place]) 04/27/2016  . Arthritis, degenerative 03/31/2016  . A-fib ([place]) 03/31/2016  . HLD (hyperlipidemia) 03/31/2016  . CA of prostate ([place]) 03/31/2016  . Central perforation of tympanic membrane of right ear 03/26/2016  . 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 Presbycusis of left ear with restricted hearing of right ear 03/01/2016  . Chronic kidney disease, stage 3 (moderate) ([place]) 10/30/2015  . Episode of unresponsiveness 10/30/2015  . Sick sinus syndrome ([place]) 10/30/2015  . Malignant neoplasm of overlapping sites of bladder ([place]) 09/26/2015  . Urinary retention 09/17/2015  . Bladder neoplasm of uncertain malignant potential 08/13/2015  . Thrombocytopenia, unspecified ([place]) 07/24/2015  . Fitting or adjustment of cardiac pacemaker 05/09/2015  . SVT (supraventricular tachycardia) ([place]) 04/09/2015  . Diabetes mellitus, type 2 ([place]) 04/09/2015  . Benign hypertension 04/09/2015  . Acute myocardial infarction, initial episode of care ([place]) 04/09/2015  . Phimosis 01/30/2014  . Bladder outlet obstruction 04/18/2013  . Dysuria 04/18/2013  . Gross hematuria 04/18/2013  . Urethral valve, acquired 04/18/2013  .  Incomplete emptying of bladder 11/26/2012  . Personal history of prostate cancer 11/26/2012    Past Surgical History:  Procedure Laterality Date  . FLEXIBLE SIGMOIDOSCOPY N/A 10/21/2016   Procedure: FLEXIBLE SIGMOIDOSCOPY;  Surgeon: [name], MD;   Location: [facility] ENDOSCOPY;  Service: Endoscopy;  Laterality: N/A;  . JOINT REPLACEMENT    . PACEMAKER INSERTION Left 04/14/2015   Procedure: INSERTION PACEMAKER;  Surgeon: Isaias Cowman, MD;  Location: [facility] ORS;  Service: Cardiovascular;  Laterality: Left;  . PROSTATE SURGERY    . VISCERAL ARTERY INTERVENTION N/A 10/22/2016   Procedure: Visceral Artery Intervention;  Surgeon: Algernon Huxley, MD;  Location: [facility];  Service: Cardiovascular;  Laterality: N/A;    Prior to Admission medications   Medication Sig Start Date End Date Taking? Authorizing Provider  amLODipine (NORVASC) 2.5 MG tablet Take 1 tablet (2.5 mg total) by mouth daily. 11/10/18   Hillary Bow, MD  cefTAZidime (FORTAZ) IVPB Inject 1 g into the vein daily. Indication: pseudomonas UTI Last Day of Therapy:  12/03/2018 Once weekly: CBC/D, BMP 11/29/18   Dustin Flock, MD  finasteride (PROSCAR) 5 MG tablet Take 5 mg by mouth daily.    [provider]  Iron-Vitamin C (VITRON-C PO) Take 1 tablet by mouth daily.    [provider]  metoprolol tartrate 37.5 MG TABS Take 37.5 mg by mouth 2 (two) times daily. Patient taking differently: Take 25 mg by mouth 2 (two) times daily.  09/13/17   Gladstone Lighter, MD  Multiple Vitamins-Minerals (PRESERVISION/LUTEIN PO) Take 1 capsule by mouth 2 (two) times daily.    [provider]  multivitamin-iron-minerals-folic acid (CENTRUM) chewable tablet Chew 2 tablets by mouth daily.    [provider]  pantoprazole (PROTONIX) 40 MG tablet Take 40 mg by mouth daily.    [provider]  rosuvastatin (CRESTOR) 5 MG tablet Take 5 mg by mouth at bedtime.    [provider]  sitaGLIPtin (JANUVIA) 50 MG tablet Take 50 mg by mouth daily.    [provider]  sodium bicarbonate 650 MG tablet Take 1 tablet (650 mg total) by mouth 2 (two) times daily. 09/13/17   Gladstone Lighter, MD  tamsulosin (FLOMAX) 0.4 MG CAPS capsule Take 0.4 mg by  mouth daily.     [provider]    Allergies Diovan [valsartan], Gabapentin, Hydralazine, Lisinopril, Pregabalin, Levofloxacin, and Sulfamethoxazole-trimethoprim  Family History  Problem Relation Age of Onset  . Hypertension Other   . Stroke Other   . Heart attack Other   . Stroke Sister   . Heart attack Brother     Social History Social History   Tobacco Use  . Smoking status: Former Research scientist (life sciences)  . Smokeless tobacco: Never Used  Substance Use Topics  . Alcohol use: No  . Drug use: No    Review of Systems  Constitutional: No fever/chills Eyes: No visual changes. ENT: No sore throat. Cardiovascular: Denies chest pain. Respiratory: Positive for cough and shortness of breath. Gastrointestinal: No abdominal pain.  No nausea, no vomiting.  No diarrhea.  No constipation. Genitourinary: Negative for dysuria. Musculoskeletal: Negative for back pain. Skin: Negative for rash. Neurological: Negative for headaches, focal weakness or numbness.   ____________________________________________   PHYSICAL EXAM:  VITAL SIGNS: ED Triage Vitals  Enc Vitals Group     BP --      Pulse --      Resp --      Temp --      Temp src --  SpO2 --      Weight 12/25/18 0607 180 lb (81.6 kg)     Height 12/25/18 0607 6\' 2"  (1.88 m)     Head Circumference --      Peak Flow --      Pain Score 12/25/18 0604 9     Pain Loc --      Pain Edu? --      Excl. in [place]? --     Constitutional: Alert and oriented.  Elderly appearing and in moderate acute distress. Eyes: Conjunctivae are normal. PERRL. EOMI. Head: Atraumatic. Nose: No congestion/rhinnorhea. Mouth/Throat: Mucous membranes are moist.  Oropharynx non-erythematous. Neck: No stridor.   Cardiovascular: Normal rate, regular rhythm. Grossly normal heart sounds.  Good peripheral circulation. Respiratory: Increased respiratory effort.  No retractions. Lungs with rhonchi.  Use, rattling cough. Gastrointestinal: Soft and nontender.  No distention. No abdominal bruits. No CVA tenderness. Genitourinary: Indwelling Foley catheter with cloudy looking urine. Musculoskeletal: No lower extremity tenderness nor edema.  No joint effusions. Neurologic:  Normal speech and language. No gross focal neurologic deficits are appreciated.  Skin:  Skin is warm, dry and intact. No rash noted.  No petechiae. Psychiatric: Mood and affect are normal. Speech and behavior are normal.  ____________________________________________   LABS (all labs ordered are listed, but only abnormal results are displayed)  Labs Reviewed  COMPREHENSIVE METABOLIC PANEL - Abnormal; Notable for the following components:      Result Value   Glucose, Bld 151 (*)    BUN 70 (*)    Creatinine, Ser 5.21 (*)    Calcium 8.2 (*)    Albumin 3.0 (*)    GFR calc non Af Amer 9 (*)    GFR calc Af Amer 10 (*)    All other components within normal limits  TROPONIN I - Abnormal; Notable for the following components:   Troponin I 0.06 (*)    All other components within normal limits  CULTURE, BLOOD (ROUTINE X 2)  CULTURE, BLOOD (ROUTINE X 2)  URINE CULTURE  SARS CORONAVIRUS 2 (HOSPITAL ORDER, [place])  LACTIC ACID, PLASMA  CBC WITH DIFFERENTIAL/PLATELET  BRAIN NATRIURETIC PEPTIDE  LACTIC ACID, PLASMA  URINALYSIS, COMPLETE (UACMP) WITH MICROSCOPIC   ____________________________________________  EKG  ED ECG REPORT I, SUNG,JADE J, the attending physician, personally viewed and interpreted this ECG.   Date: 12/25/2018  EKG Time: 0635  Rate: 73  Rhythm: normal EKG, normal sinus rhythm  Axis: Normal  Intervals:right bundle branch block  ST&T Change: Nonspecific  ____________________________________________  RADIOLOGY  ED MD interpretation: Pending  Official radiology report(s): No results found.  ____________________________________________   PROCEDURES  Procedure(s) performed (including Critical Care):  Procedures   CRITICAL CARE Performed by: Paulette Blanch   Total critical care time: 30 minutes  Critical care time was exclusive of separately billable procedures and treating other patients.  Critical care was necessary to treat or prevent imminent or life-threatening deterioration.  Critical care was time spent personally by me on the following activities: development of treatment plan with patient and/or surrogate as well as nursing, discussions with consultants, evaluation of patient's response to treatment, examination of patient, obtaining history from patient or surrogate, ordering and performing treatments and interventions, ordering and review of laboratory studies, ordering and review of radiographic studies, pulse oximetry and re-evaluation of patient's condition. ____________________________________________   INITIAL IMPRESSION / ASSESSMENT AND PLAN / ED COURSE  As part of my medical decision making, I reviewed the following data within the  electronic MEDICAL RECORD NUMBER Nursing notes reviewed and incorporated, Labs reviewed, EKG interpreted, Old chart reviewed, Radiograph reviewed and Notes from prior ED visits     GAY RAPE was evaluated in Emergency Department on 12/25/2018 for the symptoms described in the history of present illness. He was evaluated in the context of the global COVID-19 pandemic, which necessitated consideration that the patient might be at risk for infection with the SARS-CoV-2 virus that causes COVID-19. Institutional protocols and algorithms that pertain to the evaluation of patients at risk for COVID-19 are in a state of rapid change based on information released by regulatory bodies including the CDC and federal and state organizations. These policies and algorithms were followed during the patient's care in the ED.   85 year old male who presents with cough, shortness of breath and increasing weakness.  Differential diagnosis includes but is not limited to  community-acquired pneumonia, sepsis, COVID-19, CHF, CAD.  Will obtain septic work-up, COVID swab.  Anticipate hospitalization.   Clinical Course as of Dec 24 704  Mon Dec 25, 2018  0645 Daughter is [facility] POA and states patient is full code.   [JS]  [id number] Care transferred to Dr. Cinda Quest at change of shift.  Lactic acid unremarkable.  Rest of lab work pending.  Did see recent urology notes recommending bilateral nephrostomy tubes secondary to increasing creatinine due to bilateral hydronephrosis.  May consider CT renal colic study if creatinine is worse and patient has UTI.  If Covid negative, patient to be hospitalized here.  Otherwise will require transfer.   [JS]    Clinical Course User Index [JS] Paulette Blanch, MD     ____________________________________________   FINAL CLINICAL IMPRESSION(S) / ED DIAGNOSES  Final diagnoses:  Respiratory distress  Hypoxia     ED Discharge Orders    None       Note:  This document was prepared using Dragon voice recognition software and may include unintentional dictation errors.   Paulette Blanch, MD 12/25/18 [phone]

## 2018-12-25 NOTE — ED Notes (Signed)
pts family updated by Dr Cinda Quest. Family aware of pending admission

## 2018-12-25 NOTE — Plan of Care (Signed)
  Problem: Education: Goal: Knowledge of General Education information will improve Description: Including pain rating scale, medication(s)/side effects and non-pharmacologic comfort measures Outcome: Progressing   Problem: Clinical Measurements: Goal: Ability to maintain clinical measurements within normal limits will improve Outcome: Progressing   Problem: Nutrition: Goal: Adequate nutrition will be maintained Outcome: Progressing   Problem: Safety: Goal: Ability to remain free from injury will improve Outcome: Progressing   Problem: Education: Goal: Knowledge of General Education information will improve Description: Including pain rating scale, medication(s)/side effects and non-pharmacologic comfort measures Outcome: Progressing   Problem: Clinical Measurements: Goal: Ability to maintain clinical measurements within normal limits will improve Outcome: Progressing   Problem: Nutrition: Goal: Adequate nutrition will be maintained Outcome: Progressing   Problem: Safety: Goal: Ability to remain free from injury will improve Outcome: Progressing

## 2018-12-25 NOTE — ED Notes (Signed)
ED TO INPATIENT HANDOFF REPORT  ED Nurse Name and Phone #: [phone]    S Name/Age/Gender Blake [name] 85 y.o. male Room/Bed: ED19A/ED19A  Code Status   Code Status: Full Code  Home/SNF/Other Home Patient oriented to: self and place Is this baseline? Yes   Triage Complete: Triage complete  Chief Complaint [facility] - Difficulty breathing  Triage Note EMS pt to rm 19 from home with report of increasing shortness of breath over the last few days and increasing weakness. RA O2 sats 89% for EMS placed on O2 at 2l/min via [place] by EMS. Pt has pacemaker.    Allergies Allergies  Allergen Reactions  . Diovan [Valsartan] Cough  . Gabapentin Other (See Comments)    Sedation at all doses  . Hydralazine Itching  . Lisinopril Cough  . Pregabalin Other (See Comments)    Sedation at all doses  . Levofloxacin Other (See Comments)    Too many side effects. Nausea, vomiting, upset stomach, increased confusion, etc Other reaction(s): Confusion Nausea, chills Nausea, chills   . Sulfamethoxazole-Trimethoprim Other (See Comments)    Too many side effects. Nausea, vomiting, upset stomach, increased confusion, etc    Level of Care/Admitting Diagnosis ED Disposition    ED Disposition Condition [place]: [place] [[id number]]  Level of Care: Telemetry [5]  Covid Evaluation: Confirmed COVID Negative  Diagnosis: Acute CHF (congestive heart failure) [facility]) [[id number]]  Admitting Physician: Dustin Flock [[id number]]  Attending Physician: Dustin Flock [[id number]]  Estimated length of stay: past midnight tomorrow  Certification:: I certify this patient will need inpatient services for at least 2 midnights  PT Class (Do Not Modify): Inpatient [101]  PT Acc Code (Do Not Modify): Private [1]       B Medical/Surgery History Past Medical History:  Diagnosis Date  . AAA (abdominal aortic aneurysm) ([place])   . Arrhythmia   . Diabetes mellitus without  complication ([place])   . GI bleed   . Heart murmur   . Hyperlipidemia   . Hypertension   . Pacemaker   . Prostate cancer [facility])    Past Surgical History:  Procedure Laterality Date  . FLEXIBLE SIGMOIDOSCOPY N/A 10/21/2016   Procedure: FLEXIBLE SIGMOIDOSCOPY;  Surgeon: [name], MD;  Location: [facility] ENDOSCOPY;  Service: Endoscopy;  Laterality: N/A;  . JOINT REPLACEMENT    . PACEMAKER INSERTION Left 04/14/2015   Procedure: INSERTION PACEMAKER;  Surgeon: Isaias Cowman, MD;  Location: [facility] ORS;  Service: Cardiovascular;  Laterality: Left;  . PROSTATE SURGERY    . VISCERAL ARTERY INTERVENTION N/A 10/22/2016   Procedure: Visceral Artery Intervention;  Surgeon: Algernon Huxley, MD;  Location: [place] CV LAB;  Service: Cardiovascular;  Laterality: N/A;     A IV Location/Drains/Wounds Patient Lines/Drains/Airways Status   Active Line/Drains/Airways    Name:   Placement date:   Placement time:   Site:   Days:   Peripheral IV 12/25/18 Left Antecubital   12/25/18    0600    Antecubital   less than 1   Peripheral IV 12/25/18 Right Forearm   12/25/18    0610    Forearm   less than 1   Midline Single Lumen 11/28/18 Midline Left Brachial 8 cm 0 cm   11/28/18    1725    Brachial   27   Urethral Catheter Latex 16 Fr.   11/21/18    2100    Latex   34   Pressure Injury 11/21/18  Stage I -  Intact skin with non-blanchable redness of a localized area usually over a bony prominence. pink   11/21/18    2350     34          Intake/Output Last 24 hours  Intake/Output Summary (Last 24 hours) at 12/25/2018 0934 Last data filed at 12/25/2018 0934 Gross per 24 hour  Intake -  Output 350 ml  Net -350 ml    Labs/Imaging Results for orders placed or performed during the hospital encounter of 12/25/18 (from the past 48 hour(s))  CBC with Differential     Status: Abnormal   Collection Time: 12/25/18  6:15 AM  Result Value Ref Range   WBC 8.5 4.0 - 10.5 K/uL   RBC 3.15 (L) 4.22 - 5.81 MIL/uL    Hemoglobin 8.3 (L) 13.0 - 17.0 g/dL   HCT 27.8 (L) 39.0 - 52.0 %   MCV 88.3 80.0 - 100.0 fL   MCH 26.3 26.0 - 34.0 pg   MCHC 29.9 (L) 30.0 - 36.0 g/dL   RDW 16.6 (H) 11.5 - 15.5 %   Platelets 128 (L) 150 - 400 K/uL    Comment: Immature Platelet Fraction may be clinically indicated, consider ordering this additional test [id number]    nRBC 0.0 0.0 - 0.2 %   Neutrophils Relative % 81 %   Neutro Abs 6.9 1.7 - 7.7 K/uL   Lymphocytes Relative 10 %   Lymphs Abs 0.8 0.7 - 4.0 K/uL   Monocytes Relative 5 %   Monocytes Absolute 0.5 0.1 - 1.0 K/uL   Eosinophils Relative 2 %   Eosinophils Absolute 0.1 0.0 - 0.5 K/uL   Basophils Relative 1 %   Basophils Absolute 0.0 0.0 - 0.1 K/uL   Immature Granulocytes 1 %   Abs Immature Granulocytes 0.11 (H) 0.00 - 0.07 K/uL    Comment: Performed at [facility], [address] [place], [place] [address]  Comprehensive metabolic panel     Status: Abnormal   Collection Time: 12/25/18  6:15 AM  Result Value Ref Range   Sodium 139 135 - 145 mmol/L   Potassium 5.1 3.5 - 5.1 mmol/L   Chloride 102 98 - 111 mmol/L   CO2 23 22 - 32 mmol/L   Glucose, Bld 151 (H) 70 - 99 mg/dL   BUN 70 (H) 8 - 23 mg/dL   Creatinine, Ser 5.21 (H) 0.61 - 1.24 mg/dL   Calcium 8.2 (L) 8.9 - 10.3 mg/dL   Total Protein 7.8 6.5 - 8.1 g/dL   Albumin 3.0 (L) 3.5 - 5.0 g/dL   AST 16 15 - 41 U/L   ALT 18 0 - 44 U/L   Alkaline Phosphatase 67 38 - 126 U/L   Total Bilirubin 0.5 0.3 - 1.2 mg/dL   GFR calc non Af Amer 9 (L) >60 mL/min   GFR calc Af Amer 10 (L) >60 mL/min   Anion gap 14 5 - 15    Comment: Performed at [facility], [address], [place], [place] [address]  Brain natriuretic peptide     Status: Abnormal   Collection Time: 12/25/18  6:15 AM  Result Value Ref Range   B Natriuretic Peptide 1,226.0 (H) 0.0 - 100.0 pg/mL    Comment: Performed at [facility], [address] [place], [place] [address]  Troponin I - Once     Status: Abnormal    Collection Time: 12/25/18  6:15 AM  Result Value Ref Range   Troponin  I 0.06 (HH) <0.03 ng/mL    Comment: CRITICAL RESULT CALLED TO, READ BACK BY AND VERIFIED WITH JEN MILLER 12/25/2018 0703 [facility] Performed at [facility], [address] [place], [place] [address]   Lactic acid, plasma     Status: None   Collection Time: 12/25/18  6:15 AM  Result Value Ref Range   Lactic Acid, Venous 1.3 0.5 - 1.9 mmol/L    Comment: Performed at [facility], [address] [place], [place] [address]  Urinalysis, Complete w Microscopic     Status: Abnormal   Collection Time: 12/25/18  6:15 AM  Result Value Ref Range   Color, Urine YELLOW (A) YELLOW   APPearance TURBID (A) CLEAR   Specific Gravity, Urine 1.010 1.005 - 1.030   pH 6.0 5.0 - 8.0   Glucose, UA NEGATIVE NEGATIVE mg/dL   Hgb urine dipstick SMALL (A) NEGATIVE   Bilirubin Urine NEGATIVE NEGATIVE   Ketones, ur NEGATIVE NEGATIVE mg/dL   Protein, ur 30 (A) NEGATIVE mg/dL   Nitrite NEGATIVE NEGATIVE   Leukocytes,Ua LARGE (A) NEGATIVE   RBC / HPF 21-50 0 - 5 RBC/hpf   WBC, UA >50 (H) 0 - 5 WBC/hpf   Bacteria, UA NONE SEEN NONE SEEN   Squamous Epithelial / LPF 6-10 0 - 5   WBC Clumps PRESENT    Non Squamous Epithelial PRESENT (A) NONE SEEN    Comment: Performed at [facility], [address] [place], [place] [address]  SARS Coronavirus 2 (CEPHEID- Performed in [place] hospital lab), Hosp Order     Status: None   Collection Time: 12/25/18  6:15 AM   Specimen: Nasopharyngeal  Result Value Ref Range   SARS Coronavirus 2 NEGATIVE NEGATIVE    Comment: (NOTE) If result is NEGATIVE SARS-CoV-2 target nucleic acids are NOT DETECTED. The SARS-CoV-2 RNA is generally detectable in upper and lower  respiratory specimens during the acute phase of infection. The lowest  concentration of SARS-CoV-2 viral copies this assay can detect is 250  copies / mL. A negative result does not preclude SARS-CoV-2 infection  and  should not be used as the sole basis for treatment or other  patient management decisions.  A negative result may occur with  improper specimen collection / handling, submission of specimen other  than nasopharyngeal swab, presence of viral mutation(s) within the  areas targeted by this assay, and inadequate number of viral copies  (<250 copies / mL). A negative result must be combined with clinical  observations, patient history, and epidemiological information. If result is POSITIVE SARS-CoV-2 target nucleic acids are DETECTED. The SARS-CoV-2 RNA is generally detectable in upper and lower  respiratory specimens dur ing the acute phase of infection.  Positive  results are indicative of active infection with SARS-CoV-2.  Clinical  correlation with patient history and other diagnostic information is  necessary to determine patient infection status.  Positive results do  not rule out bacterial infection or co-infection with other viruses. If result is PRESUMPTIVE POSTIVE SARS-CoV-2 nucleic acids MAY BE PRESENT.   A presumptive positive result was obtained on the submitted specimen  and confirmed on repeat testing.  While 2019 novel coronavirus  (SARS-CoV-2) nucleic acids may be present in the submitted sample  additional confirmatory testing may be necessary for epidemiological  and / or clinical management purposes  to differentiate between  SARS-CoV-2 and other Sarbecovirus currently known to infect humans.  If clinically indicated additional testing with an alternate test  methodology [phone])  is advised. The SARS-CoV-2 RNA is generally  detectable in upper and lower respiratory sp ecimens during the acute  phase of infection. The expected result is Negative. Fact Sheet for Patients:  StrictlyIdeas.no Fact Sheet for Healthcare Providers: BankingDealers.co.za This test is not yet approved or cleared by the [place] FDA and has been  authorized for detection and/or diagnosis of SARS-CoV-2 by FDA under an Emergency Use Authorization (EUA).  This EUA will remain in effect (meaning this test can be used) for the duration of the COVID-19 declaration under Section 564(b)(1) of the Act, 21 U.S.C. section 360bbb-3(b)(1), unless the authorization is terminated or revoked sooner. Performed at [facility], [address] [place], [place] [address]    Dg Chest Port 1 View  Result Date: 12/25/2018 CLINICAL DATA:  Shortness of breath. EXAM: PORTABLE CHEST 1 VIEW COMPARISON:  11/05/2018. FINDINGS: Cardiac pacer noted with lead tip over the right atrium right ventricle. Heart size stable. Bibasilar infiltrates/edema and small bilateral pleural effusions. Bibasilar atelectasis. No pneumothorax. IMPRESSION: 1.  Cardiac pacer stable position. 2. Bibasilar infiltrates/edema and small bilateral pleural effusions. Findings have increased from prior exam. Electronically Signed   By: Marcello Moores  Register   On: 12/25/2018 07:07    Pending Labs Unresulted Labs (From admission, onward)    Start     Ordered   12/25/18 0911  Procalcitonin - Baseline  ONCE - STAT,   STAT     12/25/18 0910   12/25/18 0622  Culture, blood (routine x 2)  BLOOD CULTURE X 2,   STAT     12/25/18 0622   12/25/18 0622  Lactic acid, plasma  Now then every 2 hours,   STAT     12/25/18 0622   12/25/18 0622  Urine culture  Once,   STAT     12/25/18 0622   Signed and Held  CBC  (heparin)  Once,   R    Comments: Baseline for heparin therapy IF NOT ALREADY DRAWN.  Notify MD if PLT < 100 K.    Signed and Held   Signed and Held  Creatinine, serum  (heparin)  Once,   R    Comments: Baseline for heparin therapy IF NOT ALREADY DRAWN.    Signed and Held   Signed and Held  Urine culture  Once,   R     Signed and Held   Signed and Held  CBC  Tomorrow morning,   R     Signed and Held   Signed and Held  Basic metabolic panel  Tomorrow morning,   R     Signed and Held           Vitals/Pain Today's Vitals   12/25/18 0604 12/25/18 0607 12/25/18 0639 12/25/18 0700  BP:   (!) 156/100 (!) 161/99  Pulse:   72   Resp:   (!) 28 (!) 27  Temp:   98.4 F (36.9 C)   TempSrc:   Rectal   SpO2:   100%   Weight:  81.6 kg    Height:  6\' 2"  (1.88 m)    PainSc: 9   9      Isolation Precautions Droplet and Contact precautions  Medications Medications  azithromycin (ZITHROMAX) 500 mg in sodium chloride 0.9 % 250 mL IVPB (500 mg Intravenous New Bag/Given 12/25/18 0929)  furosemide (LASIX) injection 40 mg (has no administration in time range)  insulin aspart (novoLOG) injection 0-9 Units (has no administration in time range)  insulin aspart (novoLOG)  injection 0-5 Units (has no administration in time range)  cefTRIAXone (ROCEPHIN) 1 g in sodium chloride 0.9 % 100 mL IVPB (has no administration in time range)  azithromycin (ZITHROMAX) 500 mg in sodium chloride 0.9 % 250 mL IVPB (has no administration in time range)  furosemide (LASIX) injection 20 mg (20 mg Intravenous Given 12/25/18 0825)  cefTRIAXone (ROCEPHIN) 1 g in sodium chloride 0.9 % 100 mL IVPB (1 g Intravenous New Bag/Given 12/25/18 0851)    Mobility unknown High fall risk   Focused Assessments Pulmonary Assessment Handoff:  Lung sounds: Bilateral Breath Sounds: Rhonchi, Diminished O2 Device: Nasal Cannula O2 Flow Rate (L/min): 3 L/min      R Recommendations: See Admitting Provider Note  Report given to:   Additional Notes:

## 2018-12-25 NOTE — TOC Initial Note (Addendum)
Transition of Care [facility]) - Initial/Assessment Note    Patient Details  Name: Blake [name] MRN: [id number] Date of Birth: 09-04-1922  Transition of Care [facility]) CM/SW Contact:    Marshell Garfinkel, RN Phone Number: 12/25/2018, 10:08 AM  Clinical Narrative:                 ED RNCM originally spoke with patient's wife by phone however quickly determined that she is not able to make healthcare decisions.  Her caregiver in the background seemed to be abrasive in communicating with her. I later learned that the "caretaker is a friend"- from daughter Dr. Primus Bravo [phone]. Per daughter Dr. Primus Bravo, her mother has dementia and becomes anxious with questions.  FYI placed in EMR regarding this information. Dr. Primus Bravo states patient has a urinary foley but hopes that nephrostomy can be this visit.  He is able to get to PCP office Dr. Caryl Comes via car. He uses a rollator and assistance from one person to ambulate. He has a wheelchair, rollator, and lift chair available at home. Dr. Primus Bravo is not aware of any other needs at this time. Patient no longer has home health services but has private caregiver. She would like for him to have home health services again at discharge. Corene Cornea with [facility] updated. Update at 1038: notified daughter Dr. Primus Bravo of room assignment 74 and unit [facility] team names.   Expected Discharge Plan: [place]     Patient Goals and CMS Choice Patient states their goals for this hospitalization and ongoing recovery are:: Return to home followed by [facility] CMS Medicare.gov Compare Post Acute Care list provided to:: Patient Represenative (must comment)(Daughter [name]) Choice offered to / list presented to : Adult Children(Rosalyn Crisp DDS [phone])  Expected Discharge Plan and Services Expected Discharge Plan: [place] In-house Referral: Clinical Social Work Discharge Planning Services: CM Consult Post Acute Care Choice:  [place] arrangements for the past 2 months: [place]: RN, PT, Nurse's Aide, Social Work [employer] Agency: [place] ([place]) Date [place]: 12/25/18 Time [place]: 1000 Representative spoke with at [place]: Floydene Flock  Prior Living Arrangements/Services Living arrangements for the past 2 months: [place] with:: Adult Children, Spouse              Current home services: Home PT, Home RN, Homehealth aide    Activities of Daily Living      Permission Sought/Granted                  Emotional Assessment              Admission diagnosis:  [facility] - Difficulty breathing Patient Active Problem List   Diagnosis Date Noted  . Acute CHF (congestive heart failure) ([place]) 12/25/2018  . Acute renal failure (ARF) ([place]) 11/21/2018  . AKI (acute kidney injury) ([place])   . Goals of care, counseling/discussion   . Palliative care by specialist   . DNR (do not resuscitate) discussion   . Seizure-like activity ([place]) 11/05/2018  . Allergic rhinitis 07/24/2018  . Cardiac pacemaker in situ 07/24/2018  . History of sleep apnea 07/24/2018  . Macular degeneration 07/24/2018  . Trigeminal neuralgia pain 01/11/2018  . TMJ pain dysfunction syndrome 01/11/2018  .  Recurrent UTI 09/26/2017  . Urothelial carcinoma of bladder ([place]) 09/26/2017  . Pressure injury of skin 09/12/2017  . SIRS (systemic inflammatory response syndrome) ([place]) 09/10/2017  . Chronic cystitis 08/25/2017  . Sepsis ([place]) 06/10/2017  . CAP (community acquired pneumonia) 06/10/2017  . Infection due to enterococcus 06/07/2017  . UTI (urinary tract infection) 05/17/2017  . Blood in stool   . Hemorrhage of rectum and anus   . Lower GI bleed 10/20/2016  . Near syncope   . Acute renal failure superimposed on chronic kidney disease ([place])   . Pneumonia 10/09/2016  . Aspiration pneumonia ([place]) 10/09/2016  .  Acute respiratory failure ([place]) 10/09/2016  . Hydronephrosis, bilateral 07/19/2016  . Spinal stenosis of lumbar region with neurogenic claudication 07/19/2016  . AAA (abdominal aortic aneurysm) without rupture ([place]) 04/27/2016  . Arthritis, degenerative 03/31/2016  . A-fib ([place]) 03/31/2016  . HLD (hyperlipidemia) 03/31/2016  . CA of prostate ([place]) 03/31/2016  . Central perforation of tympanic membrane of right ear 03/26/2016  . 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 Presbycusis of left ear with restricted hearing of right ear 03/01/2016  . Chronic kidney disease, stage 3 (moderate) ([place]) 10/30/2015  . Episode of unresponsiveness 10/30/2015  . Sick sinus syndrome ([place]) 10/30/2015  . Malignant neoplasm of overlapping sites of bladder ([place]) 09/26/2015  . Urinary retention 09/17/2015  . Bladder neoplasm of uncertain malignant potential 08/13/2015  . Thrombocytopenia, unspecified ([place]) 07/24/2015  . Fitting or adjustment of cardiac pacemaker 05/09/2015  . SVT (supraventricular tachycardia) ([place]) 04/09/2015  . Diabetes mellitus, type 2 ([place]) 04/09/2015  . Benign hypertension 04/09/2015  . Acute myocardial infarction, initial episode of care ([place]) 04/09/2015  . Phimosis 01/30/2014  . Bladder outlet obstruction 04/18/2013  . Dysuria 04/18/2013  . Gross hematuria 04/18/2013  . Urethral valve, acquired 04/18/2013  . Incomplete emptying of bladder 11/26/2012  . Personal history of prostate cancer 11/26/2012   PCP:  Adin Hector, MD Pharmacy:   [facility] [place] [address] Phone: [phone] Fax: [place], [place] - [address] [place] [address] Phone: [phone] Fax: [phone]  [place], [place] - [place] [address] Phone: [phone] Fax: [phone]  [id number] [place], [place] [address] Phone: [phone] Fax: [phone]     Social Determinants of Health (SDOH) Interventions    Readmission Risk Interventions Readmission Risk Prevention Plan 12/25/2018 11/24/2018 11/09/2018  Transportation Screening Complete Complete Complete  PCP or Specialist Appt within 3-5 Days - Complete Complete  HRI or Home Care Consult Complete Complete Complete  Social Work Consult for [place] Planning/Counseling Complete (No Data) -  Palliative Care Screening - Not Applicable -  Medication Review (RN Care Manager) Complete Complete Complete  Some recent data might be hidden

## 2018-12-25 NOTE — Progress Notes (Signed)
[facility]  ROUNDING NOTE   Subjective:   Blake [name] admitted to [facility] on 12/25/2018 for Hydronephrosis [N13.30] Respiratory distress [R06.03] SOB (shortness of breath) [R06.02] Hypoxia [R09.02]   Objective:  Vital signs in last 24 hours:  Temp:  [97.8 F (36.6 C)-98.4 F (36.9 C)] 97.8 F (36.6 C) (06/15 1215) Pulse Rate:  [56-78] 75 (06/15 1215) Resp:  [16-31] 24 (06/15 1215) BP: (128-165)/(88-101) 149/96 (06/15 1215) SpO2:  [87 %-100 %] 99 % (06/15 1217) Weight:  [81.6 kg] 81.6 kg (06/15 0607)  Weight change:  Filed Weights   12/25/18 0607  Weight: 81.6 kg    Intake/Output: No intake/output data recorded.   Intake/Output this shift:  Total I/O In: -  Out: 350 [Urine:350]  Physical Exam: General: NAD,   Head: Normocephalic, atraumatic. Moist oral mucosal membranes  Eyes: Anicteric, PERRL  Neck: Supple, trachea midline  Lungs:  Clear to auscultation  Heart: Regular rate and rhythm  Abdomen:  Soft, nontender,   Extremities:  trace  peripheral edema.  Neurologic: Nonfocal, moving all four extremities  Skin: No lesions        Basic Metabolic Panel: Recent Labs  Lab 12/21/18 1429 12/25/18 0615  NA 141 139  K 4.7 5.1  CL 102 102  CO2 20 23  GLUCOSE 147* 151*  BUN 68* 70*  CREATININE 5.68* 5.21*  CALCIUM 8.3* 8.2*    Liver Function Tests: Recent Labs  Lab 12/25/18 0615  AST 16  ALT 18  ALKPHOS 67  BILITOT 0.5  PROT 7.8  ALBUMIN 3.0*   No results for input(s): LIPASE, AMYLASE in the last 168 hours. No results for input(s): AMMONIA in the last 168 hours.  CBC: Recent Labs  Lab 12/25/18 0615  WBC 8.5  NEUTROABS 6.9  HGB 8.3*  HCT 27.8*  MCV 88.3  PLT 128*    Cardiac Enzymes: Recent Labs  Lab 12/25/18 0615  TROPONINI 0.06*    BNP: Invalid input(s): POCBNP  CBG: Recent Labs  Lab 12/25/18 1325  GLUCAP 117*    Microbiology: Results for orders placed or performed during the hospital encounter of  12/25/18  SARS Coronavirus 2 (CEPHEID- Performed in [place] hospital lab), Hosp Order     Status: None   Collection Time: 12/25/18  6:15 AM   Specimen: Nasopharyngeal  Result Value Ref Range Status   SARS Coronavirus 2 NEGATIVE NEGATIVE Final    Comment: (NOTE) If result is NEGATIVE SARS-CoV-2 target nucleic acids are NOT DETECTED. The SARS-CoV-2 RNA is generally detectable in upper and lower  respiratory specimens during the acute phase of infection. The lowest  concentration of SARS-CoV-2 viral copies this assay can detect is 250  copies / mL. A negative result does not preclude SARS-CoV-2 infection  and should not be used as the sole basis for treatment or other  patient management decisions.  A negative result may occur with  improper specimen collection / handling, submission of specimen other  than nasopharyngeal swab, presence of viral mutation(s) within the  areas targeted by this assay, and inadequate number of viral copies  (<250 copies / mL). A negative result must be combined with clinical  observations, patient history, and epidemiological information. If result is POSITIVE SARS-CoV-2 target nucleic acids are DETECTED. The SARS-CoV-2 RNA is generally detectable in upper and lower  respiratory specimens dur ing the acute phase of infection.  Positive  results are indicative of active infection with SARS-CoV-2.  Clinical  correlation with patient history and other  diagnostic information is  necessary to determine patient infection status.  Positive results do  not rule out bacterial infection or co-infection with other viruses. If result is PRESUMPTIVE POSTIVE SARS-CoV-2 nucleic acids MAY BE PRESENT.   A presumptive positive result was obtained on the submitted specimen  and confirmed on repeat testing.  While 2019 novel coronavirus  (SARS-CoV-2) nucleic acids may be present in the submitted sample  additional confirmatory testing may be necessary for epidemiological   and / or clinical management purposes  to differentiate between  SARS-CoV-2 and other Sarbecovirus currently known to infect humans.  If clinically indicated additional testing with an alternate test  methodology [phone]) is advised. The SARS-CoV-2 RNA is generally  detectable in upper and lower respiratory sp ecimens during the acute  phase of infection. The expected result is Negative. Fact Sheet for Patients:  StrictlyIdeas.no Fact Sheet for Healthcare Providers: BankingDealers.co.za This test is not yet approved or cleared by the [place] FDA and has been authorized for detection and/or diagnosis of SARS-CoV-2 by FDA under an Emergency Use Authorization (EUA).  This EUA will remain in effect (meaning this test can be used) for the duration of the COVID-19 declaration under Section 564(b)(1) of the Act, 21 U.S.C. section 360bbb-3(b)(1), unless the authorization is terminated or revoked sooner. Performed at [facility], [address] [place], [place] [address]     Coagulation Studies: No results for input(s): LABPROT, INR in the last 72 hours.  Urinalysis: Recent Labs    12/25/18 0615  COLORURINE YELLOW*  LABSPEC 1.010  PHURINE 6.0  GLUCOSEU NEGATIVE  HGBUR SMALL*  BILIRUBINUR NEGATIVE  KETONESUR NEGATIVE  PROTEINUR 30*  NITRITE NEGATIVE  LEUKOCYTESUR LARGE*      Imaging: Ct Abdomen Pelvis Wo Contrast  Result Date: 12/25/2018 CLINICAL DATA:  Shortness of breath and cough, weakness. Bilateral hydronephrosis EXAM: CT ABDOMEN AND PELVIS WITHOUT CONTRAST TECHNIQUE: Multidetector CT imaging of the abdomen and pelvis was performed following the standard protocol without IV contrast. COMPARISON:  CT abdomen dated 11/06/2018. Chest x-ray from earlier same day. FINDINGS: Lower chest: Bibasilar pleural effusions, at least moderate in size, incompletely imaged with associated compressive atelectasis.  Hepatobiliary: Stable small hypodensity within the RIGHT liver lobe, too small to definitively characterize but most likely a cyst. No new liver findings. Gallbladder appears normal. No bile duct dilatation seen. Pancreas: Unremarkable. No pancreatic ductal dilatation or surrounding inflammatory changes. Spleen: Normal in size without focal abnormality. Adrenals/Urinary Tract: Bilateral hydronephrosis, moderate to severe in degree, not significantly changed compared to the previous study. Stable bilateral hydroureter. Bladder is decompressed by Foley catheter. Stomach/Bowel: No dilated large or small bowel loops. Stomach is unremarkable, partially decompressed. Moderate amount of stool in the rectal vault with commensurate distension. Diffuse colonic diverticulosis but no focal inflammatory change to suggest acute diverticulitis. Appendix is not seen but there are no inflammatory changes in the RIGHT lower quadrant to suggest acute appendicitis. Vascular/Lymphatic: Infrarenal abdominal aortic aneurysm, measuring 5.2 x 5 cm, stable in the short-term interval. Extensive atherosclerosis of the abdominal aorta and branch vessels. Extensive atherosclerosis of the pelvic vasculature. No acute findings. No enlarged lymph nodes seen. Reproductive: Brachytherapy seeds in place. Other: No free fluid or abscess collection seen. No free intraperitoneal air. Musculoskeletal: No acute or suspicious osseous finding. IMPRESSION: 1. New bibasilar pleural effusions, at least moderate in size, incompletely imaged, with associated compressive atelectasis. 2. Bilateral hydronephrosis and hydroureter, moderate to severe in degree, not significantly changed compared to the previous study. Bladder is  decompressed by Foley catheter. 3. Colonic diverticulosis without evidence of acute diverticulitis. 4. Infrarenal abdominal aortic aneurysm, measuring 5.2 cm, stable in the short-term interval. No periaortic hemorrhage or inflammation.  Recommend followup by abdomen and pelvis CTA in 3-6 months, and vascular surgery referral/consultation if not already obtained. This recommendation follows ACR consensus guidelines: White Paper of the ACR Incidental Findings Committee II on Vascular Findings. J Am Coll Radiol 2013; 10:789-794. Electronically Signed   By: Franki Cabot M.D.   On: 12/25/2018 10:33   Dg Chest Port 1 View  Result Date: 12/25/2018 CLINICAL DATA:  Shortness of breath. EXAM: PORTABLE CHEST 1 VIEW COMPARISON:  11/05/2018. FINDINGS: Cardiac pacer noted with lead tip over the right atrium right ventricle. Heart size stable. Bibasilar infiltrates/edema and small bilateral pleural effusions. Bibasilar atelectasis. No pneumothorax. IMPRESSION: 1.  Cardiac pacer stable position. 2. Bibasilar infiltrates/edema and small bilateral pleural effusions. Findings have increased from prior exam. Electronically Signed   By: [place]   On: 12/25/2018 07:07     Medications:   . sodium chloride    . [START ON 12/26/2018] sodium chloride    . [START ON 12/26/2018] azithromycin    . [START ON 12/26/2018] cefTRIAXone (ROCEPHIN)  IV     . amLODipine  2.5 mg Oral Daily  . finasteride  5 mg Oral Daily  . furosemide  40 mg Intravenous Q12H  . insulin aspart  0-5 Units Subcutaneous QHS  . insulin aspart  0-9 Units Subcutaneous TID WC  . metoprolol tartrate  25 mg Oral BID  . pantoprazole  40 mg Oral Daily  . rosuvastatin  5 mg Oral QHS  . sodium bicarbonate  650 mg Oral BID  . sodium chloride flush  3 mL Intravenous Q12H  . tamsulosin  0.4 mg Oral Daily   sodium chloride, acetaminophen **OR** acetaminophen, ondansetron **OR** ondansetron (ZOFRAN) IV, sodium chloride flush  Assessment/ Plan:  Blake [name] is a 85 y.o. black male with high-grade bladder cancer, incompletely resected tumor, severe bilateral hydronephrosis, history of prostate cancer, recurrent UTIs, atrial fibrillation, pacemaker/defibrillator, diabetes  mellitus type II who is admitted to [facility] on 12/25/2018 for Hydronephrosis [N13.30] Respiratory distress [R06.03] SOB (shortness of breath) [R06.02] Hypoxia [R09.02]  1. Acute renal failure with obstructive uropathy. With metabolic acidosis Chronic kidney disease stage IV.  Appreciate urology input. Plan on nephrostomy tube placement by IR.  Baseline creatinine 2.22, GFR of 28 09/13/2018 - sodium bicarbonate  2. Hypertension with diastolic congestive heart failure:  - IV furosemide - metoprolol, tamsulosin  3. Urinary tract infection:  - empiric azithromycin and ceftriaxone.    LOS: 0 Matai Carpenito 6/15/20203:41 PM

## 2018-12-25 NOTE — ED Provider Notes (Signed)
Patient's COVID test is negative.  His BNP is elevated.  His chest x-ray looks worse than previously.  His white count is not elevated but his differential shows a shift to the left.  Patient has a normal temperature but was hypoxic on room air which is what I understand he usually is on.  Will need to come in the hospital.  Dr. Beather Arbour is already started antibiotics as he has a UTI and he may have a pneumonia as well.  He also seems to have some CHF going on.  His renal function however stable.   Nena Polio, MD 12/25/18 [phone]

## 2018-12-25 NOTE — Consult Note (Signed)
Brief consult note  patient's imaging from today, CT scan of the abdomen and pelvis was personally reviewed as well as his history.  I discussed with Dr. Bernardo Heater this patient as well.  Briefly, he has a history of worsening creatinine/renal function and bilateral hydronephrosis in the setting of a history of bladder cancer.  Family has decided against endoscopic management given that it would require anesthesia.  He is now admitted with congestive heart failure and needs diuresis.  In order to maximize this, it is recommended that he undergo bilateral nephrostomy tube placement as was discussed extensively with the patient and his family previously on 12/21/2018.  I personally called and discussed the situation with the power of attorney, Dr. Primus Bravo, who is his daughter.  I discussed the reasons for nephrostomy tube placement in order to maximize success with diuresis and hopefully improve renal function.  I discussed that there is no guarantee that his renal function will improve but this is his best shot.  I have discussed the case with Dr. Carman Ching with interventional radiology and we plan to proceed with bilateral nephrostomy tube placement.  Dr. Bernardo Heater is aware and will check on the patient in the morning.  Please call with any questions or concerns

## 2018-12-25 NOTE — H&P (Signed)
[facility] [place] NAME: Blake [name]    MR#:  [id number]  DATE OF BIRTH:  October 09, 1922  DATE OF ADMISSION:  12/25/2018  PRIMARY CARE PHYSICIAN: Adin Hector, MD   REQUESTING/REFERRING PHYSICIAN: Corinna Capra MD  CHIEF COMPLAINT:   Chief Complaint  Patient presents with  . Respiratory Distress    HISTORY OF PRESENT ILLNESS: Blake [name]  is a 85 y.o. male with a known history of diabetes type 2, chronic kidney disease, triple AAA, hyperlipidemia, hypertension, prostate cancer who was recently hospitalized with similar presentation.  Who is presenting with shortness of breath and cough.  And weakness.  Patient has bilateral hydronephrosis and has been followed by urology.  Plan was for him to have nephrostomy tubes placed.  However according to urology notes they were waiting so the family can be with him during procedure.  Patient states that his been having shortness of breath and cough for the past few days.  In the ER he was evaluated and noticed to have findings consistent with likely CHF.  PAST MEDICAL HISTORY:   Past Medical History:  Diagnosis Date  . AAA (abdominal aortic aneurysm) ([place])   . Arrhythmia   . Diabetes mellitus without complication ([place])   . GI bleed   . Heart murmur   . Hyperlipidemia   . Hypertension   . Pacemaker   . Prostate cancer ([place])     PAST SURGICAL HISTORY:  Past Surgical History:  Procedure Laterality Date  . FLEXIBLE SIGMOIDOSCOPY N/A 10/21/2016   Procedure: FLEXIBLE SIGMOIDOSCOPY;  Surgeon: [name], MD;  Location: [facility] ENDOSCOPY;  Service: Endoscopy;  Laterality: N/A;  . JOINT REPLACEMENT    . PACEMAKER INSERTION Left 04/14/2015   Procedure: INSERTION PACEMAKER;  Surgeon: Isaias Cowman, MD;  Location: [facility] ORS;  Service: Cardiovascular;  Laterality: Left;  . PROSTATE SURGERY    . VISCERAL ARTERY INTERVENTION N/A 10/22/2016   Procedure: Visceral Artery Intervention;  Surgeon: Algernon Huxley, MD;  Location: [facility] CV LAB;  Service: Cardiovascular;  Laterality: N/A;    SOCIAL HISTORY:  Social History   Tobacco Use  . Smoking status: Former Research scientist (life sciences)  . Smokeless tobacco: Never Used  Substance Use Topics  . Alcohol use: No    FAMILY HISTORY:  Family History  Problem Relation Age of Onset  . Hypertension Other   . Stroke Other   . Heart attack Other   . Stroke Sister   . Heart attack Brother     DRUG ALLERGIES:  Allergies  Allergen Reactions  . Diovan [Valsartan] Cough  . Gabapentin Other (See Comments)    Sedation at all doses  . Hydralazine Itching  . Lisinopril Cough  . Pregabalin Other (See Comments)    Sedation at all doses  . Levofloxacin Other (See Comments)    Too many side effects. Nausea, vomiting, upset stomach, increased confusion, etc Other reaction(s): Confusion Nausea, chills Nausea, chills   . Sulfamethoxazole-Trimethoprim Other (See Comments)    Too many side effects. Nausea, vomiting, upset stomach, increased confusion, etc    REVIEW OF SYSTEMS:   CONSTITUTIONAL: No fever, fatigue or weakness.  EYES: No blurred or double vision.  EARS, NOSE, AND THROAT: No tinnitus or ear pain.  RESPIRATORY: Positive cough, positive shortness of breath, wheezing or hemoptysis.  CARDIOVASCULAR: No chest pain, orthopnea, edema.  GASTROINTESTINAL: No nausea, vomiting, diarrhea or abdominal pain.  GENITOURINARY: No dysuria, hematuria.  ENDOCRINE: No polyuria, nocturia,  HEMATOLOGY: No anemia,  easy bruising or bleeding SKIN: No rash or lesion. MUSCULOSKELETAL: No joint pain or arthritis.   NEUROLOGIC: No tingling, numbness, weakness.  PSYCHIATRY: No anxiety or depression.   MEDICATIONS AT HOME:  Prior to Admission medications   Medication Sig Start Date End Date Taking? Authorizing Provider  amLODipine (NORVASC) 2.5 MG tablet Take 1 tablet (2.5 mg total) by mouth daily. 11/10/18   Hillary Bow, MD  cefTAZidime (FORTAZ) IVPB Inject 1 g into  the vein daily. Indication: pseudomonas UTI Last Day of Therapy:  12/03/2018 Once weekly: CBC/D, BMP 11/29/18   Dustin Flock, MD  finasteride (PROSCAR) 5 MG tablet Take 5 mg by mouth daily.    [provider]  Iron-Vitamin C (VITRON-C PO) Take 1 tablet by mouth daily.    [provider]  metoprolol tartrate 37.5 MG TABS Take 37.5 mg by mouth 2 (two) times daily. Patient taking differently: Take 25 mg by mouth 2 (two) times daily.  09/13/17   Gladstone Lighter, MD  Multiple Vitamins-Minerals (PRESERVISION/LUTEIN PO) Take 1 capsule by mouth 2 (two) times daily.    [provider]  multivitamin-iron-minerals-folic acid (CENTRUM) chewable tablet Chew 2 tablets by mouth daily.    [provider]  pantoprazole (PROTONIX) 40 MG tablet Take 40 mg by mouth daily.    [provider]  rosuvastatin (CRESTOR) 5 MG tablet Take 5 mg by mouth at bedtime.    [provider]  sitaGLIPtin (JANUVIA) 50 MG tablet Take 50 mg by mouth daily.    [provider]  sodium bicarbonate 650 MG tablet Take 1 tablet (650 mg total) by mouth 2 (two) times daily. 09/13/17   Gladstone Lighter, MD  tamsulosin (FLOMAX) 0.4 MG CAPS capsule Take 0.4 mg by mouth daily.     [provider]      PHYSICAL EXAMINATION:   VITAL SIGNS: Blood pressure (!) 161/99, pulse 72, temperature 98.4 F (36.9 C), temperature source Rectal, resp. rate (!) 27, height 6\' 2"  (1.88 m), weight 81.6 kg, SpO2 100 %.  GENERAL:  85 y.o.-year-old patient lying in the bed with no acute distress.  EYES: Pupils equal, round, reactive to light and accommodation. No scleral icterus. Extraocular muscles intact.  HEENT: Head atraumatic, normocephalic. Oropharynx and nasopharynx clear.  NECK:  Supple, no jugular venous distention. No thyroid enlargement, no tenderness.  LUNGS: Normal breath sounds bilaterally, no wheezing, rales,rhonchi or crepitation. No use of accessory muscles of respiration.   CARDIOVASCULAR: S1, S2 normal. No murmurs, rubs, or gallops.  ABDOMEN: Soft, nontender, nondistended. Bowel sounds present. No organomegaly or mass.  EXTREMITIES: Positive pedal edema, cyanosis, or clubbing.  NEUROLOGIC: Cranial nerves II through XII are intact. Muscle strength 5/5 in all extremities. Sensation intact. Gait not checked.  PSYCHIATRIC: The patient is alert and oriented x 3.  SKIN: No obvious rash, lesion, or ulcer.   LABORATORY PANEL:   CBC Recent Labs  Lab 12/25/18 0615  WBC 8.5  HGB 8.3*  HCT 27.8*  PLT 128*  MCV 88.3  MCH 26.3  MCHC 29.9*  RDW 16.6*  LYMPHSABS 0.8  MONOABS 0.5  EOSABS 0.1  BASOSABS 0.0   ------------------------------------------------------------------------------------------------------------------  Chemistries  Recent Labs  Lab 12/21/18 1429 12/25/18 0615  NA 141 139  K 4.7 5.1  CL 102 102  CO2 20 23  GLUCOSE 147* 151*  BUN 68* 70*  CREATININE 5.68* 5.21*  CALCIUM 8.3* 8.2*  AST  --  16  ALT  --  18  ALKPHOS  --  67  BILITOT  --  0.5   ------------------------------------------------------------------------------------------------------------------ estimated creatinine clearance is 10 mL/min (A) (by C-G formula based on SCr of 5.21 mg/dL (H)). ------------------------------------------------------------------------------------------------------------------ No results for input(s): TSH, T4TOTAL, T3FREE, THYROIDAB in the last 72 hours.  Invalid input(s): FREET3   Coagulation profile No results for input(s): INR, PROTIME in the last 168 hours. ------------------------------------------------------------------------------------------------------------------- No results for input(s): DDIMER in the last 72 hours. -------------------------------------------------------------------------------------------------------------------  Cardiac Enzymes Recent Labs  Lab 12/25/18 0615  TROPONINI 0.06*    ------------------------------------------------------------------------------------------------------------------ Invalid input(s): POCBNP  ---------------------------------------------------------------------------------------------------------------  Urinalysis    Component Value Date/Time   COLORURINE YELLOW (A) 12/25/2018 0615   APPEARANCEUR TURBID (A) 12/25/2018 0615   APPEARANCEUR Cloudy (A) 11/21/2018 1258   LABSPEC 1.010 12/25/2018 0615   LABSPEC 1.029 12/29/2012 1554   PHURINE 6.0 12/25/2018 0615   GLUCOSEU NEGATIVE 12/25/2018 0615   GLUCOSEU see comment 12/29/2012 1554   HGBUR SMALL (A) 12/25/2018 0615   BILIRUBINUR NEGATIVE 12/25/2018 0615   BILIRUBINUR Negative 11/21/2018 1258   BILIRUBINUR see comment 12/29/2012 1554   KETONESUR NEGATIVE 12/25/2018 0615   PROTEINUR 30 (A) 12/25/2018 0615   NITRITE NEGATIVE 12/25/2018 0615   LEUKOCYTESUR LARGE (A) 12/25/2018 0615   LEUKOCYTESUR see comment 12/29/2012 1554     RADIOLOGY: Dg Chest Port 1 View  Result Date: 12/25/2018 CLINICAL DATA:  Shortness of breath. EXAM: PORTABLE CHEST 1 VIEW COMPARISON:  11/05/2018. FINDINGS: Cardiac pacer noted with lead tip over the right atrium right ventricle. Heart size stable. Bibasilar infiltrates/edema and small bilateral pleural effusions. Bibasilar atelectasis. No pneumothorax. IMPRESSION: 1.  Cardiac pacer stable position. 2. Bibasilar infiltrates/edema and small bilateral pleural effusions. Findings have increased from prior exam. Electronically Signed   By: Marcello Moores  Register   On: 12/25/2018 07:07    EKG: Orders placed or performed during the hospital encounter of 12/25/18  . ED EKG  . ED EKG  . ED EKG  . ED EKG  . EKG 12-Lead  . EKG 12-Lead    IMPRESSION AND PLAN: Patient is a 85 year old with chronic medical problems presents with shortness of breath cough  1.  Shortness of breath suspect due to acute diastolic CHF in setting of chronic bilateral hydronephrosis I will  treat him with IV Lasix No need to repeat echocardiogram due to recent echocardiogram being done There is some question about pneumonia will check procalcitonin continue antibiotics for now  2.  Chronic kidney disease related to bilateral hydronephrosis in light of diuresis I will asked nephrology to see  3.  Bilateral hydronephrosis urology has been notified  4.  Diabetes type 2 we will place him on sliding scale insulin for now  5.  Hypertension continue metoprolol       All the records are reviewed and case discussed with ED provider. Management plans discussed with the patient, family and they are in agreement.  CODE STATUS: Full code    Code Status Orders  (From admission, onward)         Start     Ordered   12/25/18 0646  Full code  Continuous     12/25/18 0645        Code Status History    Date Active Date Inactive Code Status Order ID Comments User Context   11/21/2018 2217 11/29/2018 2052 Full Code [id number]  Mayer Camel, NP ED   11/05/2018 1755 11/09/2018 1728 Full Code [id number]  Sela Hua, MD Inpatient   09/10/2017 1430 09/13/2017 2115 Full Code [id number]  Idelle Crouch, MD Inpatient  06/10/2017 2304 06/12/2017 1908 Full Code [id number]  Lance Coon, MD Inpatient   05/17/2017 0829 05/19/2017 1525 Full Code [id number]  Hillary Bow, MD ED   03/31/2017 1334 04/03/2017 1611 Full Code [id number]  Nicholes Mango, MD Inpatient   10/21/2016 0155 10/25/2016 2145 Full Code [id number]  Hugelmeyer, Spade, DO Inpatient   10/09/2016 2342 10/13/2016 2009 Full Code [id number]  Vaughan Basta, MD Inpatient   04/14/2015 1432 04/15/2015 1739 Full Code [id number]  Isaias Cowman, MD Inpatient   04/09/2015 0022 04/14/2015 1432 Full Code [id number]  Hower, Aaron Mose, MD ED   Advance Care Planning Activity    Advance Directive Documentation     Most Recent Value  Type of Advance Directive  Healthcare Power of Attorney  Pre-existing out of facility DNR order (yellow form or  pink MOST form)  -  "MOST" Form in Place?  -       TOTAL TIME TAKING CARE OF THIS PATIENT: 55 minutes.    Dustin Flock M.D on 12/25/2018 at 8:49 AM  Between 7am to 6pm - Pager - [phone]  After 6pm go to [email or web address] - password [email or web address] [facility]  [phone]  CC: Primary care physician; Adin Hector, MD

## 2018-12-25 NOTE — Progress Notes (Signed)
Advanced care plan.  Purpose of the Encounter: CODE STATUS  Parties in Attendance: Patient himself  Patient's Decision Capacity: Somewhat intact  Subjective/Patient's story: Blake [name]  is a 85 y.o. male with a known history of diabetes type 2, chronic kidney disease, triple AAA, hyperlipidemia, hypertension, prostate cancer who was recently hospitalized with similar presentation.  Who is presenting with shortness of breath and cough.  And weakness.  Patient has bilateral hydronephrosis and has been followed by urology.  Plan was for him to have nephrostomy tubes placed.  However according to urology notes they were waiting so the family can be with him during procedure.  Patient states that his been having shortness of breath and cough for the past few days.  In the ER he was evaluated and noticed to have findings consistent with likely CHF.    Objective/Medical story I discussed with the patient regarding his desires for cardiac and pulmonary resuscitation. Also discussed and confirmed with him regarding his healthcare power of attorney and living will  Goals of care determination:  Patient unable to make decision regarding his CODE STATUS, his daughter who is a Pharmacist, community is his healthcare power of attorney  Due to patient's advanced age this is his third admission within the past 3 months last palliative care team to see patient also has progressive renal failure  CODE STATUS:  Full  Time spent discussing advanced care planning: 16 minutes

## 2018-12-25 NOTE — Progress Notes (Signed)
Patient will mostly likely have procedure tomorrow. Will start DVT ppx (heparin 5000 units SQ q8H) after procedure.   Eleonore Chiquito, PharmD, BCPS

## 2018-12-25 NOTE — TOC Progression Note (Signed)
Transition of Care [facility]) - Progression Note    Patient Details  Name: Blake [name] MRN: [id number] Date of Birth: 1923/04/28  Transition of Care [facility]) CM/SW Contact  Marshell Garfinkel, RN Phone Number: 12/25/2018, 8:04 AM  Clinical Narrative:     RNCM notified by Patient [name] that patient was recently followed [facility].  Message has been left for [facility] with [facility] for status update.        Expected Discharge Plan and Services                                                 Social Determinants of Health (SDOH) Interventions    Readmission Risk Interventions Readmission Risk Prevention Plan 11/24/2018 11/09/2018 11/06/2018  Transportation Screening Complete Complete Complete  PCP or Specialist Appt within 3-5 Days Complete Complete -  HRI or Home Care Consult Complete Complete -  Social Work Consult for [place] Planning/Counseling (No Data) - -  Palliative Care Screening Not Applicable - -  Medication Review Press photographer) Complete Complete -  Some recent data might be hidden

## 2018-12-26 ENCOUNTER — Inpatient Hospital Stay: Payer: Medicare Other

## 2018-12-26 DIAGNOSIS — R0602 Shortness of breath: Secondary | ICD-10-CM

## 2018-12-26 DIAGNOSIS — I5031 Acute diastolic (congestive) heart failure: Secondary | ICD-10-CM

## 2018-12-26 DIAGNOSIS — R0902 Hypoxemia: Secondary | ICD-10-CM

## 2018-12-26 DIAGNOSIS — Z7189 Other specified counseling: Secondary | ICD-10-CM

## 2018-12-26 DIAGNOSIS — N1339 Other hydronephrosis: Secondary | ICD-10-CM

## 2018-12-26 DIAGNOSIS — R319 Hematuria, unspecified: Secondary | ICD-10-CM

## 2018-12-26 DIAGNOSIS — Z515 Encounter for palliative care: Secondary | ICD-10-CM

## 2018-12-26 LAB — BASIC METABOLIC PANEL
Anion gap: 15 (ref 5–15)
BUN: 75 mg/dL — ABNORMAL HIGH (ref 8–23)
CO2: 20 mmol/L — ABNORMAL LOW (ref 22–32)
Calcium: 8 mg/dL — ABNORMAL LOW (ref 8.9–10.3)
Chloride: 105 mmol/L (ref 98–111)
Creatinine, Ser: 5.66 mg/dL — ABNORMAL HIGH (ref 0.61–1.24)
GFR calc Af Amer: 9 mL/min — ABNORMAL LOW (ref >60)
GFR calc non Af Amer: 8 mL/min — ABNORMAL LOW (ref >60)
Glucose, Bld: 141 mg/dL — ABNORMAL HIGH (ref 70–99)
Potassium: 4.8 mmol/L (ref 3.5–5.1)
Sodium: 140 mmol/L (ref 135–145)

## 2018-12-26 LAB — CBC
HCT: 26.8 % — ABNORMAL LOW (ref 39.0–52.0)
Hemoglobin: 8.1 g/dL — ABNORMAL LOW (ref 13.0–17.0)
MCH: 26.9 pg (ref 26.0–34.0)
MCHC: 30.2 g/dL (ref 30.0–36.0)
MCV: 89 fL (ref 80.0–100.0)
Platelets: 130 10*3/uL — ABNORMAL LOW (ref 150–400)
RBC: 3.01 MIL/uL — ABNORMAL LOW (ref 4.22–5.81)
RDW: 16.4 % — ABNORMAL HIGH (ref 11.5–15.5)
WBC: 9.2 10*3/uL (ref 4.0–10.5)
nRBC: 0 % (ref 0.0–0.2)

## 2018-12-26 LAB — BLOOD CULTURE ID PANEL (REFLEXED)

## 2018-12-26 LAB — GLUCOSE, CAPILLARY
Glucose-Capillary: 129 mg/dL — ABNORMAL HIGH (ref 70–99)
Glucose-Capillary: 124 mg/dL — ABNORMAL HIGH (ref 70–99)
Glucose-Capillary: 142 mg/dL — ABNORMAL HIGH (ref 70–99)
Glucose-Capillary: 154 mg/dL — ABNORMAL HIGH (ref 70–99)

## 2018-12-26 LAB — PROTIME-INR
INR: 1.4 — ABNORMAL HIGH (ref 0.8–1.2)
Prothrombin Time: 16.6 seconds — ABNORMAL HIGH (ref 11.4–15.2)

## 2018-12-26 MED ORDER — MIDAZOLAM HCL 2 MG/2ML IJ SOLN
INTRAMUSCULAR | Status: AC | PRN
  Administered 2018-12-26 (×2): 0.5 mg via INTRAVENOUS

## 2018-12-26 MED ORDER — NITROGLYCERIN 2 % TD OINT
0.5000 [in_us] | TOPICAL_OINTMENT | Freq: Four times a day (QID) | TRANSDERMAL | Status: DC
  Administered 2018-12-26 – 2018-12-31 (×18): 0.5 [in_us] via TOPICAL
  Filled 2018-12-26 (×18): qty 1

## 2018-12-26 MED ORDER — SODIUM CHLORIDE 0.9 % IV SOLN
2.0000 g | Freq: Two times a day (BID) | INTRAVENOUS | Status: DC
  Administered 2018-12-26: 2 g via INTRAVENOUS
  Filled 2018-12-26 (×2): qty 2

## 2018-12-26 MED ORDER — SODIUM CHLORIDE 0.9 % IV SOLN
1.0000 g | INTRAVENOUS | Status: DC
  Administered 2018-12-27 – 2019-01-02 (×7): 1 g via INTRAVENOUS
  Filled 2018-12-26 (×8): qty 1

## 2018-12-26 MED ORDER — FENTANYL CITRATE (PF) 100 MCG/2ML IJ SOLN
INTRAMUSCULAR | Status: AC | PRN
  Administered 2018-12-26 (×2): 25 ug via INTRAVENOUS

## 2018-12-26 MED ORDER — MIDAZOLAM HCL 2 MG/2ML IJ SOLN
INTRAMUSCULAR | Status: AC
  Filled 2018-12-26: qty 4

## 2018-12-26 MED ORDER — FENTANYL CITRATE (PF) 100 MCG/2ML IJ SOLN
INTRAMUSCULAR | Status: AC
  Filled 2018-12-26: qty 4

## 2018-12-26 MED ORDER — HEPARIN SODIUM (PORCINE) 5000 UNIT/ML IJ SOLN
5000.0000 [IU] | Freq: Three times a day (TID) | INTRAMUSCULAR | Status: DC
  Administered 2018-12-27 – 2019-01-01 (×15): 5000 [IU] via SUBCUTANEOUS
  Filled 2018-12-26 (×15): qty 1

## 2018-12-26 NOTE — Progress Notes (Signed)
PHARMACY NOTE:  ANTIMICROBIAL RENAL DOSAGE ADJUSTMENT  Current antimicrobial regimen includes a mismatch between antimicrobial dosage and estimated renal function.  As per policy approved by the Pharmacy & Therapeutics and Medical Executive Committees, the antimicrobial dosage will be adjusted accordingly.  Current antimicrobial dosage:  Cefepime 2 g q12H   Indication: Bacteremia PSA  Renal Function:  Estimated Creatinine Clearance: 9.2 mL/min (A) (by C-G formula based on SCr of 5.66 mg/dL (H)).    Antimicrobial dosage has been changed to:  Cefepime 1 g 24H   Additional comments: Typically for bacteremia and CrCl < 10 we can dose at 500 mg q24H, but will be more aggressive and adjust to cefepime 1g q24H.   Thank you for allowing pharmacy to be a part of this patient's care.  Oswald Hillock, [facility] 12/26/2018 8:41 AM

## 2018-12-26 NOTE — Progress Notes (Signed)
Urology Consult Follow Up  Subjective: No complaints.  Interventional radiology placed a left PCN this morning.  Due to concerns of his airway and a prone position required for bilateral PCN a right nephrostomy tube was not placed.  Anti-infectives: Anti-infectives (From admission, onward)   Start     Dose/Rate Route Frequency Ordered Stop   12/27/18 1000  ceFEPIme (MAXIPIME) 1 g in sodium chloride 0.9 % 100 mL IVPB     1 g 200 mL/hr over 30 Minutes Intravenous Every 24 hours 12/26/18 0839     12/26/18 0900  cefTRIAXone (ROCEPHIN) 1 g in sodium chloride 0.9 % 100 mL IVPB  Status:  Discontinued     1 g 200 mL/hr over 30 Minutes Intravenous Every 24 hours 12/25/18 0910 12/26/18 0354   12/26/18 0900  azithromycin (ZITHROMAX) 500 mg in sodium chloride 0.9 % 250 mL IVPB  Status:  Discontinued     500 mg 250 mL/hr over 60 Minutes Intravenous Every 24 hours 12/25/18 0910 12/26/18 0354   12/26/18 0400  ceFEPIme (MAXIPIME) 2 g in sodium chloride 0.9 % 100 mL IVPB  Status:  Discontinued     2 g 200 mL/hr over 30 Minutes Intravenous Every 12 hours 12/26/18 0354 12/26/18 0839   12/25/18 0830  cefTRIAXone (ROCEPHIN) 1 g in sodium chloride 0.9 % 100 mL IVPB     1 g 200 mL/hr over 30 Minutes Intravenous  Once 12/25/18 0815 12/25/18 0936   12/25/18 0830  azithromycin (ZITHROMAX) 500 mg in sodium chloride 0.9 % 250 mL IVPB     500 mg 250 mL/hr over 60 Minutes Intravenous  Once 12/25/18 0815 12/25/18 1127      Current Facility-Administered Medications  Medication Dose Route Frequency Provider Last Rate Last Dose  . 0.9 %  sodium chloride infusion  250 mL Intravenous PRN Dustin Flock, MD      . 0.9 %  sodium chloride infusion   Intravenous Continuous Corrie Mckusick, DO   Stopped at 12/26/18 1023  . acetaminophen (TYLENOL) tablet 650 mg  650 mg Oral Q6H PRN Dustin Flock, MD   650 mg at 12/25/18 1447   Or  . acetaminophen (TYLENOL) suppository 650 mg  650 mg Rectal Q6H PRN Dustin Flock, MD       . amLODipine (NORVASC) tablet 2.5 mg  2.5 mg Oral Daily Dustin Flock, MD   2.5 mg at 12/26/18 1150  . [START ON 12/27/2018] ceFEPIme (MAXIPIME) 1 g in sodium chloride 0.9 % 100 mL IVPB  1 g Intravenous Q24H Dustin Flock, MD      . fentaNYL (SUBLIMAZE) 100 MCG/2ML injection           . finasteride (PROSCAR) tablet 5 mg  5 mg Oral Daily Dustin Flock, MD   5 mg at 12/26/18 1150  . furosemide (LASIX) injection 40 mg  40 mg Intravenous Q12H Dustin Flock, MD   40 mg at 12/26/18 1151  . [START ON 12/27/2018] heparin injection 5,000 Units  5,000 Units Subcutaneous Q8H Dustin Flock, MD      . insulin aspart (novoLOG) injection 0-5 Units  0-5 Units Subcutaneous QHS Dustin Flock, MD      . insulin aspart (novoLOG) injection 0-9 Units  0-9 Units Subcutaneous TID WC Dustin Flock, MD   2 Units at 12/25/18 1735  . metoprolol tartrate (LOPRESSOR) tablet 25 mg  25 mg Oral BID Dustin Flock, MD   25 mg at 12/26/18 1151  . midazolam (VERSED) 2 MG/2ML injection           .  ondansetron (ZOFRAN) tablet 4 mg  4 mg Oral Q6H PRN Dustin Flock, MD       Or  . ondansetron (ZOFRAN) injection 4 mg  4 mg Intravenous Q6H PRN Dustin Flock, MD      . pantoprazole (PROTONIX) EC tablet 40 mg  40 mg Oral Daily Dustin Flock, MD   40 mg at 12/26/18 1150  . rosuvastatin (CRESTOR) tablet 5 mg  5 mg Oral QHS Dustin Flock, MD   5 mg at 12/25/18 2215  . sodium bicarbonate tablet 650 mg  650 mg Oral BID Dustin Flock, MD   650 mg at 12/26/18 1150  . sodium chloride flush (NS) 0.9 % injection 3 mL  3 mL Intravenous Q12H Dustin Flock, MD   3 mL at 12/26/18 1152  . sodium chloride flush (NS) 0.9 % injection 3 mL  3 mL Intravenous PRN Dustin Flock, MD      . tamsulosin [facility]) capsule 0.4 mg  0.4 mg Oral Daily Dustin Flock, MD   0.4 mg at 12/25/18 1735     Objective: Vital signs in last 24 hours: Temp:  [98 F (36.7 C)-99.2 F (37.3 C)] 99 F (37.2 C) (06/16 1119) Pulse Rate:  [64-115] 108  (06/16 1141) Resp:  [15-36] 18 (06/16 1141) BP: (121-158)/(82-108) 151/108 (06/16 1141) SpO2:  [90 %-99 %] 97 % (06/16 1141)  Intake/Output from previous day: 06/15 0701 - 06/16 0700 In: 830 [P.O.:480; IV Piggyback:350] Out: 1850 [Urine:1850] Intake/Output this shift: Total I/O In: -  Out: 1500 [Urine:1500]   Physical Exam: In no acute distress.  Foley urine clear.  Left nephrostomy draining blood-tinged urine.  Lab Results:  Recent Labs    12/25/18 0615 12/26/18 0255  WBC 8.5 9.2  HGB 8.3* 8.1*  HCT 27.8* 26.8*  PLT 128* 130*   BMET Recent Labs    12/25/18 0615 12/26/18 0255  NA 139 140  K 5.1 4.8  CL 102 105  CO2 23 20*  GLUCOSE 151* 141*  BUN 70* 75*  CREATININE 5.21* 5.66*  CALCIUM 8.2* 8.0*   PT/INR Recent Labs    12/26/18 0255  LABPROT 16.6*  INR 1.4*    Assessment: s/p left nephrostomy tube placement.  Stable  Plan: -Follow creatinine -Continue Foley catheter drainage for now    LOS: 1 day    Ronda Fairly [facility] 12/26/2018

## 2018-12-26 NOTE — Consult Note (Signed)
Consultation Note Date: 12/26/2018   Patient Name: Blake [name]  DOB: 05-14-1923  MRN: [id number]  Age / Sex: 85 y.o., male   PCP: Adin Hector, MD Referring Physician: Dustin Flock, MD   REASON FOR CONSULTATION:Establishing goals of care  Palliative Care consult requested for this 85 y.o. male with multiple medical problems including hyperlipidemia, hypertension, high-grade bladder cancer (incompletely resected tumor), prostate cancer H/P radiation therapy, Bilateral hydronephrosis, type 2 diabetes, abdominal aortic aneurysm, arrhythmias, pacemaker, kidney disease stage IV, and fibrillation.  Patient presented with shortness of breath and cough.  ED work-up identified findings consistent with acute diastolic CHF in the setting of chronic bilateral hydronephrosis.  Clinical Assessment and Goals of Care: I have reviewed medical records including lab results, imaging, Epic notes, and MAR, received report from the bedside RN, and assessed the patient. I met at the bedside with patient and his son Daemion Mcniel II to discuss diagnosis prognosis, GOC, EOL wishes, disposition and options.  Patient is s/p left nephrectomy placement.  He is somewhat drowsy.  Denies pain and improved shortness of breath.  I introduced Palliative Medicine as specialized medical care for people living with serious illness. It focuses on providing relief from the symptoms and stress of a serious illness. The goal is to improve quality of life for both the patient and the family.  We discussed a brief life review of the patient, along with functional and nutritional status. 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 He has 2 children. His daughter, whom patient lives with is a Pharmacist, community and his son resides in [place] and is a Company secretary. He has 4 grandchildren who are all in the medical field as well as his 2 brothers (doctors/dentist).   Son reports patient resides with  his sister, Dr. Lazarus Salines and has 24/7 hired caregivers in the home. Prior to admission patient was alert and oriented, ambulatory with walker and stand by assist. Son reports he also was participating in home PT/OT. He reports his father had a huge appetite and would often eat several meals a day with breakfast being his favorite and biggest meals. He required assistance with ADLs such as bathing and toileting.   We discussed His current illness and what it means in the larger context of His on-going co-morbidities. With specific discussions regarding his acute diastolic CHF, renal failure, recurrent UTIs, and hydronephrosis. Natural disease trajectory and expectations at EOL were discussed. Son verbalized understanding and appreciation. He shares that his father was planning to have bilateral nephrectomies placed prior to admission, however family wanted to delay to be allowed the opportunity to accommodate patient.   Son reports he has been a strong willed man and has taken great care of himself throughout life for the most part. He shares that his father has always wanted full aggressive treatment with every opportunity to live and thrive, even when he battled cancer.   I attempted to elicit values and goals of care important to the patient.    The difference between aggressive medical intervention and comfort care was considered in light of the patient's goals of care. Son reports he, his sister, and patient have been in conversations regarding his health with more attention focused on his renal failure and hydronephrosis. He reports they have researched treatments and watched videos about dialysis. He states in the event patient is at the point of needing dialysis they have pretty much decided if home  dialysis was an option they may would consider, especially if it was something that could be done in the evening or nights to allow him to sleep during the treatments. Ulrick states they are not at  this point considering dialysis at a treatment center for 3 days a week as patient would not like this and feels it would destroy his quality of life.    Advanced directives, concepts specific to code status, artifical feeding and hydration, and rehospitalization were considered and discussed. Son reports patient does have a documented advanced directive. He states he and his sister Brunetta Genera are the listed POAs.   I discussed in details patient current full code status with consideration to his current illness and co-morbidities. Patient is awake during conversation and states "keep me going, do what you can do until God says its time!" Son verbalizes agreement and shares that he has always said he would be ok with heroic measures with the ability of decisions to be made by his children after updates from the provider and full awareness that patient is in a comatose state or incurable. At that point his children can then make the decision to withdraw or intervene in care and treatments.   Hospice and Palliative Care services outpatient were explained and offered. Son verbalized his understanding and awareness of both palliative and hospice's goals and philosophy of care. He states that he thinks his sister has spoken to someone about Palliative previously and that they have a nurse coming out into the home currently making regular visits. He shares he would like to talk it over with her and if she decides this is needed they would be in contact with myself.   Questions and concerns were addressed.  Hard Choices booklet left for review. The family was encouraged to call with questions or concerns.  PMT will continue to support holistically.   SOCIAL HISTORY:     reports that he has quit smoking. He has never used smokeless tobacco. He reports that he does not drink alcohol or use drugs.  CODE STATUS: Full code  ADVANCE DIRECTIVES: NEXT OF KIN   SYMPTOM MANAGEMENT: PER ATTENDING   Palliative  Prophylaxis:   Aspiration, Bowel Regimen, Delirium Protocol, Frequent Pain Assessment, Oral Care and Turn Reposition  PSYCHO-SOCIAL/SPIRITUAL:  Support System: Family   Desire for further Chaplaincy support:No   Additional Recommendations (Limitations, Scope, Preferences):  Full Scope Treatment   PAST MEDICAL HISTORY: Past Medical History:  Diagnosis Date   AAA (abdominal aortic aneurysm) ([place])    Arrhythmia    Diabetes mellitus without complication ([place])    GI bleed    Heart murmur    Hyperlipidemia    Hypertension    Pacemaker    Prostate cancer ([place])     PAST SURGICAL HISTORY:  Past Surgical History:  Procedure Laterality Date   FLEXIBLE SIGMOIDOSCOPY N/A 10/21/2016   Procedure: FLEXIBLE SIGMOIDOSCOPY;  Surgeon: [name], MD;  Location: [facility] ENDOSCOPY;  Service: Endoscopy;  Laterality: N/A;   JOINT REPLACEMENT     PACEMAKER INSERTION Left 04/14/2015   Procedure: INSERTION PACEMAKER;  Surgeon: Isaias Cowman, MD;  Location: [facility] ORS;  Service: Cardiovascular;  Laterality: Left;   PROSTATE SURGERY     VISCERAL ARTERY INTERVENTION N/A 10/22/2016   Procedure: Visceral Artery Intervention;  Surgeon: Algernon Huxley, MD;  Location: [facility];  Service: Cardiovascular;  Laterality: N/A;    ALLERGIES:  is allergic to diovan [valsartan]; gabapentin; hydralazine; lisinopril; pregabalin; levofloxacin; and sulfamethoxazole-trimethoprim.   MEDICATIONS:  Current Facility-Administered Medications  Medication Dose Route Frequency Provider Last Rate Last Dose   0.9 %  sodium chloride infusion  250 mL Intravenous PRN Dustin Flock, MD       0.9 %  sodium chloride infusion   Intravenous Continuous Corrie Mckusick, DO   Stopped at 12/26/18 1023   acetaminophen (TYLENOL) tablet 650 mg  650 mg Oral Q6H PRN Dustin Flock, MD   650 mg at 12/25/18 1447   Or   acetaminophen (TYLENOL) suppository 650 mg  650 mg Rectal Q6H PRN Dustin Flock, MD        amLODipine (NORVASC) tablet 2.5 mg  2.5 mg Oral Daily Dustin Flock, MD   2.5 mg at 12/26/18 1150   [START ON 12/27/2018] ceFEPIme (MAXIPIME) 1 g in sodium chloride 0.9 % 100 mL IVPB  1 g Intravenous Q24H Dustin Flock, MD       fentaNYL (SUBLIMAZE) 100 MCG/2ML injection            finasteride (PROSCAR) tablet 5 mg  5 mg Oral Daily Dustin Flock, MD   5 mg at 12/26/18 1150   furosemide (LASIX) injection 40 mg  40 mg Intravenous Q12H Dustin Flock, MD   40 mg at 12/26/18 1151   [START ON 12/27/2018] heparin injection 5,000 Units  5,000 Units Subcutaneous Q8H Dustin Flock, MD       insulin aspart (novoLOG) injection 0-5 Units  0-5 Units Subcutaneous QHS Dustin Flock, MD       insulin aspart (novoLOG) injection 0-9 Units  0-9 Units Subcutaneous TID WC Dustin Flock, MD   2 Units at 12/25/18 1735   metoprolol tartrate (LOPRESSOR) tablet 25 mg  25 mg Oral BID Dustin Flock, MD   25 mg at 12/26/18 1151   midazolam (VERSED) 2 MG/2ML injection            ondansetron (ZOFRAN) tablet 4 mg  4 mg Oral Q6H PRN Dustin Flock, MD       Or   ondansetron (ZOFRAN) injection 4 mg  4 mg Intravenous Q6H PRN Dustin Flock, MD       pantoprazole (PROTONIX) EC tablet 40 mg  40 mg Oral Daily Dustin Flock, MD   40 mg at 12/26/18 1150   rosuvastatin (CRESTOR) tablet 5 mg  5 mg Oral QHS Dustin Flock, MD   5 mg at 12/25/18 2215   sodium bicarbonate tablet 650 mg  650 mg Oral BID Dustin Flock, MD   650 mg at 12/26/18 1150   sodium chloride flush (NS) 0.9 % injection 3 mL  3 mL Intravenous Q12H Dustin Flock, MD   3 mL at 12/26/18 1152   sodium chloride flush (NS) 0.9 % injection 3 mL  3 mL Intravenous PRN Dustin Flock, MD       tamsulosin (FLOMAX) capsule 0.4 mg  0.4 mg Oral Daily Dustin Flock, MD   0.4 mg at 12/25/18 1735    VITAL SIGNS: BP (!) 151/108 (BP Location: Left Arm)    Pulse (!) 108    Temp 99 F (37.2 C) (Oral)    Resp 18    Ht '6\' 2"'$  (1.88 m)    Wt 81.6 kg     SpO2 97%    BMI 23.11 kg/m  Filed Weights   12/25/18 0607  Weight: 81.6 kg    Estimated body mass index is 23.11 kg/m as calculated from the following:   Height as of this encounter: '6\' 2"'$  (1.88 m).   Weight as of this encounter: 81.6  kg.  LABS: CBC:    Component Value Date/Time   WBC 9.2 12/26/2018 0255   HGB 8.1 (L) 12/26/2018 0255   HGB 11.2 (L) 06/16/2014 1337   HCT 26.8 (L) 12/26/2018 0255   HCT 34.8 (L) 06/16/2014 1337   PLT 130 (L) 12/26/2018 0255   PLT 150 06/16/2014 1337   Comprehensive Metabolic Panel:    Component Value Date/Time   NA 140 12/26/2018 0255   NA 141 12/21/2018 1429   NA 137 06/16/2014 1337   K 4.8 12/26/2018 0255   K 4.3 06/16/2014 1337   CO2 20 (L) 12/26/2018 0255   CO2 27 06/16/2014 1337   BUN 75 (H) 12/26/2018 0255   BUN 68 (H) 12/21/2018 1429   BUN 29 (H) 06/16/2014 1337   CREATININE 5.66 (H) 12/26/2018 0255   CREATININE 1.48 (H) 06/16/2014 1337   ALBUMIN 3.0 (L) 12/25/2018 0615   ALBUMIN 4.0 12/29/2012 1722     Review of Systems  Unable to perform ROS: Acuity of condition   Unless otherwise noted, a complete review of systems is negative.  Physical Exam Vitals signs and nursing note reviewed.  Constitutional:      General: He is sleeping.     Appearance: He is well-developed.     Comments: Chronically ill appearing, drowsy s/p nephrectomy   Cardiovascular:     Rate and Rhythm: Regular rhythm. Tachycardia present.     Pulses: Normal pulses.     Heart sounds: Normal heart sounds.  Pulmonary:     Effort: Tachypnea present.     Breath sounds: Decreased breath sounds and rhonchi present.  Abdominal:     General: Bowel sounds are normal.     Palpations: Abdomen is soft.  Skin:    General: Skin is warm and dry.  Neurological:     Mental Status: He is easily aroused.     Comments: Drowsy     Prognosis: Unable to determine (Guarded) in the setting of CKD IV, acute renal failure with obstructive uropathy s/p left  nephrostomy, metabolic acidosis, hyponatremia, hypertension, UTI, diabetes, and shortness of breath.   Discharge Planning:  To Be Determined  Recommendations:  Full Code-as requested by patient and son  Continue with current care plan  Son request to discuss palliative with his sister, Dr. Primus Bravo and will be in contact if they are in agreement to outpatient palliative's support.   PMT will continue to support and follow as needed.    Palliative Performance Scale:  PPS 30%              Son expressed understanding and was in agreement with this plan.   Thank you for allowing the Palliative Medicine Team to assist in the care of this patient.  Time In: 1330 Time Out: 1435 Time Total: 65 min.   Visit consisted of counseling and education dealing with the complex and emotionally intense issues of symptom management and palliative care in the setting of serious and potentially life-threatening illness.Greater than 50%  of this time was spent counseling and coordinating care related to the above assessment and plan.  Signed by:  Alda Lea, AGPCNP-BC Palliative Medicine Team  Phone: [phone] Fax: [phone] Pager: [phone] Amion: Bjorn Pippin

## 2018-12-26 NOTE — Progress Notes (Signed)
[facility]  ROUNDING NOTE   Subjective:   Son at bedside.   Patient had left nephrostomy tube placed by IR this morning. Unable to get Right side due to shortness of breath.   Objective:  Vital signs in last 24 hours:  Temp:  [97.8 F (36.6 C)-99.2 F (37.3 C)] 99 F (37.2 C) (06/16 1119) Pulse Rate:  [64-115] 108 (06/16 1141) Resp:  [15-36] 18 (06/16 1141) BP: (121-158)/(82-108) 151/108 (06/16 1141) SpO2:  [90 %-100 %] 97 % (06/16 1141)  Weight change:  Filed Weights   12/25/18 0607  Weight: 81.6 kg    Intake/Output: I/O last 3 completed shifts: In: 46 [P.O.:480; IV Piggyback:350] Out: 1850 [Urine:1850]   Intake/Output this shift:  Total I/O In: -  Out: 600 [Urine:600]  Physical Exam: General: NAD,   Head: Normocephalic, atraumatic. Moist oral mucosal membranes  Eyes: Anicteric, PERRL  Neck: Supple, trachea midline  Lungs:  Clear to auscultation  Heart: Regular rate and rhythm  Abdomen:  Soft, nontender,   Extremities:  trace  peripheral edema.  Neurologic: Nonfocal, moving all four extremities  Skin: No lesions   GU Left percutaneous nephrostomy tube    Basic Metabolic Panel: Recent Labs  Lab 12/21/18 1429 12/25/18 0615 12/26/18 0255  NA 141 139 140  K 4.7 5.1 4.8  CL 102 102 105  CO2 20 23 20*  GLUCOSE 147* 151* 141*  BUN 68* 70* 75*  CREATININE 5.68* 5.21* 5.66*  CALCIUM 8.3* 8.2* 8.0*    Liver Function Tests: Recent Labs  Lab 12/25/18 0615  AST 16  ALT 18  ALKPHOS 67  BILITOT 0.5  PROT 7.8  ALBUMIN 3.0*   No results for input(s): LIPASE, AMYLASE in the last 168 hours. No results for input(s): AMMONIA in the last 168 hours.  CBC: Recent Labs  Lab 12/25/18 0615 12/26/18 0255  WBC 8.5 9.2  NEUTROABS 6.9  --   HGB 8.3* 8.1*  HCT 27.8* 26.8*  MCV 88.3 89.0  PLT 128* 130*    Cardiac Enzymes: Recent Labs  Lab 12/25/18 0615  TROPONINI 0.06*    BNP: Invalid input(s): POCBNP  CBG: Recent Labs  Lab  12/25/18 1325 12/25/18 1618 12/25/18 2103 12/26/18 0757 12/26/18 1149  GLUCAP 117* 164* 135* 129* 124*    Microbiology: Results for orders placed or performed during the hospital encounter of 12/25/18  Culture, blood (routine x 2)     Status: None (Preliminary result)   Collection Time: 12/25/18  6:15 AM   Specimen: BLOOD  Result Value Ref Range Status   Specimen Description BLOOD LEFT ANTECUBITAL  Final   Special Requests   Final    BOTTLES DRAWN AEROBIC AND ANAEROBIC Blood Culture adequate volume   Culture   Final    NO GROWTH < 24 HOURS Performed at [facility], [address] [place], [place] [address]    Report Status PENDING  Incomplete  Culture, blood (routine x 2)     Status: None (Preliminary result)   Collection Time: 12/25/18  6:15 AM   Specimen: BLOOD  Result Value Ref Range Status   Specimen Description BLOOD RIGHT FOREARM  Final   Special Requests   Final    BOTTLES DRAWN AEROBIC AND ANAEROBIC Blood Culture results may not be optimal due to an excessive volume of blood received in culture bottles   Culture  Setup Time   Final    Organism ID to follow GRAM NEGATIVE RODS AEROBIC BOTTLE ONLY CRITICAL RESULT CALLED TO,  READ BACK BY AND VERIFIED WITH:  DAVID BESANTI PHARMD 0300 12/26/2018 HNM    Culture   Final    NO GROWTH < 24 HOURS Performed at [facility], [address] [place], [place] [address]    Report Status PENDING  Incomplete  SARS Coronavirus 2 (CEPHEID- Performed in [place] hospital lab), Hosp Order     Status: None   Collection Time: 12/25/18  6:15 AM   Specimen: Nasopharyngeal  Result Value Ref Range Status   SARS Coronavirus 2 NEGATIVE NEGATIVE Final    Comment: (NOTE) If result is NEGATIVE SARS-CoV-2 target nucleic acids are NOT DETECTED. The SARS-CoV-2 RNA is generally detectable in upper and lower  respiratory specimens during the acute phase of infection. The lowest  concentration of SARS-CoV-2 viral copies  this assay can detect is 250  copies / mL. A negative result does not preclude SARS-CoV-2 infection  and should not be used as the sole basis for treatment or other  patient management decisions.  A negative result may occur with  improper specimen collection / handling, submission of specimen other  than nasopharyngeal swab, presence of viral mutation(s) within the  areas targeted by this assay, and inadequate number of viral copies  (<250 copies / mL). A negative result must be combined with clinical  observations, patient history, and epidemiological information. If result is POSITIVE SARS-CoV-2 target nucleic acids are DETECTED. The SARS-CoV-2 RNA is generally detectable in upper and lower  respiratory specimens dur ing the acute phase of infection.  Positive  results are indicative of active infection with SARS-CoV-2.  Clinical  correlation with patient history and other diagnostic information is  necessary to determine patient infection status.  Positive results do  not rule out bacterial infection or co-infection with other viruses. If result is PRESUMPTIVE POSTIVE SARS-CoV-2 nucleic acids MAY BE PRESENT.   A presumptive positive result was obtained on the submitted specimen  and confirmed on repeat testing.  While 2019 novel coronavirus  (SARS-CoV-2) nucleic acids may be present in the submitted sample  additional confirmatory testing may be necessary for epidemiological  and / or clinical management purposes  to differentiate between  SARS-CoV-2 and other Sarbecovirus currently known to infect humans.  If clinically indicated additional testing with an alternate test  methodology [phone]) is advised. The SARS-CoV-2 RNA is generally  detectable in upper and lower respiratory sp ecimens during the acute  phase of infection. The expected result is Negative. Fact Sheet for Patients:  StrictlyIdeas.no Fact Sheet for Healthcare  Providers: BankingDealers.co.za This test is not yet approved or cleared by the [place] FDA and has been authorized for detection and/or diagnosis of SARS-CoV-2 by FDA under an Emergency Use Authorization (EUA).  This EUA will remain in effect (meaning this test can be used) for the duration of the COVID-19 declaration under Section 564(b)(1) of the Act, 21 U.S.C. section 360bbb-3(b)(1), unless the authorization is terminated or revoked sooner. Performed at [facility], [address] [place], [place] [address]   Blood Culture ID Panel (Reflexed)     Status: Abnormal   Collection Time: 12/25/18  6:15 AM  Result Value Ref Range Status   Enterococcus species NOT DETECTED NOT DETECTED Final   Listeria monocytogenes NOT DETECTED NOT DETECTED Final   Staphylococcus species NOT DETECTED NOT DETECTED Final   Staphylococcus aureus (BCID) NOT DETECTED NOT DETECTED Final   Streptococcus species NOT DETECTED NOT DETECTED Final   Streptococcus agalactiae NOT DETECTED NOT DETECTED Final  Streptococcus pneumoniae NOT DETECTED NOT DETECTED Final   Streptococcus pyogenes NOT DETECTED NOT DETECTED Final   Acinetobacter baumannii NOT DETECTED NOT DETECTED Final   Enterobacteriaceae species NOT DETECTED NOT DETECTED Final   Enterobacter cloacae complex NOT DETECTED NOT DETECTED Final   Escherichia coli NOT DETECTED NOT DETECTED Final   Klebsiella oxytoca NOT DETECTED NOT DETECTED Final   Klebsiella pneumoniae NOT DETECTED NOT DETECTED Final   Proteus species NOT DETECTED NOT DETECTED Final   Serratia marcescens NOT DETECTED NOT DETECTED Final   Carbapenem resistance NOT DETECTED NOT DETECTED Final   Haemophilus influenzae NOT DETECTED NOT DETECTED Final   Neisseria meningitidis NOT DETECTED NOT DETECTED Final   Pseudomonas aeruginosa DETECTED (A) NOT DETECTED Final    Comment: CRITICAL RESULT CALLED TO, READ BACK BY AND VERIFIED WITH: DAVID BESANTI PHARMD 0300  12/26/2018 HNM    Candida albicans NOT DETECTED NOT DETECTED Final   Candida glabrata NOT DETECTED NOT DETECTED Final   Candida krusei NOT DETECTED NOT DETECTED Final   Candida parapsilosis NOT DETECTED NOT DETECTED Final   Candida tropicalis NOT DETECTED NOT DETECTED Final    Comment: Performed at [facility], [address] [place], [place] [address]    Coagulation Studies: Recent Labs    12/26/18 0255  LABPROT 16.6*  INR 1.4*    Urinalysis: Recent Labs    12/25/18 0615  COLORURINE YELLOW*  LABSPEC 1.010  PHURINE 6.0  GLUCOSEU NEGATIVE  HGBUR SMALL*  BILIRUBINUR NEGATIVE  KETONESUR NEGATIVE  PROTEINUR 30*  NITRITE NEGATIVE  LEUKOCYTESUR LARGE*      Imaging: Ct Abdomen Pelvis Wo Contrast  Result Date: 12/25/2018 CLINICAL DATA:  Shortness of breath and cough, weakness. Bilateral hydronephrosis EXAM: CT ABDOMEN AND PELVIS WITHOUT CONTRAST TECHNIQUE: Multidetector CT imaging of the abdomen and pelvis was performed following the standard protocol without IV contrast. COMPARISON:  CT abdomen dated 11/06/2018. Chest x-ray from earlier same day. FINDINGS: Lower chest: Bibasilar pleural effusions, at least moderate in size, incompletely imaged with associated compressive atelectasis. Hepatobiliary: Stable small hypodensity within the RIGHT liver lobe, too small to definitively characterize but most likely a cyst. No new liver findings. Gallbladder appears normal. No bile duct dilatation seen. Pancreas: Unremarkable. No pancreatic ductal dilatation or surrounding inflammatory changes. Spleen: Normal in size without focal abnormality. Adrenals/Urinary Tract: Bilateral hydronephrosis, moderate to severe in degree, not significantly changed compared to the previous study. Stable bilateral hydroureter. Bladder is decompressed by Foley catheter. Stomach/Bowel: No dilated large or small bowel loops. Stomach is unremarkable, partially decompressed. Moderate amount of stool in the  rectal vault with commensurate distension. Diffuse colonic diverticulosis but no focal inflammatory change to suggest acute diverticulitis. Appendix is not seen but there are no inflammatory changes in the RIGHT lower quadrant to suggest acute appendicitis. Vascular/Lymphatic: Infrarenal abdominal aortic aneurysm, measuring 5.2 x 5 cm, stable in the short-term interval. Extensive atherosclerosis of the abdominal aorta and branch vessels. Extensive atherosclerosis of the pelvic vasculature. No acute findings. No enlarged lymph nodes seen. Reproductive: Brachytherapy seeds in place. Other: No free fluid or abscess collection seen. No free intraperitoneal air. Musculoskeletal: No acute or suspicious osseous finding. IMPRESSION: 1. New bibasilar pleural effusions, at least moderate in size, incompletely imaged, with associated compressive atelectasis. 2. Bilateral hydronephrosis and hydroureter, moderate to severe in degree, not significantly changed compared to the previous study. Bladder is decompressed by Foley catheter. 3. Colonic diverticulosis without evidence of acute diverticulitis. 4. Infrarenal abdominal aortic aneurysm, measuring 5.2 cm, stable in the short-term  interval. No periaortic hemorrhage or inflammation. Recommend followup by abdomen and pelvis CTA in 3-6 months, and vascular surgery referral/consultation if not already obtained. This recommendation follows ACR consensus guidelines: White Paper of the ACR Incidental Findings Committee II on Vascular Findings. J Am Coll Radiol 2013; 10:789-794. Electronically Signed   By: Franki Cabot M.D.   On: 12/25/2018 10:33   [place] Intraoperative  Result Date: 12/26/2018 CLINICAL DATA:  Ultrasound was provided for use by the ordering physician, and a technical charge was applied by the performing facility.  No radiologist interpretation/professional services rendered.   Dg Chest Port 1 View  Result Date: 12/25/2018 CLINICAL DATA:  Shortness of breath. EXAM:  PORTABLE CHEST 1 VIEW COMPARISON:  11/05/2018. FINDINGS: Cardiac pacer noted with lead tip over the right atrium right ventricle. Heart size stable. Bibasilar infiltrates/edema and small bilateral pleural effusions. Bibasilar atelectasis. No pneumothorax. IMPRESSION: 1.  Cardiac pacer stable position. 2. Bibasilar infiltrates/edema and small bilateral pleural effusions. Findings have increased from prior exam. Electronically Signed   By: [place]   On: 12/25/2018 07:07   Ct Image Guided Drainage By Percutaneous Catheter  Result Date: 12/26/2018 INDICATION: 85 year old male with a history of bilateral ureteral obstruction. EXAM: CT AND ULTRASOUND GUIDED DRAINAGE OF LEFT KIDNEY MEDICATIONS: The patient is currently admitted to the hospital and receiving intravenous antibiotics. The antibiotics were administered within an appropriate time frame prior to the initiation of the procedure. ANESTHESIA/SEDATION: 1.0 mg IV Versed 50 mcg IV Fentanyl Moderate Sedation Time:  21 minutes The patient was continuously monitored during the procedure by the interventional radiology nurse under my direct supervision. COMPLICATIONS: None TECHNIQUE: Informed written consent was obtained from the patient after a thorough discussion of the procedural risks, benefits and alternatives. All questions were addressed. Maximal Sterile Barrier Technique was utilized including caps, mask, sterile gowns, sterile gloves, sterile drape, hand hygiene and skin antiseptic. A timeout was performed prior to the initiation of the procedure. PROCEDURE: Informed written consent was obtained from the patient's family after a thorough discussion of the procedural risks, benefits and alternatives. All questions were addressed. Maximal Sterile Barrier Technique was utilized including caps, mask, sterile gowns, sterile gloves, sterile drape, hand hygiene and skin antiseptic. A timeout was performed prior to the initiation of the procedure. Patient  positioned right decubitus position on the CT table. Scout CT was performed for planning purposes. Ultrasound survey of the left flank was performed with images stored and sent to PACs. The patient was then prepped and draped in the usual sterile fashion. 1% lidocaine was used to anesthetize the skin and subcutaneous tissues for local anesthesia. An 18 gauge trocar needle was then used to access a posterior inferior calyx with ultrasound guidance. With spontaneous urine returned through the needle, 035 wire was passed into the collecting system. CT was performed to confirm location. A small incision was made with an 11 blade scalpel, and the needle was removed from the wire. Ten French dilation was performed. Ten [place] drain was then advanced over the wire into the collecting system. Wire was removed.  Catheter pigtail was locked.  CT was performed. Catheter was sutured to the skin and attached to gravity drainage. Patient tolerated the procedure well and remained hemodynamically stable throughout. No complications were encountered and no significant blood loss encountered FINDINGS: Scout CT in right decubitus position demonstrates persisting left-sided hydronephrosis. Images during the case demonstrate placement of 10 French drain through inferior calyx into the collecting system. IMPRESSION: Status post CT-guided placement of  left-sided percutaneous nephrostomy. Signed, Dulcy Fanny. Dellia Nims, RPVI [facility] Radiology PLAN: A single left-sided percutaneous access was performed given the need for positioning in the right decubitus position secondary to the patient's respiratory status. If need be, we may re-evaluate for possible right-sided percutaneous nephrostomy. Electronically Signed   By: Corrie Mckusick D.O.   On: 12/26/2018 11:30     Medications:   . sodium chloride    . sodium chloride Stopped (12/26/18 1023)  . [START ON 12/27/2018] ceFEPime (MAXIPIME)  IV     . amLODipine  2.5 mg Oral Daily  . fentaNYL      . finasteride  5 mg Oral Daily  . furosemide  40 mg Intravenous Q12H  . [START ON 12/27/2018] heparin injection (subcutaneous)  5,000 Units Subcutaneous Q8H  . insulin aspart  0-5 Units Subcutaneous QHS  . insulin aspart  0-9 Units Subcutaneous TID WC  . metoprolol tartrate  25 mg Oral BID  . midazolam      . pantoprazole  40 mg Oral Daily  . rosuvastatin  5 mg Oral QHS  . sodium bicarbonate  650 mg Oral BID  . sodium chloride flush  3 mL Intravenous Q12H  . tamsulosin  0.4 mg Oral Daily   sodium chloride, acetaminophen **OR** acetaminophen, ondansetron **OR** ondansetron (ZOFRAN) IV, sodium chloride flush  Assessment/ Plan:  [name] is a 85 y.o. black male with high-grade bladder cancer, incompletely resected tumor, severe bilateral hydronephrosis, history of prostate cancer, recurrent UTIs, atrial fibrillation, pacemaker/defibrillator, diabetes mellitus type II who is admitted to [facility] on 12/25/2018 for bilateral hydronephrosis  1. Acute renal failure with obstructive uropathy. With metabolic acidosis On Chronic kidney disease stage IV.  Baseline creatinine 2.22, GFR of 28 09/13/2018 - sodium bicarbonate PO - status post left percutaneous nephrostomy tube placement by IR on 6/16.  - Appreciate urology input. Recommend right sided nephrostomy tube once patient is stable.   2. Hypertension with diastolic congestive heart failure:  - IV furosemide - metoprolol, tamsulosin  3. Urinary tract infection: blood cultures with Klebsiella and Enterobacteriaceae  - empiric azithromycin and ceftriaxone.    LOS: 1 Blake [name] 6/16/202012:11 PM

## 2018-12-26 NOTE — Progress Notes (Signed)
Report from [facility]. Patient already off unit for procedure. Will assess when he returns.

## 2018-12-26 NOTE — Progress Notes (Signed)
Patient with 6 beats of VT followed by NSR per CCMD. Asymptomatic, sleeping. Dr. Posey Pronto notified, no new verbal orders. Will continue to monitor.

## 2018-12-26 NOTE — Procedures (Signed)
Interventional Radiology Procedure Note  Procedure: US/CT guided left PCN.  To gravity drainage The decision for single left was made because of the patients uremic status and concern for the airway, and procedure was performed in right decubitus.  The left kidney was selected because of the greater perceived hydro and the greater parenchymal tissue..  Complications: None Recommendations:  - To gravity.  Record output x TID - Do not submerge - Routine care   Signed,  Dulcy Fanny. Earleen Newport, DO

## 2018-12-26 NOTE — Progress Notes (Signed)
PHARMACY - PHYSICIAN COMMUNICATION CRITICAL VALUE ALERT - BLOOD CULTURE IDENTIFICATION (BCID)  Blake [name] is an 85 y.o. male who presented to [facility] on 12/25/2018 with a chief complaint of respiratory distress w/ h/o CHF and fluid retention s/t bilateral hydronephrosis.  Assessment:  Patient hypertensive s/t possible CHF exacerbation vitals otherwise WNL, LA negative, PCT negative, UA leuk +, WBC UA > 50, 1/4 GNR BCID Pseudomonas aeruginosa KPC negative. Of note patient has had multiple Ucx positive for pseudomonas in 11/21/18 w/ a pseudomonas intermediate only to fluoroquinolones.  Name of physician (or Provider) Contacted: Rosilyn Mings  Current antibiotics: azithromycin/ceftriaxone  Changes to prescribed antibiotics recommended:  Recommendations accepted by provider -- Will d/c azithromycin/ceftriaxone and start patient on cefepime 2g IV q12h. Per patient's CrCl he would have been dosed cefepime 1g q24h, but considering the MDRs of pseudomonas will dose more aggressively. Patient has CKD IV w/ a CrCl currently of 10 ml/min not on dialysis. Per nephrology patient is going to undergo IR guided nephrostomy tube in the hopes of resolving the hydronephrosis; therefore, except renal function to improve. Will continue to monitor renal fx and adjust as necessary.  Results for orders placed or performed during the hospital encounter of 12/25/18  Blood Culture ID Panel (Reflexed) (Collected: 12/25/2018  6:15 AM)  Result Value Ref Range   Enterococcus species NOT DETECTED NOT DETECTED   Listeria monocytogenes NOT DETECTED NOT DETECTED   Staphylococcus species NOT DETECTED NOT DETECTED   Staphylococcus aureus (BCID) NOT DETECTED NOT DETECTED   Streptococcus species NOT DETECTED NOT DETECTED   Streptococcus agalactiae NOT DETECTED NOT DETECTED   Streptococcus pneumoniae NOT DETECTED NOT DETECTED   Streptococcus pyogenes NOT DETECTED NOT DETECTED   Acinetobacter baumannii NOT DETECTED NOT  DETECTED   Enterobacteriaceae species NOT DETECTED NOT DETECTED   Enterobacter cloacae complex NOT DETECTED NOT DETECTED   Escherichia coli NOT DETECTED NOT DETECTED   Klebsiella oxytoca NOT DETECTED NOT DETECTED   Klebsiella pneumoniae NOT DETECTED NOT DETECTED   Proteus species NOT DETECTED NOT DETECTED   Serratia marcescens NOT DETECTED NOT DETECTED   Carbapenem resistance NOT DETECTED NOT DETECTED   Haemophilus influenzae NOT DETECTED NOT DETECTED   Neisseria meningitidis NOT DETECTED NOT DETECTED   Pseudomonas aeruginosa DETECTED (A) NOT DETECTED   Candida albicans NOT DETECTED NOT DETECTED   Candida glabrata NOT DETECTED NOT DETECTED   Candida krusei NOT DETECTED NOT DETECTED   Candida parapsilosis NOT DETECTED NOT DETECTED   Candida tropicalis NOT DETECTED NOT DETECTED   Tobie Lords, PharmD, BCPS Clinical Pharmacist 12/26/2018  3:54 AM

## 2018-12-26 NOTE — Plan of Care (Signed)
  Problem: Clinical Measurements: Goal: Ability to maintain clinical measurements within normal limits will improve Outcome: Progressing Goal: Will remain free from infection Outcome: Progressing Goal: Respiratory complications will improve Outcome: Progressing   Problem: Nutrition: Goal: Adequate nutrition will be maintained Outcome: Progressing   Problem: Coping: Goal: Level of anxiety will decrease Outcome: Progressing   Problem: Pain Managment: Goal: General experience of comfort will improve Outcome: Progressing   Problem: Safety: Goal: Ability to remain free from injury will improve Outcome: Progressing   Problem: Clinical Measurements: Goal: Complications related to the disease process, condition or treatment will be avoided or minimized Outcome: Progressing

## 2018-12-26 NOTE — Progress Notes (Signed)
Patient back in room following left nephrostomy tube placement. Son at bedside, per not yesterday he is allowed to visit due to pt's mental capacity. BP elevated. Dr. Juleen China aware, ok to give missed morning meds. MD to round shortly. Will continue to monitor. No acute complaints.

## 2018-12-26 NOTE — Plan of Care (Signed)
  Problem: Clinical Measurements: Goal: Respiratory complications will improve Outcome: Progressing   Problem: Elimination: Goal: Will not experience complications related to urinary retention Outcome: Progressing   Problem: Skin Integrity: Goal: Risk for impaired skin integrity will decrease Outcome: Progressing   Problem: Clinical Measurements: Goal: Complications related to the disease process, condition or treatment will be avoided or minimized Outcome: Progressing

## 2018-12-26 NOTE — Progress Notes (Signed)
[place] at [facility]                                                                                                                                                                                  Patient Demographics   Blake [name], is a 85 y.o. male, DOB - 17-Oct-1922, AQT:[id number]  Admit date - 12/25/2018   Admitting Physician Dustin Flock, MD  Outpatient Primary MD for the patient is Blake High III, MD   LOS - 1  Subjective: Patient currently drowsy had nephrostomy tube placed earlier today on one side Son is at the bedside   Review of Systems:   CONSTITUTIONAL: Unable to provide due to receiving medications for the procedure  Vitals:   Vitals:   12/26/18 1033 12/26/18 1043 12/26/18 1119 12/26/18 1141  BP: (!) 158/92 (!) 149/96  (!) 151/108  Pulse:    (!) 108  Resp:    18  Temp: 98.9 F (37.2 C)  99 F (37.2 C)   TempSrc: Oral  Oral   SpO2:    97%  Weight:      Height:        Wt Readings from Last 3 Encounters:  12/25/18 81.6 kg  11/21/18 99.2 kg  11/05/18 98.8 kg     Intake/Output Summary (Last 24 hours) at 12/26/2018 1459 Last data filed at 12/26/2018 1310 Gross per 24 hour  Intake 830 ml  Output 3000 ml  Net -2170 ml    Physical Exam:   GENERAL: Pleasant-appearing in no apparent distress.  Hardly drowsy HEAD, EYES, EARS, NOSE AND THROAT: Atraumatic, normocephalic. Extraocular muscles are intact. Pupils equal and reactive to light. Sclerae anicteric. No conjunctival injection. No oro-pharyngeal erythema.  NECK: Supple. There is no jugular venous distention. No bruits, no lymphadenopathy, no thyromegaly.  HEART: Regular rate and rhythm,. No murmurs, no rubs, no clicks.  LUNGS: Clear to auscultation bilaterally. No rales or rhonchi. No wheezes.  ABDOMEN: Soft, flat, nontender, nondistended. Has good bowel sounds. No hepatosplenomegaly appreciated.  EXTREMITIES: No evidence of any cyanosis, clubbing, or peripheral edema.   +2 pedal and radial pulses bilaterally.  NEUROLOGIC: T patient is drowsy SKIN: Moist and warm with no rashes appreciated.  Psych: Not anxious, depressed LN: No inguinal LN enlargement    Antibiotics   Anti-infectives (From admission, onward)   Start     Dose/Rate Route Frequency Ordered Stop   12/27/18 1000  ceFEPIme (MAXIPIME) 1 g in sodium chloride 0.9 % 100 mL IVPB     1 g 200 mL/hr over 30 Minutes Intravenous Every 24 hours 12/26/18 0839     12/26/18 0900  cefTRIAXone (ROCEPHIN) 1 g in  sodium chloride 0.9 % 100 mL IVPB  Status:  Discontinued     1 g 200 mL/hr over 30 Minutes Intravenous Every 24 hours 12/25/18 0910 12/26/18 0354   12/26/18 0900  azithromycin (ZITHROMAX) 500 mg in sodium chloride 0.9 % 250 mL IVPB  Status:  Discontinued     500 mg 250 mL/hr over 60 Minutes Intravenous Every 24 hours 12/25/18 0910 12/26/18 0354   12/26/18 0400  ceFEPIme (MAXIPIME) 2 g in sodium chloride 0.9 % 100 mL IVPB  Status:  Discontinued     2 g 200 mL/hr over 30 Minutes Intravenous Every 12 hours 12/26/18 0354 12/26/18 0839   12/25/18 0830  cefTRIAXone (ROCEPHIN) 1 g in sodium chloride 0.9 % 100 mL IVPB     1 g 200 mL/hr over 30 Minutes Intravenous  Once 12/25/18 0815 12/25/18 0936   12/25/18 0830  azithromycin (ZITHROMAX) 500 mg in sodium chloride 0.9 % 250 mL IVPB     500 mg 250 mL/hr over 60 Minutes Intravenous  Once 12/25/18 0815 12/25/18 1127      Medications   Scheduled Meds: . amLODipine  2.5 mg Oral Daily  . fentaNYL      . finasteride  5 mg Oral Daily  . furosemide  40 mg Intravenous Q12H  . [START ON 12/27/2018] heparin injection (subcutaneous)  5,000 Units Subcutaneous Q8H  . insulin aspart  0-5 Units Subcutaneous QHS  . insulin aspart  0-9 Units Subcutaneous TID WC  . metoprolol tartrate  25 mg Oral BID  . midazolam      . pantoprazole  40 mg Oral Daily  . rosuvastatin  5 mg Oral QHS  . sodium bicarbonate  650 mg Oral BID  . sodium chloride flush  3 mL Intravenous  Q12H  . tamsulosin  0.4 mg Oral Daily   Continuous Infusions: . sodium chloride    . sodium chloride Stopped (12/26/18 1023)  . [START ON 12/27/2018] ceFEPime (MAXIPIME) IV     PRN Meds:.sodium chloride, acetaminophen **OR** acetaminophen, ondansetron **OR** ondansetron (ZOFRAN) IV, sodium chloride flush   Data Review:   Micro Results Recent Results (from the past 240 hour(s))  Culture, blood (routine x 2)     Status: None (Preliminary result)   Collection Time: 12/25/18  6:15 AM   Specimen: BLOOD  Result Value Ref Range Status   Specimen Description BLOOD LEFT ANTECUBITAL  Final   Special Requests   Final    BOTTLES DRAWN AEROBIC AND ANAEROBIC Blood Culture adequate volume   Culture   Final    NO GROWTH < 24 HOURS Performed at [facility], [address] [place], [place] [address]    Report Status PENDING  Incomplete  Culture, blood (routine x 2)     Status: None (Preliminary result)   Collection Time: 12/25/18  6:15 AM   Specimen: BLOOD  Result Value Ref Range Status   Specimen Description BLOOD RIGHT FOREARM  Final   Special Requests   Final    BOTTLES DRAWN AEROBIC AND ANAEROBIC Blood Culture results may not be optimal due to an excessive volume of blood received in culture bottles   Culture  Setup Time   Final    Organism ID to follow GRAM NEGATIVE RODS AEROBIC BOTTLE ONLY CRITICAL RESULT CALLED TO, READ BACK BY AND VERIFIED WITH:  DAVID BESANTI PHARMD 0300 12/26/2018 HNM    Culture   Final    NO GROWTH < 24 HOURS Performed at [facility], [place],  [place], [place] [address]    Report Status PENDING  Incomplete  SARS Coronavirus 2 (CEPHEID- Performed in [place] hospital lab), Hosp Order     Status: None   Collection Time: 12/25/18  6:15 AM   Specimen: Nasopharyngeal  Result Value Ref Range Status   SARS Coronavirus 2 NEGATIVE NEGATIVE Final    Comment: (NOTE) If result is NEGATIVE SARS-CoV-2 target nucleic acids are NOT  DETECTED. The SARS-CoV-2 RNA is generally detectable in upper and lower  respiratory specimens during the acute phase of infection. The lowest  concentration of SARS-CoV-2 viral copies this assay can detect is 250  copies / mL. A negative result does not preclude SARS-CoV-2 infection  and should not be used as the sole basis for treatment or other  patient management decisions.  A negative result may occur with  improper specimen collection / handling, submission of specimen other  than nasopharyngeal swab, presence of viral mutation(s) within the  areas targeted by this assay, and inadequate number of viral copies  (<250 copies / mL). A negative result must be combined with clinical  observations, patient history, and epidemiological information. If result is POSITIVE SARS-CoV-2 target nucleic acids are DETECTED. The SARS-CoV-2 RNA is generally detectable in upper and lower  respiratory specimens dur ing the acute phase of infection.  Positive  results are indicative of active infection with SARS-CoV-2.  Clinical  correlation with patient history and other diagnostic information is  necessary to determine patient infection status.  Positive results do  not rule out bacterial infection or co-infection with other viruses. If result is PRESUMPTIVE POSTIVE SARS-CoV-2 nucleic acids MAY BE PRESENT.   A presumptive positive result was obtained on the submitted specimen  and confirmed on repeat testing.  While 2019 novel coronavirus  (SARS-CoV-2) nucleic acids may be present in the submitted sample  additional confirmatory testing may be necessary for epidemiological  and / or clinical management purposes  to differentiate between  SARS-CoV-2 and other Sarbecovirus currently known to infect humans.  If clinically indicated additional testing with an alternate test  methodology [phone]) is advised. The SARS-CoV-2 RNA is generally  detectable in upper and lower respiratory sp ecimens during  the acute  phase of infection. The expected result is Negative. Fact Sheet for Patients:  StrictlyIdeas.no Fact Sheet for Healthcare Providers: BankingDealers.co.za This test is not yet approved or cleared by the [place] FDA and has been authorized for detection and/or diagnosis of SARS-CoV-2 by FDA under an Emergency Use Authorization (EUA).  This EUA will remain in effect (meaning this test can be used) for the duration of the COVID-19 declaration under Section 564(b)(1) of the Act, 21 U.S.C. section 360bbb-3(b)(1), unless the authorization is terminated or revoked sooner. Performed at [facility], [address] [place], [place] [address]   Blood Culture ID Panel (Reflexed)     Status: Abnormal   Collection Time: 12/25/18  6:15 AM  Result Value Ref Range Status   Enterococcus species NOT DETECTED NOT DETECTED Final   Listeria monocytogenes NOT DETECTED NOT DETECTED Final   Staphylococcus species NOT DETECTED NOT DETECTED Final   Staphylococcus aureus (BCID) NOT DETECTED NOT DETECTED Final   Streptococcus species NOT DETECTED NOT DETECTED Final   Streptococcus agalactiae NOT DETECTED NOT DETECTED Final   Streptococcus pneumoniae NOT DETECTED NOT DETECTED Final   Streptococcus pyogenes NOT DETECTED NOT DETECTED Final   Acinetobacter baumannii NOT DETECTED NOT DETECTED Final   Enterobacteriaceae species NOT DETECTED NOT DETECTED Final  Enterobacter cloacae complex NOT DETECTED NOT DETECTED Final   Escherichia coli NOT DETECTED NOT DETECTED Final   Klebsiella oxytoca NOT DETECTED NOT DETECTED Final   Klebsiella pneumoniae NOT DETECTED NOT DETECTED Final   Proteus species NOT DETECTED NOT DETECTED Final   Serratia marcescens NOT DETECTED NOT DETECTED Final   Carbapenem resistance NOT DETECTED NOT DETECTED Final   Haemophilus influenzae NOT DETECTED NOT DETECTED Final   Neisseria meningitidis NOT DETECTED NOT DETECTED  Final   Pseudomonas aeruginosa DETECTED (A) NOT DETECTED Final    Comment: CRITICAL RESULT CALLED TO, READ BACK BY AND VERIFIED WITH: DAVID BESANTI PHARMD 0300 12/26/2018 HNM    Candida albicans NOT DETECTED NOT DETECTED Final   Candida glabrata NOT DETECTED NOT DETECTED Final   Candida krusei NOT DETECTED NOT DETECTED Final   Candida parapsilosis NOT DETECTED NOT DETECTED Final   Candida tropicalis NOT DETECTED NOT DETECTED Final    Comment: Performed at [facility], [address] [place], [place] [address]    Radiology Reports Ct Abdomen Pelvis Wo Contrast  Result Date: 12/25/2018 CLINICAL DATA:  Shortness of breath and cough, weakness. Bilateral hydronephrosis EXAM: CT ABDOMEN AND PELVIS WITHOUT CONTRAST TECHNIQUE: Multidetector CT imaging of the abdomen and pelvis was performed following the standard protocol without IV contrast. COMPARISON:  CT abdomen dated 11/06/2018. Chest x-ray from earlier same day. FINDINGS: Lower chest: Bibasilar pleural effusions, at least moderate in size, incompletely imaged with associated compressive atelectasis. Hepatobiliary: Stable small hypodensity within the RIGHT liver lobe, too small to definitively characterize but most likely a cyst. No new liver findings. Gallbladder appears normal. No bile duct dilatation seen. Pancreas: Unremarkable. No pancreatic ductal dilatation or surrounding inflammatory changes. Spleen: Normal in size without focal abnormality. Adrenals/Urinary Tract: Bilateral hydronephrosis, moderate to severe in degree, not significantly changed compared to the previous study. Stable bilateral hydroureter. Bladder is decompressed by Foley catheter. Stomach/Bowel: No dilated large or small bowel loops. Stomach is unremarkable, partially decompressed. Moderate amount of stool in the rectal vault with commensurate distension. Diffuse colonic diverticulosis but no focal inflammatory change to suggest acute diverticulitis. Appendix is not  seen but there are no inflammatory changes in the RIGHT lower quadrant to suggest acute appendicitis. Vascular/Lymphatic: Infrarenal abdominal aortic aneurysm, measuring 5.2 x 5 cm, stable in the short-term interval. Extensive atherosclerosis of the abdominal aorta and branch vessels. Extensive atherosclerosis of the pelvic vasculature. No acute findings. No enlarged lymph nodes seen. Reproductive: Brachytherapy seeds in place. Other: No free fluid or abscess collection seen. No free intraperitoneal air. Musculoskeletal: No acute or suspicious osseous finding. IMPRESSION: 1. New bibasilar pleural effusions, at least moderate in size, incompletely imaged, with associated compressive atelectasis. 2. Bilateral hydronephrosis and hydroureter, moderate to severe in degree, not significantly changed compared to the previous study. Bladder is decompressed by Foley catheter. 3. Colonic diverticulosis without evidence of acute diverticulitis. 4. Infrarenal abdominal aortic aneurysm, measuring 5.2 cm, stable in the short-term interval. No periaortic hemorrhage or inflammation. Recommend followup by abdomen and pelvis CTA in 3-6 months, and vascular surgery referral/consultation if not already obtained. This recommendation follows ACR consensus guidelines: White Paper of the ACR Incidental Findings Committee II on Vascular Findings. J Am Coll Radiol 2013; 10:789-794. Electronically Signed   By: Franki Cabot M.D.   On: 12/25/2018 10:33   [place] Intraoperative  Result Date: 12/26/2018 CLINICAL DATA:  Ultrasound was provided for use by the ordering physician, and a technical charge was applied by the performing facility.  No radiologist interpretation/professional  services rendered.   US Renal  Result Date: 11/27/2018 CLINICAL DATA:  85 year old male with a history of acute renal failure EXAM: RENAL / URINARY TRACT ULTRASOUND COMPLETE COMPARISON:  CT 11/06/2018, ultrasound 11/06/2018 FINDINGS: Right Kidney: Length: 12.8 cm x  6.5 cm x 7.3 cm, 318 cc. Redemonstration of cortical thinning with hydronephrosis which was present on prior ultrasound and CT. Echogenicity of the right kidney cortex appears increased from the internal standard of the liver. Left Kidney: Length: 13.9 cm x 7.2 cm x 7.9 cm, 413 cc. Redemonstration of left-sided hydronephrosis. Echogenicity seems relatively symmetric to the right. Bladder: Urinary catheter in position within the bladder IMPRESSION: Ultrasound demonstrates chronic bilateral hydronephrosis, unchanged from comparison CT and ultrasound. Increased echogenicity of the bilateral renal cortex, likely reflecting element of medical renal disease. Electronically Signed   By: Corrie Mckusick D.O.   On: 11/27/2018 10:33   Dg Chest Port 1 View  Result Date: 12/25/2018 CLINICAL DATA:  Shortness of breath. EXAM: PORTABLE CHEST 1 VIEW COMPARISON:  11/05/2018. FINDINGS: Cardiac pacer noted with lead tip over the right atrium right ventricle. Heart size stable. Bibasilar infiltrates/edema and small bilateral pleural effusions. Bibasilar atelectasis. No pneumothorax. IMPRESSION: 1.  Cardiac pacer stable position. 2. Bibasilar infiltrates/edema and small bilateral pleural effusions. Findings have increased from prior exam. Electronically Signed   By: Hand   On: 12/25/2018 07:07   Ct Image Guided Drainage By Percutaneous Catheter  Result Date: 12/26/2018 INDICATION: 85 year old male with a history of bilateral ureteral obstruction. EXAM: CT AND ULTRASOUND GUIDED DRAINAGE OF LEFT KIDNEY MEDICATIONS: The patient is currently admitted to the hospital and receiving intravenous antibiotics. The antibiotics were administered within an appropriate time frame prior to the initiation of the procedure. ANESTHESIA/SEDATION: 1.0 mg IV Versed 50 mcg IV Fentanyl Moderate Sedation Time:  21 minutes The patient was continuously monitored during the procedure by the interventional radiology nurse under my direct  supervision. COMPLICATIONS: None TECHNIQUE: Informed written consent was obtained from the patient after a thorough discussion of the procedural risks, benefits and alternatives. All questions were addressed. Maximal Sterile Barrier Technique was utilized including caps, mask, sterile gowns, sterile gloves, sterile drape, hand hygiene and skin antiseptic. A timeout was performed prior to the initiation of the procedure. PROCEDURE: Informed written consent was obtained from the patient's family after a thorough discussion of the procedural risks, benefits and alternatives. All questions were addressed. Maximal Sterile Barrier Technique was utilized including caps, mask, sterile gowns, sterile gloves, sterile drape, hand hygiene and skin antiseptic. A timeout was performed prior to the initiation of the procedure. Patient positioned right decubitus position on the CT table. Scout CT was performed for planning purposes. Ultrasound survey of the left flank was performed with images stored and sent to PACs. The patient was then prepped and draped in the usual sterile fashion. 1% lidocaine was used to anesthetize the skin and subcutaneous tissues for local anesthesia. An 18 gauge trocar needle was then used to access a posterior inferior calyx with ultrasound guidance. With spontaneous urine returned through the needle, 035 wire was passed into the collecting system. CT was performed to confirm location. A small incision was made with an 11 blade scalpel, and the needle was removed from the wire. Ten French dilation was performed. Ten [place] drain was then advanced over the wire into the collecting system. Wire was removed.  Catheter pigtail was locked.  CT was performed. Catheter was sutured to the skin and attached to gravity drainage. Patient tolerated  the procedure well and remained hemodynamically stable throughout. No complications were encountered and no significant blood loss encountered FINDINGS: Scout CT in  right decubitus position demonstrates persisting left-sided hydronephrosis. Images during the case demonstrate placement of 10 French drain through inferior calyx into the collecting system. IMPRESSION: Status post CT-guided placement of left-sided percutaneous nephrostomy. Signed, Dulcy Fanny. Dellia Nims, RPVI [facility] Radiology PLAN: A single left-sided percutaneous access was performed given the need for positioning in the right decubitus position secondary to the patient's respiratory status. If need be, we may re-evaluate for possible right-sided percutaneous nephrostomy. Electronically Signed   By: Corrie Mckusick D.O.   On: 12/26/2018 11:30     CBC Recent Labs  Lab 12/25/18 0615 12/26/18 0255  WBC 8.5 9.2  HGB 8.3* 8.1*  HCT 27.8* 26.8*  PLT 128* 130*  MCV 88.3 89.0  MCH 26.3 26.9  MCHC 29.9* 30.2  RDW 16.6* 16.4*  LYMPHSABS 0.8  --   MONOABS 0.5  --   EOSABS 0.1  --   BASOSABS 0.0  --     Chemistries  Recent Labs  Lab 12/21/18 1429 12/25/18 0615 12/26/18 0255  NA 141 139 140  K 4.7 5.1 4.8  CL 102 102 105  CO2 20 23 20*  GLUCOSE 147* 151* 141*  BUN 68* 70* 75*  CREATININE 5.68* 5.21* 5.66*  CALCIUM 8.3* 8.2* 8.0*  AST  --  16  --   ALT  --  18  --   ALKPHOS  --  67  --   BILITOT  --  0.5  --    ------------------------------------------------------------------------------------------------------------------ estimated creatinine clearance is 9.2 mL/min (A) (by C-G formula based on SCr of 5.66 mg/dL (H)). ------------------------------------------------------------------------------------------------------------------ No results for input(s): HGBA1C in the last 72 hours. ------------------------------------------------------------------------------------------------------------------ No results for input(s): CHOL, HDL, LDLCALC, TRIG, CHOLHDL, LDLDIRECT in the last 72  hours. ------------------------------------------------------------------------------------------------------------------ No results for input(s): TSH, T4TOTAL, T3FREE, THYROIDAB in the last 72 hours.  Invalid input(s): FREET3 ------------------------------------------------------------------------------------------------------------------ No results for input(s): VITAMINB12, FOLATE, FERRITIN, TIBC, IRON, RETICCTPCT in the last 72 hours.  Coagulation profile Recent Labs  Lab 12/26/18 0255  INR 1.4*    No results for input(s): DDIMER in the last 72 hours.  Cardiac Enzymes Recent Labs  Lab 12/25/18 0615  TROPONINI 0.06*   ------------------------------------------------------------------------------------------------------------------ Invalid input(s): POCBNP    Assessment & Plan   MPRESSION AND PLAN: Patient is a 85 year old with chronic medical problems presents with shortness of breath cough  1.  Shortness of breath suspect due to acute diastolic CHF in setting of chronic bilateral hydronephrosis Continue Lasix No need to repeat echocardiogram due to recent echocardiogram being done Pneumonia ruled out  2.  Chronic kidney disease related to bilateral hydronephrosis in light of diuresis I will asked nephrology to see  3.  Bilateral hydronephrosis status post nephrostomy tube placement  4.  Diabetes type 2 continue sliding-scale insulin  5.  Hypertension continue metoprolol     Code Status Orders  (From admission, onward)         Start     Ordered   12/25/18 1234  Full code  Continuous     12/25/18 1233        Code Status History    Date Active Date Inactive Code Status Order ID Comments User Context   12/25/2018 0645 12/25/2018 1233 Full Code [id number]  Paulette Blanch, MD ED   11/21/2018 2217 11/29/2018 2052 Full Code [id number]  Seals, Theo Dills, NP  ED   11/05/2018 1755 11/09/2018 1728 Full Code [id number]  Sela Hua, MD Inpatient   09/10/2017 1430 09/13/2017  2115 Full Code [id number]  Idelle Crouch, MD Inpatient   06/10/2017 2304 06/12/2017 1908 Full Code [id number]  Lance Coon, MD Inpatient   05/17/2017 0829 05/19/2017 1525 Full Code [id number]  Hillary Bow, MD ED   03/31/2017 1334 04/03/2017 1611 Full Code [id number]  Nicholes Mango, MD Inpatient   10/21/2016 0155 10/25/2016 2145 Full Code [id number]  Hugelmeyer, [place], DO Inpatient   10/09/2016 2342 10/13/2016 2009 Full Code [id number]  Vaughan Basta, MD Inpatient   04/14/2015 1432 04/15/2015 1739 Full Code [id number]  Isaias Cowman, MD Inpatient   04/09/2015 0022 04/14/2015 1432 Full Code [id number]  Hower, Aaron Mose, MD ED   Advance Care Planning Activity    Advance Directive Documentation     Most Recent Value  Type of Advance Directive  Healthcare Power of Attorney  Pre-existing out of facility DNR order (yellow form or pink MOST form)  -  "MOST" Form in Place?  -           Consults  none  DVT Prophylaxis heparin  Lab Results  Component Value Date   PLT 130 (L) 12/26/2018     Time Spent in minutes   68min  Greater than 50% of time spent in care coordination and counseling patient regarding the condition and plan of care.   Dustin Flock M.D on 12/26/2018 at 2:59 PM  Between 7am to 6pm - Pager - [phone]  After 6pm go to [email or web address] - Proofreader  [employer]   Office  [phone]

## 2018-12-27 DIAGNOSIS — I509 Heart failure, unspecified: Secondary | ICD-10-CM

## 2018-12-27 DIAGNOSIS — Z96652 Presence of left artificial knee joint: Secondary | ICD-10-CM

## 2018-12-27 DIAGNOSIS — R0603 Acute respiratory distress: Secondary | ICD-10-CM

## 2018-12-27 DIAGNOSIS — Z888 Allergy status to other drugs, medicaments and biological substances status: Secondary | ICD-10-CM

## 2018-12-27 DIAGNOSIS — Z79899 Other long term (current) drug therapy: Secondary | ICD-10-CM

## 2018-12-27 DIAGNOSIS — Z950 Presence of cardiac pacemaker: Secondary | ICD-10-CM

## 2018-12-27 DIAGNOSIS — N184 Chronic kidney disease, stage 4 (severe): Secondary | ICD-10-CM

## 2018-12-27 DIAGNOSIS — C61 Malignant neoplasm of prostate: Secondary | ICD-10-CM

## 2018-12-27 DIAGNOSIS — Z87440 Personal history of urinary (tract) infections: Secondary | ICD-10-CM

## 2018-12-27 DIAGNOSIS — N1330 Unspecified hydronephrosis: Secondary | ICD-10-CM

## 2018-12-27 DIAGNOSIS — Z923 Personal history of irradiation: Secondary | ICD-10-CM

## 2018-12-27 DIAGNOSIS — Z87891 Personal history of nicotine dependence: Secondary | ICD-10-CM

## 2018-12-27 DIAGNOSIS — Z936 Other artificial openings of urinary tract status: Secondary | ICD-10-CM

## 2018-12-27 DIAGNOSIS — C7911 Secondary malignant neoplasm of bladder: Secondary | ICD-10-CM

## 2018-12-27 DIAGNOSIS — R7881 Bacteremia: Secondary | ICD-10-CM

## 2018-12-27 DIAGNOSIS — Z881 Allergy status to other antibiotic agents status: Secondary | ICD-10-CM

## 2018-12-27 DIAGNOSIS — N390 Urinary tract infection, site not specified: Secondary | ICD-10-CM

## 2018-12-27 DIAGNOSIS — N179 Acute kidney failure, unspecified: Secondary | ICD-10-CM

## 2018-12-27 DIAGNOSIS — B965 Pseudomonas (aeruginosa) (mallei) (pseudomallei) as the cause of diseases classified elsewhere: Secondary | ICD-10-CM

## 2018-12-27 DIAGNOSIS — Z960 Presence of urogenital implants: Secondary | ICD-10-CM

## 2018-12-27 LAB — GLUCOSE, CAPILLARY
Glucose-Capillary: 134 mg/dL — ABNORMAL HIGH (ref 70–99)
Glucose-Capillary: 189 mg/dL — ABNORMAL HIGH (ref 70–99)
Glucose-Capillary: 117 mg/dL — ABNORMAL HIGH (ref 70–99)
Glucose-Capillary: 128 mg/dL — ABNORMAL HIGH (ref 70–99)

## 2018-12-27 LAB — BASIC METABOLIC PANEL
Anion gap: 14 (ref 5–15)
BUN: 75 mg/dL — ABNORMAL HIGH (ref 8–23)
CO2: 24 mmol/L (ref 22–32)
Calcium: 8.4 mg/dL — ABNORMAL LOW (ref 8.9–10.3)
Chloride: 106 mmol/L (ref 98–111)
Creatinine, Ser: 5.39 mg/dL — ABNORMAL HIGH (ref 0.61–1.24)
GFR calc Af Amer: 10 mL/min — ABNORMAL LOW (ref >60)
GFR calc non Af Amer: 8 mL/min — ABNORMAL LOW (ref >60)
Glucose, Bld: 150 mg/dL — ABNORMAL HIGH (ref 70–99)
Potassium: 4 mmol/L (ref 3.5–5.1)
Sodium: 144 mmol/L (ref 135–145)

## 2018-12-27 LAB — CBC
HCT: 26.3 % — ABNORMAL LOW (ref 39.0–52.0)
Hemoglobin: 8 g/dL — ABNORMAL LOW (ref 13.0–17.0)
MCH: 26.4 pg (ref 26.0–34.0)
MCHC: 30.4 g/dL (ref 30.0–36.0)
MCV: 86.8 fL (ref 80.0–100.0)
Platelets: 131 10*3/uL — ABNORMAL LOW (ref 150–400)
RBC: 3.03 MIL/uL — ABNORMAL LOW (ref 4.22–5.81)
RDW: 16.3 % — ABNORMAL HIGH (ref 11.5–15.5)
WBC: 7 10*3/uL (ref 4.0–10.5)
nRBC: 0 % (ref 0.0–0.2)

## 2018-12-27 MED ORDER — METOPROLOL TARTRATE 50 MG PO TABS
50.0000 mg | ORAL_TABLET | Freq: Two times a day (BID) | ORAL | Status: DC
  Administered 2018-12-27 – 2019-01-01 (×12): 50 mg via ORAL
  Filled 2018-12-27 (×12): qty 1

## 2018-12-27 MED ORDER — METOPROLOL TARTRATE 25 MG PO TABS: 25.0000 mg | ORAL_TABLET | Freq: Two times a day (BID) | ORAL | Status: DC

## 2018-12-27 NOTE — Progress Notes (Signed)
Urology Consult Follow Up  Subjective: No complaints.  Interventional radiology placed a left PCN yesterday.  Due to concerns of his airway and a prone position required for bilateral PCN a right nephrostomy tube was not placed.  Foley in place draining clear yellow urine.  Left nephrostomy tube draining light pink urine.  Creatinine at 5.39.  Nephrology consult in place.  Afebrile.    Anti-infectives: Anti-infectives (From admission, onward)   Start     Dose/Rate Route Frequency Ordered Stop   12/27/18 1000  ceFEPIme (MAXIPIME) 1 g in sodium chloride 0.9 % 100 mL IVPB     1 g 200 mL/hr over 30 Minutes Intravenous Every 24 hours 12/26/18 0839     12/26/18 0900  cefTRIAXone (ROCEPHIN) 1 g in sodium chloride 0.9 % 100 mL IVPB  Status:  Discontinued     1 g 200 mL/hr over 30 Minutes Intravenous Every 24 hours 12/25/18 0910 12/26/18 0354   12/26/18 0900  azithromycin (ZITHROMAX) 500 mg in sodium chloride 0.9 % 250 mL IVPB  Status:  Discontinued     500 mg 250 mL/hr over 60 Minutes Intravenous Every 24 hours 12/25/18 0910 12/26/18 0354   12/26/18 0400  ceFEPIme (MAXIPIME) 2 g in sodium chloride 0.9 % 100 mL IVPB  Status:  Discontinued     2 g 200 mL/hr over 30 Minutes Intravenous Every 12 hours 12/26/18 0354 12/26/18 0839   12/25/18 0830  cefTRIAXone (ROCEPHIN) 1 g in sodium chloride 0.9 % 100 mL IVPB     1 g 200 mL/hr over 30 Minutes Intravenous  Once 12/25/18 0815 12/25/18 0936   12/25/18 0830  azithromycin (ZITHROMAX) 500 mg in sodium chloride 0.9 % 250 mL IVPB     500 mg 250 mL/hr over 60 Minutes Intravenous  Once 12/25/18 0815 12/25/18 1127      Current Facility-Administered Medications  Medication Dose Route Frequency Provider Last Rate Last Dose  . 0.9 %  sodium chloride infusion  250 mL Intravenous PRN Dustin Flock, MD      . 0.9 %  sodium chloride infusion   Intravenous Continuous Corrie Mckusick, DO   Stopped at 12/26/18 1023  . acetaminophen (TYLENOL) tablet 650 mg  650 mg  Oral Q6H PRN Dustin Flock, MD   650 mg at 12/26/18 2039   Or  . acetaminophen (TYLENOL) suppository 650 mg  650 mg Rectal Q6H PRN Dustin Flock, MD      . amLODipine (NORVASC) tablet 2.5 mg  2.5 mg Oral Daily Dustin Flock, MD   2.5 mg at 12/27/18 0837  . ceFEPIme (MAXIPIME) 1 g in sodium chloride 0.9 % 100 mL IVPB  1 g Intravenous Q24H Dustin Flock, MD 200 mL/hr at 12/27/18 0853 1 g at 12/27/18 0853  . finasteride (PROSCAR) tablet 5 mg  5 mg Oral Daily Dustin Flock, MD   5 mg at 12/27/18 0837  . furosemide (LASIX) injection 40 mg  40 mg Intravenous Q12H Dustin Flock, MD   40 mg at 12/27/18 0838  . heparin injection 5,000 Units  5,000 Units Subcutaneous Q8H Dustin Flock, MD   5,000 Units at 12/27/18 0529  . insulin aspart (novoLOG) injection 0-5 Units  0-5 Units Subcutaneous QHS Dustin Flock, MD      . insulin aspart (novoLOG) injection 0-9 Units  0-9 Units Subcutaneous TID WC Dustin Flock, MD   1 Units at 12/27/18 0836  . metoprolol tartrate (LOPRESSOR) tablet 50 mg  50 mg Oral BID Dustin Flock, MD   50 mg  at 12/27/18 0850  . nitroGLYCERIN (NITROGLYN) 2 % ointment 0.5 inch  0.5 inch Topical Q6H Dustin Flock, MD   0.5 inch at 12/27/18 0529  . ondansetron (ZOFRAN) tablet 4 mg  4 mg Oral Q6H PRN Dustin Flock, MD       Or  . ondansetron (ZOFRAN) injection 4 mg  4 mg Intravenous Q6H PRN Dustin Flock, MD      . pantoprazole (PROTONIX) EC tablet 40 mg  40 mg Oral Daily Dustin Flock, MD   40 mg at 12/27/18 0837  . rosuvastatin (CRESTOR) tablet 5 mg  5 mg Oral QHS Dustin Flock, MD   5 mg at 12/26/18 2107  . sodium bicarbonate tablet 650 mg  650 mg Oral BID Dustin Flock, MD   650 mg at 12/27/18 0837  . sodium chloride flush (NS) 0.9 % injection 3 mL  3 mL Intravenous Q12H Dustin Flock, MD   3 mL at 12/27/18 0838  . sodium chloride flush (NS) 0.9 % injection 3 mL  3 mL Intravenous PRN Dustin Flock, MD      . tamsulosin (FLOMAX) capsule 0.4 mg  0.4 mg Oral  Daily Dustin Flock, MD   0.4 mg at 12/26/18 1713     Objective: Vital signs in last 24 hours: Temp:  [98.2 F (36.8 C)-99 F (37.2 C)] 98.2 F (36.8 C) (06/17 0732) Pulse Rate:  [74-120] 77 (06/17 0732) Resp:  [15-36] 16 (06/17 0444) BP: (121-158)/(75-108) 144/96 (06/17 0732) SpO2:  [90 %-100 %] 99 % (06/17 0732)  Intake/Output from previous day: 06/16 0701 - 06/17 0700 In: 240 [P.O.:240] Out: 5875 [Urine:5875] Intake/Output this shift: No intake/output data recorded.   Physical Exam:  Constitutional:  Elderly, frail appearing.  Mild disorientation.   HEENT: [place] AT, moist mucus membranes.  Trachea midline, no masses. Cardiovascular: No clubbing, cyanosis, or edema. Respiratory: Normal respiratory effort, no increased work of breathing. GI: Abdomen is soft, non tender, non distended, no abdominal masses.  Patient refused to roll onto side so could not visualize nephrostomy tube dressing, but it felt dry.   GU: No CVA tenderness.  No bladder fullness or masses.  Patient with uncircumcised phallus.  Psychiatric: Normal mood and affect.  Slightly irritated.     Lab Results:  Recent Labs    12/26/18 0255 12/27/18 0520  WBC 9.2 7.0  HGB 8.1* 8.0*  HCT 26.8* 26.3*  PLT 130* 131*   BMET Recent Labs    12/26/18 0255 12/27/18 0520  NA 140 144  K 4.8 4.0  CL 105 106  CO2 20* 24  GLUCOSE 141* 150*  BUN 75* 75*  CREATININE 5.66* 5.39*  CALCIUM 8.0* 8.4*   PT/INR Recent Labs    12/26/18 0255  LABPROT 16.6*  INR 1.4*    Assessment: s/p left nephrostomy tube placement.  Stable  Plan: -Follow creatinine -Continue Foley catheter drainage for now    LOS: 2 days    [facility] 12/27/2018

## 2018-12-27 NOTE — Progress Notes (Signed)
MD was made aware of patient having runs of v-tach with wide QRS complex when been put back  to bed . Will continue to monitor

## 2018-12-27 NOTE — Progress Notes (Signed)
Physical Therapy Evaluation Patient Details Name: Blake [name] MRN: [id number] DOB: 22-May-1923 Today's Date: 12/27/2018   History of Present Illness  Blake [name]  is a 85 y.o. male with a known history of diabetes type 2, chronic kidney disease, triple AAA, hyperlipidemia, hypertension, prostate cancer who was recently hospitalized with similar presentation.  Who is presenting with shortness of breath , cough, and weakness.  Patient has bilateral hydronephrosis and has been followed by urology.  Plan was for him to have nephrostomy tubes placed.  However according to urology notes they were waiting so the family can be with him during procedure.  Patient states that his been having shortness of breath and cough for the past few days.  In the ER he was evaluated and noticed to have findings consistent with likely CHF. Pt is now admitted for SOB secondary to suspected acute CHF. Pt also with bilateral hydronephrosis and is now s/p L nephrostomy tube placement.    Clinical Impression  Pt admitted with above diagnosis. Pt currently with functional limitations due to the deficits listed below (see PT Problem List).  Pt is modified independent for bed mobility. He requires HOB elevated and use of bed rails. Pt moves slowly but performs without external assistance. He requires assist to come to standing due to LE weakness but also significant posterior lean with loss of balance. Once in standing he requires continual minA+1 due to posterior leaning. He is able to take small shuffling steps from bed to recliner. He demonstrates posterior loss of balance requiring continual minA+1 support from therapist to prevent him from falling. Pt unsafe to ambulate farther at this time. He is certainly appropriate for SNF placement however family prefers to take him home with [facility]. They are able to provide 24/7 support for him. This is a safe and reasonable discharge plan.     Follow Up Recommendations SNF;Other  (comment)(Pt is appropriate for SNF but family prefers [facility] PT.) They are able to provide 24/7 support for him. This is a safe and reasonable discharge plan for him to return home with [facility] PT.    Equipment Recommendations  Rolling walker with 5" wheels;Other (comment)(Pt has rollator but needs front-wheeled walker)    Recommendations for Other Services       Precautions / Restrictions Precautions Precautions: Fall Restrictions Weight Bearing Restrictions: No      Mobility  Bed Mobility Overal bed mobility: Modified Independent             General bed mobility comments: HOB eleved and use of bed rails. Pt moves slowly but performs without external assistance  Transfers Overall transfer level: Needs assistance Equipment used: Rolling walker (2 wheeled) Transfers: Sit to/from Stand Sit to Stand: Mod assist         General transfer comment: Pt requires assist to come to standing due to LE weakness but also significant posterior lean with loss of balance. Once in standing he requires continual minA+1 due to posterior leaning  Ambulation/Gait Ambulation/Gait assistance: Min assist Gait Distance (Feet): 3 Feet Assistive device: Rolling walker (2 wheeled)       General Gait Details: Pt able to take small shuffling steps from bed to recliner. He demonstrates posterior loss of balance requiring continual minA+1 support from therapist to prevent him from falling. Pt unsafe to ambulate farther at this time  Stairs            Wheelchair Mobility    Modified Rankin (Stroke Patients Only)  Balance Overall balance assessment: Needs assistance Sitting-balance support: No upper extremity supported Sitting balance-Leahy Scale: Good     Standing balance support: Bilateral upper extremity supported Standing balance-Leahy Scale: Poor Standing balance comment: Continual minA+1 to maintain balance                             Pertinent Vitals/Pain Pain  Assessment: No/denies pain    Home Living Family/patient expects to be discharged to:: Private residence Living Arrangements: Spouse/significant other;Children;Other (Comment)(Daughter who is a Pharmacist, community) Available Help at Discharge: Family;Available 24 hours/day;Personal care attendant Type of Home: House Home Access: Ramped entrance     Home Layout: Multi-level;Able to live on main level with bedroom/bathroom Home Equipment: Walker - 4 wheels;Cane - single point;Bedside commode;Shower seat - built in;Wheelchair - manual      Prior Function Level of Independence: Needs assistance   Gait / Transfers Assistance Needed: limited household ambulator with Rollator  ADL's / Homemaking Assistance Needed: reports independent with dressing, toiletting, bathing. Assist required for IADLs  Comments: Out of the house for doctor's appointments only     Hand Dominance   Dominant Hand: Right    Extremity/Trunk Assessment   Upper Extremity Assessment Upper Extremity Assessment: RUE deficits/detail RUE Deficits / Details: Appears to lack bilateral shoulder AROM above shoulder height but otherwise UEs are strong within available range of motion    Lower Extremity Assessment Lower Extremity Assessment: Generalized weakness       Communication   Communication: Other (comment)(Speech is somethwat mumbled without dentures)  Cognition Arousal/Alertness: Awake/alert Behavior During Therapy: WFL for tasks assessed/performed Overall Cognitive Status: Impaired/Different from baseline Area of Impairment: Orientation                 Orientation Level: Disoriented to;Time             General Comments: Pt is AOx2, disoriented to year and month but oriented to situation      General Comments      Exercises     Assessment/Plan    PT Assessment Patient needs continued PT services  PT Problem List Decreased strength;Decreased balance;Decreased mobility       PT Treatment  Interventions DME instruction;Gait training;Stair training;Functional mobility training;Therapeutic activities;Therapeutic exercise;Balance training;Neuromuscular re-education;Patient/family education    PT Goals (Current goals can be found in the Care Plan section)  Acute Rehab PT Goals Patient Stated Goal: Return to prior level of function PT Goal Formulation: With patient Time For Goal Achievement: 01/10/19 Potential to Achieve Goals: Fair    Frequency Min 2X/week   Barriers to discharge        Co-evaluation               AM-PAC PT "6 Clicks" Mobility  Outcome Measure Help needed turning from your back to your side while in a flat bed without using bedrails?: None Help needed moving from lying on your back to sitting on the side of a flat bed without using bedrails?: None Help needed moving to and from a bed to a chair (including a wheelchair)?: A Little Help needed standing up from a chair using your arms (e.g., wheelchair or bedside chair)?: A Little Help needed to walk in hospital room?: A Lot Help needed climbing 3-5 steps with a railing? : Total 6 Click Score: 17    End of Session Equipment Utilized During Treatment: Gait belt Activity Tolerance: Patient tolerated treatment well Patient left: in chair;with call bell/phone within reach;with  chair alarm set Nurse Communication: Other (comment)(Nursing observed mobility) PT Visit Diagnosis: Unsteadiness on feet (R26.81);Muscle weakness (generalized) (M62.81);Difficulty in walking, not elsewhere classified (R26.2)    Time: 6088-8358 PT Time Calculation (min) (ACUTE ONLY): 32 min   Charges:   PT Evaluation $PT Eval Low Complexity: 1 Low PT Treatments $Therapeutic Activity: 8-22 mins        Travaris Kosh D Laronica Bhagat PT, DPT, GCS   Ailine Hefferan 12/27/2018, 11:26 AM

## 2018-12-27 NOTE — Progress Notes (Signed)
Cardiac Rehab Navigator/ Exercise Physiologist Note  This EP rounded on patient to assess for education readiness and needs. Patient was sitting in chair comfortably. Patient reported being sick all over. This EP will call patient's daughter to provide Heart Failure education.   This EP spoke with daughter of patient to share educational materials about Living with Heart Failure. This EP asked daughter for a good time to call and have a conversation about where patient will go home and who will take care of him, so that we can provide this education to the right family member. Daughter is available for a call tomorrow afternoon after 4 pm. This EP will follow up at that time.   Blake [name], [facility]  Exercise Physiologist Department Phone #: [phone] Fax: [phone]  Direct Line [phone] Email Address: [email or web address]

## 2018-12-27 NOTE — Progress Notes (Signed)
[place] at [facility]                                                                                                                                                                                  Patient Demographics   Blake [name], is a 85 y.o. male, DOB - February 20, 1923, BLT:[id number]  Admit date - 12/25/2018   Admitting Physician Dustin Flock, MD  Outpatient Primary MD for the patient is [name], MD   LOS - 2  Subjective: Patient doing better he states that he is feeling much breathing improved  Review of Systems:    CONSTITUTIONAL: No documented fever. No fatigue, weakness. No weight gain, no weight loss.  EYES: No blurry or double vision.  ENT: No tinnitus. No postnasal drip. No redness of the oropharynx.  RESPIRATORY: No cough, no wheeze, no hemoptysis. No dyspnea.  CARDIOVASCULAR: No chest pain. No orthopnea. No palpitations. No syncope.  GASTROINTESTINAL: No nausea, no vomiting or diarrhea. No abdominal pain. No melena or hematochezia.  GENITOURINARY:  No urgency. No frequency. No dysuria. No hematuria. No obstructive symptoms. No discharge. No pain. No significant abnormal bleeding ENDOCRINE: No polyuria or nocturia. No heat or cold intolerance.  HEMATOLOGY: No anemia. No bruising. No bleeding. No purpura. No petechiae INTEGUMENTARY: No rashes. No lesions.  MUSCULOSKELETAL: No arthritis. No swelling. No gout.  NEUROLOGIC: No numbness, tingling, or ataxia. No seizure-type activity.  PSYCHIATRIC: No anxiety. No insomnia. No ADD.     Vitals:   Vitals:   12/26/18 2336 12/27/18 0056 12/27/18 0444 12/27/18 0732  BP: (!) 129/94 130/86 137/84 (!) 144/96  Pulse: 84 97 74 77  Resp:   16   Temp:   98.7 F (37.1 C) 98.2 F (36.8 C)  TempSrc:   Oral Oral  SpO2:  97% 97% 99%  Weight:      Height:        Wt Readings from Last 3 Encounters:  12/25/18 81.6 kg  11/21/18 99.2 kg  11/05/18 98.8 kg     Intake/Output Summary (Last 24  hours) at 12/27/2018 1234 Last data filed at 12/27/2018 1100 Gross per 24 hour  Intake 1090 ml  Output 4875 ml  Net -3785 ml    Physical Exam:   GENERAL: Pleasant-appearing in no apparent distress.   HEAD, EYES, EARS, NOSE AND THROAT: Atraumatic, normocephalic. Extraocular muscles are intact. Pupils equal and reactive to light. Sclerae anicteric. No conjunctival injection. No oro-pharyngeal erythema.  NECK: Supple. There is no jugular venous distention. No bruits, no lymphadenopathy, no thyromegaly.  HEART: Regular rate and rhythm,. No murmurs, no rubs, no clicks.  LUNGS: Clear to auscultation bilaterally. No rales  or rhonchi. No wheezes.  ABDOMEN: Soft, flat, nontender, nondistended. Has good bowel sounds. No hepatosplenomegaly appreciated.  EXTREMITIES: No evidence of any cyanosis, clubbing, or peripheral edema.  +2 pedal and radial pulses bilaterally.  NEUROLOGIC: Able to follow commands no focal deficits noted SKIN: Moist and warm with no rashes appreciated.  Psych: Not anxious, depressed LN: No inguinal LN enlargement    Antibiotics   Anti-infectives (From admission, onward)   Start     Dose/Rate Route Frequency Ordered Stop   12/27/18 1000  ceFEPIme (MAXIPIME) 1 g in sodium chloride 0.9 % 100 mL IVPB     1 g 200 mL/hr over 30 Minutes Intravenous Every 24 hours 12/26/18 0839     12/26/18 0900  cefTRIAXone (ROCEPHIN) 1 g in sodium chloride 0.9 % 100 mL IVPB  Status:  Discontinued     1 g 200 mL/hr over 30 Minutes Intravenous Every 24 hours 12/25/18 0910 12/26/18 0354   12/26/18 0900  azithromycin (ZITHROMAX) 500 mg in sodium chloride 0.9 % 250 mL IVPB  Status:  Discontinued     500 mg 250 mL/hr over 60 Minutes Intravenous Every 24 hours 12/25/18 0910 12/26/18 0354   12/26/18 0400  ceFEPIme (MAXIPIME) 2 g in sodium chloride 0.9 % 100 mL IVPB  Status:  Discontinued     2 g 200 mL/hr over 30 Minutes Intravenous Every 12 hours 12/26/18 0354 12/26/18 0839   12/25/18 0830   cefTRIAXone (ROCEPHIN) 1 g in sodium chloride 0.9 % 100 mL IVPB     1 g 200 mL/hr over 30 Minutes Intravenous  Once 12/25/18 0815 12/25/18 0936   12/25/18 0830  azithromycin (ZITHROMAX) 500 mg in sodium chloride 0.9 % 250 mL IVPB     500 mg 250 mL/hr over 60 Minutes Intravenous  Once 12/25/18 0815 12/25/18 1127      Medications   Scheduled Meds: . amLODipine  2.5 mg Oral Daily  . finasteride  5 mg Oral Daily  . furosemide  40 mg Intravenous Q12H  . heparin injection (subcutaneous)  5,000 Units Subcutaneous Q8H  . insulin aspart  0-5 Units Subcutaneous QHS  . insulin aspart  0-9 Units Subcutaneous TID WC  . metoprolol tartrate  50 mg Oral BID  . nitroGLYCERIN  0.5 inch Topical Q6H  . pantoprazole  40 mg Oral Daily  . rosuvastatin  5 mg Oral QHS  . sodium bicarbonate  650 mg Oral BID  . sodium chloride flush  3 mL Intravenous Q12H  . tamsulosin  0.4 mg Oral Daily   Continuous Infusions: . sodium chloride    . sodium chloride Stopped (12/26/18 1023)  . ceFEPime (MAXIPIME) IV 1 g (12/27/18 0853)   PRN Meds:.sodium chloride, acetaminophen **OR** acetaminophen, ondansetron **OR** ondansetron (ZOFRAN) IV, sodium chloride flush   Data Review:   Micro Results Recent Results (from the past 240 hour(s))  Culture, blood (routine x 2)     Status: None (Preliminary result)   Collection Time: 12/25/18  6:15 AM   Specimen: BLOOD  Result Value Ref Range Status   Specimen Description BLOOD LEFT ANTECUBITAL  Final   Special Requests   Final    BOTTLES DRAWN AEROBIC AND ANAEROBIC Blood Culture adequate volume   Culture   Final    NO GROWTH 2 DAYS Performed at [facility], [address] [place],  [address]    Report Status PENDING  Incomplete  Culture, blood (routine x 2)     Status: Abnormal (Preliminary result)   Collection  Time: 12/25/18  6:15 AM   Specimen: BLOOD  Result Value Ref Range Status   Specimen Description   Final    BLOOD RIGHT FOREARM Performed  at [facility], [address] [place], [place] [address]    Special Requests   Final    BOTTLES DRAWN AEROBIC AND ANAEROBIC Blood Culture results may not be optimal due to an excessive volume of blood received in culture bottles Performed at [facility], [address], [place], [place] [address]    Culture  Setup Time   Final    GRAM NEGATIVE RODS AEROBIC BOTTLE ONLY CRITICAL RESULT CALLED TO, READ BACK BY AND VERIFIED WITH:  DAVID BESANTI PHARMD 0300 12/26/2018 HNM Performed at [facility], [place] [address], [place], [place] [address]    Culture PSEUDOMONAS AERUGINOSA (A)  Final   Report Status PENDING  Incomplete  Urine culture     Status: Abnormal (Preliminary result)   Collection Time: 12/25/18  6:15 AM   Specimen: Urine, Random  Result Value Ref Range Status   Specimen Description   Final    URINE, RANDOM Performed at [facility], [address] [place],  [address]    Special Requests   Final    NONE Performed at [facility], [address] [place],  [address]    Culture >=100,000 COLONIES/mL PSEUDOMONAS AERUGINOSA (A)  Final   Report Status PENDING  Incomplete  SARS Coronavirus 2 (CEPHEID- Performed in [place] hospital lab), Hosp Order     Status: None   Collection Time: 12/25/18  6:15 AM   Specimen: Nasopharyngeal  Result Value Ref Range Status   SARS Coronavirus 2 NEGATIVE NEGATIVE Final    Comment: (NOTE) If result is NEGATIVE SARS-CoV-2 target nucleic acids are NOT DETECTED. The SARS-CoV-2 RNA is generally detectable in upper and lower  respiratory specimens during the acute phase of infection. The lowest  concentration of SARS-CoV-2 viral copies this assay can detect is 250  copies / mL. A negative result does not preclude SARS-CoV-2 infection  and should not be used as the sole basis for treatment or other  patient management decisions.  A negative result may occur with  improper specimen  collection / handling, submission of specimen other  than nasopharyngeal swab, presence of viral mutation(s) within the  areas targeted by this assay, and inadequate number of viral copies  (<250 copies / mL). A negative result must be combined with clinical  observations, patient history, and epidemiological information. If result is POSITIVE SARS-CoV-2 target nucleic acids are DETECTED. The SARS-CoV-2 RNA is generally detectable in upper and lower  respiratory specimens dur ing the acute phase of infection.  Positive  results are indicative of active infection with SARS-CoV-2.  Clinical  correlation with patient history and other diagnostic information is  necessary to determine patient infection status.  Positive results do  not rule out bacterial infection or co-infection with other viruses. If result is PRESUMPTIVE POSTIVE SARS-CoV-2 nucleic acids MAY BE PRESENT.   A presumptive positive result was obtained on the submitted specimen  and confirmed on repeat testing.  While 2019 novel coronavirus  (SARS-CoV-2) nucleic acids may be present in the submitted sample  additional confirmatory testing may be necessary for epidemiological  and / or clinical management purposes  to differentiate between  SARS-CoV-2 and other Sarbecovirus currently known to infect humans.  If clinically indicated additional testing with an alternate test  methodology [phone]) is advised. The SARS-CoV-2 RNA  is generally  detectable in upper and lower respiratory sp ecimens during the acute  phase of infection. The expected result is Negative. Fact Sheet for Patients:  StrictlyIdeas.no Fact Sheet for Healthcare Providers: BankingDealers.co.za This test is not yet approved or cleared by the [place] FDA and has been authorized for detection and/or diagnosis of SARS-CoV-2 by FDA under an Emergency Use Authorization (EUA).  This EUA will remain in effect  (meaning this test can be used) for the duration of the COVID-19 declaration under Section 564(b)(1) of the Act, 21 U.S.C. section 360bbb-3(b)(1), unless the authorization is terminated or revoked sooner. Performed at [facility], [address] [place], [place] [address]   Blood Culture ID Panel (Reflexed)     Status: Abnormal   Collection Time: 12/25/18  6:15 AM  Result Value Ref Range Status   Enterococcus species NOT DETECTED NOT DETECTED Final   Listeria monocytogenes NOT DETECTED NOT DETECTED Final   Staphylococcus species NOT DETECTED NOT DETECTED Final   Staphylococcus aureus (BCID) NOT DETECTED NOT DETECTED Final   Streptococcus species NOT DETECTED NOT DETECTED Final   Streptococcus agalactiae NOT DETECTED NOT DETECTED Final   Streptococcus pneumoniae NOT DETECTED NOT DETECTED Final   Streptococcus pyogenes NOT DETECTED NOT DETECTED Final   Acinetobacter baumannii NOT DETECTED NOT DETECTED Final   Enterobacteriaceae species NOT DETECTED NOT DETECTED Final   Enterobacter cloacae complex NOT DETECTED NOT DETECTED Final   Escherichia coli NOT DETECTED NOT DETECTED Final   Klebsiella oxytoca NOT DETECTED NOT DETECTED Final   Klebsiella pneumoniae NOT DETECTED NOT DETECTED Final   Proteus species NOT DETECTED NOT DETECTED Final   Serratia marcescens NOT DETECTED NOT DETECTED Final   Carbapenem resistance NOT DETECTED NOT DETECTED Final   Haemophilus influenzae NOT DETECTED NOT DETECTED Final   Neisseria meningitidis NOT DETECTED NOT DETECTED Final   Pseudomonas aeruginosa DETECTED (A) NOT DETECTED Final    Comment: CRITICAL RESULT CALLED TO, READ BACK BY AND VERIFIED WITH: DAVID BESANTI PHARMD 0300 12/26/2018 HNM    Candida albicans NOT DETECTED NOT DETECTED Final   Candida glabrata NOT DETECTED NOT DETECTED Final   Candida krusei NOT DETECTED NOT DETECTED Final   Candida parapsilosis NOT DETECTED NOT DETECTED Final   Candida tropicalis NOT DETECTED NOT DETECTED  Final    Comment: Performed at [facility], [address] [place], [place] [address]    Radiology Reports Ct Abdomen Pelvis Wo Contrast  Result Date: 12/25/2018 CLINICAL DATA:  Shortness of breath and cough, weakness. Bilateral hydronephrosis EXAM: CT ABDOMEN AND PELVIS WITHOUT CONTRAST TECHNIQUE: Multidetector CT imaging of the abdomen and pelvis was performed following the standard protocol without IV contrast. COMPARISON:  CT abdomen dated 11/06/2018. Chest x-ray from earlier same day. FINDINGS: Lower chest: Bibasilar pleural effusions, at least moderate in size, incompletely imaged with associated compressive atelectasis. Hepatobiliary: Stable small hypodensity within the RIGHT liver lobe, too small to definitively characterize but most likely a cyst. No new liver findings. Gallbladder appears normal. No bile duct dilatation seen. Pancreas: Unremarkable. No pancreatic ductal dilatation or surrounding inflammatory changes. Spleen: Normal in size without focal abnormality. Adrenals/Urinary Tract: Bilateral hydronephrosis, moderate to severe in degree, not significantly changed compared to the previous study. Stable bilateral hydroureter. Bladder is decompressed by Foley catheter. Stomach/Bowel: No dilated large or small bowel loops. Stomach is unremarkable, partially decompressed. Moderate amount of stool in the rectal vault with commensurate distension. Diffuse colonic diverticulosis but no focal inflammatory change to suggest acute diverticulitis. Appendix is  not seen but there are no inflammatory changes in the RIGHT lower quadrant to suggest acute appendicitis. Vascular/Lymphatic: Infrarenal abdominal aortic aneurysm, measuring 5.2 x 5 cm, stable in the short-term interval. Extensive atherosclerosis of the abdominal aorta and branch vessels. Extensive atherosclerosis of the pelvic vasculature. No acute findings. No enlarged lymph nodes seen. Reproductive: Brachytherapy seeds in place.  Other: No free fluid or abscess collection seen. No free intraperitoneal air. Musculoskeletal: No acute or suspicious osseous finding. IMPRESSION: 1. New bibasilar pleural effusions, at least moderate in size, incompletely imaged, with associated compressive atelectasis. 2. Bilateral hydronephrosis and hydroureter, moderate to severe in degree, not significantly changed compared to the previous study. Bladder is decompressed by Foley catheter. 3. Colonic diverticulosis without evidence of acute diverticulitis. 4. Infrarenal abdominal aortic aneurysm, measuring 5.2 cm, stable in the short-term interval. No periaortic hemorrhage or inflammation. Recommend followup by abdomen and pelvis CTA in 3-6 months, and vascular surgery referral/consultation if not already obtained. This recommendation follows ACR consensus guidelines: White Paper of the ACR Incidental Findings Committee II on Vascular Findings. J Am Coll Radiol 2013; 10:789-794. Electronically Signed   By: Franki Cabot M.D.   On: 12/25/2018 10:33   [place] Intraoperative  Result Date: 12/26/2018 CLINICAL DATA:  Ultrasound was provided for use by the ordering physician, and a technical charge was applied by the performing facility.  No radiologist interpretation/professional services rendered.   Dg Chest Port 1 View  Result Date: 12/25/2018 CLINICAL DATA:  Shortness of breath. EXAM: PORTABLE CHEST 1 VIEW COMPARISON:  11/05/2018. FINDINGS: Cardiac pacer noted with lead tip over the right atrium right ventricle. Heart size stable. Bibasilar infiltrates/edema and small bilateral pleural effusions. Bibasilar atelectasis. No pneumothorax. IMPRESSION: 1.  Cardiac pacer stable position. 2. Bibasilar infiltrates/edema and small bilateral pleural effusions. Findings have increased from prior exam. Electronically Signed   By: Frost   On: 12/25/2018 07:07   Ct Image Guided Drainage By Percutaneous Catheter  Result Date: 12/26/2018 INDICATION: 85 year old  male with a history of bilateral ureteral obstruction. EXAM: CT AND ULTRASOUND GUIDED DRAINAGE OF LEFT KIDNEY MEDICATIONS: The patient is currently admitted to the hospital and receiving intravenous antibiotics. The antibiotics were administered within an appropriate time frame prior to the initiation of the procedure. ANESTHESIA/SEDATION: 1.0 mg IV Versed 50 mcg IV Fentanyl Moderate Sedation Time:  21 minutes The patient was continuously monitored during the procedure by the interventional radiology nurse under my direct supervision. COMPLICATIONS: None TECHNIQUE: Informed written consent was obtained from the patient after a thorough discussion of the procedural risks, benefits and alternatives. All questions were addressed. Maximal Sterile Barrier Technique was utilized including caps, mask, sterile gowns, sterile gloves, sterile drape, hand hygiene and skin antiseptic. A timeout was performed prior to the initiation of the procedure. PROCEDURE: Informed written consent was obtained from the patient's family after a thorough discussion of the procedural risks, benefits and alternatives. All questions were addressed. Maximal Sterile Barrier Technique was utilized including caps, mask, sterile gowns, sterile gloves, sterile drape, hand hygiene and skin antiseptic. A timeout was performed prior to the initiation of the procedure. Patient positioned right decubitus position on the CT table. Scout CT was performed for planning purposes. Ultrasound survey of the left flank was performed with images stored and sent to PACs. The patient was then prepped and draped in the usual sterile fashion. 1% lidocaine was used to anesthetize the skin and subcutaneous tissues for local anesthesia. An 18 gauge trocar needle was then used to access a  posterior inferior calyx with ultrasound guidance. With spontaneous urine returned through the needle, 035 wire was passed into the collecting system. CT was performed to confirm location.  A small incision was made with an 11 blade scalpel, and the needle was removed from the wire. Ten French dilation was performed. Ten [place] drain was then advanced over the wire into the collecting system. Wire was removed.  Catheter pigtail was locked.  CT was performed. Catheter was sutured to the skin and attached to gravity drainage. Patient tolerated the procedure well and remained hemodynamically stable throughout. No complications were encountered and no significant blood loss encountered FINDINGS: Scout CT in right decubitus position demonstrates persisting left-sided hydronephrosis. Images during the case demonstrate placement of 10 French drain through inferior calyx into the collecting system. IMPRESSION: Status post CT-guided placement of left-sided percutaneous nephrostomy. Signed, Dulcy Fanny. Dellia Nims, RPVI [facility] Radiology PLAN: A single left-sided percutaneous access was performed given the need for positioning in the right decubitus position secondary to the patient's respiratory status. If need be, we may re-evaluate for possible right-sided percutaneous nephrostomy. Electronically Signed   By: Corrie Mckusick D.O.   On: 12/26/2018 11:30     CBC Recent Labs  Lab 12/25/18 0615 12/26/18 0255 12/27/18 0520  WBC 8.5 9.2 7.0  HGB 8.3* 8.1* 8.0*  HCT 27.8* 26.8* 26.3*  PLT 128* 130* 131*  MCV 88.3 89.0 86.8  MCH 26.3 26.9 26.4  MCHC 29.9* 30.2 30.4  RDW 16.6* 16.4* 16.3*  LYMPHSABS 0.8  --   --   MONOABS 0.5  --   --   EOSABS 0.1  --   --   BASOSABS 0.0  --   --     Chemistries  Recent Labs  Lab 12/21/18 1429 12/25/18 0615 12/26/18 0255 12/27/18 0520  NA 141 139 140 144  K 4.7 5.1 4.8 4.0  CL 102 102 105 106  CO2 20 23 20* 24  GLUCOSE 147* 151* 141* 150*  BUN 68* 70* 75* 75*  CREATININE 5.68* 5.21* 5.66* 5.39*  CALCIUM 8.3* 8.2* 8.0* 8.4*  AST  --  16  --   --   ALT  --  18  --   --   ALKPHOS  --  67  --   --    BILITOT  --  0.5  --   --    ------------------------------------------------------------------------------------------------------------------ estimated creatinine clearance is 9.7 mL/min (A) (by C-G formula based on SCr of 5.39 mg/dL (H)). ------------------------------------------------------------------------------------------------------------------ No results for input(s): HGBA1C in the last 72 hours. ------------------------------------------------------------------------------------------------------------------ No results for input(s): CHOL, HDL, LDLCALC, TRIG, CHOLHDL, LDLDIRECT in the last 72 hours. ------------------------------------------------------------------------------------------------------------------ No results for input(s): TSH, T4TOTAL, T3FREE, THYROIDAB in the last 72 hours.  Invalid input(s): FREET3 ------------------------------------------------------------------------------------------------------------------ No results for input(s): VITAMINB12, FOLATE, FERRITIN, TIBC, IRON, RETICCTPCT in the last 72 hours.  Coagulation profile Recent Labs  Lab 12/26/18 0255  INR 1.4*    No results for input(s): DDIMER in the last 72 hours.  Cardiac Enzymes Recent Labs  Lab 12/25/18 0615  TROPONINI 0.06*   ------------------------------------------------------------------------------------------------------------------ Invalid input(s): POCBNP    Assessment & Plan   MPRESSION AND PLAN: Patient is a 85 year old with chronic medical problems presents with shortness of breath cough  1.  Shortness of breath suspect due to acute diastolic CHF in setting of chronic bilateral hydronephrosis Continue Lasix patient responding well to diuresis No need to repeat echocardiogram due to recent echocardiogram being done Pneumonia ruled out  2.  Sepsis due to Pseudomonas due to UTI I will have ID physician see the patient  3.Chronic kidney disease stage IV related to  bilateral hydronephrosis  Stable appreciate nephrology input  4.  Bilateral hydronephrosis status post left nephrostomy tube placement  5.  Diabetes type 2 continue sliding-scale insulin  6.  Hypertension continue metoprolol  7.  Anemia due to anemia of chronic disease  8.  Abdominal aortic aneurysm stable compared to before  9.  Miscellaneous Heparin for DVT prophylaxis     Code Status Orders  (From admission, onward)         Start     Ordered   12/25/18 1234  Full code  Continuous     12/25/18 1233        Code Status History    Date Active Date Inactive Code Status Order ID Comments User Context   12/25/2018 0645 12/25/2018 1233 Full Code [id number]  Paulette Blanch, MD ED   11/21/2018 2217 11/29/2018 2052 Full Code [id number]  Mayer Camel, NP ED   11/05/2018 1755 11/09/2018 1728 Full Code [id number]  Mayo, Pete Pelt, MD Inpatient   09/10/2017 1430 09/13/2017 2115 Full Code [id number]  Idelle Crouch, MD Inpatient   06/10/2017 2304 06/12/2017 1908 Full Code [id number]  Lance Coon, MD Inpatient   05/17/2017 0829 05/19/2017 1525 Full Code [id number]  Hillary Bow, MD ED   03/31/2017 1334 04/03/2017 1611 Full Code [id number]  Nicholes Mango, MD Inpatient   10/21/2016 0155 10/25/2016 2145 Full Code [id number]  Hugelmeyer, [place], DO Inpatient   10/09/2016 2342 10/13/2016 2009 Full Code [id number]  Vaughan Basta, MD Inpatient   04/14/2015 1432 04/15/2015 1739 Full Code [id number]  Isaias Cowman, MD Inpatient   04/09/2015 0022 04/14/2015 1432 Full Code [id number]  Hower, Aaron Mose, MD ED   Advance Care Planning Activity    Advance Directive Documentation     Most Recent Value  Type of Advance Directive  Healthcare Power of Attorney  Pre-existing out of facility DNR order (yellow form or pink MOST form)  -  "MOST" Form in Place?  -           Consults  none  DVT Prophylaxis heparin  Lab Results  Component Value Date   PLT 131 (L) 12/27/2018     Time Spent in minutes    74min  Greater than 50% of time spent in care coordination and counseling patient regarding the condition and plan of care.   Dustin Flock M.D on 12/27/2018 at 12:34 PM  Between 7am to 6pm - Pager - [phone]  After 6pm go to [email or web address] - Proofreader  [employer]   Office  [phone]

## 2018-12-27 NOTE — Progress Notes (Signed)
[facility]  ROUNDING NOTE   Subjective:   UOP total 5875 Creatinine 5.39 (5.66)  Blood cultures with pseudomonas  Objective:  Vital signs in last 24 hours:  Temp:  [98.2 F (36.8 C)-99 F (37.2 C)] 98.2 F (36.8 C) (06/17 0732) Pulse Rate:  [74-120] 77 (06/17 0732) Resp:  [16-18] 16 (06/17 0444) BP: (124-151)/(75-108) 144/96 (06/17 0732) SpO2:  [95 %-100 %] 99 % (06/17 0732)  Weight change:  Filed Weights   12/25/18 0607  Weight: 81.6 kg    Intake/Output: I/O last 3 completed shifts: In: 240 [P.O.:240] Out: 1194 [Urine:6475]   Intake/Output this shift:  Total I/O In: 250 [Other:250] Out: -   Physical Exam: General: NAD,   Head: Normocephalic, atraumatic. Moist oral mucosal membranes  Eyes: Anicteric, PERRL  Neck: Supple, trachea midline  Lungs:  Clear to auscultation  Heart: Regular rate and rhythm  Abdomen:  Soft, nontender,   Extremities:  trace  peripheral edema.  Neurologic: Nonfocal, moving all four extremities  Skin: No lesions   GU Left percutaneous nephrostomy tube    Basic Metabolic Panel: Recent Labs  Lab 12/21/18 1429 12/25/18 0615 12/26/18 0255 12/27/18 0520  NA 141 139 140 144  K 4.7 5.1 4.8 4.0  CL 102 102 105 106  CO2 20 23 20* 24  GLUCOSE 147* 151* 141* 150*  BUN 68* 70* 75* 75*  CREATININE 5.68* 5.21* 5.66* 5.39*  CALCIUM 8.3* 8.2* 8.0* 8.4*    Liver Function Tests: Recent Labs  Lab 12/25/18 0615  AST 16  ALT 18  ALKPHOS 67  BILITOT 0.5  PROT 7.8  ALBUMIN 3.0*   No results for input(s): LIPASE, AMYLASE in the last 168 hours. No results for input(s): AMMONIA in the last 168 hours.  CBC: Recent Labs  Lab 12/25/18 0615 12/26/18 0255 12/27/18 0520  WBC 8.5 9.2 7.0  NEUTROABS 6.9  --   --   HGB 8.3* 8.1* 8.0*  HCT 27.8* 26.8* 26.3*  MCV 88.3 89.0 86.8  PLT 128* 130* 131*    Cardiac Enzymes: Recent Labs  Lab 12/25/18 0615  TROPONINI 0.06*    BNP: Invalid input(s): POCBNP  CBG: Recent  Labs  Lab 12/26/18 0757 12/26/18 1149 12/26/18 1703 12/26/18 2042 12/27/18 0733  GLUCAP 129* 124* 142* 154* 134*    Microbiology: Results for orders placed or performed during the hospital encounter of 12/25/18  Culture, blood (routine x 2)     Status: None (Preliminary result)   Collection Time: 12/25/18  6:15 AM   Specimen: BLOOD  Result Value Ref Range Status   Specimen Description BLOOD LEFT ANTECUBITAL  Final   Special Requests   Final    BOTTLES DRAWN AEROBIC AND ANAEROBIC Blood Culture adequate volume   Culture   Final    NO GROWTH 2 DAYS Performed at [facility], [address], [place], [place] [address]    Report Status PENDING  Incomplete  Culture, blood (routine x 2)     Status: Abnormal (Preliminary result)   Collection Time: 12/25/18  6:15 AM   Specimen: BLOOD  Result Value Ref Range Status   Specimen Description   Final    BLOOD RIGHT FOREARM Performed at [facility], [address], [place], [place] [address]    Special Requests   Final    BOTTLES DRAWN AEROBIC AND ANAEROBIC Blood Culture results may not be optimal due to an excessive volume of blood received in culture bottles Performed at [facility], [address] [place], [place] [address]    Culture  Setup Time   Final    GRAM NEGATIVE RODS AEROBIC BOTTLE ONLY CRITICAL RESULT CALLED TO, READ BACK BY AND VERIFIED WITH:  DAVID BESANTI PHARMD 0300 12/26/2018 HNM Performed at [facility], [place] [address] [place], [place] [address]    Culture PSEUDOMONAS AERUGINOSA (A)  Final   Report Status PENDING  Incomplete  Urine culture     Status: Abnormal (Preliminary result)   Collection Time: 12/25/18  6:15 AM   Specimen: Urine, Random  Result Value Ref Range Status   Specimen Description   Final    URINE, RANDOM Performed at [facility], [address] [place], [place] [address]    Special Requests   Final    NONE Performed at [facility],  [address] [place], [place] [address]    Culture >=100,000 COLONIES/mL PSEUDOMONAS AERUGINOSA (A)  Final   Report Status PENDING  Incomplete  SARS Coronavirus 2 (CEPHEID- Performed in [place] hospital lab), Hosp Order     Status: None   Collection Time: 12/25/18  6:15 AM   Specimen: Nasopharyngeal  Result Value Ref Range Status   SARS Coronavirus 2 NEGATIVE NEGATIVE Final    Comment: (NOTE) If result is NEGATIVE SARS-CoV-2 target nucleic acids are NOT DETECTED. The SARS-CoV-2 RNA is generally detectable in upper and lower  respiratory specimens during the acute phase of infection. The lowest  concentration of SARS-CoV-2 viral copies this assay can detect is 250  copies / mL. A negative result does not preclude SARS-CoV-2 infection  and should not be used as the sole basis for treatment or other  patient management decisions.  A negative result may occur with  improper specimen collection / handling, submission of specimen other  than nasopharyngeal swab, presence of viral mutation(s) within the  areas targeted by this assay, and inadequate number of viral copies  (<250 copies / mL). A negative result must be combined with clinical  observations, patient history, and epidemiological information. If result is POSITIVE SARS-CoV-2 target nucleic acids are DETECTED. The SARS-CoV-2 RNA is generally detectable in upper and lower  respiratory specimens dur ing the acute phase of infection.  Positive  results are indicative of active infection with SARS-CoV-2.  Clinical  correlation with patient history and other diagnostic information is  necessary to determine patient infection status.  Positive results do  not rule out bacterial infection or co-infection with other viruses. If result is PRESUMPTIVE POSTIVE SARS-CoV-2 nucleic acids MAY BE PRESENT.   A presumptive positive result was obtained on the submitted specimen  and confirmed on repeat testing.  While 2019 novel  coronavirus  (SARS-CoV-2) nucleic acids may be present in the submitted sample  additional confirmatory testing may be necessary for epidemiological  and / or clinical management purposes  to differentiate between  SARS-CoV-2 and other Sarbecovirus currently known to infect humans.  If clinically indicated additional testing with an alternate test  methodology [phone]) is advised. The SARS-CoV-2 RNA is generally  detectable in upper and lower respiratory sp ecimens during the acute  phase of infection. The expected result is Negative. Fact Sheet for Patients:  StrictlyIdeas.no Fact Sheet for Healthcare Providers: BankingDealers.co.za This test is not yet approved or cleared by the [place] FDA and has been authorized for detection and/or diagnosis of SARS-CoV-2 by FDA under an Emergency Use Authorization (EUA).  This EUA will remain in effect (meaning this test can be used) for the  duration of the COVID-19 declaration under Section 564(b)(1) of the Act, 21 U.S.C. section 360bbb-3(b)(1), unless the authorization is terminated or revoked sooner. Performed at [facility], [address] [place], [place] [address]   Blood Culture ID Panel (Reflexed)     Status: Abnormal   Collection Time: 12/25/18  6:15 AM  Result Value Ref Range Status   Enterococcus species NOT DETECTED NOT DETECTED Final   Listeria monocytogenes NOT DETECTED NOT DETECTED Final   Staphylococcus species NOT DETECTED NOT DETECTED Final   Staphylococcus aureus (BCID) NOT DETECTED NOT DETECTED Final   Streptococcus species NOT DETECTED NOT DETECTED Final   Streptococcus agalactiae NOT DETECTED NOT DETECTED Final   Streptococcus pneumoniae NOT DETECTED NOT DETECTED Final   Streptococcus pyogenes NOT DETECTED NOT DETECTED Final   Acinetobacter baumannii NOT DETECTED NOT DETECTED Final   Enterobacteriaceae species NOT DETECTED NOT DETECTED Final    Enterobacter cloacae complex NOT DETECTED NOT DETECTED Final   Escherichia coli NOT DETECTED NOT DETECTED Final   Klebsiella oxytoca NOT DETECTED NOT DETECTED Final   Klebsiella pneumoniae NOT DETECTED NOT DETECTED Final   Proteus species NOT DETECTED NOT DETECTED Final   Serratia marcescens NOT DETECTED NOT DETECTED Final   Carbapenem resistance NOT DETECTED NOT DETECTED Final   Haemophilus influenzae NOT DETECTED NOT DETECTED Final   Neisseria meningitidis NOT DETECTED NOT DETECTED Final   Pseudomonas aeruginosa DETECTED (A) NOT DETECTED Final    Comment: CRITICAL RESULT CALLED TO, READ BACK BY AND VERIFIED WITH: DAVID BESANTI PHARMD 0300 12/26/2018 HNM    Candida albicans NOT DETECTED NOT DETECTED Final   Candida glabrata NOT DETECTED NOT DETECTED Final   Candida krusei NOT DETECTED NOT DETECTED Final   Candida parapsilosis NOT DETECTED NOT DETECTED Final   Candida tropicalis NOT DETECTED NOT DETECTED Final    Comment: Performed at [facility], [address] [place], [place] [address]    Coagulation Studies: Recent Labs    12/26/18 0255  LABPROT 16.6*  INR 1.4*    Urinalysis: Recent Labs    12/25/18 0615  COLORURINE YELLOW*  LABSPEC 1.010  PHURINE 6.0  GLUCOSEU NEGATIVE  HGBUR SMALL*  BILIRUBINUR NEGATIVE  KETONESUR NEGATIVE  PROTEINUR 30*  NITRITE NEGATIVE  LEUKOCYTESUR LARGE*      Imaging: [place] Intraoperative  Result Date: 12/26/2018 CLINICAL DATA:  Ultrasound was provided for use by the ordering physician, and a technical charge was applied by the performing facility.  No radiologist interpretation/professional services rendered.   Ct Image Guided Drainage By Percutaneous Catheter  Result Date: 12/26/2018 INDICATION: 85 year old male with a history of bilateral ureteral obstruction. EXAM: CT AND ULTRASOUND GUIDED DRAINAGE OF LEFT KIDNEY MEDICATIONS: The patient is currently admitted to the hospital and receiving intravenous antibiotics. The  antibiotics were administered within an appropriate time frame prior to the initiation of the procedure. ANESTHESIA/SEDATION: 1.0 mg IV Versed 50 mcg IV Fentanyl Moderate Sedation Time:  21 minutes The patient was continuously monitored during the procedure by the interventional radiology nurse under my direct supervision. COMPLICATIONS: None TECHNIQUE: Informed written consent was obtained from the patient after a thorough discussion of the procedural risks, benefits and alternatives. All questions were addressed. Maximal Sterile Barrier Technique was utilized including caps, mask, sterile gowns, sterile gloves, sterile drape, hand hygiene and skin antiseptic. A timeout was performed prior to the initiation of the procedure. PROCEDURE: Informed written consent was obtained from the patient's family after a thorough discussion of the procedural risks, benefits and alternatives. All questions  were addressed. Maximal Sterile Barrier Technique was utilized including caps, mask, sterile gowns, sterile gloves, sterile drape, hand hygiene and skin antiseptic. A timeout was performed prior to the initiation of the procedure. Patient positioned right decubitus position on the CT table. Scout CT was performed for planning purposes. Ultrasound survey of the left flank was performed with images stored and sent to PACs. The patient was then prepped and draped in the usual sterile fashion. 1% lidocaine was used to anesthetize the skin and subcutaneous tissues for local anesthesia. An 18 gauge trocar needle was then used to access a posterior inferior calyx with ultrasound guidance. With spontaneous urine returned through the needle, 035 wire was passed into the collecting system. CT was performed to confirm location. A small incision was made with an 11 blade scalpel, and the needle was removed from the wire. Ten French dilation was performed. Ten [place] drain was then advanced over the wire into the collecting system. Wire was  removed.  Catheter pigtail was locked.  CT was performed. Catheter was sutured to the skin and attached to gravity drainage. Patient tolerated the procedure well and remained hemodynamically stable throughout. No complications were encountered and no significant blood loss encountered FINDINGS: Scout CT in right decubitus position demonstrates persisting left-sided hydronephrosis. Images during the case demonstrate placement of 10 French drain through inferior calyx into the collecting system. IMPRESSION: Status post CT-guided placement of left-sided percutaneous nephrostomy. Signed, Dulcy Fanny. Dellia Nims, RPVI [facility] Radiology PLAN: A single left-sided percutaneous access was performed given the need for positioning in the right decubitus position secondary to the patient's respiratory status. If need be, we may re-evaluate for possible right-sided percutaneous nephrostomy. Electronically Signed   By: Corrie Mckusick D.O.   On: 12/26/2018 11:30     Medications:   . sodium chloride    . sodium chloride Stopped (12/26/18 1023)  . ceFEPime (MAXIPIME) IV 1 g (12/27/18 0853)   . amLODipine  2.5 mg Oral Daily  . finasteride  5 mg Oral Daily  . furosemide  40 mg Intravenous Q12H  . heparin injection (subcutaneous)  5,000 Units Subcutaneous Q8H  . insulin aspart  0-5 Units Subcutaneous QHS  . insulin aspart  0-9 Units Subcutaneous TID WC  . metoprolol tartrate  50 mg Oral BID  . nitroGLYCERIN  0.5 inch Topical Q6H  . pantoprazole  40 mg Oral Daily  . rosuvastatin  5 mg Oral QHS  . sodium bicarbonate  650 mg Oral BID  . sodium chloride flush  3 mL Intravenous Q12H  . tamsulosin  0.4 mg Oral Daily   sodium chloride, acetaminophen **OR** acetaminophen, ondansetron **OR** ondansetron (ZOFRAN) IV, sodium chloride flush  Assessment/ Plan:  [name] is a 85 y.o. black male with high-grade bladder cancer, incompletely resected tumor, severe  bilateral hydronephrosis, history of prostate cancer, recurrent UTIs, atrial fibrillation, pacemaker/defibrillator, diabetes mellitus type II who is admitted to [facility] on 12/25/2018 for bilateral hydronephrosis  1. Acute renal failure with obstructive uropathy. With metabolic acidosis and hyponatremia.  On Chronic kidney disease stage IV.  Baseline creatinine 2.22, GFR of 28 09/13/2018 - sodium bicarbonate PO - status post left percutaneous nephrostomy tube placement by IR on 6/16.  - Appreciate urology input. Recommend right sided nephrostomy tube once patient is stable.  - Hold diuretics. Monitor post obstructive diuresis.   2. Hypertension with diastolic congestive heart failure:  - metoprolol, tamsulosin  3. Urinary tract infection: blood cultures with pseudomonas -  empiric azithromycin and cefepime.    LOS: 2 Alijah Hyde 6/17/202011:16 AM

## 2018-12-27 NOTE — Consult Note (Signed)
NAME: Blake [name]  DOB: Mar 19, 1923  MRN: [id number]  Date/Time: 12/27/2018 12:58 PM  REQUESTING PROVIDER: Posey Pronto Subjective:  REASON FOR CONSULT: Pseudomonas bacteremia ? Blake [name] is a 85 y.o. male with a history of prostate cancer, bladder cancer and resected bladder tumors with Bilateral hydro-ureteronephrosis since January 2018 likely due to either chronic bladder outlet obstruction versus bilateral ureteral scarring from radiation for prostate cancer, recurrent UTI, CKD, cardiac pacemaker  Patient presented to the ED on 12/25/2018 with increasing shortness of breath pulse ox of 89% as per EMS.  He was also having cough.  His vitals in the ED were blood pressure 161/99 pulse of 72 temperature of 98.4 and respiratory rate of 27. Chest x-ray showed bibasilar infiltrates edema and small bilateral pleural effusion.  WBC was 8.5, hemoglobin was 8.3, creatinine of 5.  2 1 In the ER he was evaluated and found to have findings consistent with CHF. Blood culture and urine culture were sent and he was started on IV ceftriaxone and azithromycin for  possible pneumonia.  The cultures came back positive for Pseudomonas and I am asked to see the patient.  Antibiotic has been changed to cefepime.   Patient has got a complicated urological history. He has high-grade bladder cancer which is untreated tumor with recurrent urinary tract infection and is colonized with Pseudomonas and enterococcus and has been on multiple antibiotics in the past with recent Keflex low-dose prophylaxis.  Patient was hospitalized between 5 Nov 20, 2020 Dec 07, 2018 for acute on chronic renal failure stage IV with chronic bilateral hydronephrosis and sepsis with Pseudomonas and he was started put on IV fluids and IV antibiotic he was sent home on IV ceftaz edema to complete 12/03/2018  Past medical history Recurrent UTi Klebsiella bacteremia B/l hydroureteronephrosis Bladder ca not treated Prostate ca AAA Atrial  fibrillation Cardiac pacemaker Degenerative arthritis Diabetes mellitus type 2 Hematuria Hyperlipidemia Hypertension Heart murmur  Past surgical history Appendectomy Pacemaker insertion Total knee replacement left side 2008 Bladder surgery - polypectomy Prostate surgery - resection of prostate cancer  Social history Former smoker No alcohol or drug use. Lives with his daughter who is a Pharmacist, community  Family History  Problem Relation Age of Onset  . Hypertension Other   . Stroke Other   . Heart attack Other   . Stroke Sister   . Heart attack Brother    Allergies  Allergen Reactions  . Diovan [Valsartan] Cough  . Gabapentin Other (See Comments)    Sedation at all doses  . Hydralazine Itching  . Lisinopril Cough  . Pregabalin Other (See Comments)    Sedation at all doses  . Levofloxacin Other (See Comments)    Too many side effects. Nausea, vomiting, upset stomach, increased confusion, etc Other reaction(s): Confusion Nausea, chills Nausea, chills   . Sulfamethoxazole-Trimethoprim Other (See Comments)    Too many side effects. Nausea, vomiting, upset stomach, increased confusion, etc    ? Current Facility-Administered Medications  Medication Dose Route Frequency Provider Last Rate Last Dose  . 0.9 %  sodium chloride infusion  250 mL Intravenous PRN Dustin Flock, MD      . 0.9 %  sodium chloride infusion   Intravenous Continuous Corrie Mckusick, DO   Stopped at 12/26/18 1023  . acetaminophen (TYLENOL) tablet 650 mg  650 mg Oral Q6H PRN Dustin Flock, MD   650 mg at 12/26/18 2039   Or  . acetaminophen (TYLENOL) suppository 650 mg  650 mg Rectal Q6H PRN Dustin Flock,  MD      . amLODipine (NORVASC) tablet 2.5 mg  2.5 mg Oral Daily Dustin Flock, MD   2.5 mg at 12/27/18 0837  . ceFEPIme (MAXIPIME) 1 g in sodium chloride 0.9 % 100 mL IVPB  1 g Intravenous Q24H Dustin Flock, MD 200 mL/hr at 12/27/18 0853 1 g at 12/27/18 0853  . finasteride (PROSCAR) tablet 5 mg  5  mg Oral Daily Dustin Flock, MD   5 mg at 12/27/18 0837  . furosemide (LASIX) injection 40 mg  40 mg Intravenous Q12H Dustin Flock, MD   40 mg at 12/27/18 0838  . heparin injection 5,000 Units  5,000 Units Subcutaneous Q8H Dustin Flock, MD   5,000 Units at 12/27/18 0529  . insulin aspart (novoLOG) injection 0-5 Units  0-5 Units Subcutaneous QHS Dustin Flock, MD      . insulin aspart (novoLOG) injection 0-9 Units  0-9 Units Subcutaneous TID WC Dustin Flock, MD   2 Units at 12/27/18 1210  . metoprolol tartrate (LOPRESSOR) tablet 50 mg  50 mg Oral BID Dustin Flock, MD   50 mg at 12/27/18 0850  . nitroGLYCERIN (NITROGLYN) 2 % ointment 0.5 inch  0.5 inch Topical Q6H Dustin Flock, MD   0.5 inch at 12/27/18 1211  . ondansetron (ZOFRAN) tablet 4 mg  4 mg Oral Q6H PRN Dustin Flock, MD       Or  . ondansetron (ZOFRAN) injection 4 mg  4 mg Intravenous Q6H PRN Dustin Flock, MD      . pantoprazole (PROTONIX) EC tablet 40 mg  40 mg Oral Daily Dustin Flock, MD   40 mg at 12/27/18 0837  . rosuvastatin (CRESTOR) tablet 5 mg  5 mg Oral QHS Dustin Flock, MD   5 mg at 12/26/18 2107  . sodium bicarbonate tablet 650 mg  650 mg Oral BID Dustin Flock, MD   650 mg at 12/27/18 0837  . sodium chloride flush (NS) 0.9 % injection 3 mL  3 mL Intravenous Q12H Dustin Flock, MD   3 mL at 12/27/18 0838  . sodium chloride flush (NS) 0.9 % injection 3 mL  3 mL Intravenous PRN Dustin Flock, MD      . tamsulosin [facility]) capsule 0.4 mg  0.4 mg Oral Daily Dustin Flock, MD   0.4 mg at 12/26/18 1713     Abtx:  Anti-infectives (From admission, onward)   Start     Dose/Rate Route Frequency Ordered Stop   12/27/18 1000  ceFEPIme (MAXIPIME) 1 g in sodium chloride 0.9 % 100 mL IVPB     1 g 200 mL/hr over 30 Minutes Intravenous Every 24 hours 12/26/18 0839     12/26/18 0900  cefTRIAXone (ROCEPHIN) 1 g in sodium chloride 0.9 % 100 mL IVPB  Status:  Discontinued     1 g 200 mL/hr over 30 Minutes  Intravenous Every 24 hours 12/25/18 0910 12/26/18 0354   12/26/18 0900  azithromycin (ZITHROMAX) 500 mg in sodium chloride 0.9 % 250 mL IVPB  Status:  Discontinued     500 mg 250 mL/hr over 60 Minutes Intravenous Every 24 hours 12/25/18 0910 12/26/18 0354   12/26/18 0400  ceFEPIme (MAXIPIME) 2 g in sodium chloride 0.9 % 100 mL IVPB  Status:  Discontinued     2 g 200 mL/hr over 30 Minutes Intravenous Every 12 hours 12/26/18 0354 12/26/18 0839   12/25/18 0830  cefTRIAXone (ROCEPHIN) 1 g in sodium chloride 0.9 % 100 mL IVPB     1 g 200  mL/hr over 30 Minutes Intravenous  Once 12/25/18 0815 12/25/18 0936   12/25/18 0830  azithromycin (ZITHROMAX) 500 mg in sodium chloride 0.9 % 250 mL IVPB     500 mg 250 mL/hr over 60 Minutes Intravenous  Once 12/25/18 0815 12/25/18 1127      REVIEW OF SYSTEMS:  Const: negative fever, negative chills, negative weight loss Eyes: negative diplopia or visual changes, negative eye pain ENT: negative coryza, negative sore throat Resp:  Cough,no  hemoptysis, dyspnea Cards: negative for chest pain, palpitations, lower extremity edema GU: has a foley GI: Negative for abdominal pain, diarrhea, bleeding, constipation Skin: negative for rash and pruritus Heme: negative for easy bruising and gum/nose bleeding MS: general weakness Neurolo:negative for headaches, dizziness, vertigo, memory problems  Psych: negative for feelings of anxiety, depression  Endocrine: no polyuria Allergy/Immunology- as above Objective:  VITALS:  BP (!) 144/96 (BP Location: Left Arm)   Pulse 77   Temp 98.2 F (36.8 C) (Oral)   Resp 16   Ht 6\' 2"  (1.88 m)   Wt 81.6 kg   SpO2 99%   BMI 23.11 kg/m  PHYSICAL EXAM:  General: Alert, cooperative, no distress, appears young for  age.  Head: Normocephalic, without obvious abnormality, atraumatic. Eyes: Conjunctivae clear, anicteric sclerae. Pupils are equal ENT Nares normal. No drainage or sinus tenderness. Lips, mucosa, and tongue  normal. No Thrush Neck: Supple, symmetrical, no adenopathy, thyroid: non tender no carotid bruit and no JVD. Back: No CVA tenderness. Lungs: b/l air entry Heart: s1s2 pacemaker site looks healthy Abdomen: Soft, left renal nephrostomy - bloody urine in bag Foley catheter Extremities: scar rt knee atraumatic, no cyanosis. No edema. No clubbing Skin: No rashes or lesions. Or bruising Lymph: Cervical, supraclavicular normal. Neurologic: Grossly non-focal Pertinent Labs Lab Results CBC    Component Value Date/Time   WBC 7.0 12/27/2018 0520   RBC 3.03 (L) 12/27/2018 0520   HGB 8.0 (L) 12/27/2018 0520   HGB 11.2 (L) 06/16/2014 1337   HCT 26.3 (L) 12/27/2018 0520   HCT 34.8 (L) 06/16/2014 1337   PLT 131 (L) 12/27/2018 0520   PLT 150 06/16/2014 1337   MCV 86.8 12/27/2018 0520   MCV 89 06/16/2014 1337   MCH 26.4 12/27/2018 0520   MCHC 30.4 12/27/2018 0520   RDW 16.3 (H) 12/27/2018 0520   RDW 14.9 (H) 06/16/2014 1337   LYMPHSABS 0.8 12/25/2018 0615   LYMPHSABS 0.8 (L) 06/16/2014 1337   MONOABS 0.5 12/25/2018 0615   MONOABS 0.5 06/16/2014 1337   EOSABS 0.1 12/25/2018 0615   EOSABS 0.1 06/16/2014 1337   BASOSABS 0.0 12/25/2018 0615   BASOSABS 0.1 06/16/2014 1337    CMP Latest Ref Rng & Units 12/27/2018 12/26/2018 12/25/2018  Glucose 70 - 99 mg/dL 150(H) 141(H) 151(H)  BUN 8 - 23 mg/dL 75(H) 75(H) 70(H)  Creatinine 0.61 - 1.24 mg/dL 5.39(H) 5.66(H) 5.21(H)  Sodium 135 - 145 mmol/L 144 140 139  Potassium 3.5 - 5.1 mmol/L 4.0 4.8 5.1  Chloride 98 - 111 mmol/L 106 105 102  CO2 22 - 32 mmol/L 24 20(L) 23  Calcium 8.9 - 10.3 mg/dL 8.4(L) 8.0(L) 8.2(L)  Total Protein 6.5 - 8.1 g/dL - - 7.8  Total Bilirubin 0.3 - 1.2 mg/dL - - 0.5  Alkaline Phos 38 - 126 U/L - - 67  AST 15 - 41 U/L - - 16  ALT 0 - 44 U/L - - 18      Microbiology: Recent Results (from the past 240 hour(s))  Culture, blood (routine x 2)     Status: None (Preliminary result)   Collection Time: 12/25/18  6:15 AM    Specimen: BLOOD  Result Value Ref Range Status   Specimen Description BLOOD LEFT ANTECUBITAL  Final   Special Requests   Final    BOTTLES DRAWN AEROBIC AND ANAEROBIC Blood Culture adequate volume   Culture   Final    NO GROWTH 2 DAYS Performed at [facility], [address] [place], [place] [address]    Report Status PENDING  Incomplete  Culture, blood (routine x 2)     Status: Abnormal (Preliminary result)   Collection Time: 12/25/18  6:15 AM   Specimen: BLOOD  Result Value Ref Range Status   Specimen Description   Final    BLOOD RIGHT FOREARM Performed at [facility], [address] [place], [place] [address]    Special Requests   Final    BOTTLES DRAWN AEROBIC AND ANAEROBIC Blood Culture results may not be optimal due to an excessive volume of blood received in culture bottles Performed at [facility], [address] [place], [place] [address]    Culture  Setup Time   Final    GRAM NEGATIVE RODS AEROBIC BOTTLE ONLY CRITICAL RESULT CALLED TO, READ BACK BY AND VERIFIED WITH:  DAVID BESANTI PHARMD 0300 12/26/2018 HNM Performed at [facility], [place] [address], [place], [place] [address]    Culture PSEUDOMONAS AERUGINOSA (A)  Final   Report Status PENDING  Incomplete  Urine culture     Status: Abnormal (Preliminary result)   Collection Time: 12/25/18  6:15 AM   Specimen: Urine, Random  Result Value Ref Range Status   Specimen Description   Final    URINE, RANDOM Performed at [facility], [address] [place], [place] [address]    Special Requests   Final    NONE Performed at [facility], [address] [place], [place] [address]    Culture >=100,000 COLONIES/mL PSEUDOMONAS AERUGINOSA (A)  Final   Report Status PENDING  Incomplete  SARS Coronavirus 2 (CEPHEID- Performed in [facility] hospital lab), Hosp Order     Status: None   Collection Time: 12/25/18  6:15 AM   Specimen: Nasopharyngeal  Result Value  Ref Range Status   SARS Coronavirus 2 NEGATIVE NEGATIVE Final    Comment: (NOTE) If result is NEGATIVE SARS-CoV-2 target nucleic acids are NOT DETECTED. The SARS-CoV-2 RNA is generally detectable in upper and lower  respiratory specimens during the acute phase of infection. The lowest  concentration of SARS-CoV-2 viral copies this assay can detect is 250  copies / mL. A negative result does not preclude SARS-CoV-2 infection  and should not be used as the sole basis for treatment or other  patient management decisions.  A negative result may occur with  improper specimen collection / handling, submission of specimen other  than nasopharyngeal swab, presence of viral mutation(s) within the  areas targeted by this assay, and inadequate number of viral copies  (<250 copies / mL). A negative result must be combined with clinical  observations, patient history, and epidemiological information. If result is POSITIVE SARS-CoV-2 target nucleic acids are DETECTED. The SARS-CoV-2 RNA is generally detectable in upper and lower  respiratory specimens dur ing the acute phase of infection.  Positive  results are indicative of active infection with SARS-CoV-2.  Clinical  correlation with patient history and other diagnostic information is  necessary to determine patient infection  status.  Positive results do  not rule out bacterial infection or co-infection with other viruses. If result is PRESUMPTIVE POSTIVE SARS-CoV-2 nucleic acids MAY BE PRESENT.   A presumptive positive result was obtained on the submitted specimen  and confirmed on repeat testing.  While 2019 novel coronavirus  (SARS-CoV-2) nucleic acids may be present in the submitted sample  additional confirmatory testing may be necessary for epidemiological  and / or clinical management purposes  to differentiate between  SARS-CoV-2 and other Sarbecovirus currently known to infect humans.  If clinically indicated additional testing with an  alternate test  methodology [phone]) is advised. The SARS-CoV-2 RNA is generally  detectable in upper and lower respiratory sp ecimens during the acute  phase of infection. The expected result is Negative. Fact Sheet for Patients:  StrictlyIdeas.no Fact Sheet for Healthcare Providers: BankingDealers.co.za This test is not yet approved or cleared by the [place] FDA and has been authorized for detection and/or diagnosis of SARS-CoV-2 by FDA under an Emergency Use Authorization (EUA).  This EUA will remain in effect (meaning this test can be used) for the duration of the COVID-19 declaration under Section 564(b)(1) of the Act, 21 U.S.C. section 360bbb-3(b)(1), unless the authorization is terminated or revoked sooner. Performed at [facility], [address] [place], [place] [address]   Blood Culture ID Panel (Reflexed)     Status: Abnormal   Collection Time: 12/25/18  6:15 AM  Result Value Ref Range Status   Enterococcus species NOT DETECTED NOT DETECTED Final   Listeria monocytogenes NOT DETECTED NOT DETECTED Final   Staphylococcus species NOT DETECTED NOT DETECTED Final   Staphylococcus aureus (BCID) NOT DETECTED NOT DETECTED Final   Streptococcus species NOT DETECTED NOT DETECTED Final   Streptococcus agalactiae NOT DETECTED NOT DETECTED Final   Streptococcus pneumoniae NOT DETECTED NOT DETECTED Final   Streptococcus pyogenes NOT DETECTED NOT DETECTED Final   Acinetobacter baumannii NOT DETECTED NOT DETECTED Final   Enterobacteriaceae species NOT DETECTED NOT DETECTED Final   Enterobacter cloacae complex NOT DETECTED NOT DETECTED Final   Escherichia coli NOT DETECTED NOT DETECTED Final   Klebsiella oxytoca NOT DETECTED NOT DETECTED Final   Klebsiella pneumoniae NOT DETECTED NOT DETECTED Final   Proteus species NOT DETECTED NOT DETECTED Final   Serratia marcescens NOT DETECTED NOT DETECTED Final   Carbapenem  resistance NOT DETECTED NOT DETECTED Final   Haemophilus influenzae NOT DETECTED NOT DETECTED Final   Neisseria meningitidis NOT DETECTED NOT DETECTED Final   Pseudomonas aeruginosa DETECTED (A) NOT DETECTED Final    Comment: CRITICAL RESULT CALLED TO, READ BACK BY AND VERIFIED WITH: DAVID BESANTI PHARMD 0300 12/26/2018 HNM    Candida albicans NOT DETECTED NOT DETECTED Final   Candida glabrata NOT DETECTED NOT DETECTED Final   Candida krusei NOT DETECTED NOT DETECTED Final   Candida parapsilosis NOT DETECTED NOT DETECTED Final   Candida tropicalis NOT DETECTED NOT DETECTED Final    Comment: Performed at [facility], [address] [place], [place] [address]    IMAGING RESULTS:   I have personally reviewed the films ? Impression/Recommendation ? ?Pseudomonas bacteremia secondary to pseudomonas UTI- on cefepime - will repeat blood culture  B/l hydroureteronephrosis secondary to out flow obstruction/scrring/fibroiss and bladder ca- has foley since 3 weeks but no change- had left nephrostomy yesterday.   AKI on CKD - secondary to the above  Bladder ca  CHF On frusemide  Pacemaker  ? H/ prostate cancer- had surgery and radiation in the  past ___________________________________________________ Discussed with patient Note:  This document was prepared using Dragon voice recognition software and may include unintentional dictation errors.

## 2018-12-28 LAB — RENAL FUNCTION PANEL
Albumin: 2.6 g/dL — ABNORMAL LOW (ref 3.5–5.0)
Anion gap: 12 (ref 5–15)
BUN: 69 mg/dL — ABNORMAL HIGH (ref 8–23)
CO2: 26 mmol/L (ref 22–32)
Calcium: 8.4 mg/dL — ABNORMAL LOW (ref 8.9–10.3)
Chloride: 106 mmol/L (ref 98–111)
Creatinine, Ser: 5.28 mg/dL — ABNORMAL HIGH (ref 0.61–1.24)
GFR calc Af Amer: 10 mL/min — ABNORMAL LOW (ref >60)
GFR calc non Af Amer: 9 mL/min — ABNORMAL LOW (ref >60)
Glucose, Bld: 158 mg/dL — ABNORMAL HIGH (ref 70–99)
Phosphorus: 5.2 mg/dL — ABNORMAL HIGH (ref 2.5–4.6)
Potassium: 3.8 mmol/L (ref 3.5–5.1)
Sodium: 144 mmol/L (ref 135–145)

## 2018-12-28 LAB — CULTURE, BLOOD (ROUTINE X 2)

## 2018-12-28 LAB — CBC
HCT: 26.5 % — ABNORMAL LOW (ref 39.0–52.0)
Hemoglobin: 8.1 g/dL — ABNORMAL LOW (ref 13.0–17.0)
MCH: 26.6 pg (ref 26.0–34.0)
MCHC: 30.6 g/dL (ref 30.0–36.0)
MCV: 86.9 fL (ref 80.0–100.0)
Platelets: 128 10*3/uL — ABNORMAL LOW (ref 150–400)
RBC: 3.05 MIL/uL — ABNORMAL LOW (ref 4.22–5.81)
RDW: 16 % — ABNORMAL HIGH (ref 11.5–15.5)
WBC: 6.7 10*3/uL (ref 4.0–10.5)
nRBC: 0 % (ref 0.0–0.2)

## 2018-12-28 LAB — URINE CULTURE: Culture: >=100000 — AB

## 2018-12-28 LAB — GLUCOSE, CAPILLARY
Glucose-Capillary: 134 mg/dL — ABNORMAL HIGH (ref 70–99)
Glucose-Capillary: 111 mg/dL — ABNORMAL HIGH (ref 70–99)
Glucose-Capillary: 148 mg/dL — ABNORMAL HIGH (ref 70–99)
Glucose-Capillary: 142 mg/dL — ABNORMAL HIGH (ref 70–99)

## 2018-12-28 NOTE — Care Management Important Message (Signed)
Important Message  Patient Details  Name: Blake [name] MRN: [id number] Date of Birth: 1923-03-10   Medicare Important Message Given:  Yes    [name] 12/28/2018, 12:45 PM

## 2018-12-28 NOTE — Consult Note (Signed)
Pharmacy Antibiotic Note  Blake [name] is a 85 y.o. male admitted on 12/25/2018 with bacteremia secondary to UTI.  Pharmacy has been consulted for cefepime dosing.  Plan: Cefepime 1g q24H per renal functions. Not on HD. Repeat Bcx - previous culture grew PSA - sensitive to cefepime. Day 4 - recommend a total of 7-10 day of therapy for GNR bacteremia.   Height: 6\' 2"  (188 cm) Weight: 180 lb (81.6 kg) IBW/kg (Calculated) : 82.2  Temp (24hrs), Avg:98.4 F (36.9 C), Min:98.1 F (36.7 C), Max:98.6 F (37 C)  Recent Labs  Lab 12/21/18 1429 12/25/18 0615 12/25/18 1334 12/26/18 0255 12/27/18 0520 12/28/18 0337  WBC  --  8.5  --  9.2 7.0 6.7  CREATININE 5.68* 5.21*  --  5.66* 5.39* 5.28*  LATICACIDVEN  --  1.3 1.0  --   --   --     Estimated Creatinine Clearance: 9.9 mL/min (A) (by C-G formula based on SCr of 5.28 mg/dL (H)).    Allergies  Allergen Reactions  . Diovan [Valsartan] Cough  . Gabapentin Other (See Comments)    Sedation at all doses  . Hydralazine Itching  . Lisinopril Cough  . Pregabalin Other (See Comments)    Sedation at all doses  . Levofloxacin Other (See Comments)    Too many side effects. Nausea, vomiting, upset stomach, increased confusion, etc Other reaction(s): Confusion Nausea, chills Nausea, chills   . Sulfamethoxazole-Trimethoprim Other (See Comments)    Too many side effects. Nausea, vomiting, upset stomach, increased confusion, etc     Thank you for allowing pharmacy to be a part of this patient's care.  Oswald Hillock, PharmD, BCPS 12/28/2018 10:33 AM

## 2018-12-28 NOTE — Progress Notes (Addendum)
Daily Progress Note   Patient Name: Blake [name]       Date: 12/28/2018 DOB: [birth year]  Age: 85 y.o. MRN#: [id number] Attending Physician: Henreitta Leber, MD Primary Care Physician: Adin Hector, MD Admit Date: 12/25/2018  Reason for Consultation/Follow-up: Establishing goals of care  Subjective: Patient sitting up in chair looking out the window. Much more awake and alert today. States he feels much better today. Denies shortness of breath or pain. Continues to have good output via nephrostomy tube. No family at the bedside.   Called and spoke with his son and provided updates. He stated he would pass along with his sister after work. States they remain undecided regarding outpatient palliative support. All questions answered and he is aware to call with any further questions or needs.   Length of Stay: 3  Current Medications: Scheduled Meds:  . amLODipine  2.5 mg Oral Daily  . finasteride  5 mg Oral Daily  . furosemide  40 mg Intravenous Q12H  . heparin injection (subcutaneous)  5,000 Units Subcutaneous Q8H  . insulin aspart  0-5 Units Subcutaneous QHS  . insulin aspart  0-9 Units Subcutaneous TID WC  . metoprolol tartrate  50 mg Oral BID  . nitroGLYCERIN  0.5 inch Topical Q6H  . pantoprazole  40 mg Oral Daily  . rosuvastatin  5 mg Oral QHS  . sodium bicarbonate  650 mg Oral BID  . sodium chloride flush  3 mL Intravenous Q12H  . tamsulosin  0.4 mg Oral Daily    Continuous Infusions: . sodium chloride    . sodium chloride Stopped (12/26/18 1023)  . ceFEPime (MAXIPIME) IV 1 g (12/28/18 0928)    PRN Meds: sodium chloride, acetaminophen **OR** acetaminophen, ondansetron **OR** ondansetron (ZOFRAN) IV, sodium chloride flush  Physical Exam Vitals signs and nursing note  reviewed.  Constitutional:      General: He is awake.     Comments: Frail, chronically ill appearing   Cardiovascular:     Rate and Rhythm: Tachycardia present.     Pulses: Normal pulses.     Heart sounds: Normal heart sounds.  Pulmonary:     Effort: Pulmonary effort is normal.     Breath sounds: Decreased breath sounds present.  Neurological:     Mental Status: He is alert and oriented to  person, place, and time.  Psychiatric:        Behavior: Behavior is cooperative.            Vital Signs: BP 107/64 (BP Location: Right Arm)   Pulse 80   Temp 98.3 F (36.8 C) (Oral)   Resp 18   Ht 6\' 2"  (1.88 m)   Wt 81.6 kg   SpO2 98%   BMI 23.11 kg/m  SpO2: SpO2: 98 % O2 Device: O2 Device: Nasal Cannula O2 Flow Rate: O2 Flow Rate (L/min): 1 L/min  Intake/output summary:   Intake/Output Summary (Last 24 hours) at 12/28/2018 1435 Last data filed at 12/28/2018 1046 Gross per 24 hour  Intake 1628 ml  Output 825 ml  Net 803 ml   LBM: Last BM Date: 12/22/18(per patient ) Baseline Weight: Weight: 81.6 kg Most recent weight: Weight: 81.6 kg       Palliative Assessment/Data: PPS 30 %      Patient Active Problem List   Diagnosis Date Noted  . Acute CHF (congestive heart failure) ([place]) 12/25/2018  . Acute renal failure (ARF) ([place]) 11/21/2018  . AKI (acute kidney injury) ([place])   . Goals of care, counseling/discussion   . Palliative care by specialist   . DNR (do not resuscitate) discussion   . Seizure-like activity ([place]) 11/05/2018  . Allergic rhinitis 07/24/2018  . Cardiac pacemaker in situ 07/24/2018  . History of sleep apnea 07/24/2018  . Macular degeneration 07/24/2018  . Trigeminal neuralgia pain 01/11/2018  . TMJ pain dysfunction syndrome 01/11/2018  . Recurrent UTI 09/26/2017  . Urothelial carcinoma of bladder ([place]) 09/26/2017  . Pressure injury of skin 09/12/2017  . SIRS (systemic inflammatory response syndrome) ([place]) 09/10/2017  . Chronic cystitis 08/25/2017  .  Sepsis ([place]) 06/10/2017  . CAP (community acquired pneumonia) 06/10/2017  . Infection due to enterococcus 06/07/2017  . UTI (urinary tract infection) 05/17/2017  . Blood in stool   . Hemorrhage of rectum and anus   . Lower GI bleed 10/20/2016  . Near syncope   . Acute renal failure superimposed on chronic kidney disease ([place])   . Pneumonia 10/09/2016  . Aspiration pneumonia ([place]) 10/09/2016  . Acute respiratory failure ([place]) 10/09/2016  . Hydronephrosis, bilateral 07/19/2016  . Spinal stenosis of lumbar region with neurogenic claudication 07/19/2016  . AAA (abdominal aortic aneurysm) without rupture ([place]) 04/27/2016  . Arthritis, degenerative 03/31/2016  . A-fib ([place]) 03/31/2016  . HLD (hyperlipidemia) 03/31/2016  . CA of prostate ([place]) 03/31/2016  . Central perforation of tympanic membrane of right ear 03/26/2016  . 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 Presbycusis of left ear with restricted hearing of right ear 03/01/2016  . Chronic kidney disease, stage 3 (moderate) ([place]) 10/30/2015  . Episode of unresponsiveness 10/30/2015  . Sick sinus syndrome ([place]) 10/30/2015  . Malignant neoplasm of overlapping sites of bladder ([place]) 09/26/2015  . Urinary retention 09/17/2015  . Bladder neoplasm of uncertain malignant potential 08/13/2015  . Thrombocytopenia, unspecified ([place]) 07/24/2015  . Fitting or adjustment of cardiac pacemaker 05/09/2015  . SVT (supraventricular tachycardia) ([place]) 04/09/2015  . Diabetes mellitus, type 2 ([place]) 04/09/2015  . Benign hypertension 04/09/2015  . Acute myocardial infarction, initial episode of care ([place]) 04/09/2015  . Phimosis 01/30/2014  . Bladder outlet obstruction 04/18/2013  . Dysuria 04/18/2013  . Gross hematuria 04/18/2013  . Urethral valve, acquired 04/18/2013  . Incomplete emptying of bladder 11/26/2012  . Personal history of prostate  cancer 11/26/2012    Palliative  Care Assessment & Plan   Patient Profile: Palliative Care consult requested for this 85 y.o. male with multiple medical problems including hyperlipidemia, hypertension, high-grade bladder cancer (incompletely resected tumor), prostate cancer H/P radiation therapy, Bilateral hydronephrosis, type 2 diabetes, abdominal aortic aneurysm, arrhythmias, pacemaker, kidney disease stage IV, and fibrillation.  Patient presented with shortness of breath and cough.  ED work-up identified findings consistent with acute diastolic CHF in the setting of chronic bilateral hydronephrosis.  Recommendations/Plan:  Full Code  Continue with current care plan  Family considering outpatient palliative. Goals are set and clear, full code, full aggressive interventions.   PMT will continue to support and follow as needed. Please call.   Goals of Care and Additional Recommendations:  Limitations on Scope of Treatment: Full Scope Treatment  Code Status:    Code Status Orders  (From admission, onward)         Start     Ordered   12/25/18 1234  Full code  Continuous     12/25/18 1233        Code Status History    Date Active Date Inactive Code Status Order ID Comments User Context   12/25/2018 0645 12/25/2018 1233 Full Code [id number]  Paulette Blanch, MD ED   11/21/2018 2217 11/29/2018 2052 Full Code [id number]  Mayer Camel, NP ED   11/05/2018 1755 11/09/2018 1728 Full Code [id number]  Mayo, Pete Pelt, MD Inpatient   09/10/2017 1430 09/13/2017 2115 Full Code [id number]  Idelle Crouch, MD Inpatient   06/10/2017 2304 06/12/2017 1908 Full Code [id number]  Lance Coon, MD Inpatient   05/17/2017 0829 05/19/2017 1525 Full Code [id number]  Hillary Bow, MD ED   03/31/2017 1334 04/03/2017 1611 Full Code [id number]  Nicholes Mango, MD Inpatient   10/21/2016 0155 10/25/2016 2145 Full Code [id number]  Hugelmeyer, Cos Cob, DO Inpatient   10/09/2016 2342 10/13/2016 2009 Full Code [id number]  Vaughan Basta, MD Inpatient    04/14/2015 1432 04/15/2015 1739 Full Code [id number]  Isaias Cowman, MD Inpatient   04/09/2015 0022 04/14/2015 1432 Full Code [id number]  Hower, Aaron Mose, MD ED   Advance Care Planning Activity    Advance Directive Documentation     Most Recent Value  Type of Advance Directive  Healthcare Power of Attorney  Pre-existing out of facility DNR order (yellow form or pink MOST form)  -  "MOST" Form in Place?  -      Prognosis:   Guarded   Discharge Planning:  To Be Determined  Care plan was discussed with patient's son and bedside RN.   Thank you for allowing the Palliative Medicine Team to assist in the care of this patient.  Total Time: 35 min.   Greater than 50%  of this time was spent counseling and coordinating care related to the above assessment and plan.  Alda Lea, AGPCNP-BC Palliative Medicine Team  Phone: [phone] Pager: [phone] Amion: Bjorn Pippin    Please contact Palliative Medicine Team phone at [phone] for questions and concerns.

## 2018-12-28 NOTE — Progress Notes (Signed)
[facility] [place] NAME: Blake [name]    MR#:  [id number]  DATE OF BIRTH:  [birth year]  SUBJECTIVE:   No acute events overnight.  Renal function about the same. Patient is status post left-sided percutaneous nephrostomy tube placement.  Possible plan for right-sided nephrostomy tube in the near future.  REVIEW OF SYSTEMS:    Review of Systems  Constitutional: Negative for chills and fever.  HENT: Negative for congestion and tinnitus.   Eyes: Negative for blurred vision and double vision.  Respiratory: Negative for cough, shortness of breath and wheezing.   Cardiovascular: Negative for chest pain, orthopnea and PND.  Gastrointestinal: Negative for abdominal pain, diarrhea, nausea and vomiting.  Genitourinary: Negative for dysuria and hematuria.  Neurological: Negative for dizziness, sensory change and focal weakness.  All other systems reviewed and are negative.   Nutrition: Heart Healthy Tolerating Diet: Yes Tolerating PT:  Await Eval.      DRUG ALLERGIES:   Allergies  Allergen Reactions  . Diovan [Valsartan] Cough  . Gabapentin Other (See Comments)    Sedation at all doses  . Hydralazine Itching  . Lisinopril Cough  . Pregabalin Other (See Comments)    Sedation at all doses  . Levofloxacin Other (See Comments)    Too many side effects. Nausea, vomiting, upset stomach, increased confusion, etc Other reaction(s): Confusion Nausea, chills Nausea, chills   . Sulfamethoxazole-Trimethoprim Other (See Comments)    Too many side effects. Nausea, vomiting, upset stomach, increased confusion, etc    VITALS:  Blood pressure 107/64, pulse 80, temperature 98.3 F (36.8 C), temperature source Oral, resp. rate 18, height 6\' 2"  (1.88 m), weight 81.6 kg, SpO2 98 %.  PHYSICAL EXAMINATION:   Physical Exam  GENERAL:  85 y.o.-year-old patient lying in bed in no acute distress.  EYES: Pupils equal, round, reactive to light and  accommodation. No scleral icterus. Extraocular muscles intact.  HEENT: Head atraumatic, normocephalic. Oropharynx and nasopharynx clear.  NECK:  Supple, no jugular venous distention. No thyroid enlargement, no tenderness.  LUNGS: Normal breath sounds bilaterally, no wheezing, rales, rhonchi. No use of accessory muscles of respiration.  CARDIOVASCULAR: S1, S2 normal. No murmurs, rubs, or gallops.  ABDOMEN: Soft, nontender, nondistended. Bowel sounds present. No organomegaly or mass.  EXTREMITIES: No cyanosis, clubbing or edema b/l.    NEUROLOGIC: Cranial nerves II through XII are intact. No focal Motor or sensory deficits b/l. Globally weak.   PSYCHIATRIC: The patient is alert and oriented x 3.  SKIN: No obvious rash, lesion, or ulcer.   + Foley cath in place with some Hematuria noted.  Left sided Nephrostomy tube in place with some blood tinged urine noted.    LABORATORY PANEL:   CBC Recent Labs  Lab 12/28/18 0337  WBC 6.7  HGB 8.1*  HCT 26.5*  PLT 128*   ------------------------------------------------------------------------------------------------------------------  Chemistries  Recent Labs  Lab 12/25/18 0615  12/28/18 0337  NA 139   < > 144  K 5.1   < > 3.8  CL 102   < > 106  CO2 23   < > 26  GLUCOSE 151*   < > 158*  BUN 70*   < > 69*  CREATININE 5.21*   < > 5.28*  CALCIUM 8.2*   < > 8.4*  AST 16  --   --   ALT 18  --   --   ALKPHOS 67  --   --   BILITOT 0.5  --   --    < > =  values in this interval not displayed.   ------------------------------------------------------------------------------------------------------------------  Cardiac Enzymes Recent Labs  Lab 12/25/18 0615  TROPONINI 0.06*   ------------------------------------------------------------------------------------------------------------------  RADIOLOGY:  No results found.   ASSESSMENT AND PLAN:   85 year old male with past medical history of abdominal aortic aneurysm, hypertension,  hyperlipidemia, history of prostate cancer, diabetes who presented to the hospital due to bilateral hydroureteronephrosis, increasing shortness of breath and noted to be in acute renal failure.  1.  Acute on chronic diastolic CHF-this is a source of patient's worsening shortness of breath. - Improving with IV diuresis and will continue to monitor. - Follow I's and O's and daily weights.  Continue Norvasc, metoprolol.  2.  Acute renal failure- secondary to obstruction from bilateral hydroureteronephrosis. -Patient is status post percutaneous nephrostomy tube on the left and possible plan for intervention on the right. -Renal function at baseline is around 2.3 and currently elevated over 5.  Nephrology following.  Continue sodium bicarbonate, no acute indication for hemodialysis presently.  3.  Pseudomonas bacteremia- secondary to UTI. - Continue IV cefepime.  Await infectious disease input  4. Essential Hypertension - cont. Norvasc, Metoprolol.   5. DM Type II - cont. SSI and follow BS.   6. GERD - cont. Protonix.   7. Hyperlipidemia - cont. Crestor.   8. BPH - cont. Flomax, Finasteride.  - s/p IR guided Perc. Nephrostomy tube on left and plan for intervention on right.  - Urology following.     All the records are reviewed and case discussed with Care Management/Social Worker. Management plans discussed with the patient, family and they are in agreement.  CODE STATUS: Full code  DVT Prophylaxis: Hep. SQ  TOTAL TIME TAKING CARE OF THIS PATIENT: 30 minutes.   POSSIBLE D/C IN 2-3 DAYS, DEPENDING ON CLINICAL CONDITION.   Henreitta Leber M.D on 12/28/2018 at 3:37 PM  Between 7am to 6pm - Pager - [phone]  After 6pm go to [email or web address] - Proofreader  [facility]  Office  [phone]  CC: Primary care physician; Adin Hector, MD

## 2018-12-28 NOTE — Progress Notes (Signed)
[facility]  ROUNDING NOTE   Subjective:   UOP dropped to 600  Creatinine 5.39 (5.28)  Objective:  Vital signs in last 24 hours:  Temp:  [98.1 F (36.7 C)-98.6 F (37 C)] 98.3 F (36.8 C) (06/18 0746) Pulse Rate:  [70-81] 80 (06/18 1229) Resp:  [18] 18 (06/18 0332) BP: (107-148)/(64-95) 107/64 (06/18 1229) SpO2:  [93 %-100 %] 98 % (06/18 0746)  Weight change:  Filed Weights   12/25/18 0607  Weight: 81.6 kg    Intake/Output: I/O last 3 completed shifts: In: 2400 [Other:2300; IV Piggyback:100] Out: 3149 [Urine:3275]   Intake/Output this shift:  Total I/O In: 378 [I.V.:3; Other:375] Out: 225 [Urine:225]  Physical Exam: General: NAD,   Head: Normocephalic, atraumatic. Moist oral mucosal membranes  Eyes: Anicteric, PERRL  Neck: Supple, trachea midline  Lungs:  Clear to auscultation  Heart: Regular rate and rhythm  Abdomen:  Soft, nontender,   Extremities:  trace  peripheral edema.  Neurologic: Nonfocal, moving all four extremities  Skin: No lesions   GU Left percutaneous nephrostomy tube    Basic Metabolic Panel: Recent Labs  Lab 12/25/18 0615 12/26/18 0255 12/27/18 0520 12/28/18 0337  NA 139 140 144 144  K 5.1 4.8 4.0 3.8  CL 102 105 106 106  CO2 23 20* 24 26  GLUCOSE 151* 141* 150* 158*  BUN 70* 75* 75* 69*  CREATININE 5.21* 5.66* 5.39* 5.28*  CALCIUM 8.2* 8.0* 8.4* 8.4*  PHOS  --   --   --  5.2*    Liver Function Tests: Recent Labs  Lab 12/25/18 0615 12/28/18 0337  AST 16  --   ALT 18  --   ALKPHOS 67  --   BILITOT 0.5  --   PROT 7.8  --   ALBUMIN 3.0* 2.6*   No results for input(s): LIPASE, AMYLASE in the last 168 hours. No results for input(s): AMMONIA in the last 168 hours.  CBC: Recent Labs  Lab 12/25/18 0615 12/26/18 0255 12/27/18 0520 12/28/18 0337  WBC 8.5 9.2 7.0 6.7  NEUTROABS 6.9  --   --   --   HGB 8.3* 8.1* 8.0* 8.1*  HCT 27.8* 26.8* 26.3* 26.5*  MCV 88.3 89.0 86.8 86.9  PLT 128* 130* 131* 128*     Cardiac Enzymes: Recent Labs  Lab 12/25/18 0615  TROPONINI 0.06*    BNP: Invalid input(s): POCBNP  CBG: Recent Labs  Lab 12/27/18 1205 12/27/18 1646 12/27/18 2149 12/28/18 0748 12/28/18 1136  GLUCAP 189* 117* 128* 134* 111*    Microbiology: Results for orders placed or performed during the hospital encounter of 12/25/18  Culture, blood (routine x 2)     Status: None (Preliminary result)   Collection Time: 12/25/18  6:15 AM   Specimen: BLOOD  Result Value Ref Range Status   Specimen Description BLOOD LEFT ANTECUBITAL  Final   Special Requests   Final    BOTTLES DRAWN AEROBIC AND ANAEROBIC Blood Culture adequate volume   Culture   Final    NO GROWTH 3 DAYS Performed at [facility], [address] [place], [place] [address]    Report Status PENDING  Incomplete  Culture, blood (routine x 2)     Status: Abnormal   Collection Time: 12/25/18  6:15 AM   Specimen: BLOOD  Result Value Ref Range Status   Specimen Description   Final    BLOOD RIGHT FOREARM Performed at [facility], [address] [place], [place] [address]  Special Requests   Final    BOTTLES DRAWN AEROBIC AND ANAEROBIC Blood Culture results may not be optimal due to an excessive volume of blood received in culture bottles Performed at [facility], [address], [place], [place] [address]    Culture  Setup Time   Final    GRAM NEGATIVE RODS AEROBIC BOTTLE ONLY CRITICAL RESULT CALLED TO, READ BACK BY AND VERIFIED WITH:  DAVID BESANTI PHARMD 0300 12/26/2018 HNM Performed at [facility], [place] [address] [place], [place] [address]    Culture PSEUDOMONAS AERUGINOSA (A)  Final   Report Status 12/28/2018 FINAL  Final   Organism ID, Bacteria PSEUDOMONAS AERUGINOSA  Final      Susceptibility   Pseudomonas aeruginosa - MIC*    CEFTAZIDIME 4 SENSITIVE Sensitive     CIPROFLOXACIN 2 INTERMEDIATE Intermediate     GENTAMICIN <=1 SENSITIVE Sensitive     IMIPENEM 1  SENSITIVE Sensitive     PIP/TAZO 8 SENSITIVE Sensitive     CEFEPIME 2 SENSITIVE Sensitive     * PSEUDOMONAS AERUGINOSA  Urine culture     Status: Abnormal   Collection Time: 12/25/18  6:15 AM   Specimen: Urine, Random  Result Value Ref Range Status   Specimen Description   Final    URINE, RANDOM Performed at [facility], [address], [place], [place] [address]    Special Requests   Final    NONE Performed at [facility], [address] [place], [place] [address]    Culture >=100,000 COLONIES/mL PSEUDOMONAS AERUGINOSA (A)  Final   Report Status 12/28/2018 FINAL  Final   Organism ID, Bacteria PSEUDOMONAS AERUGINOSA (A)  Final      Susceptibility   Pseudomonas aeruginosa - MIC*    CEFTAZIDIME 4 SENSITIVE Sensitive     CIPROFLOXACIN 2 INTERMEDIATE Intermediate     GENTAMICIN <=1 SENSITIVE Sensitive     IMIPENEM 2 SENSITIVE Sensitive     PIP/TAZO 8 SENSITIVE Sensitive     CEFEPIME 2 SENSITIVE Sensitive     * >=100,000 COLONIES/mL PSEUDOMONAS AERUGINOSA  SARS Coronavirus 2 (CEPHEID- Performed in [place] hospital lab), Hosp Order     Status: None   Collection Time: 12/25/18  6:15 AM   Specimen: Nasopharyngeal  Result Value Ref Range Status   SARS Coronavirus 2 NEGATIVE NEGATIVE Final    Comment: (NOTE) If result is NEGATIVE SARS-CoV-2 target nucleic acids are NOT DETECTED. The SARS-CoV-2 RNA is generally detectable in upper and lower  respiratory specimens during the acute phase of infection. The lowest  concentration of SARS-CoV-2 viral copies this assay can detect is 250  copies / mL. A negative result does not preclude SARS-CoV-2 infection  and should not be used as the sole basis for treatment or other  patient management decisions.  A negative result may occur with  improper specimen collection / handling, submission of specimen other  than nasopharyngeal swab, presence of viral mutation(s) within the  areas targeted by this assay, and  inadequate number of viral copies  (<250 copies / mL). A negative result must be combined with clinical  observations, patient history, and epidemiological information. If result is POSITIVE SARS-CoV-2 target nucleic acids are DETECTED. The SARS-CoV-2 RNA is generally detectable in upper and lower  respiratory specimens dur ing the acute phase of infection.  Positive  results are indicative of active infection with SARS-CoV-2.  Clinical  correlation with patient history and other diagnostic information is  necessary to determine patient  infection status.  Positive results do  not rule out bacterial infection or co-infection with other viruses. If result is PRESUMPTIVE POSTIVE SARS-CoV-2 nucleic acids MAY BE PRESENT.   A presumptive positive result was obtained on the submitted specimen  and confirmed on repeat testing.  While 2019 novel coronavirus  (SARS-CoV-2) nucleic acids may be present in the submitted sample  additional confirmatory testing may be necessary for epidemiological  and / or clinical management purposes  to differentiate between  SARS-CoV-2 and other Sarbecovirus currently known to infect humans.  If clinically indicated additional testing with an alternate test  methodology [phone]) is advised. The SARS-CoV-2 RNA is generally  detectable in upper and lower respiratory sp ecimens during the acute  phase of infection. The expected result is Negative. Fact Sheet for Patients:  StrictlyIdeas.no Fact Sheet for Healthcare Providers: BankingDealers.co.za This test is not yet approved or cleared by the [place] FDA and has been authorized for detection and/or diagnosis of SARS-CoV-2 by FDA under an Emergency Use Authorization (EUA).  This EUA will remain in effect (meaning this test can be used) for the duration of the COVID-19 declaration under Section 564(b)(1) of the Act, 21 U.S.C. section 360bbb-3(b)(1), unless the  authorization is terminated or revoked sooner. Performed at [facility], [address] [place], [place] [address]   Blood Culture ID Panel (Reflexed)     Status: Abnormal   Collection Time: 12/25/18  6:15 AM  Result Value Ref Range Status   Enterococcus species NOT DETECTED NOT DETECTED Final   Listeria monocytogenes NOT DETECTED NOT DETECTED Final   Staphylococcus species NOT DETECTED NOT DETECTED Final   Staphylococcus aureus (BCID) NOT DETECTED NOT DETECTED Final   Streptococcus species NOT DETECTED NOT DETECTED Final   Streptococcus agalactiae NOT DETECTED NOT DETECTED Final   Streptococcus pneumoniae NOT DETECTED NOT DETECTED Final   Streptococcus pyogenes NOT DETECTED NOT DETECTED Final   Acinetobacter baumannii NOT DETECTED NOT DETECTED Final   Enterobacteriaceae species NOT DETECTED NOT DETECTED Final   Enterobacter cloacae complex NOT DETECTED NOT DETECTED Final   Escherichia coli NOT DETECTED NOT DETECTED Final   Klebsiella oxytoca NOT DETECTED NOT DETECTED Final   Klebsiella pneumoniae NOT DETECTED NOT DETECTED Final   Proteus species NOT DETECTED NOT DETECTED Final   Serratia marcescens NOT DETECTED NOT DETECTED Final   Carbapenem resistance NOT DETECTED NOT DETECTED Final   Haemophilus influenzae NOT DETECTED NOT DETECTED Final   Neisseria meningitidis NOT DETECTED NOT DETECTED Final   Pseudomonas aeruginosa DETECTED (A) NOT DETECTED Final    Comment: CRITICAL RESULT CALLED TO, READ BACK BY AND VERIFIED WITH: DAVID BESANTI PHARMD 0300 12/26/2018 HNM    Candida albicans NOT DETECTED NOT DETECTED Final   Candida glabrata NOT DETECTED NOT DETECTED Final   Candida krusei NOT DETECTED NOT DETECTED Final   Candida parapsilosis NOT DETECTED NOT DETECTED Final   Candida tropicalis NOT DETECTED NOT DETECTED Final    Comment: Performed at [facility], [address] [place], [place] [address]    Coagulation Studies: Recent Labs    12/26/18 0255   LABPROT 16.6*  INR 1.4*    Urinalysis: No results for input(s): COLORURINE, LABSPEC, PHURINE, GLUCOSEU, HGBUR, BILIRUBINUR, KETONESUR, PROTEINUR, UROBILINOGEN, NITRITE, LEUKOCYTESUR in the last 72 hours.  Invalid input(s): APPERANCEUR    Imaging: No results found.   Medications:   . sodium chloride    . sodium chloride Stopped (12/26/18 1023)  . ceFEPime (MAXIPIME) IV 1 g (12/28/18 0928)   .  amLODipine  2.5 mg Oral Daily  . finasteride  5 mg Oral Daily  . furosemide  40 mg Intravenous Q12H  . heparin injection (subcutaneous)  5,000 Units Subcutaneous Q8H  . insulin aspart  0-5 Units Subcutaneous QHS  . insulin aspart  0-9 Units Subcutaneous TID WC  . metoprolol tartrate  50 mg Oral BID  . nitroGLYCERIN  0.5 inch Topical Q6H  . pantoprazole  40 mg Oral Daily  . rosuvastatin  5 mg Oral QHS  . sodium bicarbonate  650 mg Oral BID  . sodium chloride flush  3 mL Intravenous Q12H  . tamsulosin  0.4 mg Oral Daily   sodium chloride, acetaminophen **OR** acetaminophen, ondansetron **OR** ondansetron (ZOFRAN) IV, sodium chloride flush  Assessment/ Plan:  [name] is a 85 y.o. black male with high-grade bladder cancer, incompletely resected tumor, severe bilateral hydronephrosis, history of prostate cancer, recurrent UTIs, atrial fibrillation, pacemaker/defibrillator, diabetes mellitus type II who is admitted to [facility] on 12/25/2018 for bilateral hydronephrosis  1. Acute renal failure with obstructive uropathy. With metabolic acidosis and hyponatremia.  On Chronic kidney disease stage IV.  Baseline creatinine 2.22, GFR of 28 09/13/2018 - sodium bicarbonate PO - status post left percutaneous nephrostomy tube placement by IR on 6/16.  - Appreciate urology input. Recommend right sided nephrostomy per  - Hold diuretics. Monitor post obstructive diuresis.   2. Hypertension with diastolic congestive heart failure:  - metoprolol, tamsulosin  3. Urinary tract infection: blood  cultures with pseudomonas - cefepime.   4. Anemia of chronic kidney disease: hemoglobin 8.1    LOS: 3 Blake [name] 6/18/20202:31 PM

## 2018-12-28 NOTE — Progress Notes (Signed)
Pt daughter called. Spoke with pt if I could discuss an update over the phone with daughter. Per pt, he verified yes and daughters name, [name]. Set up password with Ms Primus Bravo and updated her on pt.

## 2018-12-29 ENCOUNTER — Other Ambulatory Visit: Payer: Self-pay

## 2018-12-29 ENCOUNTER — Other Ambulatory Visit: Payer: Self-pay | Admitting: Urology

## 2018-12-29 ENCOUNTER — Inpatient Hospital Stay: Payer: Medicare Other

## 2018-12-29 DIAGNOSIS — N1330 Unspecified hydronephrosis: Secondary | ICD-10-CM

## 2018-12-29 LAB — BASIC METABOLIC PANEL
Anion gap: 14 (ref 5–15)
BUN: 69 mg/dL — ABNORMAL HIGH (ref 8–23)
CO2: 26 mmol/L (ref 22–32)
Calcium: 8.2 mg/dL — ABNORMAL LOW (ref 8.9–10.3)
Chloride: 102 mmol/L (ref 98–111)
Creatinine, Ser: 5.19 mg/dL — ABNORMAL HIGH (ref 0.61–1.24)
GFR calc Af Amer: 10 mL/min — ABNORMAL LOW (ref >60)
GFR calc non Af Amer: 9 mL/min — ABNORMAL LOW (ref >60)
Glucose, Bld: 143 mg/dL — ABNORMAL HIGH (ref 70–99)
Potassium: 3.4 mmol/L — ABNORMAL LOW (ref 3.5–5.1)
Sodium: 142 mmol/L (ref 135–145)

## 2018-12-29 LAB — GLUCOSE, CAPILLARY
Glucose-Capillary: 118 mg/dL — ABNORMAL HIGH (ref 70–99)
Glucose-Capillary: 138 mg/dL — ABNORMAL HIGH (ref 70–99)
Glucose-Capillary: 184 mg/dL — ABNORMAL HIGH (ref 70–99)

## 2018-12-29 LAB — CBC
HCT: 26.7 % — ABNORMAL LOW (ref 39.0–52.0)
Hemoglobin: 8.2 g/dL — ABNORMAL LOW (ref 13.0–17.0)
MCH: 26.5 pg (ref 26.0–34.0)
MCHC: 30.7 g/dL (ref 30.0–36.0)
MCV: 86.4 fL (ref 80.0–100.0)
Platelets: 134 10*3/uL — ABNORMAL LOW (ref 150–400)
RBC: 3.09 MIL/uL — ABNORMAL LOW (ref 4.22–5.81)
RDW: 16 % — ABNORMAL HIGH (ref 11.5–15.5)
WBC: 8.4 10*3/uL (ref 4.0–10.5)
nRBC: 0 % (ref 0.0–0.2)

## 2018-12-29 MED ORDER — MIDAZOLAM HCL 2 MG/2ML IJ SOLN
INTRAMUSCULAR | Status: AC
  Filled 2018-12-29: qty 4

## 2018-12-29 MED ORDER — POTASSIUM CHLORIDE CRYS ER 20 MEQ PO TBCR
20.0000 meq | EXTENDED_RELEASE_TABLET | Freq: Once | ORAL | Status: AC
  Administered 2018-12-29: 18:00:00 20 meq via ORAL
  Filled 2018-12-29: qty 1

## 2018-12-29 MED ORDER — FENTANYL CITRATE (PF) 100 MCG/2ML IJ SOLN
INTRAMUSCULAR | Status: AC
  Filled 2018-12-29: qty 2

## 2018-12-29 MED ORDER — MIDAZOLAM HCL 2 MG/2ML IJ SOLN
INTRAMUSCULAR | Status: AC | PRN
  Administered 2018-12-29 (×2): 0.5 mg via INTRAVENOUS

## 2018-12-29 MED ORDER — LIDOCAINE HCL (PF) 1 % IJ SOLN
INTRAMUSCULAR | Status: AC | PRN
  Administered 2018-12-29: 10 mL

## 2018-12-29 MED ORDER — LIDOCAINE HCL (PF) 1 % IJ SOLN
INTRAMUSCULAR | Status: AC
  Filled 2018-12-29: qty 30

## 2018-12-29 MED ORDER — SODIUM CHLORIDE 0.9 % IV SOLN
INTRAVENOUS | Status: AC | PRN
  Administered 2018-12-29: 10 mL/h via INTRAVENOUS

## 2018-12-29 MED ORDER — FENTANYL CITRATE (PF) 100 MCG/2ML IJ SOLN
INTRAMUSCULAR | Status: AC | PRN
  Administered 2018-12-29 (×3): 25 ug via INTRAVENOUS

## 2018-12-29 NOTE — Consult Note (Signed)
Pharmacy Antibiotic Note  Blake [name] is a 85 y.o. male admitted on 12/25/2018 with bacteremia secondary to UTI.  Pharmacy has been consulted for cefepime dosing.  Plan: Cefepime 1g q24H per renal functions. Not on HD. Repeat Bcx - previous culture grew PSA - sensitive to cefepime. Day 4 - recommend a total of 7-10 day of therapy for GNR bacteremia.   Height: 6\' 2"  (188 cm) Weight: 180 lb (81.6 kg) IBW/kg (Calculated) : 82.2  Temp (24hrs), Avg:98.4 F (36.9 C), Min:97.6 F (36.4 C), Max:98.9 F (37.2 C)  Recent Labs  Lab 12/25/18 0615 12/25/18 1334 12/26/18 0255 12/27/18 0520 12/28/18 0337 12/29/18 0112  WBC 8.5  --  9.2 7.0 6.7 8.4  CREATININE 5.21*  --  5.66* 5.39* 5.28* 5.19*  LATICACIDVEN 1.3 1.0  --   --   --   --     Estimated Creatinine Clearance: 10 mL/min (A) (by C-G formula based on SCr of 5.19 mg/dL (H)).    Allergies  Allergen Reactions  . Diovan [Valsartan] Cough  . Gabapentin Other (See Comments)    Sedation at all doses  . Hydralazine Itching  . Lisinopril Cough  . Pregabalin Other (See Comments)    Sedation at all doses  . Levofloxacin Other (See Comments)    Too many side effects. Nausea, vomiting, upset stomach, increased confusion, etc Other reaction(s): Confusion Nausea, chills Nausea, chills   . Sulfamethoxazole-Trimethoprim Other (See Comments)    Too many side effects. Nausea, vomiting, upset stomach, increased confusion, etc     Thank you for allowing pharmacy to be a part of this patient's care.  Oswald Hillock, PharmD, BCPS 12/29/2018 12:55 PM

## 2018-12-29 NOTE — Progress Notes (Signed)
[facility]  ROUNDING NOTE   Subjective:   UOP 1775 K 3.4  Creatinine 5.19 (5.39) (5.28)  Objective:  Vital signs in last 24 hours:  Temp:  [97.6 F (36.4 C)-98.9 F (37.2 C)] 98.5 F (36.9 C) (06/19 0750) Pulse Rate:  [72-118] 72 (06/19 0750) Resp:  [18] 18 (06/18 2328) BP: (107-151)/(64-97) 107/76 (06/19 0750) SpO2:  [95 %-100 %] 95 % (06/19 0750)  Weight change:  Filed Weights   12/25/18 0607  Weight: 81.6 kg    Intake/Output: I/O last 3 completed shifts: In: 1278 [I.V.:3; Other:1275] Out: 2375 [Urine:2375]   Intake/Output this shift:  Total I/O In: -  Out: 250 [Urine:250]  Physical Exam: General: NAD,   Head: Normocephalic, atraumatic. Moist oral mucosal membranes  Eyes: Anicteric, PERRL  Neck: Supple, trachea midline  Lungs:  Clear to auscultation  Heart: Regular rate and rhythm  Abdomen:  + tender to palpation  Extremities:  trace  peripheral edema.  Neurologic: Nonfocal, moving all four extremities  Skin: No lesions   GU Left percutaneous nephrostomy tube, indwelling foley catheter    Basic Metabolic Panel: Recent Labs  Lab 12/25/18 0615 12/26/18 0255 12/27/18 0520 12/28/18 0337 12/29/18 0112  NA 139 140 144 144 142  K 5.1 4.8 4.0 3.8 3.4*  CL 102 105 106 106 102  CO2 23 20* 24 26 26   GLUCOSE 151* 141* 150* 158* 143*  BUN 70* 75* 75* 69* 69*  CREATININE 5.21* 5.66* 5.39* 5.28* 5.19*  CALCIUM 8.2* 8.0* 8.4* 8.4* 8.2*  PHOS  --   --   --  5.2*  --     Liver Function Tests: Recent Labs  Lab 12/25/18 0615 12/28/18 0337  AST 16  --   ALT 18  --   ALKPHOS 67  --   BILITOT 0.5  --   PROT 7.8  --   ALBUMIN 3.0* 2.6*   No results for input(s): LIPASE, AMYLASE in the last 168 hours. No results for input(s): AMMONIA in the last 168 hours.  CBC: Recent Labs  Lab 12/25/18 0615 12/26/18 0255 12/27/18 0520 12/28/18 0337 12/29/18 0112  WBC 8.5 9.2 7.0 6.7 8.4  NEUTROABS 6.9  --   --   --   --   HGB 8.3* 8.1* 8.0* 8.1*  8.2*  HCT 27.8* 26.8* 26.3* 26.5* 26.7*  MCV 88.3 89.0 86.8 86.9 86.4  PLT 128* 130* 131* 128* 134*    Cardiac Enzymes: Recent Labs  Lab 12/25/18 0615  TROPONINI 0.06*    BNP: Invalid input(s): POCBNP  CBG: Recent Labs  Lab 12/28/18 0748 12/28/18 1136 12/28/18 1645 12/28/18 2104 12/29/18 0752  GLUCAP 134* 111* 148* 142* 118*    Microbiology: Results for orders placed or performed during the hospital encounter of 12/25/18  Culture, blood (routine x 2)     Status: None (Preliminary result)   Collection Time: 12/25/18  6:15 AM   Specimen: BLOOD  Result Value Ref Range Status   Specimen Description BLOOD LEFT ANTECUBITAL  Final   Special Requests   Final    BOTTLES DRAWN AEROBIC AND ANAEROBIC Blood Culture adequate volume   Culture   Final    NO GROWTH 4 DAYS Performed at [facility], [address] [place], [place] [address]    Report Status PENDING  Incomplete  Culture, blood (routine x 2)     Status: Abnormal   Collection Time: 12/25/18  6:15 AM   Specimen: BLOOD  Result Value Ref Range Status  Specimen Description   Final    BLOOD RIGHT FOREARM Performed at [facility], [address] [place], [place] [address]    Special Requests   Final    BOTTLES DRAWN AEROBIC AND ANAEROBIC Blood Culture results may not be optimal due to an excessive volume of blood received in culture bottles Performed at [facility], [address] [place], [place] [address]    Culture  Setup Time   Final    GRAM NEGATIVE RODS AEROBIC BOTTLE ONLY CRITICAL RESULT CALLED TO, READ BACK BY AND VERIFIED WITH:  DAVID BESANTI PHARMD 0300 12/26/2018 HNM Performed at [facility], [place] [address] [place], [place] [address]    Culture PSEUDOMONAS AERUGINOSA (A)  Final   Report Status 12/28/2018 FINAL  Final   Organism ID, Bacteria PSEUDOMONAS AERUGINOSA  Final      Susceptibility   Pseudomonas aeruginosa - MIC*    CEFTAZIDIME 4 SENSITIVE Sensitive      CIPROFLOXACIN 2 INTERMEDIATE Intermediate     GENTAMICIN <=1 SENSITIVE Sensitive     IMIPENEM 1 SENSITIVE Sensitive     PIP/TAZO 8 SENSITIVE Sensitive     CEFEPIME 2 SENSITIVE Sensitive     * PSEUDOMONAS AERUGINOSA  Urine culture     Status: Abnormal   Collection Time: 12/25/18  6:15 AM   Specimen: Urine, Random  Result Value Ref Range Status   Specimen Description   Final    URINE, RANDOM Performed at [facility], [address], [place], [place] [address]    Special Requests   Final    NONE Performed at [facility], [address] [place],  [address]    Culture >=100,000 COLONIES/mL PSEUDOMONAS AERUGINOSA (A)  Final   Report Status 12/28/2018 FINAL  Final   Organism ID, Bacteria PSEUDOMONAS AERUGINOSA (A)  Final      Susceptibility   Pseudomonas aeruginosa - MIC*    CEFTAZIDIME 4 SENSITIVE Sensitive     CIPROFLOXACIN 2 INTERMEDIATE Intermediate     GENTAMICIN <=1 SENSITIVE Sensitive     IMIPENEM 2 SENSITIVE Sensitive     PIP/TAZO 8 SENSITIVE Sensitive     CEFEPIME 2 SENSITIVE Sensitive     * >=100,000 COLONIES/mL PSEUDOMONAS AERUGINOSA  SARS Coronavirus 2 (CEPHEID- Performed in [place] hospital lab), Hosp Order     Status: None   Collection Time: 12/25/18  6:15 AM   Specimen: Nasopharyngeal  Result Value Ref Range Status   SARS Coronavirus 2 NEGATIVE NEGATIVE Final    Comment: (NOTE) If result is NEGATIVE SARS-CoV-2 target nucleic acids are NOT DETECTED. The SARS-CoV-2 RNA is generally detectable in upper and lower  respiratory specimens during the acute phase of infection. The lowest  concentration of SARS-CoV-2 viral copies this assay can detect is 250  copies / mL. A negative result does not preclude SARS-CoV-2 infection  and should not be used as the sole basis for treatment or other  patient management decisions.  A negative result may occur with  improper specimen collection / handling, submission of specimen other  than  nasopharyngeal swab, presence of viral mutation(s) within the  areas targeted by this assay, and inadequate number of viral copies  (<250 copies / mL). A negative result must be combined with clinical  observations, patient history, and epidemiological information. If result is POSITIVE SARS-CoV-2 target nucleic acids are DETECTED. The SARS-CoV-2 RNA is generally detectable in upper and lower  respiratory specimens dur ing the acute phase of infection.  Positive  results are indicative of active infection with SARS-CoV-2.  Clinical  correlation with patient history and other diagnostic information is  necessary to determine patient infection status.  Positive results do  not rule out bacterial infection or co-infection with other viruses. If result is PRESUMPTIVE POSTIVE SARS-CoV-2 nucleic acids MAY BE PRESENT.   A presumptive positive result was obtained on the submitted specimen  and confirmed on repeat testing.  While 2019 novel coronavirus  (SARS-CoV-2) nucleic acids may be present in the submitted sample  additional confirmatory testing may be necessary for epidemiological  and / or clinical management purposes  to differentiate between  SARS-CoV-2 and other Sarbecovirus currently known to infect humans.  If clinically indicated additional testing with an alternate test  methodology [phone]) is advised. The SARS-CoV-2 RNA is generally  detectable in upper and lower respiratory sp ecimens during the acute  phase of infection. The expected result is Negative. Fact Sheet for Patients:  StrictlyIdeas.no Fact Sheet for Healthcare Providers: BankingDealers.co.za This test is not yet approved or cleared by the [place] FDA and has been authorized for detection and/or diagnosis of SARS-CoV-2 by FDA under an Emergency Use Authorization (EUA).  This EUA will remain in effect (meaning this test can be used) for the duration of  the COVID-19 declaration under Section 564(b)(1) of the Act, 21 U.S.C. section 360bbb-3(b)(1), unless the authorization is terminated or revoked sooner. Performed at [facility], [address] [place], [place] [address]   Blood Culture ID Panel (Reflexed)     Status: Abnormal   Collection Time: 12/25/18  6:15 AM  Result Value Ref Range Status   Enterococcus species NOT DETECTED NOT DETECTED Final   Listeria monocytogenes NOT DETECTED NOT DETECTED Final   Staphylococcus species NOT DETECTED NOT DETECTED Final   Staphylococcus aureus (BCID) NOT DETECTED NOT DETECTED Final   Streptococcus species NOT DETECTED NOT DETECTED Final   Streptococcus agalactiae NOT DETECTED NOT DETECTED Final   Streptococcus pneumoniae NOT DETECTED NOT DETECTED Final   Streptococcus pyogenes NOT DETECTED NOT DETECTED Final   Acinetobacter baumannii NOT DETECTED NOT DETECTED Final   Enterobacteriaceae species NOT DETECTED NOT DETECTED Final   Enterobacter cloacae complex NOT DETECTED NOT DETECTED Final   Escherichia coli NOT DETECTED NOT DETECTED Final   Klebsiella oxytoca NOT DETECTED NOT DETECTED Final   Klebsiella pneumoniae NOT DETECTED NOT DETECTED Final   Proteus species NOT DETECTED NOT DETECTED Final   Serratia marcescens NOT DETECTED NOT DETECTED Final   Carbapenem resistance NOT DETECTED NOT DETECTED Final   Haemophilus influenzae NOT DETECTED NOT DETECTED Final   Neisseria meningitidis NOT DETECTED NOT DETECTED Final   Pseudomonas aeruginosa DETECTED (A) NOT DETECTED Final    Comment: CRITICAL RESULT CALLED TO, READ BACK BY AND VERIFIED WITH: DAVID BESANTI PHARMD 0300 12/26/2018 HNM    Candida albicans NOT DETECTED NOT DETECTED Final   Candida glabrata NOT DETECTED NOT DETECTED Final   Candida krusei NOT DETECTED NOT DETECTED Final   Candida parapsilosis NOT DETECTED NOT DETECTED Final   Candida tropicalis NOT DETECTED NOT DETECTED Final    Comment: Performed at [facility], [address] [place], [place] [address]  CULTURE, BLOOD (ROUTINE X 2) w Reflex to ID Panel     Status: None (Preliminary result)   Collection Time: 12/29/18  1:12 AM   Specimen: BLOOD  Result Value Ref Range Status   Specimen Description BLOOD LEFT ANTECUBITAL  Final   Special Requests   Final  BOTTLES DRAWN AEROBIC AND ANAEROBIC Blood Culture adequate volume   Culture   Final    NO GROWTH < 12 HOURS Performed at [facility], [address] [place], [place] [address]    Report Status PENDING  Incomplete  CULTURE, BLOOD (ROUTINE X 2) w Reflex to ID Panel     Status: None (Preliminary result)   Collection Time: 12/29/18  1:17 AM   Specimen: BLOOD  Result Value Ref Range Status   Specimen Description BLOOD RIGHT ANTECUBITAL  Final   Special Requests   Final    BOTTLES DRAWN AEROBIC AND ANAEROBIC Blood Culture adequate volume   Culture   Final    NO GROWTH < 12 HOURS Performed at [facility], [address] [place], [place] [address]    Report Status PENDING  Incomplete    Coagulation Studies: No results for input(s): LABPROT, INR in the last 72 hours.  Urinalysis: No results for input(s): COLORURINE, LABSPEC, PHURINE, GLUCOSEU, HGBUR, BILIRUBINUR, KETONESUR, PROTEINUR, UROBILINOGEN, NITRITE, LEUKOCYTESUR in the last 72 hours.  Invalid input(s): APPERANCEUR    Imaging: No results found.   Medications:   . sodium chloride    . sodium chloride Stopped (12/26/18 1023)  . ceFEPime (MAXIPIME) IV 1 g (12/28/18 0928)   . amLODipine  2.5 mg Oral Daily  . finasteride  5 mg Oral Daily  . furosemide  40 mg Intravenous Q12H  . heparin injection (subcutaneous)  5,000 Units Subcutaneous Q8H  . insulin aspart  0-5 Units Subcutaneous QHS  . insulin aspart  0-9 Units Subcutaneous TID WC  . metoprolol tartrate  50 mg Oral BID  . nitroGLYCERIN  0.5 inch Topical Q6H  . pantoprazole  40 mg Oral Daily  . potassium chloride  20 mEq Oral Once  .  rosuvastatin  5 mg Oral QHS  . sodium chloride flush  3 mL Intravenous Q12H  . tamsulosin  0.4 mg Oral Daily   sodium chloride, acetaminophen **OR** acetaminophen, ondansetron **OR** ondansetron (ZOFRAN) IV, sodium chloride flush  Assessment/ Plan:  Mr. Blake BIELA is a 85 y.o. black male with high-grade bladder cancer, incompletely resected tumor, severe bilateral hydronephrosis, history of prostate cancer, recurrent UTIs, atrial fibrillation, pacemaker/defibrillator, diabetes mellitus type II who is admitted to [facility] on 12/25/2018 for bilateral hydronephrosis  1. Acute renal failure with obstructive uropathy. With metabolic acidosis and hyponatremia.  On Chronic kidney disease stage IV.  Baseline creatinine 2.22, GFR of 28 09/13/2018 Status post left percutaneous nephrostomy tube placement by IR on 6/16.  Appreciate urology input. Recommend right sided nephrostomy per IR.  - furosemide 40mg  IV q12  2. Hypertension with diastolic congestive heart failure:  - metoprolol, tamsulosin  3. Urinary tract infection: blood cultures with pseudomonas - cefepime.   4. Anemia of chronic kidney disease: hemoglobin 8.2  5. Hypokalemia - discontinue sodium bicarbonate - PO potassium   LOS: 4 Charnee Turnipseed 6/19/202011:11 AM

## 2018-12-29 NOTE — Progress Notes (Signed)
PT Cancellation Note  Patient Details Name: Blake [name] MRN: [id number] DOB: 1923/06/08   Cancelled Treatment:    Reason Eval/Treat Not Completed: Patient at procedure or test/unavailable Pt having procedure, unable to participate with PT at this time.  Kreg Shropshire, DPT 12/29/2018, 5:28 PM

## 2018-12-29 NOTE — Progress Notes (Signed)
Daily Progress Note   Patient Name: Blake [name]       Date: 12/29/2018 DOB: 1923/01/25  Age: 85 y.o. MRN#: [id number] Attending Physician: Henreitta Leber, MD Primary Care Physician: Adin Hector, MD Admit Date: 12/25/2018  Reason for Consultation/Follow-up: Establishing goals of care  Subjective: Patient in bed. States he feels "ok, I am just sick all over". Denies pain or shortness of breath. Is awake, alert, and oriented x3. States he is ready to get back home soon. Breakfast tray at the bedside untouched. He requested ice water, cup at the bedside with water and he drank most of it. Offered to set up for breakfast, however he refused stating he did not have much of an appetite.     Length of Stay: 4  Current Medications: Scheduled Meds:  . amLODipine  2.5 mg Oral Daily  . finasteride  5 mg Oral Daily  . furosemide  40 mg Intravenous Q12H  . heparin injection (subcutaneous)  5,000 Units Subcutaneous Q8H  . insulin aspart  0-5 Units Subcutaneous QHS  . insulin aspart  0-9 Units Subcutaneous TID WC  . metoprolol tartrate  50 mg Oral BID  . nitroGLYCERIN  0.5 inch Topical Q6H  . pantoprazole  40 mg Oral Daily  . potassium chloride  20 mEq Oral Once  . rosuvastatin  5 mg Oral QHS  . sodium chloride flush  3 mL Intravenous Q12H  . tamsulosin  0.4 mg Oral Daily    Continuous Infusions: . sodium chloride    . sodium chloride Stopped (12/26/18 1023)  . ceFEPime (MAXIPIME) IV 1 g (12/28/18 0928)    PRN Meds: sodium chloride, acetaminophen **OR** acetaminophen, ondansetron **OR** ondansetron (ZOFRAN) IV, sodium chloride flush  Physical Exam Vitals signs and nursing note reviewed.  Constitutional:      General: He is awake.     Comments: Frail, chronically ill appearing    Cardiovascular:     Rate and Rhythm: Normal rate.     Pulses: Normal pulses.     Heart sounds: Normal heart sounds.  Pulmonary:     Effort: Pulmonary effort is normal.     Breath sounds: Decreased breath sounds present.  Neurological:     Mental Status: He is alert and oriented to person, place, and time.     Motor:  Weakness present.  Psychiatric:        Behavior: Behavior is cooperative.            Vital Signs: BP 107/76 (BP Location: Left Arm)   Pulse 72   Temp 98.5 F (36.9 C) (Oral)   Resp 18   Ht 6\' 2"  (1.88 m)   Wt 81.6 kg   SpO2 95%   BMI 23.11 kg/m  SpO2: SpO2: 95 % O2 Device: O2 Device: Room Air O2 Flow Rate: O2 Flow Rate (L/min): 1 L/min  Intake/output summary:   Intake/Output Summary (Last 24 hours) at 12/29/2018 1231 Last data filed at 12/29/2018 1032 Gross per 24 hour  Intake 300 ml  Output 1800 ml  Net -1500 ml   LBM: Last BM Date: 12/28/18 Baseline Weight: Weight: 81.6 kg Most recent weight: Weight: 81.6 kg       Palliative Assessment/Data: PPS 30 %      Patient Active Problem List   Diagnosis Date Noted  . Acute CHF (congestive heart failure) ([place]) 12/25/2018  . Acute renal failure (ARF) ([place]) 11/21/2018  . AKI (acute kidney injury) ([place])   . Goals of care, counseling/discussion   . Palliative care by specialist   . DNR (do not resuscitate) discussion   . Seizure-like activity ([place]) 11/05/2018  . Allergic rhinitis 07/24/2018  . Cardiac pacemaker in situ 07/24/2018  . History of sleep apnea 07/24/2018  . Macular degeneration 07/24/2018  . Trigeminal neuralgia pain 01/11/2018  . TMJ pain dysfunction syndrome 01/11/2018  . Recurrent UTI 09/26/2017  . Urothelial carcinoma of bladder ([place]) 09/26/2017  . Pressure injury of skin 09/12/2017  . SIRS (systemic inflammatory response syndrome) ([place]) 09/10/2017  . Chronic cystitis 08/25/2017  . Sepsis ([place]) 06/10/2017  . CAP (community acquired pneumonia) 06/10/2017  . Infection due to  enterococcus 06/07/2017  . UTI (urinary tract infection) 05/17/2017  . Blood in stool   . Hemorrhage of rectum and anus   . Lower GI bleed 10/20/2016  . Near syncope   . Acute renal failure superimposed on chronic kidney disease ([place])   . Pneumonia 10/09/2016  . Aspiration pneumonia ([place]) 10/09/2016  . Acute respiratory failure ([place]) 10/09/2016  . Hydronephrosis, bilateral 07/19/2016  . Spinal stenosis of lumbar region with neurogenic claudication 07/19/2016  . AAA (abdominal aortic aneurysm) without rupture ([place]) 04/27/2016  . Arthritis, degenerative 03/31/2016  . A-fib ([place]) 03/31/2016  . HLD (hyperlipidemia) 03/31/2016  . CA of prostate ([place]) 03/31/2016  . Central perforation of tympanic membrane of right ear 03/26/2016  . 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 Presbycusis of left ear with restricted hearing of right ear 03/01/2016  . Chronic kidney disease, stage 3 (moderate) ([place]) 10/30/2015  . Episode of unresponsiveness 10/30/2015  . Sick sinus syndrome ([place]) 10/30/2015  . Malignant neoplasm of overlapping sites of bladder ([place]) 09/26/2015  . Urinary retention 09/17/2015  . Bladder neoplasm of uncertain malignant potential 08/13/2015  . Thrombocytopenia, unspecified ([place]) 07/24/2015  . Fitting or adjustment of cardiac pacemaker 05/09/2015  . SVT (supraventricular tachycardia) ([place]) 04/09/2015  . Diabetes mellitus, type 2 ([place]) 04/09/2015  . Benign hypertension 04/09/2015  . Acute myocardial infarction, initial episode of care ([place]) 04/09/2015  . Phimosis 01/30/2014  . Bladder outlet obstruction 04/18/2013  . Dysuria 04/18/2013  . Gross hematuria 04/18/2013  . Urethral valve, acquired 04/18/2013  . Incomplete emptying of bladder 11/26/2012  . Personal history of prostate cancer 11/26/2012  Palliative Care Assessment & Plan   Patient Profile: Palliative Care consult requested for this 85 y.o.  male with multiple medical problems including hyperlipidemia, hypertension, high-grade bladder cancer (incompletely resected tumor), prostate cancer H/P radiation therapy, Bilateral hydronephrosis, type 2 diabetes, abdominal aortic aneurysm, arrhythmias, pacemaker, kidney disease stage IV, and fibrillation.  Patient presented with shortness of breath and cough.  ED work-up identified findings consistent with acute diastolic CHF in the setting of chronic bilateral hydronephrosis.  Recommendations/Plan:  Full Code  Continue with current care plan  Family considering outpatient palliative. Goals are set and clear, full code, full aggressive interventions.   PMT will continue to support and follow as needed. Please call.   Goals of Care and Additional Recommendations:  Limitations on Scope of Treatment: Full Scope Treatment  Code Status:    Code Status Orders  (From admission, onward)         Start     Ordered   12/25/18 1234  Full code  Continuous     12/25/18 1233        Code Status History    Date Active Date Inactive Code Status Order ID Comments User Context   12/25/2018 0645 12/25/2018 1233 Full Code [id number]  Paulette Blanch, MD ED   11/21/2018 2217 11/29/2018 2052 Full Code [id number]  Mayer Camel, NP ED   11/05/2018 1755 11/09/2018 1728 Full Code [id number]  Mayo, Pete Pelt, MD Inpatient   09/10/2017 1430 09/13/2017 2115 Full Code [id number]  Idelle Crouch, MD Inpatient   06/10/2017 2304 06/12/2017 1908 Full Code [id number]  Lance Coon, MD Inpatient   05/17/2017 0829 05/19/2017 1525 Full Code [id number]  Hillary Bow, MD ED   03/31/2017 1334 04/03/2017 1611 Full Code [id number]  Nicholes Mango, MD Inpatient   10/21/2016 0155 10/25/2016 2145 Full Code [id number]  Hugelmeyer, [place], DO Inpatient   10/09/2016 2342 10/13/2016 2009 Full Code [id number]  Vaughan Basta, MD Inpatient   04/14/2015 1432 04/15/2015 1739 Full Code [id number]  Isaias Cowman, MD Inpatient   04/09/2015 0022  04/14/2015 1432 Full Code [id number]  Hower, Aaron Mose, MD ED   Advance Care Planning Activity    Advance Directive Documentation     Most Recent Value  Type of Advance Directive  Healthcare Power of Attorney  Pre-existing out of facility DNR order (yellow form or pink MOST form)  -  "MOST" Form in Place?  -      Prognosis:   Guarded   Discharge Planning:  To Be Determined  Care plan was discussed with patient's son and bedside RN.   Thank you for allowing the Palliative Medicine Team to assist in the care of this patient.  Total Time: 15 min.    Greater than 50%  of this time was spent counseling and coordinating care related to the above assessment and plan.  Alda Lea, AGPCNP-BC Palliative Medicine Team  Phone: [phone] Pager: [phone] Amion: Bjorn Pippin    Please contact Palliative Medicine Team phone at [phone] for questions and concerns.

## 2018-12-29 NOTE — Progress Notes (Signed)
Cardiac Rehab Navigator/ Exercise Physiologist Note  This  EP rounded on patient Wednesday afternoon 12/27/18. Patient sitting in recliner and reported being sick all over. Patient was not in the mindset to receive education. This EP called patient's daughter. Patient discharge plans are to go home with daughter with home health. Patient's daughter was busy at the time. This EP called patient's daughter again today to provide HF education over the phone. Patient's daughter, who will be his caregiver, was appreciative of the information. Lazarus Salines [phone].  CHF Education:?? Educational session with patient completed.  Provided patient with "Living Better with Heart Failure" packet. Briefly reviewed definition of heart failure and signs and symptoms of an exacerbation. Discussed potential causes of CHF. Explained to patient that HF is a chronic illness which requires self-assessment / self-management along with help from the cardiologist/PCP. Discussed definition of EF measurement along with normal value compared to patient's EF 55-60 %. ? *Reviewed importance of and reason behind checking weight daily in the AM, after using the bathroom, but before getting dressed. Ms. Primus Bravo will weigh patient daily and report any weight gain to his Physician.  *Reviewed with patient the following information: *Discussed when to call the Dr= weight gain of >2-3lb overnight of 5lb in a week,  *Discussed yellow zone= call MD: weight gain of >2-3lb overnight of 5lb in a week, increased swelling, increased SOB when lying down, chest discomfort, dizziness, increased fatigue *Red Zone= call 911: struggle to breath, fainting or near fainting, significant chest pain ?  *Heart Failure Zone Magnet given and reviewed with patient.   ? *Diet - Patient currently ordered carb modified Referral for Dietitian Consultation for diet education has been ordered. Instructed patient to follow a low sodium diet of 2000 mg or less.   Recommended foods for low sodiumor heart failure carb modified nutrition therapy discussed. Reviewed with patient steps to reading a food label with close attention to serving size and mg of sodium.   ? *Discussed fluid intake with patient as well. Patient not currently on a fluid restriction, but advised no more than 64 ounces of fluid per day.?Demonstrated this volume to patient using the bedside water pitcher.   ? *Instructed patient to take medications as prescribed for heart failure. Explained briefly why patient is on the medications (either make you feel better, live longer or keep you out of the hospital) and discussed monitoring and side effects.  ? *Discussed exercise / activity. Patient is currently sedentary. During education with his daughter Ms. Primus Bravo this EP provided ways to help patient stay active even while seated when needed. Encouraged patient to be as active as possible. Patient is not a candidate for Cardiac or Pulmonary Rehab due to limited mobility and cognitive impairment. Patient will have home health and PT follow up.   *Smoking Cessation- Patient is FORMER smoker.   ? *[facility] - Explained the purpose of the HF Clinic. Explained to patient the HF Clinic does not replace PCP nor Cardiologist, but is an additional resource to helping patient manage heart failure at home. Patient's appointment in the [place] [facility] [facility] is scheduled for 07/16 at 1 pm. ? Again, the 5 Steps to Living Better with Heart Failure were reviewed with patient's daughter, Lazarus Salines by phone.  Caregiver of Patient thanked me for providing the above information. ?  Jasper Loser, [place] [facility]  Exercise Physiologist Department Phone #: [phone] Fax: [phone]  Direct Line [phone] Email Address:  [email or web address]

## 2018-12-29 NOTE — Procedures (Signed)
Interventional Radiology Procedure Note  Procedure: Image guided right PCN, 18F drain. .  Complications: None Recommendations:  - To gravity.  - Do not submerge - Routine care   Signed,  Dulcy Fanny. Earleen Newport, DO

## 2018-12-29 NOTE — Progress Notes (Signed)
   Date of Admission:  12/25/2018      Subjective: Stable no fever Medications:  . amLODipine  2.5 mg Oral Daily  . finasteride  5 mg Oral Daily  . furosemide  40 mg Intravenous Q12H  . heparin injection (subcutaneous)  5,000 Units Subcutaneous Q8H  . insulin aspart  0-5 Units Subcutaneous QHS  . insulin aspart  0-9 Units Subcutaneous TID WC  . metoprolol tartrate  50 mg Oral BID  . nitroGLYCERIN  0.5 inch Topical Q6H  . pantoprazole  40 mg Oral Daily  . potassium chloride  20 mEq Oral Once  . rosuvastatin  5 mg Oral QHS  . sodium chloride flush  3 mL Intravenous Q12H  . tamsulosin  0.4 mg Oral Daily    Objective: Vital signs in last 24 hours: Temp:  [97.6 F (36.4 C)-98.9 F (37.2 C)] 98.5 F (36.9 C) (06/19 0750) Pulse Rate:  [72-118] 72 (06/19 0750) Resp:  [18] 18 (06/18 2328) BP: (107-151)/(76-97) 107/76 (06/19 0750) SpO2:  [95 %-100 %] 95 % (06/19 0750)  PHYSICAL EXAM:  General: Alert, cooperative, no distress, appears stated age.  Left nephrostomy Foley  Lab Results Recent Labs    12/28/18 0337 12/29/18 0112  WBC 6.7 8.4  HGB 8.1* 8.2*  HCT 26.5* 26.7*  NA 144 142  K 3.8 3.4*  CL 106 102  CO2 26 26  BUN 69* 69*  CREATININE 5.28* 5.19*   Liver Panel Recent Labs    12/28/18 0337  ALBUMIN 2.6*    Assessment/Plan: ?Pseudomonas bacteremia secondary to pseudomonas complicated UTI- on cefepime - repeat blood culture sent   B/l hydroureteronephrosis secondary to out flow obstruction/scrring/fibroiss and bladder ca- has foley since 3 weeks but no change- has left nephrostomy and going for rt nephrosotmy  AKI on CKD - secondary to the above  Bladder ca  CHF On frusemide  Pacemaker  ? H/ prostate cancer- had surgery and radiation in the past  Spoke to his daughter regarding the plan for IV cefepime on discharge for 3 weeks IF blood culture negative tomorrow then PICC can be placed Discussed with Dr.Sainani

## 2018-12-29 NOTE — Treatment Plan (Signed)
Diagnosis: Pseudomonas bacteremia Baseline Creatinine  5.19   Allergies  Allergen Reactions  . Diovan [Valsartan] Cough  . Gabapentin Other (See Comments)    Sedation at all doses  . Hydralazine Itching  . Lisinopril Cough  . Pregabalin Other (See Comments)    Sedation at all doses  . Levofloxacin Other (See Comments)    Too many side effects. Nausea, vomiting, upset stomach, increased confusion, etc Other reaction(s): Confusion Nausea, chills Nausea, chills   . Sulfamethoxazole-Trimethoprim Other (See Comments)    Too many side effects. Nausea, vomiting, upset stomach, increased confusion, etc    OPAT Orders Discharge antibiotics: Cefepime 1 gram IV every 24 hours ( dose will be adjusted according to crcl) until 01/19/19 [facility] Care Per Protocol:  Labs weekly while on IV antibiotics: _X_ CBC with differential __X_ CMP  _X_ Please pull PIC at completion of IV antibiotics  Fax weekly labs to [phone]  Clinic Follow Up Appt:2 weeks   Call [phone] to make appt

## 2018-12-29 NOTE — Progress Notes (Signed)
[place] at [place] NAME: Blake [name]    MR#:  [id number]  DATE OF BIRTH:  03/07/1923  SUBJECTIVE:   No acute events overnight.  Renal function about the same.   Discussed with urology and ID and plan for right-sided nephrostomy tube placement.  Patient also needs a PICC line but repeat blood cultures were just drawn today.  Plan for PICC line over the weekend.  REVIEW OF SYSTEMS:    Review of Systems  Constitutional: Negative for chills and fever.  HENT: Negative for congestion and tinnitus.   Eyes: Negative for blurred vision and double vision.  Respiratory: Negative for cough, shortness of breath and wheezing.   Cardiovascular: Negative for chest pain, orthopnea and PND.  Gastrointestinal: Negative for abdominal pain, diarrhea, nausea and vomiting.  Genitourinary: Positive for hematuria. Negative for dysuria.  Neurological: Negative for dizziness, sensory change and focal weakness.  All other systems reviewed and are negative.   Nutrition: Heart Healthy Tolerating Diet: Yes Tolerating PT:  Await Eval.      DRUG ALLERGIES:   Allergies  Allergen Reactions  . Diovan [Valsartan] Cough  . Gabapentin Other (See Comments)    Sedation at all doses  . Hydralazine Itching  . Lisinopril Cough  . Pregabalin Other (See Comments)    Sedation at all doses  . Levofloxacin Other (See Comments)    Too many side effects. Nausea, vomiting, upset stomach, increased confusion, etc Other reaction(s): Confusion Nausea, chills Nausea, chills   . Sulfamethoxazole-Trimethoprim Other (See Comments)    Too many side effects. Nausea, vomiting, upset stomach, increased confusion, etc    VITALS:  Blood pressure 107/76, pulse 72, temperature 98.5 F (36.9 C), temperature source Oral, resp. rate 18, height 6\' 2"  (1.88 m), weight 81.6 kg, SpO2 95 %.  PHYSICAL EXAMINATION:   Physical Exam  GENERAL:  85 y.o.-year-old patient lying in bed in  no acute distress.  EYES: Pupils equal, round, reactive to light and accommodation. No scleral icterus. Extraocular muscles intact.  HEENT: Head atraumatic, normocephalic. Oropharynx and nasopharynx clear.  NECK:  Supple, no jugular venous distention. No thyroid enlargement, no tenderness.  LUNGS: Normal breath sounds bilaterally, no wheezing, rales, rhonchi. No use of accessory muscles of respiration.  CARDIOVASCULAR: S1, S2 normal. No murmurs, rubs, or gallops.  ABDOMEN: Soft, nontender, nondistended. Bowel sounds present. No organomegaly or mass.  EXTREMITIES: No cyanosis, clubbing or edema b/l.    NEUROLOGIC: Cranial nerves II through XII are intact. No focal Motor or sensory deficits b/l. Globally weak.   PSYCHIATRIC: The patient is alert and oriented x 3.  SKIN: No obvious rash, lesion, or ulcer.   + Foley cath in place with yellow urine draining.  Left sided Nephrostomy tube in place with some blood tinged urine noted.    LABORATORY PANEL:   CBC Recent Labs  Lab 12/29/18 0112  WBC 8.4  HGB 8.2*  HCT 26.7*  PLT 134*   ------------------------------------------------------------------------------------------------------------------  Chemistries  Recent Labs  Lab 12/25/18 0615  12/29/18 0112  NA 139   < > 142  K 5.1   < > 3.4*  CL 102   < > 102  CO2 23   < > 26  GLUCOSE 151*   < > 143*  BUN 70*   < > 69*  CREATININE 5.21*   < > 5.19*  CALCIUM 8.2*   < > 8.2*  AST 16  --   --   ALT 18  --   --  ALKPHOS 67  --   --   BILITOT 0.5  --   --    < > = values in this interval not displayed.   ------------------------------------------------------------------------------------------------------------------  Cardiac Enzymes Recent Labs  Lab 12/25/18 0615  TROPONINI 0.06*   ------------------------------------------------------------------------------------------------------------------  RADIOLOGY:  No results found.   ASSESSMENT AND PLAN:   85 year old male  with past medical history of abdominal aortic aneurysm, hypertension, hyperlipidemia, history of prostate cancer, diabetes who presented to the hospital due to bilateral hydroureteronephrosis, increasing shortness of breath and noted to be in acute renal failure.  1.  Acute on chronic diastolic CHF-this is the source of patient's worsening shortness of breath. - Improving with IV diuresis and will continue to monitor. - about 6 L (-) since admission and responding to diuresis,  -  Continue Norvasc, metoprolol.  2.  Acute renal failure- secondary to obstruction from bilateral hydroureteronephrosis. -Patient is status post percutaneous nephrostomy tube on the left and possible plan for intervention on the right to be arranged with Urology/IR.  -Renal function at baseline is around 2.3 and currently elevated over 5.  Nephrology following.  Continue sodium bicarbonate, no acute indication for hemodialysis presently.  3.  Pseudomonas bacteremia- secondary to UTI. - Continue IV cefepime.   -Discussed with infectious disease and repeat blood cultures drawn today.  Patient will likely need long-term IV antibiotics.  Plan for possible PICC line placement over the weekend.  4. Essential Hypertension - cont. Norvasc, Metoprolol.   5. DM Type II - cont. SSI and follow BS.   6. GERD - cont. Protonix.   7. Hyperlipidemia - cont. Crestor.   8. BPH - cont. Flomax, Finasteride.  - s/p IR guided Perc. Nephrostomy tube on left and plan for intervention on right.  - Urology following and discussed plan with them.   Called patient's daughter and updated her over the phone today.  All the records are reviewed and case discussed with Care Management/Social Worker. Management plans discussed with the patient, family and they are in agreement.  CODE STATUS: Full code  DVT Prophylaxis: Hep. SQ  TOTAL TIME TAKING CARE OF THIS PATIENT: 40 minutes.   POSSIBLE D/C IN 2-3 DAYS, DEPENDING ON CLINICAL  CONDITION.   Henreitta Leber M.D on 12/29/2018 at 3:07 PM  Between 7am to 6pm - Pager - [phone]  After 6pm go to [email or web address] - Proofreader  [facility]  Office  [phone]  CC: Primary care physician; Adin Hector, MD

## 2018-12-30 ENCOUNTER — Inpatient Hospital Stay: Payer: Self-pay

## 2018-12-30 LAB — CBC
HCT: 29.6 % — ABNORMAL LOW (ref 39.0–52.0)
Hemoglobin: 9 g/dL — ABNORMAL LOW (ref 13.0–17.0)
MCH: 27 pg (ref 26.0–34.0)
MCHC: 30.4 g/dL (ref 30.0–36.0)
MCV: 88.9 fL (ref 80.0–100.0)
Platelets: 125 10*3/uL — ABNORMAL LOW (ref 150–400)
RBC: 3.33 MIL/uL — ABNORMAL LOW (ref 4.22–5.81)
RDW: 16.1 % — ABNORMAL HIGH (ref 11.5–15.5)
WBC: 10 10*3/uL (ref 4.0–10.5)
nRBC: 0 % (ref 0.0–0.2)

## 2018-12-30 LAB — BASIC METABOLIC PANEL
Anion gap: 15 (ref 5–15)
BUN: 72 mg/dL — ABNORMAL HIGH (ref 8–23)
CO2: 26 mmol/L (ref 22–32)
Calcium: 8.8 mg/dL — ABNORMAL LOW (ref 8.9–10.3)
Chloride: 105 mmol/L (ref 98–111)
Creatinine, Ser: 5.07 mg/dL — ABNORMAL HIGH (ref 0.61–1.24)
GFR calc Af Amer: 10 mL/min — ABNORMAL LOW (ref >60)
GFR calc non Af Amer: 9 mL/min — ABNORMAL LOW (ref >60)
Glucose, Bld: 149 mg/dL — ABNORMAL HIGH (ref 70–99)
Potassium: 3.8 mmol/L (ref 3.5–5.1)
Sodium: 146 mmol/L — ABNORMAL HIGH (ref 135–145)

## 2018-12-30 LAB — GLUCOSE, CAPILLARY
Glucose-Capillary: 133 mg/dL — ABNORMAL HIGH (ref 70–99)
Glucose-Capillary: 179 mg/dL — ABNORMAL HIGH (ref 70–99)
Glucose-Capillary: 214 mg/dL — ABNORMAL HIGH (ref 70–99)

## 2018-12-30 LAB — CULTURE, BLOOD (ROUTINE X 2)
Culture: NO GROWTH
Special Requests: ADEQUATE

## 2018-12-30 MED ORDER — SODIUM CHLORIDE 0.9% FLUSH
10.0000 mL | INTRAVENOUS | Status: DC | PRN
  Administered 2018-12-31: 11:00:00 10 mL
  Filled 2018-12-30: qty 40

## 2018-12-30 MED ORDER — SODIUM CHLORIDE 0.45 % IV SOLN
INTRAVENOUS | Status: AC
  Administered 2018-12-30: 13:00:00 via INTRAVENOUS

## 2018-12-30 MED ORDER — SODIUM CHLORIDE 0.9% FLUSH
10.0000 mL | Freq: Two times a day (BID) | INTRAVENOUS | Status: DC
  Administered 2018-12-31: 20 mL
  Administered 2019-01-01 – 2019-01-02 (×3): 10 mL

## 2018-12-30 NOTE — Progress Notes (Signed)
[place] at [place] NAME: Blake [name]    MR#:  [id number]  DATE OF BIRTH:  08-Feb-1923  SUBJECTIVE:   No acute events overnight.  Renal function about the same.   Patient also needs a PICC line today   repeat blood cultures so far negative  alert and oriented  REVIEW OF SYSTEMS:    Review of Systems  Constitutional: Negative for chills and fever.  HENT: Negative for congestion and tinnitus.   Eyes: Negative for blurred vision and double vision.  Respiratory: Negative for cough, shortness of breath and wheezing.   Cardiovascular: Negative for chest pain, orthopnea and PND.  Gastrointestinal: Negative for abdominal pain, diarrhea, nausea and vomiting.  Genitourinary: Positive for hematuria. Negative for dysuria.  Neurological: Negative for dizziness, sensory change and focal weakness.  All other systems reviewed and are negative.   Nutrition: Heart Healthy Tolerating Diet: Yes Tolerating PT:  Await Eval.      DRUG ALLERGIES:   Allergies  Allergen Reactions  . Diovan [Valsartan] Cough  . Gabapentin Other (See Comments)    Sedation at all doses  . Hydralazine Itching  . Lisinopril Cough  . Pregabalin Other (See Comments)    Sedation at all doses  . Levofloxacin Other (See Comments)    Too many side effects. Nausea, vomiting, upset stomach, increased confusion, etc Other reaction(s): Confusion Nausea, chills Nausea, chills   . Sulfamethoxazole-Trimethoprim Other (See Comments)    Too many side effects. Nausea, vomiting, upset stomach, increased confusion, etc    VITALS:  Blood pressure (!) 146/71, pulse 64, temperature 98.3 F (36.8 C), temperature source Oral, resp. rate (!) 27, height 6\' 2"  (1.88 m), weight 81.6 kg, SpO2 97 %.  PHYSICAL EXAMINATION:   Physical Exam  GENERAL:  85 y.o.-year-old patient lying in bed in no acute distress.  EYES: Pupils equal, round, reactive to light and accommodation. No scleral  icterus. Extraocular muscles intact.  HEENT: Head atraumatic, normocephalic. Oropharynx and nasopharynx clear.  NECK:  Supple, no jugular venous distention. No thyroid enlargement, no tenderness.  LUNGS: Normal breath sounds bilaterally, no wheezing, rales, rhonchi. No use of accessory muscles of respiration.  CARDIOVASCULAR: S1, S2 normal. No murmurs, rubs, or gallops.  ABDOMEN: Soft, nontender, nondistended. Bowel sounds present. No organomegaly or mass. Bilateral nephrectomy tubes present draining urine. Foley + EXTREMITIES: No cyanosis, clubbing or edema b/l.    NEUROLOGIC: Cranial nerves II through XII are intact. No focal Motor or sensory deficits b/l. Globally weak.   PSYCHIATRIC:  patient is alert and oriented x 2 SKIN: No obvious rash, lesion, or ulcer.    LABORATORY PANEL:   CBC Recent Labs  Lab 12/30/18 0655  WBC 10.0  HGB 9.0*  HCT 29.6*  PLT 125*   ------------------------------------------------------------------------------------------------------------------  Chemistries  Recent Labs  Lab 12/25/18 0615  12/30/18 0655  NA 139   < > 146*  K 5.1   < > 3.8  CL 102   < > 105  CO2 23   < > 26  GLUCOSE 151*   < > 149*  BUN 70*   < > 72*  CREATININE 5.21*   < > 5.07*  CALCIUM 8.2*   < > 8.8*  AST 16  --   --   ALT 18  --   --   ALKPHOS 67  --   --   BILITOT 0.5  --   --    < > = values in this  interval not displayed.   ------------------------------------------------------------------------------------------------------------------  Cardiac Enzymes Recent Labs  Lab 12/25/18 0615  TROPONINI 0.06*   ------------------------------------------------------------------------------------------------------------------  RADIOLOGY:  [place] Intraoperative  Result Date: 12/29/2018 CLINICAL DATA:  Ultrasound was provided for use by the ordering physician, and a technical charge was applied by the performing facility.  No radiologist interpretation/professional services  rendered.   [place] Ekg Site Rite  Result Date: 12/30/2018 If [facility] image not attached, placement could not be confirmed due to current cardiac rhythm.    ASSESSMENT AND PLAN:   85 year old male with past medical history of abdominal aortic aneurysm, hypertension, hyperlipidemia, history of prostate cancer, diabetes who presented to the hospital due to bilateral hydroureteronephrosis, increasing shortness of breath and noted to be in acute renal failure.  1.  Acute on chronic diastolic CHF-this is the source of patient's worsening shortness of breath. - Improving with IV diuresis--d/c lasix--appears dry - about 7 L (-) since admission and responding to diuresis -  Continue Norvasc, metoprolol.  2. Acute renal failure- secondary to obstruction from bilateral hydroureteronephrosis with h/o Bladder cancer -Patient is status post percutaneous bilateral nephrostomy tube -Renal function at baseline is around 2.3 and currently elevated over 5.  -Nephrology following.  Continue sodium bicarbonate, no acute indication for hemodialysis presently. -?IVF today--appears dry  3.  Pseudomonas bacteremia- secondary to UTI. - Continue IV cefepime till July 10th -Discussed with infectious disease and repeat blood cultures negatvie so far Patient will likely need long-term IV antibiotics.  Plan [place] line placement today if able to.  4. Essential Hypertension - cont. Norvasc, Metoprolol.   5. DM Type II - cont. SSI and follow BS.   6. GERD - cont. Protonix.   7. Hyperlipidemia - cont. Crestor.   8. BPH - cont. Flomax, Finasteride.  - s/p IR guided Perc. Bilateral Nephrostomy tube on- Urology following and discussed plan with them.   Called patient's daughter Dr Primus Bravo and updated her over the phone today.  PT to see patient. He walks with a walker at baseline. Building services engineer.  Patient will discharge on Monday with home health PT and RN for IV antibiotic   CODE STATUS: Full code  DVT  Prophylaxis: Hep. SQ  TOTAL TIME TAKING CARE OF THIS PATIENT: 40 minutes.   POSSIBLE D/C IN 2 DAYS, DEPENDING ON CLINICAL CONDITION.   Fritzi Mandes M.D on 12/30/2018 at 9:47 AM  Between 7am to 6pm - Pager - [phone]  After 6pm go to [email or web address] - Proofreader  [facility]  Office  [phone]  CC: Primary care physician; Adin Hector, MD

## 2018-12-30 NOTE — Progress Notes (Addendum)
Physical Therapy Treatment Patient Details Name: DREYDEN ROHRMAN MRN: [id number] DOB: [birth year] Today's Date: 12/30/2018    History of Present Illness Blake [name]  is a 85 y.o. male with a known history of diabetes type 2, chronic kidney disease, triple AAA, hyperlipidemia, hypertension, prostate cancer who was recently hospitalized with similar presentation.  Who is presenting with shortness of breath , cough, and weakness.  Patient has bilateral hydronephrosis and has been followed by urology.  Plan was for him to have nephrostomy tubes placed.  However according to urology notes they were waiting so the family can be with him during procedure.  Patient states that his been having shortness of breath and cough for the past few days.  In the ER he was evaluated and noticed to have findings consistent with likely CHF. Pt is now admitted for SOB secondary to suspected acute CHF. Pt also with bilateral hydronephrosis and is now s/p L nephrostomy tube placement.     PT Comments    Pt to edge of bed with min a x 1.  Steady once sitting.  Stood with min a x 1 to walker and was able to transfer to recliner with min a x 1.  Once rested, he stood and walked to doorway with walker and min a x 1 before fatigued and requesting to sit.  Generally unsteady with gait but no major LOB's or buckling noted.  Requires verbal cues for hand placements with transitions.  SNF is appropriate for discharge but after discussion with MD, pt has assist at home from family and caregivers.  Pt with need +1 assist for all mobility at this time.  If home is chosen by family HHPT would be appropriate to follow upon discharge.   Follow Up Recommendations  SNF;Other (comment);Home health PT     Equipment Recommendations  Rolling walker with 5" wheels;Other (comment)    Recommendations for Other Services       Precautions / Restrictions Precautions Precautions: Fall Restrictions Weight Bearing Restrictions: No     Mobility  Bed Mobility Overal bed mobility: Needs Assistance Bed Mobility: Supine to Sit     Supine to sit: Min assist        Transfers   Equipment used: Rolling walker (2 wheeled) Transfers: Sit to/from Stand Sit to Stand: Min assist         General transfer comment: verbal cues for hand placements  Ambulation/Gait Ambulation/Gait assistance: Min assist Gait Distance (Feet): 15 Feet Assistive device: Rolling walker (2 wheeled) Gait Pattern/deviations: Step-through pattern;Decreased step length - right;Decreased step length - left;Trunk flexed     General Gait Details: to door of room limited by fatigue.  recliener follow for Barrister's clerk    Modified Rankin (Stroke Patients Only)       Balance Overall balance assessment: Needs assistance Sitting-balance support: No upper extremity supported;Feet supported Sitting balance-Leahy Scale: Good     Standing balance support: Bilateral upper extremity supported Standing balance-Leahy Scale: Poor Standing balance comment: Continual minA+1 to maintain balance                            Cognition Arousal/Alertness: Awake/alert Behavior During Therapy: WFL for tasks assessed/performed Overall Cognitive Status: Within Functional Limits for tasks assessed  Exercises      General Comments        Pertinent Vitals/Pain Pain Assessment: No/denies pain    Home Living                      Prior Function            PT Goals (current goals can now be found in the care plan section) Progress towards PT goals: Progressing toward goals    Frequency           PT Plan Current plan remains appropriate;Other (comment)    Co-evaluation              AM-PAC PT "6 Clicks" Mobility   Outcome Measure  Help needed turning from your back to your side while in a flat bed without using  bedrails?: A Little Help needed moving from lying on your back to sitting on the side of a flat bed without using bedrails?: A Little Help needed moving to and from a bed to a chair (including a wheelchair)?: A Little Help needed standing up from a chair using your arms (e.g., wheelchair or bedside chair)?: A Little Help needed to walk in hospital room?: A Little Help needed climbing 3-5 steps with a railing? : A Lot 6 Click Score: 17    End of Session Equipment Utilized During Treatment: Gait belt Activity Tolerance: Patient tolerated treatment well Patient left: in chair;with call bell/phone within reach;with chair alarm set Nurse Communication: Mobility status       Time: 1113-1130 PT Time Calculation (min) (ACUTE ONLY): 17 min  Charges:  $Gait Training: 8-22 mins                    Chesley Noon, PTA 12/30/18, 1:20 PM

## 2018-12-30 NOTE — Progress Notes (Signed)
Peripherally Inserted Central Catheter/Midline Placement  The IV Nurse has discussed with the patient and/or persons authorized to consent for the patient, the purpose of this procedure and the potential benefits and risks involved with this procedure.  The benefits include less needle sticks, lab draws from the catheter, and the patient may be discharged home with the catheter. Risks include, but not limited to, infection, bleeding, blood clot (thrombus formation), and puncture of an artery; nerve damage and irregular heartbeat and possibility to perform a PICC exchange if needed/ordered by physician.  Alternatives to this procedure were also discussed.  Bard Power PICC patient education guide, fact sheet on infection prevention and patient information card has been provided to patient /or left at bedside.    PICC/Midline Placement Documentation  PICC Single Lumen 12/30/18 PICC Right Brachial 42 cm 3 cm (Active)  Indication for Insertion or Continuance of Line Home intravenous therapies (PICC only) 12/30/18 1700  Exposed Catheter (cm) 3 cm 12/30/18 1700  Site Assessment Clean;Dry;Intact 12/30/18 1700  Line Status Flushed;Blood return noted;Saline locked 12/30/18 1700  Dressing Type Transparent 12/30/18 1700  Dressing Status Clean;Dry;Intact;Antimicrobial disc in place 12/30/18 [place] checked and tightened 12/30/18 1700  Dressing Change Due 01/06/19 12/30/18 1700       Lorenza Cambridge 12/30/2018, 5:53 PM

## 2018-12-30 NOTE — Plan of Care (Signed)
  Problem: Clinical Measurements: Goal: Respiratory complications will improve Outcome: Progressing   

## 2018-12-30 NOTE — Progress Notes (Signed)
[facility]  ROUNDING NOTE   Subjective:  Right nephrostomy tube placed  UOP 1375  Creatinine 5.07 (5.19)  Objective:  Vital signs in last 24 hours:  Temp:  [97.8 F (36.6 C)-99.6 F (37.6 C)] 98.3 F (36.8 C) (06/20 0807) Pulse Rate:  [53-140] 64 (06/20 0807) Resp:  [15-47] 27 (06/19 1648) BP: (103-146)/(61-101) 146/71 (06/20 0807) SpO2:  [93 %-100 %] 97 % (06/20 0807)  Weight change:  Filed Weights   12/25/18 0607  Weight: 81.6 kg    Intake/Output: I/O last 3 completed shifts: In: 100 [IV Piggyback:100] Out: 2425 [Urine:2425]   Intake/Output this shift:  Total I/O In: 480 [P.O.:480] Out: -   Physical Exam: General: NAD,   Head: Normocephalic, atraumatic. Moist oral mucosal membranes  Eyes: Anicteric, PERRL  Neck: Supple, trachea midline  Lungs:  Clear to auscultation  Heart: Regular rate and rhythm  Abdomen:  + tender to palpation  Extremities:  trace  peripheral edema.  Neurologic: Nonfocal, moving all four extremities  Skin: No lesions   GU Left percutaneous nephrostomy tube, indwelling foley catheter    Basic Metabolic Panel: Recent Labs  Lab 12/26/18 0255 12/27/18 0520 12/28/18 0337 12/29/18 0112 12/30/18 0655  NA 140 144 144 142 146*  K 4.8 4.0 3.8 3.4* 3.8  CL 105 106 106 102 105  CO2 20* 24 26 26 26   GLUCOSE 141* 150* 158* 143* 149*  BUN 75* 75* 69* 69* 72*  CREATININE 5.66* 5.39* 5.28* 5.19* 5.07*  CALCIUM 8.0* 8.4* 8.4* 8.2* 8.8*  PHOS  --   --  5.2*  --   --     Liver Function Tests: Recent Labs  Lab 12/25/18 0615 12/28/18 0337  AST 16  --   ALT 18  --   ALKPHOS 67  --   BILITOT 0.5  --   PROT 7.8  --   ALBUMIN 3.0* 2.6*   No results for input(s): LIPASE, AMYLASE in the last 168 hours. No results for input(s): AMMONIA in the last 168 hours.  CBC: Recent Labs  Lab 12/25/18 0615 12/26/18 0255 12/27/18 0520 12/28/18 0337 12/29/18 0112 12/30/18 0655  WBC 8.5 9.2 7.0 6.7 8.4 10.0  NEUTROABS 6.9  --    --   --   --   --   HGB 8.3* 8.1* 8.0* 8.1* 8.2* 9.0*  HCT 27.8* 26.8* 26.3* 26.5* 26.7* 29.6*  MCV 88.3 89.0 86.8 86.9 86.4 88.9  PLT 128* 130* 131* 128* 134* 125*    Cardiac Enzymes: Recent Labs  Lab 12/25/18 0615  TROPONINI 0.06*    BNP: Invalid input(s): POCBNP  CBG: Recent Labs  Lab 12/28/18 2104 12/29/18 0752 12/29/18 1753 12/29/18 2050 12/30/18 0801  GLUCAP 142* 118* 138* 184* 133*    Microbiology: Results for orders placed or performed during the hospital encounter of 12/25/18  Culture, blood (routine x 2)     Status: None   Collection Time: 12/25/18  6:15 AM   Specimen: BLOOD  Result Value Ref Range Status   Specimen Description BLOOD LEFT ANTECUBITAL  Final   Special Requests   Final    BOTTLES DRAWN AEROBIC AND ANAEROBIC Blood Culture adequate volume   Culture   Final    NO GROWTH 5 DAYS Performed at [facility], [address] [place], [place] [address]    Report Status 12/30/2018 FINAL  Final  Culture, blood (routine x 2)     Status: Abnormal   Collection Time: 12/25/18  6:15 AM  Specimen: BLOOD  Result Value Ref Range Status   Specimen Description   Final    BLOOD RIGHT FOREARM Performed at [facility], [address] [place], [place] [address]    Special Requests   Final    BOTTLES DRAWN AEROBIC AND ANAEROBIC Blood Culture results may not be optimal due to an excessive volume of blood received in culture bottles Performed at [facility], [address], [place], [place] [address]    Culture  Setup Time   Final    GRAM NEGATIVE RODS AEROBIC BOTTLE ONLY CRITICAL RESULT CALLED TO, READ BACK BY AND VERIFIED WITH:  DAVID BESANTI PHARMD 0300 12/26/2018 HNM Performed at [facility], [place] [address], [place], [place] [address]    Culture PSEUDOMONAS AERUGINOSA (A)  Final   Report Status 12/28/2018 FINAL  Final   Organism ID, Bacteria PSEUDOMONAS AERUGINOSA  Final      Susceptibility   Pseudomonas  aeruginosa - MIC*    CEFTAZIDIME 4 SENSITIVE Sensitive     CIPROFLOXACIN 2 INTERMEDIATE Intermediate     GENTAMICIN <=1 SENSITIVE Sensitive     IMIPENEM 1 SENSITIVE Sensitive     PIP/TAZO 8 SENSITIVE Sensitive     CEFEPIME 2 SENSITIVE Sensitive     * PSEUDOMONAS AERUGINOSA  Urine culture     Status: Abnormal   Collection Time: 12/25/18  6:15 AM   Specimen: Urine, Random  Result Value Ref Range Status   Specimen Description   Final    URINE, RANDOM Performed at [facility], [address], [place], [place] [address]    Special Requests   Final    NONE Performed at [facility], [address] [place], [place] [address]    Culture >=100,000 COLONIES/mL PSEUDOMONAS AERUGINOSA (A)  Final   Report Status 12/28/2018 FINAL  Final   Organism ID, Bacteria PSEUDOMONAS AERUGINOSA (A)  Final      Susceptibility   Pseudomonas aeruginosa - MIC*    CEFTAZIDIME 4 SENSITIVE Sensitive     CIPROFLOXACIN 2 INTERMEDIATE Intermediate     GENTAMICIN <=1 SENSITIVE Sensitive     IMIPENEM 2 SENSITIVE Sensitive     PIP/TAZO 8 SENSITIVE Sensitive     CEFEPIME 2 SENSITIVE Sensitive     * >=100,000 COLONIES/mL PSEUDOMONAS AERUGINOSA  SARS Coronavirus 2 (CEPHEID- Performed in [facility] hospital lab), Hosp Order     Status: None   Collection Time: 12/25/18  6:15 AM   Specimen: Nasopharyngeal  Result Value Ref Range Status   SARS Coronavirus 2 NEGATIVE NEGATIVE Final    Comment: (NOTE) If result is NEGATIVE SARS-CoV-2 target nucleic acids are NOT DETECTED. The SARS-CoV-2 RNA is generally detectable in upper and lower  respiratory specimens during the acute phase of infection. The lowest  concentration of SARS-CoV-2 viral copies this assay can detect is 250  copies / mL. A negative result does not preclude SARS-CoV-2 infection  and should not be used as the sole basis for treatment or other  patient management decisions.  A negative result may occur with  improper specimen  collection / handling, submission of specimen other  than nasopharyngeal swab, presence of viral mutation(s) within the  areas targeted by this assay, and inadequate number of viral copies  (<250 copies / mL). A negative result must be combined with clinical  observations, patient history, and epidemiological information. If result is POSITIVE SARS-CoV-2 target nucleic acids are DETECTED. The SARS-CoV-2 RNA is generally detectable in upper and lower  respiratory  specimens dur ing the acute phase of infection.  Positive  results are indicative of active infection with SARS-CoV-2.  Clinical  correlation with patient history and other diagnostic information is  necessary to determine patient infection status.  Positive results do  not rule out bacterial infection or co-infection with other viruses. If result is PRESUMPTIVE POSTIVE SARS-CoV-2 nucleic acids MAY BE PRESENT.   A presumptive positive result was obtained on the submitted specimen  and confirmed on repeat testing.  While 2019 novel coronavirus  (SARS-CoV-2) nucleic acids may be present in the submitted sample  additional confirmatory testing may be necessary for epidemiological  and / or clinical management purposes  to differentiate between  SARS-CoV-2 and other Sarbecovirus currently known to infect humans.  If clinically indicated additional testing with an alternate test  methodology [phone]) is advised. The SARS-CoV-2 RNA is generally  detectable in upper and lower respiratory sp ecimens during the acute  phase of infection. The expected result is Negative. Fact Sheet for Patients:  StrictlyIdeas.no Fact Sheet for Healthcare Providers: BankingDealers.co.za This test is not yet approved or cleared by the [place] FDA and has been authorized for detection and/or diagnosis of SARS-CoV-2 by FDA under an Emergency Use Authorization (EUA).  This EUA will remain in effect  (meaning this test can be used) for the duration of the COVID-19 declaration under Section 564(b)(1) of the Act, 21 U.S.C. section 360bbb-3(b)(1), unless the authorization is terminated or revoked sooner. Performed at [facility], [address] [place], [place] [address]   Blood Culture ID Panel (Reflexed)     Status: Abnormal   Collection Time: 12/25/18  6:15 AM  Result Value Ref Range Status   Enterococcus species NOT DETECTED NOT DETECTED Final   Listeria monocytogenes NOT DETECTED NOT DETECTED Final   Staphylococcus species NOT DETECTED NOT DETECTED Final   Staphylococcus aureus (BCID) NOT DETECTED NOT DETECTED Final   Streptococcus species NOT DETECTED NOT DETECTED Final   Streptococcus agalactiae NOT DETECTED NOT DETECTED Final   Streptococcus pneumoniae NOT DETECTED NOT DETECTED Final   Streptococcus pyogenes NOT DETECTED NOT DETECTED Final   Acinetobacter baumannii NOT DETECTED NOT DETECTED Final   Enterobacteriaceae species NOT DETECTED NOT DETECTED Final   Enterobacter cloacae complex NOT DETECTED NOT DETECTED Final   Escherichia coli NOT DETECTED NOT DETECTED Final   Klebsiella oxytoca NOT DETECTED NOT DETECTED Final   Klebsiella pneumoniae NOT DETECTED NOT DETECTED Final   Proteus species NOT DETECTED NOT DETECTED Final   Serratia marcescens NOT DETECTED NOT DETECTED Final   Carbapenem resistance NOT DETECTED NOT DETECTED Final   Haemophilus influenzae NOT DETECTED NOT DETECTED Final   Neisseria meningitidis NOT DETECTED NOT DETECTED Final   Pseudomonas aeruginosa DETECTED (A) NOT DETECTED Final    Comment: CRITICAL RESULT CALLED TO, READ BACK BY AND VERIFIED WITH: DAVID BESANTI PHARMD 0300 12/26/2018 HNM    Candida albicans NOT DETECTED NOT DETECTED Final   Candida glabrata NOT DETECTED NOT DETECTED Final   Candida krusei NOT DETECTED NOT DETECTED Final   Candida parapsilosis NOT DETECTED NOT DETECTED Final   Candida tropicalis NOT DETECTED NOT DETECTED  Final    Comment: Performed at [facility], [address] [place], [place] [address]  CULTURE, BLOOD (ROUTINE X 2) w Reflex to ID Panel     Status: None (Preliminary result)   Collection Time: 12/29/18  1:12 AM   Specimen: BLOOD  Result Value Ref Range Status   Specimen Description BLOOD LEFT ANTECUBITAL  Final   Special Requests   Final    BOTTLES DRAWN AEROBIC AND ANAEROBIC Blood Culture adequate volume   Culture   Final    NO GROWTH 1 DAY Performed at [facility], [address] [place], [place] [address]    Report Status PENDING  Incomplete  CULTURE, BLOOD (ROUTINE X 2) w Reflex to ID Panel     Status: None (Preliminary result)   Collection Time: 12/29/18  1:17 AM   Specimen: BLOOD  Result Value Ref Range Status   Specimen Description BLOOD RIGHT ANTECUBITAL  Final   Special Requests   Final    BOTTLES DRAWN AEROBIC AND ANAEROBIC Blood Culture adequate volume   Culture   Final    NO GROWTH 1 DAY Performed at [facility], [address] [place], [place] [address]    Report Status PENDING  Incomplete    Coagulation Studies: No results for input(s): LABPROT, INR in the last 72 hours.  Urinalysis: No results for input(s): COLORURINE, LABSPEC, PHURINE, GLUCOSEU, HGBUR, BILIRUBINUR, KETONESUR, PROTEINUR, UROBILINOGEN, NITRITE, LEUKOCYTESUR in the last 72 hours.  Invalid input(s): APPERANCEUR    Imaging: [place] Intraoperative  Result Date: 12/29/2018 CLINICAL DATA:  Ultrasound was provided for use by the ordering physician, and a technical charge was applied by the performing facility.  No radiologist interpretation/professional services rendered.   [place] Ekg Site Rite  Result Date: 12/30/2018 If [facility] image not attached, placement could not be confirmed due to current cardiac rhythm.    Medications:   . sodium chloride    . sodium chloride 10 mL/hr at 12/30/18 1106  . ceFEPime (MAXIPIME) IV 1 g (12/30/18 1109)   . amLODipine  2.5  mg Oral Daily  . finasteride  5 mg Oral Daily  . furosemide  40 mg Intravenous Q12H  . heparin injection (subcutaneous)  5,000 Units Subcutaneous Q8H  . insulin aspart  0-5 Units Subcutaneous QHS  . insulin aspart  0-9 Units Subcutaneous TID WC  . metoprolol tartrate  50 mg Oral BID  . nitroGLYCERIN  0.5 inch Topical Q6H  . pantoprazole  40 mg Oral Daily  . rosuvastatin  5 mg Oral QHS  . sodium chloride flush  3 mL Intravenous Q12H  . tamsulosin  0.4 mg Oral Daily   sodium chloride, acetaminophen **OR** acetaminophen, ondansetron **OR** ondansetron (ZOFRAN) IV, sodium chloride flush  Assessment/ Plan:  Mr. LELON IKARD is a 85 y.o. black male with high-grade bladder cancer, incompletely resected tumor, severe bilateral hydronephrosis, history of prostate cancer, recurrent UTIs, atrial fibrillation, pacemaker/defibrillator, diabetes mellitus type II who is admitted to [facility] on 12/25/2018 for bilateral hydronephrosis  1. Acute renal failure with obstructive uropathy. With metabolic acidosis and hyponatremia.  On Chronic kidney disease stage IV.  Baseline creatinine 2.22, GFR of 28 09/13/2018 Status post left percutaneous nephrostomy tube placement by IR on 6/16. Left on 6/19.  - Appreciate urology input  2. Hypernatremia - hold furosemide - 1 Liter of 1/2NS infusion   3. Hypertension with diastolic congestive heart failure:  - amlodipine, metoprolol, and tamsulosin  4. Urinary tract infection: blood cultures with pseudomonas - cefepime.   5. Anemia of chronic kidney disease: hemoglobin 9   LOS: 5 Mckinzi Eriksen 6/20/202011:39 AM

## 2018-12-30 NOTE — TOC Progression Note (Signed)
Transition of Care [facility]) - Progression Note    Patient Details  Name: Blake [name] MRN: [id number] Date of Birth: 20-Oct-1922  Transition of Care [facility]) CM/SW Contact  Latanya Maudlin, RN Phone Number: 12/30/2018, 3:05 PM  Clinical Narrative:  Notified by MD that patient is likely to leave on IV abx. [facility] with [facility] infusion following. PICC being placed. OPAT orders in. Followed for [facility] by [facility]. Corene Cornea is following.    Expected Discharge Plan: [place]    Expected Discharge Plan and Services Expected Discharge Plan: [place] In-house Referral: Clinical Social Work Discharge Planning Services: CM Consult Post Acute Care Choice: [place] arrangements for the past 2 months: [place]: RN, PT, Nurse's Aide, Social Work [employer] Agency: Microbiologist ([place]) Date [place]: 12/25/18 Time [place]: 1000 Representative spoke with at [place]: [place] (SDOH) Interventions    Readmission Risk Interventions Readmission Risk Prevention Plan 12/30/2018 12/25/2018 11/24/2018  Transportation Screening Complete Complete Complete  PCP or Specialist Appt within 3-5 Days - - Complete  HRI or [place] - Complete Complete  Social Work Consult for [place] Planning/Counseling - Complete (No Data)  Palliative Care Screening - - Not Applicable  Medication Review Press photographer) Complete Complete Complete  HRI or Home Care Consult Complete - -  Some recent data might be hidden

## 2018-12-30 NOTE — Progress Notes (Signed)
Spoke with Dr. Juleen China concerning placement of PICC. He authorized placement and will move forward with placement.

## 2018-12-31 LAB — RENAL FUNCTION PANEL
Albumin: 2.9 g/dL — ABNORMAL LOW (ref 3.5–5.0)
Anion gap: 13 (ref 5–15)
BUN: 76 mg/dL — ABNORMAL HIGH (ref 8–23)
CO2: 27 mmol/L (ref 22–32)
Calcium: 8.8 mg/dL — ABNORMAL LOW (ref 8.9–10.3)
Chloride: 102 mmol/L (ref 98–111)
Creatinine, Ser: 4.98 mg/dL — ABNORMAL HIGH (ref 0.61–1.24)
GFR calc Af Amer: 11 mL/min — ABNORMAL LOW (ref >60)
GFR calc non Af Amer: 9 mL/min — ABNORMAL LOW (ref >60)
Glucose, Bld: 155 mg/dL — ABNORMAL HIGH (ref 70–99)
Phosphorus: 5 mg/dL — ABNORMAL HIGH (ref 2.5–4.6)
Potassium: 4.4 mmol/L (ref 3.5–5.1)
Sodium: 142 mmol/L (ref 135–145)

## 2018-12-31 LAB — GLUCOSE, CAPILLARY
Glucose-Capillary: 136 mg/dL — ABNORMAL HIGH (ref 70–99)
Glucose-Capillary: 132 mg/dL — ABNORMAL HIGH (ref 70–99)
Glucose-Capillary: 139 mg/dL — ABNORMAL HIGH (ref 70–99)
Glucose-Capillary: 140 mg/dL — ABNORMAL HIGH (ref 70–99)

## 2018-12-31 MED ORDER — GLUCERNA SHAKE PO LIQD
237.0000 mL | Freq: Two times a day (BID) | ORAL | Status: DC
  Administered 2019-01-01 – 2019-01-02 (×2): 237 mL via ORAL

## 2018-12-31 MED ORDER — ADULT MULTIVITAMIN W/MINERALS CH
1.0000 | ORAL_TABLET | Freq: Every day | ORAL | Status: DC
  Administered 2019-01-01 – 2019-01-02 (×2): 1 via ORAL
  Filled 2018-12-31 (×2): qty 1

## 2018-12-31 NOTE — Progress Notes (Signed)
[place] [facility] [place] NAME: Blake [name]    MR#:  [id number]  DATE OF BIRTH:  Dec 22, 1922  SUBJECTIVE:   No acute events overnight. No new complaints. No new issues per RN. Patient overall improving.  REVIEW OF SYSTEMS:    Review of Systems  Constitutional: Negative for chills and fever.  HENT: Negative for congestion and tinnitus.   Eyes: Negative for blurred vision and double vision.  Respiratory: Negative for cough, shortness of breath and wheezing.   Cardiovascular: Negative for chest pain, orthopnea and PND.  Gastrointestinal: Negative for abdominal pain, diarrhea, nausea and vomiting.  Genitourinary: Positive for hematuria. Negative for dysuria.  Neurological: Negative for dizziness, sensory change and focal weakness.  All other systems reviewed and are negative.   Nutrition: Heart Healthy Tolerating Diet: Yes Tolerating PT:  HHPT  DRUG ALLERGIES:   Allergies  Allergen Reactions  . Diovan [Valsartan] Cough  . Gabapentin Other (See Comments)    Sedation at all doses  . Hydralazine Itching  . Lisinopril Cough  . Pregabalin Other (See Comments)    Sedation at all doses  . Levofloxacin Other (See Comments)    Too many side effects. Nausea, vomiting, upset stomach, increased confusion, etc Other reaction(s): Confusion Nausea, chills Nausea, chills   . Sulfamethoxazole-Trimethoprim Other (See Comments)    Too many side effects. Nausea, vomiting, upset stomach, increased confusion, etc    VITALS:  Blood pressure 102/68, pulse 84, temperature 98.2 F (36.8 C), temperature source Oral, resp. rate 16, height 6\' 2"  (1.88 m), weight 81.6 kg, SpO2 99 %.  PHYSICAL EXAMINATION:   Physical Exam  GENERAL:  85 y.o.-year-old patient lying in bed in no acute distress.  EYES: Pupils equal, round, reactive to light and accommodation. No scleral icterus. Extraocular muscles intact.  HEENT: Head atraumatic, normocephalic. Oropharynx  and nasopharynx clear.  NECK:  Supple, no jugular venous distention. No thyroid enlargement, no tenderness.  LUNGS: Normal breath sounds bilaterally, no wheezing, rales, rhonchi. No use of accessory muscles of respiration.  CARDIOVASCULAR: S1, S2 normal. No murmurs, rubs, or gallops.  ABDOMEN: Soft, nontender, nondistended. Bowel sounds present. No organomegaly or mass. Bilateral nephrectomy tubes present draining urine. Foley + EXTREMITIES: No cyanosis, clubbing or edema b/l.    NEUROLOGIC: Cranial nerves II through XII are intact. No focal Motor or sensory deficits b/l. Globally weak.   PSYCHIATRIC:  patient is alert and oriented x 2 SKIN: No obvious rash, lesion, or ulcer.    LABORATORY PANEL:   CBC Recent Labs  Lab 12/30/18 0655  WBC 10.0  HGB 9.0*  HCT 29.6*  PLT 125*   ------------------------------------------------------------------------------------------------------------------  Chemistries  Recent Labs  Lab 12/25/18 0615  12/31/18 1009  NA 139   < > 142  K 5.1   < > 4.4  CL 102   < > 102  CO2 23   < > 27  GLUCOSE 151*   < > 155*  BUN 70*   < > 76*  CREATININE 5.21*   < > 4.98*  CALCIUM 8.2*   < > 8.8*  AST 16  --   --   ALT 18  --   --   ALKPHOS 67  --   --   BILITOT 0.5  --   --    < > = values in this interval not displayed.   ------------------------------------------------------------------------------------------------------------------  Cardiac Enzymes Recent Labs  Lab 12/25/18 0615  TROPONINI 0.06*   ------------------------------------------------------------------------------------------------------------------  RADIOLOGY:  [place] Intraoperative  Result Date: 12/29/2018 CLINICAL DATA:  Ultrasound was provided for use by the ordering physician, and a technical charge was applied by the performing facility.  No radiologist interpretation/professional services rendered.   [place] Ekg Site Rite  Result Date: 12/30/2018 If [facility] image not  attached, placement could not be confirmed due to current cardiac rhythm.    ASSESSMENT AND PLAN:   85 year old male with past medical history of abdominal aortic aneurysm, hypertension, hyperlipidemia, history of prostate cancer, diabetes who presented to the hospital due to bilateral hydroureteronephrosis, increasing shortness of breath and noted to be in acute renal failure.  1.  Acute on chronic diastolic CHF-this is the source of patient's worsening shortness of breath. - Improving with IV diuresis--d/c lasix--appears dry - about 10 L (-) since admission and responding to diuresis -  Continue Norvasc, metoprolol.  2. Acute renal failure- secondary to obstruction from bilateral hydroureteronephrosis with h/o Bladder cancer -Patient is status post percutaneous bilateral nephrostomy tube -Renal function at baseline is around 2.3 and currently elevated over 5.  -Nephrology following.  Continue sodium bicarbonate, no acute indication for hemodialysis presently. -?IVF today--appears dry  3.  Pseudomonas bacteremia- secondary to UTI. - Continue IV cefepime till July 10th -Discussed with infectious disease and repeat blood cultures negatvie so far  -s/p PICC line placement June 20th  4. Essential Hypertension - cont. Norvasc, Metoprolol.   5. DM Type II - cont. SSI and follow BS.   6. GERD - cont. Protonix.   7. Hyperlipidemia - cont. Crestor.   8. BPH - cont. Flomax, Finasteride.  - s/p IR guided Perc. Bilateral Nephrostomy tubes  Called patient's daughter Dr Primus Bravo and updated her over the phone today.  PT to see patient. He walks with a walker at baseline. Building services engineer.  Patient will discharge on Monday with home health PT and RN for IV antibiotic--dter agreeable  CODE STATUS: Full code  DVT Prophylaxis: Hep. SQ  TOTAL TIME TAKING CARE OF THIS PATIENT: 40 minutes.   POSSIBLE D/C IN  1 DAYS, DEPENDING ON CLINICAL CONDITION.   Fritzi Mandes M.D on 12/31/2018 at 1:09 PM   Between 7am to 6pm - Pager - [phone]  After 6pm go to [email or web address] - Proofreader  [facility] [place] [facility]  Office  [phone]  CC: Primary care physician; Adin Hector, MD

## 2018-12-31 NOTE — Plan of Care (Signed)
  Problem: Education: Goal: Knowledge of General Education information will improve Description: Including pain rating scale, medication(s)/side effects and non-pharmacologic comfort measures Outcome: Progressing   Problem: Health Behavior/Discharge Planning: Goal: Ability to manage health-related needs will improve Outcome: Progressing   Problem: Fluid Volume: Goal: Compliance with measures to maintain balanced fluid volume will improve Outcome: Progressing

## 2018-12-31 NOTE — Progress Notes (Signed)
Initial Nutrition Assessment  RD working remotely.  DOCUMENTATION CODES:   Not applicable  INTERVENTION:  Will downgrade diet to dysphagia 3 (mechanical soft) with thin liquids.  Provide Glucerna Shake po BID, each supplement provides 220 kcal and 10 grams of protein. Patient prefers chocolate.  Provide daily MVI.  Provide 1:1 assistance at meals.  NUTRITION DIAGNOSIS:   Inadequate oral intake related to decreased appetite, other (see comment)(difficulty chewing, difficulty feeding self) as evidenced by per patient/family report.  GOAL:   Patient will meet greater than or equal to 90% of their needs  MONITOR:   PO intake, Supplement acceptance, Labs, Weight trends, Skin, I & O's  REASON FOR ASSESSMENT:   Malnutrition Screening Tool    ASSESSMENT:   85 year old male with PMHx of HTN, DM, HLD, prostate cancer s/p surgery, AAA, hx pacemaker insertion admitted with bilateral hydroureteronephrosis s/p placement of percutaneous bilateral nephrostomy tubes, acute on chronic diastolic CHF, acute renal failure, pseudomonas bacteremia.   Spoke with patient's daughter Dr. Primus Bravo over the phone. She reports patient has had a decreased appetite for a little while PTA. She also reports that he is likely not eating well in the hospital because he has difficulty with chewing and needs assistance with eating at meals. She reports he needs a mechanical soft diet. He enjoys chocolate Glucerna and drinks them regularly at home. Daughter reports she was educated on low-sodium diet. He lives with her. She already limits his access to processed foods and he eats home-cooked meals made by her or aides. Discussed that when PO intake is poor, the main focus is on helping patient obtain adequate calories and protein. He is unlikely to go over sodium limit with poor PO intake.  Patient has been losing weight. He was 99.8 kg on 12/09/2017. He is now 81.6 kg (180 lbs) - though unsure if this is accurate. He  has lots 18.2 kg (18.2% body weight) over the past year, which is not quite significant for time but is still concerning.  Medications reviewed and include: Lasix 40 mg Q12hrs IV, Novolog 0-9 units TID, Novolog 0-5 units QHS, pantoprazole, Crestor, Flomax, cefepime.  Labs reviewed: CBG 132-214, BUN 76, Creatinine 4.98, Phosphorus 5.  Patient is at risk for malnutrition. Unable to confirm if patient is malnourished without completing NFPE.  NUTRITION - FOCUSED PHYSICAL EXAM:  Unable to complete at this time.  Diet Order:   Diet Order            Diet Heart Room service appropriate? Yes; Fluid consistency: Thin  Diet effective now             EDUCATION NEEDS:   No education needs have been identified at this time  Skin:  Skin Assessment: Reviewed RN Assessment  Last BM:  12/30/2018 - medium type 5  Height:   Ht Readings from Last 1 Encounters:  12/25/18 6\' 2"  (1.88 m)   Weight:   Wt Readings from Last 1 Encounters:  12/25/18 81.6 kg   Ideal Body Weight:  86.4 kg  BMI:  Body mass index is 23.11 kg/m.  Estimated Nutritional Needs:   Kcal:  1850-2050  Protein:  90-100 grams  Fluid:  1.8 L/day  Willey Blade, MS, RD, LDN Office: [phone] Pager: [phone] After Hours/Weekend Pager: [phone]

## 2018-12-31 NOTE — Progress Notes (Signed)
[facility]  ROUNDING NOTE   Subjective:   Patient is resting comfortable. Received 1/2NS 1 liter yesterday  UOP 850  Creatinine 4.98 (5.07) (5.19)  Objective:  Vital signs in last 24 hours:  Temp:  [97.9 F (36.6 C)-98.7 F (37.1 C)] 98.2 F (36.8 C) (06/21 0809) Pulse Rate:  [80-87] 84 (06/21 0809) Resp:  [16-20] 16 (06/21 0809) BP: (102-130)/(64-70) 102/68 (06/21 0809) SpO2:  [99 %-100 %] 99 % (06/21 0809)  Weight change:  Filed Weights   12/25/18 0607  Weight: 81.6 kg    Intake/Output: I/O last 3 completed shifts: In: 2585 [P.O.:480; I.V.:775; IV Piggyback:100] Out: 2778 [EUMPN:3614; Other:150]   Intake/Output this shift:  No intake/output data recorded.  Physical Exam: General: NAD,   Head: Normocephalic, atraumatic. Moist oral mucosal membranes  Eyes: Anicteric, PERRL  Neck: Supple, trachea midline  Lungs:  Clear to auscultation  Heart: Regular rate and rhythm  Abdomen:  + tender to palpation  Extremities:  trace  peripheral edema.  Neurologic: Nonfocal, moving all four extremities  Skin: No lesions   GU Bilateral percutaneous nephrostomy tube, indwelling foley catheter    Basic Metabolic Panel: Recent Labs  Lab 12/27/18 0520 12/28/18 0337 12/29/18 0112 12/30/18 0655 12/31/18 1009  NA 144 144 142 146* 142  K 4.0 3.8 3.4* 3.8 4.4  CL 106 106 102 105 102  CO2 24 26 26 26 27   GLUCOSE 150* 158* 143* 149* 155*  BUN 75* 69* 69* 72* 76*  CREATININE 5.39* 5.28* 5.19* 5.07* 4.98*  CALCIUM 8.4* 8.4* 8.2* 8.8* 8.8*  PHOS  --  5.2*  --   --  5.0*    Liver Function Tests: Recent Labs  Lab 12/25/18 0615 12/28/18 0337 12/31/18 1009  AST 16  --   --   ALT 18  --   --   ALKPHOS 67  --   --   BILITOT 0.5  --   --   PROT 7.8  --   --   ALBUMIN 3.0* 2.6* 2.9*   No results for input(s): LIPASE, AMYLASE in the last 168 hours. No results for input(s): AMMONIA in the last 168 hours.  CBC: Recent Labs  Lab 12/25/18 0615 12/26/18 0255  12/27/18 0520 12/28/18 0337 12/29/18 0112 12/30/18 0655  WBC 8.5 9.2 7.0 6.7 8.4 10.0  NEUTROABS 6.9  --   --   --   --   --   HGB 8.3* 8.1* 8.0* 8.1* 8.2* 9.0*  HCT 27.8* 26.8* 26.3* 26.5* 26.7* 29.6*  MCV 88.3 89.0 86.8 86.9 86.4 88.9  PLT 128* 130* 131* 128* 134* 125*    Cardiac Enzymes: Recent Labs  Lab 12/25/18 0615  TROPONINI 0.06*    BNP: Invalid input(s): POCBNP  CBG: Recent Labs  Lab 12/30/18 0801 12/30/18 1138 12/30/18 2212 12/31/18 0804 12/31/18 1152  GLUCAP 133* 179* 214* 136* 132*    Microbiology: Results for orders placed or performed during the hospital encounter of 12/25/18  Culture, blood (routine x 2)     Status: None   Collection Time: 12/25/18  6:15 AM   Specimen: BLOOD  Result Value Ref Range Status   Specimen Description BLOOD LEFT ANTECUBITAL  Final   Special Requests   Final    BOTTLES DRAWN AEROBIC AND ANAEROBIC Blood Culture adequate volume   Culture   Final    NO GROWTH 5 DAYS Performed at [facility], [address] [place], [place] [address]    Report Status 12/30/2018 FINAL  Final  Culture, blood (routine x 2)     Status: Abnormal   Collection Time: 12/25/18  6:15 AM   Specimen: BLOOD  Result Value Ref Range Status   Specimen Description   Final    BLOOD RIGHT FOREARM Performed at [facility], [address], [place], [place] [address]    Special Requests   Final    BOTTLES DRAWN AEROBIC AND ANAEROBIC Blood Culture results may not be optimal due to an excessive volume of blood received in culture bottles Performed at [facility], [address] [place], [place] [address]    Culture  Setup Time   Final    GRAM NEGATIVE RODS AEROBIC BOTTLE ONLY CRITICAL RESULT CALLED TO, READ BACK BY AND VERIFIED WITH:  DAVID BESANTI PHARMD 0300 12/26/2018 HNM Performed at [facility], [place] [address], [place], [place] [address]    Culture PSEUDOMONAS AERUGINOSA (A)  Final   Report Status 12/28/2018  FINAL  Final   Organism ID, Bacteria PSEUDOMONAS AERUGINOSA  Final      Susceptibility   Pseudomonas aeruginosa - MIC*    CEFTAZIDIME 4 SENSITIVE Sensitive     CIPROFLOXACIN 2 INTERMEDIATE Intermediate     GENTAMICIN <=1 SENSITIVE Sensitive     IMIPENEM 1 SENSITIVE Sensitive     PIP/TAZO 8 SENSITIVE Sensitive     CEFEPIME 2 SENSITIVE Sensitive     * PSEUDOMONAS AERUGINOSA  Urine culture     Status: Abnormal   Collection Time: 12/25/18  6:15 AM   Specimen: Urine, Random  Result Value Ref Range Status   Specimen Description   Final    URINE, RANDOM Performed at [facility], [address], [place], [place] [address]    Special Requests   Final    NONE Performed at [facility], [address] [place], [place] [address]    Culture >=100,000 COLONIES/mL PSEUDOMONAS AERUGINOSA (A)  Final   Report Status 12/28/2018 FINAL  Final   Organism ID, Bacteria PSEUDOMONAS AERUGINOSA (A)  Final      Susceptibility   Pseudomonas aeruginosa - MIC*    CEFTAZIDIME 4 SENSITIVE Sensitive     CIPROFLOXACIN 2 INTERMEDIATE Intermediate     GENTAMICIN <=1 SENSITIVE Sensitive     IMIPENEM 2 SENSITIVE Sensitive     PIP/TAZO 8 SENSITIVE Sensitive     CEFEPIME 2 SENSITIVE Sensitive     * >=100,000 COLONIES/mL PSEUDOMONAS AERUGINOSA  SARS Coronavirus 2 (CEPHEID- Performed in [place] hospital lab), Hosp Order     Status: None   Collection Time: 12/25/18  6:15 AM   Specimen: Nasopharyngeal  Result Value Ref Range Status   SARS Coronavirus 2 NEGATIVE NEGATIVE Final    Comment: (NOTE) If result is NEGATIVE SARS-CoV-2 target nucleic acids are NOT DETECTED. The SARS-CoV-2 RNA is generally detectable in upper and lower  respiratory specimens during the acute phase of infection. The lowest  concentration of SARS-CoV-2 viral copies this assay can detect is 250  copies / mL. A negative result does not preclude SARS-CoV-2 infection  and should not be used as the sole basis for  treatment or other  patient management decisions.  A negative result may occur with  improper specimen collection / handling, submission of specimen other  than nasopharyngeal swab, presence of viral mutation(s) within the  areas targeted by this assay, and inadequate number of viral copies  (<250 copies / mL). A negative result must be combined with clinical  observations, patient history, and epidemiological information. If  result is POSITIVE SARS-CoV-2 target nucleic acids are DETECTED. The SARS-CoV-2 RNA is generally detectable in upper and lower  respiratory specimens dur ing the acute phase of infection.  Positive  results are indicative of active infection with SARS-CoV-2.  Clinical  correlation with patient history and other diagnostic information is  necessary to determine patient infection status.  Positive results do  not rule out bacterial infection or co-infection with other viruses. If result is PRESUMPTIVE POSTIVE SARS-CoV-2 nucleic acids MAY BE PRESENT.   A presumptive positive result was obtained on the submitted specimen  and confirmed on repeat testing.  While 2019 novel coronavirus  (SARS-CoV-2) nucleic acids may be present in the submitted sample  additional confirmatory testing may be necessary for epidemiological  and / or clinical management purposes  to differentiate between  SARS-CoV-2 and other Sarbecovirus currently known to infect humans.  If clinically indicated additional testing with an alternate test  methodology [phone]) is advised. The SARS-CoV-2 RNA is generally  detectable in upper and lower respiratory sp ecimens during the acute  phase of infection. The expected result is Negative. Fact Sheet for Patients:  StrictlyIdeas.no Fact Sheet for Healthcare Providers: BankingDealers.co.za This test is not yet approved or cleared by the [place] FDA and has been authorized for detection and/or  diagnosis of SARS-CoV-2 by FDA under an Emergency Use Authorization (EUA).  This EUA will remain in effect (meaning this test can be used) for the duration of the COVID-19 declaration under Section 564(b)(1) of the Act, 21 U.S.C. section 360bbb-3(b)(1), unless the authorization is terminated or revoked sooner. Performed at [facility], [address] [place], [place] [address]   Blood Culture ID Panel (Reflexed)     Status: Abnormal   Collection Time: 12/25/18  6:15 AM  Result Value Ref Range Status   Enterococcus species NOT DETECTED NOT DETECTED Final   Listeria monocytogenes NOT DETECTED NOT DETECTED Final   Staphylococcus species NOT DETECTED NOT DETECTED Final   Staphylococcus aureus (BCID) NOT DETECTED NOT DETECTED Final   Streptococcus species NOT DETECTED NOT DETECTED Final   Streptococcus agalactiae NOT DETECTED NOT DETECTED Final   Streptococcus pneumoniae NOT DETECTED NOT DETECTED Final   Streptococcus pyogenes NOT DETECTED NOT DETECTED Final   Acinetobacter baumannii NOT DETECTED NOT DETECTED Final   Enterobacteriaceae species NOT DETECTED NOT DETECTED Final   Enterobacter cloacae complex NOT DETECTED NOT DETECTED Final   Escherichia coli NOT DETECTED NOT DETECTED Final   Klebsiella oxytoca NOT DETECTED NOT DETECTED Final   Klebsiella pneumoniae NOT DETECTED NOT DETECTED Final   Proteus species NOT DETECTED NOT DETECTED Final   Serratia marcescens NOT DETECTED NOT DETECTED Final   Carbapenem resistance NOT DETECTED NOT DETECTED Final   Haemophilus influenzae NOT DETECTED NOT DETECTED Final   Neisseria meningitidis NOT DETECTED NOT DETECTED Final   Pseudomonas aeruginosa DETECTED (A) NOT DETECTED Final    Comment: CRITICAL RESULT CALLED TO, READ BACK BY AND VERIFIED WITH: DAVID BESANTI PHARMD 0300 12/26/2018 HNM    Candida albicans NOT DETECTED NOT DETECTED Final   Candida glabrata NOT DETECTED NOT DETECTED Final   Candida krusei NOT DETECTED NOT DETECTED  Final   Candida parapsilosis NOT DETECTED NOT DETECTED Final   Candida tropicalis NOT DETECTED NOT DETECTED Final    Comment: Performed at [facility], [address] [place], [place] [address]  CULTURE, BLOOD (ROUTINE X 2) w Reflex to ID Panel     Status: None (Preliminary result)   Collection Time: 12/29/18  1:12 AM   Specimen: BLOOD  Result Value Ref Range Status   Specimen Description BLOOD LEFT ANTECUBITAL  Final   Special Requests   Final    BOTTLES DRAWN AEROBIC AND ANAEROBIC Blood Culture adequate volume   Culture   Final    NO GROWTH 2 DAYS Performed at [facility], [address] [place], [place] [address]    Report Status PENDING  Incomplete  CULTURE, BLOOD (ROUTINE X 2) w Reflex to ID Panel     Status: None (Preliminary result)   Collection Time: 12/29/18  1:17 AM   Specimen: BLOOD  Result Value Ref Range Status   Specimen Description BLOOD RIGHT ANTECUBITAL  Final   Special Requests   Final    BOTTLES DRAWN AEROBIC AND ANAEROBIC Blood Culture adequate volume   Culture   Final    NO GROWTH 2 DAYS Performed at [facility], [address], [place], [place] [address]    Report Status PENDING  Incomplete    Coagulation Studies: No results for input(s): LABPROT, INR in the last 72 hours.  Urinalysis: No results for input(s): COLORURINE, LABSPEC, PHURINE, GLUCOSEU, HGBUR, BILIRUBINUR, KETONESUR, PROTEINUR, UROBILINOGEN, NITRITE, LEUKOCYTESUR in the last 72 hours.  Invalid input(s): APPERANCEUR    Imaging: [place] Intraoperative  Result Date: 12/29/2018 CLINICAL DATA:  Ultrasound was provided for use by the ordering physician, and a technical charge was applied by the performing facility.  No radiologist interpretation/professional services rendered.   [place] Ekg Site Rite  Result Date: 12/30/2018 If [facility] image not attached, placement could not be confirmed due to current cardiac rhythm.    Medications:   . sodium chloride    .  sodium chloride 10 mL/hr at 12/31/18 1058  . ceFEPime (MAXIPIME) IV 1 g (12/31/18 1100)   . amLODipine  2.5 mg Oral Daily  . finasteride  5 mg Oral Daily  . furosemide  40 mg Intravenous Q12H  . heparin injection (subcutaneous)  5,000 Units Subcutaneous Q8H  . insulin aspart  0-5 Units Subcutaneous QHS  . insulin aspart  0-9 Units Subcutaneous TID WC  . metoprolol tartrate  50 mg Oral BID  . pantoprazole  40 mg Oral Daily  . rosuvastatin  5 mg Oral QHS  . sodium chloride flush  10-40 mL Intracatheter Q12H  . tamsulosin  0.4 mg Oral Daily   sodium chloride, acetaminophen **OR** acetaminophen, ondansetron **OR** ondansetron (ZOFRAN) IV, sodium chloride flush  Assessment/ Plan:  [name] is a 85 y.o. black male with high-grade bladder cancer, incompletely resected tumor, severe bilateral hydronephrosis, history of prostate cancer, recurrent UTIs, atrial fibrillation, pacemaker/defibrillator, diabetes mellitus type II who is admitted to [facility] on 12/25/2018 for bilateral hydronephrosis  1. Acute renal failure with obstructive uropathy. With metabolic acidosis and hyponatremia.  On Chronic kidney disease stage IV.  Baseline creatinine 2.22, GFR of 28 09/13/2018 Status post left percutaneous nephrostomy tube placement by IR on 6/16. Right placed on on 6/19.  - Appreciate urology input  2. Hypernatremia: improved 142.  - hold furosemide  3. Hypertension with diastolic congestive heart failure:  - amlodipine, metoprolol, and tamsulosin  4. Urinary tract infection: blood cultures with pseudomonas - cefepime.   5. Anemia of chronic kidney disease: hemoglobin 9.    LOS: 6 Parrie Rasco 6/21/202012:36 PM

## 2019-01-01 ENCOUNTER — Telehealth: Payer: Self-pay | Admitting: Radiology

## 2019-01-01 ENCOUNTER — Other Ambulatory Visit: Payer: Self-pay | Admitting: Radiology

## 2019-01-01 ENCOUNTER — Inpatient Hospital Stay: Payer: Medicare Other

## 2019-01-01 ENCOUNTER — Encounter: Payer: Self-pay | Admitting: Interventional Radiology

## 2019-01-01 HISTORY — PX: IR NEPHROSTOMY PLACEMENT RIGHT: IMG6064

## 2019-01-01 LAB — GLUCOSE, CAPILLARY
Glucose-Capillary: 141 mg/dL — ABNORMAL HIGH (ref 70–99)
Glucose-Capillary: 166 mg/dL — ABNORMAL HIGH (ref 70–99)
Glucose-Capillary: 92 mg/dL (ref 70–99)
Glucose-Capillary: 177 mg/dL — ABNORMAL HIGH (ref 70–99)

## 2019-01-01 LAB — HEMOGLOBIN: Hemoglobin: 8.3 g/dL — ABNORMAL LOW (ref 13.0–17.0)

## 2019-01-01 MED ORDER — GLUCERNA SHAKE PO LIQD: 237.0000 mL | Freq: Two times a day (BID) | ORAL | 0 refills | Status: AC

## 2019-01-01 MED ORDER — SITAGLIPTIN PHOSPHATE 50 MG PO TABS: 25.0000 mg | ORAL_TABLET | Freq: Every day | ORAL | 0 refills | Status: AC

## 2019-01-01 NOTE — Progress Notes (Addendum)
Patient ID: Blake [name], male   DOB: [birth year], 85 y.o.   MRN: [id number] Spoke with Dr Primus Bravo CT abd/pelvis w/o contrast to see Nephrostomy tube position on right Check hgb   Discussed CT results with patient's daughter on the phone. I do not see any mention of issues with nephrectomy tubes. Will continue to monitor. Hemoglobin is 8.3. No indication for transfusion at present.  Dr. Bernardo Heater aware of CT abdomen results. Okay to remove Foley catheter.

## 2019-01-01 NOTE — TOC Transition Note (Signed)
Transition of Care [facility]) - CM/SW Discharge Note   Patient Details  Name: Blake [name] MRN: [id number] Date of Birth: [birth year]  Transition of Care [facility]) CM/SW Contact:  Elza Rafter, RN Phone Number: 01/01/2019, 1:41 PM   Clinical Narrative:   Patient discharging to home today.  Notified Corene Cornea with [place] and Carolynn Sayers, RN with [facility].      Final next level of care: [place] Barriers to Discharge: No Barriers Identified   Patient Goals and CMS Choice Patient states their goals for this hospitalization and ongoing recovery are:: Return to [facility] by [facility] CMS Medicare.gov Compare Post Acute Care list provided to:: Patient Represenative (must comment)(Daughter [name]) Choice offered to / list presented to : Adult Children(Rosalyn Primus Bravo DDS [phone])  Discharge Placement                       Discharge Plan and Services In-house Referral: Clinical Social Work Discharge Planning Services: CM Consult Post Acute Care Choice: Home Health                    HH Arranged: RN, PT, Nurse's Aide, Social Work [facility] Agency: [place] ([place]) Date [place]: 12/25/18 Time [place]: 1000 Representative spoke with at [place]: [place] (SDOH) Interventions     Readmission Risk Interventions Readmission Risk Prevention Plan 12/30/2018 12/25/2018 11/24/2018  Transportation Screening Complete Complete Complete  PCP or Specialist Appt within 3-5 Days - - Complete  HRI or [place] - Complete Complete  Social Work Consult for [place] Planning/Counseling - Complete (No Data)  Palliative Care Screening - - Not Applicable  Medication Review Press photographer) Complete Complete Complete  HRI or Home Care Consult Complete - -  Some recent data might be hidden

## 2019-01-01 NOTE — Telephone Encounter (Signed)
ERROR

## 2019-01-01 NOTE — Discharge Summary (Signed)
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 AAA (abdominal aortic aneurysm) ([place])   . Arrhythmia   . Diabetes mellitus without complication ([place])   . GI bleed   . Heart murmur   . Hyperlipidemia   . Hypertension   . Pacemaker   . Prostate cancer [facility])     HOSPITAL COURSE:   85 year old male with past medical history of abdominal aortic aneurysm, hypertension, hyperlipidemia, history of prostate cancer, diabetes who presented to the hospital due to bilateral hydroureteronephrosis, increasing shortness of breath and noted to be in acute renal failure.  1.   Pseudomonas bacteremia- secondary to UTI. - Continue IV cefepime till July 10th -pt was seen by infectious disease and repeat blood cultures negatvie so far   -s/p PICC line placement June 20th 2. Acute on Chronic renal failure III- secondary to obstruction from bilateral hydroureteronephrosis with h/o Bladder cancer -Patient is status post percutaneous bilateral nephrostomy tube and foley -Renal function at baseline is around 2.3 and currently elevated over 5.  -Nephrology following.  Continue sodium bicarbonate, no acute indication for hemodialysis presently. -creat 4.98 at discharge  3. Acute on chronic diastolic  CHF-this is the source of patient's worsening shortness of breath. - Improving with IV diuresis--d/c lasix--appears dry - about 10 L (-) since admission and responding to diuresis -  Continue Norvasc, metoprolol. -d/ced IV lasix will 4. Essential Hypertension - cont. Norvasc, Metoprolol.   5. DM Type II - cont. SSI and follow BS.  -restart Januvia 25 mg qd  6. GERD - cont. Protonix.   7. Hyperlipidemia - cont. Crestor.   8. BPH - cont. Flomax, Finasteride.  - s/p IR guided Perc. Bilateral Nephrostomy tubes  Called patient's daughter Dr Primus Bravo and updated her over the phone yday. 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  Patient will discharge on Monday with home health PT and RN for IV antibiotic--dter agreeable  CODE STATUS: Full code  DVT Prophylaxis: Hep. SQ CONSULTS OBTAINED:  Treatment Team:  Lucas Mallow, MD  DRUG ALLERGIES:   Allergies  Allergen Reactions  . Diovan [Valsartan] Cough  . Gabapentin Other (See Comments)    Sedation at all doses  . Hydralazine Itching  . Lisinopril Cough  . Pregabalin Other (See Comments)    Sedation at all doses  . Levofloxacin Other (See Comments)    Too many side effects. Nausea, vomiting, upset stomach, increased confusion, etc Other reaction(s): Confusion Nausea, chills Nausea, chills   . Sulfamethoxazole-Trimethoprim Other (See Comments)    Too many side effects. Nausea, vomiting, upset stomach, increased confusion, etc    DISCHARGE MEDICATIONS:   Allergies as of 01/01/2019      Reactions   Diovan [valsartan] Cough   Gabapentin Other (See Comments)   Sedation at  all doses   Hydralazine Itching   Lisinopril Cough   Pregabalin Other (See Comments)   Sedation at all doses   Levofloxacin Other (See Comments)   Too many side effects. Nausea, vomiting, upset stomach, increased confusion, etc Other reaction(s): Confusion Nausea, chills Nausea, chills    Sulfamethoxazole-trimethoprim Other (See Comments)   Too many side effects. Nausea, vomiting, upset stomach, increased confusion, etc      Medication List    STOP taking these medications   cefTAZidime  IVPB Commonly known as: FORTAZ     TAKE these medications   amLODipine 2.5 MG tablet Commonly known as: NORVASC Take 1 tablet (2.5 mg total) by mouth daily.   feeding supplement (GLUCERNA SHAKE) Liqd Take 237 mLs by mouth 2 (two) times daily between meals.   finasteride 5 MG tablet Commonly known as: PROSCAR Take 5 mg by mouth daily.   Metoprolol Tartrate 37.5 MG Tabs Take 37.5 mg by mouth 2 (two) times daily. What changed: how much to take   pantoprazole 40 MG tablet Commonly known as: PROTONIX Take 40 mg by mouth daily.   PRESERVISION/LUTEIN PO Take 1 capsule by mouth 2 (two) times daily.   multivitamin-iron-minerals-folic acid chewable tablet Chew 2 tablets by mouth daily.   rosuvastatin 5 MG tablet Commonly known as: CRESTOR Take 5 mg by mouth at bedtime.   sitaGLIPtin 50 MG tablet Commonly known as: JANUVIA Take 0.5 tablets (25 mg total) by mouth daily. What changed: how much to take   sodium bicarbonate 650 MG tablet Take 1 tablet (650 mg total) by mouth 2 (two) times daily.   tamsulosin 0.4 MG Caps capsule Commonly known as: FLOMAX Take 0.4 mg by mouth daily.   VITRON-C PO Take 1 tablet by mouth daily.       If you experience worsening of your admission symptoms, develop shortness of breath, life threatening emergency, suicidal or homicidal thoughts you must seek medical attention immediately by calling 911 or calling your MD immediately  if symptoms less severe.  You Must read complete instructions/literature along with all the possible adverse reactions/side effects for all the Medicines you take and that have been prescribed to you. Take any new Medicines after you have completely understood and accept all the possible adverse reactions/side  effects.   Please note  You were cared for by a hospitalist during your hospital stay. If you have any questions about your discharge medications or the care you received while you were in the hospital after you are discharged, you can call the unit and asked to speak with the hospitalist on call if the hospitalist that took care of you is not available. Once you are discharged, your primary care physician will handle any further medical issues. Please note that NO REFILLS for any discharge medications will be authorized once you are discharged, as it is imperative that you return to your primary care physician (or establish a relationship with a primary care physician if you do not have one) for your aftercare needs so that they can reassess your need for medications and monitor your lab values. Today   SUBJECTIVE    No new complaints VITAL SIGNS:  Blood pressure (!) 155/95, pulse 98, temperature 98.5 F (36.9 C), temperature source Oral, resp. rate 19, height 6\' 2"  (1.88 m), weight 81.6 kg, SpO2 99 %.  I/O:    Intake/Output Summary (Last 24 hours) at 01/01/2019 1055 Last data filed at 01/01/2019 1012 Gross per 24 hour  Intake 720 ml  Output 1200 ml  Net -480 ml    PHYSICAL EXAMINATION:  GENERAL:  85 y.o.-year-old patient lying in the bed with no acute distress.  EYES: Pupils equal, round, reactive to light and accommodation. No scleral icterus. Extraocular muscles intact.  HEENT: Head atraumatic, normocephalic. Oropharynx and nasopharynx clear.  NECK:  Supple, no jugular venous distention. No thyroid enlargement, no tenderness.  LUNGS: Normal breath sounds bilaterally, no wheezing, rales,rhonchi or crepitation. No use of accessory muscles of respiration.  CARDIOVASCULAR: S1, S2 normal. No murmurs, rubs, or gallops.  ABDOMEN: Soft, non-tender, non-distended. Bowel sounds present. No organomegaly or mass. Bilateral nephrectomy tube and Foley catheter EXTREMITIES: No pedal edema,  cyanosis, or clubbing.  NEUROLOGIC: Cranial nerves II through XII are intact. Muscle strength 5/5 in all extremities. Sensation intact. Gait not checked.  PSYCHIATRIC: The patient is alert and oriented x 2.  SKIN: No obvious rash, lesion, or ulcer.   DATA REVIEW:   CBC  Recent Labs  Lab 12/30/18 0655  WBC 10.0  HGB 9.0*  HCT 29.6*  PLT 125*    Chemistries  Recent Labs  Lab 12/31/18 1009  NA 142  K 4.4  CL 102  CO2 27  GLUCOSE 155*  BUN 76*  CREATININE 4.98*  CALCIUM 8.8*    Microbiology Results   Recent Results (from the past 240 hour(s))  Culture, blood (routine x 2)     Status: None   Collection Time: 12/25/18  6:15 AM   Specimen: BLOOD  Result Value Ref Range Status   Specimen Description BLOOD LEFT ANTECUBITAL  Final   Special Requests   Final    BOTTLES DRAWN AEROBIC AND ANAEROBIC Blood Culture adequate volume   Culture   Final    NO GROWTH 5 DAYS Performed at [facility], [address], [place], [place] [address]    Report Status 12/30/2018 FINAL  Final  Culture, blood (routine x 2)     Status: Abnormal   Collection Time: 12/25/18  6:15 AM   Specimen: BLOOD  Result Value Ref Range Status   Specimen Description   Final    BLOOD RIGHT FOREARM Performed at [facility], [address] [place], [place] [address]    Special Requests   Final    BOTTLES DRAWN AEROBIC AND ANAEROBIC Blood Culture results may not be optimal due to an excessive volume of blood received in culture bottles Performed at [facility], [address], [place], [place] [address]    Culture  Setup Time   Final    GRAM NEGATIVE RODS AEROBIC BOTTLE ONLY CRITICAL RESULT CALLED TO, READ BACK BY AND VERIFIED WITH:  DAVID BESANTI PHARMD 0300 12/26/2018 HNM Performed at [facility], [place] [address], [place], [place] [address]    Culture PSEUDOMONAS AERUGINOSA (A)  Final   Report Status 12/28/2018 FINAL  Final   Organism ID, Bacteria PSEUDOMONAS  AERUGINOSA  Final      Susceptibility   Pseudomonas aeruginosa - MIC*    CEFTAZIDIME 4 SENSITIVE Sensitive     CIPROFLOXACIN 2 INTERMEDIATE Intermediate     GENTAMICIN <=1 SENSITIVE Sensitive     IMIPENEM 1 SENSITIVE Sensitive     PIP/TAZO 8 SENSITIVE Sensitive     CEFEPIME 2 SENSITIVE Sensitive     * PSEUDOMONAS AERUGINOSA  Urine culture     Status: Abnormal   Collection Time: 12/25/18  6:15 AM   Specimen: Urine, Random  Result Value Ref Range Status   Specimen Description  Final    URINE, RANDOM Performed at [facility], [address] [place], [place] [address]    Special Requests   Final    NONE Performed at [facility], [place], [place] [address]    Culture >=100,000 COLONIES/mL PSEUDOMONAS AERUGINOSA (A)  Final   Report Status 12/28/2018 FINAL  Final   Organism ID, Bacteria PSEUDOMONAS AERUGINOSA (A)  Final      Susceptibility   Pseudomonas aeruginosa - MIC*    CEFTAZIDIME 4 SENSITIVE Sensitive     CIPROFLOXACIN 2 INTERMEDIATE Intermediate     GENTAMICIN <=1 SENSITIVE Sensitive     IMIPENEM 2 SENSITIVE Sensitive     PIP/TAZO 8 SENSITIVE Sensitive     CEFEPIME 2 SENSITIVE Sensitive     * >=100,000 COLONIES/mL PSEUDOMONAS AERUGINOSA  SARS Coronavirus 2 (CEPHEID- Performed in [place] hospital lab), Hosp Order     Status: None   Collection Time: 12/25/18  6:15 AM   Specimen: Nasopharyngeal  Result Value Ref Range Status   SARS Coronavirus 2 NEGATIVE NEGATIVE Final    Comment: (NOTE) If result is NEGATIVE SARS-CoV-2 target nucleic acids are NOT DETECTED. The SARS-CoV-2 RNA is generally detectable in upper and lower  respiratory specimens during the acute phase of infection. The lowest  concentration of SARS-CoV-2 viral copies this assay can detect is 250  copies / mL. A negative result does not preclude SARS-CoV-2 infection  and should not be used as the sole basis for treatment or other  patient management decisions.  A  negative result may occur with  improper specimen collection / handling, submission of specimen other  than nasopharyngeal swab, presence of viral mutation(s) within the  areas targeted by this assay, and inadequate number of viral copies  (<250 copies / mL). A negative result must be combined with clinical  observations, patient history, and epidemiological information. If result is POSITIVE SARS-CoV-2 target nucleic acids are DETECTED. The SARS-CoV-2 RNA is generally detectable in upper and lower  respiratory specimens dur ing the acute phase of infection.  Positive  results are indicative of active infection with SARS-CoV-2.  Clinical  correlation with patient history and other diagnostic information is  necessary to determine patient infection status.  Positive results do  not rule out bacterial infection or co-infection with other viruses. If result is PRESUMPTIVE POSTIVE SARS-CoV-2 nucleic acids MAY BE PRESENT.   A presumptive positive result was obtained on the submitted specimen  and confirmed on repeat testing.  While 2019 novel coronavirus  (SARS-CoV-2) nucleic acids may be present in the submitted sample  additional confirmatory testing may be necessary for epidemiological  and / or clinical management purposes  to differentiate between  SARS-CoV-2 and other Sarbecovirus currently known to infect humans.  If clinically indicated additional testing with an alternate test  methodology [phone]) is advised. The SARS-CoV-2 RNA is generally  detectable in upper and lower respiratory sp ecimens during the acute  phase of infection. The expected result is Negative. Fact Sheet for Patients:  StrictlyIdeas.no Fact Sheet for Healthcare Providers: BankingDealers.co.za This test is not yet approved or cleared by the [place] FDA and has been authorized for detection and/or diagnosis of SARS-CoV-2 by FDA under an Emergency Use  Authorization (EUA).  This EUA will remain in effect (meaning this test can be used) for the duration of the COVID-19 declaration under Section 564(b)(1) of the Act, 21 U.S.C. section 360bbb-3(b)(1), unless the authorization is terminated or revoked sooner. Performed at [facility], [address] [place], [place] [address]   Blood Culture ID Panel (Reflexed)     Status: Abnormal   Collection Time: 12/25/18  6:15 AM  Result Value Ref Range Status   Enterococcus species NOT DETECTED NOT DETECTED Final   Listeria monocytogenes NOT DETECTED NOT DETECTED Final   Staphylococcus species NOT DETECTED NOT DETECTED Final   Staphylococcus aureus (BCID) NOT DETECTED NOT DETECTED Final   Streptococcus species NOT DETECTED NOT DETECTED Final   Streptococcus agalactiae NOT DETECTED NOT DETECTED Final   Streptococcus pneumoniae NOT DETECTED NOT DETECTED Final   Streptococcus pyogenes NOT DETECTED NOT DETECTED Final   Acinetobacter baumannii NOT DETECTED NOT DETECTED Final   Enterobacteriaceae species NOT DETECTED NOT DETECTED Final   Enterobacter cloacae complex NOT DETECTED NOT DETECTED Final   Escherichia coli NOT DETECTED NOT DETECTED Final   Klebsiella oxytoca NOT DETECTED NOT DETECTED Final   Klebsiella pneumoniae NOT DETECTED NOT DETECTED Final   Proteus species NOT DETECTED NOT DETECTED Final   Serratia marcescens NOT DETECTED NOT DETECTED Final   Carbapenem resistance NOT DETECTED NOT DETECTED Final   Haemophilus influenzae NOT DETECTED NOT DETECTED Final   Neisseria meningitidis NOT DETECTED NOT DETECTED Final   Pseudomonas aeruginosa DETECTED (A) NOT DETECTED Final    Comment: CRITICAL RESULT CALLED TO, READ BACK BY AND VERIFIED WITH: DAVID BESANTI PHARMD 0300 12/26/2018 HNM    Candida albicans NOT DETECTED NOT DETECTED Final   Candida glabrata NOT DETECTED NOT DETECTED Final   Candida krusei NOT DETECTED NOT DETECTED Final   Candida parapsilosis NOT DETECTED NOT DETECTED  Final   Candida tropicalis NOT DETECTED NOT DETECTED Final    Comment: Performed at [facility], [address] [place], [place] [address]  CULTURE, BLOOD (ROUTINE X 2) w Reflex to ID Panel     Status: None (Preliminary result)   Collection Time: 12/29/18  1:12 AM   Specimen: BLOOD  Result Value Ref Range Status   Specimen Description BLOOD LEFT ANTECUBITAL  Final   Special Requests   Final    BOTTLES DRAWN AEROBIC AND ANAEROBIC Blood Culture adequate volume   Culture   Final    NO GROWTH 3 DAYS Performed at [facility], [address] [place], [place] [address]    Report Status PENDING  Incomplete  CULTURE, BLOOD (ROUTINE X 2) w Reflex to ID Panel     Status: None (Preliminary result)   Collection Time: 12/29/18  1:17 AM   Specimen: BLOOD  Result Value Ref Range Status   Specimen Description BLOOD RIGHT ANTECUBITAL  Final   Special Requests   Final    BOTTLES DRAWN AEROBIC AND ANAEROBIC Blood Culture adequate volume   Culture   Final    NO GROWTH 3 DAYS Performed at [facility], [address] [place],  [address]    Report Status PENDING  Incomplete    RADIOLOGY:  Ir Nephrostomy Placement Right  Result Date: 01/01/2019 INDICATION: 85 year old male with a history of bilateral ureteral occlusion EXAM: IR NEPHROSTOMY PLACEMENT RIGHT; ULTRAOUND INTRAOPERATIVE - NRPT [facility] COMPARISON:  None. MEDICATIONS: None ANESTHESIA/SEDATION: Fentanyl 50 mcg IV; Versed 1 mg IV Moderate Sedation Time:  20 minutes The patient was continuously monitored during the procedure by the interventional radiology nurse under my direct supervision. CONTRAST:  10 cc-administered into the collecting system(s) FLUOROSCOPY TIME:  Fluoroscopy Time: 3 minutes 0 seconds COMPLICATIONS: None PROCEDURE: Informed written consent was obtained from the patient's family after a thorough discussion of  the procedural risks, benefits and alternatives. All questions were addressed. Maximal  Sterile Barrier Technique was utilized including caps, mask, sterile gowns, sterile gloves, sterile drape, hand hygiene and skin antiseptic. A timeout was performed prior to the initiation of the procedure. Patient positioned prone position on the fluoroscopy table. Ultrasound survey of the right flank was performed with images stored and sent to PACs. The patient was then prepped and draped in the usual sterile fashion. 1% lidocaine was used to anesthetize the skin and subcutaneous tissues for local anesthesia. A Chiba needle was then used to access a posterior inferior calyx with ultrasound guidance. With spontaneous urine returned through the needle, passage of an 018 micro wire into the collecting system was performed under fluoroscopy. A small incision was made with an 11 blade scalpel, and the needle was removed from the wire. An Accustick system was then advanced over the wire into the collecting system under fluoroscopy. The metal stiffener and inner dilator were removed, and then a sample of fluid was aspirated through the 4 French outer sheath. Bentson wire was passed into the collecting system and the sheath removed. Ten French dilation of the soft tissues was performed. Using modified Seldinger technique, a 10 French pigtail catheter drain was placed over the Bentson wire. Wire and inner stiffener removed, and the pigtail was formed in the collecting system. Small amount of contrast confirmed position of the catheter. Patient tolerated the procedure well and remained hemodynamically stable throughout. No complications were encountered and no significant blood loss encountered IMPRESSION: Status post right percutaneous nephrostomy. Signed, Dulcy Fanny. Dellia Nims, RPVI [facility] Radiology Electronically Signed   By: Corrie Mckusick D.O.   On: 01/01/2019 07:42     CODE STATUS:     Code Status Orders  (From admission, onward)         Start     Ordered    12/25/18 1234  Full code  Continuous     12/25/18 1233        Code Status History    Date Active Date Inactive Code Status Order ID Comments User Context   12/25/2018 0645 12/25/2018 1233 Full Code [id number]  Paulette Blanch, MD ED   11/21/2018 2217 11/29/2018 2052 Full Code [id number]  Mayer Camel, NP ED   11/05/2018 1755 11/09/2018 1728 Full Code [id number]  Mayo, Pete Pelt, MD Inpatient   09/10/2017 1430 09/13/2017 2115 Full Code [id number]  Idelle Crouch, MD Inpatient   06/10/2017 2304 06/12/2017 1908 Full Code [id number]  Lance Coon, MD Inpatient   05/17/2017 0829 05/19/2017 1525 Full Code [id number]  Hillary Bow, MD ED   03/31/2017 1334 04/03/2017 1611 Full Code [id number]  Nicholes Mango, MD Inpatient   10/21/2016 0155 10/25/2016 2145 Full Code [id number]  Hugelmeyer, [place], DO Inpatient   10/09/2016 2342 10/13/2016 2009 Full Code [id number]  Vaughan Basta, MD Inpatient   04/14/2015 1432 04/15/2015 1739 Full Code [id number]  Isaias Cowman, MD Inpatient   04/09/2015 0022 04/14/2015 1432 Full Code [id number]  Hower, Aaron Mose, MD ED   Advance Care Planning Activity    Advance Directive Documentation     Most Recent Value  Type of Advance Directive  Healthcare Power of Attorney  Pre-existing out of facility DNR order (yellow form or pink MOST form)  -  "MOST" Form in Place?  -      TOTAL TIME TAKING CARE OF THIS PATIENT: *40* minutes.    Fritzi Mandes M.D on 01/01/2019 at 10:55  AM  Between 7am to 6pm - Pager - [phone] After 6pm go to [email or web address] - password [email or web address] [facility]  Office  [phone]  CC: Primary care physician; Adin Hector, MD

## 2019-01-01 NOTE — Progress Notes (Signed)
[facility]  ROUNDING NOTE   Subjective:   Patient is resting comfortable.   Total urine output recorded at 1200 cc.  Left nephrostomy 900, right nephrostomy 300 cc Blood noted in the right nephrostomy tube, left nephrostomy tube has clear urine  Objective:  Vital signs in last 24 hours:  Temp:  [98.2 F (36.8 C)-98.5 F (36.9 C)] 98.5 F (36.9 C) (06/22 0744) Pulse Rate:  [73-98] 98 (06/22 0744) Resp:  [16-19] 19 (06/22 0744) BP: (102-155)/(65-95) 155/95 (06/22 0744) SpO2:  [99 %-100 %] 99 % (06/22 0744)  Weight change:  Filed Weights   12/25/18 0607  Weight: 81.6 kg    Intake/Output: I/O last 3 completed shifts: In: 6073 [P.O.:480; I.V.:775; IV Piggyback:100] Out: 2200 [Urine:2050; Other:150]   Intake/Output this shift:  Total I/O In: 480 [P.O.:480] Out: 325 [Urine:325]  Physical Exam: General: NAD, frail, elderly  Head: Normocephalic, atraumatic. Moist oral mucosal membranes  Eyes: Anicteric,   Neck: Supple,    Lungs:  Clear to auscultation  Heart: Regular rate and rhythm  Abdomen:   Soft, nontender  Extremities:  trace  peripheral edema.  Neurologic: Nonfocal, moving all four extremities  Skin: No lesions   GU Bilateral percutaneous nephrostomy tube, clear urine in left, blood-tinged urine in the right, indwelling foley catheter with bloody drainage    Basic Metabolic Panel: Recent Labs  Lab 12/27/18 0520 12/28/18 0337 12/29/18 0112 12/30/18 0655 12/31/18 1009  NA 144 144 142 146* 142  K 4.0 3.8 3.4* 3.8 4.4  CL 106 106 102 105 102  CO2 24 26 26 26 27   GLUCOSE 150* 158* 143* 149* 155*  BUN 75* 69* 69* 72* 76*  CREATININE 5.39* 5.28* 5.19* 5.07* 4.98*  CALCIUM 8.4* 8.4* 8.2* 8.8* 8.8*  PHOS  --  5.2*  --   --  5.0*    Liver Function Tests: Recent Labs  Lab 12/28/18 0337 12/31/18 1009  ALBUMIN 2.6* 2.9*   No results for input(s): LIPASE, AMYLASE in the last 168 hours. No results for input(s): AMMONIA in the last 168  hours.  CBC: Recent Labs  Lab 12/26/18 0255 12/27/18 0520 12/28/18 0337 12/29/18 0112 12/30/18 0655  WBC 9.2 7.0 6.7 8.4 10.0  HGB 8.1* 8.0* 8.1* 8.2* 9.0*  HCT 26.8* 26.3* 26.5* 26.7* 29.6*  MCV 89.0 86.8 86.9 86.4 88.9  PLT 130* 131* 128* 134* 125*    Cardiac Enzymes: No results for input(s): CKTOTAL, CKMB, CKMBINDEX, TROPONINI in the last 168 hours.  BNP: Invalid input(s): POCBNP  CBG: Recent Labs  Lab 12/31/18 1152 12/31/18 1708 12/31/18 2043 01/01/19 0746 01/01/19 1131  GLUCAP 132* 139* 140* 141* 166*    Microbiology: Results for orders placed or performed during the hospital encounter of 12/25/18  Culture, blood (routine x 2)     Status: None   Collection Time: 12/25/18  6:15 AM   Specimen: BLOOD  Result Value Ref Range Status   Specimen Description BLOOD LEFT ANTECUBITAL  Final   Special Requests   Final    BOTTLES DRAWN AEROBIC AND ANAEROBIC Blood Culture adequate volume   Culture   Final    NO GROWTH 5 DAYS Performed at [facility], [address], [place],  [address]    Report Status 12/30/2018 FINAL  Final  Culture, blood (routine x 2)     Status: Abnormal   Collection Time: 12/25/18  6:15 AM   Specimen: BLOOD  Result Value Ref Range Status   Specimen Description   Final  BLOOD RIGHT FOREARM Performed at [facility], [address] [place], [place] [address]    Special Requests   Final    BOTTLES DRAWN AEROBIC AND ANAEROBIC Blood Culture results may not be optimal due to an excessive volume of blood received in culture bottles Performed at [facility], [address], [place], [place] [address]    Culture  Setup Time   Final    GRAM NEGATIVE RODS AEROBIC BOTTLE ONLY CRITICAL RESULT CALLED TO, READ BACK BY AND VERIFIED WITH:  DAVID BESANTI PHARMD 0300 12/26/2018 HNM Performed at [facility], [place] [address], [place], [place] [address]    Culture PSEUDOMONAS AERUGINOSA (A)  Final   Report  Status 12/28/2018 FINAL  Final   Organism ID, Bacteria PSEUDOMONAS AERUGINOSA  Final      Susceptibility   Pseudomonas aeruginosa - MIC*    CEFTAZIDIME 4 SENSITIVE Sensitive     CIPROFLOXACIN 2 INTERMEDIATE Intermediate     GENTAMICIN <=1 SENSITIVE Sensitive     IMIPENEM 1 SENSITIVE Sensitive     PIP/TAZO 8 SENSITIVE Sensitive     CEFEPIME 2 SENSITIVE Sensitive     * PSEUDOMONAS AERUGINOSA  Urine culture     Status: Abnormal   Collection Time: 12/25/18  6:15 AM   Specimen: Urine, Random  Result Value Ref Range Status   Specimen Description   Final    URINE, RANDOM Performed at [facility], [address] [place], [place] [address]    Special Requests   Final    NONE Performed at [facility], [address] [place], [place] [address]    Culture >=100,000 COLONIES/mL PSEUDOMONAS AERUGINOSA (A)  Final   Report Status 12/28/2018 FINAL  Final   Organism ID, Bacteria PSEUDOMONAS AERUGINOSA (A)  Final      Susceptibility   Pseudomonas aeruginosa - MIC*    CEFTAZIDIME 4 SENSITIVE Sensitive     CIPROFLOXACIN 2 INTERMEDIATE Intermediate     GENTAMICIN <=1 SENSITIVE Sensitive     IMIPENEM 2 SENSITIVE Sensitive     PIP/TAZO 8 SENSITIVE Sensitive     CEFEPIME 2 SENSITIVE Sensitive     * >=100,000 COLONIES/mL PSEUDOMONAS AERUGINOSA  SARS Coronavirus 2 (CEPHEID- Performed in [place] hospital lab), Hosp Order     Status: None   Collection Time: 12/25/18  6:15 AM   Specimen: Nasopharyngeal  Result Value Ref Range Status   SARS Coronavirus 2 NEGATIVE NEGATIVE Final    Comment: (NOTE) If result is NEGATIVE SARS-CoV-2 target nucleic acids are NOT DETECTED. The SARS-CoV-2 RNA is generally detectable in upper and lower  respiratory specimens during the acute phase of infection. The lowest  concentration of SARS-CoV-2 viral copies this assay can detect is 250  copies / mL. A negative result does not preclude SARS-CoV-2 infection  and should not be used as the sole  basis for treatment or other  patient management decisions.  A negative result may occur with  improper specimen collection / handling, submission of specimen other  than nasopharyngeal swab, presence of viral mutation(s) within the  areas targeted by this assay, and inadequate number of viral copies  (<250 copies / mL). A negative result must be combined with clinical  observations, patient history, and epidemiological information. If result is POSITIVE SARS-CoV-2 target nucleic acids are DETECTED. The SARS-CoV-2 RNA is generally detectable in upper and lower  respiratory specimens dur ing the acute phase of infection.  Positive  results are indicative of active infection with  SARS-CoV-2.  Clinical  correlation with patient history and other diagnostic information is  necessary to determine patient infection status.  Positive results do  not rule out bacterial infection or co-infection with other viruses. If result is PRESUMPTIVE POSTIVE SARS-CoV-2 nucleic acids MAY BE PRESENT.   A presumptive positive result was obtained on the submitted specimen  and confirmed on repeat testing.  While 2019 novel coronavirus  (SARS-CoV-2) nucleic acids may be present in the submitted sample  additional confirmatory testing may be necessary for epidemiological  and / or clinical management purposes  to differentiate between  SARS-CoV-2 and other Sarbecovirus currently known to infect humans.  If clinically indicated additional testing with an alternate test  methodology [phone]) is advised. The SARS-CoV-2 RNA is generally  detectable in upper and lower respiratory sp ecimens during the acute  phase of infection. The expected result is Negative. Fact Sheet for Patients:  StrictlyIdeas.no Fact Sheet for Healthcare Providers: BankingDealers.co.za This test is not yet approved or cleared by the [place] FDA and has been authorized for detection  and/or diagnosis of SARS-CoV-2 by FDA under an Emergency Use Authorization (EUA).  This EUA will remain in effect (meaning this test can be used) for the duration of the COVID-19 declaration under Section 564(b)(1) of the Act, 21 U.S.C. section 360bbb-3(b)(1), unless the authorization is terminated or revoked sooner. Performed at [facility], [address] [place], [place] [address]   Blood Culture ID Panel (Reflexed)     Status: Abnormal   Collection Time: 12/25/18  6:15 AM  Result Value Ref Range Status   Enterococcus species NOT DETECTED NOT DETECTED Final   Listeria monocytogenes NOT DETECTED NOT DETECTED Final   Staphylococcus species NOT DETECTED NOT DETECTED Final   Staphylococcus aureus (BCID) NOT DETECTED NOT DETECTED Final   Streptococcus species NOT DETECTED NOT DETECTED Final   Streptococcus agalactiae NOT DETECTED NOT DETECTED Final   Streptococcus pneumoniae NOT DETECTED NOT DETECTED Final   Streptococcus pyogenes NOT DETECTED NOT DETECTED Final   Acinetobacter baumannii NOT DETECTED NOT DETECTED Final   Enterobacteriaceae species NOT DETECTED NOT DETECTED Final   Enterobacter cloacae complex NOT DETECTED NOT DETECTED Final   Escherichia coli NOT DETECTED NOT DETECTED Final   Klebsiella oxytoca NOT DETECTED NOT DETECTED Final   Klebsiella pneumoniae NOT DETECTED NOT DETECTED Final   Proteus species NOT DETECTED NOT DETECTED Final   Serratia marcescens NOT DETECTED NOT DETECTED Final   Carbapenem resistance NOT DETECTED NOT DETECTED Final   Haemophilus influenzae NOT DETECTED NOT DETECTED Final   Neisseria meningitidis NOT DETECTED NOT DETECTED Final   Pseudomonas aeruginosa DETECTED (A) NOT DETECTED Final    Comment: CRITICAL RESULT CALLED TO, READ BACK BY AND VERIFIED WITH: DAVID BESANTI PHARMD 0300 12/26/2018 HNM    Candida albicans NOT DETECTED NOT DETECTED Final   Candida glabrata NOT DETECTED NOT DETECTED Final   Candida krusei NOT DETECTED NOT  DETECTED Final   Candida parapsilosis NOT DETECTED NOT DETECTED Final   Candida tropicalis NOT DETECTED NOT DETECTED Final    Comment: Performed at [facility], [address] [place], [place] [address]  CULTURE, BLOOD (ROUTINE X 2) w Reflex to ID Panel     Status: None (Preliminary result)   Collection Time: 12/29/18  1:12 AM   Specimen: BLOOD  Result Value Ref Range Status   Specimen Description BLOOD LEFT ANTECUBITAL  Final   Special Requests   Final    BOTTLES DRAWN AEROBIC AND ANAEROBIC Blood Culture  adequate volume   Culture   Final    NO GROWTH 3 DAYS Performed at [facility], [address] [place], [place] [address]    Report Status PENDING  Incomplete  CULTURE, BLOOD (ROUTINE X 2) w Reflex to ID Panel     Status: None (Preliminary result)   Collection Time: 12/29/18  1:17 AM   Specimen: BLOOD  Result Value Ref Range Status   Specimen Description BLOOD RIGHT ANTECUBITAL  Final   Special Requests   Final    BOTTLES DRAWN AEROBIC AND ANAEROBIC Blood Culture adequate volume   Culture   Final    NO GROWTH 3 DAYS Performed at [facility], [address], [place], [place] [address]    Report Status PENDING  Incomplete    Coagulation Studies: No results for input(s): LABPROT, INR in the last 72 hours.  Urinalysis: No results for input(s): COLORURINE, LABSPEC, PHURINE, GLUCOSEU, HGBUR, BILIRUBINUR, KETONESUR, PROTEINUR, UROBILINOGEN, NITRITE, LEUKOCYTESUR in the last 72 hours.  Invalid input(s): APPERANCEUR    Imaging: Ir Nephrostomy Placement Right  Result Date: 01/01/2019 INDICATION: 85 year old male with a history of bilateral ureteral occlusion EXAM: IR NEPHROSTOMY PLACEMENT RIGHT; ULTRAOUND INTRAOPERATIVE - NRPT [facility] COMPARISON:  None. MEDICATIONS: None ANESTHESIA/SEDATION: Fentanyl 50 mcg IV; Versed 1 mg IV Moderate Sedation Time:  20 minutes The patient was continuously monitored during the procedure by the interventional radiology  nurse under my direct supervision. CONTRAST:  10 cc-administered into the collecting system(s) FLUOROSCOPY TIME:  Fluoroscopy Time: 3 minutes 0 seconds COMPLICATIONS: None PROCEDURE: Informed written consent was obtained from the patient's family after a thorough discussion of the procedural risks, benefits and alternatives. All questions were addressed. Maximal Sterile Barrier Technique was utilized including caps, mask, sterile gowns, sterile gloves, sterile drape, hand hygiene and skin antiseptic. A timeout was performed prior to the initiation of the procedure. Patient positioned prone position on the fluoroscopy table. Ultrasound survey of the right flank was performed with images stored and sent to PACs. The patient was then prepped and draped in the usual sterile fashion. 1% lidocaine was used to anesthetize the skin and subcutaneous tissues for local anesthesia. A Chiba needle was then used to access a posterior inferior calyx with ultrasound guidance. With spontaneous urine returned through the needle, passage of an 018 micro wire into the collecting system was performed under fluoroscopy. A small incision was made with an 11 blade scalpel, and the needle was removed from the wire. An Accustick system was then advanced over the wire into the collecting system under fluoroscopy. The metal stiffener and inner dilator were removed, and then a sample of fluid was aspirated through the 4 French outer sheath. Bentson wire was passed into the collecting system and the sheath removed. Ten French dilation of the soft tissues was performed. Using modified Seldinger technique, a 10 French pigtail catheter drain was placed over the Bentson wire. Wire and inner stiffener removed, and the pigtail was formed in the collecting system. Small amount of contrast confirmed position of the catheter. Patient tolerated the procedure well and remained hemodynamically stable throughout. No complications were encountered and no  significant blood loss encountered IMPRESSION: Status post right percutaneous nephrostomy. Signed, Dulcy Fanny. Dellia Nims, RPVI [facility] Electronically Signed   By: Corrie Mckusick D.O.   On: 01/01/2019 07:42     Medications:   . sodium chloride    . sodium chloride 10 mL/hr at 12/31/18 1058  . ceFEPime (MAXIPIME) IV  1 g (01/01/19 0856)   . amLODipine  2.5 mg Oral Daily  . feeding supplement (GLUCERNA SHAKE)  237 mL Oral BID BM  . finasteride  5 mg Oral Daily  . heparin injection (subcutaneous)  5,000 Units Subcutaneous Q8H  . insulin aspart  0-5 Units Subcutaneous QHS  . insulin aspart  0-9 Units Subcutaneous TID WC  . metoprolol tartrate  50 mg Oral BID  . multivitamin with minerals  1 tablet Oral Daily  . pantoprazole  40 mg Oral Daily  . rosuvastatin  5 mg Oral QHS  . sodium chloride flush  10-40 mL Intracatheter Q12H  . tamsulosin  0.4 mg Oral Daily   sodium chloride, acetaminophen **OR** acetaminophen, ondansetron **OR** ondansetron (ZOFRAN) IV, sodium chloride flush  Assessment/ Plan:  [name] is a 85 y.o. black male with high-grade bladder cancer, incompletely resected tumor, severe bilateral hydronephrosis, history of prostate cancer, recurrent UTIs, atrial fibrillation, pacemaker/defibrillator, diabetes mellitus type II who is admitted to [facility] on 12/25/2018 for bilateral hydronephrosis  1. Acute renal failure with obstructive uropathy, bilateral hydronephrosis. With metabolic acidosis and hyponatremia.  On Chronic kidney disease stage IV.  Baseline creatinine 2.22, GFR of 28 09/13/2018 Status post left percutaneous nephrostomy tube placement by IR on 6/16. Right placed on on 6/19.  - Appreciate urology input Serum creatinine trend Is improving slowly -Right nephrostomy is blood-tinged.  Will need imaging to rule out dislocation  2. Hypernatremia: improved 142.  - hold furosemide  3.  Anemia of chronic  kidney disease:  Lab Results  Component Value Date   HGB 9.0 (L) 12/30/2018    4. Urinary tract infection: blood cultures with pseudomonas - cefepime.     LOS: 7 Blake [name] 6/22/20202:32 PM

## 2019-01-01 NOTE — Progress Notes (Signed)
Dark blood draining from right nephrostomy tube and noted in foley cath/ instructed to flush right tube/ return drainage remains bloody/ MD aware/ will monitor

## 2019-01-01 NOTE — Telephone Encounter (Signed)
-----   Message from Nori Riis, PA-C sent at 01/01/2019  7:41 AM EDT ----- Regarding: Nephrostomy tube exchange Mr. Lefferts had bilateral nephrostomy tubes placed during his current admission.  He is being discharged today.  They were put in at separate times.  His right one was placed on Friday which is the most recent and the left tube was placed last Monday.  We need to get him scheduled for a bilateral nephrostomy tube exchange in thirty days.

## 2019-01-01 NOTE — Care Management Important Message (Signed)
Important Message  Patient Details  Name: Blake [name] MRN: [id number] Date of Birth: 02/04/1923   Medicare Important Message Given:  Yes     [name] 01/01/2019, 11:36 AM

## 2019-01-01 NOTE — Consult Note (Signed)
PHARMACY CONSULT NOTE FOR:  OUTPATIENT  PARENTERAL ANTIBIOTIC THERAPY (OPAT)  Indication: Pseudomonas bacteremia Regimen: Cefepime 1 g q24H  End date: 01/19/2019  IV antibiotic discharge orders are pended. To discharging provider:  please sign these orders via discharge navigator,  Select New Orders & click on the button choice - Manage This Unsigned Work.     Thank you for allowing pharmacy to be a part of this patient's care.  Oswald Hillock 01/01/2019, 10:24 AM

## 2019-01-01 NOTE — Progress Notes (Addendum)
Patient ID: Blake [name], male   DOB: 05-26-1923, 85 y.o.   MRN: [id number] right nephrectomy tube and Foley catheter blood seen. Informed IR nurse She will talk with Dr. Kathlene Cote.  Informed by IR nurse that some blood in the tubes is expected given patient's high-grade uroepithelial carcinoma.  I have messaged Dr. Elenor Quinones about it also to make him aware

## 2019-01-01 NOTE — Telephone Encounter (Signed)
Noted  

## 2019-01-01 NOTE — Progress Notes (Signed)
Cardiac Rehab Navigator/ Exercise Physiologist Note  Rounded on patient this am. Patient sitting up in bed. NA I in room preparing to provide patient bath. This EP greeted patient. This EP left educational materials to go home with patient. This EP included an informational sheet for caregiver to access EMMI educational videos. Patient's caregiver expressed over phone call on Friday 12/29/18 that researching information on the internet. This EP wanted to provide caregiver with reliable information. Caregiver and patient appreciative of this information.  Patient is not a candidate for rehab at this time due to cognitive impairment and deconditioning. Patient will have home health and PT. Patient and caregiver were encouraged to call the department with questions or concerns.  Jasper Loser, [facility]  Exercise Physiologist Department Phone #: [phone] Fax: [phone]  Direct Line [phone] Email Address: [email or web address]

## 2019-01-02 ENCOUNTER — Inpatient Hospital Stay
Admission: EM | Admit: 2019-01-02 | Discharge: 2019-01-09 | DRG: 100 | Disposition: A | Discharge status: Home Health | Payer: Medicare Other | Attending: Internal Medicine | Admitting: Internal Medicine

## 2019-01-02 ENCOUNTER — Other Ambulatory Visit: Payer: Self-pay

## 2019-01-02 ENCOUNTER — Emergency Department: Payer: Medicare Other

## 2019-01-02 ENCOUNTER — Inpatient Hospital Stay: Payer: Medicare Other

## 2019-01-02 DIAGNOSIS — Z8546 Personal history of malignant neoplasm of prostate: Secondary | ICD-10-CM

## 2019-01-02 DIAGNOSIS — Z96652 Presence of left artificial knee joint: Secondary | ICD-10-CM | POA: Diagnosis present

## 2019-01-02 DIAGNOSIS — I509 Heart failure, unspecified: Secondary | ICD-10-CM | POA: Diagnosis present

## 2019-01-02 DIAGNOSIS — R569 Unspecified convulsions: Secondary | ICD-10-CM | POA: Diagnosis not present

## 2019-01-02 DIAGNOSIS — Z905 Acquired absence of kidney: Secondary | ICD-10-CM

## 2019-01-02 DIAGNOSIS — J690 Pneumonitis due to inhalation of food and vomit: Secondary | ICD-10-CM | POA: Diagnosis present

## 2019-01-02 DIAGNOSIS — E876 Hypokalemia: Secondary | ICD-10-CM | POA: Diagnosis present

## 2019-01-02 DIAGNOSIS — R05 Cough: Secondary | ICD-10-CM

## 2019-01-02 DIAGNOSIS — Z1159 Encounter for screening for other viral diseases: Secondary | ICD-10-CM

## 2019-01-02 DIAGNOSIS — R319 Hematuria, unspecified: Secondary | ICD-10-CM | POA: Diagnosis present

## 2019-01-02 DIAGNOSIS — Z79899 Other long term (current) drug therapy: Secondary | ICD-10-CM

## 2019-01-02 DIAGNOSIS — K5730 Diverticulosis of large intestine without perforation or abscess without bleeding: Secondary | ICD-10-CM | POA: Diagnosis present

## 2019-01-02 DIAGNOSIS — N185 Chronic kidney disease, stage 5: Secondary | ICD-10-CM | POA: Diagnosis present

## 2019-01-02 DIAGNOSIS — M1990 Unspecified osteoarthritis, unspecified site: Secondary | ICD-10-CM | POA: Diagnosis present

## 2019-01-02 DIAGNOSIS — E1122 Type 2 diabetes mellitus with diabetic chronic kidney disease: Secondary | ICD-10-CM | POA: Diagnosis present

## 2019-01-02 DIAGNOSIS — B965 Pseudomonas (aeruginosa) (mallei) (pseudomallei) as the cause of diseases classified elsewhere: Secondary | ICD-10-CM | POA: Diagnosis present

## 2019-01-02 DIAGNOSIS — Z8249 Family history of ischemic heart disease and other diseases of the circulatory system: Secondary | ICD-10-CM

## 2019-01-02 DIAGNOSIS — Z8679 Personal history of other diseases of the circulatory system: Secondary | ICD-10-CM

## 2019-01-02 DIAGNOSIS — I700 Atherosclerosis of aorta: Secondary | ICD-10-CM | POA: Diagnosis present

## 2019-01-02 DIAGNOSIS — D631 Anemia in chronic kidney disease: Secondary | ICD-10-CM | POA: Diagnosis present

## 2019-01-02 DIAGNOSIS — Z87440 Personal history of urinary (tract) infections: Secondary | ICD-10-CM

## 2019-01-02 DIAGNOSIS — I723 Aneurysm of iliac artery: Secondary | ICD-10-CM | POA: Diagnosis present

## 2019-01-02 DIAGNOSIS — R059 Cough, unspecified: Secondary | ICD-10-CM

## 2019-01-02 DIAGNOSIS — R011 Cardiac murmur, unspecified: Secondary | ICD-10-CM | POA: Diagnosis present

## 2019-01-02 DIAGNOSIS — N179 Acute kidney failure, unspecified: Secondary | ICD-10-CM | POA: Diagnosis present

## 2019-01-02 DIAGNOSIS — R7881 Bacteremia: Secondary | ICD-10-CM | POA: Diagnosis present

## 2019-01-02 DIAGNOSIS — Z87891 Personal history of nicotine dependence: Secondary | ICD-10-CM

## 2019-01-02 DIAGNOSIS — K219 Gastro-esophageal reflux disease without esophagitis: Secondary | ICD-10-CM | POA: Diagnosis present

## 2019-01-02 DIAGNOSIS — Z882 Allergy status to sulfonamides status: Secondary | ICD-10-CM

## 2019-01-02 DIAGNOSIS — E785 Hyperlipidemia, unspecified: Secondary | ICD-10-CM | POA: Diagnosis present

## 2019-01-02 DIAGNOSIS — Z7984 Long term (current) use of oral hypoglycemic drugs: Secondary | ICD-10-CM

## 2019-01-02 DIAGNOSIS — R9401 Abnormal electroencephalogram [EEG]: Secondary | ICD-10-CM | POA: Diagnosis present

## 2019-01-02 DIAGNOSIS — Z823 Family history of stroke: Secondary | ICD-10-CM

## 2019-01-02 DIAGNOSIS — Z950 Presence of cardiac pacemaker: Secondary | ICD-10-CM

## 2019-01-02 DIAGNOSIS — C679 Malignant neoplasm of bladder, unspecified: Secondary | ICD-10-CM | POA: Diagnosis present

## 2019-01-02 DIAGNOSIS — I714 Abdominal aortic aneurysm, without rupture: Secondary | ICD-10-CM | POA: Diagnosis present

## 2019-01-02 DIAGNOSIS — R410 Disorientation, unspecified: Secondary | ICD-10-CM

## 2019-01-02 DIAGNOSIS — I959 Hypotension, unspecified: Secondary | ICD-10-CM | POA: Diagnosis present

## 2019-01-02 DIAGNOSIS — I132 Hypertensive heart and chronic kidney disease with heart failure and with stage 5 chronic kidney disease, or end stage renal disease: Secondary | ICD-10-CM | POA: Diagnosis present

## 2019-01-02 DIAGNOSIS — Z881 Allergy status to other antibiotic agents status: Secondary | ICD-10-CM

## 2019-01-02 DIAGNOSIS — I4891 Unspecified atrial fibrillation: Secondary | ICD-10-CM | POA: Diagnosis present

## 2019-01-02 DIAGNOSIS — D62 Acute posthemorrhagic anemia: Secondary | ICD-10-CM | POA: Diagnosis present

## 2019-01-02 DIAGNOSIS — Z888 Allergy status to other drugs, medicaments and biological substances status: Secondary | ICD-10-CM

## 2019-01-02 DIAGNOSIS — N400 Enlarged prostate without lower urinary tract symptoms: Secondary | ICD-10-CM | POA: Diagnosis present

## 2019-01-02 DIAGNOSIS — Z95828 Presence of other vascular implants and grafts: Secondary | ICD-10-CM

## 2019-01-02 DIAGNOSIS — N136 Pyonephrosis: Secondary | ICD-10-CM | POA: Diagnosis present

## 2019-01-02 HISTORY — DX: Heart failure, unspecified: I50.9

## 2019-01-02 LAB — URINALYSIS, COMPLETE (UACMP) WITH MICROSCOPIC
Bilirubin Urine: NEGATIVE
Glucose, UA: 50 mg/dL — AB
Ketones, ur: NEGATIVE mg/dL
Nitrite: NEGATIVE
Protein, ur: 100 mg/dL — AB
RBC / HPF: >50 RBC/hpf — ABNORMAL HIGH (ref 0–5)
Specific Gravity, Urine: 1.014 (ref 1.005–1.030)
Squamous Epithelial / HPF: NONE SEEN (ref 0–5)
pH: 5 (ref 5.0–8.0)

## 2019-01-02 LAB — LACTIC ACID, PLASMA
Lactic Acid, Venous: 3 mmol/L (ref 0.5–1.9)
Lactic Acid, Venous: 1.4 mmol/L (ref 0.5–1.9)

## 2019-01-02 LAB — CBC WITH DIFFERENTIAL/PLATELET
Abs Immature Granulocytes: 0.14 10*3/uL — ABNORMAL HIGH (ref 0.00–0.07)
Basophils Absolute: 0.1 10*3/uL (ref 0.0–0.1)
Basophils Relative: 1 %
Eosinophils Absolute: 0.2 10*3/uL (ref 0.0–0.5)
Eosinophils Relative: 2 %
HCT: 25.4 % — ABNORMAL LOW (ref 39.0–52.0)
Hemoglobin: 7.7 g/dL — ABNORMAL LOW (ref 13.0–17.0)
Immature Granulocytes: 2 %
Lymphocytes Relative: 17 %
Lymphs Abs: 1.6 10*3/uL (ref 0.7–4.0)
MCH: 26.3 pg (ref 26.0–34.0)
MCHC: 30.3 g/dL (ref 30.0–36.0)
MCV: 86.7 fL (ref 80.0–100.0)
Monocytes Absolute: 0.6 10*3/uL (ref 0.1–1.0)
Monocytes Relative: 7 %
Neutro Abs: 6.7 10*3/uL (ref 1.7–7.7)
Neutrophils Relative %: 71 %
Platelets: 144 10*3/uL — ABNORMAL LOW (ref 150–400)
RBC: 2.93 MIL/uL — ABNORMAL LOW (ref 4.22–5.81)
RDW: 15.9 % — ABNORMAL HIGH (ref 11.5–15.5)
WBC: 9.3 10*3/uL (ref 4.0–10.5)
nRBC: 0 % (ref 0.0–0.2)

## 2019-01-02 LAB — BASIC METABOLIC PANEL
Anion gap: 11 (ref 5–15)
BUN: 77 mg/dL — ABNORMAL HIGH (ref 8–23)
CO2: 25 mmol/L (ref 22–32)
Calcium: 8.3 mg/dL — ABNORMAL LOW (ref 8.9–10.3)
Chloride: 99 mmol/L (ref 98–111)
Creatinine, Ser: 4.85 mg/dL — ABNORMAL HIGH (ref 0.61–1.24)
GFR calc Af Amer: 11 mL/min — ABNORMAL LOW (ref >60)
GFR calc non Af Amer: 10 mL/min — ABNORMAL LOW (ref >60)
Glucose, Bld: 157 mg/dL — ABNORMAL HIGH (ref 70–99)
Potassium: 3.3 mmol/L — ABNORMAL LOW (ref 3.5–5.1)
Sodium: 135 mmol/L (ref 135–145)

## 2019-01-02 LAB — COMPREHENSIVE METABOLIC PANEL
ALT: 20 U/L (ref 0–44)
AST: 25 U/L (ref 15–41)
Albumin: 3.1 g/dL — ABNORMAL LOW (ref 3.5–5.0)
Alkaline Phosphatase: 63 U/L (ref 38–126)
Anion gap: 16 — ABNORMAL HIGH (ref 5–15)
BUN: 76 mg/dL — ABNORMAL HIGH (ref 8–23)
CO2: 22 mmol/L (ref 22–32)
Calcium: 8.6 mg/dL — ABNORMAL LOW (ref 8.9–10.3)
Chloride: 100 mmol/L (ref 98–111)
Creatinine, Ser: 4.95 mg/dL — ABNORMAL HIGH (ref 0.61–1.24)
GFR calc Af Amer: 11 mL/min — ABNORMAL LOW (ref >60)
GFR calc non Af Amer: 9 mL/min — ABNORMAL LOW (ref >60)
Glucose, Bld: 190 mg/dL — ABNORMAL HIGH (ref 70–99)
Potassium: 3.2 mmol/L — ABNORMAL LOW (ref 3.5–5.1)
Sodium: 138 mmol/L (ref 135–145)
Total Bilirubin: 0.3 mg/dL (ref 0.3–1.2)
Total Protein: 8.2 g/dL — ABNORMAL HIGH (ref 6.5–8.1)

## 2019-01-02 LAB — GLUCOSE, CAPILLARY
Glucose-Capillary: 151 mg/dL — ABNORMAL HIGH (ref 70–99)
Glucose-Capillary: 148 mg/dL — ABNORMAL HIGH (ref 70–99)
Glucose-Capillary: 165 mg/dL — ABNORMAL HIGH (ref 70–99)

## 2019-01-02 LAB — LIPASE, BLOOD: Lipase: 47 U/L (ref 11–51)

## 2019-01-02 LAB — PROTIME-INR
INR: 1.3 — ABNORMAL HIGH (ref 0.8–1.2)
Prothrombin Time: 15.8 seconds — ABNORMAL HIGH (ref 11.4–15.2)

## 2019-01-02 LAB — SARS CORONAVIRUS 2 BY RT PCR (HOSPITAL ORDER, PERFORMED IN ~~LOC~~ HOSPITAL LAB): SARS Coronavirus 2: NEGATIVE

## 2019-01-02 LAB — APTT: aPTT: 34 seconds (ref 24–36)

## 2019-01-02 LAB — PROCALCITONIN: Procalcitonin: 0.12 ng/mL

## 2019-01-02 MED ORDER — SODIUM CHLORIDE 0.9 % IV SOLN: 1.0000 g | INTRAVENOUS | 0 refills | Status: DC

## 2019-01-02 MED ORDER — VANCOMYCIN HCL IN DEXTROSE 1-5 GM/200ML-% IV SOLN
1000.0000 mg | Freq: Once | INTRAVENOUS | Status: AC
  Administered 2019-01-02: 1000 mg via INTRAVENOUS
  Filled 2019-01-02: qty 200

## 2019-01-02 MED ORDER — SODIUM CHLORIDE 0.9 % IV BOLUS
1000.0000 mL | Freq: Once | INTRAVENOUS | Status: AC
  Administered 2019-01-02: 1000 mL via INTRAVENOUS

## 2019-01-02 MED ORDER — IOPAMIDOL (ISOVUE-300) INJECTION 61%: 10.0000 mL | Freq: Once | INTRAVENOUS | Status: DC | PRN

## 2019-01-02 MED ORDER — METRONIDAZOLE IN NACL 5-0.79 MG/ML-% IV SOLN
500.0000 mg | Freq: Once | INTRAVENOUS | Status: AC
  Administered 2019-01-02: 500 mg via INTRAVENOUS
  Filled 2019-01-02: qty 100

## 2019-01-02 MED ORDER — IOHEXOL 300 MG/ML  SOLN
10.0000 mL | Freq: Once | INTRAMUSCULAR | Status: AC | PRN
  Administered 2019-01-02: 10 mL

## 2019-01-02 MED ORDER — VANCOMYCIN VARIABLE DOSE PER UNSTABLE RENAL FUNCTION (PHARMACIST DOSING): Status: DC

## 2019-01-02 MED ORDER — ONDANSETRON HCL 4 MG/2ML IJ SOLN
INTRAMUSCULAR | Status: AC
  Administered 2019-01-02: 4 mg via INTRAVENOUS
  Filled 2019-01-02: qty 2

## 2019-01-02 MED ORDER — ONDANSETRON HCL 4 MG/2ML IJ SOLN
4.0000 mg | Freq: Once | INTRAMUSCULAR | Status: AC
  Administered 2019-01-02: 4 mg via INTRAVENOUS

## 2019-01-02 NOTE — ED Provider Notes (Signed)
[facility] Emergency Department Provider Note  ____________________________________________  Time seen: Approximately 10:03 PM  I have reviewed the triage vital signs and the nursing notes.   HISTORY  Chief Complaint Seizures    Level 5 Caveat: Portions of the History and Physical including HPI and review of systems are unable to be completely obtained due to patient being a poor historian due to altered mental status  HPI Blake [name] is a 85 y.o. male with a history of AAA, CHF, diabetes, hypertension, pacemaker, atrial fibrillation who was recently in the hospital due to sepsis with cultures demonstrating bacteremia from Pseudomonas.  Patient was found to have urinary obstruction likely from bladder cancer, so nephrostomy tubes were placed bilaterally.   He has a PICC line and is being continued on IV cefepime.  Patient was discharged from the hospital today, but on arrival home he had a seizure in the car.  EMS was called and found the patient to have atrial fibrillation with a rate between 101 60.  They witnessed another seizure on scene.  Patient has been somnolent prior to arrival in the ED.  Only patient is ambulatory with a walker and conversant.     Past Medical History:  Diagnosis Date  . AAA (abdominal aortic aneurysm) ([place])   . Arrhythmia   . CHF (congestive heart failure) ([place])   . Diabetes mellitus without complication ([place])   . GI bleed   . Heart murmur   . Hyperlipidemia   . Hypertension   . Pacemaker   . Prostate cancer [facility])      Patient Active Problem List   Diagnosis Date Noted  . Acute CHF (congestive heart failure) ([place]) 12/25/2018  . Acute renal failure (ARF) ([place]) 11/21/2018  . AKI (acute kidney injury) ([place])   . Goals of care, counseling/discussion   . Palliative care by specialist   . DNR (do not resuscitate) discussion   . Seizure-like activity ([place]) 11/05/2018  . Allergic rhinitis 07/24/2018  . Cardiac pacemaker  in situ 07/24/2018  . History of sleep apnea 07/24/2018  . Macular degeneration 07/24/2018  . Trigeminal neuralgia pain 01/11/2018  . TMJ pain dysfunction syndrome 01/11/2018  . Recurrent UTI 09/26/2017  . Urothelial carcinoma of bladder ([place]) 09/26/2017  . Pressure injury of skin 09/12/2017  . SIRS (systemic inflammatory response syndrome) ([place]) 09/10/2017  . Chronic cystitis 08/25/2017  . Sepsis ([place]) 06/10/2017  . CAP (community acquired pneumonia) 06/10/2017  . Infection due to enterococcus 06/07/2017  . UTI (urinary tract infection) 05/17/2017  . Blood in stool   . Hemorrhage of rectum and anus   . Lower GI bleed 10/20/2016  . Near syncope   . Acute renal failure superimposed on chronic kidney disease ([place])   . Pneumonia 10/09/2016  . Aspiration pneumonia ([place]) 10/09/2016  . Acute respiratory failure ([place]) 10/09/2016  . Hydronephrosis, bilateral 07/19/2016  . Spinal stenosis of lumbar region with neurogenic claudication 07/19/2016  . AAA (abdominal aortic aneurysm) without rupture ([place]) 04/27/2016  . Arthritis, degenerative 03/31/2016  . A-fib ([place]) 03/31/2016  . HLD (hyperlipidemia) 03/31/2016  . CA of prostate ([place]) 03/31/2016  . Central perforation of tympanic membrane of right ear 03/26/2016  . 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 Presbycusis of left ear with restricted hearing of right ear 03/01/2016  . Chronic kidney disease, stage 3 (moderate) ([place]) 10/30/2015  . Episode of unresponsiveness 10/30/2015  . Sick sinus syndrome ([place])  10/30/2015  . Malignant neoplasm of overlapping sites of bladder ([place]) 09/26/2015  . Urinary retention 09/17/2015  . Bladder neoplasm of uncertain malignant potential 08/13/2015  . Thrombocytopenia, unspecified ([place]) 07/24/2015  . Fitting or adjustment of cardiac pacemaker 05/09/2015  . SVT (supraventricular tachycardia) ([place]) 04/09/2015  . Diabetes mellitus, type 2  ([place]) 04/09/2015  . Benign hypertension 04/09/2015  . Acute myocardial infarction, initial episode of care ([place]) 04/09/2015  . Phimosis 01/30/2014  . Bladder outlet obstruction 04/18/2013  . Dysuria 04/18/2013  . Gross hematuria 04/18/2013  . Urethral valve, acquired 04/18/2013  . Incomplete emptying of bladder 11/26/2012  . Personal history of prostate cancer 11/26/2012     Past Surgical History:  Procedure Laterality Date  . FLEXIBLE SIGMOIDOSCOPY N/A 10/21/2016   Procedure: FLEXIBLE SIGMOIDOSCOPY;  Surgeon: [name], MD;  Location: [facility] ENDOSCOPY;  Service: Endoscopy;  Laterality: N/A;  . IR NEPHROSTOMY PLACEMENT RIGHT  01/01/2019  . JOINT REPLACEMENT    . NEPHROSTOMY Bilateral   . PACEMAKER INSERTION Left 04/14/2015   Procedure: INSERTION PACEMAKER;  Surgeon: Isaias Cowman, MD;  Location: [facility] ORS;  Service: Cardiovascular;  Laterality: Left;  . PROSTATE SURGERY    . VISCERAL ARTERY INTERVENTION N/A 10/22/2016   Procedure: Visceral Artery Intervention;  Surgeon: Algernon Huxley, MD;  Location: [facility];  Service: Cardiovascular;  Laterality: N/A;     Prior to Admission medications   Medication Sig Start Date End Date Taking? Authorizing Provider  amLODipine (NORVASC) 2.5 MG tablet Take 1 tablet (2.5 mg total) by mouth daily. 11/10/18  Yes Sudini, Alveta Heimlich, MD  ceFEPIme 1 g in sodium chloride 0.9 % 100 mL Inject 1 g into the vein daily for 16 days. 01/03/19 01/19/19 Yes Fritzi Mandes, MD  feeding supplement, GLUCERNA SHAKE, (GLUCERNA SHAKE) LIQD Take 237 mLs by mouth 2 (two) times daily between meals. 01/01/19  Yes Fritzi Mandes, MD  finasteride (PROSCAR) 5 MG tablet Take 5 mg by mouth daily.   Yes [provider]  Iron-Vitamin C (VITRON-C PO) Take 1 tablet by mouth daily.   Yes [provider]  metoprolol tartrate 37.5 MG TABS Take 37.5 mg by mouth 2 (two) times daily. 09/13/17  Yes Gladstone Lighter, MD  Multiple Vitamins-Minerals (PRESERVISION/LUTEIN PO)  Take 1 capsule by mouth 2 (two) times daily.   Yes [provider]  multivitamin-iron-minerals-folic acid (CENTRUM) chewable tablet Chew 2 tablets by mouth daily.   Yes [provider]  pantoprazole (PROTONIX) 40 MG tablet Take 40 mg by mouth daily.   Yes [provider]  rosuvastatin (CRESTOR) 5 MG tablet Take 5 mg by mouth at bedtime.   Yes [provider]  sodium bicarbonate 650 MG tablet Take 1 tablet (650 mg total) by mouth 2 (two) times daily. 09/13/17  Yes Gladstone Lighter, MD  tamsulosin (FLOMAX) 0.4 MG CAPS capsule Take 0.4 mg by mouth daily.    Yes [provider]  sitaGLIPtin (JANUVIA) 50 MG tablet Take 0.5 tablets (25 mg total) by mouth daily. 01/01/19   Fritzi Mandes, MD     Allergies Diovan [valsartan], Gabapentin, Hydralazine, Lisinopril, Pregabalin, Levofloxacin, and Sulfamethoxazole-trimethoprim   Family History  Problem Relation Age of Onset  . Hypertension Other   . Stroke Other   . Heart attack Other   . Stroke Sister   . Heart attack Brother     Social History Social History   Tobacco Use  . Smoking status: Former Research scientist (life sciences)  . Smokeless tobacco: Never Used  Substance Use Topics  .  Alcohol use: No  . Drug use: No    Review of Systems Level 5 Caveat: Portions of the History and Physical including HPI and review of systems are unable to be completely obtained due to patient being a poor historian   Constitutional:   No known fever.  ENT:   No rhinorrhea. Cardiovascular:   No chest pain or syncope. Respiratory:   No dyspnea or cough. Gastrointestinal:   Negative for abdominal pain, positive vomiting.  Musculoskeletal:   Negative for focal pain or swelling ____________________________________________   PHYSICAL EXAM:  VITAL SIGNS: ED Triage Vitals  Enc Vitals Group     BP 01/02/19 1958 107/66     Pulse Rate 01/02/19 2000 (!) 126     Resp 01/02/19 2000 (!) 29     Temp 01/02/19 2000 (!) 97.5 F (36.4 C)      Temp Source 01/02/19 2000 Axillary     SpO2 01/02/19 2000 (!) 89 %     Weight --      Height --      Head Circumference --      Peak Flow --      Pain Score 01/02/19 2002 0     Pain Loc --      Pain Edu? --      Excl. in [place]? --     Vital signs reviewed, nursing assessments reviewed.   Constitutional:   Somnolent, not oriented, ill-appearing.. Eyes:   Conjunctivae are normal. EOMI not testable due to patient's inability to participate. PERRL. ENT      Head:   Normocephalic and atraumatic.      Nose:   No congestion/rhinnorhea.       Mouth/Throat:   Dry mucous membranes, no pharyngeal erythema. No peritonsillar mass.       Neck:   No meningismus. Full ROM. Hematological/Lymphatic/Immunilogical:   No cervical lymphadenopathy. Cardiovascular:   Irregularly irregular rhythm, rate of 120. Symmetric bilateral radial and DP pulses.  No murmurs. Cap refill less than 2 seconds. Respiratory:   Tachypnea, normal work of breathing.. Breath sounds are clear and equal bilaterally. No wheezes/rales/rhonchi. Gastrointestinal:   Soft and nontender. Non distended. There is no CVA tenderness.  No rebound, rigidity, or guarding. Musculoskeletal:   Normal range of motion in all extremities. No joint effusions.  No lower extremity tenderness.  No edema. Neurologic:   Currently no verbal expressions.  Follows commands, but very poor motor effort. GCS equals E2 V1 M6 = 9 Skin:    Skin is warm, dry and intact. No rash noted.  No petechiae, purpura, or bullae.  ____________________________________________    LABS (pertinent positives/negatives) (all labs ordered are listed, but only abnormal results are displayed) Labs Reviewed  COMPREHENSIVE METABOLIC PANEL - Abnormal; Notable for the following components:      Result Value   Potassium 3.2 (*)    Glucose, Bld 190 (*)    BUN 76 (*)    Creatinine, Ser 4.95 (*)    Calcium 8.6 (*)    Total Protein 8.2 (*)    Albumin 3.1 (*)    GFR calc non Af Amer  9 (*)    GFR calc Af Amer 11 (*)    Anion gap 16 (*)    All other components within normal limits  CBC WITH DIFFERENTIAL/PLATELET - Abnormal; Notable for the following components:   RBC 2.93 (*)    Hemoglobin 7.7 (*)    HCT 25.4 (*)    RDW 15.9 (*)    Platelets  144 (*)    Abs Immature Granulocytes 0.14 (*)    All other components within normal limits  LACTIC ACID, PLASMA - Abnormal; Notable for the following components:   Lactic Acid, Venous 3.0 (*)    All other components within normal limits  PROTIME-INR - Abnormal; Notable for the following components:   Prothrombin Time 15.8 (*)    INR 1.3 (*)    All other components within normal limits  URINALYSIS, COMPLETE (UACMP) WITH MICROSCOPIC - Abnormal; Notable for the following components:   Color, Urine YELLOW (*)    APPearance CLOUDY (*)    Glucose, UA 50 (*)    Hgb urine dipstick LARGE (*)    Protein, ur 100 (*)    Leukocytes,Ua MODERATE (*)    RBC / HPF >50 (*)    Bacteria, UA RARE (*)    All other components within normal limits  SARS CORONAVIRUS 2 (HOSPITAL ORDER, [facility])  CULTURE, BLOOD (ROUTINE X 2)  CULTURE, BLOOD (ROUTINE X 2)  URINE CULTURE  LIPASE, BLOOD  APTT  LACTIC ACID, PLASMA  PROCALCITONIN  CREATININE, SERUM   ____________________________________________   EKG  Interpreted by me Atrial fibrillation, rate of 134, left axis, normal intervals.  Right bundle branch block.  No acute ischemic changes.  2 PVCs on the strip.  Repeat EKG shows persistent atrial fibrillation, rate of 135, no acute ischemic changes.  ____________________________________________    VHQIONGEX  Dg Chest Portable 1 View  Result Date: 01/02/2019 CLINICAL DATA:  85 year old male with cough. EXAM: PORTABLE CHEST 1 VIEW COMPARISON:  Chest radiograph dated 12/25/2018 FINDINGS: Right-sided PICC with tip over the mediastinum at the level of the aortic arch. There is diffuse chronic interstitial  coarsening. Bibasilar linear atelectasis/scarring noted. There has been interval clearing of the previously seen bibasilar densities. No focal consolidation, pleural effusion, or pneumothorax. Stable cardiac silhouette. Left pectoral pacemaker device. Atherosclerotic calcification of the aorta. Osteopenia with degenerative changes of the spine. No acute osseous pathology. IMPRESSION: 1. No acute cardiopulmonary process. 2. Right-sided PICC with tip over the mediastinum at the level of the aortic arch. Electronically Signed   By: Anner Crete M.D.   On: 01/02/2019 20:41   Dg Nephrostogram Right Thru Existing Access  Result Date: 01/02/2019 INDICATION: Bilateral nephrostomies, right nephrostomy bloody output EXAM: RIGHT NEPHROSTOGRAM THROUGH EXISTING NEPHROSTOMY COMPARISON:  12/29/2018, 01/01/2019 MEDICATIONS: None. ANESTHESIA/SEDATION: Moderate Sedation Time:  None. The patient was continuously monitored during the procedure by the interventional radiology nurse under my direct supervision. CONTRAST:  20 cc dilute Omnipaque 300-administered into the collecting system(s) FLUOROSCOPY TIME:  Fluoroscopy Time: 0 minutes 24 seconds (9 mGy). COMPLICATIONS: None immediate. PROCEDURE: Informed written consent was obtained from the patient after a thorough discussion of the procedural risks, benefits and alternatives. All questions were addressed. Maximal Sterile Barrier Technique was utilized including caps, mask, sterile gowns, sterile gloves, sterile drape, hand hygiene and skin antiseptic. A timeout was performed prior to the initiation of the procedure. Under sterile conditions, the existing right nephrostomy was injected with contrast. Fluoroscopic imaging performed. Nephrostomy retention loop in the renal pelvis in good position. Mild hydronephrosis noted. No evidence of contrast extravasation or communication to any vascular source. No obvious site of bleeding. IMPRESSION: Right nephrostomy in good position.  No signs of active bleeding or contrast extravasation. Electronically Signed   By: Jerilynn Mages.  Shick M.D.   On: 01/02/2019 15:15    ____________________________________________   PROCEDURES .Critical Care Performed by: Carrie Mew, MD Authorized  by: Carrie Mew, MD   Critical care provider statement:    Critical care time (minutes):  35   Critical care time was exclusive of:  Separately billable procedures and treating other patients   Critical care was necessary to treat or prevent imminent or life-threatening deterioration of the following conditions:  CNS failure or compromise, dehydration, metabolic crisis and sepsis   Critical care was time spent personally by me on the following activities:  Development of treatment plan with patient or surrogate, discussions with consultants, evaluation of patient's response to treatment, examination of patient, obtaining history from patient or surrogate, ordering and performing treatments and interventions, ordering and review of laboratory studies, ordering and review of radiographic studies, pulse oximetry, re-evaluation of patient's condition and review of old charts    ____________________________________________  DIFFERENTIAL DIAGNOSIS   Intracranial hemorrhage, stroke, electrolyte abnormality/metabolic encephalopathy, dehydration, complication of nephrostomy tube placement  CLINICAL IMPRESSION / ASSESSMENT AND PLAN / ED COURSE  Pertinent labs & imaging results that were available during my care of the patient were reviewed by me and considered in my medical decision making (see chart for details).   [name] was evaluated in Emergency Department on 01/02/2019 for the symptoms described in the history of present illness. He was evaluated in the context of the global COVID-19 pandemic, which necessitated consideration that the patient might be at risk for infection with the SARS-CoV-2 virus that causes COVID-19. Institutional  protocols and algorithms that pertain to the evaluation of patients at risk for COVID-19 are in a state of rapid change based on information released by regulatory bodies including the CDC and federal and state organizations. These policies and algorithms were followed during the patient's care in the ED.   Possible superinfection in addition to his recent Pseudomonas bacteremia due to invasive procedures and instrumentation during recent hospitalization.  We will add on vancomycin and Flagyl in addition to the cefepime he is already receiving.  Code sepsis initiated.  Clinical Course as of Jan 01 2342  Tue Jan 02, 2019  2004 Patient was admitted June 15 with sepsis related to Pseudomonas bacteremia hydronephrosis and bladder obstruction from cancer.  On cefepime IV via PIC.  June 15 he tested negative for COVID.  He now presents with lethargy, tachypnea, borderline hypoxia, A. fib with tachycardia.  Will initiate code sepsis, check chest x-ray labs.  Will need to scan his abdomen to look for complications of nephrostomy tubes.  Repeat COVID test.  Patient will need to be hospitalized again.   [PS]  2334 Daughter updated, agrees with plan to hospitalize. Pt more alert but still ill appearing and low energy. Awaiting imaging.    [PS]    Clinical Course User Index [PS] Carrie Mew, MD     ----------------------------------------- 11:43 PM on 01/02/2019 -----------------------------------------  COVID negative  ____________________________________________   FINAL CLINICAL IMPRESSION(S) / ED DIAGNOSES    Final diagnoses:  Seizure ([place])  Disorientation  CKD (chronic kidney disease) stage 5, GFR less than 15 ml/min [facility])     ED Discharge Orders    None      Portions of this note were generated with dragon dictation software. Dictation errors may occur despite best attempts at proofreading.   Carrie Mew, MD 01/02/19 [phone]

## 2019-01-02 NOTE — ED Notes (Signed)
Seizure pads in place

## 2019-01-02 NOTE — Progress Notes (Signed)
Urology Consult Follow Up  Subjective: Seen this morning and was sleeping. Foley catheter was removed yesterday.  Anti-infectives: Anti-infectives (From admission, onward)   Start     Dose/Rate Route Frequency Ordered Stop   12/27/18 1000  ceFEPIme (MAXIPIME) 1 g in sodium chloride 0.9 % 100 mL IVPB     1 g 200 mL/hr over 30 Minutes Intravenous Every 24 hours 12/26/18 0839     12/26/18 0900  cefTRIAXone (ROCEPHIN) 1 g in sodium chloride 0.9 % 100 mL IVPB  Status:  Discontinued     1 g 200 mL/hr over 30 Minutes Intravenous Every 24 hours 12/25/18 0910 12/26/18 0354   12/26/18 0900  azithromycin (ZITHROMAX) 500 mg in sodium chloride 0.9 % 250 mL IVPB  Status:  Discontinued     500 mg 250 mL/hr over 60 Minutes Intravenous Every 24 hours 12/25/18 0910 12/26/18 0354   12/26/18 0400  ceFEPIme (MAXIPIME) 2 g in sodium chloride 0.9 % 100 mL IVPB  Status:  Discontinued     2 g 200 mL/hr over 30 Minutes Intravenous Every 12 hours 12/26/18 0354 12/26/18 0839   12/25/18 0830  cefTRIAXone (ROCEPHIN) 1 g in sodium chloride 0.9 % 100 mL IVPB     1 g 200 mL/hr over 30 Minutes Intravenous  Once 12/25/18 0815 12/25/18 0936   12/25/18 0830  azithromycin (ZITHROMAX) 500 mg in sodium chloride 0.9 % 250 mL IVPB     500 mg 250 mL/hr over 60 Minutes Intravenous  Once 12/25/18 0815 12/25/18 1127      Current Facility-Administered Medications  Medication Dose Route Frequency Provider Last Rate Last Dose  . 0.9 %  sodium chloride infusion  250 mL Intravenous PRN Dustin Flock, MD      . acetaminophen (TYLENOL) tablet 650 mg  650 mg Oral Q6H PRN Dustin Flock, MD   650 mg at 12/28/18 0935   Or  . acetaminophen (TYLENOL) suppository 650 mg  650 mg Rectal Q6H PRN Dustin Flock, MD      . amLODipine (NORVASC) tablet 2.5 mg  2.5 mg Oral Daily Dustin Flock, MD   2.5 mg at 01/01/19 0855  . ceFEPIme (MAXIPIME) 1 g in sodium chloride 0.9 % 100 mL IVPB  1 g Intravenous Q24H Dustin Flock, MD 200 mL/hr at  01/02/19 1021 1 g at 01/02/19 1021  . feeding supplement (GLUCERNA SHAKE) (GLUCERNA SHAKE) liquid 237 mL  237 mL Oral BID BM Fritzi Mandes, MD   237 mL at 01/01/19 0905  . finasteride (PROSCAR) tablet 5 mg  5 mg Oral Daily Dustin Flock, MD   5 mg at 01/01/19 0855  . insulin aspart (novoLOG) injection 0-5 Units  0-5 Units Subcutaneous QHS Dustin Flock, MD   2 Units at 12/30/18 2230  . insulin aspart (novoLOG) injection 0-9 Units  0-9 Units Subcutaneous TID WC Dustin Flock, MD   2 Units at 01/01/19 1303  . metoprolol tartrate (LOPRESSOR) tablet 50 mg  50 mg Oral BID Dustin Flock, MD   50 mg at 01/01/19 2323  . multivitamin with minerals tablet 1 tablet  1 tablet Oral Daily Fritzi Mandes, MD   1 tablet at 01/01/19 0855  . ondansetron (ZOFRAN) tablet 4 mg  4 mg Oral Q6H PRN Dustin Flock, MD       Or  . ondansetron (ZOFRAN) injection 4 mg  4 mg Intravenous Q6H PRN Dustin Flock, MD      . pantoprazole (PROTONIX) EC tablet 40 mg  40 mg Oral Daily Posey Pronto, [place],  MD   40 mg at 01/01/19 0855  . rosuvastatin (CRESTOR) tablet 5 mg  5 mg Oral QHS Dustin Flock, MD   5 mg at 01/01/19 2323  . sodium chloride flush (NS) 0.9 % injection 10-40 mL  10-40 mL Intracatheter Q12H Fritzi Mandes, MD   10 mL at 01/02/19 1025  . sodium chloride flush (NS) 0.9 % injection 10-40 mL  10-40 mL Intracatheter PRN Fritzi Mandes, MD   10 mL at 12/31/18 1049  . tamsulosin (FLOMAX) capsule 0.4 mg  0.4 mg Oral Daily Dustin Flock, MD   0.4 mg at 01/01/19 1655     Objective: Vital signs in last 24 hours: Temp:  [97.8 F (36.6 C)-98.4 F (36.9 C)] 97.8 F (36.6 C) (06/23 0738) Pulse Rate:  [59-100] 68 (06/23 0738) Resp:  [18-20] 18 (06/22 2018) BP: (101-109)/(70-79) 109/75 (06/23 0738) SpO2:  [97 %-100 %] 100 % (06/23 0738)  Intake/Output from previous day: 06/22 0701 - 06/23 0700 In: 760 [P.O.:720] Out: 1190 [Urine:1190] Intake/Output this shift: Total I/O In: 0  Out: 275 [Urine:275]   Physical Exam:  Sleeping, in no acute distress.  Right nephrostomy tube urine fairly bloody.  Lab Results:  Recent Labs    01/01/19 1449  HGB 8.3*   BMET Recent Labs    12/31/18 1009  NA 142  K 4.4  CL 102  CO2 27  GLUCOSE 155*  BUN 76*  CREATININE 4.98*  CALCIUM 8.8*     Assessment: 85 year old male with bilateral hydronephrosis and rising creatinine.  He is status post bilateral percutaneous nephrostomy tube placement.  His last creatinine on 6/21 was 4.98.  Creatinine today is pending.  His right nephrostomy output is fairly bloody and he is scheduled for a nephrostogram today. A PVR by bladder scan was performed today which was 6 mL.  Plan: Nephrostogram as above.  Hopefully his creatinine will continue to decrease.    LOS: 8 days    Abbie Sons 01/02/2019

## 2019-01-02 NOTE — Sepsis Progress Note (Signed)
!  st Lactic acid was at 2004 was 3.0,   The second Lactic acid was 1.4 at 2228.

## 2019-01-02 NOTE — ED Notes (Signed)
Compression socks removed per request of pt's daughter. Placed in pt belonging bag with house shoes, white socks, and blue pants. Bag at bedside

## 2019-01-02 NOTE — Plan of Care (Signed)
  Problem: Education: Goal: Knowledge of General Education information will improve Description: Including pain rating scale, medication(s)/side effects and non-pharmacologic comfort measures Outcome: Progressing   Problem: Safety: Goal: Ability to remain free from injury will improve Outcome: Progressing   Problem: Skin Integrity: Goal: Risk for impaired skin integrity will decrease Outcome: Progressing   Problem: Fluid Volume: Goal: Compliance with measures to maintain balanced fluid volume will improve Outcome: Progressing

## 2019-01-02 NOTE — Procedures (Signed)
Bloody output from rt pcn  S/p rt nephrostogram thru pcn  Tube in the renal pelvis No obvious bleeding source or extravasation Keep to gravity bag

## 2019-01-02 NOTE — TOC Transition Note (Signed)
Transition of Care [facility]) - CM/SW Discharge Note   Patient Details  Name: DELEON PASSE MRN: [id number] Date of Birth: [birth year]  Transition [facility] Care [facility]) CM/SW Contact:  Elza Rafter, RN Phone Number: 01/02/2019, 4:21 PM   Clinical Narrative:  Patient is discharging to home today with son.  Carolynn Sayers with [facility] and Corene Cornea with [place] are aware.       Final next level of care: [place] Barriers to Discharge: No Barriers Identified   Patient Goals and CMS Choice Patient states their goals for this hospitalization and ongoing recovery are:: [facility] CMS Medicare.gov Compare Post Acute Care list provided to:: Patient Represenative (must comment)(Daughter [name]) Choice offered to / list presented to : Adult Children(Rosalyn Primus Bravo DDS [phone])  Discharge Placement                       Discharge Plan and Services In-house Referral: Clinical Social Work Discharge Planning Services: CM Consult Post Acute Care Choice: Home Health                    HH Arranged: RN, PT, Nurse's Aide, Social Work [facility] Agency: [place] ([place]) Date [place]: 12/25/18 Time [place]: 1000 Representative spoke with at [place]: [employer] (SDOH) Interventions     Readmission Risk Interventions Readmission Risk Prevention Plan 12/30/2018 12/25/2018 11/24/2018  Transportation Screening Complete Complete Complete  PCP or Specialist Appt within 3-5 Days - - Complete  HRI or [place] - Complete Complete  Social Work Consult for [place] Planning/Counseling - Complete (No Data)  Palliative Care Screening - - Not Applicable  Medication Review Press photographer) Complete Complete Complete  HRI or Home Care Consult Complete - -  Some recent data might be hidden

## 2019-01-02 NOTE — ED Notes (Signed)
Daughter updated 

## 2019-01-02 NOTE — Progress Notes (Signed)
Discharge instructions explained to pts daughter and son at bedside/  Instructed on how to care and drain bilateral nephrostomy tube/ RX given to pt/ tele removed/ transported off unit via wheelchair.

## 2019-01-02 NOTE — ED Notes (Signed)
ptsleeping  No acute distress.   afib on monitor.  siderails up x 2 with seizure pads in place.  Iv in place.

## 2019-01-02 NOTE — Progress Notes (Signed)
[facility]  ROUNDING NOTE   Subjective:   Patient is resting comfortable.   Total urine output recorded about 1200 cc.   Left nephrostomy1000, right nephrostomy  165 cc Blood noted in the right nephrostomy tube,  left nephrostomy tube has clear urine 06/22 0701 - 06/23 0700 In: 760 [P.O.:720] Out: 1190 [Urine:1190]   Objective:  Vital signs in last 24 hours:  Temp:  [97.8 F (36.6 C)-98.4 F (36.9 C)] 97.8 F (36.6 C) (06/23 0738) Pulse Rate:  [59-100] 68 (06/23 0738) Resp:  [18-20] 18 (06/22 2018) BP: (101-109)/(70-79) 109/75 (06/23 0738) SpO2:  [97 %-100 %] 100 % (06/23 0738)  Weight change:  Filed Weights   12/25/18 0607  Weight: 81.6 kg    Intake/Output: I/O last 3 completed shifts: In: 61 [P.O.:720; Other:40] Out: 1790 [Urine:1790]   Intake/Output this shift:  Total I/O In: 0  Out: 275 [Urine:275]  Physical Exam: General: NAD, frail, elderly  Head: Normocephalic, atraumatic. Moist oral mucosal membranes  Eyes: Anicteric,   Neck: Supple,    Lungs:  Clear to auscultation  Heart: Regular rate and rhythm  Abdomen:   Soft, nontender  Extremities:  trace  peripheral edema.  Neurologic: Nonfocal, moving all four extremities  Skin: No lesions   GU Bilateral percutaneous nephrostomy tube, clear urine in left, blood-tinged urine in the right, indwelling foley catheter with bloody drainage    Basic Metabolic Panel: Recent Labs  Lab 12/27/18 0520 12/28/18 0337 12/29/18 0112 12/30/18 0655 12/31/18 1009  NA 144 144 142 146* 142  K 4.0 3.8 3.4* 3.8 4.4  CL 106 106 102 105 102  CO2 24 26 26 26 27   GLUCOSE 150* 158* 143* 149* 155*  BUN 75* 69* 69* 72* 76*  CREATININE 5.39* 5.28* 5.19* 5.07* 4.98*  CALCIUM 8.4* 8.4* 8.2* 8.8* 8.8*  PHOS  --  5.2*  --   --  5.0*    Liver Function Tests: Recent Labs  Lab 12/28/18 0337 12/31/18 1009  ALBUMIN 2.6* 2.9*   No results for input(s): LIPASE, AMYLASE in the last 168 hours. No results for  input(s): AMMONIA in the last 168 hours.  CBC: Recent Labs  Lab 12/27/18 0520 12/28/18 0337 12/29/18 0112 12/30/18 0655 01/01/19 1449  WBC 7.0 6.7 8.4 10.0  --   HGB 8.0* 8.1* 8.2* 9.0* 8.3*  HCT 26.3* 26.5* 26.7* 29.6*  --   MCV 86.8 86.9 86.4 88.9  --   PLT 131* 128* 134* 125*  --     Cardiac Enzymes: No results for input(s): CKTOTAL, CKMB, CKMBINDEX, TROPONINI in the last 168 hours.  BNP: Invalid input(s): POCBNP  CBG: Recent Labs  Lab 01/01/19 0746 01/01/19 1131 01/01/19 1647 01/01/19 2127 01/02/19 0740  GLUCAP 141* 166* 92 177* 151*    Microbiology: Results for orders placed or performed during the hospital encounter of 12/25/18  Culture, blood (routine x 2)     Status: None   Collection Time: 12/25/18  6:15 AM   Specimen: BLOOD  Result Value Ref Range Status   Specimen Description BLOOD LEFT ANTECUBITAL  Final   Special Requests   Final    BOTTLES DRAWN AEROBIC AND ANAEROBIC Blood Culture adequate volume   Culture   Final    NO GROWTH 5 DAYS Performed at [facility], [address], [place], [place] [address]    Report Status 12/30/2018 FINAL  Final  Culture, blood (routine x 2)     Status: Abnormal   Collection Time: 12/25/18  6:15  AM   Specimen: BLOOD  Result Value Ref Range Status   Specimen Description   Final    BLOOD RIGHT FOREARM Performed at [facility], [address] [place], [place] [address]    Special Requests   Final    BOTTLES DRAWN AEROBIC AND ANAEROBIC Blood Culture results may not be optimal due to an excessive volume of blood received in culture bottles Performed at [facility], [address], [place], [place] [address]    Culture  Setup Time   Final    GRAM NEGATIVE RODS AEROBIC BOTTLE ONLY CRITICAL RESULT CALLED TO, READ BACK BY AND VERIFIED WITH:  DAVID BESANTI PHARMD 0300 12/26/2018 HNM Performed at [facility], [place] [address] [place], [place] [address]    Culture PSEUDOMONAS  AERUGINOSA (A)  Final   Report Status 12/28/2018 FINAL  Final   Organism ID, Bacteria PSEUDOMONAS AERUGINOSA  Final      Susceptibility   Pseudomonas aeruginosa - MIC*    CEFTAZIDIME 4 SENSITIVE Sensitive     CIPROFLOXACIN 2 INTERMEDIATE Intermediate     GENTAMICIN <=1 SENSITIVE Sensitive     IMIPENEM 1 SENSITIVE Sensitive     PIP/TAZO 8 SENSITIVE Sensitive     CEFEPIME 2 SENSITIVE Sensitive     * PSEUDOMONAS AERUGINOSA  Urine culture     Status: Abnormal   Collection Time: 12/25/18  6:15 AM   Specimen: Urine, Random  Result Value Ref Range Status   Specimen Description   Final    URINE, RANDOM Performed at [facility], [address] [place], [place] [address]    Special Requests   Final    NONE Performed at [facility], [address] [place], [place] [address]    Culture >=100,000 COLONIES/mL PSEUDOMONAS AERUGINOSA (A)  Final   Report Status 12/28/2018 FINAL  Final   Organism ID, Bacteria PSEUDOMONAS AERUGINOSA (A)  Final      Susceptibility   Pseudomonas aeruginosa - MIC*    CEFTAZIDIME 4 SENSITIVE Sensitive     CIPROFLOXACIN 2 INTERMEDIATE Intermediate     GENTAMICIN <=1 SENSITIVE Sensitive     IMIPENEM 2 SENSITIVE Sensitive     PIP/TAZO 8 SENSITIVE Sensitive     CEFEPIME 2 SENSITIVE Sensitive     * >=100,000 COLONIES/mL PSEUDOMONAS AERUGINOSA  SARS Coronavirus 2 (CEPHEID- Performed in [place] hospital lab), Hosp Order     Status: None   Collection Time: 12/25/18  6:15 AM   Specimen: Nasopharyngeal  Result Value Ref Range Status   SARS Coronavirus 2 NEGATIVE NEGATIVE Final    Comment: (NOTE) If result is NEGATIVE SARS-CoV-2 target nucleic acids are NOT DETECTED. The SARS-CoV-2 RNA is generally detectable in upper and lower  respiratory specimens during the acute phase of infection. The lowest  concentration of SARS-CoV-2 viral copies this assay can detect is 250  copies / mL. A negative result does not preclude SARS-CoV-2 infection  and  should not be used as the sole basis for treatment or other  patient management decisions.  A negative result may occur with  improper specimen collection / handling, submission of specimen other  than nasopharyngeal swab, presence of viral mutation(s) within the  areas targeted by this assay, and inadequate number of viral copies  (<250 copies / mL). A negative result must be combined with clinical  observations, patient history, and epidemiological information. If result is POSITIVE SARS-CoV-2 target nucleic acids are DETECTED. The SARS-CoV-2 RNA is generally detectable in upper and  lower  respiratory specimens dur ing the acute phase of infection.  Positive  results are indicative of active infection with SARS-CoV-2.  Clinical  correlation with patient history and other diagnostic information is  necessary to determine patient infection status.  Positive results do  not rule out bacterial infection or co-infection with other viruses. If result is PRESUMPTIVE POSTIVE SARS-CoV-2 nucleic acids MAY BE PRESENT.   A presumptive positive result was obtained on the submitted specimen  and confirmed on repeat testing.  While 2019 novel coronavirus  (SARS-CoV-2) nucleic acids may be present in the submitted sample  additional confirmatory testing may be necessary for epidemiological  and / or clinical management purposes  to differentiate between  SARS-CoV-2 and other Sarbecovirus currently known to infect humans.  If clinically indicated additional testing with an alternate test  methodology [phone]) is advised. The SARS-CoV-2 RNA is generally  detectable in upper and lower respiratory sp ecimens during the acute  phase of infection. The expected result is Negative. Fact Sheet for Patients:  StrictlyIdeas.no Fact Sheet for Healthcare Providers: BankingDealers.co.za This test is not yet approved or cleared by the [place] FDA and has been  authorized for detection and/or diagnosis of SARS-CoV-2 by FDA under an Emergency Use Authorization (EUA).  This EUA will remain in effect (meaning this test can be used) for the duration of the COVID-19 declaration under Section 564(b)(1) of the Act, 21 U.S.C. section 360bbb-3(b)(1), unless the authorization is terminated or revoked sooner. Performed at [facility], [address] [place], [place] [address]   Blood Culture ID Panel (Reflexed)     Status: Abnormal   Collection Time: 12/25/18  6:15 AM  Result Value Ref Range Status   Enterococcus species NOT DETECTED NOT DETECTED Final   Listeria monocytogenes NOT DETECTED NOT DETECTED Final   Staphylococcus species NOT DETECTED NOT DETECTED Final   Staphylococcus aureus (BCID) NOT DETECTED NOT DETECTED Final   Streptococcus species NOT DETECTED NOT DETECTED Final   Streptococcus agalactiae NOT DETECTED NOT DETECTED Final   Streptococcus pneumoniae NOT DETECTED NOT DETECTED Final   Streptococcus pyogenes NOT DETECTED NOT DETECTED Final   Acinetobacter baumannii NOT DETECTED NOT DETECTED Final   Enterobacteriaceae species NOT DETECTED NOT DETECTED Final   Enterobacter cloacae complex NOT DETECTED NOT DETECTED Final   Escherichia coli NOT DETECTED NOT DETECTED Final   Klebsiella oxytoca NOT DETECTED NOT DETECTED Final   Klebsiella pneumoniae NOT DETECTED NOT DETECTED Final   Proteus species NOT DETECTED NOT DETECTED Final   Serratia marcescens NOT DETECTED NOT DETECTED Final   Carbapenem resistance NOT DETECTED NOT DETECTED Final   Haemophilus influenzae NOT DETECTED NOT DETECTED Final   Neisseria meningitidis NOT DETECTED NOT DETECTED Final   Pseudomonas aeruginosa DETECTED (A) NOT DETECTED Final    Comment: CRITICAL RESULT CALLED TO, READ BACK BY AND VERIFIED WITH: DAVID BESANTI PHARMD 0300 12/26/2018 HNM    Candida albicans NOT DETECTED NOT DETECTED Final   Candida glabrata NOT DETECTED NOT DETECTED Final   Candida  krusei NOT DETECTED NOT DETECTED Final   Candida parapsilosis NOT DETECTED NOT DETECTED Final   Candida tropicalis NOT DETECTED NOT DETECTED Final    Comment: Performed at [facility], [address] [place],  [address]  CULTURE, BLOOD (ROUTINE X 2) w Reflex to ID Panel     Status: None (Preliminary result)   Collection Time: 12/29/18  1:12 AM   Specimen: BLOOD  Result Value Ref Range Status   Specimen Description BLOOD  LEFT ANTECUBITAL  Final   Special Requests   Final    BOTTLES DRAWN AEROBIC AND ANAEROBIC Blood Culture adequate volume   Culture   Final    NO GROWTH 4 DAYS Performed at [facility], [address] [place], [place] [address]    Report Status PENDING  Incomplete  CULTURE, BLOOD (ROUTINE X 2) w Reflex to ID Panel     Status: None (Preliminary result)   Collection Time: 12/29/18  1:17 AM   Specimen: BLOOD  Result Value Ref Range Status   Specimen Description BLOOD RIGHT ANTECUBITAL  Final   Special Requests   Final    BOTTLES DRAWN AEROBIC AND ANAEROBIC Blood Culture adequate volume   Culture   Final    NO GROWTH 4 DAYS Performed at [facility], [address] [place], [place] [address]    Report Status PENDING  Incomplete    Coagulation Studies: No results for input(s): LABPROT, INR in the last 72 hours.  Urinalysis: No results for input(s): COLORURINE, LABSPEC, PHURINE, GLUCOSEU, HGBUR, BILIRUBINUR, KETONESUR, PROTEINUR, UROBILINOGEN, NITRITE, LEUKOCYTESUR in the last 72 hours.  Invalid input(s): APPERANCEUR    Imaging: Ct Abdomen Pelvis Wo Contrast  Result Date: 01/01/2019 CLINICAL DATA:  Status post right nephrostomy EXAM: CT ABDOMEN AND PELVIS WITHOUT CONTRAST TECHNIQUE: Multidetector CT imaging of the abdomen and pelvis was performed following the standard protocol without IV contrast. COMPARISON:  CT abdomen pelvis, 12/25/2018 FINDINGS: Lower chest: Improved bibasilar pleural effusions, now minimal. Associated  scarring or atelectasis. Coronary artery calcifications. Hepatobiliary: No solid liver abnormality is seen. No gallstones, gallbladder wall thickening, or biliary dilatation. Pancreas: Unremarkable. No pancreatic ductal dilatation or surrounding inflammatory changes. Spleen: Normal in size without significant abnormality. Adrenals/Urinary Tract: Adrenal glands are unremarkable. Interval placement of bilateral percutaneous nephrostomy tubes, with formed pigtails in the renal pelves and interval resolution of previously seen hydronephrosis. The urinary bladder remains decompressed by a Foley catheter. Brachytherapy pellets. Stomach/Bowel: Stomach is within normal limits. No evidence of bowel wall thickening, distention, or inflammatory changes. Generally large burden of stool in the colon with large stool ball in rectum. Pancolonic diverticulosis. 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 No enlarged abdominal or pelvic lymph nodes. Reproductive: Brachytherapy pellets. Other: No abdominal wall hernia or abnormality. No abdominopelvic ascites. 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 IMPRESSION: 1. Interval placement of bilateral percutaneous nephrostomy tubes, with formed pigtails in the renal pelves and interval resolution of previously seen hydronephrosis. The urinary bladder remains decompressed by a Foley catheter. 2.  Improved bilateral pleural effusions, now minimal. 3. Other chronic, incidental, and postoperative findings as detailed above. Electronically Signed   By: Eddie Candle M.D.   On: 01/01/2019 14:50   Ir Nephrostomy Placement Right  Result Date: 01/01/2019 INDICATION: 85 year old male with a history of bilateral ureteral occlusion EXAM: IR NEPHROSTOMY PLACEMENT RIGHT; ULTRAOUND INTRAOPERATIVE - NRPT [facility] COMPARISON:  None. MEDICATIONS: None ANESTHESIA/SEDATION: Fentanyl 50 mcg IV; Versed 1 mg IV  Moderate Sedation Time:  20 minutes The patient was continuously monitored during the procedure by the interventional radiology nurse under my direct supervision. CONTRAST:  10 cc-administered into the collecting system(s) FLUOROSCOPY TIME:  Fluoroscopy Time: 3 minutes 0 seconds COMPLICATIONS: None PROCEDURE: Informed written consent was obtained from the patient's family after a thorough discussion of the procedural risks, benefits and alternatives. All questions were addressed. Maximal Sterile Barrier Technique was utilized including caps, mask, sterile gowns, sterile gloves, sterile  drape, hand hygiene and skin antiseptic. A timeout was performed prior to the initiation of the procedure. Patient positioned prone position on the fluoroscopy table. Ultrasound survey of the right flank was performed with images stored and sent to PACs. The patient was then prepped and draped in the usual sterile fashion. 1% lidocaine was used to anesthetize the skin and subcutaneous tissues for local anesthesia. A Chiba needle was then used to access a posterior inferior calyx with ultrasound guidance. With spontaneous urine returned through the needle, passage of an 018 micro wire into the collecting system was performed under fluoroscopy. A small incision was made with an 11 blade scalpel, and the needle was removed from the wire. An Accustick system was then advanced over the wire into the collecting system under fluoroscopy. The metal stiffener and inner dilator were removed, and then a sample of fluid was aspirated through the 4 French outer sheath. Bentson wire was passed into the collecting system and the sheath removed. Ten French dilation of the soft tissues was performed. Using modified Seldinger technique, a 10 French pigtail catheter drain was placed over the Bentson wire. Wire and inner stiffener removed, and the pigtail was formed in the collecting system. Small amount of contrast confirmed position of the catheter.  Patient tolerated the procedure well and remained hemodynamically stable throughout. No complications were encountered and no significant blood loss encountered IMPRESSION: Status post right percutaneous nephrostomy. Signed, Dulcy Fanny. Dellia Nims, RPVI [facility] Radiology Electronically Signed   By: Corrie Mckusick D.O.   On: 01/01/2019 07:42     Medications:   . sodium chloride    . ceFEPime (MAXIPIME) IV 1 g (01/02/19 1021)   . amLODipine  2.5 mg Oral Daily  . feeding supplement (GLUCERNA SHAKE)  237 mL Oral BID BM  . finasteride  5 mg Oral Daily  . insulin aspart  0-5 Units Subcutaneous QHS  . insulin aspart  0-9 Units Subcutaneous TID WC  . metoprolol tartrate  50 mg Oral BID  . multivitamin with minerals  1 tablet Oral Daily  . pantoprazole  40 mg Oral Daily  . rosuvastatin  5 mg Oral QHS  . sodium chloride flush  10-40 mL Intracatheter Q12H  . tamsulosin  0.4 mg Oral Daily   sodium chloride, acetaminophen **OR** acetaminophen, ondansetron **OR** ondansetron (ZOFRAN) IV, sodium chloride flush  Assessment/ Plan:  [name] is a 85 y.o. black male with high-grade bladder cancer, incompletely resected tumor, severe bilateral hydronephrosis, history of prostate cancer, recurrent UTIs, atrial fibrillation, pacemaker/defibrillator, diabetes mellitus type II who is admitted to [facility] on 12/25/2018 for bilateral hydronephrosis  1. Acute renal failure with obstructive uropathy, bilateral hydronephrosis. With metabolic acidosis and hyponatremia.  On Chronic kidney disease stage IV.  Baseline creatinine 2.22, GFR of 28; 09/13/2018 Status post left percutaneous nephrostomy tube placement by IR on 6/16. Right placed on on 6/19.  - Appreciate urology input Serum creatinine trend Is improving slowly -Right nephrostomy is blood-tinged.  Will need imaging to rule out dislocation Results for GARLAND, HINCAPIE (MRN [id number]) as of 01/02/2019  17:08  12/28/2018 03:37 12/29/2018 01:12 12/30/2018 06:55 12/31/2018 10:09 01/02/2019 15:23  Creatinine 5.28 (H) 5.19 (H) 5.07 (H) 4.98 (H) 4.85 (H)   2. Hypernatremia: improved - hold furosemide  3.  Anemia of chronic kidney disease:  Lab Results  Component Value Date   HGB 8.3 (L) 01/01/2019    4. Urinary tract infection: blood cultures with pseudomonas -  cefepime.     LOS: 8 Immanuel Fedak 6/23/202010:54 AM

## 2019-01-02 NOTE — Progress Notes (Signed)
Date of Admission:  12/25/2018       Subjective: Sleepy  IMedications:  . amLODipine  2.5 mg Oral Daily  . feeding supplement (GLUCERNA SHAKE)  237 mL Oral BID BM  . finasteride  5 mg Oral Daily  . insulin aspart  0-5 Units Subcutaneous QHS  . insulin aspart  0-9 Units Subcutaneous TID WC  . metoprolol tartrate  50 mg Oral BID  . multivitamin with minerals  1 tablet Oral Daily  . pantoprazole  40 mg Oral Daily  . rosuvastatin  5 mg Oral QHS  . sodium chloride flush  10-40 mL Intracatheter Q12H  . tamsulosin  0.4 mg Oral Daily    Objective: Vital signs in last 24 hours: Temp:  [97.8 F (36.6 C)-98.4 F (36.9 C)] 97.8 F (36.6 C) (06/23 0738) Pulse Rate:  [59-100] 68 (06/23 0738) Resp:  [18-20] 18 (06/22 2018) BP: (101-109)/(70-79) 109/75 (06/23 0738) SpO2:  [97 %-100 %] 100 % (06/23 0738)  PHYSICAL EXAM:  General: awake but lethargic Son at bed side  rt nephrostomy tube- dark blood, left nephrostomy tube  . Lab Results Recent Labs    12/31/18 1009 01/01/19 1449  HGB  --  8.3*  NA 142  --   K 4.4  --   CL 102  --   CO2 27  --   BUN 76*  --   CREATININE 4.98*  --    Liver Panel Recent Labs    12/31/18 1009  ALBUMIN 2.9*   Sedimentation Rate No results for input(s): ESRSEDRATE in the last 72 hours. C-Reactive Protein No results for input(s): CRP in the last 72 hours.  Microbiology: 6/15 pseudomonas 6/19 BC neg Studies/Results: Ct Abdomen Pelvis Wo Contrast  Result Date: 01/01/2019 CLINICAL DATA:  Status post right nephrostomy EXAM: CT ABDOMEN AND PELVIS WITHOUT CONTRAST TECHNIQUE: Multidetector CT imaging of the abdomen and pelvis was performed following the standard protocol without IV contrast. COMPARISON:  CT abdomen pelvis, 12/25/2018 FINDINGS: Lower chest: Improved bibasilar pleural effusions, now minimal. Associated scarring or atelectasis. Coronary artery calcifications. Hepatobiliary: No solid liver abnormality is seen. No gallstones,  gallbladder wall thickening, or biliary dilatation. Pancreas: Unremarkable. No pancreatic ductal dilatation or surrounding inflammatory changes. Spleen: Normal in size without significant abnormality. Adrenals/Urinary Tract: Adrenal glands are unremarkable. Interval placement of bilateral percutaneous nephrostomy tubes, with formed pigtails in the renal pelves and interval resolution of previously seen hydronephrosis. The urinary bladder remains decompressed by a Foley catheter. Brachytherapy pellets. Stomach/Bowel: Stomach is within normal limits. No evidence of bowel wall thickening, distention, or inflammatory changes. Generally large burden of stool in the colon with large stool ball in rectum. Pancolonic diverticulosis. 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 No enlarged abdominal or pelvic lymph nodes. Reproductive: Brachytherapy pellets. Other: No abdominal wall hernia or abnormality. No abdominopelvic ascites. 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 IMPRESSION: 1. Interval placement of bilateral percutaneous nephrostomy tubes, with formed pigtails in the renal pelves and interval resolution of previously seen hydronephrosis. The urinary bladder remains decompressed by a Foley catheter. 2.  Improved bilateral pleural effusions, now minimal. 3. Other chronic, incidental, and postoperative findings as detailed above. Electronically Signed   By: Eddie Candle M.D.   On: 01/01/2019 14:50   Ir Nephrostomy Placement Right  Result Date: 01/01/2019 INDICATION: 85 year old male with a history of bilateral ureteral occlusion EXAM: IR NEPHROSTOMY PLACEMENT RIGHT; ULTRAOUND INTRAOPERATIVE - NRPT [facility] COMPARISON:  None. MEDICATIONS:  None ANESTHESIA/SEDATION: Fentanyl 50 mcg IV; Versed 1 mg IV Moderate Sedation Time:  20 minutes The patient was continuously monitored during the procedure by the interventional  radiology nurse under my direct supervision. CONTRAST:  10 cc-administered into the collecting system(s) FLUOROSCOPY TIME:  Fluoroscopy Time: 3 minutes 0 seconds COMPLICATIONS: None PROCEDURE: Informed written consent was obtained from the patient's family after a thorough discussion of the procedural risks, benefits and alternatives. All questions were addressed. Maximal Sterile Barrier Technique was utilized including caps, mask, sterile gowns, sterile gloves, sterile drape, hand hygiene and skin antiseptic. A timeout was performed prior to the initiation of the procedure. Patient positioned prone position on the fluoroscopy table. Ultrasound survey of the right flank was performed with images stored and sent to PACs. The patient was then prepped and draped in the usual sterile fashion. 1% lidocaine was used to anesthetize the skin and subcutaneous tissues for local anesthesia. A Chiba needle was then used to access a posterior inferior calyx with ultrasound guidance. With spontaneous urine returned through the needle, passage of an 018 micro wire into the collecting system was performed under fluoroscopy. A small incision was made with an 11 blade scalpel, and the needle was removed from the wire. An Accustick system was then advanced over the wire into the collecting system under fluoroscopy. The metal stiffener and inner dilator were removed, and then a sample of fluid was aspirated through the 4 French outer sheath. Bentson wire was passed into the collecting system and the sheath removed. Ten French dilation of the soft tissues was performed. Using modified Seldinger technique, a 10 French pigtail catheter drain was placed over the Bentson wire. Wire and inner stiffener removed, and the pigtail was formed in the collecting system. Small amount of contrast confirmed position of the catheter. Patient tolerated the procedure well and remained hemodynamically stable throughout. No complications were encountered  and no significant blood loss encountered IMPRESSION: Status post right percutaneous nephrostomy. Signed, Dulcy Fanny. Dellia Nims, RPVI [facility] Radiology Electronically Signed   By: Corrie Mckusick D.O.   On: 01/01/2019 07:42     Assessment/Plan: ?Pseudomonas bacteremia secondary to pseudomonas complicated UTI- on cefepime - repeat blood culture negative- will need atleast 3 weeks of IV as he has complicated UTI  B/l hydroureteronephrosis secondary to out flow obstruction/scarring/fibroiss and bladder ca- has b/l nephrostomy and rt one is oozing blood. Foley has been removed yesterday- watch for retention/residue as he has bladder outlet obstruction  AKI on CKD - secondary to the above  Bladder ca  CHF On frusemide  Pacemaker ? H/ prostate cancer- had surgery and radiation in the past  Spoke to his son at bed side

## 2019-01-02 NOTE — Progress Notes (Signed)
CODE SEPSIS - PHARMACY COMMUNICATION  **Broad Spectrum Antibiotics should be administered within 1 hour of Sepsis diagnosis**  Time Code Sepsis Called/Page Received: @2008   Antibiotics Ordered: Vancomycin and metronidazole  Time of 1st antibiotic administration: @2027   Additional action taken by pharmacy: N/A  If necessary, Name of Provider/Nurse Contacted: N/A    Rowland Lathe ,PharmD Clinical Pharmacist  01/02/2019  8:30 PM

## 2019-01-02 NOTE — ED Triage Notes (Signed)
PT to ED from home via EMS. PT was just d/c from hospital today after having nephrostomy tubes placed bilaterally. PT made it home, before getting out of the car had 1 seizure with 1x vomit. EMS called and they witnessed 1 seizure as well. EMS reports afib with ranges from 99-165. Per family, afib is new onset. PT also w/ hx of CHF, HTN, DM, pacemaker and kidney failure. Baseline Alert and ambulatory.

## 2019-01-02 NOTE — Progress Notes (Signed)
[facility] [place] NAME: Blake [name]    MR#:  [id number]  DATE OF BIRTH:  Feb 24, 1923  SUBJECTIVE:  Right nephrostomy oozing  REVIEW OF SYSTEMS:   Review of Systems  Constitutional: Negative for chills, fever and weight loss.  HENT: Negative for ear discharge, ear pain and nosebleeds.   Eyes: Negative for blurred vision, pain and discharge.  Respiratory: Negative for sputum production, shortness of breath, wheezing and stridor.   Cardiovascular: Negative for chest pain, palpitations, orthopnea and PND.  Gastrointestinal: Negative for abdominal pain, diarrhea, nausea and vomiting.  Genitourinary: Negative for frequency and urgency.  Musculoskeletal: Negative for back pain and joint pain.  Neurological: Positive for weakness. Negative for sensory change, speech change and focal weakness.  Psychiatric/Behavioral: Negative for depression and hallucinations. The patient is not nervous/anxious.    Tolerating Diet:yes  Tolerating PT: HHPT  DRUG ALLERGIES:   Allergies  Allergen Reactions  . Diovan [Valsartan] Cough  . Gabapentin Other (See Comments)    Sedation at all doses  . Hydralazine Itching  . Lisinopril Cough  . Pregabalin Other (See Comments)    Sedation at all doses  . Levofloxacin Other (See Comments)    Too many side effects. Nausea, vomiting, upset stomach, increased confusion, etc Other reaction(s): Confusion Nausea, chills Nausea, chills   . Sulfamethoxazole-Trimethoprim Other (See Comments)    Too many side effects. Nausea, vomiting, upset stomach, increased confusion, etc    VITALS:  Blood pressure 109/75, pulse 68, temperature 97.8 F (36.6 C), temperature source Oral, resp. rate 18, height 6\' 2"  (1.88 m), weight 81.6 kg, SpO2 100 %.  PHYSICAL EXAMINATION:   Physical Exam  GENERAL:  85 y.o.-year-old patient lying in the bed with no acute distress.  EYES: Pupils equal, round, reactive to light and  accommodation. No scleral icterus. Extraocular muscles intact.  HEENT: Head atraumatic, normocephalic. Oropharynx and nasopharynx clear.  NECK:  Supple, no jugular venous distention. No thyroid enlargement, no tenderness.  LUNGS: Normal breath sounds bilaterally, no wheezing, rales, rhonchi. No use of accessory muscles of respiration.  CARDIOVASCULAR: S1, S2 normal. No murmurs, rubs, or gallops.  ABDOMEN: Soft, nontender, nondistended. Bowel sounds present. No organomegaly or mass. Bilateral nephrostomy tubes right ++ blood and left urine EXTREMITIES: No cyanosis, clubbing or edema b/l.    NEUROLOGIC: Cranial nerves II through XII are intact. No focal Motor or sensory deficits b/l.  weak PSYCHIATRIC:  patient is alert and oriented x 2.  SKIN: No obvious rash, lesion, or ulcer.   LABORATORY PANEL:  CBC Recent Labs  Lab 12/30/18 0655 01/01/19 1449  WBC 10.0  --   HGB 9.0* 8.3*  HCT 29.6*  --   PLT 125*  --     Chemistries  Recent Labs  Lab 12/31/18 1009  NA 142  K 4.4  CL 102  CO2 27  GLUCOSE 155*  BUN 76*  CREATININE 4.98*  CALCIUM 8.8*   Cardiac Enzymes No results for input(s): TROPONINI in the last 168 hours. RADIOLOGY:  Ct Abdomen Pelvis Wo Contrast  Result Date: 01/01/2019 CLINICAL DATA:  Status post right nephrostomy EXAM: CT ABDOMEN AND PELVIS WITHOUT CONTRAST TECHNIQUE: Multidetector CT imaging of the abdomen and pelvis was performed following the standard protocol without IV contrast. COMPARISON:  CT abdomen pelvis, 12/25/2018 FINDINGS: Lower chest: Improved bibasilar pleural effusions, now minimal. Associated scarring or atelectasis. Coronary artery calcifications. Hepatobiliary: No solid liver abnormality is seen. No gallstones, gallbladder wall thickening, or biliary dilatation.  Pancreas: Unremarkable. No pancreatic ductal dilatation or surrounding inflammatory changes. Spleen: Normal in size without significant abnormality. Adrenals/Urinary Tract: Adrenal glands  are unremarkable. Interval placement of bilateral percutaneous nephrostomy tubes, with formed pigtails in the renal pelves and interval resolution of previously seen hydronephrosis. The urinary bladder remains decompressed by a Foley catheter. Brachytherapy pellets. Stomach/Bowel: Stomach is within normal limits. No evidence of bowel wall thickening, distention, or inflammatory changes. Generally large burden of stool in the colon with large stool ball in rectum. Pancolonic diverticulosis. 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 No enlarged abdominal or pelvic lymph nodes. Reproductive: Brachytherapy pellets. Other: No abdominal wall hernia or abnormality. No abdominopelvic ascites. 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 IMPRESSION: 1. Interval placement of bilateral percutaneous nephrostomy tubes, with formed pigtails in the renal pelves and interval resolution of previously seen hydronephrosis. The urinary bladder remains decompressed by a Foley catheter. 2.  Improved bilateral pleural effusions, now minimal. 3. Other chronic, incidental, and postoperative findings as detailed above. Electronically Signed   By: Eddie Candle M.D.   On: 01/01/2019 14:50   Ir Nephrostomy Placement Right  Result Date: 01/01/2019 INDICATION: 85 year old male with a history of bilateral ureteral occlusion EXAM: IR NEPHROSTOMY PLACEMENT RIGHT; ULTRAOUND INTRAOPERATIVE - NRPT [facility] COMPARISON:  None. MEDICATIONS: None ANESTHESIA/SEDATION: Fentanyl 50 mcg IV; Versed 1 mg IV Moderate Sedation Time:  20 minutes The patient was continuously monitored during the procedure by the interventional radiology nurse under my direct supervision. CONTRAST:  10 cc-administered into the collecting system(s) FLUOROSCOPY TIME:  Fluoroscopy Time: 3 minutes 0 seconds COMPLICATIONS: None PROCEDURE: Informed written consent was obtained from the  patient's family after a thorough discussion of the procedural risks, benefits and alternatives. All questions were addressed. Maximal Sterile Barrier Technique was utilized including caps, mask, sterile gowns, sterile gloves, sterile drape, hand hygiene and skin antiseptic. A timeout was performed prior to the initiation of the procedure. Patient positioned prone position on the fluoroscopy table. Ultrasound survey of the right flank was performed with images stored and sent to PACs. The patient was then prepped and draped in the usual sterile fashion. 1% lidocaine was used to anesthetize the skin and subcutaneous tissues for local anesthesia. A Chiba needle was then used to access a posterior inferior calyx with ultrasound guidance. With spontaneous urine returned through the needle, passage of an 018 micro wire into the collecting system was performed under fluoroscopy. A small incision was made with an 11 blade scalpel, and the needle was removed from the wire. An Accustick system was then advanced over the wire into the collecting system under fluoroscopy. The metal stiffener and inner dilator were removed, and then a sample of fluid was aspirated through the 4 French outer sheath. Bentson wire was passed into the collecting system and the sheath removed. Ten French dilation of the soft tissues was performed. Using modified Seldinger technique, a 10 French pigtail catheter drain was placed over the Bentson wire. Wire and inner stiffener removed, and the pigtail was formed in the collecting system. Small amount of contrast confirmed position of the catheter. Patient tolerated the procedure well and remained hemodynamically stable throughout. No complications were encountered and no significant blood loss encountered IMPRESSION: Status post right percutaneous nephrostomy. Signed, Dulcy Fanny. Dellia Nims, RPVI [facility] Radiology Electronically Signed   By: Corrie Mckusick D.O.   On: 01/01/2019 07:42   ASSESSMENT AND PLAN:  85 year old  male with past medical history of abdominal aortic aneurysm, hypertension, hyperlipidemia, history of prostate cancer, diabetes who presented to the hospital due to bilateral hydroureteronephrosis, increasing shortness of breath and noted to be in acute renal failure.  1.  Pseudomonas bacteremia-secondary to UTI. -Continue IV cefepimetill July 10th -pt was seen by infectious disease and repeat blood cultures negatvie so far -s/pPICC line placementJune 20th  2. Acute on Chronic renal failure III/IV-secondary to obstruction from bilateral hydroureteronephrosis with h/o Bladder cancer -Patient is status post percutaneous bilateral nephrostomy tube and foley -Renal function at baseline is around 2.3 and currently elevated over 5. -Nephrology following.  -Continue sodium bicarbonate, no acute indication for hemodialysis presently. -creat 4.98 at discharge -spoke with IR dr Reesa Chew will will try to inject right-sided tube that is draining blood and consider change to be if needed. -Repeat CT abdomen yesterday did not show any new findings other than hydronephrosis improved -Foley catheter removed after discussing with Dr. Bernardo Heater  3.Acute on chronic diastolic CHF-this is the source of patient's worsening shortness of breath. -Improving with IV diuresis--d/c lasix--appears dry -about 10L (-) since admission and responding to diuresis - Continue Norvasc, metoprolol. -d/ced IV lasix  4. Essential Hypertension - cont. Norvasc, Metoprolol.   5. DM Type II - cont. SSI and follow BS.  -restart Januvia 25 mg qd  6. GERD - cont. Protonix.   7. Hyperlipidemia - cont. Crestor.   8. BPH - cont. Flomax, Finasteride.  - s/p IR guided Perc. Bilateral Nephrostomy tubes   He walks with a walker at baseline. Building services engineer.  Discussed with daughter Dr. Primus Bravo on the phone  CODE STATUS: Full code   Case  discussed with Care Management/Social Worker. Management plans discussed with the patient, family and they are in agreement.  CODE STATUS: full  DVT Prophylaxis: SCD  TOTAL TIME TAKING CARE OF THIS PATIENT: 30** minutes.  >50% time spent on counselling and coordination of care  POSSIBLE D/C IN *1-2 DAYS, DEPENDING ON CLINICAL CONDITION.  Note: This dictation was prepared with Dragon dictation along with smaller phrase technology. Any transcriptional errors that result from this process are unintentional.  Fritzi Mandes M.D on 01/02/2019 at 8:47 AM  Between 7am to 6pm - Pager - [phone]  After 6pm go to [email or web address] - password [email or web address] [place] [facility]  Office  [phone]  CC: Primary care physician; Adin Hector, MDPatient ID: Blake [name], male   DOB: [birth year], 85 y.o.   MRN: [id number]

## 2019-01-02 NOTE — Consult Note (Signed)
Pharmacy Antibiotic Note  Blake [name] is a 85 y.o. male admitted on 01/02/2019 with sepsis and unknown source .  Pharmacy has been consulted for Vancomycin dosing. Patient was recently admitted for Pseudomonas bacteremia and was to be discharged home on Cefepime 1 g q24H. Most recent Bcx on 6/19 NGTD.   Vancomycin 1000 mg x1 dose given in ED.   Plan: Will order an additional 1000 mg for a total of 2000 mg loading dose. Will monitor Scr and order 24-hr VR.      Temp (24hrs), Avg:97.9 F (36.6 C), Min:97.5 F (36.4 C), Max:98.4 F (36.9 C)  Recent Labs  Lab 12/27/18 0520 12/28/18 0337 12/29/18 0112 12/30/18 0655 12/31/18 1009 01/02/19 1523  WBC 7.0 6.7 8.4 10.0  --   --   CREATININE 5.39* 5.28* 5.19* 5.07* 4.98* 4.85*    Estimated Creatinine Clearance: 10.7 mL/min (A) (by C-G formula based on SCr of 4.85 mg/dL (H)).    Allergies  Allergen Reactions  . Diovan [Valsartan] Cough  . Gabapentin Other (See Comments)    Sedation at all doses  . Hydralazine Itching  . Lisinopril Cough  . Pregabalin Other (See Comments)    Sedation at all doses  . Levofloxacin Other (See Comments)    Too many side effects. Nausea, vomiting, upset stomach, increased confusion, etc Other reaction(s): Confusion Nausea, chills Nausea, chills   . Sulfamethoxazole-Trimethoprim Other (See Comments)    Too many side effects. Nausea, vomiting, upset stomach, increased confusion, etc    Antimicrobials this admission: 6/23 Vancomycin >>   Dose adjustments this admission: N/A  Microbiology results: 6/23 BCx: pending    Thank you for allowing pharmacy to be a part of this patient's care.  Rowland Lathe 01/02/2019 8:32 PM

## 2019-01-02 NOTE — ED Notes (Signed)
Resumed care from paige rn.

## 2019-01-02 NOTE — Discharge Summary (Signed)
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 AAA (abdominal aortic aneurysm) ([place])   . Arrhythmia   . Diabetes mellitus without complication ([place])   . GI bleed   . Heart murmur   . Hyperlipidemia   . Hypertension   . Pacemaker   . Prostate cancer [facility])     HOSPITAL COURSE:   85 year old male with past medical history of abdominal aortic aneurysm, hypertension, hyperlipidemia, history of prostate cancer, diabetes who presented to the hospital due to bilateral hydroureteronephrosis, increasing shortness of breath and noted to be in acute renal failure.  1.   Pseudomonas bacteremia- secondary to UTI. - Continue IV cefepime till July 10th -pt was seen by infectious disease and repeat blood cultures negatvie so far   -s/p PICC line placement June 20th  2. Acute on Chronic renal failure III- secondary to obstruction from bilateral hydroureteronephrosis with h/o Bladder cancer -Patient is status post percutaneous bilateral nephrostomy tube and foley -Renal function at baseline is around 2.3 and currently elevated over 5.  -Nephrology following.  Continue sodium bicarbonate, no acute indication for hemodialysis presently. -creat 4.98 at discharge -patient had some issues with bleeding in  the right nephrectomy tube. She underwent fluoroscopy guided nephrostogram-- did not show evidence of extravasation of the eye or any source of bleeding.  -Discussed all the results  with patient's daughter Dr. Primus Bravo. She is okay to take patient home.  3. Acute on chronic diastolic CHF-this is the source of patient's worsening shortness of breath. - Improving with IV diuresis--d/c lasix--appears dry - about 10 L (-) since admission and responding to diuresis -  Continue Norvasc, metoprolol. -d/ced IV lasix  4. Essential Hypertension - cont. Norvasc, Metoprolol.   5. DM Type II - cont. SSI and follow BS.  -restart Januvia 25 mg qd  6. GERD - cont. Protonix.   7. Hyperlipidemia - cont. Crestor.   8. BPH - cont. Flomax, Finasteride.   Patient will discharged home today.  He walks with a walker at baseline. Building services engineer.  Home PT and RN has been arranged.CODE STATUS: Full code   CONSULTS OBTAINED:  Treatment Team:  Lucas Mallow, MD  DRUG ALLERGIES:   Allergies  Allergen Reactions  . Diovan [Valsartan] Cough  . Gabapentin Other (See Comments)    Sedation at all doses  . Hydralazine Itching  . Lisinopril Cough  . Pregabalin Other (See Comments)    Sedation at all doses  . Levofloxacin Other (See Comments)    Too many side effects. Nausea, vomiting, upset stomach, increased confusion, etc Other reaction(s): Confusion Nausea, chills Nausea, chills   . Sulfamethoxazole-Trimethoprim Other (See Comments)    Too many side effects. Nausea, vomiting, upset stomach, increased confusion, etc    DISCHARGE MEDICATIONS:   Allergies as of 01/02/2019      Reactions   Diovan [  valsartan] Cough   Gabapentin Other (See Comments)   Sedation at all doses   Hydralazine Itching   Lisinopril Cough   Pregabalin Other (See Comments)   Sedation at all doses   Levofloxacin Other (See Comments)   Too many side effects. Nausea, vomiting, upset stomach, increased confusion,  etc Other reaction(s): Confusion Nausea, chills Nausea, chills   Sulfamethoxazole-trimethoprim Other (See Comments)   Too many side effects. Nausea, vomiting, upset stomach, increased confusion, etc      Medication List    STOP taking these medications   cefTAZidime  IVPB Commonly known as: FORTAZ     TAKE these medications   amLODipine 2.5 MG tablet Commonly known as: NORVASC Take 1 tablet (2.5 mg total) by mouth daily.   ceFEPIme 1 g in sodium chloride 0.9 % 100 mL Inject 1 g into the vein daily for 16 days. Start taking on: January 03, 2019   feeding supplement (GLUCERNA SHAKE) Liqd Take 237 mLs by mouth 2 (two) times daily between meals.   finasteride 5 MG tablet Commonly known as: PROSCAR Take 5 mg by mouth daily.   Metoprolol Tartrate 37.5 MG Tabs Take 37.5 mg by mouth 2 (two) times daily. What changed: how much to take   pantoprazole 40 MG tablet Commonly known as: PROTONIX Take 40 mg by mouth daily.   PRESERVISION/LUTEIN PO Take 1 capsule by mouth 2 (two) times daily.   multivitamin-iron-minerals-folic acid chewable tablet Chew 2 tablets by mouth daily.   rosuvastatin 5 MG tablet Commonly known as: CRESTOR Take 5 mg by mouth at bedtime.   sitaGLIPtin 50 MG tablet Commonly known as: JANUVIA Take 0.5 tablets (25 mg total) by mouth daily. What changed: how much to take   sodium bicarbonate 650 MG tablet Take 1 tablet (650 mg total) by mouth 2 (two) times daily.   tamsulosin 0.4 MG Caps capsule Commonly known as: FLOMAX Take 0.4 mg by mouth daily.   VITRON-C PO Take 1 tablet by mouth daily.       If you experience worsening of your admission symptoms, develop shortness of breath, life threatening emergency, suicidal or homicidal thoughts you must seek medical attention immediately by calling 911 or calling your MD immediately  if symptoms less severe.  You Must read complete instructions/literature along with all the possible adverse  reactions/side effects for all the Medicines you take and that have been prescribed to you. Take any new Medicines after you have completely understood and accept all the possible adverse reactions/side effects.   Please note  You were cared for by a hospitalist during your hospital stay. If you have any questions about your discharge medications or the care you received while you were in the hospital after you are discharged, you can call the unit and asked to speak with the hospitalist on call if the hospitalist that took care of you is not available. Once you are discharged, your primary care physician will handle any further medical issues. Please note that NO REFILLS for any discharge medications will be authorized once you are discharged, as it is imperative that you return to your primary care physician (or establish a relationship with a primary care physician if you do not have one) for your aftercare needs so that they can reassess your need for medications and monitor your lab values. Today   SUBJECTIVE    No new complaints VITAL SIGNS:  Blood pressure 109/75, pulse 68, temperature 97.8 F (36.6 C), temperature source Oral, resp. rate 18,  height 6\' 2"  (1.88 m), weight 81.6 kg, SpO2 100 %.  I/O:    Intake/Output Summary (Last 24 hours) at 01/02/2019 1613 Last data filed at 01/02/2019 1041 Gross per 24 hour  Intake 260 ml  Output 1140 ml  Net -880 ml    PHYSICAL EXAMINATION:  GENERAL:  85 y.o.-year-old patient lying in the bed with no acute distress.  EYES: Pupils equal, round, reactive to light and accommodation. No scleral icterus. Extraocular muscles intact.  HEENT: Head atraumatic, normocephalic. Oropharynx and nasopharynx clear.  NECK:  Supple, no jugular venous distention. No thyroid enlargement, no tenderness.  LUNGS: Normal breath sounds bilaterally, no wheezing, rales,rhonchi or crepitation. No use of accessory muscles of respiration.  CARDIOVASCULAR: S1, S2 normal. No  murmurs, rubs, or gallops.  ABDOMEN: Soft, non-tender, non-distended. Bowel sounds present. No organomegaly or mass. Bilateral nephrectomy tube+ EXTREMITIES: No pedal edema, cyanosis, or clubbing.  NEUROLOGIC: Cranial nerves II through XII are intact. Muscle strength 5/5 in all extremities. Sensation intact. Gait not checked.  PSYCHIATRIC: The patient is alert and oriented x 2.  SKIN: No obvious rash, lesion, or ulcer.   DATA REVIEW:   CBC  Recent Labs  Lab 12/30/18 0655 01/01/19 1449  WBC 10.0  --   HGB 9.0* 8.3*  HCT 29.6*  --   PLT 125*  --     Chemistries  Recent Labs  Lab 01/02/19 1523  NA 135  K 3.3*  CL 99  CO2 25  GLUCOSE 157*  BUN 77*  CREATININE 4.85*  CALCIUM 8.3*    Microbiology Results   Recent Results (from the past 240 hour(s))  Culture, blood (routine x 2)     Status: None   Collection Time: 12/25/18  6:15 AM   Specimen: BLOOD  Result Value Ref Range Status   Specimen Description BLOOD LEFT ANTECUBITAL  Final   Special Requests   Final    BOTTLES DRAWN AEROBIC AND ANAEROBIC Blood Culture adequate volume   Culture   Final    NO GROWTH 5 DAYS Performed at [facility], [address], [place], [place] [address]    Report Status 12/30/2018 FINAL  Final  Culture, blood (routine x 2)     Status: Abnormal   Collection Time: 12/25/18  6:15 AM   Specimen: BLOOD  Result Value Ref Range Status   Specimen Description   Final    BLOOD RIGHT FOREARM Performed at [facility], [address], [place], [place] [address]    Special Requests   Final    BOTTLES DRAWN AEROBIC AND ANAEROBIC Blood Culture results may not be optimal due to an excessive volume of blood received in culture bottles Performed at [facility], [address], [place], [place] [address]    Culture  Setup Time   Final    GRAM NEGATIVE RODS AEROBIC BOTTLE ONLY CRITICAL RESULT CALLED TO, READ BACK BY AND VERIFIED WITH:  DAVID BESANTI PHARMD 0300 12/26/2018  HNM Performed at [facility], [place] [address] [place], [place] [address]    Culture PSEUDOMONAS AERUGINOSA (A)  Final   Report Status 12/28/2018 FINAL  Final   Organism ID, Bacteria PSEUDOMONAS AERUGINOSA  Final      Susceptibility   Pseudomonas aeruginosa - MIC*    CEFTAZIDIME 4 SENSITIVE Sensitive     CIPROFLOXACIN 2 INTERMEDIATE Intermediate     GENTAMICIN <=1 SENSITIVE Sensitive     IMIPENEM 1 SENSITIVE Sensitive     PIP/TAZO 8 SENSITIVE Sensitive  CEFEPIME 2 SENSITIVE Sensitive     * PSEUDOMONAS AERUGINOSA  Urine culture     Status: Abnormal   Collection Time: 12/25/18  6:15 AM   Specimen: Urine, Random  Result Value Ref Range Status   Specimen Description   Final    URINE, RANDOM Performed at [facility], [address] [place], [place] [address]    Special Requests   Final    NONE Performed at [facility], [place], [place] [address]    Culture >=100,000 COLONIES/mL PSEUDOMONAS AERUGINOSA (A)  Final   Report Status 12/28/2018 FINAL  Final   Organism ID, Bacteria PSEUDOMONAS AERUGINOSA (A)  Final      Susceptibility   Pseudomonas aeruginosa - MIC*    CEFTAZIDIME 4 SENSITIVE Sensitive     CIPROFLOXACIN 2 INTERMEDIATE Intermediate     GENTAMICIN <=1 SENSITIVE Sensitive     IMIPENEM 2 SENSITIVE Sensitive     PIP/TAZO 8 SENSITIVE Sensitive     CEFEPIME 2 SENSITIVE Sensitive     * >=100,000 COLONIES/mL PSEUDOMONAS AERUGINOSA  SARS Coronavirus 2 (CEPHEID- Performed in [place] hospital lab), Hosp Order     Status: None   Collection Time: 12/25/18  6:15 AM   Specimen: Nasopharyngeal  Result Value Ref Range Status   SARS Coronavirus 2 NEGATIVE NEGATIVE Final    Comment: (NOTE) If result is NEGATIVE SARS-CoV-2 target nucleic acids are NOT DETECTED. The SARS-CoV-2 RNA is generally detectable in upper and lower  respiratory specimens during the acute phase of infection. The lowest  concentration of SARS-CoV-2 viral copies  this assay can detect is 250  copies / mL. A negative result does not preclude SARS-CoV-2 infection  and should not be used as the sole basis for treatment or other  patient management decisions.  A negative result may occur with  improper specimen collection / handling, submission of specimen other  than nasopharyngeal swab, presence of viral mutation(s) within the  areas targeted by this assay, and inadequate number of viral copies  (<250 copies / mL). A negative result must be combined with clinical  observations, patient history, and epidemiological information. If result is POSITIVE SARS-CoV-2 target nucleic acids are DETECTED. The SARS-CoV-2 RNA is generally detectable in upper and lower  respiratory specimens dur ing the acute phase of infection.  Positive  results are indicative of active infection with SARS-CoV-2.  Clinical  correlation with patient history and other diagnostic information is  necessary to determine patient infection status.  Positive results do  not rule out bacterial infection or co-infection with other viruses. If result is PRESUMPTIVE POSTIVE SARS-CoV-2 nucleic acids MAY BE PRESENT.   A presumptive positive result was obtained on the submitted specimen  and confirmed on repeat testing.  While 2019 novel coronavirus  (SARS-CoV-2) nucleic acids may be present in the submitted sample  additional confirmatory testing may be necessary for epidemiological  and / or clinical management purposes  to differentiate between  SARS-CoV-2 and other Sarbecovirus currently known to infect humans.  If clinically indicated additional testing with an alternate test  methodology [phone]) is advised. The SARS-CoV-2 RNA is generally  detectable in upper and lower respiratory sp ecimens during the acute  phase of infection. The expected result is Negative. Fact Sheet for Patients:  StrictlyIdeas.no Fact Sheet for Healthcare  Providers: BankingDealers.co.za This test is not yet approved or cleared by the [place] FDA and has been authorized for detection and/or diagnosis of SARS-CoV-2 by FDA under an Emergency  Use Authorization (EUA).  This EUA will remain in effect (meaning this test can be used) for the duration of the COVID-19 declaration under Section 564(b)(1) of the Act, 21 U.S.C. section 360bbb-3(b)(1), unless the authorization is terminated or revoked sooner. Performed at [facility], [address] [place], [place] [address]   Blood Culture ID Panel (Reflexed)     Status: Abnormal   Collection Time: 12/25/18  6:15 AM  Result Value Ref Range Status   Enterococcus species NOT DETECTED NOT DETECTED Final   Listeria monocytogenes NOT DETECTED NOT DETECTED Final   Staphylococcus species NOT DETECTED NOT DETECTED Final   Staphylococcus aureus (BCID) NOT DETECTED NOT DETECTED Final   Streptococcus species NOT DETECTED NOT DETECTED Final   Streptococcus agalactiae NOT DETECTED NOT DETECTED Final   Streptococcus pneumoniae NOT DETECTED NOT DETECTED Final   Streptococcus pyogenes NOT DETECTED NOT DETECTED Final   Acinetobacter baumannii NOT DETECTED NOT DETECTED Final   Enterobacteriaceae species NOT DETECTED NOT DETECTED Final   Enterobacter cloacae complex NOT DETECTED NOT DETECTED Final   Escherichia coli NOT DETECTED NOT DETECTED Final   Klebsiella oxytoca NOT DETECTED NOT DETECTED Final   Klebsiella pneumoniae NOT DETECTED NOT DETECTED Final   Proteus species NOT DETECTED NOT DETECTED Final   Serratia marcescens NOT DETECTED NOT DETECTED Final   Carbapenem resistance NOT DETECTED NOT DETECTED Final   Haemophilus influenzae NOT DETECTED NOT DETECTED Final   Neisseria meningitidis NOT DETECTED NOT DETECTED Final   Pseudomonas aeruginosa DETECTED (A) NOT DETECTED Final    Comment: CRITICAL RESULT CALLED TO, READ BACK BY AND VERIFIED WITH: DAVID BESANTI PHARMD 0300  12/26/2018 HNM    Candida albicans NOT DETECTED NOT DETECTED Final   Candida glabrata NOT DETECTED NOT DETECTED Final   Candida krusei NOT DETECTED NOT DETECTED Final   Candida parapsilosis NOT DETECTED NOT DETECTED Final   Candida tropicalis NOT DETECTED NOT DETECTED Final    Comment: Performed at [facility], [address] [place], [place] [address]  CULTURE, BLOOD (ROUTINE X 2) w Reflex to ID Panel     Status: None (Preliminary result)   Collection Time: 12/29/18  1:12 AM   Specimen: BLOOD  Result Value Ref Range Status   Specimen Description BLOOD LEFT ANTECUBITAL  Final   Special Requests   Final    BOTTLES DRAWN AEROBIC AND ANAEROBIC Blood Culture adequate volume   Culture   Final    NO GROWTH 4 DAYS Performed at [facility], [address] [place], [place] [address]    Report Status PENDING  Incomplete  CULTURE, BLOOD (ROUTINE X 2) w Reflex to ID Panel     Status: None (Preliminary result)   Collection Time: 12/29/18  1:17 AM   Specimen: BLOOD  Result Value Ref Range Status   Specimen Description BLOOD RIGHT ANTECUBITAL  Final   Special Requests   Final    BOTTLES DRAWN AEROBIC AND ANAEROBIC Blood Culture adequate volume   Culture   Final    NO GROWTH 4 DAYS Performed at [facility], [address], [place], [place] [address]    Report Status PENDING  Incomplete    RADIOLOGY:  Ct Abdomen Pelvis Wo Contrast  Result Date: 01/01/2019 CLINICAL DATA:  Status post right nephrostomy EXAM: CT ABDOMEN AND PELVIS WITHOUT CONTRAST TECHNIQUE: Multidetector CT imaging of the abdomen and pelvis was performed following the standard protocol without IV contrast. COMPARISON:  CT abdomen pelvis, 12/25/2018 FINDINGS: Lower chest: Improved bibasilar pleural effusions,  now minimal. Associated scarring or atelectasis. Coronary artery calcifications. Hepatobiliary: No solid liver abnormality is seen. No gallstones, gallbladder wall thickening, or biliary  dilatation. Pancreas: Unremarkable. No pancreatic ductal dilatation or surrounding inflammatory changes. Spleen: Normal in size without significant abnormality. Adrenals/Urinary Tract: Adrenal glands are unremarkable. Interval placement of bilateral percutaneous nephrostomy tubes, with formed pigtails in the renal pelves and interval resolution of previously seen hydronephrosis. The urinary bladder remains decompressed by a Foley catheter. Brachytherapy pellets. Stomach/Bowel: Stomach is within normal limits. No evidence of bowel wall thickening, distention, or inflammatory changes. Generally large burden of stool in the colon with large stool ball in rectum. Pancolonic diverticulosis. 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 No enlarged abdominal or pelvic lymph nodes. Reproductive: Brachytherapy pellets. Other: No abdominal wall hernia or abnormality. No abdominopelvic ascites. 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 IMPRESSION: 1. Interval placement of bilateral percutaneous nephrostomy tubes, with formed pigtails in the renal pelves and interval resolution of previously seen hydronephrosis. The urinary bladder remains decompressed by a Foley catheter. 2.  Improved bilateral pleural effusions, now minimal. 3. Other chronic, incidental, and postoperative findings as detailed above. Electronically Signed   By: Eddie Candle M.D.   On: 01/01/2019 14:50   Dg Nephrostogram Right Thru Existing Access  Result Date: 01/02/2019 INDICATION: Bilateral nephrostomies, right nephrostomy bloody output EXAM: RIGHT NEPHROSTOGRAM THROUGH EXISTING NEPHROSTOMY COMPARISON:  12/29/2018, 01/01/2019 MEDICATIONS: None. ANESTHESIA/SEDATION: Moderate Sedation Time:  None. The patient was continuously monitored during the procedure by the interventional radiology nurse under my direct supervision. CONTRAST:  20 cc dilute Omnipaque  300-administered into the collecting system(s) FLUOROSCOPY TIME:  Fluoroscopy Time: 0 minutes 24 seconds (9 mGy). COMPLICATIONS: None immediate. PROCEDURE: Informed written consent was obtained from the patient after a thorough discussion of the procedural risks, benefits and alternatives. All questions were addressed. Maximal Sterile Barrier Technique was utilized including caps, mask, sterile gowns, sterile gloves, sterile drape, hand hygiene and skin antiseptic. A timeout was performed prior to the initiation of the procedure. Under sterile conditions, the existing right nephrostomy was injected with contrast. Fluoroscopic imaging performed. Nephrostomy retention loop in the renal pelvis in good position. Mild hydronephrosis noted. No evidence of contrast extravasation or communication to any vascular source. No obvious site of bleeding. IMPRESSION: Right nephrostomy in good position. No signs of active bleeding or contrast extravasation. Electronically Signed   By: Jerilynn Mages.  Shick M.D.   On: 01/02/2019 15:15   Ir Nephrostomy Placement Right  Result Date: 01/01/2019 INDICATION: 85 year old male with a history of bilateral ureteral occlusion EXAM: IR NEPHROSTOMY PLACEMENT RIGHT; ULTRAOUND INTRAOPERATIVE - NRPT [facility] COMPARISON:  None. MEDICATIONS: None ANESTHESIA/SEDATION: Fentanyl 50 mcg IV; Versed 1 mg IV Moderate Sedation Time:  20 minutes The patient was continuously monitored during the procedure by the interventional radiology nurse under my direct supervision. CONTRAST:  10 cc-administered into the collecting system(s) FLUOROSCOPY TIME:  Fluoroscopy Time: 3 minutes 0 seconds COMPLICATIONS: None PROCEDURE: Informed written consent was obtained from the patient's family after a thorough discussion of the procedural risks, benefits and alternatives. All questions were addressed. Maximal Sterile Barrier Technique was utilized including caps, mask, sterile gowns, sterile gloves, sterile drape, hand hygiene and skin  antiseptic. A timeout was performed prior to the initiation of the procedure. Patient positioned prone position on the fluoroscopy table. Ultrasound survey of the right flank was performed with images stored and sent to PACs. The patient was then prepped and draped in  the usual sterile fashion. 1% lidocaine was used to anesthetize the skin and subcutaneous tissues for local anesthesia. A Chiba needle was then used to access a posterior inferior calyx with ultrasound guidance. With spontaneous urine returned through the needle, passage of an 018 micro wire into the collecting system was performed under fluoroscopy. A small incision was made with an 11 blade scalpel, and the needle was removed from the wire. An Accustick system was then advanced over the wire into the collecting system under fluoroscopy. The metal stiffener and inner dilator were removed, and then a sample of fluid was aspirated through the 4 French outer sheath. Bentson wire was passed into the collecting system and the sheath removed. Ten French dilation of the soft tissues was performed. Using modified Seldinger technique, a 10 French pigtail catheter drain was placed over the Bentson wire. Wire and inner stiffener removed, and the pigtail was formed in the collecting system. Small amount of contrast confirmed position of the catheter. Patient tolerated the procedure well and remained hemodynamically stable throughout. No complications were encountered and no significant blood loss encountered IMPRESSION: Status post right percutaneous nephrostomy. Signed, Dulcy Fanny. Dellia Nims, RPVI [facility] Electronically Signed   By: Corrie Mckusick D.O.   On: 01/01/2019 07:42     CODE STATUS:     Code Status Orders  (From admission, onward)         Start     Ordered   12/25/18 1234  Full code  Continuous     12/25/18 1233        Code Status History    Date Active Date Inactive Code  Status Order ID Comments User Context   12/25/2018 0645 12/25/2018 1233 Full Code [id number]  Paulette Blanch, MD ED   11/21/2018 2217 11/29/2018 2052 Full Code [id number]  Mayer Camel, NP ED   11/05/2018 1755 11/09/2018 1728 Full Code [id number]  Mayo, Pete Pelt, MD Inpatient   09/10/2017 1430 09/13/2017 2115 Full Code [id number]  Idelle Crouch, MD Inpatient   06/10/2017 2304 06/12/2017 1908 Full Code [id number]  Lance Coon, MD Inpatient   05/17/2017 0829 05/19/2017 1525 Full Code [id number]  Hillary Bow, MD ED   03/31/2017 1334 04/03/2017 1611 Full Code [id number]  Nicholes Mango, MD Inpatient   10/21/2016 0155 10/25/2016 2145 Full Code [id number]  Hugelmeyer, [place], DO Inpatient   10/09/2016 2342 10/13/2016 2009 Full Code [id number]  Vaughan Basta, MD Inpatient   04/14/2015 1432 04/15/2015 1739 Full Code [id number]  Isaias Cowman, MD Inpatient   04/09/2015 0022 04/14/2015 1432 Full Code [id number]  Hower, Aaron Mose, MD ED   Advance Care Planning Activity    Advance Directive Documentation     Most Recent Value  Type of Advance Directive  Healthcare Power of Attorney  Pre-existing out of facility DNR order (yellow form or pink MOST form)  -  "MOST" Form in Place?  -      TOTAL TIME TAKING CARE OF THIS PATIENT: *40* minutes.    Fritzi Mandes M.D on 01/02/2019 at 4:13 PM  Between 7am to 6pm - Pager - [phone] After 6pm go to [email or web address] - password [email or web address] [facility]  Office  [phone]  CC: Primary care physician; Adin Hector, MD

## 2019-01-03 ENCOUNTER — Inpatient Hospital Stay: Payer: Medicare Other

## 2019-01-03 DIAGNOSIS — D631 Anemia in chronic kidney disease: Secondary | ICD-10-CM | POA: Diagnosis present

## 2019-01-03 DIAGNOSIS — B965 Pseudomonas (aeruginosa) (mallei) (pseudomallei) as the cause of diseases classified elsewhere: Secondary | ICD-10-CM

## 2019-01-03 DIAGNOSIS — I723 Aneurysm of iliac artery: Secondary | ICD-10-CM | POA: Diagnosis present

## 2019-01-03 DIAGNOSIS — N390 Urinary tract infection, site not specified: Secondary | ICD-10-CM

## 2019-01-03 DIAGNOSIS — I700 Atherosclerosis of aorta: Secondary | ICD-10-CM | POA: Diagnosis present

## 2019-01-03 DIAGNOSIS — D62 Acute posthemorrhagic anemia: Secondary | ICD-10-CM | POA: Diagnosis present

## 2019-01-03 DIAGNOSIS — Z1159 Encounter for screening for other viral diseases: Secondary | ICD-10-CM | POA: Diagnosis not present

## 2019-01-03 DIAGNOSIS — Z882 Allergy status to sulfonamides status: Secondary | ICD-10-CM | POA: Diagnosis not present

## 2019-01-03 DIAGNOSIS — N400 Enlarged prostate without lower urinary tract symptoms: Secondary | ICD-10-CM | POA: Diagnosis present

## 2019-01-03 DIAGNOSIS — I509 Heart failure, unspecified: Secondary | ICD-10-CM

## 2019-01-03 DIAGNOSIS — K5730 Diverticulosis of large intestine without perforation or abscess without bleeding: Secondary | ICD-10-CM | POA: Diagnosis present

## 2019-01-03 DIAGNOSIS — Z888 Allergy status to other drugs, medicaments and biological substances status: Secondary | ICD-10-CM | POA: Diagnosis not present

## 2019-01-03 DIAGNOSIS — J690 Pneumonitis due to inhalation of food and vomit: Secondary | ICD-10-CM | POA: Diagnosis present

## 2019-01-03 DIAGNOSIS — Z923 Personal history of irradiation: Secondary | ICD-10-CM

## 2019-01-03 DIAGNOSIS — N185 Chronic kidney disease, stage 5: Secondary | ICD-10-CM | POA: Diagnosis present

## 2019-01-03 DIAGNOSIS — R7881 Bacteremia: Secondary | ICD-10-CM | POA: Diagnosis present

## 2019-01-03 DIAGNOSIS — E1122 Type 2 diabetes mellitus with diabetic chronic kidney disease: Secondary | ICD-10-CM | POA: Diagnosis present

## 2019-01-03 DIAGNOSIS — Z87891 Personal history of nicotine dependence: Secondary | ICD-10-CM | POA: Diagnosis not present

## 2019-01-03 DIAGNOSIS — N136 Pyonephrosis: Secondary | ICD-10-CM | POA: Diagnosis present

## 2019-01-03 DIAGNOSIS — Z87440 Personal history of urinary (tract) infections: Secondary | ICD-10-CM

## 2019-01-03 DIAGNOSIS — Z8546 Personal history of malignant neoplasm of prostate: Secondary | ICD-10-CM

## 2019-01-03 DIAGNOSIS — R569 Unspecified convulsions: Principal | ICD-10-CM

## 2019-01-03 DIAGNOSIS — N184 Chronic kidney disease, stage 4 (severe): Secondary | ICD-10-CM

## 2019-01-03 DIAGNOSIS — Z95828 Presence of other vascular implants and grafts: Secondary | ICD-10-CM

## 2019-01-03 DIAGNOSIS — Z79899 Other long term (current) drug therapy: Secondary | ICD-10-CM

## 2019-01-03 DIAGNOSIS — I132 Hypertensive heart and chronic kidney disease with heart failure and with stage 5 chronic kidney disease, or end stage renal disease: Secondary | ICD-10-CM | POA: Diagnosis present

## 2019-01-03 DIAGNOSIS — Z950 Presence of cardiac pacemaker: Secondary | ICD-10-CM

## 2019-01-03 DIAGNOSIS — I714 Abdominal aortic aneurysm, without rupture: Secondary | ICD-10-CM | POA: Diagnosis present

## 2019-01-03 DIAGNOSIS — N179 Acute kidney failure, unspecified: Secondary | ICD-10-CM | POA: Diagnosis present

## 2019-01-03 DIAGNOSIS — Z936 Other artificial openings of urinary tract status: Secondary | ICD-10-CM

## 2019-01-03 DIAGNOSIS — C679 Malignant neoplasm of bladder, unspecified: Secondary | ICD-10-CM

## 2019-01-03 DIAGNOSIS — N1330 Unspecified hydronephrosis: Secondary | ICD-10-CM

## 2019-01-03 DIAGNOSIS — Z8619 Personal history of other infectious and parasitic diseases: Secondary | ICD-10-CM

## 2019-01-03 DIAGNOSIS — E876 Hypokalemia: Secondary | ICD-10-CM | POA: Diagnosis present

## 2019-01-03 DIAGNOSIS — Z881 Allergy status to other antibiotic agents status: Secondary | ICD-10-CM | POA: Diagnosis not present

## 2019-01-03 DIAGNOSIS — E785 Hyperlipidemia, unspecified: Secondary | ICD-10-CM | POA: Diagnosis present

## 2019-01-03 LAB — BASIC METABOLIC PANEL
Anion gap: 14 (ref 5–15)
BUN: 71 mg/dL — ABNORMAL HIGH (ref 8–23)
CO2: 22 mmol/L (ref 22–32)
Calcium: 8.4 mg/dL — ABNORMAL LOW (ref 8.9–10.3)
Chloride: 103 mmol/L (ref 98–111)
Creatinine, Ser: 4.85 mg/dL — ABNORMAL HIGH (ref 0.61–1.24)
GFR calc Af Amer: 11 mL/min — ABNORMAL LOW (ref >60)
GFR calc non Af Amer: 10 mL/min — ABNORMAL LOW (ref >60)
Glucose, Bld: 203 mg/dL — ABNORMAL HIGH (ref 70–99)
Potassium: 4.3 mmol/L (ref 3.5–5.1)
Sodium: 139 mmol/L (ref 135–145)

## 2019-01-03 LAB — GLUCOSE, CAPILLARY
Glucose-Capillary: 166 mg/dL — ABNORMAL HIGH (ref 70–99)
Glucose-Capillary: 140 mg/dL — ABNORMAL HIGH (ref 70–99)
Glucose-Capillary: 147 mg/dL — ABNORMAL HIGH (ref 70–99)
Glucose-Capillary: 140 mg/dL — ABNORMAL HIGH (ref 70–99)

## 2019-01-03 LAB — CULTURE, BLOOD (ROUTINE X 2)
Culture: NO GROWTH
Culture: NO GROWTH
Special Requests: ADEQUATE
Special Requests: ADEQUATE

## 2019-01-03 LAB — CBC
HCT: 25.9 % — ABNORMAL LOW (ref 39.0–52.0)
Hemoglobin: 7.9 g/dL — ABNORMAL LOW (ref 13.0–17.0)
MCH: 26.7 pg (ref 26.0–34.0)
MCHC: 30.5 g/dL (ref 30.0–36.0)
MCV: 87.5 fL (ref 80.0–100.0)
Platelets: 136 10*3/uL — ABNORMAL LOW (ref 150–400)
RBC: 2.96 MIL/uL — ABNORMAL LOW (ref 4.22–5.81)
RDW: 15.9 % — ABNORMAL HIGH (ref 11.5–15.5)
WBC: 20.4 10*3/uL — ABNORMAL HIGH (ref 4.0–10.5)
nRBC: 0 % (ref 0.0–0.2)

## 2019-01-03 LAB — HEMOGLOBIN A1C
Hgb A1c MFr Bld: 6.9 % — ABNORMAL HIGH (ref 4.8–5.6)
Mean Plasma Glucose: 151.33 mg/dL

## 2019-01-03 LAB — TROPONIN I (HIGH SENSITIVITY): Troponin I (High Sensitivity): 26 ng/L — ABNORMAL HIGH (ref <18)

## 2019-01-03 LAB — MAGNESIUM: Magnesium: 1.7 mg/dL (ref 1.7–2.4)

## 2019-01-03 LAB — URINE CULTURE: Culture: NO GROWTH

## 2019-01-03 LAB — MRSA PCR SCREENING: MRSA by PCR: NEGATIVE

## 2019-01-03 MED ORDER — TAMSULOSIN HCL 0.4 MG PO CAPS
0.4000 mg | ORAL_CAPSULE | Freq: Every day | ORAL | Status: DC
  Administered 2019-01-03 – 2019-01-09 (×7): 0.4 mg via ORAL
  Filled 2019-01-03 (×7): qty 1

## 2019-01-03 MED ORDER — ACETAMINOPHEN 325 MG PO TABS
650.0000 mg | ORAL_TABLET | Freq: Four times a day (QID) | ORAL | Status: DC | PRN
  Administered 2019-01-04: 650 mg via ORAL
  Filled 2019-01-03: qty 2

## 2019-01-03 MED ORDER — ENOXAPARIN SODIUM 40 MG/0.4ML ~~LOC~~ SOLN: 40.0000 mg | SUBCUTANEOUS | Status: DC

## 2019-01-03 MED ORDER — METOPROLOL TARTRATE 25 MG PO TABS
37.5000 mg | ORAL_TABLET | Freq: Two times a day (BID) | ORAL | Status: DC
  Administered 2019-01-03 – 2019-01-05 (×4): 37.5 mg via ORAL
  Filled 2019-01-03 (×4): qty 2

## 2019-01-03 MED ORDER — INSULIN ASPART 100 UNIT/ML ~~LOC~~ SOLN
0.0000 [IU] | Freq: Three times a day (TID) | SUBCUTANEOUS | Status: DC
  Administered 2019-01-03: 09:00:00 2 [IU] via SUBCUTANEOUS
  Administered 2019-01-03 – 2019-01-04 (×3): 1 [IU] via SUBCUTANEOUS
  Administered 2019-01-04: 17:00:00 2 [IU] via SUBCUTANEOUS
  Administered 2019-01-04: 09:00:00 1 [IU] via SUBCUTANEOUS
  Administered 2019-01-05 (×3): 2 [IU] via SUBCUTANEOUS
  Administered 2019-01-06: 12:00:00 3 [IU] via SUBCUTANEOUS
  Administered 2019-01-06: 2 [IU] via SUBCUTANEOUS
  Administered 2019-01-06: 09:00:00 1 [IU] via SUBCUTANEOUS
  Administered 2019-01-07: 2 [IU] via SUBCUTANEOUS
  Administered 2019-01-07: 13:00:00 1 [IU] via SUBCUTANEOUS
  Administered 2019-01-07 – 2019-01-08 (×3): 2 [IU] via SUBCUTANEOUS
  Administered 2019-01-08: 1 [IU] via SUBCUTANEOUS
  Administered 2019-01-09: 2 [IU] via SUBCUTANEOUS
  Administered 2019-01-09 (×2): 1 [IU] via SUBCUTANEOUS
  Filled 2019-01-03 (×21): qty 1

## 2019-01-03 MED ORDER — MAGNESIUM HYDROXIDE 400 MG/5ML PO SUSP
30.0000 mL | Freq: Every day | ORAL | Status: DC | PRN
  Filled 2019-01-03: qty 30

## 2019-01-03 MED ORDER — ADULT MULTIVITAMIN W/MINERALS CH
1.0000 | ORAL_TABLET | Freq: Every day | ORAL | Status: DC
  Administered 2019-01-03 – 2019-01-05 (×3): 1 via ORAL
  Filled 2019-01-03 (×3): qty 1

## 2019-01-03 MED ORDER — TRAZODONE HCL 50 MG PO TABS: 25.0000 mg | ORAL_TABLET | Freq: Every evening | ORAL | Status: DC | PRN

## 2019-01-03 MED ORDER — ONDANSETRON HCL 4 MG PO TABS: 4.0000 mg | ORAL_TABLET | Freq: Four times a day (QID) | ORAL | Status: DC | PRN

## 2019-01-03 MED ORDER — PANTOPRAZOLE SODIUM 40 MG PO TBEC
40.0000 mg | DELAYED_RELEASE_TABLET | Freq: Every day | ORAL | Status: DC
  Administered 2019-01-03 – 2019-01-09 (×7): 40 mg via ORAL
  Filled 2019-01-03 (×7): qty 1

## 2019-01-03 MED ORDER — SODIUM CHLORIDE 0.9 % IV SOLN
INTRAVENOUS | Status: DC
  Administered 2019-01-03: 07:00:00 via INTRAVENOUS

## 2019-01-03 MED ORDER — HEPARIN SODIUM (PORCINE) 5000 UNIT/ML IJ SOLN: 5000.0000 [IU] | Freq: Three times a day (TID) | INTRAMUSCULAR | Status: DC

## 2019-01-03 MED ORDER — ASPIRIN EC 81 MG PO TBEC
81.0000 mg | DELAYED_RELEASE_TABLET | Freq: Every day | ORAL | Status: DC
  Administered 2019-01-03 – 2019-01-09 (×7): 81 mg via ORAL
  Filled 2019-01-03 (×7): qty 1

## 2019-01-03 MED ORDER — DIGOXIN 0.25 MG/ML IJ SOLN
0.2500 mg | Freq: Once | INTRAMUSCULAR | Status: AC
  Administered 2019-01-03: 0.25 mg via INTRAVENOUS
  Filled 2019-01-03: qty 2

## 2019-01-03 MED ORDER — ROSUVASTATIN CALCIUM 10 MG PO TABS
5.0000 mg | ORAL_TABLET | Freq: Every evening | ORAL | Status: DC
  Administered 2019-01-03 – 2019-01-09 (×7): 5 mg via ORAL
  Filled 2019-01-03 (×7): qty 1

## 2019-01-03 MED ORDER — SODIUM CHLORIDE 0.9 % IV SOLN
500.0000 mg | Freq: Two times a day (BID) | INTRAVENOUS | Status: DC
  Administered 2019-01-03 – 2019-01-05 (×5): 500 mg via INTRAVENOUS
  Filled 2019-01-03 (×3): qty 500
  Filled 2019-01-03: qty 0.5
  Filled 2019-01-03 (×2): qty 500

## 2019-01-03 MED ORDER — SODIUM CHLORIDE 0.9 % IV SOLN
Freq: Once | INTRAVENOUS | Status: AC
  Administered 2019-01-03: 13:00:00 via INTRAVENOUS

## 2019-01-03 MED ORDER — SODIUM CHLORIDE 0.9 % IV SOLN: Freq: Once | INTRAVENOUS | Status: DC

## 2019-01-03 MED ORDER — AMLODIPINE BESYLATE 5 MG PO TABS
2.5000 mg | ORAL_TABLET | Freq: Every day | ORAL | Status: DC
  Administered 2019-01-03 – 2019-01-05 (×3): 2.5 mg via ORAL
  Filled 2019-01-03 (×3): qty 1

## 2019-01-03 MED ORDER — POTASSIUM CHLORIDE 20 MEQ PO PACK
40.0000 meq | PACK | Freq: Once | ORAL | Status: AC
  Administered 2019-01-03: 40 meq via ORAL
  Filled 2019-01-03: qty 2

## 2019-01-03 MED ORDER — FINASTERIDE 5 MG PO TABS
5.0000 mg | ORAL_TABLET | Freq: Every day | ORAL | Status: DC
  Administered 2019-01-03 – 2019-01-09 (×7): 5 mg via ORAL
  Filled 2019-01-03 (×8): qty 1

## 2019-01-03 MED ORDER — ACETAMINOPHEN 650 MG RE SUPP: 650.0000 mg | Freq: Four times a day (QID) | RECTAL | Status: DC | PRN

## 2019-01-03 MED ORDER — LINAGLIPTIN 5 MG PO TABS
5.0000 mg | ORAL_TABLET | Freq: Every day | ORAL | Status: DC
  Administered 2019-01-03 – 2019-01-09 (×7): 5 mg via ORAL
  Filled 2019-01-03 (×8): qty 1

## 2019-01-03 MED ORDER — METRONIDAZOLE IN NACL 5-0.79 MG/ML-% IV SOLN
500.0000 mg | Freq: Three times a day (TID) | INTRAVENOUS | Status: DC
  Administered 2019-01-03: 500 mg via INTRAVENOUS
  Filled 2019-01-03 (×3): qty 100

## 2019-01-03 MED ORDER — FERROUS SULFATE 325 (65 FE) MG PO TABS
325.0000 mg | ORAL_TABLET | Freq: Every day | ORAL | Status: DC
  Administered 2019-01-03 – 2019-01-09 (×7): 325 mg via ORAL
  Filled 2019-01-03 (×8): qty 1

## 2019-01-03 MED ORDER — LORAZEPAM 2 MG/ML IJ SOLN: 1.0000 mg | INTRAMUSCULAR | Status: DC | PRN

## 2019-01-03 MED ORDER — SODIUM BICARBONATE 650 MG PO TABS
650.0000 mg | ORAL_TABLET | Freq: Two times a day (BID) | ORAL | Status: DC
  Administered 2019-01-03 – 2019-01-09 (×12): 650 mg via ORAL
  Filled 2019-01-03 (×15): qty 1

## 2019-01-03 MED ORDER — OCUVITE-LUTEIN PO CAPS
ORAL_CAPSULE | Freq: Two times a day (BID) | ORAL | Status: DC
  Administered 2019-01-03: 21:00:00 1 via ORAL
  Administered 2019-01-03: 12:00:00 via ORAL
  Administered 2019-01-04 – 2019-01-07 (×7): 1 via ORAL
  Administered 2019-01-08 – 2019-01-09 (×3): via ORAL
  Filled 2019-01-03 (×14): qty 1

## 2019-01-03 MED ORDER — GLUCERNA SHAKE PO LIQD
237.0000 mL | Freq: Two times a day (BID) | ORAL | Status: DC
  Administered 2019-01-03 – 2019-01-09 (×10): 237 mL via ORAL

## 2019-01-03 MED ORDER — SODIUM CHLORIDE 0.9 % IV SOLN
1.0000 g | INTRAVENOUS | Status: DC
  Administered 2019-01-03: 1 g via INTRAVENOUS
  Filled 2019-01-03: qty 1

## 2019-01-03 MED ORDER — ONDANSETRON HCL 4 MG/2ML IJ SOLN: 4.0000 mg | Freq: Four times a day (QID) | INTRAMUSCULAR | Status: DC | PRN

## 2019-01-03 NOTE — ED Notes (Signed)
Report off to annie rn  

## 2019-01-03 NOTE — Evaluation (Signed)
Physical Therapy Evaluation Patient Details Name: MATTIX IMHOF MRN: [id number] DOB: October 09, 1922 Today's Date: 01/03/2019   History of Present Illness  presented to ER (after recent discharge) due to seizure activity; admitted for seizure control/management and bilat LL PNA.  OF note, recent hospitalization (discharged 1-2 days prior) due to pseudomonas bacteremia, urinary obstruction s//p bilat nephrostomy tube placement.  Also of note, per recent imaging, patient noted with 5.5 cm infrarenal AAA, followed by outpatient vascular.  Clinical Impression  Patient sleeping upon arrival to session, but arousable to voice/light touch.  Oriented to basic information, but voices limited recall of events leading to rehospitalization.  Chronic/baseline ROM deficits to bilat shoulders; otherwise, bilat UE/LE strength and ROM appear grossly symmetrical and WFL for basic transfers and gait. No focal weakness appreciated.  Currently requiring min assist for bed mobility; close sup for unsupported sitting balance.  Notably fatigued with unsupported sitting, declining additional mobility/OOB attempts ("not today, it's too much").  Will continue to assess/progress as medically appropriate and patient agreeable. Of note, mild twitching of head, L UE noted at times during session (mostly as patient drifted to sleep). Non-sustained.  RN informed/aware of observation. Would benefit from skilled PT to address above deficits and promote optimal return to PLOF; Recommend transition to [place] upon discharge from acute hospitalization.     Follow Up Recommendations Home health PT;Supervision/Assistance - 24 hour    Equipment Recommendations       Recommendations for Other Services       Precautions / Restrictions Precautions Precautions: Fall;ICD/Pacemaker Precaution Comments: bilat neph tubes Restrictions Weight Bearing Restrictions: No      Mobility  Bed Mobility Overal bed mobility: Needs Assistance Bed  Mobility: Supine to Sit     Supine to sit: Min assist     General bed mobility comments: assist for task initiation and truncal elevation; heavy use of bedrails, increased time required  Transfers                 General transfer comment: patient notably fatigued with transition to unsupported sitting; declined OOB attempts, stating "not today, it's too much"  Ambulation/Gait                Stairs            Wheelchair Mobility    Modified Rankin (Stroke Patients Only)       Balance Overall balance assessment: Needs assistance Sitting-balance support: No upper extremity supported;Feet supported Sitting balance-Leahy Scale: Good                                       Pertinent Vitals/Pain Pain Assessment: Faces Faces Pain Scale: No hurt    Home Living Family/patient expects to be discharged to:: Private residence Living Arrangements: Children Available Help at Discharge: Family;Available 24 hours/day;Personal care attendant Type of Home: House Home Access: Ramped entrance     Home Layout: Multi-level;Able to live on main level with bedroom/bathroom Home Equipment: Walker - 4 wheels;Cane - single point;Bedside commode;Shower seat - built in;Wheelchair - manual      Prior Function Level of Independence: Needs assistance   Gait / Transfers Assistance Needed: limited household ambulator with Rollator  ADL's / Homemaking Assistance Needed: reports independent with dressing, toiletting, bathing. Assist required for IADLs  Comments: Out of the house for doctor's appointments only     Hand Dominance   Dominant Hand: Right  Extremity/Trunk Assessment   Upper Extremity Assessment RUE Deficits / Details: Appears to lack bilateral shoulder AROM above shoulder height but otherwise UEs are strong within available range of motion    Lower Extremity Assessment Lower Extremity Assessment: (grossly 3 to 4-/5 throughout bilat LEs)        Communication      Cognition Arousal/Alertness: Awake/alert Behavior During Therapy: WFL for tasks assessed/performed Overall Cognitive Status: Within Functional Limits for tasks assessed                                        General Comments      Exercises Other Exercises Other Exercises: Rolling bilat, min assist for full rotation; dep assist for management of incontinent BM (patient unaware)   Assessment/Plan    PT Assessment Patient needs continued PT services  PT Problem List Decreased strength;Decreased balance;Decreased mobility;Decreased activity tolerance       PT Treatment Interventions DME instruction;Gait training;Stair training;Functional mobility training;Therapeutic activities;Therapeutic exercise;Balance training;Neuromuscular re-education;Patient/family education    PT Goals (Current goals can be found in the Care Plan section)  Acute Rehab PT Goals Patient Stated Goal: agreeable to session PT Goal Formulation: With patient Time For Goal Achievement: 01/17/19 Potential to Achieve Goals: Fair    Frequency Min 2X/week   Barriers to discharge        Co-evaluation               AM-PAC PT "6 Clicks" Mobility  Outcome Measure Help needed turning from your back to your side while in a flat bed without using bedrails?: A Little Help needed moving from lying on your back to sitting on the side of a flat bed without using bedrails?: A Little Help needed moving to and from a bed to a chair (including a wheelchair)?: A Little Help needed standing up from a chair using your arms (e.g., wheelchair or bedside chair)?: A Lot Help needed to walk in hospital room?: A Lot Help needed climbing 3-5 steps with a railing? : A Lot 6 Click Score: 15    End of Session   Activity Tolerance: Patient limited by fatigue Patient left: in chair;with call bell/phone within reach;with chair alarm set Nurse Communication: Mobility status PT Visit  Diagnosis: Unsteadiness on feet (R26.81);Muscle weakness (generalized) (M62.81);Difficulty in walking, not elsewhere classified (R26.2)    Time: 1540-1605 PT Time Calculation (min) (ACUTE ONLY): 25 min   Charges:   PT Evaluation $PT Eval Moderate Complexity: 1 Mod PT Treatments $Therapeutic Activity: 8-22 mins      Tori Dattilio H. Owens Shark, PT, DPT, NCS 01/03/19, 7:56 PM [phone]

## 2019-01-03 NOTE — ED Notes (Signed)
Pt resting with eyes closed.   afib on monitor.  Iv in place.

## 2019-01-03 NOTE — Progress Notes (Addendum)
[facility] NAME: Blake [name]    MR#:  [id number]  DATE OF BIRTH:  [birth year]  SUBJECTIVE:   Patient admitted with possible seizure like activity when he went home. Waking up. Answers couple questions. Appears confused at present. No seizure like activity noted here. REVIEW OF SYSTEMS:   Review of Systems  Unable to perform ROS: Medical condition    DRUG ALLERGIES:   Allergies  Allergen Reactions  . Diovan [Valsartan] Cough  . Gabapentin Other (See Comments)    Sedation at all doses  . Hydralazine Itching  . Lisinopril Cough  . Pregabalin Other (See Comments)    Sedation at all doses  . Levofloxacin Other (See Comments)    Too many side effects. Nausea, vomiting, upset stomach, increased confusion, etc Other reaction(s): Confusion Nausea, chills Nausea, chills   . Sulfamethoxazole-Trimethoprim Other (See Comments)    Too many side effects. Nausea, vomiting, upset stomach, increased confusion, etc    VITALS:  Blood pressure 130/69, pulse 90, temperature 98.7 F (37.1 C), temperature source Oral, resp. rate 18, SpO2 98 %.  PHYSICAL EXAMINATION:   Physical Exam  GENERAL:  85 y.o.-year-old patient lying in the bed with no acute distress. Chronically ill EYES: Pupils equal, round, reactive to light and accommodation. No scleral icterus. Extraocular muscles intact.  HEENT: Head atraumatic, normocephalic. Oropharynx and nasopharynx clear.  NECK:  Supple, no jugular venous distention. No thyroid enlargement, no tenderness.  LUNGS: Normal breath sounds bilaterally, no wheezing, rales, rhonchi. No use of accessory muscles of respiration.  CARDIOVASCULAR: S1, S2 normal. No murmurs, rubs, or gallops.  ABDOMEN: Soft, nontender, nondistended. Bowel sounds present. No organomegaly or mass. Bilateral nephrostomy tubes present EXTREMITIES: No cyanosis, clubbing or edema b/l.    NEUROLOGIC: Cranial nerves II through XII  are intact. No focal Motor or sensory deficits b/l. Moves all extremities well PSYCHIATRIC:  patient is alert but appears a bit confused SKIN: No obvious rash, lesion, or ulcer.   LABORATORY PANEL:  CBC Recent Labs  Lab 01/03/19 0444  WBC 20.4*  HGB 7.9*  HCT 25.9*  PLT 136*    Chemistries  Recent Labs  Lab 01/02/19 2014 01/03/19 0444  NA 138 139  K 3.2* 4.3  CL 100 103  CO2 22 22  GLUCOSE 190* 203*  BUN 76* 71*  CREATININE 4.95* 4.85*  CALCIUM 8.6* 8.4*  MG  --  1.7  AST 25  --   ALT 20  --   ALKPHOS 63  --   BILITOT 0.3  --    Cardiac Enzymes No results for input(s): TROPONINI in the last 168 hours. RADIOLOGY:  Ct Abdomen Pelvis Wo Contrast  Result Date: 01/03/2019 CLINICAL DATA:  85 year old male with persistent cough. Recent nephrostomy placement. EXAM: CT CHEST, ABDOMEN AND PELVIS WITHOUT CONTRAST TECHNIQUE: Multidetector CT imaging of the chest, abdomen and pelvis was performed following the standard protocol without IV contrast. COMPARISON:  CT of the abdomen pelvis dated 01/01/2019 FINDINGS: Evaluation of this exam is limited in the absence of intravenous contrast. Evaluation is also limited due to streak artifact caused by patient's arms. CT CHEST FINDINGS Cardiovascular: There is no cardiomegaly or pericardial effusion. Multi vessel coronary vascular calcification. Left pectoral pacemaker device. Advanced atherosclerotic calcification of the thoracic aorta. No aneurysmal dilatation. The central pulmonary arteries are grossly unremarkable. Right-sided PICC with tip in the central SVC. Mediastinum/Nodes: No hilar or mediastinal adenopathy. There is a small hiatal hernia. The esophagus is  slightly patulous and contains debris. No mediastinal fluid collection. Lungs/Pleura: Small right pleural effusion. Bilateral lower lobe predominant airspace densities, new since the prior CT most consistent with pneumonia. Aspiration is not excluded. Clinical correlation is  recommended. There is a 9 mm right upper lobe nodule. No pneumothorax. Small amount of debris or mucous material noted in the left mainstem bronchus. Musculoskeletal: Osteopenia with degenerative changes of the spine. Bilateral carotid bulb calcified plaques. No acute osseous pathology. Multilevel anterior bridging osteophyte and syndesmophyte may represent DISH or related to ankylosing spondylitis. No acute osseous pathology. CT ABDOMEN PELVIS FINDINGS No intra-abdominal free air or free fluid. Hepatobiliary: No focal liver abnormality is seen. No gallstones, gallbladder wall thickening, or biliary dilatation. Pancreas: Unremarkable. No pancreatic ductal dilatation or surrounding inflammatory changes. Spleen: Normal in size without focal abnormality. Adrenals/Urinary Tract: The adrenal glands are unremarkable. Similar position of bilateral nephrostomy tubes. There is no hydronephrosis on either side. There is high attenuating content in the right renal collecting system and pelvis extending down in the right ureter suspicious for blood product. Correlation with urinalysis recommended. There is no hydronephrosis on either side. The urinary bladder is collapsed. Stomach/Bowel: There is extensive sigmoid diverticulosis and scattered colonic diverticula without active inflammatory changes. No bowel obstruction or active inflammation. No evidence of acute appendicitis. Vascular/Lymphatic: Advanced aortoiliac atherosclerotic disease. Infrarenal abdominal aortic aneurysm measures up to 5.5 cm in greatest diameter. This is essentially unchanged compared to the prior CT by my measurements. Bilateral common iliac artery aneurysms measure 2.6 cm on the right and 2.4 cm on the left. Evaluation of the vasculature is limited in the absence of intravenous contrast. The IVC is unremarkable. No portal venous gas. There is no adenopathy. Reproductive: Prostate brachytherapy seeds. Other: None Musculoskeletal: Osteopenia with  degenerative changes of the spine. No acute osseous pathology. IMPRESSION: 1. Interval development of bilateral lower lobe predominant airspace densities most consistent with pneumonia and possible aspiration. Clinical correlation is recommended. Small right pleural effusion. 2. High attenuating content in the right renal collecting system and right ureter suspicious for blood product. Correlation with urinalysis recommended. No hydronephrosis on either side. 3. Stable position of bilateral nephrostomy tubes. 4. Extensive sigmoid and colonic diverticulosis without active inflammatory changes. No bowel obstruction. 5. Advanced aortoiliac atherosclerotic disease with a 5.5 cm infrarenal abdominal aortic aneurysm and bilateral common iliac artery aneurysms. Recommend followup by abdomen and pelvis CTA in 3-6 months, and vascular surgery referral/consultation if not already obtained. This recommendation follows ACR consensus guidelines: White Paper of the ACR Incidental Findings Committee II on Vascular Findings. J Am Coll Radiol 2013; 10:789-794. Electronically Signed   By: Anner Crete M.D.   On: 01/03/2019 00:27   Ct Abdomen Pelvis Wo Contrast  Result Date: 01/01/2019 CLINICAL DATA:  Status post right nephrostomy EXAM: CT ABDOMEN AND PELVIS WITHOUT CONTRAST TECHNIQUE: Multidetector CT imaging of the abdomen and pelvis was performed following the standard protocol without IV contrast. COMPARISON:  CT abdomen pelvis, 12/25/2018 FINDINGS: Lower chest: Improved bibasilar pleural effusions, now minimal. Associated scarring or atelectasis. Coronary artery calcifications. Hepatobiliary: No solid liver abnormality is seen. No gallstones, gallbladder wall thickening, or biliary dilatation. Pancreas: Unremarkable. No pancreatic ductal dilatation or surrounding inflammatory changes. Spleen: Normal in size without significant abnormality. Adrenals/Urinary Tract: Adrenal glands are unremarkable. Interval placement of  bilateral percutaneous nephrostomy tubes, with formed pigtails in the renal pelves and interval resolution of previously seen hydronephrosis. The urinary bladder remains decompressed by a Foley catheter. Brachytherapy pellets. Stomach/Bowel: Stomach is within  normal limits. No evidence of bowel wall thickening, distention, or inflammatory changes. Generally large burden of stool in the colon with large stool ball in rectum. Pancolonic diverticulosis. 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 No enlarged abdominal or pelvic lymph nodes. Reproductive: Brachytherapy pellets. Other: No abdominal wall hernia or abnormality. No abdominopelvic ascites. 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 IMPRESSION: 1. Interval placement of bilateral percutaneous nephrostomy tubes, with formed pigtails in the renal pelves and interval resolution of previously seen hydronephrosis. The urinary bladder remains decompressed by a Foley catheter. 2.  Improved bilateral pleural effusions, now minimal. 3. Other chronic, incidental, and postoperative findings as detailed above. Electronically Signed   By: Eddie Candle M.D.   On: 01/01/2019 14:50   Ct Head Wo Contrast  Result Date: 01/03/2019 CLINICAL DATA:  Seizure. EXAM: CT HEAD WITHOUT CONTRAST TECHNIQUE: Contiguous axial images were obtained from the base of the skull through the vertex without intravenous contrast. COMPARISON:  November 05, 2018. FINDINGS: Brain: No evidence of acute infarction, hemorrhage, hydrocephalus, extra-axial collection or mass lesion/mass effect. Volume loss and chronic microvascular ischemic changes are noted. Vascular: No hyperdense vessel or unexpected calcification. Skull: Normal. Negative for fracture or focal lesion. Sinuses/Orbits: No acute finding. Other: None. IMPRESSION: 1. No acute intracranial abnormality. 2. Again identified are chronic  microvascular ischemic changes and extensive volume loss, similar to prior study dated November 05, 2018. Electronically Signed   By: Constance Holster M.D.   On: 01/03/2019 00:32   Ct Chest Wo Contrast  Result Date: 01/03/2019 CLINICAL DATA:  85 year old male with persistent cough. Recent nephrostomy placement. EXAM: CT CHEST, ABDOMEN AND PELVIS WITHOUT CONTRAST TECHNIQUE: Multidetector CT imaging of the chest, abdomen and pelvis was performed following the standard protocol without IV contrast. COMPARISON:  CT of the abdomen pelvis dated 01/01/2019 FINDINGS: Evaluation of this exam is limited in the absence of intravenous contrast. Evaluation is also limited due to streak artifact caused by patient's arms. CT CHEST FINDINGS Cardiovascular: There is no cardiomegaly or pericardial effusion. Multi vessel coronary vascular calcification. Left pectoral pacemaker device. Advanced atherosclerotic calcification of the thoracic aorta. No aneurysmal dilatation. The central pulmonary arteries are grossly unremarkable. Right-sided PICC with tip in the central SVC. Mediastinum/Nodes: No hilar or mediastinal adenopathy. There is a small hiatal hernia. The esophagus is slightly patulous and contains debris. No mediastinal fluid collection. Lungs/Pleura: Small right pleural effusion. Bilateral lower lobe predominant airspace densities, new since the prior CT most consistent with pneumonia. Aspiration is not excluded. Clinical correlation is recommended. There is a 9 mm right upper lobe nodule. No pneumothorax. Small amount of debris or mucous material noted in the left mainstem bronchus. Musculoskeletal: Osteopenia with degenerative changes of the spine. Bilateral carotid bulb calcified plaques. No acute osseous pathology. Multilevel anterior bridging osteophyte and syndesmophyte may represent DISH or related to ankylosing spondylitis. No acute osseous pathology. CT ABDOMEN PELVIS FINDINGS No intra-abdominal free air or free  fluid. Hepatobiliary: No focal liver abnormality is seen. No gallstones, gallbladder wall thickening, or biliary dilatation. Pancreas: Unremarkable. No pancreatic ductal dilatation or surrounding inflammatory changes. Spleen: Normal in size without focal abnormality. Adrenals/Urinary Tract: The adrenal glands are unremarkable. Similar position of bilateral nephrostomy tubes. There is no hydronephrosis on either side. There is high attenuating content in the right renal collecting system and pelvis extending down in the right ureter suspicious for blood product. Correlation with urinalysis recommended. There is no hydronephrosis on either side.  The urinary bladder is collapsed. Stomach/Bowel: There is extensive sigmoid diverticulosis and scattered colonic diverticula without active inflammatory changes. No bowel obstruction or active inflammation. No evidence of acute appendicitis. Vascular/Lymphatic: Advanced aortoiliac atherosclerotic disease. Infrarenal abdominal aortic aneurysm measures up to 5.5 cm in greatest diameter. This is essentially unchanged compared to the prior CT by my measurements. Bilateral common iliac artery aneurysms measure 2.6 cm on the right and 2.4 cm on the left. Evaluation of the vasculature is limited in the absence of intravenous contrast. The IVC is unremarkable. No portal venous gas. There is no adenopathy. Reproductive: Prostate brachytherapy seeds. Other: None Musculoskeletal: Osteopenia with degenerative changes of the spine. No acute osseous pathology. IMPRESSION: 1. Interval development of bilateral lower lobe predominant airspace densities most consistent with pneumonia and possible aspiration. Clinical correlation is recommended. Small right pleural effusion. 2. High attenuating content in the right renal collecting system and right ureter suspicious for blood product. Correlation with urinalysis recommended. No hydronephrosis on either side. 3. Stable position of bilateral  nephrostomy tubes. 4. Extensive sigmoid and colonic diverticulosis without active inflammatory changes. No bowel obstruction. 5. Advanced aortoiliac atherosclerotic disease with a 5.5 cm infrarenal abdominal aortic aneurysm and bilateral common iliac artery aneurysms. Recommend followup by abdomen and pelvis CTA in 3-6 months, and vascular surgery referral/consultation if not already obtained. This recommendation follows ACR consensus guidelines: White Paper of the ACR Incidental Findings Committee II on Vascular Findings. J Am Coll Radiol 2013; 10:789-794. Electronically Signed   By: Anner Crete M.D.   On: 01/03/2019 00:27   Dg Chest Portable 1 View  Result Date: 01/02/2019 CLINICAL DATA:  85 year old male with cough. EXAM: PORTABLE CHEST 1 VIEW COMPARISON:  Chest radiograph dated 12/25/2018 FINDINGS: Right-sided PICC with tip over the mediastinum at the level of the aortic arch. There is diffuse chronic interstitial coarsening. Bibasilar linear atelectasis/scarring noted. There has been interval clearing of the previously seen bibasilar densities. No focal consolidation, pleural effusion, or pneumothorax. Stable cardiac silhouette. Left pectoral pacemaker device. Atherosclerotic calcification of the aorta. Osteopenia with degenerative changes of the spine. No acute osseous pathology. IMPRESSION: 1. No acute cardiopulmonary process. 2. Right-sided PICC with tip over the mediastinum at the level of the aortic arch. Electronically Signed   By: Anner Crete M.D.   On: 01/02/2019 20:41   Dg Nephrostogram Right Thru Existing Access  Result Date: 01/02/2019 INDICATION: Bilateral nephrostomies, right nephrostomy bloody output EXAM: RIGHT NEPHROSTOGRAM THROUGH EXISTING NEPHROSTOMY COMPARISON:  12/29/2018, 01/01/2019 MEDICATIONS: None. ANESTHESIA/SEDATION: Moderate Sedation Time:  None. The patient was continuously monitored during the procedure by the interventional radiology nurse under my direct  supervision. CONTRAST:  20 cc dilute Omnipaque 300-administered into the collecting system(s) FLUOROSCOPY TIME:  Fluoroscopy Time: 0 minutes 24 seconds (9 mGy). COMPLICATIONS: None immediate. PROCEDURE: Informed written consent was obtained from the patient after a thorough discussion of the procedural risks, benefits and alternatives. All questions were addressed. Maximal Sterile Barrier Technique was utilized including caps, mask, sterile gowns, sterile gloves, sterile drape, hand hygiene and skin antiseptic. A timeout was performed prior to the initiation of the procedure. Under sterile conditions, the existing right nephrostomy was injected with contrast. Fluoroscopic imaging performed. Nephrostomy retention loop in the renal pelvis in good position. Mild hydronephrosis noted. No evidence of contrast extravasation or communication to any vascular source. No obvious site of bleeding. IMPRESSION: Right nephrostomy in good position. No signs of active bleeding or contrast extravasation. Electronically Signed   By: Jerilynn Mages.  Shick M.D.   On:  01/02/2019 15:15   ASSESSMENT AND PLAN:  Blake [name] is an 85 y.o. male with a known history of AAA, CHF, DM, HTN, PPM, HLD who was discharged yesterday after being admitted here for Pseudomonas bacteremia with urinary obstruction status post bilateral nephrostomy and PICC line placement for which he has been getting IV cefepime. He had a seizure episode after going home and this was new onset for him.  1.  Seizure of new onset. -    We will place him on seizure precautions.  - He will be placed on PRN IV Ativan.   - neurology consult  by Dr. Irish Elders-- Hold off antiseizure meds since this is provoked with recent sepsis and on cefepime (lowers seizure threshold)  2.  Bilateral lower lobe pneumonia, likely aspiration.     -on IV meropenem  3.  Acute kidney injury superimposed on chronic kidney disease-IV due to obstruction from bilateral hydroureteronephrosis with  history of bladder cancer, status post bilateral nephrostomy tubes.   -We will hydrate him with IV normal saline --now since on po diet will d/c IVF  4. Essential Hypertension.  -continue Norvasc and metoprolol.   5. DM Type II.  Will place on supplemental coverage with NovoLog and follow fingerstick blood glucose.  Will resume Januvia.  6. Acute on chronic anemia secondary to multiple medical reasons including nephrectomy tube bleeding. -Workup done cannot find source of bleeding. -Transfuse as needed. Hemoglobin 7.9  7. Hyperlipidemia.  Resume Crestor.   8. BPH.  Resume Flomax and finasteride  9.  DVT prophylaxis.   avoid antiplatelet secondary to anemia and losing in nephrectomy tube. Will use SCD  CODE STATUS: full  Spoke with Dr crisp and updated all of the above  TOTAL TIME TAKING CARE OF THIS PATIENT: *30 minutes.  >50% time spent on counselling and coordination of care  POSSIBLE D/C IN *couple* DAYS, DEPENDING ON CLINICAL CONDITION.  Note: This dictation was prepared with Dragon dictation along with smaller phrase technology. Any transcriptional errors that result from this process are unintentional.  Fritzi Mandes M.D on 01/03/2019 at 1:34 PM  Between 7am to 6pm - Pager - [phone]  After 6pm go to [email or web address] - password [email or web address] [facility]  Office  [phone]  CC: Primary care physician; Adin Hector, MDPatient ID: Blake [name], male   DOB: Mar 30, 1923, 85 y.o.   MRN: [id number]

## 2019-01-03 NOTE — ED Notes (Signed)
Pt continues to wait for admission bed.  Pt alert.  Iv in place.  siderails up x 2.  afib on monitor.

## 2019-01-03 NOTE — ED Notes (Signed)
.. ED TO INPATIENT HANDOFF REPORT  ED Nurse Name and Phone #: [phone]  S Name/Age/Gender Blake [name] 85 y.o. male Room/Bed: ED15A/ED15A  Code Status   Code Status: Full Code  Home/SNF/Other Home Patient oriented to: self, place, time and situation Is this baseline? Yes   Triage Complete: Triage complete  Chief Complaint Unresponsive  Triage Note PT to ED from home via EMS. PT was just d/c from hospital today after having nephrostomy tubes placed bilaterally. PT made it home, before getting out of the car had 1 seizure with 1x vomit. EMS called and they witnessed 1 seizure as well. EMS reports afib with ranges from 99-165. Per family, afib is new onset. PT also w/ hx of CHF, HTN, DM, pacemaker and kidney failure. Baseline Alert and ambulatory.    Allergies Allergies  Allergen Reactions  . Diovan [Valsartan] Cough  . Gabapentin Other (See Comments)    Sedation at all doses  . Hydralazine Itching  . Lisinopril Cough  . Pregabalin Other (See Comments)    Sedation at all doses  . Levofloxacin Other (See Comments)    Too many side effects. Nausea, vomiting, upset stomach, increased confusion, etc Other reaction(s): Confusion Nausea, chills Nausea, chills   . Sulfamethoxazole-Trimethoprim Other (See Comments)    Too many side effects. Nausea, vomiting, upset stomach, increased confusion, etc    Level of Care/Admitting Diagnosis ED Disposition    ED Disposition Condition [place] [facility] Area: [place] [[id number]]  Level of Care: Med-Surg [16]  Covid Evaluation: Screening Protocol (No Symptoms)  Diagnosis: Seizure [facility]) [[id number]]  Admitting Physician: Christel Mormon [[id number]]  Attending Physician: Christel Mormon [[id number]]  Estimated length of stay: past midnight tomorrow  Certification:: I certify this patient will need inpatient services for at least 2 midnights  PT Class (Do Not Modify): Inpatient [101]  PT Acc Code (Do Not Modify):  Private [1]       B Medical/Surgery History Past Medical History:  Diagnosis Date  . AAA (abdominal aortic aneurysm) ([place])   . Arrhythmia   . CHF (congestive heart failure) ([place])   . Diabetes mellitus without complication ([place])   . GI bleed   . Heart murmur   . Hyperlipidemia   . Hypertension   . Pacemaker   . Prostate cancer [facility])    Past Surgical History:  Procedure Laterality Date  . FLEXIBLE SIGMOIDOSCOPY N/A 10/21/2016   Procedure: FLEXIBLE SIGMOIDOSCOPY;  Surgeon: [name], MD;  Location: [facility] ENDOSCOPY;  Service: Endoscopy;  Laterality: N/A;  . IR NEPHROSTOMY PLACEMENT RIGHT  01/01/2019  . JOINT REPLACEMENT    . NEPHROSTOMY Bilateral   . PACEMAKER INSERTION Left 04/14/2015   Procedure: INSERTION PACEMAKER;  Surgeon: Isaias Cowman, MD;  Location: [facility] ORS;  Service: Cardiovascular;  Laterality: Left;  . PROSTATE SURGERY    . VISCERAL ARTERY INTERVENTION N/A 10/22/2016   Procedure: Visceral Artery Intervention;  Surgeon: Algernon Huxley, MD;  Location: [facility] CV LAB;  Service: Cardiovascular;  Laterality: N/A;     A IV Location/Drains/Wounds Patient Lines/Drains/Airways Status   Active Line/Drains/Airways    Name:   Placement date:   Placement time:   Site:   Days:   Peripheral IV 01/02/19 Left Forearm   01/02/19    2010    Forearm   1   Peripheral IV 01/02/19 Right Wrist   01/02/19    2010    Wrist   1   PICC Single Lumen 12/30/18 PICC  Right Brachial 42 cm 3 cm   12/30/18    1750    Brachial   4   Midline Single Lumen 11/28/18 Midline Left Brachial 8 cm 0 cm   11/28/18    1725    Brachial   36   Nephrostomy Left   12/26/18    -    Left   8   Nephrostomy Right 10.2 Fr.   12/29/18    1614    Right   5   Pressure Injury 11/21/18 Stage I -  Intact skin with non-blanchable redness of a localized area usually over a bony prominence. pink   11/21/18    2350     43   Pressure Injury 01/01/19 Buttocks Left Stage II -  Partial thickness loss of dermis presenting as a  shallow open ulcer with a red, pink wound bed without slough.   01/01/19    0805     2          Intake/Output Last 24 hours  Intake/Output Summary (Last 24 hours) at 01/03/2019 0300 Last data filed at 01/02/2019 2231 Gross per 24 hour  Intake 1000 ml  Output -  Net 1000 ml    Labs/Imaging Results for orders placed or performed during the hospital encounter of 01/02/19 (from the past 48 hour(s))  Lactic acid, plasma     Status: Abnormal   Collection Time: 01/02/19  8:04 PM  Result Value Ref Range   Lactic Acid, Venous 3.0 (HH) 0.5 - 1.9 mmol/L    Comment: CRITICAL RESULT CALLED TO, READ BACK BY AND VERIFIED WITH GREG MOYER AT 2128 01/02/2019.  TFK Performed at [facility], [address] [place], [place] [address]   Comprehensive metabolic panel     Status: Abnormal   Collection Time: 01/02/19  8:14 PM  Result Value Ref Range   Sodium 138 135 - 145 mmol/L   Potassium 3.2 (L) 3.5 - 5.1 mmol/L   Chloride 100 98 - 111 mmol/L   CO2 22 22 - 32 mmol/L   Glucose, Bld 190 (H) 70 - 99 mg/dL   BUN 76 (H) 8 - 23 mg/dL   Creatinine, Ser 4.95 (H) 0.61 - 1.24 mg/dL   Calcium 8.6 (L) 8.9 - 10.3 mg/dL   Total Protein 8.2 (H) 6.5 - 8.1 g/dL   Albumin 3.1 (L) 3.5 - 5.0 g/dL   AST 25 15 - 41 U/L   ALT 20 0 - 44 U/L   Alkaline Phosphatase 63 38 - 126 U/L   Total Bilirubin 0.3 0.3 - 1.2 mg/dL   GFR calc non Af Amer 9 (L) >60 mL/min   GFR calc Af Amer 11 (L) >60 mL/min   Anion gap 16 (H) 5 - 15    Comment: Performed at [facility], [address] [place], [place] [address]  CBC with Differential     Status: Abnormal   Collection Time: 01/02/19  8:14 PM  Result Value Ref Range   WBC 9.3 4.0 - 10.5 K/uL   RBC 2.93 (L) 4.22 - 5.81 MIL/uL   Hemoglobin 7.7 (L) 13.0 - 17.0 g/dL   HCT 25.4 (L) 39.0 - 52.0 %   MCV 86.7 80.0 - 100.0 fL   MCH 26.3 26.0 - 34.0 pg   MCHC 30.3 30.0 - 36.0 g/dL   RDW 15.9 (H) 11.5 - 15.5 %   Platelets 144 (L) 150 - 400 K/uL   nRBC 0.0 0.0  - 0.2 %  Neutrophils Relative % 71 %   Neutro Abs 6.7 1.7 - 7.7 K/uL   Lymphocytes Relative 17 %   Lymphs Abs 1.6 0.7 - 4.0 K/uL   Monocytes Relative 7 %   Monocytes Absolute 0.6 0.1 - 1.0 K/uL   Eosinophils Relative 2 %   Eosinophils Absolute 0.2 0.0 - 0.5 K/uL   Basophils Relative 1 %   Basophils Absolute 0.1 0.0 - 0.1 K/uL   Immature Granulocytes 2 %   Abs Immature Granulocytes 0.14 (H) 0.00 - 0.07 K/uL    Comment: Performed at [facility], [address] [place], [place] [address]  Lipase, blood     Status: None   Collection Time: 01/02/19  8:14 PM  Result Value Ref Range   Lipase 47 11 - 51 U/L    Comment: Performed at [facility], [address] [place], [place] [address]  Procalcitonin     Status: None   Collection Time: 01/02/19  8:14 PM  Result Value Ref Range   Procalcitonin 0.12 ng/mL    Comment:        Interpretation: PCT (Procalcitonin) <= 0.5 ng/mL: Systemic infection (sepsis) is not likely. Local bacterial infection is possible. (NOTE)       Sepsis PCT Algorithm           Lower Respiratory Tract                                      Infection PCT Algorithm    ----------------------------     ----------------------------         PCT < 0.25 ng/mL                PCT < 0.10 ng/mL         Strongly encourage             Strongly discourage   discontinuation of antibiotics    initiation of antibiotics    ----------------------------     -----------------------------       PCT 0.25 - 0.50 ng/mL            PCT 0.10 - 0.25 ng/mL               OR       >80% decrease in PCT            Discourage initiation of                                            antibiotics      Encourage discontinuation           of antibiotics    ----------------------------     -----------------------------         PCT >= 0.50 ng/mL              PCT 0.26 - 0.50 ng/mL               AND        <80% decrease in PCT             Encourage initiation of  antibiotics       Encourage continuation           of antibiotics    ----------------------------     -----------------------------        PCT >= 0.50 ng/mL                  PCT > 0.50 ng/mL               AND         increase in PCT                  Strongly encourage                                      initiation of antibiotics    Strongly encourage escalation           of antibiotics                                     -----------------------------                                           PCT <= 0.25 ng/mL                                                 OR                                        > 80% decrease in PCT                                     Discontinue / Do not initiate                                             antibiotics Performed at [facility], [address] [place], [place] [address]   APTT     Status: None   Collection Time: 01/02/19  8:14 PM  Result Value Ref Range   aPTT 34 24 - 36 seconds    Comment: Performed at [facility], [address] [place], [place] [address]  Protime-INR     Status: Abnormal   Collection Time: 01/02/19  8:14 PM  Result Value Ref Range   Prothrombin Time 15.8 (H) 11.4 - 15.2 seconds   INR 1.3 (H) 0.8 - 1.2    Comment: (NOTE) INR goal varies based on device and disease states. Performed at [facility], [address] [place], [place] [address]   SARS Coronavirus 2 (CEPHEID- Performed in [facility] hospital lab), [facility] Order     Status: None   Collection Time: 01/02/19  8:52 PM   Specimen: Nasopharyngeal Swab  Result Value Ref Range   SARS Coronavirus 2 NEGATIVE NEGATIVE    Comment: (NOTE)  If result is NEGATIVE SARS-CoV-2 target nucleic acids are NOT DETECTED. The SARS-CoV-2 RNA is generally detectable in upper and lower  respiratory specimens during the acute phase of infection. The lowest  concentration of SARS-CoV-2 viral copies this assay can detect is 250   copies / mL. A negative result does not preclude SARS-CoV-2 infection  and should not be used as the sole basis for treatment or other  patient management decisions.  A negative result may occur with  improper specimen collection / handling, submission of specimen other  than nasopharyngeal swab, presence of viral mutation(s) within the  areas targeted by this assay, and inadequate number of viral copies  (<250 copies / mL). A negative result must be combined with clinical  observations, patient history, and epidemiological information. If result is POSITIVE SARS-CoV-2 target nucleic acids are DETECTED. The SARS-CoV-2 RNA is generally detectable in upper and lower  respiratory specimens dur ing the acute phase of infection.  Positive  results are indicative of active infection with SARS-CoV-2.  Clinical  correlation with patient history and other diagnostic information is  necessary to determine patient infection status.  Positive results do  not rule out bacterial infection or co-infection with other viruses. If result is PRESUMPTIVE POSTIVE SARS-CoV-2 nucleic acids MAY BE PRESENT.   A presumptive positive result was obtained on the submitted specimen  and confirmed on repeat testing.  While 2019 novel coronavirus  (SARS-CoV-2) nucleic acids may be present in the submitted sample  additional confirmatory testing may be necessary for epidemiological  and / or clinical management purposes  to differentiate between  SARS-CoV-2 and other Sarbecovirus currently known to infect humans.  If clinically indicated additional testing with an alternate test  methodology [id number]) is advised. The SARS-CoV-2 RNA is generally  detectable in upper and lower respiratory sp ecimens during the acute  phase of infection. The expected result is Negative. Fact Sheet for Patients:  StrictlyIdeas.no Fact Sheet for Healthcare  Providers: BankingDealers.co.za This test is not yet approved or cleared by the [place] FDA and has been authorized for detection and/or diagnosis of SARS-CoV-2 by FDA under an Emergency Use Authorization (EUA).  This EUA will remain in effect (meaning this test can be used) for the duration of the COVID-19 declaration under Section 564(b)(1) of the Act, 21 U.S.C. section 360bbb-3(b)(1), unless the authorization is terminated or revoked sooner. Performed at [facility], [address] [place], [place] [address]   Urinalysis, Complete w Microscopic     Status: Abnormal   Collection Time: 01/02/19  8:52 PM  Result Value Ref Range   Color, Urine YELLOW (A) YELLOW   APPearance CLOUDY (A) CLEAR   Specific Gravity, Urine 1.014 1.005 - 1.030   pH 5.0 5.0 - 8.0   Glucose, UA 50 (A) NEGATIVE mg/dL   Hgb urine dipstick LARGE (A) NEGATIVE   Bilirubin Urine NEGATIVE NEGATIVE   Ketones, ur NEGATIVE NEGATIVE mg/dL   Protein, ur 100 (A) NEGATIVE mg/dL   Nitrite NEGATIVE NEGATIVE   Leukocytes,Ua MODERATE (A) NEGATIVE   RBC / HPF >50 (H) 0 - 5 RBC/hpf   WBC, UA 11-20 0 - 5 WBC/hpf   Bacteria, UA RARE (A) NONE SEEN   Squamous Epithelial / LPF NONE SEEN 0 - 5   Mucus PRESENT    Amorphous Crystal PRESENT     Comment: Performed at [facility], [address] [place], [place] [address]  Lactic acid, plasma     Status: None   Collection  Time: 01/02/19 10:28 PM  Result Value Ref Range   Lactic Acid, Venous 1.4 0.5 - 1.9 mmol/L    Comment: Performed at [facility], [place], [place] [address]   Ct Abdomen Pelvis Wo Contrast  Result Date: 01/03/2019 CLINICAL DATA:  85 year old male with persistent cough. Recent nephrostomy placement. EXAM: CT CHEST, ABDOMEN AND PELVIS WITHOUT CONTRAST TECHNIQUE: Multidetector CT imaging of the chest, abdomen and pelvis was performed following the standard protocol without IV contrast.  COMPARISON:  CT of the abdomen pelvis dated 01/01/2019 FINDINGS: Evaluation of this exam is limited in the absence of intravenous contrast. Evaluation is also limited due to streak artifact caused by patient's arms. CT CHEST FINDINGS Cardiovascular: There is no cardiomegaly or pericardial effusion. Multi vessel coronary vascular calcification. Left pectoral pacemaker device. Advanced atherosclerotic calcification of the thoracic aorta. No aneurysmal dilatation. The central pulmonary arteries are grossly unremarkable. Right-sided PICC with tip in the central SVC. Mediastinum/Nodes: No hilar or mediastinal adenopathy. There is a small hiatal hernia. The esophagus is slightly patulous and contains debris. No mediastinal fluid collection. Lungs/Pleura: Small right pleural effusion. Bilateral lower lobe predominant airspace densities, new since the prior CT most consistent with pneumonia. Aspiration is not excluded. Clinical correlation is recommended. There is a 9 mm right upper lobe nodule. No pneumothorax. Small amount of debris or mucous material noted in the left mainstem bronchus. Musculoskeletal: Osteopenia with degenerative changes of the spine. Bilateral carotid bulb calcified plaques. No acute osseous pathology. Multilevel anterior bridging osteophyte and syndesmophyte may represent DISH or related to ankylosing spondylitis. No acute osseous pathology. CT ABDOMEN PELVIS FINDINGS No intra-abdominal free air or free fluid. Hepatobiliary: No focal liver abnormality is seen. No gallstones, gallbladder wall thickening, or biliary dilatation. Pancreas: Unremarkable. No pancreatic ductal dilatation or surrounding inflammatory changes. Spleen: Normal in size without focal abnormality. Adrenals/Urinary Tract: The adrenal glands are unremarkable. Similar position of bilateral nephrostomy tubes. There is no hydronephrosis on either side. There is high attenuating content in the right renal collecting system and pelvis  extending down in the right ureter suspicious for blood product. Correlation with urinalysis recommended. There is no hydronephrosis on either side. The urinary bladder is collapsed. Stomach/Bowel: There is extensive sigmoid diverticulosis and scattered colonic diverticula without active inflammatory changes. No bowel obstruction or active inflammation. No evidence of acute appendicitis. Vascular/Lymphatic: Advanced aortoiliac atherosclerotic disease. Infrarenal abdominal aortic aneurysm measures up to 5.5 cm in greatest diameter. This is essentially unchanged compared to the prior CT by my measurements. Bilateral common iliac artery aneurysms measure 2.6 cm on the right and 2.4 cm on the left. Evaluation of the vasculature is limited in the absence of intravenous contrast. The IVC is unremarkable. No portal venous gas. There is no adenopathy. Reproductive: Prostate brachytherapy seeds. Other: None Musculoskeletal: Osteopenia with degenerative changes of the spine. No acute osseous pathology. IMPRESSION: 1. Interval development of bilateral lower lobe predominant airspace densities most consistent with pneumonia and possible aspiration. Clinical correlation is recommended. Small right pleural effusion. 2. High attenuating content in the right renal collecting system and right ureter suspicious for blood product. Correlation with urinalysis recommended. No hydronephrosis on either side. 3. Stable position of bilateral nephrostomy tubes. 4. Extensive sigmoid and colonic diverticulosis without active inflammatory changes. No bowel obstruction. 5. Advanced aortoiliac atherosclerotic disease with a 5.5 cm infrarenal abdominal aortic aneurysm and bilateral common iliac artery aneurysms. Recommend followup by abdomen and pelvis CTA in 3-6 months, and vascular surgery referral/consultation if not already obtained.  This recommendation follows ACR consensus guidelines: White Paper of the ACR Incidental Findings Committee II  on Vascular Findings. J Am Coll Radiol 2013; 10:789-794. Electronically Signed   By: Anner Crete M.D.   On: 01/03/2019 00:27   Ct Abdomen Pelvis Wo Contrast  Result Date: 01/01/2019 CLINICAL DATA:  Status post right nephrostomy EXAM: CT ABDOMEN AND PELVIS WITHOUT CONTRAST TECHNIQUE: Multidetector CT imaging of the abdomen and pelvis was performed following the standard protocol without IV contrast. COMPARISON:  CT abdomen pelvis, 12/25/2018 FINDINGS: Lower chest: Improved bibasilar pleural effusions, now minimal. Associated scarring or atelectasis. Coronary artery calcifications. Hepatobiliary: No solid liver abnormality is seen. No gallstones, gallbladder wall thickening, or biliary dilatation. Pancreas: Unremarkable. No pancreatic ductal dilatation or surrounding inflammatory changes. Spleen: Normal in size without significant abnormality. Adrenals/Urinary Tract: Adrenal glands are unremarkable. Interval placement of bilateral percutaneous nephrostomy tubes, with formed pigtails in the renal pelves and interval resolution of previously seen hydronephrosis. The urinary bladder remains decompressed by a Foley catheter. Brachytherapy pellets. Stomach/Bowel: Stomach is within normal limits. No evidence of bowel wall thickening, distention, or inflammatory changes. Generally large burden of stool in the colon with large stool ball in rectum. Pancolonic diverticulosis. 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 No enlarged abdominal or pelvic lymph nodes. Reproductive: Brachytherapy pellets. Other: No abdominal wall hernia or abnormality. No abdominopelvic ascites. 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 IMPRESSION: 1. Interval placement of bilateral percutaneous nephrostomy tubes, with formed pigtails in the renal pelves and interval resolution of previously seen hydronephrosis. The urinary bladder remains  decompressed by a Foley catheter. 2.  Improved bilateral pleural effusions, now minimal. 3. Other chronic, incidental, and postoperative findings as detailed above. Electronically Signed   By: Eddie Candle M.D.   On: 01/01/2019 14:50   Ct Head Wo Contrast  Result Date: 01/03/2019 CLINICAL DATA:  Seizure. EXAM: CT HEAD WITHOUT CONTRAST TECHNIQUE: Contiguous axial images were obtained from the base of the skull through the vertex without intravenous contrast. COMPARISON:  November 05, 2018. FINDINGS: Brain: No evidence of acute infarction, hemorrhage, hydrocephalus, extra-axial collection or mass lesion/mass effect. Volume loss and chronic microvascular ischemic changes are noted. Vascular: No hyperdense vessel or unexpected calcification. Skull: Normal. Negative for fracture or focal lesion. Sinuses/Orbits: No acute finding. Other: None. IMPRESSION: 1. No acute intracranial abnormality. 2. Again identified are chronic microvascular ischemic changes and extensive volume loss, similar to prior study dated November 05, 2018. Electronically Signed   By: Constance Holster M.D.   On: 01/03/2019 00:32   Ct Chest Wo Contrast  Result Date: 01/03/2019 CLINICAL DATA:  85 year old male with persistent cough. Recent nephrostomy placement. EXAM: CT CHEST, ABDOMEN AND PELVIS WITHOUT CONTRAST TECHNIQUE: Multidetector CT imaging of the chest, abdomen and pelvis was performed following the standard protocol without IV contrast. COMPARISON:  CT of the abdomen pelvis dated 01/01/2019 FINDINGS: Evaluation of this exam is limited in the absence of intravenous contrast. Evaluation is also limited due to streak artifact caused by patient's arms. CT CHEST FINDINGS Cardiovascular: There is no cardiomegaly or pericardial effusion. Multi vessel coronary vascular calcification. Left pectoral pacemaker device. Advanced atherosclerotic calcification of the thoracic aorta. No aneurysmal dilatation. The central pulmonary arteries are grossly  unremarkable. Right-sided PICC with tip in the central SVC. Mediastinum/Nodes: No hilar or mediastinal adenopathy. There is a small hiatal hernia. The esophagus is slightly patulous and contains debris. No mediastinal fluid collection. Lungs/Pleura: Small right pleural effusion. Bilateral  lower lobe predominant airspace densities, new since the prior CT most consistent with pneumonia. Aspiration is not excluded. Clinical correlation is recommended. There is a 9 mm right upper lobe nodule. No pneumothorax. Small amount of debris or mucous material noted in the left mainstem bronchus. Musculoskeletal: Osteopenia with degenerative changes of the spine. Bilateral carotid bulb calcified plaques. No acute osseous pathology. Multilevel anterior bridging osteophyte and syndesmophyte may represent DISH or related to ankylosing spondylitis. No acute osseous pathology. CT ABDOMEN PELVIS FINDINGS No intra-abdominal free air or free fluid. Hepatobiliary: No focal liver abnormality is seen. No gallstones, gallbladder wall thickening, or biliary dilatation. Pancreas: Unremarkable. No pancreatic ductal dilatation or surrounding inflammatory changes. Spleen: Normal in size without focal abnormality. Adrenals/Urinary Tract: The adrenal glands are unremarkable. Similar position of bilateral nephrostomy tubes. There is no hydronephrosis on either side. There is high attenuating content in the right renal collecting system and pelvis extending down in the right ureter suspicious for blood product. Correlation with urinalysis recommended. There is no hydronephrosis on either side. The urinary bladder is collapsed. Stomach/Bowel: There is extensive sigmoid diverticulosis and scattered colonic diverticula without active inflammatory changes. No bowel obstruction or active inflammation. No evidence of acute appendicitis. Vascular/Lymphatic: Advanced aortoiliac atherosclerotic disease. Infrarenal abdominal aortic aneurysm measures up to 5.5  cm in greatest diameter. This is essentially unchanged compared to the prior CT by my measurements. Bilateral common iliac artery aneurysms measure 2.6 cm on the right and 2.4 cm on the left. Evaluation of the vasculature is limited in the absence of intravenous contrast. The IVC is unremarkable. No portal venous gas. There is no adenopathy. Reproductive: Prostate brachytherapy seeds. Other: None Musculoskeletal: Osteopenia with degenerative changes of the spine. No acute osseous pathology. IMPRESSION: 1. Interval development of bilateral lower lobe predominant airspace densities most consistent with pneumonia and possible aspiration. Clinical correlation is recommended. Small right pleural effusion. 2. High attenuating content in the right renal collecting system and right ureter suspicious for blood product. Correlation with urinalysis recommended. No hydronephrosis on either side. 3. Stable position of bilateral nephrostomy tubes. 4. Extensive sigmoid and colonic diverticulosis without active inflammatory changes. No bowel obstruction. 5. Advanced aortoiliac atherosclerotic disease with a 5.5 cm infrarenal abdominal aortic aneurysm and bilateral common iliac artery aneurysms. Recommend followup by abdomen and pelvis CTA in 3-6 months, and vascular surgery referral/consultation if not already obtained. This recommendation follows ACR consensus guidelines: White Paper of the ACR Incidental Findings Committee II on Vascular Findings. J Am Coll Radiol 2013; 10:789-794. Electronically Signed   By: Anner Crete M.D.   On: 01/03/2019 00:27   Dg Chest Portable 1 View  Result Date: 01/02/2019 CLINICAL DATA:  85 year old male with cough. EXAM: PORTABLE CHEST 1 VIEW COMPARISON:  Chest radiograph dated 12/25/2018 FINDINGS: Right-sided PICC with tip over the mediastinum at the level of the aortic arch. There is diffuse chronic interstitial coarsening. Bibasilar linear atelectasis/scarring noted. There has been  interval clearing of the previously seen bibasilar densities. No focal consolidation, pleural effusion, or pneumothorax. Stable cardiac silhouette. Left pectoral pacemaker device. Atherosclerotic calcification of the aorta. Osteopenia with degenerative changes of the spine. No acute osseous pathology. IMPRESSION: 1. No acute cardiopulmonary process. 2. Right-sided PICC with tip over the mediastinum at the level of the aortic arch. Electronically Signed   By: Anner Crete M.D.   On: 01/02/2019 20:41   Dg Nephrostogram Right Thru Existing Access  Result Date: 01/02/2019 INDICATION: Bilateral nephrostomies, right nephrostomy bloody output EXAM: RIGHT NEPHROSTOGRAM THROUGH EXISTING NEPHROSTOMY  COMPARISON:  12/29/2018, 01/01/2019 MEDICATIONS: None. ANESTHESIA/SEDATION: Moderate Sedation Time:  None. The patient was continuously monitored during the procedure by the interventional radiology nurse under my direct supervision. CONTRAST:  20 cc dilute Omnipaque 300-administered into the collecting system(s) FLUOROSCOPY TIME:  Fluoroscopy Time: 0 minutes 24 seconds (9 mGy). COMPLICATIONS: None immediate. PROCEDURE: Informed written consent was obtained from the patient after a thorough discussion of the procedural risks, benefits and alternatives. All questions were addressed. Maximal Sterile Barrier Technique was utilized including caps, mask, sterile gowns, sterile gloves, sterile drape, hand hygiene and skin antiseptic. A timeout was performed prior to the initiation of the procedure. Under sterile conditions, the existing right nephrostomy was injected with contrast. Fluoroscopic imaging performed. Nephrostomy retention loop in the renal pelvis in good position. Mild hydronephrosis noted. No evidence of contrast extravasation or communication to any vascular source. No obvious site of bleeding. IMPRESSION: Right nephrostomy in good position. No signs of active bleeding or contrast extravasation. Electronically  Signed   By: Jerilynn Mages.  Shick M.D.   On: 01/02/2019 15:15   Ir Nephrostomy Placement Right  Result Date: 01/01/2019 INDICATION: 85 year old male with a history of bilateral ureteral occlusion EXAM: IR NEPHROSTOMY PLACEMENT RIGHT; ULTRAOUND INTRAOPERATIVE - NRPT [facility] COMPARISON:  None. MEDICATIONS: None ANESTHESIA/SEDATION: Fentanyl 50 mcg IV; Versed 1 mg IV Moderate Sedation Time:  20 minutes The patient was continuously monitored during the procedure by the interventional radiology nurse under my direct supervision. CONTRAST:  10 cc-administered into the collecting system(s) FLUOROSCOPY TIME:  Fluoroscopy Time: 3 minutes 0 seconds COMPLICATIONS: None PROCEDURE: Informed written consent was obtained from the patient's family after a thorough discussion of the procedural risks, benefits and alternatives. All questions were addressed. Maximal Sterile Barrier Technique was utilized including caps, mask, sterile gowns, sterile gloves, sterile drape, hand hygiene and skin antiseptic. A timeout was performed prior to the initiation of the procedure. Patient positioned prone position on the fluoroscopy table. Ultrasound survey of the right flank was performed with images stored and sent to PACs. The patient was then prepped and draped in the usual sterile fashion. 1% lidocaine was used to anesthetize the skin and subcutaneous tissues for local anesthesia. A Chiba needle was then used to access a posterior inferior calyx with ultrasound guidance. With spontaneous urine returned through the needle, passage of an 018 micro wire into the collecting system was performed under fluoroscopy. A small incision was made with an 11 blade scalpel, and the needle was removed from the wire. An Accustick system was then advanced over the wire into the collecting system under fluoroscopy. The metal stiffener and inner dilator were removed, and then a sample of fluid was aspirated through the 4 French outer sheath. Bentson wire was passed into  the collecting system and the sheath removed. Ten French dilation of the soft tissues was performed. Using modified Seldinger technique, a 10 French pigtail catheter drain was placed over the Bentson wire. Wire and inner stiffener removed, and the pigtail was formed in the collecting system. Small amount of contrast confirmed position of the catheter. Patient tolerated the procedure well and remained hemodynamically stable throughout. No complications were encountered and no significant blood loss encountered IMPRESSION: Status post right percutaneous nephrostomy. Signed, Dulcy Fanny. Dellia Nims, RPVI [facility] Radiology Electronically Signed   By: Corrie Mckusick D.O.   On: 01/01/2019 07:42    Pending Labs Unresulted Labs (From admission, onward)    Start     Ordered   01/03/19 2200  Vancomycin, random  Once-Timed,   STAT     01/02/19 2046   01/03/19 6948  Basic metabolic panel  Tomorrow morning,   STAT     01/03/19 0142   01/03/19 0500  CBC  Tomorrow morning,   STAT     01/03/19 0142   01/03/19 0237  Troponin I (High Sensitivity)  STAT Now then every 3 hours,   STAT    Question Answer Comment  Indication Other   Specify indication A. fib with RVR      01/03/19 0237   01/03/19 0144  Magnesium  Add-on,   AD    Comments: For level less than 2 : give 2 g IV magnesium sulfate    01/03/19 0143   01/02/19 2007  Blood Culture (routine x 2)  BLOOD CULTURE X 2,   STAT     01/02/19 2006   01/02/19 2007  Urine culture  ONCE - STAT,   STAT     01/02/19 2006          Vitals/Pain Today's Vitals   01/03/19 0100 01/03/19 0115 01/03/19 0215 01/03/19 0230  BP: (!) 142/76 129/77 (!) 114/95 (!) 149/130  Pulse: (!) 45 (!) 31 (!) 32 (!) 33  Resp: (!) 31 (!) 27 20 (!) 28  Temp:      TempSrc:      SpO2: 91% (!) 86% (!) 85% (!) 82%  PainSc:        Isolation Precautions No active isolations  Medications Medications  vancomycin variable dose per  unstable renal function (pharmacist dosing) (has no administration in time range)  ceFEPIme (MAXIPIME) 1 g in sodium chloride 0.9 % 100 mL IVPB (has no administration in time range)  amLODipine (NORVASC) tablet 2.5 mg (has no administration in time range)  Metoprolol Tartrate TABS 37.5 mg (has no administration in time range)  rosuvastatin (CRESTOR) tablet 5 mg (has no administration in time range)  linagliptin (TRADJENTA) tablet 5 mg (has no administration in time range)  pantoprazole (PROTONIX) EC tablet 40 mg (has no administration in time range)  sodium bicarbonate tablet 650 mg (has no administration in time range)  finasteride (PROSCAR) tablet 5 mg (has no administration in time range)  tamsulosin (FLOMAX) capsule 0.4 mg (has no administration in time range)  Iron-Vitamin C 65-125 MG TABS (has no administration in time range)  feeding supplement (GLUCERNA SHAKE) (GLUCERNA SHAKE) liquid 237 mL (has no administration in time range)  PreserVision/Lutein CAPS (has no administration in time range)  multivitamin with minerals tablet 1 tablet (has no administration in time range)  metroNIDAZOLE (FLAGYL) IVPB 500 mg (has no administration in time range)  potassium chloride (KLOR-CON) packet 40 mEq (has no administration in time range)  0.9 %  sodium chloride infusion (has no administration in time range)  acetaminophen (TYLENOL) tablet 650 mg (has no administration in time range)    Or  acetaminophen (TYLENOL) suppository 650 mg (has no administration in time range)  traZODone (DESYREL) tablet 25 mg (has no administration in time range)  magnesium hydroxide (MILK OF MAGNESIA) suspension 30 mL (has no administration in time range)  ondansetron (ZOFRAN) tablet 4 mg (has no administration in time range)    Or  ondansetron (ZOFRAN) injection 4 mg (has no administration in time range)  aspirin EC tablet 81 mg (has no administration in time range)  LORazepam (ATIVAN) injection 1 mg (has no  administration in time range)  heparin injection 5,000 Units (has no administration in time range)  digoxin (  LANOXIN) 0.25 MG/ML injection 0.25 mg (has no administration in time range)  sodium chloride 0.9 % bolus 1,000 mL (0 mLs Intravenous Stopped 01/02/19 2231)  metroNIDAZOLE (FLAGYL) IVPB 500 mg (0 mg Intravenous Stopped 01/02/19 2131)  vancomycin (VANCOCIN) IVPB 1000 mg/200 mL premix (0 mg Intravenous Stopped 01/02/19 2131)  ondansetron (ZOFRAN) injection 4 mg (4 mg Intravenous Given 01/02/19 2023)  vancomycin (VANCOCIN) IVPB 1000 mg/200 mL premix (0 mg Intravenous Stopped 01/02/19 2234)    Mobility non-ambulatory Moderate fall risk   Focused Assessments Neuro Assessment Handoff:  Swallow screen pass? Yes  Cardiac Rhythm: Atrial fibrillation   Last date known well: 01/02/19   Neuro Assessment:   Neuro Checks:      Last Documented NIHSS Modified Score:   Has TPA been given? No If patient is a Neuro Trauma and patient is going to OR before floor call report to [place] nurse: [phone] or [phone]     R Recommendations: See Admitting Provider Note  Report given to:   Additional Notes:

## 2019-01-03 NOTE — Evaluation (Addendum)
Clinical/Bedside Swallow Evaluation Patient Details  Name: Blake [name] MRN: [id number] Date of Birth: May 21, 1923  Today's Date: 01/03/2019 Time: SLP Start Time (ACUTE ONLY): 1140 SLP Stop Time (ACUTE ONLY): 1240 SLP Time Calculation (min) (ACUTE ONLY): 60 min  Past Medical History:  Past Medical History:  Diagnosis Date  . AAA (abdominal aortic aneurysm) ([place])   . Arrhythmia   . CHF (congestive heart failure) ([place])   . Diabetes mellitus without complication ([place])   . GI bleed   . Heart murmur   . Hyperlipidemia   . Hypertension   . Pacemaker   . Prostate cancer [facility])    Past Surgical History:  Past Surgical History:  Procedure Laterality Date  . FLEXIBLE SIGMOIDOSCOPY N/A 10/21/2016   Procedure: FLEXIBLE SIGMOIDOSCOPY;  Surgeon: [name], MD;  Location: [facility] ENDOSCOPY;  Service: Endoscopy;  Laterality: N/A;  . IR NEPHROSTOMY PLACEMENT RIGHT  01/01/2019  . JOINT REPLACEMENT    . NEPHROSTOMY Bilateral   . PACEMAKER INSERTION Left 04/14/2015   Procedure: INSERTION PACEMAKER;  Surgeon: Isaias Cowman, MD;  Location: [facility] ORS;  Service: Cardiovascular;  Laterality: Left;  . PROSTATE SURGERY    . VISCERAL ARTERY INTERVENTION N/A 10/22/2016   Procedure: Visceral Artery Intervention;  Surgeon: Algernon Huxley, MD;  Location: [place] CV LAB;  Service: Cardiovascular;  Laterality: N/A;   HPI:  Pt is a 85 y.o. male with a known history of diabetes type 2, chronic kidney disease, triple AAA, hyperlipidemia, hypertension, prostate cancer who was recently hospitalized w/ UTI and Acute on Chronic renal failure III- secondary to obstruction from bilateral hydroureteronephrosis with h/o Bladder cancer.  Pt also has Acute on chronic diastolic CHF-this is the source of patient's worsening shortness of breath per MD notes.  He received bilateral nephrostomy and PICC line placement for which he has been getting IV cefepime.  He had a seizure episode after going home and this was new onset for  him.  Upon arrival of EMS he had another seizure episode and has been postictal since then till arrival to the ER.  CXR: Interval development of bilateral lower lobe predominant airspace disease.  Currently, he is more alert but presents w/ confusion and required Mod+ cues for follow through w/ tasks and answering questions - unsure of pt's Baseline Cognitive functioning; MRI: identified are chronic microvascular ischemic changes and extensive volume loss, similar to prior study dated November 05, 2018.    Assessment / Plan / Recommendation Clinical Impression  Pt appears to present w/ an adequate oropharyngeal phase swallow function w/ no overt s/s of aspiration noted during this evaluation; reduced risk for aspiration when following general aspiration precautions. Pt does have Cognitive decline at this assessment(see MRI; unsure of pt's baseline status). He also has baseline GERD/esophageal dysmotility, Reflux as per previous assessment/note, and pt is on a PPI at baseline. With any episodes of Regurgitation of REFLUX, potential aspiration of REFLUX can occur and can result in pulmonary/respiratory decline from the negative sequelae of such. Pt helped to position upright in bed for assessment. OM exam appeared grossly [facility] w/ lingual/labial strength in response to stimulation. He began th evaluation w/out Dentures placed but did not ask for such even though he stated he "eat w/ my Dentures in". SLP cleaned/placed the Dentures. Pt consumed po trials of thin and Nectar liquids, purees, and soft solids w/ no overt s/s of aspiration noted; no decline in vocal quality or respiratory status noted during/post trials. Laryngeal excursion during the swallow appeared [facility]. Oral  phase appeared grossly [facility] for bolus management and timely A-P transfer; oral clearing was complete post swallows. Pt did appear to exhibit min increased time masticating the increased texture though it was presented in small, single moistened bites.  Pt fed self the drinks holding the Cup but was unsteady and full support given.  Recommend modifying the current diet to a Mech Soft foods diet w/ thin liquids w/ general aspiration precautions; Reflux precautions. Discussed w/ NSG giving Pills in Puree (crushed vs whole) for easier, safer swallowing from this time forward in light of Cognitive status and advanced Age.  ST services will monitor pt's status while admitted. MD updated.  SLP Visit Diagnosis: Dysphagia, oral phase (R13.11)(impacted by Cognitive status - suspected; Dentures+)    Aspiration Risk  Mild aspiration risk - advanced age, Cognitive status(but reduced following general aspiration precautions)    Diet Recommendation  Mech Soft diet w/ Thin liquids; general aspiration precautions; Reflux precautions; feeding support at meals d/t Cognitive status  Medication Administration: Whole meds with puree(but Crushed as needed)    Other  Recommendations Recommended Consults: (n/a) Oral Care Recommendations: Oral care BID;Staff/trained caregiver to provide oral care Other Recommendations: (n/a)   Follow up Recommendations None      Frequency and Duration min 1 x/week  1 week       Prognosis Prognosis for Safe Diet Advancement: Good Barriers to Reach Goals: Cognitive deficits      Swallow Study   General Date of Onset: 01/02/19 HPI: Pt is a 85 y.o. male with a known history of diabetes type 2, chronic kidney disease, triple AAA, hyperlipidemia, hypertension, prostate cancer who was recently hospitalized w/ UTI and Acute on Chronic renal failure III- secondary to obstruction from bilateral hydroureteronephrosis with h/o Bladder cancer.  Pt also has Acute on chronic diastolic CHF-this is the source of patient's worsening shortness of breath per MD notes.  He received bilateral nephrostomy and PICC line placement for which he has been getting IV cefepime.  He had a seizure episode after going home and this was new onset for him.  Upon  arrival of EMS he had another seizure episode and has been postictal since then till arrival to the ER.  CXR: Interval development of bilateral lower lobe predominant airspace disease.  Currently, he is more alert but presents w/ confusion and required Mod+ cues for follow through w/ tasks and answering questions - unsure of pt's Baseline Cognitive functioning; MRI: identified are chronic microvascular ischemic changes and extensive volume loss, similar to prior study dated November 05, 2018.  Type of Study: Bedside Swallow Evaluation Previous Swallow Assessment: 2018 Diet Prior to this Study: Regular;Thin liquids Temperature Spikes Noted: No(wbc 20.4) Respiratory Status: Nasal cannula(2 liters) History of Recent Intubation: No Behavior/Cognition: Alert;Cooperative;Pleasant mood;Confused;Distractible;Requires cueing(few verbalizations) Oral Cavity Assessment: Dried secretions(min) Oral Care Completed by SLP: Yes Oral Cavity - Dentition: Dentures, top;Dentures, bottom(cleaned and placed by SLP) Vision: Functional for self-feeding(w/ holding cup to drink) Self-Feeding Abilities: Able to feed self;Needs assist;Needs set up;Total assist Patient Positioning: Upright in bed(needed full positioning) Baseline Vocal Quality: Normal Volitional Cough: Cognitively unable to elicit Volitional Swallow: Unable to elicit    Oral/Motor/Sensory Function Overall Oral Motor/Sensory Function: Within functional limits(w/ bolus management, clearing; Speech clear)   Ice Chips Ice chips: Within functional limits Presentation: Spoon(fed; 3 trials)   Thin Liquid Thin Liquid: Within functional limits Presentation: Cup;Self Fed(~4+ ozs) Other Comments: shaky UE holding the cup - needed support    Nectar Thick Nectar Thick Liquid: Within functional  limits Presentation: Cup;Self Fed(~4 ozs) Other Comments: needed support of cup   Honey Thick Honey Thick Liquid: Not tested   Puree Puree: Within functional  limits Presentation: Spoon(fed; ~3 ozs )   Solid     Solid: Impaired(min) Presentation: Spoon(fed; 7-8 trials) Oral Phase Impairments: Impaired mastication(needed Dentures placed (he did not ask for)) Oral Phase Functional Implications: Impaired mastication(min) Pharyngeal Phase Impairments: (none)       Orinda Kenner, MS, CCC-SLP Mariya Mottley 01/03/2019,3:58 PM

## 2019-01-03 NOTE — ED Notes (Signed)
Pt resting in NAD at this time. Breathing even and non-labored.

## 2019-01-03 NOTE — H&P (Addendum)
[name] NAME: Blake [name]    MR#:  [id number]  DATE OF BIRTH:  January 28, 1923  DATE OF ADMISSION:  01/03/2019  PRIMARY CARE PHYSICIAN: Adin Hector, MD   REQUESTING/REFERRING PHYSICIAN: Brenton Grills, MD  CHIEF COMPLAINT:   Chief Complaint  Patient presents with   Seizures    HISTORY OF PRESENT ILLNESS:  Blake [name]  is a 85 y.o. male with a known history of multiple medical problems that will be mentioned below, who was just discharged yesterday after being admitted here for Pseudomonas bacteremia with urinary obstruction status post bilateral nephrostomy and PICC line placement for which he has been getting IV cefepime.  He had a seizure episode after going home and this was new onset for him.  Upon arrival of EMS he had another seizure episode and has been postictal since then till arrival to the ER.  He denied any headache or dizziness or blurred vision, paresthesias or focal muscle weakness.  No nausea vomiting or abdominal pain.  No chest pain or dyspnea or palpitations.  No cough or wheezing or hemoptysis.  Upon presentation to the emergency room, respiratory to is 29 and heart rate was 126 with pulse ox entry of 89% on room air.  Labs revealed hypokalemia with a potassium of 3.2 and his BUN and creatinine were 76/4.95 comparable to yesterday with anion gap of 16 and blood glucose of 190.  CBC showed WBC of 9.3, platelets of 144 with hemoglobin of 7.7 hematocrit 25.4 compared to 9/29.6 on 12/30/2018.  Urinalysis showed 11-20 WBCs with more than 50 RBCs with 100 protein and 50 glucose.  Urine culture was sent as well as blood culture.  Lactic acid with 3 and later 1.4.  Portable chest x-ray revealed no acute cardiopulmonary process and right-sided PIC with tip over the mediastinum at the level of 3 aortic arch.  He had a head CT scan without contrast that showed small vessel ischemic changes with no acute abnormalities.   Abdominal and pelvic as well as chest CT revealed interval development of bilateral lower lobe predominant airspace densities most consistent with pneumonia and possible aspiration with a small right pleural effusion, stable position of bilateral nephrostomy tubes, extensive sigmoid and colonic diverticulosis without diverticulitis or bowel obstruction and it advanced aortobiiliac atherosclerotic disease with 5.5 cm infrarenal abdominal aortic aneurysm and bilateral common iliac artery aneurysm with recommendation for follow-up with pelvic CTA in 3 to 6 months and vascular surgery evaluation.  The patient was given IV Flagyl, vancomycin and Zofran as well as 1 L bolus of IV normal saline.  He will be admitted to a medical monitored bed for further evaluation and management. PAST MEDICAL HISTORY:   Past Medical History:  Diagnosis Date   AAA (abdominal aortic aneurysm) (HCC)    Arrhythmia    CHF (congestive heart failure) (HCC)    Diabetes mellitus without complication (HCC)    GI bleed    Heart murmur    Hyperlipidemia    Hypertension    Pacemaker    Prostate cancer ([place])     PAST SURGICAL HISTORY:   Past Surgical History:  Procedure Laterality Date   FLEXIBLE SIGMOIDOSCOPY N/A 10/21/2016   Procedure: FLEXIBLE SIGMOIDOSCOPY;  Surgeon: [name], MD;  Location: [facility] ENDOSCOPY;  Service: Endoscopy;  Laterality: N/A;   IR NEPHROSTOMY PLACEMENT RIGHT  01/01/2019   JOINT REPLACEMENT     NEPHROSTOMY Bilateral    PACEMAKER INSERTION Left 04/14/2015  Procedure: INSERTION PACEMAKER;  Surgeon: Isaias Cowman, MD;  Location: [facility] ORS;  Service: Cardiovascular;  Laterality: Left;   PROSTATE SURGERY     VISCERAL ARTERY INTERVENTION N/A 10/22/2016   Procedure: Visceral Artery Intervention;  Surgeon: Algernon Huxley, MD;  Location: [facility];  Service: Cardiovascular;  Laterality: N/A;    SOCIAL HISTORY:   Social History   Tobacco Use   Smoking status: Former  Smoker   Smokeless tobacco: Never Used  Substance Use Topics   Alcohol use: No    FAMILY HISTORY:   Family History  Problem Relation Age of Onset   Hypertension Other    Stroke Other    Heart attack Other    Stroke Sister    Heart attack Brother     DRUG ALLERGIES:   Allergies  Allergen Reactions   Diovan [Valsartan] Cough   Gabapentin Other (See Comments)    Sedation at all doses   Hydralazine Itching   Lisinopril Cough   Pregabalin Other (See Comments)    Sedation at all doses   Levofloxacin Other (See Comments)    Too many side effects. Nausea, vomiting, upset stomach, increased confusion, etc Other reaction(s): Confusion Nausea, chills Nausea, chills    Sulfamethoxazole-Trimethoprim Other (See Comments)    Too many side effects. Nausea, vomiting, upset stomach, increased confusion, etc    REVIEW OF SYSTEMS:   ROS As per history of present illness. All pertinent systems were reviewed above. Constitutional,  HEENT, cardiovascular, respiratory, GI, GU, musculoskeletal, neuro, psychiatric, endocrine,  integumentary and hematologic systems were reviewed and are otherwise  negative/unremarkable except for positive findings mentioned above in the HPI.   MEDICATIONS AT HOME:   Prior to Admission medications   Medication Sig Start Date End Date Taking? Authorizing Provider  amLODipine (NORVASC) 2.5 MG tablet Take 1 tablet (2.5 mg total) by mouth daily. 11/10/18  Yes Sudini, Alveta Heimlich, MD  ceFEPIme 1 g in sodium chloride 0.9 % 100 mL Inject 1 g into the vein daily for 16 days. 01/03/19 01/19/19 Yes Fritzi Mandes, MD  feeding supplement, GLUCERNA SHAKE, (GLUCERNA SHAKE) LIQD Take 237 mLs by mouth 2 (two) times daily between meals. 01/01/19  Yes Fritzi Mandes, MD  finasteride (PROSCAR) 5 MG tablet Take 5 mg by mouth daily.   Yes [provider]  Iron-Vitamin C (VITRON-C PO) Take 1 tablet by mouth daily.   Yes [provider]  metoprolol tartrate  37.5 MG TABS Take 37.5 mg by mouth 2 (two) times daily. 09/13/17  Yes Gladstone Lighter, MD  Multiple Vitamins-Minerals (PRESERVISION/LUTEIN PO) Take 1 capsule by mouth 2 (two) times daily.   Yes [provider]  multivitamin-iron-minerals-folic acid (CENTRUM) chewable tablet Chew 2 tablets by mouth daily.   Yes [provider]  pantoprazole (PROTONIX) 40 MG tablet Take 40 mg by mouth daily.   Yes [provider]  rosuvastatin (CRESTOR) 5 MG tablet Take 5 mg by mouth at bedtime.   Yes [provider]  sodium bicarbonate 650 MG tablet Take 1 tablet (650 mg total) by mouth 2 (two) times daily. 09/13/17  Yes Gladstone Lighter, MD  tamsulosin (FLOMAX) 0.4 MG CAPS capsule Take 0.4 mg by mouth daily.    Yes [provider]  sitaGLIPtin (JANUVIA) 50 MG tablet Take 0.5 tablets (25 mg total) by mouth daily. 01/01/19   Fritzi Mandes, MD      VITAL SIGNS:  Blood pressure 129/77, pulse (!) 31, temperature (!) 97.5 F (36.4 C), temperature source Axillary, resp. rate Marland Kitchen)  27, SpO2 (!) 86 %.  PHYSICAL EXAMINATION:  Physical Exam  GENERAL:  84 y.o.-year-old male patient lying in the bed with no acute distress.  EYES: Pupils equal, round, reactive to light and accommodation. No scleral icterus. Extraocular muscles intact.  HEENT: Head atraumatic, normocephalic. Oropharynx and nasopharynx clear.  NECK:  Supple, no jugular venous distention. No thyroid enlargement, no tenderness.  LUNGS: Diminished bibasilar breath sounds with mild bibasilar crackles.  No wheezing, rales,rhonchi or crepitation. No use of accessory muscles of respiration.  CARDIOVASCULAR: Irregularly irregular tachycardic rhythm, S1, S2 normal. No murmurs, rubs, or gallops.  ABDOMEN: Soft, nondistended, nontender. Bowel sounds present. No organomegaly or mass.  Intact bilateral nephrostomy tubes. EXTREMITIES: No pedal edema, cyanosis, or clubbing.  NEUROLOGIC: Cranial nerves II through XII are intact.  Muscle strength 5/5 in all extremities. Sensation intact. Gait not checked.  PSYCHIATRIC: The patient is alert and oriented x 3.  Normal affect and good eye contact. SKIN: No obvious rash, lesion, or ulcer.   LABORATORY PANEL:   CBC Recent Labs  Lab 01/02/19 2014  WBC 9.3  HGB 7.7*  HCT 25.4*  PLT 144*   ------------------------------------------------------------------------------------------------------------------  Chemistries  Recent Labs  Lab 01/02/19 2014  NA 138  K 3.2*  CL 100  CO2 22  GLUCOSE 190*  BUN 76*  CREATININE 4.95*  CALCIUM 8.6*  AST 25  ALT 20  ALKPHOS 63  BILITOT 0.3   ------------------------------------------------------------------------------------------------------------------  Cardiac Enzymes No results for input(s): TROPONINI in the last 168 hours. ------------------------------------------------------------------------------------------------------------------  RADIOLOGY:  Ct Abdomen Pelvis Wo Contrast  Result Date: 01/03/2019 CLINICAL DATA:  85 year old male with persistent cough. Recent nephrostomy placement. EXAM: CT CHEST, ABDOMEN AND PELVIS WITHOUT CONTRAST TECHNIQUE: Multidetector CT imaging of the chest, abdomen and pelvis was performed following the standard protocol without IV contrast. COMPARISON:  CT of the abdomen pelvis dated 01/01/2019 FINDINGS: Evaluation of this exam is limited in the absence of intravenous contrast. Evaluation is also limited due to streak artifact caused by patient's arms. CT CHEST FINDINGS Cardiovascular: There is no cardiomegaly or pericardial effusion. Multi vessel coronary vascular calcification. Left pectoral pacemaker device. Advanced atherosclerotic calcification of the thoracic aorta. No aneurysmal dilatation. The central pulmonary arteries are grossly unremarkable. Right-sided PICC with tip in the central SVC. Mediastinum/Nodes: No hilar or mediastinal adenopathy. There is a small hiatal hernia. The  esophagus is slightly patulous and contains debris. No mediastinal fluid collection. Lungs/Pleura: Small right pleural effusion. Bilateral lower lobe predominant airspace densities, new since the prior CT most consistent with pneumonia. Aspiration is not excluded. Clinical correlation is recommended. There is a 9 mm right upper lobe nodule. No pneumothorax. Small amount of debris or mucous material noted in the left mainstem bronchus. Musculoskeletal: Osteopenia with degenerative changes of the spine. Bilateral carotid bulb calcified plaques. No acute osseous pathology. Multilevel anterior bridging osteophyte and syndesmophyte may represent DISH or related to ankylosing spondylitis. No acute osseous pathology. CT ABDOMEN PELVIS FINDINGS No intra-abdominal free air or free fluid. Hepatobiliary: No focal liver abnormality is seen. No gallstones, gallbladder wall thickening, or biliary dilatation. Pancreas: Unremarkable. No pancreatic ductal dilatation or surrounding inflammatory changes. Spleen: Normal in size without focal abnormality. Adrenals/Urinary Tract: The adrenal glands are unremarkable. Similar position of bilateral nephrostomy tubes. There is no hydronephrosis on either side. There is high attenuating content in the right renal collecting system and pelvis extending down in the right ureter suspicious for blood product. Correlation with urinalysis recommended. There is no hydronephrosis on either side. The urinary  bladder is collapsed. Stomach/Bowel: There is extensive sigmoid diverticulosis and scattered colonic diverticula without active inflammatory changes. No bowel obstruction or active inflammation. No evidence of acute appendicitis. Vascular/Lymphatic: Advanced aortoiliac atherosclerotic disease. Infrarenal abdominal aortic aneurysm measures up to 5.5 cm in greatest diameter. This is essentially unchanged compared to the prior CT by my measurements. Bilateral common iliac artery aneurysms measure 2.6  cm on the right and 2.4 cm on the left. Evaluation of the vasculature is limited in the absence of intravenous contrast. The IVC is unremarkable. No portal venous gas. There is no adenopathy. Reproductive: Prostate brachytherapy seeds. Other: None Musculoskeletal: Osteopenia with degenerative changes of the spine. No acute osseous pathology. IMPRESSION: 1. Interval development of bilateral lower lobe predominant airspace densities most consistent with pneumonia and possible aspiration. Clinical correlation is recommended. Small right pleural effusion. 2. High attenuating content in the right renal collecting system and right ureter suspicious for blood product. Correlation with urinalysis recommended. No hydronephrosis on either side. 3. Stable position of bilateral nephrostomy tubes. 4. Extensive sigmoid and colonic diverticulosis without active inflammatory changes. No bowel obstruction. 5. Advanced aortoiliac atherosclerotic disease with a 5.5 cm infrarenal abdominal aortic aneurysm and bilateral common iliac artery aneurysms. Recommend followup by abdomen and pelvis CTA in 3-6 months, and vascular surgery referral/consultation if not already obtained. This recommendation follows ACR consensus guidelines: White Paper of the ACR Incidental Findings Committee II on Vascular Findings. J Am Coll Radiol 2013; 10:789-794. Electronically Signed   By: Anner Crete M.D.   On: 01/03/2019 00:27   Ct Abdomen Pelvis Wo Contrast  Result Date: 01/01/2019 CLINICAL DATA:  Status post right nephrostomy EXAM: CT ABDOMEN AND PELVIS WITHOUT CONTRAST TECHNIQUE: Multidetector CT imaging of the abdomen and pelvis was performed following the standard protocol without IV contrast. COMPARISON:  CT abdomen pelvis, 12/25/2018 FINDINGS: Lower chest: Improved bibasilar pleural effusions, now minimal. Associated scarring or atelectasis. Coronary artery calcifications. Hepatobiliary: No solid liver abnormality is seen. No gallstones,  gallbladder wall thickening, or biliary dilatation. Pancreas: Unremarkable. No pancreatic ductal dilatation or surrounding inflammatory changes. Spleen: Normal in size without significant abnormality. Adrenals/Urinary Tract: Adrenal glands are unremarkable. Interval placement of bilateral percutaneous nephrostomy tubes, with formed pigtails in the renal pelves and interval resolution of previously seen hydronephrosis. The urinary bladder remains decompressed by a Foley catheter. Brachytherapy pellets. Stomach/Bowel: Stomach is within normal limits. No evidence of bowel wall thickening, distention, or inflammatory changes. Generally large burden of stool in the colon with large stool ball in rectum. Pancolonic diverticulosis. 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 No enlarged abdominal or pelvic lymph nodes. Reproductive: Brachytherapy pellets. Other: No abdominal wall hernia or abnormality. No abdominopelvic ascites. 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 IMPRESSION: 1. Interval placement of bilateral percutaneous nephrostomy tubes, with formed pigtails in the renal pelves and interval resolution of previously seen hydronephrosis. The urinary bladder remains decompressed by a Foley catheter. 2.  Improved bilateral pleural effusions, now minimal. 3. Other chronic, incidental, and postoperative findings as detailed above. Electronically Signed   By: Eddie Candle M.D.   On: 01/01/2019 14:50   Ct Head Wo Contrast  Result Date: 01/03/2019 CLINICAL DATA:  Seizure. EXAM: CT HEAD WITHOUT CONTRAST TECHNIQUE: Contiguous axial images were obtained from the base of the skull through the vertex without intravenous contrast. COMPARISON:  November 05, 2018. FINDINGS: Brain: No evidence of acute infarction, hemorrhage, hydrocephalus, extra-axial collection or mass lesion/mass effect. Volume loss and  chronic microvascular  ischemic changes are noted. Vascular: No hyperdense vessel or unexpected calcification. Skull: Normal. Negative for fracture or focal lesion. Sinuses/Orbits: No acute finding. Other: None. IMPRESSION: 1. No acute intracranial abnormality. 2. Again identified are chronic microvascular ischemic changes and extensive volume loss, similar to prior study dated November 05, 2018. Electronically Signed   By: Constance Holster M.D.   On: 01/03/2019 00:32   Ct Chest Wo Contrast  Result Date: 01/03/2019 CLINICAL DATA:  85 year old male with persistent cough. Recent nephrostomy placement. EXAM: CT CHEST, ABDOMEN AND PELVIS WITHOUT CONTRAST TECHNIQUE: Multidetector CT imaging of the chest, abdomen and pelvis was performed following the standard protocol without IV contrast. COMPARISON:  CT of the abdomen pelvis dated 01/01/2019 FINDINGS: Evaluation of this exam is limited in the absence of intravenous contrast. Evaluation is also limited due to streak artifact caused by patient's arms. CT CHEST FINDINGS Cardiovascular: There is no cardiomegaly or pericardial effusion. Multi vessel coronary vascular calcification. Left pectoral pacemaker device. Advanced atherosclerotic calcification of the thoracic aorta. No aneurysmal dilatation. The central pulmonary arteries are grossly unremarkable. Right-sided PICC with tip in the central SVC. Mediastinum/Nodes: No hilar or mediastinal adenopathy. There is a small hiatal hernia. The esophagus is slightly patulous and contains debris. No mediastinal fluid collection. Lungs/Pleura: Small right pleural effusion. Bilateral lower lobe predominant airspace densities, new since the prior CT most consistent with pneumonia. Aspiration is not excluded. Clinical correlation is recommended. There is a 9 mm right upper lobe nodule. No pneumothorax. Small amount of debris or mucous material noted in the left mainstem bronchus. Musculoskeletal: Osteopenia with degenerative changes of the spine.  Bilateral carotid bulb calcified plaques. No acute osseous pathology. Multilevel anterior bridging osteophyte and syndesmophyte may represent DISH or related to ankylosing spondylitis. No acute osseous pathology. CT ABDOMEN PELVIS FINDINGS No intra-abdominal free air or free fluid. Hepatobiliary: No focal liver abnormality is seen. No gallstones, gallbladder wall thickening, or biliary dilatation. Pancreas: Unremarkable. No pancreatic ductal dilatation or surrounding inflammatory changes. Spleen: Normal in size without focal abnormality. Adrenals/Urinary Tract: The adrenal glands are unremarkable. Similar position of bilateral nephrostomy tubes. There is no hydronephrosis on either side. There is high attenuating content in the right renal collecting system and pelvis extending down in the right ureter suspicious for blood product. Correlation with urinalysis recommended. There is no hydronephrosis on either side. The urinary bladder is collapsed. Stomach/Bowel: There is extensive sigmoid diverticulosis and scattered colonic diverticula without active inflammatory changes. No bowel obstruction or active inflammation. No evidence of acute appendicitis. Vascular/Lymphatic: Advanced aortoiliac atherosclerotic disease. Infrarenal abdominal aortic aneurysm measures up to 5.5 cm in greatest diameter. This is essentially unchanged compared to the prior CT by my measurements. Bilateral common iliac artery aneurysms measure 2.6 cm on the right and 2.4 cm on the left. Evaluation of the vasculature is limited in the absence of intravenous contrast. The IVC is unremarkable. No portal venous gas. There is no adenopathy. Reproductive: Prostate brachytherapy seeds. Other: None Musculoskeletal: Osteopenia with degenerative changes of the spine. No acute osseous pathology. IMPRESSION: 1. Interval development of bilateral lower lobe predominant airspace densities most consistent with pneumonia and possible aspiration. Clinical  correlation is recommended. Small right pleural effusion. 2. High attenuating content in the right renal collecting system and right ureter suspicious for blood product. Correlation with urinalysis recommended. No hydronephrosis on either side. 3. Stable position of bilateral nephrostomy tubes. 4. Extensive sigmoid and colonic diverticulosis without active inflammatory changes. No bowel obstruction. 5. Advanced aortoiliac atherosclerotic disease  with a 5.5 cm infrarenal abdominal aortic aneurysm and bilateral common iliac artery aneurysms. Recommend followup by abdomen and pelvis CTA in 3-6 months, and vascular surgery referral/consultation if not already obtained. This recommendation follows ACR consensus guidelines: White Paper of the ACR Incidental Findings Committee II on Vascular Findings. J Am Coll Radiol 2013; 10:789-794. Electronically Signed   By: Anner Crete M.D.   On: 01/03/2019 00:27   Dg Chest Portable 1 View  Result Date: 01/02/2019 CLINICAL DATA:  85 year old male with cough. EXAM: PORTABLE CHEST 1 VIEW COMPARISON:  Chest radiograph dated 12/25/2018 FINDINGS: Right-sided PICC with tip over the mediastinum at the level of the aortic arch. There is diffuse chronic interstitial coarsening. Bibasilar linear atelectasis/scarring noted. There has been interval clearing of the previously seen bibasilar densities. No focal consolidation, pleural effusion, or pneumothorax. Stable cardiac silhouette. Left pectoral pacemaker device. Atherosclerotic calcification of the aorta. Osteopenia with degenerative changes of the spine. No acute osseous pathology. IMPRESSION: 1. No acute cardiopulmonary process. 2. Right-sided PICC with tip over the mediastinum at the level of the aortic arch. Electronically Signed   By: Anner Crete M.D.   On: 01/02/2019 20:41   Dg Nephrostogram Right Thru Existing Access  Result Date: 01/02/2019 INDICATION: Bilateral nephrostomies, right nephrostomy bloody output EXAM:  RIGHT NEPHROSTOGRAM THROUGH EXISTING NEPHROSTOMY COMPARISON:  12/29/2018, 01/01/2019 MEDICATIONS: None. ANESTHESIA/SEDATION: Moderate Sedation Time:  None. The patient was continuously monitored during the procedure by the interventional radiology nurse under my direct supervision. CONTRAST:  20 cc dilute Omnipaque 300-administered into the collecting system(s) FLUOROSCOPY TIME:  Fluoroscopy Time: 0 minutes 24 seconds (9 mGy). COMPLICATIONS: None immediate. PROCEDURE: Informed written consent was obtained from the patient after a thorough discussion of the procedural risks, benefits and alternatives. All questions were addressed. Maximal Sterile Barrier Technique was utilized including caps, mask, sterile gowns, sterile gloves, sterile drape, hand hygiene and skin antiseptic. A timeout was performed prior to the initiation of the procedure. Under sterile conditions, the existing right nephrostomy was injected with contrast. Fluoroscopic imaging performed. Nephrostomy retention loop in the renal pelvis in good position. Mild hydronephrosis noted. No evidence of contrast extravasation or communication to any vascular source. No obvious site of bleeding. IMPRESSION: Right nephrostomy in good position. No signs of active bleeding or contrast extravasation. Electronically Signed   By: Jerilynn Mages.  Shick M.D.   On: 01/02/2019 15:15   Ir Nephrostomy Placement Right  Result Date: 01/01/2019 INDICATION: 85 year old male with a history of bilateral ureteral occlusion EXAM: IR NEPHROSTOMY PLACEMENT RIGHT; ULTRAOUND INTRAOPERATIVE - NRPT [facility] COMPARISON:  None. MEDICATIONS: None ANESTHESIA/SEDATION: Fentanyl 50 mcg IV; Versed 1 mg IV Moderate Sedation Time:  20 minutes The patient was continuously monitored during the procedure by the interventional radiology nurse under my direct supervision. CONTRAST:  10 cc-administered into the collecting system(s) FLUOROSCOPY TIME:  Fluoroscopy Time: 3 minutes 0 seconds COMPLICATIONS: None  PROCEDURE: Informed written consent was obtained from the patient's family after a thorough discussion of the procedural risks, benefits and alternatives. All questions were addressed. Maximal Sterile Barrier Technique was utilized including caps, mask, sterile gowns, sterile gloves, sterile drape, hand hygiene and skin antiseptic. A timeout was performed prior to the initiation of the procedure. Patient positioned prone position on the fluoroscopy table. Ultrasound survey of the right flank was performed with images stored and sent to PACs. The patient was then prepped and draped in the usual sterile fashion. 1% lidocaine was used to anesthetize the skin and subcutaneous tissues for local anesthesia. A Chiba  needle was then used to access a posterior inferior calyx with ultrasound guidance. With spontaneous urine returned through the needle, passage of an 018 micro wire into the collecting system was performed under fluoroscopy. A small incision was made with an 11 blade scalpel, and the needle was removed from the wire. An Accustick system was then advanced over the wire into the collecting system under fluoroscopy. The metal stiffener and inner dilator were removed, and then a sample of fluid was aspirated through the 4 French outer sheath. Bentson wire was passed into the collecting system and the sheath removed. Ten French dilation of the soft tissues was performed. Using modified Seldinger technique, a 10 French pigtail catheter drain was placed over the Bentson wire. Wire and inner stiffener removed, and the pigtail was formed in the collecting system. Small amount of contrast confirmed position of the catheter. Patient tolerated the procedure well and remained hemodynamically stable throughout. No complications were encountered and no significant blood loss encountered IMPRESSION: Status post right percutaneous nephrostomy. Signed, Dulcy Fanny. Dellia Nims, RPVI [facility]  [facility] Radiology Electronically Signed   By: Corrie Mckusick D.O.   On: 01/01/2019 07:42      IMPRESSION AND PLAN:   1.  Seizure of new onset.  Patient will be admitted to a medical monitored bed.  We will place him on seizure precautions.  He will be placed on PRN IV Ativan.  A neurology consult will be obtained by Dr. Irish Elders.  And notified him regarding the consult.  Will obtain an EEG.  2.  Bilateral lower lobe pneumonia, likely aspiration.  We will continue the patient's IV cefepime and add IV Flagyl and vancomycin.  The patient has recent Pseudomonas bacteremia that is covered with IV cefepime.  We will obtain sputum Gram stain culture and sensitivity.  Will follow blood cultures.  He also has possible UTI that should be covered with mentioned antibiotics.  Urine culture and sensitivity will be obtained.  He likely has subsequent mild sepsis given his tachycardia and elevated lactic acid that will be followed.   3.  Acute kidney injury superimposed on chronic kidney disease, due to obstruction from bilateral hydroureteronephrosis with history of bladder cancer, status post bilateral nephrostomy tubes.  We will hydrate him with IV normal saline and follow BMP.  We will replace his hypokalemia and check magnesium level.  His anemia of chronic disease is slightly worse compared to 4 days ago.  We will obtain follow-up CBC.  He has atrial fibrillation with rapid ventricle response secondary to his hypokalemia as well as #1 and #2.  We will give him an IV Cardizem bolus and with borderline blood pressure will give IV digoxin.  Will follow serial troponin.  Most recent 2D echo on 11/22/2018 showed an EF of 60% with mildly dilated right atrium.  4. Essential Hypertension.  We will continue Norvasc and metoprolol.   5. DM Type II.  Will place on supplemental coverage with NovoLog and follow fingerstick blood glucose.  Will resume Januvia.  6. GERD.  Continue Protonix.   7. Hyperlipidemia.   Resume Crestor.   8. BPH.  Resume Flomax and finasteride  9.  DVT prophylaxis.  Subcutaneous heparin.  All the records are reviewed and case discussed with ED provider. The plan of care was discussed in details with the patient (and family). I answered all questions. The patient agreed to proceed with the above mentioned plan. Further management will depend upon hospital course.  CODE STATUS: Full code  TOTAL TIME TAKING CARE OF THIS PATIENT: 60 minutes.    Christel Mormon M.D on 01/03/2019 at 1:44 AM  Pager - [phone]  After 6pm go to [email or web address] - password [email or web address] [facility]  [facility]  Office  [phone]  CC: Primary care physician; Adin Hector, MD   Note: This dictation was prepared with Dragon dictation along with smaller phrase technology. Any transcriptional errors that result from this process are unintentional.

## 2019-01-03 NOTE — Progress Notes (Signed)
Pharmacy Antibiotic Note  Blake [name] is a 85 y.o. male admitted on 01/02/2019 with pneumonia, bacteremia and UTI.  Pharmacy has been consulted for Meropenem dosing. Patient admitted for new onset seizure possibly due to Cefepime.  Patient was recently discharged on IV Cefepime for Pseudomonal bacteremia and UTI.  Plan: Meropenem 500mg  IV q12h, dose adjusted for renal function. Will watch renal function closely, if declines further will need to further reduce dose.     Temp (24hrs), Avg:98.1 F (36.7 C), Min:97.5 F (36.4 C), Max:98.7 F (37.1 C)  Recent Labs  Lab 12/28/18 0337 12/29/18 0112 12/30/18 0655 12/31/18 1009 01/02/19 1523 01/02/19 2004 01/02/19 2014 01/02/19 2228 01/03/19 0444  WBC 6.7 8.4 10.0  --   --   --  9.3  --  20.4*  CREATININE 5.28* 5.19* 5.07* 4.98* 4.85*  --  4.95*  --  4.85*  LATICACIDVEN  --   --   --   --   --  3.0*  --  1.4  --     Estimated Creatinine Clearance: 10.7 mL/min (A) (by C-G formula based on SCr of 4.85 mg/dL (H)).    Allergies  Allergen Reactions  . Diovan [Valsartan] Cough  . Gabapentin Other (See Comments)    Sedation at all doses  . Hydralazine Itching  . Lisinopril Cough  . Pregabalin Other (See Comments)    Sedation at all doses  . Levofloxacin Other (See Comments)    Too many side effects. Nausea, vomiting, upset stomach, increased confusion, etc Other reaction(s): Confusion Nausea, chills Nausea, chills   . Sulfamethoxazole-Trimethoprim Other (See Comments)    Too many side effects. Nausea, vomiting, upset stomach, increased confusion, etc    Antimicrobials this admission: Cefepime 6/23 >> 6/24 Vancomycin 6/23 >> 6/24 Metronidazole 6/23 >> 6/24 Meropenem 6/24 >>  Microbiology results: 6/23 BCx: pending 6/23 UCx: pending   Thank you for allowing pharmacy to be a part of this patient's care.  Paulina Fusi, PharmD, BCPS 01/03/2019 2:15 PM

## 2019-01-03 NOTE — Consult Note (Signed)
Reason for Consult: seizures Referring Physician: Dr. Posey Pronto  CC: seizure activity   HPI: Blake [name] is an 85 y.o. male with a known history of AAA, CHF, DM, HTN, PPM, HLD who was discharged yesterday after being admitted here for Pseudomonas bacteremia with urinary obstruction status post bilateral nephrostomy and PICC line placement for which he has been getting IV cefepime.  He had a seizure episode after going home and this was new onset for him.  Upon arrival of EMS he had another seizure episode and has been postictal since then till arrival to the ER.  He denied any headache or dizziness or blurred vision, paresthesias or focal muscle weakness.  No nausea vomiting or abdominal pain.  No chest pain or dyspnea or palpitations.  No cough or wheezing or hemoptysis. Appears at baseline right now.    Past Medical History:  Diagnosis Date  . AAA (abdominal aortic aneurysm) ([place])   . Arrhythmia   . CHF (congestive heart failure) ([place])   . Diabetes mellitus without complication ([place])   . GI bleed   . Heart murmur   . Hyperlipidemia   . Hypertension   . Pacemaker   . Prostate cancer [facility])     Past Surgical History:  Procedure Laterality Date  . FLEXIBLE SIGMOIDOSCOPY N/A 10/21/2016   Procedure: FLEXIBLE SIGMOIDOSCOPY;  Surgeon: [name], MD;  Location: [facility] ENDOSCOPY;  Service: Endoscopy;  Laterality: N/A;  . IR NEPHROSTOMY PLACEMENT RIGHT  01/01/2019  . JOINT REPLACEMENT    . NEPHROSTOMY Bilateral   . PACEMAKER INSERTION Left 04/14/2015   Procedure: INSERTION PACEMAKER;  Surgeon: Isaias Cowman, MD;  Location: [facility] ORS;  Service: Cardiovascular;  Laterality: Left;  . PROSTATE SURGERY    . VISCERAL ARTERY INTERVENTION N/A 10/22/2016   Procedure: Visceral Artery Intervention;  Surgeon: Algernon Huxley, MD;  Location: [facility];  Service: Cardiovascular;  Laterality: N/A;    Family History  Problem Relation Age of Onset  . Hypertension Other   . Stroke Other   . Heart  attack Other   . Stroke Sister   . Heart attack Brother     Social History:  reports that he has quit smoking. He has never used smokeless tobacco. He reports that he does not drink alcohol or use drugs.  Allergies  Allergen Reactions  . Diovan [Valsartan] Cough  . Gabapentin Other (See Comments)    Sedation at all doses  . Hydralazine Itching  . Lisinopril Cough  . Pregabalin Other (See Comments)    Sedation at all doses  . Levofloxacin Other (See Comments)    Too many side effects. Nausea, vomiting, upset stomach, increased confusion, etc Other reaction(s): Confusion Nausea, chills Nausea, chills   . Sulfamethoxazole-Trimethoprim Other (See Comments)    Too many side effects. Nausea, vomiting, upset stomach, increased confusion, etc    Medications: I have reviewed the patient's current medications.  ROS: History obtained from the patient  General ROS: negative for - chills, fatigue, fever, night sweats, weight gain or weight loss Psychological ROS: negative for - behavioral disorder, hallucinations, memory difficulties, mood swings or suicidal ideation Ophthalmic ROS: negative for - blurry vision, double vision, eye pain or loss of vision ENT ROS: negative for - epistaxis, nasal discharge, oral lesions, sore throat, tinnitus or vertigo Allergy and Immunology ROS: negative for - hives or itchy/watery eyes Hematological and Lymphatic ROS: negative for - bleeding problems, bruising or swollen lymph nodes Endocrine ROS: negative for - galactorrhea, hair pattern changes, polydipsia/polyuria  or temperature intolerance Respiratory ROS: negative for - cough, hemoptysis, shortness of breath or wheezing Cardiovascular ROS: negative for - chest pain, dyspnea on exertion, edema or irregular heartbeat Gastrointestinal ROS: negative for - abdominal pain, diarrhea, hematemesis, nausea/vomiting or stool incontinence Genito-Urinary ROS: negative for - dysuria, hematuria, incontinence or  urinary frequency/urgency Musculoskeletal ROS: negative for - joint swelling or muscular weakness Neurological ROS: as noted in HPI Dermatological ROS: negative for rash and skin lesion changes  Physical Examination: Blood pressure 109/73, pulse 87, temperature 98.4 F (36.9 C), temperature source Oral, resp. rate 18, SpO2 95 %.    Neurological Examination   Mental Status: Alert, oriented to name location and reason he is in hospital.  Cranial Nerves: II: Discs flat bilaterally; Visual fields grossly normal, pupils equal, round, reactive to light and accommodation III,IV, VI: ptosis not present, extra-ocular motions intact bilaterally V,VII: smile symmetric, facial light touch sensation normal bilaterally VIII: hearing normal bilaterally IX,X: gag reflex present XI: bilateral shoulder shrug XII: midline tongue extension Motor: Generalized weakness with 4/4 bilaterally.  Tone and bulk:normal tone throughout; no atrophy noted Sensory: Pinprick and light touch intact throughout, bilaterally Deep Tendon Reflexes: 1+ and symmetric throughout Plantars: Right: downgoing   Left: downgoing Cerebellar: normal finger-to-nose, normal rapid alternating movements and normal heel-to-shin test Gait: not tested       Laboratory Studies:   Basic Metabolic Panel: Recent Labs  Lab 12/28/18 0337  12/30/18 0655 12/31/18 1009 01/02/19 1523 01/02/19 2014 01/03/19 0444  NA 144   < > 146* 142 135 138 139  K 3.8   < > 3.8 4.4 3.3* 3.2* 4.3  CL 106   < > 105 102 99 100 103  CO2 26   < > 26 27 25 22 22   GLUCOSE 158*   < > 149* 155* 157* 190* 203*  BUN 69*   < > 72* 76* 77* 76* 71*  CREATININE 5.28*   < > 5.07* 4.98* 4.85* 4.95* 4.85*  CALCIUM 8.4*   < > 8.8* 8.8* 8.3* 8.6* 8.4*  MG  --   --   --   --   --   --  1.7  PHOS 5.2*  --   --  5.0*  --   --   --    < > = values in this interval not displayed.    Liver Function Tests: Recent Labs  Lab 12/28/18 0337 12/31/18 1009  01/02/19 2014  AST  --   --  25  ALT  --   --  20  ALKPHOS  --   --  63  BILITOT  --   --  0.3  PROT  --   --  8.2*  ALBUMIN 2.6* 2.9* 3.1*   Recent Labs  Lab 01/02/19 2014  LIPASE 47   No results for input(s): AMMONIA in the last 168 hours.  CBC: Recent Labs  Lab 12/28/18 0337 12/29/18 0112 12/30/18 0655 01/01/19 1449 01/02/19 2014 01/03/19 0444  WBC 6.7 8.4 10.0  --  9.3 20.4*  NEUTROABS  --   --   --   --  6.7  --   HGB 8.1* 8.2* 9.0* 8.3* 7.7* 7.9*  HCT 26.5* 26.7* 29.6*  --  25.4* 25.9*  MCV 86.9 86.4 88.9  --  86.7 87.5  PLT 128* 134* 125*  --  144* 136*    Cardiac Enzymes: No results for input(s): CKTOTAL, CKMB, CKMBINDEX, TROPONINI in the last 168 hours.  BNP: Invalid input(s): POCBNP  CBG: Recent Labs  Lab 01/01/19 2127 01/02/19 0740 01/02/19 1205 01/02/19 1718 01/03/19 0845  GLUCAP 177* 151* 148* 165* 166*    Microbiology: Results for orders placed or performed during the hospital encounter of 01/02/19  Blood Culture (routine x 2)     Status: None (Preliminary result)   Collection Time: 01/02/19  8:07 PM   Specimen: BLOOD  Result Value Ref Range Status   Specimen Description BLOOD BLOOD RIGHT HAND  Final   Special Requests   Final    BOTTLES DRAWN AEROBIC AND ANAEROBIC Blood Culture results may not be optimal due to an excessive volume of blood received in culture bottles   Culture   Final    NO GROWTH < 12 HOURS Performed at [facility], [address] [place], [place] [address]    Report Status PENDING  Incomplete  Blood Culture (routine x 2)     Status: None (Preliminary result)   Collection Time: 01/02/19  8:12 PM   Specimen: BLOOD  Result Value Ref Range Status   Specimen Description BLOOD BLOOD LEFT HAND  Final   Special Requests   Final    BOTTLES DRAWN AEROBIC AND ANAEROBIC Blood Culture adequate volume   Culture   Final    NO GROWTH < 12 HOURS Performed at [facility], [address] [place],  [place] [address]    Report Status PENDING  Incomplete  SARS Coronavirus 2 (CEPHEID- Performed in [place] hospital lab), Hosp Order     Status: None   Collection Time: 01/02/19  8:52 PM   Specimen: Nasopharyngeal Swab  Result Value Ref Range Status   SARS Coronavirus 2 NEGATIVE NEGATIVE Final    Comment: (NOTE) If result is NEGATIVE SARS-CoV-2 target nucleic acids are NOT DETECTED. The SARS-CoV-2 RNA is generally detectable in upper and lower  respiratory specimens during the acute phase of infection. The lowest  concentration of SARS-CoV-2 viral copies this assay can detect is 250  copies / mL. A negative result does not preclude SARS-CoV-2 infection  and should not be used as the sole basis for treatment or other  patient management decisions.  A negative result may occur with  improper specimen collection / handling, submission of specimen other  than nasopharyngeal swab, presence of viral mutation(s) within the  areas targeted by this assay, and inadequate number of viral copies  (<250 copies / mL). A negative result must be combined with clinical  observations, patient history, and epidemiological information. If result is POSITIVE SARS-CoV-2 target nucleic acids are DETECTED. The SARS-CoV-2 RNA is generally detectable in upper and lower  respiratory specimens dur ing the acute phase of infection.  Positive  results are indicative of active infection with SARS-CoV-2.  Clinical  correlation with patient history and other diagnostic information is  necessary to determine patient infection status.  Positive results do  not rule out bacterial infection or co-infection with other viruses. If result is PRESUMPTIVE POSTIVE SARS-CoV-2 nucleic acids MAY BE PRESENT.   A presumptive positive result was obtained on the submitted specimen  and confirmed on repeat testing.  While 2019 novel coronavirus  (SARS-CoV-2) nucleic acids may be present in the submitted sample  additional confirmatory  testing may be necessary for epidemiological  and / or clinical management purposes  to differentiate between  SARS-CoV-2 and other Sarbecovirus currently known to infect humans.  If clinically indicated additional testing with an alternate test  methodology [phone]) is advised. The SARS-CoV-2 RNA is generally  detectable  in upper and lower respiratory sp ecimens during the acute  phase of infection. The expected result is Negative. Fact Sheet for Patients:  StrictlyIdeas.no Fact Sheet for Healthcare Providers: BankingDealers.co.za This test is not yet approved or cleared by the [place] FDA and has been authorized for detection and/or diagnosis of SARS-CoV-2 by FDA under an Emergency Use Authorization (EUA).  This EUA will remain in effect (meaning this test can be used) for the duration of the COVID-19 declaration under Section 564(b)(1) of the Act, 21 U.S.C. section 360bbb-3(b)(1), unless the authorization is terminated or revoked sooner. Performed at [facility], [address] [place], [place] [address]     Coagulation Studies: Recent Labs    01/02/19 10-28-2012  LABPROT 15.8*  INR 1.3*    Urinalysis:  Recent Labs  Lab 01/02/19 2052  COLORURINE YELLOW*  LABSPEC 1.014  PHURINE 5.0  GLUCOSEU 50*  HGBUR LARGE*  BILIRUBINUR NEGATIVE  KETONESUR NEGATIVE  PROTEINUR 100*  NITRITE NEGATIVE  LEUKOCYTESUR MODERATE*    Lipid Panel:  No results found for: CHOL, TRIG, HDL, CHOLHDL, VLDL, LDLCALC  HgbA1C:  Lab Results  Component Value Date   HGBA1C 6.9 (H) 01/03/2019    Urine Drug Screen:      Component Value Date/Time   LABOPIA NONE DETECTED 11/05/2018 10/28/2025   COCAINSCRNUR NONE DETECTED 11/05/2018 10-28-25   LABBENZ NONE DETECTED 11/05/2018 10-28-25   AMPHETMU NONE DETECTED 11/05/2018 2025-10-28   THCU NONE DETECTED 11/05/2018 10/28/25   LABBARB NONE DETECTED 11/05/2018 10-28-2025    Alcohol Level: No results for input(s):  ETH in the last 168 hours.   Imaging: Ct Abdomen Pelvis Wo Contrast  Result Date: 01/03/2019 CLINICAL DATA:  85 year old male with persistent cough. Recent nephrostomy placement. EXAM: CT CHEST, ABDOMEN AND PELVIS WITHOUT CONTRAST TECHNIQUE: Multidetector CT imaging of the chest, abdomen and pelvis was performed following the standard protocol without IV contrast. COMPARISON:  CT of the abdomen pelvis dated 01/01/2019 FINDINGS: Evaluation of this exam is limited in the absence of intravenous contrast. Evaluation is also limited due to streak artifact caused by patient's arms. CT CHEST FINDINGS Cardiovascular: There is no cardiomegaly or pericardial effusion. Multi vessel coronary vascular calcification. Left pectoral pacemaker device. Advanced atherosclerotic calcification of the thoracic aorta. No aneurysmal dilatation. The central pulmonary arteries are grossly unremarkable. Right-sided PICC with tip in the central SVC. Mediastinum/Nodes: No hilar or mediastinal adenopathy. There is a small hiatal hernia. The esophagus is slightly patulous and contains debris. No mediastinal fluid collection. Lungs/Pleura: Small right pleural effusion. Bilateral lower lobe predominant airspace densities, new since the prior CT most consistent with pneumonia. Aspiration is not excluded. Clinical correlation is recommended. There is a 9 mm right upper lobe nodule. No pneumothorax. Small amount of debris or mucous material noted in the left mainstem bronchus. Musculoskeletal: Osteopenia with degenerative changes of the spine. Bilateral carotid bulb calcified plaques. No acute osseous pathology. Multilevel anterior bridging osteophyte and syndesmophyte may represent DISH or related to ankylosing spondylitis. No acute osseous pathology. CT ABDOMEN PELVIS FINDINGS No intra-abdominal free air or free fluid. Hepatobiliary: No focal liver abnormality is seen. No gallstones, gallbladder wall thickening, or biliary dilatation.  Pancreas: Unremarkable. No pancreatic ductal dilatation or surrounding inflammatory changes. Spleen: Normal in size without focal abnormality. Adrenals/Urinary Tract: The adrenal glands are unremarkable. Similar position of bilateral nephrostomy tubes. There is no hydronephrosis on either side. There is high attenuating content in the right renal collecting system and pelvis extending down in the right ureter suspicious for  blood product. Correlation with urinalysis recommended. There is no hydronephrosis on either side. The urinary bladder is collapsed. Stomach/Bowel: There is extensive sigmoid diverticulosis and scattered colonic diverticula without active inflammatory changes. No bowel obstruction or active inflammation. No evidence of acute appendicitis. Vascular/Lymphatic: Advanced aortoiliac atherosclerotic disease. Infrarenal abdominal aortic aneurysm measures up to 5.5 cm in greatest diameter. This is essentially unchanged compared to the prior CT by my measurements. Bilateral common iliac artery aneurysms measure 2.6 cm on the right and 2.4 cm on the left. Evaluation of the vasculature is limited in the absence of intravenous contrast. The IVC is unremarkable. No portal venous gas. There is no adenopathy. Reproductive: Prostate brachytherapy seeds. Other: None Musculoskeletal: Osteopenia with degenerative changes of the spine. No acute osseous pathology. IMPRESSION: 1. Interval development of bilateral lower lobe predominant airspace densities most consistent with pneumonia and possible aspiration. Clinical correlation is recommended. Small right pleural effusion. 2. High attenuating content in the right renal collecting system and right ureter suspicious for blood product. Correlation with urinalysis recommended. No hydronephrosis on either side. 3. Stable position of bilateral nephrostomy tubes. 4. Extensive sigmoid and colonic diverticulosis without active inflammatory changes. No bowel obstruction. 5.  Advanced aortoiliac atherosclerotic disease with a 5.5 cm infrarenal abdominal aortic aneurysm and bilateral common iliac artery aneurysms. Recommend followup by abdomen and pelvis CTA in 3-6 months, and vascular surgery referral/consultation if not already obtained. This recommendation follows ACR consensus guidelines: White Paper of the ACR Incidental Findings Committee II on Vascular Findings. J Am Coll Radiol 2013; 10:789-794. Electronically Signed   By: Anner Crete M.D.   On: 01/03/2019 00:27   Ct Abdomen Pelvis Wo Contrast  Result Date: 01/01/2019 CLINICAL DATA:  Status post right nephrostomy EXAM: CT ABDOMEN AND PELVIS WITHOUT CONTRAST TECHNIQUE: Multidetector CT imaging of the abdomen and pelvis was performed following the standard protocol without IV contrast. COMPARISON:  CT abdomen pelvis, 12/25/2018 FINDINGS: Lower chest: Improved bibasilar pleural effusions, now minimal. Associated scarring or atelectasis. Coronary artery calcifications. Hepatobiliary: No solid liver abnormality is seen. No gallstones, gallbladder wall thickening, or biliary dilatation. Pancreas: Unremarkable. No pancreatic ductal dilatation or surrounding inflammatory changes. Spleen: Normal in size without significant abnormality. Adrenals/Urinary Tract: Adrenal glands are unremarkable. Interval placement of bilateral percutaneous nephrostomy tubes, with formed pigtails in the renal pelves and interval resolution of previously seen hydronephrosis. The urinary bladder remains decompressed by a Foley catheter. Brachytherapy pellets. Stomach/Bowel: Stomach is within normal limits. No evidence of bowel wall thickening, distention, or inflammatory changes. Generally large burden of stool in the colon with large stool ball in rectum. Pancolonic diverticulosis. 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 No enlarged abdominal or pelvic lymph nodes. Reproductive:  Brachytherapy pellets. Other: No abdominal wall hernia or abnormality. No abdominopelvic ascites. 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 IMPRESSION: 1. Interval placement of bilateral percutaneous nephrostomy tubes, with formed pigtails in the renal pelves and interval resolution of previously seen hydronephrosis. The urinary bladder remains decompressed by a Foley catheter. 2.  Improved bilateral pleural effusions, now minimal. 3. Other chronic, incidental, and postoperative findings as detailed above. Electronically Signed   By: Eddie Candle M.D.   On: 01/01/2019 14:50   Ct Head Wo Contrast  Result Date: 01/03/2019 CLINICAL DATA:  Seizure. EXAM: CT HEAD WITHOUT CONTRAST TECHNIQUE: Contiguous axial images were obtained from the base of the skull through the vertex without intravenous contrast. COMPARISON:  November 05, 2018. FINDINGS: Brain: No  evidence of acute infarction, hemorrhage, hydrocephalus, extra-axial collection or mass lesion/mass effect. Volume loss and chronic microvascular ischemic changes are noted. Vascular: No hyperdense vessel or unexpected calcification. Skull: Normal. Negative for fracture or focal lesion. Sinuses/Orbits: No acute finding. Other: None. IMPRESSION: 1. No acute intracranial abnormality. 2. Again identified are chronic microvascular ischemic changes and extensive volume loss, similar to prior study dated November 05, 2018. Electronically Signed   By: Constance [place] M.D.   On: 01/03/2019 00:32   Ct Chest Wo Contrast  Result Date: 01/03/2019 CLINICAL DATA:  85 year old male with persistent cough. Recent nephrostomy placement. EXAM: CT CHEST, ABDOMEN AND PELVIS WITHOUT CONTRAST TECHNIQUE: Multidetector CT imaging of the chest, abdomen and pelvis was performed following the standard protocol without IV contrast. COMPARISON:  CT of the abdomen pelvis dated 01/01/2019 FINDINGS: Evaluation of this exam is limited in the absence of  intravenous contrast. Evaluation is also limited due to streak artifact caused by patient's arms. CT CHEST FINDINGS Cardiovascular: There is no cardiomegaly or pericardial effusion. Multi vessel coronary vascular calcification. Left pectoral pacemaker device. Advanced atherosclerotic calcification of the thoracic aorta. No aneurysmal dilatation. The central pulmonary arteries are grossly unremarkable. Right-sided PICC with tip in the central SVC. Mediastinum/Nodes: No hilar or mediastinal adenopathy. There is a small hiatal hernia. The esophagus is slightly patulous and contains debris. No mediastinal fluid collection. Lungs/Pleura: Small right pleural effusion. Bilateral lower lobe predominant airspace densities, new since the prior CT most consistent with pneumonia. Aspiration is not excluded. Clinical correlation is recommended. There is a 9 mm right upper lobe nodule. No pneumothorax. Small amount of debris or mucous material noted in the left mainstem bronchus. Musculoskeletal: Osteopenia with degenerative changes of the spine. Bilateral carotid bulb calcified plaques. No acute osseous pathology. Multilevel anterior bridging osteophyte and syndesmophyte may represent DISH or related to ankylosing spondylitis. No acute osseous pathology. CT ABDOMEN PELVIS FINDINGS No intra-abdominal free air or free fluid. Hepatobiliary: No focal liver abnormality is seen. No gallstones, gallbladder wall thickening, or biliary dilatation. Pancreas: Unremarkable. No pancreatic ductal dilatation or surrounding inflammatory changes. Spleen: Normal in size without focal abnormality. Adrenals/Urinary Tract: The adrenal glands are unremarkable. Similar position of bilateral nephrostomy tubes. There is no hydronephrosis on either side. There is high attenuating content in the right renal collecting system and pelvis extending down in the right ureter suspicious for blood product. Correlation with urinalysis recommended. There is no  hydronephrosis on either side. The urinary bladder is collapsed. Stomach/Bowel: There is extensive sigmoid diverticulosis and scattered colonic diverticula without active inflammatory changes. No bowel obstruction or active inflammation. No evidence of acute appendicitis. Vascular/Lymphatic: Advanced aortoiliac atherosclerotic disease. Infrarenal abdominal aortic aneurysm measures up to 5.5 cm in greatest diameter. This is essentially unchanged compared to the prior CT by my measurements. Bilateral common iliac artery aneurysms measure 2.6 cm on the right and 2.4 cm on the left. Evaluation of the vasculature is limited in the absence of intravenous contrast. The IVC is unremarkable. No portal venous gas. There is no adenopathy. Reproductive: Prostate brachytherapy seeds. Other: None Musculoskeletal: Osteopenia with degenerative changes of the spine. No acute osseous pathology. IMPRESSION: 1. Interval development of bilateral lower lobe predominant airspace densities most consistent with pneumonia and possible aspiration. Clinical correlation is recommended. Small right pleural effusion. 2. High attenuating content in the right renal collecting system and right ureter suspicious for blood product. Correlation with urinalysis recommended. No hydronephrosis on either side. 3. Stable position of bilateral nephrostomy tubes. 4. Extensive sigmoid and  colonic diverticulosis without active inflammatory changes. No bowel obstruction. 5. Advanced aortoiliac atherosclerotic disease with a 5.5 cm infrarenal abdominal aortic aneurysm and bilateral common iliac artery aneurysms. Recommend followup by abdomen and pelvis CTA in 3-6 months, and vascular surgery referral/consultation if not already obtained. This recommendation follows ACR consensus guidelines: White Paper of the ACR Incidental Findings Committee II on Vascular Findings. J Am Coll Radiol 2013; 10:789-794. Electronically Signed   By: Anner Crete M.D.   On:  01/03/2019 00:27   Dg Chest Portable 1 View  Result Date: 01/02/2019 CLINICAL DATA:  85 year old male with cough. EXAM: PORTABLE CHEST 1 VIEW COMPARISON:  Chest radiograph dated 12/25/2018 FINDINGS: Right-sided PICC with tip over the mediastinum at the level of the aortic arch. There is diffuse chronic interstitial coarsening. Bibasilar linear atelectasis/scarring noted. There has been interval clearing of the previously seen bibasilar densities. No focal consolidation, pleural effusion, or pneumothorax. Stable cardiac silhouette. Left pectoral pacemaker device. Atherosclerotic calcification of the aorta. Osteopenia with degenerative changes of the spine. No acute osseous pathology. IMPRESSION: 1. No acute cardiopulmonary process. 2. Right-sided PICC with tip over the mediastinum at the level of the aortic arch. Electronically Signed   By: Anner Crete M.D.   On: 01/02/2019 20:41   Dg Nephrostogram Right Thru Existing Access  Result Date: 01/02/2019 INDICATION: Bilateral nephrostomies, right nephrostomy bloody output EXAM: RIGHT NEPHROSTOGRAM THROUGH EXISTING NEPHROSTOMY COMPARISON:  12/29/2018, 01/01/2019 MEDICATIONS: None. ANESTHESIA/SEDATION: Moderate Sedation Time:  None. The patient was continuously monitored during the procedure by the interventional radiology nurse under my direct supervision. CONTRAST:  20 cc dilute Omnipaque 300-administered into the collecting system(s) FLUOROSCOPY TIME:  Fluoroscopy Time: 0 minutes 24 seconds (9 mGy). COMPLICATIONS: None immediate. PROCEDURE: Informed written consent was obtained from the patient after a thorough discussion of the procedural risks, benefits and alternatives. All questions were addressed. Maximal Sterile Barrier Technique was utilized including caps, mask, sterile gowns, sterile gloves, sterile drape, hand hygiene and skin antiseptic. A timeout was performed prior to the initiation of the procedure. Under sterile conditions, the existing right  nephrostomy was injected with contrast. Fluoroscopic imaging performed. Nephrostomy retention loop in the renal pelvis in good position. Mild hydronephrosis noted. No evidence of contrast extravasation or communication to any vascular source. No obvious site of bleeding. IMPRESSION: Right nephrostomy in good position. No signs of active bleeding or contrast extravasation. Electronically Signed   By: Jerilynn Mages.  Shick M.D.   On: 01/02/2019 15:15     Assessment/Plan:  86 y.o. male with a known history of AAA, CHF, DM, HTN, PPM, HLD who was discharged yesterday after being admitted here for Pseudomonas bacteremia with urinary obstruction status post bilateral nephrostomy and PICC line placement for which he has been getting IV cefepime.  He had a seizure episode after going home and this was new onset for him.  Upon arrival of EMS he had another seizure episode and has been postictal since then till arrival to the ER.  He denied any headache or dizziness or blurred vision, paresthesias or focal muscle weakness.  No nausea vomiting or abdominal pain.  No chest pain or dyspnea or palpitations.  No cough or wheezing or hemoptysis. Appears at baseline right now.    - Seizures likely provoked in setting of PNA and UTI and Cefepime use - obtaining EEG now - don't think needs anti epileptics now given the fact that I believe this is provoked as above and no hx of seizures   - s/p discussion with Primus Bravo,  [place] over the phone and explained the current condition. - CTH no acute abnormalities.   01/03/2019, 12:01 PM

## 2019-01-03 NOTE — Procedures (Signed)
EEG Report  Clinical History:  New onset seizure. CXR shows bilateral pneumonia.  Technical Summary:  A 19 channel digital EEG recording was performed using the 10-20 international system of electrode placement.  Bipolar and Referential montages were used.  The total recording time was approx 20 minutes.  Findings:  There is no posterior dominant rhythm.  Background frequencies are about 6 Hz and symmetrical.  No focal slowing is present.  There are no epileptiform discharges or electrographic seizures present.  Impression:  This is an abnormal EEG.  There is evidence of moderate generalized slowing of brain activity which is non-specific but may be due to dementia, toxic, metabolic, hypoxic, or infectious etiologies.  Clinical correlation is recommended.  Rogue Jury, MS, MD

## 2019-01-03 NOTE — Progress Notes (Signed)
eeg completed ° °

## 2019-01-03 NOTE — Progress Notes (Signed)
Date of Admission:  01/02/2019  Blake [name] is a 85 y.o. male with a history of prostate cancer, bladder cancer and resected bladder tumors with Bilateral hydro-ureteronephrosis since January 2018 likely due to either chronic bladder outlet obstruction versus bilateral ureteral scarring from radiation for prostate cancer, recurrent UTI, CKD, cardiac pacemaker   Pt was discharged from hospital on 6/23/ and when he reached home even before he got out of the car he had seizures and EMS was called and he was brought back to the ED.EMS witnessed a seizure and in the ED he had another seizure . He also had vomited CT scan head with no acute changes, CT chest noted to have b/l basal infiltrates concerning for aspiration -   Pt was in [facility] since 12/25/18 for pseudomonas bacteremia and b/l hydroureteronephrosis secondary to obstructive uropathy from bladder carcinoma . He was on cefepime and also had b/l PCN. The rt nephrostomy drainage was hemorrhagic- Foley was removed on 01/01/19. He was discharged with PICC to get cefepime until 01/19/19   Patient has got a complicated urological history. He has high-grade bladder cancer which is untreated tumor with recurrent urinary tract infection and is colonized with Pseudomonas and enterococcus and has been on multiple antibiotics in the past   Patient was hospitalized between 5 Nov 20, 2020 Dec 07, 2018 for acute on chronic renal failure stage IV with chronic bilateral hydronephrosis and sepsis with Pseudomonas and he was started put on IV fluids and IV antibiotic he was sent home on IV ceftaz edema to complete 12/03/2018  Past medical history Recurrent UTi Klebsiella bacteremia B/l hydroureteronephrosis Bladder ca not treated Prostate ca AAA Atrial fibrillation Cardiac pacemaker Degenerative arthritis Diabetes mellitus type 2 Hematuria Hyperlipidemia Hypertension Heart murmur  Past surgical history B/l nephrostomy Appendectomy Pacemaker  insertion Total knee replacement left side 2008 Bladder surgery - polypectomy Prostate surgery - resection of prostate cancer  Social history Former smoker No alcohol or drug use. Lives with his daughter who is a Pharmacist, community  Subjective: Says he is okay  Medications:  . amLODipine  2.5 mg Oral Daily  . aspirin EC  81 mg Oral Daily  . feeding supplement (GLUCERNA SHAKE)  237 mL Oral BID BM  . ferrous sulfate  325 mg Oral Daily  . finasteride  5 mg Oral Daily  . heparin injection (subcutaneous)  5,000 Units Subcutaneous Q8H  . insulin aspart  0-9 Units Subcutaneous TID WC  . linagliptin  5 mg Oral Daily  . metoprolol tartrate  37.5 mg Oral BID  . multivitamin with minerals  1 tablet Oral Daily  . multivitamin-lutein   Oral BID  . pantoprazole  40 mg Oral Daily  . rosuvastatin  5 mg Oral QPM  . sodium bicarbonate  650 mg Oral BID  . tamsulosin  0.4 mg Oral Daily  . vancomycin variable dose per unstable renal function (pharmacist dosing)   Does not apply See admin instructions    Objective: Vital signs in last 24 hours: Temp:  [97.5 F (36.4 C)-98.4 F (36.9 C)] 98.4 F (36.9 C) (06/24 0835) Pulse Rate:  [30-126] 87 (06/24 0835) Resp:  [17-57] 18 (06/24 0835) BP: (105-161)/(53-130) 109/73 (06/24 0835) SpO2:  [79 %-100 %] 95 % (06/24 0840)  PHYSICAL EXAM:  General: Alert, cooperative, no distress, . Oriented in person and place Head: Normocephalic, without obvious abnormality, atraumatic. Eyes: Conjunctivae clear, anicteric sclerae. Pupils are equal ENT Nares normal. No drainage or sinus tenderness. Lips, mucosa, and tongue normal.  No Thrush Neck: Supple, symmetrical, no adenopathy, thyroid: non tender no carotid bruit and no JVD. Back: rt nephrostomy- dark blood- Left nephrostomy- clear urine Lungs: b/l air entry Heart: s1s2- pacemaker site okay Abdomen: Soft,  Extremities: rt PICC Skin: No rashes or lesions. Or bruising Lymph: Cervical, supraclavicular  normal. Neurologic: Grossly non-focal  Lab Results Recent Labs    01/02/19 2014 01/03/19 0444  WBC 9.3 20.4*  HGB 7.7* 7.9*  HCT 25.4* 25.9*  NA 138 139  K 3.2* 4.3  CL 100 103  CO2 22 22  BUN 76* 71*  CREATININE 4.95* 4.85*   Liver Panel Recent Labs    01/02/19 2014  PROT 8.2*  ALBUMIN 3.1*  AST 25  ALT 20  ALKPHOS 63  BILITOT 0.3   Sedimentation Rate No results for input(s): ESRSEDRATE in the last 72 hours. C-Reactive Protein No results for input(s): CRP in the last 72 hours.  Microbiology:  Studies/Results: Ct Abdomen Pelvis Wo Contrast  Result Date: 01/03/2019 CLINICAL DATA:  85 year old male with persistent cough. Recent nephrostomy placement. EXAM: CT CHEST, ABDOMEN AND PELVIS WITHOUT CONTRAST TECHNIQUE: Multidetector CT imaging of the chest, abdomen and pelvis was performed following the standard protocol without IV contrast. COMPARISON:  CT of the abdomen pelvis dated 01/01/2019 FINDINGS: Evaluation of this exam is limited in the absence of intravenous contrast. Evaluation is also limited due to streak artifact caused by patient's arms. CT CHEST FINDINGS Cardiovascular: There is no cardiomegaly or pericardial effusion. Multi vessel coronary vascular calcification. Left pectoral pacemaker device. Advanced atherosclerotic calcification of the thoracic aorta. No aneurysmal dilatation. The central pulmonary arteries are grossly unremarkable. Right-sided PICC with tip in the central SVC. Mediastinum/Nodes: No hilar or mediastinal adenopathy. There is a small hiatal hernia. The esophagus is slightly patulous and contains debris. No mediastinal fluid collection. Lungs/Pleura: Small right pleural effusion. Bilateral lower lobe predominant airspace densities, new since the prior CT most consistent with pneumonia. Aspiration is not excluded. Clinical correlation is recommended. There is a 9 mm right upper lobe nodule. No pneumothorax. Small amount of debris or mucous material  noted in the left mainstem bronchus. Musculoskeletal: Osteopenia with degenerative changes of the spine. Bilateral carotid bulb calcified plaques. No acute osseous pathology. Multilevel anterior bridging osteophyte and syndesmophyte may represent DISH or related to ankylosing spondylitis. No acute osseous pathology. CT ABDOMEN PELVIS FINDINGS No intra-abdominal free air or free fluid. Hepatobiliary: No focal liver abnormality is seen. No gallstones, gallbladder wall thickening, or biliary dilatation. Pancreas: Unremarkable. No pancreatic ductal dilatation or surrounding inflammatory changes. Spleen: Normal in size without focal abnormality. Adrenals/Urinary Tract: The adrenal glands are unremarkable. Similar position of bilateral nephrostomy tubes. There is no hydronephrosis on either side. There is high attenuating content in the right renal collecting system and pelvis extending down in the right ureter suspicious for blood product. Correlation with urinalysis recommended. There is no hydronephrosis on either side. The urinary bladder is collapsed. Stomach/Bowel: There is extensive sigmoid diverticulosis and scattered colonic diverticula without active inflammatory changes. No bowel obstruction or active inflammation. No evidence of acute appendicitis. Vascular/Lymphatic: Advanced aortoiliac atherosclerotic disease. Infrarenal abdominal aortic aneurysm measures up to 5.5 cm in greatest diameter. This is essentially unchanged compared to the prior CT by my measurements. Bilateral common iliac artery aneurysms measure 2.6 cm on the right and 2.4 cm on the left. Evaluation of the vasculature is limited in the absence of intravenous contrast. The IVC is unremarkable. No portal venous gas. There is no adenopathy. Reproductive: Prostate brachytherapy  seeds. Other: None Musculoskeletal: Osteopenia with degenerative changes of the spine. No acute osseous pathology. IMPRESSION: 1. Interval development of bilateral lower  lobe predominant airspace densities most consistent with pneumonia and possible aspiration. Clinical correlation is recommended. Small right pleural effusion. 2. High attenuating content in the right renal collecting system and right ureter suspicious for blood product. Correlation with urinalysis recommended. No hydronephrosis on either side. 3. Stable position of bilateral nephrostomy tubes. 4. Extensive sigmoid and colonic diverticulosis without active inflammatory changes. No bowel obstruction. 5. Advanced aortoiliac atherosclerotic disease with a 5.5 cm infrarenal abdominal aortic aneurysm and bilateral common iliac artery aneurysms. Recommend followup by abdomen and pelvis CTA in 3-6 months, and vascular surgery referral/consultation if not already obtained. This recommendation follows ACR consensus guidelines: White Paper of the ACR Incidental Findings Committee II on Vascular Findings. J Am Coll Radiol 2013; 10:789-794. Electronically Signed   By: Anner Crete M.D.   On: 01/03/2019 00:27   Ct Abdomen Pelvis Wo Contrast  Result Date: 01/01/2019 CLINICAL DATA:  Status post right nephrostomy EXAM: CT ABDOMEN AND PELVIS WITHOUT CONTRAST TECHNIQUE: Multidetector CT imaging of the abdomen and pelvis was performed following the standard protocol without IV contrast. COMPARISON:  CT abdomen pelvis, 12/25/2018 FINDINGS: Lower chest: Improved bibasilar pleural effusions, now minimal. Associated scarring or atelectasis. Coronary artery calcifications. Hepatobiliary: No solid liver abnormality is seen. No gallstones, gallbladder wall thickening, or biliary dilatation. Pancreas: Unremarkable. No pancreatic ductal dilatation or surrounding inflammatory changes. Spleen: Normal in size without significant abnormality. Adrenals/Urinary Tract: Adrenal glands are unremarkable. Interval placement of bilateral percutaneous nephrostomy tubes, with formed pigtails in the renal pelves and interval resolution of previously  seen hydronephrosis. The urinary bladder remains decompressed by a Foley catheter. Brachytherapy pellets. Stomach/Bowel: Stomach is within normal limits. No evidence of bowel wall thickening, distention, or inflammatory changes. Generally large burden of stool in the colon with large stool ball in rectum. Pancolonic diverticulosis. 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 No enlarged abdominal or pelvic lymph nodes. Reproductive: Brachytherapy pellets. Other: No abdominal wall hernia or abnormality. No abdominopelvic ascites. 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 IMPRESSION: 1. Interval placement of bilateral percutaneous nephrostomy tubes, with formed pigtails in the renal pelves and interval resolution of previously seen hydronephrosis. The urinary bladder remains decompressed by a Foley catheter. 2.  Improved bilateral pleural effusions, now minimal. 3. Other chronic, incidental, and postoperative findings as detailed above. Electronically Signed   By: Eddie Candle M.D.   On: 01/01/2019 14:50   Ct Head Wo Contrast  Result Date: 01/03/2019 CLINICAL DATA:  Seizure. EXAM: CT HEAD WITHOUT CONTRAST TECHNIQUE: Contiguous axial images were obtained from the base of the skull through the vertex without intravenous contrast. COMPARISON:  November 05, 2018. FINDINGS: Brain: No evidence of acute infarction, hemorrhage, hydrocephalus, extra-axial collection or mass lesion/mass effect. Volume loss and chronic microvascular ischemic changes are noted. Vascular: No hyperdense vessel or unexpected calcification. Skull: Normal. Negative for fracture or focal lesion. Sinuses/Orbits: No acute finding. Other: None. IMPRESSION: 1. No acute intracranial abnormality. 2. Again identified are chronic microvascular ischemic changes and extensive volume loss, similar to prior study dated November 05, 2018. Electronically Signed   By:  Constance Holster M.D.   On: 01/03/2019 00:32   Ct Chest Wo Contrast  Result Date: 01/03/2019 CLINICAL DATA:  85 year old male with persistent cough. Recent nephrostomy placement. EXAM: CT CHEST, ABDOMEN AND PELVIS WITHOUT CONTRAST TECHNIQUE: Multidetector CT imaging  of the chest, abdomen and pelvis was performed following the standard protocol without IV contrast. COMPARISON:  CT of the abdomen pelvis dated 01/01/2019 FINDINGS: Evaluation of this exam is limited in the absence of intravenous contrast. Evaluation is also limited due to streak artifact caused by patient's arms. CT CHEST FINDINGS Cardiovascular: There is no cardiomegaly or pericardial effusion. Multi vessel coronary vascular calcification. Left pectoral pacemaker device. Advanced atherosclerotic calcification of the thoracic aorta. No aneurysmal dilatation. The central pulmonary arteries are grossly unremarkable. Right-sided PICC with tip in the central SVC. Mediastinum/Nodes: No hilar or mediastinal adenopathy. There is a small hiatal hernia. The esophagus is slightly patulous and contains debris. No mediastinal fluid collection. Lungs/Pleura: Small right pleural effusion. Bilateral lower lobe predominant airspace densities, new since the prior CT most consistent with pneumonia. Aspiration is not excluded. Clinical correlation is recommended. There is a 9 mm right upper lobe nodule. No pneumothorax. Small amount of debris or mucous material noted in the left mainstem bronchus. Musculoskeletal: Osteopenia with degenerative changes of the spine. Bilateral carotid bulb calcified plaques. No acute osseous pathology. Multilevel anterior bridging osteophyte and syndesmophyte may represent DISH or related to ankylosing spondylitis. No acute osseous pathology. CT ABDOMEN PELVIS FINDINGS No intra-abdominal free air or free fluid. Hepatobiliary: No focal liver abnormality is seen. No gallstones, gallbladder wall thickening, or biliary dilatation.  Pancreas: Unremarkable. No pancreatic ductal dilatation or surrounding inflammatory changes. Spleen: Normal in size without focal abnormality. Adrenals/Urinary Tract: The adrenal glands are unremarkable. Similar position of bilateral nephrostomy tubes. There is no hydronephrosis on either side. There is high attenuating content in the right renal collecting system and pelvis extending down in the right ureter suspicious for blood product. Correlation with urinalysis recommended. There is no hydronephrosis on either side. The urinary bladder is collapsed. Stomach/Bowel: There is extensive sigmoid diverticulosis and scattered colonic diverticula without active inflammatory changes. No bowel obstruction or active inflammation. No evidence of acute appendicitis. Vascular/Lymphatic: Advanced aortoiliac atherosclerotic disease. Infrarenal abdominal aortic aneurysm measures up to 5.5 cm in greatest diameter. This is essentially unchanged compared to the prior CT by my measurements. Bilateral common iliac artery aneurysms measure 2.6 cm on the right and 2.4 cm on the left. Evaluation of the vasculature is limited in the absence of intravenous contrast. The IVC is unremarkable. No portal venous gas. There is no adenopathy. Reproductive: Prostate brachytherapy seeds. Other: None Musculoskeletal: Osteopenia with degenerative changes of the spine. No acute osseous pathology. IMPRESSION: 1. Interval development of bilateral lower lobe predominant airspace densities most consistent with pneumonia and possible aspiration. Clinical correlation is recommended. Small right pleural effusion. 2. High attenuating content in the right renal collecting system and right ureter suspicious for blood product. Correlation with urinalysis recommended. No hydronephrosis on either side. 3. Stable position of bilateral nephrostomy tubes. 4. Extensive sigmoid and colonic diverticulosis without active inflammatory changes. No bowel obstruction. 5.  Advanced aortoiliac atherosclerotic disease with a 5.5 cm infrarenal abdominal aortic aneurysm and bilateral common iliac artery aneurysms. Recommend followup by abdomen and pelvis CTA in 3-6 months, and vascular surgery referral/consultation if not already obtained. This recommendation follows ACR consensus guidelines: White Paper of the ACR Incidental Findings Committee II on Vascular Findings. J Am Coll Radiol 2013; 10:789-794. Electronically Signed   By: Anner Crete M.D.   On: 01/03/2019 00:27   Dg Chest Portable 1 View  Result Date: 01/02/2019 CLINICAL DATA:  85 year old male with cough. EXAM: PORTABLE CHEST 1 VIEW COMPARISON:  Chest radiograph dated 12/25/2018 FINDINGS: Right-sided  PICC with tip over the mediastinum at the level of the aortic arch. There is diffuse chronic interstitial coarsening. Bibasilar linear atelectasis/scarring noted. There has been interval clearing of the previously seen bibasilar densities. No focal consolidation, pleural effusion, or pneumothorax. Stable cardiac silhouette. Left pectoral pacemaker device. Atherosclerotic calcification of the aorta. Osteopenia with degenerative changes of the spine. No acute osseous pathology. IMPRESSION: 1. No acute cardiopulmonary process. 2. Right-sided PICC with tip over the mediastinum at the level of the aortic arch. Electronically Signed   By: Anner Crete M.D.   On: 01/02/2019 20:41   Dg Nephrostogram Right Thru Existing Access  Result Date: 01/02/2019 INDICATION: Bilateral nephrostomies, right nephrostomy bloody output EXAM: RIGHT NEPHROSTOGRAM THROUGH EXISTING NEPHROSTOMY COMPARISON:  12/29/2018, 01/01/2019 MEDICATIONS: None. ANESTHESIA/SEDATION: Moderate Sedation Time:  None. The patient was continuously monitored during the procedure by the interventional radiology nurse under my direct supervision. CONTRAST:  20 cc dilute Omnipaque 300-administered into the collecting system(s) FLUOROSCOPY TIME:  Fluoroscopy Time: 0  minutes 24 seconds (9 mGy). COMPLICATIONS: None immediate. PROCEDURE: Informed written consent was obtained from the patient after a thorough discussion of the procedural risks, benefits and alternatives. All questions were addressed. Maximal Sterile Barrier Technique was utilized including caps, mask, sterile gowns, sterile gloves, sterile drape, hand hygiene and skin antiseptic. A timeout was performed prior to the initiation of the procedure. Under sterile conditions, the existing right nephrostomy was injected with contrast. Fluoroscopic imaging performed. Nephrostomy retention loop in the renal pelvis in good position. Mild hydronephrosis noted. No evidence of contrast extravasation or communication to any vascular source. No obvious site of bleeding. IMPRESSION: Right nephrostomy in good position. No signs of active bleeding or contrast extravasation. Electronically Signed   By: Jerilynn Mages.  Shick M.D.   On: 01/02/2019 15:15     Assessment/Plan:  Seizures-new onset- as pt was on cefepime and in the setting of AKI/CKD concern for neurotoxicity- DC Cefepime  Possible aspiration pneumonia- meropenem is adequate- will dc fflagyl. Also on vanco - may DC if blood culture neg  Pseudomonas bacteremia secondary to pseudomonascomplicatedUTI- on cefepime DC cefepime because of seizur and start meropenem  B/l hydroureteronephrosis secondary to out flow obstruction/scarring/fibroiss and bladder ca- has b/l nephrostomy and rt one has dark blood  Foley was removed 6/22- PVR by bladder scan on 6/23 was minimal urine  AKI on CKD - secondary to the above  Bladder ca  CHF On frusemide  Pacemaker ? H/ prostate cancer- had surgery and radiation in the past

## 2019-01-03 NOTE — Progress Notes (Deleted)
[facility] [place] NAME: Blake [name]    MR#:  [id number]  DATE OF BIRTH:  01-03-1923  SUBJECTIVE:    REVIEW OF SYSTEMS:   ROS Tolerating Diet: Tolerating PT:   DRUG ALLERGIES:   Allergies  Allergen Reactions  . Diovan [Valsartan] Cough  . Gabapentin Other (See Comments)    Sedation at all doses  . Hydralazine Itching  . Lisinopril Cough  . Pregabalin Other (See Comments)    Sedation at all doses  . Levofloxacin Other (See Comments)    Too many side effects. Nausea, vomiting, upset stomach, increased confusion, etc Other reaction(s): Confusion Nausea, chills Nausea, chills   . Sulfamethoxazole-Trimethoprim Other (See Comments)    Too many side effects. Nausea, vomiting, upset stomach, increased confusion, etc    VITALS:  Blood pressure 130/69, pulse 90, temperature 98.7 F (37.1 C), temperature source Oral, resp. rate 18, SpO2 98 %.  PHYSICAL EXAMINATION:   Physical Exam  GENERAL:  85 y.o.-year-old patient lying in the bed with no acute distress.  EYES: Pupils equal, round, reactive to light and accommodation. No scleral icterus. Extraocular muscles intact.  HEENT: Head atraumatic, normocephalic. Oropharynx and nasopharynx clear.  NECK:  Supple, no jugular venous distention. No thyroid enlargement, no tenderness.  LUNGS: Normal breath sounds bilaterally, no wheezing, rales, rhonchi. No use of accessory muscles of respiration.  CARDIOVASCULAR: S1, S2 normal. No murmurs, rubs, or gallops.  ABDOMEN: Soft, nontender, nondistended. Bowel sounds present. No organomegaly or mass.  EXTREMITIES: No cyanosis, clubbing or edema b/l.    NEUROLOGIC: Cranial nerves II through XII are intact. No focal Motor or sensory deficits b/l.   PSYCHIATRIC:  patient is alert and oriented x 3.  SKIN: No obvious rash, lesion, or ulcer.   LABORATORY PANEL:  CBC Recent Labs  Lab 01/03/19 0444  WBC 20.4*  HGB 7.9*  HCT 25.9*  PLT  136*    Chemistries  Recent Labs  Lab 01/02/19 2014 01/03/19 0444  NA 138 139  K 3.2* 4.3  CL 100 103  CO2 22 22  GLUCOSE 190* 203*  BUN 76* 71*  CREATININE 4.95* 4.85*  CALCIUM 8.6* 8.4*  MG  --  1.7  AST 25  --   ALT 20  --   ALKPHOS 63  --   BILITOT 0.3  --    Cardiac Enzymes No results for input(s): TROPONINI in the last 168 hours. RADIOLOGY:  Ct Abdomen Pelvis Wo Contrast  Result Date: 01/03/2019 CLINICAL DATA:  85 year old male with persistent cough. Recent nephrostomy placement. EXAM: CT CHEST, ABDOMEN AND PELVIS WITHOUT CONTRAST TECHNIQUE: Multidetector CT imaging of the chest, abdomen and pelvis was performed following the standard protocol without IV contrast. COMPARISON:  CT of the abdomen pelvis dated 01/01/2019 FINDINGS: Evaluation of this exam is limited in the absence of intravenous contrast. Evaluation is also limited due to streak artifact caused by patient's arms. CT CHEST FINDINGS Cardiovascular: There is no cardiomegaly or pericardial effusion. Multi vessel coronary vascular calcification. Left pectoral pacemaker device. Advanced atherosclerotic calcification of the thoracic aorta. No aneurysmal dilatation. The central pulmonary arteries are grossly unremarkable. Right-sided PICC with tip in the central SVC. Mediastinum/Nodes: No hilar or mediastinal adenopathy. There is a small hiatal hernia. The esophagus is slightly patulous and contains debris. No mediastinal fluid collection. Lungs/Pleura: Small right pleural effusion. Bilateral lower lobe predominant airspace densities, new since the prior CT most consistent with pneumonia. Aspiration is not excluded. Clinical correlation is recommended.  There is a 9 mm right upper lobe nodule. No pneumothorax. Small amount of debris or mucous material noted in the left mainstem bronchus. Musculoskeletal: Osteopenia with degenerative changes of the spine. Bilateral carotid bulb calcified plaques. No acute osseous pathology.  Multilevel anterior bridging osteophyte and syndesmophyte may represent DISH or related to ankylosing spondylitis. No acute osseous pathology. CT ABDOMEN PELVIS FINDINGS No intra-abdominal free air or free fluid. Hepatobiliary: No focal liver abnormality is seen. No gallstones, gallbladder wall thickening, or biliary dilatation. Pancreas: Unremarkable. No pancreatic ductal dilatation or surrounding inflammatory changes. Spleen: Normal in size without focal abnormality. Adrenals/Urinary Tract: The adrenal glands are unremarkable. Similar position of bilateral nephrostomy tubes. There is no hydronephrosis on either side. There is high attenuating content in the right renal collecting system and pelvis extending down in the right ureter suspicious for blood product. Correlation with urinalysis recommended. There is no hydronephrosis on either side. The urinary bladder is collapsed. Stomach/Bowel: There is extensive sigmoid diverticulosis and scattered colonic diverticula without active inflammatory changes. No bowel obstruction or active inflammation. No evidence of acute appendicitis. Vascular/Lymphatic: Advanced aortoiliac atherosclerotic disease. Infrarenal abdominal aortic aneurysm measures up to 5.5 cm in greatest diameter. This is essentially unchanged compared to the prior CT by my measurements. Bilateral common iliac artery aneurysms measure 2.6 cm on the right and 2.4 cm on the left. Evaluation of the vasculature is limited in the absence of intravenous contrast. The IVC is unremarkable. No portal venous gas. There is no adenopathy. Reproductive: Prostate brachytherapy seeds. Other: None Musculoskeletal: Osteopenia with degenerative changes of the spine. No acute osseous pathology. IMPRESSION: 1. Interval development of bilateral lower lobe predominant airspace densities most consistent with pneumonia and possible aspiration. Clinical correlation is recommended. Small right pleural effusion. 2. High  attenuating content in the right renal collecting system and right ureter suspicious for blood product. Correlation with urinalysis recommended. No hydronephrosis on either side. 3. Stable position of bilateral nephrostomy tubes. 4. Extensive sigmoid and colonic diverticulosis without active inflammatory changes. No bowel obstruction. 5. Advanced aortoiliac atherosclerotic disease with a 5.5 cm infrarenal abdominal aortic aneurysm and bilateral common iliac artery aneurysms. Recommend followup by abdomen and pelvis CTA in 3-6 months, and vascular surgery referral/consultation if not already obtained. This recommendation follows ACR consensus guidelines: White Paper of the ACR Incidental Findings Committee II on Vascular Findings. J Am Coll Radiol 2013; 10:789-794. Electronically Signed   By: Anner Crete M.D.   On: 01/03/2019 00:27   Ct Abdomen Pelvis Wo Contrast  Result Date: 01/01/2019 CLINICAL DATA:  Status post right nephrostomy EXAM: CT ABDOMEN AND PELVIS WITHOUT CONTRAST TECHNIQUE: Multidetector CT imaging of the abdomen and pelvis was performed following the standard protocol without IV contrast. COMPARISON:  CT abdomen pelvis, 12/25/2018 FINDINGS: Lower chest: Improved bibasilar pleural effusions, now minimal. Associated scarring or atelectasis. Coronary artery calcifications. Hepatobiliary: No solid liver abnormality is seen. No gallstones, gallbladder wall thickening, or biliary dilatation. Pancreas: Unremarkable. No pancreatic ductal dilatation or surrounding inflammatory changes. Spleen: Normal in size without significant abnormality. Adrenals/Urinary Tract: Adrenal glands are unremarkable. Interval placement of bilateral percutaneous nephrostomy tubes, with formed pigtails in the renal pelves and interval resolution of previously seen hydronephrosis. The urinary bladder remains decompressed by a Foley catheter. Brachytherapy pellets. Stomach/Bowel: Stomach is within normal limits. No evidence of  bowel wall thickening, distention, or inflammatory changes. Generally large burden of stool in the colon with large stool ball in rectum. Pancolonic diverticulosis. Vascular/Lymphatic: Aortic atherosclerosis with a 5.2 cm saccular aneurysm  of the infrarenal abdominal aorta. No enlarged abdominal or pelvic lymph nodes. Reproductive: Brachytherapy pellets. Other: No abdominal wall hernia or abnormality. No abdominopelvic ascites. 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 IMPRESSION: 1. Interval placement of bilateral percutaneous nephrostomy tubes, with formed pigtails in the renal pelves and interval resolution of previously seen hydronephrosis. The urinary bladder remains decompressed by a Foley catheter. 2.  Improved bilateral pleural effusions, now minimal. 3. Other chronic, incidental, and postoperative findings as detailed above. Electronically Signed   By: Eddie Candle M.D.   On: 01/01/2019 14:50   Ct Head Wo Contrast  Result Date: 01/03/2019 CLINICAL DATA:  Seizure. EXAM: CT HEAD WITHOUT CONTRAST TECHNIQUE: Contiguous axial images were obtained from the base of the skull through the vertex without intravenous contrast. COMPARISON:  November 05, 2018. FINDINGS: Brain: No evidence of acute infarction, hemorrhage, hydrocephalus, extra-axial collection or mass lesion/mass effect. Volume loss and chronic microvascular ischemic changes are noted. Vascular: No hyperdense vessel or unexpected calcification. Skull: Normal. Negative for fracture or focal lesion. Sinuses/Orbits: No acute finding. Other: None. IMPRESSION: 1. No acute intracranial abnormality. 2. Again identified are chronic microvascular ischemic changes and extensive volume loss, similar to prior study dated November 05, 2018. Electronically Signed   By: Constance Holster M.D.   On: 01/03/2019 00:32   Ct Chest Wo Contrast  Result Date: 01/03/2019 CLINICAL DATA:  85 year old male with persistent cough. Recent  nephrostomy placement. EXAM: CT CHEST, ABDOMEN AND PELVIS WITHOUT CONTRAST TECHNIQUE: Multidetector CT imaging of the chest, abdomen and pelvis was performed following the standard protocol without IV contrast. COMPARISON:  CT of the abdomen pelvis dated 01/01/2019 FINDINGS: Evaluation of this exam is limited in the absence of intravenous contrast. Evaluation is also limited due to streak artifact caused by patient's arms. CT CHEST FINDINGS Cardiovascular: There is no cardiomegaly or pericardial effusion. Multi vessel coronary vascular calcification. Left pectoral pacemaker device. Advanced atherosclerotic calcification of the thoracic aorta. No aneurysmal dilatation. The central pulmonary arteries are grossly unremarkable. Right-sided PICC with tip in the central SVC. Mediastinum/Nodes: No hilar or mediastinal adenopathy. There is a small hiatal hernia. The esophagus is slightly patulous and contains debris. No mediastinal fluid collection. Lungs/Pleura: Small right pleural effusion. Bilateral lower lobe predominant airspace densities, new since the prior CT most consistent with pneumonia. Aspiration is not excluded. Clinical correlation is recommended. There is a 9 mm right upper lobe nodule. No pneumothorax. Small amount of debris or mucous material noted in the left mainstem bronchus. Musculoskeletal: Osteopenia with degenerative changes of the spine. Bilateral carotid bulb calcified plaques. No acute osseous pathology. Multilevel anterior bridging osteophyte and syndesmophyte may represent DISH or related to ankylosing spondylitis. No acute osseous pathology. CT ABDOMEN PELVIS FINDINGS No intra-abdominal free air or free fluid. Hepatobiliary: No focal liver abnormality is seen. No gallstones, gallbladder wall thickening, or biliary dilatation. Pancreas: Unremarkable. No pancreatic ductal dilatation or surrounding inflammatory changes. Spleen: Normal in size without focal abnormality. Adrenals/Urinary Tract: The  adrenal glands are unremarkable. Similar position of bilateral nephrostomy tubes. There is no hydronephrosis on either side. There is high attenuating content in the right renal collecting system and pelvis extending down in the right ureter suspicious for blood product. Correlation with urinalysis recommended. There is no hydronephrosis on either side. The urinary bladder is collapsed. Stomach/Bowel: There is extensive sigmoid diverticulosis and scattered colonic diverticula without active inflammatory changes. No bowel obstruction or active inflammation. No evidence of acute appendicitis. Vascular/Lymphatic: Advanced aortoiliac atherosclerotic disease. Infrarenal abdominal  aortic aneurysm measures up to 5.5 cm in greatest diameter. This is essentially unchanged compared to the prior CT by my measurements. Bilateral common iliac artery aneurysms measure 2.6 cm on the right and 2.4 cm on the left. Evaluation of the vasculature is limited in the absence of intravenous contrast. The IVC is unremarkable. No portal venous gas. There is no adenopathy. Reproductive: Prostate brachytherapy seeds. Other: None Musculoskeletal: Osteopenia with degenerative changes of the spine. No acute osseous pathology. IMPRESSION: 1. Interval development of bilateral lower lobe predominant airspace densities most consistent with pneumonia and possible aspiration. Clinical correlation is recommended. Small right pleural effusion. 2. High attenuating content in the right renal collecting system and right ureter suspicious for blood product. Correlation with urinalysis recommended. No hydronephrosis on either side. 3. Stable position of bilateral nephrostomy tubes. 4. Extensive sigmoid and colonic diverticulosis without active inflammatory changes. No bowel obstruction. 5. Advanced aortoiliac atherosclerotic disease with a 5.5 cm infrarenal abdominal aortic aneurysm and bilateral common iliac artery aneurysms. Recommend followup by abdomen  and pelvis CTA in 3-6 months, and vascular surgery referral/consultation if not already obtained. This recommendation follows ACR consensus guidelines: White Paper of the ACR Incidental Findings Committee II on Vascular Findings. J Am Coll Radiol 2013; 10:789-794. Electronically Signed   By: Anner Crete M.D.   On: 01/03/2019 00:27   Dg Chest Portable 1 View  Result Date: 01/02/2019 CLINICAL DATA:  85 year old male with cough. EXAM: PORTABLE CHEST 1 VIEW COMPARISON:  Chest radiograph dated 12/25/2018 FINDINGS: Right-sided PICC with tip over the mediastinum at the level of the aortic arch. There is diffuse chronic interstitial coarsening. Bibasilar linear atelectasis/scarring noted. There has been interval clearing of the previously seen bibasilar densities. No focal consolidation, pleural effusion, or pneumothorax. Stable cardiac silhouette. Left pectoral pacemaker device. Atherosclerotic calcification of the aorta. Osteopenia with degenerative changes of the spine. No acute osseous pathology. IMPRESSION: 1. No acute cardiopulmonary process. 2. Right-sided PICC with tip over the mediastinum at the level of the aortic arch. Electronically Signed   By: Anner Crete M.D.   On: 01/02/2019 20:41   Dg Nephrostogram Right Thru Existing Access  Result Date: 01/02/2019 INDICATION: Bilateral nephrostomies, right nephrostomy bloody output EXAM: RIGHT NEPHROSTOGRAM THROUGH EXISTING NEPHROSTOMY COMPARISON:  12/29/2018, 01/01/2019 MEDICATIONS: None. ANESTHESIA/SEDATION: Moderate Sedation Time:  None. The patient was continuously monitored during the procedure by the interventional radiology nurse under my direct supervision. CONTRAST:  20 cc dilute Omnipaque 300-administered into the collecting system(s) FLUOROSCOPY TIME:  Fluoroscopy Time: 0 minutes 24 seconds (9 mGy). COMPLICATIONS: None immediate. PROCEDURE: Informed written consent was obtained from the patient after a thorough discussion of the procedural  risks, benefits and alternatives. All questions were addressed. Maximal Sterile Barrier Technique was utilized including caps, mask, sterile gowns, sterile gloves, sterile drape, hand hygiene and skin antiseptic. A timeout was performed prior to the initiation of the procedure. Under sterile conditions, the existing right nephrostomy was injected with contrast. Fluoroscopic imaging performed. Nephrostomy retention loop in the renal pelvis in good position. Mild hydronephrosis noted. No evidence of contrast extravasation or communication to any vascular source. No obvious site of bleeding. IMPRESSION: Right nephrostomy in good position. No signs of active bleeding or contrast extravasation. Electronically Signed   By: Jerilynn Mages.  Shick M.D.   On: 01/02/2019 15:15   ASSESSMENT AND PLAN:  Blake [name] is an 85 y.o. male with a known history of AAA, CHF, DM, HTN, PPM, HLD who was discharged yesterday after being admitted here for Pseudomonas  bacteremia with urinary obstruction status post bilateral nephrostomy and PICC line placement for which he has been getting IV cefepime. He had a seizure episode after going home and this was new onset for him.  1.  Seizure of new onset. -    We will place him on seizure precautions.  - He will be placed on PRN IV Ativan.   - neurology consult  by Dr. Irish Elders-- Hold off antiseizure meds since this is provoked with recent sepsis and on cefepime (lowers seizure threshold)  2.  Bilateral lower lobe pneumonia, likely aspiration.     -on IV meropenem  3.  Acute kidney injury superimposed on chronic kidney disease-IV due to obstruction from bilateral hydroureteronephrosis with history of bladder cancer, status post bilateral nephrostomy tubes.   -We will hydrate him with IV normal saline --now since on po diet will d/c IVF  4. Essential Hypertension.  -continue Norvasc and metoprolol.   5. DM Type II.  Will place on supplemental coverage with NovoLog and follow  fingerstick blood glucose.  Will resume Januvia.  6. Acute on chronic anemia secondary to multiple medical reasons including nephrectomy tube bleeding. -Workup done cannot find source of bleeding. -Transfuse as needed. Hemoglobin 7.9  7. Hyperlipidemia.  Resume Crestor.   8. BPH.  Resume Flomax and finasteride  9.  DVT prophylaxis.   avoid antiplatelet secondary to anemia and losing in nephrectomy tube. Will use SCD  CODE STATUS: full   TOTAL TIME TAKING CARE OF THIS PATIENT: *30 minutes.  >50% time spent on counselling and coordination of care  POSSIBLE D/C IN *couple* DAYS, DEPENDING ON CLINICAL CONDITION.  Note: This dictation was prepared with Dragon dictation along with smaller phrase technology. Any transcriptional errors that result from this process are unintentional.  Fritzi Mandes M.D on 01/03/2019 at 1:17 PM  Between 7am to 6pm - Pager - [phone]  After 6pm go to [email or web address] - password [email or web address]  Office  [phone]  CC: Primary care physician; Adin Hector, MDPatient ID: Blake [name], male   DOB: 02/03/1923, 85 y.o.   MRN: [id number]

## 2019-01-04 DIAGNOSIS — Z9889 Other specified postprocedural states: Secondary | ICD-10-CM

## 2019-01-04 DIAGNOSIS — D649 Anemia, unspecified: Secondary | ICD-10-CM

## 2019-01-04 LAB — CBC
HCT: 24.2 % — ABNORMAL LOW (ref 39.0–52.0)
Hemoglobin: 7.3 g/dL — ABNORMAL LOW (ref 13.0–17.0)
MCH: 26.8 pg (ref 26.0–34.0)
MCHC: 30.2 g/dL (ref 30.0–36.0)
MCV: 89 fL (ref 80.0–100.0)
Platelets: 119 10*3/uL — ABNORMAL LOW (ref 150–400)
RBC: 2.72 MIL/uL — ABNORMAL LOW (ref 4.22–5.81)
RDW: 16.3 % — ABNORMAL HIGH (ref 11.5–15.5)
WBC: 18.6 10*3/uL — ABNORMAL HIGH (ref 4.0–10.5)
nRBC: 0 % (ref 0.0–0.2)

## 2019-01-04 LAB — GLUCOSE, CAPILLARY
Glucose-Capillary: 128 mg/dL — ABNORMAL HIGH (ref 70–99)
Glucose-Capillary: 133 mg/dL — ABNORMAL HIGH (ref 70–99)
Glucose-Capillary: 200 mg/dL — ABNORMAL HIGH (ref 70–99)
Glucose-Capillary: 199 mg/dL — ABNORMAL HIGH (ref 70–99)

## 2019-01-04 LAB — PREPARE RBC (CROSSMATCH)

## 2019-01-04 LAB — CREATININE, SERUM
Creatinine, Ser: 5.03 mg/dL — ABNORMAL HIGH (ref 0.61–1.24)
GFR calc Af Amer: 11 mL/min — ABNORMAL LOW (ref >60)
GFR calc non Af Amer: 9 mL/min — ABNORMAL LOW (ref >60)

## 2019-01-04 MED ORDER — SODIUM CHLORIDE 0.45 % IV SOLN
INTRAVENOUS | Status: AC
  Administered 2019-01-04: 14:00:00 via INTRAVENOUS

## 2019-01-04 MED ORDER — SODIUM CHLORIDE 0.9% IV SOLUTION
Freq: Once | INTRAVENOUS | Status: AC
  Administered 2019-01-04: 17:00:00 via INTRAVENOUS

## 2019-01-04 NOTE — TOC Initial Note (Signed)
Transition of Care [facility]) - Initial/Assessment Note    Patient Details  Name: Blake [name] MRN: [id number] Date of Birth: May 25, 1923  Transition of Care [facility]) CM/SW Contact:    Shelbie Hutching, RN Phone Number: 01/04/2019, 11:27 AM  Clinical Narrative:                 Patient admitted after a seizure same day as discharged from the hospital for bilateral nephrostomy tube placement.  Patient is from home and lives with his daughter.  Patient has home health services with [facility] and plan is for patient to discharge back home and continue home health services.  No DME needs, patient has great family support.  RNCM will cont to follow until discharge.    Expected Discharge Plan: [place] Barriers to Discharge: Continued Medical Work up   Patient Goals and CMS Choice Patient states their goals for this hospitalization and ongoing recovery are:: to get back home with home health      Expected Discharge Plan and Services Expected Discharge Plan: [place]   Discharge Planning Services: CM Consult Post Acute Care Choice: Resumption of Svcs/PTA Provider Living arrangements for the past 2 months: Single Family Home Expected Discharge Date: 01/06/19                           [facility] Agency: [place] ([place]) Date HH Agency Contacted: 01/04/19 Time [place]: 1125 Representative spoke with at [place]: Floydene Flock  Prior Living Arrangements/Services Living arrangements for the past 2 months: [place] Lives with:: Adult Children Patient language and need for interpreter reviewed:: No Do you feel safe going back to the place where you live?: Yes      Need for Family Participation in Patient Care: Yes (Comment) Care giver support system in place?: Yes (comment)(daughter is caregiver)   Criminal Activity/Legal Involvement Pertinent to Current Situation/Hospitalization: No - Comment as  needed  Activities of Daily Living Home Assistive Devices/Equipment: Wheelchair, Environmental consultant (specify type), Other (Comment)(front wheel walker) ADL Screening (condition at time of admission) Patient's cognitive ability adequate to safely complete daily activities?: No Is the patient deaf or have difficulty hearing?: Yes(left better than right ) Does the patient have difficulty seeing, even when wearing glasses/contacts?: No Does the patient have difficulty concentrating, remembering, or making decisions?: Yes Patient able to express need for assistance with ADLs?: Yes Does the patient have difficulty dressing or bathing?: Yes Independently performs ADLs?: No Communication: Independent Is this a change from baseline?: Pre-admission baseline Dressing (OT): Needs assistance Is this a change from baseline?: Pre-admission baseline Grooming: Needs assistance Is this a change from baseline?: Pre-admission baseline Feeding: Needs assistance Is this a change from baseline?: Change from baseline, expected to last <3 days Bathing: Needs assistance Is this a change from baseline?: Pre-admission baseline Toileting: Needs assistance Is this a change from baseline?: Pre-admission baseline In/Out Bed: Needs assistance Is this a change from baseline?: Pre-admission baseline Walks in Home: Needs assistance Is this a change from baseline?: Pre-admission baseline Does the patient have difficulty walking or climbing stairs?: Yes Weakness of Legs: Both Weakness of Arms/Hands: Both  Permission Sought/Granted Permission sought to share information with : Case Manager, Family Supports Permission granted to share information with : Yes, Verbal Permission Granted     Permission granted to share info w AGENCY: [place] granted to share info w Relationship:  daughter     Emotional Assessment Appearance:: Appears stated age Attitude/Demeanor/Rapport: Engaged Affect (typically  observed): Accepting Orientation: : Oriented to Self, Oriented to Place Alcohol / Substance Use: Not Applicable Psych Involvement: No (comment)  Admission diagnosis:  Seizure ([place]) [R56.9] Disorientation [R41.0] CKD (chronic kidney disease) stage 5, GFR less than 15 ml/min (HCC) [N18.5] Patient Active Problem List   Diagnosis Date Noted  . Seizure ([place]) 01/03/2019  . Acute CHF (congestive heart failure) ([place]) 12/25/2018  . Acute renal failure (ARF) ([place]) 11/21/2018  . AKI (acute kidney injury) ([place])   . Goals of care, counseling/discussion   . Palliative care by specialist   . DNR (do not resuscitate) discussion   . Seizure-like activity ([place]) 11/05/2018  . Allergic rhinitis 07/24/2018  . Cardiac pacemaker in situ 07/24/2018  . History of sleep apnea 07/24/2018  . Macular degeneration 07/24/2018  . Trigeminal neuralgia pain 01/11/2018  . TMJ pain dysfunction syndrome 01/11/2018  . Recurrent UTI 09/26/2017  . 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 Pressure injury of skin 09/12/2017  . SIRS (systemic inflammatory response syndrome) ([place]) 09/10/2017  . Chronic cystitis 08/25/2017  . Sepsis ([place]) 06/10/2017  . CAP (community acquired pneumonia) 06/10/2017  . Infection due to enterococcus 06/07/2017  . UTI (urinary tract infection) 05/17/2017  . Blood in stool   . Hemorrhage of rectum and anus   . Lower GI bleed 10/20/2016  . Near syncope   . Acute renal failure superimposed on chronic kidney disease ([place])   . Pneumonia 10/09/2016  . Aspiration pneumonia ([place]) 10/09/2016  . Acute respiratory failure ([place]) 10/09/2016  . Hydronephrosis, bilateral 07/19/2016  . Spinal stenosis of lumbar region with neurogenic claudication 07/19/2016  . AAA (abdominal aortic aneurysm) without rupture ([place]) 04/27/2016  . Arthritis, degenerative 03/31/2016  . A-fib ([place]) 03/31/2016  . HLD (hyperlipidemia) 03/31/2016  . CA of prostate ([place]) 03/31/2016  . Central perforation of  tympanic membrane of right ear 03/26/2016  . 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 Presbycusis of left ear with restricted hearing of right ear 03/01/2016  . Chronic kidney disease, stage 3 (moderate) ([place]) 10/30/2015  . Episode of unresponsiveness 10/30/2015  . Sick sinus syndrome ([place]) 10/30/2015  . Malignant neoplasm of overlapping sites of bladder ([place]) 09/26/2015  . Urinary retention 09/17/2015  . Bladder neoplasm of uncertain malignant potential 08/13/2015  . Thrombocytopenia, unspecified ([place]) 07/24/2015  . Fitting or adjustment of cardiac pacemaker 05/09/2015  . SVT (supraventricular tachycardia) ([place]) 04/09/2015  . Diabetes mellitus, type 2 ([place]) 04/09/2015  . Benign hypertension 04/09/2015  . Acute myocardial infarction, initial episode of care ([place]) 04/09/2015  . Phimosis 01/30/2014  . Bladder outlet obstruction 04/18/2013  . Dysuria 04/18/2013  . Gross hematuria 04/18/2013  . Urethral valve, acquired 04/18/2013  . Incomplete emptying of bladder 11/26/2012  . Personal history of prostate cancer 11/26/2012   PCP:  Adin Hector, MD Pharmacy:   [facility] [place], [place] [address] Phone: [phone] Fax: [place], [place] - [address] [place] [address] Phone: [phone] Fax: [phone]  [facility] [place], [place] - [place] [address] Phone: [phone] Fax: [phone]  [id number] [address], [place] [address] Phone: [phone] Fax: [phone]  Social Determinants of Health (SDOH) Interventions    Readmission Risk Interventions Readmission Risk Prevention Plan 12/30/2018 12/25/2018 11/24/2018  Transportation Screening Complete  Complete Complete  PCP or Specialist Appt within 3-5 Days - - Complete  HRI or [place] - Complete Complete  Social Work Consult for [place] Planning/Counseling - Complete (No Data)  Palliative Care Screening - - Not Applicable  Medication Review Press photographer) Complete Complete Complete  HRI or Home Care Consult Complete - -  Some recent data might be hidden

## 2019-01-04 NOTE — Progress Notes (Signed)
[facility]  ROUNDING NOTE   Subjective:   Patient is readmitted because he had a seizure soon after he got home.   06/24 0701 - 06/25 0700 In: 440.7 [P.O.:237; I.V.:103.7; IV Piggyback:100] Out: 1175 [Urine:1175] Slow to answer this morning States he ate his breakfast  Objective:  Vital signs in last 24 hours:  Temp:  [98.7 F (37.1 C)-99.1 F (37.3 C)] 98.7 F (37.1 C) (06/25 0802) Pulse Rate:  [86-121] 92 (06/25 0802) Resp:  [18-20] 18 (06/25 0802) BP: (104-138)/(50-77) 104/50 (06/25 0802) SpO2:  [93 %-99 %] 93 % (06/25 0802)  Weight change:  Filed Weights   01/03/19 1222  Weight: 91.8 kg    Intake/Output: I/O last 3 completed shifts: In: 1440.7 [P.O.:237; I.V.:103.7; IV Piggyback:1100] Out: 1175 [Urine:1175]   Intake/Output this shift:  Total I/O In: 500 [P.O.:400; IV Piggyback:100] Out: -   Physical Exam: General: NAD, frail, elderly  Head: Normocephalic, atraumatic.  Dry oral mucous membranes  Eyes: Anicteric,   Neck: Supple,    Lungs:  Clear to auscultation  Heart: Regular rate and rhythm  Abdomen:   Soft, nontender  Extremities:  trace  peripheral edema.  Neurologic:  Able to answer questions, oriented to self and place  Skin: No lesions   GU Bilateral percutaneous nephrostomy tube, clear on the left, dark on the right    Basic Metabolic Panel: Recent Labs  Lab 12/30/18 0655 12/31/18 1009 01/02/19 1523 01/02/19 2014 01/03/19 0444 01/04/19 0720  NA 146* 142 135 138 139  --   K 3.8 4.4 3.3* 3.2* 4.3  --   CL 105 102 99 100 103  --   CO2 26 27 25 22 22   --   GLUCOSE 149* 155* 157* 190* 203*  --   BUN 72* 76* 77* 76* 71*  --   CREATININE 5.07* 4.98* 4.85* 4.95* 4.85* 5.03*  CALCIUM 8.8* 8.8* 8.3* 8.6* 8.4*  --   MG  --   --   --   --  1.7  --   PHOS  --  5.0*  --   --   --   --     Liver Function Tests: Recent Labs  Lab 12/31/18 1009 01/02/19 2014  AST  --  25  ALT  --  20  ALKPHOS  --  63  BILITOT  --  0.3  PROT  --   8.2*  ALBUMIN 2.9* 3.1*   Recent Labs  Lab 01/02/19 2014  LIPASE 47   No results for input(s): AMMONIA in the last 168 hours.  CBC: Recent Labs  Lab 12/29/18 0112 12/30/18 0655 01/01/19 1449 01/02/19 2014 01/03/19 0444 01/04/19 0720  WBC 8.4 10.0  --  9.3 20.4* 18.6*  NEUTROABS  --   --   --  6.7  --   --   HGB 8.2* 9.0* 8.3* 7.7* 7.9* 7.3*  HCT 26.7* 29.6*  --  25.4* 25.9* 24.2*  MCV 86.4 88.9  --  86.7 87.5 89.0  PLT 134* 125*  --  144* 136* 119*    Cardiac Enzymes: No results for input(s): CKTOTAL, CKMB, CKMBINDEX, TROPONINI in the last 168 hours.  BNP: Invalid input(s): POCBNP  CBG: Recent Labs  Lab 01/03/19 0845 01/03/19 1217 01/03/19 1654 01/03/19 2122 01/04/19 0731  GLUCAP 166* 140* 147* 140* 128*    Microbiology: Results for orders placed or performed during the hospital encounter of 01/02/19  Blood Culture (routine x 2)     Status: None (Preliminary result)  Collection Time: 01/02/19  8:07 PM   Specimen: BLOOD  Result Value Ref Range Status   Specimen Description BLOOD BLOOD RIGHT HAND  Final   Special Requests   Final    BOTTLES DRAWN AEROBIC AND ANAEROBIC Blood Culture results may not be optimal due to an excessive volume of blood received in culture bottles   Culture   Final    NO GROWTH < 12 HOURS Performed at [facility], [address], [place], [place] [address]    Report Status PENDING  Incomplete  Blood Culture (routine x 2)     Status: None (Preliminary result)   Collection Time: 01/02/19  8:12 PM   Specimen: BLOOD  Result Value Ref Range Status   Specimen Description BLOOD BLOOD LEFT HAND  Final   Special Requests   Final    BOTTLES DRAWN AEROBIC AND ANAEROBIC Blood Culture adequate volume   Culture   Final    NO GROWTH < 12 HOURS Performed at [facility], [address], [place], [place] [address]    Report Status PENDING  Incomplete  SARS Coronavirus 2 (CEPHEID- Performed in [facility] hospital lab),  Hosp Order     Status: None   Collection Time: 01/02/19  8:52 PM   Specimen: Nasopharyngeal Swab  Result Value Ref Range Status   SARS Coronavirus 2 NEGATIVE NEGATIVE Final    Comment: (NOTE) If result is NEGATIVE SARS-CoV-2 target nucleic acids are NOT DETECTED. The SARS-CoV-2 RNA is generally detectable in upper and lower  respiratory specimens during the acute phase of infection. The lowest  concentration of SARS-CoV-2 viral copies this assay can detect is 250  copies / mL. A negative result does not preclude SARS-CoV-2 infection  and should not be used as the sole basis for treatment or other  patient management decisions.  A negative result may occur with  improper specimen collection / handling, submission of specimen other  than nasopharyngeal swab, presence of viral mutation(s) within the  areas targeted by this assay, and inadequate number of viral copies  (<250 copies / mL). A negative result must be combined with clinical  observations, patient history, and epidemiological information. If result is POSITIVE SARS-CoV-2 target nucleic acids are DETECTED. The SARS-CoV-2 RNA is generally detectable in upper and lower  respiratory specimens dur ing the acute phase of infection.  Positive  results are indicative of active infection with SARS-CoV-2.  Clinical  correlation with patient history and other diagnostic information is  necessary to determine patient infection status.  Positive results do  not rule out bacterial infection or co-infection with other viruses. If result is PRESUMPTIVE POSTIVE SARS-CoV-2 nucleic acids MAY BE PRESENT.   A presumptive positive result was obtained on the submitted specimen  and confirmed on repeat testing.  While 2019 novel coronavirus  (SARS-CoV-2) nucleic acids may be present in the submitted sample  additional confirmatory testing may be necessary for epidemiological  and / or clinical management purposes  to differentiate between   SARS-CoV-2 and other Sarbecovirus currently known to infect humans.  If clinically indicated additional testing with an alternate test  methodology [phone]) is advised. The SARS-CoV-2 RNA is generally  detectable in upper and lower respiratory sp ecimens during the acute  phase of infection. The expected result is Negative. Fact Sheet for Patients:  StrictlyIdeas.no Fact Sheet for Healthcare Providers: BankingDealers.co.za This test is not yet approved or cleared by the [place] FDA and has been authorized for detection and/or diagnosis of SARS-CoV-2  by FDA under an Emergency Use Authorization (EUA).  This EUA will remain in effect (meaning this test can be used) for the duration of the COVID-19 declaration under Section 564(b)(1) of the Act, 21 U.S.C. section 360bbb-3(b)(1), unless the authorization is terminated or revoked sooner. Performed at [facility], [address] [place], [place] [address]   Urine culture     Status: None   Collection Time: 01/02/19  8:52 PM   Specimen: Urine, Random  Result Value Ref Range Status   Specimen Description   Final    URINE, RANDOM Performed at [facility], [address] [place], [place] [address]    Special Requests   Final    NONE Performed at [facility], [address], [place], [place] [address]    Culture   Final    NO GROWTH Performed at [facility], [place] [address] [place], [place] [address]    Report Status 01/03/2019 FINAL  Final  MRSA PCR Screening     Status: None   Collection Time: 01/03/19  8:59 PM   Specimen: Nasal Mucosa; Nasopharyngeal  Result Value Ref Range Status   MRSA by PCR NEGATIVE NEGATIVE Final    Comment:        The GeneXpert MRSA Assay (FDA approved for NASAL specimens only), is one component of a comprehensive MRSA colonization surveillance program. It is not intended to diagnose MRSA infection nor to  guide or monitor treatment for MRSA infections. Performed at [facility], [address] [place], [place] [address]     Coagulation Studies: Recent Labs    01/02/19 10-25-2012  LABPROT 15.8*  INR 1.3*    Urinalysis: Recent Labs    01/02/19 2052  COLORURINE YELLOW*  LABSPEC 1.014  PHURINE 5.0  GLUCOSEU 50*  HGBUR LARGE*  BILIRUBINUR NEGATIVE  KETONESUR NEGATIVE  PROTEINUR 100*  NITRITE NEGATIVE  LEUKOCYTESUR MODERATE*      Imaging: Ct Abdomen Pelvis Wo Contrast  Result Date: 01/03/2019 CLINICAL DATA:  86 year old male with persistent cough. Recent nephrostomy placement. EXAM: CT CHEST, ABDOMEN AND PELVIS WITHOUT CONTRAST TECHNIQUE: Multidetector CT imaging of the chest, abdomen and pelvis was performed following the standard protocol without IV contrast. COMPARISON:  CT of the abdomen pelvis dated 01/01/2019 FINDINGS: Evaluation of this exam is limited in the absence of intravenous contrast. Evaluation is also limited due to streak artifact caused by patient's arms. CT CHEST FINDINGS Cardiovascular: There is no cardiomegaly or pericardial effusion. Multi vessel coronary vascular calcification. Left pectoral pacemaker device. Advanced atherosclerotic calcification of the thoracic aorta. No aneurysmal dilatation. The central pulmonary arteries are grossly unremarkable. Right-sided PICC with tip in the central SVC. Mediastinum/Nodes: No hilar or mediastinal adenopathy. There is a small hiatal hernia. The esophagus is slightly patulous and contains debris. No mediastinal fluid collection. Lungs/Pleura: Small right pleural effusion. Bilateral lower lobe predominant airspace densities, new since the prior CT most consistent with pneumonia. Aspiration is not excluded. Clinical correlation is recommended. There is a 9 mm right upper lobe nodule. No pneumothorax. Small amount of debris or mucous material noted in the left mainstem bronchus. Musculoskeletal: Osteopenia with  degenerative changes of the spine. Bilateral carotid bulb calcified plaques. No acute osseous pathology. Multilevel anterior bridging osteophyte and syndesmophyte may represent DISH or related to ankylosing spondylitis. No acute osseous pathology. CT ABDOMEN PELVIS FINDINGS No intra-abdominal free air or free fluid. Hepatobiliary: No focal liver abnormality is seen. No gallstones, gallbladder wall thickening, or biliary dilatation. Pancreas: Unremarkable.  No pancreatic ductal dilatation or surrounding inflammatory changes. Spleen: Normal in size without focal abnormality. Adrenals/Urinary Tract: The adrenal glands are unremarkable. Similar position of bilateral nephrostomy tubes. There is no hydronephrosis on either side. There is high attenuating content in the right renal collecting system and pelvis extending down in the right ureter suspicious for blood product. Correlation with urinalysis recommended. There is no hydronephrosis on either side. The urinary bladder is collapsed. Stomach/Bowel: There is extensive sigmoid diverticulosis and scattered colonic diverticula without active inflammatory changes. No bowel obstruction or active inflammation. No evidence of acute appendicitis. Vascular/Lymphatic: Advanced aortoiliac atherosclerotic disease. Infrarenal abdominal aortic aneurysm measures up to 5.5 cm in greatest diameter. This is essentially unchanged compared to the prior CT by my measurements. Bilateral common iliac artery aneurysms measure 2.6 cm on the right and 2.4 cm on the left. Evaluation of the vasculature is limited in the absence of intravenous contrast. The IVC is unremarkable. No portal venous gas. There is no adenopathy. Reproductive: Prostate brachytherapy seeds. Other: None Musculoskeletal: Osteopenia with degenerative changes of the spine. No acute osseous pathology. IMPRESSION: 1. Interval development of bilateral lower lobe predominant airspace densities most consistent with pneumonia and  possible aspiration. Clinical correlation is recommended. Small right pleural effusion. 2. High attenuating content in the right renal collecting system and right ureter suspicious for blood product. Correlation with urinalysis recommended. No hydronephrosis on either side. 3. Stable position of bilateral nephrostomy tubes. 4. Extensive sigmoid and colonic diverticulosis without active inflammatory changes. No bowel obstruction. 5. Advanced aortoiliac atherosclerotic disease with a 5.5 cm infrarenal abdominal aortic aneurysm and bilateral common iliac artery aneurysms. Recommend followup by abdomen and pelvis CTA in 3-6 months, and vascular surgery referral/consultation if not already obtained. This recommendation follows ACR consensus guidelines: White Paper of the ACR Incidental Findings Committee II on Vascular Findings. J Am Coll Radiol 2013; 10:789-794. Electronically Signed   By: Anner Crete M.D.   On: 01/03/2019 00:27   Ct Head Wo Contrast  Result Date: 01/03/2019 CLINICAL DATA:  Seizure. EXAM: CT HEAD WITHOUT CONTRAST TECHNIQUE: Contiguous axial images were obtained from the base of the skull through the vertex without intravenous contrast. COMPARISON:  November 05, 2018. FINDINGS: Brain: No evidence of acute infarction, hemorrhage, hydrocephalus, extra-axial collection or mass lesion/mass effect. Volume loss and chronic microvascular ischemic changes are noted. Vascular: No hyperdense vessel or unexpected calcification. Skull: Normal. Negative for fracture or focal lesion. Sinuses/Orbits: No acute finding. Other: None. IMPRESSION: 1. No acute intracranial abnormality. 2. Again identified are chronic microvascular ischemic changes and extensive volume loss, similar to prior study dated November 05, 2018. Electronically Signed   By: Constance Holster M.D.   On: 01/03/2019 00:32   Ct Chest Wo Contrast  Result Date: 01/03/2019 CLINICAL DATA:  85 year old male with persistent cough. Recent nephrostomy  placement. EXAM: CT CHEST, ABDOMEN AND PELVIS WITHOUT CONTRAST TECHNIQUE: Multidetector CT imaging of the chest, abdomen and pelvis was performed following the standard protocol without IV contrast. COMPARISON:  CT of the abdomen pelvis dated 01/01/2019 FINDINGS: Evaluation of this exam is limited in the absence of intravenous contrast. Evaluation is also limited due to streak artifact caused by patient's arms. CT CHEST FINDINGS Cardiovascular: There is no cardiomegaly or pericardial effusion. Multi vessel coronary vascular calcification. Left pectoral pacemaker device. Advanced atherosclerotic calcification of the thoracic aorta. No aneurysmal dilatation. The central pulmonary arteries are grossly unremarkable. Right-sided PICC with tip in the central SVC. Mediastinum/Nodes: No hilar or mediastinal adenopathy. There is a small  hiatal hernia. The esophagus is slightly patulous and contains debris. No mediastinal fluid collection. Lungs/Pleura: Small right pleural effusion. Bilateral lower lobe predominant airspace densities, new since the prior CT most consistent with pneumonia. Aspiration is not excluded. Clinical correlation is recommended. There is a 9 mm right upper lobe nodule. No pneumothorax. Small amount of debris or mucous material noted in the left mainstem bronchus. Musculoskeletal: Osteopenia with degenerative changes of the spine. Bilateral carotid bulb calcified plaques. No acute osseous pathology. Multilevel anterior bridging osteophyte and syndesmophyte may represent DISH or related to ankylosing spondylitis. No acute osseous pathology. CT ABDOMEN PELVIS FINDINGS No intra-abdominal free air or free fluid. Hepatobiliary: No focal liver abnormality is seen. No gallstones, gallbladder wall thickening, or biliary dilatation. Pancreas: Unremarkable. No pancreatic ductal dilatation or surrounding inflammatory changes. Spleen: Normal in size without focal abnormality. Adrenals/Urinary Tract: The adrenal  glands are unremarkable. Similar position of bilateral nephrostomy tubes. There is no hydronephrosis on either side. There is high attenuating content in the right renal collecting system and pelvis extending down in the right ureter suspicious for blood product. Correlation with urinalysis recommended. There is no hydronephrosis on either side. The urinary bladder is collapsed. Stomach/Bowel: There is extensive sigmoid diverticulosis and scattered colonic diverticula without active inflammatory changes. No bowel obstruction or active inflammation. No evidence of acute appendicitis. Vascular/Lymphatic: Advanced aortoiliac atherosclerotic disease. Infrarenal abdominal aortic aneurysm measures up to 5.5 cm in greatest diameter. This is essentially unchanged compared to the prior CT by my measurements. Bilateral common iliac artery aneurysms measure 2.6 cm on the right and 2.4 cm on the left. Evaluation of the vasculature is limited in the absence of intravenous contrast. The IVC is unremarkable. No portal venous gas. There is no adenopathy. Reproductive: Prostate brachytherapy seeds. Other: None Musculoskeletal: Osteopenia with degenerative changes of the spine. No acute osseous pathology. IMPRESSION: 1. Interval development of bilateral lower lobe predominant airspace densities most consistent with pneumonia and possible aspiration. Clinical correlation is recommended. Small right pleural effusion. 2. High attenuating content in the right renal collecting system and right ureter suspicious for blood product. Correlation with urinalysis recommended. No hydronephrosis on either side. 3. Stable position of bilateral nephrostomy tubes. 4. Extensive sigmoid and colonic diverticulosis without active inflammatory changes. No bowel obstruction. 5. Advanced aortoiliac atherosclerotic disease with a 5.5 cm infrarenal abdominal aortic aneurysm and bilateral common iliac artery aneurysms. Recommend followup by abdomen and pelvis  CTA in 3-6 months, and vascular surgery referral/consultation if not already obtained. This recommendation follows ACR consensus guidelines: White Paper of the ACR Incidental Findings Committee II on Vascular Findings. J Am Coll Radiol 2013; 10:789-794. Electronically Signed   By: Anner Crete M.D.   On: 01/03/2019 00:27   Dg Chest Portable 1 View  Result Date: 01/02/2019 CLINICAL DATA:  85 year old male with cough. EXAM: PORTABLE CHEST 1 VIEW COMPARISON:  Chest radiograph dated 12/25/2018 FINDINGS: Right-sided PICC with tip over the mediastinum at the level of the aortic arch. There is diffuse chronic interstitial coarsening. Bibasilar linear atelectasis/scarring noted. There has been interval clearing of the previously seen bibasilar densities. No focal consolidation, pleural effusion, or pneumothorax. Stable cardiac silhouette. Left pectoral pacemaker device. Atherosclerotic calcification of the aorta. Osteopenia with degenerative changes of the spine. No acute osseous pathology. IMPRESSION: 1. No acute cardiopulmonary process. 2. Right-sided PICC with tip over the mediastinum at the level of the aortic arch. Electronically Signed   By: Anner Crete M.D.   On: 01/02/2019 20:41   Dg Nephrostogram Right  Thru Existing Access  Result Date: 01/02/2019 INDICATION: Bilateral nephrostomies, right nephrostomy bloody output EXAM: RIGHT NEPHROSTOGRAM THROUGH EXISTING NEPHROSTOMY COMPARISON:  12/29/2018, 01/01/2019 MEDICATIONS: None. ANESTHESIA/SEDATION: Moderate Sedation Time:  None. The patient was continuously monitored during the procedure by the interventional radiology nurse under my direct supervision. CONTRAST:  20 cc dilute Omnipaque 300-administered into the collecting system(s) FLUOROSCOPY TIME:  Fluoroscopy Time: 0 minutes 24 seconds (9 mGy). COMPLICATIONS: None immediate. PROCEDURE: Informed written consent was obtained from the patient after a thorough discussion of the procedural risks,  benefits and alternatives. All questions were addressed. Maximal Sterile Barrier Technique was utilized including caps, mask, sterile gowns, sterile gloves, sterile drape, hand hygiene and skin antiseptic. A timeout was performed prior to the initiation of the procedure. Under sterile conditions, the existing right nephrostomy was injected with contrast. Fluoroscopic imaging performed. Nephrostomy retention loop in the renal pelvis in good position. Mild hydronephrosis noted. No evidence of contrast extravasation or communication to any vascular source. No obvious site of bleeding. IMPRESSION: Right nephrostomy in good position. No signs of active bleeding or contrast extravasation. Electronically Signed   By: Jerilynn Mages.  Shick M.D.   On: 01/02/2019 15:15     Medications:   . meropenem (MERREM) IV 500 mg (01/04/19 1000)   . amLODipine  2.5 mg Oral Daily  . aspirin EC  81 mg Oral Daily  . feeding supplement (GLUCERNA SHAKE)  237 mL Oral BID BM  . ferrous sulfate  325 mg Oral Daily  . finasteride  5 mg Oral Daily  . insulin aspart  0-9 Units Subcutaneous TID WC  . linagliptin  5 mg Oral Daily  . metoprolol tartrate  37.5 mg Oral BID  . multivitamin with minerals  1 tablet Oral Daily  . multivitamin-lutein   Oral BID  . pantoprazole  40 mg Oral Daily  . rosuvastatin  5 mg Oral QPM  . sodium bicarbonate  650 mg Oral BID  . tamsulosin  0.4 mg Oral Daily   acetaminophen **OR** acetaminophen, LORazepam, magnesium hydroxide, ondansetron **OR** ondansetron (ZOFRAN) IV  Assessment/ Plan:  [name] is a 85 y.o. black male with high-grade bladder cancer, incompletely resected tumor, severe bilateral hydronephrosis, history of prostate cancer, recurrent UTIs, atrial fibrillation, pacemaker/defibrillator, diabetes mellitus type II who is admitted to [facility] on 01/02/2019 for bilateral hydronephrosis  1. Acute renal failure with obstructive uropathy, bilateral hydronephrosis. On Chronic kidney disease  stage IV.  Baseline creatinine 2.22, GFR of 28; 09/13/2018 Status post left percutaneous nephrostomy tube placement by IR on 6/16. Right placed on on 6/19.  -Right nephrostomy is blood-tinged.  Nephrostogram done on June 23 shows the tube is in the correct position Serum creatinine staying elevated at 5 We will give some maintenance IV fluids as patient appears to be clinically dry  2.  Anemia of chronic kidney disease:  Lab Results  Component Value Date   HGB 7.3 (L) 01/04/2019  not candidate for Procrit due to malignancy     LOS: 1 Blake [name] 6/25/20201:22 PM

## 2019-01-04 NOTE — Progress Notes (Signed)
[facility] NAME: Roston Grunewald    MR#:  [id number]  DATE OF BIRTH:  15-Jan-1923  SUBJECTIVE:   Patient admitted with possible seizure like activity when he went home.No seizure like activity noted here. pt is more awake REVIEW OF SYSTEMS:   Review of Systems  Unable to perform ROS: Medical condition    DRUG ALLERGIES:   Allergies  Allergen Reactions  . Diovan [Valsartan] Cough  . Gabapentin Other (See Comments)    Sedation at all doses  . Hydralazine Itching  . Lisinopril Cough  . Pregabalin Other (See Comments)    Sedation at all doses  . Levofloxacin Other (See Comments)    Too many side effects. Nausea, vomiting, upset stomach, increased confusion, etc Other reaction(s): Confusion Nausea, chills Nausea, chills   . Sulfamethoxazole-Trimethoprim Other (See Comments)    Too many side effects. Nausea, vomiting, upset stomach, increased confusion, etc    VITALS:  Blood pressure (!) 104/50, pulse 92, temperature 98.7 F (37.1 C), temperature source Oral, resp. rate 18, height 6\' 2"  (1.88 m), weight 91.8 kg, SpO2 93 %.  PHYSICAL EXAMINATION:   Physical Exam  GENERAL:  85 y.o.-year-old patient lying in the bed with no acute distress. Chronically ill, pallor+ EYES: Pupils equal, round, reactive to light and accommodation. No scleral icterus. Extraocular muscles intact.  HEENT: Head atraumatic, normocephalic. Oropharynx and nasopharynx clear.  NECK:  Supple, no jugular venous distention. No thyroid enlargement, no tenderness.  LUNGS: Normal breath sounds bilaterally, no wheezing, rales, rhonchi. No use of accessory muscles of respiration.  CARDIOVASCULAR: S1, S2 normal. No murmurs, rubs, or gallops.  ABDOMEN: Soft, nontender, nondistended. Bowel sounds present. No organomegaly or mass. Bilateral nephrostomy tubes present EXTREMITIES: No cyanosis, clubbing or edema b/l.    NEUROLOGIC: Cranial nerves II through XII  are intact. No focal Motor or sensory deficits b/l. Moves all extremities well, deconditioned PSYCHIATRIC:  patient is alert but appears a bit confused SKIN: No obvious rash, lesion, or ulcer.   LABORATORY PANEL:  CBC Recent Labs  Lab 01/04/19 0720  WBC 18.6*  HGB 7.3*  HCT 24.2*  PLT 119*    Chemistries  Recent Labs  Lab 01/02/19 2014 01/03/19 0444 01/04/19 0720  NA 138 139  --   K 3.2* 4.3  --   CL 100 103  --   CO2 22 22  --   GLUCOSE 190* 203*  --   BUN 76* 71*  --   CREATININE 4.95* 4.85* 5.03*  CALCIUM 8.6* 8.4*  --   MG  --  1.7  --   AST 25  --   --   ALT 20  --   --   ALKPHOS 63  --   --   BILITOT 0.3  --   --    Cardiac Enzymes No results for input(s): TROPONINI in the last 168 hours. RADIOLOGY:  Ct Abdomen Pelvis Wo Contrast  Result Date: 01/03/2019 CLINICAL DATA:  85 year old male with persistent cough. Recent nephrostomy placement. EXAM: CT CHEST, ABDOMEN AND PELVIS WITHOUT CONTRAST TECHNIQUE: Multidetector CT imaging of the chest, abdomen and pelvis was performed following the standard protocol without IV contrast. COMPARISON:  CT of the abdomen pelvis dated 01/01/2019 FINDINGS: Evaluation of this exam is limited in the absence of intravenous contrast. Evaluation is also limited due to streak artifact caused by patient's arms. CT CHEST FINDINGS Cardiovascular: There is no cardiomegaly or pericardial effusion. Multi vessel coronary vascular calcification.  Left pectoral pacemaker device. Advanced atherosclerotic calcification of the thoracic aorta. No aneurysmal dilatation. The central pulmonary arteries are grossly unremarkable. Right-sided PICC with tip in the central SVC. Mediastinum/Nodes: No hilar or mediastinal adenopathy. There is a small hiatal hernia. The esophagus is slightly patulous and contains debris. No mediastinal fluid collection. Lungs/Pleura: Small right pleural effusion. Bilateral lower lobe predominant airspace densities, new since the prior  CT most consistent with pneumonia. Aspiration is not excluded. Clinical correlation is recommended. There is a 9 mm right upper lobe nodule. No pneumothorax. Small amount of debris or mucous material noted in the left mainstem bronchus. Musculoskeletal: Osteopenia with degenerative changes of the spine. Bilateral carotid bulb calcified plaques. No acute osseous pathology. Multilevel anterior bridging osteophyte and syndesmophyte may represent DISH or related to ankylosing spondylitis. No acute osseous pathology. CT ABDOMEN PELVIS FINDINGS No intra-abdominal free air or free fluid. Hepatobiliary: No focal liver abnormality is seen. No gallstones, gallbladder wall thickening, or biliary dilatation. Pancreas: Unremarkable. No pancreatic ductal dilatation or surrounding inflammatory changes. Spleen: Normal in size without focal abnormality. Adrenals/Urinary Tract: The adrenal glands are unremarkable. Similar position of bilateral nephrostomy tubes. There is no hydronephrosis on either side. There is high attenuating content in the right renal collecting system and pelvis extending down in the right ureter suspicious for blood product. Correlation with urinalysis recommended. There is no hydronephrosis on either side. The urinary bladder is collapsed. Stomach/Bowel: There is extensive sigmoid diverticulosis and scattered colonic diverticula without active inflammatory changes. No bowel obstruction or active inflammation. No evidence of acute appendicitis. Vascular/Lymphatic: Advanced aortoiliac atherosclerotic disease. Infrarenal abdominal aortic aneurysm measures up to 5.5 cm in greatest diameter. This is essentially unchanged compared to the prior CT by my measurements. Bilateral common iliac artery aneurysms measure 2.6 cm on the right and 2.4 cm on the left. Evaluation of the vasculature is limited in the absence of intravenous contrast. The IVC is unremarkable. No portal venous gas. There is no adenopathy.  Reproductive: Prostate brachytherapy seeds. Other: None Musculoskeletal: Osteopenia with degenerative changes of the spine. No acute osseous pathology. IMPRESSION: 1. Interval development of bilateral lower lobe predominant airspace densities most consistent with pneumonia and possible aspiration. Clinical correlation is recommended. Small right pleural effusion. 2. High attenuating content in the right renal collecting system and right ureter suspicious for blood product. Correlation with urinalysis recommended. No hydronephrosis on either side. 3. Stable position of bilateral nephrostomy tubes. 4. Extensive sigmoid and colonic diverticulosis without active inflammatory changes. No bowel obstruction. 5. Advanced aortoiliac atherosclerotic disease with a 5.5 cm infrarenal abdominal aortic aneurysm and bilateral common iliac artery aneurysms. Recommend followup by abdomen and pelvis CTA in 3-6 months, and vascular surgery referral/consultation if not already obtained. This recommendation follows ACR consensus guidelines: White Paper of the ACR Incidental Findings Committee II on Vascular Findings. J Am Coll Radiol 2013; 10:789-794. Electronically Signed   By: Anner Crete M.D.   On: 01/03/2019 00:27   Ct Head Wo Contrast  Result Date: 01/03/2019 CLINICAL DATA:  Seizure. EXAM: CT HEAD WITHOUT CONTRAST TECHNIQUE: Contiguous axial images were obtained from the base of the skull through the vertex without intravenous contrast. COMPARISON:  November 05, 2018. FINDINGS: Brain: No evidence of acute infarction, hemorrhage, hydrocephalus, extra-axial collection or mass lesion/mass effect. Volume loss and chronic microvascular ischemic changes are noted. Vascular: No hyperdense vessel or unexpected calcification. Skull: Normal. Negative for fracture or focal lesion. Sinuses/Orbits: No acute finding. Other: None. IMPRESSION: 1. No acute intracranial abnormality. 2. Again identified  are chronic microvascular ischemic  changes and extensive volume loss, similar to prior study dated November 05, 2018. Electronically Signed   By: Constance Holster M.D.   On: 01/03/2019 00:32   Ct Chest Wo Contrast  Result Date: 01/03/2019 CLINICAL DATA:  85 year old male with persistent cough. Recent nephrostomy placement. EXAM: CT CHEST, ABDOMEN AND PELVIS WITHOUT CONTRAST TECHNIQUE: Multidetector CT imaging of the chest, abdomen and pelvis was performed following the standard protocol without IV contrast. COMPARISON:  CT of the abdomen pelvis dated 01/01/2019 FINDINGS: Evaluation of this exam is limited in the absence of intravenous contrast. Evaluation is also limited due to streak artifact caused by patient's arms. CT CHEST FINDINGS Cardiovascular: There is no cardiomegaly or pericardial effusion. Multi vessel coronary vascular calcification. Left pectoral pacemaker device. Advanced atherosclerotic calcification of the thoracic aorta. No aneurysmal dilatation. The central pulmonary arteries are grossly unremarkable. Right-sided PICC with tip in the central SVC. Mediastinum/Nodes: No hilar or mediastinal adenopathy. There is a small hiatal hernia. The esophagus is slightly patulous and contains debris. No mediastinal fluid collection. Lungs/Pleura: Small right pleural effusion. Bilateral lower lobe predominant airspace densities, new since the prior CT most consistent with pneumonia. Aspiration is not excluded. Clinical correlation is recommended. There is a 9 mm right upper lobe nodule. No pneumothorax. Small amount of debris or mucous material noted in the left mainstem bronchus. Musculoskeletal: Osteopenia with degenerative changes of the spine. Bilateral carotid bulb calcified plaques. No acute osseous pathology. Multilevel anterior bridging osteophyte and syndesmophyte may represent DISH or related to ankylosing spondylitis. No acute osseous pathology. CT ABDOMEN PELVIS FINDINGS No intra-abdominal free air or free fluid. Hepatobiliary:  No focal liver abnormality is seen. No gallstones, gallbladder wall thickening, or biliary dilatation. Pancreas: Unremarkable. No pancreatic ductal dilatation or surrounding inflammatory changes. Spleen: Normal in size without focal abnormality. Adrenals/Urinary Tract: The adrenal glands are unremarkable. Similar position of bilateral nephrostomy tubes. There is no hydronephrosis on either side. There is high attenuating content in the right renal collecting system and pelvis extending down in the right ureter suspicious for blood product. Correlation with urinalysis recommended. There is no hydronephrosis on either side. The urinary bladder is collapsed. Stomach/Bowel: There is extensive sigmoid diverticulosis and scattered colonic diverticula without active inflammatory changes. No bowel obstruction or active inflammation. No evidence of acute appendicitis. Vascular/Lymphatic: Advanced aortoiliac atherosclerotic disease. Infrarenal abdominal aortic aneurysm measures up to 5.5 cm in greatest diameter. This is essentially unchanged compared to the prior CT by my measurements. Bilateral common iliac artery aneurysms measure 2.6 cm on the right and 2.4 cm on the left. Evaluation of the vasculature is limited in the absence of intravenous contrast. The IVC is unremarkable. No portal venous gas. There is no adenopathy. Reproductive: Prostate brachytherapy seeds. Other: None Musculoskeletal: Osteopenia with degenerative changes of the spine. No acute osseous pathology. IMPRESSION: 1. Interval development of bilateral lower lobe predominant airspace densities most consistent with pneumonia and possible aspiration. Clinical correlation is recommended. Small right pleural effusion. 2. High attenuating content in the right renal collecting system and right ureter suspicious for blood product. Correlation with urinalysis recommended. No hydronephrosis on either side. 3. Stable position of bilateral nephrostomy tubes. 4.  Extensive sigmoid and colonic diverticulosis without active inflammatory changes. No bowel obstruction. 5. Advanced aortoiliac atherosclerotic disease with a 5.5 cm infrarenal abdominal aortic aneurysm and bilateral common iliac artery aneurysms. Recommend followup by abdomen and pelvis CTA in 3-6 months, and vascular surgery referral/consultation if not already obtained. This recommendation follows ACR  consensus guidelines: White Paper of the ACR Incidental Findings Committee II on Vascular Findings. J Am Coll Radiol 2013; 10:789-794. Electronically Signed   By: Anner Crete M.D.   On: 01/03/2019 00:27   Dg Chest Portable 1 View  Result Date: 01/02/2019 CLINICAL DATA:  85 year old male with cough. EXAM: PORTABLE CHEST 1 VIEW COMPARISON:  Chest radiograph dated 12/25/2018 FINDINGS: Right-sided PICC with tip over the mediastinum at the level of the aortic arch. There is diffuse chronic interstitial coarsening. Bibasilar linear atelectasis/scarring noted. There has been interval clearing of the previously seen bibasilar densities. No focal consolidation, pleural effusion, or pneumothorax. Stable cardiac silhouette. Left pectoral pacemaker device. Atherosclerotic calcification of the aorta. Osteopenia with degenerative changes of the spine. No acute osseous pathology. IMPRESSION: 1. No acute cardiopulmonary process. 2. Right-sided PICC with tip over the mediastinum at the level of the aortic arch. Electronically Signed   By: Anner Crete M.D.   On: 01/02/2019 20:41   ASSESSMENT AND PLAN:  BIRL LOBELLO is an 85 y.o. male with a known history of AAA, CHF, DM, HTN, PPM, HLD who was discharged yesterday after being admitted here for Pseudomonas bacteremia with urinary obstruction status post bilateral nephrostomy and PICC line placement for which he has been getting IV cefepime. He had a seizure episode after going home and this was new onset for him.  1.  Seizure of new onset. - cont  seizure  precautions.  -  PRN IV Ativan.   - neurology consult  by Dr. Irish Elders-- Hold off antiseizure meds since this is provoked with recent sepsis and on cefepime (lowers seizure threshold)  2.  Bilateral lower lobe pneumonia, likely aspiration.     -on IV meropenem  3.  Acute kidney injury superimposed on chronic kidney disease-IV due to obstruction from bilateral hydroureteronephrosis with history of bladder cancer, status post bilateral nephrostomy tubes.   -We will hydrate him with IV normal saline --now since on po diet will d/c IVF  4. Essential Hypertension.  -continue Norvasc and metoprolol.   5. DM Type II.   -on supplemental coverage with NovoLog and follow fingerstick blood glucose.   - resume Januvia.  6. Acute on chronic anemia secondary to multiple medical reasons including nephrectomy tube bleeding. -Workup done cannot find source of bleeding. -Transfuse as needed. Hemoglobin 7.9--7.3--BT 1 unit -spoke with patient's daughter Larey Brick and she gave permission for blood transfusion. Risk and complications discussed  7. Hyperlipidemia.  Resume Crestor.   8. BPH.  Resume Flomax and finasteride  9.  DVT prophylaxis.   avoid antiplatelet secondary to anemia and losing in nephrectomy tube. Will use SCD  Physical therapy to be initiated  CODE STATUS: full  Spoke with Dr crisp and updated all of the above  TOTAL TIME TAKING CARE OF THIS PATIENT: *30 minutes.  >50% time spent on counselling and coordination of care  POSSIBLE D/C IN *couple* DAYS, DEPENDING ON CLINICAL CONDITION.  Note: This dictation was prepared with Dragon dictation along with smaller phrase technology. Any transcriptional errors that result from this process are unintentional.  Fritzi Mandes M.D on 01/04/2019 at 3:13 PM  Between 7am to 6pm - Pager - [phone]  After 6pm go to [email or web address] - password [email or web address] [facility]  Office  [phone]  CC: Primary care  physician; Adin Hector, MDPatient ID: [name], male   DOB: 05-09-1923, 85 y.o.   MRN: [id number]

## 2019-01-04 NOTE — Progress Notes (Signed)
ID    Patient Vitals for the past 24 hrs:  BP Temp Temp src Pulse Resp SpO2  01/04/19 1653 (!) 109/94 (!) 100.4 F (38 C) Oral 96 20 96 %  01/04/19 1532 (!) 160/147 99.1 F (37.3 C) Oral (!) 107 20 90 %  01/04/19 0802 (!) 104/50 98.7 F (37.1 C) Oral 92 18 93 %  01/04/19 0551 138/69 99.1 F (37.3 C) Oral 93 20 98 %  01/03/19 2036 118/67 98.8 F (37.1 C) Oral (!) 121 18 99 %    He is awake, but does not focus, slow to respond Rt nephrostomy still has dark bloody urine Getting blood transfusion   CBC Latest Ref Rng & Units 01/04/2019 01/03/2019 01/02/2019  WBC 4.0 - 10.5 K/uL 18.6(H) 20.4(H) 9.3  Hemoglobin 13.0 - 17.0 g/dL 7.3(L) 7.9(L) 7.7(L)  Hematocrit 39.0 - 52.0 % 24.2(L) 25.9(L) 25.4(L)  Platelets 150 - 400 K/uL 119(L) 136(L) 144(L)   Impression/recommendation New onset seizures- r/o cefepime induced  Pseudomonas bacteremia from pseudomonas complicated UTi B/l hydroureteronephrosis from obstructive uropathy- has b/l PCN  On Meropenem which will be given until 01/19/19  AKI on CKD  Anemia- getting PRBC

## 2019-01-04 NOTE — Consult Note (Signed)
Subjective: No seizure activity. S/p EEG    Past Medical History:  Diagnosis Date  . AAA (abdominal aortic aneurysm) ([place])   . Arrhythmia   . CHF (congestive heart failure) ([place])   . Diabetes mellitus without complication ([place])   . GI bleed   . Heart murmur   . Hyperlipidemia   . Hypertension   . Pacemaker   . Prostate cancer [facility])     Past Surgical History:  Procedure Laterality Date  . FLEXIBLE SIGMOIDOSCOPY N/A 10/21/2016   Procedure: FLEXIBLE SIGMOIDOSCOPY;  Surgeon: [name], MD;  Location: [facility] ENDOSCOPY;  Service: Endoscopy;  Laterality: N/A;  . IR NEPHROSTOMY PLACEMENT RIGHT  01/01/2019  . JOINT REPLACEMENT    . NEPHROSTOMY Bilateral   . PACEMAKER INSERTION Left 04/14/2015   Procedure: INSERTION PACEMAKER;  Surgeon: Isaias Cowman, MD;  Location: [facility] ORS;  Service: Cardiovascular;  Laterality: Left;  . PROSTATE SURGERY    . VISCERAL ARTERY INTERVENTION N/A 10/22/2016   Procedure: Visceral Artery Intervention;  Surgeon: Algernon Huxley, MD;  Location: [facility] [facility];  Service: Cardiovascular;  Laterality: N/A;    Family History  Problem Relation Age of Onset  . Hypertension Other   . Stroke Other   . Heart attack Other   . Stroke Sister   . Heart attack Brother     Social History:  reports that he has quit smoking. He has never used smokeless tobacco. He reports that he does not drink alcohol or use drugs.  Allergies  Allergen Reactions  . Diovan [Valsartan] Cough  . Gabapentin Other (See Comments)    Sedation at all doses  . Hydralazine Itching  . Lisinopril Cough  . Pregabalin Other (See Comments)    Sedation at all doses  . Levofloxacin Other (See Comments)    Too many side effects. Nausea, vomiting, upset stomach, increased confusion, etc Other reaction(s): Confusion Nausea, chills Nausea, chills   . Sulfamethoxazole-Trimethoprim Other (See Comments)    Too many side effects. Nausea, vomiting, upset stomach, increased confusion, etc     Medications: I have reviewed the patient's current medications.  ROS: History obtained from the patient  General ROS: negative for - chills, fatigue, fever, night sweats, weight gain or weight loss Psychological ROS: negative for - behavioral disorder, hallucinations, memory difficulties, mood swings or suicidal ideation Ophthalmic ROS: negative for - blurry vision, double vision, eye pain or loss of vision ENT ROS: negative for - epistaxis, nasal discharge, oral lesions, sore throat, tinnitus or vertigo Allergy and Immunology ROS: negative for - hives or itchy/watery eyes Hematological and Lymphatic ROS: negative for - bleeding problems, bruising or swollen lymph nodes Endocrine ROS: negative for - galactorrhea, hair pattern changes, polydipsia/polyuria or temperature intolerance Respiratory ROS: negative for - cough, hemoptysis, shortness of breath or wheezing Cardiovascular ROS: negative for - chest pain, dyspnea on exertion, edema or irregular heartbeat Gastrointestinal ROS: negative for - abdominal pain, diarrhea, hematemesis, nausea/vomiting or stool incontinence Genito-Urinary ROS: negative for - dysuria, hematuria, incontinence or urinary frequency/urgency Musculoskeletal ROS: negative for - joint swelling or muscular weakness Neurological ROS: as noted in HPI Dermatological ROS: negative for rash and skin lesion changes  Physical Examination: Blood pressure (!) 104/50, pulse 92, temperature 98.7 F (37.1 C), temperature source Oral, resp. rate 18, height 6\' 2"  (1.88 m), weight 91.8 kg, SpO2 93 %.    Neurological Examination   Mental Status: Alert, oriented to name location and reason he is in hospital.  Cranial Nerves: II: Discs flat bilaterally; Visual  fields grossly normal, pupils equal, round, reactive to light and accommodation III,IV, VI: ptosis not present, extra-ocular motions intact bilaterally V,VII: smile symmetric, facial light touch sensation normal  bilaterally VIII: hearing normal bilaterally IX,X: gag reflex present XI: bilateral shoulder shrug XII: midline tongue extension Motor: Generalized weakness with 4/4 bilaterally.  Tone and bulk:normal tone throughout; no atrophy noted Sensory: Pinprick and light touch intact throughout, bilaterally Deep Tendon Reflexes: 1+ and symmetric throughout Plantars: Right: downgoing   Left: downgoing Cerebellar: normal finger-to-nose, normal rapid alternating movements and normal heel-to-shin test Gait: not tested       Laboratory Studies:   Basic Metabolic Panel: Recent Labs  Lab 12/30/18 0655 12/31/18 1009 01/02/19 1523 01/02/19 2014 01/03/19 0444 01/04/19 0720  NA 146* 142 135 138 139  --   K 3.8 4.4 3.3* 3.2* 4.3  --   CL 105 102 99 100 103  --   CO2 26 27 25 22 22   --   GLUCOSE 149* 155* 157* 190* 203*  --   BUN 72* 76* 77* 76* 71*  --   CREATININE 5.07* 4.98* 4.85* 4.95* 4.85* 5.03*  CALCIUM 8.8* 8.8* 8.3* 8.6* 8.4*  --   MG  --   --   --   --  1.7  --   PHOS  --  5.0*  --   --   --   --     Liver Function Tests: Recent Labs  Lab 12/31/18 1009 01/02/19 2014  AST  --  25  ALT  --  20  ALKPHOS  --  63  BILITOT  --  0.3  PROT  --  8.2*  ALBUMIN 2.9* 3.1*   Recent Labs  Lab 01/02/19 2014  LIPASE 47   No results for input(s): AMMONIA in the last 168 hours.  CBC: Recent Labs  Lab 12/29/18 0112 12/30/18 0655 01/01/19 1449 01/02/19 2014 01/03/19 0444 01/04/19 0720  WBC 8.4 10.0  --  9.3 20.4* 18.6*  NEUTROABS  --   --   --  6.7  --   --   HGB 8.2* 9.0* 8.3* 7.7* 7.9* 7.3*  HCT 26.7* 29.6*  --  25.4* 25.9* 24.2*  MCV 86.4 88.9  --  86.7 87.5 89.0  PLT 134* 125*  --  144* 136* 119*    Cardiac Enzymes: No results for input(s): CKTOTAL, CKMB, CKMBINDEX, TROPONINI in the last 168 hours.  BNP: Invalid input(s): POCBNP  CBG: Recent Labs  Lab 01/03/19 0845 01/03/19 1217 01/03/19 1654 01/03/19 2122 01/04/19 0731  GLUCAP 166* 140* 147* 140* 128*     Microbiology: Results for orders placed or performed during the hospital encounter of 01/02/19  Blood Culture (routine x 2)     Status: None (Preliminary result)   Collection Time: 01/02/19  8:07 PM   Specimen: BLOOD  Result Value Ref Range Status   Specimen Description BLOOD BLOOD RIGHT HAND  Final   Special Requests   Final    BOTTLES DRAWN AEROBIC AND ANAEROBIC Blood Culture results may not be optimal due to an excessive volume of blood received in culture bottles   Culture   Final    NO GROWTH < 12 HOURS Performed at [facility], [address] [place], [place] [address]    Report Status PENDING  Incomplete  Blood Culture (routine x 2)     Status: None (Preliminary result)   Collection Time: 01/02/19  8:12 PM   Specimen: BLOOD  Result Value Ref Range Status  Specimen Description BLOOD BLOOD LEFT HAND  Final   Special Requests   Final    BOTTLES DRAWN AEROBIC AND ANAEROBIC Blood Culture adequate volume   Culture   Final    NO GROWTH < 12 HOURS Performed at [facility], [address], [place], [place] [address]    Report Status PENDING  Incomplete  SARS Coronavirus 2 (CEPHEID- Performed in [place] hospital lab), Hosp Order     Status: None   Collection Time: 01/02/19  8:52 PM   Specimen: Nasopharyngeal Swab  Result Value Ref Range Status   SARS Coronavirus 2 NEGATIVE NEGATIVE Final    Comment: (NOTE) If result is NEGATIVE SARS-CoV-2 target nucleic acids are NOT DETECTED. The SARS-CoV-2 RNA is generally detectable in upper and lower  respiratory specimens during the acute phase of infection. The lowest  concentration of SARS-CoV-2 viral copies this assay can detect is 250  copies / mL. A negative result does not preclude SARS-CoV-2 infection  and should not be used as the sole basis for treatment or other  patient management decisions.  A negative result may occur with  improper specimen collection / handling, submission of specimen other   than nasopharyngeal swab, presence of viral mutation(s) within the  areas targeted by this assay, and inadequate number of viral copies  (<250 copies / mL). A negative result must be combined with clinical  observations, patient history, and epidemiological information. If result is POSITIVE SARS-CoV-2 target nucleic acids are DETECTED. The SARS-CoV-2 RNA is generally detectable in upper and lower  respiratory specimens dur ing the acute phase of infection.  Positive  results are indicative of active infection with SARS-CoV-2.  Clinical  correlation with patient history and other diagnostic information is  necessary to determine patient infection status.  Positive results do  not rule out bacterial infection or co-infection with other viruses. If result is PRESUMPTIVE POSTIVE SARS-CoV-2 nucleic acids MAY BE PRESENT.   A presumptive positive result was obtained on the submitted specimen  and confirmed on repeat testing.  While 2019 novel coronavirus  (SARS-CoV-2) nucleic acids may be present in the submitted sample  additional confirmatory testing may be necessary for epidemiological  and / or clinical management purposes  to differentiate between  SARS-CoV-2 and other Sarbecovirus currently known to infect humans.  If clinically indicated additional testing with an alternate test  methodology [phone]) is advised. The SARS-CoV-2 RNA is generally  detectable in upper and lower respiratory sp ecimens during the acute  phase of infection. The expected result is Negative. Fact Sheet for Patients:  StrictlyIdeas.no Fact Sheet for Healthcare Providers: BankingDealers.co.za This test is not yet approved or cleared by the [place] FDA and has been authorized for detection and/or diagnosis of SARS-CoV-2 by FDA under an Emergency Use Authorization (EUA).  This EUA will remain in effect (meaning this test can be used) for the duration of  the COVID-19 declaration under Section 564(b)(1) of the Act, 21 U.S.C. section 360bbb-3(b)(1), unless the authorization is terminated or revoked sooner. Performed at [facility], [address] [place], [place] [address]   Urine culture     Status: None   Collection Time: 01/02/19  8:52 PM   Specimen: Urine, Random  Result Value Ref Range Status   Specimen Description   Final    URINE, RANDOM Performed at [facility], [address], [place], [place] [address]    Special Requests   Final    NONE Performed at [facility], [address], [place], [place] [address]    Culture   Final    NO GROWTH Performed at [facility], [place] [address] [place], [place] [address]    Report Status 01/03/2019 FINAL  Final  MRSA PCR Screening     Status: None   Collection Time: 01/03/19  8:59 PM   Specimen: Nasal Mucosa; Nasopharyngeal  Result Value Ref Range Status   MRSA by PCR NEGATIVE NEGATIVE Final    Comment:        The GeneXpert MRSA Assay (FDA approved for NASAL specimens only), is one component of a comprehensive MRSA colonization surveillance program. It is not intended to diagnose MRSA infection nor to guide or monitor treatment for MRSA infections. Performed at [facility], [address] [place], [place] [address]     Coagulation Studies: Recent Labs    01/02/19 Oct 23, 2012  LABPROT 15.8*  INR 1.3*    Urinalysis:  Recent Labs  Lab 01/02/19 2052  COLORURINE YELLOW*  LABSPEC 1.014  PHURINE 5.0  GLUCOSEU 50*  HGBUR LARGE*  BILIRUBINUR NEGATIVE  KETONESUR NEGATIVE  PROTEINUR 100*  NITRITE NEGATIVE  LEUKOCYTESUR MODERATE*    Lipid Panel:  No results found for: CHOL, TRIG, HDL, CHOLHDL, VLDL, LDLCALC  HgbA1C:  Lab Results  Component Value Date   HGBA1C 6.9 (H) 01/03/2019    Urine Drug Screen:      Component Value Date/Time   LABOPIA NONE DETECTED 11/05/2018 10/23/25   COCAINSCRNUR NONE DETECTED 11/05/2018 2025-10-23    LABBENZ NONE DETECTED 11/05/2018 10-23-2025   AMPHETMU NONE DETECTED 11/05/2018 October 23, 2025   THCU NONE DETECTED 11/05/2018 10/23/25   LABBARB NONE DETECTED 11/05/2018 10-23-2025    Alcohol Level: No results for input(s): ETH in the last 168 hours.   Imaging: Ct Abdomen Pelvis Wo Contrast  Result Date: 01/03/2019 CLINICAL DATA:  85 year old male with persistent cough. Recent nephrostomy placement. EXAM: CT CHEST, ABDOMEN AND PELVIS WITHOUT CONTRAST TECHNIQUE: Multidetector CT imaging of the chest, abdomen and pelvis was performed following the standard protocol without IV contrast. COMPARISON:  CT of the abdomen pelvis dated 01/01/2019 FINDINGS: Evaluation of this exam is limited in the absence of intravenous contrast. Evaluation is also limited due to streak artifact caused by patient's arms. CT CHEST FINDINGS Cardiovascular: There is no cardiomegaly or pericardial effusion. Multi vessel coronary vascular calcification. Left pectoral pacemaker device. Advanced atherosclerotic calcification of the thoracic aorta. No aneurysmal dilatation. The central pulmonary arteries are grossly unremarkable. Right-sided PICC with tip in the central SVC. Mediastinum/Nodes: No hilar or mediastinal adenopathy. There is a small hiatal hernia. The esophagus is slightly patulous and contains debris. No mediastinal fluid collection. Lungs/Pleura: Small right pleural effusion. Bilateral lower lobe predominant airspace densities, new since the prior CT most consistent with pneumonia. Aspiration is not excluded. Clinical correlation is recommended. There is a 9 mm right upper lobe nodule. No pneumothorax. Small amount of debris or mucous material noted in the left mainstem bronchus. Musculoskeletal: Osteopenia with degenerative changes of the spine. Bilateral carotid bulb calcified plaques. No acute osseous pathology. Multilevel anterior bridging osteophyte and syndesmophyte may represent DISH or related to ankylosing spondylitis. No acute  osseous pathology. CT ABDOMEN PELVIS FINDINGS No intra-abdominal free air or free fluid. Hepatobiliary: No focal liver abnormality is seen. No gallstones, gallbladder wall thickening, or biliary dilatation. Pancreas: Unremarkable. No pancreatic ductal dilatation or surrounding inflammatory changes. Spleen: Normal in size without focal abnormality. Adrenals/Urinary Tract: The adrenal glands are unremarkable. Similar position of bilateral nephrostomy tubes.  There is no hydronephrosis on either side. There is high attenuating content in the right renal collecting system and pelvis extending down in the right ureter suspicious for blood product. Correlation with urinalysis recommended. There is no hydronephrosis on either side. The urinary bladder is collapsed. Stomach/Bowel: There is extensive sigmoid diverticulosis and scattered colonic diverticula without active inflammatory changes. No bowel obstruction or active inflammation. No evidence of acute appendicitis. Vascular/Lymphatic: Advanced aortoiliac atherosclerotic disease. Infrarenal abdominal aortic aneurysm measures up to 5.5 cm in greatest diameter. This is essentially unchanged compared to the prior CT by my measurements. Bilateral common iliac artery aneurysms measure 2.6 cm on the right and 2.4 cm on the left. Evaluation of the vasculature is limited in the absence of intravenous contrast. The IVC is unremarkable. No portal venous gas. There is no adenopathy. Reproductive: Prostate brachytherapy seeds. Other: None Musculoskeletal: Osteopenia with degenerative changes of the spine. No acute osseous pathology. IMPRESSION: 1. Interval development of bilateral lower lobe predominant airspace densities most consistent with pneumonia and possible aspiration. Clinical correlation is recommended. Small right pleural effusion. 2. High attenuating content in the right renal collecting system and right ureter suspicious for blood product. Correlation with urinalysis  recommended. No hydronephrosis on either side. 3. Stable position of bilateral nephrostomy tubes. 4. Extensive sigmoid and colonic diverticulosis without active inflammatory changes. No bowel obstruction. 5. Advanced aortoiliac atherosclerotic disease with a 5.5 cm infrarenal abdominal aortic aneurysm and bilateral common iliac artery aneurysms. Recommend followup by abdomen and pelvis CTA in 3-6 months, and vascular surgery referral/consultation if not already obtained. This recommendation follows ACR consensus guidelines: White Paper of the ACR Incidental Findings Committee II on Vascular Findings. J Am Coll Radiol 2013; 10:789-794. Electronically Signed   By: Anner Crete M.D.   On: 01/03/2019 00:27   Ct Head Wo Contrast  Result Date: 01/03/2019 CLINICAL DATA:  Seizure. EXAM: CT HEAD WITHOUT CONTRAST TECHNIQUE: Contiguous axial images were obtained from the base of the skull through the vertex without intravenous contrast. COMPARISON:  November 05, 2018. FINDINGS: Brain: No evidence of acute infarction, hemorrhage, hydrocephalus, extra-axial collection or mass lesion/mass effect. Volume loss and chronic microvascular ischemic changes are noted. Vascular: No hyperdense vessel or unexpected calcification. Skull: Normal. Negative for fracture or focal lesion. Sinuses/Orbits: No acute finding. Other: None. IMPRESSION: 1. No acute intracranial abnormality. 2. Again identified are chronic microvascular ischemic changes and extensive volume loss, similar to prior study dated November 05, 2018. Electronically Signed   By: Constance Holster M.D.   On: 01/03/2019 00:32   Ct Chest Wo Contrast  Result Date: 01/03/2019 CLINICAL DATA:  85 year old male with persistent cough. Recent nephrostomy placement. EXAM: CT CHEST, ABDOMEN AND PELVIS WITHOUT CONTRAST TECHNIQUE: Multidetector CT imaging of the chest, abdomen and pelvis was performed following the standard protocol without IV contrast. COMPARISON:  CT of the  abdomen pelvis dated 01/01/2019 FINDINGS: Evaluation of this exam is limited in the absence of intravenous contrast. Evaluation is also limited due to streak artifact caused by patient's arms. CT CHEST FINDINGS Cardiovascular: There is no cardiomegaly or pericardial effusion. Multi vessel coronary vascular calcification. Left pectoral pacemaker device. Advanced atherosclerotic calcification of the thoracic aorta. No aneurysmal dilatation. The central pulmonary arteries are grossly unremarkable. Right-sided PICC with tip in the central SVC. Mediastinum/Nodes: No hilar or mediastinal adenopathy. There is a small hiatal hernia. The esophagus is slightly patulous and contains debris. No mediastinal fluid collection. Lungs/Pleura: Small right pleural effusion. Bilateral lower lobe predominant airspace densities, new since the  prior CT most consistent with pneumonia. Aspiration is not excluded. Clinical correlation is recommended. There is a 9 mm right upper lobe nodule. No pneumothorax. Small amount of debris or mucous material noted in the left mainstem bronchus. Musculoskeletal: Osteopenia with degenerative changes of the spine. Bilateral carotid bulb calcified plaques. No acute osseous pathology. Multilevel anterior bridging osteophyte and syndesmophyte may represent DISH or related to ankylosing spondylitis. No acute osseous pathology. CT ABDOMEN PELVIS FINDINGS No intra-abdominal free air or free fluid. Hepatobiliary: No focal liver abnormality is seen. No gallstones, gallbladder wall thickening, or biliary dilatation. Pancreas: Unremarkable. No pancreatic ductal dilatation or surrounding inflammatory changes. Spleen: Normal in size without focal abnormality. Adrenals/Urinary Tract: The adrenal glands are unremarkable. Similar position of bilateral nephrostomy tubes. There is no hydronephrosis on either side. There is high attenuating content in the right renal collecting system and pelvis extending down in the  right ureter suspicious for blood product. Correlation with urinalysis recommended. There is no hydronephrosis on either side. The urinary bladder is collapsed. Stomach/Bowel: There is extensive sigmoid diverticulosis and scattered colonic diverticula without active inflammatory changes. No bowel obstruction or active inflammation. No evidence of acute appendicitis. Vascular/Lymphatic: Advanced aortoiliac atherosclerotic disease. Infrarenal abdominal aortic aneurysm measures up to 5.5 cm in greatest diameter. This is essentially unchanged compared to the prior CT by my measurements. Bilateral common iliac artery aneurysms measure 2.6 cm on the right and 2.4 cm on the left. Evaluation of the vasculature is limited in the absence of intravenous contrast. The IVC is unremarkable. No portal venous gas. There is no adenopathy. Reproductive: Prostate brachytherapy seeds. Other: None Musculoskeletal: Osteopenia with degenerative changes of the spine. No acute osseous pathology. IMPRESSION: 1. Interval development of bilateral lower lobe predominant airspace densities most consistent with pneumonia and possible aspiration. Clinical correlation is recommended. Small right pleural effusion. 2. High attenuating content in the right renal collecting system and right ureter suspicious for blood product. Correlation with urinalysis recommended. No hydronephrosis on either side. 3. Stable position of bilateral nephrostomy tubes. 4. Extensive sigmoid and colonic diverticulosis without active inflammatory changes. No bowel obstruction. 5. Advanced aortoiliac atherosclerotic disease with a 5.5 cm infrarenal abdominal aortic aneurysm and bilateral common iliac artery aneurysms. Recommend followup by abdomen and pelvis CTA in 3-6 months, and vascular surgery referral/consultation if not already obtained. This recommendation follows ACR consensus guidelines: White Paper of the ACR Incidental Findings Committee II on Vascular Findings. J  Am Coll Radiol 2013; 10:789-794. Electronically Signed   By: Anner Crete M.D.   On: 01/03/2019 00:27   Dg Chest Portable 1 View  Result Date: 01/02/2019 CLINICAL DATA:  85 year old male with cough. EXAM: PORTABLE CHEST 1 VIEW COMPARISON:  Chest radiograph dated 12/25/2018 FINDINGS: Right-sided PICC with tip over the mediastinum at the level of the aortic arch. There is diffuse chronic interstitial coarsening. Bibasilar linear atelectasis/scarring noted. There has been interval clearing of the previously seen bibasilar densities. No focal consolidation, pleural effusion, or pneumothorax. Stable cardiac silhouette. Left pectoral pacemaker device. Atherosclerotic calcification of the aorta. Osteopenia with degenerative changes of the spine. No acute osseous pathology. IMPRESSION: 1. No acute cardiopulmonary process. 2. Right-sided PICC with tip over the mediastinum at the level of the aortic arch. Electronically Signed   By: Anner Crete M.D.   On: 01/02/2019 20:41   Dg Nephrostogram Right Thru Existing Access  Result Date: 01/02/2019 INDICATION: Bilateral nephrostomies, right nephrostomy bloody output EXAM: RIGHT NEPHROSTOGRAM THROUGH EXISTING NEPHROSTOMY COMPARISON:  12/29/2018, 01/01/2019 MEDICATIONS: None. ANESTHESIA/SEDATION: Moderate  Sedation Time:  None. The patient was continuously monitored during the procedure by the interventional radiology nurse under my direct supervision. CONTRAST:  20 cc dilute Omnipaque 300-administered into the collecting system(s) FLUOROSCOPY TIME:  Fluoroscopy Time: 0 minutes 24 seconds (9 mGy). COMPLICATIONS: None immediate. PROCEDURE: Informed written consent was obtained from the patient after a thorough discussion of the procedural risks, benefits and alternatives. All questions were addressed. Maximal Sterile Barrier Technique was utilized including caps, mask, sterile gowns, sterile gloves, sterile drape, hand hygiene and skin antiseptic. A timeout was  performed prior to the initiation of the procedure. Under sterile conditions, the existing right nephrostomy was injected with contrast. Fluoroscopic imaging performed. Nephrostomy retention loop in the renal pelvis in good position. Mild hydronephrosis noted. No evidence of contrast extravasation or communication to any vascular source. No obvious site of bleeding. IMPRESSION: Right nephrostomy in good position. No signs of active bleeding or contrast extravasation. Electronically Signed   By: Jerilynn Mages.  Shick M.D.   On: 01/02/2019 15:15     Assessment/Plan:  85 y.o. male with a known history of AAA, CHF, DM, HTN, PPM, HLD who was discharged yesterday after being admitted here for Pseudomonas bacteremia with urinary obstruction status post bilateral nephrostomy and PICC line placement for which he has been getting IV cefepime.  He had a seizure episode after going home and this was new onset for him.  Upon arrival of EMS he had another seizure episode and has been postictal since then till arrival to the ER.  He denied any headache or dizziness or blurred vision, paresthesias or focal muscle weakness.  No nausea vomiting or abdominal pain.  No chest pain or dyspnea or palpitations.  No cough or wheezing or hemoptysis. Appears at baseline right now.    - Seizures likely provoked in setting of PNA and UTI and Cefepime use - EEG with slowing but no seizures  - don't think needs anti epileptics now given the fact that I believe this is provoked as above and no hx of seizures   - s/p discussion with Lazarus Salines over the phone yesterday and explained the current condition. - CTH no acute abnormalities.   - off cefepime now and meropenem.   Leotis Pain  01/04/2019, 11:01 AM

## 2019-01-05 ENCOUNTER — Other Ambulatory Visit (INDEPENDENT_AMBULATORY_CARE_PROVIDER_SITE_OTHER): Payer: Medicare Other

## 2019-01-05 ENCOUNTER — Ambulatory Visit (INDEPENDENT_AMBULATORY_CARE_PROVIDER_SITE_OTHER): Payer: Medicare Other | Admitting: Nurse Practitioner

## 2019-01-05 LAB — BASIC METABOLIC PANEL
Anion gap: 12 (ref 5–15)
BUN: 74 mg/dL — ABNORMAL HIGH (ref 8–23)
CO2: 21 mmol/L — ABNORMAL LOW (ref 22–32)
Calcium: 8.2 mg/dL — ABNORMAL LOW (ref 8.9–10.3)
Chloride: 104 mmol/L (ref 98–111)
Creatinine, Ser: 5.29 mg/dL — ABNORMAL HIGH (ref 0.61–1.24)
GFR calc Af Amer: 10 mL/min — ABNORMAL LOW (ref >60)
GFR calc non Af Amer: 9 mL/min — ABNORMAL LOW (ref >60)
Glucose, Bld: 162 mg/dL — ABNORMAL HIGH (ref 70–99)
Potassium: 4 mmol/L (ref 3.5–5.1)
Sodium: 137 mmol/L (ref 135–145)

## 2019-01-05 LAB — GLUCOSE, CAPILLARY
Glucose-Capillary: 151 mg/dL — ABNORMAL HIGH (ref 70–99)
Glucose-Capillary: 165 mg/dL — ABNORMAL HIGH (ref 70–99)
Glucose-Capillary: 160 mg/dL — ABNORMAL HIGH (ref 70–99)
Glucose-Capillary: 141 mg/dL — ABNORMAL HIGH (ref 70–99)

## 2019-01-05 LAB — BPAM RBC
Blood Product Expiration Date: 202007212359
ISSUE DATE / TIME: 202006251714
Unit Type and Rh: 7300

## 2019-01-05 LAB — TYPE AND SCREEN
ABO/RH(D): B POS
Antibody Screen: NEGATIVE
Unit division: 0

## 2019-01-05 LAB — CBC
HCT: 23.5 % — ABNORMAL LOW (ref 39.0–52.0)
Hemoglobin: 7.3 g/dL — ABNORMAL LOW (ref 13.0–17.0)
MCH: 27 pg (ref 26.0–34.0)
MCHC: 31.1 g/dL (ref 30.0–36.0)
MCV: 87 fL (ref 80.0–100.0)
Platelets: 106 10*3/uL — ABNORMAL LOW (ref 150–400)
RBC: 2.7 MIL/uL — ABNORMAL LOW (ref 4.22–5.81)
RDW: 16 % — ABNORMAL HIGH (ref 11.5–15.5)
WBC: 16.4 10*3/uL — ABNORMAL HIGH (ref 4.0–10.5)
nRBC: 0 % (ref 0.0–0.2)

## 2019-01-05 MED ORDER — SODIUM CHLORIDE 0.9 % IV SOLN
INTRAVENOUS | Status: AC
  Administered 2019-01-05: 11:00:00 via INTRAVENOUS

## 2019-01-05 MED ORDER — SODIUM CHLORIDE 0.9 % IV SOLN
500.0000 mg | INTRAVENOUS | Status: DC
  Administered 2019-01-06 – 2019-01-08 (×3): 500 mg via INTRAVENOUS
  Filled 2019-01-05: qty 0.5
  Filled 2019-01-05 (×3): qty 500

## 2019-01-05 NOTE — Progress Notes (Addendum)
Patient ID: Blake [name], male   DOB: 13-Oct-1922, 85 y.o.   MRN: [id number] Holding BP meds given low bp and lethargy. Aspiration precautions. Patient intermittently keeps coughing.  ACP note Met with patient's son in the room along with grandson and daughter on the phone conference call with Dr. Candiss Norse and myself in the room discussed Mr. Schmid overall condition and overall long-term poor prognosis. Patient remains a full code.   Discussed with Josh care manager to work with family for home health care.  Time 16 mins

## 2019-01-05 NOTE — Progress Notes (Signed)
[facility]  ROUNDING NOTE   Subjective:   Patient is readmitted because he had a seizure soon after he got home.   06/25 0701 - 06/26 0700 In: 0998 [P.O.:877; I.V.:350; Blood:286; IV Piggyback:200] Out: 950 [Urine:950] Lethargic Now getting iv meropenem for pseudomonas bactermia   Objective:  Vital signs in last 24 hours:  Temp:  [98.7 F (37.1 C)-100.4 F (38 C)] 98.7 F (37.1 C) (06/26 0900) Pulse Rate:  [73-107] 100 (06/26 0900) Resp:  [18-20] 20 (06/26 0900) BP: (105-160)/(68-147) 119/89 (06/26 0900) SpO2:  [90 %-99 %] 97 % (06/26 0900)  Weight change:  Filed Weights   01/03/19 1222  Weight: 91.8 kg    Intake/Output: I/O last 3 completed shifts: In: 1713 [P.O.:877; I.V.:350; Blood:286; IV Piggyback:200] Out: 1250 [Urine:1250]   Intake/Output this shift:  Total I/O In: 330 [P.O.:20; Other:310] Out: -   Physical Exam: General: NAD, frail, elderly  Head: Dry oral mucous membranes  Eyes: Anicteric,   Neck: Supple,    Lungs:  Clear to auscultation  Heart: No rub  Abdomen:   Soft, nontender  Extremities:  trace  peripheral edema.  Neurologic: Lethargic, did not answer questions, mumbling  Skin: No lesions   GU Bilateral percutaneous nephrostomy tube, clear on the left, dark on the right    Basic Metabolic Panel: Recent Labs  Lab 12/31/18 1009 01/02/19 1523 01/02/19 2014 01/03/19 0444 01/04/19 0720 01/05/19 0515  NA 142 135 138 139  --  137  K 4.4 3.3* 3.2* 4.3  --  4.0  CL 102 99 100 103  --  104  CO2 27 25 22 22   --  21*  GLUCOSE 155* 157* 190* 203*  --  162*  BUN 76* 77* 76* 71*  --  74*  CREATININE 4.98* 4.85* 4.95* 4.85* 5.03* 5.29*  CALCIUM 8.8* 8.3* 8.6* 8.4*  --  8.2*  MG  --   --   --  1.7  --   --   PHOS 5.0*  --   --   --   --   --     Liver Function Tests: Recent Labs  Lab 12/31/18 1009 01/02/19 2014  AST  --  25  ALT  --  20  ALKPHOS  --  63  BILITOT  --  0.3  PROT  --  8.2*  ALBUMIN 2.9* 3.1*   Recent  Labs  Lab 01/02/19 2014  LIPASE 47   No results for input(s): AMMONIA in the last 168 hours.  CBC: Recent Labs  Lab 12/30/18 0655 01/01/19 1449 01/02/19 2014 01/03/19 0444 01/04/19 0720 01/05/19 0515  WBC 10.0  --  9.3 20.4* 18.6* 16.4*  NEUTROABS  --   --  6.7  --   --   --   HGB 9.0* 8.3* 7.7* 7.9* 7.3* 7.3*  HCT 29.6*  --  25.4* 25.9* 24.2* 23.5*  MCV 88.9  --  86.7 87.5 89.0 87.0  PLT 125*  --  144* 136* 119* 106*    Cardiac Enzymes: No results for input(s): CKTOTAL, CKMB, CKMBINDEX, TROPONINI in the last 168 hours.  BNP: Invalid input(s): POCBNP  CBG: Recent Labs  Lab 01/04/19 1207 01/04/19 1634 01/04/19 2122 01/05/19 0748 01/05/19 1115  GLUCAP 133* 200* 199* 151* 165*    Microbiology: Results for orders placed or performed during the hospital encounter of 01/02/19  Blood Culture (routine x 2)     Status: None (Preliminary result)   Collection Time: 01/02/19  8:07 PM  Specimen: BLOOD  Result Value Ref Range Status   Specimen Description BLOOD BLOOD RIGHT HAND  Final   Special Requests   Final    BOTTLES DRAWN AEROBIC AND ANAEROBIC Blood Culture results may not be optimal due to an excessive volume of blood received in culture bottles   Culture   Final    NO GROWTH 3 DAYS Performed at [facility], [address] [place], [place] [address]    Report Status PENDING  Incomplete  Blood Culture (routine x 2)     Status: None (Preliminary result)   Collection Time: 01/02/19  8:12 PM   Specimen: BLOOD  Result Value Ref Range Status   Specimen Description BLOOD BLOOD LEFT HAND  Final   Special Requests   Final    BOTTLES DRAWN AEROBIC AND ANAEROBIC Blood Culture adequate volume   Culture   Final    NO GROWTH 3 DAYS Performed at [facility], [address], [place], [place] [address]    Report Status PENDING  Incomplete  SARS Coronavirus 2 (CEPHEID- Performed in [place] hospital lab), Hosp Order     Status: None   Collection  Time: 01/02/19  8:52 PM   Specimen: Nasopharyngeal Swab  Result Value Ref Range Status   SARS Coronavirus 2 NEGATIVE NEGATIVE Final    Comment: (NOTE) If result is NEGATIVE SARS-CoV-2 target nucleic acids are NOT DETECTED. The SARS-CoV-2 RNA is generally detectable in upper and lower  respiratory specimens during the acute phase of infection. The lowest  concentration of SARS-CoV-2 viral copies this assay can detect is 250  copies / mL. A negative result does not preclude SARS-CoV-2 infection  and should not be used as the sole basis for treatment or other  patient management decisions.  A negative result may occur with  improper specimen collection / handling, submission of specimen other  than nasopharyngeal swab, presence of viral mutation(s) within the  areas targeted by this assay, and inadequate number of viral copies  (<250 copies / mL). A negative result must be combined with clinical  observations, patient history, and epidemiological information. If result is POSITIVE SARS-CoV-2 target nucleic acids are DETECTED. The SARS-CoV-2 RNA is generally detectable in upper and lower  respiratory specimens dur ing the acute phase of infection.  Positive  results are indicative of active infection with SARS-CoV-2.  Clinical  correlation with patient history and other diagnostic information is  necessary to determine patient infection status.  Positive results do  not rule out bacterial infection or co-infection with other viruses. If result is PRESUMPTIVE POSTIVE SARS-CoV-2 nucleic acids MAY BE PRESENT.   A presumptive positive result was obtained on the submitted specimen  and confirmed on repeat testing.  While 2019 novel coronavirus  (SARS-CoV-2) nucleic acids may be present in the submitted sample  additional confirmatory testing may be necessary for epidemiological  and / or clinical management purposes  to differentiate between  SARS-CoV-2 and other Sarbecovirus currently known  to infect humans.  If clinically indicated additional testing with an alternate test  methodology [phone]) is advised. The SARS-CoV-2 RNA is generally  detectable in upper and lower respiratory sp ecimens during the acute  phase of infection. The expected result is Negative. Fact Sheet for Patients:  StrictlyIdeas.no Fact Sheet for Healthcare Providers: BankingDealers.co.za This test is not yet approved or cleared by the [place] FDA and has been authorized for detection and/or diagnosis of SARS-CoV-2 by FDA under an Emergency Use Authorization (EUA).  This  EUA will remain in effect (meaning this test can be used) for the duration of the COVID-19 declaration under Section 564(b)(1) of the Act, 21 U.S.C. section 360bbb-3(b)(1), unless the authorization is terminated or revoked sooner. Performed at [facility], [address] [place], [place] [address]   Urine culture     Status: None   Collection Time: 01/02/19  8:52 PM   Specimen: Urine, Random  Result Value Ref Range Status   Specimen Description   Final    URINE, RANDOM Performed at [facility], [address], [place], [place] [address]    Special Requests   Final    NONE Performed at [facility], [address] [place], [place] [address]    Culture   Final    NO GROWTH Performed at [facility], [place] [address] [place], [place] [address]    Report Status 01/03/2019 FINAL  Final  MRSA PCR Screening     Status: None   Collection Time: 01/03/19  8:59 PM   Specimen: Nasal Mucosa; Nasopharyngeal  Result Value Ref Range Status   MRSA by PCR NEGATIVE NEGATIVE Final    Comment:        The GeneXpert MRSA Assay (FDA approved for NASAL specimens only), is one component of a comprehensive MRSA colonization surveillance program. It is not intended to diagnose MRSA infection nor to guide or monitor treatment for MRSA  infections. Performed at [facility], [address] [place], [place] [address]     Coagulation Studies: Recent Labs    01/02/19 10-02-12  LABPROT 15.8*  INR 1.3*    Urinalysis: Recent Labs    01/02/19 2050-10-03  COLORURINE YELLOW*  LABSPEC 1.014  PHURINE 5.0  GLUCOSEU 50*  HGBUR LARGE*  BILIRUBINUR NEGATIVE  KETONESUR NEGATIVE  PROTEINUR 100*  NITRITE NEGATIVE  LEUKOCYTESUR MODERATE*      Imaging: No results found.   Medications:   . sodium chloride 100 mL/hr at 01/05/19 1109  . meropenem (MERREM) IV 500 mg (01/05/19 1013)   . amLODipine  2.5 mg Oral Daily  . aspirin EC  81 mg Oral Daily  . feeding supplement (GLUCERNA SHAKE)  237 mL Oral BID BM  . ferrous sulfate  325 mg Oral Daily  . finasteride  5 mg Oral Daily  . insulin aspart  0-9 Units Subcutaneous TID WC  . linagliptin  5 mg Oral Daily  . metoprolol tartrate  37.5 mg Oral BID  . multivitamin-lutein   Oral BID  . pantoprazole  40 mg Oral Daily  . rosuvastatin  5 mg Oral QPM  . sodium bicarbonate  650 mg Oral BID  . tamsulosin  0.4 mg Oral Daily   acetaminophen **OR** acetaminophen, LORazepam, magnesium hydroxide, ondansetron **OR** ondansetron (ZOFRAN) IV  Assessment/ Plan:  Mr. BERN FARE is a 85 y.o. black male with high-grade bladder cancer, incompletely resected tumor, severe bilateral hydronephrosis, history of prostate cancer, recurrent UTIs, atrial fibrillation, pacemaker/defibrillator, diabetes mellitus type II who is admitted to [facility] on 01/02/2019 for bilateral hydronephrosis  1. Acute renal failure with obstructive uropathy, bilateral hydronephrosis. On Chronic kidney disease stage IV.  Baseline creatinine 2.22, GFR of 28; 09/13/2018 Status post left percutaneous nephrostomy tube placement by IR on 6/16. Right placed on on 6/19.  -Right nephrostomy is blood-tinged.  Nephrostogram done on June 23 shows the tube is in the correct position Serum creatinine staying elevated at 5.3 We  will repeat maintenance IV fluids as patient appears to be  clinically dry  2.  Anemia of chronic kidney disease:  Lab Results  Component Value Date   HGB 7.3 (L) 01/05/2019  not candidate for Procrit due to malignancy  3. Pseudomonas sepsis from urinary source 6/15 Iv meropenem till 01/19/19 as recommended by ID    LOS: 2 Olena Willy Candiss Norse 6/26/202012:52 PM

## 2019-01-05 NOTE — Progress Notes (Signed)
New referral for outpatient Palliative to follow at home received from [facility]. Patient information faxed to referral. Flo Shanks BSN RN, [place] [phone]

## 2019-01-05 NOTE — Care Management Important Message (Signed)
Important Message  Patient Details  Name: Blake [name] MRN: [id number] Date of Birth: Nov 25, 1922   Medicare Important Message Given:  Yes     Juliann Pulse A Aicia Babinski 01/05/2019, 11:19 AM

## 2019-01-05 NOTE — Progress Notes (Signed)
PT Cancellation Note  Patient Details Name: ATILANO COVELLI MRN: [id number] DOB: [birth year]   Cancelled Treatment:    Reason Eval/Treat Not Completed: Fatigue/lethargy limiting ability to participate Spoke with nursing before attempting and she reports pt has been very lethargic even while they were changing him earlier today. Son in room and confirms that he has been very sleepy, did wake minimally to eat lunch.  PT attempted to wake pt multiple times with occasional eyes opening and mumbles but unable to engage pt.  After multiple attempts son states that he does not think he is going to wake this afternoon and suggests to try back at later date and let him rest at this time.    Kreg Shropshire, DPT 01/05/2019, 3:53 PM

## 2019-01-05 NOTE — Progress Notes (Signed)
[facility] [place] NAME: Blake [name]    MR#:  [id number]  DATE OF BIRTH:  1923-04-21  SUBJECTIVE:   Patient admitted with possible seizure like activity when he went home.No seizure like activity noted here.  appears very weak,  UOP a bit low, deconditioned. Pt tells me " I am still here" REVIEW OF SYSTEMS:   Review of Systems  Unable to perform ROS: Medical condition    DRUG ALLERGIES:   Allergies  Allergen Reactions  . Diovan [Valsartan] Cough  . Gabapentin Other (See Comments)    Sedation at all doses  . Hydralazine Itching  . Lisinopril Cough  . Pregabalin Other (See Comments)    Sedation at all doses  . Levofloxacin Other (See Comments)    Too many side effects. Nausea, vomiting, upset stomach, increased confusion, etc Other reaction(s): Confusion Nausea, chills Nausea, chills   . Sulfamethoxazole-Trimethoprim Other (See Comments)    Too many side effects. Nausea, vomiting, upset stomach, increased confusion, etc    VITALS:  Blood pressure 119/89, pulse 100, temperature 98.7 F (37.1 C), temperature source Oral, resp. rate 20, height 6\' 2"  (1.88 m), weight 91.8 kg, SpO2 97 %.  PHYSICAL EXAMINATION:   Physical Exam  GENERAL:  85 y.o.-year-old patient lying in the bed with no acute distress. Chronically ill, pallor+ very weak and deconditioned EYES: Pupils equal, round, reactive to light and accommodation. No scleral icterus. Extraocular muscles intact. Pallor+ HEENT: Head atraumatic, normocephalic. Oropharynx and nasopharynx clear.  NECK:  Supple, no jugular venous distention. No thyroid enlargement, no tenderness.  LUNGS: Normal breath sounds bilaterally, no wheezing, rales, rhonchi. No use of accessory muscles of respiration.  CARDIOVASCULAR: S1, S2 normal. No murmurs, rubs, or gallops.  ABDOMEN: Soft, nontender, nondistended. Bowel sounds present. No organomegaly or mass. Bilateral nephrostomy tubes  present EXTREMITIES: No cyanosis, clubbing or edema b/l.   PICC line + NEUROLOGIC: Cranial nerves II through XII are intact. No focal Motor or sensory deficits b/l. Moves all extremities well severe deconditioned PSYCHIATRIC:  patient is alert but appears a bit confused SKIN: No obvious rash, lesion, or ulcer.   LABORATORY PANEL:  CBC Recent Labs  Lab 01/05/19 0515  WBC 16.4*  HGB 7.3*  HCT 23.5*  PLT 106*    Chemistries  Recent Labs  Lab 01/02/19 2014 01/03/19 0444  01/05/19 0515  NA 138 139  --  137  K 3.2* 4.3  --  4.0  CL 100 103  --  104  CO2 22 22  --  21*  GLUCOSE 190* 203*  --  162*  BUN 76* 71*  --  74*  CREATININE 4.95* 4.85*   < > 5.29*  CALCIUM 8.6* 8.4*  --  8.2*  MG  --  1.7  --   --   AST 25  --   --   --   ALT 20  --   --   --   ALKPHOS 63  --   --   --   BILITOT 0.3  --   --   --    < > = values in this interval not displayed.   Cardiac Enzymes No results for input(s): TROPONINI in the last 168 hours. RADIOLOGY:  No results found. ASSESSMENT AND PLAN:  Blake [name] is an 85 y.o. male with a known history of AAA, CHF, DM, HTN, PPM, HLD who was discharged yesterday after being admitted here for Pseudomonas bacteremia with urinary  obstruction status post bilateral nephrostomy and PICC line placement for which he has been getting IV cefepime. He had a seizure episode after going home and this was new onset for him.  1.  Seizure of new onset. - cont  seizure precautions.  -  PRN IV Ativan.   - neurology consult  by Dr. Irish Elders-- Hold off antiseizure meds since this is provoked with recent sepsis and on cefepime (lowers seizure threshold)  2.  Bilateral lower lobe pneumonia, likely aspiration.     -on IV meropenem  3.  Acute kidney injury superimposed on chronic kidney disease-IV due to obstruction from bilateral hydroureteronephrosis with history of bladder cancer, status post bilateral nephrostomy tubes.   -We will hydrate him with IV normal  saline -per Dr Candiss Norse -creatinine worsened today at 5.93 (baseline 2.22) - Left nephrostomy tube UOP 500 cc this am. Poor outpt on the right  4. Essential Hypertension.  -holding Norvasc and metoprolol.--BP low and pt very sleepy  5. DM Type II.   -on supplemental coverage with NovoLog and follow fingerstick blood glucose.   - on  Januvia.  6. Acute on chronic anemia secondary to multiple medical reasons including nephrectomy tube bleeding. -Workup done cannot find source of bleeding. -Transfuse as needed. Hemoglobin 7.9--7.3--BT 1 unit--7.3 -spoke with patient's daughter Blake [name] and she gave permission for blood transfusion. Risk and complications discussed  7. Hyperlipidemia.  Resume Crestor.   8. BPH.  Resume Flomax and finasteride  9.  DVT prophylaxis.   avoid antiplatelet secondary to anemia and losing in nephrectomy tube. Will use SCD  Physical therapy to be initiated however currently pt is very deconditioned and overall declining.Marland Kitchen He carries a poor prognosis.consider Palliative care consult  Dr. Candiss Norse plans to call daughter dr Primus Bravo today.  CODE STATUS: full    TOTAL TIME TAKING CARE OF THIS PATIENT: *30 minutes.  >50% time spent on counselling and coordination of care    Note: This dictation was prepared with Dragon dictation along with smaller phrase technology. Any transcriptional errors that result from this process are unintentional.  Fritzi Mandes M.D on 01/05/2019 at 1:20 PM  Between 7am to 6pm - Pager - [phone]  After 6pm go to [email or web address] - password [email or web address] [facility]  Office  [phone]  CC: Primary care physician; Blake [name], MDPatient ID: Blake [name], male   DOB: Sep 23, 1922, 85 y.o.   MRN: [id number]

## 2019-01-05 NOTE — Progress Notes (Signed)
Pharmacy Antibiotic Note  Blake [name] is a 85 y.o. male admitted on 01/02/2019 with pneumonia, bacteremia and UTI.  Pharmacy has been consulted for Meropenem dosing. Patient admitted for new onset seizure possibly due to Cefepime.  Patient was recently discharged on IV Cefepime for Pseudomonal bacteremia and UTI.  Plan: This is day 3 of Meropenem 500mg  IV q12h, renal function continues to decline. Will decrease Meropenem to 500mg  IV q24h for estimated CrCl 9.9.  Height: 6\' 2"  (188 cm) Weight: 202 lb 6.1 oz (91.8 kg) IBW/kg (Calculated) : 82.2  Temp (24hrs), Avg:99.3 F (37.4 C), Min:98.7 F (37.1 C), Max:100.4 F (38 C)  Recent Labs  Lab 12/30/18 0655  01/02/19 1523 01/02/19 2004 01/02/19 2014 01/02/19 2228 01/03/19 0444 01/04/19 0720 01/05/19 0515  WBC 10.0  --   --   --  9.3  --  20.4* 18.6* 16.4*  CREATININE 5.07*   < > 4.85*  --  4.95*  --  4.85* 5.03* 5.29*  LATICACIDVEN  --   --   --  3.0*  --  1.4  --   --   --    < > = values in this interval not displayed.    Estimated Creatinine Clearance: 9.9 mL/min (A) (by C-G formula based on SCr of 5.29 mg/dL (H)).    Allergies  Allergen Reactions  . Diovan [Valsartan] Cough  . Gabapentin Other (See Comments)    Sedation at all doses  . Hydralazine Itching  . Lisinopril Cough  . Pregabalin Other (See Comments)    Sedation at all doses  . Levofloxacin Other (See Comments)    Too many side effects. Nausea, vomiting, upset stomach, increased confusion, etc Other reaction(s): Confusion Nausea, chills Nausea, chills   . Sulfamethoxazole-Trimethoprim Other (See Comments)    Too many side effects. Nausea, vomiting, upset stomach, increased confusion, etc    Antimicrobials this admission: Cefepime 6/23 >> 6/24 Vancomycin 6/23 >> 6/24 Metronidazole 6/23 >> 6/24 Meropenem 6/24 >>  Microbiology results: 6/23 BCx: pending 6/23 UCx: pending   Thank you for allowing pharmacy to be a part of this patient's  care.  Paulina Fusi, PharmD, BCPS 01/05/2019 1:56 PM

## 2019-01-05 NOTE — Progress Notes (Signed)
Patient ID: Blake [name], male   DOB: 02/04/1923, 85 y.o.   MRN: [id number]  Dr. Candiss Norse and I had a telephone conversation with Dr. Primus Bravo patient's daughter. We discussed patient's overall poor prognosis and he is deteriorating slowly. Daughter asked if there were any other options remaining. We explained to her that patient has multitude of problems including his pseudomonas sepsis, bladder cancer, worsening kidney numbers along with anemia. He is very deconditioned. Discussed about option of palliative care/hospice.  Daughter wants to discuss with her son who is a physician and her brother.  Review address the code status. She wants him to be a full code for now till she gets back with [place] after discussing with family.  Time spent 15 minutes

## 2019-01-05 NOTE — Progress Notes (Signed)
   Date of Admission:  01/02/2019        Subjective: Lethargic   Medications:  . amLODipine  2.5 mg Oral Daily  . aspirin EC  81 mg Oral Daily  . feeding supplement (GLUCERNA SHAKE)  237 mL Oral BID BM  . ferrous sulfate  325 mg Oral Daily  . finasteride  5 mg Oral Daily  . insulin aspart  0-9 Units Subcutaneous TID WC  . linagliptin  5 mg Oral Daily  . metoprolol tartrate  37.5 mg Oral BID  . multivitamin with minerals  1 tablet Oral Daily  . multivitamin-lutein   Oral BID  . pantoprazole  40 mg Oral Daily  . rosuvastatin  5 mg Oral QPM  . sodium bicarbonate  650 mg Oral BID  . tamsulosin  0.4 mg Oral Daily    Objective: Vital signs in last 24 hours: Temp:  [98.7 F (37.1 C)-100.4 F (38 C)] 98.7 F (37.1 C) (06/26 0900) Pulse Rate:  [73-107] 100 (06/26 0900) Resp:  [18-20] 20 (06/26 0900) BP: (105-160)/(68-147) 119/89 (06/26 0900) SpO2:  [90 %-99 %] 97 % (06/26 0900)  PHYSICAL EXAM:  General: lethargic, responds to questions but slow Lungs: b/l air entry  Heart: s1s2 Abdomen:rt nephrostomy tube- bloody dfluid Left nephrostomy tube clear urine Extremities: atraumatic, no cyanosis. No edema. No clubbing  normal. Neurologic: Grossly non-focal  Lab Results Recent Labs    01/03/19 0444 01/04/19 0720 01/05/19 0515  WBC 20.4* 18.6* 16.4*  HGB 7.9* 7.3* 7.3*  HCT 25.9* 24.2* 23.5*  NA 139  --  137  K 4.3  --  4.0  CL 103  --  104  CO2 22  --  21*  BUN 71*  --  74*  CREATININE 4.85* 5.03* 5.29*   Liver Panel Recent Labs    01/02/19 2014  PROT 8.2*  ALBUMIN 3.1*  AST 25  ALT 20  ALKPHOS 63  BILITOT 0.3   Sedimentation Rate No results for input(s): ESRSEDRATE in the last 72 hours. C-Reactive Protein No results for input(s): CRP in the last 72 hours.  Microbiology: 12/13/18 BC NG Studies/Results: No results found.   Assessment/Plan:  Impression/recommendation New onset seizures- r/o cefepime induced  Pseudomonas bacteremia from  pseudomonas complicated UTi  B/l hydroureteronephrosis from obstructive uropathy- has b/l PCN- blood int he rt tube  On Meropenem which will be given until 01/19/19  AKI on CKD  Anemia- got PRBC  Bladder ca   Discussed with Dr.PAtel ID will follow him remotely this weekend

## 2019-01-06 LAB — CBC
HCT: 23.8 % — ABNORMAL LOW (ref 39.0–52.0)
Hemoglobin: 7.4 g/dL — ABNORMAL LOW (ref 13.0–17.0)
MCH: 26.8 pg (ref 26.0–34.0)
MCHC: 31.1 g/dL (ref 30.0–36.0)
MCV: 86.2 fL (ref 80.0–100.0)
Platelets: 117 10*3/uL — ABNORMAL LOW (ref 150–400)
RBC: 2.76 MIL/uL — ABNORMAL LOW (ref 4.22–5.81)
RDW: 16.1 % — ABNORMAL HIGH (ref 11.5–15.5)
WBC: 12.3 10*3/uL — ABNORMAL HIGH (ref 4.0–10.5)
nRBC: 0 % (ref 0.0–0.2)

## 2019-01-06 LAB — GLUCOSE, CAPILLARY
Glucose-Capillary: 149 mg/dL — ABNORMAL HIGH (ref 70–99)
Glucose-Capillary: 220 mg/dL — ABNORMAL HIGH (ref 70–99)
Glucose-Capillary: 170 mg/dL — ABNORMAL HIGH (ref 70–99)
Glucose-Capillary: 144 mg/dL — ABNORMAL HIGH (ref 70–99)

## 2019-01-06 LAB — BASIC METABOLIC PANEL
Anion gap: 10 (ref 5–15)
BUN: 68 mg/dL — ABNORMAL HIGH (ref 8–23)
CO2: 22 mmol/L (ref 22–32)
Calcium: 8.3 mg/dL — ABNORMAL LOW (ref 8.9–10.3)
Chloride: 108 mmol/L (ref 98–111)
Creatinine, Ser: 4.78 mg/dL — ABNORMAL HIGH (ref 0.61–1.24)
GFR calc Af Amer: 11 mL/min — ABNORMAL LOW (ref >60)
GFR calc non Af Amer: 10 mL/min — ABNORMAL LOW (ref >60)
Glucose, Bld: 149 mg/dL — ABNORMAL HIGH (ref 70–99)
Potassium: 4 mmol/L (ref 3.5–5.1)
Sodium: 140 mmol/L (ref 135–145)

## 2019-01-06 MED ORDER — SODIUM CHLORIDE 0.9 % IV SOLN
INTRAVENOUS | Status: AC
  Administered 2019-01-06: 12:00:00 via INTRAVENOUS

## 2019-01-06 NOTE — Progress Notes (Signed)
[facility]  ROUNDING NOTE   Subjective:   Patient is readmitted because he had a seizure soon after he got home.   06/26 0701 - 06/27 0700 In: 1111.3 [P.O.:20; I.V.:781.3] Out: 2310 [Urine:2310] More alert today.  Urine output 2300 cc. Oral intake still appears to be poor Continued on IV meropenem Has wet sounding cough   Objective:  Vital signs in last 24 hours:  Temp:  [98 F (36.7 C)-98.8 F (37.1 C)] 98.8 F (37.1 C) (06/27 0517) Pulse Rate:  [84-135] 110 (06/27 0533) Resp:  [14-20] 20 (06/27 0517) BP: (126-146)/(74-95) 127/74 (06/27 0517) SpO2:  [97 %-100 %] 97 % (06/27 0517)  Weight change:  Filed Weights   01/03/19 1222  Weight: 91.8 kg    Intake/Output: I/O last 3 completed shifts: In: 1827.3 [P.O.:20; I.V.:1111.3; Blood:286; Other:310; IV Piggyback:100] Out: 4696 [Urine:3260]   Intake/Output this shift:  Total I/O In: 487 [P.O.:237; Other:250] Out: -   Physical Exam: General: NAD, frail, elderly  Head: Dry oral mucous membranes  Eyes: Anicteric,   Neck: Supple,    Lungs:  Clear to auscultation  Heart: No rub  Abdomen:   Soft, nontender  Extremities:  trace  peripheral edema.  Neurologic: Lethargic, answers a few simple questions today  Skin: No lesions   GU Bilateral percutaneous nephrostomy tube, clear on the left, dark on the right    Basic Metabolic Panel: Recent Labs  Lab 12/31/18 1009 01/02/19 1523 01/02/19 2014 01/03/19 0444 01/04/19 0720 01/05/19 0515 01/06/19 0459  NA 142 135 138 139  --  137 140  K 4.4 3.3* 3.2* 4.3  --  4.0 4.0  CL 102 99 100 103  --  104 108  CO2 27 25 22 22   --  21* 22  GLUCOSE 155* 157* 190* 203*  --  162* 149*  BUN 76* 77* 76* 71*  --  74* 68*  CREATININE 4.98* 4.85* 4.95* 4.85* 5.03* 5.29* 4.78*  CALCIUM 8.8* 8.3* 8.6* 8.4*  --  8.2* 8.3*  MG  --   --   --  1.7  --   --   --   PHOS 5.0*  --   --   --   --   --   --     Liver Function Tests: Recent Labs  Lab 12/31/18 1009  01/02/19 2014  AST  --  25  ALT  --  20  ALKPHOS  --  63  BILITOT  --  0.3  PROT  --  8.2*  ALBUMIN 2.9* 3.1*   Recent Labs  Lab 01/02/19 2014  LIPASE 47   No results for input(s): AMMONIA in the last 168 hours.  CBC: Recent Labs  Lab 01/02/19 2014 01/03/19 0444 01/04/19 0720 01/05/19 0515 01/06/19 0459  WBC 9.3 20.4* 18.6* 16.4* 12.3*  NEUTROABS 6.7  --   --   --   --   HGB 7.7* 7.9* 7.3* 7.3* 7.4*  HCT 25.4* 25.9* 24.2* 23.5* 23.8*  MCV 86.7 87.5 89.0 87.0 86.2  PLT 144* 136* 119* 106* 117*    Cardiac Enzymes: No results for input(s): CKTOTAL, CKMB, CKMBINDEX, TROPONINI in the last 168 hours.  BNP: Invalid input(s): POCBNP  CBG: Recent Labs  Lab 01/05/19 0748 01/05/19 1115 01/05/19 1726 01/05/19 2046 01/06/19 0742  GLUCAP 151* 165* 160* 141* 149*    Microbiology: Results for orders placed or performed during the hospital encounter of 01/02/19  Blood Culture (routine x 2)     Status: None (  Preliminary result)   Collection Time: 01/02/19  8:07 PM   Specimen: BLOOD  Result Value Ref Range Status   Specimen Description BLOOD BLOOD RIGHT HAND  Final   Special Requests   Final    BOTTLES DRAWN AEROBIC AND ANAEROBIC Blood Culture results may not be optimal due to an excessive volume of blood received in culture bottles   Culture   Final    NO GROWTH 4 DAYS Performed at [facility], [address] [place], [place] [address]    Report Status PENDING  Incomplete  Blood Culture (routine x 2)     Status: None (Preliminary result)   Collection Time: 01/02/19  8:12 PM   Specimen: BLOOD  Result Value Ref Range Status   Specimen Description BLOOD BLOOD LEFT HAND  Final   Special Requests   Final    BOTTLES DRAWN AEROBIC AND ANAEROBIC Blood Culture adequate volume   Culture   Final    NO GROWTH 4 DAYS Performed at [facility], [address], [place], [place] [address]    Report Status PENDING  Incomplete  SARS Coronavirus 2 (CEPHEID-  Performed in [place] hospital lab), Hosp Order     Status: None   Collection Time: 01/02/19  8:52 PM   Specimen: Nasopharyngeal Swab  Result Value Ref Range Status   SARS Coronavirus 2 NEGATIVE NEGATIVE Final    Comment: (NOTE) If result is NEGATIVE SARS-CoV-2 target nucleic acids are NOT DETECTED. The SARS-CoV-2 RNA is generally detectable in upper and lower  respiratory specimens during the acute phase of infection. The lowest  concentration of SARS-CoV-2 viral copies this assay can detect is 250  copies / mL. A negative result does not preclude SARS-CoV-2 infection  and should not be used as the sole basis for treatment or other  patient management decisions.  A negative result may occur with  improper specimen collection / handling, submission of specimen other  than nasopharyngeal swab, presence of viral mutation(s) within the  areas targeted by this assay, and inadequate number of viral copies  (<250 copies / mL). A negative result must be combined with clinical  observations, patient history, and epidemiological information. If result is POSITIVE SARS-CoV-2 target nucleic acids are DETECTED. The SARS-CoV-2 RNA is generally detectable in upper and lower  respiratory specimens dur ing the acute phase of infection.  Positive  results are indicative of active infection with SARS-CoV-2.  Clinical  correlation with patient history and other diagnostic information is  necessary to determine patient infection status.  Positive results do  not rule out bacterial infection or co-infection with other viruses. If result is PRESUMPTIVE POSTIVE SARS-CoV-2 nucleic acids MAY BE PRESENT.   A presumptive positive result was obtained on the submitted specimen  and confirmed on repeat testing.  While 2019 novel coronavirus  (SARS-CoV-2) nucleic acids may be present in the submitted sample  additional confirmatory testing may be necessary for epidemiological  and / or clinical management  purposes  to differentiate between  SARS-CoV-2 and other Sarbecovirus currently known to infect humans.  If clinically indicated additional testing with an alternate test  methodology [phone]) is advised. The SARS-CoV-2 RNA is generally  detectable in upper and lower respiratory sp ecimens during the acute  phase of infection. The expected result is Negative. Fact Sheet for Patients:  StrictlyIdeas.no Fact Sheet for Healthcare Providers: BankingDealers.co.za This test is not yet approved or cleared by the [place] FDA and has been authorized for detection and/or diagnosis  of SARS-CoV-2 by FDA under an Emergency Use Authorization (EUA).  This EUA will remain in effect (meaning this test can be used) for the duration of the COVID-19 declaration under Section 564(b)(1) of the Act, 21 U.S.C. section 360bbb-3(b)(1), unless the authorization is terminated or revoked sooner. Performed at [facility], [address], [place], [place] [address]   Urine culture     Status: None   Collection Time: 01/02/19  8:52 PM   Specimen: Urine, Random  Result Value Ref Range Status   Specimen Description   Final    URINE, RANDOM Performed at [facility], [address], [place], [place] [address]    Special Requests   Final    NONE Performed at [facility], [address], [place], [place] [address]    Culture   Final    NO GROWTH Performed at [facility], [place] [address] [place], [place] [address]    Report Status 01/03/2019 FINAL  Final  MRSA PCR Screening     Status: None   Collection Time: 01/03/19  8:59 PM   Specimen: Nasal Mucosa; Nasopharyngeal  Result Value Ref Range Status   MRSA by PCR NEGATIVE NEGATIVE Final    Comment:        The GeneXpert MRSA Assay (FDA approved for NASAL specimens only), is one component of a comprehensive MRSA colonization surveillance program. It is not intended to  diagnose MRSA infection nor to guide or monitor treatment for MRSA infections. Performed at [facility], [address] [place], [place] [address]     Coagulation Studies: No results for input(s): LABPROT, INR in the last 72 hours.  Urinalysis: No results for input(s): COLORURINE, LABSPEC, PHURINE, GLUCOSEU, HGBUR, BILIRUBINUR, KETONESUR, PROTEINUR, UROBILINOGEN, NITRITE, LEUKOCYTESUR in the last 72 hours.  Invalid input(s): APPERANCEUR    Imaging: No results found.   Medications:   . meropenem (MERREM) IV 500 mg (01/06/19 0854)   . aspirin EC  81 mg Oral Daily  . feeding supplement (GLUCERNA SHAKE)  237 mL Oral BID BM  . ferrous sulfate  325 mg Oral Daily  . finasteride  5 mg Oral Daily  . insulin aspart  0-9 Units Subcutaneous TID WC  . linagliptin  5 mg Oral Daily  . multivitamin-lutein   Oral BID  . pantoprazole  40 mg Oral Daily  . rosuvastatin  5 mg Oral QPM  . sodium bicarbonate  650 mg Oral BID  . tamsulosin  0.4 mg Oral Daily   acetaminophen **OR** acetaminophen, LORazepam, magnesium hydroxide, ondansetron **OR** ondansetron (ZOFRAN) IV  Assessment/ Plan:  Mr. EWEL LONA is a 85 y.o. black male with high-grade bladder cancer, incompletely resected tumor, severe bilateral hydronephrosis, history of prostate cancer, recurrent UTIs, atrial fibrillation, pacemaker/defibrillator, diabetes mellitus type II who is admitted to [facility] on 01/02/2019 for bilateral hydronephrosis  1. Acute renal failure with obstructive uropathy, bilateral hydronephrosis. On Chronic kidney disease stage IV.  Baseline creatinine 2.22, GFR of 28; 09/13/2018 Status post left percutaneous nephrostomy tube placement by IR on 6/16. Right placed on on 6/19.  -Right nephrostomy is blood-tinged.  Nephrostogram done on June 23 shows the tube is in the correct position Serum creatinine improved today to 4.78. We will repeat maintenance IV fluids 75 cc/h x 12 hours as patient appears to be  clinically dry  2.  Anemia of chronic kidney disease:  Lab Results  Component Value Date   HGB 7.4 (L) 01/06/2019  not candidate for Procrit  due to malignancy  3. Pseudomonas sepsis from urinary source 6/15 Iv meropenem till 01/19/19 as recommended by ID Patient has a right arm PICC line   LOS: 3 Tashan Kreitzer 6/27/202010:08 AM

## 2019-01-06 NOTE — Progress Notes (Signed)
[place] [facility] [place] NAME: Blake [name]    MR#:  [id number]  DATE OF BIRTH:  1923-05-12  SUBJECTIVE:   Patient very weak   REVIEW OF SYSTEMS:   Review of Systems  Unable to perform ROS: Medical condition    DRUG ALLERGIES:   Allergies  Allergen Reactions  . Diovan [Valsartan] Cough  . Gabapentin Other (See Comments)    Sedation at all doses  . Hydralazine Itching  . Lisinopril Cough  . Pregabalin Other (See Comments)    Sedation at all doses  . Levofloxacin Other (See Comments)    Too many side effects. Nausea, vomiting, upset stomach, increased confusion, etc Other reaction(s): Confusion Nausea, chills Nausea, chills   . Sulfamethoxazole-Trimethoprim Other (See Comments)    Too many side effects. Nausea, vomiting, upset stomach, increased confusion, etc    VITALS:  Blood pressure 127/74, pulse (!) 110, temperature 98.8 F (37.1 C), resp. rate 20, height 6\' 2"  (1.88 m), weight 91.8 kg, SpO2 97 %.  PHYSICAL EXAMINATION:   Physical Exam  GENERAL:  85 y.o.-year-old patient lying in the bed with no acute distress. Chronically ill, pallor+ very weak and deconditioned EYES: Pupils equal, round, reactive to light and accommodation. No scleral icterus. Extraocular muscles intact. Pallor+ HEENT: Head atraumatic, normocephalic. Oropharynx and nasopharynx clear.  NECK:  Supple, no jugular venous distention. No thyroid enlargement, no tenderness.  LUNGS: Normal breath sounds bilaterally, no wheezing, rales, rhonchi. No use of accessory muscles of respiration.  CARDIOVASCULAR: S1, S2 normal. No murmurs, rubs, or gallops.  ABDOMEN: Soft, nontender, nondistended. Bowel sounds present. No organomegaly or mass. Bilateral nephrostomy tubes present EXTREMITIES: No cyanosis, clubbing or edema b/l.   PICC line + NEUROLOGIC: Cranial nerves II through XII are intact. No focal Motor or sensory deficits b/l. Moves all extremities well  severe deconditioned PSYCHIATRIC:  patient is alert but appears a bit confused SKIN: No obvious rash, lesion, or ulcer.   LABORATORY PANEL:  CBC Recent Labs  Lab 01/06/19 0459  WBC 12.3*  HGB 7.4*  HCT 23.8*  PLT 117*    Chemistries  Recent Labs  Lab 01/02/19 2014 01/03/19 0444  01/06/19 0459  NA 138 139   < > 140  K 3.2* 4.3   < > 4.0  CL 100 103   < > 108  CO2 22 22   < > 22  GLUCOSE 190* 203*   < > 149*  BUN 76* 71*   < > 68*  CREATININE 4.95* 4.85*   < > 4.78*  CALCIUM 8.6* 8.4*   < > 8.3*  MG  --  1.7  --   --   AST 25  --   --   --   ALT 20  --   --   --   ALKPHOS 63  --   --   --   BILITOT 0.3  --   --   --    < > = values in this interval not displayed.   Cardiac Enzymes No results for input(s): TROPONINI in the last 168 hours. RADIOLOGY:  No results found. ASSESSMENT AND PLAN:  Blake [name] is an 85 y.o. male with a known history of AAA, CHF, DM, HTN, PPM, HLD who was discharged yesterday after being admitted here for Pseudomonas bacteremia with urinary obstruction status post bilateral nephrostomy and PICC line placement for which he has been getting IV cefepime. He had a seizure episode after going  home and this was new onset for him.  1.  Seizure of new onset. - cont  seizure precautions.  -  PRN IV Ativan.   - neurology consult  by Dr. Irish Elders-- Hold off antiseizure meds since this is provoked with recent sepsis and on cefepime (lowers seizure threshold)  2.  Bilateral lower lobe pneumonia, likely aspiration.     -Continue on IV meropenem  3.  Acute kidney injury superimposed on chronic kidney disease-IV due to obstruction from bilateral hydroureteronephrosis with history of bladder cancer, status post bilateral nephrostomy tubes.   -Continue to IV hydrate -Renal functions improved since yesterday  4. Essential Hypertension.  -holding Norvasc and metoprolol due to low blood pressure  5. DM Type II.   -on supplemental coverage with  NovoLog and follow fingerstick blood glucose.   - on  Januvia.  6. Acute on chronic anemia secondary to multiple medical reasons including nephrectomy tube bleeding. -Workup done cannot find source of bleeding. -Transfuse as needed. Hemoglobin 7.9--7.3--BT 1 unit--7.3- 7.4  -Continue monitor CBC  7. Hyperlipidemia.  Resume Crestor.   8. BPH.  Resume Flomax and finasteride  9.  DVT prophylaxis.   avoid antiplatelet secondary to anemia and losing in nephrectomy tube. Will use SCD  Physical therapy to be initiated however currently pt is very deconditioned and overall declining.Marland Kitchen He carries a poor prognosis.consider Palliative care consult  Prognosis poor in this patient who has had recurrent admissions in the past few months  CODE STATUS: full    TOTAL TIME TAKING CARE OF THIS PATIENT: *30 minutes.  >50% time spent on counselling and coordination of care    Note: This dictation was prepared with Dragon dictation along with smaller phrase technology. Any transcriptional errors that result from this process are unintentional.  Dustin Flock M.D on 01/06/2019 at 2:14 PM  Between 7am to 6pm - Pager - [phone]  After 6pm go to [email or web address] - password [email or web address] [facility]  Office  [phone]  CC: Primary care physician; Adin Hector, MDPatient ID: Blake [name], male   DOB: 1922/10/01, 85 y.o.   MRN: [id number]

## 2019-01-07 LAB — GLUCOSE, CAPILLARY
Glucose-Capillary: 158 mg/dL — ABNORMAL HIGH (ref 70–99)
Glucose-Capillary: 150 mg/dL — ABNORMAL HIGH (ref 70–99)
Glucose-Capillary: 157 mg/dL — ABNORMAL HIGH (ref 70–99)
Glucose-Capillary: 200 mg/dL — ABNORMAL HIGH (ref 70–99)

## 2019-01-07 LAB — BASIC METABOLIC PANEL
Anion gap: 9 (ref 5–15)
BUN: 65 mg/dL — ABNORMAL HIGH (ref 8–23)
CO2: 23 mmol/L (ref 22–32)
Calcium: 8.4 mg/dL — ABNORMAL LOW (ref 8.9–10.3)
Chloride: 111 mmol/L (ref 98–111)
Creatinine, Ser: 4.37 mg/dL — ABNORMAL HIGH (ref 0.61–1.24)
GFR calc Af Amer: 12 mL/min — ABNORMAL LOW (ref >60)
GFR calc non Af Amer: 11 mL/min — ABNORMAL LOW (ref >60)
Glucose, Bld: 171 mg/dL — ABNORMAL HIGH (ref 70–99)
Potassium: 4.2 mmol/L (ref 3.5–5.1)
Sodium: 143 mmol/L (ref 135–145)

## 2019-01-07 LAB — CULTURE, BLOOD (ROUTINE X 2)
Culture: NO GROWTH
Culture: NO GROWTH
Special Requests: ADEQUATE

## 2019-01-07 MED ORDER — SODIUM CHLORIDE 0.9 % IV SOLN
INTRAVENOUS | Status: AC
  Administered 2019-01-07: 11:00:00 via INTRAVENOUS

## 2019-01-07 MED ORDER — METOPROLOL TARTRATE 25 MG PO TABS
37.5000 mg | ORAL_TABLET | Freq: Two times a day (BID) | ORAL | Status: DC
  Administered 2019-01-07 – 2019-01-09 (×5): 37.5 mg via ORAL
  Filled 2019-01-07: qty 2
  Filled 2019-01-07: qty 1.5
  Filled 2019-01-07 (×3): qty 2

## 2019-01-07 NOTE — Progress Notes (Signed)
[facility]  ROUNDING NOTE   Subjective:   Patient is readmitted because he had a seizure soon after he got home.   06/27 0701 - 06/28 0700 In: 1600 [P.O.:237; I.V.:213; IV Piggyback:100] Out: 600 [Urine:600] Very lethargic today.  Urine output low at 600 cc Serum creatinine is down to 4.4 Oral intake still appears to be poor Continued on IV meropenem   Objective:  Vital signs in last 24 hours:  Temp:  [98.3 F (36.8 C)-98.6 F (37 C)] 98.6 F (37 C) (06/28 0523) Pulse Rate:  [64-121] 121 (06/28 0523) Resp:  [15] 15 (06/27 2100) BP: (153-155)/(78-112) 154/97 (06/28 0523) SpO2:  [97 %-98 %] 98 % (06/28 0523)  Weight change:  Filed Weights   01/03/19 1222  Weight: 91.8 kg    Intake/Output: I/O last 3 completed shifts: In: 1600 [P.O.:237; I.V.:213; Other:1050; IV Piggyback:100] Out: 2150 [Urine:2150]   Intake/Output this shift:  Total I/O In: 237 [P.O.:237] Out: -   Physical Exam: General: NAD, frail, elderly  Head: Dry oral mucous membranes  Eyes: Anicteric,   Neck: Supple,    Lungs:  Clear to auscultation  Heart:  Irregular, tachycardic   Abdomen:   Soft, nontender  Extremities:  trace  peripheral edema.  Neurologic:  Very lethargic   GU Bilateral percutaneous nephrostomy tube,     Basic Metabolic Panel: Recent Labs  Lab 12/31/18 1009  01/02/19 2014 01/03/19 0444 01/04/19 0720 01/05/19 0515 01/06/19 0459 01/07/19 0525  NA 142   < > 138 139  --  137 140 143  K 4.4   < > 3.2* 4.3  --  4.0 4.0 4.2  CL 102   < > 100 103  --  104 108 111  CO2 27   < > 22 22  --  21* 22 23  GLUCOSE 155*   < > 190* 203*  --  162* 149* 171*  BUN 76*   < > 76* 71*  --  74* 68* 65*  CREATININE 4.98*   < > 4.95* 4.85* 5.03* 5.29* 4.78* 4.37*  CALCIUM 8.8*   < > 8.6* 8.4*  --  8.2* 8.3* 8.4*  MG  --   --   --  1.7  --   --   --   --   PHOS 5.0*  --   --   --   --   --   --   --    < > = values in this interval not displayed.    Liver Function  Tests: Recent Labs  Lab 12/31/18 1009 01/02/19 2014  AST  --  25  ALT  --  20  ALKPHOS  --  63  BILITOT  --  0.3  PROT  --  8.2*  ALBUMIN 2.9* 3.1*   Recent Labs  Lab 01/02/19 2014  LIPASE 47   No results for input(s): AMMONIA in the last 168 hours.  CBC: Recent Labs  Lab 01/02/19 2014 01/03/19 0444 01/04/19 0720 01/05/19 0515 01/06/19 0459  WBC 9.3 20.4* 18.6* 16.4* 12.3*  NEUTROABS 6.7  --   --   --   --   HGB 7.7* 7.9* 7.3* 7.3* 7.4*  HCT 25.4* 25.9* 24.2* 23.5* 23.8*  MCV 86.7 87.5 89.0 87.0 86.2  PLT 144* 136* 119* 106* 117*    Cardiac Enzymes: No results for input(s): CKTOTAL, CKMB, CKMBINDEX, TROPONINI in the last 168 hours.  BNP: Invalid input(s): POCBNP  CBG: Recent Labs  Lab 01/06/19 0742 01/06/19 1128  01/06/19 1635 01/06/19 2113 01/07/19 0800  GLUCAP 149* 220* 170* 144* 158*    Microbiology: Results for orders placed or performed during the hospital encounter of 01/02/19  Blood Culture (routine x 2)     Status: None   Collection Time: 01/02/19  8:07 PM   Specimen: BLOOD  Result Value Ref Range Status   Specimen Description BLOOD BLOOD RIGHT HAND  Final   Special Requests   Final    BOTTLES DRAWN AEROBIC AND ANAEROBIC Blood Culture results may not be optimal due to an excessive volume of blood received in culture bottles   Culture   Final    NO GROWTH 5 DAYS Performed at [facility], [address] [place], [place] [address]    Report Status 01/07/2019 FINAL  Final  Blood Culture (routine x 2)     Status: None   Collection Time: 01/02/19  8:12 PM   Specimen: BLOOD  Result Value Ref Range Status   Specimen Description BLOOD BLOOD LEFT HAND  Final   Special Requests   Final    BOTTLES DRAWN AEROBIC AND ANAEROBIC Blood Culture adequate volume   Culture   Final    NO GROWTH 5 DAYS Performed at [facility], [address] [place], [place] [address]    Report Status 01/07/2019 FINAL  Final  SARS Coronavirus 2  (CEPHEID- Performed in [place] hospital lab), Hosp Order     Status: None   Collection Time: 01/02/19  8:52 PM   Specimen: Nasopharyngeal Swab  Result Value Ref Range Status   SARS Coronavirus 2 NEGATIVE NEGATIVE Final    Comment: (NOTE) If result is NEGATIVE SARS-CoV-2 target nucleic acids are NOT DETECTED. The SARS-CoV-2 RNA is generally detectable in upper and lower  respiratory specimens during the acute phase of infection. The lowest  concentration of SARS-CoV-2 viral copies this assay can detect is 250  copies / mL. A negative result does not preclude SARS-CoV-2 infection  and should not be used as the sole basis for treatment or other  patient management decisions.  A negative result may occur with  improper specimen collection / handling, submission of specimen other  than nasopharyngeal swab, presence of viral mutation(s) within the  areas targeted by this assay, and inadequate number of viral copies  (<250 copies / mL). A negative result must be combined with clinical  observations, patient history, and epidemiological information. If result is POSITIVE SARS-CoV-2 target nucleic acids are DETECTED. The SARS-CoV-2 RNA is generally detectable in upper and lower  respiratory specimens dur ing the acute phase of infection.  Positive  results are indicative of active infection with SARS-CoV-2.  Clinical  correlation with patient history and other diagnostic information is  necessary to determine patient infection status.  Positive results do  not rule out bacterial infection or co-infection with other viruses. If result is PRESUMPTIVE POSTIVE SARS-CoV-2 nucleic acids MAY BE PRESENT.   A presumptive positive result was obtained on the submitted specimen  and confirmed on repeat testing.  While 2019 novel coronavirus  (SARS-CoV-2) nucleic acids may be present in the submitted sample  additional confirmatory testing may be necessary for epidemiological  and / or clinical  management purposes  to differentiate between  SARS-CoV-2 and other Sarbecovirus currently known to infect humans.  If clinically indicated additional testing with an alternate test  methodology [phone]) is advised. The SARS-CoV-2 RNA is generally  detectable in upper and lower respiratory sp ecimens during the acute  phase of  infection. The expected result is Negative. Fact Sheet for Patients:  StrictlyIdeas.no Fact Sheet for Healthcare Providers: BankingDealers.co.za This test is not yet approved or cleared by the [place] FDA and has been authorized for detection and/or diagnosis of SARS-CoV-2 by FDA under an Emergency Use Authorization (EUA).  This EUA will remain in effect (meaning this test can be used) for the duration of the COVID-19 declaration under Section 564(b)(1) of the Act, 21 U.S.C. section 360bbb-3(b)(1), unless the authorization is terminated or revoked sooner. Performed at [facility], [address], [place], [place] [address]   Urine culture     Status: None   Collection Time: 01/02/19  8:52 PM   Specimen: Urine, Random  Result Value Ref Range Status   Specimen Description   Final    URINE, RANDOM Performed at [facility], [address], [place], [place] [address]    Special Requests   Final    NONE Performed at [facility], [address], [place], [place] [address]    Culture   Final    NO GROWTH Performed at [facility], [place] [address] [place], [place] [address]    Report Status 01/03/2019 FINAL  Final  MRSA PCR Screening     Status: None   Collection Time: 01/03/19  8:59 PM   Specimen: Nasal Mucosa; Nasopharyngeal  Result Value Ref Range Status   MRSA by PCR NEGATIVE NEGATIVE Final    Comment:        The GeneXpert MRSA Assay (FDA approved for NASAL specimens only), is one component of a comprehensive MRSA colonization surveillance program. It is  not intended to diagnose MRSA infection nor to guide or monitor treatment for MRSA infections. Performed at [facility], [address] [place], [place] [address]     Coagulation Studies: No results for input(s): LABPROT, INR in the last 72 hours.  Urinalysis: No results for input(s): COLORURINE, LABSPEC, PHURINE, GLUCOSEU, HGBUR, BILIRUBINUR, KETONESUR, PROTEINUR, UROBILINOGEN, NITRITE, LEUKOCYTESUR in the last 72 hours.  Invalid input(s): APPERANCEUR    Imaging: No results found.   Medications:   . meropenem (MERREM) IV 500 mg (01/07/19 0904)   . aspirin EC  81 mg Oral Daily  . feeding supplement (GLUCERNA SHAKE)  237 mL Oral BID BM  . ferrous sulfate  325 mg Oral Daily  . finasteride  5 mg Oral Daily  . insulin aspart  0-9 Units Subcutaneous TID WC  . linagliptin  5 mg Oral Daily  . multivitamin-lutein   Oral BID  . pantoprazole  40 mg Oral Daily  . rosuvastatin  5 mg Oral QPM  . sodium bicarbonate  650 mg Oral BID  . tamsulosin  0.4 mg Oral Daily   acetaminophen **OR** acetaminophen, LORazepam, magnesium hydroxide, ondansetron **OR** ondansetron (ZOFRAN) IV  Assessment/ Plan:  [name] is a 85 y.o. black male with high-grade bladder cancer, incompletely resected tumor, severe bilateral hydronephrosis, history of prostate cancer, recurrent UTIs, atrial fibrillation, pacemaker/defibrillator, diabetes mellitus type II who is admitted to [facility] on 01/02/2019 for bilateral hydronephrosis  1. Acute renal failure with obstructive uropathy, bilateral hydronephrosis. On Chronic kidney disease stage IV.  Baseline creatinine 2.22, GFR of 28; 09/13/2018 Status post left percutaneous nephrostomy tube placement by IR on 6/16. Right placed on on 6/19.  -Right Nephrostogram done on June 23 shows the tube is in the correct position.  No source of bleeding was identified. Serum creatinine improved today to 4.37 We will repeat  maintenance IV fluids 75 cc/h x 12  hours  Poor candidate for chronic dialysis  2.  Anemia of chronic kidney disease:  Lab Results  Component Value Date   HGB 7.4 (L) 01/06/2019  not candidate for Procrit due to malignancy  3. Pseudomonas sepsis from urinary source 6/15 Iv meropenem till 01/19/19 as recommended by ID Patient has a right arm PICC line   LOS: [place] 6/28/20209:50 AM

## 2019-01-07 NOTE — Progress Notes (Signed)
PT Cancellation Note  Patient Details Name: Blake [name] MRN: [id number] DOB: [birth year]   Cancelled Treatment:    Reason Eval/Treat Not Completed: Fatigue/lethargy limiting ability to participate;Other (comment). Treatment attempted initially; pt sleeping, but able to awaken and pt refuses at this time noting he is hungry (lunch tray arrives) and pt wishes to eat. Agreed to return. Second attempt, pt fatigued and declines. Re attempt tomorrow.    Larae Grooms, PTA 01/07/2019, 1:26 PM

## 2019-01-07 NOTE — TOC Progression Note (Signed)
Transition of Care [facility]) - Progression Note    Patient Details  Name: Blake [name] MRN: [id number] Date of Birth: 09-Mar-1923  Transition of Care [facility] CM/SW Contact  Latanya Maudlin, RN Phone Number: 01/07/2019, 8:09 AM  Clinical Narrative:   [facility] team met with family to discuss disposition as they wanted more information on what the best disposition may be for their family. Patient currently at home with his wife and adult children. Patient active with RN as he is receiving IV abx through [facility]. After speaking with family it has been determined that their priority is continuation of IV abx treatment. They would like continuation of home health with [facility] but are requesting to have a home health aide added to the home health orders since th patient requires frequent assistance with ADL's. Family also requesting palliative care to follow for goals of care and possible transition to hospice level care. Notified Santiago Glad with [facility] who is following. Patient IV abx medication is being switched due to a suspected reaction. Spoke with Pam at [facility] and she will coordinate with daughter once IV abx need is known. ID is following and will need to place new OPAT. They are also requesting EMS for home transport.     Expected Discharge Plan: [place] Barriers to Discharge: Continued Medical Work up  Expected Discharge Plan and Services Expected Discharge Plan: [place] In-house Referral: Hospice / Palliative Care Discharge Planning Services: CM Consult Post Acute Care Choice: Resumption of Svcs/PTA Provider Living arrangements for the past 2 months: Single Family Home Expected Discharge Date: 01/06/19                         HH Arranged: RN, Nurse's Aide HH Agency: [place] ([place]) Date HH Agency Contacted: 01/07/19 Time [place]: 0809 Representative spoke with at [place]: [place]  ([place]) Interventions    Readmission Risk Interventions Readmission Risk Prevention Plan 12/30/2018 12/25/2018 11/24/2018  Transportation Screening Complete Complete Complete  PCP or Specialist Appt within 3-5 Days - - Complete  HRI or [place] - Complete Complete  Social Work Consult for [place] Planning/Counseling - Complete (No Data)  Palliative Care Screening - - Not Applicable  Medication Review Press photographer) Complete Complete Complete  HRI or Home Care Consult Complete - -  Some recent data might be hidden

## 2019-01-07 NOTE — Progress Notes (Signed)
[place] at [place] NAME: Blake [name]    MR#:  [id number]  DATE OF BIRTH:  12-08-1922  SUBJECTIVE:   Patient is feeling better   REVIEW OF SYSTEMS:    CONSTITUTIONAL: No documented fever. No fatigue, weakness. No weight gain, no weight loss.  EYES: No blurry or double vision.  ENT: No tinnitus. No postnasal drip. No redness of the oropharynx.  RESPIRATORY: No cough, no wheeze, no hemoptysis. No dyspnea.  CARDIOVASCULAR: No chest pain. No orthopnea. No palpitations. No syncope.  GASTROINTESTINAL: No nausea, no vomiting or diarrhea. No abdominal pain. No melena or hematochezia.  GENITOURINARY:  No urgency. No frequency. No dysuria. No hematuria. No obstructive symptoms. No discharge. No pain. No significant abnormal bleeding ENDOCRINE: No polyuria or nocturia. No heat or cold intolerance.  HEMATOLOGY: No anemia. No bruising. No bleeding. No purpura. No petechiae INTEGUMENTARY: No rashes. No lesions.  MUSCULOSKELETAL: No arthritis. No swelling. No gout.  NEUROLOGIC: No numbness, tingling, or ataxia. No seizure-type activity.  PSYCHIATRIC: No anxiety. No insomnia. No ADD.     DRUG ALLERGIES:   Allergies  Allergen Reactions  . Diovan [Valsartan] Cough  . Gabapentin Other (See Comments)    Sedation at all doses  . Hydralazine Itching  . Lisinopril Cough  . Pregabalin Other (See Comments)    Sedation at all doses  . Levofloxacin Other (See Comments)    Too many side effects. Nausea, vomiting, upset stomach, increased confusion, etc Other reaction(s): Confusion Nausea, chills Nausea, chills   . Sulfamethoxazole-Trimethoprim Other (See Comments)    Too many side effects. Nausea, vomiting, upset stomach, increased confusion, etc    VITALS:  Blood pressure (!) 154/97, pulse (!) 121, temperature 98.6 F (37 C), resp. rate 15, height 6\' 2"  (1.88 m), weight 91.8 kg, SpO2 98 %.  PHYSICAL EXAMINATION:   Physical  Exam  GENERAL:  85 y.o.-year-old patient lying in the bed with no acute distress. Chronically ill, pallor+ very weak and deconditioned EYES: Pupils equal, round, reactive to light and accommodation. No scleral icterus. Extraocular muscles intact. Pallor+ HEENT: Head atraumatic, normocephalic. Oropharynx and nasopharynx clear.  NECK:  Supple, no jugular venous distention. No thyroid enlargement, no tenderness.  LUNGS: Normal breath sounds bilaterally, no wheezing, rales, rhonchi. No use of accessory muscles of respiration.  CARDIOVASCULAR: S1, S2 normal. No murmurs, rubs, or gallops.  ABDOMEN: Soft, nontender, nondistended. Bowel sounds present. No organomegaly or mass. Bilateral nephrostomy tubes present EXTREMITIES: No cyanosis, clubbing or edema b/l.   PICC line + NEUROLOGIC: Cranial nerves II through XII are intact. No focal Motor or sensory deficits b/l. Moves all extremities well severe deconditioned PSYCHIATRIC: Patient is alert SKIN: No obvious rash, lesion, or ulcer.   LABORATORY PANEL:  CBC Recent Labs  Lab 01/06/19 0459  WBC 12.3*  HGB 7.4*  HCT 23.8*  PLT 117*    Chemistries  Recent Labs  Lab 01/02/19 2014 01/03/19 0444  01/07/19 0525  NA 138 139   < > 143  K 3.2* 4.3   < > 4.2  CL 100 103   < > 111  CO2 22 22   < > 23  GLUCOSE 190* 203*   < > 171*  BUN 76* 71*   < > 65*  CREATININE 4.95* 4.85*   < > 4.37*  CALCIUM 8.6* 8.4*   < > 8.4*  MG  --  1.7  --   --   AST 25  --   --   --  ALT 20  --   --   --   ALKPHOS 63  --   --   --   BILITOT 0.3  --   --   --    < > = values in this interval not displayed.   Cardiac Enzymes No results for input(s): TROPONINI in the last 168 hours. RADIOLOGY:  No results found. ASSESSMENT AND PLAN:  Blake [name] is an 85 y.o. male with a known history of AAA, CHF, DM, HTN, PPM, HLD who was discharged yesterday after being admitted here for Pseudomonas bacteremia with urinary obstruction status post bilateral nephrostomy  and PICC line placement for which he has been getting IV cefepime. He had a seizure episode after going home and this was new onset for him.  1.  Seizure of new onset. - cont  seizure precautions.  -  PRN IV Ativan.   - neurology consult  by Dr. Irish [name]-- Hold off antiseizure meds since this is provoked with recent sepsis and on cefepime (lowers seizure threshold)  2.  Bilateral lower lobe pneumonia, likely aspiration.     -Continue on IV meropenem  3.  Acute kidney injury superimposed on chronic kidney disease-IV due to obstruction from bilateral hydroureteronephrosis with history of bladder cancer, status post bilateral nephrostomy tubes.   -Continue to IV hydrate -Renal function is continued to improve  4. Essential Hypertension.  -Blood pressure elevated resume metoprolol  5. DM Type II.   -on supplemental coverage with NovoLog and follow fingerstick blood glucose.   - on  Januvia.  6. Acute on chronic anemia secondary to multiple medical reasons including nephrectomy tube bleeding. -Workup done cannot find source of bleeding. -Transfuse as needed. Hemoglobin 7.9--7.3--BT 1 unit--7.3- 7.4  -Continue monitor CBC  7. Hyperlipidemia.  Resume Crestor.   8. BPH.  Resume Flomax and finasteride  9.  DVT prophylaxis.   avoid antiplatelet secondary to anemia and losing in nephrectomy tube. Will use SCD  Physical therapy to be initiated however currently pt is very deconditioned and overall declining.Marland Kitchen He carries a poor prognosis.consider Palliative care consult  Prognosis poor in this patient who has had recurrent admissions in the past few months  CODE STATUS: full    TOTAL TIME TAKING CARE OF THIS PATIENT: *30 minutes.  >50% time spent on counselling and coordination of care    Note: This dictation was prepared with Dragon dictation along with smaller phrase technology. Any transcriptional errors that result from this process are unintentional.  Blake [name]  M.D on 01/07/2019 at 2:00 PM  Between 7am to 6pm - Pager - [phone]  After 6pm go to [email or web address] - password [email or web address] [facility]  Office  [phone]  CC: Primary care physician; Blake [name], MDPatient ID: Blake [name], male   DOB: 08-11-1922, 85 y.o.   MRN: [id number]

## 2019-01-08 ENCOUNTER — Inpatient Hospital Stay: Payer: Medicare Other

## 2019-01-08 DIAGNOSIS — Z881 Allergy status to other antibiotic agents status: Secondary | ICD-10-CM

## 2019-01-08 DIAGNOSIS — D72829 Elevated white blood cell count, unspecified: Secondary | ICD-10-CM

## 2019-01-08 DIAGNOSIS — Z888 Allergy status to other drugs, medicaments and biological substances status: Secondary | ICD-10-CM

## 2019-01-08 LAB — CBC WITH DIFFERENTIAL/PLATELET
Abs Immature Granulocytes: 0.2 10*3/uL — ABNORMAL HIGH (ref 0.00–0.07)
Basophils Absolute: 0.1 10*3/uL (ref 0.0–0.1)
Basophils Relative: 1 %
Eosinophils Absolute: 0.2 10*3/uL (ref 0.0–0.5)
Eosinophils Relative: 2 %
HCT: 23.8 % — ABNORMAL LOW (ref 39.0–52.0)
Hemoglobin: 7.4 g/dL — ABNORMAL LOW (ref 13.0–17.0)
Immature Granulocytes: 2 %
Lymphocytes Relative: 8 %
Lymphs Abs: 0.7 10*3/uL (ref 0.7–4.0)
MCH: 27 pg (ref 26.0–34.0)
MCHC: 31.1 g/dL (ref 30.0–36.0)
MCV: 86.9 fL (ref 80.0–100.0)
Monocytes Absolute: 0.6 10*3/uL (ref 0.1–1.0)
Monocytes Relative: 7 %
Neutro Abs: 7.4 10*3/uL (ref 1.7–7.7)
Neutrophils Relative %: 80 %
Platelets: 124 10*3/uL — ABNORMAL LOW (ref 150–400)
RBC: 2.74 MIL/uL — ABNORMAL LOW (ref 4.22–5.81)
RDW: 15.9 % — ABNORMAL HIGH (ref 11.5–15.5)
WBC: 9.1 10*3/uL (ref 4.0–10.5)
nRBC: 0 % (ref 0.0–0.2)

## 2019-01-08 LAB — BASIC METABOLIC PANEL
Anion gap: 8 (ref 5–15)
BUN: 65 mg/dL — ABNORMAL HIGH (ref 8–23)
CO2: 22 mmol/L (ref 22–32)
Calcium: 8.3 mg/dL — ABNORMAL LOW (ref 8.9–10.3)
Chloride: 113 mmol/L — ABNORMAL HIGH (ref 98–111)
Creatinine, Ser: 4.18 mg/dL — ABNORMAL HIGH (ref 0.61–1.24)
GFR calc Af Amer: 13 mL/min — ABNORMAL LOW (ref >60)
GFR calc non Af Amer: 11 mL/min — ABNORMAL LOW (ref >60)
Glucose, Bld: 173 mg/dL — ABNORMAL HIGH (ref 70–99)
Potassium: 4.3 mmol/L (ref 3.5–5.1)
Sodium: 143 mmol/L (ref 135–145)

## 2019-01-08 LAB — GLUCOSE, CAPILLARY
Glucose-Capillary: 153 mg/dL — ABNORMAL HIGH (ref 70–99)
Glucose-Capillary: 186 mg/dL — ABNORMAL HIGH (ref 70–99)
Glucose-Capillary: 148 mg/dL — ABNORMAL HIGH (ref 70–99)
Glucose-Capillary: 174 mg/dL — ABNORMAL HIGH (ref 70–99)

## 2019-01-08 LAB — PROCALCITONIN: Procalcitonin: 0.19 ng/mL

## 2019-01-08 MED ORDER — SODIUM CHLORIDE 0.9 % IV SOLN
500.0000 mg | Freq: Two times a day (BID) | INTRAVENOUS | Status: DC
  Administered 2019-01-08 – 2019-01-09 (×3): 500 mg via INTRAVENOUS
  Filled 2019-01-08: qty 500
  Filled 2019-01-08: qty 0.5
  Filled 2019-01-08 (×2): qty 500

## 2019-01-08 MED ORDER — SODIUM CHLORIDE 0.9 % IV SOLN
INTRAVENOUS | Status: DC | PRN
  Administered 2019-01-08: 10:00:00 5 mL via INTRAVENOUS
  Administered 2019-01-08 (×2): 20 mL via INTRAVENOUS
  Administered 2019-01-09: 5 mL via INTRAVENOUS

## 2019-01-08 NOTE — Progress Notes (Signed)
Speech Language Pathology Treatment: Dysphagia  Patient Details Name: Blake [name] MRN: [id number] DOB: September 19, 1922 Today's Date: 01/08/2019 Time: 1478-2956 SLP Time Calculation (min) (ACUTE ONLY): 50 min  Assessment / Plan / Recommendation Clinical Impression  Pt seen for ongoing assessment of toleration of diet; education w/ aspiration precautions. Pt was resting in bed w/ eyes closed but awakened easily to verbal stim. He required full assistance in sitting upright and midline in bed - Confusion noted in his engagement and follow through w/ instructions. Pt asked when "it was suppose to snow again" when SLP turned on the tv per his request. Pt often had his eyes closed though he verbally responded to SLP re: his meal.  Pt consumed trials of thin liquids via cup (but then he requested a straw to drink w/) and purees/soft solids w/ No overt s/s of aspiration noted; no decline in vocal quality or respiratory status during/post po trials. Oral phase was adequate in presentation though min increased time noted as pt masticated and prepped the solid boluses for swallowing. This lengthier masticating during the oral phase can be seen w/ Cognitive decline. Given time, and alternating foods/liquids, allowed pt to orally clearly appropriately. Pt helped to feed self by holding the Cup when drinking but did not attempt to use utensils. No oral motor weakness noted.  Of note, pt presented w/ Impulsive drinking behavior often sucking quickly from the straw. Education given on slowing down and using smaller, single sips to lessen risk for aspiration, choking. This type of Impulsive drinking behavior can also be seen w/ Cognitive decline.  Recommend pt continue w/ current diet of Regular/Mech Soft(cut meats, cooked foods easy to chew) w/ thin liquids; general aspiration precautions; Pills in Puree for easier, safer swallowing. Pt needs support at meals for follow through w/ precautions and w/ feeding. Recommend  f/u w/ Dietician as needed for support.  No further skilled ST services indicated at this time as pt appears to adequately tolerate an oral diet w/ supportive strategies from others. Education(as discussed w/ Son last session) provided to pt for discharge on aspiration precautions.     HPI HPI: Pt is a 85 y.o. male with a known history of diabetes type 2, chronic kidney disease, triple AAA, hyperlipidemia, hypertension, prostate cancer who was recently hospitalized w/ UTI and Acute on Chronic renal failure III- secondary to obstruction from bilateral hydroureteronephrosis with h/o Bladder cancer.  Pt also has Acute on chronic diastolic CHF-this is the source of patient's worsening shortness of breath per MD notes.  He received bilateral nephrostomy and PICC line placement for which he has been getting IV cefepime.  He had a seizure episode after going home and this was new onset for him.  Upon arrival of EMS he had another seizure episode and has been postictal since then till arrival to the ER.  CXR: Interval development of bilateral lower lobe predominant airspace disease.  Currently, he is more alert but presents w/ confusion and required Mod+ cues for follow through w/ tasks and answering questions - unsure of pt's Baseline Cognitive functioning; MRI: identified are chronic microvascular ischemic changes and extensive volume loss, similar to prior study dated November 05, 2018.       SLP Plan  All goals met       Recommendations  Diet recommendations: Dysphagia 3 (mechanical soft);Thin liquid Liquids provided via: Cup;Straw(monitor) Medication Administration: Whole meds with puree(for safer swallowing - crushed as needed) Supervision: Patient able to self feed;Staff to assist with self feeding;Full  supervision/cueing for compensatory strategies Compensations: Minimize environmental distractions;Slow rate;Small sips/bites;Lingual sweep for clearance of pocketing;Follow solids with liquid Postural  Changes and/or Swallow Maneuvers: Seated upright 90 degrees;Upright 30-60 min after meal                General recommendations: (Dietician f/u) Oral Care Recommendations: Oral care BID;Staff/trained caregiver to provide oral care Follow up Recommendations: None SLP Visit Diagnosis: Dysphagia, oral phase (R13.11)(impacted by Cognitive decline) Plan: All goals met       GO                 Orinda Kenner, MS, CCC-SLP Watson,Katherine 01/08/2019, 3:26 PM

## 2019-01-08 NOTE — Treatment Plan (Signed)
Diagnosis: Pseudomonas bacteremia and complicated UTI Baseline Creatinine 4.18 (Cr cl 12.6)   Allergies  Allergen Reactions  . Diovan [Valsartan] Cough  . Gabapentin Other (See Comments)    Sedation at all doses  . Hydralazine Itching  . Lisinopril Cough  . Pregabalin Other (See Comments)    Sedation at all doses  . Levofloxacin Other (See Comments)    Too many side effects. Nausea, vomiting, upset stomach, increased confusion, etc Other reaction(s): Confusion Nausea, chills Nausea, chills   . Sulfamethoxazole-Trimethoprim Other (See Comments)    Too many side effects. Nausea, vomiting, upset stomach, increased confusion, etc  cefepime-  OPAT Orders Discharge antibiotics: Meropenem 500mg  IV every 12 hrs ( dose will have to be adjusted depending on crcl) End Date: 01/19/19  [facility] Care Per Protocol:  Labs twice weekly while on IV antibiotics: _X_ CBC with differential _X_ BMP  _X_ Please pull PIC at completion of IV antibiotics   Fax weekly labs to [phone]  No follow up needed with me Call [phone] with any questions

## 2019-01-08 NOTE — Progress Notes (Signed)
Physical Therapy Treatment Patient Details Name: Blake [name] MRN: [id number] DOB: 02-04-1923 Today's Date: 01/08/2019    History of Present Illness presented to ER (after recent discharge) due to seizure activity; admitted for seizure control/management and bilat LL PNA.  OF note, recent hospitalization (discharged 1-2 days prior) due to pseudomonas bacteremia, urinary obstruction s//p bilat nephrostomy tube placement.  Also of note, per recent imaging, patient noted with 5.5 cm infrarenal AAA, followed by outpatient vascular.    PT Comments    Patient woken at start of session, repositioned to aide in keeping pt awake. Pt able to reposition himself with HOB elevated. Some LE exercises performed (SLR and heel slides) to assess pt ability to follow commands and in prep for mobility, tactile and verbal cues needed, very little physical assist needed to perform. Supine to sit with HOB with minA. Pt able to sit EOB for several minutes with fair balance. Sit<>stand with RW and modAx1, pt able to take one lateral step towards head of bed. Stand pivot to [facility] minAx2 with RW, and then ambulated several steps to recliner. Pt very fatigued at end of mobility. HR fluctuates to 120-130s but returns to [facility] quickly while seated. Overall the patient demonstrated an increased need for physical assist for functional mobility, decreased activity tolerance/endurance, and recommendation updated to STR to reflect these changes.     Follow Up Recommendations  SNF     Equipment Recommendations  Rolling walker with 5" wheels    Recommendations for Other Services       Precautions / Restrictions Precautions Precautions: Fall;ICD/Pacemaker Precaution Comments: bilat neph tubes Restrictions Weight Bearing Restrictions: No    Mobility  Bed Mobility Overal bed mobility: Needs Assistance Bed Mobility: Supine to Sit     Supine to sit: Min assist     General bed mobility comments: assist for task  initiation and truncal elevation; heavy use of bedrails, increased time required  Transfers Overall transfer level: Needs assistance Equipment used: Rolling walker (2 wheeled) Transfers: Sit to/from Stand Sit to Stand: Mod assist;Min assist;+2 physical assistance         General transfer comment: First attempted modA, pt unable to maintain balance with stabilizing posteriorly on bed rails. minAx2 with RW pt had difficulty with full standing.  Ambulation/Gait Ambulation/Gait assistance: Min assist;+2 safety/equipment Gait Distance (Feet): 3 Feet Assistive device: Rolling walker (2 wheeled) Gait Pattern/deviations: Step-through pattern;Decreased step length - right;Decreased step length - left;Trunk flexed     General Gait Details: Pt very fatigued at end of mobility. HR fluctuates to 120-130s but returns to [facility] quickly while seated   Stairs             Wheelchair Mobility    Modified Rankin (Stroke Patients Only)       Balance Overall balance assessment: Needs assistance Sitting-balance support: No upper extremity supported;Feet supported Sitting balance-Leahy Scale: Good     Standing balance support: Bilateral upper extremity supported Standing balance-Leahy Scale: Poor Standing balance comment: Continual minA+1 to maintain balance                            Cognition Arousal/Alertness: Awake/alert Behavior During Therapy: WFL for tasks assessed/performed Overall Cognitive Status: Within Functional Limits for tasks assessed                                 General Comments: Pt oriented to self  and place      Exercises Other Exercises Other Exercises: stand pivot to bedside commode, 2+modA for safety, pt with poor controlled descent to commode.    General Comments        Pertinent Vitals/Pain Pain Assessment: No/denies pain Faces Pain Scale: No hurt    Home Living                      Prior Function             PT Goals (current goals can now be found in the care plan section) Acute Rehab PT Goals PT Goal Formulation: With patient Time For Goal Achievement: 01/17/19 Progress towards PT goals: Not progressing toward goals - comment(Pt with decreased ability to perform mobility this session)    Frequency    Min 2X/week      PT Plan Discharge plan needs to be updated    Co-evaluation              AM-PAC PT "6 Clicks" Mobility   Outcome Measure  Help needed turning from your back to your side while in a flat bed without using bedrails?: A Lot Help needed moving from lying on your back to sitting on the side of a flat bed without using bedrails?: A Lot Help needed moving to and from a bed to a chair (including a wheelchair)?: A Lot Help needed standing up from a chair using your arms (e.g., wheelchair or bedside chair)?: A Lot Help needed to walk in hospital room?: A Lot Help needed climbing 3-5 steps with a railing? : Total 6 Click Score: 11    End of Session Equipment Utilized During Treatment: Gait belt Activity Tolerance: Patient limited by fatigue Patient left: in chair;with call bell/phone within reach;with chair alarm set Nurse Communication: Mobility status PT Visit Diagnosis: Unsteadiness on feet (R26.81);Muscle weakness (generalized) (M62.81);Difficulty in walking, not elsewhere classified (R26.2)     Time: 9169-4503 PT Time Calculation (min) (ACUTE ONLY): 48 min  Charges:  $Therapeutic Exercise: 23-37 mins $Therapeutic Activity: 8-22 mins                     Lieutenant Diego PT, DPT 4:41 PM,01/08/19 [phone]

## 2019-01-08 NOTE — Progress Notes (Addendum)
At bedside to draw morning labs. Picc line flushes well but no blood return. Dressing due for change so performed dressing changed. Noted that catheter had migrated away from skin about 2-3 inches. Completed dressing change. Notified MD of situation so that xray could be obtained to verify placement of line. Lab notified that blood work would need to be obtained by peripheral stick at this time. Site saline locked at this time. No edema, redness of signs of infection at site.

## 2019-01-08 NOTE — Progress Notes (Signed)
Xray results back. MD notified. Contacted IV team for assessment and possible cathflo consult. IV team nurse called back and stated he will come to bedside to assess patient for next steps.

## 2019-01-08 NOTE — Progress Notes (Signed)
[facility]  ROUNDING NOTE   Subjective:  Patient resting comfortably in bed. Appears lethargic but is arousable. Urine output was 1.9 L over the preceding 24 hours.    Objective:  Vital signs in last 24 hours:  Temp:  [98.7 F (37.1 C)-98.8 F (37.1 C)] 98.8 F (37.1 C) (06/29 0714) Pulse Rate:  [73-144] 105 (06/29 0954) Resp:  [16-20] 16 (06/29 0714) BP: (142-151)/(77-95) 146/80 (06/29 0954) SpO2:  [97 %-99 %] 97 % (06/29 0714)  Weight change:  Filed Weights   01/03/19 1222  Weight: 91.8 kg    Intake/Output: I/O last 3 completed shifts: In: 824.9 [P.O.:237; I.V.:492.3; IV Piggyback:95.6] Out: 2575 [Urine:2575]   Intake/Output this shift:  Total I/O In: 225.1 [P.O.:120; I.V.:5.1; IV Piggyback:100] Out: 1000 [Urine:1000]  Physical Exam: General: NAD, frail, elderly  Head: Moist oral mucous membranes  Eyes: Anicteric  Neck: Supple   Lungs:  Clear to auscultation, normal effort  Heart:  Irregular, no rubs  Abdomen:  Soft, nontender  Extremities: trace  peripheral edema.  Neurologic: Lethargic but arousable   GU Bilateral percutaneous nephrostomy tube    Basic Metabolic Panel: Recent Labs  Lab 01/03/19 0444 01/04/19 0720 01/05/19 0515 01/06/19 0459 01/07/19 0525 01/08/19 0549  NA 139  --  137 140 143 143  K 4.3  --  4.0 4.0 4.2 4.3  CL 103  --  104 108 111 113*  CO2 22  --  21* 22 23 22   GLUCOSE 203*  --  162* 149* 171* 173*  BUN 71*  --  74* 68* 65* 65*  CREATININE 4.85* 5.03* 5.29* 4.78* 4.37* 4.18*  CALCIUM 8.4*  --  8.2* 8.3* 8.4* 8.3*  MG 1.7  --   --   --   --   --     Liver Function Tests: Recent Labs  Lab 01/02/19 2014  AST 25  ALT 20  ALKPHOS 63  BILITOT 0.3  PROT 8.2*  ALBUMIN 3.1*   Recent Labs  Lab 01/02/19 2014  LIPASE 47   No results for input(s): AMMONIA in the last 168 hours.  CBC: Recent Labs  Lab 01/02/19 2014 01/03/19 0444 01/04/19 0720 01/05/19 0515 01/06/19 0459 01/08/19 0549  WBC 9.3  20.4* 18.6* 16.4* 12.3* 9.1  NEUTROABS 6.7  --   --   --   --  7.4  HGB 7.7* 7.9* 7.3* 7.3* 7.4* 7.4*  HCT 25.4* 25.9* 24.2* 23.5* 23.8* 23.8*  MCV 86.7 87.5 89.0 87.0 86.2 86.9  PLT 144* 136* 119* 106* 117* 124*    Cardiac Enzymes: No results for input(s): CKTOTAL, CKMB, CKMBINDEX, TROPONINI in the last 168 hours.  BNP: Invalid input(s): POCBNP  CBG: Recent Labs  Lab 01/07/19 1151 01/07/19 1649 01/07/19 2053 01/08/19 0734 01/08/19 1139  GLUCAP 150* 157* 200* 153* 186*    Microbiology: Results for orders placed or performed during the hospital encounter of 01/02/19  Blood Culture (routine x 2)     Status: None   Collection Time: 01/02/19  8:07 PM   Specimen: BLOOD  Result Value Ref Range Status   Specimen Description BLOOD BLOOD RIGHT HAND  Final   Special Requests   Final    BOTTLES DRAWN AEROBIC AND ANAEROBIC Blood Culture results may not be optimal due to an excessive volume of blood received in culture bottles   Culture   Final    NO GROWTH 5 DAYS Performed at [facility], [address], [place], [place] [address]    Report  Status 01/07/2019 FINAL  Final  Blood Culture (routine x 2)     Status: None   Collection Time: 01/02/19  8:12 PM   Specimen: BLOOD  Result Value Ref Range Status   Specimen Description BLOOD BLOOD LEFT HAND  Final   Special Requests   Final    BOTTLES DRAWN AEROBIC AND ANAEROBIC Blood Culture adequate volume   Culture   Final    NO GROWTH 5 DAYS Performed at [facility], [address], [place], [place] [address]    Report Status 01/07/2019 FINAL  Final  SARS Coronavirus 2 (CEPHEID- Performed in [place] hospital lab), Hosp Order     Status: None   Collection Time: 01/02/19  8:52 PM   Specimen: Nasopharyngeal Swab  Result Value Ref Range Status   SARS Coronavirus 2 NEGATIVE NEGATIVE Final    Comment: (NOTE) If result is NEGATIVE SARS-CoV-2 target nucleic acids are NOT DETECTED. The SARS-CoV-2 RNA is  generally detectable in upper and lower  respiratory specimens during the acute phase of infection. The lowest  concentration of SARS-CoV-2 viral copies this assay can detect is 250  copies / mL. A negative result does not preclude SARS-CoV-2 infection  and should not be used as the sole basis for treatment or other  patient management decisions.  A negative result may occur with  improper specimen collection / handling, submission of specimen other  than nasopharyngeal swab, presence of viral mutation(s) within the  areas targeted by this assay, and inadequate number of viral copies  (<250 copies / mL). A negative result must be combined with clinical  observations, patient history, and epidemiological information. If result is POSITIVE SARS-CoV-2 target nucleic acids are DETECTED. The SARS-CoV-2 RNA is generally detectable in upper and lower  respiratory specimens dur ing the acute phase of infection.  Positive  results are indicative of active infection with SARS-CoV-2.  Clinical  correlation with patient history and other diagnostic information is  necessary to determine patient infection status.  Positive results do  not rule out bacterial infection or co-infection with other viruses. If result is PRESUMPTIVE POSTIVE SARS-CoV-2 nucleic acids MAY BE PRESENT.   A presumptive positive result was obtained on the submitted specimen  and confirmed on repeat testing.  While 2019 novel coronavirus  (SARS-CoV-2) nucleic acids may be present in the submitted sample  additional confirmatory testing may be necessary for epidemiological  and / or clinical management purposes  to differentiate between  SARS-CoV-2 and other Sarbecovirus currently known to infect humans.  If clinically indicated additional testing with an alternate test  methodology [id number]) is advised. The SARS-CoV-2 RNA is generally  detectable in upper and lower respiratory sp ecimens during the acute  phase of  infection. The expected result is Negative. Fact Sheet for Patients:  StrictlyIdeas.no Fact Sheet for Healthcare Providers: BankingDealers.co.za This test is not yet approved or cleared by the [place] FDA and has been authorized for detection and/or diagnosis of SARS-CoV-2 by FDA under an Emergency Use Authorization (EUA).  This EUA will remain in effect (meaning this test can be used) for the duration of the COVID-19 declaration under Section 564(b)(1) of the Act, 21 U.S.C. section 360bbb-3(b)(1), unless the authorization is terminated or revoked sooner. Performed at [facility], [address] [place], [place] [address]   Urine culture     Status: None   Collection Time: 01/02/19  8:52 PM   Specimen: Urine, Random  Result Value Ref Range Status   Specimen  Description   Final    URINE, RANDOM Performed at [facility], [address], [place], [place] [address]    Special Requests   Final    NONE Performed at [facility], [address] [place], [place] [address]    Culture   Final    NO GROWTH Performed at [facility], [place] [address], [place], [place] [address]    Report Status 01/03/2019 FINAL  Final  MRSA PCR Screening     Status: None   Collection Time: 01/03/19  8:59 PM   Specimen: Nasal Mucosa; Nasopharyngeal  Result Value Ref Range Status   MRSA by PCR NEGATIVE NEGATIVE Final    Comment:        The GeneXpert MRSA Assay (FDA approved for NASAL specimens only), is one component of a comprehensive MRSA colonization surveillance program. It is not intended to diagnose MRSA infection nor to guide or monitor treatment for MRSA infections. Performed at [facility], [address] [place], [place] [address]     Coagulation Studies: No results for input(s): LABPROT, INR in the last 72 hours.  Urinalysis: No results for input(s): COLORURINE, LABSPEC, PHURINE,  GLUCOSEU, HGBUR, BILIRUBINUR, KETONESUR, PROTEINUR, UROBILINOGEN, NITRITE, LEUKOCYTESUR in the last 72 hours.  Invalid input(s): APPERANCEUR    Imaging: Dg Chest Port 1 View  Result Date: 01/08/2019 CLINICAL DATA:  PICC placement EXAM: PORTABLE CHEST 1 VIEW COMPARISON:  Chest CT 5 days ago FINDINGS: Right upper extremity PICC with tip at the upper SVC. No displacement of dual-chamber pacer leads from the left. Bilateral pneumonia. Hyperinflation. No effusion or pneumothorax. Stable heart size. IMPRESSION: 1. PICC with tip at the SVC. 2. Bilateral pneumonia. Electronically Signed   By: Monte Fantasia M.D.   On: 01/08/2019 05:57     Medications:   . sodium chloride Stopped (01/08/19 1103)  . meropenem (MERREM) IV     . aspirin EC  81 mg Oral Daily  . feeding supplement (GLUCERNA SHAKE)  237 mL Oral BID BM  . ferrous sulfate  325 mg Oral Daily  . finasteride  5 mg Oral Daily  . insulin aspart  0-9 Units Subcutaneous TID WC  . linagliptin  5 mg Oral Daily  . metoprolol tartrate  37.5 mg Oral BID  . multivitamin-lutein   Oral BID  . pantoprazole  40 mg Oral Daily  . rosuvastatin  5 mg Oral QPM  . sodium bicarbonate  650 mg Oral BID  . tamsulosin  0.4 mg Oral Daily   sodium chloride, acetaminophen **OR** acetaminophen, LORazepam, magnesium hydroxide, ondansetron **OR** ondansetron (ZOFRAN) IV  Assessment/ Plan:  Blake [name] is a 85 y.o. black male with high-grade bladder cancer, incompletely resected tumor, severe bilateral hydronephrosis, history of prostate cancer, recurrent UTIs, atrial fibrillation, pacemaker/defibrillator, diabetes mellitus type II who is admitted to [facility] on 01/02/2019 for bilateral hydronephrosis  1. Acute renal failure with obstructive uropathy, bilateral hydronephrosis. On Chronic kidney disease stage IV.  Baseline creatinine 2.22, GFR of 28; 09/13/2018 Status post left percutaneous nephrostomy tube placement by IR on 6/16. Right placed on on 6/19.   -Right Nephrostogram done on June 23 shows the tube is in the correct position.   -Good urine output noted at 1.9 L over the preceding 24 hours.  Renal function overall remains low but stable.  Continue to monitor renal parameters.  Patient would not make a good long-term dialysis candidate given his multiple comorbidities.  2.  Anemia of chronic kidney disease:  Lab Results  Component Value Date   HGB 7.4 (L) 01/08/2019  not candidate for Procrit due to malignancy  3. Pseudomonas sepsis from urinary source 6/15 Iv meropenem till 01/19/19 as recommended by ID Patient has a right arm PICC line   LOS: 5 Adelyne Marchese 6/29/20203:06 PM

## 2019-01-08 NOTE — Progress Notes (Signed)
  Speech Language Pathology Treatment: Dysphagia  Patient Details Name: Blake [name] MRN: [id number] DOB: 1922/07/13 Today's Date: 01/08/2019 Time: 1300-1340 SLP Time Calculation (min) (ACUTE ONLY): 40 min  Assessment / Plan / Recommendation Clinical Impression  Pt seen today w/ Son present for session. Pt was resting and had eaten min amount at lunch meal so time was spent on education of pt's swallowing w/ Son; education w/ aspiration precautions. Pt continues to present w/ Cognitive decline per NSG report. Pt is often resting w/ eyes closed and soft speech. Son reported min increased oral phase mastication time w/ solids has been noted.  Time was spent on education w/ Son on general aspiration precautions - need to drink slowly using SMALL sips;  Information on dysphagia in light of Cognitive decline;  supportive strategies during meals to reduce distractions in environment to allow pt to focus on eating/drinking. Discussed food consistencies and options for easier mastication and oral intake overall. Son agreed.  ST services will f/u w/ pt's status while admitted; ongoing education w/ staff and family; ongoing toleration of diet. Handouts provided for discharge home on general aspiration precautions. Son agreed.     HPI HPI: Pt is a 85 y.o. male with a known history of diabetes type 2, chronic kidney disease, triple AAA, hyperlipidemia, hypertension, prostate cancer who was recently hospitalized w/ UTI and Acute on Chronic renal failure III- secondary to obstruction from bilateral hydroureteronephrosis with h/o Bladder cancer.  Pt also has Acute on chronic diastolic CHF-this is the source of patient's worsening shortness of breath per MD notes.  He received bilateral nephrostomy and PICC line placement for which he has been getting IV cefepime.  He had a seizure episode after going home and this was new onset for him.  Upon arrival of EMS he had another seizure episode and has been postictal since  then till arrival to the ER.  CXR: Interval development of bilateral lower lobe predominant airspace disease.  Currently, he is more alert but presents w/ confusion and required Mod+ cues for follow through w/ tasks and answering questions - unsure of pt's Baseline Cognitive functioning; MRI: identified are chronic microvascular ischemic changes and extensive volume loss, similar to prior study dated November 05, 2018.       SLP Plan  Continue with current plan of care       Recommendations  Diet recommendations: Dysphagia 3 (mechanical soft);Thin liquid Liquids provided via: Cup;Straw(monitor) Medication Administration: Whole meds with puree(for safer swallowing - crushed as needed) Supervision: Patient able to self feed;Staff to assist with self feeding;Full supervision/cueing for compensatory strategies Compensations: Minimize environmental distractions;Slow rate;Small sips/bites;Lingual sweep for clearance of pocketing;Follow solids with liquid Postural Changes and/or Swallow Maneuvers: Seated upright 90 degrees;Upright 30-60 min after meal                General recommendations: (Dietician f/u) Oral Care Recommendations: Oral care BID;Staff/trained caregiver to provide oral care Follow up Recommendations: None SLP Visit Diagnosis: Dysphagia, oral phase (R13.11)(impacted by Cognitive decline) Plan: Continue with current plan of care       GO                 Orinda Kenner, [place], CCC-SLP Neizan Debruhl 01/08/2019, 3:51 PM

## 2019-01-08 NOTE — Progress Notes (Signed)
PHARMACY CONSULT NOTE FOR:  OUTPATIENT  PARENTERAL ANTIBIOTIC THERAPY (OPAT)  Indication: pseudomonas bacteremia and complicated UTI Regimen: meropenem 500mg  IV q12h End date: 01/19/2019  IV antibiotic discharge orders are pended. To discharging provider:  please sign these orders via discharge navigator,  Select New Orders & click on the button choice - Manage This Unsigned Work.     Thank you for allowing pharmacy to be a part of this patient's care.  Doreene Eland, PharmD, BCPS.   Work Cell: [phone] 01/08/2019 3:57 PM

## 2019-01-08 NOTE — Care Management Important Message (Signed)
Important Message  Patient Details  Name: KUNAL LEVARIO MRN: [id number] Date of Birth: [birth year]   Medicare Important Message Given:  Yes     [name] 01/08/2019, 11:10 AM

## 2019-01-08 NOTE — Progress Notes (Signed)
[facility] [place] NAME: Blake [name]    MR#:  [id number]  DATE OF BIRTH:  Jul 05, 1923  SUBJECTIVE:   Patient feeling better denies any complaints   REVIEW OF SYSTEMS:    CONSTITUTIONAL: No documented fever. No fatigue, weakness. No weight gain, no weight loss.  EYES: No blurry or double vision.  ENT: No tinnitus. No postnasal drip. No redness of the oropharynx.  RESPIRATORY: No cough, no wheeze, no hemoptysis. No dyspnea.  CARDIOVASCULAR: No chest pain. No orthopnea. No palpitations. No syncope.  GASTROINTESTINAL: No nausea, no vomiting or diarrhea. No abdominal pain. No melena or hematochezia.  GENITOURINARY:  No urgency. No frequency. No dysuria. No hematuria. No obstructive symptoms. No discharge. No pain. No significant abnormal bleeding ENDOCRINE: No polyuria or nocturia. No heat or cold intolerance.  HEMATOLOGY: No anemia. No bruising. No bleeding. No purpura. No petechiae INTEGUMENTARY: No rashes. No lesions.  MUSCULOSKELETAL: No arthritis. No swelling. No gout.  NEUROLOGIC: No numbness, tingling, or ataxia. No seizure-type activity.  PSYCHIATRIC: No anxiety. No insomnia. No ADD.     DRUG ALLERGIES:   Allergies  Allergen Reactions  . Diovan [Valsartan] Cough  . Gabapentin Other (See Comments)    Sedation at all doses  . Hydralazine Itching  . Lisinopril Cough  . Pregabalin Other (See Comments)    Sedation at all doses  . Levofloxacin Other (See Comments)    Too many side effects. Nausea, vomiting, upset stomach, increased confusion, etc Other reaction(s): Confusion Nausea, chills Nausea, chills   . Sulfamethoxazole-Trimethoprim Other (See Comments)    Too many side effects. Nausea, vomiting, upset stomach, increased confusion, etc    VITALS:  Blood pressure (!) 146/80, pulse (!) 105, temperature 98.8 F (37.1 C), temperature source Oral, resp. rate 16, height 6\' 2"  (1.88 m), weight 91.8 kg, SpO2 97  %.  PHYSICAL EXAMINATION:   Physical Exam  GENERAL:  85 y.o.-year-old patient lying in the bed with no acute distress. Chronically ill, pallor+ very weak and deconditioned EYES: Pupils equal, round, reactive to light and accommodation. No scleral icterus. Extraocular muscles intact. Pallor+ HEENT: Head atraumatic, normocephalic. Oropharynx and nasopharynx clear.  NECK:  Supple, no jugular venous distention. No thyroid enlargement, no tenderness.  LUNGS: Normal breath sounds bilaterally, no wheezing, rales, rhonchi. No use of accessory muscles of respiration.  CARDIOVASCULAR: S1, S2 normal. No murmurs, rubs, or gallops.  ABDOMEN: Soft, nontender, nondistended. Bowel sounds present. No organomegaly or mass. Bilateral nephrostomy tubes present EXTREMITIES: No cyanosis, clubbing or edema b/l.   PICC line + NEUROLOGIC: Cranial nerves II through XII are intact. No focal Motor or sensory deficits b/l. Moves all extremities well severe deconditioned PSYCHIATRIC: Patient is alert SKIN: No obvious rash, lesion, or ulcer.   LABORATORY PANEL:  CBC Recent Labs  Lab 01/08/19 0549  WBC 9.1  HGB 7.4*  HCT 23.8*  PLT 124*    Chemistries  Recent Labs  Lab 01/02/19 2014 01/03/19 0444  01/08/19 0549  NA 138 139   < > 143  K 3.2* 4.3   < > 4.3  CL 100 103   < > 113*  CO2 22 22   < > 22  GLUCOSE 190* 203*   < > 173*  BUN 76* 71*   < > 65*  CREATININE 4.95* 4.85*   < > 4.18*  CALCIUM 8.6* 8.4*   < > 8.3*  MG  --  1.7  --   --   AST  25  --   --   --   ALT 20  --   --   --   ALKPHOS 63  --   --   --   BILITOT 0.3  --   --   --    < > = values in this interval not displayed.   Cardiac Enzymes No results for input(s): TROPONINI in the last 168 hours. RADIOLOGY:  Dg Chest Port 1 View  Result Date: 01/08/2019 CLINICAL DATA:  PICC placement EXAM: PORTABLE CHEST 1 VIEW COMPARISON:  Chest CT 5 days ago FINDINGS: Right upper extremity PICC with tip at the upper SVC. No displacement of  dual-chamber pacer leads from the left. Bilateral pneumonia. Hyperinflation. No effusion or pneumothorax. Stable heart size. IMPRESSION: 1. PICC with tip at the SVC. 2. Bilateral pneumonia. Electronically Signed   By: Monte Fantasia M.D.   On: 01/08/2019 05:57   ASSESSMENT AND PLAN:  Blake [name] is an 85 y.o. male with a known history of AAA, CHF, DM, HTN, PPM, HLD who was discharged yesterday after being admitted here for Pseudomonas bacteremia with urinary obstruction status post bilateral nephrostomy and PICC line placement for which he has been getting IV cefepime. He had a seizure episode after going home and this was new onset for him.  1.  Seizure of new onset.-No further seizures - cont  seizure precautions.  -  PRN IV Ativan.   - neurology consult  by Dr. Irish Elders-- Hold off antiseizure meds since this is provoked with recent sepsis and on cefepime (lowers seizure threshold)  2.  Bilateral lower lobe pneumonia, likely aspiration.     -Continue on IV meropenem -Procalcitonin normal -Similar chest x-ray findings have been noted during previous hospitalization  3.  Acute kidney injury superimposed on chronic kidney disease-IV due to obstruction from bilateral hydroureteronephrosis with history of bladder cancer, status post bilateral nephrostomy tubes.   -Continue to IV hydrate -Renal function is continued to improve  4. Essential Hypertension.  -Blood pressure elevated resume metoprolol  5. DM Type II.   -on supplemental coverage with NovoLog and follow fingerstick blood glucose.   - on  Januvia.  6. Acute on chronic anemia secondary to multiple medical reasons including nephrectomy tube bleeding. -Workup done cannot find source of bleeding. -Transfuse as needed.  Hemoglobin in the 7 range continue to monitor -Continue monitor CBC  7. Hyperlipidemia.  Resume Crestor.   8. BPH.  Resume Flomax and finasteride  9.  DVT prophylaxis.   avoid antiplatelet secondary  to anemia and losing in nephrectomy tube. Will use SCD  Physical therapy to be initiated however currently pt is very deconditioned and overall declining.Marland Kitchen He carries a poor prognosis.consider Palliative care consult  Prognosis poor in this patient who has had recurrent admissions in the past few months  CODE STATUS: full    TOTAL TIME TAKING CARE OF THIS PATIENT: *30 minutes.  >50% time spent on counselling and coordination of care    Note: This dictation was prepared with Dragon dictation along with smaller phrase technology. Any transcriptional errors that result from this process are unintentional.  Dustin Flock M.D on 01/08/2019 at 12:39 PM  Between 7am to 6pm - Pager - [phone]  After 6pm go to [email or web address] - password [email or web address] [facility]  Office  [phone]  CC: Primary care physician; Adin Hector, MDPatient ID: Blake [name], male   DOB: 1922-10-21, 85 y.o.   MRN: [id number]

## 2019-01-08 NOTE — Progress Notes (Signed)
   Date of Admission:  01/02/2019      Subjective: Pt in bed with eyes closed but responds to questions- says he is feeling okay.   Medications:  . aspirin EC  81 mg Oral Daily  . feeding supplement (GLUCERNA SHAKE)  237 mL Oral BID BM  . ferrous sulfate  325 mg Oral Daily  . finasteride  5 mg Oral Daily  . insulin aspart  0-9 Units Subcutaneous TID WC  . linagliptin  5 mg Oral Daily  . metoprolol tartrate  37.5 mg Oral BID  . multivitamin-lutein   Oral BID  . pantoprazole  40 mg Oral Daily  . rosuvastatin  5 mg Oral QPM  . sodium bicarbonate  650 mg Oral BID  . tamsulosin  0.4 mg Oral Daily    Objective: Vital signs in last 24 hours: Temp:  [98.7 F (37.1 C)-98.8 F (37.1 C)] 98.8 F (37.1 C) (06/29 0714) Pulse Rate:  [73-144] 105 (06/29 0954) Resp:  [16-20] 16 (06/29 0714) BP: (142-151)/(77-95) 146/80 (06/29 0954) SpO2:  [97 %-99 %] 97 % (06/29 0714)  PHYSICAL EXAM:  General: eyes closed but responding appropriately Extremities: rt arm pICC   Both nephrostomy  drains have clear urine Lab Results Recent Labs    01/06/19 0459 01/07/19 0525 01/08/19 0549  WBC 12.3*  --  9.1  HGB 7.4*  --  7.4*  HCT 23.8*  --  23.8*  NA 140 143 143  K 4.0 4.2 4.3  CL 108 111 113*  CO2 22 23 22   BUN 68* 65* 65*  CREATININE 4.78* 4.37* 4.18*   Liver Panel No results for input(s): PROT, ALBUMIN, AST, ALT, ALKPHOS, BILITOT, BILIDIR, IBILI in the last 72 hours. Sedimentation Rate No results for input(s): ESRSEDRATE in the last 72 hours. C-Reactive Protein No results for input(s): CRP in the last 72 hours.  Microbiology:  Studies/Results: Dg Chest Port 1 View  Result Date: 01/08/2019 CLINICAL DATA:  PICC placement EXAM: PORTABLE CHEST 1 VIEW COMPARISON:  Chest CT 5 days ago FINDINGS: Right upper extremity PICC with tip at the upper SVC. No displacement of dual-chamber pacer leads from the left. Bilateral pneumonia. Hyperinflation. No effusion or pneumothorax. Stable heart size.  IMPRESSION: 1. PICC with tip at the SVC. 2. Bilateral pneumonia. Electronically Signed   By: Monte Fantasia M.D.   On: 01/08/2019 05:57     Assessment/Plan: New onset seizures- he was on cefepime which can lower seizure threeshold- discontinued No seizures since admission  Pseudomonas bacteremia from pseudomonas related complicated UTi- ws on cefepime and now on Meropenem - will need antibiotics until 7/10   B/l hydroureteronephrosis from obstructive uropathy- has b/l PCN- urine clear in both tubes/bag Leucocytosis and crcl improving   AKI on CKD-improving  Anemia- got PRBC  Bladder ca   ID will sign off- call if needed

## 2019-01-08 NOTE — TOC Transition Note (Signed)
Transition of Care [facility]) - CM/SW Discharge Note   Patient Details  Name: Blake [name] MRN: [id number] Date of Birth: 01-12-1923  [facility]) CM/SW Contact:  Keyaria Lawson, Lenice Llamas Phone Number: [phone]  01/08/2019, 5:03 PM   Clinical Narrative: Clinical Social Worker (CSW) contacted [place] patient's daughter at her request. CSW made daughter aware that patient will not D/C today. [facility] agency representative is aware of IV ABX need. CSW will continue to follow and assist as needed.        Barriers to Discharge: Continued Medical Work up   Patient Goals and CMS Choice Patient states their goals for this hospitalization and ongoing recovery are:: to get back home with home health CMS Medicare.gov Compare Post Acute Care list provided to:: Patient Represenative (must comment)(son and daughter) Choice offered to / list presented to : Adult Children  Discharge Placement                       Discharge Plan and Services In-house Referral: Hospice / Palliative Care Discharge Planning Services: CM Consult Post Acute Care Choice: Resumption of Svcs/PTA Provider                    HH Arranged: RN, Nurse's Aide HH Agency: [place] ([facility]) Date HH Agency Contacted: 01/07/19 Time [place]: 0809 Representative spoke with at [place]: [place] ([place]) Interventions     Readmission Risk Interventions Readmission Risk Prevention Plan 12/30/2018 12/25/2018 11/24/2018  Transportation Screening Complete Complete Complete  PCP or Specialist Appt within 3-5 Days - - Complete  HRI or [place] - Complete Complete  Social Work Consult for [place] Planning/Counseling - Complete (No Data)  Palliative Care Screening - - Not Applicable  Medication Review Press photographer) Complete Complete Complete  HRI or Home Care Consult Complete - -  Some recent data might be hidden

## 2019-01-09 LAB — CBC
HCT: 23.5 % — ABNORMAL LOW (ref 39.0–52.0)
Hemoglobin: 7.3 g/dL — ABNORMAL LOW (ref 13.0–17.0)
MCH: 26.8 pg (ref 26.0–34.0)
MCHC: 31.1 g/dL (ref 30.0–36.0)
MCV: 86.4 fL (ref 80.0–100.0)
Platelets: 124 10*3/uL — ABNORMAL LOW (ref 150–400)
RBC: 2.72 MIL/uL — ABNORMAL LOW (ref 4.22–5.81)
RDW: 15.9 % — ABNORMAL HIGH (ref 11.5–15.5)
WBC: 8.9 10*3/uL (ref 4.0–10.5)
nRBC: 0 % (ref 0.0–0.2)

## 2019-01-09 LAB — BASIC METABOLIC PANEL
Anion gap: 7 (ref 5–15)
BUN: 60 mg/dL — ABNORMAL HIGH (ref 8–23)
CO2: 26 mmol/L (ref 22–32)
Calcium: 8.4 mg/dL — ABNORMAL LOW (ref 8.9–10.3)
Chloride: 109 mmol/L (ref 98–111)
Creatinine, Ser: 3.79 mg/dL — ABNORMAL HIGH (ref 0.61–1.24)
GFR calc Af Amer: 15 mL/min — ABNORMAL LOW (ref >60)
GFR calc non Af Amer: 13 mL/min — ABNORMAL LOW (ref >60)
Glucose, Bld: 150 mg/dL — ABNORMAL HIGH (ref 70–99)
Potassium: 4 mmol/L (ref 3.5–5.1)
Sodium: 142 mmol/L (ref 135–145)

## 2019-01-09 LAB — RENAL FUNCTION PANEL
Albumin: 2.3 g/dL — ABNORMAL LOW (ref 3.5–5.0)
Anion gap: 6 (ref 5–15)
BUN: 58 mg/dL — ABNORMAL HIGH (ref 8–23)
CO2: 27 mmol/L (ref 22–32)
Calcium: 8.3 mg/dL — ABNORMAL LOW (ref 8.9–10.3)
Chloride: 109 mmol/L (ref 98–111)
Creatinine, Ser: 3.83 mg/dL — ABNORMAL HIGH (ref 0.61–1.24)
GFR calc Af Amer: 15 mL/min — ABNORMAL LOW (ref >60)
GFR calc non Af Amer: 13 mL/min — ABNORMAL LOW (ref >60)
Glucose, Bld: 150 mg/dL — ABNORMAL HIGH (ref 70–99)
Phosphorus: 3.6 mg/dL (ref 2.5–4.6)
Potassium: 4 mmol/L (ref 3.5–5.1)
Sodium: 142 mmol/L (ref 135–145)

## 2019-01-09 LAB — GLUCOSE, CAPILLARY
Glucose-Capillary: 141 mg/dL — ABNORMAL HIGH (ref 70–99)
Glucose-Capillary: 173 mg/dL — ABNORMAL HIGH (ref 70–99)
Glucose-Capillary: 141 mg/dL — ABNORMAL HIGH (ref 70–99)

## 2019-01-09 MED ORDER — MEROPENEM IV (FOR PTA / DISCHARGE USE ONLY): 500.0000 mg | Freq: Two times a day (BID) | INTRAVENOUS | 0 refills | Status: AC

## 2019-01-09 MED ORDER — ALTEPLASE 2 MG IJ SOLR
2.0000 mg | Freq: Once | INTRAMUSCULAR | Status: AC
  Administered 2019-01-09: 11:00:00 2 mg
  Filled 2019-01-09: qty 2

## 2019-01-09 NOTE — TOC Transition Note (Signed)
Transition of Care [facility]) - CM/SW Discharge Note   Patient Details  Name: Blake [name] MRN: [id number] Date of Birth: [birth year]  Transition of Care [facility]) CM/SW Contact:  Nereyda Bowler, Lenice Llamas Phone Number: [phone]  01/09/2019, 2:43 PM   Clinical Narrative:  Per [facility] agency representative the IV ABX will be delivered to patient's home tonight around 8 pm. Per [facility] representative they will start care tomorrow between 8 am and 10 am. Patient's daughter Brunetta Genera is aware of above and requested EMS for transport home today. RN will give patient's IV ABX dose tonight at 6 pm at [facility] and he will get his next IV ABX dose tomorrow morning with [place]. CSW completed EMS form. Corene Cornea and Pam are aware of D/C today. Patient will have outpatient palliative care at home. [facility] hospice liaison is aware of above. Brunetta Genera is agreeable to palliative care. Please reconsult if future social work needs arise. CSW signing off.     Final next level of care: [place] Barriers to Discharge: Continued Medical Work up   Patient Goals and CMS Choice Patient states their goals for this hospitalization and ongoing recovery are:: to get back home with home health CMS Medicare.gov Compare Post Acute Care list provided to:: (Patient had [place] prior to hospitalzation.) Choice offered to / list presented to : Adult Children  Discharge Placement                Patient to be transferred to facility by: [facility] Name of family member notified: Patient's daughter Brunetta Genera is aware of D/C today. Patient and family notified of of transfer: 01/09/19  Discharge Plan and Services In-house Referral: Hospice / Palliative Care Discharge Planning Services: CM Consult Post Acute Care Choice: Resumption of Svcs/PTA Provider                    HH Arranged: PT, IV Antibiotics HH Agency: [place]  ([facility]) Date HH Agency Contacted: 01/09/19 Time [place]: 0809 Representative spoke with at [place]: [place] ([place]) Interventions     Readmission Risk Interventions Readmission Risk Prevention Plan 12/30/2018 12/25/2018 11/24/2018  Transportation Screening Complete Complete Complete  PCP or Specialist Appt within 3-5 Days - - Complete  HRI or [place] - Complete Complete  Social Work Consult for [place] Planning/Counseling - Complete (No Data)  Palliative Care Screening - - Not Applicable  Medication Review Press photographer) Complete Complete Complete  HRI or Home Care Consult Complete - -  Some recent data might be hidden

## 2019-01-09 NOTE — Progress Notes (Signed)
[facility]  ROUNDING NOTE   Subjective:  Patient still remains lethargic. Renal function appears to have improved a bit. Good urine output noted.   Objective:  Vital signs in last 24 hours:  Temp:  [98.4 F (36.9 C)-99 F (37.2 C)] 98.7 F (37.1 C) (06/30 1437) Pulse Rate:  [69-93] 83 (06/30 1437) Resp:  [18-24] 19 (06/30 1437) BP: (116-143)/(74-83) 116/74 (06/30 1437) SpO2:  [93 %-99 %] 95 % (06/30 1437)  Weight change:  Filed Weights   01/03/19 1222  Weight: 91.8 kg    Intake/Output: I/O last 3 completed shifts: In: 735 [P.O.:240; I.V.:26; Other:550; IV Piggyback:104] Out: 2325 [Urine:2325]   Intake/Output this shift:  Total I/O In: 231.2 [I.V.:131.2; IV Piggyback:100] Out: 625 [Urine:625]  Physical Exam: General: NAD, frail, elderly  Head: Moist oral mucous membranes  Eyes: Anicteric  Neck: Supple   Lungs:  Clear to auscultation, normal effort  Heart: Irregular, no rubs  Abdomen:  Soft, nontender  Extremities: trace  peripheral edema.  Neurologic: Lethargic    GU Bilateral percutaneous nephrostomy tube    Basic Metabolic Panel: Recent Labs  Lab 01/03/19 0444  01/05/19 0515 01/06/19 0459 01/07/19 0525 01/08/19 0549 01/09/19 0524  NA 139  --  137 140 143 143 142  142  K 4.3  --  4.0 4.0 4.2 4.3 4.0  4.0  CL 103  --  104 108 111 113* 109  109  CO2 22  --  21* 22 23 22 27  26   GLUCOSE 203*  --  162* 149* 171* 173* 150*  150*  BUN 71*  --  74* 68* 65* 65* 58*  60*  CREATININE 4.85*   < > 5.29* 4.78* 4.37* 4.18* 3.83*  3.79*  CALCIUM 8.4*  --  8.2* 8.3* 8.4* 8.3* 8.3*  8.4*  MG 1.7  --   --   --   --   --   --   PHOS  --   --   --   --   --   --  3.6   < > = values in this interval not displayed.    Liver Function Tests: Recent Labs  Lab 01/02/19 2014 01/09/19 0524  AST 25  --   ALT 20  --   ALKPHOS 63  --   BILITOT 0.3  --   PROT 8.2*  --   ALBUMIN 3.1* 2.3*   Recent Labs  Lab 01/02/19 2014  LIPASE 47   No  results for input(s): AMMONIA in the last 168 hours.  CBC: Recent Labs  Lab 01/02/19 2014  01/04/19 0720 01/05/19 0515 01/06/19 0459 01/08/19 0549 01/09/19 0524  WBC 9.3   < > 18.6* 16.4* 12.3* 9.1 8.9  NEUTROABS 6.7  --   --   --   --  7.4  --   HGB 7.7*   < > 7.3* 7.3* 7.4* 7.4* 7.3*  HCT 25.4*   < > 24.2* 23.5* 23.8* 23.8* 23.5*  MCV 86.7   < > 89.0 87.0 86.2 86.9 86.4  PLT 144*   < > 119* 106* 117* 124* 124*   < > = values in this interval not displayed.    Cardiac Enzymes: No results for input(s): CKTOTAL, CKMB, CKMBINDEX, TROPONINI in the last 168 hours.  BNP: Invalid input(s): POCBNP  CBG: Recent Labs  Lab 01/08/19 1139 01/08/19 1707 01/08/19 2055 01/09/19 0741 01/09/19 1148  GLUCAP 186* 148* 174* 141* 173*    Microbiology: Results for orders placed or  performed during the hospital encounter of 01/02/19  Blood Culture (routine x 2)     Status: None   Collection Time: 01/02/19  8:07 PM   Specimen: BLOOD  Result Value Ref Range Status   Specimen Description BLOOD BLOOD RIGHT HAND  Final   Special Requests   Final    BOTTLES DRAWN AEROBIC AND ANAEROBIC Blood Culture results may not be optimal due to an excessive volume of blood received in culture bottles   Culture   Final    NO GROWTH 5 DAYS Performed at [facility], [address] [place], [place] [address]    Report Status 01/07/2019 FINAL  Final  Blood Culture (routine x 2)     Status: None   Collection Time: 01/02/19  8:12 PM   Specimen: BLOOD  Result Value Ref Range Status   Specimen Description BLOOD BLOOD LEFT HAND  Final   Special Requests   Final    BOTTLES DRAWN AEROBIC AND ANAEROBIC Blood Culture adequate volume   Culture   Final    NO GROWTH 5 DAYS Performed at [facility], [address] [place],  [address]    Report Status 01/07/2019 FINAL  Final  SARS Coronavirus 2 (CEPHEID- Performed in [facility] hospital lab), Hosp Order     Status: None    Collection Time: 01/02/19  8:52 PM   Specimen: Nasopharyngeal Swab  Result Value Ref Range Status   SARS Coronavirus 2 NEGATIVE NEGATIVE Final    Comment: (NOTE) If result is NEGATIVE SARS-CoV-2 target nucleic acids are NOT DETECTED. The SARS-CoV-2 RNA is generally detectable in upper and lower  respiratory specimens during the acute phase of infection. The lowest  concentration of SARS-CoV-2 viral copies this assay can detect is 250  copies / mL. A negative result does not preclude SARS-CoV-2 infection  and should not be used as the sole basis for treatment or other  patient management decisions.  A negative result may occur with  improper specimen collection / handling, submission of specimen other  than nasopharyngeal swab, presence of viral mutation(s) within the  areas targeted by this assay, and inadequate number of viral copies  (<250 copies / mL). A negative result must be combined with clinical  observations, patient history, and epidemiological information. If result is POSITIVE SARS-CoV-2 target nucleic acids are DETECTED. The SARS-CoV-2 RNA is generally detectable in upper and lower  respiratory specimens dur ing the acute phase of infection.  Positive  results are indicative of active infection with SARS-CoV-2.  Clinical  correlation with patient history and other diagnostic information is  necessary to determine patient infection status.  Positive results do  not rule out bacterial infection or co-infection with other viruses. If result is PRESUMPTIVE POSTIVE SARS-CoV-2 nucleic acids MAY BE PRESENT.   A presumptive positive result was obtained on the submitted specimen  and confirmed on repeat testing.  While 2019 novel coronavirus  (SARS-CoV-2) nucleic acids may be present in the submitted sample  additional confirmatory testing may be necessary for epidemiological  and / or clinical management purposes  to differentiate between  SARS-CoV-2 and other Sarbecovirus  currently known to infect humans.  If clinically indicated additional testing with an alternate test  methodology [phone]) is advised. The SARS-CoV-2 RNA is generally  detectable in upper and lower respiratory sp ecimens during the acute  phase of infection. The expected result is Negative. Fact Sheet for Patients:  StrictlyIdeas.no Fact Sheet for Healthcare Providers: BankingDealers.co.za This test is not  yet approved or cleared by the [place] and has been authorized for detection and/or diagnosis of SARS-CoV-2 by FDA under an Emergency Use Authorization (EUA).  This EUA will remain in effect (meaning this test can be used) for the duration of the COVID-19 declaration under Section 564(b)(1) of the Act, 21 U.S.C. section 360bbb-3(b)(1), unless the authorization is terminated or revoked sooner. Performed at [facility], [address], [place], [place] [address]   Urine culture     Status: None   Collection Time: 01/02/19  8:52 PM   Specimen: Urine, Random  Result Value Ref Range Status   Specimen Description   Final    URINE, RANDOM Performed at [facility], [address], [place], [place] [address]    Special Requests   Final    NONE Performed at [facility], [address], [place], [place] [address]    Culture   Final    NO GROWTH Performed at [facility], [place] [address], [place], [place] [address]    Report Status 01/03/2019 FINAL  Final  MRSA PCR Screening     Status: None   Collection Time: 01/03/19  8:59 PM   Specimen: Nasal Mucosa; Nasopharyngeal  Result Value Ref Range Status   MRSA by PCR NEGATIVE NEGATIVE Final    Comment:        The GeneXpert MRSA Assay (FDA approved for NASAL specimens only), is one component of a comprehensive MRSA colonization surveillance program. It is not intended to diagnose MRSA infection nor to guide or monitor treatment for MRSA  infections. Performed at [facility], [address] [place],  [address]     Coagulation Studies: No results for input(s): LABPROT, INR in the last 72 hours.  Urinalysis: No results for input(s): COLORURINE, LABSPEC, PHURINE, GLUCOSEU, HGBUR, BILIRUBINUR, KETONESUR, PROTEINUR, UROBILINOGEN, NITRITE, LEUKOCYTESUR in the last 72 hours.  Invalid input(s): APPERANCEUR    Imaging: Dg Chest Port 1 View  Result Date: 01/08/2019 CLINICAL DATA:  PICC placement EXAM: PORTABLE CHEST 1 VIEW COMPARISON:  Chest CT 5 days ago FINDINGS: Right upper extremity PICC with tip at the upper SVC. No displacement of dual-chamber pacer leads from the left. Bilateral pneumonia. Hyperinflation. No effusion or pneumothorax. Stable heart size. IMPRESSION: 1. PICC with tip at the SVC. 2. Bilateral pneumonia. Electronically Signed   By: Monte Fantasia M.D.   On: 01/08/2019 05:57     Medications:   . sodium chloride Stopped (01/09/19 1043)  . meropenem (MERREM) IV Stopped (01/09/19 0858)   . aspirin EC  81 mg Oral Daily  . feeding supplement (GLUCERNA SHAKE)  237 mL Oral BID BM  . ferrous sulfate  325 mg Oral Daily  . finasteride  5 mg Oral Daily  . insulin aspart  0-9 Units Subcutaneous TID WC  . linagliptin  5 mg Oral Daily  . metoprolol tartrate  37.5 mg Oral BID  . multivitamin-lutein   Oral BID  . pantoprazole  40 mg Oral Daily  . rosuvastatin  5 mg Oral QPM  . sodium bicarbonate  650 mg Oral BID  . tamsulosin  0.4 mg Oral Daily   sodium chloride, acetaminophen **OR** acetaminophen, LORazepam, magnesium hydroxide, ondansetron **OR** ondansetron (ZOFRAN) IV  Assessment/ Plan:  [name] is a 85 y.o. black male with high-grade bladder cancer, incompletely resected tumor, severe bilateral hydronephrosis, history of prostate cancer, recurrent UTIs, atrial fibrillation, pacemaker/defibrillator, diabetes mellitus type II who is admitted to [facility] on  01/02/2019 for bilateral  hydronephrosis  1. Acute renal failure with obstructive uropathy, bilateral hydronephrosis. On Chronic kidney disease stage IV.  Baseline creatinine 2.22, GFR of 28; 09/13/2018 Status post left percutaneous nephrostomy tube placement by IR on 6/16. Right placed on on 6/19.  -Right Nephrostogram done on June 23 shows the tube is in the correct position.   -Patient continues to have good urine output.  Urine output was 2.3 liters over the preceding 24 hours.  Creatinine also down to 3.8.  He will need continued monitoring as an outpatient.  2.  Anemia of chronic kidney disease:  Lab Results  Component Value Date   HGB 7.3 (L) 01/09/2019  not candidate for Procrit due to malignancy  3. Pseudomonas sepsis from urinary source 6/15 Iv meropenem till 01/19/19 as recommended by ID Patient has a right arm PICC line   LOS: 6 Shereen Marton 6/30/20203:40 PM

## 2019-01-09 NOTE — Discharge Summary (Addendum)
[name] at [facility], 85 y.o., DOB [birth year], MRN [id number]. Admission date: 01/02/2019 Discharge Date 01/09/2019 Primary MD Adin Hector, MD Admitting Physician Christel Mormon, MD  Admission Diagnosis  Seizure [facility]) [R56.9] Disorientation [R41.0] CKD (chronic kidney disease) stage 5, GFR less than 15 ml/min ([place]) [N18.5]  Discharge Diagnosis   Active Problems:   Seizure ([place]) new onset due to seizure Bilateral lower lobe pneumonia due to aspiration Acute kidney injury on chronic kidney disease stage IV Essential hypertension Diabetes type 2 Acute on chronic anemia secondary anemia of chronic disease as well as bleeding from nephrostomy Hyperlipidemia BPH  Hospital Course  Blake [name] is an 85 y.o. male with a known history of AAA, CHF, DM, HTN, PPM, HLD who was discharged yesterday after being admitted here for Pseudomonas bacteremia with urinary obstruction status post bilateral nephrostomy and PICC line placement for which he has been getting IV cefepime.  Patient was discharged and at home he was there brieflyhe had a seizure episode Upon arrival of EMS he had another seizure episode and has been postictal since then till arrival to the ER.  Patient again was were noted to have worsening renal function.  He was given IV fluids.  He was seen in consultation by neurology and infectious disease.  It was felt that his seizure could be related to cefepime therefore he was switched over to meropenem.  Patient was provided supportive care.  Discussed discussions were held with patient and his family members regarding overall poor prognosis with multiple admissions.  Patient is 9 and he still remains full code.     Palliative care to follow patient on discharge      Consults  None  Significant Tests:  See full reports for all details     Ct Abdomen Pelvis Wo Contrast  Result Date: 01/03/2019 CLINICAL DATA:  85 year old male  with persistent cough. Recent nephrostomy placement. EXAM: CT CHEST, ABDOMEN AND PELVIS WITHOUT CONTRAST TECHNIQUE: Multidetector CT imaging of the chest, abdomen and pelvis was performed following the standard protocol without IV contrast. COMPARISON:  CT of the abdomen pelvis dated 01/01/2019 FINDINGS: Evaluation of this exam is limited in the absence of intravenous contrast. Evaluation is also limited due to streak artifact caused by patient's arms. CT CHEST FINDINGS Cardiovascular: There is no cardiomegaly or pericardial effusion. Multi vessel coronary vascular calcification. Left pectoral pacemaker device. Advanced atherosclerotic calcification of the thoracic aorta. No aneurysmal dilatation. The central pulmonary arteries are grossly unremarkable. Right-sided PICC with tip in the central SVC. Mediastinum/Nodes: No hilar or mediastinal adenopathy. There is a small hiatal hernia. The esophagus is slightly patulous and contains debris. No mediastinal fluid collection. Lungs/Pleura: Small right pleural effusion. Bilateral lower lobe predominant airspace densities, new since the prior CT most consistent with pneumonia. Aspiration is not excluded. Clinical correlation is recommended. There is a 9 mm right upper lobe nodule. No pneumothorax. Small amount of debris or mucous material noted in the left mainstem bronchus. Musculoskeletal: Osteopenia with degenerative changes of the spine. Bilateral carotid bulb calcified plaques. No acute osseous pathology. Multilevel anterior bridging osteophyte and syndesmophyte may represent DISH or related to ankylosing spondylitis. No acute osseous pathology. CT ABDOMEN PELVIS FINDINGS No intra-abdominal free air or free fluid. Hepatobiliary: No focal liver abnormality is seen. No gallstones, gallbladder wall thickening, or biliary dilatation. Pancreas: Unremarkable. No pancreatic ductal dilatation or surrounding inflammatory changes. Spleen: Normal in size without focal  abnormality. Adrenals/Urinary Tract: The  adrenal glands are unremarkable. Similar position of bilateral nephrostomy tubes. There is no hydronephrosis on either side. There is high attenuating content in the right renal collecting system and pelvis extending down in the right ureter suspicious for blood product. Correlation with urinalysis recommended. There is no hydronephrosis on either side. The urinary bladder is collapsed. Stomach/Bowel: There is extensive sigmoid diverticulosis and scattered colonic diverticula without active inflammatory changes. No bowel obstruction or active inflammation. No evidence of acute appendicitis. Vascular/Lymphatic: Advanced aortoiliac atherosclerotic disease. Infrarenal abdominal aortic aneurysm measures up to 5.5 cm in greatest diameter. This is essentially unchanged compared to the prior CT by my measurements. Bilateral common iliac artery aneurysms measure 2.6 cm on the right and 2.4 cm on the left. Evaluation of the vasculature is limited in the absence of intravenous contrast. The IVC is unremarkable. No portal venous gas. There is no adenopathy. Reproductive: Prostate brachytherapy seeds. Other: None Musculoskeletal: Osteopenia with degenerative changes of the spine. No acute osseous pathology. IMPRESSION: 1. Interval development of bilateral lower lobe predominant airspace densities most consistent with pneumonia and possible aspiration. Clinical correlation is recommended. Small right pleural effusion. 2. High attenuating content in the right renal collecting system and right ureter suspicious for blood product. Correlation with urinalysis recommended. No hydronephrosis on either side. 3. Stable position of bilateral nephrostomy tubes. 4. Extensive sigmoid and colonic diverticulosis without active inflammatory changes. No bowel obstruction. 5. Advanced aortoiliac atherosclerotic disease with a 5.5 cm infrarenal abdominal aortic aneurysm and bilateral common iliac artery  aneurysms. Recommend followup by abdomen and pelvis CTA in 3-6 months, and vascular surgery referral/consultation if not already obtained. This recommendation follows ACR consensus guidelines: White Paper of the ACR Incidental Findings Committee II on Vascular Findings. J Am Coll Radiol 2013; 10:789-794. Electronically Signed   By: Anner Crete M.D.   On: 01/03/2019 00:27   Ct Abdomen Pelvis Wo Contrast  Result Date: 01/01/2019 CLINICAL DATA:  Status post right nephrostomy EXAM: CT ABDOMEN AND PELVIS WITHOUT CONTRAST TECHNIQUE: Multidetector CT imaging of the abdomen and pelvis was performed following the standard protocol without IV contrast. COMPARISON:  CT abdomen pelvis, 12/25/2018 FINDINGS: Lower chest: Improved bibasilar pleural effusions, now minimal. Associated scarring or atelectasis. Coronary artery calcifications. Hepatobiliary: No solid liver abnormality is seen. No gallstones, gallbladder wall thickening, or biliary dilatation. Pancreas: Unremarkable. No pancreatic ductal dilatation or surrounding inflammatory changes. Spleen: Normal in size without significant abnormality. Adrenals/Urinary Tract: Adrenal glands are unremarkable. Interval placement of bilateral percutaneous nephrostomy tubes, with formed pigtails in the renal pelves and interval resolution of previously seen hydronephrosis. The urinary bladder remains decompressed by a Foley catheter. Brachytherapy pellets. Stomach/Bowel: Stomach is within normal limits. No evidence of bowel wall thickening, distention, or inflammatory changes. Generally large burden of stool in the colon with large stool ball in rectum. Pancolonic diverticulosis. 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 No enlarged abdominal or pelvic lymph nodes. Reproductive: Brachytherapy pellets. Other: No abdominal wall hernia or abnormality. No abdominopelvic ascites. Musculoskeletal: Disc degenerative  disease and ankylosis of the lumbar spine. IMPRESSION: 1. Interval placement of bilateral percutaneous nephrostomy tubes, with formed pigtails in the renal pelves and interval resolution of previously seen hydronephrosis. The urinary bladder remains decompressed by a Foley catheter. 2.  Improved bilateral pleural effusions, now minimal. 3. Other chronic, incidental, and postoperative findings as detailed above. Electronically Signed   By: Eddie Candle M.D.   On: 01/01/2019 14:50   Ct Abdomen Pelvis Wo Contrast  Result Date: 12/25/2018 CLINICAL DATA:  Shortness of breath and cough, weakness. Bilateral hydronephrosis EXAM: CT ABDOMEN AND PELVIS WITHOUT CONTRAST TECHNIQUE: Multidetector CT imaging of the abdomen and pelvis was performed following the standard protocol without IV contrast. COMPARISON:  CT abdomen dated 11/06/2018. Chest x-ray from earlier same day. FINDINGS: Lower chest: Bibasilar pleural effusions, at least moderate in size, incompletely imaged with associated compressive atelectasis. Hepatobiliary: Stable small hypodensity within the RIGHT liver lobe, too small to definitively characterize but most likely a cyst. No new liver findings. Gallbladder appears normal. No bile duct dilatation seen. Pancreas: Unremarkable. No pancreatic ductal dilatation or surrounding inflammatory changes. Spleen: Normal in size without focal abnormality. Adrenals/Urinary Tract: Bilateral hydronephrosis, moderate to severe in degree, not significantly changed compared to the previous study. Stable bilateral hydroureter. Bladder is decompressed by Foley catheter. Stomach/Bowel: No dilated large or small bowel loops. Stomach is unremarkable, partially decompressed. Moderate amount of stool in the rectal vault with commensurate distension. Diffuse colonic diverticulosis but no focal inflammatory change to suggest acute diverticulitis. Appendix is not seen but there are no inflammatory changes in the RIGHT lower quadrant to  suggest acute appendicitis. Vascular/Lymphatic: Infrarenal abdominal aortic aneurysm, measuring 5.2 x 5 cm, stable in the short-term interval. Extensive atherosclerosis of the abdominal aorta and branch vessels. Extensive atherosclerosis of the pelvic vasculature. No acute findings. No enlarged lymph nodes seen. Reproductive: Brachytherapy seeds in place. Other: No free fluid or abscess collection seen. No free intraperitoneal air. Musculoskeletal: No acute or suspicious osseous finding. IMPRESSION: 1. New bibasilar pleural effusions, at least moderate in size, incompletely imaged, with associated compressive atelectasis. 2. Bilateral hydronephrosis and hydroureter, moderate to severe in degree, not significantly changed compared to the previous study. Bladder is decompressed by Foley catheter. 3. Colonic diverticulosis without evidence of acute diverticulitis. 4. Infrarenal abdominal aortic aneurysm, measuring 5.2 cm, stable in the short-term interval. No periaortic hemorrhage or inflammation. Recommend followup by abdomen and pelvis CTA in 3-6 months, and vascular surgery referral/consultation if not already obtained. This recommendation follows ACR consensus guidelines: White Paper of the ACR Incidental Findings Committee II on Vascular Findings. J Am Coll Radiol 2013; 10:789-794. Electronically Signed   By: Franki Cabot M.D.   On: 12/25/2018 10:33   Ct Head Wo Contrast  Result Date: 01/03/2019 CLINICAL DATA:  Seizure. EXAM: CT HEAD WITHOUT CONTRAST TECHNIQUE: Contiguous axial images were obtained from the base of the skull through the vertex without intravenous contrast. COMPARISON:  November 05, 2018. FINDINGS: Brain: No evidence of acute infarction, hemorrhage, hydrocephalus, extra-axial collection or mass lesion/mass effect. Volume loss and chronic microvascular ischemic changes are noted. Vascular: No hyperdense vessel or unexpected calcification. Skull: Normal. Negative for fracture or focal lesion.  Sinuses/Orbits: No acute finding. Other: None. IMPRESSION: 1. No acute intracranial abnormality. 2. Again identified are chronic microvascular ischemic changes and extensive volume loss, similar to prior study dated November 05, 2018. Electronically Signed   By: Constance Holster M.D.   On: 01/03/2019 00:32   Ct Chest Wo Contrast  Result Date: 01/03/2019 CLINICAL DATA:  85 year old male with persistent cough. Recent nephrostomy placement. EXAM: CT CHEST, ABDOMEN AND PELVIS WITHOUT CONTRAST TECHNIQUE: Multidetector CT imaging of the chest, abdomen and pelvis was performed following the standard protocol without IV contrast. COMPARISON:  CT of the abdomen pelvis dated 01/01/2019 FINDINGS: Evaluation of this exam is limited in the absence of intravenous contrast. Evaluation is also limited due to streak artifact caused by patient's arms. CT CHEST FINDINGS Cardiovascular: There is no  cardiomegaly or pericardial effusion. Multi vessel coronary vascular calcification. Left pectoral pacemaker device. Advanced atherosclerotic calcification of the thoracic aorta. No aneurysmal dilatation. The central pulmonary arteries are grossly unremarkable. Right-sided PICC with tip in the central SVC. Mediastinum/Nodes: No hilar or mediastinal adenopathy. There is a small hiatal hernia. The esophagus is slightly patulous and contains debris. No mediastinal fluid collection. Lungs/Pleura: Small right pleural effusion. Bilateral lower lobe predominant airspace densities, new since the prior CT most consistent with pneumonia. Aspiration is not excluded. Clinical correlation is recommended. There is a 9 mm right upper lobe nodule. No pneumothorax. Small amount of debris or mucous material noted in the left mainstem bronchus. Musculoskeletal: Osteopenia with degenerative changes of the spine. Bilateral carotid bulb calcified plaques. No acute osseous pathology. Multilevel anterior bridging osteophyte and syndesmophyte may represent DISH  or related to ankylosing spondylitis. No acute osseous pathology. CT ABDOMEN PELVIS FINDINGS No intra-abdominal free air or free fluid. Hepatobiliary: No focal liver abnormality is seen. No gallstones, gallbladder wall thickening, or biliary dilatation. Pancreas: Unremarkable. No pancreatic ductal dilatation or surrounding inflammatory changes. Spleen: Normal in size without focal abnormality. Adrenals/Urinary Tract: The adrenal glands are unremarkable. Similar position of bilateral nephrostomy tubes. There is no hydronephrosis on either side. There is high attenuating content in the right renal collecting system and pelvis extending down in the right ureter suspicious for blood product. Correlation with urinalysis recommended. There is no hydronephrosis on either side. The urinary bladder is collapsed. Stomach/Bowel: There is extensive sigmoid diverticulosis and scattered colonic diverticula without active inflammatory changes. No bowel obstruction or active inflammation. No evidence of acute appendicitis. Vascular/Lymphatic: Advanced aortoiliac atherosclerotic disease. Infrarenal abdominal aortic aneurysm measures up to 5.5 cm in greatest diameter. This is essentially unchanged compared to the prior CT by my measurements. Bilateral common iliac artery aneurysms measure 2.6 cm on the right and 2.4 cm on the left. Evaluation of the vasculature is limited in the absence of intravenous contrast. The IVC is unremarkable. No portal venous gas. There is no adenopathy. Reproductive: Prostate brachytherapy seeds. Other: None Musculoskeletal: Osteopenia with degenerative changes of the spine. No acute osseous pathology. IMPRESSION: 1. Interval development of bilateral lower lobe predominant airspace densities most consistent with pneumonia and possible aspiration. Clinical correlation is recommended. Small right pleural effusion. 2. High attenuating content in the right renal collecting system and right ureter suspicious  for blood product. Correlation with urinalysis recommended. No hydronephrosis on either side. 3. Stable position of bilateral nephrostomy tubes. 4. Extensive sigmoid and colonic diverticulosis without active inflammatory changes. No bowel obstruction. 5. Advanced aortoiliac atherosclerotic disease with a 5.5 cm infrarenal abdominal aortic aneurysm and bilateral common iliac artery aneurysms. Recommend followup by abdomen and pelvis CTA in 3-6 months, and vascular surgery referral/consultation if not already obtained. This recommendation follows ACR consensus guidelines: White Paper of the ACR Incidental Findings Committee II on Vascular Findings. J Am Coll Radiol 2013; 10:789-794. Electronically Signed   By: Anner Crete M.D.   On: 01/03/2019 00:27   [place] Intraoperative  Result Date: 01/01/2019 INDICATION: 85 year old male with a history of bilateral ureteral occlusion EXAM: IR NEPHROSTOMY PLACEMENT RIGHT; ULTRAOUND INTRAOPERATIVE - NRPT [facility] COMPARISON:  None. MEDICATIONS: None ANESTHESIA/SEDATION: Fentanyl 50 mcg IV; Versed 1 mg IV Moderate Sedation Time:  20 minutes The patient was continuously monitored during the procedure by the interventional radiology nurse under my direct supervision. CONTRAST:  10 cc-administered into the collecting system(s) FLUOROSCOPY TIME:  Fluoroscopy Time: 3 minutes 0 seconds COMPLICATIONS: None PROCEDURE: Informed written  consent was obtained from the patient's family after a thorough discussion of the procedural risks, benefits and alternatives. All questions were addressed. Maximal Sterile Barrier Technique was utilized including caps, mask, sterile gowns, sterile gloves, sterile drape, hand hygiene and skin antiseptic. A timeout was performed prior to the initiation of the procedure. Patient positioned prone position on the fluoroscopy table. Ultrasound survey of the right flank was performed with images stored and sent to PACs. The patient was then prepped and draped in  the usual sterile fashion. 1% lidocaine was used to anesthetize the skin and subcutaneous tissues for local anesthesia. A Chiba needle was then used to access a posterior inferior calyx with ultrasound guidance. With spontaneous urine returned through the needle, passage of an 018 micro wire into the collecting system was performed under fluoroscopy. A small incision was made with an 11 blade scalpel, and the needle was removed from the wire. An Accustick system was then advanced over the wire into the collecting system under fluoroscopy. The metal stiffener and inner dilator were removed, and then a sample of fluid was aspirated through the 4 French outer sheath. Bentson wire was passed into the collecting system and the sheath removed. Ten French dilation of the soft tissues was performed. Using modified Seldinger technique, a 10 French pigtail catheter drain was placed over the Bentson wire. Wire and inner stiffener removed, and the pigtail was formed in the collecting system. Small amount of contrast confirmed position of the catheter. Patient tolerated the procedure well and remained hemodynamically stable throughout. No complications were encountered and no significant blood loss encountered IMPRESSION: Status post right percutaneous nephrostomy. Signed, Dulcy Fanny. Dellia Nims, RPVI [facility]iology Electronically Signed   By: Corrie Mckusick D.O.   On: 01/01/2019 07:42   [place] Intraoperative  Result Date: 12/26/2018 CLINICAL DATA:  Ultrasound was provided for use by the ordering physician, and a technical charge was applied by the performing facility.  No radiologist interpretation/professional services rendered.   Dg Chest Port 1 View  Result Date: 01/08/2019 CLINICAL DATA:  PICC placement EXAM: PORTABLE CHEST 1 VIEW COMPARISON:  Chest CT 5 days ago FINDINGS: Right upper extremity PICC with tip at the upper SVC. No displacement of dual-chamber pacer leads  from the left. Bilateral pneumonia. Hyperinflation. No effusion or pneumothorax. Stable heart size. IMPRESSION: 1. PICC with tip at the SVC. 2. Bilateral pneumonia. Electronically Signed   By: Monte Fantasia M.D.   On: 01/08/2019 05:57   Dg Chest Portable 1 View  Result Date: 01/02/2019 CLINICAL DATA:  85 year old male with cough. EXAM: PORTABLE CHEST 1 VIEW COMPARISON:  Chest radiograph dated 12/25/2018 FINDINGS: Right-sided PICC with tip over the mediastinum at the level of the aortic arch. There is diffuse chronic interstitial coarsening. Bibasilar linear atelectasis/scarring noted. There has been interval clearing of the previously seen bibasilar densities. No focal consolidation, pleural effusion, or pneumothorax. Stable cardiac silhouette. Left pectoral pacemaker device. Atherosclerotic calcification of the aorta. Osteopenia with degenerative changes of the spine. No acute osseous pathology. IMPRESSION: 1. No acute cardiopulmonary process. 2. Right-sided PICC with tip over the mediastinum at the level of the aortic arch. Electronically Signed   By: Anner Crete M.D.   On: 01/02/2019 20:41   Dg Chest Port 1 View  Result Date: 12/25/2018 CLINICAL DATA:  Shortness of breath. EXAM: PORTABLE CHEST 1 VIEW COMPARISON:  11/05/2018. FINDINGS: Cardiac pacer noted with lead tip over the right atrium right ventricle. Heart size stable. Bibasilar infiltrates/edema  and small bilateral pleural effusions. Bibasilar atelectasis. No pneumothorax. IMPRESSION: 1.  Cardiac pacer stable position. 2. Bibasilar infiltrates/edema and small bilateral pleural effusions. Findings have increased from prior exam. Electronically Signed   By: Marcello Moores  Register   On: 12/25/2018 07:07   Dg Nephrostogram Right Thru Existing Access  Result Date: 01/02/2019 INDICATION: Bilateral nephrostomies, right nephrostomy bloody output EXAM: RIGHT NEPHROSTOGRAM THROUGH EXISTING NEPHROSTOMY COMPARISON:  12/29/2018, 01/01/2019 MEDICATIONS:  None. ANESTHESIA/SEDATION: Moderate Sedation Time:  None. The patient was continuously monitored during the procedure by the interventional radiology nurse under my direct supervision. CONTRAST:  20 cc dilute Omnipaque 300-administered into the collecting system(s) FLUOROSCOPY TIME:  Fluoroscopy Time: 0 minutes 24 seconds (9 mGy). COMPLICATIONS: None immediate. PROCEDURE: Informed written consent was obtained from the patient after a thorough discussion of the procedural risks, benefits and alternatives. All questions were addressed. Maximal Sterile Barrier Technique was utilized including caps, mask, sterile gowns, sterile gloves, sterile drape, hand hygiene and skin antiseptic. A timeout was performed prior to the initiation of the procedure. Under sterile conditions, the existing right nephrostomy was injected with contrast. Fluoroscopic imaging performed. Nephrostomy retention loop in the renal pelvis in good position. Mild hydronephrosis noted. No evidence of contrast extravasation or communication to any vascular source. No obvious site of bleeding. IMPRESSION: Right nephrostomy in good position. No signs of active bleeding or contrast extravasation. Electronically Signed   By: Jerilynn Mages.  Shick M.D.   On: 01/02/2019 15:15   Ir Nephrostomy Placement Right  Result Date: 01/01/2019 INDICATION: 85 year old male with a history of bilateral ureteral occlusion EXAM: IR NEPHROSTOMY PLACEMENT RIGHT; ULTRAOUND INTRAOPERATIVE - NRPT [facility] COMPARISON:  None. MEDICATIONS: None ANESTHESIA/SEDATION: Fentanyl 50 mcg IV; Versed 1 mg IV Moderate Sedation Time:  20 minutes The patient was continuously monitored during the procedure by the interventional radiology nurse under my direct supervision. CONTRAST:  10 cc-administered into the collecting system(s) FLUOROSCOPY TIME:  Fluoroscopy Time: 3 minutes 0 seconds COMPLICATIONS: None PROCEDURE: Informed written consent was obtained from the patient's family after a thorough discussion  of the procedural risks, benefits and alternatives. All questions were addressed. Maximal Sterile Barrier Technique was utilized including caps, mask, sterile gowns, sterile gloves, sterile drape, hand hygiene and skin antiseptic. A timeout was performed prior to the initiation of the procedure. Patient positioned prone position on the fluoroscopy table. Ultrasound survey of the right flank was performed with images stored and sent to PACs. The patient was then prepped and draped in the usual sterile fashion. 1% lidocaine was used to anesthetize the skin and subcutaneous tissues for local anesthesia. A Chiba needle was then used to access a posterior inferior calyx with ultrasound guidance. With spontaneous urine returned through the needle, passage of an 018 micro wire into the collecting system was performed under fluoroscopy. A small incision was made with an 11 blade scalpel, and the needle was removed from the wire. An Accustick system was then advanced over the wire into the collecting system under fluoroscopy. The metal stiffener and inner dilator were removed, and then a sample of fluid was aspirated through the 4 French outer sheath. Bentson wire was passed into the collecting system and the sheath removed. Ten French dilation of the soft tissues was performed. Using modified Seldinger technique, a 10 French pigtail catheter drain was placed over the Bentson wire. Wire and inner stiffener removed, and the pigtail was formed in the collecting system. Small amount of contrast confirmed position of the catheter. Patient tolerated the procedure well and remained hemodynamically  stable throughout. No complications were encountered and no significant blood loss encountered IMPRESSION: Status post right percutaneous nephrostomy. Signed, Dulcy Fanny. Dellia Nims, RPVI V[facility] Interventional Radiology Specialists [facility] Radiology Electronically Signed   By: Corrie Mckusick D.O.   On: 01/01/2019 07:42   Ct Image  Guided Drainage By Percutaneous Catheter  Result Date: 12/26/2018 INDICATION: 85 year old male with a history of bilateral ureteral obstruction. EXAM: CT AND ULTRASOUND GUIDED DRAINAGE OF LEFT KIDNEY MEDICATIONS: The patient is currently admitted to the hospital and receiving intravenous antibiotics. The antibiotics were administered within an appropriate time frame prior to the initiation of the procedure. ANESTHESIA/SEDATION: 1.0 mg IV Versed 50 mcg IV Fentanyl Moderate Sedation Time:  21 minutes The patient was continuously monitored during the procedure by the interventional radiology nurse under my direct supervision. COMPLICATIONS: None TECHNIQUE: Informed written consent was obtained from the patient after a thorough discussion of the procedural risks, benefits and alternatives. All questions were addressed. Maximal Sterile Barrier Technique was utilized including caps, mask, sterile gowns, sterile gloves, sterile drape, hand hygiene and skin antiseptic. A timeout was performed prior to the initiation of the procedure. PROCEDURE: Informed written consent was obtained from the patient's family after a thorough discussion of the procedural risks, benefits and alternatives. All questions were addressed. Maximal Sterile Barrier Technique was utilized including caps, mask, sterile gowns, sterile gloves, sterile drape, hand hygiene and skin antiseptic. A timeout was performed prior to the initiation of the procedure. Patient positioned right decubitus position on the CT table. Scout CT was performed for planning purposes. Ultrasound survey of the left flank was performed with images stored and sent to PACs. The patient was then prepped and draped in the usual sterile fashion. 1% lidocaine was used to anesthetize the skin and subcutaneous tissues for local anesthesia. An 18 gauge trocar needle was then used to access a posterior inferior calyx with ultrasound guidance. With spontaneous urine returned through the  needle, 035 wire was passed into the collecting system. CT was performed to confirm location. A small incision was made with an 11 blade scalpel, and the needle was removed from the wire. Ten French dilation was performed. Ten [place] drain was then advanced over the wire into the collecting system. Wire was removed.  Catheter pigtail was locked.  CT was performed. Catheter was sutured to the skin and attached to gravity drainage. Patient tolerated the procedure well and remained hemodynamically stable throughout. No complications were encountered and no significant blood loss encountered FINDINGS: Scout CT in right decubitus position demonstrates persisting left-sided hydronephrosis. Images during the case demonstrate placement of 10 French drain through inferior calyx into the collecting system. IMPRESSION: Status post CT-guided placement of left-sided percutaneous nephrostomy. Signed, Dulcy Fanny. Dellia Nims, RPVI Vascular and Interventional Radiology Specialists [facility] Radiology PLAN: A single left-sided percutaneous access was performed given the need for positioning in the right decubitus position secondary to the patient's respiratory status. If need be, we may re-evaluate for possible right-sided percutaneous nephrostomy. Electronically Signed   By: Corrie Mckusick D.O.   On: 12/26/2018 11:30   [place] Ekg Site Rite  Result Date: 12/30/2018 If Site Rite image not attached, placement could not be confirmed due to current cardiac rhythm.      Today   Subjective:   Blake [name] patient doing better denies any complaints Objective:   Blood pressure 126/78, pulse 69, temperature 98.5 F (36.9 C), temperature source Oral, resp. rate 18, height 6\' 2"  (1.88 m), weight 91.8 kg, SpO2 96 %.  Marland Kitchen  Intake/Output Summary (Last 24 hours) at 01/09/2019 1330 Last data filed at 01/09/2019 1222 Gross per 24 hour  Intake 1046.05 ml  Output 1925 ml  Net -878.95 ml    Exam VITAL SIGNS: Blood pressure 126/78,  pulse 69, temperature 98.5 F (36.9 C), temperature source Oral, resp. rate 18, height 6\' 2"  (1.88 m), weight 91.8 kg, SpO2 96 %.  GENERAL:  85 y.o.-year-old patient lying in the bed with no acute distress.  EYES: Pupils equal, round, reactive to light and accommodation. No scleral icterus. Extraocular muscles intact.  HEENT: Head atraumatic, normocephalic. Oropharynx and nasopharynx clear.  NECK:  Supple, no jugular venous distention. No thyroid enlargement, no tenderness.  LUNGS: Normal breath sounds bilaterally, no wheezing, rales,rhonchi or crepitation. No use of accessory muscles of respiration.  CARDIOVASCULAR: S1, S2 normal. No murmurs, rubs, or gallops.  ABDOMEN: Soft, nontender, nondistended. Bowel sounds present. No organomegaly or mass.  EXTREMITIES: No pedal edema, cyanosis, or clubbing.  NEUROLOGIC: Cranial nerves II through XII are intact. Muscle strength 5/5 in all extremities. Sensation intact. Gait not checked.  PSYCHIATRIC: The patient is alert and oriented x 3.  SKIN: No obvious rash, lesion, or ulcer.   Data Review     CBC w Diff:  Lab Results  Component Value Date   WBC 8.9 01/09/2019   HGB 7.3 (L) 01/09/2019   HGB 11.2 (L) 06/16/2014   HCT 23.5 (L) 01/09/2019   HCT 34.8 (L) 06/16/2014   PLT 124 (L) 01/09/2019   PLT 150 06/16/2014   LYMPHOPCT 8 01/08/2019   LYMPHOPCT 13.3 06/16/2014   MONOPCT 7 01/08/2019   MONOPCT 9.2 06/16/2014   EOSPCT 2 01/08/2019   EOSPCT 2.2 06/16/2014   BASOPCT 1 01/08/2019   BASOPCT 1.0 06/16/2014   CMP:  Lab Results  Component Value Date   NA 142 01/09/2019   NA 142 01/09/2019   NA 141 12/21/2018   NA 137 06/16/2014   K 4.0 01/09/2019   K 4.0 01/09/2019   K 4.3 06/16/2014   CL 109 01/09/2019   CL 109 01/09/2019   CL 100 06/16/2014   CO2 26 01/09/2019   CO2 27 01/09/2019   CO2 27 06/16/2014   BUN 60 (H) 01/09/2019   BUN 58 (H) 01/09/2019   BUN 68 (H) 12/21/2018   BUN 29 (H) 06/16/2014   CREATININE 3.79 (H)  01/09/2019   CREATININE 3.83 (H) 01/09/2019   CREATININE 1.48 (H) 06/16/2014   PROT 8.2 (H) 01/02/2019   PROT 7.9 12/29/2012   ALBUMIN 2.3 (L) 01/09/2019   ALBUMIN 4.0 12/29/2012   BILITOT 0.3 01/02/2019   BILITOT 0.4 12/29/2012   ALKPHOS 63 01/02/2019   ALKPHOS 77 12/29/2012   AST 25 01/02/2019   AST 20 12/29/2012   ALT 20 01/02/2019   ALT 16 12/29/2012  .  Micro Results Recent Results (from the past 240 hour(s))  Blood Culture (routine x 2)     Status: None   Collection Time: 01/02/19  8:07 PM   Specimen: BLOOD  Result Value Ref Range Status   Specimen Description BLOOD BLOOD RIGHT HAND  Final   Special Requests   Final    BOTTLES DRAWN AEROBIC AND ANAEROBIC Blood Culture results may not be optimal due to an excessive volume of blood received in culture bottles   Culture   Final    NO GROWTH 5 DAYS Performed at [facility], [address] [place], [place] [address]    Report Status 01/07/2019 FINAL  Final  Blood Culture (routine x 2)     Status: None   Collection Time: 01/02/19  8:12 PM   Specimen: BLOOD  Result Value Ref Range Status   Specimen Description BLOOD BLOOD LEFT HAND  Final   Special Requests   Final    BOTTLES DRAWN AEROBIC AND ANAEROBIC Blood Culture adequate volume   Culture   Final    NO GROWTH 5 DAYS Performed at [facility], [address] [place], [place] [address]    Report Status 01/07/2019 FINAL  Final  SARS Coronavirus 2 (CEPHEID- Performed in [place] hospital lab), Hosp Order     Status: None   Collection Time: 01/02/19  8:52 PM   Specimen: Nasopharyngeal Swab  Result Value Ref Range Status   SARS Coronavirus 2 NEGATIVE NEGATIVE Final    Comment: (NOTE) If result is NEGATIVE SARS-CoV-2 target nucleic acids are NOT DETECTED. The SARS-CoV-2 RNA is generally detectable in upper and lower  respiratory specimens during the acute phase of infection. The lowest  concentration of SARS-CoV-2 viral copies this assay can  detect is 250  copies / mL. A negative result does not preclude SARS-CoV-2 infection  and should not be used as the sole basis for treatment or other  patient management decisions.  A negative result may occur with  improper specimen collection / handling, submission of specimen other  than nasopharyngeal swab, presence of viral mutation(s) within the  areas targeted by this assay, and inadequate number of viral copies  (<250 copies / mL). A negative result must be combined with clinical  observations, patient history, and epidemiological information. If result is POSITIVE SARS-CoV-2 target nucleic acids are DETECTED. The SARS-CoV-2 RNA is generally detectable in upper and lower  respiratory specimens dur ing the acute phase of infection.  Positive  results are indicative of active infection with SARS-CoV-2.  Clinical  correlation with patient history and other diagnostic information is  necessary to determine patient infection status.  Positive results do  not rule out bacterial infection or co-infection with other viruses. If result is PRESUMPTIVE POSTIVE SARS-CoV-2 nucleic acids MAY BE PRESENT.   A presumptive positive result was obtained on the submitted specimen  and confirmed on repeat testing.  While 2019 novel coronavirus  (SARS-CoV-2) nucleic acids may be present in the submitted sample  additional confirmatory testing may be necessary for epidemiological  and / or clinical management purposes  to differentiate between  SARS-CoV-2 and other Sarbecovirus currently known to infect humans.  If clinically indicated additional testing with an alternate test  methodology [phone]) is advised. The SARS-CoV-2 RNA is generally  detectable in upper and lower respiratory sp ecimens during the acute  phase of infection. The expected result is Negative. Fact Sheet for Patients:  StrictlyIdeas.no Fact Sheet for Healthcare  Providers: BankingDealers.co.za This test is not yet approved or cleared by the [place] FDA and has been authorized for detection and/or diagnosis of SARS-CoV-2 by FDA under an Emergency Use Authorization (EUA).  This EUA will remain in effect (meaning this test can be used) for the duration of the COVID-19 declaration under Section 564(b)(1) of the Act, 21 U.S.C. section 360bbb-3(b)(1), unless the authorization is terminated or revoked sooner. Performed at [facility], [address] [place], [place] [address]   Urine culture     Status: None   Collection Time: 01/02/19  8:52 PM   Specimen: Urine, Random  Result Value Ref Range Status   Specimen Description   Final  URINE, RANDOM Performed at [facility], [address] [place], [place] [address]    Special Requests   Final    NONE Performed at [facility], [address], [place], [place] [address]    Culture   Final    NO GROWTH Performed at [facility], [place] [address], [place], [place] [address]    Report Status 01/03/2019 FINAL  Final  MRSA PCR Screening     Status: None   Collection Time: 01/03/19  8:59 PM   Specimen: Nasal Mucosa; Nasopharyngeal  Result Value Ref Range Status   MRSA by PCR NEGATIVE NEGATIVE Final    Comment:        The GeneXpert MRSA Assay (FDA approved for NASAL specimens only), is one component of a comprehensive MRSA colonization surveillance program. It is not intended to diagnose MRSA infection nor to guide or monitor treatment for MRSA infections. Performed at [facility], [address] [place], [place] [address]         Code Status Orders  (From admission, onward)         Start     Ordered   01/03/19 0139  Full code  Continuous     01/03/19 0142        Code Status History    Date Active Date Inactive Code Status Order ID Comments User Context   12/25/2018 1233 01/02/2019 1950 Full Code  [id number]  Dustin Flock, MD Inpatient   12/25/2018 0645 12/25/2018 1233 Full Code [id number]  Paulette Blanch, MD ED   11/21/2018 2217 11/29/2018 2052 Full Code [id number]  Mayer Camel, NP ED   11/05/2018 1755 11/09/2018 1728 Full Code [id number]  Mayo, Pete Pelt, MD Inpatient   09/10/2017 1430 09/13/2017 2115 Full Code [id number]  Idelle Crouch, MD Inpatient   06/10/2017 2304 06/12/2017 1908 Full Code [id number]  Lance Coon, MD Inpatient   05/17/2017 0829 05/19/2017 1525 Full Code [id number]  Hillary Bow, MD ED   03/31/2017 1334 04/03/2017 1611 Full Code [id number]  Nicholes Mango, MD Inpatient   10/21/2016 0155 10/25/2016 2145 Full Code [id number]  Hugelmeyer, Dunnell, DO Inpatient   10/09/2016 2342 10/13/2016 2009 Full Code [id number]  Vaughan Basta, MD Inpatient   04/14/2015 1432 04/15/2015 1739 Full Code [id number]  Isaias Cowman, MD Inpatient   04/09/2015 0022 04/14/2015 1432 Full Code [id number]  Hower, Aaron Mose, MD ED   Advance Care Planning Activity    Advance Directive Documentation     Most Recent Value  Type of Advance Directive  Healthcare Power of Attorney  Pre-existing out of facility DNR order (yellow form or pink MOST form)  -  "MOST" Form in Place?  -          Follow-up Information    Adin Hector, MD. Go on 01/16/2019.   Specialty: Internal Medicine Why: @9 :30 AM  Contact information: [address] [place] [address] [phone]           Discharge Medications   Allergies as of 01/09/2019      Reactions   Cefepime    seizure   Diovan [valsartan] Cough   Gabapentin Other (See Comments)   Sedation at all doses   Hydralazine Itching   Lisinopril Cough   Pregabalin Other (See Comments)   Sedation at all doses   Levofloxacin Other (See Comments)   Too many side effects. Nausea, vomiting, upset stomach, increased confusion, etc Other reaction(s): Confusion Nausea, chills Nausea,  chills   Sulfamethoxazole-trimethoprim  Other (See Comments)   Too many side effects. Nausea, vomiting, upset stomach, increased confusion, etc      Medication List    TAKE these medications   amLODipine 2.5 MG tablet Commonly known as: NORVASC Take 1 tablet (2.5 mg total) by mouth daily.   feeding supplement (GLUCERNA SHAKE) Liqd Take 237 mLs by mouth 2 (two) times daily between meals.   finasteride 5 MG tablet Commonly known as: PROSCAR Take 5 mg by mouth daily.   meropenem  IVPB Commonly known as: MERREM Inject 500 mg into the vein every 12 (twelve) hours for 10 days. Indication:  Pseudomonas UTI and bacteremia Last Day of Therapy:  01/19/2019 Labs - Twice weekly:  CBC/D and BMP,   Metoprolol Tartrate 37.5 MG Tabs Take 37.5 mg by mouth 2 (two) times daily.   pantoprazole 40 MG tablet Commonly known as: PROTONIX Take 40 mg by mouth daily.   PRESERVISION/LUTEIN PO Take 1 capsule by mouth 2 (two) times daily.   multivitamin-iron-minerals-folic acid chewable tablet Chew 2 tablets by mouth daily.   rosuvastatin 5 MG tablet Commonly known as: CRESTOR Take 5 mg by mouth at bedtime.   sitaGLIPtin 50 MG tablet Commonly known as: JANUVIA Take 0.5 tablets (25 mg total) by mouth daily.   sodium bicarbonate 650 MG tablet Take 1 tablet (650 mg total) by mouth 2 (two) times daily.   tamsulosin 0.4 MG Caps capsule Commonly known as: FLOMAX Take 0.4 mg by mouth daily.   VITRON-C PO Take 1 tablet by mouth daily.            Home Infusion Instuctions  (From admission, onward)         Start     Ordered   01/09/19 0000  Home infusion instructions [facility] May follow [place] Dosing Protocol; May administer Cathflo as needed to maintain patency of vascular access device.; Flushing of vascular access device: per [facility] Protocol: 0.9% NaCl pre/post medica...    Question Answer Comment  Instructions May follow [facility] Dosing Protocol   Instructions May administer Cathflo as needed to maintain  patency of vascular access device.   Instructions Flushing of vascular access device: per [facility] Protocol: 0.9% NaCl pre/post medication administration and prn patency; Heparin 100 u/ml, 32ml for implanted ports and Heparin 10u/ml, 71ml for all other central venous catheters.   Instructions May follow [facility] Anaphylaxis Protocol for First Dose Administration in the home: 0.9% NaCl at 25-50 ml/hr to maintain IV access for protocol meds. Epinephrine 0.3 ml IV/IM PRN and Benadryl 25-50 IV/IM PRN s/s of anaphylaxis.   Instructions [facility] Infusion Coordinator (RN) to assist per patient IV care needs in the home PRN.      01/09/19 0908             Total Time in preparing paper work, data evaluation and todays exam - 66 minutes  Dustin Flock M.D on 01/09/2019 at 1:30 PM [place]  [phone]

## 2019-01-09 NOTE — TOC Progression Note (Signed)
Transition of Care [facility]) - Progression Note    Patient Details  Name: Blake [name] MRN: [id number] Date of Birth: 1923/04/30  Transition of Care [facility]) CM/SW Contact  Bond Grieshop, Lenice Llamas Phone Number: [phone]  01/09/2019, 8:19 AM  Clinical Narrative: Per Charlynne Cousins DME agency representative patient's family will have to purchase the bed wedges at [employer] retial store. The closest store is in [place]. Patient's daughter Brunetta Genera is aware of above.     Expected Discharge Plan: [place] Barriers to Discharge: Continued Medical Work up  Expected Discharge Plan and Services Expected Discharge Plan: [place] In-house Referral: Hospice / Palliative Care Discharge Planning Services: CM Consult Post Acute Care Choice: Resumption of Svcs/PTA Provider Living arrangements for the past 2 months: Single Family Home Expected Discharge Date: 01/06/19                         HH Arranged: RN, Nurse's Aide HH Agency: [facility] ([place]) Date HH Agency Contacted: 01/07/19 Time [place]: 0809 Representative spoke with at [place]: [place] ([place]) Interventions    Readmission Risk Interventions Readmission Risk Prevention Plan 12/30/2018 12/25/2018 11/24/2018  Transportation Screening Complete Complete Complete  PCP or Specialist Appt within 3-5 Days - - Complete  HRI or [place] - Complete Complete  Social Work Consult for [place] Planning/Counseling - Complete (No Data)  Palliative Care Screening - - Not Applicable  Medication Review Press photographer) Complete Complete Complete  HRI or Home Care Consult Complete - -  Some recent data might be hidden

## 2019-01-09 NOTE — Progress Notes (Signed)
Pt placed on stretcher by EMS and taken home via  EMS.  Dtr called and aware pt is leaving now for home. Dorna Bloom RN

## 2019-01-10 ENCOUNTER — Other Ambulatory Visit (INDEPENDENT_AMBULATORY_CARE_PROVIDER_SITE_OTHER): Payer: Medicare Other

## 2019-01-10 ENCOUNTER — Ambulatory Visit (INDEPENDENT_AMBULATORY_CARE_PROVIDER_SITE_OTHER): Payer: Medicare Other | Admitting: Nurse Practitioner

## 2019-01-15 ENCOUNTER — Telehealth: Payer: Self-pay | Admitting: Student

## 2019-01-15 NOTE — Telephone Encounter (Signed)
239pm: Palliative NP spoke with daughter Blake [name] regarding palliative referral. She states her father is doing well. She states she was unsure why referral was made. NP reviewed reasons Palliative Medicine is consulted such as for goals of care, symptom management, advanced care planning. She states home health nurse visited today and is reaching out to nephrology regarding orders/instructions for recently placed bilateral nephrostomy tubes. She states she will check her schedule and notify NP of when visit can be scheduled.

## 2019-01-16 ENCOUNTER — Telehealth: Payer: Self-pay | Admitting: Urology

## 2019-01-16 ENCOUNTER — Other Ambulatory Visit: Payer: Self-pay

## 2019-01-16 ENCOUNTER — Ambulatory Visit: Admission: RE | Admit: 2019-01-16 | Payer: Medicare Other | Source: Ambulatory Visit

## 2019-01-16 ENCOUNTER — Other Ambulatory Visit: Payer: Self-pay | Admitting: Diagnostic Radiology

## 2019-01-16 ENCOUNTER — Ambulatory Visit
Admission: RE | Admit: 2019-01-16 | Discharge: 2019-01-16 | Disposition: A | Discharge status: Home / Self Care | Payer: Medicare Other | Source: Ambulatory Visit | Attending: Diagnostic Radiology | Admitting: Diagnostic Radiology

## 2019-01-16 DIAGNOSIS — N1330 Unspecified hydronephrosis: Secondary | ICD-10-CM

## 2019-01-16 DIAGNOSIS — Z436 Encounter for attention to other artificial openings of urinary tract: Secondary | ICD-10-CM | POA: Diagnosis present

## 2019-01-16 HISTORY — PX: IR NEPHROSTOGRAM LEFT THRU EXISTING ACCESS: IMG6061

## 2019-01-16 LAB — GLUCOSE, CAPILLARY: Glucose-Capillary: 102 mg/dL — ABNORMAL HIGH (ref 70–99)

## 2019-01-16 MED ORDER — IODIXANOL 320 MG/ML IV SOLN
50.0000 mL | Freq: Once | INTRAVENOUS | Status: AC | PRN
  Administered 2019-01-16: 10 mL

## 2019-01-16 NOTE — Telephone Encounter (Signed)
Left a message with specials to see if patient can be seen today, waiting on call back

## 2019-01-16 NOTE — Telephone Encounter (Signed)
Spoke with Jocelyn Lamer at [facility] they can see patient today to check tube placement at 2pm. Contacted patient's daughter and notified her she states her brother will bring patient

## 2019-01-16 NOTE — Telephone Encounter (Signed)
Patient's daughter, Lazarus Salines, called back to the office.  Patient's nephrostomy tubes are leaking at the insertion site.  His back is wet when he get up.  He is taking in fluids, but his output has only been 100cc.  Please call and give her advice on how to proceed.

## 2019-01-16 NOTE — Telephone Encounter (Signed)
Pt's daughter called and states that his cath his leaking. Please advise.

## 2019-01-18 ENCOUNTER — Telehealth: Payer: Self-pay | Admitting: Urology

## 2019-01-18 NOTE — Telephone Encounter (Signed)
Pt had nephrostomy tubes placed in the hospital and the nurse states that the family has no instructions on how to take care of them. The nurse states the pt is really weak right now also.Please advise.

## 2019-01-18 NOTE — Telephone Encounter (Signed)
Left mess to call 

## 2019-01-22 ENCOUNTER — Telehealth: Payer: Self-pay | Admitting: Urology

## 2019-01-22 NOTE — Telephone Encounter (Signed)
Called no answer, left vmail to call back

## 2019-01-22 NOTE — Telephone Encounter (Signed)
Spoke with patient's daughter and notified her this can happen with a nephrostomy tube and as long as it is draining it is fine

## 2019-01-22 NOTE — Telephone Encounter (Signed)
Home Health Nurse would like for Clinical to call her to discuss this patients Nephrostomy Tube. There appears to be blood in the tube. [phone]

## 2019-01-24 NOTE — Progress Notes (Deleted)
   Patient ID: Blake [name], male    DOB: April 24, 1923, 85 y.o.   MRN: [id number]  HPI  Blake [name] is a 85 y/o male with a history of  Echo report from 11/22/2018 reviewed and showed an EF of 55-60%  Admitted 01/02/2019 due to seizures. Neurology consults obtained. Had worsening renal functions and IV fluids were given. Seizure thought to be due to antibiotic so it was switched to a different one. Discharged after 7 days. Admitted 12/25/2018 due to bacteremia due to UTI. Given IV antibiotics. ID, urology and palliative care consults obtained. Had acute on Chronic renal failure secondary to obstruction from bilateral hydroureteronephrosis with h/o Bladder cancer. Patient is status post percutaneous bilateral nephrostomy tube and foley. Given IV lasix with ~ net loss of 10L. Discharged after 8 days.   Patient presents for an initial visit with a chief complaint of  Review of Systems    Physical Exam    Assessment & Plan:  1: Chronic heart failure with preserved ejection fraction- - NYHA class - saw cardiology Blake [name]) 12/08/17 - BNP 12/25/2018 was 1226.0  2: HTN- - BP - saw PCP Blake [name]) 12/05/2018 - BMP 01/09/2019 reviewed and showed sodium 142, potassium 4.0, creatinine 3.83 and GFR 15  3: DM- - A1c 01/03/2019 was 6.9%

## 2019-01-25 ENCOUNTER — Telehealth: Payer: Self-pay | Admitting: Family

## 2019-01-25 ENCOUNTER — Ambulatory Visit: Payer: Medicare Other | Admitting: Family

## 2019-01-25 ENCOUNTER — Other Ambulatory Visit: Payer: Self-pay

## 2019-01-25 ENCOUNTER — Ambulatory Visit (INDEPENDENT_AMBULATORY_CARE_PROVIDER_SITE_OTHER): Payer: Medicare Other | Admitting: Urology

## 2019-01-25 ENCOUNTER — Other Ambulatory Visit: Payer: Self-pay | Admitting: Radiology

## 2019-01-25 VITALS — BP 114/66 | HR 75 | Ht 74.0 in

## 2019-01-25 DIAGNOSIS — N1330 Unspecified hydronephrosis: Secondary | ICD-10-CM | POA: Diagnosis not present

## 2019-01-25 DIAGNOSIS — C679 Malignant neoplasm of bladder, unspecified: Secondary | ICD-10-CM | POA: Diagnosis not present

## 2019-01-25 DIAGNOSIS — N179 Acute kidney failure, unspecified: Secondary | ICD-10-CM

## 2019-01-25 NOTE — Progress Notes (Signed)
01/25/2019 11:26 AM   Blake [name] 19-Apr-1923 [id number]  Referring provider: Adin Hector, MD [facility] [place],  [place] [address]  Chief Complaint  Patient presents with  . Follow-up    HPI: 85 year old male presents for hospital follow-up.  He has a history of prostate cancer status post radiation and longstanding bilateral hydronephrosis with recent creatinine rise from baseline of 2.2-6.2.  Family initially declined nephrostomy tube drainage though he was hospitalized with pneumonia and UTI in late June.  He had nephrostomy tubes placed during that hospitalization and at the time of discharge his creatinine was 3.8.  His Foley catheter was removed.  The vast majority of his urine is through the nephrostomy tubes.  He was having some leakage around the tubes and nephrostogram performed last week showed the tubes in good position.  He saw Dr. Caryl Comes yesterday and creatinine was 2.8.  He complains of some discomfort at the tube insertion site.  He has a history of incompletely resected bladder cancer and family has declined cystoscopy/TURBT due to concerns of anesthesia.  He denies hematuria.   PMH: Past Medical History:  Diagnosis Date  . AAA (abdominal aortic aneurysm) ([place])   . Arrhythmia   . CHF (congestive heart failure) ([place])   . Diabetes mellitus without complication ([place])   . GI bleed   . Heart murmur   . Hyperlipidemia   . Hypertension   . Pacemaker   . Prostate cancer [facility])     Surgical History: Past Surgical History:  Procedure Laterality Date  . FLEXIBLE SIGMOIDOSCOPY N/A 10/21/2016   Procedure: FLEXIBLE SIGMOIDOSCOPY;  Surgeon: [name], MD;  Location: [facility] ENDOSCOPY;  Service: Endoscopy;  Laterality: N/A;  . IR NEPHROSTOGRAM LEFT THRU EXISTING ACCESS  01/16/2019  . IR NEPHROSTOMY PLACEMENT RIGHT  01/01/2019  . JOINT REPLACEMENT    . NEPHROSTOMY Bilateral   . PACEMAKER INSERTION Left 04/14/2015   Procedure: INSERTION  PACEMAKER;  Surgeon: Isaias Cowman, MD;  Location: [facility] ORS;  Service: Cardiovascular;  Laterality: Left;  . PROSTATE SURGERY    . VISCERAL ARTERY INTERVENTION N/A 10/22/2016   Procedure: Visceral Artery Intervention;  Surgeon: Algernon Huxley, MD;  Location: [place] CV LAB;  Service: Cardiovascular;  Laterality: N/A;    Home Medications:  Allergies as of 01/25/2019      Reactions   Cefepime    seizure   Diovan [valsartan] Cough   Gabapentin Other (See Comments)   Sedation at all doses   Hydralazine Itching   Lisinopril Cough   Pregabalin Other (See Comments)   Sedation at all doses   Levofloxacin Other (See Comments)   Too many side effects. Nausea, vomiting, upset stomach, increased confusion, etc Other reaction(s): Confusion Nausea, chills Nausea, chills   Sulfamethoxazole-trimethoprim Other (See Comments)   Too many side effects. Nausea, vomiting, upset stomach, increased confusion, etc      Medication List       Accurate as of January 25, 2019 11:26 AM. If you have any questions, ask your nurse or doctor.        amLODipine 2.5 MG tablet Commonly known as: NORVASC Take 1 tablet (2.5 mg total) by mouth daily.   feeding supplement (GLUCERNA SHAKE) Liqd Take 237 mLs by mouth 2 (two) times daily between meals.   finasteride 5 MG tablet Commonly known as: PROSCAR Take 5 mg by mouth daily.   Metoprolol Tartrate 37.5 MG Tabs Take 37.5 mg by mouth 2 (two) times daily.  pantoprazole 40 MG tablet Commonly known as: PROTONIX Take 40 mg by mouth daily.   PRESERVISION/LUTEIN PO Take 1 capsule by mouth 2 (two) times daily.   multivitamin-iron-minerals-folic acid chewable tablet Chew 2 tablets by mouth daily.   rosuvastatin 5 MG tablet Commonly known as: CRESTOR Take 5 mg by mouth at bedtime.   sitaGLIPtin 50 MG tablet Commonly known as: JANUVIA Take 0.5 tablets (25 mg total) by mouth daily.   sodium bicarbonate 650 MG tablet Take 1 tablet (650 mg total) by  mouth 2 (two) times daily.   tamsulosin 0.4 MG Caps capsule Commonly known as: FLOMAX Take 0.4 mg by mouth daily.   VITRON-C PO Take 1 tablet by mouth daily.       Allergies:  Allergies  Allergen Reactions  . Cefepime     seizure  . Diovan [Valsartan] Cough  . Gabapentin Other (See Comments)    Sedation at all doses  . Hydralazine Itching  . Lisinopril Cough  . Pregabalin Other (See Comments)    Sedation at all doses  . Levofloxacin Other (See Comments)    Too many side effects. Nausea, vomiting, upset stomach, increased confusion, etc Other reaction(s): Confusion Nausea, chills Nausea, chills   . Sulfamethoxazole-Trimethoprim Other (See Comments)    Too many side effects. Nausea, vomiting, upset stomach, increased confusion, etc    Family History: Family History  Problem Relation Age of Onset  . Hypertension Other   . Stroke Other   . Heart attack Other   . Stroke Sister   . Heart attack Brother     Social History:  reports that he has quit smoking. He has never used smokeless tobacco. He reports that he does not drink alcohol or use drugs.  ROS: UROLOGY Frequent Urination?: No Hard to postpone urination?: No Burning/pain with urination?: No Get up at night to urinate?: No Leakage of urine?: No Urine stream starts and stops?: No Trouble starting stream?: No Do you have to strain to urinate?: No Blood in urine?: No Urinary tract infection?: No Sexually transmitted disease?: No Injury to kidneys or bladder?: No Painful intercourse?: No Weak stream?: No Erection problems?: No Penile pain?: No  Gastrointestinal Nausea?: No Vomiting?: No Indigestion/heartburn?: No Diarrhea?: No Constipation?: No  Constitutional Fever: No Night sweats?: No Weight loss?: No Fatigue?: No  Skin Skin rash/lesions?: No Itching?: No  Eyes Blurred vision?: No Double vision?: No  Ears/Nose/Throat Sore throat?: No Sinus problems?: No  Hematologic/Lymphatic  Swollen glands?: No Easy bruising?: No  Cardiovascular Leg swelling?: No Chest pain?: No  Respiratory Cough?: No Shortness of breath?: No  Endocrine Excessive thirst?: No  Musculoskeletal Back pain?: No Joint pain?: No  Neurological Headaches?: No Dizziness?: No  Psychologic Depression?: No Anxiety?: No  Physical Exam: BP 114/66 (BP Location: Left Arm, Patient Position: Sitting, Cuff Size: Normal)   Pulse 75   Ht 6\' 2"  (1.88 m)   BMI 27.60 kg/m   Constitutional:  Alert and oriented, No acute distress. HEENT: [place] AT, moist mucus membranes.  Trachea midline, no masses. Cardiovascular: No clubbing, cyanosis, or edema. Respiratory: Normal respiratory effort, no increased work of breathing. GU: Nephrostomy tubes draining clear urine.   Assessment & Plan:   Bilateral hydronephrosis with acute on chronic renal failure with significant improvement status post bilateral nephrostomy tube placement.  Will schedule nephrostomy tube exchange in approximately 2 weeks.  Return in about 3 months (around 04/27/2019) for Recheck.   Abbie Sons, MD  [facility] [address],  [place], [place] [address] [phone]

## 2019-01-25 NOTE — Telephone Encounter (Signed)
Patient did not show for his [facility] appointment on 01/25/2019. Will attempt to reschedule.

## 2019-01-30 ENCOUNTER — Encounter: Payer: Self-pay | Admitting: Urology

## 2019-01-31 ENCOUNTER — Other Ambulatory Visit: Payer: Self-pay | Admitting: Radiology

## 2019-02-06 ENCOUNTER — Other Ambulatory Visit: Payer: Self-pay

## 2019-02-06 ENCOUNTER — Ambulatory Visit
Admission: RE | Admit: 2019-02-06 | Discharge: 2019-02-06 | Disposition: A | Discharge status: Home / Self Care | Payer: Medicare Other | Source: Ambulatory Visit | Attending: Urology | Admitting: Urology

## 2019-02-06 ENCOUNTER — Encounter: Payer: Self-pay | Admitting: Interventional Radiology

## 2019-02-06 DIAGNOSIS — Z436 Encounter for attention to other artificial openings of urinary tract: Secondary | ICD-10-CM | POA: Diagnosis present

## 2019-02-06 DIAGNOSIS — N1330 Unspecified hydronephrosis: Secondary | ICD-10-CM | POA: Insufficient documentation

## 2019-02-06 HISTORY — PX: IR NEPHROSTOMY EXCHANGE LEFT: IMG6069

## 2019-02-06 HISTORY — PX: IR NEPHROSTOMY EXCHANGE RIGHT: IMG6070

## 2019-02-06 MED ORDER — IODIXANOL 320 MG/ML IV SOLN
50.0000 mL | Freq: Once | INTRAVENOUS | Status: AC | PRN
  Administered 2019-02-06: 09:00:00 10 mL

## 2019-02-06 NOTE — Procedures (Signed)
Pre Procedure Dx: Hydronephrosis Post Procedure Dx: Same  Successful bilateral PCN exchange.    EBL: None   No immediate complications.   Jay Lakeria Starkman, MD Pager #: [phone]  

## 2019-02-06 NOTE — Progress Notes (Signed)
Dr. Pascal Lux spoke with pt. Daughter : Lazarus Salines (POA)-consent obtained by MD now post live conversation with daughter. Pt. HX: confusion. Pt. Oriented to self and place only at present; not time. States "it's 1962."

## 2019-02-09 ENCOUNTER — Telehealth: Payer: Self-pay | Admitting: Urology

## 2019-02-09 ENCOUNTER — Ambulatory Visit (INDEPENDENT_AMBULATORY_CARE_PROVIDER_SITE_OTHER): Payer: Medicare Other | Admitting: Physician Assistant

## 2019-02-09 ENCOUNTER — Encounter: Payer: Self-pay | Admitting: Physician Assistant

## 2019-02-09 ENCOUNTER — Other Ambulatory Visit: Payer: Self-pay

## 2019-02-09 VITALS — BP 133/72 | HR 72 | Ht 74.0 in | Wt 220.0 lb

## 2019-02-09 DIAGNOSIS — N390 Urinary tract infection, site not specified: Secondary | ICD-10-CM | POA: Diagnosis not present

## 2019-02-09 LAB — MICROSCOPIC EXAMINATION
Bacteria, UA: NONE SEEN
RBC, Urine: >30 /hpf — AB (ref 0–2)
WBC, UA: >30 /hpf — AB (ref 0–5)

## 2019-02-09 LAB — URINALYSIS, COMPLETE
Bilirubin, UA: NEGATIVE
Glucose, UA: NEGATIVE
Ketones, UA: NEGATIVE
Nitrite, UA: NEGATIVE
Specific Gravity, UA: 1.02 (ref 1.005–1.030)
Urobilinogen, Ur: 1 mg/dL (ref 0.2–1.0)
pH, UA: 5.5 (ref 5.0–7.5)

## 2019-02-09 MED ORDER — CEFDINIR 300 MG PO CAPS: 300.0000 mg | ORAL_CAPSULE | Freq: Every day | ORAL | 0 refills | Status: AC

## 2019-02-09 NOTE — Progress Notes (Signed)
02/09/2019 4:03 PM   [name] 05-13-1923 [id number]  CC: Fatigue, anorexia  HPI: Blake [name] is a 85 y.o. male who presents today for evaluation of possible UTI. He is an established [facility] patient who last saw Dr. Bernardo Heater on 01/25/2019 for hospital follow-up s/p bilateral PCN placement approximately one month prior. Patient has CKD; most recent creatinine 2.81 on 01/15/2019. He also has a known urothelial carcinoma of the bladder; family has deferred treatment due to his medical comorbidities. He is accompanied today by his daughter and son-in-law, who contributed to HPI.  Patient had his nephrostomy tubes changed 3 days ago. He reports that he has felt fatigued and "not quite right" since. Family reports that he is increasingly somnolent, disoriented, and anorexic and are concerned about gross hematuria in his right PCN.  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 Urine cultures on 11/21/2018 and 12/25/2018 both with Pseudomonas with intermediate susceptibility to ciprofloxacin. Patient has previously been of cephalexin daily prophylaxis, however this was stopped. Additionally, he reports allergies to Bactrim, levofloxacin, and cefepime.   In-office UA today positive for 3+ blood, 3+ protein, and 1+ leukocyte esterase; urine microscopy with >30 WBCs/HPF and >30 RBCs/HPF. Urine culture pending.  PMH: Past Medical History:  Diagnosis Date  . AAA (abdominal aortic aneurysm) ([place])   . Arrhythmia   . CHF (congestive heart failure) ([place])   . Diabetes mellitus without complication ([place])   . GI bleed   . Heart murmur   . Hyperlipidemia   . Hypertension   . Pacemaker   . Prostate cancer [facility])     Surgical History: Past Surgical History:  Procedure Laterality Date  . FLEXIBLE SIGMOIDOSCOPY N/A 10/21/2016   Procedure: FLEXIBLE SIGMOIDOSCOPY;  Surgeon: [name], MD;  Location: [facility] ENDOSCOPY;  Service: Endoscopy;  Laterality: N/A;  . IR NEPHROSTOGRAM LEFT THRU  EXISTING ACCESS  01/16/2019  . IR NEPHROSTOMY EXCHANGE LEFT  02/06/2019  . IR NEPHROSTOMY EXCHANGE RIGHT  02/06/2019  . IR NEPHROSTOMY PLACEMENT RIGHT  01/01/2019  . JOINT REPLACEMENT    . NEPHROSTOMY Bilateral   . PACEMAKER INSERTION Left 04/14/2015   Procedure: INSERTION PACEMAKER;  Surgeon: Isaias Cowman, MD;  Location: [facility] ORS;  Service: Cardiovascular;  Laterality: Left;  . PROSTATE SURGERY    . VISCERAL ARTERY INTERVENTION N/A 10/22/2016   Procedure: Visceral Artery Intervention;  Surgeon: Algernon Huxley, MD;  Location: [place] CV LAB;  Service: Cardiovascular;  Laterality: N/A;    Home Medications:  Allergies as of 02/09/2019      Reactions   Cefepime    seizure   Diovan [valsartan] Cough   Gabapentin Other (See Comments)   Sedation at all doses   Hydralazine Itching   Lisinopril Cough   Pregabalin Other (See Comments)   Sedation at all doses   Levofloxacin Other (See Comments)   Too many side effects. Nausea, vomiting, upset stomach, increased confusion, etc Other reaction(s): Confusion Nausea, chills Nausea, chills   Sulfamethoxazole-trimethoprim Other (See Comments)   Too many side effects. Nausea, vomiting, upset stomach, increased confusion, etc      Medication List       Accurate as of February 09, 2019  4:03 PM. If you have any questions, ask your nurse or doctor.        amLODipine 2.5 MG tablet Commonly known as: NORVASC Take 1 tablet (2.5 mg total) by mouth daily.   cefdinir 300 MG capsule Commonly known as: OMNICEF Take 1 capsule (300 mg total)  by mouth daily for 10 days. Started by: Debroah Loop, PA-C   feeding supplement (GLUCERNA SHAKE) Liqd Take 237 mLs by mouth 2 (two) times daily between meals.   finasteride 5 MG tablet Commonly known as: PROSCAR Take 5 mg by mouth daily.   glimepiride 4 MG tablet Commonly known as: AMARYL   Metoprolol Tartrate 37.5 MG Tabs Take 37.5 mg by mouth 2 (two) times daily.   pantoprazole 40 MG  tablet Commonly known as: PROTONIX Take 40 mg by mouth daily.   PRESERVISION/LUTEIN PO Take 1 capsule by mouth 2 (two) times daily.   multivitamin-iron-minerals-folic acid chewable tablet Chew 2 tablets by mouth daily.   rosuvastatin 5 MG tablet Commonly known as: CRESTOR Take 5 mg by mouth at bedtime.   sitaGLIPtin 50 MG tablet Commonly known as: JANUVIA Take 0.5 tablets (25 mg total) by mouth daily.   sodium bicarbonate 650 MG tablet Take 1 tablet (650 mg total) by mouth 2 (two) times daily.   tamsulosin 0.4 MG Caps capsule Commonly known as: FLOMAX Take 0.4 mg by mouth daily.   VITRON-C PO Take 1 tablet by mouth daily.       Allergies:  Allergies  Allergen Reactions  . Cefepime     seizure  . Diovan [Valsartan] Cough  . Gabapentin Other (See Comments)    Sedation at all doses  . Hydralazine Itching  . Lisinopril Cough  . Pregabalin Other (See Comments)    Sedation at all doses  . Levofloxacin Other (See Comments)    Too many side effects. Nausea, vomiting, upset stomach, increased confusion, etc Other reaction(s): Confusion Nausea, chills Nausea, chills   . Sulfamethoxazole-Trimethoprim Other (See Comments)    Too many side effects. Nausea, vomiting, upset stomach, increased confusion, etc    Family History: Family History  Problem Relation Age of Onset  . Hypertension Other   . Stroke Other   . Heart attack Other   . Stroke Sister   . Heart attack Brother     Social History:   reports that he has quit smoking. He has never used smokeless tobacco. He reports that he does not drink alcohol or use drugs.  ROS: UROLOGY Frequent Urination?: No Hard to postpone urination?: No Burning/pain with urination?: No Get up at night to urinate?: No Leakage of urine?: No Urine stream starts and stops?: No Trouble starting stream?: No Do you have to strain to urinate?: No Blood in urine?: Yes Urinary tract infection?: No Sexually transmitted disease?:  No Injury to kidneys or bladder?: No Painful intercourse?: No Weak stream?: No Erection problems?: No Penile pain?: No  Gastrointestinal Nausea?: No Vomiting?: No Indigestion/heartburn?: No Diarrhea?: No Constipation?: No  Constitutional Fever: No Night sweats?: No Weight loss?: No  Skin Skin rash/lesions?: No Itching?: No  Eyes Blurred vision?: No Double vision?: No  Ears/Nose/Throat Sore throat?: No Sinus problems?: No  Hematologic/Lymphatic Swollen glands?: No Easy bruising?: No  Cardiovascular Leg swelling?: No Chest pain?: No  Respiratory Cough?: No Shortness of breath?: No  Endocrine Excessive thirst?: No  Musculoskeletal Back pain?: Yes Joint pain?: No  Neurological Headaches?: No Dizziness?: No  Psychologic Depression?: No Anxiety?: No  Physical Exam: BP 133/72 (BP Location: Left Arm, Patient Position: Sitting)   Pulse 72   Ht 6\' 2"  (1.88 m)   Wt 220 lb (99.8 kg)   BMI 28.25 kg/m   Constitutional:  Awake and responsive, appears drowsy and fell asleep twice during interview. HEENT: Normocephalic; bilateral hearing loss L>R. Cardiovascular: No clubbing,  cyanosis, or edema. Respiratory: Normal respiratory effort, no increased work of breathing. GU: No CVA tenderness. Bilateral nephrostomy tubes in place without exudate or localized erythema, edema, or warmth. Skin: No rashes, bruises or suspicious lesions. Neurologic: Grossly intact, moving all 4 extremities. Psychiatric: Normal mood and affect.  Laboratory Data: Urinalysis    Component Value Date/Time   COLORURINE YELLOW (A) 01/02/2019 2052   APPEARANCEUR Cloudy (A) 02/09/2019 1536   LABSPEC 1.014 01/02/2019 2052   LABSPEC 1.029 12/29/2012 1554   PHURINE 5.0 01/02/2019 2052   GLUCOSEU Negative 02/09/2019 1536   GLUCOSEU see comment 12/29/2012 1554   HGBUR LARGE (A) 01/02/2019 2052   BILIRUBINUR Negative 02/09/2019 1536   BILIRUBINUR see comment 12/29/2012 [place] 01/02/2019 2052   PROTEINUR 3+ (A) 02/09/2019 1536   PROTEINUR 100 (A) 01/02/2019 2052   NITRITE Negative 02/09/2019 1536   NITRITE NEGATIVE 01/02/2019 2052   LEUKOCYTESUR 1+ (A) 02/09/2019 1536   LEUKOCYTESUR MODERATE (A) 01/02/2019 2052   LEUKOCYTESUR see comment 12/29/2012 1554    Results for orders placed or performed in visit on 02/09/19  CULTURE, URINE COMPREHENSIVE     Status: None   Collection Time: 02/09/19  3:36 PM   Specimen: Urine   CU  Result Value Ref Range Status   Urine Culture, Comprehensive Final report  Final   Organism ID, Bacteria Comment  Final    Comment: No growth in 36 - 48 hours.  Microscopic Examination     Status: Abnormal   Collection Time: 02/09/19  3:36 PM   URINE  Result Value Ref Range Status   WBC, UA >30 (A) 0 - 5 /hpf Final   RBC >30 (A) 0 - 2 /hpf Final   Epithelial Cells (non renal) 0-10 0 - 10 /hpf Final   Renal Epithel, UA 0-10 (A) None seen /hpf Final   Mucus, UA Present Not Estab. Final   Bacteria, UA None seen None seen/Few Final   Assessment & Plan:   1. Fatigue, possible AMS I spent a significant amount of time in shared decision making with the patient and his family in determining the best course of action given patient's current presentation. Patient is lethargic but nontoxic appearing; I cannot rule in or out urinary or other infection at this time. However, patient's family reports increased confusion and AMS and I did struggle to receive consistent responses from him during our interview. I am unable to determine if this is typical for him at baseline, so I cannot say if this represents true AMS in my best clinical judgment.  I counseled the family that gross hematuria in the right PCN, particularly given his tube exchange 3 days ago, is not an unusual or concerning finding.  Patient's last two urine cultures grew Cipro-resistant Pseudomonas and he has documented allergies to Bactrim, levofloxacin, and cefepime.  Additionally, he has known renal insufficiency. Assuming a recurrent infection with the same organism, I explained to the family that our therapeutic options with oral antibiotics are extremely limited at this time and that introducing antibiotics today in the absence of clinical data supporting infection may contribute to more antibacterial resistance in the future. They are adamant that his current mental status has deviated from baseline and are concerned that withholding antibiotics pending urine culture results may result in rapid decline that may necessitate hospitalization.  I do not believe the patient to be a good candidate for IV antibiotics at this time, as hospitalization at his age and with his  medical history should be avoided if possible. Of note, patient previously had a PICC line in place for at-home antibiotic therapy, however this was discontinued recently.  In shared decision making with the family, we decided to proceed with PO antibiotic therapy today. Cefdinir 300mg  daily (renal dosing) x10 days prescribed. I counseled the family that he may experience some cross-sensitivity given his known cefepime allergy. Patient's daughter insists that he has taken cefdinir previously and tolerated it well. Additionally, patient has tolerated cephalexin well in the past, which suggests limited cross-reactivity. I will update them with his urine culture results when I receive them. I counseled them that these results may result in [place] changing or stopping his antibiotic therapy. -Urinalysis, Complete -Urine culture -Cefdinir 300mg  daily x10 days  Debroah Loop, [facility]  [place] [address] [place], [place] [address] [phone]

## 2019-02-09 NOTE — Telephone Encounter (Signed)
Pt's daughter called and would like a call back she states that she feels like he is getting an infection.She states that she spoke to Dr Bernardo Heater concerning him a couple of days ago and would like to know if he can see him today. Please advise.

## 2019-02-11 LAB — CULTURE, URINE COMPREHENSIVE

## 2019-02-12 NOTE — Telephone Encounter (Signed)
I just spoke with Mr. Warman daughter Lazarus Salines, to inform her of her father's urine culture results. I informed her that his culture results are negative, meaning that he does not have a urinary tract infection at this time. I stated that this means there is no indication for him to be on antibiotic therapy, particularly given his history of antibiotic resistance and medication allergies. I counseled her to stop Mr. Lindh's antibiotics at this time. She expressed an understanding of this plan.  Dr. Primus Bravo stated that Mr. Wentworth is doing better today. She is curious why her father has improved on antibiotics if there was no infection. I explained that I suspect his drowsiness was a result of the PCN tube changes and that he would have improved regardless of antibiotic therapy. She expressed skepticism with this assessment and stated that she is concerned about how her father will tolerate future PCN changes. I stated that I understood her concern and reiterated that Mr. Chery should not continue his prescribed antibiotics.  Debroah Loop, PA-C 02/12/19 8:23 AM

## 2019-03-05 ENCOUNTER — Other Ambulatory Visit: Payer: Self-pay

## 2019-03-05 ENCOUNTER — Other Ambulatory Visit: Payer: Medicare Other

## 2019-03-05 ENCOUNTER — Telehealth: Payer: Self-pay | Admitting: Urology

## 2019-03-05 DIAGNOSIS — N390 Urinary tract infection, site not specified: Secondary | ICD-10-CM

## 2019-03-05 NOTE — Telephone Encounter (Signed)
Per Thomas Hoff, PA, added patient to the lab schedule to check his urine for infection.  Per patient's daughter, they have sterile urine cups and will bring a urine sample to the office today for UA & culture.

## 2019-03-05 NOTE — Telephone Encounter (Signed)
Per Sam, patient does not have signs of infection in the UA today.  We will send the specimen out for culture and call when results are received.  Patient's daughter, Lazarus Salines, was notified.  She states that the patient is confused and disoriented.  Sam recommended that she contact his PCP, but to inform them that we did a UA in the office today with no signs of infection.    We will call when urine culture results are received.

## 2019-03-05 NOTE — Telephone Encounter (Signed)
Pt's daughter [employer] and states that she feels like he has an infection. She would like a call back to discuss. Please advise.

## 2019-03-06 LAB — URINALYSIS, COMPLETE
Bilirubin, UA: NEGATIVE
Glucose, UA: NEGATIVE
Ketones, UA: NEGATIVE
Nitrite, UA: NEGATIVE
RBC, UA: NEGATIVE
Specific Gravity, UA: 1.02 (ref 1.005–1.030)
Urobilinogen, Ur: 0.2 mg/dL (ref 0.2–1.0)
pH, UA: 5.5 (ref 5.0–7.5)

## 2019-03-06 LAB — MICROSCOPIC EXAMINATION: RBC, Urine: NONE SEEN /hpf (ref 0–2)

## 2019-03-07 ENCOUNTER — Telehealth: Payer: Self-pay | Admitting: Physician Assistant

## 2019-03-07 LAB — CULTURE, URINE COMPREHENSIVE

## 2019-03-08 ENCOUNTER — Other Ambulatory Visit: Payer: Self-pay | Admitting: Radiology

## 2019-03-08 DIAGNOSIS — N1330 Unspecified hydronephrosis: Secondary | ICD-10-CM

## 2019-03-08 NOTE — Telephone Encounter (Signed)
Left message for Mrs. Primus Bravo to return call concerning Mr. Balistreri.

## 2019-03-08 NOTE — Telephone Encounter (Signed)
Please contact Blake [name] daughter and inform her that the results of his urine culture are negative.  He does not have a urinary tract infection.  There is no indication for antibiotics at this time for treatment of a urinary infection.  If she remains concerned for his mental status, she should follow-up with his primary care provider for evaluation of other possible non-urological causes of this.

## 2019-03-08 NOTE — Telephone Encounter (Signed)
Patient's daughter notified and voiced understanding. 

## 2019-03-23 ENCOUNTER — Ambulatory Visit: Payer: Medicare Other

## 2019-03-23 ENCOUNTER — Ambulatory Visit (HOSPITAL_COMMUNITY): Payer: Medicare Other

## 2019-03-23 ENCOUNTER — Ambulatory Visit: Admission: RE | Admit: 2019-03-23 | Payer: Medicare Other | Source: Ambulatory Visit

## 2019-03-27 ENCOUNTER — Other Ambulatory Visit: Payer: Self-pay | Admitting: Internal Medicine

## 2019-03-27 DIAGNOSIS — R4182 Altered mental status, unspecified: Secondary | ICD-10-CM

## 2019-04-09 ENCOUNTER — Other Ambulatory Visit: Admission: RE | Admit: 2019-04-09 | Payer: Medicare Other | Source: Ambulatory Visit

## 2019-04-10 ENCOUNTER — Other Ambulatory Visit: Admission: RE | Admit: 2019-04-10 | Payer: Medicare Other | Source: Ambulatory Visit

## 2019-04-12 ENCOUNTER — Other Ambulatory Visit: Payer: Self-pay

## 2019-04-12 ENCOUNTER — Ambulatory Visit
Admission: RE | Admit: 2019-04-12 | Discharge: 2019-04-12 | Disposition: A | Discharge status: Home / Self Care | Payer: Medicare Other | Source: Ambulatory Visit | Attending: Urology | Admitting: Urology

## 2019-04-12 DIAGNOSIS — Z436 Encounter for attention to other artificial openings of urinary tract: Secondary | ICD-10-CM | POA: Diagnosis not present

## 2019-04-12 DIAGNOSIS — N1330 Unspecified hydronephrosis: Secondary | ICD-10-CM | POA: Insufficient documentation

## 2019-04-12 HISTORY — PX: IR NEPHROSTOMY EXCHANGE RIGHT: IMG6070

## 2019-04-12 HISTORY — PX: IR NEPHROSTOMY EXCHANGE LEFT: IMG6069

## 2019-04-12 LAB — GLUCOSE, CAPILLARY: Glucose-Capillary: 143 mg/dL — ABNORMAL HIGH (ref 70–99)

## 2019-04-12 MED ORDER — IODIXANOL 320 MG/ML IV SOLN
50.0000 mL | Freq: Once | INTRAVENOUS | Status: AC | PRN
  Administered 2019-04-12: 20 mL

## 2019-04-12 NOTE — Discharge Instructions (Signed)
Percutaneous Nephrostomy  Percutaneous nephrostomy is a procedure to insert a flexible tube into your kidney so that urine can leave your body. This procedure may be done if a medical condition prevents urine from leaving your kidney in the usual way. Urine is normally carried from the kidneys to the bladder through narrow tubes called ureters. A ureter can become blocked because of conditions such as kidney stones, tumors, infection, or blood clots. The nephrostomy tube will be inserted through your back. After the procedure, the tube will remain in place, and urine will drain from the kidney into a drainage bag outside your body. Draining the urine will relieve pressure and help prevent infection that could damage the kidney. Often, this procedure allows your health care provider to identify the cause of the blockage and plan appropriate treatment. Tell a health care provider about:  Any allergies you have.  All medicines you are taking, including vitamins, herbs, eye drops, creams, and over-the-counter medicines.  Any problems you or family members have had with anesthetic medicines.  Any blood disorders you have.  Any surgeries you have had.  Any medical conditions you have.  Whether you are pregnant or may be pregnant. What are the risks? Generally, this is a safe procedure. However, problems may occur, including:  Infection.  Bleeding.  Allergic reactions to medicines or dyes used in the procedure.  Damage to other structures or organs. What happens before the procedure?  Follow instructions from your health care provider about eating or drinking restrictions.  Ask your health care provider about: ? Changing or stopping your regular medicines. This is especially important if you are taking diabetes medicines or blood thinners. ? Taking medicines such as aspirin and ibuprofen. These medicines can thin your blood. Do not take these medicines before your procedure if your health  care provider instructs you not to.  You may be given antibiotic medicine to help prevent infection.  You may have blood tests to see how well your kidneys and liver are working and to see how well your blood can clot.  Plan to have someone take you home from the hospital or clinic. What happens during the procedure?  To reduce your risk of infection: ? Your health care team will wash or sanitize their hands. ? Your skin will be washed with soap.  An IV tube will be inserted into one of your veins. You may be given medicines through this IV tube to help prevent nausea and pain.  You will be positioned on your abdomen.  You will be given one or more of the following: ? A medicine to help you relax (sedative). ? A medicine to numb the area (local anesthetic) where the nephrostomy tube will be inserted.  The nephrostomy tube, which is thin and flexible, will be inserted into a needle.  The needle will be inserted into your body and guided to your kidney. An imaging method that uses X-ray images (fluoroscopy) will be used to help guide the needle to the kidney.  A dye will be injected through the nephrostomy tube. Then, X-ray images that highlight your kidney will be taken.  Next, the needle will be removed, but the nephrostomy tube will be left in your kidney. The tube may be secured to your skin with stitches (sutures).  A drainage bag will be attached to the nephrostomy tube. Urine will be able to drain from your kidney to this drainage bag outside your body. The procedure may vary among health care providers  and hospitals. What happens after the procedure?  Your blood pressure, heart rate, breathing rate, and blood oxygen level will be monitored until the medicines you were given have worn off.  You will need to remain lying down for several hours.  You will be taught how to care for the nephrostomy tube and the drainage bag.  Donot drive for 24 hours if you were given a  sedative. This information is not intended to replace advice given to you by your health care provider. Make sure you discuss any questions you have with your health care provider. Document Released: 04/18/2013 Document Revised: 06/10/2017 Document Reviewed: 04/09/2016 Elsevier Patient Education  2020 [employer].

## 2019-04-12 NOTE — Procedures (Signed)
Pre Procedure Dx: Hydronephrosis Post Procedure Dx: Same  Successful bilateral PCN exchange.    EBL: None   No immediate complications.   Jay Ivo Moga, MD Pager #: [phone]  

## 2019-04-26 ENCOUNTER — Other Ambulatory Visit: Payer: Self-pay | Admitting: Radiology

## 2019-04-26 DIAGNOSIS — N1330 Unspecified hydronephrosis: Secondary | ICD-10-CM

## 2019-04-30 ENCOUNTER — Encounter: Payer: Self-pay | Admitting: Urology

## 2019-04-30 ENCOUNTER — Ambulatory Visit: Payer: Medicare Other | Admitting: Urology

## 2019-04-30 ENCOUNTER — Other Ambulatory Visit: Payer: Self-pay

## 2019-04-30 VITALS — BP 100/64 | HR 106 | Ht 74.0 in

## 2019-04-30 DIAGNOSIS — N1330 Unspecified hydronephrosis: Secondary | ICD-10-CM

## 2019-04-30 NOTE — Progress Notes (Signed)
04/30/2019 4:41 PM   Blake [name] 1923-06-08 [id number]  Referring provider: Adin Hector, MD [facility] [place],  [place] [address]  Chief Complaint  Patient presents with  . Follow-up    HPI: Blake [name] is a 85 y.o. male who presents for follow-up.  He has a history of prostate cancer status post radiation therapy and longstanding bilateral hydronephrosis.  He developed acute renal failure with creatinine increasing from baseline 2.2-6.2.  There was no improvement with Foley catheter drainage.  Family elected bilateral percutaneous nephrostomy placement.  His tubes were last exchanged on 04/12/2019.  He presents today with his son.  His only complaint is superficial right flank pain at the tube insertion site.  He is not voiding per urethra.  Creatinine 03/22/2019 was back to baseline at 2.5.   PMH: Past Medical History:  Diagnosis Date  . AAA (abdominal aortic aneurysm) ([place])   . Arrhythmia   . CHF (congestive heart failure) ([place])   . Diabetes mellitus without complication ([place])   . GI bleed   . Heart murmur   . Hyperlipidemia   . Hypertension   . Pacemaker   . Prostate cancer [facility])     Surgical History: Past Surgical History:  Procedure Laterality Date  . FLEXIBLE SIGMOIDOSCOPY N/A 10/21/2016   Procedure: FLEXIBLE SIGMOIDOSCOPY;  Surgeon: [name], MD;  Location: [facility] ENDOSCOPY;  Service: Endoscopy;  Laterality: N/A;  . IR NEPHROSTOGRAM LEFT THRU EXISTING ACCESS  01/16/2019  . IR NEPHROSTOMY EXCHANGE LEFT  02/06/2019  . IR NEPHROSTOMY EXCHANGE LEFT  04/12/2019  . IR NEPHROSTOMY EXCHANGE RIGHT  02/06/2019  . IR NEPHROSTOMY EXCHANGE RIGHT  04/12/2019  . IR NEPHROSTOMY PLACEMENT RIGHT  01/01/2019  . JOINT REPLACEMENT    . NEPHROSTOMY Bilateral   . PACEMAKER INSERTION Left 04/14/2015   Procedure: INSERTION PACEMAKER;  Surgeon: Isaias Cowman, MD;  Location: [facility] ORS;  Service: Cardiovascular;  Laterality: Left;  . PROSTATE  SURGERY    . VISCERAL ARTERY INTERVENTION N/A 10/22/2016   Procedure: Visceral Artery Intervention;  Surgeon: Algernon Huxley, MD;  Location: [facility] [facility] LAB;  Service: Cardiovascular;  Laterality: N/A;    Home Medications:  Allergies as of 04/30/2019      Reactions   Cefepime    seizure   Diovan [valsartan] Cough   Gabapentin Other (See Comments)   Sedation at all doses   Hydralazine Itching   Lisinopril Cough   Pregabalin Other (See Comments)   Sedation at all doses   Levofloxacin Other (See Comments)   Too many side effects. Nausea, vomiting, upset stomach, increased confusion, etc Other reaction(s): Confusion Nausea, chills Nausea, chills   Sulfamethoxazole-trimethoprim Other (See Comments)   Too many side effects. Nausea, vomiting, upset stomach, increased confusion, etc      Medication List       Accurate as of April 30, 2019  4:41 PM. If you have any questions, ask your nurse or doctor.        amLODipine 2.5 MG tablet Commonly known as: NORVASC Take 1 tablet (2.5 mg total) by mouth daily.   feeding supplement (GLUCERNA SHAKE) Liqd Take 237 mLs by mouth 2 (two) times daily between meals.   finasteride 5 MG tablet Commonly known as: PROSCAR Take 5 mg by mouth daily.   furosemide 20 MG tablet Commonly known as: LASIX   glimepiride 4 MG tablet Commonly known as: AMARYL   Metoprolol Tartrate 37.5 MG Tabs Take 37.5 mg by mouth 2 (  two) times daily.   pantoprazole 40 MG tablet Commonly known as: PROTONIX Take 40 mg by mouth daily.   potassium chloride 10 MEQ tablet Commonly known as: KLOR-CON Take by mouth.   PRESERVISION/LUTEIN PO Take 1 capsule by mouth 2 (two) times daily.   multivitamin-iron-minerals-folic acid chewable tablet Chew 2 tablets by mouth daily.   rosuvastatin 5 MG tablet Commonly known as: CRESTOR Take 5 mg by mouth at bedtime.   sitaGLIPtin 50 MG tablet Commonly known as: JANUVIA Take 0.5 tablets (25 mg total) by mouth daily.    sodium bicarbonate 650 MG tablet Take 1 tablet (650 mg total) by mouth 2 (two) times daily.   tamsulosin 0.4 MG Caps capsule Commonly known as: FLOMAX Take 0.4 mg by mouth daily.   torsemide 20 MG tablet Commonly known as: DEMADEX Take by mouth.   VITRON-C PO Take 1 tablet by mouth daily.       Allergies:  Allergies  Allergen Reactions  . Cefepime     seizure  . Diovan [Valsartan] Cough  . Gabapentin Other (See Comments)    Sedation at all doses  . Hydralazine Itching  . Lisinopril Cough  . Pregabalin Other (See Comments)    Sedation at all doses  . Levofloxacin Other (See Comments)    Too many side effects. Nausea, vomiting, upset stomach, increased confusion, etc Other reaction(s): Confusion Nausea, chills Nausea, chills   . Sulfamethoxazole-Trimethoprim Other (See Comments)    Too many side effects. Nausea, vomiting, upset stomach, increased confusion, etc    Family History: Family History  Problem Relation Age of Onset  . Hypertension Other   . Stroke Other   . Heart attack Other   . Stroke Sister   . Heart attack Brother     Social History:  reports that he has quit smoking. He has never used smokeless tobacco. He reports that he does not drink alcohol or use drugs.  ROS: UROLOGY Frequent Urination?: No Hard to postpone urination?: No Burning/pain with urination?: No Get up at night to urinate?: No Leakage of urine?: No Urine stream starts and stops?: No Trouble starting stream?: No Do you have to strain to urinate?: No Blood in urine?: No Urinary tract infection?: No Sexually transmitted disease?: No Injury to kidneys or bladder?: No Painful intercourse?: No Weak stream?: No Erection problems?: No Penile pain?: No  Gastrointestinal Nausea?: No Vomiting?: No Indigestion/heartburn?: No Diarrhea?: No Constipation?: No  Constitutional Fever: No Night sweats?: No Weight loss?: No Fatigue?: No  Skin Skin rash/lesions?: Yes  Itching?: No  Eyes Blurred vision?: No Double vision?: No  Ears/Nose/Throat Sore throat?: No Sinus problems?: No  Hematologic/Lymphatic Swollen glands?: No Easy bruising?: No  Cardiovascular Leg swelling?: No Chest pain?: No  Respiratory Cough?: No Shortness of breath?: No  Endocrine Excessive thirst?: No  Musculoskeletal Back pain?: No Joint pain?: No  Neurological Headaches?: No Dizziness?: No  Psychologic Depression?: No Anxiety?: No  Physical Exam: BP 100/64 (BP Location: Left Arm, Patient Position: Sitting, Cuff Size: Normal)   Pulse (!) 106   Ht 6\' 2"  (1.88 m)   BMI 26.32 kg/m   Constitutional:  Alert and oriented, No acute distress. HEENT: [place] AT, moist mucus membranes.  Trachea midline, no masses. Cardiovascular: No clubbing, cyanosis, or edema. Respiratory: Normal respiratory effort, no increased work of breathing. GI: Abdomen is soft, nontender, nondistended, no abdominal masses GU: Nephrostomy tubes draining clear urine.  The suture tethering right nephrostomy tube appeared to be the site of his tenderness and  was removed. Lymph: No cervical or inguinal lymphadenopathy. Skin: No rashes, bruises or suspicious lesions. Neurologic: Grossly intact, no focal deficits, moving all 4 extremities. Psychiatric: Normal mood and affect.   Assessment & Plan:    - Bilateral hydronephrosis He is scheduled for nephrostomy tube change on 05/25/2019   Abbie Sons, MD  [facility] [address] [place], [place] [address] [phone]

## 2019-05-08 ENCOUNTER — Encounter: Payer: Self-pay | Admitting: Urology

## 2019-05-08 ENCOUNTER — Telehealth: Payer: Self-pay | Admitting: Urology

## 2019-05-08 NOTE — Telephone Encounter (Signed)
Pt's daughter called and asked if she could get a call back concerning the area around the pt's tube, she states that it has turned really dark.

## 2019-05-08 NOTE — Telephone Encounter (Signed)
Patient's daughter notified , does not wish to make an apt at this time

## 2019-05-08 NOTE — Telephone Encounter (Signed)
Spoke with patient's daughter she states the area around his tube starting turning black on Friday and is worse today. She will try and send a picture to evaluate over mychart. She is worried the tissue is necrosing

## 2019-05-08 NOTE — Telephone Encounter (Signed)
The photo looks to me like ecchymosis and granulation tissue.  You can offer an appointment with Larene Beach or Sam tomorrow if daughter desires.

## 2019-05-25 ENCOUNTER — Telehealth: Payer: Self-pay | Admitting: Family Medicine

## 2019-05-25 ENCOUNTER — Encounter: Payer: Self-pay | Admitting: Urology

## 2019-05-25 ENCOUNTER — Telehealth: Payer: Self-pay | Admitting: Urology

## 2019-05-25 ENCOUNTER — Ambulatory Visit: Payer: Medicare Other

## 2019-05-25 NOTE — Telephone Encounter (Signed)
Pt.'s daughter states pt was scheduled to have nephrostomy tube changed today but IR called and rescheduled to next Friday 11/20. Daughter would like for someone to call her due to pt. Having blood in urine. Daughter states she would like a call as so as possible she doesn't want to take pt. To ER.  Daughter states she took a picture and would be glad to email it or send it to someone's phone.

## 2019-05-25 NOTE — Telephone Encounter (Signed)
Blake [name] is calling the patient .

## 2019-05-25 NOTE — Telephone Encounter (Signed)
Spoke to patient's daughter Mrs. Primus Bravo. She informed me that IR gave her a RX for Cipro and she started the ABX yesterday because he was having some blood in his urine. He is due to have a Nephrostomy tube exchange on 06/01/19. She states his urine is cloudy. I informed her that he may need to have a urine sample collected for Culture. She wanted to continue with the ABX over the weekend and she will contact our office if his urine is not getting better.

## 2019-05-29 NOTE — Progress Notes (Signed)
Patient for Bilateral Nephrostomy tube exchange, spoke with Dr Crisp/daughter with instructions given, questions answered, aware to be here 06/01/2019 @ 0930

## 2019-06-01 ENCOUNTER — Other Ambulatory Visit: Payer: Self-pay

## 2019-06-01 ENCOUNTER — Encounter: Payer: Self-pay | Admitting: Interventional Radiology

## 2019-06-01 ENCOUNTER — Ambulatory Visit
Admission: RE | Admit: 2019-06-01 | Discharge: 2019-06-01 | Disposition: A | Discharge status: Home / Self Care | Payer: Medicare Other | Source: Ambulatory Visit | Attending: Urology | Admitting: Urology

## 2019-06-01 DIAGNOSIS — Z434 Encounter for attention to other artificial openings of digestive tract: Secondary | ICD-10-CM | POA: Insufficient documentation

## 2019-06-01 DIAGNOSIS — N1330 Unspecified hydronephrosis: Secondary | ICD-10-CM

## 2019-06-01 HISTORY — PX: IR NEPHROSTOMY EXCHANGE RIGHT: IMG6070

## 2019-06-01 HISTORY — PX: IR NEPHROSTOMY EXCHANGE LEFT: IMG6069

## 2019-06-01 MED ORDER — IODIXANOL 320 MG/ML IV SOLN
50.0000 mL | Freq: Once | INTRAVENOUS | Status: AC | PRN
  Administered 2019-06-01: 10 mL

## 2019-06-26 ENCOUNTER — Other Ambulatory Visit: Payer: Self-pay | Admitting: Radiology

## 2019-06-26 ENCOUNTER — Telehealth: Payer: Self-pay | Admitting: Urology

## 2019-06-26 DIAGNOSIS — N1330 Unspecified hydronephrosis: Secondary | ICD-10-CM

## 2019-06-26 NOTE — Telephone Encounter (Signed)
Explained to daughter that the next nephrostomy tube exchange is due 07/13/2019 and she will be contacted with an appointment closer to that time.  Daughter expresses understanding.

## 2019-06-26 NOTE — Telephone Encounter (Signed)
Pt needs an appt to have nephrostomy tube changed.

## 2019-07-20 ENCOUNTER — Other Ambulatory Visit: Payer: Self-pay

## 2019-07-20 ENCOUNTER — Ambulatory Visit
Admission: RE | Admit: 2019-07-20 | Discharge: 2019-07-20 | Disposition: A | Discharge status: Home / Self Care | Payer: Medicare PPO | Source: Ambulatory Visit | Attending: Urology | Admitting: Urology

## 2019-07-20 DIAGNOSIS — N1330 Unspecified hydronephrosis: Secondary | ICD-10-CM | POA: Insufficient documentation

## 2019-07-20 DIAGNOSIS — Z436 Encounter for attention to other artificial openings of urinary tract: Secondary | ICD-10-CM | POA: Diagnosis present

## 2019-07-20 HISTORY — PX: IR NEPHROSTOMY EXCHANGE LEFT: IMG6069

## 2019-07-20 HISTORY — PX: IR NEPHROSTOMY EXCHANGE RIGHT: IMG6070

## 2019-07-20 MED ORDER — IOPAMIDOL (ISOVUE-300) INJECTION 61%: 30.0000 mL | Freq: Once | INTRAVENOUS | Status: DC | PRN

## 2019-07-20 NOTE — Progress Notes (Signed)
Patient clinically stable post bil. Nephrostomy tube exchange per Dr Pascal Lux. I personally stayed with unsedated patient during procedure, appeared to tolerate with more local per Dr Pascal Lux. Taken back to get ready for discharge with son present. Vitals stable post procedure. Discharge in structions with questions answered. Extra neph bags upon discharge with dressing/gauze.

## 2019-07-20 NOTE — Procedures (Signed)
Pre Procedure Dx: Hydronephrosis Post Procedure Dx: Same  Successful bilateral PCN exchange and upsizing to 12 Fr.  EBL: None No immediate complications.   Ronny Bacon, MD Pager #: [phone]

## 2019-08-13 ENCOUNTER — Telehealth: Payer: Self-pay | Admitting: Radiology

## 2019-08-13 ENCOUNTER — Other Ambulatory Visit: Payer: Self-pay | Admitting: Internal Medicine

## 2019-08-13 ENCOUNTER — Other Ambulatory Visit: Payer: Self-pay | Admitting: *Deleted

## 2019-08-13 DIAGNOSIS — N1330 Unspecified hydronephrosis: Secondary | ICD-10-CM

## 2019-08-13 NOTE — Telephone Encounter (Signed)
STAT referral received from Dr Ramonita Lab for gross blood in right nephrostomy tube. Patient sees Dr Bernardo Heater. Unable to reach daughter for more information and voicemail is full. Left message with son to have her call back.

## 2019-08-13 NOTE — Telephone Encounter (Signed)
Contacted IR regarding hematuria. An appointment was made for a nephrostomy tube placement check for 08/14/2019. Awaiting return call from daughter to inform her.

## 2019-08-13 NOTE — Telephone Encounter (Signed)
Notified daughter of nephrostomy tube placement check scheduled 08/14/2019. Daughter states she can not make it to the appointment. New appointment was given for 08/15/2019. Daughter is in agreement with this appointment.

## 2019-08-14 NOTE — Telephone Encounter (Signed)
Noted, agree with nephrostogram

## 2019-08-15 ENCOUNTER — Other Ambulatory Visit: Payer: Self-pay

## 2019-08-15 ENCOUNTER — Ambulatory Visit
Admission: RE | Admit: 2019-08-15 | Discharge: 2019-08-15 | Disposition: A | Discharge status: Home / Self Care | Payer: Medicare PPO | Source: Ambulatory Visit | Attending: Internal Medicine | Admitting: Internal Medicine

## 2019-08-15 DIAGNOSIS — N1330 Unspecified hydronephrosis: Secondary | ICD-10-CM

## 2019-08-15 NOTE — Progress Notes (Signed)
Chief Complaint: Patient was seen in consultation today for nephrostomy tube issues at the request of Calvert J III  Referring Physician(s): Klein,Bert J III  History of Present Illness: GLENNON KOPKO is a 86 y.o. male with history of prostate cancer and bilateral nephrostomy tubes.  Nephrostomy tubes were recently exchanged and upsized on 07/20/2019.  Patient does not tolerate the tube changes well.  Family noticed a large amount of blood in the right nephrostomy tube bag recently.  Patient was scheduled for a nephrostogram and possible tube exchange but family and patient are adamant that they do not want the tube changed today.  Past Medical History:  Diagnosis Date   AAA (abdominal aortic aneurysm) (HCC)    Arrhythmia    CHF (congestive heart failure) (HCC)    Diabetes mellitus without complication (HCC)    GI bleed    Heart murmur    Hyperlipidemia    Hypertension    Pacemaker    Prostate cancer [facility])     Past Surgical History:  Procedure Laterality Date   FLEXIBLE SIGMOIDOSCOPY N/A 10/21/2016   Procedure: FLEXIBLE SIGMOIDOSCOPY;  Surgeon: [name], MD;  Location: [facility] ENDOSCOPY;  Service: Endoscopy;  Laterality: N/A;   IR NEPHROSTOGRAM LEFT THRU EXISTING ACCESS  01/16/2019   IR NEPHROSTOMY EXCHANGE LEFT  02/06/2019   IR NEPHROSTOMY EXCHANGE LEFT  04/12/2019   IR NEPHROSTOMY EXCHANGE LEFT  06/01/2019   IR NEPHROSTOMY EXCHANGE LEFT  07/20/2019   IR NEPHROSTOMY EXCHANGE RIGHT  02/06/2019   IR NEPHROSTOMY EXCHANGE RIGHT  04/12/2019   IR NEPHROSTOMY EXCHANGE RIGHT  06/01/2019   IR NEPHROSTOMY EXCHANGE RIGHT  07/20/2019   IR NEPHROSTOMY PLACEMENT RIGHT  01/01/2019   JOINT REPLACEMENT     NEPHROSTOMY Bilateral    PACEMAKER INSERTION Left 04/14/2015   Procedure: INSERTION PACEMAKER;  Surgeon: Isaias Cowman, MD;  Location: [facility] ORS;  Service: Cardiovascular;  Laterality: Left;   PROSTATE SURGERY     VISCERAL ARTERY INTERVENTION N/A 10/22/2016   Procedure: Visceral Artery Intervention;  Surgeon: Algernon Huxley, MD;  Location: [place] [facility];  Service: Cardiovascular;  Laterality: N/A;    Allergies: Cefepime, Diovan [valsartan], Gabapentin, Hydralazine, Lisinopril, Pregabalin, Levofloxacin, and Sulfamethoxazole-trimethoprim  Medications: Prior to Admission medications   Medication Sig Start Date End Date Taking? Authorizing Provider  amLODipine (NORVASC) 2.5 MG tablet Take 1 tablet (2.5 mg total) by mouth daily. 11/10/18   Hillary Bow, MD  ciprofloxacin (CIPRO) 250 MG tablet Take 250 mg by mouth once.    [provider]  feeding supplement, GLUCERNA SHAKE, (GLUCERNA SHAKE) LIQD Take 237 mLs by mouth 2 (two) times daily between meals. 01/01/19   Fritzi Mandes, MD  finasteride (PROSCAR) 5 MG tablet Take 5 mg by mouth daily.    [provider]  furosemide (LASIX) 20 MG tablet  03/13/19   [provider]  glimepiride (AMARYL) 4 MG tablet  09/13/18   [provider]  Iron-Vitamin C (VITRON-C PO) Take 1 tablet by mouth daily.    [provider]  metoprolol tartrate 37.5 MG TABS Take 37.5 mg by mouth 2 (two) times daily. 09/13/17   Gladstone Lighter, MD  Multiple Vitamins-Minerals (PRESERVISION/LUTEIN PO) Take 1 capsule by mouth 2 (two) times daily.    [provider]  multivitamin-iron-minerals-folic acid (CENTRUM) chewable tablet Chew 2 tablets by mouth daily.    [provider]  pantoprazole (PROTONIX) 40 MG tablet Take 40 mg by mouth daily.    [provider]  potassium chloride (  KLOR-CON) 10 MEQ tablet Take by mouth. 03/22/19 03/28/20  [provider]  rosuvastatin (CRESTOR) 5 MG tablet Take 5 mg by mouth at bedtime.    [provider]  sitaGLIPtin (JANUVIA) 50 MG tablet Take 0.5 tablets (25 mg total) by mouth daily. 01/01/19   Fritzi Mandes, MD  sodium bicarbonate 650 MG tablet Take 1 tablet (650 mg total) by mouth 2 (two) times daily. 09/13/17   Gladstone Lighter, MD  tamsulosin (FLOMAX) 0.4 MG CAPS capsule Take 0.4 mg by mouth daily.     [provider]  torsemide (DEMADEX) 20 MG tablet Take by mouth. 03/29/19 03/28/20  [provider]     Family History  Problem Relation Age of Onset   Hypertension Other    Stroke Other    Heart attack Other    Stroke Sister    Heart attack Brother     Social History   Socioeconomic History   Marital status: Married    Spouse name: Not on file   Number of children: Not on file   Years of education: Not on file   Highest education level: Not on file  Occupational History   Not on file  Tobacco Use   Smoking status: Former Smoker   Smokeless tobacco: Never Used  Substance and Sexual Activity   Alcohol use: No   Drug use: No   Sexual activity: Not Currently  Other Topics Concern   Not on file  Social History Narrative   Not on file   Social Determinants of Health   Financial Resource Strain:    Difficulty of Paying Living Expenses: Not on file  Food Insecurity:    Worried About Charity fundraiser in the Last Year: Not on file   [place] in the Last Year: Not on file  Transportation Needs:    Lack of Transportation (Medical): Not on file   Lack of Transportation (Non-Medical): Not on file  Physical Activity:    Days of Exercise per Week: Not on file   Minutes of Exercise per Session: Not on file  Stress:    Feeling of Stress : Not on file  Social Connections:    Frequency of Communication with Friends and Family: Not on file   Frequency of Social Gatherings with Friends and Family: Not on file   Attends Religious Services: Not on file   Active Member of Clubs or Organizations: Not on file   Attends Archivist Meetings: Not on file   Marital Status: Not on file      Review of Systems  Vital Signs: There were no vitals taken for this visit.  Physical Exam Abdominal:     Comments: Right nephrostomy tube was  examined.  The retention suture is still attached to the catheter but no longer attached to the skin.  The tube has not been significantly displaced based on the position of the suture.  Blood-tinged urine is present in the right nephrostomy tube bag.  The skin around the right nephrostomy tube looks healthy.  There is no drainage or bleeding.        Imaging: IR NEPHROSTOMY EXCHANGE LEFT  Result Date: 07/20/2019 INDICATION: Routine exchange. Per my discussion with the patient's son prior to the procedure, the patient tolerates nephrostomy catheter exchanges poorly primarily due to his discomfort lying prone on the fluoroscopy table. As such, at the nephrostomy catheters are going to be chronic, I will proceed with up sizing the nephrostomy catheters from  10-12 [place] in hopes of increasing the time interval between routine exchanges. EXAM: FLUOROSCOPIC GUIDED BILATERAL SIDED NEPHROSTOMY CATHETER EXCHANGE AND UP SIZING COMPARISON:  Multiple previous fluoroscopic guided nephrostomy catheter exchanges, most recently on 06/01/2019 CONTRAST:  A total of 20 mL Omnipaque 300 administered was administered into both collecting systems FLUOROSCOPY TIME:  2 minutes, 36 seconds (17.7 mGy) COMPLICATIONS: None immediate. TECHNIQUE: Informed written consent was obtained from the patient and the patient's son after a discussion of the risks, benefits and alternatives to treatment. Questions regarding the procedure were encouraged and answered. A timeout was performed prior to the initiation of the procedure. The bilateral flanks and external portions of existing nephrostomy catheters were prepped and draped in the usual sterile fashion. A sterile drape was applied covering the operative field. Maximum barrier sterile technique with sterile gowns and gloves were used for the procedure. A timeout was performed prior to the initiation of the procedure. A pre procedural spot fluoroscopic image was obtained. Beginning with the  left-sided nephrostomy, a small amount of contrast was injected via the existing left-sided nephrostomy catheter demonstrating appropriate positioning within the renal pelvis. The existing nephrostomy catheter was cut and cannulated with a Benson wire which was coiled within the renal pelvis. Under intermittent fluoroscopic guidance, the existing nephrostomy catheter was exchanged for a new, slightly larger now 12 [place] all-purpose drainage catheter. Limited contrast injection confirmed appropriate positioning within the left renal pelvis and a post exchange fluoroscopic image was obtained. The catheter was locked, secured to the skin with an interrupted suture and reconnected to a gravity bag. The identical repeat procedure was repeated for the contralateral right-sided nephrostomy, ultimately allowing successful exchange of a new, slightly larger now 12 [place] all-purpose drainage catheter with end coiled and locked within the right renal pelvis. Dressings were placed. The patient tolerated the above procedures well without immediate postprocedural complication. FINDINGS: The existing nephrostomy catheters are appropriately positioned and functioning. After successful fluoroscopic guided exchange, new bilateral slightly larger now 12 [place] nephrostomy catheters are coiled and locked within the respective renal pelvises. IMPRESSION: Successful fluoroscopic guided exchange and up sizing of bilateral, now slightly larger, now bilateral 12 French percutaneous nephrostomy catheters. PLAN: As the nephrostomy catheters have been up sized, we will attempt to delayed the patient's next scheduled routine fluoroscopic guided exchange by 2 weeks. The patient's son knows to call the interventional radiology department if they would like the catheter is exchanged sooner or with any additional questions or concerns. Electronically Signed   By: Sandi Mariscal M.D.   On: 07/20/2019 12:20   IR NEPHROSTOMY EXCHANGE RIGHT  Result  Date: 07/20/2019 INDICATION: Routine exchange. Per my discussion with the patient's son prior to the procedure, the patient tolerates nephrostomy catheter exchanges poorly primarily due to his discomfort lying prone on the fluoroscopy table. As such, at the nephrostomy catheters are going to be chronic, I will proceed with up sizing the nephrostomy catheters from 10-12 [place] in hopes of increasing the time interval between routine exchanges. EXAM: FLUOROSCOPIC GUIDED BILATERAL SIDED NEPHROSTOMY CATHETER EXCHANGE AND UP SIZING COMPARISON:  Multiple previous fluoroscopic guided nephrostomy catheter exchanges, most recently on 06/01/2019 CONTRAST:  A total of 20 mL Omnipaque 300 administered was administered into both collecting systems FLUOROSCOPY TIME:  2 minutes, 36 seconds (93.9 mGy) COMPLICATIONS: None immediate. TECHNIQUE: Informed written consent was obtained from the patient and the patient's son after a discussion of the risks, benefits and alternatives to treatment. Questions regarding the procedure were encouraged and answered. A  timeout was performed prior to the initiation of the procedure. The bilateral flanks and external portions of existing nephrostomy catheters were prepped and draped in the usual sterile fashion. A sterile drape was applied covering the operative field. Maximum barrier sterile technique with sterile gowns and gloves were used for the procedure. A timeout was performed prior to the initiation of the procedure. A pre procedural spot fluoroscopic image was obtained. Beginning with the left-sided nephrostomy, a small amount of contrast was injected via the existing left-sided nephrostomy catheter demonstrating appropriate positioning within the renal pelvis. The existing nephrostomy catheter was cut and cannulated with a Benson wire which was coiled within the renal pelvis. Under intermittent fluoroscopic guidance, the existing nephrostomy catheter was exchanged for a new, slightly  larger now 12 [place] all-purpose drainage catheter. Limited contrast injection confirmed appropriate positioning within the left renal pelvis and a post exchange fluoroscopic image was obtained. The catheter was locked, secured to the skin with an interrupted suture and reconnected to a gravity bag. The identical repeat procedure was repeated for the contralateral right-sided nephrostomy, ultimately allowing successful exchange of a new, slightly larger now 12 [place] all-purpose drainage catheter with end coiled and locked within the right renal pelvis. Dressings were placed. The patient tolerated the above procedures well without immediate postprocedural complication. FINDINGS: The existing nephrostomy catheters are appropriately positioned and functioning. After successful fluoroscopic guided exchange, new bilateral slightly larger now 12 French nephrostomy catheters are coiled and locked within the respective renal pelvises. IMPRESSION: Successful fluoroscopic guided exchange and up sizing of bilateral, now slightly larger, now bilateral 12 French percutaneous nephrostomy catheters. PLAN: As the nephrostomy catheters have been up sized, we will attempt to delayed the patient's next scheduled routine fluoroscopic guided exchange by 2 weeks. The patient's son knows to call the interventional radiology department if they would like the catheter is exchanged sooner or with any additional questions or concerns. Electronically Signed   By: Sandi Mariscal M.D.   On: 07/20/2019 12:20    Labs:  CBC: Recent Labs    01/05/19 0515 01/06/19 0459 01/08/19 0549 01/09/19 0524  WBC 16.4* 12.3* 9.1 8.9  HGB 7.3* 7.4* 7.4* 7.3*  HCT 23.5* 23.8* 23.8* 23.5*  PLT 106* 117* 124* 124*    COAGS: Recent Labs    12/26/18 0255 01/02/19 2014  INR 1.4* 1.3*  APTT  --  34    BMP: Recent Labs    01/06/19 0459 01/07/19 0525 01/08/19 0549 01/09/19 0524  NA 140 143 143 142   142  K 4.0 4.2 4.3 4.0   4.0  CL 108  111 113* 109   109  CO2 22 23 22 27   26   GLUCOSE 149* 171* 173* 150*   150*  BUN 68* 65* 65* 58*   60*  CALCIUM 8.3* 8.4* 8.3* 8.3*   8.4*  CREATININE 4.78* 4.37* 4.18* 3.83*   3.79*  GFRNONAA 10* 11* 11* 13*   13*  GFRAA 11* 12* 13* 15*   15*    LIVER FUNCTION TESTS: Recent Labs    11/05/18 1530 11/05/18 1530 11/21/18 1948 11/23/18 0345 12/25/18 0615 12/25/18 0615 12/28/18 0337 12/31/18 1009 01/02/19 2014 01/09/19 0524  BILITOT 0.8  --  0.4  --  0.5  --   --   --  0.3  --   AST 16  --  34  --  16  --   --   --  25  --   ALT 15  --  32  --  18  --   --   --  20  --   ALKPHOS 75  --  82  --  67  --   --   --  63  --   PROT 7.5  --  7.7  --  7.8  --   --   --  8.2*  --   ALBUMIN 3.0*   < > 2.9*   < > 3.0*   < > 2.6* 2.9* 3.1* 2.3*   < > = values in this interval not displayed.    TUMOR MARKERS: No results for input(s): AFPTM, CEA, CA199, CHROMGRNA in the last 8760 hours.  Assessment and Plan:  Right nephrostomy tube suture has broken and no longer sutured to the skin.  Small amount of blood-tinged urine in the right nephrostomy bag.  Explained to the patient and family that the blood could be related to nephrostomy tube motion particularly if the suture is no longer intact.  I offered to do a right nephrostogram to confirm that the nephrostomy tube is well-positioned but the family declined.  The patient is very hesitant about getting on the interventional table and having the tubes changed.  I suspect that the tube is appropriately positioned because it is draining urine and the tube does not appear to be significantly displaced based on the remaining suture on the tube.  Family and patient were willing to let me re-suture the catheter to the skin.  The skin around the nephrostomy tube was prepped with chlorhexidine.  The skin was anesthetized with 1% Xylocaine.  A Prolene suture was used to secure the catheter to the skin.  We will schedule the patient for routine bilateral  nephrostomy tube exchanges in late March or early April.  Electronically Signed: Burman Riis 08/15/2019, 1:43 PM   I spent a total of  20 minutes  in face to face in clinical consultation, greater than 50% of which was counseling/coordinating care for right nephrostomy tube care.  Patient ID: [name], male   DOB: [birth year], 86 y.o.   MRN: [id number]

## 2019-08-24 ENCOUNTER — Other Ambulatory Visit: Payer: Self-pay | Admitting: Internal Medicine

## 2019-08-24 DIAGNOSIS — R140 Abdominal distension (gaseous): Secondary | ICD-10-CM

## 2019-08-29 ENCOUNTER — Ambulatory Visit: Payer: Self-pay | Admitting: Urology

## 2019-08-30 ENCOUNTER — Ambulatory Visit: Payer: Medicare Other | Admitting: Urology

## 2019-08-31 ENCOUNTER — Ambulatory Visit
Admission: RE | Admit: 2019-08-31 | Discharge: 2019-08-31 | Disposition: A | Discharge status: Home / Self Care | Payer: Medicare PPO | Source: Ambulatory Visit | Attending: Internal Medicine | Admitting: Internal Medicine

## 2019-08-31 ENCOUNTER — Other Ambulatory Visit: Payer: Self-pay

## 2019-08-31 DIAGNOSIS — J90 Pleural effusion, not elsewhere classified: Secondary | ICD-10-CM | POA: Insufficient documentation

## 2019-08-31 DIAGNOSIS — K5730 Diverticulosis of large intestine without perforation or abscess without bleeding: Secondary | ICD-10-CM | POA: Diagnosis not present

## 2019-08-31 DIAGNOSIS — N201 Calculus of ureter: Secondary | ICD-10-CM | POA: Insufficient documentation

## 2019-08-31 DIAGNOSIS — Z936 Other artificial openings of urinary tract status: Secondary | ICD-10-CM | POA: Diagnosis not present

## 2019-08-31 DIAGNOSIS — I714 Abdominal aortic aneurysm, without rupture: Secondary | ICD-10-CM | POA: Insufficient documentation

## 2019-08-31 DIAGNOSIS — R140 Abdominal distension (gaseous): Secondary | ICD-10-CM | POA: Insufficient documentation

## 2019-08-31 DIAGNOSIS — R918 Other nonspecific abnormal finding of lung field: Secondary | ICD-10-CM | POA: Insufficient documentation

## 2019-09-03 ENCOUNTER — Other Ambulatory Visit: Payer: Self-pay | Admitting: Radiology

## 2019-09-03 DIAGNOSIS — N1330 Unspecified hydronephrosis: Secondary | ICD-10-CM

## 2019-09-05 ENCOUNTER — Institutional Professional Consult (permissible substitution): Payer: Medicare PPO | Admitting: Pulmonary Disease

## 2019-09-14 ENCOUNTER — Ambulatory Visit: Admission: RE | Admit: 2019-09-14 | Payer: Medicare PPO | Source: Ambulatory Visit

## 2019-09-19 ENCOUNTER — Other Ambulatory Visit: Payer: Self-pay

## 2019-09-19 ENCOUNTER — Other Ambulatory Visit: Payer: Self-pay | Admitting: Urology

## 2019-09-19 ENCOUNTER — Telehealth: Payer: Self-pay | Admitting: Radiology

## 2019-09-19 ENCOUNTER — Other Ambulatory Visit (HOSPITAL_COMMUNITY): Payer: Self-pay | Admitting: Interventional Radiology

## 2019-09-19 ENCOUNTER — Other Ambulatory Visit: Payer: Self-pay | Admitting: Radiology

## 2019-09-19 ENCOUNTER — Ambulatory Visit (HOSPITAL_COMMUNITY)
Admission: RE | Admit: 2019-09-19 | Discharge: 2019-09-19 | Disposition: A | Discharge status: Home / Self Care | Payer: Medicare PPO | Source: Ambulatory Visit | Attending: Urology | Admitting: Urology

## 2019-09-19 DIAGNOSIS — Z436 Encounter for attention to other artificial openings of urinary tract: Secondary | ICD-10-CM | POA: Insufficient documentation

## 2019-09-19 DIAGNOSIS — N1330 Unspecified hydronephrosis: Secondary | ICD-10-CM | POA: Diagnosis not present

## 2019-09-19 HISTORY — PX: IR NEPHROSTOMY EXCHANGE LEFT: IMG6069

## 2019-09-19 HISTORY — PX: IR NEPHROSTOMY EXCHANGE RIGHT: IMG6070

## 2019-09-19 MED ORDER — LIDOCAINE HCL 1 % IJ SOLN
INTRAMUSCULAR | Status: AC
  Filled 2019-09-19: qty 20

## 2019-09-19 MED ORDER — IOHEXOL 300 MG/ML  SOLN
50.0000 mL | Freq: Once | INTRAMUSCULAR | Status: AC | PRN
  Administered 2019-09-19: 15 mL

## 2019-09-19 NOTE — Procedures (Signed)
Pre Procedure Dx: Hydronephrosis Post Procedure Dx: Same  Successful fluoro guided left sided PCN replacement. Successful fluoro guided right sided PCN exchange.    EBL: None No immediate complications.   Ronny Bacon, MD Pager #: [phone]

## 2019-09-19 NOTE — Telephone Encounter (Signed)
Patient's daughter called to report that left nephrostomy tube was inadvertently removed last night. Awaiting call back from Interventional Radiology for replacement.

## 2019-09-24 ENCOUNTER — Emergency Department: Payer: Medicare PPO

## 2019-09-24 ENCOUNTER — Inpatient Hospital Stay
Admission: EM | Admit: 2019-09-24 | Discharge: 2019-09-28 | DRG: 689 | Disposition: A | Discharge status: Home Health | Payer: Medicare PPO | Attending: Internal Medicine | Admitting: Internal Medicine

## 2019-09-24 ENCOUNTER — Encounter: Payer: Self-pay | Admitting: Emergency Medicine

## 2019-09-24 ENCOUNTER — Other Ambulatory Visit: Payer: Self-pay

## 2019-09-24 DIAGNOSIS — G9341 Metabolic encephalopathy: Secondary | ICD-10-CM | POA: Diagnosis present

## 2019-09-24 DIAGNOSIS — Z950 Presence of cardiac pacemaker: Secondary | ICD-10-CM

## 2019-09-24 DIAGNOSIS — B9562 Methicillin resistant Staphylococcus aureus infection as the cause of diseases classified elsewhere: Secondary | ICD-10-CM | POA: Diagnosis present

## 2019-09-24 DIAGNOSIS — E785 Hyperlipidemia, unspecified: Secondary | ICD-10-CM | POA: Diagnosis present

## 2019-09-24 DIAGNOSIS — I480 Paroxysmal atrial fibrillation: Secondary | ICD-10-CM | POA: Diagnosis present

## 2019-09-24 DIAGNOSIS — I10 Essential (primary) hypertension: Secondary | ICD-10-CM | POA: Diagnosis present

## 2019-09-24 DIAGNOSIS — G9340 Encephalopathy, unspecified: Secondary | ICD-10-CM

## 2019-09-24 DIAGNOSIS — N136 Pyonephrosis: Secondary | ICD-10-CM | POA: Diagnosis present

## 2019-09-24 DIAGNOSIS — L89322 Pressure ulcer of left buttock, stage 2: Secondary | ICD-10-CM | POA: Diagnosis present

## 2019-09-24 DIAGNOSIS — Z79899 Other long term (current) drug therapy: Secondary | ICD-10-CM

## 2019-09-24 DIAGNOSIS — Z7984 Long term (current) use of oral hypoglycemic drugs: Secondary | ICD-10-CM

## 2019-09-24 DIAGNOSIS — E1122 Type 2 diabetes mellitus with diabetic chronic kidney disease: Secondary | ICD-10-CM | POA: Diagnosis present

## 2019-09-24 DIAGNOSIS — N3000 Acute cystitis without hematuria: Secondary | ICD-10-CM

## 2019-09-24 DIAGNOSIS — I4891 Unspecified atrial fibrillation: Secondary | ICD-10-CM | POA: Diagnosis present

## 2019-09-24 DIAGNOSIS — I495 Sick sinus syndrome: Secondary | ICD-10-CM | POA: Diagnosis present

## 2019-09-24 DIAGNOSIS — L89611 Pressure ulcer of right heel, stage 1: Secondary | ICD-10-CM | POA: Diagnosis present

## 2019-09-24 DIAGNOSIS — I714 Abdominal aortic aneurysm, without rupture, unspecified: Secondary | ICD-10-CM | POA: Diagnosis present

## 2019-09-24 DIAGNOSIS — L89159 Pressure ulcer of sacral region, unspecified stage: Secondary | ICD-10-CM

## 2019-09-24 DIAGNOSIS — N184 Chronic kidney disease, stage 4 (severe): Secondary | ICD-10-CM | POA: Diagnosis not present

## 2019-09-24 DIAGNOSIS — Z8249 Family history of ischemic heart disease and other diseases of the circulatory system: Secondary | ICD-10-CM

## 2019-09-24 DIAGNOSIS — Z8546 Personal history of malignant neoplasm of prostate: Secondary | ICD-10-CM | POA: Diagnosis not present

## 2019-09-24 DIAGNOSIS — L8992 Pressure ulcer of unspecified site, stage 2: Secondary | ICD-10-CM

## 2019-09-24 DIAGNOSIS — R011 Cardiac murmur, unspecified: Secondary | ICD-10-CM | POA: Diagnosis present

## 2019-09-24 DIAGNOSIS — G4733 Obstructive sleep apnea (adult) (pediatric): Secondary | ICD-10-CM | POA: Diagnosis not present

## 2019-09-24 DIAGNOSIS — Z881 Allergy status to other antibiotic agents status: Secondary | ICD-10-CM

## 2019-09-24 DIAGNOSIS — I130 Hypertensive heart and chronic kidney disease with heart failure and stage 1 through stage 4 chronic kidney disease, or unspecified chronic kidney disease: Secondary | ICD-10-CM | POA: Diagnosis present

## 2019-09-24 DIAGNOSIS — Z923 Personal history of irradiation: Secondary | ICD-10-CM | POA: Diagnosis not present

## 2019-09-24 DIAGNOSIS — I509 Heart failure, unspecified: Secondary | ICD-10-CM | POA: Diagnosis present

## 2019-09-24 DIAGNOSIS — R4182 Altered mental status, unspecified: Secondary | ICD-10-CM | POA: Diagnosis not present

## 2019-09-24 DIAGNOSIS — D696 Thrombocytopenia, unspecified: Secondary | ICD-10-CM | POA: Diagnosis present

## 2019-09-24 DIAGNOSIS — D649 Anemia, unspecified: Secondary | ICD-10-CM | POA: Diagnosis not present

## 2019-09-24 DIAGNOSIS — R531 Weakness: Secondary | ICD-10-CM | POA: Diagnosis present

## 2019-09-24 DIAGNOSIS — Z936 Other artificial openings of urinary tract status: Secondary | ICD-10-CM | POA: Diagnosis not present

## 2019-09-24 DIAGNOSIS — M1710 Unilateral primary osteoarthritis, unspecified knee: Secondary | ICD-10-CM | POA: Diagnosis present

## 2019-09-24 DIAGNOSIS — L8993 Pressure ulcer of unspecified site, stage 3: Secondary | ICD-10-CM

## 2019-09-24 DIAGNOSIS — Z20822 Contact with and (suspected) exposure to covid-19: Secondary | ICD-10-CM | POA: Diagnosis present

## 2019-09-24 DIAGNOSIS — L89302 Pressure ulcer of unspecified buttock, stage 2: Secondary | ICD-10-CM | POA: Diagnosis not present

## 2019-09-24 DIAGNOSIS — N390 Urinary tract infection, site not specified: Secondary | ICD-10-CM | POA: Diagnosis not present

## 2019-09-24 DIAGNOSIS — L89610 Pressure ulcer of right heel, unstageable: Secondary | ICD-10-CM

## 2019-09-24 DIAGNOSIS — Z8679 Personal history of other diseases of the circulatory system: Secondary | ICD-10-CM

## 2019-09-24 DIAGNOSIS — D631 Anemia in chronic kidney disease: Secondary | ICD-10-CM | POA: Diagnosis present

## 2019-09-24 DIAGNOSIS — Z882 Allergy status to sulfonamides status: Secondary | ICD-10-CM

## 2019-09-24 DIAGNOSIS — Z87891 Personal history of nicotine dependence: Secondary | ICD-10-CM

## 2019-09-24 DIAGNOSIS — Z888 Allergy status to other drugs, medicaments and biological substances status: Secondary | ICD-10-CM

## 2019-09-24 HISTORY — DX: Pneumonia, unspecified organism: J18.9

## 2019-09-24 HISTORY — DX: Sleep apnea, unspecified: G47.30

## 2019-09-24 HISTORY — DX: Gastro-esophageal reflux disease without esophagitis: K21.9

## 2019-09-24 LAB — CBC WITH DIFFERENTIAL/PLATELET
Abs Immature Granulocytes: 0.04 10*3/uL (ref 0.00–0.07)
Basophils Absolute: 0 10*3/uL (ref 0.0–0.1)
Basophils Relative: 1 %
Eosinophils Absolute: 0.1 10*3/uL (ref 0.0–0.5)
Eosinophils Relative: 2 %
HCT: 31.2 % — ABNORMAL LOW (ref 39.0–52.0)
Hemoglobin: 9.6 g/dL — ABNORMAL LOW (ref 13.0–17.0)
Immature Granulocytes: 1 %
Lymphocytes Relative: 8 %
Lymphs Abs: 0.5 10*3/uL — ABNORMAL LOW (ref 0.7–4.0)
MCH: 26.7 pg (ref 26.0–34.0)
MCHC: 30.8 g/dL (ref 30.0–36.0)
MCV: 86.7 fL (ref 80.0–100.0)
Monocytes Absolute: 0.5 10*3/uL (ref 0.1–1.0)
Monocytes Relative: 8 %
Neutro Abs: 4.9 10*3/uL (ref 1.7–7.7)
Neutrophils Relative %: 80 %
Platelets: 93 10*3/uL — ABNORMAL LOW (ref 150–400)
RBC: 3.6 MIL/uL — ABNORMAL LOW (ref 4.22–5.81)
RDW: 17.7 % — ABNORMAL HIGH (ref 11.5–15.5)
WBC: 6.1 10*3/uL (ref 4.0–10.5)
nRBC: 0 % (ref 0.0–0.2)

## 2019-09-24 LAB — LACTIC ACID, PLASMA: Lactic Acid, Venous: 1.7 mmol/L (ref 0.5–1.9)

## 2019-09-24 LAB — URINALYSIS, COMPLETE (UACMP) WITH MICROSCOPIC
Bilirubin Urine: NEGATIVE
Glucose, UA: NEGATIVE mg/dL
Ketones, ur: NEGATIVE mg/dL
Nitrite: NEGATIVE
Protein, ur: 30 mg/dL — AB
Specific Gravity, Urine: 1.011 (ref 1.005–1.030)
WBC, UA: >50 WBC/hpf — ABNORMAL HIGH (ref 0–5)
pH: 6 (ref 5.0–8.0)

## 2019-09-24 LAB — COMPREHENSIVE METABOLIC PANEL
ALT: 17 U/L (ref 0–44)
AST: 18 U/L (ref 15–41)
Albumin: 2.7 g/dL — ABNORMAL LOW (ref 3.5–5.0)
Alkaline Phosphatase: 86 U/L (ref 38–126)
Anion gap: 14 (ref 5–15)
BUN: 93 mg/dL — ABNORMAL HIGH (ref 8–23)
CO2: 27 mmol/L (ref 22–32)
Calcium: 7.9 mg/dL — ABNORMAL LOW (ref 8.9–10.3)
Chloride: 93 mmol/L — ABNORMAL LOW (ref 98–111)
Creatinine, Ser: 4.15 mg/dL — ABNORMAL HIGH (ref 0.61–1.24)
GFR calc Af Amer: 13 mL/min — ABNORMAL LOW (ref >60)
GFR calc non Af Amer: 11 mL/min — ABNORMAL LOW (ref >60)
Glucose, Bld: 156 mg/dL — ABNORMAL HIGH (ref 70–99)
Potassium: 3.3 mmol/L — ABNORMAL LOW (ref 3.5–5.1)
Sodium: 134 mmol/L — ABNORMAL LOW (ref 135–145)
Total Bilirubin: 0.8 mg/dL (ref 0.3–1.2)
Total Protein: 7.1 g/dL (ref 6.5–8.1)

## 2019-09-24 LAB — BRAIN NATRIURETIC PEPTIDE: B Natriuretic Peptide: 982 pg/mL — ABNORMAL HIGH (ref 0.0–100.0)

## 2019-09-24 MED ORDER — ROSUVASTATIN CALCIUM 5 MG PO TABS
5.0000 mg | ORAL_TABLET | Freq: Every day | ORAL | Status: DC
  Administered 2019-09-25 – 2019-09-27 (×3): 5 mg via ORAL
  Filled 2019-09-24 (×3): qty 1

## 2019-09-24 MED ORDER — METOPROLOL TARTRATE 25 MG PO TABS
37.5000 mg | ORAL_TABLET | Freq: Two times a day (BID) | ORAL | Status: DC
  Administered 2019-09-25 – 2019-09-28 (×8): 37.5 mg via ORAL
  Filled 2019-09-24 (×8): qty 2

## 2019-09-24 MED ORDER — SODIUM CHLORIDE 0.9 % IV SOLN
2.0000 g | Freq: Once | INTRAVENOUS | Status: AC
  Administered 2019-09-24: 2 g via INTRAVENOUS
  Filled 2019-09-24: qty 2

## 2019-09-24 MED ORDER — TORSEMIDE 20 MG PO TABS
40.0000 mg | ORAL_TABLET | Freq: Two times a day (BID) | ORAL | Status: DC
  Administered 2019-09-25 – 2019-09-28 (×7): 40 mg via ORAL
  Filled 2019-09-24 (×7): qty 2

## 2019-09-24 MED ORDER — SODIUM CHLORIDE 0.9 % IV SOLN
1.0000 g | INTRAVENOUS | Status: DC
  Filled 2019-09-24: qty 1

## 2019-09-24 MED ORDER — SODIUM BICARBONATE 650 MG PO TABS
650.0000 mg | ORAL_TABLET | Freq: Two times a day (BID) | ORAL | Status: DC
  Administered 2019-09-25 – 2019-09-28 (×7): 650 mg via ORAL
  Filled 2019-09-24 (×8): qty 1

## 2019-09-24 MED ORDER — ENOXAPARIN SODIUM 40 MG/0.4ML ~~LOC~~ SOLN: 40.0000 mg | SUBCUTANEOUS | Status: DC

## 2019-09-24 MED ORDER — TAMSULOSIN HCL 0.4 MG PO CAPS
0.4000 mg | ORAL_CAPSULE | Freq: Every day | ORAL | Status: DC
  Administered 2019-09-25 – 2019-09-28 (×4): 0.4 mg via ORAL
  Filled 2019-09-24 (×4): qty 1

## 2019-09-24 MED ORDER — FINASTERIDE 5 MG PO TABS
5.0000 mg | ORAL_TABLET | Freq: Every day | ORAL | Status: DC
  Administered 2019-09-25 – 2019-09-28 (×4): 5 mg via ORAL
  Filled 2019-09-24 (×4): qty 1

## 2019-09-24 MED ORDER — PANTOPRAZOLE SODIUM 40 MG PO TBEC
40.0000 mg | DELAYED_RELEASE_TABLET | Freq: Every day | ORAL | Status: DC
  Administered 2019-09-25 – 2019-09-28 (×4): 40 mg via ORAL
  Filled 2019-09-24 (×4): qty 1

## 2019-09-24 NOTE — ED Notes (Signed)
Per lab, labels on blood tubes were unreadable and tubes must be drawn again. Redrew light green, lavender, blue and grey tubes and sent to lab

## 2019-09-24 NOTE — ED Notes (Signed)
Pt otf for imaging 

## 2019-09-24 NOTE — ED Notes (Signed)
Admitting MD at bedside.

## 2019-09-24 NOTE — Consult Note (Signed)
Pharmacy Antibiotic Note  Blake [name] is a 86 y.o. male admitted on 09/24/2019 with UTI.  Patient has history of pseudomonas in blood previously. Has had a reaction to Cefepime in the past but has tolerated other cephalosporins. Recently had both nephrostomy tubes replaced a week ago after one dislodged. Patient not on scheduled dialysis outpatient. Pharmacy has been consulted for Ceftazidime dosing.  Plan: Patient received Ceftazidime 2g IV x 1 dose in ED.  Due to poor renal function, will order Ceftazidime 1g IV Q24 hours.  May adjust dose depending on renal function improvement.  Height: 6\' 2"  (188 cm) Weight: 215 lb (97.5 kg) IBW/kg (Calculated) : 82.2  Temp (24hrs), Avg:98.5 F (36.9 C), Min:97.7 F (36.5 C), Max:99.2 F (37.3 C)  Recent Labs  Lab 09/24/19 1530  WBC 6.1  CREATININE 4.15*  LATICACIDVEN 1.7    Estimated Creatinine Clearance: 12.7 mL/min (A) (by C-G formula based on SCr of 4.15 mg/dL (H)).    Allergies  Allergen Reactions  . Cefepime     seizure  . Diovan [Valsartan] Cough  . Gabapentin Other (See Comments)    Sedation at all doses  . Hydralazine Itching  . Lisinopril Cough  . Pregabalin Other (See Comments)    Sedation at all doses  . Levofloxacin Other (See Comments)    Too many side effects. Nausea, vomiting, upset stomach, increased confusion, etc Other reaction(s): Confusion Nausea, chills Nausea, chills   . Sulfamethoxazole-Trimethoprim Other (See Comments)    Too many side effects. Nausea, vomiting, upset stomach, increased confusion, etc    Antimicrobials this admission: Ceftaz 3/15 >>   Microbiology results: 3/15 BCx: pending 3/15 UCx: pending    Thank you for allowing pharmacy to be a part of this patient's care.  Cledith Abdou A Jaiyanna Safran 09/24/2019 10:02 PM

## 2019-09-24 NOTE — ED Notes (Signed)
Attempted to call report to the nurse on 1A, was told she will call right back to get report.

## 2019-09-24 NOTE — H&P (Signed)
History and Physical    Blake [name] FVC:[id number] DOB: May 10, 1923 DOA: 09/24/2019  PCP: Adin Hector, MD  Patient coming from: Home, lives with wife and daughter. Also has home health aids.   I have personally briefly reviewed patient's old medical records in [place]  Chief Complaint: altered mental status   HPI: Blake [name] is a 86 y.o. male with medical history significant for prostate cancer s/p radiation therapy with chronic hydronephrosis with bilateral nephrostomy tube, sick sinus syndrome s/p pacemaker, CKD stage 4, OSA not on CPAP, history of seizure from cefepime, AAA, atrial fibrillation not on anticoagulation, history of SVT, hypertension and history of GI bleed who presents with concerns of altered mental status.  Son at bedside provides most of the history as patient is a poor historian. For the past week patient has been more confused and fatigue to the point where today he could no longer get out of bed.  He also had progressive right leg swelling and a new cough.  He is also having several episodes of diarrhea a day with incontinence which is new for him.  He recently had both his nephrostomy tubes replaced a week ago after one fell out.  There was initially some hematuria after the procedure but this has since cleared up.  He was treated prophylactically with Cipro.Denies any fever.    ED Course: He was afebrile, normotensive on room air.  CBC shows no leukocytosis, and anemia of 9.6 from a prior of 7.3 and appears to possibly be heme-concentrated.  Platelets of 93 with a decrease from 124 eight months ago. Na of 134, glucose of 156, creatinine stable at 4.15. BNP of 982. Lactic of 1.7. UA was positive for large leukocytes and negative nitrates and >50 WBC.   CT abdomen and pelvis showed bilateral percutaneous nephrostomy tubes in place without significant hydronephrosis.  There are 3 small stones within the distal left ureter measuring 2 to 3 mm.  There is  moderate right pleural effusion with overlying atelectasis.  There is also stable infra renal abdominal aortic aneurysm measuring 5.3 cm and will need follow-up in 3 to 6 months.  Negative right LE ultrasound for DVT.   Review of Systems:  Unable to fully obtain due to AMS   Past Medical History:  Diagnosis Date  . AAA (abdominal aortic aneurysm) ([place])   . Arrhythmia   . CHF (congestive heart failure) ([place])   . Diabetes mellitus without complication ([place])   . GI bleed   . Heart murmur   . Hyperlipidemia   . Hypertension   . Pacemaker   . Prostate cancer [facility])     Past Surgical History:  Procedure Laterality Date  . FLEXIBLE SIGMOIDOSCOPY N/A 10/21/2016   Procedure: FLEXIBLE SIGMOIDOSCOPY;  Surgeon: [name], MD;  Location: [facility] ENDOSCOPY;  Service: Endoscopy;  Laterality: N/A;  . IR NEPHROSTOGRAM LEFT THRU EXISTING ACCESS  01/16/2019  . IR NEPHROSTOMY EXCHANGE LEFT  02/06/2019  . IR NEPHROSTOMY EXCHANGE LEFT  04/12/2019  . IR NEPHROSTOMY EXCHANGE LEFT  06/01/2019  . IR NEPHROSTOMY EXCHANGE LEFT  07/20/2019  . IR NEPHROSTOMY EXCHANGE LEFT  09/19/2019  . IR NEPHROSTOMY EXCHANGE RIGHT  02/06/2019  . IR NEPHROSTOMY EXCHANGE RIGHT  04/12/2019  . IR NEPHROSTOMY EXCHANGE RIGHT  06/01/2019  . IR NEPHROSTOMY EXCHANGE RIGHT  07/20/2019  . IR NEPHROSTOMY EXCHANGE RIGHT  09/19/2019  . IR NEPHROSTOMY PLACEMENT RIGHT  01/01/2019  . JOINT REPLACEMENT    . NEPHROSTOMY Bilateral   .  PACEMAKER INSERTION Left 04/14/2015   Procedure: INSERTION PACEMAKER;  Surgeon: Isaias Cowman, MD;  Location: [facility] ORS;  Service: Cardiovascular;  Laterality: Left;  . PROSTATE SURGERY    . VISCERAL ARTERY INTERVENTION N/A 10/22/2016   Procedure: Visceral Artery Intervention;  Surgeon: Algernon Huxley, MD;  Location: [facility];  Service: Cardiovascular;  Laterality: N/A;     reports that he has quit smoking. He has never used smokeless tobacco. He reports that he does not drink alcohol or use drugs.   Allergies  Allergen Reactions  . Cefepime     seizure  . Diovan [Valsartan] Cough  . Gabapentin Other (See Comments)    Sedation at all doses  . Hydralazine Itching  . Lisinopril Cough  . Pregabalin Other (See Comments)    Sedation at all doses  . Levofloxacin Other (See Comments)    Too many side effects. Nausea, vomiting, upset stomach, increased confusion, etc Other reaction(s): Confusion Nausea, chills Nausea, chills   . Sulfamethoxazole-Trimethoprim Other (See Comments)    Too many side effects. Nausea, vomiting, upset stomach, increased confusion, etc    Family History  Problem Relation Age of Onset  . Hypertension Other   . Stroke Other   . Heart attack Other   . Stroke Sister   . Heart attack Brother      Prior to Admission medications   Medication Sig Start Date End Date Taking? Authorizing Provider  azelastine (ASTELIN) 0.1 % nasal spray Place 1 spray into both nostrils 2 (two) times daily as needed for rhinitis or allergies.   Yes [provider]  ciprofloxacin (CIPRO) 250 MG tablet Take 250 mg by mouth 2 (two) times daily.  09/19/19 09/29/19 Yes [provider]  feeding supplement, GLUCERNA SHAKE, (GLUCERNA SHAKE) LIQD Take 237 mLs by mouth 2 (two) times daily between meals. 01/01/19  Yes Fritzi Mandes, MD  finasteride (PROSCAR) 5 MG tablet Take 5 mg by mouth daily.   Yes [provider]  Iron-Vitamin C (VITRON-C PO) Take 1 tablet by mouth daily.   Yes [provider]  metoprolol tartrate 37.5 MG TABS Take 37.5 mg by mouth 2 (two) times daily. 09/13/17  Yes Gladstone Lighter, MD  Multiple Vitamins-Minerals (PRESERVISION/LUTEIN PO) Take 1 capsule by mouth 2 (two) times daily.   Yes [provider]  multivitamin-iron-minerals-folic acid (CENTRUM) chewable tablet Chew 2 tablets by mouth daily.   Yes [provider]  pantoprazole (PROTONIX) 40 MG tablet Take 40 mg by mouth daily.   Yes [provider]   rosuvastatin (CRESTOR) 5 MG tablet Take 5 mg by mouth at bedtime.   Yes [provider]  sitaGLIPtin (JANUVIA) 50 MG tablet Take 0.5 tablets (25 mg total) by mouth daily. 01/01/19  Yes Fritzi Mandes, MD  sodium bicarbonate 650 MG tablet Take 1 tablet (650 mg total) by mouth 2 (two) times daily. 09/13/17  Yes Gladstone Lighter, MD  tamsulosin (FLOMAX) 0.4 MG CAPS capsule Take 0.4 mg by mouth daily.    Yes [provider]  torsemide (DEMADEX) 20 MG tablet Take 40 mg by mouth 2 (two) times daily.  03/29/19 03/28/20 Yes [provider]    Physical Exam: Vitals:   09/24/19 1700 09/24/19 1730 09/24/19 1900 09/24/19 2030  BP: (!) 151/98 (!) 142/96 (!) 147/93 (!) 142/92  Pulse: (!) 59 83 81 75  Resp: 16 18 (!) 21 18  Temp:      TempSrc:      SpO2: 96% 100% 99% 98%  Weight:      Height:        Constitutional: NAD, calm, comfortable, elderly gentleman sitting upright asleep but awoke easily to voice.  Vitals:   09/24/19 1700 09/24/19 1730 09/24/19 1900 09/24/19 2030  BP: (!) 151/98 (!) 142/96 (!) 147/93 (!) 142/92  Pulse: (!) 59 83 81 75  Resp: 16 18 (!) 21 18  Temp:      TempSrc:      SpO2: 96% 100% 99% 98%  Weight:      Height:       Eyes: PERRL, lids and conjunctivae normal ENMT: Mucous membranes are moist.  Neck: normal, supple Respiratory: clear to auscultation bilaterally, no wheezing, no crackles. Normal respiratory effort on room air. No accessory muscle use.  Cardiovascular: irregularly irregular rhythm,  no murmurs / rubs / gallops. +3 pitting edema of the right lower extremity up to the knee.  Abdomen: no tenderness, no masses palpated. Bowel sounds positive.  Back: bilateral nephrostomy tube in place with light colored yellow urine in bag Musculoskeletal: no clubbing / cyanosis. No joint deformity upper and lower extremities. no contractures. Normal muscle tone.  Skin: stage 1 ulcer of the right heel and stage 2 ulcer at the left buttock near gluteal  cleft Neurologic: CN 2-12 grossly intact. Sensation intact. Strength 5/5 in all 4.  Psychiatric: Alert and oriented to self and place but otherwise appears drowsy.    Labs on Admission: I have personally reviewed following labs and imaging studies  CBC: Recent Labs  Lab 09/24/19 1530  WBC 6.1  NEUTROABS 4.9  HGB 9.6*  HCT 31.2*  MCV 86.7  PLT 93*   Basic Metabolic Panel: Recent Labs  Lab 09/24/19 1530  NA 134*  K 3.3*  CL 93*  CO2 27  GLUCOSE 156*  BUN 93*  CREATININE 4.15*  CALCIUM 7.9*   GFR: Estimated Creatinine Clearance: 12.7 mL/min (A) (by C-G formula based on SCr of 4.15 mg/dL (H)). Liver Function Tests: Recent Labs  Lab 09/24/19 1530  AST 18  ALT 17  ALKPHOS 86  BILITOT 0.8  PROT 7.1  ALBUMIN 2.7*   No results for input(s): LIPASE, AMYLASE in the last 168 hours. No results for input(s): AMMONIA in the last 168 hours. Coagulation Profile: No results for input(s): INR, PROTIME in the last 168 hours. Cardiac Enzymes: No results for input(s): CKTOTAL, CKMB, CKMBINDEX, TROPONINI in the last 168 hours. BNP (last 3 results) No results for input(s): PROBNP in the last 8760 hours. HbA1C: No results for input(s): HGBA1C in the last 72 hours. CBG: No results for input(s): GLUCAP in the last 168 hours. Lipid Profile: No results for input(s): CHOL, HDL, LDLCALC, TRIG, CHOLHDL, LDLDIRECT in the last 72 hours. Thyroid Function Tests: No results for input(s): TSH, T4TOTAL, FREET4, T3FREE, THYROIDAB in the last 72 hours. Anemia Panel: No results for input(s): VITAMINB12, FOLATE, FERRITIN, TIBC, IRON, RETICCTPCT in the last 72 hours. Urine analysis:    Component Value Date/Time   COLORURINE YELLOW (A) 09/24/2019 1530   APPEARANCEUR HAZY (A) 09/24/2019 1530   APPEARANCEUR Cloudy (A) 03/05/2019 1629   LABSPEC 1.011 09/24/2019 1530   LABSPEC 1.029 12/29/2012 1554   PHURINE 6.0 09/24/2019 1530   GLUCOSEU NEGATIVE 09/24/2019 1530   GLUCOSEU see comment  12/29/2012 1554   HGBUR MODERATE (A) 09/24/2019 1530   BILIRUBINUR NEGATIVE 09/24/2019 1530   BILIRUBINUR Negative 03/05/2019 1629   BILIRUBINUR see comment 12/29/2012 [place] 09/24/2019 1530   PROTEINUR 30 (A) 09/24/2019  [place] 09/24/2019 1530   [employer] (A) 09/24/2019 1530   LEUKOCYTESUR see comment 12/29/2012 1554    Radiological Exams on Admission: CT ABDOMEN PELVIS WO CONTRAST  Result Date: 09/24/2019 CLINICAL DATA:  Hydronephrosis. Bilateral nephrostomy tubes in place. Patient presents with nausea, diarrhea and encephalopathy. EXAM: CT ABDOMEN AND PELVIS WITHOUT CONTRAST TECHNIQUE: Multidetector CT imaging of the abdomen and pelvis was performed following the standard protocol without IV contrast. COMPARISON:  08/31/2019 FINDINGS: Lower chest: Moderate right pleural effusion with overlying platelike atelectasis noted. Mild platelike atelectasis is also identified in the left lower lobe. Hepatobiliary: 8 mm low-attenuation structure in right lobe of liver is too small to characterize but appears unchanged from previous exam. The gallbladder is unremarkable. No biliary dilatation. Pancreas: Unremarkable. No pancreatic ductal dilatation or surrounding inflammatory changes. Spleen: Normal in size without focal abnormality. Adrenals/Urinary Tract: The adrenal glands appear normal. Asymmetric right renal atrophy. Bilateral percutaneous nephrostomy tubes are in place. No significant hydronephrosis identified bilaterally. There are approximately 3 small stones within the distal left ureter which measure up to 2-3 mm. No right ureteral calculi. Urinary bladder is unremarkable. Stomach/Bowel: The stomach is nondistended. The small bowel loops have a normal caliber. Multiple colonic diverticula identified. No bowel wall thickening, inflammation or distension. Vascular/Lymphatic: Aortic atherosclerosis. Infrarenal abdominal aortic aneurysm measures 5.3 cm, image  46/2. Unchanged. No abdominopelvic adenopathy Reproductive: No mass identified. Other: No ascites or focal fluid collections. Fat containing umbilical hernia, unchanged. Musculoskeletal: Diffuse osteopenia. Stable degenerative changes throughout the visualized thoracolumbar spine. IMPRESSION: 1. Bilateral percutaneous nephrostomy tubes are in place without significant hydronephrosis. 2. There are 3 small stones within the distal left ureter measuring up to 2-3 mm. 3. Moderate right pleural effusion with overlying platelike atelectasis. 4. Stable infrarenal abdominal aortic aneurysm measuring 5.3 cm. Recommend followup by abdomen and pelvis CTA in 3-6 months, and vascular surgery referral/consultation if not already obtained. This recommendation follows ACR consensus guidelines: White Paper of the ACR Incidental Findings Committee II on Vascular Findings. J Am Coll Radiol 2013; 10:789-794. Aortic aneurysm NOS (ICD10-I71.9). Aortic Atherosclerosis (ICD10-I70.0). Electronically Signed   By: Kerby Moors M.D.   On: 09/24/2019 17:56   US Venous Img Lower Unilateral Right  Result Date: 09/24/2019 CLINICAL DATA:  Right lower extremity swelling for weeks. EXAM: RIGHT LOWER EXTREMITY VENOUS DOPPLER ULTRASOUND TECHNIQUE: Gray-scale sonography with compression, as well as color and duplex ultrasound, were performed to evaluate the deep venous system(s) from the level of the common femoral vein through the popliteal and proximal calf veins. COMPARISON:  None. FINDINGS: VENOUS Normal compressibility of the common femoral, superficial femoral, and popliteal veins, as well as the visualized calf veins. Visualized portions of profunda femoral vein and great saphenous vein unremarkable. No filling defects to suggest DVT on grayscale or color Doppler imaging. Doppler waveforms show normal direction of venous flow, normal respiratory phasicity and response to augmentation. Limited views of the contralateral common femoral vein  are unremarkable. OTHER Soft tissue edema noted in the calf. Limitations: none IMPRESSION: 1. No evidence of right lower extremity DVT. 2. Mild soft tissue edema in the calf. Electronically Signed   By: Keith Rake M.D.   On: 09/24/2019 18:36   DG Chest Portable 1 View  Result Date: 09/24/2019 CLINICAL DATA:  Weakness. EXAM: PORTABLE CHEST 1 VIEW COMPARISON:  January 08, 2019. FINDINGS: Stable cardiomegaly. Atherosclerosis of thoracic aorta is noted. Left-sided pacemaker is unchanged in position. No pneumothorax is noted. Left lung is clear. Mild right  basilar atelectasis or infiltrate is noted with possible small right pleural effusion. Bony thorax is unremarkable. IMPRESSION: Mild right basilar atelectasis or infiltrate is noted with small right pleural effusion. Aortic Atherosclerosis (ICD10-I70.0). Electronically Signed   By: Marijo Conception M.D.   On: 09/24/2019 15:04   DG Knee Right Port  Result Date: 09/24/2019 CLINICAL DATA:  Right knee pain. Swelling. EXAM: PORTABLE RIGHT KNEE - 1-2 VIEW COMPARISON:  None. FINDINGS: Tricompartmental peripheral spurring. Narrowing of the medial tibiofemoral joint space. No erosion, periosteal reaction, or bony destruction. Trace knee joint effusion. Mild generalized edema. There are vascular calcifications. IMPRESSION: 1. Tricompartmental osteoarthritis.  No acute osseous abnormality. 2. Trace knee joint effusion with generalized soft tissue edema. Electronically Signed   By: Keith Rake M.D.   On: 09/24/2019 18:20    EKG: Independently reviewed.   Assessment/Plan  Acute metabolic encephalopathy likely secondary to UTI in the setting of chronic hydronephrosis with bilateral nephrostomy tubes He has hx of pseudomonas in urine culture but has hx of seizure with Cefepime Continue Ceftazidine  Urine culture pending Speech evaluation before diet  Chronic hydronephrosis with bilateral nephrostomy tube continue Proscar and Flomax   Thrombocytopenia  plt of 93 from a prior of 124 eight months ago no signs of active bleeding. Monitor.   Stage 2 Pressure ulcer wound on left buttock/stage 1 right heel ulcer Wound care per RN  Abdominal aortic aneurysm Stable at 5.3 cm.  Will need CTA abdomen pelvis follow-up in 3 to 6 months outpatient  Chronic anemia stable   Paroxysmal atrial fibrillation not on anticoagulation continue metoprolol  CKD stage 4 stable. Avoid nephrotoxic agent continue sodium bicarbonate   Hypertension continue metoprolol and torsemide  OSA not on CPAP  DVT prophylaxis: SCDs Code Status: Full Family Communication: Plan discussed with son at bedside  disposition Plan: Home with at least 2 midnight stays  Consults called:  Admission status: inpatient  Lyn Joens T Rosella Crandell DO [facility]   If 7PM-7AM, please contact night-coverage [email or web address]   09/24/2019, 9:43 PM

## 2019-09-24 NOTE — ED Triage Notes (Signed)
Pt arrival via [facility] from home due to generalized weakness. Pt family called out because the patient has been weaker lately and had some swelling in his right knee. Pt also had a cough for the last few weeks. Pt has nephrostomy tubes on assessment and a hx of UTI's. Pt hasn't been eating as much lately.  Pt was afebrile with EMS at 98.5. Pt BP 140/100, CBG 202. Pt a&ox4.  Dr. Juliet Rude at bedside.

## 2019-09-24 NOTE — ED Notes (Signed)
Report given to Rebekah, RN 

## 2019-09-24 NOTE — ED Provider Notes (Signed)
[facility] Emergency Department Provider Note  ____________________________________________   First MD Initiated Contact with Patient 09/24/19 1437     (approximate)  I have reviewed the triage vital signs and the nursing notes.   HISTORY  Chief Complaint Weakness and Cough    HPI Blake [name] is a 86 y.o. male  With h/o CHF, DM, HTN, HLD, here with weakness. Pt reports that over the past 1 week or so, he's had progressively worsening weakness. He states he has just felt more and more tired, fatigued, and weak. He has had increased cough and poor appetite. No vomiting or diarrhea. No chest ain. He reports that he has also felt some mild pain in his b/l sides at the site of his nephrostomy tubes. He does not know or recall if they have changed in output or urine color/smell. No current pain. No specific alleviating factors or meds tried.        Past Medical History:  Diagnosis Date  . AAA (abdominal aortic aneurysm) ([place])   . Arrhythmia   . CHF (congestive heart failure) ([place])   . Diabetes mellitus without complication ([place])   . GI bleed   . Heart murmur   . Hyperlipidemia   . Hypertension   . Pacemaker   . Prostate cancer [facility])     Patient Active Problem List   Diagnosis Date Noted  . Seizure ([place]) 01/03/2019  . Acute CHF (congestive heart failure) ([place]) 12/25/2018  . Acute renal failure (ARF) ([place]) 11/21/2018  . AKI (acute kidney injury) ([place])   . Goals of care, counseling/discussion   . Palliative care by specialist   . DNR (do not resuscitate) discussion   . Seizure-like activity ([place]) 11/05/2018  . Allergic rhinitis 07/24/2018  . Cardiac pacemaker in situ 07/24/2018  . History of sleep apnea 07/24/2018  . Macular degeneration 07/24/2018  . Trigeminal neuralgia pain 01/11/2018  . TMJ pain dysfunction syndrome 01/11/2018  . Recurrent UTI 09/26/2017  . 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 Pressure injury of skin  09/12/2017  . SIRS (systemic inflammatory response syndrome) ([place]) 09/10/2017  . Chronic cystitis 08/25/2017  . Sepsis ([place]) 06/10/2017  . CAP (community acquired pneumonia) 06/10/2017  . Infection due to enterococcus 06/07/2017  . UTI (urinary tract infection) 05/17/2017  . Blood in stool   . Hemorrhage of rectum and anus   . Lower GI bleed 10/20/2016  . Near syncope   . Acute renal failure superimposed on chronic kidney disease ([place])   . Pneumonia 10/09/2016  . Aspiration pneumonia ([place]) 10/09/2016  . Acute respiratory failure ([place]) 10/09/2016  . Hydronephrosis, bilateral 07/19/2016  . Spinal stenosis of lumbar region with neurogenic claudication 07/19/2016  . AAA (abdominal aortic aneurysm) without rupture ([place]) 04/27/2016  . Arthritis, degenerative 03/31/2016  . A-fib ([place]) 03/31/2016  . HLD (hyperlipidemia) 03/31/2016  . CA of prostate ([place]) 03/31/2016  . Central perforation of tympanic membrane of right ear 03/26/2016  . 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 Presbycusis of left ear with restricted hearing of right ear 03/01/2016  . Chronic kidney disease, stage 3 (moderate) 10/30/2015  . Episode of unresponsiveness 10/30/2015  . Sick sinus syndrome ([place]) 10/30/2015  . Thrombocytopenia, unspecified ([place]) 07/24/2015  . Fitting or adjustment of cardiac pacemaker 05/09/2015  . SVT (supraventricular tachycardia) ([place]) 04/09/2015  . Diabetes mellitus, type 2 ([place]) 04/09/2015  . Benign hypertension 04/09/2015  . Acute myocardial  infarction, initial episode of care ([place]) 04/09/2015  . Phimosis 01/30/2014  . Bladder outlet obstruction 04/18/2013  . Dysuria 04/18/2013  . Gross hematuria 04/18/2013  . Incomplete emptying of bladder 11/26/2012  . Personal history of prostate cancer 11/26/2012    Past Surgical History:  Procedure Laterality Date  . FLEXIBLE SIGMOIDOSCOPY N/A 10/21/2016   Procedure: FLEXIBLE  SIGMOIDOSCOPY;  Surgeon: [name], MD;  Location: [facility] ENDOSCOPY;  Service: Endoscopy;  Laterality: N/A;  . IR NEPHROSTOGRAM LEFT THRU EXISTING ACCESS  01/16/2019  . IR NEPHROSTOMY EXCHANGE LEFT  02/06/2019  . IR NEPHROSTOMY EXCHANGE LEFT  04/12/2019  . IR NEPHROSTOMY EXCHANGE LEFT  06/01/2019  . IR NEPHROSTOMY EXCHANGE LEFT  07/20/2019  . IR NEPHROSTOMY EXCHANGE LEFT  09/19/2019  . IR NEPHROSTOMY EXCHANGE RIGHT  02/06/2019  . IR NEPHROSTOMY EXCHANGE RIGHT  04/12/2019  . IR NEPHROSTOMY EXCHANGE RIGHT  06/01/2019  . IR NEPHROSTOMY EXCHANGE RIGHT  07/20/2019  . IR NEPHROSTOMY EXCHANGE RIGHT  09/19/2019  . IR NEPHROSTOMY PLACEMENT RIGHT  01/01/2019  . JOINT REPLACEMENT    . NEPHROSTOMY Bilateral   . PACEMAKER INSERTION Left 04/14/2015   Procedure: INSERTION PACEMAKER;  Surgeon: Isaias Cowman, MD;  Location: [facility] ORS;  Service: Cardiovascular;  Laterality: Left;  . PROSTATE SURGERY    . VISCERAL ARTERY INTERVENTION N/A 10/22/2016   Procedure: Visceral Artery Intervention;  Surgeon: Algernon Huxley, MD;  Location: [facility] LAB;  Service: Cardiovascular;  Laterality: N/A;    Prior to Admission medications   Medication Sig Start Date End Date Taking? Authorizing Provider  azelastine (ASTELIN) 0.1 % nasal spray Place 1 spray into both nostrils 2 (two) times daily as needed for rhinitis or allergies.   Yes [provider]  ciprofloxacin (CIPRO) 250 MG tablet Take 250 mg by mouth 2 (two) times daily.  09/19/19 09/29/19 Yes [provider]  feeding supplement, GLUCERNA SHAKE, (GLUCERNA SHAKE) LIQD Take 237 mLs by mouth 2 (two) times daily between meals. 01/01/19  Yes Fritzi Mandes, MD  finasteride (PROSCAR) 5 MG tablet Take 5 mg by mouth daily.   Yes [provider]  Iron-Vitamin C (VITRON-C PO) Take 1 tablet by mouth daily.   Yes [provider]  metoprolol tartrate 37.5 MG TABS Take 37.5 mg by mouth 2 (two) times daily. 09/13/17  Yes Gladstone Lighter, MD  Multiple  Vitamins-Minerals (PRESERVISION/LUTEIN PO) Take 1 capsule by mouth 2 (two) times daily.   Yes [provider]  multivitamin-iron-minerals-folic acid (CENTRUM) chewable tablet Chew 2 tablets by mouth daily.   Yes [provider]  pantoprazole (PROTONIX) 40 MG tablet Take 40 mg by mouth daily.   Yes [provider]  rosuvastatin (CRESTOR) 5 MG tablet Take 5 mg by mouth at bedtime.   Yes [provider]  sitaGLIPtin (JANUVIA) 50 MG tablet Take 0.5 tablets (25 mg total) by mouth daily. 01/01/19  Yes Fritzi Mandes, MD  sodium bicarbonate 650 MG tablet Take 1 tablet (650 mg total) by mouth 2 (two) times daily. 09/13/17  Yes Gladstone Lighter, MD  tamsulosin (FLOMAX) 0.4 MG CAPS capsule Take 0.4 mg by mouth daily.    Yes [provider]  torsemide (DEMADEX) 20 MG tablet Take 40 mg by mouth 2 (two) times daily.  03/29/19 03/28/20 Yes [provider]    Allergies Cefepime, Diovan [valsartan], Gabapentin, Hydralazine, Lisinopril, Pregabalin, Levofloxacin, and Sulfamethoxazole-trimethoprim  Family History  Problem Relation Age of Onset  . Hypertension Other   . Stroke Other   . Heart attack  Other   . Stroke Sister   . Heart attack Brother     Social History Social History   Tobacco Use  . Smoking status: Former Research scientist (life sciences)  . Smokeless tobacco: Never Used  Substance Use Topics  . Alcohol use: No  . Drug use: No    Review of Systems  Review of Systems  Constitutional: Positive for appetite change and fatigue. Negative for chills and fever.  HENT: Negative for sore throat.   Respiratory: Positive for cough. Negative for shortness of breath.   Cardiovascular: Negative for chest pain.  Gastrointestinal: Negative for abdominal pain.  Genitourinary: Negative for flank pain.  Musculoskeletal: Negative for neck pain.  Skin: Negative for rash and wound.  Allergic/Immunologic: Negative for immunocompromised state.  Neurological: Positive for weakness.  Negative for numbness.  Hematological: Does not bruise/bleed easily.  All other systems reviewed and are negative.    ____________________________________________  PHYSICAL EXAM:      VITAL SIGNS: ED Triage Vitals [09/24/19 1437]  Enc Vitals Group     BP      Pulse      Resp      Temp      Temp src      SpO2      Weight 215 lb (97.5 kg)     Height 6\' 2"  (1.88 m)     Head Circumference      Peak Flow      Pain Score 8     Pain Loc      Pain Edu?      Excl. in [place]?      Physical Exam Vitals and nursing note reviewed.  Constitutional:      General: He is not in acute distress.    Appearance: He is well-developed.  HENT:     Head: Normocephalic and atraumatic.  Eyes:     Conjunctiva/sclera: Conjunctivae normal.  Cardiovascular:     Rate and Rhythm: Normal rate and regular rhythm.     Heart sounds: Murmur present.  Pulmonary:     Effort: Pulmonary effort is normal. Tachypnea present. No respiratory distress.     Breath sounds: Examination of the right-lower field reveals rhonchi. Examination of the left-lower field reveals rhonchi. Rhonchi present. No wheezing.  Abdominal:     General: Abdomen is flat. There is no distension.  Musculoskeletal:     Cervical back: Neck supple.     Right lower leg: Edema (2+) present.     Left lower leg: Edema (1+) present.  Skin:    General: Skin is warm.     Capillary Refill: Capillary refill takes less than 2 seconds.     Findings: No rash.  Neurological:     Mental Status: He is alert and oriented to person, place, and time.     Motor: No abnormal muscle tone.       ____________________________________________   LABS (all labs ordered are listed, but only abnormal results are displayed)  Labs Reviewed  CBC WITH DIFFERENTIAL/PLATELET - Abnormal; Notable for the following components:      Result Value   RBC 3.60 (*)    Hemoglobin 9.6 (*)    HCT 31.2 (*)    RDW 17.7 (*)    Platelets 93 (*)    Lymphs Abs 0.5 (*)     All other components within normal limits  COMPREHENSIVE METABOLIC PANEL - Abnormal; Notable for the following components:   Sodium 134 (*)    Potassium 3.3 (*)    Chloride 93 (*)  Glucose, Bld 156 (*)    BUN 93 (*)    Creatinine, Ser 4.15 (*)    Calcium 7.9 (*)    Albumin 2.7 (*)    GFR calc non Af Amer 11 (*)    GFR calc Af Amer 13 (*)    All other components within normal limits  URINALYSIS, COMPLETE (UACMP) WITH MICROSCOPIC - Abnormal; Notable for the following components:   Color, Urine YELLOW (*)    APPearance HAZY (*)    Hgb urine dipstick MODERATE (*)    Protein, ur 30 (*)    Leukocytes,Ua LARGE (*)    WBC, UA >50 (*)    Bacteria, UA RARE (*)    All other components within normal limits  BRAIN NATRIURETIC PEPTIDE - Abnormal; Notable for the following components:   B Natriuretic Peptide 982.0 (*)    All other components within normal limits  CULTURE, BLOOD (ROUTINE X 2)  CULTURE, BLOOD (ROUTINE X 2)  URINE CULTURE  C DIFFICILE QUICK SCREEN W PCR REFLEX  SARS CORONAVIRUS 2 (TAT 6-24 HRS)  LACTIC ACID, PLASMA  CBC WITH DIFFERENTIAL/PLATELET  LACTIC ACID, PLASMA    ____________________________________________  EKG: Atrial fibrillation, VR 84. QRS 147, QTc 541. RBBB and LAFB. Non-specific ST changes. No acute ST elevations. ________________________________________  RADIOLOGY All imaging, including plain films, CT scans, and ultrasounds, independently reviewed by me, and interpretations confirmed via formal radiology reads.  ED MD interpretation:   CXR: Right basilar infiltrate with consolidation  Official radiology report(s): CT ABDOMEN PELVIS WO CONTRAST  Result Date: 09/24/2019 CLINICAL DATA:  Hydronephrosis. Bilateral nephrostomy tubes in place. Patient presents with nausea, diarrhea and encephalopathy. EXAM: CT ABDOMEN AND PELVIS WITHOUT CONTRAST TECHNIQUE: Multidetector CT imaging of the abdomen and pelvis was performed following the standard protocol  without IV contrast. COMPARISON:  08/31/2019 FINDINGS: Lower chest: Moderate right pleural effusion with overlying platelike atelectasis noted. Mild platelike atelectasis is also identified in the left lower lobe. Hepatobiliary: 8 mm low-attenuation structure in right lobe of liver is too small to characterize but appears unchanged from previous exam. The gallbladder is unremarkable. No biliary dilatation. Pancreas: Unremarkable. No pancreatic ductal dilatation or surrounding inflammatory changes. Spleen: Normal in size without focal abnormality. Adrenals/Urinary Tract: The adrenal glands appear normal. Asymmetric right renal atrophy. Bilateral percutaneous nephrostomy tubes are in place. No significant hydronephrosis identified bilaterally. There are approximately 3 small stones within the distal left ureter which measure up to 2-3 mm. No right ureteral calculi. Urinary bladder is unremarkable. Stomach/Bowel: The stomach is nondistended. The small bowel loops have a normal caliber. Multiple colonic diverticula identified. No bowel wall thickening, inflammation or distension. Vascular/Lymphatic: Aortic atherosclerosis. Infrarenal abdominal aortic aneurysm measures 5.3 cm, image 46/2. Unchanged. No abdominopelvic adenopathy Reproductive: No mass identified. Other: No ascites or focal fluid collections. Fat containing umbilical hernia, unchanged. Musculoskeletal: Diffuse osteopenia. Stable degenerative changes throughout the visualized thoracolumbar spine. IMPRESSION: 1. Bilateral percutaneous nephrostomy tubes are in place without significant hydronephrosis. 2. There are 3 small stones within the distal left ureter measuring up to 2-3 mm. 3. Moderate right pleural effusion with overlying platelike atelectasis. 4. Stable infrarenal abdominal aortic aneurysm measuring 5.3 cm. Recommend followup by abdomen and pelvis CTA in 3-6 months, and vascular surgery referral/consultation if not already obtained. This  recommendation follows ACR consensus guidelines: White Paper of the ACR Incidental Findings Committee II on Vascular Findings. J Am Coll Radiol 2013; 10:789-794. Aortic aneurysm NOS (ICD10-I71.9). Aortic Atherosclerosis (ICD10-I70.0). Electronically Signed   By: Queen Slough.D.  On: 09/24/2019 17:56   US Venous Img Lower Unilateral Right  Result Date: 09/24/2019 CLINICAL DATA:  Right lower extremity swelling for weeks. EXAM: RIGHT LOWER EXTREMITY VENOUS DOPPLER ULTRASOUND TECHNIQUE: Gray-scale sonography with compression, as well as color and duplex ultrasound, were performed to evaluate the deep venous system(s) from the level of the common femoral vein through the popliteal and proximal calf veins. COMPARISON:  None. FINDINGS: VENOUS Normal compressibility of the common femoral, superficial femoral, and popliteal veins, as well as the visualized calf veins. Visualized portions of profunda femoral vein and great saphenous vein unremarkable. No filling defects to suggest DVT on grayscale or color Doppler imaging. Doppler waveforms show normal direction of venous flow, normal respiratory phasicity and response to augmentation. Limited views of the contralateral common femoral vein are unremarkable. OTHER Soft tissue edema noted in the calf. Limitations: none IMPRESSION: 1. No evidence of right lower extremity DVT. 2. Mild soft tissue edema in the calf. Electronically Signed   By: Keith Rake M.D.   On: 09/24/2019 18:36   DG Chest Portable 1 View  Result Date: 09/24/2019 CLINICAL DATA:  Weakness. EXAM: PORTABLE CHEST 1 VIEW COMPARISON:  January 08, 2019. FINDINGS: Stable cardiomegaly. Atherosclerosis of thoracic aorta is noted. Left-sided pacemaker is unchanged in position. No pneumothorax is noted. Left lung is clear. Mild right basilar atelectasis or infiltrate is noted with possible small right pleural effusion. Bony thorax is unremarkable. IMPRESSION: Mild right basilar atelectasis or infiltrate is  noted with small right pleural effusion. Aortic Atherosclerosis (ICD10-I70.0). Electronically Signed   By: Marijo Conception M.D.   On: 09/24/2019 15:04   DG Knee Right Port  Result Date: 09/24/2019 CLINICAL DATA:  Right knee pain. Swelling. EXAM: PORTABLE RIGHT KNEE - 1-2 VIEW COMPARISON:  None. FINDINGS: Tricompartmental peripheral spurring. Narrowing of the medial tibiofemoral joint space. No erosion, periosteal reaction, or bony destruction. Trace knee joint effusion. Mild generalized edema. There are vascular calcifications. IMPRESSION: 1. Tricompartmental osteoarthritis.  No acute osseous abnormality. 2. Trace knee joint effusion with generalized soft tissue edema. Electronically Signed   By: Keith Rake M.D.   On: 09/24/2019 18:20    ____________________________________________  PROCEDURES   Procedure(s) performed (including Critical Care):  Procedures  ____________________________________________  INITIAL IMPRESSION / MDM / [place] / ED COURSE  As part of my medical decision making, I reviewed the following data within the [place] notes reviewed and incorporated, Old chart reviewed, Notes from prior ED visits, and [place] Controlled Substance Database       *Blake [name] was evaluated in Emergency Department on 09/24/2019 for the symptoms described in the history of present illness. He was evaluated in the context of the global COVID-19 pandemic, which necessitated consideration that the patient might be at risk for infection with the SARS-CoV-2 virus that causes COVID-19. Institutional protocols and algorithms that pertain to the evaluation of patients at risk for COVID-19 are in a state of rapid change based on information released by regulatory bodies including the CDC and federal and state organizations. These policies and algorithms were followed during the patient's care in the ED.  Some ED evaluations and interventions may be delayed as  a result of limited staffing during the pandemic.*     Medical Decision Making:  86 yo M here with weakness, encephalopathy, RLE swelling. Labs, imaging as above. Primary suspicion is encephalopathy 2/2 UTI with atelectasis (causing his cough), RLE dependent edema. DVT study negative. CXR with possible RLL infiltrate,  though this could be atelectasis. CT A/P shows neph tubes in place. UA is c/w UTI. He's been on oral abx but has a h/o PSA. Will start on Ceftaz (has had reactoin to cefepime but has tolerated other cephalosporins), and admit. WOCN consult placed for stage III/non-stage able ulcers on R heel and sacrum. Family updated.  ____________________________________________  FINAL CLINICAL IMPRESSION(S) / ED DIAGNOSES  Final diagnoses:  Pressure injury of right heel, unstageable ([place])  Pressure injury of skin of sacral region, unspecified injury stage  Encephalopathy  Acute cystitis without hematuria     MEDICATIONS GIVEN DURING THIS VISIT:  Medications  cefTAZidime (FORTAZ) 2 g in sodium chloride 0.9 % 100 mL IVPB (2 g Intravenous New Bag/Given 09/24/19 1919)     ED Discharge Orders    None       Note:  This document was prepared using Dragon voice recognition software and may include unintentional dictation errors.   Duffy Bruce, MD 09/24/19 [phone]

## 2019-09-25 ENCOUNTER — Encounter: Payer: Self-pay | Admitting: Family Medicine

## 2019-09-25 DIAGNOSIS — N390 Urinary tract infection, site not specified: Secondary | ICD-10-CM

## 2019-09-25 DIAGNOSIS — L89302 Pressure ulcer of unspecified buttock, stage 2: Secondary | ICD-10-CM

## 2019-09-25 LAB — CBC
HCT: 32.1 % — ABNORMAL LOW (ref 39.0–52.0)
Hemoglobin: 10.2 g/dL — ABNORMAL LOW (ref 13.0–17.0)
MCH: 27.1 pg (ref 26.0–34.0)
MCHC: 31.8 g/dL (ref 30.0–36.0)
MCV: 85.4 fL (ref 80.0–100.0)
Platelets: 95 10*3/uL — ABNORMAL LOW (ref 150–400)
RBC: 3.76 MIL/uL — ABNORMAL LOW (ref 4.22–5.81)
RDW: 17.6 % — ABNORMAL HIGH (ref 11.5–15.5)
WBC: 5.3 10*3/uL (ref 4.0–10.5)
nRBC: 0 % (ref 0.0–0.2)

## 2019-09-25 LAB — BASIC METABOLIC PANEL
Anion gap: 11 (ref 5–15)
BUN: 88 mg/dL — ABNORMAL HIGH (ref 8–23)
CO2: 30 mmol/L (ref 22–32)
Calcium: 8.3 mg/dL — ABNORMAL LOW (ref 8.9–10.3)
Chloride: 99 mmol/L (ref 98–111)
Creatinine, Ser: 3.89 mg/dL — ABNORMAL HIGH (ref 0.61–1.24)
GFR calc Af Amer: 14 mL/min — ABNORMAL LOW (ref >60)
GFR calc non Af Amer: 12 mL/min — ABNORMAL LOW (ref >60)
Glucose, Bld: 171 mg/dL — ABNORMAL HIGH (ref 70–99)
Potassium: 3.5 mmol/L (ref 3.5–5.1)
Sodium: 140 mmol/L (ref 135–145)

## 2019-09-25 LAB — C DIFFICILE QUICK SCREEN W PCR REFLEX
C Diff antigen: NEGATIVE
C Diff interpretation: NOT DETECTED
C Diff toxin: NEGATIVE

## 2019-09-25 LAB — SARS CORONAVIRUS 2 (TAT 6-24 HRS): SARS Coronavirus 2: NEGATIVE

## 2019-09-25 MED ORDER — ACETAMINOPHEN 325 MG PO TABS
650.0000 mg | ORAL_TABLET | Freq: Four times a day (QID) | ORAL | Status: DC | PRN
  Administered 2019-09-25 – 2019-09-28 (×2): 650 mg via ORAL
  Filled 2019-09-25 (×2): qty 2

## 2019-09-25 MED ORDER — DICLOFENAC SODIUM 1 % EX GEL
2.0000 g | Freq: Four times a day (QID) | CUTANEOUS | Status: DC
  Administered 2019-09-25 – 2019-09-28 (×13): 2 g via TOPICAL
  Filled 2019-09-25: qty 100

## 2019-09-25 MED ORDER — DEXTROSE 5 % IV SOLN
2.0000 g | INTRAVENOUS | Status: DC
  Administered 2019-09-25: 2 g via INTRAVENOUS
  Filled 2019-09-25 (×2): qty 2

## 2019-09-25 MED ORDER — GLUCERNA SHAKE PO LIQD
237.0000 mL | Freq: Three times a day (TID) | ORAL | Status: DC
  Administered 2019-09-25 – 2019-09-28 (×8): 237 mL via ORAL

## 2019-09-25 MED ORDER — ADULT MULTIVITAMIN W/MINERALS CH
1.0000 | ORAL_TABLET | Freq: Every day | ORAL | Status: DC
  Administered 2019-09-26 – 2019-09-28 (×3): 1 via ORAL
  Filled 2019-09-25 (×3): qty 1

## 2019-09-25 MED ORDER — CHLORHEXIDINE GLUCONATE CLOTH 2 % EX PADS
6.0000 | MEDICATED_PAD | Freq: Every day | CUTANEOUS | Status: DC
  Administered 2019-09-25 – 2019-09-28 (×3): 6 via TOPICAL

## 2019-09-25 NOTE — Progress Notes (Signed)
[place] visited pt. per OR passed on from [place] for prayer.  pt. sitting up in bed but drowsy; son Dorwin Fitzhenry") in chair @ bedside; said pt. has been 'in and out'.  CH had extended conversation w/son; learned that pt. lives w/his wife (also 84yo) @ pt.'s sister's house.  Part-time caregivers offer help to family in caring for pt. and wife.  Son lives in [place].  Pt.'s wife suffers from anxiety and pt.'s hospitalization has been somewhat distressing to her. Ch attempted to address pt. but w/little success.  Pt. seems well supported by son.  No further needs expressed.    09/25/19 1500  Clinical Encounter Type  Visited With Patient and family together  Visit Type Initial;Psychological support;Social support  Referral From Nurse;Chaplain  Stress Factors  Family Stress Factors Health changes

## 2019-09-25 NOTE — Evaluation (Signed)
Physical Therapy Evaluation Patient Details Name: Blake [name] MRN: [id number] DOB: 1923-04-19 Today's Date: 09/25/2019   History of Present Illness  86 y.o. male with medical history significant for prostate cancer s/p radiation therapy with chronic hydronephrosis with bilateral nephrostomy tube, sick sinus syndrome s/p pacemaker, CKD stage 4, OSA not on CPAP, history of seizure from cefepime, AAA, atrial fibrillation not on anticoagulation, history of SVT, hypertension and history of GI bleed who presents with concerns of altered mental status.  Clinical Impression  Pt initially lethargic and minimally interactive, but with light cuing and encouragement he was actually able to participate relatively well with PT exam.  He showed functional, age-appropriate strength t/o U&LEs (R knee flexed secondary to OA) and though he needed some assist with mobility he did far more than he has over the last few days and son reports that he ambulation did not all that much different from his baseline and he (and the patient) feel okay about being able to go home with the assist they have in place.  Pt has been using an unwheeled walker and would benefit from a FWW and HHPT once medically ready for d/c.      Follow Up Recommendations Home health PT;Supervision/Assistance - 24 hour    Equipment Recommendations  Rolling walker with 5" wheels    Recommendations for Other Services       Precautions / Restrictions Precautions Precautions: Fall(enteric isolation) Restrictions Weight Bearing Restrictions: No      Mobility  Bed Mobility Overal bed mobility: Needs Assistance Bed Mobility: Supine to Sit;Sit to Supine     Supine to sit: Min assist Sit to supine: Max assist;Mod assist   General bed mobility comments: Pt was able to assist with getting to EOB much better than expected.  Offered hand-held-assist and with only light assist with the LEs he showed good ability to transition to sitting.  Heavy  assist to get LEs back into bed after standing/ambulation  Transfers Overall transfer level: Needs assistance Equipment used: Rolling walker (2 wheeled) Transfers: Sit to/from Stand Sit to Stand: Min assist         General transfer comment: elevated bed height ~3", cues for UE use and set up.  Pt did need assist to attain standing, but again impressed needing less assist than pt's son or this PT expected.  Ambulation/Gait Ambulation/Gait assistance: Min assist Gait Distance (Feet): 6 Feet Assistive device: Rolling walker (2 wheeled)       General Gait Details: Pt was able to take a few side step along EOB with relative ease, we then were able to do forward and backward ambulation with slow, cautious but safe ambulation.  b/l knees stayed flexed and he had mild limp on R but ultimately son states that (to his surprise) he did not seem to far from his baseline  Stairs            Wheelchair Mobility    Modified Rankin (Stroke Patients Only)       Balance Overall balance assessment: Needs assistance Sitting-balance support: Bilateral upper extremity supported Sitting balance-Leahy Scale: Good     Standing balance support: Bilateral upper extremity supported Standing balance-Leahy Scale: Fair Standing balance comment: reliant on walker, knees stayed bent but no LOBs or overt unsteadiness                             Pertinent Vitals/Pain Pain Assessment: Faces Faces Pain Scale: Hurts little more  Pain Location: R LE, most notedly in the knee    Home Living Family/patient expects to be discharged to:: Private residence Living Arrangements: Children Available Help at Discharge: Family;Available 24 hours/day;Personal care attendant Type of Home: House Home Access: Ramped entrance     Home Layout: Multi-level;Able to live on main level with bedroom/bathroom Home Equipment: Walker - 4 wheels;Cane - single point;Bedside commode;Shower seat - built  in;Wheelchair Insurance claims handler - standard      Prior Function Level of Independence: Needs assistance   Gait / Transfers Assistance Needed: limited household ambulator with standard walker, someone always with him  ADL's / Homemaking Assistance Needed: aide helps with dressing, bathing, etc        Hand Dominance        Extremity/Trunk Assessment   Upper Extremity Assessment Upper Extremity Assessment: Generalized weakness(age appropriate deficits)    Lower Extremity Assessment Lower Extremity Assessment: Generalized weakness(R grossly 3+/5 (knee lacks TKE) L grossly 4-/5)       Communication   Communication: No difficulties(minimally verbal, but able to communicate appropriately)  Cognition Arousal/Alertness: Lethargic Behavior During Therapy: WFL for tasks assessed/performed Overall Cognitive Status: History of cognitive impairments - at baseline(alert to self, does not know date, mod situational awareness)                                        General Comments General comments (skin integrity, edema, etc.): Pt keeping eyes closed most of the session but did surprisingly well with all aspects of mobility    Exercises     Assessment/Plan    PT Assessment Patient needs continued PT services  PT Problem List Decreased range of motion;Decreased strength;Decreased activity tolerance;Decreased mobility;Decreased balance;Decreased coordination;Decreased knowledge of use of DME;Decreased safety awareness;Decreased cognition       PT Treatment Interventions Gait training;Functional mobility training;Therapeutic activities;Therapeutic exercise;Balance training;Neuromuscular re-education;Patient/family education;Cognitive remediation;DME instruction    PT Goals (Current goals can be found in the Care Plan section)  Acute Rehab PT Goals Patient Stated Goal: go home PT Goal Formulation: With patient/family Time For Goal Achievement: 10/09/19 Potential to  Achieve Goals: Fair    Frequency Min 2X/week   Barriers to discharge        Co-evaluation               AM-PAC PT "6 Clicks" Mobility  Outcome Measure Help needed turning from your back to your side while in a flat bed without using bedrails?: A Little Help needed moving from lying on your back to sitting on the side of a flat bed without using bedrails?: A Little Help needed moving to and from a bed to a chair (including a wheelchair)?: A Little Help needed standing up from a chair using your arms (e.g., wheelchair or bedside chair)?: A Little Help needed to walk in hospital room?: A Lot Help needed climbing 3-5 steps with a railing? : A Lot 6 Click Score: 16    End of Session Equipment Utilized During Treatment: Gait belt Activity Tolerance: Patient tolerated treatment well Patient left: with bed alarm set;with call bell/phone within reach Nurse Communication: Mobility status(need for clean up) PT Visit Diagnosis: Muscle weakness (generalized) (M62.81);Difficulty in walking, not elsewhere classified (R26.2)    Time: 2947-6546 PT Time Calculation (min) (ACUTE ONLY): 39 min   Charges:   PT Evaluation $PT Eval Low Complexity: 1 Low PT Treatments $Therapeutic Activity: 8-22 mins  Kreg Shropshire, DPT 09/25/2019, 12:25 PM

## 2019-09-25 NOTE — Consult Note (Signed)
WOC Nurse Consult Note: Reason for Consult: pressure injury; sacrum and heel Wound type: 1. Stage 2 pressure injury; left inner gluteal fold 2. Deep tissue injury; right heel Pressure Injury POA: Yes Measurement: 1. Inner gluteal fold; 0.5cm x 0.5cm x 0.2cm  2. Right heel; 3cm x 3.5cm x 0cm  Wound bed: 1. Inner gluteal fold; 100% pink 2. Right heel; intact dark purple appears to have reabsorbtion of some blood and/or serous fluid Drainage (amount, consistency, odor)  1. Minimal, no odor.  2. none Periwound: intact, palpable pulses; right leg is extremely swollen in comparison to the left Family and patient aware Dressing procedure/placement/frequency: 1.silicone foam to the buttock wound; ok for family to use zinc based barrier cream at home or silicone foam dressing 2. Add Prevalon boots for offloading heels, demonstrated use of the boots to the patient's son who is CG at home 3. Add chair pressure redistribution pad for use if up in the chair and for use at home.   Discussed POC with patient and bedside nurse.  Re consult if needed, will not follow at this time. Thanks  Georgean Spainhower [employer], RN,CWOCN, CNS, [place] [phone])

## 2019-09-25 NOTE — Progress Notes (Signed)
PHARMACY - PHYSICIAN COMMUNICATION CRITICAL VALUE ALERT - BLOOD CULTURE IDENTIFICATION (BCID)  Blake [name] is an 86 y.o. male who presented to [facility] on 09/24/2019 with a chief complaint of AMS 2/2 UTI.   Assessment: 1 of 4 bottles (anaerobic) GPC. Afebrile and WBC WNL. Possibly a contaminant.   Name of physician (or Provider) Contacted: Dr. Jimmye Norman   Current antibiotics: Cefepime   Changes to prescribed antibiotics recommended:  Patient is on recommended antibiotics - No changes needed  Results for orders placed or performed during the hospital encounter of 12/25/18  Blood Culture ID Panel (Reflexed) (Collected: 12/25/2018  6:15 AM)  Result Value Ref Range   Enterococcus species NOT DETECTED NOT DETECTED   Listeria monocytogenes NOT DETECTED NOT DETECTED   Staphylococcus species NOT DETECTED NOT DETECTED   Staphylococcus aureus (BCID) NOT DETECTED NOT DETECTED   Streptococcus species NOT DETECTED NOT DETECTED   Streptococcus agalactiae NOT DETECTED NOT DETECTED   Streptococcus pneumoniae NOT DETECTED NOT DETECTED   Streptococcus pyogenes NOT DETECTED NOT DETECTED   Acinetobacter baumannii NOT DETECTED NOT DETECTED   Enterobacteriaceae species NOT DETECTED NOT DETECTED   Enterobacter cloacae complex NOT DETECTED NOT DETECTED   Escherichia coli NOT DETECTED NOT DETECTED   Klebsiella oxytoca NOT DETECTED NOT DETECTED   Klebsiella pneumoniae NOT DETECTED NOT DETECTED   Proteus species NOT DETECTED NOT DETECTED   Serratia marcescens NOT DETECTED NOT DETECTED   Carbapenem resistance NOT DETECTED NOT DETECTED   Haemophilus influenzae NOT DETECTED NOT DETECTED   Neisseria meningitidis NOT DETECTED NOT DETECTED   Pseudomonas aeruginosa DETECTED (A) NOT DETECTED   Candida albicans NOT DETECTED NOT DETECTED   Candida glabrata NOT DETECTED NOT DETECTED   Candida krusei NOT DETECTED NOT DETECTED   Candida parapsilosis NOT DETECTED NOT DETECTED   Candida tropicalis NOT DETECTED  NOT DETECTED    Rowland Lathe 09/25/2019  11:54 AM

## 2019-09-25 NOTE — Consult Note (Addendum)
Pharmacy Antibiotic Note  Blake [name] is a 86 y.o. male admitted on 09/24/2019 with UTI.  Patient has history of pseudomonas in blood previously. Has had a reaction to Cefepime in the past but has tolerated other cephalosporins. Recently had both nephrostomy tubes replaced a week ago after one dislodged. Patient not on scheduled dialysis outpatient. Pharmacy has been consulted for Ceftazidime dosing.  Patient has a history of pseudomonas UTI and currently has bilateral nephrostomy tubes.   Scr (mg/dl): 4.15 >3.89   Plan: Due to poor renal function and h/o pseudomonas UTI , will order Ceftazidime 2g IV Q24 hours.  May adjust dose depending on renal function improvement.  Height: 6\' 2"  (188 cm) Weight: 215 lb (97.5 kg) IBW/kg (Calculated) : 82.2  Temp (24hrs), Avg:98.1 F (36.7 C), Min:97.5 F (36.4 C), Max:99.2 F (37.3 C)  Recent Labs  Lab 09/24/19 1530 09/25/19 0448  WBC 6.1 5.3  CREATININE 4.15* 3.89*  LATICACIDVEN 1.7  --     Estimated Creatinine Clearance: 13.5 mL/min (A) (by C-G formula based on SCr of 3.89 mg/dL (H)).    Allergies  Allergen Reactions  . Cefepime     seizure  . Diovan [Valsartan] Cough  . Gabapentin Other (See Comments)    Sedation at all doses  . Hydralazine Itching  . Lisinopril Cough  . Pregabalin Other (See Comments)    Sedation at all doses  . Levofloxacin Other (See Comments)    Too many side effects. Nausea, vomiting, upset stomach, increased confusion, etc Other reaction(s): Confusion Nausea, chills Nausea, chills   . Sulfamethoxazole-Trimethoprim Other (See Comments)    Too many side effects. Nausea, vomiting, upset stomach, increased confusion, etc    Antimicrobials this admission: Ceftaz 3/15 >>   Microbiology results: 3/15 BCx: pending 3/15 UCx: pending    Thank you for allowing pharmacy to be a part of this patient's care.  Rowland Lathe 09/25/2019 9:11 AM

## 2019-09-25 NOTE — Plan of Care (Signed)
  Problem: Education: Goal: Knowledge of General Education information will improve Description: Including pain rating scale, medication(s)/side effects and non-pharmacologic comfort measures Outcome: Completed/Met

## 2019-09-25 NOTE — Progress Notes (Signed)
PROGRESS NOTE    Blake [name]  WCB:[id number] DOB: Mar 06, 1923 DOA: 09/24/2019 PCP: Adin Hector, MD       Assessment & Plan:   Principal Problem:   AMS (altered mental status) Active Problems:   Benign hypertension   A-fib (HCC)   AAA (abdominal aortic aneurysm) without rupture (HCC)   Thrombocytopenia, unspecified (HCC)   CKD (chronic kidney disease) stage 4, GFR 15-29 ml/min (HCC)   Anemia   OSA (obstructive sleep apnea)  Acute metabolic encephalopathy: likely secondary to UTI in the setting of chronic hydronephrosis with bilateral nephrostomy tubes. Hx of pseudomonas in urine culture. Continue Ceftazidine. Urine cx pending\  Unlikely bacteremia: 1 of 2 growing gram positive cocci in anaerobic bottle only. Likely a containment. Repeat blood cxs tomorrow.   Chronic hydronephrosis: with bilateral nephrostomy tube. Continue proscar and flomax   Thrombocytopenia: etiology unclear. No need for a transfusion at this time. Will continue to monitor   Stage 2 Pressure ulcer wound: on left buttock. Wound care consulted  Right heel ulcer: stage I. Wound care consulted   Abdominal aortic aneurysm: stable at 5.3 cm.  Will need CTA abdomen pelvis follow-up in 3 to 6 months outpatient  Chronic anemia: no need for a transfusion at this time. Will continue to monitor   PAF: not on anticoagulation. Continue metoprolol  CKDIV: stable. Continue sodium bicarbonate. Will continue to monitor   Hypertension: continue metoprolol and torsemide  OSA: not on CPAP  Generalized weakness: PT recs home health   DVT prophylaxis: SCDs Code Status: full Family Communication: discussed pt's care w/ pt's son who was at bedside and answered his questions and also discussed pt's care w/ pt's daughter, Karie Kirks [phone] Disposition Plan: will likely d/c home w/ home health which still needs to set up   Consultants:      Procedures:    Antimicrobials: ceftazidime     Subjective: Pt c/o fatigue  Objective: Vitals:   09/24/19 2200 09/24/19 2300 09/25/19 0042 09/25/19 0751  BP: (!) 146/91 (!) 153/88 (!) 142/88 (!) 142/90  Pulse: 77 71 76 72  Resp: 12 (!) 21 18   Temp:   (!) 97.5 F (36.4 C) 98 F (36.7 C)  TempSrc:   Oral Oral  SpO2: 96% 97% 97% 98%  Weight:      Height:        Intake/Output Summary (Last 24 hours) at 09/25/2019 0808 Last data filed at 09/25/2019 0145 Gross per 24 hour  Intake 100 ml  Output 650 ml  Net -550 ml   Filed Weights   09/24/19 1437  Weight: 97.5 kg    Examination:  General exam: Appears calm and comfortable  Respiratory system: Clear to auscultation. No rales, wheezes Cardiovascular system: S1 & S2 +. No rubs, gallops or clicks. RLE edeme Gastrointestinal system: Abdomen is nondistended, soft and nontender. Hyperactive bowel sounds heard. Central nervous system: Alert and awake. Moves all 4 extremities  Psychiatry: Flat mood and affect     Data Reviewed: I have personally reviewed following labs and imaging studies  CBC: Recent Labs  Lab 09/24/19 1530 09/25/19 0448  WBC 6.1 5.3  NEUTROABS 4.9  --   HGB 9.6* 10.2*  HCT 31.2* 32.1*  MCV 86.7 85.4  PLT 93* 95*   Basic Metabolic Panel: Recent Labs  Lab 09/24/19 1530 09/25/19 0448  NA 134* 140  K 3.3* 3.5  CL 93* 99  CO2 27 30  GLUCOSE 156* 171*  BUN 93* 88*  CREATININE 4.15*  3.89*  CALCIUM 7.9* 8.3*   GFR: Estimated Creatinine Clearance: 13.5 mL/min (A) (by C-G formula based on SCr of 3.89 mg/dL (H)). Liver Function Tests: Recent Labs  Lab 09/24/19 1530  AST 18  ALT 17  ALKPHOS 86  BILITOT 0.8  PROT 7.1  ALBUMIN 2.7*   No results for input(s): LIPASE, AMYLASE in the last 168 hours. No results for input(s): AMMONIA in the last 168 hours. Coagulation Profile: No results for input(s): INR, PROTIME in the last 168 hours. Cardiac Enzymes: No results for input(s): CKTOTAL, CKMB, CKMBINDEX, TROPONINI in the last 168  hours. BNP (last 3 results) No results for input(s): PROBNP in the last 8760 hours. HbA1C: No results for input(s): HGBA1C in the last 72 hours. CBG: No results for input(s): GLUCAP in the last 168 hours. Lipid Profile: No results for input(s): CHOL, HDL, LDLCALC, TRIG, CHOLHDL, LDLDIRECT in the last 72 hours. Thyroid Function Tests: No results for input(s): TSH, T4TOTAL, FREET4, T3FREE, THYROIDAB in the last 72 hours. Anemia Panel: No results for input(s): VITAMINB12, FOLATE, FERRITIN, TIBC, IRON, RETICCTPCT in the last 72 hours. Sepsis Labs: Recent Labs  Lab 09/24/19 1530  LATICACIDVEN 1.7    Recent Results (from the past 240 hour(s))  Blood culture (routine x 2)     Status: None (Preliminary result)   Collection Time: 09/24/19  3:16 PM   Specimen: BLOOD  Result Value Ref Range Status   Specimen Description BLOOD BLOOD RIGHT ARM  Final   Special Requests   Final    BOTTLES DRAWN AEROBIC AND ANAEROBIC Blood Culture adequate volume   Culture   Final    NO GROWTH < 24 HOURS Performed at [facility], [address] [place], [place] [address]    Report Status PENDING  Incomplete  Blood culture (routine x 2)     Status: None (Preliminary result)   Collection Time: 09/24/19  3:30 PM   Specimen: BLOOD  Result Value Ref Range Status   Specimen Description BLOOD BLOOD RIGHT WRIST  Final   Special Requests   Final    BOTTLES DRAWN AEROBIC AND ANAEROBIC Blood Culture adequate volume   Culture   Final    NO GROWTH < 24 HOURS Performed at [facility], [address], [place], [place] [address]    Report Status PENDING  Incomplete  SARS CORONAVIRUS 2 (TAT 6-24 HRS) Nasopharyngeal Nasopharyngeal Swab     Status: None   Collection Time: 09/24/19  6:52 PM   Specimen: Nasopharyngeal Swab  Result Value Ref Range Status   SARS Coronavirus 2 NEGATIVE NEGATIVE Final    Comment: (NOTE) SARS-CoV-2 target nucleic acids are NOT DETECTED. The SARS-CoV-2 RNA is  generally detectable in upper and lower respiratory specimens during the acute phase of infection. Negative results do not preclude SARS-CoV-2 infection, do not rule out co-infections with other pathogens, and should not be used as the sole basis for treatment or other patient management decisions. Negative results must be combined with clinical observations, patient history, and epidemiological information. The expected result is Negative. Fact Sheet for Patients: SugarRoll.be Fact Sheet for Healthcare Providers: https://www.woods-mathews.com/ This test is not yet approved or cleared by the [place] FDA and  has been authorized for detection and/or diagnosis of SARS-CoV-2 by FDA under an Emergency Use Authorization (EUA). This EUA will remain  in effect (meaning this test can be used) for the duration of the COVID-19 declaration under Section 56 4(b)(1) of the Act, 21 U.S.C. section 360bbb-3(b)(1), unless the  authorization is terminated or revoked sooner. Performed at [facility], [place] [address], [place], [place] [address]          Radiology Studies: CT ABDOMEN PELVIS WO CONTRAST  Result Date: 09/24/2019 CLINICAL DATA:  Hydronephrosis. Bilateral nephrostomy tubes in place. Patient presents with nausea, diarrhea and encephalopathy. EXAM: CT ABDOMEN AND PELVIS WITHOUT CONTRAST TECHNIQUE: Multidetector CT imaging of the abdomen and pelvis was performed following the standard protocol without IV contrast. COMPARISON:  08/31/2019 FINDINGS: Lower chest: Moderate right pleural effusion with overlying platelike atelectasis noted. Mild platelike atelectasis is also identified in the left lower lobe. Hepatobiliary: 8 mm low-attenuation structure in right lobe of liver is too small to characterize but appears unchanged from previous exam. The gallbladder is unremarkable. No biliary dilatation. Pancreas: Unremarkable. No pancreatic ductal  dilatation or surrounding inflammatory changes. Spleen: Normal in size without focal abnormality. Adrenals/Urinary Tract: The adrenal glands appear normal. Asymmetric right renal atrophy. Bilateral percutaneous nephrostomy tubes are in place. No significant hydronephrosis identified bilaterally. There are approximately 3 small stones within the distal left ureter which measure up to 2-3 mm. No right ureteral calculi. Urinary bladder is unremarkable. Stomach/Bowel: The stomach is nondistended. The small bowel loops have a normal caliber. Multiple colonic diverticula identified. No bowel wall thickening, inflammation or distension. Vascular/Lymphatic: Aortic atherosclerosis. Infrarenal abdominal aortic aneurysm measures 5.3 cm, image 46/2. Unchanged. No abdominopelvic adenopathy Reproductive: No mass identified. Other: No ascites or focal fluid collections. Fat containing umbilical hernia, unchanged. Musculoskeletal: Diffuse osteopenia. Stable degenerative changes throughout the visualized thoracolumbar spine. IMPRESSION: 1. Bilateral percutaneous nephrostomy tubes are in place without significant hydronephrosis. 2. There are 3 small stones within the distal left ureter measuring up to 2-3 mm. 3. Moderate right pleural effusion with overlying platelike atelectasis. 4. Stable infrarenal abdominal aortic aneurysm measuring 5.3 cm. Recommend followup by abdomen and pelvis CTA in 3-6 months, and vascular surgery referral/consultation if not already obtained. This recommendation follows ACR consensus guidelines: White Paper of the ACR Incidental Findings Committee II on Vascular Findings. J Am Coll Radiol 2013; 10:789-794. Aortic aneurysm NOS (ICD10-I71.9). Aortic Atherosclerosis (ICD10-I70.0). Electronically Signed   By: Kerby Moors M.D.   On: 09/24/2019 17:56   US Venous Img Lower Unilateral Right  Result Date: 09/24/2019 CLINICAL DATA:  Right lower extremity swelling for weeks. EXAM: RIGHT LOWER EXTREMITY VENOUS  DOPPLER ULTRASOUND TECHNIQUE: Gray-scale sonography with compression, as well as color and duplex ultrasound, were performed to evaluate the deep venous system(s) from the level of the common femoral vein through the popliteal and proximal calf veins. COMPARISON:  None. FINDINGS: VENOUS Normal compressibility of the common femoral, superficial femoral, and popliteal veins, as well as the visualized calf veins. Visualized portions of profunda femoral vein and great saphenous vein unremarkable. No filling defects to suggest DVT on grayscale or color Doppler imaging. Doppler waveforms show normal direction of venous flow, normal respiratory phasicity and response to augmentation. Limited views of the contralateral common femoral vein are unremarkable. OTHER Soft tissue edema noted in the calf. Limitations: none IMPRESSION: 1. No evidence of right lower extremity DVT. 2. Mild soft tissue edema in the calf. Electronically Signed   By: Keith Rake M.D.   On: 09/24/2019 18:36   DG Chest Portable 1 View  Result Date: 09/24/2019 CLINICAL DATA:  Weakness. EXAM: PORTABLE CHEST 1 VIEW COMPARISON:  January 08, 2019. FINDINGS: Stable cardiomegaly. Atherosclerosis of thoracic aorta is noted. Left-sided pacemaker is unchanged in position. No pneumothorax is noted. Left lung is clear.  Mild right basilar atelectasis or infiltrate is noted with possible small right pleural effusion. Bony thorax is unremarkable. IMPRESSION: Mild right basilar atelectasis or infiltrate is noted with small right pleural effusion. Aortic Atherosclerosis (ICD10-I70.0). Electronically Signed   By: Marijo Conception M.D.   On: 09/24/2019 15:04   DG Knee Right Port  Result Date: 09/24/2019 CLINICAL DATA:  Right knee pain. Swelling. EXAM: PORTABLE RIGHT KNEE - 1-2 VIEW COMPARISON:  None. FINDINGS: Tricompartmental peripheral spurring. Narrowing of the medial tibiofemoral joint space. No erosion, periosteal reaction, or bony destruction. Trace knee  joint effusion. Mild generalized edema. There are vascular calcifications. IMPRESSION: 1. Tricompartmental osteoarthritis.  No acute osseous abnormality. 2. Trace knee joint effusion with generalized soft tissue edema. Electronically Signed   By: Keith Rake M.D.   On: 09/24/2019 18:20        Scheduled Meds: . Chlorhexidine Gluconate Cloth  6 each Topical Daily  . finasteride  5 mg Oral Daily  . metoprolol tartrate  37.5 mg Oral BID  . pantoprazole  40 mg Oral Daily  . rosuvastatin  5 mg Oral q1800  . sodium bicarbonate  650 mg Oral BID  . tamsulosin  0.4 mg Oral Daily  . torsemide  40 mg Oral BID   Continuous Infusions: . cefTAZidime (FORTAZ)  IV       LOS: 1 day    Time spent: 35 mins     Wyvonnia Dusky, MD [facility] Pager [phone]  If 7PM-7AM, please contact night-coverage [email or web address] 09/25/2019, 8:08 AM

## 2019-09-25 NOTE — Evaluation (Signed)
Occupational Therapy Evaluation Patient Details Name: Blake [name] MRN: [id number] DOB: [birth year] Today's Date: 09/25/2019    History of Present Illness 86 y.o. male with medical history significant for prostate cancer s/p radiation therapy with chronic hydronephrosis with bilateral nephrostomy tube, sick sinus syndrome s/p pacemaker, CKD stage 4, OSA not on CPAP, history of seizure from cefepime, AAA, atrial fibrillation not on anticoagulation, history of SVT, hypertension and history of GI bleed who presents with concerns of altered mental status.   Clinical Impression   Patient seen this date for OT evaluation.  Upon entry, supine in bed and agreeable to therapy.  Son present for session.  States patient had a home health aide that assists with all BADLs.  Provided education that patient could benefit from targeted sessions on toileting, functional transfers, activity tolerance, standing tolerance, and family training.  Patient requiring minimal assistance and extra time for bed mobility this date.  Patient with improved alertness and able to converse appropriately.  Patient stating 8/10 pain in low back/bottom.  Spoke to nurse regarding this and other patient needs.  Prevalon boots in place at end of session. The patient would benefit from further skilled occupational therapy to address the performance components outlined below.  Based on today's performance, with family training and education and continuation of [facility] aides, patient would be safe to return home with [facility].    Follow Up Recommendations  Home health OT;Supervision/Assistance - 24 hour    Equipment Recommendations  None recommended by OT    Recommendations for Other Services       Precautions / Restrictions Precautions Precautions: Fall;Other (comment) Precaution Comments: enteric precautions Restrictions Weight Bearing Restrictions: No Other Position/Activity Restrictions: prevalon boots while in bed. Keep R LE elevated.       Mobility Bed Mobility Overal bed mobility: Needs Assistance Bed Mobility: Supine to Sit;Sit to Supine     Supine to sit: Min assist Sit to supine: Max assist;Mod assist   General bed mobility comments: Per PT note, requiring minimal assistance and extra time to move to EOB.  Transfers Overall transfer level: Needs assistance Equipment used: Rolling walker (2 wheeled) Transfers: Sit to/from Stand Sit to Stand: Min assist         General transfer comment: Requiring MIN A for sit to stand transfers at this time with increase height and cues for sequencing/body mechanics.    Balance Overall balance assessment: Needs assistance Sitting-balance support: Bilateral upper extremity supported Sitting balance-Leahy Scale: Good     Standing balance support: Bilateral upper extremity supported Standing balance-Leahy Scale: Fair Standing balance comment: reliant on walker, knees stayed bent but no LOBs or overt unsteadiness                           ADL either performed or assessed with clinical judgement   ADL Overall ADL's : Needs assistance/impaired                                       General ADL Comments: Per pt's son, pt required assistance for all BADLs at baseline, but lately is requiring more assistance.  prior to hospitalization, they hired an Engineer, production to assist with BADLs due to his worsening condition.  MAX A for dressing and toileting at this time.  MOD-MAX A for self feeding and grooming.     Vision Baseline Vision/History: Macular Degeneration Patient  Visual Report: (Pt's son states he feels as though his vision has gotten worse recently.  WFL for diagnosis.)       Perception     Praxis      Pertinent Vitals/Pain Pain Assessment: 0-10 Pain Score: 8  Faces Pain Scale: Hurts little more Pain Location: low back/bottom Pain Descriptors / Indicators: Aching;Burning;Discomfort Pain Intervention(s): Limited activity within patient's  tolerance;Monitored during session;Patient requesting pain meds-RN notified     Hand Dominance Right   Extremity/Trunk Assessment Upper Extremity Assessment Upper Extremity Assessment: Generalized weakness;Overall WFL for tasks assessed(Age-related weakness but able to participate appropriately)   Lower Extremity Assessment Lower Extremity Assessment: Defer to PT evaluation       Communication Communication Communication: No difficulties   Cognition Arousal/Alertness: Awake/alert Behavior During Therapy: WFL for tasks assessed/performed Overall Cognitive Status: History of cognitive impairments - at baseline(Able to introduce self, difficulty verbalizing he is in hospital.)                                     General Comments  Patient demonstrated increased alertness and able to follow conversation with therapist.  With complaints of pain, limiting participation.  Son states, he appears to be almost back at baseline with functional transfers and ADLs at this time.    Exercises Other Exercises Other Exercises: Provided education to son and patient regarding OT role and goals in acute care setting Other Exercises: Provided general safety education regarding bed controls, call light, etc Other Exercises: provided education on positioning to reduce pain while in bed Other Exercises: provided education on body mechanics, self pacing and sequencing to improve safety during functional transfers   Shoulder Instructions      Home Living Family/patient expects to be discharged to:: Private residence Living Arrangements: Children Available Help at Discharge: Family;Available 24 hours/day;Personal care attendant Type of Home: House Home Access: Ramped entrance     Home Layout: Multi-level;Able to live on main level with bedroom/bathroom     Bathroom Shower/Tub: Hospital doctor Toilet: Handicapped height     Home Equipment: Environmental consultant - 4 wheels;Cane - single  point;Bedside commode;Shower seat - built in;Wheelchair Insurance claims handler - standard          Prior Functioning/Environment Level of Independence: Needs assistance  Gait / Transfers Assistance Needed: limited household ambulator with standard walker, someone always with him ADL's / Homemaking Assistance Needed: Patient required assistance with all BADLs. Communication / Swallowing Assistance Needed: Pt is visually impaired from chronic macular degeneration Comments: Chronic macular degeneration        OT Problem List: Decreased strength;Decreased activity tolerance;Impaired balance (sitting and/or standing)      OT Treatment/Interventions: Self-care/ADL training;Therapeutic exercise;Energy conservation;DME and/or AE instruction;Therapeutic activities;Patient/family education    OT Goals(Current goals can be found in the care plan section) Acute Rehab OT Goals Patient Stated Goal: "feel better" OT Goal Formulation: With patient/family Time For Goal Achievement: 10/09/19 Potential to Achieve Goals: Good  OT Frequency: Min 1X/week   Barriers to D/C:            Co-evaluation              AM-PAC OT "6 Clicks" Daily Activity     Outcome Measure Help from another person eating meals?: A Lot Help from another person taking care of personal grooming?: A Lot Help from another person toileting, which includes using toliet, bedpan, or urinal?: A Lot Help  from another person bathing (including washing, rinsing, drying)?: A Lot Help from another person to put on and taking off regular upper body clothing?: A Lot Help from another person to put on and taking off regular lower body clothing?: A Lot 6 Click Score: 12   End of Session Nurse Communication: Patient requests pain meds(Spoke to nurse regarding pain level, TV remote and request for heated blanket)  Activity Tolerance: Patient limited by pain Patient left: in bed;with call bell/phone within reach;with bed alarm set;with  family/visitor present  OT Visit Diagnosis: Unsteadiness on feet (R26.81);Muscle weakness (generalized) (M62.81)                Time: 2197-5883 OT Time Calculation (min): 19 min Charges:  OT General Charges $OT Visit: 1 Visit OT Evaluation $OT Eval Moderate Complexity: 1 Mod OT Treatments $Therapeutic Activity: 8-22 mins  Baldomero Lamy, MS, OTR/L 09/25/19, 3:05 PM

## 2019-09-25 NOTE — TOC Initial Note (Signed)
Transition of Care [facility]) - Initial/Assessment Note    Patient Details  Name: Blake [name] MRN: [id number] Date of Birth: 12/27/1922  Transition of Care [facility]) CM/SW Contact:    Su Hilt, RN Phone Number: 09/25/2019, 12:40 PM  Clinical Narrative:                 Met with the patient in the room, Talked to the daughter on the phone, The patient lives with his son He has had [facility] in the past for [facility] PT and requested to use them again, I notified Jason with [facility] He does not need additional DME per the daughter, Will continue to monitor for needs   Expected Discharge Plan: [place] Barriers to Discharge: Continued Medical Work up   Patient Goals and CMS Choice Patient states their goals for this hospitalization and ongoing recovery are:: go home   Choice offered to / list presented to : Patient, Adult Children  Expected Discharge Plan and Services Expected Discharge Plan: [place]   Discharge Planning Services: CM Consult Post Acute Care Choice: [place] arrangements for the past 2 months: Single Family Home                 DME Arranged: N/A         HH Arranged: PT HH Agency: [place] ([place]) Date HH Agency Contacted: 09/25/19 Time HH Agency Contacted: 79 Representative spoke with at [place]: Corene Cornea  Prior Living Arrangements/Services Living arrangements for the past 2 months: [place] Lives with:: Adult Children Patient language and need for interpreter reviewed:: Yes Do you feel safe going back to the place where you live?: Yes      Need for Family Participation in Patient Care: Yes (Comment) Care giver support system in place?: Yes (comment)   Criminal Activity/Legal Involvement Pertinent to Current Situation/Hospitalization: No - Comment as needed  Activities of Daily Living Home Assistive Devices/Equipment: Walker (specify type), Bedside commode/3-in-1(standing no wheels) ADL  Screening (condition at time of admission) Patient's cognitive ability adequate to safely complete daily activities?: No Is the patient deaf or have difficulty hearing?: Yes Does the patient have difficulty seeing, even when wearing glasses/contacts?: Yes Does the patient have difficulty concentrating, remembering, or making decisions?: Yes Patient able to express need for assistance with ADLs?: No Does the patient have difficulty dressing or bathing?: Yes Independently performs ADLs?: No Communication: Needs assistance Is this a change from baseline?: Pre-admission baseline Dressing (OT): Dependent Is this a change from baseline?: Pre-admission baseline Grooming: Dependent Is this a change from baseline?: Pre-admission baseline Feeding: Dependent Is this a change from baseline?: Pre-admission baseline Bathing: Dependent Is this a change from baseline?: Pre-admission baseline Toileting: Dependent Is this a change from baseline?: Pre-admission baseline In/Out Bed: Dependent Is this a change from baseline?: Pre-admission baseline Walks in Home: Needs assistance Is this a change from baseline?: Pre-admission baseline Does the patient have difficulty walking or climbing stairs?: Yes Weakness of Legs: Both Weakness of Arms/Hands: Both  Permission Sought/Granted   Permission granted to share information with : Yes, Verbal Permission Granted              Emotional Assessment Appearance:: Appears stated age Attitude/Demeanor/Rapport: Engaged Affect (typically observed): Appropriate Orientation: : Oriented to Self Alcohol / Substance Use: Not Applicable Psych Involvement: No (comment)  Admission diagnosis:  Encephalopathy [G93.40] Acute cystitis without hematuria [N30.00] Pressure injury of right heel, unstageable ([place]) [L89.610] Pressure injury  of skin of sacral region, unspecified injury stage [L89.159] AMS (altered mental status) [R41.82] Patient Active Problem List    Diagnosis Date Noted  . AMS (altered mental status) 09/24/2019  . CKD (chronic kidney disease) stage 4, GFR 15-29 ml/min (HCC) 09/24/2019  . Anemia 09/24/2019  . OSA (obstructive sleep apnea) 09/24/2019  . Seizure ([place]) 01/03/2019  . Acute CHF (congestive heart failure) ([place]) 12/25/2018  . Acute renal failure (ARF) ([place]) 11/21/2018  . AKI (acute kidney injury) ([place])   . Goals of care, counseling/discussion   . Palliative care by specialist   . DNR (do not resuscitate) discussion   . Seizure-like activity ([place]) 11/05/2018  . Allergic rhinitis 07/24/2018  . Cardiac pacemaker in situ 07/24/2018  . History of sleep apnea 07/24/2018  . Macular degeneration 07/24/2018  . Trigeminal neuralgia pain 01/11/2018  . TMJ pain dysfunction syndrome 01/11/2018  . Recurrent UTI 09/26/2017  . Urothelial carcinoma of bladder ([place]) 09/26/2017  . Pressure ulcer, stage II ([place]) 09/12/2017  . SIRS (systemic inflammatory response syndrome) ([place]) 09/10/2017  . Chronic cystitis 08/25/2017  . Sepsis ([place]) 06/10/2017  . CAP (community acquired pneumonia) 06/10/2017  . Infection due to enterococcus 06/07/2017  . UTI (urinary tract infection) 05/17/2017  . Blood in stool   . Hemorrhage of rectum and anus   . Lower GI bleed 10/20/2016  . Near syncope   . Acute renal failure superimposed on chronic kidney disease ([place])   . Pneumonia 10/09/2016  . Aspiration pneumonia ([place]) 10/09/2016  . Acute respiratory failure ([place]) 10/09/2016  . Hydronephrosis, bilateral 07/19/2016  . Spinal stenosis of lumbar region with neurogenic claudication 07/19/2016  . AAA (abdominal aortic aneurysm) without rupture ([place]) 04/27/2016  . Arthritis, degenerative 03/31/2016  . A-fib ([place]) 03/31/2016  . HLD (hyperlipidemia) 03/31/2016  . CA of prostate ([place]) 03/31/2016  . Central perforation of tympanic membrane of right ear 03/26/2016  . Mixed conductive and sensorineural hearing loss of right ear with restricted hearing of left  ear 03/01/2016  . Presbycusis of left ear with restricted hearing of right ear 03/01/2016  . Chronic kidney disease, stage 3 (moderate) 10/30/2015  . Episode of unresponsiveness 10/30/2015  . Sick sinus syndrome ([place]) 10/30/2015  . Thrombocytopenia, unspecified ([place]) 07/24/2015  . Fitting or adjustment of cardiac pacemaker 05/09/2015  . SVT (supraventricular tachycardia) ([place]) 04/09/2015  . Diabetes mellitus, type 2 ([place]) 04/09/2015  . Benign hypertension 04/09/2015  . Acute myocardial infarction, initial episode of care ([place]) 04/09/2015  . Phimosis 01/30/2014  . Bladder outlet obstruction 04/18/2013  . Dysuria 04/18/2013  . Gross hematuria 04/18/2013  . Incomplete emptying of bladder 11/26/2012  . Personal history of prostate cancer 11/26/2012   PCP:  Adin Hector, MD Pharmacy:   [facility], [address] [place] [address] Phone: [phone] Fax: [place], [place] - [address] [employer] [place] [address] Phone: [phone] Fax: [phone]  [place], [place] - [address] [place] [address] Phone: [phone] Fax: [phone]  [id number]: [phone]: [phone]    Social Determinants of Health (SDOH) Interventions    Readmission Risk Interventions Readmission Risk Prevention Plan 09/25/2019 12/30/2018 12/25/2018  Transportation Screening Complete Complete Complete  PCP or Specialist Appt within 3-5 Days Complete - -  HRI or  Home Care Consult Complete - Complete  Social Work Consult for [place] Planning/Counseling Complete - Complete  Palliative Care Screening Not Applicable - -  Medication Review (RN Care Manager) Referral to Pharmacy Complete Complete  HRI or Home Care Consult - Complete  -  Some recent data might be hidden

## 2019-09-25 NOTE — Evaluation (Addendum)
Clinical/Bedside Swallow Evaluation Patient Details  Name: Blake [name] MRN: [id number] Date of Birth: 19-Nov-1922  Today's Date: 09/25/2019 Time: SLP Start Time (ACUTE ONLY): 47 SLP Stop Time (ACUTE ONLY): 4580 SLP Time Calculation (min) (ACUTE ONLY): 45 min  Past Medical History:  Past Medical History:  Diagnosis Date  . AAA (abdominal aortic aneurysm) ([place])   . Arrhythmia   . CHF (congestive heart failure) ([place])   . Diabetes mellitus without complication ([place])   . GERD (gastroesophageal reflux disease)   . GI bleed   . Heart murmur   . Hyperlipidemia   . Hypertension   . Pacemaker   . Pneumonia   . Prostate cancer ([place])   . Sleep apnea    Past Surgical History:  Past Surgical History:  Procedure Laterality Date  . FLEXIBLE SIGMOIDOSCOPY N/A 10/21/2016   Procedure: FLEXIBLE SIGMOIDOSCOPY;  Surgeon: [name], MD;  Location: [facility] ENDOSCOPY;  Service: Endoscopy;  Laterality: N/A;  . INSERT / REPLACE / REMOVE PACEMAKER    . IR NEPHROSTOGRAM LEFT THRU EXISTING ACCESS  01/16/2019  . IR NEPHROSTOMY EXCHANGE LEFT  02/06/2019  . IR NEPHROSTOMY EXCHANGE LEFT  04/12/2019  . IR NEPHROSTOMY EXCHANGE LEFT  06/01/2019  . IR NEPHROSTOMY EXCHANGE LEFT  07/20/2019  . IR NEPHROSTOMY EXCHANGE LEFT  09/19/2019  . IR NEPHROSTOMY EXCHANGE RIGHT  02/06/2019  . IR NEPHROSTOMY EXCHANGE RIGHT  04/12/2019  . IR NEPHROSTOMY EXCHANGE RIGHT  06/01/2019  . IR NEPHROSTOMY EXCHANGE RIGHT  07/20/2019  . IR NEPHROSTOMY EXCHANGE RIGHT  09/19/2019  . IR NEPHROSTOMY PLACEMENT RIGHT  01/01/2019  . JOINT REPLACEMENT    . NEPHROSTOMY Bilateral   . PACEMAKER INSERTION Left 04/14/2015   Procedure: INSERTION PACEMAKER;  Surgeon: Isaias Cowman, MD;  Location: [facility] ORS;  Service: Cardiovascular;  Laterality: Left;  . PROSTATE SURGERY    . VISCERAL ARTERY INTERVENTION N/A 10/22/2016   Procedure: Visceral Artery Intervention;  Surgeon: Algernon Huxley, MD;  Location: [facility];  Service: Cardiovascular;   Laterality: N/A;   HPI:  Pt is a 86 y.o. male with h/o Multiple medical issues including bilateral percutaneous nephrostomy tubes, OSA not on CPAP, history of seizure from cefepime, AAA, Reflux on PPI, atrial fibrillation not on anticoagulation, history of SVT, CHF, DM, HTN, HLD, UTI and Acute on Chronic renal failure III - secondary to obstruction from bilateral hydroureteronephrosis with h/o Bladder cancer, Acute on Chronic diastolic CHF who is here with weakness. Patient is a poor historian. Family reports that over the past 1 week or so, he's had progressively worsening weakness.  He has become more confused and fatigue to the point where today he could no longer get out of bed.  He also had progressive right leg swelling and a new cough.  He is also having several episodes of diarrhea a day with incontinence which is new for him.  He recently had both his nephrostomy tubes replaced a week ago after one fell out. He has had increased cough and poor appetite. He reports that he has also felt some mild pain in his b/l sides at the site of his nephrostomy tubes.  Primary suspicion is encephalopathy 2/2 UTI with atelectasis (causing his cough), RLE dependent edema. DVT study negative. CXR with possible RLL infiltrate, though this could be atelectasis. No other recent CXRs noted per chart notes.   Assessment / Plan / Recommendation Clinical Impression  Pt appears to present w/ an grossly adequate oropharyngeal phase swallow function w/ no overt, clinical s/s of  aspiration noted during this evaluation; reduced risk for aspiration when following general aspiration precautions. However, pt was not wearing his Lower Denture plate which impacted effective mastication of increased textured foods. This can increase oral phase time w/ bolus management/mastication of solids. Pt also appears to exhibit Mild Cognitive decline at this assessment(MRI noted; unsure of pt's baseline Cognitive status and noted family reported  "more confusion" prior to admit) which can increase oral phase time w/ boluses. He also has baseline Reflux/GERD as per previous assessment/note, and is currently on a PPI at baseline. With any episodes of Regurgitation of REFLUX, potential aspiration of REFLUX can occur and can result in pulmonary/respiratory decline from the negative sequelae of such. Pt helped to position upright, head forward in bed for assessment. OM exam appeared grossly [facility] w/ lingual/labial strength in response to stimulation. NSG NA stated pt did "well" during the Lunch meal she helped him w/ w/ no overt coughing or difficulty swallowing noted by her. Pt helped to Hold Cup when drinking thin liquids and consumed po trials of thin liquids, purees, and softened solids w/ no immediate, overt s/s of aspiration noted; no decline in vocal quality or respiratory status noted during/post trials. Laryngeal excursion during the swallow appeared [facility]. Intermittently, a mild throat clear was noted in between po trials as if clearing phlegm. Again, no wet vocal quality noted; no cough. Oral phase appeared grossly [facility] for bolus management and timely A-P transfer; oral clearing was complete post swallows. Pt did exhibit min increased time masticating the increased texture though it was presented in small, single moistened bites(not wearing bottome denture plate). Pt fed self the drinks holding the Cup but was unsteady w/ spoon feeding/eating.  Recommend as before at previous assessments, modifying the diet to Minced foods (moistened) diet w/ thin liquids w/ general aspiration precautions; Reflux precautions. Discussed w/ NSG giving Pills in Puree (crushed) for easier, safer swallowing from this time forward in light of Cognitive status and advanced Age. Precautions posted in room. ST services will monitor pt's status and can be available for education while admitted. MD/NSG updated - similar presentation previously during assessments.  Addendum: pt's  family reported pt "spends a great deal of time chewing his foods really well before swallowing". This Minced diet consistency would benefit him currently in light of illness, weakness, as well as GERD and Cognitive status. This was discussed w/ family member who agreed. Pt also drinks Ensure easily, readily which will be a support for the diet overall.  SLP Visit Diagnosis: Dysphagia, unspecified (R13.10)(min increased oral phase time - w/out bottom dentures)    Aspiration Risk  (reduced following precautions)    Diet Recommendation  Minced diet (dysphagia level 2) moistened foods; thin liquids. This will aid in conservation of energy during meals d/t ease of mastication. General aspiration and Reflux precautions. Feeding Support at meals - pt to help hold Cup when drinking. Reduce Distractions during meals.   Medication Administration: Crushed with puree(for easier, safer swallowing)    Other  Recommendations Recommended Consults: (Dietician support) Oral Care Recommendations: Oral care BID;Oral care before and after PO;Staff/trained caregiver to provide oral care Other Recommendations: (n/a)   Follow up Recommendations None      Frequency and Duration min 1 x/week  1 week       Prognosis Prognosis for Safe Diet Advancement: Fair(-Good) Barriers to Reach Goals: Cognitive deficits;Time post onset;Severity of deficits      Swallow Study   General Date of Onset: 09/24/19 HPI: Pt is  a 86 y.o. male with h/o Multiple medical issues including bilateral percutaneous nephrostomy tubes, OSA not on CPAP, history of seizure from cefepime, AAA, Reflux on PPI, atrial fibrillation not on anticoagulation, history of SVT, CHF, DM, HTN, HLD, UTI and Acute on Chronic renal failure III - secondary to obstruction from bilateral hydroureteronephrosis with h/o Bladder cancer, Acute on Chronic diastolic CHF who is here with weakness. Patient is a poor historian. Family reports that over the past 1 week or so,  he's had progressively worsening weakness.  He has become more confused and fatigue to the point where today he could no longer get out of bed.  He also had progressive right leg swelling and a new cough.  He is also having several episodes of diarrhea a day with incontinence which is new for him.  He recently had both his nephrostomy tubes replaced a week ago after one fell out. He has had increased cough and poor appetite. He reports that he has also felt some mild pain in his b/l sides at the site of his nephrostomy tubes.  Primary suspicion is encephalopathy 2/2 UTI with atelectasis (causing his cough), RLE dependent edema. DVT study negative. CXR with possible RLL infiltrate, though this could be atelectasis.  Type of Study: Bedside Swallow Evaluation Previous Swallow Assessment: 2016; 2018 Diet Prior to this Study: Dysphagia 3 (soft);Thin liquids(recommended previously) Temperature Spikes Noted: No(wbc 5.3) Respiratory Status: Room air History of Recent Intubation: No Behavior/Cognition: Alert;Cooperative;Pleasant mood;Confused;Distractible;Requires cueing Oral Cavity Assessment: Within Functional Limits Oral Care Completed by SLP: Recent completion by staff(prior to lunch) Oral Cavity - Dentition: Dentures, top(not wwearing bottom denture plate) Vision: Functional for self-feeding(holding cup to drink) Self-Feeding Abilities: Able to feed self;Needs assist;Needs set up;Total assist(d/t Cognitive status) Patient Positioning: Upright in bed(needed full positioning support for upright, head forward) Baseline Vocal Quality: Normal Volitional Cough: Strong(cued) Volitional Swallow: Able to elicit(cued)    Oral/Motor/Sensory Function Overall Oral Motor/Sensory Function: Within functional limits(no unilateral lingual/labial weakness noted)   Ice Chips Ice chips: Within functional limits Presentation: Spoon(2 trials)   Thin Liquid Thin Liquid: Within functional limits Presentation: Cup;Self  Fed;Straw(~12+ ozs w/ lunch meal, w/ SLP)    Nectar Thick Nectar Thick Liquid: Not tested   Honey Thick Honey Thick Liquid: Not tested   Puree Puree: Within functional limits Presentation: Spoon(fed; 4 trials)   Solid     Solid: Impaired Presentation: Spoon(fed; 7 trials) Oral Phase Impairments: Impaired mastication(not wearing bottom denture plate) Oral Phase Functional Implications: Prolonged oral transit;Impaired mastication Pharyngeal Phase Impairments: (None) Other Comments: also noted min increased oral phase time w/ bolus manipulation; munching pattern       Orinda Kenner, MS, CCC-SLP Blake [name] 09/25/2019,1:34 PM

## 2019-09-25 NOTE — Progress Notes (Signed)
Initial Nutrition Assessment  RD working remotely.  DOCUMENTATION CODES:   Not applicable  INTERVENTION:  Provide Glucerna Shake po TID, each supplement provides 220 kcal and 10 grams of protein. Patient prefer chocolate.  Provide daily MVI.  NUTRITION DIAGNOSIS:   Increased nutrient needs related to wound healing as evidenced by estimated needs.  GOAL:   Patient will meet greater than or equal to 90% of their needs  MONITOR:   PO intake, Supplement acceptance, Labs, Weight trends, Skin, I & O's  REASON FOR ASSESSMENT:   Malnutrition Screening Tool    ASSESSMENT:   86 year old male with PMHx of prostate cancer s/p XRT, chronic hydronephrosis with bilateral nephrostomy tubes, DM, HTN, HLD, AAA, CHF, sleep apnea, GERD, sick sinus syndrome s/p pacemaker placement in 2016, CKD stage IV, A-fib admitted with acute metabolic encephalopathy likely secondary to UTI.   Attempted to call patient over the phone for nutrition/weight history but he was unable to answer. Noted diet was downgraded to dysphagia 2 with thin liquids by MD today. No meal documentation available at this time so unable to tell how well patient is eating here. Per review of chart patient enjoys chocolate Glucerna.  According to weight history in chart patient went from 99 kg to 81 kg last summer over one month, which is likely not accurate. Weight is back up now and patient is currently 97.5 kg (215 lbs).  Medications reviewed and include: pantoprazole, sodium bicarbonate tablets 650 mg BID, Flomax, torsemide, ceftazidime.  Labs reviewed: BUN 88, Creatinine 3.89.  Unable to determine if patient meets criteria for malnutrition at this time.  NUTRITION - FOCUSED PHYSICAL EXAM:  Unable to complete at this time.  Diet Order:   Diet Order            DIET DYS 2 Room service appropriate? Yes; Fluid consistency: Thin  Diet effective now             EDUCATION NEEDS:   No education needs have been identified  at this time  Skin:  Skin Assessment: Skin Integrity Issues:(Stg III sacrum; DTI right heel)  Last BM:  09/25/2019 medium type 7  Height:   Ht Readings from Last 1 Encounters:  09/24/19 6\' 2"  (1.88 m)   Weight:   Wt Readings from Last 1 Encounters:  09/24/19 97.5 kg   BMI:  Body mass index is 27.6 kg/m.  Estimated Nutritional Needs:   Kcal:  2000-2200  Protein:  110-120 grams  Fluid:  2 L/day  Jacklynn Barnacle, MS, RD, LDN Pager number available on Amion

## 2019-09-26 ENCOUNTER — Ambulatory Visit: Payer: Self-pay | Admitting: Urology

## 2019-09-26 LAB — CBC
HCT: 31.4 % — ABNORMAL LOW (ref 39.0–52.0)
Hemoglobin: 9.9 g/dL — ABNORMAL LOW (ref 13.0–17.0)
MCH: 26.5 pg (ref 26.0–34.0)
MCHC: 31.5 g/dL (ref 30.0–36.0)
MCV: 84.2 fL (ref 80.0–100.0)
Platelets: 94 10*3/uL — ABNORMAL LOW (ref 150–400)
RBC: 3.73 MIL/uL — ABNORMAL LOW (ref 4.22–5.81)
RDW: 17.6 % — ABNORMAL HIGH (ref 11.5–15.5)
WBC: 5.3 10*3/uL (ref 4.0–10.5)
nRBC: 0 % (ref 0.0–0.2)

## 2019-09-26 LAB — MRSA PCR SCREENING: MRSA by PCR: POSITIVE — AB

## 2019-09-26 LAB — BASIC METABOLIC PANEL
Anion gap: 14 (ref 5–15)
BUN: 96 mg/dL — ABNORMAL HIGH (ref 8–23)
CO2: 29 mmol/L (ref 22–32)
Calcium: 8.5 mg/dL — ABNORMAL LOW (ref 8.9–10.3)
Chloride: 98 mmol/L (ref 98–111)
Creatinine, Ser: 3.71 mg/dL — ABNORMAL HIGH (ref 0.61–1.24)
GFR calc Af Amer: 15 mL/min — ABNORMAL LOW (ref >60)
GFR calc non Af Amer: 13 mL/min — ABNORMAL LOW (ref >60)
Glucose, Bld: 160 mg/dL — ABNORMAL HIGH (ref 70–99)
Potassium: 4 mmol/L (ref 3.5–5.1)
Sodium: 141 mmol/L (ref 135–145)

## 2019-09-26 LAB — URINE CULTURE: Culture: >=100000 — AB

## 2019-09-26 MED ORDER — VANCOMYCIN HCL 2000 MG/400ML IV SOLN
2000.0000 mg | Freq: Once | INTRAVENOUS | Status: AC
  Administered 2019-09-26: 2000 mg via INTRAVENOUS
  Filled 2019-09-26 (×2): qty 400

## 2019-09-26 MED ORDER — MUPIROCIN 2 % EX OINT
TOPICAL_OINTMENT | Freq: Two times a day (BID) | CUTANEOUS | Status: DC
  Filled 2019-09-26: qty 22

## 2019-09-26 MED ORDER — SODIUM CHLORIDE 0.9 % IV SOLN
2.0000 g | INTRAVENOUS | Status: DC
  Administered 2019-09-26: 22:00:00 2 g via INTRAVENOUS
  Filled 2019-09-26 (×2): qty 2

## 2019-09-26 NOTE — Progress Notes (Signed)
PROGRESS NOTE    Blake [name]  WJX:[id number] DOB: 08-Apr-1923 DOA: 09/24/2019 PCP: Adin Hector, MD    Brief Narrative:  Blake [name] is a 86 y.o. male with medical history significant for prostate cancer s/p radiation therapy with chronic hydronephrosis with bilateral nephrostomy tube, sick sinus syndrome s/p pacemaker, CKD stage 4, OSA not on CPAP, history of seizure from cefepime, AAA, atrial fibrillation not on anticoagulation, history of SVT, hypertension and history of GI bleed who presents with concerns of altered mental status.    Consultants:     Procedures:   Antimicrobials:   ceftzidine   Subjective: Patient sleepy.  On sternal rub he opens his eyes but falls back asleep.  Upon questioning he nods his head no to having "pain ".  Otherwise he is not participating with exam.  Objective: Vitals:   09/25/19 0751 09/25/19 1622 09/25/19 2320 09/26/19 0812  BP: (!) 142/90 111/77 126/78 (!) 150/98  Pulse: 72 82 86 76  Resp:   16 18  Temp: 98 F (36.7 C) 97.9 F (36.6 C) (!) 97.5 F (36.4 C) 97.8 F (36.6 C)  TempSrc: Oral Oral Oral Oral  SpO2: 98% 99% 98% 100%  Weight:      Height:        Intake/Output Summary (Last 24 hours) at 09/26/2019 1501 Last data filed at 09/26/2019 1405 Gross per 24 hour  Intake 625.12 ml  Output 950 ml  Net -324.88 ml   Filed Weights   09/24/19 1437  Weight: 97.5 kg    Examination:  General exam: Appears calm and comfortable, sleepy, NAD Respiratory system: Clear to auscultation. Respiratory effort normal. Cardiovascular system: S1 & S2 heard, RRR. No JVD, murmurs, rubs, gallops or clicks.  Gastrointestinal system: Abdomen is nondistended, soft and nontender. Normal bowel sounds heard. Central nervous system: Unable to assess due to patient not participating with exam Extremities: No edema Skin: Warm dry Psychiatry: unable to assess    Data Reviewed: I have personally reviewed following labs and imaging  studies  CBC: Recent Labs  Lab 09/24/19 1530 09/25/19 0448 09/26/19 0539  WBC 6.1 5.3 5.3  NEUTROABS 4.9  --   --   HGB 9.6* 10.2* 9.9*  HCT 31.2* 32.1* 31.4*  MCV 86.7 85.4 84.2  PLT 93* 95* 94*   Basic Metabolic Panel: Recent Labs  Lab 09/24/19 1530 09/25/19 0448 09/26/19 0539  NA 134* 140 141  K 3.3* 3.5 4.0  CL 93* 99 98  CO2 27 30 29   GLUCOSE 156* 171* 160*  BUN 93* 88* 96*  CREATININE 4.15* 3.89* 3.71*  CALCIUM 7.9* 8.3* 8.5*   GFR: Estimated Creatinine Clearance: 14.2 mL/min (A) (by C-G formula based on SCr of 3.71 mg/dL (H)). Liver Function Tests: Recent Labs  Lab 09/24/19 1530  AST 18  ALT 17  ALKPHOS 86  BILITOT 0.8  PROT 7.1  ALBUMIN 2.7*   No results for input(s): LIPASE, AMYLASE in the last 168 hours. No results for input(s): AMMONIA in the last 168 hours. Coagulation Profile: No results for input(s): INR, PROTIME in the last 168 hours. Cardiac Enzymes: No results for input(s): CKTOTAL, CKMB, CKMBINDEX, TROPONINI in the last 168 hours. BNP (last 3 results) No results for input(s): PROBNP in the last 8760 hours. HbA1C: No results for input(s): HGBA1C in the last 72 hours. CBG: No results for input(s): GLUCAP in the last 168 hours. Lipid Profile: No results for input(s): CHOL, HDL, LDLCALC, TRIG, CHOLHDL, LDLDIRECT in the last 72  hours. Thyroid Function Tests: No results for input(s): TSH, T4TOTAL, FREET4, T3FREE, THYROIDAB in the last 72 hours. Anemia Panel: No results for input(s): VITAMINB12, FOLATE, FERRITIN, TIBC, IRON, RETICCTPCT in the last 72 hours. Sepsis Labs: Recent Labs  Lab 09/24/19 1530  LATICACIDVEN 1.7    Recent Results (from the past 240 hour(s))  Blood culture (routine x 2)     Status: Abnormal (Preliminary result)   Collection Time: 09/24/19  3:16 PM   Specimen: BLOOD  Result Value Ref Range Status   Specimen Description   Final    BLOOD BLOOD RIGHT ARM Performed at [facility], [address],  [place], [place] [address]    Special Requests   Final    BOTTLES DRAWN AEROBIC AND ANAEROBIC Blood Culture adequate volume Performed at [facility], [address] [place], [place] [address]    Culture  Setup Time   Final    ANAEROBIC BOTTLE ONLY GRAM POSITIVE COCCI CRITICAL RESULT CALLED TO, READ BACK BY AND VERIFIED WITH: CHRISTINE KATSOUDAS AT 3662 ON 09/25/2019 [place]. Performed at [facility], [address] [place], [place] [address]    Culture (A)  Final    STAPHYLOCOCCUS SPECIES (COAGULASE NEGATIVE) THE SIGNIFICANCE OF ISOLATING THIS ORGANISM FROM A SINGLE SET OF BLOOD CULTURES WHEN MULTIPLE SETS ARE DRAWN IS UNCERTAIN. PLEASE NOTIFY THE MICROBIOLOGY DEPARTMENT WITHIN ONE WEEK IF SPECIATION AND SENSITIVITIES ARE REQUIRED. Performed at [facility], [place] [address] [place], [place] [address]    Report Status PENDING  Incomplete  Blood culture (routine x 2)     Status: None (Preliminary result)   Collection Time: 09/24/19  3:30 PM   Specimen: BLOOD  Result Value Ref Range Status   Specimen Description BLOOD BLOOD RIGHT WRIST  Final   Special Requests   Final    BOTTLES DRAWN AEROBIC AND ANAEROBIC Blood Culture adequate volume   Culture   Final    NO GROWTH 2 DAYS Performed at [facility], [address] [place], [place] [address]    Report Status PENDING  Incomplete  Urine Culture     Status: Abnormal   Collection Time: 09/24/19  3:30 PM   Specimen: Urine, Random  Result Value Ref Range Status   Specimen Description   Final    URINE, RANDOM Performed at [facility], [address] [place], [place] [address]    Special Requests   Final    NONE Performed at [facility], [place],  [address]    Culture (A)  Final    >=100,000 COLONIES/mL METHICILLIN RESISTANT STAPHYLOCOCCUS AUREUS   Report Status 09/26/2019 FINAL  Final   Organism ID, Bacteria METHICILLIN RESISTANT STAPHYLOCOCCUS AUREUS (A)   Final      Susceptibility   Methicillin resistant staphylococcus aureus - MIC*    CIPROFLOXACIN >=8 RESISTANT Resistant     GENTAMICIN <=0.5 SENSITIVE Sensitive     NITROFURANTOIN <=16 SENSITIVE Sensitive     OXACILLIN >=4 RESISTANT Resistant     TETRACYCLINE >=16 RESISTANT Resistant     VANCOMYCIN <=0.5 SENSITIVE Sensitive     TRIMETH/SULFA <=10 SENSITIVE Sensitive     CLINDAMYCIN >=8 RESISTANT Resistant     RIFAMPIN <=0.5 SENSITIVE Sensitive     Inducible Clindamycin NEGATIVE Sensitive     * >=100,000 COLONIES/mL METHICILLIN RESISTANT STAPHYLOCOCCUS AUREUS  C difficile quick scan w PCR reflex     Status: None   Collection Time: 09/24/19  4:00 PM   Specimen:  STOOL  Result Value Ref Range Status   C Diff antigen NEGATIVE NEGATIVE Final   C Diff toxin NEGATIVE NEGATIVE Final   C Diff interpretation No C. difficile detected.  Final    Comment: Performed at [facility], [place], [place] [address]  SARS CORONAVIRUS 2 (TAT 6-24 HRS) Nasopharyngeal Nasopharyngeal Swab     Status: None   Collection Time: 09/24/19  6:52 PM   Specimen: Nasopharyngeal Swab  Result Value Ref Range Status   SARS Coronavirus 2 NEGATIVE NEGATIVE Final    Comment: (NOTE) SARS-CoV-2 target nucleic acids are NOT DETECTED. The SARS-CoV-2 RNA is generally detectable in upper and lower respiratory specimens during the acute phase of infection. Negative results do not preclude SARS-CoV-2 infection, do not rule out co-infections with other pathogens, and should not be used as the sole basis for treatment or other patient management decisions. Negative results must be combined with clinical observations, patient history, and epidemiological information. The expected result is Negative. Fact Sheet for Patients: SugarRoll.be Fact Sheet for Healthcare Providers: https://www.woods-mathews.com/ This test is not yet approved or cleared by the [place] FDA and  has been authorized for detection and/or diagnosis of SARS-CoV-2 by FDA under an Emergency Use Authorization (EUA). This EUA will remain  in effect (meaning this test can be used) for the duration of the COVID-19 declaration under Section 56 4(b)(1) of the Act, 21 U.S.C. section 360bbb-3(b)(1), unless the authorization is terminated or revoked sooner. Performed at [facility], [place] [address] [place], [place] [address]          Radiology Studies: CT ABDOMEN PELVIS WO CONTRAST  Result Date: 09/24/2019 CLINICAL DATA:  Hydronephrosis. Bilateral nephrostomy tubes in place. Patient presents with nausea, diarrhea and encephalopathy. EXAM: CT ABDOMEN AND PELVIS WITHOUT CONTRAST TECHNIQUE: Multidetector CT imaging of the abdomen and pelvis was performed following the standard protocol without IV contrast. COMPARISON:  08/31/2019 FINDINGS: Lower chest: Moderate right pleural effusion with overlying platelike atelectasis noted. Mild platelike atelectasis is also identified in the left lower lobe. Hepatobiliary: 8 mm low-attenuation structure in right lobe of liver is too small to characterize but appears unchanged from previous exam. The gallbladder is unremarkable. No biliary dilatation. Pancreas: Unremarkable. No pancreatic ductal dilatation or surrounding inflammatory changes. Spleen: Normal in size without focal abnormality. Adrenals/Urinary Tract: The adrenal glands appear normal. Asymmetric right renal atrophy. Bilateral percutaneous nephrostomy tubes are in place. No significant hydronephrosis identified bilaterally. There are approximately 3 small stones within the distal left ureter which measure up to 2-3 mm. No right ureteral calculi. Urinary bladder is unremarkable. Stomach/Bowel: The stomach is nondistended. The small bowel loops have a normal caliber. Multiple colonic diverticula identified. No bowel wall thickening, inflammation or distension. Vascular/Lymphatic: Aortic  atherosclerosis. Infrarenal abdominal aortic aneurysm measures 5.3 cm, image 46/2. Unchanged. No abdominopelvic adenopathy Reproductive: No mass identified. Other: No ascites or focal fluid collections. Fat containing umbilical hernia, unchanged. Musculoskeletal: Diffuse osteopenia. Stable degenerative changes throughout the visualized thoracolumbar spine. IMPRESSION: 1. Bilateral percutaneous nephrostomy tubes are in place without significant hydronephrosis. 2. There are 3 small stones within the distal left ureter measuring up to 2-3 mm. 3. Moderate right pleural effusion with overlying platelike atelectasis. 4. Stable infrarenal abdominal aortic aneurysm measuring 5.3 cm. Recommend followup by abdomen and pelvis CTA in 3-6 months, and vascular surgery referral/consultation if not already obtained. This recommendation follows ACR consensus guidelines: White Paper of the ACR Incidental Findings Committee II on Vascular Findings. J Am  Coll Radiol 2013; 10:789-794. Aortic aneurysm NOS (ICD10-I71.9). Aortic Atherosclerosis (ICD10-I70.0). Electronically Signed   By: Kerby Moors M.D.   On: 09/24/2019 17:56   US Venous Img Lower Unilateral Right  Result Date: 09/24/2019 CLINICAL DATA:  Right lower extremity swelling for weeks. EXAM: RIGHT LOWER EXTREMITY VENOUS DOPPLER ULTRASOUND TECHNIQUE: Gray-scale sonography with compression, as well as color and duplex ultrasound, were performed to evaluate the deep venous system(s) from the level of the common femoral vein through the popliteal and proximal calf veins. COMPARISON:  None. FINDINGS: VENOUS Normal compressibility of the common femoral, superficial femoral, and popliteal veins, as well as the visualized calf veins. Visualized portions of profunda femoral vein and great saphenous vein unremarkable. No filling defects to suggest DVT on grayscale or color Doppler imaging. Doppler waveforms show normal direction of venous flow, normal respiratory phasicity and  response to augmentation. Limited views of the contralateral common femoral vein are unremarkable. OTHER Soft tissue edema noted in the calf. Limitations: none IMPRESSION: 1. No evidence of right lower extremity DVT. 2. Mild soft tissue edema in the calf. Electronically Signed   By: Keith Rake M.D.   On: 09/24/2019 18:36   DG Knee Right Port  Result Date: 09/24/2019 CLINICAL DATA:  Right knee pain. Swelling. EXAM: PORTABLE RIGHT KNEE - 1-2 VIEW COMPARISON:  None. FINDINGS: Tricompartmental peripheral spurring. Narrowing of the medial tibiofemoral joint space. No erosion, periosteal reaction, or bony destruction. Trace knee joint effusion. Mild generalized edema. There are vascular calcifications. IMPRESSION: 1. Tricompartmental osteoarthritis.  No acute osseous abnormality. 2. Trace knee joint effusion with generalized soft tissue edema. Electronically Signed   By: Keith Rake M.D.   On: 09/24/2019 18:20        Scheduled Meds: . Chlorhexidine Gluconate Cloth  6 each Topical Daily  . diclofenac Sodium  2 g Topical QID  . feeding supplement (GLUCERNA SHAKE)  237 mL Oral TID BM  . finasteride  5 mg Oral Daily  . metoprolol tartrate  37.5 mg Oral BID  . multivitamin with minerals  1 tablet Oral Daily  . pantoprazole  40 mg Oral Daily  . rosuvastatin  5 mg Oral q1800  . sodium bicarbonate  650 mg Oral BID  . tamsulosin  0.4 mg Oral Daily  . torsemide  40 mg Oral BID   Continuous Infusions: . cefTAZidime (FORTAZ)  IV    . vancomycin      Assessment & Plan:   Principal Problem:   AMS (altered mental status) Active Problems:   Benign hypertension   A-fib (HCC)   AAA (abdominal aortic aneurysm) without rupture (HCC)   Pressure ulcer, stage II (HCC)   Thrombocytopenia, unspecified (HCC)   CKD (chronic kidney disease) stage 4, GFR 15-29 ml/min (HCC)   Anemia   OSA (obstructive sleep apnea)  Acute metabolic encephalopathy: likely secondary to UTI in the setting of chronic  hydronephrosis with bilateral nephrostomy tubes. Hx of pseudomonas in urine culture. Continue Ceftazidine.  ucx with MRSA, pt with ckd, possible only one to use may be rifampin and vanco. For now will give vanco , until MS improves, then will side line ID about rifampin.  Unlikely bacteremia: 1 of 2 growing gram positive cocci in anaerobic bottle only. Likely a containment.  Will repeat bcx  Chronic hydronephrosis: with bilateral nephrostomy tube. Continue proscar and flomax   Thrombocytopenia: etiology unclear. No need for a transfusion at this time. no signs of bleed Will continue to monitor   Stage 2 Pressure ulcer  wound: on left buttock. Wound care consulted  Right heel ulcer: stage I. Wound care consulted   Abdominal aortic aneurysm: stable at 5.3 cm. Will need CTA abdomen pelvis follow-up in 3 to 6 months outpatient  Chronic anemia: no need for a transfusion at this time. Will continue to monitor   PAF: not on anticoagulation. Continue metoprolol  CKDIV: stable. Continue sodium bicarbonate. Will continue to monitor   Hypertension: continue metoprolol and torsemide  OSA: not on CPAP  Generalized weakness: PT /OT recs home health    DVT prophylaxis: scd Code Status:  Full  family Communication: None at bedside Disposition Plan: Will return home however right now mental status is still not improved.  Requiring IV antibiotics. Barrier: Mental status has not improved yet.  Requiring IV antibiotics.  Will DC in couple of days if above issues resolves       LOS: 2 days   Time spent: 45 minutes with more than 50% on [place], MD [facility] Pager [phone]  If 7PM-7AM, please contact night-coverage [email or web address] Password [facility] 09/26/2019, 3:01 PM

## 2019-09-26 NOTE — Plan of Care (Signed)
  Problem: Health Behavior/Discharge Planning: Goal: Ability to manage health-related needs will improve Outcome: Progressing   Problem: Clinical Measurements: Goal: Ability to maintain clinical measurements within normal limits will improve Outcome: Progressing Goal: Will remain free from infection Outcome: Progressing Goal: Diagnostic test results will improve Outcome: Progressing Goal: Respiratory complications will improve Outcome: Progressing Goal: Cardiovascular complication will be avoided Outcome: Progressing   Problem: Activity: Goal: Risk for activity intolerance will decrease Outcome: Progressing   Problem: Nutrition: Goal: Adequate nutrition will be maintained Outcome: Progressing   Problem: Coping: Goal: Level of anxiety will decrease Outcome: Progressing   Problem: Elimination: Goal: Will not experience complications related to bowel motility Outcome: Progressing Goal: Will not experience complications related to urinary retention Outcome: Progressing   Problem: Skin Integrity: Goal: Risk for impaired skin integrity will decrease Outcome: Progressing   

## 2019-09-26 NOTE — Progress Notes (Signed)
Daughter Blake [name] called and update given re pt.

## 2019-09-26 NOTE — Progress Notes (Signed)
PT Cancellation Note  Patient Details Name: Blake [name] MRN: [id number] DOB: August 09, 1922   Cancelled Treatment:     PT attempt. Pt was asleep upon arriving with RN in room. He does wake but quickly falls back to sleep. Too lethargic to safely participate in PT at this time. Will continue to follow per POC and progress as able per pt tolerance.    Willette Pa 09/26/2019, 3:39 PM

## 2019-09-27 LAB — CULTURE, BLOOD (ROUTINE X 2): Special Requests: ADEQUATE

## 2019-09-27 MED ORDER — SULFAMETHOXAZOLE-TRIMETHOPRIM 800-160 MG PO TABS
1.0000 | ORAL_TABLET | Freq: Every day | ORAL | Status: DC
  Administered 2019-09-27 – 2019-09-28 (×2): 1 via ORAL
  Filled 2019-09-27 (×2): qty 1

## 2019-09-27 NOTE — Progress Notes (Signed)
Physical Therapy Treatment Patient Details Name: Blake [name] MRN: [id number] DOB: 1922/10/08 Today's Date: 09/27/2019    History of Present Illness 86 y.o. male with medical history significant for prostate cancer s/p radiation therapy with chronic hydronephrosis with bilateral nephrostomy tube, sick sinus syndrome s/p pacemaker, CKD stage 4, OSA not on CPAP, history of seizure from cefepime, AAA, atrial fibrillation not on anticoagulation, history of SVT, hypertension and history of GI bleed who presents with concerns of altered mental status.    PT Comments    Pt was long sitting in bed upon arriving. He is alert and cooperative.  Cognition much improved from previous date. Pt agreeable to OOB activity. He exited L side of bed with increased time and vcs for safe sequencing. He required Mod assist to perform bed mobility but once seated EOB, pt able to maintain sitting balance with Supervision. Pt stood 3 x from EOB with min-mod assist and then ambulated 12 ft using RW. He fatigued extremely quickly. Knees flexed during gait but no LOB noted. Pt was repositioned in bed post session with bed alarm in place, call bell in reach and RN aware of pt's abilities. PT recommends home with 24 hour supervision for safety. Therapist attempted to reach out to family to discuss pt's progress however pt's son unable to answer. PT will continue to follow per POC. He will require continued PT at home to address deficits with gait, strength, and safe functional mobility.       Follow Up Recommendations  Home health PT;Supervision/Assistance - 24 hour(per notes family wants to take pt home at DC)     Equipment Recommendations  Rolling walker with 5" wheels    Recommendations for Other Services       Precautions / Restrictions Precautions Precautions: Fall Precaution Comments: enteric precautions Restrictions Weight Bearing Restrictions: No    Mobility  Bed Mobility Overal bed mobility: Needs  Assistance Bed Mobility: Supine to Sit;Sit to Supine     Supine to sit: Mod assist Sit to supine: Mod assist   General bed mobility comments: Mod assist to sup>sit and return. Extra time required with vcs for technique and sequencing  Transfers Overall transfer level: Needs assistance Equipment used: Rolling walker (2 wheeled) Transfers: Sit to/from Stand Sit to Stand: From elevated surface;Min assist;Mod assist         General transfer comment: Min/mod  assist to stand EOB 3 x with progressively lower surface height each trial.  Vcs for handplacement and technique throughout. pt had BM on third trial and therapist total care to clean.   Ambulation/Gait Ambulation/Gait assistance: Min assist Gait Distance (Feet): 12 Feet Assistive device: Rolling walker (2 wheeled) Gait Pattern/deviations: Shuffle;Narrow base of support(knes slightly flexed throughout) Gait velocity: decreased   General Gait Details: pt was able to ambulate ~ 12 ft with RW but required increased time and Vcs for safety and sequencing. Vcs throughout for posture correction. Pt very fatigue after ambulating 12 ft   Stairs             Wheelchair Mobility    Modified Rankin (Stroke Patients Only)       Balance Overall balance assessment: Needs assistance Sitting-balance support: Feet supported Sitting balance-Leahy Scale: Good Sitting balance - Comments: pt sat EOB x ~ 5 minutes without LOB. he was able to reach outside BOS to grab cup without LOB   Standing balance support: Bilateral upper extremity supported Standing balance-Leahy Scale: Fair Standing balance comment: reliant on walker, knees stayed bent but  no LOBs or overt unsteadiness                            Cognition Arousal/Alertness: Awake/alert Behavior During Therapy: WFL for tasks assessed/performed Overall Cognitive Status: History of cognitive impairments - at baseline                                  General Comments: Pt was much more alert this date versus previous date. He was able to follow simple commands throughout. he was however disoriented to  day/time and reasion for being in hospital.      Exercises      General Comments        Pertinent Vitals/Pain Pain Assessment: 0-10 Pain Score: 3  Faces Pain Scale: Hurts a little bit Pain Location: low back/bottom Pain Descriptors / Indicators: Aching;Burning;Discomfort Pain Intervention(s): Limited activity within patient's tolerance;Monitored during session    Home Living                      Prior Function            PT Goals (current goals can now be found in the care plan section) Acute Rehab PT Goals Patient Stated Goal: get better so I can leave Progress towards PT goals: Progressing toward goals    Frequency    Min 2X/week      PT Plan Current plan remains appropriate    Co-evaluation              AM-PAC PT "6 Clicks" Mobility   Outcome Measure  Help needed turning from your back to your side while in a flat bed without using bedrails?: A Little Help needed moving from lying on your back to sitting on the side of a flat bed without using bedrails?: A Little Help needed moving to and from a bed to a chair (including a wheelchair)?: A Little Help needed standing up from a chair using your arms (e.g., wheelchair or bedside chair)?: A Little Help needed to walk in hospital room?: A Lot Help needed climbing 3-5 steps with a railing? : A Lot 6 Click Score: 16    End of Session Equipment Utilized During Treatment: Gait belt Activity Tolerance: Patient tolerated treatment well Patient left: with bed alarm set;with call bell/phone within reach Nurse Communication: Mobility status PT Visit Diagnosis: Muscle weakness (generalized) (M62.81);Difficulty in walking, not elsewhere classified (R26.2)     Time: 9735-3299 PT Time Calculation (min) (ACUTE ONLY): 17 min  Charges:  $Therapeutic  Activity: 8-22 mins                     Julaine Fusi PTA 09/27/19, 4:33 PM

## 2019-09-27 NOTE — Progress Notes (Signed)
Occupational Therapy Treatment Patient Details Name: Blake [name] MRN: [id number] DOB: 1922-10-13 Today's Date: 09/27/2019    History of present illness 86 y.o. male with medical history significant for prostate cancer s/p radiation therapy with chronic hydronephrosis with bilateral nephrostomy tube, sick sinus syndrome s/p pacemaker, CKD stage 4, OSA not on CPAP, history of seizure from cefepime, AAA, atrial fibrillation not on anticoagulation, history of SVT, hypertension and history of GI bleed who presents with concerns of altered mental status.   OT comments  Patient seen this afternoon for OT session.  Patient in supine in bed and agreeable to therapy.  States he is in no pain and needs to use the restroom.  Attempted to move to EOB for [facility] but unable secondary to lethargy/1 assist.  Unable despite 2 assist.  Assisted patient back supine to utilize bedpan.  MAX A x 1-2 for log rolling and TOTAL A for pericare this date.  Required extended time for all tasks due to lethargy, cognitive deficits and poor activity tolerance, Patient's daughter present for portion of treatment.  Provided education on role and goals of OT in acute care setting.  Verbalized understanding.  Patient would benefit from continued occupational therapy sessions to address safety awareness, family training/education, functional transfers and toileting tasks.  Based on today's performance recommending [place] OT vs SNF.  Patient's family do not wish to take him to SNF, however, the amount of assist needed for his care can change dramatically daily.   Follow Up Recommendations  Home health OT;Supervision/Assistance - 24 hour    Equipment Recommendations  None recommended by OT    Recommendations for Other Services      Precautions / Restrictions Precautions Precautions: Fall;Other (comment)(enteric, contact (MRSA)) Precaution Comments: enteric precautions Restrictions Weight Bearing Restrictions: No Other  Position/Activity Restrictions: prevalon boots while in bed. Keep R LE elevated.       Mobility Bed Mobility Overal bed mobility: Needs Assistance Bed Mobility: Rolling;Supine to Sit;Sit to Supine Rolling: Max assist;+2 for physical assistance   Supine to sit: Total assist;HOB elevated;+2 for physical assistance Sit to supine: Total assist;HOB elevated;+2 for physical assistance   General bed mobility comments: Attempted to sit at EOB, but unable despite assistance and use of bed controls  Transfers Overall transfer level: Needs assistance Equipment used: Rolling walker (2 wheeled) Transfers: Sit to/from Stand Sit to Stand: From elevated surface;Min assist;Mod assist         General transfer comment: Unable this date    Balance                            ADL either performed or assessed with clinical judgement   ADL Overall ADL's : Needs assistance/impaired                           Toilet Transfer Details (indicate cue type and reason): Unable to perform. Toileting- Clothing Manipulation and Hygiene: Total assistance;Bed level Toileting - Clothing Manipulation Details (indicate cue type and reason): TOTAL A at bed level       General ADL Comments: Patient reports needing to use restroom.  Attempted to move to EOB requiring MAX A.  Unable despite MAX A x 1-2.  Performed toileting using bed pan for TOTAL A +2     Vision Baseline Vision/History: Macular Degeneration     Perception     Praxis      Cognition Arousal/Alertness: Awake/alert Behavior  During Therapy: WFL for tasks assessed/performed Overall Cognitive Status: History of cognitive impairments - at baseline                                 General Comments: ABle to follow one step directions with hand over hand assistance        Exercises Other Exercises Other Exercises: Provided education to daughter on OT role and goals in acute care setting Other Exercises:  Attempted to perform toileting session this date.  Unable to go from supine<>sit despite x2 assist.  Toileting using bedpan/log rolling with MAX A x 2/TOTAL A Other Exercises: Positioned patient upright in bed with TOTAL A x 2   Shoulder Instructions       General Comments Daughter present for portion of session    Pertinent Vitals/ Pain       Pain Assessment: No/denies pain Pain Score: 3  Faces Pain Scale: Hurts a little bit Pain Location: low back/bottom Pain Descriptors / Indicators: Aching;Burning;Discomfort Pain Intervention(s): Limited activity within patient's tolerance;Monitored during session  Home Living                                          Prior Functioning/Environment              Frequency  Min 1X/week        Progress Toward Goals  OT Goals(current goals can now be found in the care plan section)  Progress towards OT goals: OT to reassess next treatment  Acute Rehab OT Goals Patient Stated Goal: get better so I can leave  Plan Discharge plan remains appropriate;Frequency remains appropriate    Co-evaluation                 AM-PAC OT "6 Clicks" Daily Activity     Outcome Measure   Help from another person eating meals?: A Lot Help from another person taking care of personal grooming?: A Lot Help from another person toileting, which includes using toliet, bedpan, or urinal?: Total Help from another person bathing (including washing, rinsing, drying)?: Total Help from another person to put on and taking off regular upper body clothing?: A Lot Help from another person to put on and taking off regular lower body clothing?: Total 6 Click Score: 9    End of Session    OT Visit Diagnosis: Unsteadiness on feet (R26.81);Muscle weakness (generalized) (M62.81)   Activity Tolerance Patient limited by lethargy   Patient Left in bed;with call bell/phone within reach;with bed alarm set;with family/visitor present   Nurse  Communication Other (comment)(requiring assistance for toileting)        Time: 8616-8372 OT Time Calculation (min): 33 min  Charges: OT General Charges $OT Visit: 1 Visit OT Treatments $Self Care/Home Management : 23-37 mins  Baldomero Lamy, MS, OTR/L 09/27/19, 5:18 PM

## 2019-09-27 NOTE — Progress Notes (Deleted)
Occupational Therapy Treatment Patient Details Name: Blake [name] MRN: [id number] DOB: 07/27/1922 Today's Date: 09/27/2019    History of present illness 86 y.o. male with medical history significant for prostate cancer s/p radiation therapy with chronic hydronephrosis with bilateral nephrostomy tube, sick sinus syndrome s/p pacemaker, CKD stage 4, OSA not on CPAP, history of seizure from cefepime, AAA, atrial fibrillation not on anticoagulation, history of SVT, hypertension and history of GI bleed who presents with concerns of altered mental status.   OT comments  Patient seen in afternoon and agreeable to therapy.  Patient states he is in no pain and needs to use the restroom.  Attempted to move to EOB, but unable with 1 assist.  Continued to be unable despite 2 assist.  Assisted patient back supine to use bedpan.  MAX A x 1-2 for log rolling this date and TOTAL A for pericare.  Required extended time for all tasks due to lethargy, cognitive deficits and poor activity tolerance.  Patient's daughter present for portion of treatment.  Provided education on role and goals of OT in acute care setting.  Verbalized understanding. Patient would benefit from continued occupational therapy sessions to address safety awareness, family training/education, functional transfers and toileting tasks.  Based on today's performance recommending [place] OT vs SNF.  Patient's family do not wish to take him to SNF, however, the amount of assist needed for his care can change dramatically daily.     Follow Up Recommendations  Home health OT;Supervision/Assistance - 24 hour(Family does not want him to go to SNF.)    Equipment Recommendations  None recommended by OT    Recommendations for Other Services      Precautions / Restrictions Precautions Precautions: Fall;Other (comment)(Enteric, contact precautions (MRSA)) Precaution Comments: enteric precautions Restrictions Weight Bearing Restrictions: No Other  Position/Activity Restrictions: prevalon boots while in bed. Keep R LE elevated.       Mobility Bed Mobility Overal bed mobility: Needs Assistance Bed Mobility: Rolling;Supine to Sit;Sit to Supine Rolling: Max assist;+2 for physical assistance   Supine to sit: Total assist;HOB elevated;+2 for physical assistance Sit to supine: Total assist;HOB elevated;+2 for physical assistance   General bed mobility comments: Attempted to sit at EOB, but unable despite assistance and use of bed controls  Transfers Overall transfer level: Needs assistance Equipment used: Rolling walker (2 wheeled) Transfers: Sit to/from Stand Sit to Stand: From elevated surface;Min assist;Mod assist         General transfer comment: Unable this date    Balance Overall balance assessment: Needs assistance Sitting-balance support: Feet supported Sitting balance-Leahy Scale: Good Sitting balance - Comments: pt sat EOB x ~ 5 minutes without LOB. he was able to reach outside BOS to grab cup without LOB   Standing balance support: Bilateral upper extremity supported Standing balance-Leahy Scale: Fair Standing balance comment: reliant on walker, knees stayed bent but no LOBs or overt unsteadiness                           ADL either performed or assessed with clinical judgement   ADL Overall ADL's : Needs assistance/impaired                           Toilet Transfer Details (indicate cue type and reason): Unable to perform. Toileting- Clothing Manipulation and Hygiene: Total assistance;Bed level Toileting - Clothing Manipulation Details (indicate cue type and reason): TOTAL A at bed level  General ADL Comments: Patient reports needing to use restroom.  Attempted to move to EOB requiring MAX A.  Unable despite MAX A x 2.  Performed toileting using bed pan for TOTAL A +2     Vision Baseline Vision/History: Macular Degeneration     Perception     Praxis      Cognition  Arousal/Alertness: Awake/alert Behavior During Therapy: WFL for tasks assessed/performed Overall Cognitive Status: History of cognitive impairments - at baseline                                 General Comments: ABle to follow one step directions with hand over hand assistance        Exercises Other Exercises Other Exercises: Provided education to daughter on OT role and goals in acute care setting Other Exercises: Attempted to perform toileting session this date.  Unable to go from supine<>sit despite x2 assist.  Toileting using bedpan/log rolling with MAX A x 2/TOTAL A Other Exercises: Positioned patient upright in bed with TOTAL A x 2   Shoulder Instructions       General Comments Daughter present for portion of session    Pertinent Vitals/ Pain       Pain Assessment: No/denies pain Pain Score: 3  Faces Pain Scale: Hurts a little bit Pain Location: low back/bottom Pain Descriptors / Indicators: Aching;Burning;Discomfort Pain Intervention(s): Limited activity within patient's tolerance;Monitored during session  Home Living                                          Prior Functioning/Environment              Frequency  Min 1X/week        Progress Toward Goals  OT Goals(current goals can now be found in the care plan section)  Progress towards OT goals: OT to reassess next treatment  Acute Rehab OT Goals Patient Stated Goal: get better so I can leave  Plan Discharge plan remains appropriate;Frequency remains appropriate    Co-evaluation                 AM-PAC OT "6 Clicks" Daily Activity     Outcome Measure   Help from another person eating meals?: A Lot Help from another person taking care of personal grooming?: A Lot Help from another person toileting, which includes using toliet, bedpan, or urinal?: Total Help from another person bathing (including washing, rinsing, drying)?: Total Help from another person to put  on and taking off regular upper body clothing?: A Lot Help from another person to put on and taking off regular lower body clothing?: Total 6 Click Score: 9    End of Session    OT Visit Diagnosis: Unsteadiness on feet (R26.81);Muscle weakness (generalized) (M62.81)   Activity Tolerance Patient limited by lethargy   Patient Left in bed;with call bell/phone within reach;with bed alarm set;with family/visitor present   Nurse Communication Other (comment)(Requesting assistance for toileting)        Time: 0569-7948 OT Time Calculation (min): 39 min  Charges: OT General Charges $OT Visit: 1 Visit OT Treatments $Self Care/Home Management : 38-52 mins  Baldomero Lamy, MS, OTR/L 09/27/19, 5:05 PM

## 2019-09-27 NOTE — Progress Notes (Signed)
PROGRESS NOTE    FLAVIO LINDROTH  ZMO:[id number] DOB: September 25, 1922 DOA: 09/24/2019 PCP: Adin Hector, MD    Brief Narrative:  ARMSTRONG CREASY is a 86 y.o. male with medical history significant for prostate cancer s/p radiation therapy with chronic hydronephrosis with bilateral nephrostomy tube, sick sinus syndrome s/p pacemaker, CKD stage 4, OSA not on CPAP, history of seizure from cefepime, AAA, atrial fibrillation not on anticoagulation, history of SVT, hypertension and history of GI bleed who presents with concerns of altered mental status.    Consultants:     Procedures:   Antimicrobials:   ceftzidine   Subjective: Pt is awake and alert this AM.  Interactive with me.  Denies any pain, shortness of breath, or any other symptoms.  Objective: Vitals:   09/25/19 2320 09/26/19 0812 09/26/19 1610 09/26/19 2203  BP: 126/78 (!) 150/98 133/87 (!) 134/92  Pulse: 86 76 73 70  Resp: 16 18 18 16   Temp: (!) 97.5 F (36.4 C) 97.8 F (36.6 C) 98.4 F (36.9 C) 97.9 F (36.6 C)  TempSrc: Oral Oral Oral   SpO2: 98% 100% 96% 99%  Weight:      Height:        Intake/Output Summary (Last 24 hours) at 09/27/2019 1348 Last data filed at 09/27/2019 0508 Gross per 24 hour  Intake 415 ml  Output 1850 ml  Net -1435 ml   Filed Weights   09/24/19 1437  Weight: 97.5 kg    Examination:  General exam: Appears calm and comfortable, NAD Respiratory system: Clear to auscultation. Respiratory effort normal.  No wheezing Rales Cardiovascular system: S1 & S2 heard, RRR. No murmurs, rubs, gallops or clicks.  Gastrointestinal system: Abdomen is nondistended, soft and nontender. Normal bowel sounds heard. Central nervous system: Alert oriented x3  extremities: No edema Skin: Warm dry Psychiatry: Mood and judgment stable for current setting    Data Reviewed: I have personally reviewed following labs and imaging studies  CBC: Recent Labs  Lab 09/24/19 1530 09/25/19 0448  09/26/19 0539  WBC 6.1 5.3 5.3  NEUTROABS 4.9  --   --   HGB 9.6* 10.2* 9.9*  HCT 31.2* 32.1* 31.4*  MCV 86.7 85.4 84.2  PLT 93* 95* 94*   Basic Metabolic Panel: Recent Labs  Lab 09/24/19 1530 09/25/19 0448 09/26/19 0539  NA 134* 140 141  K 3.3* 3.5 4.0  CL 93* 99 98  CO2 27 30 29   GLUCOSE 156* 171* 160*  BUN 93* 88* 96*  CREATININE 4.15* 3.89* 3.71*  CALCIUM 7.9* 8.3* 8.5*   GFR: Estimated Creatinine Clearance: 14.2 mL/min (A) (by C-G formula based on SCr of 3.71 mg/dL (H)). Liver Function Tests: Recent Labs  Lab 09/24/19 1530  AST 18  ALT 17  ALKPHOS 86  BILITOT 0.8  PROT 7.1  ALBUMIN 2.7*   No results for input(s): LIPASE, AMYLASE in the last 168 hours. No results for input(s): AMMONIA in the last 168 hours. Coagulation Profile: No results for input(s): INR, PROTIME in the last 168 hours. Cardiac Enzymes: No results for input(s): CKTOTAL, CKMB, CKMBINDEX, TROPONINI in the last 168 hours. BNP (last 3 results) No results for input(s): PROBNP in the last 8760 hours. HbA1C: No results for input(s): HGBA1C in the last 72 hours. CBG: No results for input(s): GLUCAP in the last 168 hours. Lipid Profile: No results for input(s): CHOL, HDL, LDLCALC, TRIG, CHOLHDL, LDLDIRECT in the last 72 hours. Thyroid Function Tests: No results for input(s): TSH, T4TOTAL, FREET4, T3FREE,  THYROIDAB in the last 72 hours. Anemia Panel: No results for input(s): VITAMINB12, FOLATE, FERRITIN, TIBC, IRON, RETICCTPCT in the last 72 hours. Sepsis Labs: Recent Labs  Lab 09/24/19 1530  LATICACIDVEN 1.7    Recent Results (from the past 240 hour(s))  Blood culture (routine x 2)     Status: Abnormal   Collection Time: 09/24/19  3:16 PM   Specimen: BLOOD  Result Value Ref Range Status   Specimen Description   Final    BLOOD BLOOD RIGHT ARM Performed at [facility], [address], [place], [place] [address]    Special Requests   Final    BOTTLES DRAWN AEROBIC AND  ANAEROBIC Blood Culture adequate volume Performed at [facility], [address] [place], [place] [address]    Culture  Setup Time   Final    ANAEROBIC BOTTLE ONLY GRAM POSITIVE COCCI CRITICAL RESULT CALLED TO, READ BACK BY AND VERIFIED WITH: CHRISTINE KATSOUDAS AT 2353 ON 09/25/2019 [place]. Performed at [facility], [address] [place], [place] [address]    Culture (A)  Final    STAPHYLOCOCCUS SPECIES (COAGULASE NEGATIVE) THE SIGNIFICANCE OF ISOLATING THIS ORGANISM FROM A SINGLE SET OF BLOOD CULTURES WHEN MULTIPLE SETS ARE DRAWN IS UNCERTAIN. PLEASE NOTIFY THE MICROBIOLOGY DEPARTMENT WITHIN ONE WEEK IF SPECIATION AND SENSITIVITIES ARE REQUIRED. Performed at [facility], [place] [address] [place], [place] [address]    Report Status 09/27/2019 FINAL  Final  Blood culture (routine x 2)     Status: None (Preliminary result)   Collection Time: 09/24/19  3:30 PM   Specimen: BLOOD  Result Value Ref Range Status   Specimen Description BLOOD BLOOD RIGHT WRIST  Final   Special Requests   Final    BOTTLES DRAWN AEROBIC AND ANAEROBIC Blood Culture adequate volume   Culture   Final    NO GROWTH 3 DAYS Performed at [facility], [address], [place], [place] [address]    Report Status PENDING  Incomplete  Urine Culture     Status: Abnormal   Collection Time: 09/24/19  3:30 PM   Specimen: Urine, Random  Result Value Ref Range Status   Specimen Description   Final    URINE, RANDOM Performed at [facility], [address] [place], [place] [address]    Special Requests   Final    NONE Performed at [facility], [place], [place] [address]    Culture (A)  Final    >=100,000 COLONIES/mL METHICILLIN RESISTANT STAPHYLOCOCCUS AUREUS   Report Status 09/26/2019 FINAL  Final   Organism ID, Bacteria METHICILLIN RESISTANT STAPHYLOCOCCUS AUREUS (A)  Final      Susceptibility   Methicillin resistant staphylococcus aureus -  MIC*    CIPROFLOXACIN >=8 RESISTANT Resistant     GENTAMICIN <=0.5 SENSITIVE Sensitive     NITROFURANTOIN <=16 SENSITIVE Sensitive     OXACILLIN >=4 RESISTANT Resistant     TETRACYCLINE >=16 RESISTANT Resistant     VANCOMYCIN <=0.5 SENSITIVE Sensitive     TRIMETH/SULFA <=10 SENSITIVE Sensitive     CLINDAMYCIN >=8 RESISTANT Resistant     RIFAMPIN <=0.5 SENSITIVE Sensitive     Inducible Clindamycin NEGATIVE Sensitive     * >=100,000 COLONIES/mL METHICILLIN RESISTANT STAPHYLOCOCCUS AUREUS  C difficile quick scan w PCR reflex     Status: None   Collection Time: 09/24/19  4:00 PM   Specimen: STOOL  Result Value Ref Range Status   C Diff antigen NEGATIVE  NEGATIVE Final   C Diff toxin NEGATIVE NEGATIVE Final   C Diff interpretation No C. difficile detected.  Final    Comment: Performed at [facility], [place], [place] [address]  SARS CORONAVIRUS 2 (TAT 6-24 HRS) Nasopharyngeal Nasopharyngeal Swab     Status: None   Collection Time: 09/24/19  6:52 PM   Specimen: Nasopharyngeal Swab  Result Value Ref Range Status   SARS Coronavirus 2 NEGATIVE NEGATIVE Final    Comment: (NOTE) SARS-CoV-2 target nucleic acids are NOT DETECTED. The SARS-CoV-2 RNA is generally detectable in upper and lower respiratory specimens during the acute phase of infection. Negative results do not preclude SARS-CoV-2 infection, do not rule out co-infections with other pathogens, and should not be used as the sole basis for treatment or other patient management decisions. Negative results must be combined with clinical observations, patient history, and epidemiological information. The expected result is Negative. Fact Sheet for Patients: SugarRoll.be Fact Sheet for Healthcare Providers: https://www.woods-mathews.com/ This test is not yet approved or cleared by the [place] FDA and  has been authorized for detection and/or diagnosis of SARS-CoV-2  by FDA under an Emergency Use Authorization (EUA). This EUA will remain  in effect (meaning this test can be used) for the duration of the COVID-19 declaration under Section 56 4(b)(1) of the Act, 21 U.S.C. section 360bbb-3(b)(1), unless the authorization is terminated or revoked sooner. Performed at [facility], [place] [address] [place], [place] [address]   MRSA PCR Screening     Status: Abnormal   Collection Time: 09/26/19  3:24 PM   Specimen: Nasal Mucosa; Nasopharyngeal  Result Value Ref Range Status   MRSA by PCR POSITIVE (A) NEGATIVE Final    Comment:        The GeneXpert MRSA Assay (FDA approved for NASAL specimens only), is one component of a comprehensive MRSA colonization surveillance program. It is not intended to diagnose MRSA infection nor to guide or monitor treatment for MRSA infections. RESULT CALLED TO, READ BACK BY AND VERIFIED WITH: WANETTE MILES @1738  09/26/19 MJU Performed at [facility], [address] [place], [place] [address]          Radiology Studies: No results found.      Scheduled Meds: . Chlorhexidine Gluconate Cloth  6 each Topical Daily  . diclofenac Sodium  2 g Topical QID  . feeding supplement (GLUCERNA SHAKE)  237 mL Oral TID BM  . finasteride  5 mg Oral Daily  . metoprolol tartrate  37.5 mg Oral BID  . multivitamin with minerals  1 tablet Oral Daily  . mupirocin ointment   Nasal BID  . pantoprazole  40 mg Oral Daily  . rosuvastatin  5 mg Oral q1800  . sodium bicarbonate  650 mg Oral BID  . sulfamethoxazole-trimethoprim  1 tablet Oral Daily  . tamsulosin  0.4 mg Oral Daily  . torsemide  40 mg Oral BID   Continuous Infusions:   Assessment & Plan:   Principal Problem:   AMS (altered mental status) Active Problems:   Benign hypertension   A-fib (HCC)   AAA (abdominal aortic aneurysm) without rupture (HCC)   Pressure ulcer, stage II (HCC)   Thrombocytopenia, unspecified (HCC)   CKD (chronic kidney  disease) stage 4, GFR 15-29 ml/min (HCC)   Anemia   OSA (obstructive sleep apnea)  Acute metabolic encephalopathy: likely secondary to UTI in the setting of chronic hydronephrosis with bilateral nephrostomy tubes.  Now encephalopathy has resolved ucx  with MRSA, pt with ckd, with multiple allergies.  Eyesight line Dr. Ola Spurr infectious disease after reviewing medications and urine culture he recommended Bactrim 1 DS daily (side effects were reviewed). Unlikely bacteremia: 1 of 2 growing gram positive cocci in anaerobic bottle only. Likely a containment,   Chronic hydronephrosis: with bilateral nephrostomy tube. Continue proscar and flomax   Thrombocytopenia: etiology unclear. No need for a transfusion at this time. no signs of bleed Will continue to monitor   Stage 2 Pressure ulcer wound: on left buttock. Wound care consulted  Right heel ulcer: stage I. Wound care consulted   Abdominal aortic aneurysm: stable at 5.3 cm. Will need CTA abdomen pelvis follow-up in 3 to 6 months outpatient  Chronic anemia: no need for a transfusion at this time. Will continue to monitor   PAF: not on anticoagulation. Continue metoprolol  CKDIV: stable. Continue sodium bicarbonate. Will continue to monitor   Hypertension: continue metoprolol and torsemide  OSA: not on CPAP  Generalized weakness: PT /OT recs home health    DVT prophylaxis: scd Code Status:  Full  family Communication: None at bedside Disposition Plan: Will return home  Barrier: Mental status needs more improvement, started on PO abx need to see if tolerates today.then possible d/c in am.        LOS: 3 days   Time spent: 45 minutes with more than 50% on [place], MD [facility] Pager [phone]  If 7PM-7AM, please contact night-coverage [email or web address] Password [facility] 09/27/2019, 1:48 PM Patient ID: [name], male   DOB: 1922/09/12, 86 y.o.   MRN: [id number]

## 2019-09-28 LAB — CBC
HCT: 31 % — ABNORMAL LOW (ref 39.0–52.0)
Hemoglobin: 9.9 g/dL — ABNORMAL LOW (ref 13.0–17.0)
MCH: 27 pg (ref 26.0–34.0)
MCHC: 31.9 g/dL (ref 30.0–36.0)
MCV: 84.7 fL (ref 80.0–100.0)
Platelets: 94 10*3/uL — ABNORMAL LOW (ref 150–400)
RBC: 3.66 MIL/uL — ABNORMAL LOW (ref 4.22–5.81)
RDW: 17.6 % — ABNORMAL HIGH (ref 11.5–15.5)
WBC: 6.5 10*3/uL (ref 4.0–10.5)
nRBC: 0 % (ref 0.0–0.2)

## 2019-09-28 LAB — BASIC METABOLIC PANEL
Anion gap: 12 (ref 5–15)
BUN: 95 mg/dL — ABNORMAL HIGH (ref 8–23)
CO2: 29 mmol/L (ref 22–32)
Calcium: 8.6 mg/dL — ABNORMAL LOW (ref 8.9–10.3)
Chloride: 96 mmol/L — ABNORMAL LOW (ref 98–111)
Creatinine, Ser: 3.7 mg/dL — ABNORMAL HIGH (ref 0.61–1.24)
GFR calc Af Amer: 15 mL/min — ABNORMAL LOW (ref >60)
GFR calc non Af Amer: 13 mL/min — ABNORMAL LOW (ref >60)
Glucose, Bld: 194 mg/dL — ABNORMAL HIGH (ref 70–99)
Potassium: 4 mmol/L (ref 3.5–5.1)
Sodium: 137 mmol/L (ref 135–145)

## 2019-09-28 LAB — OCCULT BLOOD X 1 CARD TO LAB, STOOL: Fecal Occult Bld: NEGATIVE

## 2019-09-28 MED ORDER — SULFAMETHOXAZOLE-TRIMETHOPRIM 800-160 MG PO TABS: 1.0000 | ORAL_TABLET | Freq: Every day | ORAL | 0 refills | Status: AC

## 2019-09-28 NOTE — Discharge Summary (Signed)
CONSTANTIN HILLERY TDV:[id number] DOB: 10-Aug-1922 DOA: 09/24/2019  PCP: Adin Hector, MD  Admit date: 09/24/2019 Discharge date: 09/28/2019  Admitted From: home Disposition:  home  Recommendations for Outpatient Follow-up:  1. Follow up with PCP in 1 week 2. Please obtain BMP/CBC in one week 3. F/u with urology  Home Health:PT/OT   Discharge Condition:Stable CODE STATUS:full Diet recommendation: Dysphagia 2  Brief/Interim Summary: BERNHARD KOSKINEN is a 86 y.o. male with medical history significant for prostate cancer s/p radiation therapy with chronic hydronephrosis with bilateral nephrostomy tube, sick sinus syndrome s/p pacemaker, CKD stage 4, OSA not on CPAP, history of seizure from cefepime, AAA, atrial fibrillation not on anticoagulation, history of SVT, hypertension and history of GI bleed who presents with concerns of altered mental status.  He had a CT of abdomen pelvis with results below.Found with infrarenal aneurysm too but daughter stated he is following vascular surgery already twice a year. His altered mental status was likely due to urinary tract infection in the setting of chronic hydronephrosis with bilateral nephrostomy tubes.  His encephalopathy improved.  Urine culture was positive for MRSA.  Due to multiple allergies and resistance to MRSA Dr. Ola Spurr infectious disease was sidelined and after reviewing all his medications we discharged the patient on Bactrim.  He was kept overnight to see if he tolerates the Bactrim and also making sure his mental status remained stable.  He had no reaction to the Bactrim.  His mental status had improved upon discharge.  1 out of 2 blood cultures grew gram-positive cocci in anaerobic bottle likely was a contaminant.  He did get his PT OT and they recommended home health.  He is stable to be discharged home.  Discussed all of the above with the daughter today.  She verbalizes an understanding.  Discharge Diagnoses:  Principal Problem:    AMS (altered mental status) Active Problems:   Benign hypertension   A-fib (HCC)   AAA (abdominal aortic aneurysm) without rupture (HCC)   Pressure ulcer, stage II (HCC)   Thrombocytopenia, unspecified (HCC)   CKD (chronic kidney disease) stage 4, GFR 15-29 ml/min (HCC)   Anemia   OSA (obstructive sleep apnea)    Discharge Instructions  Discharge Instructions    Call MD for:  temperature >100.4   Complete by: As directed    Diet - low sodium heart healthy   Complete by: As directed    Discharge instructions   Complete by: As directed    Follow up with pcp in 1-3 days need lab work. F/u with urologist in one week   Increase activity slowly   Complete by: As directed      Allergies as of 09/28/2019      Reactions   Cefepime    seizure   Diovan [valsartan] Cough   Gabapentin Other (See Comments)   Sedation at all doses   Hydralazine Itching   Lisinopril Cough   Pregabalin Other (See Comments)   Sedation at all doses   Levofloxacin Other (See Comments)   Too many side effects. Nausea, vomiting, upset stomach, increased confusion, etc Other reaction(s): Confusion Nausea, chills Nausea, chills   Sulfamethoxazole-trimethoprim Other (See Comments)   Too many side effects. Nausea, vomiting, upset stomach, increased confusion, etc      Medication List    STOP taking these medications   ciprofloxacin 250 MG tablet Commonly known as: CIPRO     TAKE these medications   azelastine 0.1 % nasal spray Commonly known as: ASTELIN  Place 1 spray into both nostrils 2 (two) times daily as needed for rhinitis or allergies. Notes to patient: Not given during this hospitalization   feeding supplement (GLUCERNA SHAKE) Liqd Take 237 mLs by mouth 2 (two) times daily between meals.   finasteride 5 MG tablet Commonly known as: PROSCAR Take 5 mg by mouth daily.   Metoprolol Tartrate 37.5 MG Tabs Take 37.5 mg by mouth 2 (two) times daily.   multivitamin-iron-minerals-folic acid  chewable tablet Chew 2 tablets by mouth daily. What changed: Another medication with the same name was removed. Continue taking this medication, and follow the directions you see here.   pantoprazole 40 MG tablet Commonly known as: PROTONIX Take 40 mg by mouth daily.   rosuvastatin 5 MG tablet Commonly known as: CRESTOR Take 5 mg by mouth at bedtime.   sitaGLIPtin 50 MG tablet Commonly known as: JANUVIA Take 0.5 tablets (25 mg total) by mouth daily. Notes to patient: Not given during this hospitalization   sodium bicarbonate 650 MG tablet Take 1 tablet (650 mg total) by mouth 2 (two) times daily.   sulfamethoxazole-trimethoprim 800-160 MG tablet Commonly known as: BACTRIM DS Take 1 tablet by mouth daily for 10 doses. Start taking on: September 29, 2019   tamsulosin 0.4 MG Caps capsule Commonly known as: FLOMAX Take 0.4 mg by mouth daily.   torsemide 20 MG tablet Commonly known as: DEMADEX Take 40 mg by mouth 2 (two) times daily.   VITRON-C PO Take 1 tablet by mouth daily. Notes to patient: Not given during this hospitalization      Follow-up Information    [name], MD. Schedule an appointment as soon as possible for a visit on 10/01/2019.   Specialty: Internal Medicine Why: needs bmp  Dr. Olin Pia office will call Dr. Lazarus Salines on mobile phone with an appointment  Contact information: [address] [facility] [place] [address] [phone]          Allergies  Allergen Reactions  . Cefepime     seizure  . Diovan [Valsartan] Cough  . Gabapentin Other (See Comments)    Sedation at all doses  . Hydralazine Itching  . Lisinopril Cough  . Pregabalin Other (See Comments)    Sedation at all doses  . Levofloxacin Other (See Comments)    Too many side effects. Nausea, vomiting, upset stomach, increased confusion, etc Other reaction(s): Confusion Nausea, chills Nausea, chills   . Sulfamethoxazole-Trimethoprim Other (See  Comments)    Too many side effects. Nausea, vomiting, upset stomach, increased confusion, etc    Consultations:     Procedures/Studies: CT ABDOMEN PELVIS WO CONTRAST  Result Date: 09/24/2019 CLINICAL DATA:  Hydronephrosis. Bilateral nephrostomy tubes in place. Patient presents with nausea, diarrhea and encephalopathy. EXAM: CT ABDOMEN AND PELVIS WITHOUT CONTRAST TECHNIQUE: Multidetector CT imaging of the abdomen and pelvis was performed following the standard protocol without IV contrast. COMPARISON:  08/31/2019 FINDINGS: Lower chest: Moderate right pleural effusion with overlying platelike atelectasis noted. Mild platelike atelectasis is also identified in the left lower lobe. Hepatobiliary: 8 mm low-attenuation structure in right lobe of liver is too small to characterize but appears unchanged from previous exam. The gallbladder is unremarkable. No biliary dilatation. Pancreas: Unremarkable. No pancreatic ductal dilatation or surrounding inflammatory changes. Spleen: Normal in size without focal abnormality. Adrenals/Urinary Tract: The adrenal glands appear normal. Asymmetric right renal atrophy. Bilateral percutaneous nephrostomy tubes are in place. No significant hydronephrosis identified bilaterally. There are approximately 3 small stones within  the distal left ureter which measure up to 2-3 mm. No right ureteral calculi. Urinary bladder is unremarkable. Stomach/Bowel: The stomach is nondistended. The small bowel loops have a normal caliber. Multiple colonic diverticula identified. No bowel wall thickening, inflammation or distension. Vascular/Lymphatic: Aortic atherosclerosis. Infrarenal abdominal aortic aneurysm measures 5.3 cm, image 46/2. Unchanged. No abdominopelvic adenopathy Reproductive: No mass identified. Other: No ascites or focal fluid collections. Fat containing umbilical hernia, unchanged. Musculoskeletal: Diffuse osteopenia. Stable degenerative changes throughout the visualized  thoracolumbar spine. IMPRESSION: 1. Bilateral percutaneous nephrostomy tubes are in place without significant hydronephrosis. 2. There are 3 small stones within the distal left ureter measuring up to 2-3 mm. 3. Moderate right pleural effusion with overlying platelike atelectasis. 4. Stable infrarenal abdominal aortic aneurysm measuring 5.3 cm. Recommend followup by abdomen and pelvis CTA in 3-6 months, and vascular surgery referral/consultation if not already obtained. This recommendation follows ACR consensus guidelines: White Paper of the ACR Incidental Findings Committee II on Vascular Findings. J Am Coll Radiol 2013; 10:789-794. Aortic aneurysm NOS (ICD10-I71.9). Aortic Atherosclerosis (ICD10-I70.0). Electronically Signed   By: Kerby Moors M.D.   On: 09/24/2019 17:56   CT ABDOMEN PELVIS WO CONTRAST  Result Date: 08/31/2019 CLINICAL DATA:  Patient being evaluated for lower GI bleeding. Pt c/o lower abdominal pain radiating from umbilical area to lower abdomen x 3 wks. Denies any n/v/d. Hx gi bleed, prostate ca,bilat nephrostomy with bilat nephrostomy tubes, AAA EXAM: CT ABDOMEN AND PELVIS WITHOUT CONTRAST TECHNIQUE: Multidetector CT imaging of the abdomen and pelvis was performed following the standard protocol without IV contrast. COMPARISON:  01/01/2019 FINDINGS: Lower chest: Small to moderate right pleural effusion. Trace left pleural fluid. There is dependent opacity in the lower lobes consistent with atelectasis. Consider pneumonia if there are consistent clinical findings. Pleural fluid and lung base opacity have increased from the prior CT. Hepatobiliary: Liver normal in size and attenuation. 6 mm low-density lesion in the right lobe, likely a cyst, stable. No other liver masses or lesions. Gallbladder wall appears mildly thickened, but gallbladder is also mostly decompressed. No gallstones or adjacent inflammation. No bile duct dilation. Pancreas: Unremarkable. No pancreatic ductal dilatation or  surrounding inflammatory changes. Spleen: Normal in size without focal abnormality. Adrenals/Urinary Tract: No adrenal masses. Bilateral percutaneous nephrostomy tubes. On the right, tube curls in the renal pelvis. On the left, tube curls and a posterior midpole calyx, retracted compared to the prior CT where it resided in the renal pelvis. Diffuse right renal cortical thinning. No renal masses. No intrarenal stones. Mild left renal pelvis dilation without caliectasis. No dilation of the right renal collecting system. Ureters are normal in course and in caliber. 2 small, 1-2 mm, stones project within the distal left ureter approximately 2 cm above the ureterovesicular junction, new since the prior CT patient had 2 small lower pole stones within the kidney on the prior CT, presumably representing the distal ureteral stones. No other ureteral stones. Bladder is minimally distended. Wall is prominent likely due to the lack of distension. No bladder mass or stone. Stomach/Bowel: Numerous colonic diverticula. Wall thickening or pericolonic inflammation to suggest diverticulitis or other inflammatory process. Stomach and small bowel are unremarkable. Vascular/Lymphatic: Saccular infrarenal abdominal aortic aneurysm measuring 5.3 cm anterior to posterior, unchanged from the prior CT. Diffuse aortic and branch vessel atherosclerotic calcifications. No enlarged lymph nodes. Reproductive: No prostate enlargement. Other: Small fat containing umbilical hernia.  No ascites. Musculoskeletal: No fracture or acute finding. No osteoblastic or osteolytic lesions. Bones are diffusely demineralized and  there are stable degenerative changes noted along the visualized spine. IMPRESSION: 1. Two, small, 1-2 mm, stones project in the distal left ureter. There is mild dilation of the left renal pelvis without caliectasis. 2. Percutaneous left nephrostomy tube has retracted, the curled end remaining within the collecting system, within a  posterior midpole renal calyx. 3. No other evidence of an acute abnormality within the abdomen or pelvis. 4. Small to moderate right and minimal left pleural effusions associated with dependent lung base opacity, the latter finding likely atelectasis. Consider pneumonia if there are consistent clinical findings. Pleural fluid and lung base opacity has increased compared to the prior CT. 5. Numerous colonic diverticula without evidence of diverticulitis. 6. Saccular infrarenal abdominal aortic aneurysm measuring 5.3 cm. No change from the prior CT. Recommend followup by abdomen and pelvis CTA in 3-6 months, and vascular surgery referral/consultation if not already obtained. This recommendation follows ACR consensus guidelines: White Paper of the ACR Incidental Findings Committee II on Vascular Findings. J Am Coll Radiol 2013; 10:789-794. Aortic aneurysm NOS (ICD10-I71.9) Electronically Signed   By: Lajean Manes M.D.   On: 08/31/2019 10:38   US Venous Img Lower Unilateral Right  Result Date: 09/24/2019 CLINICAL DATA:  Right lower extremity swelling for weeks. EXAM: RIGHT LOWER EXTREMITY VENOUS DOPPLER ULTRASOUND TECHNIQUE: Gray-scale sonography with compression, as well as color and duplex ultrasound, were performed to evaluate the deep venous system(s) from the level of the common femoral vein through the popliteal and proximal calf veins. COMPARISON:  None. FINDINGS: VENOUS Normal compressibility of the common femoral, superficial femoral, and popliteal veins, as well as the visualized calf veins. Visualized portions of profunda femoral vein and great saphenous vein unremarkable. No filling defects to suggest DVT on grayscale or color Doppler imaging. Doppler waveforms show normal direction of venous flow, normal respiratory phasicity and response to augmentation. Limited views of the contralateral common femoral vein are unremarkable. OTHER Soft tissue edema noted in the calf. Limitations: none IMPRESSION: 1.  No evidence of right lower extremity DVT. 2. Mild soft tissue edema in the calf. Electronically Signed   By: Keith Rake M.D.   On: 09/24/2019 18:36   DG Chest Portable 1 View  Result Date: 09/24/2019 CLINICAL DATA:  Weakness. EXAM: PORTABLE CHEST 1 VIEW COMPARISON:  January 08, 2019. FINDINGS: Stable cardiomegaly. Atherosclerosis of thoracic aorta is noted. Left-sided pacemaker is unchanged in position. No pneumothorax is noted. Left lung is clear. Mild right basilar atelectasis or infiltrate is noted with possible small right pleural effusion. Bony thorax is unremarkable. IMPRESSION: Mild right basilar atelectasis or infiltrate is noted with small right pleural effusion. Aortic Atherosclerosis (ICD10-I70.0). Electronically Signed   By: Marijo Conception M.D.   On: 09/24/2019 15:04   DG Knee Right Port  Result Date: 09/24/2019 CLINICAL DATA:  Right knee pain. Swelling. EXAM: PORTABLE RIGHT KNEE - 1-2 VIEW COMPARISON:  None. FINDINGS: Tricompartmental peripheral spurring. Narrowing of the medial tibiofemoral joint space. No erosion, periosteal reaction, or bony destruction. Trace knee joint effusion. Mild generalized edema. There are vascular calcifications. IMPRESSION: 1. Tricompartmental osteoarthritis.  No acute osseous abnormality. 2. Trace knee joint effusion with generalized soft tissue edema. Electronically Signed   By: Keith Rake M.D.   On: 09/24/2019 18:20   IR NEPHROSTOMY EXCHANGE LEFT  Result Date: 09/19/2019 INDICATION: History of prostate cancer with chronic bilateral indwelling nephrostomy catheters. Unfortunately, the patient's left-sided nephrostomy catheter was inadvertently removed. As such, patient presents today for urgent fluoroscopic guided replacement. As patient is  due for routine exchange in 2 weeks, the contralateral right-sided nephrostomy catheter will also be exchanged. EXAM: 1. IMAGE GUIDED REPLACEMENT OF CHRONIC LEFT-SIDED NEPHROSTOMY CATHETER. 2. FLUOROSCOPIC GUIDED  EXCHANGE OF CHRONIC RIGHT-SIDED NEPHROSTOMY CATHETER COMPARISON:  CT abdomen and pelvis-08/21/2019 CONTRAST:  15 cc Omnipaque 300 - administered was administered into both collecting systems FLUOROSCOPY TIME:  1 minutes, 42 seconds (89 mGy) COMPLICATIONS: None immediate. TECHNIQUE: Informed written consent was obtained from the patient's son after a discussion of the risks, benefits and alternatives to treatment. Questions regarding the procedure were encouraged and answered. A timeout was performed prior to the initiation of the procedure. The bilateral flanks and external portion of the existing right-sided nephrostomy catheter was prepped and draped in the usual sterile fashion. A sterile drape was applied covering the operative field. Maximum barrier sterile technique with sterile gowns and gloves were used for the procedure. A timeout was performed prior to the initiation of the procedure. A pre procedural spot fluoroscopic image was obtained. The site of the prior left-sided nephrostomy was marked fluoroscopically. The entrance site was cannulated with a Christmas tree adapter and contrast injection demonstrated persistent patency of the tract to the level of the right renal collecting system. Next, with the use of a regular glidewire, a Kumpe catheter was advanced through the chronic nephrostomy tract to the level of the right renal pelvis. Contrast injection confirmed appropriate positioning Next, over a short Amplatz wire, the Kumpe catheter was exchanged for a new 12 French nephrostomy catheter with end coiled and locked within left renal pelvis. Limited contrast injection was performed demonstrating appropriate positioning and functionality. A postprocedural spot fluoroscopic image was obtained. Attention was now paid towards the right-sided nephrostomy catheter. A small amount of contrast was injected via the existing right-sided nephrostomy catheter demonstrating appropriate positioning within the renal  pelvis. The existing nephrostomy catheter was cut and cannulated with a short Amplatz wire which was coiled within the renal pelvis. Under intermittent fluoroscopic guidance, the existing nephrostomy catheter was exchanged for a new 12 [place] all-purpose drainage catheter. Limited contrast injection confirmed appropriate positioning within the left renal pelvis and a post exchange fluoroscopic image was obtained. Both nephrostomy catheters were connected to gravity bag and secured in place with interrupted sutures and StatLock devices. Dressings were applied. The patient tolerated the above procedures well without immediate postprocedural complication. FINDINGS: Contrast injection demonstrates patency of the chronic left-sided nephrostomy catheter track. After fluoroscopic guided recanalization, the new left-sided nephrostomy catheter is appropriately positioned with end coiled and locked within the left renal pelvis. Contrast injection demonstrates appropriate positioning and functionality of the right-sided nephrostomy catheter. After fluoroscopic guided exchange, the new 12 French nephrostomy catheter is appropriately positioned with end coiled and locked within the right renal pelvis. IMPRESSION: 1. Successful fluoroscopic guided recanalization of chronic left-sided nephrostomy catheter track with placement of a new 13 French PCN with end coiled and locked left renal pelvis. 2. Successful fluoroscopic guided exchange of chronic right-sided 12 French percutaneous nephrostomy catheter, Electronically Signed   By: Sandi Mariscal M.D.   On: 09/19/2019 13:15   IR NEPHROSTOMY EXCHANGE RIGHT  Result Date: 09/19/2019 INDICATION: History of prostate cancer with chronic bilateral indwelling nephrostomy catheters. Unfortunately, the patient's left-sided nephrostomy catheter was inadvertently removed. As such, patient presents today for urgent fluoroscopic guided replacement. As patient is due for routine exchange in 2  weeks, the contralateral right-sided nephrostomy catheter will also be exchanged. EXAM: 1. IMAGE GUIDED REPLACEMENT OF CHRONIC LEFT-SIDED NEPHROSTOMY CATHETER. 2. FLUOROSCOPIC GUIDED  EXCHANGE OF CHRONIC RIGHT-SIDED NEPHROSTOMY CATHETER COMPARISON:  CT abdomen and pelvis-08/21/2019 CONTRAST:  15 cc Omnipaque 300 - administered was administered into both collecting systems FLUOROSCOPY TIME:  1 minutes, 42 seconds (89 mGy) COMPLICATIONS: None immediate. TECHNIQUE: Informed written consent was obtained from the patient's son after a discussion of the risks, benefits and alternatives to treatment. Questions regarding the procedure were encouraged and answered. A timeout was performed prior to the initiation of the procedure. The bilateral flanks and external portion of the existing right-sided nephrostomy catheter was prepped and draped in the usual sterile fashion. A sterile drape was applied covering the operative field. Maximum barrier sterile technique with sterile gowns and gloves were used for the procedure. A timeout was performed prior to the initiation of the procedure. A pre procedural spot fluoroscopic image was obtained. The site of the prior left-sided nephrostomy was marked fluoroscopically. The entrance site was cannulated with a Christmas tree adapter and contrast injection demonstrated persistent patency of the tract to the level of the right renal collecting system. Next, with the use of a regular glidewire, a Kumpe catheter was advanced through the chronic nephrostomy tract to the level of the right renal pelvis. Contrast injection confirmed appropriate positioning Next, over a short Amplatz wire, the Kumpe catheter was exchanged for a new 12 French nephrostomy catheter with end coiled and locked within left renal pelvis. Limited contrast injection was performed demonstrating appropriate positioning and functionality. A postprocedural spot fluoroscopic image was obtained. Attention was now paid towards  the right-sided nephrostomy catheter. A small amount of contrast was injected via the existing right-sided nephrostomy catheter demonstrating appropriate positioning within the renal pelvis. The existing nephrostomy catheter was cut and cannulated with a short Amplatz wire which was coiled within the renal pelvis. Under intermittent fluoroscopic guidance, the existing nephrostomy catheter was exchanged for a new 12 [place] all-purpose drainage catheter. Limited contrast injection confirmed appropriate positioning within the left renal pelvis and a post exchange fluoroscopic image was obtained. Both nephrostomy catheters were connected to gravity bag and secured in place with interrupted sutures and StatLock devices. Dressings were applied. The patient tolerated the above procedures well without immediate postprocedural complication. FINDINGS: Contrast injection demonstrates patency of the chronic left-sided nephrostomy catheter track. After fluoroscopic guided recanalization, the new left-sided nephrostomy catheter is appropriately positioned with end coiled and locked within the left renal pelvis. Contrast injection demonstrates appropriate positioning and functionality of the right-sided nephrostomy catheter. After fluoroscopic guided exchange, the new 12 French nephrostomy catheter is appropriately positioned with end coiled and locked within the right renal pelvis. IMPRESSION: 1. Successful fluoroscopic guided recanalization of chronic left-sided nephrostomy catheter track with placement of a new 44 French PCN with end coiled and locked left renal pelvis. 2. Successful fluoroscopic guided exchange of chronic right-sided 12 French percutaneous nephrostomy catheter, Electronically Signed   By: Sandi Mariscal M.D.   On: 09/19/2019 13:15       Subjective: Feeling better has no complaints Discharge Exam: Vitals:   09/28/19 0111 09/28/19 0832  BP: 139/87 (!) 139/91  Pulse: 95 79  Resp: 16 18  Temp: 97.9 F  (36.6 C) 98.1 F (36.7 C)  SpO2: 97% 95%   Vitals:   09/26/19 2203 09/27/19 2157 09/28/19 0111 09/28/19 0832  BP: (!) 134/92 138/82 139/87 (!) 139/91  Pulse: 70 71 95 79  Resp: 16  16 18   Temp: 97.9 F (36.6 C)  97.9 F (36.6 C) 98.1 F (36.7 C)  TempSrc:    Oral  SpO2: 99%  97% 95%  Weight:      Height:        General: Pt is alert, awake x3, not in acute distress Cardiovascular: RRR, S1/S2 +, no rubs, no gallops Respiratory: CTA bilaterally, no wheezing, no rhonchi Abdominal: Soft, NT, ND, bowel sounds + Extremities: no edema, no cyanosis    The results of significant diagnostics from this hospitalization (including imaging, microbiology, ancillary and laboratory) are listed below for reference.     Microbiology: Recent Results (from the past 240 hour(s))  Blood culture (routine x 2)     Status: Abnormal   Collection Time: 09/24/19  3:16 PM   Specimen: BLOOD  Result Value Ref Range Status   Specimen Description   Final    BLOOD BLOOD RIGHT ARM Performed at [facility], [address], [place], [place] [address]    Special Requests   Final    BOTTLES DRAWN AEROBIC AND ANAEROBIC Blood Culture adequate volume Performed at [facility], [address] [place], [place] [address]    Culture  Setup Time   Final    ANAEROBIC BOTTLE ONLY GRAM POSITIVE COCCI CRITICAL RESULT CALLED TO, READ BACK BY AND VERIFIED WITH: CHRISTINE KATSOUDAS AT 8413 ON 09/25/2019 [place]. Performed at [facility], [address] [place], [place] [address]    Culture (A)  Final    STAPHYLOCOCCUS SPECIES (COAGULASE NEGATIVE) THE SIGNIFICANCE OF ISOLATING THIS ORGANISM FROM A SINGLE SET OF BLOOD CULTURES WHEN MULTIPLE SETS ARE DRAWN IS UNCERTAIN. PLEASE NOTIFY THE MICROBIOLOGY DEPARTMENT WITHIN ONE WEEK IF SPECIATION AND SENSITIVITIES ARE REQUIRED. Performed at [facility], [place] [address], [place], [place] [address]    Report Status 09/27/2019 FINAL  Final   Blood culture (routine x 2)     Status: None (Preliminary result)   Collection Time: 09/24/19  3:30 PM   Specimen: BLOOD  Result Value Ref Range Status   Specimen Description BLOOD BLOOD RIGHT WRIST  Final   Special Requests   Final    BOTTLES DRAWN AEROBIC AND ANAEROBIC Blood Culture adequate volume   Culture   Final    NO GROWTH 4 DAYS Performed at [facility], [address], [place], [place] [address]    Report Status PENDING  Incomplete  Urine Culture     Status: Abnormal   Collection Time: 09/24/19  3:30 PM   Specimen: Urine, Random  Result Value Ref Range Status   Specimen Description   Final    URINE, RANDOM Performed at [facility], [address] [place], [place] [address]    Special Requests   Final    NONE Performed at [facility], [address], [place], [place] [address]    Culture (A)  Final    >=100,000 COLONIES/mL METHICILLIN RESISTANT STAPHYLOCOCCUS AUREUS   Report Status 09/26/2019 FINAL  Final   Organism ID, Bacteria METHICILLIN RESISTANT STAPHYLOCOCCUS AUREUS (A)  Final      Susceptibility   Methicillin resistant staphylococcus aureus - MIC*    CIPROFLOXACIN >=8 RESISTANT Resistant     GENTAMICIN <=0.5 SENSITIVE Sensitive     NITROFURANTOIN <=16 SENSITIVE Sensitive     OXACILLIN >=4 RESISTANT Resistant     TETRACYCLINE >=16 RESISTANT Resistant     VANCOMYCIN <=0.5 SENSITIVE Sensitive     TRIMETH/SULFA <=10 SENSITIVE Sensitive     CLINDAMYCIN >=8 RESISTANT Resistant     RIFAMPIN <=0.5 SENSITIVE Sensitive     Inducible Clindamycin NEGATIVE Sensitive     * >=  100,000 COLONIES/mL METHICILLIN RESISTANT STAPHYLOCOCCUS AUREUS  C difficile quick scan w PCR reflex     Status: None   Collection Time: 09/24/19  4:00 PM   Specimen: STOOL  Result Value Ref Range Status   C Diff antigen NEGATIVE NEGATIVE Final   C Diff toxin NEGATIVE NEGATIVE Final   C Diff interpretation No C. difficile detected.  Final    Comment: Performed  at [facility], [place], [place] [address]  SARS CORONAVIRUS 2 (TAT 6-24 HRS) Nasopharyngeal Nasopharyngeal Swab     Status: None   Collection Time: 09/24/19  6:52 PM   Specimen: Nasopharyngeal Swab  Result Value Ref Range Status   SARS Coronavirus 2 NEGATIVE NEGATIVE Final    Comment: (NOTE) SARS-CoV-2 target nucleic acids are NOT DETECTED. The SARS-CoV-2 RNA is generally detectable in upper and lower respiratory specimens during the acute phase of infection. Negative results do not preclude SARS-CoV-2 infection, do not rule out co-infections with other pathogens, and should not be used as the sole basis for treatment or other patient management decisions. Negative results must be combined with clinical observations, patient history, and epidemiological information. The expected result is Negative. Fact Sheet for Patients: SugarRoll.be Fact Sheet for Healthcare Providers: https://www.woods-mathews.com/ This test is not yet approved or cleared by the [place] FDA and  has been authorized for detection and/or diagnosis of SARS-CoV-2 by FDA under an Emergency Use Authorization (EUA). This EUA will remain  in effect (meaning this test can be used) for the duration of the COVID-19 declaration under Section 56 4(b)(1) of the Act, 21 U.S.C. section 360bbb-3(b)(1), unless the authorization is terminated or revoked sooner. Performed at [facility], [place] [address], [place], [place] [address]   MRSA PCR Screening     Status: Abnormal   Collection Time: 09/26/19  3:24 PM   Specimen: Nasal Mucosa; Nasopharyngeal  Result Value Ref Range Status   MRSA by PCR POSITIVE (A) NEGATIVE Final    Comment:        The GeneXpert MRSA Assay (FDA approved for NASAL specimens only), is one component of a comprehensive MRSA colonization surveillance program. It is not intended to diagnose MRSA infection nor to guide  or monitor treatment for MRSA infections. RESULT CALLED TO, READ BACK BY AND VERIFIED WITH: WANETTE MILES @1738  09/26/19 MJU Performed at [facility], [address] [place], [place] [address]      Labs: BNP (last 3 results) Recent Labs    11/21/18 1948 12/25/18 0615 09/24/19 1530  BNP 957.0* 1,226.0* 503.5*   Basic Metabolic Panel: Recent Labs  Lab 09/24/19 1530 09/25/19 0448 09/26/19 0539 09/28/19 0458  NA 134* 140 141 137  K 3.3* 3.5 4.0 4.0  CL 93* 99 98 96*  CO2 27 30 29 29   GLUCOSE 156* 171* 160* 194*  BUN 93* 88* 96* 95*  CREATININE 4.15* 3.89* 3.71* 3.70*  CALCIUM 7.9* 8.3* 8.5* 8.6*   Liver Function Tests: Recent Labs  Lab 09/24/19 1530  AST 18  ALT 17  ALKPHOS 86  BILITOT 0.8  PROT 7.1  ALBUMIN 2.7*   No results for input(s): LIPASE, AMYLASE in the last 168 hours. No results for input(s): AMMONIA in the last 168 hours. CBC: Recent Labs  Lab 09/24/19 1530 09/25/19 0448 09/26/19 0539 09/28/19 0458  WBC 6.1 5.3 5.3 6.5  NEUTROABS 4.9  --   --   --   HGB 9.6* 10.2* 9.9* 9.9*  HCT 31.2* 32.1* 31.4* 31.0*  MCV 86.7 85.4 84.2 84.7  PLT 93* 95* 94* 94*   Cardiac Enzymes: No results for input(s): CKTOTAL, CKMB, CKMBINDEX, TROPONINI in the last 168 hours. BNP: Invalid input(s): POCBNP CBG: No results for input(s): GLUCAP in the last 168 hours. D-Dimer No results for input(s): DDIMER in the last 72 hours. Hgb A1c No results for input(s): HGBA1C in the last 72 hours. Lipid Profile No results for input(s): CHOL, HDL, LDLCALC, TRIG, CHOLHDL, LDLDIRECT in the last 72 hours. Thyroid function studies No results for input(s): TSH, T4TOTAL, T3FREE, THYROIDAB in the last 72 hours.  Invalid input(s): FREET3 Anemia work up No results for input(s): VITAMINB12, FOLATE, FERRITIN, TIBC, IRON, RETICCTPCT in the last 72 hours. Urinalysis    Component Value Date/Time   COLORURINE YELLOW (A) 09/24/2019 1530   APPEARANCEUR HAZY (A) 09/24/2019  1530   APPEARANCEUR Cloudy (A) 03/05/2019 1629   LABSPEC 1.011 09/24/2019 1530   LABSPEC 1.029 12/29/2012 1554   PHURINE 6.0 09/24/2019 1530   GLUCOSEU NEGATIVE 09/24/2019 1530   GLUCOSEU see comment 12/29/2012 1554   HGBUR MODERATE (A) 09/24/2019 1530   BILIRUBINUR NEGATIVE 09/24/2019 1530   BILIRUBINUR Negative 03/05/2019 1629   BILIRUBINUR see comment 12/29/2012 [place] 09/24/2019 1530   PROTEINUR 30 (A) 09/24/2019 1530   NITRITE NEGATIVE 09/24/2019 1530   LEUKOCYTESUR LARGE (A) 09/24/2019 1530   LEUKOCYTESUR see comment 12/29/2012 1554   Sepsis Labs Invalid input(s): PROCALCITONIN,  WBC,  LACTICIDVEN Microbiology Recent Results (from the past 240 hour(s))  Blood culture (routine x 2)     Status: Abnormal   Collection Time: 09/24/19  3:16 PM   Specimen: BLOOD  Result Value Ref Range Status   Specimen Description   Final    BLOOD BLOOD RIGHT ARM Performed at [facility], [address], [place], [place] [address]    Special Requests   Final    BOTTLES DRAWN AEROBIC AND ANAEROBIC Blood Culture adequate volume Performed at [facility], [address] [place], [place] [address]    Culture  Setup Time   Final    ANAEROBIC BOTTLE ONLY GRAM POSITIVE COCCI CRITICAL RESULT CALLED TO, READ BACK BY AND VERIFIED WITH: CHRISTINE KATSOUDAS AT 4765 ON 09/25/2019 [place]. Performed at [facility], [address] [place], [place] [address]    Culture (A)  Final    STAPHYLOCOCCUS SPECIES (COAGULASE NEGATIVE) THE SIGNIFICANCE OF ISOLATING THIS ORGANISM FROM A SINGLE SET OF BLOOD CULTURES WHEN MULTIPLE SETS ARE DRAWN IS UNCERTAIN. PLEASE NOTIFY THE MICROBIOLOGY DEPARTMENT WITHIN ONE WEEK IF SPECIATION AND SENSITIVITIES ARE REQUIRED. Performed at [facility], [place] [address], [place], [place] [address]    Report Status 09/27/2019 FINAL  Final  Blood culture (routine x 2)     Status: None (Preliminary result)   Collection Time:  09/24/19  3:30 PM   Specimen: BLOOD  Result Value Ref Range Status   Specimen Description BLOOD BLOOD RIGHT WRIST  Final   Special Requests   Final    BOTTLES DRAWN AEROBIC AND ANAEROBIC Blood Culture adequate volume   Culture   Final    NO GROWTH 4 DAYS Performed at [facility], [address], [place],  [address]    Report Status PENDING  Incomplete  Urine Culture     Status: Abnormal   Collection Time: 09/24/19  3:30 PM   Specimen: Urine, Random  Result Value Ref Range Status   Specimen Description   Final    URINE, RANDOM  Performed at [facility], [address] [place], [place] [address]    Special Requests   Final    NONE Performed at [facility], [place], [place] [address]    Culture (A)  Final    >=100,000 COLONIES/mL METHICILLIN RESISTANT STAPHYLOCOCCUS AUREUS   Report Status 09/26/2019 FINAL  Final   Organism ID, Bacteria METHICILLIN RESISTANT STAPHYLOCOCCUS AUREUS (A)  Final      Susceptibility   Methicillin resistant staphylococcus aureus - MIC*    CIPROFLOXACIN >=8 RESISTANT Resistant     GENTAMICIN <=0.5 SENSITIVE Sensitive     NITROFURANTOIN <=16 SENSITIVE Sensitive     OXACILLIN >=4 RESISTANT Resistant     TETRACYCLINE >=16 RESISTANT Resistant     VANCOMYCIN <=0.5 SENSITIVE Sensitive     TRIMETH/SULFA <=10 SENSITIVE Sensitive     CLINDAMYCIN >=8 RESISTANT Resistant     RIFAMPIN <=0.5 SENSITIVE Sensitive     Inducible Clindamycin NEGATIVE Sensitive     * >=100,000 COLONIES/mL METHICILLIN RESISTANT STAPHYLOCOCCUS AUREUS  C difficile quick scan w PCR reflex     Status: None   Collection Time: 09/24/19  4:00 PM   Specimen: STOOL  Result Value Ref Range Status   C Diff antigen NEGATIVE NEGATIVE Final   C Diff toxin NEGATIVE NEGATIVE Final   C Diff interpretation No C. difficile detected.  Final    Comment: Performed at [facility], [place], [place] [address]  SARS CORONAVIRUS  2 (TAT 6-24 HRS) Nasopharyngeal Nasopharyngeal Swab     Status: None   Collection Time: 09/24/19  6:52 PM   Specimen: Nasopharyngeal Swab  Result Value Ref Range Status   SARS Coronavirus 2 NEGATIVE NEGATIVE Final    Comment: (NOTE) SARS-CoV-2 target nucleic acids are NOT DETECTED. The SARS-CoV-2 RNA is generally detectable in upper and lower respiratory specimens during the acute phase of infection. Negative results do not preclude SARS-CoV-2 infection, do not rule out co-infections with other pathogens, and should not be used as the sole basis for treatment or other patient management decisions. Negative results must be combined with clinical observations, patient history, and epidemiological information. The expected result is Negative. Fact Sheet for Patients: SugarRoll.be Fact Sheet for Healthcare Providers: https://www.woods-mathews.com/ This test is not yet approved or cleared by the [place] FDA and  has been authorized for detection and/or diagnosis of SARS-CoV-2 by FDA under an Emergency Use Authorization (EUA). This EUA will remain  in effect (meaning this test can be used) for the duration of the COVID-19 declaration under Section 56 4(b)(1) of the Act, 21 U.S.C. section 360bbb-3(b)(1), unless the authorization is terminated or revoked sooner. Performed at [facility], [place] [address] [place], [place] [address]   MRSA PCR Screening     Status: Abnormal   Collection Time: 09/26/19  3:24 PM   Specimen: Nasal Mucosa; Nasopharyngeal  Result Value Ref Range Status   MRSA by PCR POSITIVE (A) NEGATIVE Final    Comment:        The GeneXpert MRSA Assay (FDA approved for NASAL specimens only), is one component of a comprehensive MRSA colonization surveillance program. It is not intended to diagnose MRSA infection nor to guide or monitor treatment for MRSA infections. RESULT CALLED TO, READ BACK BY AND VERIFIED  WITH: WANETTE MILES @1738  09/26/19 MJU Performed at [facility], [address] [place], [place] [address]    Acute metabolic encephalopathy:likely secondary to UTI in the setting of chronic hydronephrosis with  bilateral nephrostomy tubes.  Now encephalopathy has resolved ucx with MRSA, pt with ckd, with multiple allergies.  Sidelined Dr. Ola Spurr infectious disease after reviewing medications and urine culture he recommended Bactrim 1 DS daily (side effects were reviewed). Unlikely bacteremia: 1 of 2 growing gram positive cocci in anaerobic bottle only. Likely a containment,   Chronic hydronephrosis:with bilateral nephrostomy tube. Continueproscar andflomax   Thrombocytopenia: etiology unclear.No need for a transfusion at this time.no signs of bleed Will continue to monitor  Stage 2 Pressure ulcer wound:on left buttock.Wound care consulted  Right heel ulcer: stage I.Wound care consulted  Abdominal aortic aneurysm: stable at 5.3 cm. Will need CTA abdomen pelvis follow-up in 3 to 6 months outpatient apparently follows vascular surgery twice a year  Chronic anemia: no need for a transfusion at this time. Will continue to monitor  PAF:not on anticoagulation. Continue metoprolol  CKDIV:stable.Continue sodium bicarbonate. Will continue to monitor  Hypertension:continue metoprolol and torsemide  OSA:not on CPAP  Generalized weakness: PT /OT recs home health  Time coordinating discharge: Over 30 minutes  SIGNED:   Nolberto Hanlon, MD  [facility] 09/28/2019, 4:25 PM Pager   If 7PM-7AM, please contact night-coverage [email or web address] Password [email or web address]

## 2019-09-28 NOTE — Progress Notes (Signed)
Pt is being discharged home.  Discharge papers given and explained to pt's son,verbalized understanding.  Meds and f/u appointments reviewed.  Rx sent electronically to pharmacy.  Pt's son made aware.

## 2019-09-28 NOTE — Progress Notes (Addendum)
SLP F/U Note  Patient Details Name: Blake [name] MRN: [id number] DOB: 04/12/1923   Cancelled treatment:       Reason Eval/Treat Not Completed: (chart reviewed; consulted NSG). Labs/wbc and temp are WNL. Consulted NSG re: pt's status today. Both yesterday and today, pt has been tolerating bites/sips of po's w/ No overt s/s of aspiration reported to occur. No decline in pt's respiratory status during/post po intake. He is swallowing his pills Whole in Puree.  Recommend continue current diet w/ Minced, easy to eat foods for conservation of energy and Pills in Puree for safer swallowing. Recommend general aspiration precautions. NSG to reconsult ST services if any new needs arise during admission.     Orinda Kenner, MS, CCC-SLP [name] 09/28/2019, 1:52 PM

## 2019-09-28 NOTE — TOC Transition Note (Signed)
Transition of Care [facility]) - CM/SW Discharge Note   Patient Details  Name: Blake [name] MRN: [id number] Date of Birth: 06/20/1923  Transition of Care [facility]) CM/SW Contact:  Su Hilt, RN Phone Number: 09/28/2019, 2:58 PM   Clinical Narrative:    Patient to [facility] with [facility] today, he has DME at home and does not need additional, has transportation, is up to date with PCP, can afford his medications, no additional needs   Final next level of care: Home w Home Health Services Barriers to Discharge: Barriers Resolved   Patient Goals and CMS Choice Patient states their goals for this hospitalization and ongoing recovery are:: go home   Choice offered to / list presented to : Patient, Adult Children  Discharge Placement                       Discharge Plan and Services   Discharge Planning Services: CM Consult Post Acute Care Choice: Home Health          DME Arranged: N/A         HH Arranged: PT HH Agency: [place] ([place]) Date HH Agency Contacted: 09/25/19 Time HH Agency Contacted: 1240 Representative spoke with at [place]: [place] ([place]) Interventions     Readmission Risk Interventions Readmission Risk Prevention Plan 09/25/2019 12/30/2018 12/25/2018  Transportation Screening Complete Complete Complete  PCP or Specialist Appt within 3-5 Days Complete - -  HRI or Home Care Consult Complete - Complete  Social Work Consult for [place] Planning/Counseling Complete - Complete  Palliative Care Screening Not Applicable - -  Medication Review Press photographer) Referral to Pharmacy Complete Complete  HRI or Home Care Consult - Complete -  Some recent data might be hidden

## 2019-09-29 LAB — CULTURE, BLOOD (ROUTINE X 2)
Culture: NO GROWTH
Special Requests: ADEQUATE

## 2019-10-05 ENCOUNTER — Ambulatory Visit: Payer: Medicare PPO

## 2019-10-17 ENCOUNTER — Other Ambulatory Visit: Payer: Self-pay | Admitting: Radiology

## 2019-10-17 DIAGNOSIS — N1330 Unspecified hydronephrosis: Secondary | ICD-10-CM

## 2019-10-17 IMAGING — DX PORTABLE CHEST - 1 VIEW
1 series · 1 of 1 positions shown · non-contrast
Comparison: 11/05/2018.

CLINICAL DATA: Shortness of breath.

EXAM:
PORTABLE CHEST 1 VIEW

[chest ap]
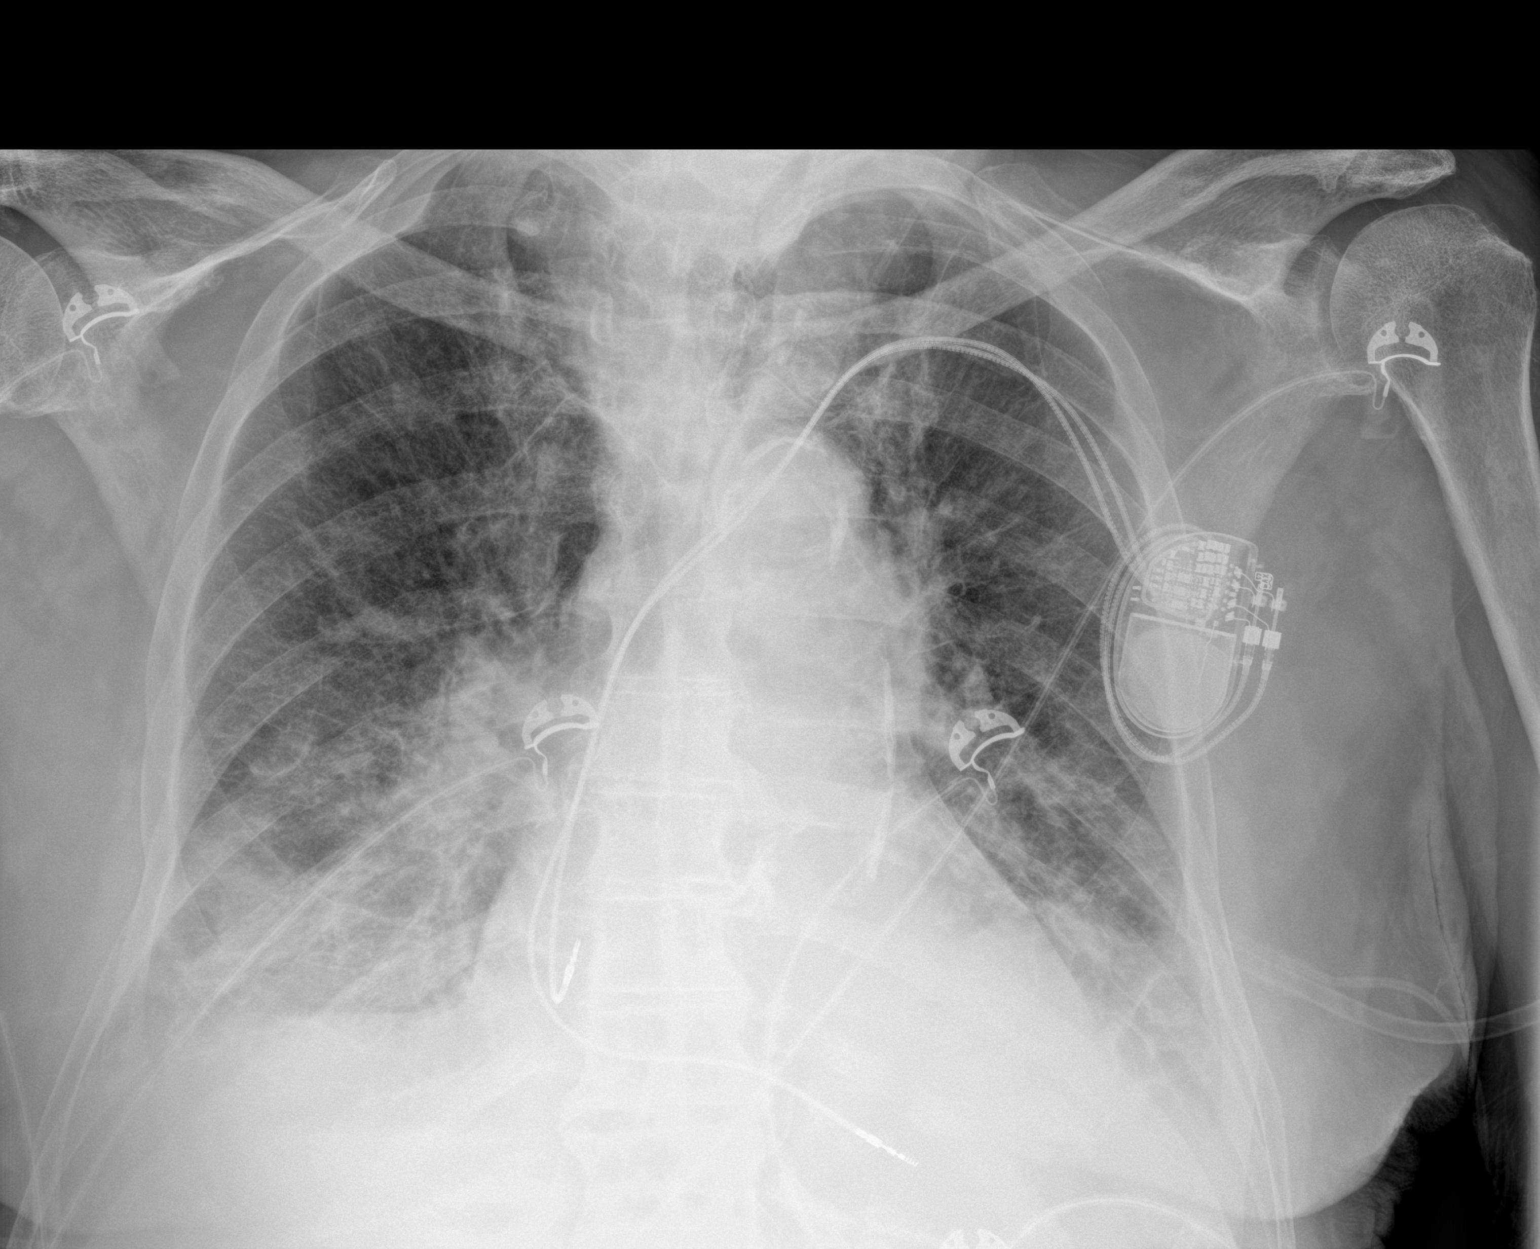

[1 of 1 positions shown; findings below may reference images not displayed]

FINDINGS: Cardiac pacer noted with lead tip over the right atrium right
ventricle. Heart size stable. Bibasilar infiltrates/edema and small
bilateral pleural effusions. Bibasilar atelectasis. No pneumothorax.
IMPRESSION: 1.  Cardiac pacer stable position.

2. Bibasilar infiltrates/edema and small bilateral pleural
effusions. Findings have increased from prior exam.

## 2019-10-18 IMAGING — CT CT IMAGE GUIDED DRAINAGE BY PERCUTANEOUS CATHETER
1 of 3 series · 13 of 32 positions shown, 18 images · non-contrast
Comparison: none

INDICATION: [AGE] male with a history of bilateral ureteral obstruction.
TECHNIQUE: Informed written consent was obtained from the patient after a
thorough discussion of the procedural risks, benefits and
alternatives. All questions were addressed. Maximal Sterile Barrier
Technique was utilized including caps, mask, sterile gowns, sterile
gloves, sterile drape, hand hygiene and skin antiseptic. A timeout
was performed prior to the initiation of the procedure.

[Series 2: i-spiral 5.0 b30f · axial · 0.69mm/px · z∈[+1374,+1542]mm · 13 of 56 slices shown, 18 images]
[im 4/56  soft-tissue]
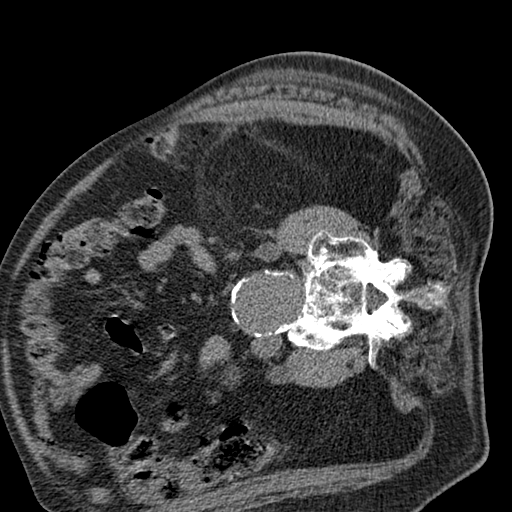
[im 4/56  bone]
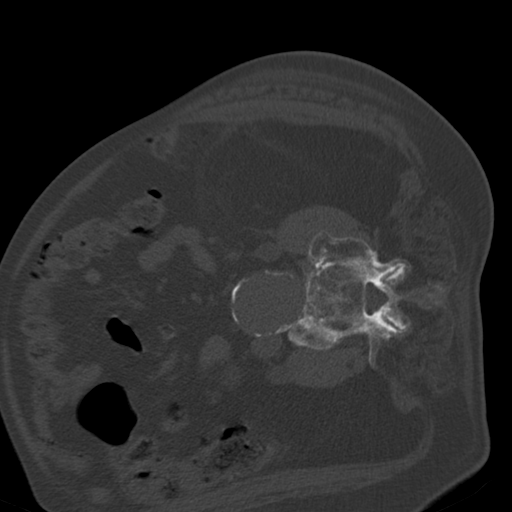
[im 10/56  soft-tissue]
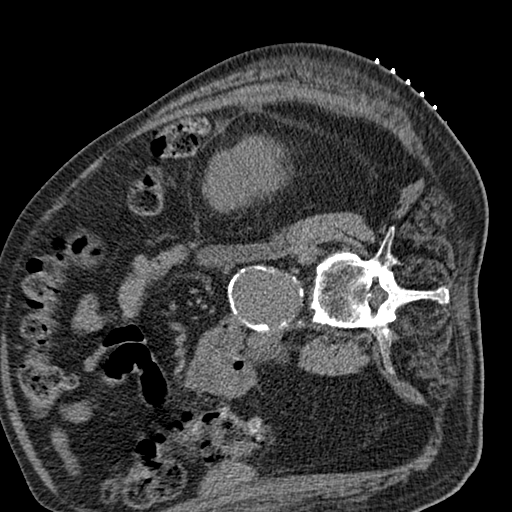
[im 13/56  soft-tissue]
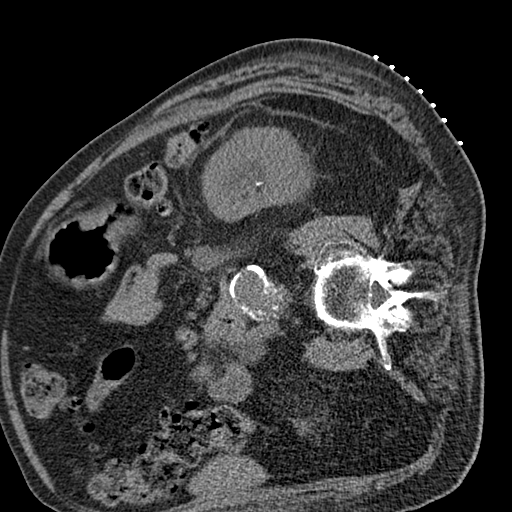
[im 17/56  soft-tissue]
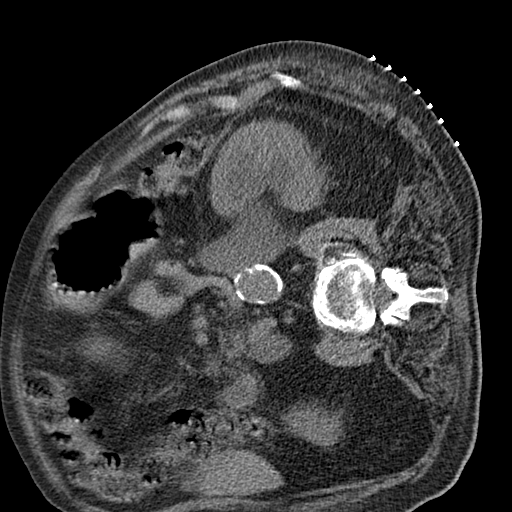
[im 23/56  soft-tissue]
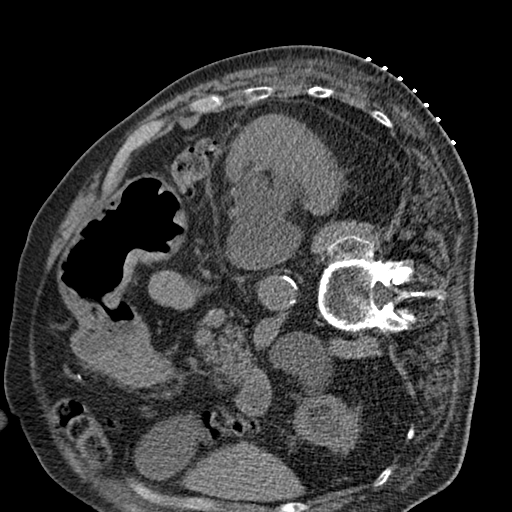
[im 26/56  soft-tissue]
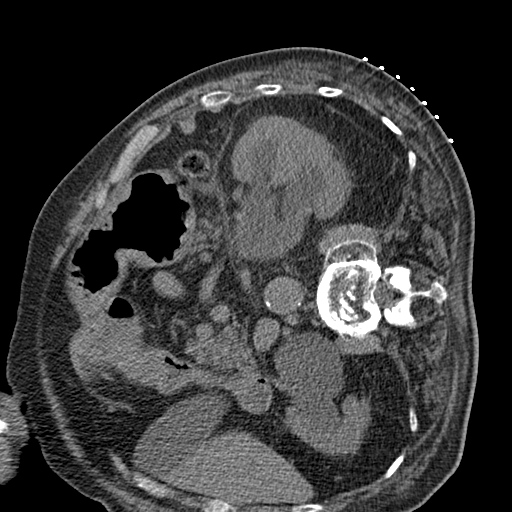
[im 30/56  soft-tissue]
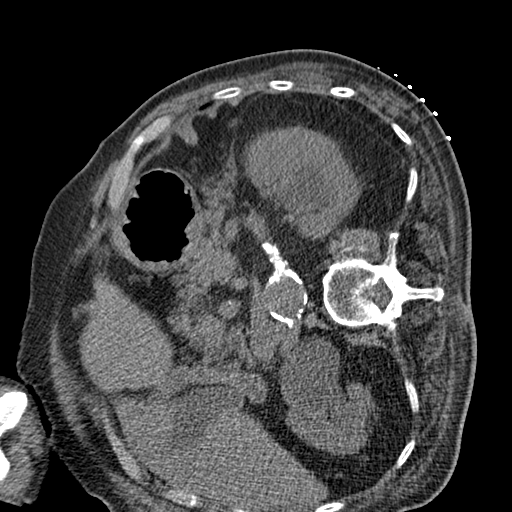
[im 36/56  soft-tissue]
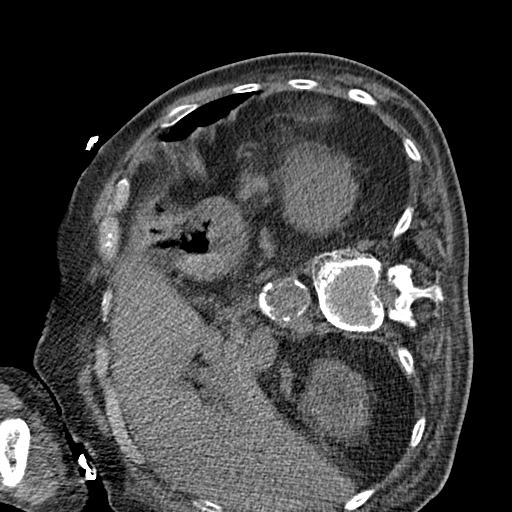
[im 39/56  soft-tissue]
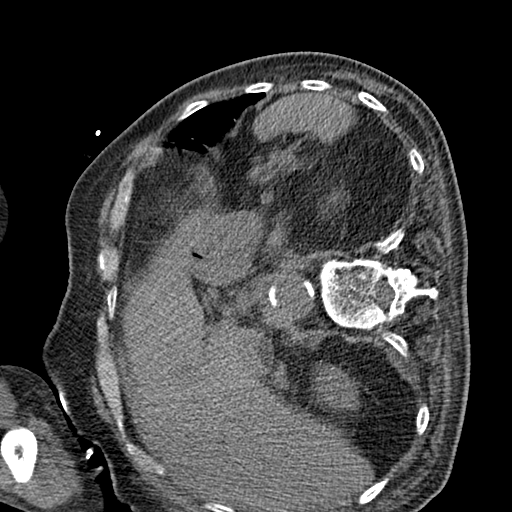
[im 39/56  bone]
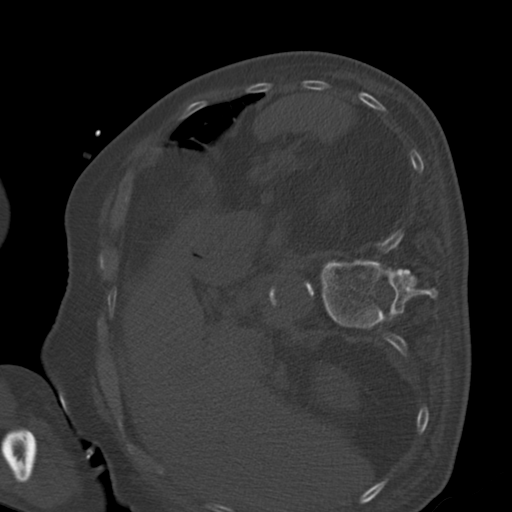
[im 43/56  soft-tissue]
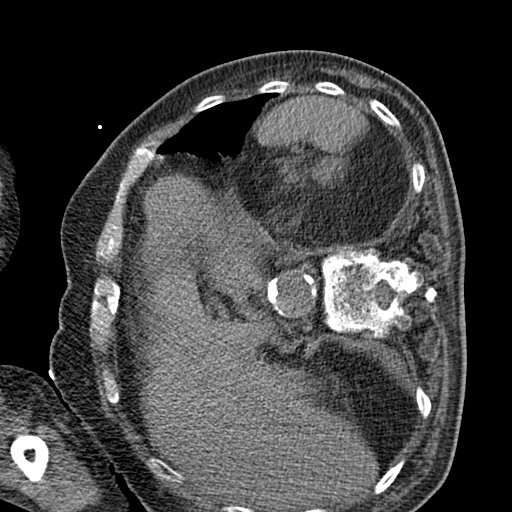
[im 43/56  lung]
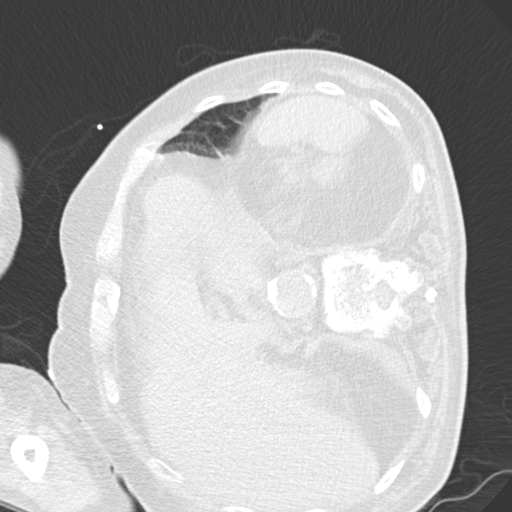
[im 46/56  lung]
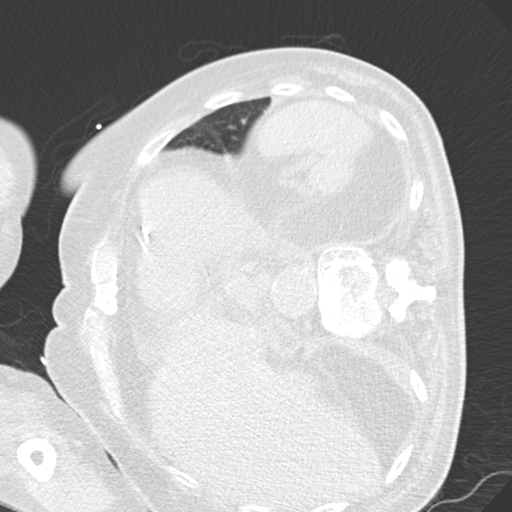
[im 49/56  soft-tissue]
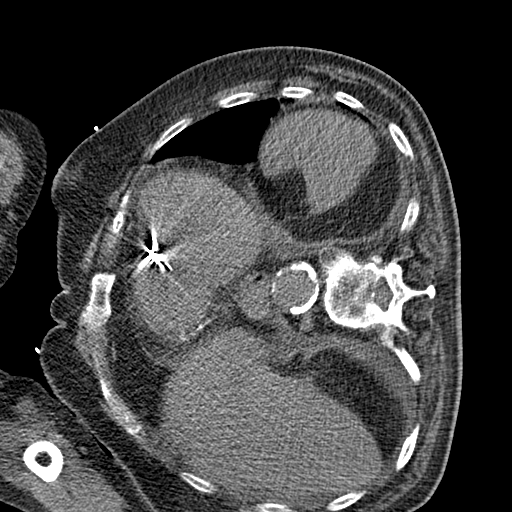
[im 49/56  lung]
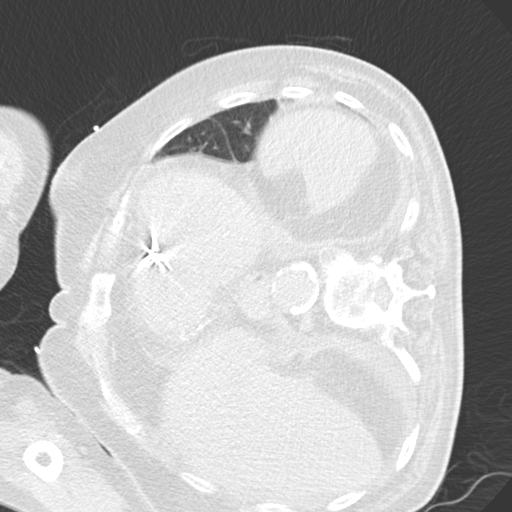
[im 52/56  soft-tissue]
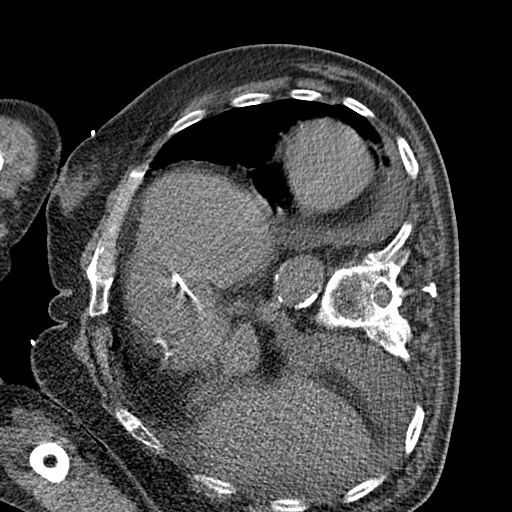
[im 52/56  lung]
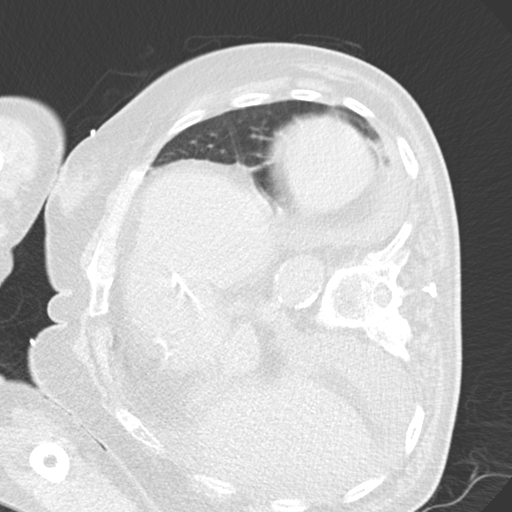

[13 of 32 positions shown; findings below may reference images not displayed]

EXAM:
CT AND ULTRASOUND GUIDED DRAINAGE OF LEFT KIDNEY

MEDICATIONS:
The patient is currently admitted to the hospital and receiving
intravenous antibiotics. The antibiotics were administered within an
appropriate time frame prior to the initiation of the procedure.

ANESTHESIA/SEDATION:
1.0 mg IV Versed 50 mcg IV Fentanyl

Moderate Sedation Time:  21 minutes

The patient was continuously monitored during the procedure by the
interventional radiology nurse under my direct supervision.

COMPLICATIONS:
None
PROCEDURE:
Informed written consent was obtained from the patient's family
after a thorough discussion of the procedural risks, benefits and
alternatives. All questions were addressed. Maximal Sterile Barrier
Technique was utilized including caps, mask, sterile gowns, sterile
gloves, sterile drape, hand hygiene and skin antiseptic. A timeout
was performed prior to the initiation of the procedure.

Patient positioned right decubitus position on the CT table. Scout
CT was performed for planning purposes. Ultrasound survey of the
left flank was performed with images stored and sent to PACs.

The patient was then prepped and draped in the usual sterile
fashion. 1% lidocaine was used to anesthetize the skin and
subcutaneous tissues for local anesthesia.

An 18 gauge trocar needle was then used to access a posterior
inferior calyx with ultrasound guidance. With spontaneous urine
returned through the needle, 035 wire was passed into the collecting
system. CT was performed to confirm location.

A small incision was made with an 11 blade scalpel, and the needle
was removed from the wire.

Ten French dilation was performed.

Ten French drain was then advanced over the wire into the collecting
system.

Wire was removed.  Catheter pigtail was locked.  CT was performed.

Catheter was sutured to the skin and attached to gravity drainage.

Patient tolerated the procedure well and remained hemodynamically
stable throughout.

No complications were encountered and no significant blood loss
encountered
FINDINGS: Scout CT in right decubitus position demonstrates persisting
left-sided hydronephrosis.

Images during the case demonstrate placement of 10 French drain
through inferior calyx into the collecting system.
IMPRESSION: Status post CT-guided placement of left-sided percutaneous
nephrostomy.

PLAN:
A single left-sided percutaneous access was performed given the need
for positioning in the right decubitus position secondary to the
patient's respiratory status.

If need be, we may re-evaluate for possible right-sided percutaneous
nephrostomy.

## 2019-11-03 ENCOUNTER — Emergency Department: Payer: Medicare PPO

## 2019-11-03 ENCOUNTER — Other Ambulatory Visit: Payer: Self-pay

## 2019-11-03 ENCOUNTER — Encounter: Payer: Self-pay | Admitting: *Deleted

## 2019-11-03 ENCOUNTER — Inpatient Hospital Stay
Admission: EM | Admit: 2019-11-03 | Discharge: 2019-11-05 | DRG: 690 | Disposition: A | Discharge status: Home Health | Payer: Medicare PPO | Attending: Internal Medicine | Admitting: Internal Medicine

## 2019-11-03 DIAGNOSIS — I714 Abdominal aortic aneurysm, without rupture: Secondary | ICD-10-CM | POA: Diagnosis present

## 2019-11-03 DIAGNOSIS — Z950 Presence of cardiac pacemaker: Secondary | ICD-10-CM

## 2019-11-03 DIAGNOSIS — I10 Essential (primary) hypertension: Secondary | ICD-10-CM | POA: Diagnosis present

## 2019-11-03 DIAGNOSIS — Z87891 Personal history of nicotine dependence: Secondary | ICD-10-CM

## 2019-11-03 DIAGNOSIS — G4733 Obstructive sleep apnea (adult) (pediatric): Secondary | ICD-10-CM | POA: Diagnosis present

## 2019-11-03 DIAGNOSIS — E785 Hyperlipidemia, unspecified: Secondary | ICD-10-CM | POA: Diagnosis present

## 2019-11-03 DIAGNOSIS — Z882 Allergy status to sulfonamides status: Secondary | ICD-10-CM

## 2019-11-03 DIAGNOSIS — N136 Pyonephrosis: Secondary | ICD-10-CM | POA: Diagnosis not present

## 2019-11-03 DIAGNOSIS — E1122 Type 2 diabetes mellitus with diabetic chronic kidney disease: Secondary | ICD-10-CM | POA: Diagnosis present

## 2019-11-03 DIAGNOSIS — Z20822 Contact with and (suspected) exposure to covid-19: Secondary | ICD-10-CM | POA: Diagnosis present

## 2019-11-03 DIAGNOSIS — I130 Hypertensive heart and chronic kidney disease with heart failure and stage 1 through stage 4 chronic kidney disease, or unspecified chronic kidney disease: Secondary | ICD-10-CM | POA: Diagnosis present

## 2019-11-03 DIAGNOSIS — N184 Chronic kidney disease, stage 4 (severe): Secondary | ICD-10-CM | POA: Diagnosis present

## 2019-11-03 DIAGNOSIS — I495 Sick sinus syndrome: Secondary | ICD-10-CM | POA: Diagnosis present

## 2019-11-03 DIAGNOSIS — Z923 Personal history of irradiation: Secondary | ICD-10-CM

## 2019-11-03 DIAGNOSIS — E119 Type 2 diabetes mellitus without complications: Secondary | ICD-10-CM

## 2019-11-03 DIAGNOSIS — N390 Urinary tract infection, site not specified: Secondary | ICD-10-CM

## 2019-11-03 DIAGNOSIS — Z823 Family history of stroke: Secondary | ICD-10-CM

## 2019-11-03 DIAGNOSIS — Z8546 Personal history of malignant neoplasm of prostate: Secondary | ICD-10-CM

## 2019-11-03 DIAGNOSIS — Z888 Allergy status to other drugs, medicaments and biological substances status: Secondary | ICD-10-CM

## 2019-11-03 DIAGNOSIS — I5032 Chronic diastolic (congestive) heart failure: Secondary | ICD-10-CM | POA: Diagnosis present

## 2019-11-03 DIAGNOSIS — G9341 Metabolic encephalopathy: Secondary | ICD-10-CM

## 2019-11-03 DIAGNOSIS — I4891 Unspecified atrial fibrillation: Secondary | ICD-10-CM | POA: Diagnosis present

## 2019-11-03 DIAGNOSIS — Z8249 Family history of ischemic heart disease and other diseases of the circulatory system: Secondary | ICD-10-CM

## 2019-11-03 DIAGNOSIS — K219 Gastro-esophageal reflux disease without esophagitis: Secondary | ICD-10-CM | POA: Diagnosis present

## 2019-11-03 DIAGNOSIS — D631 Anemia in chronic kidney disease: Secondary | ICD-10-CM | POA: Diagnosis present

## 2019-11-03 DIAGNOSIS — Z881 Allergy status to other antibiotic agents status: Secondary | ICD-10-CM

## 2019-11-03 LAB — URINALYSIS, COMPLETE (UACMP) WITH MICROSCOPIC
Bilirubin Urine: 0 — AB
Glucose, UA: 0 mg/dL — AB
Hgb urine dipstick: >50 — AB
Ketones, ur: 0 mg/dL — AB
Nitrite: NEGATIVE
Protein, ur: >300 mg/dL — AB
RBC / HPF: >50 RBC/hpf — ABNORMAL HIGH (ref 0–5)
Specific Gravity, Urine: 1.02 (ref 1.005–1.030)
Squamous Epithelial / HPF: NONE SEEN (ref 0–5)
WBC, UA: >50 WBC/hpf — ABNORMAL HIGH (ref 0–5)
pH: 5.5 (ref 5.0–8.0)

## 2019-11-03 LAB — RESPIRATORY PANEL BY RT PCR (FLU A&B, COVID)
Influenza A by PCR: NEGATIVE
Influenza B by PCR: NEGATIVE
SARS Coronavirus 2 by RT PCR: NEGATIVE

## 2019-11-03 LAB — COMPREHENSIVE METABOLIC PANEL
ALT: 13 U/L (ref 0–44)
AST: 19 U/L (ref 15–41)
Albumin: 3.2 g/dL — ABNORMAL LOW (ref 3.5–5.0)
Alkaline Phosphatase: 79 U/L (ref 38–126)
Anion gap: 14 (ref 5–15)
BUN: 82 mg/dL — ABNORMAL HIGH (ref 8–23)
CO2: 27 mmol/L (ref 22–32)
Calcium: 8.5 mg/dL — ABNORMAL LOW (ref 8.9–10.3)
Chloride: 91 mmol/L — ABNORMAL LOW (ref 98–111)
Creatinine, Ser: 3.95 mg/dL — ABNORMAL HIGH (ref 0.61–1.24)
GFR calc Af Amer: 14 mL/min — ABNORMAL LOW (ref >60)
GFR calc non Af Amer: 12 mL/min — ABNORMAL LOW (ref >60)
Glucose, Bld: 181 mg/dL — ABNORMAL HIGH (ref 70–99)
Potassium: 3.8 mmol/L (ref 3.5–5.1)
Sodium: 132 mmol/L — ABNORMAL LOW (ref 135–145)
Total Bilirubin: 0.9 mg/dL (ref 0.3–1.2)
Total Protein: 7.9 g/dL (ref 6.5–8.1)

## 2019-11-03 LAB — CBC
HCT: 33.7 % — ABNORMAL LOW (ref 39.0–52.0)
Hemoglobin: 10.9 g/dL — ABNORMAL LOW (ref 13.0–17.0)
MCH: 27.1 pg (ref 26.0–34.0)
MCHC: 32.3 g/dL (ref 30.0–36.0)
MCV: 83.8 fL (ref 80.0–100.0)
Platelets: 86 10*3/uL — ABNORMAL LOW (ref 150–400)
RBC: 4.02 MIL/uL — ABNORMAL LOW (ref 4.22–5.81)
RDW: 18.4 % — ABNORMAL HIGH (ref 11.5–15.5)
WBC: 6.2 10*3/uL (ref 4.0–10.5)
nRBC: 0 % (ref 0.0–0.2)

## 2019-11-03 LAB — LIPASE, BLOOD: Lipase: 34 U/L (ref 11–51)

## 2019-11-03 MED ORDER — SODIUM CHLORIDE 0.9% FLUSH: 3.0000 mL | Freq: Once | INTRAVENOUS | Status: DC

## 2019-11-03 MED ORDER — VANCOMYCIN HCL 1750 MG/350ML IV SOLN
1750.0000 mg | Freq: Once | INTRAVENOUS | Status: AC
  Administered 2019-11-04: 1750 mg via INTRAVENOUS
  Filled 2019-11-03: qty 350

## 2019-11-03 MED ORDER — SODIUM CHLORIDE 0.9 % IV SOLN
1.0000 g | Freq: Once | INTRAVENOUS | Status: AC
  Administered 2019-11-04: 1 g via INTRAVENOUS
  Filled 2019-11-03: qty 1

## 2019-11-03 NOTE — ED Provider Notes (Signed)
[facility] Emergency Department Provider Note  ____________________________________________   First MD Initiated Contact with Patient 11/03/19 2002     (approximate)  I have reviewed the triage vital signs and the nursing notes.  History  Chief Complaint Abdominal Pain    HPI Blake [name] is a 86 y.o. male with a history of prostate cancer s/p radiation therapy with chronic hydronephrosis with bilateral nephrostomy tubes, sick sinus syndrome s/p pacemaker, CKD, OSA, AAA, atrial fibrillation not on anticoagulation who presents emergency department for abdominal pain, increased thirst, and confusion.  Per son at bedside patient has been complaining of vague abdominal discomfort over the last several days.  Today he started exhibiting some confusion.  Asking his son to call his deceased siblings, for example. Also complaining that he has to urinate, even though he never complains of this. More thirsty than normal. Son noticed some blood in the nephrostomy tubes. Symptoms are all similar to when he was admitted in mid March for severe UTI.  UA at that time was positive for MRSA.   Past Medical Hx Past Medical History:  Diagnosis Date  . AAA (abdominal aortic aneurysm) ([place])   . Arrhythmia   . CHF (congestive heart failure) ([place])   . Diabetes mellitus without complication ([place])   . GERD (gastroesophageal reflux disease)   . GI bleed   . Heart murmur   . Hyperlipidemia   . Hypertension   . Pacemaker   . Pneumonia   . Prostate cancer ([place])   . Sleep apnea     Problem List Patient Active Problem List   Diagnosis Date Noted  . AMS (altered mental status) 09/24/2019  . CKD (chronic kidney disease) stage 4, GFR 15-29 ml/min (HCC) 09/24/2019  . Anemia 09/24/2019  . OSA (obstructive sleep apnea) 09/24/2019  . Seizure ([place]) 01/03/2019  . Acute CHF (congestive heart failure) ([place]) 12/25/2018  . Acute renal failure (ARF) ([place]) 11/21/2018  . AKI (acute  kidney injury) ([place])   . Goals of care, counseling/discussion   . Palliative care by specialist   . DNR (do not resuscitate) discussion   . Seizure-like activity ([place]) 11/05/2018  . Allergic rhinitis 07/24/2018  . Cardiac pacemaker in situ 07/24/2018  . History of sleep apnea 07/24/2018  . Macular degeneration 07/24/2018  . Trigeminal neuralgia pain 01/11/2018  . TMJ pain dysfunction syndrome 01/11/2018  . Recurrent UTI 09/26/2017  . Urothelial carcinoma of bladder ([place]) 09/26/2017  . Pressure ulcer, stage II ([place]) 09/12/2017  . SIRS (systemic inflammatory response syndrome) ([place]) 09/10/2017  . Chronic cystitis 08/25/2017  . Sepsis ([place]) 06/10/2017  . CAP (community acquired pneumonia) 06/10/2017  . Infection due to enterococcus 06/07/2017  . UTI (urinary tract infection) 05/17/2017  . Blood in stool   . Hemorrhage of rectum and anus   . Lower GI bleed 10/20/2016  . Near syncope   . Acute renal failure superimposed on chronic kidney disease ([place])   . Pneumonia 10/09/2016  . Aspiration pneumonia ([place]) 10/09/2016  . Acute respiratory failure ([place]) 10/09/2016  . Hydronephrosis, bilateral 07/19/2016  . Spinal stenosis of lumbar region with neurogenic claudication 07/19/2016  . AAA (abdominal aortic aneurysm) without rupture ([place]) 04/27/2016  . Arthritis, degenerative 03/31/2016  . A-fib ([place]) 03/31/2016  . HLD (hyperlipidemia) 03/31/2016  . CA of prostate ([place]) 03/31/2016  . Central perforation of tympanic membrane of right ear 03/26/2016  . 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  Presbycusis of left ear with restricted hearing of right ear 03/01/2016  . Chronic kidney disease, stage 3 (moderate) 10/30/2015  . Episode of unresponsiveness 10/30/2015  . Sick sinus syndrome ([place]) 10/30/2015  . Thrombocytopenia, unspecified ([place]) 07/24/2015  . Fitting or adjustment of cardiac pacemaker 05/09/2015  . SVT  (supraventricular tachycardia) ([place]) 04/09/2015  . Diabetes mellitus, type 2 ([place]) 04/09/2015  . Benign hypertension 04/09/2015  . Acute myocardial infarction, initial episode of care ([place]) 04/09/2015  . Phimosis 01/30/2014  . Bladder outlet obstruction 04/18/2013  . Dysuria 04/18/2013  . Gross hematuria 04/18/2013  . Incomplete emptying of bladder 11/26/2012  . Personal history of prostate cancer 11/26/2012    Past Surgical Hx Past Surgical History:  Procedure Laterality Date  . FLEXIBLE SIGMOIDOSCOPY N/A 10/21/2016   Procedure: FLEXIBLE SIGMOIDOSCOPY;  Surgeon: [name], MD;  Location: [facility] ENDOSCOPY;  Service: Endoscopy;  Laterality: N/A;  . INSERT / REPLACE / REMOVE PACEMAKER    . IR NEPHROSTOGRAM LEFT THRU EXISTING ACCESS  01/16/2019  . IR NEPHROSTOMY EXCHANGE LEFT  02/06/2019  . IR NEPHROSTOMY EXCHANGE LEFT  04/12/2019  . IR NEPHROSTOMY EXCHANGE LEFT  06/01/2019  . IR NEPHROSTOMY EXCHANGE LEFT  07/20/2019  . IR NEPHROSTOMY EXCHANGE LEFT  09/19/2019  . IR NEPHROSTOMY EXCHANGE RIGHT  02/06/2019  . IR NEPHROSTOMY EXCHANGE RIGHT  04/12/2019  . IR NEPHROSTOMY EXCHANGE RIGHT  06/01/2019  . IR NEPHROSTOMY EXCHANGE RIGHT  07/20/2019  . IR NEPHROSTOMY EXCHANGE RIGHT  09/19/2019  . IR NEPHROSTOMY PLACEMENT RIGHT  01/01/2019  . JOINT REPLACEMENT    . NEPHROSTOMY Bilateral   . PACEMAKER INSERTION Left 04/14/2015   Procedure: INSERTION PACEMAKER;  Surgeon: Isaias Cowman, MD;  Location: [facility] ORS;  Service: Cardiovascular;  Laterality: Left;  . PROSTATE SURGERY    . VISCERAL ARTERY INTERVENTION N/A 10/22/2016   Procedure: Visceral Artery Intervention;  Surgeon: Algernon Huxley, MD;  Location: [facility];  Service: Cardiovascular;  Laterality: N/A;    Medications Prior to Admission medications   Medication Sig Start Date End Date Taking? Authorizing Provider  azelastine (ASTELIN) 0.1 % nasal spray Place 1 spray into both nostrils 2 (two) times daily as needed for rhinitis or allergies.     [provider]  feeding supplement, GLUCERNA SHAKE, (GLUCERNA SHAKE) LIQD Take 237 mLs by mouth 2 (two) times daily between meals. 01/01/19   Fritzi Mandes, MD  finasteride (PROSCAR) 5 MG tablet Take 5 mg by mouth daily.    [provider]  Iron-Vitamin C (VITRON-C PO) Take 1 tablet by mouth daily.    [provider]  metoprolol tartrate 37.5 MG TABS Take 37.5 mg by mouth 2 (two) times daily. 09/13/17   Gladstone Lighter, MD  multivitamin-iron-minerals-folic acid (CENTRUM) chewable tablet Chew 2 tablets by mouth daily.    [provider]  pantoprazole (PROTONIX) 40 MG tablet Take 40 mg by mouth daily.    [provider]  rosuvastatin (CRESTOR) 5 MG tablet Take 5 mg by mouth at bedtime.    [provider]  sitaGLIPtin (JANUVIA) 50 MG tablet Take 0.5 tablets (25 mg total) by mouth daily. 01/01/19   Fritzi Mandes, MD  sodium bicarbonate 650 MG tablet Take 1 tablet (650 mg total) by mouth 2 (two) times daily. 09/13/17   Gladstone Lighter, MD  tamsulosin (FLOMAX) 0.4 MG CAPS capsule Take 0.4 mg by mouth daily.     [provider]  torsemide (DEMADEX) 20 MG tablet Take 40 mg by mouth 2 (two) times daily.  03/29/19 03/28/20  [provider]    Allergies Cefepime, Diovan [valsartan], Gabapentin, Hydralazine, Lisinopril, Pregabalin, Levofloxacin, and Sulfamethoxazole-trimethoprim  Family Hx Family History  Problem Relation Age of Onset  . Hypertension Other   . Stroke Other   . Heart attack Other   . Stroke Sister   . Heart attack Brother     Social Hx Social History   Tobacco Use  . Smoking status: Former Research scientist (life sciences)  . Smokeless tobacco: Never Used  Substance Use Topics  . Alcohol use: No  . Drug use: No     Review of Systems  Constitutional: Negative for fever. Negative for chills. + confusion per son Eyes: Negative for visual changes. ENT: Negative for sore throat. Cardiovascular: Negative for chest  pain. Respiratory: Negative for shortness of breath. Gastrointestinal: + abdominal pain Genitourinary: + blood in urine Musculoskeletal: Negative for leg swelling. Skin: Negative for rash. Neurological: Negative for headaches.   Physical Exam  Vital Signs: ED Triage Vitals [11/03/19 2004]  Enc Vitals Group     BP (!) 147/110     Pulse Rate 91     Resp 18     Temp (!) 97.1 F (36.2 C)     Temp Source Oral     SpO2 95 %     Weight      Height      Head Circumference      Peak Flow      Pain Score      Pain Loc      Pain Edu?      Excl. in [place]?     Constitutional: Alert and awake. Elderly, but otherwise well appearing.  Head: Normocephalic. Atraumatic. Eyes: Conjunctivae clear. Sclera anicteric. Pupils equal and symmetric. Nose: No masses or lesions. No congestion or rhinorrhea. Mouth/Throat: Wearing mask.  Neck: No stridor. Trachea midline.  Cardiovascular: Normal rate, regular rhythm. Extremities well perfused. Respiratory: Normal respiratory effort.  Lungs CTAB. Gastrointestinal: Soft. Non-distended. Non-tender. Umbilical hernia, easily reduced.  Genitourinary: Deferred. Musculoskeletal: No lower extremity edema. No deformities. Neurologic:  Normal speech and language. No gross focal or lateralizing neurologic deficits are appreciated. Perhaps mildly confused (based on son's report), but can answer questions seemingly appropriately.  Psychiatric: Mood and affect are appropriate for situation.  EKG  Personally reviewed and interpreted by myself.   Date: 11/03/19 Time: 2025 Rate: 88 Rhythm: flutter Axis: left Intervals: prolonged QTc RBBB, LAFB Seen previously  No STEMI    Radiology  Personally reviewed available imaging myself.   CT A/P - IMPRESSION:  1. Bilateral percutaneous nephrostomies with interval improvement of  the previously seen right hydronephrosis with minimal residual right  hydronephrosis.  2. Severe colonic diverticulosis. No bowel  obstruction.  3. A 5.4 cm infrarenal abdominal aortic aneurysm as seen previously.  4. Small right pleural effusion with associated right lung base  atelectasis or infiltrate, similar to prior CT.  5. Aortic Atherosclerosis (ICD10-I70.0).    Procedures  Procedure(s) performed (including critical care):  Procedures   Initial Impression / Assessment and Plan / MDM / ED Course  86 y.o. male who presents to the ED for abdominal pain, changes to urine, confusion.  Symptoms consistent with prior episode of UTI.  Admitted in March for encephalopathy secondary to MRSA UTI  Ddx: recurrent UTI, dehydration, electrolyte abnormality, intra-abdominal pathology, dislodged nephrostomy tube  Will plan for labs, urine studies, imaging  Labs reveal creatinine near baseline.  Slightly decreased sodium, chloride, receiving IV fluids.  UA concerning for infection.  Granted  this is based off of his nephrostomy tube sample, however given the urinalysis in the setting of his symptoms, does seem consistent with a UTI.  Will opt to treat.  Given he had a MRSA UTI previously we will dose with vancomycin and ceftriaxone.  CT scan as above.  Son at bedside updated on results and plan of care.  He voices understanding and is comfortable with admission.    _______________________________   As part of my medical decision making I have reviewed available labs, radiology tests, reviewed old records/performed chart review, obtained additional history from family, son at bedside.   Final Clinical Impression(s) / ED Diagnosis  Final diagnoses:  Urinary tract infection in elderly patient       Note:  This document was prepared using Dragon voice recognition software and may include unintentional dictation errors.   Lilia Pro., MD 11/03/19 [phone]

## 2019-11-03 NOTE — ED Triage Notes (Signed)
Patient arrives via [facility] from home. Per report by medic, the son called out with concerns his father may have a UTI, pt is currently on abx for UTI at this time. Pt has been altered, (normally alert and oriented, but since last night, has not been oriented), having abdominal pain around the umbilicus and c/o pain in the right back at the site of the drain insertion site (has bilateral nephrostomy tubes). En route, 168/111, hr 90's, 97% RA, 97.4, cbg 216.

## 2019-11-04 ENCOUNTER — Encounter: Payer: Self-pay | Admitting: Internal Medicine

## 2019-11-04 DIAGNOSIS — N184 Chronic kidney disease, stage 4 (severe): Secondary | ICD-10-CM | POA: Diagnosis present

## 2019-11-04 DIAGNOSIS — Z950 Presence of cardiac pacemaker: Secondary | ICD-10-CM | POA: Diagnosis not present

## 2019-11-04 DIAGNOSIS — K219 Gastro-esophageal reflux disease without esophagitis: Secondary | ICD-10-CM | POA: Diagnosis present

## 2019-11-04 DIAGNOSIS — I10 Essential (primary) hypertension: Secondary | ICD-10-CM | POA: Diagnosis not present

## 2019-11-04 DIAGNOSIS — G4733 Obstructive sleep apnea (adult) (pediatric): Secondary | ICD-10-CM | POA: Diagnosis present

## 2019-11-04 DIAGNOSIS — Z8249 Family history of ischemic heart disease and other diseases of the circulatory system: Secondary | ICD-10-CM | POA: Diagnosis not present

## 2019-11-04 DIAGNOSIS — D631 Anemia in chronic kidney disease: Secondary | ICD-10-CM | POA: Diagnosis present

## 2019-11-04 DIAGNOSIS — I4891 Unspecified atrial fibrillation: Secondary | ICD-10-CM | POA: Diagnosis present

## 2019-11-04 DIAGNOSIS — Z20822 Contact with and (suspected) exposure to covid-19: Secondary | ICD-10-CM | POA: Diagnosis present

## 2019-11-04 DIAGNOSIS — Z923 Personal history of irradiation: Secondary | ICD-10-CM | POA: Diagnosis not present

## 2019-11-04 DIAGNOSIS — I495 Sick sinus syndrome: Secondary | ICD-10-CM | POA: Diagnosis present

## 2019-11-04 DIAGNOSIS — G9341 Metabolic encephalopathy: Secondary | ICD-10-CM | POA: Diagnosis not present

## 2019-11-04 DIAGNOSIS — N390 Urinary tract infection, site not specified: Secondary | ICD-10-CM | POA: Diagnosis present

## 2019-11-04 DIAGNOSIS — I482 Chronic atrial fibrillation, unspecified: Secondary | ICD-10-CM

## 2019-11-04 DIAGNOSIS — G9340 Encephalopathy, unspecified: Secondary | ICD-10-CM | POA: Diagnosis not present

## 2019-11-04 DIAGNOSIS — I714 Abdominal aortic aneurysm, without rupture: Secondary | ICD-10-CM | POA: Diagnosis present

## 2019-11-04 DIAGNOSIS — I130 Hypertensive heart and chronic kidney disease with heart failure and stage 1 through stage 4 chronic kidney disease, or unspecified chronic kidney disease: Secondary | ICD-10-CM | POA: Diagnosis present

## 2019-11-04 DIAGNOSIS — Z823 Family history of stroke: Secondary | ICD-10-CM | POA: Diagnosis not present

## 2019-11-04 DIAGNOSIS — E785 Hyperlipidemia, unspecified: Secondary | ICD-10-CM | POA: Diagnosis present

## 2019-11-04 DIAGNOSIS — I5032 Chronic diastolic (congestive) heart failure: Secondary | ICD-10-CM | POA: Diagnosis present

## 2019-11-04 DIAGNOSIS — Z882 Allergy status to sulfonamides status: Secondary | ICD-10-CM | POA: Diagnosis not present

## 2019-11-04 DIAGNOSIS — E1122 Type 2 diabetes mellitus with diabetic chronic kidney disease: Secondary | ICD-10-CM

## 2019-11-04 DIAGNOSIS — Z8546 Personal history of malignant neoplasm of prostate: Secondary | ICD-10-CM | POA: Diagnosis not present

## 2019-11-04 DIAGNOSIS — D649 Anemia, unspecified: Secondary | ICD-10-CM | POA: Diagnosis not present

## 2019-11-04 DIAGNOSIS — N189 Chronic kidney disease, unspecified: Secondary | ICD-10-CM | POA: Diagnosis not present

## 2019-11-04 DIAGNOSIS — Z881 Allergy status to other antibiotic agents status: Secondary | ICD-10-CM | POA: Diagnosis not present

## 2019-11-04 DIAGNOSIS — Z87891 Personal history of nicotine dependence: Secondary | ICD-10-CM | POA: Diagnosis not present

## 2019-11-04 DIAGNOSIS — N136 Pyonephrosis: Secondary | ICD-10-CM | POA: Diagnosis present

## 2019-11-04 DIAGNOSIS — Z888 Allergy status to other drugs, medicaments and biological substances status: Secondary | ICD-10-CM | POA: Diagnosis not present

## 2019-11-04 LAB — BASIC METABOLIC PANEL
Anion gap: 13 (ref 5–15)
BUN: 82 mg/dL — ABNORMAL HIGH (ref 8–23)
CO2: 27 mmol/L (ref 22–32)
Calcium: 8.4 mg/dL — ABNORMAL LOW (ref 8.9–10.3)
Chloride: 95 mmol/L — ABNORMAL LOW (ref 98–111)
Creatinine, Ser: 3.69 mg/dL — ABNORMAL HIGH (ref 0.61–1.24)
GFR calc Af Amer: 15 mL/min — ABNORMAL LOW (ref >60)
GFR calc non Af Amer: 13 mL/min — ABNORMAL LOW (ref >60)
Glucose, Bld: 130 mg/dL — ABNORMAL HIGH (ref 70–99)
Potassium: 3.5 mmol/L (ref 3.5–5.1)
Sodium: 135 mmol/L (ref 135–145)

## 2019-11-04 LAB — GLUCOSE, CAPILLARY
Glucose-Capillary: 126 mg/dL — ABNORMAL HIGH (ref 70–99)
Glucose-Capillary: 131 mg/dL — ABNORMAL HIGH (ref 70–99)
Glucose-Capillary: 131 mg/dL — ABNORMAL HIGH (ref 70–99)
Glucose-Capillary: 152 mg/dL — ABNORMAL HIGH (ref 70–99)
Glucose-Capillary: 138 mg/dL — ABNORMAL HIGH (ref 70–99)

## 2019-11-04 LAB — HEMOGLOBIN A1C
Hgb A1c MFr Bld: 7.5 % — ABNORMAL HIGH (ref 4.8–5.6)
Mean Plasma Glucose: 168.55 mg/dL

## 2019-11-04 MED ORDER — TAMSULOSIN HCL 0.4 MG PO CAPS
0.4000 mg | ORAL_CAPSULE | Freq: Every day | ORAL | Status: DC
  Administered 2019-11-04 – 2019-11-05 (×2): 0.4 mg via ORAL
  Filled 2019-11-04 (×2): qty 1

## 2019-11-04 MED ORDER — TORSEMIDE 20 MG PO TABS
40.0000 mg | ORAL_TABLET | Freq: Two times a day (BID) | ORAL | Status: DC
  Administered 2019-11-04 – 2019-11-05 (×3): 40 mg via ORAL
  Filled 2019-11-04 (×3): qty 2

## 2019-11-04 MED ORDER — SODIUM BICARBONATE 650 MG PO TABS
650.0000 mg | ORAL_TABLET | Freq: Two times a day (BID) | ORAL | Status: DC
  Administered 2019-11-04 – 2019-11-05 (×4): 650 mg via ORAL
  Filled 2019-11-04 (×5): qty 1

## 2019-11-04 MED ORDER — INSULIN ASPART 100 UNIT/ML ~~LOC~~ SOLN
0.0000 [IU] | Freq: Three times a day (TID) | SUBCUTANEOUS | Status: DC
  Administered 2019-11-04: 2 [IU] via SUBCUTANEOUS
  Administered 2019-11-04 – 2019-11-05 (×3): 1 [IU] via SUBCUTANEOUS
  Filled 2019-11-04 (×4): qty 1

## 2019-11-04 MED ORDER — MORPHINE SULFATE (PF) 2 MG/ML IV SOLN: 2.0000 mg | INTRAVENOUS | Status: DC | PRN

## 2019-11-04 MED ORDER — DICLOFENAC SODIUM 1 % EX GEL
2.0000 g | Freq: Two times a day (BID) | CUTANEOUS | Status: DC | PRN
  Administered 2019-11-04: 2 g via TOPICAL
  Filled 2019-11-04: qty 100

## 2019-11-04 MED ORDER — HEPARIN SODIUM (PORCINE) 5000 UNIT/ML IJ SOLN
5000.0000 [IU] | Freq: Three times a day (TID) | INTRAMUSCULAR | Status: DC
  Administered 2019-11-04 – 2019-11-05 (×4): 5000 [IU] via SUBCUTANEOUS
  Filled 2019-11-04 (×4): qty 1

## 2019-11-04 MED ORDER — SODIUM CHLORIDE 0.45 % IV SOLN: INTRAVENOUS | Status: DC

## 2019-11-04 MED ORDER — ROSUVASTATIN CALCIUM 10 MG PO TABS
5.0000 mg | ORAL_TABLET | Freq: Every day | ORAL | Status: DC
  Administered 2019-11-04: 23:00:00 5 mg via ORAL
  Filled 2019-11-04: qty 1

## 2019-11-04 MED ORDER — GLUCERNA SHAKE PO LIQD
237.0000 mL | Freq: Two times a day (BID) | ORAL | Status: DC
  Administered 2019-11-04 – 2019-11-05 (×4): 237 mL via ORAL

## 2019-11-04 MED ORDER — METOPROLOL TARTRATE 25 MG PO TABS
37.5000 mg | ORAL_TABLET | Freq: Two times a day (BID) | ORAL | Status: DC
  Administered 2019-11-04 – 2019-11-05 (×4): 37.5 mg via ORAL
  Filled 2019-11-04 (×4): qty 2

## 2019-11-04 MED ORDER — MORPHINE SULFATE (PF) 2 MG/ML IV SOLN
1.0000 mg | INTRAVENOUS | Status: DC | PRN
  Administered 2019-11-04: 05:00:00 1 mg via INTRAVENOUS
  Filled 2019-11-04: qty 1

## 2019-11-04 MED ORDER — FINASTERIDE 5 MG PO TABS
5.0000 mg | ORAL_TABLET | Freq: Every day | ORAL | Status: DC
  Filled 2019-11-04: qty 1

## 2019-11-04 MED ORDER — ENOXAPARIN SODIUM 40 MG/0.4ML ~~LOC~~ SOLN: 30.0000 mg | SUBCUTANEOUS | Status: DC

## 2019-11-04 MED ORDER — VANCOMYCIN HCL 750 MG/150ML IV SOLN
750.0000 mg | INTRAVENOUS | Status: DC
  Filled 2019-11-04: qty 150

## 2019-11-04 MED ORDER — ACETAMINOPHEN 325 MG PO TABS
650.0000 mg | ORAL_TABLET | Freq: Four times a day (QID) | ORAL | Status: DC | PRN
  Administered 2019-11-04: 12:00:00 650 mg via ORAL
  Filled 2019-11-04: qty 2

## 2019-11-04 MED ORDER — PANTOPRAZOLE SODIUM 40 MG PO TBEC
40.0000 mg | DELAYED_RELEASE_TABLET | Freq: Every day | ORAL | Status: DC
  Administered 2019-11-04 – 2019-11-05 (×2): 40 mg via ORAL
  Filled 2019-11-04 (×2): qty 1

## 2019-11-04 NOTE — TOC Initial Note (Addendum)
Transition of Care [facility]) - Initial/Assessment Note    Patient Details  Name: Blake [name] MRN: [id number] Date of Birth: 1922-07-14  [facility]) CM/SW Contact:    Boris Sharper, LCSW Phone Number: 11/04/2019, 1:43 PM  Clinical Narrative:                 CSW contacted pt's family contacts and spoke with pt's son Jonavan. CSW notified pt's son of PT recommendations of SNF. Pt's son stated that he does not think that is necessary becised he is able to move about on his own.  Pt is currently followed by a [facility] agency. He was unable to remember the name of the agency that follows him and stated that his sister Karie Kirks keeps up with all that. PT's son stated that Karie Kirks is out of town and it will be best to follow up with her tomorrow.   2:12 Spoke with pt's daughter Karie Kirks and she is in agreement that they want him to come home and continue with [facility] services. She notified me that [facility] follows him for [facility]. Pt lives with [place].   TOC will continue to follow for discharge planning needs.  Expected Discharge Plan: [place] Barriers to Discharge: Continued Medical Work up   Patient Goals and CMS Choice Patient states their goals for this hospitalization and ongoing recovery are:: to go home, continue [facility] services CMS Medicare.gov Compare Post Acute Care list provided to:: Patient Represenative (must comment) Choice offered to / list presented to : Adult Children  Expected Discharge Plan and Services Expected Discharge Plan: [place] arrangements for the past 2 months: Single Family Home                                      Prior Living Arrangements/Services Living arrangements for the past 2 months: Single Family Home Lives with:: Self Patient language and need for interpreter reviewed:: Yes Do you feel safe going back to the place where you live?: Yes      Need for Family Participation in Patient Care: Yes  (Comment) Care giver support system in place?: Yes (comment)(adult children)   Criminal Activity/Legal Involvement Pertinent to Current Situation/Hospitalization: No - Comment as needed  Activities of Daily Living      Permission Sought/Granted Permission sought to share information with : Facility Art therapist granted to share information with : Yes, Verbal Permission Granted  Share Information with NAME: Rosalyn & Piercen     Permission granted to share info w Relationship: children  Permission granted to share info w Contact Information: [phone]  Emotional Assessment Appearance:: Other (Comment Required(unable to assess) Attitude/Demeanor/Rapport: Unable to Assess Affect (typically observed): Unable to Assess Orientation: : Oriented to Self Alcohol / Substance Use: Never Used Psych Involvement: No (comment)  Admission diagnosis:  UTI (urinary tract infection) [N39.0] Urinary tract infection in elderly patient [N39.0] Patient Active Problem List   Diagnosis Date Noted  . UTI (urinary tract infection) 11/04/2019  . AMS (altered mental status) 09/24/2019  . CKD (chronic kidney disease) stage 4, GFR 15-29 ml/min (HCC) 09/24/2019  . Anemia 09/24/2019  . OSA (obstructive sleep apnea) 09/24/2019  . Seizure ([place]) 01/03/2019  . Acute CHF (congestive heart failure) ([place]) 12/25/2018  . Acute renal failure (ARF) ([place]) 11/21/2018  . AKI (acute kidney injury) ([place])   .  Goals of care, counseling/discussion   . Palliative care by specialist   . DNR (do not resuscitate) discussion   . Seizure-like activity ([place]) 11/05/2018  . Allergic rhinitis 07/24/2018  . Cardiac pacemaker in situ 07/24/2018  . History of sleep apnea 07/24/2018  . Macular degeneration 07/24/2018  . Trigeminal neuralgia pain 01/11/2018  . TMJ pain dysfunction syndrome 01/11/2018  . Recurrent UTI 09/26/2017  . Urothelial carcinoma of bladder ([place]) 09/26/2017  . Pressure ulcer, stage II  ([place]) 09/12/2017  . SIRS (systemic inflammatory response syndrome) ([place]) 09/10/2017  . Chronic cystitis 08/25/2017  . Sepsis ([place]) 06/10/2017  . CAP (community acquired pneumonia) 06/10/2017  . Infection due to enterococcus 06/07/2017  . Blood in stool   . Hemorrhage of rectum and anus   . Lower GI bleed 10/20/2016  . Near syncope   . Acute renal failure superimposed on chronic kidney disease ([place])   . Pneumonia 10/09/2016  . Aspiration pneumonia ([place]) 10/09/2016  . Acute respiratory failure ([place]) 10/09/2016  . Hydronephrosis, bilateral 07/19/2016  . Spinal stenosis of lumbar region with neurogenic claudication 07/19/2016  . AAA (abdominal aortic aneurysm) without rupture ([place]) 04/27/2016  . Arthritis, degenerative 03/31/2016  . A-fib ([place]) 03/31/2016  . HLD (hyperlipidemia) 03/31/2016  . CA of prostate ([place]) 03/31/2016  . Central perforation of tympanic membrane of right ear 03/26/2016  . 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 Presbycusis of left ear with restricted hearing of right ear 03/01/2016  . Chronic kidney disease, stage 3 (moderate) 10/30/2015  . Episode of unresponsiveness 10/30/2015  . Sick sinus syndrome ([place]) 10/30/2015  . Thrombocytopenia, unspecified ([place]) 07/24/2015  . Fitting or adjustment of cardiac pacemaker 05/09/2015  . SVT (supraventricular tachycardia) ([place]) 04/09/2015  . Diabetes mellitus, type 2 ([place]) 04/09/2015  . Benign hypertension 04/09/2015  . Acute myocardial infarction, initial episode of care ([place]) 04/09/2015  . Phimosis 01/30/2014  . Bladder outlet obstruction 04/18/2013  . Dysuria 04/18/2013  . Gross hematuria 04/18/2013  . Incomplete emptying of bladder 11/26/2012  . Personal history of prostate cancer 11/26/2012   PCP:  Adin Hector, MD Pharmacy:   [place], [address] [place] [address] Phone:  [phone] Fax: [place], [place] - [address] [place] [address] Phone: [phone] Fax: [phone]  [place], [place] - [place] [address] Phone: [phone] Fax: [phone]  [id number] - [place], [place] - [address] [place] [address] Phone: [phone] Fax: [phone]     Social Determinants of Health (SDOH) Interventions    Readmission Risk Interventions Readmission Risk Prevention Plan 09/25/2019 12/30/2018 12/25/2018  Transportation Screening Complete Complete Complete  PCP or Specialist Appt within 3-5 Days Complete - -  HRI or Home Care Consult Complete - Complete  Social Work Consult for Recovery Care Planning/Counseling Complete - Complete  Palliative Care Screening Not Applicable - -  Medication Review (RN Care Manager) Referral to Pharmacy Complete Complete  HRI or [place] - Complete -  Some recent data might be hidden

## 2019-11-04 NOTE — Progress Notes (Signed)
Pharmacy Antibiotic Note  Blake [name] is a 86 y.o. male admitted on 11/03/2019 with sepsis s/t pyelonephritis s/t nephrostomy tubes?.  Pharmacy has been consulted for vancomycin dosing.  Plan: Patient received vanc 1.75g IV load and ceftriaxone 1g IV x 1 in ED.  Vancomycin 750 mg IV Q 48 hrs. Goal AUC 400-550. Expected AUC: 403.0 SCr used: 3.95 Cssmin: 11.5  Patient has a h/o AKI on CKD (baseline 3.7 - 3.8) currently at 3.95. Will continue to monitor and switch to dosing per levels if renal function worsens.  Height: 6\' 2"  (188 cm) Weight: 83.7 kg (184 lb 8.4 oz) IBW/kg (Calculated) : 82.2  Temp (24hrs), Avg:97.6 F (36.4 C), Min:97.1 F (36.2 C), Max:98.1 F (36.7 C)  Recent Labs  Lab 11/03/19 2019  WBC 6.2  CREATININE 3.95*    Estimated Creatinine Clearance: 13.3 mL/min (A) (by C-G formula based on SCr of 3.95 mg/dL (H)).    Allergies  Allergen Reactions  . Cefepime     seizure  . Diovan [Valsartan] Cough  . Gabapentin Other (See Comments)    Sedation at all doses  . Hydralazine Itching  . Lisinopril Cough  . Pregabalin Other (See Comments)    Sedation at all doses  . Levofloxacin Other (See Comments)    Too many side effects. Nausea, vomiting, upset stomach, increased confusion, etc Other reaction(s): Confusion Nausea, chills Nausea, chills   . Sulfamethoxazole-Trimethoprim Other (See Comments)    Too many side effects. Nausea, vomiting, upset stomach, increased confusion, etc    Thank you for allowing pharmacy to be a part of this patient's care.  Tobie Lords, PharmD, BCPS Clinical Pharmacist 11/04/2019 2:28 AM

## 2019-11-04 NOTE — Progress Notes (Signed)
[facility] [place] NAME: Blake [name]    MR#:  [id number]  DATE OF BIRTH:  04-09-1923  SUBJECTIVE:  in the day. He was wide awake alert answer most of my questions appropriately. He knows he is in the hospital brought in by his family. Denies any pain. Did eat some breakfast. REVIEW OF SYSTEMS:   Review of Systems  Constitutional: Negative for chills, fever and weight loss.  HENT: Negative for ear discharge, ear pain and nosebleeds.   Eyes: Negative for blurred vision, pain and discharge.  Respiratory: Negative for sputum production, shortness of breath, wheezing and stridor.   Cardiovascular: Negative for chest pain, palpitations, orthopnea and PND.  Gastrointestinal: Negative for abdominal pain, diarrhea, nausea and vomiting.  Genitourinary: Negative for frequency and urgency.  Musculoskeletal: Negative for back pain and joint pain.  Neurological: Negative for sensory change, speech change, focal weakness and weakness.  Psychiatric/Behavioral: Negative for depression and hallucinations. The patient is not nervous/anxious.    Tolerating Diet:yes Tolerating PT: ambulates at home using walker  DRUG ALLERGIES:   Allergies  Allergen Reactions  . Cefepime     seizure  . Diovan [Valsartan] Cough  . Gabapentin Other (See Comments)    Sedation at all doses  . Hydralazine Itching  . Lisinopril Cough  . Pregabalin Other (See Comments)    Sedation at all doses  . Levofloxacin Other (See Comments)    Too many side effects. Nausea, vomiting, upset stomach, increased confusion, etc Other reaction(s): Confusion Nausea, chills Nausea, chills   . Sulfamethoxazole-Trimethoprim Other (See Comments)    Too many side effects. Nausea, vomiting, upset stomach, increased confusion, etc    VITALS:  Blood pressure (!) 149/90, pulse 81, temperature 97.6 F (36.4 C), temperature source Oral, resp. rate 15, height 6\' 2"  (1.88 m), weight 83.7 kg, SpO2  100 %.  PHYSICAL EXAMINATION:   Physical Exam  GENERAL:  86 y.o.-year-old patient lying in the bed with no acute distress.  EYES: Pupils equal, round, reactive to light and accommodation. No scleral icterus.   HEENT: Head atraumatic, normocephalic. Oropharynx and nasopharynx clear.  NECK:  Supple, no jugular venous distention. No thyroid enlargement, no tenderness.  LUNGS: Normal breath sounds bilaterally, no wheezing, rales, rhonchi. No use of accessory muscles of respiration.  CARDIOVASCULAR: S1, S2 normal. No murmurs, rubs, or gallops.  ABDOMEN: Soft, nontender, nondistended. Bowel sounds present. No organomegaly or mass. Bilateral nephrectomy tubes+ draining clear urine EXTREMITIES: No cyanosis, clubbing or edema b/l.   DJD knees NEUROLOGIC: Cranial nerves II through XII are intact. No focal Motor or sensory deficits b/l.   PSYCHIATRIC:  patient is alert and oriented x 3.  SKIN: No obvious rash, lesion, or ulcer.   LABORATORY PANEL:  CBC Recent Labs  Lab 11/03/19 2019  WBC 6.2  HGB 10.9*  HCT 33.7*  PLT 86*    Chemistries  Recent Labs  Lab 11/03/19 2019 11/03/19 2019 11/04/19 0642  NA 132*   < > 135  K 3.8   < > 3.5  CL 91*   < > 95*  CO2 27   < > 27  GLUCOSE 181*   < > 130*  BUN 82*   < > 82*  CREATININE 3.95*   < > 3.69*  CALCIUM 8.5*   < > 8.4*  AST 19  --   --   ALT 13  --   --   ALKPHOS 79  --   --  BILITOT 0.9  --   --    < > = values in this interval not displayed.   Cardiac Enzymes No results for input(s): TROPONINI in the last 168 hours. RADIOLOGY:  CT ABDOMEN PELVIS WO CONTRAST  Result Date: 11/03/2019 CLINICAL DATA:  86 year old male with abdominal pain. EXAM: CT ABDOMEN AND PELVIS WITHOUT CONTRAST TECHNIQUE: Multidetector CT imaging of the abdomen and pelvis was performed following the standard protocol without IV contrast. COMPARISON:  CT abdomen pelvis dated 09/24/2019 FINDINGS: Evaluation of this exam is limited in the absence of intravenous  contrast. Lower chest: Partially visualized small right pleural effusion with associated right lung base atelectasis or infiltrate, similar to prior CT. There is mild cardiomegaly. Coronary vascular calcification and partially visualized pacemaker wire. No intra-abdominal free air or free fluid. Hepatobiliary: Subcentimeter right hepatic hypodense focus is too small to characterize. The liver is otherwise unremarkable. No intrahepatic biliary ductal dilatation. No calcified gallstone or pericholecystic fluid. Pancreas: Unremarkable. No pancreatic ductal dilatation or surrounding inflammatory changes. Spleen: Normal in size without focal abnormality. Adrenals/Urinary Tract: The adrenal glands are unremarkable. Bilateral percutaneous nephrostomies tubes. The pigtail tip of the right nephrostomy tube is in the inferior pole collecting system and the left infra ostomy tube in the region of the renal pelvis. Interval improvement of the previously seen right hydronephrosis with minimal residual right hydronephrosis. There is no hydronephrosis on the left. No stone identified within the kidneys. The visualized ureters appear unremarkable. The urinary bladder is minimally distended. There is trabeculated appearance of the bladder wall, likely related to chronic bladder outlet obstruction or chronic infection. Correlation with urinalysis recommended to exclude cystitis. Stomach/Bowel: There is severe colonic diverticulosis without active inflammatory changes. There is a small hiatal hernia. There is no bowel obstruction or active inflammation. The appendix is not visualized with certainty. No inflammatory changes identified in the right lower quadrant. Vascular/Lymphatic: There is advanced aortoiliac atherosclerotic disease. There is a 5.4 cm infrarenal abdominal aortic aneurysm as seen previously. Aneurysmal dilatation of the iliac arteries. The IVC is unremarkable. No portal venous gas. There is no adenopathy.  Reproductive: Prostate brachytherapy seeds. Other: Small fat containing umbilical hernia. No inflammatory changes or fluid collection. Musculoskeletal: Osteopenia with extensive degenerative changes of the spine. No acute osseous pathology. IMPRESSION: 1. Bilateral percutaneous nephrostomies with interval improvement of the previously seen right hydronephrosis with minimal residual right hydronephrosis. 2. Severe colonic diverticulosis. No bowel obstruction. 3. A 5.4 cm infrarenal abdominal aortic aneurysm as seen previously. 4. Small right pleural effusion with associated right lung base atelectasis or infiltrate, similar to prior CT. 5. Aortic Atherosclerosis (ICD10-I70.0). Electronically Signed   By: Anner Crete M.D.   On: 11/03/2019 21:15   ASSESSMENT AND PLAN:  Blake [name] is a 86 y.o. male with medical history significant of prostate cancer s/p radiation therapy with chronic hydronephrosis with bilateral nephrostomy tubes, sick sinus syndrome s/p pacemaker, CKD, OSA, AAA, atrial fibrillation not on anticoagulation who presents emergency department for abdominal pain, increased thirst, and confusion  1. Recurrent UTI - 2/2bilateral  nephrostomy tubes as portal of infection. -- Patient has history of prostate cancer that is post radiation therapy with chronic hydronephrosis requiring bilateral nephrectomy tubes - Per son infections seem more frequent with a q8 week replacement schedule. - Last infectious organism was MRSA in march 2021. - Unfortunately patient had some rxn to Septra.  -Now with recurrent symptoms - caught earlier than last infection. -IV abx with Vancomycin due to last infection with MRSA -  pharmacy to dose. - ID consultation placed to see pt for recurrent UTI -UC from 4/24 sent--results pending -pt afebrile, wbc 6.2, mentation appears at baseline--answers most questions appropriately -- patient had bilateral nephrectomy tubes exchanged on September 19, 2019 by IR  2.  DM-Type 2 wellcontrolled with h/o CKD-IV  - takes Januvia at home -generally well controlled. - Ss coverage  3. CKD-IV   stable  4. HTN - stable, continue home meds  5. Code status - The son, with patient concurrence wants full code  6. Disposition - should be able to return home with [facility] care. TOC consult placed  DVT prophylaxis: lovenox  Code Status: full code  Family Communication: Son in the room  Understands Dx and Tx plan.  Disposition Plan: Home in 48-72 hours with HH support. Consults called:ID Admission status: inpatient    Status is: Inpatient  Remains inpatient appropriate because:IV treatments appropriate due to intensity of illness or inability to take PO   Dispo: The patient is from: Home              Anticipated d/c is to: Home              Anticipated d/c date is: 2 days              Patient currently is not medically stable to d/c.Need to have ID see to decide on treatment for UTI   TOTAL TIME TAKING CARE OF THIS PATIENT: *35* minutes.  >50% time spent on counselling and coordination of care  Note: This dictation was prepared with Dragon dictation along with smaller phrase technology. Any transcriptional errors that result from this process are unintentional.  Fritzi Mandes M.D    [facility]   CC: Primary care physician; Adin Hector, MDPatient ID: Blake [name], male   DOB: 1922/10/22, 86 y.o.   MRN: [id number]

## 2019-11-04 NOTE — Progress Notes (Signed)
Patient has been assigned this RN at this time. ED called for report. Spoke to [employer]

## 2019-11-04 NOTE — ED Notes (Signed)
Report attempted to floor x1.

## 2019-11-04 NOTE — Progress Notes (Signed)
Patient admitted to room 103. Arrived to unit lethargic with delayed response. Oriented to self only. Patient has pacemaker, bilateral nephrostomy tubes, skin breakdown and DTI to right heel. Wound care consult placed. Patient is receiving IV/ABT for UTI. Bed alarm in place and functioning with difficulties noted. Unable to complete admission assessment due to cognitive function. Will continue to monitor and endorse.

## 2019-11-04 NOTE — Progress Notes (Signed)
Physical Therapy Evaluation Patient Details Name: Blake [name] MRN: [id number] DOB: 1922-10-21 Today's Date: 11/04/2019   History of Present Illness  Per MD note:Blake [name] is a 86 y.o. male with medical history significant of prostate cancer s/p radiation therapy with chronic hydronephrosis with bilateral nephrostomy tubes, sick sinus syndrome s/p pacemaker, CKD, OSA, AAA, atrial fibrillation not on anticoagulation who presents emergency department for abdominal pain, increased thirst, and confusion  Clinical Impression  Patient agrees to PT eval. He has 3/5 LLE hip flex strength, and 0/5 RLE strength and reports high pain with RLE movement. He needs max A with bed mobility for rolling and supine <> sit. He has wounds on B heels and has feet in heel protective boots. Patient does not tolerate movement to RLE and is limited in functional mobility due to pain.   Patient will continue to benefit from skilled PT to improve mobility and strength.    Follow Up Recommendations SNF    Equipment Recommendations  Rolling walker with 5" wheels    Recommendations for Other Services       Precautions / Restrictions Restrictions Weight Bearing Restrictions: No      Mobility  Bed Mobility Overal bed mobility: Needs Assistance Bed Mobility: Rolling;Supine to Sit;Sit to Supine Rolling: Max assist   Supine to sit: Max assist Sit to supine: Max assist   General bed mobility comments: (poor tolerance due to R knee pain)  Transfers Overall transfer level: (unable)                  Ambulation/Gait Ambulation/Gait assistance: (unable)              Stairs            Wheelchair Mobility    Modified Rankin (Stroke Patients Only)       Balance                                             Pertinent Vitals/Pain Pain Assessment: Faces Faces Pain Scale: Hurts whole lot Pain Location: right knee Pain Descriptors / Indicators: Sharp Pain  Intervention(s): Limited activity within patient's tolerance    Home Living Family/patient expects to be discharged to:: Unsure                      Prior Function Level of Independence: Independent with assistive device(s)(unsure; patient reports that he was walking at home)               Hand Dominance        Extremity/Trunk Assessment   Upper Extremity Assessment Upper Extremity Assessment: Defer to OT evaluation    Lower Extremity Assessment Lower Extremity Assessment: RLE deficits/detail;LLE deficits/detail RLE: (0/5 hip and knee due to pain) LLE: (3/5 left hip flex)       Communication   Communication: No difficulties  Cognition Arousal/Alertness: Awake/alert Behavior During Therapy: WFL for tasks assessed/performed;Flat affect Overall Cognitive Status: Within Functional Limits for tasks assessed                                        General Comments      Exercises     Assessment/Plan    PT Assessment Patient needs continued PT services  PT Problem List Decreased strength;Decreased  range of motion;Decreased mobility;Pain       PT Treatment Interventions Gait training;Functional mobility training;Therapeutic activities;Therapeutic exercise    PT Goals (Current goals can be found in the Care Plan section)  Acute Rehab PT Goals Patient Stated Goal: no goals stated PT Goal Formulation: Patient unable to participate in goal setting Time For Goal Achievement: 11/18/19 Potential to Achieve Goals: Fair    Frequency Min 2X/week   Barriers to discharge        Co-evaluation               AM-PAC PT "6 Clicks" Mobility  Outcome Measure Help needed turning from your back to your side while in a flat bed without using bedrails?: Total Help needed moving from lying on your back to sitting on the side of a flat bed without using bedrails?: Total Help needed moving to and from a bed to a chair (including a wheelchair)?:  Total Help needed standing up from a chair using your arms (e.g., wheelchair or bedside chair)?: Total Help needed to walk in hospital room?: Total Help needed climbing 3-5 steps with a railing? : Total 6 Click Score: 6    End of Session Equipment Utilized During Treatment: Gait belt   Patient left: in bed;with bed alarm set Nurse Communication: Mobility status PT Visit Diagnosis: Muscle weakness (generalized) (M62.81);Difficulty in walking, not elsewhere classified (R26.2);Pain Pain - Right/Left: Right Pain - part of body: Knee    Time: 1120-1140 PT Time Calculation (min) (ACUTE ONLY): 20 min   Charges:   PT Evaluation $PT Eval Low Complexity: 1 Low            Alanson Puls, PT DPT 11/04/2019, 12:46 PM

## 2019-11-04 NOTE — H&P (Signed)
History and Physical    WLLIAM [name] WNI:[id number] DOB: 12-Mar-1923 DOA: 11/03/2019  PCP: Adin Hector, MD (Confirm with patient/family/NH records and if not entered, this has to be entered at [facility] point of entry) Patient coming from: home  I have personally briefly reviewed patient's old medical records in [place]  Chief Complaint: abdominal pain and confusion  HPI: Blake [name] is a 86 y.o. male with medical history significant of prostate cancer s/p radiation therapy with chronic hydronephrosis with bilateral nephrostomy tubes, sick sinus syndrome s/p pacemaker, CKD, OSA, AAA, atrial fibrillation not on anticoagulation who presents emergency department for abdominal pain, increased thirst, and confusion.  Per son at bedside patient has been complaining of vague abdominal discomfort over the last several days. Today he started exhibiting some confusion.  Asking his son to call his deceased siblings, for example. Also complaining that he has to urinate, even though he never complains of this. More thirsty than normal. Son noticed some blood in the nephrostomy tubes. Symptoms are all similar to when he was admitted in mid March for severe UTI.  UA at that time was positive for MRSA. (For level 3, the HPI must include 4+ descriptors: Location, Quality, Severity, Duration, Timing, Context, modifying factors, associated signs/symptoms and/or status of 3+ chronic problems.)  (Please avoid self-populating past medical history here) (The initial 2-3 lines should be focused and good to copy and paste in the HPI section of the daily progress note).  ED Course: Hemodynamically stable in the ED. Lab significant for Cr 3.95 (3.70 baseline), U/A with few bacteria, >50 WBC. He was given IV Vancomycin based on recent MRSA UTI. TRH consulted to admit patient for continued treatment.   Review of Systems: As per HPI otherwise 10 point review of systems negative.    Past Medical History:    Diagnosis Date  . AAA (abdominal aortic aneurysm) ([place])   . Arrhythmia   . CHF (congestive heart failure) ([place])   . Diabetes mellitus without complication ([place])   . GERD (gastroesophageal reflux disease)   . GI bleed   . Heart murmur   . Hyperlipidemia   . Hypertension   . Pacemaker   . Pneumonia   . Prostate cancer ([place])   . Sleep apnea     Past Surgical History:  Procedure Laterality Date  . FLEXIBLE SIGMOIDOSCOPY N/A 10/21/2016   Procedure: FLEXIBLE SIGMOIDOSCOPY;  Surgeon: [name], MD;  Location: [facility] ENDOSCOPY;  Service: Endoscopy;  Laterality: N/A;  . INSERT / REPLACE / REMOVE PACEMAKER    . IR NEPHROSTOGRAM LEFT THRU EXISTING ACCESS  01/16/2019  . IR NEPHROSTOMY EXCHANGE LEFT  02/06/2019  . IR NEPHROSTOMY EXCHANGE LEFT  04/12/2019  . IR NEPHROSTOMY EXCHANGE LEFT  06/01/2019  . IR NEPHROSTOMY EXCHANGE LEFT  07/20/2019  . IR NEPHROSTOMY EXCHANGE LEFT  09/19/2019  . IR NEPHROSTOMY EXCHANGE RIGHT  02/06/2019  . IR NEPHROSTOMY EXCHANGE RIGHT  04/12/2019  . IR NEPHROSTOMY EXCHANGE RIGHT  06/01/2019  . IR NEPHROSTOMY EXCHANGE RIGHT  07/20/2019  . IR NEPHROSTOMY EXCHANGE RIGHT  09/19/2019  . IR NEPHROSTOMY PLACEMENT RIGHT  01/01/2019  . JOINT REPLACEMENT    . NEPHROSTOMY Bilateral   . PACEMAKER INSERTION Left 04/14/2015   Procedure: INSERTION PACEMAKER;  Surgeon: Isaias Cowman, MD;  Location: [facility] ORS;  Service: Cardiovascular;  Laterality: Left;  . PROSTATE SURGERY    . VISCERAL ARTERY INTERVENTION N/A 10/22/2016   Procedure: Visceral Artery Intervention;  Surgeon: Algernon Huxley, MD;  Location:  [place] CV LAB;  Service: Cardiovascular;  Laterality: N/A;   Soc Hx - married 107 years! Two children, 1 son, 1 daughter, 4 grandchildren, 1 great-grand. 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 Lives in his own, self-built home, with his wife. Son is very attentive.   reports that he has quit smoking. He has never used smokeless tobacco. He reports that he does not drink alcohol or use  drugs.  Allergies  Allergen Reactions  . Cefepime     seizure  . Diovan [Valsartan] Cough  . Gabapentin Other (See Comments)    Sedation at all doses  . Hydralazine Itching  . Lisinopril Cough  . Pregabalin Other (See Comments)    Sedation at all doses  . Levofloxacin Other (See Comments)    Too many side effects. Nausea, vomiting, upset stomach, increased confusion, etc Other reaction(s): Confusion Nausea, chills Nausea, chills   . Sulfamethoxazole-Trimethoprim Other (See Comments)    Too many side effects. Nausea, vomiting, upset stomach, increased confusion, etc    Family History  Problem Relation Age of Onset  . Hypertension Other   . Stroke Other   . Heart attack Other   . Stroke Sister   . Heart attack Brother      Prior to Admission medications   Medication Sig Start Date End Date Taking? Authorizing Provider  azelastine (ASTELIN) 0.1 % nasal spray Place 1 spray into both nostrils 2 (two) times daily as needed for rhinitis or allergies.    [provider]  feeding supplement, GLUCERNA SHAKE, (GLUCERNA SHAKE) LIQD Take 237 mLs by mouth 2 (two) times daily between meals. 01/01/19   Fritzi Mandes, MD  finasteride (PROSCAR) 5 MG tablet Take 5 mg by mouth daily.    [provider]  Iron-Vitamin C (VITRON-C PO) Take 1 tablet by mouth daily.    [provider]  metoprolol tartrate 37.5 MG TABS Take 37.5 mg by mouth 2 (two) times daily. 09/13/17   Gladstone Lighter, MD  multivitamin-iron-minerals-folic acid (CENTRUM) chewable tablet Chew 2 tablets by mouth daily.    [provider]  pantoprazole (PROTONIX) 40 MG tablet Take 40 mg by mouth daily.    [provider]  rosuvastatin (CRESTOR) 5 MG tablet Take 5 mg by mouth at bedtime.    [provider]  sitaGLIPtin (JANUVIA) 50 MG tablet Take 0.5 tablets (25 mg total) by mouth daily. 01/01/19   Fritzi Mandes, MD  sodium bicarbonate 650 MG tablet Take 1 tablet (650 mg total) by  mouth 2 (two) times daily. 09/13/17   Gladstone Lighter, MD  tamsulosin (FLOMAX) 0.4 MG CAPS capsule Take 0.4 mg by mouth daily.     [provider]  torsemide (DEMADEX) 20 MG tablet Take 40 mg by mouth 2 (two) times daily.  03/29/19 03/28/20  [provider]    Physical Exam: Vitals:   11/03/19 2330 11/04/19 0000 11/04/19 0030 11/04/19 0100  BP: (!) 150/90 (!) 152/94 (!) 158/95 (!) 149/93  Pulse: 62 62 60 64  Resp:      Temp:      TempSrc:      SpO2: 99% 96% (!) 85% 100%  Weight:        Constitutional: NAD, calm, comfortable Vitals:   11/03/19 2330 11/04/19 0000 11/04/19 0030 11/04/19 0100  BP: (!) 150/90 (!) 152/94 (!) 158/95 (!) 149/93  Pulse: 62 62 60 64  Resp:      Temp:      TempSrc:  SpO2: 99% 96% (!) 85% 100%  Weight:       General:  Old man in no acute distress. Eyes: PERRL, lids and conjunctivae normal, arcus senilis ENMT: Mucous membranes are moist. Posterior pharynx clear of any exudate or lesions.Edentulous.  Neck: normal, supple, no masses, no thyromegaly Respiratory: clear to auscultation bilaterally, no wheezing, no crackles. Normal respiratory effort. No accessory muscle use. Intermittent wet cough - an old finding Cardiovascular: Regular rate and rhythm, sustained harsh holosystolic murmur best at RSB. No rubs / gallops. 2+ disltal lower extremity edema right. 1+ pedal pulses. No carotid bruits.  Abdomen: no tenderness, no masses palpated. No hepatosplenomegaly. Bowel sounds positive.  Musculoskeletal: no clubbing / cyanosis. No joint deformity upper and lower extremities. Good ROM, no contractures. Decreased muscle tone.  Skin: no rashes, lesions, ulcers. No induration. Multiple seborrheic keratosis upper chest/neck Neurologic: CN 2-12 grossly intact. Psychiatric:  Alert and oriented to person, place.  Normal mood.     Labs on Admission: I have personally reviewed following labs and imaging studies  CBC: Recent Labs  Lab  11/03/19 2019  WBC 6.2  HGB 10.9*  HCT 33.7*  MCV 83.8  PLT 86*   Basic Metabolic Panel: Recent Labs  Lab 11/03/19 2019  NA 132*  K 3.8  CL 91*  CO2 27  GLUCOSE 181*  BUN 82*  CREATININE 3.95*  CALCIUM 8.5*   GFR: Estimated Creatinine Clearance: 13.3 mL/min (A) (by C-G formula based on SCr of 3.95 mg/dL (H)). Liver Function Tests: Recent Labs  Lab 11/03/19 2019  AST 19  ALT 13  ALKPHOS 79  BILITOT 0.9  PROT 7.9  ALBUMIN 3.2*   Recent Labs  Lab 11/03/19 2019  LIPASE 34   No results for input(s): AMMONIA in the last 168 hours. Coagulation Profile: No results for input(s): INR, PROTIME in the last 168 hours. Cardiac Enzymes: No results for input(s): CKTOTAL, CKMB, CKMBINDEX, TROPONINI in the last 168 hours. BNP (last 3 results) No results for input(s): PROBNP in the last 8760 hours. HbA1C: No results for input(s): HGBA1C in the last 72 hours. CBG: No results for input(s): GLUCAP in the last 168 hours. Lipid Profile: No results for input(s): CHOL, HDL, LDLCALC, TRIG, CHOLHDL, LDLDIRECT in the last 72 hours. Thyroid Function Tests: No results for input(s): TSH, T4TOTAL, FREET4, T3FREE, THYROIDAB in the last 72 hours. Anemia Panel: No results for input(s): VITAMINB12, FOLATE, FERRITIN, TIBC, IRON, RETICCTPCT in the last 72 hours. Urine analysis:    Component Value Date/Time   COLORURINE YELLOW (A) 11/03/2019 2111   APPEARANCEUR CLOUDY (A) 11/03/2019 2111   APPEARANCEUR Cloudy (A) 03/05/2019 1629   LABSPEC 1.020 11/03/2019 2111   LABSPEC 1.029 12/29/2012 1554   PHURINE 5.5 11/03/2019 2111   GLUCOSEU 0 (A) 11/03/2019 2111   GLUCOSEU see comment 12/29/2012 1554   HGBUR >50 (A) 11/03/2019 2111   BILIRUBINUR 0 (A) 11/03/2019 2111   BILIRUBINUR Negative 03/05/2019 1629   BILIRUBINUR see comment 12/29/2012 1554   KETONESUR 0 (A) 11/03/2019 2111   PROTEINUR >300 (A) 11/03/2019 2111   NITRITE NEGATIVE 11/03/2019 2111   LEUKOCYTESUR SMALL (A) 11/03/2019 2111    LEUKOCYTESUR see comment 12/29/2012 1554    Radiological Exams on Admission: CT ABDOMEN PELVIS WO CONTRAST  Result Date: 11/03/2019 CLINICAL DATA:  86 year old male with abdominal pain. EXAM: CT ABDOMEN AND PELVIS WITHOUT CONTRAST TECHNIQUE: Multidetector CT imaging of the abdomen and pelvis was performed following the standard protocol without IV contrast. COMPARISON:  CT abdomen pelvis dated  09/24/2019 FINDINGS: Evaluation of this exam is limited in the absence of intravenous contrast. Lower chest: Partially visualized small right pleural effusion with associated right lung base atelectasis or infiltrate, similar to prior CT. There is mild cardiomegaly. Coronary vascular calcification and partially visualized pacemaker wire. No intra-abdominal free air or free fluid. Hepatobiliary: Subcentimeter right hepatic hypodense focus is too small to characterize. The liver is otherwise unremarkable. No intrahepatic biliary ductal dilatation. No calcified gallstone or pericholecystic fluid. Pancreas: Unremarkable. No pancreatic ductal dilatation or surrounding inflammatory changes. Spleen: Normal in size without focal abnormality. Adrenals/Urinary Tract: The adrenal glands are unremarkable. Bilateral percutaneous nephrostomies tubes. The pigtail tip of the right nephrostomy tube is in the inferior pole collecting system and the left infra ostomy tube in the region of the renal pelvis. Interval improvement of the previously seen right hydronephrosis with minimal residual right hydronephrosis. There is no hydronephrosis on the left. No stone identified within the kidneys. The visualized ureters appear unremarkable. The urinary bladder is minimally distended. There is trabeculated appearance of the bladder wall, likely related to chronic bladder outlet obstruction or chronic infection. Correlation with urinalysis recommended to exclude cystitis. Stomach/Bowel: There is severe colonic diverticulosis without active  inflammatory changes. There is a small hiatal hernia. There is no bowel obstruction or active inflammation. The appendix is not visualized with certainty. No inflammatory changes identified in the right lower quadrant. Vascular/Lymphatic: There is advanced aortoiliac atherosclerotic disease. There is a 5.4 cm infrarenal abdominal aortic aneurysm as seen previously. Aneurysmal dilatation of the iliac arteries. The IVC is unremarkable. No portal venous gas. There is no adenopathy. Reproductive: Prostate brachytherapy seeds. Other: Small fat containing umbilical hernia. No inflammatory changes or fluid collection. Musculoskeletal: Osteopenia with extensive degenerative changes of the spine. No acute osseous pathology. IMPRESSION: 1. Bilateral percutaneous nephrostomies with interval improvement of the previously seen right hydronephrosis with minimal residual right hydronephrosis. 2. Severe colonic diverticulosis. No bowel obstruction. 3. A 5.4 cm infrarenal abdominal aortic aneurysm as seen previously. 4. Small right pleural effusion with associated right lung base atelectasis or infiltrate, similar to prior CT. 5. Aortic Atherosclerosis (ICD10-I70.0). Electronically Signed   By: Anner Crete M.D.   On: 11/03/2019 21:15    EKG: Independently reviewed. Atrial flutter, RBBB, LAFB, no acute changes  Assessment/Plan Active Problems:   Recurrent UTI   Diabetes mellitus, type 2 (HCC)   Benign hypertension   A-fib (HCC)   CKD (chronic kidney disease) stage 4, GFR 15-29 ml/min (HCC)   UTI (urinary tract infection)  (please populate well all problems here in Problem List. (For example, if patient is on BP meds at home and you resume or decide to hold them, it is a problem that needs to be her. Same for CAD, COPD, HLD and so on)   1. Recurrent UTI - 2/2 nephrostomy tubes as portal of infection. Per son infections seem more frequent with a q8 week replacement schedule. Last infectious organism was MRSA.  Unfortunately patient had severe rxn to [facility]. Now with recurrent symptoms - caught earlier than last infection. Plan Med surg admit  IV abx with Vancomycin due to last infection with MRSA - pharmacy to dose.  Once stable and culture data back will change to oral abx  2. DM - generally well controlled. Plan Hold long acting oral meds  Ss coverage  3. CKD - stable  4. HTN - stable, continue home meds  5. Code status - discussed with son, including chances of success and the subsequent risks  associated with success. The son, with patient concurrence wants full code  6. Disposition - should be able to return home with [facility] care. TOC consult placed  DVT prophylaxis: lovenox  Code Status: full code  Family Communication: Son present during exam. Understands Dx and Tx plan.  Disposition Plan: Home in 48-72 hours with HH support. Consults called: none (with names) Admission status: inpatient (inpatient / obs / tele / medical floor / SDU)   Adella Hare MD [facility] Pager [phone]  If 7PM-7AM, please contact night-coverage [email or web address] Password [email or web address]  11/04/2019, 1:19 AM

## 2019-11-05 DIAGNOSIS — Z86711 Personal history of pulmonary embolism: Secondary | ICD-10-CM

## 2019-11-05 DIAGNOSIS — Z881 Allergy status to other antibiotic agents status: Secondary | ICD-10-CM

## 2019-11-05 DIAGNOSIS — I482 Chronic atrial fibrillation, unspecified: Secondary | ICD-10-CM | POA: Diagnosis not present

## 2019-11-05 DIAGNOSIS — D649 Anemia, unspecified: Secondary | ICD-10-CM

## 2019-11-05 DIAGNOSIS — N189 Chronic kidney disease, unspecified: Secondary | ICD-10-CM | POA: Diagnosis not present

## 2019-11-05 DIAGNOSIS — I714 Abdominal aortic aneurysm, without rupture: Secondary | ICD-10-CM | POA: Diagnosis not present

## 2019-11-05 DIAGNOSIS — Z923 Personal history of irradiation: Secondary | ICD-10-CM

## 2019-11-05 DIAGNOSIS — G9341 Metabolic encephalopathy: Secondary | ICD-10-CM | POA: Diagnosis not present

## 2019-11-05 DIAGNOSIS — N390 Urinary tract infection, site not specified: Secondary | ICD-10-CM | POA: Diagnosis not present

## 2019-11-05 DIAGNOSIS — I495 Sick sinus syndrome: Secondary | ICD-10-CM

## 2019-11-05 DIAGNOSIS — Z888 Allergy status to other drugs, medicaments and biological substances status: Secondary | ICD-10-CM

## 2019-11-05 DIAGNOSIS — Z8546 Personal history of malignant neoplasm of prostate: Secondary | ICD-10-CM

## 2019-11-05 DIAGNOSIS — Z9889 Other specified postprocedural states: Secondary | ICD-10-CM

## 2019-11-05 DIAGNOSIS — G9340 Encephalopathy, unspecified: Secondary | ICD-10-CM

## 2019-11-05 DIAGNOSIS — Z8551 Personal history of malignant neoplasm of bladder: Secondary | ICD-10-CM

## 2019-11-05 DIAGNOSIS — N184 Chronic kidney disease, stage 4 (severe): Secondary | ICD-10-CM | POA: Diagnosis not present

## 2019-11-05 DIAGNOSIS — Z87440 Personal history of urinary (tract) infections: Secondary | ICD-10-CM

## 2019-11-05 DIAGNOSIS — Z950 Presence of cardiac pacemaker: Secondary | ICD-10-CM

## 2019-11-05 DIAGNOSIS — Z936 Other artificial openings of urinary tract status: Secondary | ICD-10-CM

## 2019-11-05 DIAGNOSIS — Z87891 Personal history of nicotine dependence: Secondary | ICD-10-CM

## 2019-11-05 DIAGNOSIS — R011 Cardiac murmur, unspecified: Secondary | ICD-10-CM

## 2019-11-05 LAB — GLUCOSE, CAPILLARY
Glucose-Capillary: 126 mg/dL — ABNORMAL HIGH (ref 70–99)
Glucose-Capillary: 122 mg/dL — ABNORMAL HIGH (ref 70–99)

## 2019-11-05 LAB — URINE CULTURE: Culture: NO GROWTH

## 2019-11-05 MED ORDER — VANCOMYCIN HCL IN DEXTROSE 1-5 GM/200ML-% IV SOLN
1000.0000 mg | INTRAVENOUS | Status: DC
  Filled 2019-11-05: qty 200

## 2019-11-05 NOTE — Progress Notes (Signed)
Pharmacy Antibiotic Note  Blake [name] is a 86 y.o. male admitted on 11/03/2019 with sepsis s/t pyelonephritis s/t nephrostomy tubes.  Pharmacy has been consulted for vancomycin dosing.  Patient received vanc 1.75g IV load and ceftriaxone 1g IV x 1 in ED.  Plan: Will adjust dose to Vancomycin 1000 mg IV Q 48 hrs. Goal AUC 400-550. Expected AUC: 511  SCr used: 3.69 Cssmin: 14.3  Patient has with AKI on CKD (baseline 3.7 - 3.8) .  Will continue to monitor and switch to dosing per levels if renal function worsens.  Height: 6\' 2"  (188 cm) Weight: 83.7 kg (184 lb 8.4 oz) IBW/kg (Calculated) : 82.2  Temp (24hrs), Avg:98 F (36.7 C), Min:97.7 F (36.5 C), Max:98.2 F (36.8 C)  Recent Labs  Lab 11/03/19 2019 11/04/19 0642  WBC 6.2  --   CREATININE 3.95* 3.69*    Estimated Creatinine Clearance: 14.2 mL/min (A) (by C-G formula based on SCr of 3.69 mg/dL (H)).    Allergies  Allergen Reactions  . Cefepime     seizure  . Diovan [Valsartan] Cough  . Gabapentin Other (See Comments)    Sedation at all doses  . Hydralazine Itching  . Lisinopril Cough  . Pregabalin Other (See Comments)    Sedation at all doses  . Levofloxacin Other (See Comments)    Too many side effects. Nausea, vomiting, upset stomach, increased confusion, etc Other reaction(s): Confusion Nausea, chills Nausea, chills   . Sulfamethoxazole-Trimethoprim Other (See Comments)    Too many side effects. Nausea, vomiting, upset stomach, increased confusion, etc    Thank you for allowing pharmacy to be a part of this patient's care.  Pernell Dupre, PharmD, BCPS Clinical Pharmacist 11/05/2019 11:49 AM

## 2019-11-05 NOTE — Discharge Summary (Signed)
[facility] NAME: Blake [name]    MR#:  [id number]  DATE OF BIRTH:  03-Jan-1923  DATE OF ADMISSION:  11/03/2019 ADMITTING PHYSICIAN: Neena Rhymes, MD  DATE OF DISCHARGE: 11/05/2019  PRIMARY CARE PHYSICIAN: Adin Hector, MD    ADMISSION DIAGNOSIS:  UTI (urinary tract infection) [N39.0] Urinary tract infection in elderly patient [N39.0]  DISCHARGE DIAGNOSIS:  acute encephalopathy resolved acute on chronic renal failure stage IV SECONDARY DIAGNOSIS:   Past Medical History:  Diagnosis Date  . AAA (abdominal aortic aneurysm) ([place])   . Arrhythmia   . CHF (congestive heart failure) ([place])   . Diabetes mellitus without complication ([place])   . GERD (gastroesophageal reflux disease)   . GI bleed   . Heart murmur   . Hyperlipidemia   . Hypertension   . Pacemaker   . Pneumonia   . Prostate cancer ([place])   . Sleep apnea     HOSPITAL COURSE:  Blake [name] a 86 y.o.malewith medical history significant ofprostate cancer s/p radiation therapy with chronic hydronephrosis with bilateral nephrostomy tubes, sick sinus syndrome s/p pacemaker, CKD, OSA, AAA, atrial fibrillation not on anticoagulation who presents emergency department for abdominal pain, increased thirst, and confusion  1. Acute encephalopathy suspected due to acute on chronic renal failure with suspicion of UTI  -- 2/2bilateral  nephrostomy tubes as portal of infection. -- Patient has history of prostate cancer that is post radiation therapy with chronic hydronephrosis requiring bilateral nephrectomy tubes - Last infectious organism was MRSA in march 2021. -IV abx with Vancomycin due to last infection with MRSA now discontinued. - ID consultation placed to see pt for recurrent UTI -- appreciated. No indication for antibiotics once urine culture negative. Does not recommend chronic suppressive antibiotic. -UC from 4/24 sent-- negative -pt afebrile, wbc 6.2,  mentation appears at baseline--answers most questions appropriately -- patient had bilateral nephrostomy tubes exchanged on September 19, 2019 by IR draining well.  2. DM-Type 2 wellcontrolled with h/o CKD-IV  - takes Januvia at home -generally well controlled. -Ss coverage  3. CKD-IV   stable  4. HTN - stable, continue home meds  5. Code status - The son, with patient concurrence wants full code  6. Disposition - should be able to return home with [facility] care. TOC consult placed  Overall best at baseline. Discussed with Dr. Primus Bravo on the phone. Will discharge patient to home. Family in agreement.  DVT prophylaxis:lovenox Code Status:full code Family Communication:family on the phone  Understands Dx and Tx plan. Disposition Plan:Home with HH support. Will be resumed Consults called:ID Admission status:inpatient   Status is: Inpatient  Remains inpatient appropriate because:IV treatments appropriate due to intensity of illness or inability to take PO   Dispo: The patient is from: Home  Anticipated d/c is to: Home  Anticipated d/c date is: today  Patient currently is medically stable to d/c.  CONSULTS OBTAINED:  Treatment Team:  Tsosie Billing, MD  DRUG ALLERGIES:   Allergies  Allergen Reactions  . Cefepime     seizure  . Diovan [Valsartan] Cough  . Gabapentin Other (See Comments)    Sedation at all doses  . Hydralazine Itching  . Lisinopril Cough  . Pregabalin Other (See Comments)    Sedation at all doses  . Levofloxacin Other (See Comments)    Too many side effects. Nausea, vomiting, upset stomach, increased confusion, etc Other reaction(s): Confusion Nausea, chills Nausea, chills   . Sulfamethoxazole-Trimethoprim Other (  See Comments)    Too many side effects. Nausea, vomiting, upset stomach, increased confusion, etc    DISCHARGE MEDICATIONS:   Allergies as of 11/05/2019      Reactions   Cefepime     seizure   Diovan [valsartan] Cough   Gabapentin Other (See Comments)   Sedation at all doses   Hydralazine Itching   Lisinopril Cough   Pregabalin Other (See Comments)   Sedation at all doses   Levofloxacin Other (See Comments)   Too many side effects. Nausea, vomiting, upset stomach, increased confusion, etc Other reaction(s): Confusion Nausea, chills Nausea, chills   Sulfamethoxazole-trimethoprim Other (See Comments)   Too many side effects. Nausea, vomiting, upset stomach, increased confusion, etc      Medication List    STOP taking these medications   azelastine 0.1 % nasal spray Commonly known as: ASTELIN     TAKE these medications   feeding supplement (GLUCERNA SHAKE) Liqd Take 237 mLs by mouth 2 (two) times daily between meals.   finasteride 5 MG tablet Commonly known as: PROSCAR Take 5 mg by mouth daily.   Metoprolol Tartrate 37.5 MG Tabs Take 37.5 mg by mouth 2 (two) times daily.   multivitamin-iron-minerals-folic acid chewable tablet Chew 2 tablets by mouth daily.   PreserVision AREDS Caps Take 1 capsule by mouth 2 (two) times daily.   pantoprazole 40 MG tablet Commonly known as: PROTONIX Take 40 mg by mouth daily.   rosuvastatin 5 MG tablet Commonly known as: CRESTOR Take 5 mg by mouth at bedtime.   sitaGLIPtin 50 MG tablet Commonly known as: JANUVIA Take 0.5 tablets (25 mg total) by mouth daily.   sodium bicarbonate 650 MG tablet Take 1 tablet (650 mg total) by mouth 2 (two) times daily.   tamsulosin 0.4 MG Caps capsule Commonly known as: FLOMAX Take 0.4 mg by mouth daily.   torsemide 20 MG tablet Commonly known as: DEMADEX Take 40 mg by mouth 2 (two) times daily.   VITRON-C PO Take 1 tablet by mouth daily.       If you experience worsening of your admission symptoms, develop shortness of breath, life threatening emergency, suicidal or homicidal thoughts you must seek medical attention immediately by calling 911 or calling your MD  immediately  if symptoms less severe.  You Must read complete instructions/literature along with all the possible adverse reactions/side effects for all the Medicines you take and that have been prescribed to you. Take any new Medicines after you have completely understood and accept all the possible adverse reactions/side effects.   Please note  You were cared for by a hospitalist during your hospital stay. If you have any questions about your discharge medications or the care you received while you were in the hospital after you are discharged, you can call the unit and asked to speak with the hospitalist on call if the hospitalist that took care of you is not available. Once you are discharged, your primary care physician will handle any further medical issues. Please note that NO REFILLS for any discharge medications will be authorized once you are discharged, as it is imperative that you return to your primary care physician (or establish a relationship with a primary care physician if you do not have one) for your aftercare needs so that they can reassess your need for medications and monitor your lab values.  DATA REVIEW:   CBC  Recent Labs  Lab 11/03/19 2019  WBC 6.2  HGB 10.9*  HCT 33.7*  PLT 86*    Chemistries  Recent Labs  Lab 11/03/19 2019 11/03/19 2019 11/04/19 0642  NA 132*   < > 135  K 3.8   < > 3.5  CL 91*   < > 95*  CO2 27   < > 27  GLUCOSE 181*   < > 130*  BUN 82*   < > 82*  CREATININE 3.95*   < > 3.69*  CALCIUM 8.5*   < > 8.4*  AST 19  --   --   ALT 13  --   --   ALKPHOS 79  --   --   BILITOT 0.9  --   --    < > = values in this interval not displayed.    Microbiology Results   Recent Results (from the past 240 hour(s))  Urine culture     Status: None   Collection Time: 11/03/19  9:11 PM   Specimen: Urine, Random  Result Value Ref Range Status   Specimen Description   Final    URINE, RANDOM Performed at [facility], [address],  [place], [place] [address]    Special Requests   Final    NONE Performed at [facility], [address], [place], [place] [address]    Culture   Final    NO GROWTH Performed at [facility], [place] [address], [place], [place] [address]    Report Status 11/05/2019 FINAL  Final  Respiratory Panel by RT PCR (Flu A&B, Covid) - Nasopharyngeal Swab     Status: None   Collection Time: 11/03/19 10:45 PM   Specimen: Nasopharyngeal Swab  Result Value Ref Range Status   SARS Coronavirus 2 by RT PCR NEGATIVE NEGATIVE Final    Comment: (NOTE) SARS-CoV-2 target nucleic acids are NOT DETECTED. The SARS-CoV-2 RNA is generally detectable in upper respiratoy specimens during the acute phase of infection. The lowest concentration of SARS-CoV-2 viral copies this assay can detect is 131 copies/mL. A negative result does not preclude SARS-Cov-2 infection and should not be used as the sole basis for treatment or other patient management decisions. A negative result may occur with  improper specimen collection/handling, submission of specimen other than nasopharyngeal swab, presence of viral mutation(s) within the areas targeted by this assay, and inadequate number of viral copies (<131 copies/mL). A negative result must be combined with clinical observations, patient history, and epidemiological information. The expected result is Negative. Fact Sheet for Patients:  PinkCheek.be Fact Sheet for Healthcare Providers:  GravelBags.it This test is not yet ap proved or cleared by the [place] FDA and  has been authorized for detection and/or diagnosis of SARS-CoV-2 by FDA under an Emergency Use Authorization (EUA). This EUA will remain  in effect (meaning this test can be used) for the duration of the COVID-19 declaration under Section 564(b)(1) of the Act, 21 U.S.C. section 360bbb-3(b)(1), unless the authorization is terminated  or revoked sooner.    Influenza A by PCR NEGATIVE NEGATIVE Final   Influenza B by PCR NEGATIVE NEGATIVE Final    Comment: (NOTE) The Xpert Xpress SARS-CoV-2/FLU/RSV assay is intended as an aid in  the diagnosis of influenza from Nasopharyngeal swab specimens and  should not be used as a sole basis for treatment. Nasal washings and  aspirates are unacceptable for Xpert Xpress SARS-CoV-2/FLU/RSV  testing. Fact Sheet for Patients: PinkCheek.be Fact Sheet for Healthcare Providers: GravelBags.it This test is not yet approved or cleared by the [place] and  has been authorized for detection and/or diagnosis of SARS-CoV-2 by  FDA under an Emergency Use Authorization (EUA). This EUA will remain  in effect (meaning this test can be used) for the duration of the  Covid-19 declaration under Section 564(b)(1) of the Act, 21  U.S.C. section 360bbb-3(b)(1), unless the authorization is  terminated or revoked. Performed at [facility], [address] [place], [place] [address]     RADIOLOGY:  CT ABDOMEN PELVIS WO CONTRAST  Result Date: 11/03/2019 CLINICAL DATA:  86 year old male with abdominal pain. EXAM: CT ABDOMEN AND PELVIS WITHOUT CONTRAST TECHNIQUE: Multidetector CT imaging of the abdomen and pelvis was performed following the standard protocol without IV contrast. COMPARISON:  CT abdomen pelvis dated 09/24/2019 FINDINGS: Evaluation of this exam is limited in the absence of intravenous contrast. Lower chest: Partially visualized small right pleural effusion with associated right lung base atelectasis or infiltrate, similar to prior CT. There is mild cardiomegaly. Coronary vascular calcification and partially visualized pacemaker wire. No intra-abdominal free air or free fluid. Hepatobiliary: Subcentimeter right hepatic hypodense focus is too small to characterize. The liver is otherwise unremarkable. No intrahepatic  biliary ductal dilatation. No calcified gallstone or pericholecystic fluid. Pancreas: Unremarkable. No pancreatic ductal dilatation or surrounding inflammatory changes. Spleen: Normal in size without focal abnormality. Adrenals/Urinary Tract: The adrenal glands are unremarkable. Bilateral percutaneous nephrostomies tubes. The pigtail tip of the right nephrostomy tube is in the inferior pole collecting system and the left infra ostomy tube in the region of the renal pelvis. Interval improvement of the previously seen right hydronephrosis with minimal residual right hydronephrosis. There is no hydronephrosis on the left. No stone identified within the kidneys. The visualized ureters appear unremarkable. The urinary bladder is minimally distended. There is trabeculated appearance of the bladder wall, likely related to chronic bladder outlet obstruction or chronic infection. Correlation with urinalysis recommended to exclude cystitis. Stomach/Bowel: There is severe colonic diverticulosis without active inflammatory changes. There is a small hiatal hernia. There is no bowel obstruction or active inflammation. The appendix is not visualized with certainty. No inflammatory changes identified in the right lower quadrant. Vascular/Lymphatic: There is advanced aortoiliac atherosclerotic disease. There is a 5.4 cm infrarenal abdominal aortic aneurysm as seen previously. Aneurysmal dilatation of the iliac arteries. The IVC is unremarkable. No portal venous gas. There is no adenopathy. Reproductive: Prostate brachytherapy seeds. Other: Small fat containing umbilical hernia. No inflammatory changes or fluid collection. Musculoskeletal: Osteopenia with extensive degenerative changes of the spine. No acute osseous pathology. IMPRESSION: 1. Bilateral percutaneous nephrostomies with interval improvement of the previously seen right hydronephrosis with minimal residual right hydronephrosis. 2. Severe colonic diverticulosis. No bowel  obstruction. 3. A 5.4 cm infrarenal abdominal aortic aneurysm as seen previously. 4. Small right pleural effusion with associated right lung base atelectasis or infiltrate, similar to prior CT. 5. Aortic Atherosclerosis (ICD10-I70.0). Electronically Signed   By: Anner Crete M.D.   On: 11/03/2019 21:15     CODE STATUS:     Code Status Orders  (From admission, onward)         Start     Ordered   11/04/19 0119  Full code  Continuous     11/04/19 0118        Code Status History    Date Active Date Inactive Code Status Order ID Comments User Context   11/04/2019 0054 11/04/2019 0118 DNR [id number]  Neena Rhymes, MD ED   09/24/2019 2132 09/28/2019 2309 Full Code [id number]  Orene Desanctis, DO  ED   01/03/2019 0142 01/09/2019 2238 Full Code [id number]  Mansy, Arvella Merles, MD ED   12/25/2018 1233 01/02/2019 1950 Full Code [id number]  Dustin Flock, MD Inpatient   12/25/2018 0645 12/25/2018 1233 Full Code [id number]  Paulette Blanch, MD ED   11/21/2018 2217 11/29/2018 2052 Full Code [id number]  Mayer Camel, NP ED   11/05/2018 1755 11/09/2018 1728 Full Code [id number]  Mayo, Pete Pelt, MD Inpatient   09/10/2017 1430 09/13/2017 2115 Full Code [id number]  Idelle Crouch, MD Inpatient   06/10/2017 2304 06/12/2017 1908 Full Code [id number]  Lance Coon, MD Inpatient   05/17/2017 0829 05/19/2017 1525 Full Code [id number]  Hillary Bow, MD ED   03/31/2017 1334 04/03/2017 1611 Full Code [id number]  Nicholes Mango, MD Inpatient   10/21/2016 0155 10/25/2016 2145 Full Code [id number]  Hugelmeyer, [place], DO Inpatient   10/09/2016 2342 10/13/2016 2009 Full Code [id number]  Vaughan Basta, MD Inpatient   04/14/2015 1432 04/15/2015 1739 Full Code [id number]  Isaias Cowman, MD Inpatient   04/09/2015 0022 04/14/2015 1432 Full Code [id number]  Hower, Aaron Mose, MD ED   Advance Care Planning Activity       TOTAL TIME TAKING CARE OF THIS PATIENT: *40* minutes.    Fritzi Mandes M.D  [facility]    CC: Primary care  physician; Adin Hector, MD

## 2019-11-05 NOTE — Progress Notes (Signed)
Patient ID: Blake [name], male   DOB: 30-Jan-1923, 86 y.o.   MRN: [id number]  Discussed with Dr. Delaine Lame-- urine culture from 4/24 negative. No indication for any antibiotics. No indication for chronic suppressive antibiotic. With patient's daughter Blake [name] and son Blake [name] on the phone. They do understand some chronic bacteriuria it will be present secondary to nephrostomy tubes.  Patient will discharged to home. Family is agreeable.

## 2019-11-05 NOTE — Progress Notes (Signed)
[facility] [place] NAME: Blake [name]    MR#:  [id number]  DATE OF BIRTH:  09-Sep-1922  SUBJECTIVE:   He was wide awake alert answer most of my questions appropriately. He knows he is in the hospital brought in by his family. Denies any pain. Did eat some breakfast.  REVIEW OF SYSTEMS:   Review of Systems  Constitutional: Negative for chills, fever and weight loss.  HENT: Negative for ear discharge, ear pain and nosebleeds.   Eyes: Negative for blurred vision, pain and discharge.  Respiratory: Negative for sputum production, shortness of breath, wheezing and stridor.   Cardiovascular: Negative for chest pain, palpitations, orthopnea and PND.  Gastrointestinal: Negative for abdominal pain, diarrhea, nausea and vomiting.  Genitourinary: Negative for frequency and urgency.  Musculoskeletal: Negative for back pain and joint pain.  Neurological: Negative for sensory change, speech change, focal weakness and weakness.  Psychiatric/Behavioral: Negative for depression and hallucinations. The patient is not nervous/anxious.    Tolerating Diet:yes Tolerating PT: ambulates at home using walker  DRUG ALLERGIES:   Allergies  Allergen Reactions  . Cefepime     seizure  . Diovan [Valsartan] Cough  . Gabapentin Other (See Comments)    Sedation at all doses  . Hydralazine Itching  . Lisinopril Cough  . Pregabalin Other (See Comments)    Sedation at all doses  . Levofloxacin Other (See Comments)    Too many side effects. Nausea, vomiting, upset stomach, increased confusion, etc Other reaction(s): Confusion Nausea, chills Nausea, chills   . Sulfamethoxazole-Trimethoprim Other (See Comments)    Too many side effects. Nausea, vomiting, upset stomach, increased confusion, etc    VITALS:  Blood pressure (!) 153/82, pulse (!) 42, temperature 97.7 F (36.5 C), temperature source Oral, resp. rate 17, height 6\' 2"  (1.88 m), weight 83.7 kg, SpO2 97  %.  PHYSICAL EXAMINATION:   Physical Exam  GENERAL:  86 y.o.-year-old patient lying in the bed with no acute distress.  EYES: Pupils equal, round, reactive to light and accommodation. No scleral icterus.   HEENT: Head atraumatic, normocephalic. Oropharynx and nasopharynx clear.  NECK:  Supple, no jugular venous distention. No thyroid enlargement, no tenderness.  LUNGS: Normal breath sounds bilaterally, no wheezing, rales, rhonchi. No use of accessory muscles of respiration.  CARDIOVASCULAR: S1, S2 normal. No murmurs, rubs, or gallops.  ABDOMEN: Soft, nontender, nondistended. Bowel sounds present. No organomegaly or mass. Bilateral nephrectomy tubes+ draining clear urine EXTREMITIES: No cyanosis, clubbing or edema b/l.   DJD knees NEUROLOGIC: Cranial nerves II through XII are intact. No focal Motor or sensory deficits b/l.   PSYCHIATRIC:  patient is alert and oriented x 3.  SKIN: No obvious rash, lesion, or ulcer.   LABORATORY PANEL:  CBC Recent Labs  Lab 11/03/19 2019  WBC 6.2  HGB 10.9*  HCT 33.7*  PLT 86*    Chemistries  Recent Labs  Lab 11/03/19 2019 11/03/19 2019 11/04/19 0642  NA 132*   < > 135  K 3.8   < > 3.5  CL 91*   < > 95*  CO2 27   < > 27  GLUCOSE 181*   < > 130*  BUN 82*   < > 82*  CREATININE 3.95*   < > 3.69*  CALCIUM 8.5*   < > 8.4*  AST 19  --   --   ALT 13  --   --   ALKPHOS 79  --   --  BILITOT 0.9  --   --    < > = values in this interval not displayed.   Cardiac Enzymes No results for input(s): TROPONINI in the last 168 hours. RADIOLOGY:  CT ABDOMEN PELVIS WO CONTRAST  Result Date: 11/03/2019 CLINICAL DATA:  86 year old male with abdominal pain. EXAM: CT ABDOMEN AND PELVIS WITHOUT CONTRAST TECHNIQUE: Multidetector CT imaging of the abdomen and pelvis was performed following the standard protocol without IV contrast. COMPARISON:  CT abdomen pelvis dated 09/24/2019 FINDINGS: Evaluation of this exam is limited in the absence of intravenous  contrast. Lower chest: Partially visualized small right pleural effusion with associated right lung base atelectasis or infiltrate, similar to prior CT. There is mild cardiomegaly. Coronary vascular calcification and partially visualized pacemaker wire. No intra-abdominal free air or free fluid. Hepatobiliary: Subcentimeter right hepatic hypodense focus is too small to characterize. The liver is otherwise unremarkable. No intrahepatic biliary ductal dilatation. No calcified gallstone or pericholecystic fluid. Pancreas: Unremarkable. No pancreatic ductal dilatation or surrounding inflammatory changes. Spleen: Normal in size without focal abnormality. Adrenals/Urinary Tract: The adrenal glands are unremarkable. Bilateral percutaneous nephrostomies tubes. The pigtail tip of the right nephrostomy tube is in the inferior pole collecting system and the left infra ostomy tube in the region of the renal pelvis. Interval improvement of the previously seen right hydronephrosis with minimal residual right hydronephrosis. There is no hydronephrosis on the left. No stone identified within the kidneys. The visualized ureters appear unremarkable. The urinary bladder is minimally distended. There is trabeculated appearance of the bladder wall, likely related to chronic bladder outlet obstruction or chronic infection. Correlation with urinalysis recommended to exclude cystitis. Stomach/Bowel: There is severe colonic diverticulosis without active inflammatory changes. There is a small hiatal hernia. There is no bowel obstruction or active inflammation. The appendix is not visualized with certainty. No inflammatory changes identified in the right lower quadrant. Vascular/Lymphatic: There is advanced aortoiliac atherosclerotic disease. There is a 5.4 cm infrarenal abdominal aortic aneurysm as seen previously. Aneurysmal dilatation of the iliac arteries. The IVC is unremarkable. No portal venous gas. There is no adenopathy.  Reproductive: Prostate brachytherapy seeds. Other: Small fat containing umbilical hernia. No inflammatory changes or fluid collection. Musculoskeletal: Osteopenia with extensive degenerative changes of the spine. No acute osseous pathology. IMPRESSION: 1. Bilateral percutaneous nephrostomies with interval improvement of the previously seen right hydronephrosis with minimal residual right hydronephrosis. 2. Severe colonic diverticulosis. No bowel obstruction. 3. A 5.4 cm infrarenal abdominal aortic aneurysm as seen previously. 4. Small right pleural effusion with associated right lung base atelectasis or infiltrate, similar to prior CT. 5. Aortic Atherosclerosis (ICD10-I70.0). Electronically Signed   By: Anner Crete M.D.   On: 11/03/2019 21:15   ASSESSMENT AND PLAN:  Blake [name] is a 86 y.o. male with medical history significant of prostate cancer s/p radiation therapy with chronic hydronephrosis with bilateral nephrostomy tubes, sick sinus syndrome s/p pacemaker, CKD, OSA, AAA, atrial fibrillation not on anticoagulation who presents emergency department for abdominal pain, increased thirst, and confusion  1. Recurrent UTI - 2/2bilateral  nephrostomy tubes as portal of infection. -- Patient has history of prostate cancer that is post radiation therapy with chronic hydronephrosis requiring bilateral nephrectomy tubes - Last infectious organism was MRSA in march 2021. - Unfortunately patient had some rxn to Septra.  -Now with recurrent symptoms  -IV abx with Vancomycin due to last infection with MRSA - pharmacy to dose. - ID consultation placed to see pt for recurrent UTI and help with abxs -UC  from 4/24 sent--results pending -pt afebrile, wbc 6.2, mentation appears at baseline--answers most questions appropriately -- patient had bilateral nephrectomy tubes exchanged on September 19, 2019 by IR  2. DM-Type 2 wellcontrolled with h/o CKD-IV  - takes Januvia at home -generally well controlled. -  Ss coverage  3. CKD-IV   stable  4. HTN - stable, continue home meds  5. Code status - The son, with patient concurrence wants full code  6. Disposition - should be able to return home with [facility] care. TOC consult placed  DVT prophylaxis: lovenox  Code Status: full code  Family Communication: Son in the room  Understands Dx and Tx plan.  Tried to leave message for Dr Crisp--VM full Disposition Plan: Home in 48-72 hours with [facility] support. Consults called:ID Admission status: inpatient    Status is: Inpatient  Remains inpatient appropriate because:IV treatments appropriate due to intensity of illness or inability to take PO   Dispo: The patient is from: Home              Anticipated d/c is to: Home              Anticipated d/c date is: 2 days likely 4/27              Patient currently is not medically stable to d/c.Need to have ID see to decide on treatment for UTI   TOTAL TIME TAKING CARE OF THIS PATIENT: *30* minutes.  >50% time spent on counselling and coordination of care  Note: This dictation was prepared with Dragon dictation along with smaller phrase technology. Any transcriptional errors that result from this process are unintentional.  Fritzi Mandes M.D    [facility]   CC: Primary care physician; Adin Hector, MDPatient ID: [name], male   DOB: [birth year], 86 y.o.   MRN: [id number]

## 2019-11-05 NOTE — Consult Note (Signed)
NAME: Blake [name]  DOB: Feb 27, 1923  MRN: [id number]  Date/Time: 11/05/2019 10:33 AM  REQUESTING PROVIDER: Dr. Posey Pronto Subjective:  REASON FOR CONSULT: r/o uti ?Patient is a limited historian.  Chart reviewed.  No him from the previous admission in June 2020.  Blake [name] is a 86 y.o. male with a history of prostate cancer, bladder cancer and resected bladder tumors with bilateral hydroureteronephrosis since July 31, 2016 likely due to either chronic bladder outlet obstruction versus bilateral ureteral scarring from radiation for prostate cancer, recurrent UTI, CKD, PE sick sinus syndrome with cardiac pacemaker   was brought to the ED on 11/03/2019 with altered mental status.  As per the records he was also complaining of abdominal pain. Temperature was 97.1, blood pressure was 147/110, respiratory rate of 18, and heart rate of 91.   Labs revealed a hemoglobin of 10.9, WBC of 6.2, and platelet of 86.Creatinine was 3. 9 5( baseline around 4) UA showed more than 300 proteinuria, RBC of more than 50, WBC of more than 50, rare bacteria.   Patient in June 2020 had Pseudomonas bacteremia secondary to Pseudomonas UTI and was treated with meropenem.  During that hospitalization because of the bilateral hydronephrosis he has nephrostomy tubes placed. His nephrostomy tubes have been changed regularly.  His last exchange was 09/19/2019.  His left tube had inadvertently fallen off and it was replaced on 09/19/2019 and the right tube was also changed at that time by interventional radiologist.. Patient was hospitalized between 09/24/2019 to 09/28/2019 for for altered mental status and his urine culture was positive for MRSA.  Blood culture was negative for MRSA but 1 out of 4 had coag negative staph.  He was sent home on p.o. Bactrim.   Past Medical History:  Diagnosis Date  . AAA (abdominal aortic aneurysm) ([place])   . Arrhythmia   . CHF (congestive heart failure) ([place])   . Diabetes mellitus without  complication ([place])   . GERD (gastroesophageal reflux disease)   . GI bleed   . Heart murmur   . Hyperlipidemia   . Hypertension   . Pacemaker   . Pneumonia   . Prostate cancer ([place])   . Sleep apnea     Past Surgical History:  Procedure Laterality Date  . FLEXIBLE SIGMOIDOSCOPY N/A 10/21/2016   Procedure: FLEXIBLE SIGMOIDOSCOPY;  Surgeon: [name], MD;  Location: [facility] ENDOSCOPY;  Service: Endoscopy;  Laterality: N/A;  . INSERT / REPLACE / REMOVE PACEMAKER    . IR NEPHROSTOGRAM LEFT THRU EXISTING ACCESS  01/16/2019  . IR NEPHROSTOMY EXCHANGE LEFT  02/06/2019  . IR NEPHROSTOMY EXCHANGE LEFT  04/12/2019  . IR NEPHROSTOMY EXCHANGE LEFT  06/01/2019  . IR NEPHROSTOMY EXCHANGE LEFT  07/20/2019  . IR NEPHROSTOMY EXCHANGE LEFT  09/19/2019  . IR NEPHROSTOMY EXCHANGE RIGHT  02/06/2019  . IR NEPHROSTOMY EXCHANGE RIGHT  04/12/2019  . IR NEPHROSTOMY EXCHANGE RIGHT  06/01/2019  . IR NEPHROSTOMY EXCHANGE RIGHT  07/20/2019  . IR NEPHROSTOMY EXCHANGE RIGHT  09/19/2019  . IR NEPHROSTOMY PLACEMENT RIGHT  01/01/2019  . JOINT REPLACEMENT    . NEPHROSTOMY Bilateral   . PACEMAKER INSERTION Left 04/14/2015   Procedure: INSERTION PACEMAKER;  Surgeon: Isaias Cowman, MD;  Location: [facility] ORS;  Service: Cardiovascular;  Laterality: Left;  . PROSTATE SURGERY    . VISCERAL ARTERY INTERVENTION N/A 10/22/2016   Procedure: Visceral Artery Intervention;  Surgeon: Algernon Huxley, MD;  Location: [facility];  Service: Cardiovascular;  Laterality: N/A;    Social  History   Socioeconomic History  . Marital status: Married    Spouse name: Not on file  . Number of children: Not on file  . Years of education: Not on file  . Highest education level: Not on file  Occupational History  . Not on file  Tobacco Use  . Smoking status: Former Research scientist (life sciences)  . Smokeless tobacco: Never Used  Substance and Sexual Activity  . Alcohol use: No  . Drug use: No  . Sexual activity: Not Currently  Other Topics Concern  . Not on file   Social History Narrative  . Not on file   Social Determinants of Health   Financial Resource Strain:   . Difficulty of Paying Living Expenses:   Food Insecurity:   . Worried About Charity fundraiser in the Last Year:   . Arboriculturist in the Last Year:   Transportation Needs:   . Film/video editor (Medical):   Marland Kitchen Lack of Transportation (Non-Medical):   Physical Activity:   . Days of Exercise per Week:   . Minutes of Exercise per Session:   Stress:   . Feeling of Stress :   Social Connections:   . Frequency of Communication with Friends and Family:   . Frequency of Social Gatherings with Friends and Family:   . Attends Religious Services:   . Active Member of Clubs or Organizations:   . Attends Archivist Meetings:   Marland Kitchen Marital Status:   Intimate Partner Violence:   . Fear of Current or Ex-Partner:   . Emotionally Abused:   Marland Kitchen Physically Abused:   . Sexually Abused:     Family History  Problem Relation Age of Onset  . Hypertension Other   . Stroke Other   . Heart attack Other   . Stroke Sister   . Heart attack Brother    Allergies  Allergen Reactions  . Cefepime     seizure  . Diovan [Valsartan] Cough  . Gabapentin Other (See Comments)    Sedation at all doses  . Hydralazine Itching  . Lisinopril Cough  . Pregabalin Other (See Comments)    Sedation at all doses  . Levofloxacin Other (See Comments)    Too many side effects. Nausea, vomiting, upset stomach, increased confusion, etc Other reaction(s): Confusion Nausea, chills Nausea, chills   . Sulfamethoxazole-Trimethoprim Other (See Comments)    Too many side effects. Nausea, vomiting, upset stomach, increased confusion, etc    ? Current Facility-Administered Medications  Medication Dose Route Frequency Provider Last Rate Last Admin  . acetaminophen (TYLENOL) tablet 650 mg  650 mg Oral Q6H PRN Fritzi Mandes, MD   650 mg at 11/04/19 1216  . diclofenac Sodium (VOLTAREN) 1 % topical gel 2 g  2  g Topical BID PRN Fritzi Mandes, MD   2 g at 11/04/19 2004  . feeding supplement (GLUCERNA SHAKE) (GLUCERNA SHAKE) liquid 237 mL  237 mL Oral BID BM Norins, Heinz Knuckles, MD   237 mL at 11/04/19 1501  . heparin injection 5,000 Units  5,000 Units Subcutaneous Q8H Norins, Heinz Knuckles, MD   5,000 Units at 11/05/19 0645  . insulin aspart (novoLOG) injection 0-9 Units  0-9 Units Subcutaneous TID WC Norins, Heinz Knuckles, MD   1 Units at 11/05/19 0845  . metoprolol tartrate (LOPRESSOR) tablet 37.5 mg  37.5 mg Oral BID Neena Rhymes, MD   37.5 mg at 11/04/19 2244  . pantoprazole (PROTONIX) EC tablet 40 mg  40 mg Oral  Daily Norins, Heinz Knuckles, MD   40 mg at 11/04/19 [phone]  . rosuvastatin (CRESTOR) tablet 5 mg  5 mg Oral QHS Norins, Heinz Knuckles, MD   5 mg at 11/04/19 2234  . sodium bicarbonate tablet 650 mg  650 mg Oral BID Neena Rhymes, MD   650 mg at 11/04/19 2234  . sodium chloride flush (NS) 0.9 % injection 3 mL  3 mL Intravenous Once Lilia Pro., MD      . tamsulosin [facility]) capsule 0.4 mg  0.4 mg Oral Daily Norins, Heinz Knuckles, MD   0.4 mg at 11/04/19 0839  . torsemide (DEMADEX) tablet 40 mg  40 mg Oral BID Neena Rhymes, MD   40 mg at 11/05/19 0845  . [START ON 11/06/2019] vancomycin (VANCOREADY) IVPB 750 mg/150 mL  750 mg Intravenous Q48H Norins, Heinz Knuckles, MD         Abtx:  Anti-infectives (From admission, onward)   Start     Dose/Rate Route Frequency Ordered Stop   11/06/19 0000  vancomycin (VANCOREADY) IVPB 750 mg/150 mL     750 mg 150 mL/hr over 60 Minutes Intravenous Every 48 hours 11/04/19 0226     11/03/19 2345  cefTRIAXone (ROCEPHIN) 1 g in sodium chloride 0.9 % 100 mL IVPB     1 g 200 mL/hr over 30 Minutes Intravenous  Once 11/03/19 2335 11/04/19 0400   11/03/19 2345  vancomycin (VANCOREADY) IVPB 1750 mg/350 mL     1,750 mg 175 mL/hr over 120 Minutes Intravenous  Once 11/03/19 2336 11/04/19 0232      REVIEW OF SYSTEMS:  Const: negative fever, negative chills, negative weight  loss Eyes: negative diplopia or visual changes, negative eye pain ENT: negative coryza, negative sore throat Resp: negative cough, hemoptysis, dyspnea Cards: negative for chest pain, palpitations, lower extremity edema GU: Has 2 nephrostomies GI: He does not complain of abdominal pain or flank pain Skin: negative for rash and pruritus Heme: negative for easy bruising and gum/nose bleeding MS: Nonambulant Neurolo: Memory problems Psych: negative for feelings of anxiety, depression  Endocrine: negative for thyroid, diabetes Allergy/Immunology-as noted above Objective:  VITALS:  BP (!) 153/82 (BP Location: Right Arm)   Pulse (!) 42   Temp 97.7 F (36.5 C) (Oral)   Resp 17   Ht 6\' 2"  (1.88 m)   Wt 83.7 kg   SpO2 97%   BMI 23.69 kg/m  PHYSICAL EXAM:  General: Awake, , cooperative, no distress, responds to questions.  He is oriented to place, person.  Head: Normocephalic, without obvious abnormality, atraumatic. Eyes: Conjunctivae clear, anicteric sclerae. Pupils are equal ENT Nares normal. No drainage or sinus tenderness. Lips, mucosa, and tongue normal. No Thrush Neck: Supple, symmetrical, no adenopathy, thyroid: non tender no carotid bruit and no JVD. Back: Bilateral nephrostomy tubes in the flanks Lungs: Bilateral air entry Heart: 2 / 6 systolic murmur, irregular. Pacemaker site skin normal  abdomen: Soft, non-tender,not distended. Bowel sounds normal. No masses Extremities: atraumatic, no cyanosis. No edema. No clubbing Skin: No rashes or lesions. Or bruising Lymph: Cervical, supraclavicular normal. Neurologic: Grossly non-focal Pertinent Labs Lab Results CBC    Component Value Date/Time   WBC 6.2 11/03/2019 2019   RBC 4.02 (L) 11/03/2019 2019   HGB 10.9 (L) 11/03/2019 2019   HGB 11.2 (L) 06/16/2014 1337   HCT 33.7 (L) 11/03/2019 2019   HCT 34.8 (L) 06/16/2014 1337   PLT 86 (L) 11/03/2019 2019   PLT 150 06/16/2014 1337   MCV  83.8 11/03/2019 2019   MCV 89  06/16/2014 1337   MCH 27.1 11/03/2019 2019   MCHC 32.3 11/03/2019 2019   RDW 18.4 (H) 11/03/2019 2019   RDW 14.9 (H) 06/16/2014 1337   LYMPHSABS 0.5 (L) 09/24/2019 1530   LYMPHSABS 0.8 (L) 06/16/2014 1337   MONOABS 0.5 09/24/2019 1530   MONOABS 0.5 06/16/2014 1337   EOSABS 0.1 09/24/2019 1530   EOSABS 0.1 06/16/2014 1337   BASOSABS 0.0 09/24/2019 1530   BASOSABS 0.1 06/16/2014 1337    CMP Latest Ref Rng & Units 11/04/2019 11/03/2019 09/28/2019  Glucose 70 - 99 mg/dL 130(H) 181(H) 194(H)  BUN 8 - 23 mg/dL 82(H) 82(H) 95(H)  Creatinine 0.61 - 1.24 mg/dL 3.69(H) 3.95(H) 3.70(H)  Sodium 135 - 145 mmol/L 135 132(L) 137  Potassium 3.5 - 5.1 mmol/L 3.5 3.8 4.0  Chloride 98 - 111 mmol/L 95(L) 91(L) 96(L)  CO2 22 - 32 mmol/L 27 27 29   Calcium 8.9 - 10.3 mg/dL 8.4(L) 8.5(L) 8.6(L)  Total Protein 6.5 - 8.1 g/dL - 7.9 -  Total Bilirubin 0.3 - 1.2 mg/dL - 0.9 -  Alkaline Phos 38 - 126 U/L - 79 -  AST 15 - 41 U/L - 19 -  ALT 0 - 44 U/L - 13 -      Microbiology: Recent Results (from the past 240 hour(s))  Urine culture     Status: None   Collection Time: 11/03/19  9:11 PM   Specimen: Urine, Random  Result Value Ref Range Status   Specimen Description   Final    URINE, RANDOM Performed at [facility], [address] [place], [place] [address]    Special Requests   Final    NONE Performed at [facility], [address], [place], [place] [address]    Culture   Final    NO GROWTH Performed at [facility], [place]. [address], [place], [place] [address]    Report Status 11/05/2019 FINAL  Final  Respiratory Panel by RT PCR (Flu A&B, Covid) - Nasopharyngeal Swab     Status: None   Collection Time: 11/03/19 10:45 PM   Specimen: Nasopharyngeal Swab  Result Value Ref Range Status   SARS Coronavirus 2 by RT PCR NEGATIVE NEGATIVE Final    Comment: (NOTE) SARS-CoV-2 target nucleic acids are NOT DETECTED. The SARS-CoV-2 RNA is generally detectable in upper  respiratoy specimens during the acute phase of infection. The lowest concentration of SARS-CoV-2 viral copies this assay can detect is 131 copies/mL. A negative result does not preclude SARS-Cov-2 infection and should not be used as the sole basis for treatment or other patient management decisions. A negative result may occur with  improper specimen collection/handling, submission of specimen other than nasopharyngeal swab, presence of viral mutation(s) within the areas targeted by this assay, and inadequate number of viral copies (<131 copies/mL). A negative result must be combined with clinical observations, patient history, and epidemiological information. The expected result is Negative. Fact Sheet for Patients:  PinkCheek.be Fact Sheet for Healthcare Providers:  GravelBags.it This test is not yet ap proved or cleared by the [place] FDA and  has been authorized for detection and/or diagnosis of SARS-CoV-2 by FDA under an Emergency Use Authorization (EUA). This EUA will remain  in effect (meaning this test can be used) for the duration of the COVID-19 declaration under Section 564(b)(1) of the Act, 21 U.S.C. section 360bbb-3(b)(1), unless the authorization is terminated or revoked sooner.    Influenza A by  PCR NEGATIVE NEGATIVE Final   Influenza B by PCR NEGATIVE NEGATIVE Final    Comment: (NOTE) The Xpert Xpress SARS-CoV-2/FLU/RSV assay is intended as an aid in  the diagnosis of influenza from Nasopharyngeal swab specimens and  should not be used as a sole basis for treatment. Nasal washings and  aspirates are unacceptable for Xpert Xpress SARS-CoV-2/FLU/RSV  testing. Fact Sheet for Patients: PinkCheek.be Fact Sheet for Healthcare Providers: GravelBags.it This test is not yet approved or cleared by the [place] FDA and  has been authorized for  detection and/or diagnosis of SARS-CoV-2 by  FDA under an Emergency Use Authorization (EUA). This EUA will remain  in effect (meaning this test can be used) for the duration of the  Covid-19 declaration under Section 564(b)(1) of the Act, 21  U.S.C. section 360bbb-3(b)(1), unless the authorization is  terminated or revoked. Performed at [facility], [address] [place], [place] [address]     IMAGING RESULTS: The pigtail tip of the right nephrostomy tube is in the inferior pole collecting system and the left infra ostomy tube in the region of the renal pelvis. Interval improvement of the previously seen right hydronephrosis with minimal residual right hydronephrosis. There is no hydronephrosis on the left. No stone identified within the kidneys. The visualized ureters appear unremarkable. The urinary bladder is minimally distended. There is trabeculated appearance of the bladder wall, likely related to chronic bladder outlet obstruction or chronic infection I have personally reviewed the films ? Impression/Recommendation ? ?Patient with bladder cancer, prostate cancer status post radiation, bilateral ureteral obstruction from scarring versus bladder outlet obstruction causing bilateral chronic hydronephrosis.  Status post nephrostomy tube since June 2020.  Has had frequent changes and last change was 09/19/2019.  Admitted with altered mental status.  There was no fever or leukocytosis.  WBC was more than 50 in the urine which is expected because of the bilateral nephrostomy tubes.  His urine culture has been negative.  He recently was treated for MRSA urinary infection in March 2021.  Currently I do not think he has a urinary tract infection and he does not need any further antibiotics.  His presentation of encephalopathy could be from poor intake and dehydration.  Abdominal aortic aneurysm: 5.4 cm infrarenal aneurysm.  Has pacemaker  Anemia.  CKD  ID will sign off  call if needed. ? Discussed the management with Dr. Posey Pronto. ___________________________________________________  Note:  This document was prepared using Dragon voice recognition software and may include unintentional dictation errors.

## 2019-11-05 NOTE — Discharge Instructions (Signed)
Patient to keep up with his appointment in June for nephrostomy tube exchange by IR

## 2019-11-05 NOTE — Plan of Care (Signed)
Patient discharged to home via private vehicle.

## 2019-11-05 NOTE — TOC Transition Note (Signed)
Transition of Care [facility]) - CM/SW Discharge Note   Patient Details  Name: Blake [name] MRN: [id number] Date of Birth: 09-23-1922  Transition of Care [facility]) CM/SW Contact:  Shelbie Hutching, RN Phone Number: 11/05/2019, 1:25 PM   Clinical Narrative:    Patient is medically stable to discharge home today with family.  Patient is open with [facility] for home health RN, PT, and OT, aide will be added to the home health orders.  Drue Novel is aware that patient is discharging home today.  Patient's son [name] is coming to pick the patient up around 3 pm.     Final next level of care: [place] Barriers to Discharge: Barriers Resolved   Patient Goals and CMS Choice Patient states their goals for this hospitalization and ongoing recovery are:: to go home, continue [facility] services CMS Medicare.gov Compare Post Acute Care list provided to:: Patient Represenative (must comment) Choice offered to / list presented to : Adult Children(daughter and son)  Discharge Placement                       Discharge Plan and Services                  DME Agency: NA       HH Arranged: RN, PT, OT, Nurse's Aide [place] Agency: [facility]) Date [place]: 11/05/19 Time [place]: 56 Representative spoke with at [place]: [place] (SDOH) Interventions     Readmission Risk Interventions Readmission Risk Prevention Plan 09/25/2019 12/30/2018 12/25/2018  Transportation Screening Complete Complete Complete  PCP or Specialist Appt within 3-5 Days Complete - -  [place] or Home Care Consult Complete - Complete  Social Work Consult for [place] Planning/Counseling Complete - Complete  Palliative Care Screening Not Applicable - -  Medication Review Press photographer) Referral to Pharmacy Complete Complete  HRI or [place] - Complete -  Some recent data might be hidden

## 2019-11-08 ENCOUNTER — Telehealth: Payer: Self-pay | Admitting: Urology

## 2019-11-08 NOTE — Telephone Encounter (Signed)
Patient well known to urology, patient of Dr Dene Gentry with chronic bilateral hydronephrosis.  Daughter calling tonight reporting left NT fell all the way out. Denies fevers, chills, confusion, AMS, or bleeding.  With his chronic obstruction, I think it's reasonable to try and replace NT tmrw with IR as opposed to going to ER tonight. Discussed reasons for ER visit including fever, AMS, bleeding, cp, sob.  Will message Dr Bernardo Heater and Amy Heidl surgical scheduler to try to coordinate IR L NT replacement tomorrow in [place] or [place].  Blake Madrid, MD 11/08/2019

## 2019-11-09 ENCOUNTER — Other Ambulatory Visit: Payer: Self-pay

## 2019-11-09 ENCOUNTER — Ambulatory Visit
Admission: RE | Admit: 2019-11-09 | Discharge: 2019-11-09 | Disposition: A | Discharge status: Home / Self Care | Payer: Medicare PPO | Source: Ambulatory Visit | Attending: Urology | Admitting: Urology

## 2019-11-09 ENCOUNTER — Other Ambulatory Visit: Payer: Self-pay | Admitting: Radiology

## 2019-11-09 DIAGNOSIS — N1330 Unspecified hydronephrosis: Secondary | ICD-10-CM | POA: Diagnosis not present

## 2019-11-09 DIAGNOSIS — Z436 Encounter for attention to other artificial openings of urinary tract: Secondary | ICD-10-CM | POA: Diagnosis present

## 2019-11-09 HISTORY — PX: IR NEPHROSTOMY EXCHANGE RIGHT: IMG6070

## 2019-11-09 HISTORY — PX: IR NEPHROSTOMY EXCHANGE LEFT: IMG6069

## 2019-11-09 MED ORDER — CIPROFLOXACIN HCL 250 MG PO TABS
250.0000 mg | ORAL_TABLET | Freq: Two times a day (BID) | ORAL | 0 refills | Status: AC
Start: 2019-11-09 — End: 2019-11-12

## 2019-11-09 MED ORDER — IODIXANOL 320 MG/ML IV SOLN
50.0000 mL | Freq: Once | INTRAVENOUS | Status: AC | PRN
  Administered 2019-11-09: 15 mL

## 2019-11-09 NOTE — Procedures (Signed)
Interventional Radiology Procedure Note  Procedure:   Rescue of displaced left PCN.  Replaced with 38F PCN to gravity, with stitch Exchange of right PCN.  Replaced with 38F PCN to gravity.  With stitch  Complications: None  Recommendations:  - Short course of PO cipro on DC, given hx of his delayed bacteremia/sepsis after exchanges - Do not submerge - Routine care   Signed,  Dulcy Fanny. Earleen Newport, DO

## 2019-11-15 ENCOUNTER — Other Ambulatory Visit (HOSPITAL_COMMUNITY): Payer: Medicare PPO

## 2019-12-21 NOTE — Progress Notes (Signed)
Patient on schedule for bilateral nephrostomy tube exchange 12/28/2019,to be here @ 1000. Called and spoke with Dr Crisp/daughter with pre procedure instructions given. At last several  nephrostomy tube exchanges, patient not able to stand or assist with standing with increasing risk of fall and injury to patient. Encouraged daughter each  time that patient, for future appointments/procedures would be best served, and for safety of patient, to use non emergent EMS or [facility] which would provide transportation to and from his procedure. At that time daughter stated she would consider but not "really interested". When I called daughter today I informed her I had contacted [facility] to get more information and that they would be able to help her get her dad here and back to home safely. We talked at length regarding patient's safety since he is not able to stand to and from wheelchair nor get in and out of car safely. I gave her phone number to [place] in order to get plan in place. During conversation daughter stated she may not plan on having transportation to get father here but have friend's/ staff that care for patient at home to lift him. I at that time encouraged daughter that [facility] would be a safer choice, but daughter stated disinterest in this choice. Daughter made aware that RN's and staff here would not be manually lifting patient for safety reasons.

## 2019-12-28 ENCOUNTER — Telehealth: Payer: Self-pay | Admitting: Adult Health Nurse Practitioner

## 2019-12-28 ENCOUNTER — Ambulatory Visit
Admission: RE | Admit: 2019-12-28 | Discharge: 2019-12-28 | Disposition: A | Discharge status: Home / Self Care | Payer: Medicare PPO | Source: Ambulatory Visit | Attending: Urology | Admitting: Urology

## 2019-12-28 ENCOUNTER — Other Ambulatory Visit: Payer: Self-pay

## 2019-12-28 DIAGNOSIS — N1330 Unspecified hydronephrosis: Secondary | ICD-10-CM

## 2019-12-28 DIAGNOSIS — Z436 Encounter for attention to other artificial openings of urinary tract: Secondary | ICD-10-CM | POA: Insufficient documentation

## 2019-12-28 HISTORY — PX: IR NEPHROSTOMY EXCHANGE LEFT: IMG6069

## 2019-12-28 HISTORY — PX: IR NEPHROSTOMY EXCHANGE RIGHT: IMG6070

## 2019-12-28 MED ORDER — IODIXANOL 320 MG/ML IV SOLN
50.0000 mL | Freq: Once | INTRAVENOUS | Status: AC | PRN
  Administered 2019-12-28: 15 mL

## 2019-12-28 NOTE — Telephone Encounter (Signed)
Spoke with patient's daughter, Brunetta Genera, regarding Palliative services and answered all questions.  Daughter was still unsure as to how Palliative services would be beneficial to them.  I told daughter that I could email her the Palliative Brochure and after she reviewed this she could call me back to the let know if she had any further questions or if she would like to schedule the Consult, daughter was in agreement with this.

## 2019-12-28 NOTE — Progress Notes (Signed)
Patient post bilateral Nephrostomy tube exchange today, arrived on lift pad per daughter, post procedure discharged on lift pad. Vitals stable post procedure.

## 2019-12-28 NOTE — Procedures (Signed)
Interventional Radiology Procedure Note  Procedure: BILATERAL PCN EXCHG  Complications: None  Estimated Blood Loss: NONE  Findings: 12 FR TUBES EXCHG'D

## 2020-01-17 ENCOUNTER — Telehealth: Payer: Self-pay

## 2020-01-17 NOTE — Telephone Encounter (Signed)
Spoke with patient's daughter, Karie Kirks, to follow up regarding Palliative care referral. Discussed with Karie Kirks the role of Palliative care. Visit was scheduled for 02/08/20 @ 4pm. Verbal consent obtained

## 2020-01-22 ENCOUNTER — Other Ambulatory Visit: Payer: Self-pay | Admitting: Internal Medicine

## 2020-01-22 DIAGNOSIS — C679 Malignant neoplasm of bladder, unspecified: Secondary | ICD-10-CM

## 2020-01-22 DIAGNOSIS — N1330 Unspecified hydronephrosis: Secondary | ICD-10-CM

## 2020-01-22 DIAGNOSIS — R102 Pelvic and perineal pain: Secondary | ICD-10-CM

## 2020-01-23 ENCOUNTER — Encounter: Payer: Self-pay | Admitting: Internal Medicine

## 2020-01-23 ENCOUNTER — Other Ambulatory Visit: Payer: Self-pay

## 2020-01-23 ENCOUNTER — Inpatient Hospital Stay
Admission: EM | Admit: 2020-01-23 | Discharge: 2020-02-11 | DRG: 673 | Disposition: A | Discharge status: Skilled Nursing Facility | Payer: Medicare PPO | Attending: Internal Medicine | Admitting: Internal Medicine

## 2020-01-23 ENCOUNTER — Inpatient Hospital Stay: Payer: Medicare PPO

## 2020-01-23 ENCOUNTER — Emergency Department: Payer: Medicare PPO

## 2020-01-23 DIAGNOSIS — T148XXA Other injury of unspecified body region, initial encounter: Secondary | ICD-10-CM

## 2020-01-23 DIAGNOSIS — N390 Urinary tract infection, site not specified: Secondary | ICD-10-CM | POA: Diagnosis not present

## 2020-01-23 DIAGNOSIS — D696 Thrombocytopenia, unspecified: Secondary | ICD-10-CM | POA: Diagnosis not present

## 2020-01-23 DIAGNOSIS — R0902 Hypoxemia: Secondary | ICD-10-CM

## 2020-01-23 DIAGNOSIS — I714 Abdominal aortic aneurysm, without rupture: Secondary | ICD-10-CM | POA: Diagnosis present

## 2020-01-23 DIAGNOSIS — Z936 Other artificial openings of urinary tract status: Secondary | ICD-10-CM

## 2020-01-23 DIAGNOSIS — J9621 Acute and chronic respiratory failure with hypoxia: Secondary | ICD-10-CM | POA: Diagnosis present

## 2020-01-23 DIAGNOSIS — E43 Unspecified severe protein-calorie malnutrition: Secondary | ICD-10-CM | POA: Diagnosis not present

## 2020-01-23 DIAGNOSIS — Z950 Presence of cardiac pacemaker: Secondary | ICD-10-CM

## 2020-01-23 DIAGNOSIS — N189 Chronic kidney disease, unspecified: Secondary | ICD-10-CM

## 2020-01-23 DIAGNOSIS — J9601 Acute respiratory failure with hypoxia: Secondary | ICD-10-CM | POA: Diagnosis present

## 2020-01-23 DIAGNOSIS — L24A9 Irritant contact dermatitis due friction or contact with other specified body fluids: Secondary | ICD-10-CM

## 2020-01-23 DIAGNOSIS — E1122 Type 2 diabetes mellitus with diabetic chronic kidney disease: Secondary | ICD-10-CM | POA: Diagnosis not present

## 2020-01-23 DIAGNOSIS — L8993 Pressure ulcer of unspecified site, stage 3: Secondary | ICD-10-CM

## 2020-01-23 DIAGNOSIS — N2581 Secondary hyperparathyroidism of renal origin: Secondary | ICD-10-CM | POA: Diagnosis present

## 2020-01-23 DIAGNOSIS — I4891 Unspecified atrial fibrillation: Secondary | ICD-10-CM | POA: Diagnosis not present

## 2020-01-23 DIAGNOSIS — G4733 Obstructive sleep apnea (adult) (pediatric): Secondary | ICD-10-CM | POA: Diagnosis present

## 2020-01-23 DIAGNOSIS — C679 Malignant neoplasm of bladder, unspecified: Secondary | ICD-10-CM | POA: Diagnosis present

## 2020-01-23 DIAGNOSIS — G9349 Other encephalopathy: Secondary | ICD-10-CM | POA: Diagnosis present

## 2020-01-23 DIAGNOSIS — L89302 Pressure ulcer of unspecified buttock, stage 2: Secondary | ICD-10-CM

## 2020-01-23 DIAGNOSIS — I480 Paroxysmal atrial fibrillation: Secondary | ICD-10-CM | POA: Diagnosis not present

## 2020-01-23 DIAGNOSIS — D649 Anemia, unspecified: Secondary | ICD-10-CM | POA: Diagnosis present

## 2020-01-23 DIAGNOSIS — Z6824 Body mass index (BMI) 24.0-24.9, adult: Secondary | ICD-10-CM

## 2020-01-23 DIAGNOSIS — R000 Tachycardia, unspecified: Secondary | ICD-10-CM

## 2020-01-23 DIAGNOSIS — Z8546 Personal history of malignant neoplasm of prostate: Secondary | ICD-10-CM | POA: Diagnosis not present

## 2020-01-23 DIAGNOSIS — R531 Weakness: Secondary | ICD-10-CM | POA: Diagnosis not present

## 2020-01-23 DIAGNOSIS — Z515 Encounter for palliative care: Secondary | ICD-10-CM | POA: Diagnosis not present

## 2020-01-23 DIAGNOSIS — Z87891 Personal history of nicotine dependence: Secondary | ICD-10-CM | POA: Diagnosis not present

## 2020-01-23 DIAGNOSIS — Z20822 Contact with and (suspected) exposure to covid-19: Secondary | ICD-10-CM | POA: Diagnosis present

## 2020-01-23 DIAGNOSIS — I5033 Acute on chronic diastolic (congestive) heart failure: Secondary | ICD-10-CM | POA: Diagnosis not present

## 2020-01-23 DIAGNOSIS — I132 Hypertensive heart and chronic kidney disease with heart failure and with stage 5 chronic kidney disease, or end stage renal disease: Secondary | ICD-10-CM | POA: Diagnosis present

## 2020-01-23 DIAGNOSIS — I471 Supraventricular tachycardia, unspecified: Secondary | ICD-10-CM | POA: Diagnosis present

## 2020-01-23 DIAGNOSIS — R0602 Shortness of breath: Secondary | ICD-10-CM

## 2020-01-23 DIAGNOSIS — N184 Chronic kidney disease, stage 4 (severe): Secondary | ICD-10-CM | POA: Diagnosis not present

## 2020-01-23 DIAGNOSIS — I959 Hypotension, unspecified: Secondary | ICD-10-CM | POA: Diagnosis not present

## 2020-01-23 DIAGNOSIS — Z992 Dependence on renal dialysis: Secondary | ICD-10-CM | POA: Diagnosis not present

## 2020-01-23 DIAGNOSIS — E785 Hyperlipidemia, unspecified: Secondary | ICD-10-CM | POA: Diagnosis not present

## 2020-01-23 DIAGNOSIS — N186 End stage renal disease: Secondary | ICD-10-CM

## 2020-01-23 DIAGNOSIS — I495 Sick sinus syndrome: Secondary | ICD-10-CM | POA: Diagnosis present

## 2020-01-23 DIAGNOSIS — Z9981 Dependence on supplemental oxygen: Secondary | ICD-10-CM

## 2020-01-23 DIAGNOSIS — I5032 Chronic diastolic (congestive) heart failure: Secondary | ICD-10-CM

## 2020-01-23 DIAGNOSIS — L89303 Pressure ulcer of unspecified buttock, stage 3: Secondary | ICD-10-CM | POA: Diagnosis not present

## 2020-01-23 DIAGNOSIS — I472 Ventricular tachycardia: Secondary | ICD-10-CM | POA: Diagnosis not present

## 2020-01-23 DIAGNOSIS — R58 Hemorrhage, not elsewhere classified: Secondary | ICD-10-CM

## 2020-01-23 DIAGNOSIS — L89152 Pressure ulcer of sacral region, stage 2: Secondary | ICD-10-CM | POA: Diagnosis present

## 2020-01-23 DIAGNOSIS — Z7189 Other specified counseling: Secondary | ICD-10-CM | POA: Diagnosis not present

## 2020-01-23 DIAGNOSIS — I159 Secondary hypertension, unspecified: Secondary | ICD-10-CM | POA: Diagnosis not present

## 2020-01-23 DIAGNOSIS — J90 Pleural effusion, not elsewhere classified: Secondary | ICD-10-CM | POA: Diagnosis not present

## 2020-01-23 DIAGNOSIS — Z87440 Personal history of urinary (tract) infections: Secondary | ICD-10-CM

## 2020-01-23 DIAGNOSIS — I10 Essential (primary) hypertension: Secondary | ICD-10-CM | POA: Diagnosis present

## 2020-01-23 DIAGNOSIS — J918 Pleural effusion in other conditions classified elsewhere: Secondary | ICD-10-CM | POA: Diagnosis present

## 2020-01-23 DIAGNOSIS — K219 Gastro-esophageal reflux disease without esophagitis: Secondary | ICD-10-CM | POA: Diagnosis present

## 2020-01-23 DIAGNOSIS — I4729 Other ventricular tachycardia: Secondary | ICD-10-CM

## 2020-01-23 DIAGNOSIS — R54 Age-related physical debility: Secondary | ICD-10-CM | POA: Diagnosis present

## 2020-01-23 DIAGNOSIS — D631 Anemia in chronic kidney disease: Secondary | ICD-10-CM | POA: Diagnosis present

## 2020-01-23 DIAGNOSIS — N185 Chronic kidney disease, stage 5: Secondary | ICD-10-CM | POA: Diagnosis not present

## 2020-01-23 DIAGNOSIS — N179 Acute kidney failure, unspecified: Secondary | ICD-10-CM | POA: Diagnosis not present

## 2020-01-23 DIAGNOSIS — T8383XA Hemorrhage of genitourinary prosthetic devices, implants and grafts, initial encounter: Secondary | ICD-10-CM | POA: Diagnosis not present

## 2020-01-23 DIAGNOSIS — E1129 Type 2 diabetes mellitus with other diabetic kidney complication: Secondary | ICD-10-CM | POA: Diagnosis present

## 2020-01-23 DIAGNOSIS — Z9889 Other specified postprocedural states: Secondary | ICD-10-CM

## 2020-01-23 DIAGNOSIS — Z79899 Other long term (current) drug therapy: Secondary | ICD-10-CM

## 2020-01-23 DIAGNOSIS — Z7984 Long term (current) use of oral hypoglycemic drugs: Secondary | ICD-10-CM

## 2020-01-23 LAB — CBC WITH DIFFERENTIAL/PLATELET
Abs Immature Granulocytes: 0.03 10*3/uL (ref 0.00–0.07)
Basophils Absolute: 0 10*3/uL (ref 0.0–0.1)
Basophils Relative: 1 %
Eosinophils Absolute: 0.1 10*3/uL (ref 0.0–0.5)
Eosinophils Relative: 2 %
HCT: 30 % — ABNORMAL LOW (ref 39.0–52.0)
Hemoglobin: 9.9 g/dL — ABNORMAL LOW (ref 13.0–17.0)
Immature Granulocytes: 1 %
Lymphocytes Relative: 9 %
Lymphs Abs: 0.5 10*3/uL — ABNORMAL LOW (ref 0.7–4.0)
MCH: 28.9 pg (ref 26.0–34.0)
MCHC: 33 g/dL (ref 30.0–36.0)
MCV: 87.5 fL (ref 80.0–100.0)
Monocytes Absolute: 0.4 10*3/uL (ref 0.1–1.0)
Monocytes Relative: 8 %
Neutro Abs: 4.5 10*3/uL (ref 1.7–7.7)
Neutrophils Relative %: 79 %
Platelets: 78 10*3/uL — ABNORMAL LOW (ref 150–400)
RBC: 3.43 MIL/uL — ABNORMAL LOW (ref 4.22–5.81)
RDW: 17 % — ABNORMAL HIGH (ref 11.5–15.5)
WBC: 5.6 10*3/uL (ref 4.0–10.5)
nRBC: 0 % (ref 0.0–0.2)

## 2020-01-23 LAB — URINALYSIS, COMPLETE (UACMP) WITH MICROSCOPIC
Bacteria, UA: NONE SEEN
Bilirubin Urine: NEGATIVE
Glucose, UA: NEGATIVE mg/dL
Ketones, ur: NEGATIVE mg/dL
Nitrite: NEGATIVE
Protein, ur: 100 mg/dL — AB
RBC / HPF: >50 RBC/hpf — ABNORMAL HIGH (ref 0–5)
Specific Gravity, Urine: 1.013 (ref 1.005–1.030)
Squamous Epithelial / HPF: NONE SEEN (ref 0–5)
WBC, UA: >50 WBC/hpf — ABNORMAL HIGH (ref 0–5)
pH: 5 (ref 5.0–8.0)

## 2020-01-23 LAB — COMPREHENSIVE METABOLIC PANEL
ALT: 9 U/L (ref 0–44)
AST: 13 U/L — ABNORMAL LOW (ref 15–41)
Albumin: 3 g/dL — ABNORMAL LOW (ref 3.5–5.0)
Alkaline Phosphatase: 70 U/L (ref 38–126)
Anion gap: 20 — ABNORMAL HIGH (ref 5–15)
BUN: 106 mg/dL — ABNORMAL HIGH (ref 8–23)
CO2: 27 mmol/L (ref 22–32)
Calcium: 7.8 mg/dL — ABNORMAL LOW (ref 8.9–10.3)
Chloride: 85 mmol/L — ABNORMAL LOW (ref 98–111)
Creatinine, Ser: 7.58 mg/dL — ABNORMAL HIGH (ref 0.61–1.24)
GFR calc Af Amer: 6 mL/min — ABNORMAL LOW (ref >60)
GFR calc non Af Amer: 6 mL/min — ABNORMAL LOW (ref >60)
Glucose, Bld: 134 mg/dL — ABNORMAL HIGH (ref 70–99)
Potassium: 3.8 mmol/L (ref 3.5–5.1)
Sodium: 132 mmol/L — ABNORMAL LOW (ref 135–145)
Total Bilirubin: 0.9 mg/dL (ref 0.3–1.2)
Total Protein: 7.2 g/dL (ref 6.5–8.1)

## 2020-01-23 LAB — GLUCOSE, CAPILLARY
Glucose-Capillary: 109 mg/dL — ABNORMAL HIGH (ref 70–99)
Glucose-Capillary: 145 mg/dL — ABNORMAL HIGH (ref 70–99)

## 2020-01-23 LAB — BRAIN NATRIURETIC PEPTIDE: B Natriuretic Peptide: 2109 pg/mL — ABNORMAL HIGH (ref 0.0–100.0)

## 2020-01-23 LAB — SARS CORONAVIRUS 2 BY RT PCR (HOSPITAL ORDER, PERFORMED IN ~~LOC~~ HOSPITAL LAB): SARS Coronavirus 2: NEGATIVE

## 2020-01-23 MED ORDER — TAMSULOSIN HCL 0.4 MG PO CAPS
0.4000 mg | ORAL_CAPSULE | Freq: Every day | ORAL | Status: DC
  Administered 2020-01-23 – 2020-02-11 (×17): 0.4 mg via ORAL
  Filled 2020-01-23 (×19): qty 1

## 2020-01-23 MED ORDER — PANTOPRAZOLE SODIUM 40 MG PO TBEC
40.0000 mg | DELAYED_RELEASE_TABLET | Freq: Every day | ORAL | Status: DC
  Administered 2020-01-23 – 2020-02-11 (×16): 40 mg via ORAL
  Filled 2020-01-23 (×18): qty 1

## 2020-01-23 MED ORDER — SODIUM BICARBONATE 650 MG PO TABS
650.0000 mg | ORAL_TABLET | Freq: Two times a day (BID) | ORAL | Status: DC
  Administered 2020-01-23 – 2020-02-07 (×25): 650 mg via ORAL
  Filled 2020-01-23 (×27): qty 1

## 2020-01-23 MED ORDER — ROSUVASTATIN CALCIUM 10 MG PO TABS
5.0000 mg | ORAL_TABLET | Freq: Every day | ORAL | Status: DC
  Administered 2020-01-23 – 2020-02-10 (×18): 5 mg via ORAL
  Filled 2020-01-23 (×18): qty 1

## 2020-01-23 MED ORDER — INSULIN ASPART 100 UNIT/ML ~~LOC~~ SOLN
0.0000 [IU] | Freq: Three times a day (TID) | SUBCUTANEOUS | Status: DC
  Administered 2020-01-24 – 2020-02-01 (×3): 1 [IU] via SUBCUTANEOUS
  Administered 2020-02-02 – 2020-02-03 (×3): 2 [IU] via SUBCUTANEOUS
  Administered 2020-02-05: 1 [IU] via SUBCUTANEOUS
  Administered 2020-02-05 (×2): 2 [IU] via SUBCUTANEOUS
  Administered 2020-02-06 (×2): 1 [IU] via SUBCUTANEOUS
  Administered 2020-02-07 (×2): 2 [IU] via SUBCUTANEOUS
  Administered 2020-02-09 – 2020-02-10 (×3): 1 [IU] via SUBCUTANEOUS
  Filled 2020-01-23 (×16): qty 1

## 2020-01-23 MED ORDER — SODIUM CHLORIDE 0.9 % IV SOLN: Freq: Once | INTRAVENOUS | Status: DC

## 2020-01-23 MED ORDER — OCUVITE-LUTEIN PO CAPS
1.0000 | ORAL_CAPSULE | Freq: Two times a day (BID) | ORAL | Status: DC
  Administered 2020-01-23 – 2020-01-30 (×9): 1 via ORAL
  Filled 2020-01-23 (×15): qty 1

## 2020-01-23 MED ORDER — ONDANSETRON HCL 4 MG/2ML IJ SOLN: 4.0000 mg | Freq: Three times a day (TID) | INTRAMUSCULAR | Status: DC | PRN

## 2020-01-23 MED ORDER — ASCORBIC ACID 500 MG PO TABS
250.0000 mg | ORAL_TABLET | Freq: Every day | ORAL | Status: DC
  Administered 2020-01-24 – 2020-02-11 (×12): 250 mg via ORAL
  Filled 2020-01-23 (×14): qty 1

## 2020-01-23 MED ORDER — ALBUTEROL SULFATE (2.5 MG/3ML) 0.083% IN NEBU: 2.5000 mg | INHALATION_SOLUTION | RESPIRATORY_TRACT | Status: DC | PRN

## 2020-01-23 MED ORDER — GLUCERNA SHAKE PO LIQD
237.0000 mL | Freq: Two times a day (BID) | ORAL | Status: DC
  Administered 2020-01-24 (×2): 237 mL via ORAL

## 2020-01-23 MED ORDER — DEXTROSE 5 % IV SOLN
0.5000 g | Freq: Three times a day (TID) | INTRAVENOUS | Status: DC
  Administered 2020-01-23 – 2020-01-26 (×9): 0.5 g via INTRAVENOUS
  Filled 2020-01-23 (×12): qty 0.5

## 2020-01-23 MED ORDER — INSULIN ASPART 100 UNIT/ML ~~LOC~~ SOLN: 0.0000 [IU] | Freq: Every day | SUBCUTANEOUS | Status: DC

## 2020-01-23 MED ORDER — SODIUM CHLORIDE 0.9 % IV SOLN: INTRAVENOUS | Status: DC

## 2020-01-23 MED ORDER — FINASTERIDE 5 MG PO TABS
5.0000 mg | ORAL_TABLET | Freq: Every day | ORAL | Status: DC
  Administered 2020-01-23 – 2020-02-11 (×17): 5 mg via ORAL
  Filled 2020-01-23 (×19): qty 1

## 2020-01-23 MED ORDER — IRON-VITAMIN C 65-125 MG PO TABS: 1.0000 | ORAL_TABLET | Freq: Every day | ORAL | Status: DC

## 2020-01-23 MED ORDER — FERROUS SULFATE 325 (65 FE) MG PO TABS
325.0000 mg | ORAL_TABLET | Freq: Every day | ORAL | Status: DC
  Administered 2020-01-24 – 2020-02-11 (×12): 325 mg via ORAL
  Filled 2020-01-23 (×15): qty 1

## 2020-01-23 MED ORDER — ACETAMINOPHEN 325 MG PO TABS
650.0000 mg | ORAL_TABLET | Freq: Four times a day (QID) | ORAL | Status: DC | PRN
  Filled 2020-01-23: qty 2

## 2020-01-23 MED ORDER — METOPROLOL TARTRATE 25 MG PO TABS
37.5000 mg | ORAL_TABLET | Freq: Two times a day (BID) | ORAL | Status: DC
  Administered 2020-01-23 – 2020-01-30 (×11): 37.5 mg via ORAL
  Filled 2020-01-23 (×13): qty 2

## 2020-01-23 MED ORDER — AZELASTINE HCL 0.1 % NA SOLN
1.0000 | Freq: Two times a day (BID) | NASAL | Status: DC
  Administered 2020-01-23 – 2020-02-11 (×21): 1 via NASAL
  Filled 2020-01-23 (×2): qty 30

## 2020-01-23 MED ORDER — HEPARIN SODIUM (PORCINE) 5000 UNIT/ML IJ SOLN: 5000.0000 [IU] | Freq: Three times a day (TID) | INTRAMUSCULAR | Status: DC

## 2020-01-23 NOTE — ED Provider Notes (Signed)
ER Provider Note       Time seen: 9:31 AM   Level V caveat: History/ROS limited by altered mental status I have reviewed the vital signs and the nursing notes.  HISTORY   Chief Complaint No chief complaint on file.    HPI Blake [name] is a 86 y.o. male with a history of AAA, CHF, diabetes, GERD, hyperlipidemia, hypertension, prostate cancer, nephrostomy tubes who presents today for decreased urine output.  Patient arrives by EMS from home, EMS wanted him evaluated for low urine output.  He has a history of bladder cancer and bilateral nephrostomy tubes.  Recently had cough and URI.  Also reportedly recently finished antibiotics.  He was hypoxic on arrival to 88%.  Past Medical History:  Diagnosis Date   AAA (abdominal aortic aneurysm) (HCC)    Arrhythmia    CHF (congestive heart failure) (HCC)    Diabetes mellitus without complication (HCC)    GERD (gastroesophageal reflux disease)    GI bleed    Heart murmur    Hyperlipidemia    Hypertension    Pacemaker    Pneumonia    Prostate cancer ([place])    Sleep apnea     Past Surgical History:  Procedure Laterality Date   FLEXIBLE SIGMOIDOSCOPY N/A 10/21/2016   Procedure: FLEXIBLE SIGMOIDOSCOPY;  Surgeon: [name], MD;  Location: [facility] ENDOSCOPY;  Service: Endoscopy;  Laterality: N/A;   INSERT / REPLACE / REMOVE PACEMAKER     IR NEPHROSTOGRAM LEFT THRU EXISTING ACCESS  01/16/2019   IR NEPHROSTOMY EXCHANGE LEFT  02/06/2019   IR NEPHROSTOMY EXCHANGE LEFT  04/12/2019   IR NEPHROSTOMY EXCHANGE LEFT  06/01/2019   IR NEPHROSTOMY EXCHANGE LEFT  07/20/2019   IR NEPHROSTOMY EXCHANGE LEFT  09/19/2019   IR NEPHROSTOMY EXCHANGE LEFT  11/09/2019   IR NEPHROSTOMY EXCHANGE LEFT  12/28/2019   IR NEPHROSTOMY EXCHANGE RIGHT  02/06/2019   IR NEPHROSTOMY EXCHANGE RIGHT  04/12/2019   IR NEPHROSTOMY EXCHANGE RIGHT  06/01/2019   IR NEPHROSTOMY EXCHANGE RIGHT  07/20/2019   IR NEPHROSTOMY EXCHANGE RIGHT  09/19/2019   IR  NEPHROSTOMY EXCHANGE RIGHT  11/09/2019   IR NEPHROSTOMY EXCHANGE RIGHT  12/28/2019   IR NEPHROSTOMY PLACEMENT RIGHT  01/01/2019   JOINT REPLACEMENT     NEPHROSTOMY Bilateral    PACEMAKER INSERTION Left 04/14/2015   Procedure: INSERTION PACEMAKER;  Surgeon: Isaias Cowman, MD;  Location: [facility] ORS;  Service: Cardiovascular;  Laterality: Left;   PROSTATE SURGERY     VISCERAL ARTERY INTERVENTION N/A 10/22/2016   Procedure: Visceral Artery Intervention;  Surgeon: Algernon Huxley, MD;  Location: [place] [facility];  Service: Cardiovascular;  Laterality: N/A;    Allergies Cefepime, Diovan [valsartan], Gabapentin, Hydralazine, Lisinopril, Pregabalin, Levofloxacin, and Sulfamethoxazole-trimethoprim  Review of Systems Constitutional: Negative for fever. Cardiovascular: Negative for chest pain. Respiratory: Negative for shortness of breath. Gastrointestinal: Negative for abdominal pain, vomiting and diarrhea. Musculoskeletal: Negative for back pain. Skin: Negative for rash. Neurological: Negative for headaches, focal weakness or numbness.  All systems negative/normal/unremarkable except as stated in the HPI  ____________________________________________   PHYSICAL EXAM:  VITAL SIGNS: Vitals:   01/23/20 0926  BP: (!) 141/84  Pulse: 90  Resp: 16  SpO2: 95%    Constitutional: Chronically ill-appearing, no distress ENT      Head: Normocephalic and atraumatic.      Nose: No congestion/rhinnorhea.      Mouth/Throat: Mucous membranes are moist.      Neck: No stridor. Cardiovascular: Normal rate, regular rhythm. No  murmurs, rubs, or gallops. Respiratory: Normal respiratory effort without tachypnea nor retractions. Breath sounds are clear and equal bilaterally. No wheezes/rales/rhonchi. Gastrointestinal: Soft and nontender. Normal bowel sounds Genitourinary: Bilateral nephrostomy tube output, left is red-tinged compared to right Musculoskeletal: Nontender with normal range of motion  in extremities. No lower extremity tenderness nor edema. Neurologic:  Normal speech and language. No gross focal neurologic deficits are appreciated.  Skin:  Skin is warm, dry and intact. No rash noted. Psychiatric: Speech and behavior are normal.  ____________________________________________   LABS (pertinent positives/negatives)  Labs Reviewed  COMPREHENSIVE METABOLIC PANEL - Abnormal; Notable for the following components:      Result Value   Sodium 132 (*)    Chloride 85 (*)    Glucose, Bld 134 (*)    BUN 106 (*)    Creatinine, Ser 7.58 (*)    Calcium 7.8 (*)    Albumin 3.0 (*)    AST 13 (*)    GFR calc non Af Amer 6 (*)    GFR calc Af Amer 6 (*)    Anion gap 20 (*)    All other components within normal limits  URINALYSIS, COMPLETE (UACMP) WITH MICROSCOPIC - Abnormal; Notable for the following components:   Color, Urine AMBER (*)    APPearance CLOUDY (*)    Hgb urine dipstick LARGE (*)    Protein, ur 100 (*)    Leukocytes,Ua LARGE (*)    RBC / HPF >50 (*)    WBC, UA >50 (*)    All other components within normal limits  URINE CULTURE  CBC WITH DIFFERENTIAL/PLATELET    RADIOLOGY  Renal ultrasound Is still pending  DIFFERENTIAL DIAGNOSIS  Dehydration, electrolyte abnormality, UTI, sepsis, pneumonia  ASSESSMENT AND PLAN  Weakness, acute on chronic renal failure   Plan: The patient had presented for decreased urine output. Patient's labs did reveal significant worsening in his renal function with acute on chronic renal failure.  Family is interested in pursuing hospital admission and possibly dialysis should he be a candidate.  I discussed with his primary care doctor.  I will discuss with the hospitalist for admission.  Lenise Arena MD    Note: This note was generated in part or whole with voice recognition software. Voice recognition is usually quite accurate but there are transcription errors that can and very often do occur. I apologize for any  typographical errors that were not detected and corrected.     Earleen Newport, MD 01/23/20 1220

## 2020-01-23 NOTE — ED Notes (Signed)
Pt son at bedside.

## 2020-01-23 NOTE — Progress Notes (Signed)
Pharmacy Antibiotic Note  HARBOR VANOVER is a 86 y.o. male admitted on 01/23/2020. Pharmacy has been consulted for aztreonam dosing.  Plan: Aztreonam 500 mg IV q8h  Height: 6' (182.9 cm) Weight: 81.6 kg (180 lb) IBW/kg (Calculated) : 77.6  Temp (24hrs), Avg:97.7 F (36.5 C), Min:97.7 F (36.5 C), Max:97.7 F (36.5 C)  Recent Labs  Lab 01/23/20 0949  WBC 5.6  CREATININE 7.58*    Estimated Creatinine Clearance: 6.4 mL/min (A) (by C-G formula based on SCr of 7.58 mg/dL (H)).    Allergies  Allergen Reactions  . Cefepime     seizure  . Diovan [Valsartan] Cough  . Gabapentin Other (See Comments)    Sedation at all doses  . Hydralazine Itching  . Lisinopril Cough  . Pregabalin Other (See Comments)    Sedation at all doses  . Levofloxacin Other (See Comments)    Too many side effects. Nausea, vomiting, upset stomach, increased confusion, etc Other reaction(s): Confusion Nausea, chills Nausea, chills   . Sulfamethoxazole-Trimethoprim Other (See Comments)    Too many side effects. Nausea, vomiting, upset stomach, increased confusion, etc    Antimicrobials this admission: Aztreonam 7/14 >>    Microbiology results: 7/14 BCx: pending 7/14 UCx: pending    Thank you for allowing pharmacy to be a part of this patient's care.  Tawnya Crook, PharmD 01/23/2020 3:43 PM

## 2020-01-23 NOTE — ED Notes (Signed)
Pt assisted to bed pan. Pt with small bowel movement. Pt cleaned and clean brief placed on pt.

## 2020-01-23 NOTE — ED Triage Notes (Signed)
Pt to ED via [facility] from home. Per EMS pt family wanted him to be sent for evaluation of decreased UO. Pt with hx bladder cancer and has bilateral nephrostomy tubes. Pt also recently evaluated for cough/URI.  Upon arrival pt in NAD. Pt A&Ox2. Pt denies pain. EMS had pt on 4L [place]. Unsure if pt uses oxygen at home. Pt RA sats 88%.

## 2020-01-23 NOTE — H&P (Signed)
History and Physical    Blake [name] FYB:[id number] DOB: [birth year] DOA: 01/23/2020  Referring MD/NP/PA:   PCP: Adin Hector, MD   Patient coming from:  The patient is coming from home.  At baseline, pt is partially dependent for most of ADL.        Chief Complaint: Decreased urine output  HPI: Blake [name] is a 86 y.o. male with medical history significant of hypertension, hyperlipidemia, diabetes mellitus, GERD, OSA, prostate cancer, urothelial carcinoma of the bladder, s/p of bilateral nephrostomy, pacemaker placement due to SSS, GI bleeding, dCHF, SVT, CKD-IV, thrombocytopenia, chronic respiratory failure on 2 L nasal cannula oxygen at night at home, who presents with decreased urine output.  Per his son and daughter, pt has hx of urothelial carcinoma of the bladder and is s/p of bilateral nephrostomy. Pt was noted to have decreased urine output at home. Not sure if pt has symptoms of UTI.  Does not seem to have hematuria. Pt is using 2L oxygen at night at home, but not in the daytime.  Patient was found to have oxygen desaturated to 88% on room air, which improved to 95 to 96% on 2 L nasal cannula oxygen in ED. patient seems to have mild shortness breath, but no cough, chest pain.  He has left lower leg edema. No fever or chills.  Patient has little loose stool bowel movement recently, but no active diarrhea, nausea, vomiting, abdominal pain per his son. Per EDP, pt recently had cough and URI.  Also reportedly recently finished antibiotics. Pt knows his own name, but not oriented to place and time.   ED Course: pt was found to have WBC 5.6, platelets 78, BNP 2109, pending COVID-19 PCR, urinalysis (cloudy appearance, large amount of leukocyte, no bacteria, WBC >50), worsening renal function (creatinine 3.69 on 11/04/2019 --> 7.58, BUN 106), temperature normal, blood pressure 123/81, heart rate is 38, 90, RR 35, pending chest x-ray.  Renal ultrasound showed no hydronephrosis.  Patient is  admitted to [place] bed as inpatient.  Review of Systems:   General: no fevers, chills, no body weight gain, has fatigue HEENT: no blurry vision, hearing changes or sore throat Respiratory: has dyspnea, no coughing, wheezing CV: no chest pain, no palpitations GI: no nausea, vomiting, abdominal pain, diarrhea, constipation GU: no dysuria, burning on urination, increased urinary frequency, hematuria  Ext: has leg edema Neuro: no unilateral weakness, numbness, or tingling, no vision change or hearing loss. Has confusion. Skin: no rash, no skin tear. MSK: No muscle spasm, no deformity, no limitation of range of movement in spin Heme: No easy bruising.  Travel history: No recent long distant travel.  Allergy:  Allergies  Allergen Reactions  . Cefepime     seizure  . Diovan [Valsartan] Cough  . Gabapentin Other (See Comments)    Sedation at all doses  . Hydralazine Itching  . Lisinopril Cough  . Pregabalin Other (See Comments)    Sedation at all doses  . Levofloxacin Other (See Comments)    Too many side effects. Nausea, vomiting, upset stomach, increased confusion, etc Other reaction(s): Confusion Nausea, chills Nausea, chills   . Sulfamethoxazole-Trimethoprim Other (See Comments)    Too many side effects. Nausea, vomiting, upset stomach, increased confusion, etc    Past Medical History:  Diagnosis Date  . AAA (abdominal aortic aneurysm) ([place])   . Arrhythmia   . CHF (congestive heart failure) ([place])   . Diabetes mellitus without complication ([place])   . GERD (gastroesophageal reflux  disease)   . GI bleed   . Heart murmur   . Hyperlipidemia   . Hypertension   . Pacemaker   . Pneumonia   . Prostate cancer ([place])   . Sleep apnea     Past Surgical History:  Procedure Laterality Date  . FLEXIBLE SIGMOIDOSCOPY N/A 10/21/2016   Procedure: FLEXIBLE SIGMOIDOSCOPY;  Surgeon: [name], MD;  Location: [facility] END[facility];  Service: Endoscopy;  Laterality: N/A;  . INSERT / REPLACE  / REMOVE PACEMAKER    . IR NEPHROSTOGRAM LEFT THRU EXISTING ACCESS  01/16/2019  . IR NEPHROSTOMY EXCHANGE LEFT  02/06/2019  . IR NEPHROSTOMY EXCHANGE LEFT  04/12/2019  . IR NEPHROSTOMY EXCHANGE LEFT  06/01/2019  . IR NEPHROSTOMY EXCHANGE LEFT  07/20/2019  . IR NEPHROSTOMY EXCHANGE LEFT  09/19/2019  . IR NEPHROSTOMY EXCHANGE LEFT  11/09/2019  . IR NEPHROSTOMY EXCHANGE LEFT  12/28/2019  . IR NEPHROSTOMY EXCHANGE RIGHT  02/06/2019  . IR NEPHROSTOMY EXCHANGE RIGHT  04/12/2019  . IR NEPHROSTOMY EXCHANGE RIGHT  06/01/2019  . IR NEPHROSTOMY EXCHANGE RIGHT  07/20/2019  . IR NEPHROSTOMY EXCHANGE RIGHT  09/19/2019  . IR NEPHROSTOMY EXCHANGE RIGHT  11/09/2019  . IR NEPHROSTOMY EXCHANGE RIGHT  12/28/2019  . IR NEPHROSTOMY PLACEMENT RIGHT  01/01/2019  . JOINT REPLACEMENT    . NEPHROSTOMY Bilateral   . PACEMAKER INSERTION Left 04/14/2015   Procedure: INSERTION PACEMAKER;  Surgeon: Isaias Cowman, MD;  Location: [facility];  Service: Cardiovascular;  Laterality: Left;  . PROSTATE SURGERY    . VISCERAL ARTERY INTERVENTION N/A 10/22/2016   Procedure: Visceral Artery Intervention;  Surgeon: Algernon Huxley, MD;  Location: [place] [facility];  Service: Cardiovascular;  Laterality: N/A;    Social History:  reports that he has quit smoking. He has never used smokeless tobacco. He reports that he does not drink alcohol and does not use drugs.  Family History:  Family History  Problem Relation Age of Onset  . Hypertension Other   . Stroke Other   . Heart attack Other   . Stroke Sister   . Heart attack Brother      Prior to Admission medications   Medication Sig Start Date End Date Taking? Authorizing Provider  feeding supplement, GLUCERNA SHAKE, (GLUCERNA SHAKE) LIQD Take 237 mLs by mouth 2 (two) times daily between meals. 01/01/19   Fritzi Mandes, MD  finasteride (PROSCAR) 5 MG tablet Take 5 mg by mouth daily.    [provider]  Iron-Vitamin C (VITRON-C PO) Take 1 tablet by mouth daily.    [provider]  metoprolol tartrate 37.5 MG TABS Take 37.5 mg by mouth 2 (two) times daily. 09/13/17   Gladstone Lighter, MD  Multiple Vitamins-Minerals (PRESERVISION AREDS) CAPS Take 1 capsule by mouth 2 (two) times daily.    [provider]  multivitamin-iron-minerals-folic acid (CENTRUM) chewable tablet Chew 2 tablets by mouth daily.    [provider]  pantoprazole (PROTONIX) 40 MG tablet Take 40 mg by mouth daily.    [provider]  rosuvastatin (CRESTOR) 5 MG tablet Take 5 mg by mouth at bedtime.    [provider]  sitaGLIPtin (JANUVIA) 50 MG tablet Take 0.5 tablets (25 mg total) by mouth daily. 01/01/19   Fritzi Mandes, MD  sodium bicarbonate 650 MG tablet Take 1 tablet (650 mg total) by mouth 2 (two) times daily. 09/13/17   Gladstone Lighter, MD  tamsulosin (FLOMAX) 0.4 MG CAPS capsule Take 0.4 mg by mouth daily.     [provider]  torsemide (DEMADEX) 20 MG tablet Take 40 mg by mouth 2 (two) times daily.  03/29/19 03/28/20  [provider]    Physical Exam: Vitals:   01/23/20 1149 01/23/20 1330 01/23/20 1535 01/23/20 1537  BP: 123/81 (!) 131/91 126/79 113/80  Pulse: (!) 38 88  61  Resp: (!) 35 16 19   Temp:      TempSrc:      SpO2: 96% 90% 98% 96%  Weight:      Height:       General: Not in acute distress HEENT:       Eyes: PERRL, EOMI, no scleral icterus.       ENT: No discharge from the ears and nose       Neck: No JVD, no bruit, no mass felt. Heme: No neck lymph node enlargement. Cardiac: Z0/C5, RRR, 3/6 systolic murmurs, No gallops or rubs. Respiratory:  Has rales and rhonchi bilaterally GI: Soft, nondistended, nontender, no organomegaly, BS present. GU: No hematuria Ext: 2+ pitting leg edema bilaterally. 2+DP/PT pulse bilaterally. Musculoskeletal: No joint deformities, No joint redness or warmth, no limitation of ROM in spin. Skin: No rashes.  Neuro: confused, knows his own name, not oriented to place and time.  cranial nerves II-XII grossly intact, moves all extremities  Psych: Patient is not psychotic, no suicidal or hemocidal ideation.  Labs on Admission: I have personally reviewed following labs and imaging studies  CBC: Recent Labs  Lab 01/23/20 0949  WBC 5.6  NEUTROABS 4.5  HGB 9.9*  HCT 30.0*  MCV 87.5  PLT 78*   Basic Metabolic Panel: Recent Labs  Lab 01/23/20 0949  NA 132*  K 3.8  CL 85*  CO2 27  GLUCOSE 134*  BUN 106*  CREATININE 7.58*  CALCIUM 7.8*   GFR: Estimated Creatinine Clearance: 6.4 mL/min (A) (by C-G formula based on SCr of 7.58 mg/dL (H)). Liver Function Tests: Recent Labs  Lab 01/23/20 0949  AST 13*  ALT 9  ALKPHOS 70  BILITOT 0.9  PROT 7.2  ALBUMIN 3.0*   No results for input(s): LIPASE, AMYLASE in the last 168 hours. No results for input(s): AMMONIA in the last 168 hours. Coagulation Profile: No results for input(s): INR, PROTIME in the last 168 hours. Cardiac Enzymes: No results for input(s): CKTOTAL, CKMB, CKMBINDEX, TROPONINI in the last 168 hours. BNP (last 3 results) No results for input(s): PROBNP in the last 8760 hours. HbA1C: No results for input(s): HGBA1C in the last 72 hours. CBG: No results for input(s): GLUCAP in the last 168 hours. Lipid Profile: No results for input(s): CHOL, HDL, LDLCALC, TRIG, CHOLHDL, LDLDIRECT in the last 72 hours. Thyroid Function Tests: No results for input(s): TSH, T4TOTAL, FREET4, T3FREE, THYROIDAB in the last 72 hours. Anemia Panel: No results for input(s): VITAMINB12, FOLATE, FERRITIN, TIBC, IRON, RETICCTPCT in the last 72 hours. Urine analysis:    Component Value Date/Time   COLORURINE AMBER (A) 01/23/2020 0940   APPEARANCEUR CLOUDY (A) 01/23/2020 0940   APPEARANCEUR Cloudy (A) 03/05/2019 1629   LABSPEC 1.013 01/23/2020 0940   LABSPEC 1.029 12/29/2012 1554   PHURINE 5.0 01/23/2020 0940   GLUCOSEU NEGATIVE 01/23/2020 0940   GLUCOSEU see comment 12/29/2012 1554   HGBUR LARGE (A) 01/23/2020  0940   BILIRUBINUR NEGATIVE 01/23/2020 0940   BILIRUBINUR Negative 03/05/2019 1629   BILIRUBINUR see comment 12/29/2012 [place] 01/23/2020 0940   PROTEINUR 100 (A) 01/23/2020 0940   NITRITE NEGATIVE 01/23/2020 0940   LEUKOCYTESUR  LARGE (A) 01/23/2020 0940   LEUKOCYTESUR see comment 12/29/2012 1554   Sepsis Labs: @LABRCNTIP (procalcitonin:4,lacticidven:4) )No results found for this or any previous visit (from the past 240 hour(s)).   Radiological Exams on Admission: US Renal  Result Date: 01/23/2020 CLINICAL DATA:  Renal failure. History of bladder cancer. History of prostate cancer. EXAM: RENAL / URINARY TRACT ULTRASOUND COMPLETE COMPARISON:  CT scan 11/03/2019 FINDINGS: Right Kidney: Renal measurements: 8.3 x 4.0 x 4.0 cm = volume: 69.53 mL. Increased echogenicity and renal cortical scarring type changes. A right nephrostomy tube is noted. No hydronephrosis. No worrisome renal lesions are identified. There is a small cyst noted in the midpole region. Left Kidney: Renal measurements: 11.0 x 5.8 x 5.6 cm = volume: 185.88 mL. Slight increased echogenicity but normal renal cortical thickness. A nephrostomy tube is noted. No hydronephrosis. No worrisome renal lesions. Bladder: Slightly thick-walled bladder with echogenic material which could represent hematoma or mass. Other: Small bilateral pleural effusions are noted along with trace ascites. IMPRESSION: 1. Small right kidney and normal size left kidney. 2. Bilateral nephrostomy tubes.  No hydronephrosis. 3. Indeterminate echogenic material in the bladder could be hematoma or mass. 4. Small bilateral pleural effusions. Electronically Signed   By: Marijo Sanes M.D.   On: 01/23/2020 12:49     EKG:  Not done in ED, will get one.   Assessment/Plan Principal Problem:   Acute renal failure superimposed on stage 4 chronic kidney disease (HCC) Active Problems:   SVT (supraventricular tachycardia) (HCC)   HLD (hyperlipidemia)    Recurrent UTI   Urothelial carcinoma of bladder (HCC)   Chronic diastolic CHF (congestive heart failure) (HCC)   HTN (hypertension)   Type II diabetes mellitus with renal manifestations (HCC)   Acute respiratory failure with hypoxia (HCC)   Normocytic anemia   Acute renal failure superimposed on stage 4 chronic kidney disease ([place]): Cre is up from 3.69 on 11/04/19 -->7.58, BUN 106.  Renal ultrasound showed no hydronephrosis.  May be due to UTI. Dr. Holley Raring of renal is consulted.    -Admitted to MedSurg bed as inpatient -hold torsemide -treat UTI as blow -gentle IVF: 50 cc/h of NS (patient has already had fluid overload, cannot give aggressive IV fluid treatment) -Avoid using renal toxic medications -f/u Dr. Holley Raring recommendations  Possible recurrent UTI -aztreonam -f/u blood and urine culture  SVT (supraventricular tachycardia) (HCC) -Metoprolol  HLD (hyperlipidemia) -Crestor  Urothelial carcinoma of bladder and hx of prostate cancer -s/p bilateral nephrostomy -Proscar and Flomax  Acute respiratory failure with hypoxia: Likely due to fluid overload.  Patient has 2+ leg edema -As needed albuterol -Nasal cannula oxygen to maintain oxygen saturation above 93% -Follow-up chest x-ray  Chronic diastolic CHF (congestive heart failure) ([place]): 2D echo on 11/22/2018 showed EF 55-60%.  Patient has elevated BNP 2109, 2+ leg edema, indicating fluid overload.  Since patient has worsening renal function, will hold diuretics -Hold torsemide -Nasal cannula oxygen to maintain oxygen saturation above 93%  HTN (hypertension) -As needed hydralazine -Metoprolol  Type II diabetes mellitus with renal manifestations [facility]): Recent A1c 7.5, patient is taking Januvia -SSI  Normocytic anemia: Hemoglobin 9.9 which was a 10.9 on 4/20/4/21.  No active bleeding tendency. -Follow-up with CBC      DVT ppx: SCD Code Status: Full code per his son and daughter Family Communication:  Yes, patient's  son at bed side. I also called Blake [name] daughter Disposition Plan:  Anticipate discharge back to previous environment Consults called:  Dr. Holley Raring of renal Admission  status: Med-surg bed as inpt     Status is: Inpatient  Remains inpatient appropriate because:Inpatient level of care appropriate due to severity of illness.  Patient has multiple comorbidities, now presents with decreased urine output, worsening renal function, possible UTI, acute on chronic respiratory failure with hypoxia.  Blake [name] presentation is highly complicated.  Patient is at high risk for deteriorating given his old age.  Patient will need to be treated in hospital for at least 2 days.   Dispo: The patient is from: Home              Anticipated d/c is to: Home              Anticipated d/c date is: 2 days              Patient currently is not medically stable to d/c.           Date of Service 01/23/2020    Blake [name] [facility]   If 7PM-7AM, please contact night-coverage [email or web address] 01/23/2020, 3:47 PM

## 2020-01-24 DIAGNOSIS — N179 Acute kidney failure, unspecified: Secondary | ICD-10-CM | POA: Diagnosis not present

## 2020-01-24 DIAGNOSIS — I159 Secondary hypertension, unspecified: Secondary | ICD-10-CM

## 2020-01-24 DIAGNOSIS — E785 Hyperlipidemia, unspecified: Secondary | ICD-10-CM | POA: Diagnosis not present

## 2020-01-24 DIAGNOSIS — I5032 Chronic diastolic (congestive) heart failure: Secondary | ICD-10-CM | POA: Diagnosis not present

## 2020-01-24 LAB — CBC
HCT: 29.8 % — ABNORMAL LOW (ref 39.0–52.0)
Hemoglobin: 9.9 g/dL — ABNORMAL LOW (ref 13.0–17.0)
MCH: 28.6 pg (ref 26.0–34.0)
MCHC: 33.2 g/dL (ref 30.0–36.0)
MCV: 86.1 fL (ref 80.0–100.0)
Platelets: 70 10*3/uL — ABNORMAL LOW (ref 150–400)
RBC: 3.46 MIL/uL — ABNORMAL LOW (ref 4.22–5.81)
RDW: 17.1 % — ABNORMAL HIGH (ref 11.5–15.5)
WBC: 5.4 10*3/uL (ref 4.0–10.5)
nRBC: 0 % (ref 0.0–0.2)

## 2020-01-24 LAB — BASIC METABOLIC PANEL
Anion gap: 16 — ABNORMAL HIGH (ref 5–15)
BUN: 102 mg/dL — ABNORMAL HIGH (ref 8–23)
CO2: 27 mmol/L (ref 22–32)
Calcium: 7.7 mg/dL — ABNORMAL LOW (ref 8.9–10.3)
Chloride: 89 mmol/L — ABNORMAL LOW (ref 98–111)
Creatinine, Ser: 7.42 mg/dL — ABNORMAL HIGH (ref 0.61–1.24)
GFR calc Af Amer: 7 mL/min — ABNORMAL LOW (ref >60)
GFR calc non Af Amer: 6 mL/min — ABNORMAL LOW (ref >60)
Glucose, Bld: 117 mg/dL — ABNORMAL HIGH (ref 70–99)
Potassium: 3.8 mmol/L (ref 3.5–5.1)
Sodium: 132 mmol/L — ABNORMAL LOW (ref 135–145)

## 2020-01-24 LAB — GLUCOSE, CAPILLARY
Glucose-Capillary: 114 mg/dL — ABNORMAL HIGH (ref 70–99)
Glucose-Capillary: 119 mg/dL — ABNORMAL HIGH (ref 70–99)
Glucose-Capillary: 150 mg/dL — ABNORMAL HIGH (ref 70–99)
Glucose-Capillary: 104 mg/dL — ABNORMAL HIGH (ref 70–99)

## 2020-01-24 MED ORDER — NEPRO/CARBSTEADY PO LIQD
237.0000 mL | Freq: Two times a day (BID) | ORAL | Status: DC
  Administered 2020-01-24 – 2020-01-30 (×6): 237 mL via ORAL

## 2020-01-24 NOTE — Care Management Important Message (Signed)
Important Message  Patient Details  Name: Blake [name] MRN: [id number] Date of Birth: 1923-06-28   Medicare Important Message Given:  Yes  Initial Medicare IM given by Patient Access Associate on 01/24/2020 at 11:05am.    [name] 01/24/2020, 7:49 PM

## 2020-01-24 NOTE — Progress Notes (Signed)
[facility] NAME: Blake [name]    MR#:  [id number]  DATE OF BIRTH:  1922/11/28  SUBJECTIVE:  appears very lethargic. Per RN poor PO intake. Had difficulty taking his meds. Not much interacting this morning.  REVIEW OF SYSTEMS:   Review of Systems  Unable to perform ROS: Mental status change   Tolerating Diet: Tolerating PT:   DRUG ALLERGIES:   Allergies  Allergen Reactions  . Cefepime     seizure  . Diovan [Valsartan] Cough  . Gabapentin Other (See Comments)    Sedation at all doses  . Hydralazine Itching  . Lisinopril Cough  . Pregabalin Other (See Comments)    Sedation at all doses  . Levofloxacin Other (See Comments)    Too many side effects. Nausea, vomiting, upset stomach, increased confusion, etc Other reaction(s): Confusion Nausea, chills Nausea, chills   . Sulfamethoxazole-Trimethoprim Other (See Comments)    Too many side effects. Nausea, vomiting, upset stomach, increased confusion, etc    VITALS:  Blood pressure 121/77, pulse 81, temperature 97.8 F (36.6 C), temperature source Oral, resp. rate 20, height 6' (1.829 m), weight 81.6 kg, SpO2 100 %.  PHYSICAL EXAMINATION:   Physical Exam limited exam due to patient participation and decline in condition GENERAL:  86 y.o.-year-old patient lying in the bed with no acute distress. Chronically ill and debilitated LUNGS: coarse breath sounds bilaterally, no wheezing, rales, rhonchi. No use of accessory muscles of respiration.  CARDIOVASCULAR: S1, S2 normal. No murmurs, rubs, or gallops.  ABDOMEN: Soft, nontender, nondistended. Bowel sounds present. No organomegaly or mass. Chronic Bilateral nephrostomy tubes+ EXTREMITIES: No cyanosis, clubbing or edema b/l.    NEUROLOGIC: unable to assess. Patient quite weak and lethargic  PSYCHIATRIC:  patient is lethargic SKIN: No obvious rash, lesion, or ulcer.  Pressure Injury 11/21/18 Buttocks Bilateral;Left;Right Stage  II -  Partial thickness loss of dermis presenting as a shallow open ulcer with a red, pink wound bed without slough. pink (Active)  11/21/18 2350  Location: Buttocks  Location Orientation: Bilateral;Left;Right  Staging: Stage II -  Partial thickness loss of dermis presenting as a shallow open ulcer with a red, pink wound bed without slough.  Wound Description (Comments): pink  Present on Admission: Yes     Pressure Injury 01/01/19 Buttocks Left Stage I -  Intact skin with non-blanchable redness of a localized area usually over a bony prominence. (Active)  01/01/19 0805  Location: Buttocks  Location Orientation: Left  Staging: Stage I -  Intact skin with non-blanchable redness of a localized area usually over a bony prominence.  Wound Description (Comments):   Present on Admission: Yes     Pressure Injury 09/25/19 Sacrum Stage 3 -  Full thickness tissue loss. Subcutaneous fat may be visible but bone, tendon or muscle are NOT exposed. 1cm x 1cm . Cleansed and Foam applied (Active)  09/25/19 0200  Location: Sacrum  Location Orientation:   Staging: Stage 3 -  Full thickness tissue loss. Subcutaneous fat may be visible but bone, tendon or muscle are NOT exposed.  Wound Description (Comments): 1cm x 1cm . Cleansed and Foam applied  Present on Admission: Yes     Pressure Injury 09/25/19 Heel Right Deep Tissue Pressure Injury - Purple or maroon localized area of discolored intact skin or blood-filled blister due to damage of underlying soft tissue from pressure and/or shear. Right heel is purple. Skin intact. Ele (Active)  09/25/19 0200  Location: Heel  Location Orientation: Right  Staging: Deep Tissue Pressure Injury - Purple or maroon localized area of discolored intact skin or blood-filled blister due to damage of underlying soft tissue from pressure and/or shear.  Wound Description (Comments): Right heel is purple. Skin intact. Elevated on 2 pillows.  Present on Admission: Yes      Pressure Injury 01/23/20 Sacrum Medial Stage 2 -  Partial thickness loss of dermis presenting as a shallow open injury with a red, pink wound bed without slough. st 2 to right cheek (Active)  01/23/20 1647  Location: Sacrum  Location Orientation: Medial  Staging: Stage 2 -  Partial thickness loss of dermis presenting as a shallow open injury with a red, pink wound bed without slough.  Wound Description (Comments): st 2 to right cheek  Present on Admission: Yes      LABORATORY PANEL:  CBC Recent Labs  Lab 01/24/20 0438  WBC 5.4  HGB 9.9*  HCT 29.8*  PLT 70*    Chemistries  Recent Labs  Lab 01/23/20 0949 01/23/20 0949 01/24/20 0438  NA 132*   < > 132*  K 3.8   < > 3.8  CL 85*   < > 89*  CO2 27   < > 27  GLUCOSE 134*   < > 117*  BUN 106*   < > 102*  CREATININE 7.58*   < > 7.42*  CALCIUM 7.8*   < > 7.7*  AST 13*  --   --   ALT 9  --   --   ALKPHOS 70  --   --   BILITOT 0.9  --   --    < > = values in this interval not displayed.   Cardiac Enzymes No results for input(s): TROPONINI in the last 168 hours. RADIOLOGY:  US Renal  Result Date: 01/23/2020 CLINICAL DATA:  Renal failure. History of bladder cancer. History of prostate cancer. EXAM: RENAL / URINARY TRACT ULTRASOUND COMPLETE COMPARISON:  CT scan 11/03/2019 FINDINGS: Right Kidney: Renal measurements: 8.3 x 4.0 x 4.0 cm = volume: 69.53 mL. Increased echogenicity and renal cortical scarring type changes. A right nephrostomy tube is noted. No hydronephrosis. No worrisome renal lesions are identified. There is a small cyst noted in the midpole region. Left Kidney: Renal measurements: 11.0 x 5.8 x 5.6 cm = volume: 185.88 mL. Slight increased echogenicity but normal renal cortical thickness. A nephrostomy tube is noted. No hydronephrosis. No worrisome renal lesions. Bladder: Slightly thick-walled bladder with echogenic material which could represent hematoma or mass. Other: Small bilateral pleural effusions are noted along  with trace ascites. IMPRESSION: 1. Small right kidney and normal size left kidney. 2. Bilateral nephrostomy tubes.  No hydronephrosis. 3. Indeterminate echogenic material in the bladder could be hematoma or mass. 4. Small bilateral pleural effusions. Electronically Signed   By: Marijo Sanes M.D.   On: 01/23/2020 12:49   DG Chest Port 1 View  Result Date: 01/23/2020 CLINICAL DATA:  Shortness of breath EXAM: PORTABLE CHEST 1 VIEW COMPARISON:  September 24, 2019 FINDINGS: There is stable cardiomegaly. Aortic knob calcifications. Hazy interstitial opacities are seen throughout both lungs right greater than left. There is a moderate right effusion present. Hazy airspace opacity at the right lung base, likely layering effusions/atelectasis. A left-sided pacemaker again noted. No acute osseous abnormality. IMPRESSION: Findings suggestive of interstitial edema. Moderate right pleural effusion with adjacent hazy airspace opacity, likely layering effusions/atelectasis. Electronically Signed   By: Prudencio Pair M.D.   On: 01/23/2020 15:45  ASSESSMENT AND PLAN:   Blake [name] is a 86 y.o. male with medical history significant of hypertension, hyperlipidemia, diabetes mellitus, GERD, OSA, prostate cancer, urothelial carcinoma of the bladder, s/p of bilateral nephrostomy, pacemaker placement due to SSS, GI bleeding, dCHF, SVT, CKD-IV, thrombocytopenia, chronic respiratory failure on 2 L nasal cannula oxygen at night at home, who presents with decreased urine output.  Acute renal failure superimposed on stage 4 chronic kidney disease ([place]): Cre is up from 3.69 on 11/04/19 -->7.58, BUN 106.  - Renal ultrasound showed no hydronephrosis. -Nephrology consultation with Dr. Holley Raring-- appears renal function has deteriorated causing uremia/encephalopathy/poor urine output/overall decline -IV fluids-- cautiously watch for respiratory decompensation given history of CHF -hold torsemide -gentle IVF: 50 cc/h of NS (patient has  already had fluid overload, cannot give aggressive IV fluid treatment) -Avoid using renal toxic medications -f/u Dr. Holley Raring recommendations-- who discussed at length with Dr. crisp daughter and son along with me on speakerphone. Option for trial of dialysis was presented versus considering palliative care and hospice. Family will think about it and get back in touch with [place]. -- PCP Dr. Caryl Comes has set up patient family to meet with palliative care on July 30. -- Overall poor prognosis  Possible recurrent UTI -aztreonam -f/u blood and urine culture  SVT (supraventricular tachycardia) (HCC) -Metoprolol  HLD (hyperlipidemia) -Crestor  Urothelial carcinoma of bladder and hx of prostate cancer -s/p bilateral nephrostomy -Proscar and Flomax  Acute respiratory failure with hypoxia: Likely due to fluid overload.  Patient has 2+ leg edema -As needed albuterol -Nasal cannula oxygen to maintain oxygen saturation above 47%  Chronic diastolic CHF (congestive heart failure) ([place]): 2D echo on 11/22/2018 showed EF 55-60%.  Patient has elevated BNP 2109, 2+ leg edema, indicating fluid overload.  Since patient has worsening renal function, will hold diuretics -Hold torsemide -Nasal cannula oxygen to maintain oxygen saturation above 93%  HTN (hypertension) -As needed hydralazine -Metoprolol  Type II diabetes mellitus with renal manifestations [facility]): Recent A1c 7.5, patient is taking Januvia -SSI  Normocytic anemia: Hemoglobin 9.9 which was a 10.9 on 4/20/4/21.  No active bleeding tendency. -Follow-up with CBC  Chronic decubitus pressure ulcers present on admission -follow-up wound care   DVT ppx: SCD Code Status: Full code per his son and daughter Family Communication:  Yes, patient's son at bed side. I also called her daughter Disposition Plan:  Anticipate discharge back to previous environment Consults called:  Dr. Holley Raring of renal Admission status: Med-surg bed as inpt      Status is: Inpatient  Remains inpatient appropriate because:Inpatient level of care appropriate due to severity of illness.  Patient has multiple comorbidities, now presents with decreased urine output, worsening renal function, possible UTI, acute on chronic respiratory failure with hypoxia.  Her presentation is highly complicated.  Patient is at high risk for deteriorating given his old age.  Patient will need to be treated in hospital for at least 2 days.   Dispo: The patient is from: Home  Anticipated d/c is to: Home  Anticipated d/c date is: 2 days  Patient currently is not medically stable to d/c.        TOTAL TIME TAKING CARE OF THIS PATIENT: **30* minutes.  >50% time spent on counselling and coordination of care  Note: This dictation was prepared with Dragon dictation along with smaller phrase technology. Any transcriptional errors that result from this process are unintentional.  Fritzi Mandes M.D    [facility]   CC: Primary care physician;  Adin Hector, MDPatient ID: Blake [name], male   DOB: 10-28-1922, 86 y.o.   MRN: [id number]

## 2020-01-24 NOTE — Progress Notes (Addendum)
Initial Nutrition Assessment  DOCUMENTATION CODES:   Not applicable  INTERVENTION:   Nepro Shake po BID, each supplement provides 425 kcal and 19 grams protein  Vitamin C 250mg  po daily   Dysphagia 3 diet    NUTRITION DIAGNOSIS:   Inadequate oral intake related to acute illness as evidenced by meal completion < 50%.  GOAL:   Patient will meet greater than or equal to 90% of their needs  MONITOR:   PO intake, Supplement acceptance, Labs, Weight trends, Skin, I & O's  REASON FOR ASSESSMENT:   Malnutrition Screening Tool    ASSESSMENT:   86 y.o. male with medical history significant of hypertension, hyperlipidemia, diabetes mellitus, GERD, OSA, prostate cancer, urothelial carcinoma of the bladder, s/p of bilateral nephrostomy, pacemaker placement due to SSS, GI bleeding, dCHF, SVT, CKD-IV, thrombocytopenia, chronic respiratory failure on 2 L nasal cannula oxygen at night at home, who presents with decreased urine output.   RD working remotely.  Pt with poor appetite and oral intake in hospital; pt eating 10-30% of meals but is drinking some Glucerna. Per chart review, pt enjoys chocolate Glucerna at home. RD will liberalize pt's diet to help him meet his estimated needs. RD will change Glucerna to Nepro as this is lower in potassium and will provide additional calories and protein. Per chart, pt is down 35lbs(16%) over the past 4 months; this is significant weight loss. Suspect pt with poor appetite and oral intake at baseline. Per chart review, pt needs mechanical soft diet; RD will order.   Pt is at high risk for malnutrition   Medications reviewed and include: ferrous sulfate, vitamin C, insulin, ocuvite, protonix, Na bicarbonate, aztreonam, NaCl @50ml /hr   Labs reviewed: Na 132(L), BUN 102(H), creat 7.42(H) BNP- 2109(H)- 7/14 Hgb 9.9(L), Hct 29.8(L) cbgs- 114, 119 x 24 hrs AIC 7.5(H)- 4/24  NUTRITION - FOCUSED PHYSICAL EXAM: Unable to perform at this time   Diet  Order:   Diet Order            Diet heart healthy/carb modified Room service appropriate? Yes; Fluid consistency: Thin  Diet effective now                EDUCATION NEEDS:   No education needs have been identified at this time  Skin:  Skin Assessment: Reviewed RN Assessment (Stage II sacrum, ecchymosis)  Last BM:  7/14- type 6  Height:   Ht Readings from Last 1 Encounters:  01/23/20 6' (1.829 m)    Weight:   Wt Readings from Last 1 Encounters:  01/23/20 81.6 kg    Ideal Body Weight:  80.9 kg  BMI:  Body mass index is 24.41 kg/m.  Estimated Nutritional Needs:   Kcal:  2000-2300kcal/day  Protein:  85-100g/day  Fluid:  2L/day or per MD  Koleen Distance MS, RD, LDN Please refer to [facility] for RD and/or RD on-call/weekend/after hours pager

## 2020-01-24 NOTE — Progress Notes (Signed)
[facility]  ROUNDING NOTE   Subjective:  Patient well-known to [place]. Came in with decreased urine output. Patient has history of urothelial carcinoma of the bladder and is status post bilateral nephrostomy tubes. He had these tubes were placed approximately 1 month ago. Patient unable to offer any history at this point in time. BUN currently 102 with a creatinine of 7.42 and EGFR of 7. Case discussed in depth with Dr. Posey Pronto as well as patient's daughter.   Objective:  Vital signs in last 24 hours:  Temp:  [97.4 F (36.3 C)-98.2 F (36.8 C)] 97.8 F (36.6 C) (07/15 1138) Pulse Rate:  [80-121] 81 (07/15 1138) Resp:  [18-22] 20 (07/15 1138) BP: (117-137)/(77-96) 121/77 (07/15 1138) SpO2:  [94 %-100 %] 100 % (07/15 1138)  Weight change:  Filed Weights   01/23/20 0956  Weight: 81.6 kg    Intake/Output: I/O last 3 completed shifts: In: 998.8 [I.V.:848.8; IV Piggyback:150] Out: 270 [Urine:270]   Intake/Output this shift:  Total I/O In: 1025.2 [P.O.:390; I.V.:535.2; IV Piggyback:100] Out: 250 [Urine:250]  Physical Exam: General: No acute distress  Head: Normocephalic, atraumatic. Moist oral mucosal membranes  Eyes: Anicteric  Neck: Supple, trachea midline  Lungs:  Bilateral rales and rhonchi, slightly increased work of breathing  Heart: S1S2 no rubs  Abdomen:  Soft, nontender, bowel sounds present  Extremities: Trace peripheral edema.  Neurologic: Arousable but not following commands  Skin: No lesions       Basic Metabolic Panel: Recent Labs  Lab 01/23/20 0949 01/24/20 0438  NA 132* 132*  K 3.8 3.8  CL 85* 89*  CO2 27 27  GLUCOSE 134* 117*  BUN 106* 102*  CREATININE 7.58* 7.42*  CALCIUM 7.8* 7.7*    Liver Function Tests: Recent Labs  Lab 01/23/20 0949  AST 13*  ALT 9  ALKPHOS 70  BILITOT 0.9  PROT 7.2  ALBUMIN 3.0*   No results for input(s): LIPASE, AMYLASE in the last 168 hours. No results for input(s): AMMONIA in the last 168  hours.  CBC: Recent Labs  Lab 01/23/20 0949 01/24/20 0438  WBC 5.6 5.4  NEUTROABS 4.5  --   HGB 9.9* 9.9*  HCT 30.0* 29.8*  MCV 87.5 86.1  PLT 78* 70*    Cardiac Enzymes: No results for input(s): CKTOTAL, CKMB, CKMBINDEX, TROPONINI in the last 168 hours.  BNP: Invalid input(s): POCBNP  CBG: Recent Labs  Lab 01/23/20 1709 01/23/20 2135 01/24/20 0748 01/24/20 1134 01/24/20 1634  GLUCAP 109* 145* 114* 119* 150*    Microbiology: Results for orders placed or performed during the hospital encounter of 01/23/20  Urine culture     Status: None (Preliminary result)   Collection Time: 01/23/20  9:40 AM   Specimen: Urine, Random  Result Value Ref Range Status   Specimen Description   Final    URINE, RANDOM Performed at [facility], [address] [place], [place] [address]    Special Requests   Final    NONE Performed at [facility], [address], [place], [place] [address]    Culture   Final    CULTURE REINCUBATED FOR BETTER GROWTH Performed at [facility], [place] [address], [place],  [address]    Report Status PENDING  Incomplete  SARS Coronavirus 2 by RT PCR (hospital order, performed in [facility] hospital lab) Nasopharyngeal Nasopharyngeal Swab     Status: None   Collection Time: 01/23/20  4:00 PM   Specimen: Nasopharyngeal Swab  Result  Value Ref Range Status   SARS Coronavirus 2 NEGATIVE NEGATIVE Final    Comment: (NOTE) SARS-CoV-2 target nucleic acids are NOT DETECTED.  The SARS-CoV-2 RNA is generally detectable in upper and lower respiratory specimens during the acute phase of infection. The lowest concentration of SARS-CoV-2 viral copies this assay can detect is 250 copies / mL. A negative result does not preclude SARS-CoV-2 infection and should not be used as the sole basis for treatment or other patient management decisions.  A negative result may occur with improper specimen collection / handling, submission of  specimen other than nasopharyngeal swab, presence of viral mutation(s) within the areas targeted by this assay, and inadequate number of viral copies (<250 copies / mL). A negative result must be combined with clinical observations, patient history, and epidemiological information.  Fact Sheet for Patients:   StrictlyIdeas.no  Fact Sheet for Healthcare Providers: BankingDealers.co.za  This test is not yet approved or  cleared by the [place] FDA and has been authorized for detection and/or diagnosis of SARS-CoV-2 by FDA under an Emergency Use Authorization (EUA).  This EUA will remain in effect (meaning this test can be used) for the duration of the COVID-19 declaration under Section 564(b)(1) of the Act, 21 U.S.C. section 360bbb-3(b)(1), unless the authorization is terminated or revoked sooner.  Performed at [facility], [address] [place], [place] [address]   CULTURE, BLOOD (ROUTINE X 2) w Reflex to ID Panel     Status: None (Preliminary result)   Collection Time: 01/23/20  5:13 PM   Specimen: BLOOD  Result Value Ref Range Status   Specimen Description BLOOD BLOOD LEFT HAND  Final   Special Requests   Final    BOTTLES DRAWN AEROBIC AND ANAEROBIC Blood Culture adequate volume   Culture   Final    NO GROWTH < 24 HOURS Performed at [facility], [address], [place], [place] [address]    Report Status PENDING  Incomplete  CULTURE, BLOOD (ROUTINE X 2) w Reflex to ID Panel     Status: None (Preliminary result)   Collection Time: 01/23/20  6:12 PM   Specimen: BLOOD  Result Value Ref Range Status   Specimen Description BLOOD BLOOD LEFT WRIST  Final   Special Requests Blood Culture adequate volume  Final   Culture   Final    NO GROWTH < 24 HOURS Performed at [facility], [address] [place], [place] [address]    Report Status PENDING  Incomplete    Coagulation Studies: No results  for input(s): LABPROT, INR in the last 72 hours.  Urinalysis: Recent Labs    01/23/20 0940  COLORURINE AMBER*  LABSPEC 1.013  PHURINE 5.0  GLUCOSEU NEGATIVE  HGBUR LARGE*  BILIRUBINUR NEGATIVE  KETONESUR NEGATIVE  PROTEINUR 100*  NITRITE NEGATIVE  LEUKOCYTESUR LARGE*      Imaging: US Renal  Result Date: 01/23/2020 CLINICAL DATA:  Renal failure. History of bladder cancer. History of prostate cancer. EXAM: RENAL / URINARY TRACT ULTRASOUND COMPLETE COMPARISON:  CT scan 11/03/2019 FINDINGS: Right Kidney: Renal measurements: 8.3 x 4.0 x 4.0 cm = volume: 69.53 mL. Increased echogenicity and renal cortical scarring type changes. A right nephrostomy tube is noted. No hydronephrosis. No worrisome renal lesions are identified. There is a small cyst noted in the midpole region. Left Kidney: Renal measurements: 11.0 x 5.8 x 5.6 cm = volume: 185.88 mL. Slight increased echogenicity but normal renal cortical thickness. A nephrostomy tube is noted. No hydronephrosis. No worrisome  renal lesions. Bladder: Slightly thick-walled bladder with echogenic material which could represent hematoma or mass. Other: Small bilateral pleural effusions are noted along with trace ascites. IMPRESSION: 1. Small right kidney and normal size left kidney. 2. Bilateral nephrostomy tubes.  No hydronephrosis. 3. Indeterminate echogenic material in the bladder could be hematoma or mass. 4. Small bilateral pleural effusions. Electronically Signed   By: Marijo Sanes M.D.   On: 01/23/2020 12:49   DG Chest Port 1 View  Result Date: 01/23/2020 CLINICAL DATA:  Shortness of breath EXAM: PORTABLE CHEST 1 VIEW COMPARISON:  September 24, 2019 FINDINGS: There is stable cardiomegaly. Aortic knob calcifications. Hazy interstitial opacities are seen throughout both lungs right greater than left. There is a moderate right effusion present. Hazy airspace opacity at the right lung base, likely layering effusions/atelectasis. A left-sided pacemaker  again noted. No acute osseous abnormality. IMPRESSION: Findings suggestive of interstitial edema. Moderate right pleural effusion with adjacent hazy airspace opacity, likely layering effusions/atelectasis. Electronically Signed   By: Prudencio Pair M.D.   On: 01/23/2020 15:45     Medications:   . sodium chloride 50 mL/hr at 01/24/20 1717  . aztreonam Stopped (01/24/20 1527)   . ferrous sulfate  325 mg Oral Q breakfast   And  . vitamin C  250 mg Oral Q breakfast  . azelastine  1 spray Each Nare BID  . feeding supplement (NEPRO CARB STEADY)  237 mL Oral BID BM  . finasteride  5 mg Oral Daily  . insulin aspart  0-5 Units Subcutaneous QHS  . insulin aspart  0-9 Units Subcutaneous TID WC  . metoprolol tartrate  37.5 mg Oral BID  . multivitamin-lutein  1 capsule Oral BID  . pantoprazole  40 mg Oral Daily  . rosuvastatin  5 mg Oral QHS  . sodium bicarbonate  650 mg Oral BID  . tamsulosin  0.4 mg Oral Daily   acetaminophen, albuterol, ondansetron (ZOFRAN) IV  Assessment/ Plan:  86 y.o. male with past medical history of hypertension, hyperlipidemia, diabetes mellitus type 2, GERD, obstructive sleep apnea, urothelial carcinoma of the bladder status post bilateral nephrostomy tube placement, permanent pacemaker placement secondary to sick sinus syndrome, SVT, chronic kidney disease stage IV, thrombocytopenia, chronic respiratory failure who presented with decreased urine output.  1.  Acute kidney injury/chronic kidney disease stage IV/urothelial carcinoma status post bilateral nephrostomy tube placement.  The most recent creatinine we have prior to July was on 11/04/2019.  At that point time creatinine was 3.69 with an EGFR of 15.  Renal function has deteriorated significantly with a BUN of 102 and creatinine of 7.42 now.  Renal ultrasound was performed to make sure that there was no hydronephrosis and the study was negative for this.  Therefore he either now has acute kidney injury possibly due to  dehydration versus progression of his underlying chronic kidney disease.  We had a long discussion today including myself and the patient's daughter.  We talked about the potentials for further treatment including temporary dialysis.  Patient's family will consider goals of care amongst themselves and make a decision regarding additional treatment options including dialysis.  2.  Anemia of chronic kidney disease.  Hemoglobin 9.9.  No acute indication for Epogen at this time.  Continue to monitor CBC.   LOS: 1 Orion Vandervort 7/15/20215:57 PM

## 2020-01-25 ENCOUNTER — Encounter
Admission: EM | Disposition: A | Discharge status: Skilled Nursing Facility | Payer: Self-pay | Source: Home / Self Care | Attending: Internal Medicine

## 2020-01-25 DIAGNOSIS — I159 Secondary hypertension, unspecified: Secondary | ICD-10-CM | POA: Diagnosis not present

## 2020-01-25 DIAGNOSIS — E785 Hyperlipidemia, unspecified: Secondary | ICD-10-CM | POA: Diagnosis not present

## 2020-01-25 DIAGNOSIS — N179 Acute kidney failure, unspecified: Secondary | ICD-10-CM | POA: Diagnosis not present

## 2020-01-25 DIAGNOSIS — N189 Chronic kidney disease, unspecified: Secondary | ICD-10-CM

## 2020-01-25 DIAGNOSIS — R0602 Shortness of breath: Secondary | ICD-10-CM

## 2020-01-25 DIAGNOSIS — N184 Chronic kidney disease, stage 4 (severe): Secondary | ICD-10-CM

## 2020-01-25 DIAGNOSIS — I5032 Chronic diastolic (congestive) heart failure: Secondary | ICD-10-CM | POA: Diagnosis not present

## 2020-01-25 HISTORY — PX: TEMPORARY DIALYSIS CATHETER: CATH118312

## 2020-01-25 LAB — BASIC METABOLIC PANEL
Anion gap: 16 — ABNORMAL HIGH (ref 5–15)
BUN: 100 mg/dL — ABNORMAL HIGH (ref 8–23)
CO2: 27 mmol/L (ref 22–32)
Calcium: 7.6 mg/dL — ABNORMAL LOW (ref 8.9–10.3)
Chloride: 89 mmol/L — ABNORMAL LOW (ref 98–111)
Creatinine, Ser: 7.77 mg/dL — ABNORMAL HIGH (ref 0.61–1.24)
GFR calc Af Amer: 6 mL/min — ABNORMAL LOW (ref >60)
GFR calc non Af Amer: 5 mL/min — ABNORMAL LOW (ref >60)
Glucose, Bld: 101 mg/dL — ABNORMAL HIGH (ref 70–99)
Potassium: 4.1 mmol/L (ref 3.5–5.1)
Sodium: 132 mmol/L — ABNORMAL LOW (ref 135–145)

## 2020-01-25 LAB — GLUCOSE, CAPILLARY
Glucose-Capillary: 99 mg/dL (ref 70–99)
Glucose-Capillary: 83 mg/dL (ref 70–99)
Glucose-Capillary: 89 mg/dL (ref 70–99)
Glucose-Capillary: 133 mg/dL — ABNORMAL HIGH (ref 70–99)

## 2020-01-25 LAB — MRSA PCR SCREENING: MRSA by PCR: POSITIVE — AB

## 2020-01-25 SURGERY — TEMPORARY DIALYSIS CATHETER
Anesthesia: Moderate Sedation

## 2020-01-25 MED ORDER — CHLORHEXIDINE GLUCONATE CLOTH 2 % EX PADS
6.0000 | MEDICATED_PAD | Freq: Every day | CUTANEOUS | Status: DC
  Administered 2020-01-25 – 2020-01-30 (×3): 6 via TOPICAL

## 2020-01-25 MED ORDER — MUPIROCIN 2 % EX OINT
1.0000 "application " | TOPICAL_OINTMENT | Freq: Two times a day (BID) | CUTANEOUS | Status: AC
  Administered 2020-01-25 – 2020-01-28 (×4): 1 via NASAL
  Filled 2020-01-25: qty 22

## 2020-01-25 MED ORDER — CHLORHEXIDINE GLUCONATE CLOTH 2 % EX PADS
6.0000 | MEDICATED_PAD | Freq: Every day | CUTANEOUS | Status: DC
  Administered 2020-01-26 – 2020-01-27 (×2): 6 via TOPICAL

## 2020-01-25 MED ORDER — CHLORHEXIDINE GLUCONATE CLOTH 2 % EX PADS
6.0000 | MEDICATED_PAD | Freq: Every day | CUTANEOUS | Status: DC
  Administered 2020-01-26 – 2020-02-11 (×16): 6 via TOPICAL

## 2020-01-25 SURGICAL SUPPLY — 9 items
ELECT REM PT RETURN 9FT ADLT (ELECTROSURGICAL)
ELECTRODE REM PT RTRN 9FT ADLT (ELECTROSURGICAL) IMPLANT
KIT CATH DIALYSIS TRI 20X13 (CATHETERS) ×2 IMPLANT
PACK ANGIOGRAPHY (CUSTOM PROCEDURE TRAY) ×2 IMPLANT
PENCIL ELECTRO HAND CTR (MISCELLANEOUS) IMPLANT
SUT MNCRL AB 4-0 PS2 18 (SUTURE) ×2 IMPLANT
SUT PROLENE 0 CT 1 30 (SUTURE) IMPLANT
SUT VIC AB 3-0 SH 27 (SUTURE)
SUT VIC AB 3-0 SH 27X BRD (SUTURE) IMPLANT

## 2020-01-25 NOTE — Progress Notes (Addendum)
Patient ID: Blake [name], male   DOB: 23-Jun-1923, 86 y.o.   MRN: [id number]  Met with dter Dr Primus Bravo and pt in the room. They are interested in pursuing Hemodialysis. D/w patient in the room what is involved, what to expect along with risks and complications. Informed Dr Holley Raring about the decision for dialysis. Vascular consult placed for Dr Lucky Cowboy to get access.  25 mins  4:20 pm Daughter informed that HD access will be placed tonite by Dr Lucky Cowboy.

## 2020-01-25 NOTE — Progress Notes (Signed)
Patient refused oxygen therapy with nasal cannula. He usually wears 3L chronically but was 100% on room air when vitals were taken.  Christene Slates

## 2020-01-25 NOTE — TOC Initial Note (Signed)
Transition of Care [facility]) - Initial/Assessment Note    Patient Details  Name: Blake [name] MRN: [id number] Date of Birth: January 29, 1923  Transition of Care [facility] CM/SW Contact:    Beverly Sessions, RN Phone Number: 01/25/2020, 4:36 PM  Clinical Narrative:                 Patient admitted from home with renal failure Met with patient and family with MD, they have decided to purse HD  Patient lives at home with daughter and wife. Patient has private pay caregivers  PCP Caryl Comes - daughter denies issues with transportation  [facility] - denies issues obtaining medication  Patient we WC, RW, BSC, and shower seat in the home I have requested a PT eval  Per daughter patient has home health [facility].  I have reached out to [facility] to determine what servies patient has  Expected Discharge Plan: [place] Barriers to Discharge: Continued Medical Work up   Patient Goals and CMS Choice        Expected Discharge Plan and Services Expected Discharge Plan: [place]       Living arrangements for the past 2 months: Single Family Home                                      Prior Living Arrangements/Services Living arrangements for the past 2 months: Single Family Home Lives with:: Adult Children   Do you feel safe going back to the place where you live?: Yes      Need for Family Participation in Patient Care: Yes (Comment) Care giver support system in place?: Yes (comment) Current home services: DME Criminal Activity/Legal Involvement Pertinent to Current Situation/Hospitalization: No - Comment as needed  Activities of Daily Living Home Assistive Devices/Equipment: Raised toilet seat with rails, Bedside commode/3-in-1, Wheelchair, Environmental consultant (specify type) ADL Screening (condition at time of admission) Patient's cognitive ability adequate to safely complete daily activities?: No Is the patient deaf or have  difficulty hearing?: Yes (right side) Does the patient have difficulty seeing, even when wearing glasses/contacts?: Yes (degeneration of eyes) Does the patient have difficulty concentrating, remembering, or making decisions?: Yes Patient able to express need for assistance with ADLs?: No Does the patient have difficulty dressing or bathing?: Yes Independently performs ADLs?: No Communication: Needs assistance Is this a change from baseline?: Pre-admission baseline Dressing (OT): Needs assistance Is this a change from baseline?: Pre-admission baseline Grooming: Needs assistance Is this a change from baseline?: Pre-admission baseline Feeding: Needs assistance Is this a change from baseline?: Pre-admission baseline Bathing: Needs assistance Is this a change from baseline?: Pre-admission baseline Toileting: Needs assistance Is this a change from baseline?: Pre-admission baseline In/Out Bed: Needs assistance Is this a change from baseline?: Pre-admission baseline Walks in Home: Needs assistance Is this a change from baseline?: Pre-admission baseline Does the patient have difficulty walking or climbing stairs?: Yes Weakness of Legs: Both Weakness of Arms/Hands: Both  Permission Sought/Granted                  Emotional Assessment       Orientation: : Fluctuating Orientation (Suspected and/or reported Sundowners)   Psych Involvement: No (comment)  Admission diagnosis:  Weakness [R53.1] SOB (shortness of breath) [R06.02] Acute renal failure superimposed on stage 4 chronic kidney disease ([name]) [N17.9, N18.4] Acute renal failure superimposed on  chronic kidney disease, unspecified CKD stage, unspecified acute renal failure type ([place]) [N17.9, N18.9] Patient Active Problem List   Diagnosis Date Noted  . HTN (hypertension) 01/23/2020  . Type II diabetes mellitus with renal manifestations ([place]) 01/23/2020  . Acute respiratory failure with hypoxia ([place]) 01/23/2020  . Normocytic  anemia 01/23/2020  . Acute metabolic encephalopathy   . Urinary tract infection in elderly patient 11/04/2019  . AMS (altered mental status) 09/24/2019  . Acute renal failure superimposed on stage 4 chronic kidney disease ([place]) 09/24/2019  . Anemia 09/24/2019  . OSA (obstructive sleep apnea) 09/24/2019  . Seizure ([place]) 01/03/2019  . Chronic diastolic CHF (congestive heart failure) ([place]) 12/25/2018  . Acute renal failure (ARF) ([place]) 11/21/2018  . AKI (acute kidney injury) ([place])   . Goals of care, counseling/discussion   . Palliative care by specialist   . DNR (do not resuscitate) discussion   . Seizure-like activity ([place]) 11/05/2018  . Allergic rhinitis 07/24/2018  . Cardiac pacemaker in situ 07/24/2018  . History of sleep apnea 07/24/2018  . Macular degeneration 07/24/2018  . Trigeminal neuralgia pain 01/11/2018  . TMJ pain dysfunction syndrome 01/11/2018  . Recurrent UTI 09/26/2017  . Urothelial carcinoma of bladder ([place]) 09/26/2017  . Pressure injury, stage 3 ([place]) 09/12/2017  . SIRS (systemic inflammatory response syndrome) ([place]) 09/10/2017  . Chronic cystitis 08/25/2017  . Sepsis ([place]) 06/10/2017  . CAP (community acquired pneumonia) 06/10/2017  . Infection due to enterococcus 06/07/2017  . Blood in stool   . Hemorrhage of rectum and anus   . Lower GI bleed 10/20/2016  . Near syncope   . Acute renal failure superimposed on chronic kidney disease ([place])   . Pneumonia 10/09/2016  . Aspiration pneumonia ([place]) 10/09/2016  . Acute respiratory failure ([place]) 10/09/2016  . Hydronephrosis, bilateral 07/19/2016  . Spinal stenosis of lumbar region with neurogenic claudication 07/19/2016  . AAA (abdominal aortic aneurysm) without rupture ([place]) 04/27/2016  . Arthritis, degenerative 03/31/2016  . A-fib ([place]) 03/31/2016  . HLD (hyperlipidemia) 03/31/2016  . CA of prostate ([place]) 03/31/2016  . Central perforation of tympanic membrane of right ear 03/26/2016  . Mixed conductive and  sensorineural hearing loss of right ear with restricted hearing of left ear 03/01/2016  . Presbycusis of left ear with restricted hearing of right ear 03/01/2016  . Chronic kidney disease, stage 3 (moderate) 10/30/2015  . Episode of unresponsiveness 10/30/2015  . Sick sinus syndrome ([place]) 10/30/2015  . Thrombocytopenia, unspecified ([place]) 07/24/2015  . Fitting or adjustment of cardiac pacemaker 05/09/2015  . SVT (supraventricular tachycardia) ([place]) 04/09/2015  . Diabetes mellitus, type 2 ([place]) 04/09/2015  . Benign hypertension 04/09/2015  . Acute myocardial infarction, initial episode of care ([place]) 04/09/2015  . Phimosis 01/30/2014  . Bladder outlet obstruction 04/18/2013  . Dysuria 04/18/2013  . Gross hematuria 04/18/2013  . Incomplete emptying of bladder 11/26/2012  . Personal history of prostate cancer 11/26/2012   PCP:  Adin Hector, MD Pharmacy:   [facility], [place] [address] Phone: [phone] Fax: [place], [place] - [address] [place] [address] Phone: [phone] Fax: [phone]  [facility] [place] [place] - [place] [address] Phone: [phone] Fax: [phone]  [id number] [id number]: [phone]: [phone]  Social Determinants of Health (SDOH) Interventions    Readmission Risk Interventions Readmission Risk Prevention Plan 01/25/2020 09/25/2019 12/30/2018  Transportation Screening Complete Complete Complete  PCP or Specialist Appt within 3-5 Days - Complete -  [place] or [place] - Complete -  Social Work Consult for [place] Planning/Counseling - Complete -  Palliative Care Screening - Not Applicable -  Medication Review Press photographer) Complete Referral  to Pharmacy Complete  [place] or [place] Complete - [place] Patient Refused - -  Some recent data might be hidden

## 2020-01-25 NOTE — Progress Notes (Signed)
Pt. Transported back to room 216 per transport in stable condition, VSS.

## 2020-01-25 NOTE — Progress Notes (Signed)
Dr. Lucky Cowboy completed temp cath, pt. Remains same, VS are per pt. Baseline.  No complications noted.

## 2020-01-25 NOTE — Progress Notes (Signed)
   01/25/20 2129  Assess: MEWS Score  Temp 97.8 F (36.6 C)  BP (!) 131/100  Pulse Rate (!) 119  Resp (!) 24  SpO2 100 %  Assess: MEWS Score  MEWS Temp 0  MEWS Systolic 0  MEWS Pulse 2  MEWS RR 1  MEWS LOC 0  MEWS Score 3  MEWS Score Color Yellow  Assess: if the MEWS score is Yellow or Red  Were vital signs taken at a resting state? Yes  Focused Assessment Documented focused assessment  Early Detection of Sepsis Score *See Row Information* High  MEWS guidelines implemented *See Row Information* No, previously yellow, continue vital signs every 4 hours   MEWS previously yellow/red. Will continue Q4 vitals signs. Discussed with NP Randol Kern and [employer], metoprolol dose this AM not given, evening dose given, will recheck.

## 2020-01-25 NOTE — Consult Note (Signed)
[facility] Consult Note  MRN : [id number]  Blake [name] is a 86 y.o. (04-04-1923) male who presents with chief complaint of  Chief Complaint  Patient presents with   Urinary Retention  .  History of Present Illness: Patient is admitted to the hospital with issues related to his nephrostomy tube and worsening renal function and has developed acute renal failure.  The patient cannot provide any history at current, and this is obtained from his daughter who we have known for many years.  I have previously taken care of the patient for his aneurysm and other issues that we have followed.  They are very good and pleasant people.  The patient is critically ill with some volume overload and uremia. The nephrology service has decided to initiate dialysis at this time, and we are asked to place a temporary dialysis catheter for immediate dialysis use.    Current Facility-Administered Medications  Medication Dose Route Frequency Provider Last Rate Last Admin   0.9 %  sodium chloride infusion   Intravenous Continuous Ivor Costa, MD   Stopped at 01/25/20 1351   acetaminophen (TYLENOL) tablet 650 mg  650 mg Oral Q6H PRN Ivor Costa, MD       albuterol (PROVENTIL) (2.5 MG/3ML) 0.083% nebulizer solution 2.5 mg  2.5 mg Nebulization Q4H PRN Ivor Costa, MD       ferrous sulfate tablet 325 mg  325 mg Oral Q breakfast Ivor Costa, MD   325 mg at 01/24/20 5053   And   ascorbic acid (VITAMIN C) tablet 250 mg  250 mg Oral Q breakfast Ivor Costa, MD   250 mg at 01/24/20 0850   azelastine (ASTELIN) 0.1 % nasal spray 1 spray  1 spray Each Nare BID Ivor Costa, MD   1 spray at 01/24/20 2109   aztreonam (AZACTAM) 0.5 g in dextrose 5 % 50 mL IVPB  0.5 g Intravenous Cleophas Dunker, MD   Stopped at 01/25/20 1421   feeding supplement (NEPRO CARB STEADY) liquid 237 mL  237 mL Oral BID BM Fritzi Mandes, MD   237 mL at 01/25/20 0831   finasteride (PROSCAR) tablet 5 mg  5 mg Oral Daily  Ivor Costa, MD   5 mg at 01/24/20 0911   insulin aspart (novoLOG) injection 0-5 Units  0-5 Units Subcutaneous QHS Ivor Costa, MD       insulin aspart (novoLOG) injection 0-9 Units  0-9 Units Subcutaneous TID WC Ivor Costa, MD   1 Units at 01/24/20 1711   metoprolol tartrate (LOPRESSOR) tablet 37.5 mg  37.5 mg Oral BID Ivor Costa, MD   37.5 mg at 01/24/20 2108   multivitamin-lutein (OCUVITE-LUTEIN) capsule 1 capsule  1 capsule Oral BID Ivor Costa, MD   1 capsule at 01/24/20 2108   ondansetron (ZOFRAN) injection 4 mg  4 mg Intravenous Q8H PRN Ivor Costa, MD       pantoprazole (PROTONIX) EC tablet 40 mg  40 mg Oral Daily Ivor Costa, MD   40 mg at 01/24/20 0911   rosuvastatin (CRESTOR) tablet 5 mg  5 mg Oral QHS Ivor Costa, MD   5 mg at 01/24/20 2109   sodium bicarbonate tablet 650 mg  650 mg Oral BID Ivor Costa, MD   650 mg at 01/24/20 2108   tamsulosin (FLOMAX) capsule 0.4 mg  0.4 mg Oral Daily Ivor Costa, MD   0.4 mg at 01/24/20 9767    Past Medical History:  Diagnosis Date   AAA (  abdominal aortic aneurysm) (HCC)    Arrhythmia    CHF (congestive heart failure) (HCC)    Diabetes mellitus without complication (HCC)    GERD (gastroesophageal reflux disease)    GI bleed    Heart murmur    Hyperlipidemia    Hypertension    Pacemaker    Pneumonia    Prostate cancer ([place])    Sleep apnea     Past Surgical History:  Procedure Laterality Date   FLEXIBLE SIGMOIDOSCOPY N/A 10/21/2016   Procedure: FLEXIBLE SIGMOIDOSCOPY;  Surgeon: [name], MD;  Location: [facility];  Service: Endoscopy;  Laterality: N/A;   INSERT / REPLACE / REMOVE PACEMAKER     IR NEPHROSTOGRAM LEFT THRU EXISTING ACCESS  01/16/2019   IR NEPHROSTOMY EXCHANGE LEFT  02/06/2019   IR NEPHROSTOMY EXCHANGE LEFT  04/12/2019   IR NEPHROSTOMY EXCHANGE LEFT  06/01/2019   IR NEPHROSTOMY EXCHANGE LEFT  07/20/2019   IR NEPHROSTOMY EXCHANGE LEFT  09/19/2019   IR NEPHROSTOMY EXCHANGE LEFT  11/09/2019   IR  NEPHROSTOMY EXCHANGE LEFT  12/28/2019   IR NEPHROSTOMY EXCHANGE RIGHT  02/06/2019   IR NEPHROSTOMY EXCHANGE RIGHT  04/12/2019   IR NEPHROSTOMY EXCHANGE RIGHT  06/01/2019   IR NEPHROSTOMY EXCHANGE RIGHT  07/20/2019   IR NEPHROSTOMY EXCHANGE RIGHT  09/19/2019   IR NEPHROSTOMY EXCHANGE RIGHT  11/09/2019   IR NEPHROSTOMY EXCHANGE RIGHT  12/28/2019   IR NEPHROSTOMY PLACEMENT RIGHT  01/01/2019   JOINT REPLACEMENT     NEPHROSTOMY Bilateral    PACEMAKER INSERTION Left 04/14/2015   Procedure: INSERTION PACEMAKER;  Surgeon: Isaias Cowman, MD;  Location: [facility];  Service: Cardiovascular;  Laterality: Left;   PROSTATE SURGERY     VISCERAL ARTERY INTERVENTION N/A 10/22/2016   Procedure: Visceral Artery Intervention;  Surgeon: Algernon Huxley, MD;  Location: [facility];  Service: Cardiovascular;  Laterality: N/A;     Social History   Tobacco Use   Smoking status: Former Smoker   Smokeless tobacco: Never Used  Scientific laboratory technician Use: Never used  Substance Use Topics   Alcohol use: No   Drug use: No     Family History  Problem Relation Age of Onset   Hypertension Other    Stroke Other    Heart attack Other    Stroke Sister    Heart attack Brother   No bleeding or clotting disorders  Allergies  Allergen Reactions   Cefepime     seizure   Diovan [Valsartan] Cough   Gabapentin Other (See Comments)    Sedation at all doses   Hydralazine Itching   Lisinopril Cough   Pregabalin Other (See Comments)    Sedation at all doses   Levofloxacin Other (See Comments)    Too many side effects. Nausea, vomiting, upset stomach, increased confusion, etc Other reaction(s): Confusion Nausea, chills Nausea, chills    Sulfamethoxazole-Trimethoprim Other (See Comments)    Too many side effects. Nausea, vomiting, upset stomach, increased confusion, etc     REVIEW OF SYSTEMS (Negative unless checked)  Constitutional: [] Weight loss  [] Fever  [] Chills Cardiac:  [] Chest pain   [] Chest pressure   [] Palpitations   [] Shortness of breath when laying flat   [] Shortness of breath at rest   [x] Shortness of breath with exertion. Vascular:  [] Pain in legs with walking   [] Pain in legs at rest   [] Pain in legs when laying flat   [] Claudication   [] Pain in feet when walking  [] Pain in feet at rest  []   Pain in feet when laying flat   [] History of DVT   [] Phlebitis   [] Swelling in legs   [] Varicose veins   [] Non-healing ulcers Pulmonary:   [] Uses home oxygen   [] Productive cough   [] Hemoptysis   [] Wheeze  [] COPD   [] Asthma Neurologic:  [] Dizziness  [] Blackouts   [] Seizures   [] History of stroke   [] History of TIA  [] Aphasia   [] Temporary blindness   [] Dysphagia   [] Weakness or numbness in arms   [] Weakness or numbness in legs Musculoskeletal:  [x] Arthritis   [] Joint swelling   [x] Joint pain   [] Low back pain Hematologic:  [] Easy bruising  [] Easy bleeding   [] Hypercoagulable state   [] Anemic  [] Hepatitis Gastrointestinal:  [] Blood in stool   [] Vomiting blood  [] Gastroesophageal reflux/heartburn   [] Difficulty swallowing. Genitourinary:  [x] Chronic kidney disease   [x] Difficult urination  [] Frequent urination  [] Burning with urination   [] Blood in urine Skin:  [] Rashes   [] Ulcers   [] Wounds Psychological:  [] History of anxiety   []  History of major depression.       Physical Examination  Vitals:   01/25/20 0500 01/25/20 1137 01/25/20 1205 01/25/20 1210  BP:  (!) 123/100 127/89   Pulse:  (!) 112 (!) 116 93  Resp:  14 20   Temp:  (!) 97.4 F (36.3 C) 97.6 F (36.4 C)   TempSrc:  Oral Oral   SpO2:  (!) 83% 99%   Weight: 96.9 kg     Height:       Body mass index is 28.97 kg/m. Gen: WD/WN Head: [place]/AT, + temporalis wasting Ear/Nose/Throat: Hearing grossly intact, nares w/o erythema or drainage Eyes: Sclera non-icteric, conjunctiva clear Neck: Supple, no nuchal rigidity.  No JVD.  Pulmonary:  Good air movement, diminished in the bases Cardiac: Irregular .   Musculoskeletal: M/S 5/5 throughout.  Extremities without ischemic changes.  No deformity or atrophy.  Moderate edema in the lower extremities bilaterally Neurologic: Difficult to assess but does not appear to have focal deficits Psychiatric: Difficult to assess due to the severity of patient's illness. Dermatologic: No rashes or ulcers noted.         CBC Lab Results  Component Value Date   WBC 5.4 01/24/2020   HGB 9.9 (L) 01/24/2020   HCT 29.8 (L) 01/24/2020   MCV 86.1 01/24/2020   PLT 70 (L) 01/24/2020    BMET    Component Value Date/Time   NA 132 (L) 01/25/2020 0457   NA 141 12/21/2018 1429   NA 137 06/16/2014 1337   K 4.1 01/25/2020 0457   K 4.3 06/16/2014 1337   CL 89 (L) 01/25/2020 0457   CL 100 06/16/2014 1337   CO2 27 01/25/2020 0457   CO2 27 06/16/2014 1337   GLUCOSE 101 (H) 01/25/2020 0457   GLUCOSE 215 (H) 06/16/2014 1337   BUN 100 (H) 01/25/2020 0457   BUN 68 (H) 12/21/2018 1429   BUN 29 (H) 06/16/2014 1337   CREATININE 7.77 (H) 01/25/2020 0457   CREATININE 1.48 (H) 06/16/2014 1337   CALCIUM 7.6 (L) 01/25/2020 0457   CALCIUM 8.4 (L) 06/16/2014 1337   GFRNONAA 5 (L) 01/25/2020 0457   GFRNONAA 48 (L) 06/16/2014 1337   GFRNONAA 48 (L) 12/29/2012 1722   GFRAA 6 (L) 01/25/2020 0457   GFRAA 58 (L) 06/16/2014 1337   GFRAA 56 (L) 12/29/2012 1722   Estimated Creatinine Clearance: 6.9 mL/min (A) (by C-G formula based on SCr of 7.77 mg/dL (H)).  COAG Lab Results  Component  Value Date   INR 1.3 (H) 01/02/2019   INR 1.4 (H) 12/26/2018   INR 1.26 06/10/2017    Radiology US Renal  Result Date: 01/23/2020 CLINICAL DATA:  Renal failure. History of bladder cancer. History of prostate cancer. EXAM: RENAL / URINARY TRACT ULTRASOUND COMPLETE COMPARISON:  CT scan 11/03/2019 FINDINGS: Right Kidney: Renal measurements: 8.3 x 4.0 x 4.0 cm = volume: 69.53 mL. Increased echogenicity and renal cortical scarring type changes. A right nephrostomy tube is noted. No  hydronephrosis. No worrisome renal lesions are identified. There is a small cyst noted in the midpole region. Left Kidney: Renal measurements: 11.0 x 5.8 x 5.6 cm = volume: 185.88 mL. Slight increased echogenicity but normal renal cortical thickness. A nephrostomy tube is noted. No hydronephrosis. No worrisome renal lesions. Bladder: Slightly thick-walled bladder with echogenic material which could represent hematoma or mass. Other: Small bilateral pleural effusions are noted along with trace ascites. IMPRESSION: 1. Small right kidney and normal size left kidney. 2. Bilateral nephrostomy tubes.  No hydronephrosis. 3. Indeterminate echogenic material in the bladder could be hematoma or mass. 4. Small bilateral pleural effusions. Electronically Signed   By: Marijo Sanes M.D.   On: 01/23/2020 12:49   DG Chest Port 1 View  Result Date: 01/23/2020 CLINICAL DATA:  Shortness of breath EXAM: PORTABLE CHEST 1 VIEW COMPARISON:  September 24, 2019 FINDINGS: There is stable cardiomegaly. Aortic knob calcifications. Hazy interstitial opacities are seen throughout both lungs right greater than left. There is a moderate right effusion present. Hazy airspace opacity at the right lung base, likely layering effusions/atelectasis. A left-sided pacemaker again noted. No acute osseous abnormality. IMPRESSION: Findings suggestive of interstitial edema. Moderate right pleural effusion with adjacent hazy airspace opacity, likely layering effusions/atelectasis. Electronically Signed   By: Prudencio Pair M.D.   On: 01/23/2020 15:45   IR NEPHROSTOMY EXCHANGE LEFT  Result Date: 12/28/2019 INDICATION: Bilateral hydronephrosis, chronic indwelling nephrostomies, routine exchange EXAM: FLUOROSCOPIC EXCHANGE OF THE BILATERAL 12 FRENCH NEPHROSTOMIES COMPARISON:  11/09/2019 MEDICATIONS: 1% LIDOCAINE LOCAL ANESTHESIA/SEDATION: Moderate Sedation Time:  None. The patient was continuously monitored during the procedure by the interventional radiology  nurse under my direct supervision. CONTRAST:  10 cc-administered into the collecting system(s) FLUOROSCOPY TIME:  Fluoroscopy Time: 2 minutes 56 seconds (37 mGy). COMPLICATIONS: None immediate. PROCEDURE: Informed written consent was obtained from the patient's daughter after a thorough discussion of the procedural risks, benefits and alternatives. All questions were addressed. Maximal Sterile Barrier Technique was utilized including caps, mask, sterile gowns, sterile gloves, sterile drape, hand hygiene and skin antiseptic. A timeout was performed prior to the initiation of the procedure. Left nephrostomy exchange: Under sterile conditions and local anesthesia, the left nephrostomy catheter had retracted into an upper pole calyx and was partially occluded. Access had to be removed. Percutaneous tract recannulated with a Kumpe catheter. Access advanced into the renal pelvis. Contrast injection confirms position. Over an Amplatz guidewire, a new 12 French nephrostomy was advanced. Retention loop formed the renal pelvis. Contrast injection confirms position. Catheter secured with Prolene suture and external gravity drainage bag. Sterile dressing applied. No immediate complication. Right nephrostomy exchange: Under sterile conditions and local anesthesia, the right nephrostomy catheter was exchanged over an Amplatz guidewire without difficulty for a new 12 French catheter. Retention loop formed the renal pelvis. Contrast injection confirmed position. Catheter secured with Prolene suture and a gravity drainage bag. Sterile dressing applied. No immediate complication. IMPRESSION: Successful bilateral 12 [place] nephrostomy exchanges Electronically Signed   By: Jerilynn Mages.  Shick  M.D.   On: 12/28/2019 11:13   IR NEPHROSTOMY EXCHANGE RIGHT  Result Date: 12/28/2019 INDICATION: Bilateral hydronephrosis, chronic indwelling nephrostomies, routine exchange EXAM: FLUOROSCOPIC EXCHANGE OF THE BILATERAL 12 FRENCH NEPHROSTOMIES  COMPARISON:  11/09/2019 MEDICATIONS: 1% LIDOCAINE LOCAL ANESTHESIA/SEDATION: Moderate Sedation Time:  None. The patient was continuously monitored during the procedure by the interventional radiology nurse under my direct supervision. CONTRAST:  10 cc-administered into the collecting system(s) FLUOROSCOPY TIME:  Fluoroscopy Time: 2 minutes 56 seconds (37 mGy). COMPLICATIONS: None immediate. PROCEDURE: Informed written consent was obtained from the patient's daughter after a thorough discussion of the procedural risks, benefits and alternatives. All questions were addressed. Maximal Sterile Barrier Technique was utilized including caps, mask, sterile gowns, sterile gloves, sterile drape, hand hygiene and skin antiseptic. A timeout was performed prior to the initiation of the procedure. Left nephrostomy exchange: Under sterile conditions and local anesthesia, the left nephrostomy catheter had retracted into an upper pole calyx and was partially occluded. Access had to be removed. Percutaneous tract recannulated with a Kumpe catheter. Access advanced into the renal pelvis. Contrast injection confirms position. Over an Amplatz guidewire, a new 12 French nephrostomy was advanced. Retention loop formed the renal pelvis. Contrast injection confirms position. Catheter secured with Prolene suture and external gravity drainage bag. Sterile dressing applied. No immediate complication. Right nephrostomy exchange: Under sterile conditions and local anesthesia, the right nephrostomy catheter was exchanged over an Amplatz guidewire without difficulty for a new 12 French catheter. Retention loop formed the renal pelvis. Contrast injection confirmed position. Catheter secured with Prolene suture and a gravity drainage bag. Sterile dressing applied. No immediate complication. IMPRESSION: Successful bilateral 12 [place] nephrostomy exchanges Electronically Signed   By: Jerilynn Mages.  Shick M.D.   On: 12/28/2019 11:13      Assessment/Plan 1.  ARF superimposed on severe chronic kidney disease.  May possibly be end-stage renal disease now. 2.  Abdominal aortic aneurysm.  Measured 5.4 cm at last check.  Given his age and extensive comorbidities, we will continue to monitor this for now. 3.  Anemia of chronic disease.  Stable.  Minimal blood loss expected with procedure. 4.  Hypertension. Blood pressure control important in reducing the progression of atherosclerotic disease. On appropriate oral medications. 5.  Diabetes. Blood glucose control important in reducing the progression of atherosclerotic disease. Also, involved in wound healing. On appropriate medications.     We will proceed with temporary dialysis catheter placement at this time.  Risks and benefits discussed with patient and/or family, and the catheter will be placed to allow immediate initiation of dialysis.  If the patient's renal function does not improve throughout the hospital course, we will be happy to place a tunneled dialysis catheter for long term use prior to discharge.     Leotis Pain, MD  01/25/2020 4:49 PM

## 2020-01-25 NOTE — Progress Notes (Signed)
[name] NAME: Blake [name]    MR#:  [id number]  DATE OF BIRTH:  1922/12/30  SUBJECTIVE:  appears very lethargic. Per RN poor PO intake. not taking his meds. Not much interacting this morning.  REVIEW OF SYSTEMS:   Review of Systems  Unable to perform ROS: Mental status change   Tolerating Diet: Tolerating PT:   DRUG ALLERGIES:   Allergies  Allergen Reactions  . Cefepime     seizure  . Diovan [Valsartan] Cough  . Gabapentin Other (See Comments)    Sedation at all doses  . Hydralazine Itching  . Lisinopril Cough  . Pregabalin Other (See Comments)    Sedation at all doses  . Levofloxacin Other (See Comments)    Too many side effects. Nausea, vomiting, upset stomach, increased confusion, etc Other reaction(s): Confusion Nausea, chills Nausea, chills   . Sulfamethoxazole-Trimethoprim Other (See Comments)    Too many side effects. Nausea, vomiting, upset stomach, increased confusion, etc    VITALS:  Blood pressure 127/89, pulse 93, temperature 97.6 F (36.4 C), temperature source Oral, resp. rate 20, height 6' (1.829 m), weight 96.9 kg, SpO2 99 %.  PHYSICAL EXAMINATION:   Physical Exam limited exam due to patient participation and decline in condition GENERAL:  86 y.o.-year-old patient lying in the bed with no acute distress. Chronically ill and debilitated LUNGS: coarse breath sounds bilaterally, no wheezing, rales, rhonchi. No use of accessory muscles of respiration.  CARDIOVASCULAR: S1, S2 normal. No murmurs, rubs, or gallops.  ABDOMEN: Soft, nontender, nondistended. Bowel sounds present. No organomegaly or mass. Chronic Bilateral nephrostomy tubes+ EXTREMITIES: No cyanosis, clubbing or edema b/l.    NEUROLOGIC: unable to assess. Patient quite weak and lethargic  PSYCHIATRIC:  patient is lethargic SKIN: Pressure Injury 11/21/18 Buttocks Bilateral;Left;Right Stage II -  Partial thickness loss of dermis  presenting as a shallow open ulcer with a red, pink wound bed without slough. pink (Active)  11/21/18 2350  Location: Buttocks  Location Orientation: Bilateral;Left;Right  Staging: Stage II -  Partial thickness loss of dermis presenting as a shallow open ulcer with a red, pink wound bed without slough.  Wound Description (Comments): pink  Present on Admission: Yes     Pressure Injury 01/01/19 Buttocks Left Stage I -  Intact skin with non-blanchable redness of a localized area usually over a bony prominence. (Active)  01/01/19 0805  Location: Buttocks  Location Orientation: Left  Staging: Stage I -  Intact skin with non-blanchable redness of a localized area usually over a bony prominence.  Wound Description (Comments):   Present on Admission: Yes     Pressure Injury 09/25/19 Sacrum Stage 3 -  Full thickness tissue loss. Subcutaneous fat may be visible but bone, tendon or muscle are NOT exposed. 1cm x 1cm . Cleansed and Foam applied (Active)  09/25/19 0200  Location: Sacrum  Location Orientation:   Staging: Stage 3 -  Full thickness tissue loss. Subcutaneous fat may be visible but bone, tendon or muscle are NOT exposed.  Wound Description (Comments): 1cm x 1cm . Cleansed and Foam applied  Present on Admission: Yes     Pressure Injury 09/25/19 Heel Right Deep Tissue Pressure Injury - Purple or maroon localized area of discolored intact skin or blood-filled blister due to damage of underlying soft tissue from pressure and/or shear. Right heel is purple. Skin intact. Ele (Active)  09/25/19 0200  Location: Heel  Location Orientation: Right  Staging: Deep Tissue Pressure  Injury - Purple or maroon localized area of discolored intact skin or blood-filled blister due to damage of underlying soft tissue from pressure and/or shear.  Wound Description (Comments): Right heel is purple. Skin intact. Elevated on 2 pillows.  Present on Admission: Yes     Pressure Injury 01/23/20 Sacrum Medial Stage 2  -  Partial thickness loss of dermis presenting as a shallow open injury with a red, pink wound bed without slough. st 2 to right cheek (Active)  01/23/20 1647  Location: Sacrum  Location Orientation: Medial  Staging: Stage 2 -  Partial thickness loss of dermis presenting as a shallow open injury with a red, pink wound bed without slough.  Wound Description (Comments): st 2 to right cheek  Present on Admission: Yes      LABORATORY PANEL:  CBC Recent Labs  Lab 01/24/20 0438  WBC 5.4  HGB 9.9*  HCT 29.8*  PLT 70*    Chemistries  Recent Labs  Lab 01/23/20 0949 01/24/20 0438 01/25/20 0457  NA 132*   < > 132*  K 3.8   < > 4.1  CL 85*   < > 89*  CO2 27   < > 27  GLUCOSE 134*   < > 101*  BUN 106*   < > 100*  CREATININE 7.58*   < > 7.77*  CALCIUM 7.8*   < > 7.6*  AST 13*  --   --   ALT 9  --   --   ALKPHOS 70  --   --   BILITOT 0.9  --   --    < > = values in this interval not displayed.   Cardiac Enzymes No results for input(s): TROPONINI in the last 168 hours. RADIOLOGY:  DG Chest Port 1 View  Result Date: 01/23/2020 CLINICAL DATA:  Shortness of breath EXAM: PORTABLE CHEST 1 VIEW COMPARISON:  September 24, 2019 FINDINGS: There is stable cardiomegaly. Aortic knob calcifications. Hazy interstitial opacities are seen throughout both lungs right greater than left. There is a moderate right effusion present. Hazy airspace opacity at the right lung base, likely layering effusions/atelectasis. A left-sided pacemaker again noted. No acute osseous abnormality. IMPRESSION: Findings suggestive of interstitial edema. Moderate right pleural effusion with adjacent hazy airspace opacity, likely layering effusions/atelectasis. Electronically Signed   By: Prudencio Pair M.D.   On: 01/23/2020 15:45   ASSESSMENT AND PLAN:   Blake [name] is a 86 y.o. male with medical history significant of hypertension, hyperlipidemia, diabetes mellitus, GERD, OSA, prostate cancer, urothelial carcinoma of the  bladder, s/p of bilateral nephrostomy, pacemaker placement due to SSS, GI bleeding, dCHF, SVT, CKD-IV, thrombocytopenia, chronic respiratory failure on 2 L nasal cannula oxygen at night at home, who presents with decreased urine output.  Acute renal failure superimposed on stage 4 chronic kidney disease ([place]): Cre is up from 3.69 on 11/04/19 -->7.58, BUN 106. --upto 7.77 - Renal ultrasound showed no hydronephrosis. -Nephrology consultation with Dr. Holley Raring-- appears renal function has deteriorated causing uremia/encephalopathy/poor urine output/overall decline -IV fluids-- cautiously watch for respiratory decompensation given history of CHF -hold torsemide -receiving IVF -Avoid using renal toxic medications -f/u Dr. Holley Raring -- who discussed at length with Dr. Primus Bravo daughter this morning. Family is leaning towards Palliative and Hospice care -- Overall poor prognosis  Possible recurrent UTI -aztreonam -f/u blood  Culture negative - urine culture reincubated  SVT (supraventricular tachycardia) (HCC) -Metoprolol--not taking it  HLD (hyperlipidemia) -Crestor  Urothelial carcinoma of bladder and hx of prostate cancer -  s/p bilateral nephrostomy -Proscar and Flomax  Acute respiratory failure with hypoxia: Likely due to fluid overload.  Patient has 2+ leg edema -As needed albuterol -Nasal cannula oxygen to maintain oxygen saturation above 49%  Chronic diastolic CHF (congestive heart failure) ([place]): 2D echo on 11/22/2018 showed EF 55-60%.  Patient has elevated BNP 2109, 2+ leg edema, indicating fluid overload.  Since patient has worsening renal function, will hold diuretics -Hold torsemide -Nasal cannula oxygen to maintain oxygen saturation above 93%  HTN (hypertension) -As needed hydralazine -Metoprolol  Type II diabetes mellitus with renal manifestations [facility]): Recent A1c 7.5, patient is taking Januvia -SSI  Normocytic anemia: Hemoglobin 9.9 which was a 10.9 on 4/20/4/21.  No  active bleeding tendency. -Follow-up with CBC  Chronic decubitus pressure ulcers present on admission -follow-up wound care   DVT ppx: SCD Code Status: Full code per his son and daughter Family Communication:  to meet with dter today at 3:30 pm Disposition Plan:  Anticipate discharge back to previous environment  Consults called:  Dr. Holley Raring of renal Admission status: Med-surg bed as inpt     Status is: Inpatient  Dispo: The patient is from: Home  Anticipated d/c is to: Home  Anticipated d/c date is: 2 days  Patient currently is not medically stable to d/c. worsening kidney disease. To meet with family today to discuss further plan of care. Family leaning towards hospice  TOTAL TIME TAKING CARE OF THIS PATIENT: *25* minutes.  >50% time spent on counselling and coordination of care  Note: This dictation was prepared with Dragon dictation along with smaller phrase technology. Any transcriptional errors that result from this process are unintentional.  Fritzi Mandes M.D    [facility]   CC: Primary care physician; Adin Hector, MDPatient ID: Blake [name], male   DOB: 03-06-1923, 86 y.o.   MRN: [id number]

## 2020-01-25 NOTE — Progress Notes (Signed)
Patient reevaluated this afternoon.  Case discussed with Dr. Posey Pronto as well as patient's daughter, Dr. Primus Bravo.  They have changed their decision regarding palliative care and now would like to proceed with hemodialysis.  Therefore I urgently contacted Dr. Lucky Cowboy to place temporary dialysis catheter.  He has graciously agreed to place temporary dialysis catheter late this afternoon.  We will plan for first dialysis treatment tomorrow.  Further plan as patient progresses.  We will determine how well the patient tolerates hemodialysis treatment.

## 2020-01-25 NOTE — Op Note (Signed)
  OPERATIVE NOTE   PROCEDURE: 1. Ultrasound guidance for vascular access right femoral vein 2. Placement of a 20 Demeter triple-lumen dialysis catheter right femoral vein  PRE-OPERATIVE DIAGNOSIS: 1. Acute renal failure 2. Severe chronic kidney disease, now possibly being end-stage renal disease  POST-OPERATIVE DIAGNOSIS: Same  SURGEON: Leotis Pain, MD  ASSISTANT(S): None  ANESTHESIA: local  ESTIMATED BLOOD LOSS: 10 cc  FINDING(S): 1. None  SPECIMEN(S): None  INDICATIONS:  Patient is a 86 y.o.male who presents with progression of renal failure and now needs dialysis. The family's decided to proceed with dialysis and a temporary catheter is necessary at this point.  Risks and benefits were discussed, and informed consent was obtained.  DESCRIPTION: After obtaining full informed written consent, the patient was laid flat in the bed. The right groin was sterilely prepped and draped in a sterile surgical field was created. The right femoral vein was visualized with ultrasound and found to be widely patent. It was then accessed under direct guidance without difficulty with a Seldinger needle and a permanent image was recorded. A J-wire was then placed. After skin nick and dilatation, a 20 cm triple-lumen dialysis catheter was placed over the wire and the wire was removed. The lumens withdrew dark red nonpulsatile blood and flushed easily with sterile saline. The catheter was secured to the skin with 3 nylon sutures. Sterile dressing was placed.  COMPLICATIONS: None  CONDITION: Stable  Leotis Pain 01/25/2020 5:14 PM  This note was created with Dragon Medical transcription system. Any errors in dictation are purely unintentional.

## 2020-01-25 NOTE — Progress Notes (Signed)
[facility]  ROUNDING NOTE   Subjective:  Patient arousable today but still quite confused. Urine output was 350 cc over the preceding 24 hours. Renal function remains quite low with BUN of 100 and creatinine of 7.7 and EGFR of 6. We had a long discussion with the patient's daughter today. She is considering moving forward with palliative and hospice care.   Objective:  Vital signs in last 24 hours:  Temp:  [97.5 F (36.4 C)-97.8 F (36.6 C)] 97.5 F (36.4 C) (07/16 0326) Pulse Rate:  [80-95] 95 (07/16 0326) Resp:  [19-20] 20 (07/16 0326) BP: (115-121)/(77-88) 115/83 (07/16 0326) SpO2:  [100 %] 100 % (07/16 0326) Weight:  [96.9 kg] 96.9 kg (07/16 0500)  Weight change: 15.3 kg Filed Weights   01/23/20 0956 01/25/20 0500  Weight: 81.6 kg 96.9 kg    Intake/Output: I/O last 3 completed shifts: In: 2594.4 [P.O.:390; I.V.:1904.4; IV Piggyback:300] Out: 620 [Urine:620]   Intake/Output this shift:  Total I/O In: 0  Out: 50 [Urine:50]  Physical Exam: General: No acute distress  Head: Normocephalic, atraumatic. Moist oral mucosal membranes  Eyes: Anicteric  Neck: Supple, trachea midline  Lungs:  Bilateral rales and rhonchi  Heart: S1S2 no rubs  Abdomen:  Soft, nontender, bowel sounds present  Extremities: Trace peripheral edema.  Neurologic: Awake, follows commands, confused  Skin: No lesions       Basic Metabolic Panel: Recent Labs  Lab 01/23/20 0949 01/24/20 0438 01/25/20 0457  NA 132* 132* 132*  K 3.8 3.8 4.1  CL 85* 89* 89*  CO2 _0 GLUCOSE 134* 117* 101*  BUN 106* 102* 100*  CREATININE 7.58* 7.42* 7.77*  CALCIUM 7.8* 7.7* 7.6*    Liver Function Tests: Recent Labs  Lab 01/23/20 0949  AST 13*  ALT 9  ALKPHOS 70  BILITOT 0.9  PROT 7.2  ALBUMIN 3.0*   No results for input(s): LIPASE, AMYLASE in the last 168 hours. No results for input(s): AMMONIA in the last 168 hours.  CBC: Recent Labs  Lab 01/23/20 0949 01/24/20 0438   WBC 5.6 5.4  NEUTROABS 4.5  --   HGB 9.9* 9.9*  HCT 30.0* 29.8*  MCV 87.5 86.1  PLT 78* 70*    Cardiac Enzymes: No results for input(s): CKTOTAL, CKMB, CKMBINDEX, TROPONINI in the last 168 hours.  BNP: Invalid input(s): POCBNP  CBG: Recent Labs  Lab 01/24/20 0748 01/24/20 1134 01/24/20 1634 01/24/20 2106 01/25/20 0756  GLUCAP 114* 119* 150* 104* 99    Microbiology: Results for orders placed or performed during the hospital encounter of 01/23/20  Urine culture     Status: None (Preliminary result)   Collection Time: 01/23/20  9:40 AM   Specimen: Urine, Random  Result Value Ref Range Status   Specimen Description   Final    URINE, RANDOM Performed at [facility], [address], [place], [place] [address]    Special Requests   Final    NONE Performed at [facility], [address] [place], [place] [address]    Culture   Final    CULTURE REINCUBATED FOR BETTER GROWTH Performed at [facility], [place] [address] [place], [place] [address]    Report Status PENDING  Incomplete  SARS Coronavirus 2 by RT PCR (hospital order, performed in [facility] hospital lab) Nasopharyngeal Nasopharyngeal Swab     Status: None   Collection Time: 01/23/20  4:00 PM   Specimen: Nasopharyngeal Swab  Result Value Ref Range  Status   SARS Coronavirus 2 NEGATIVE NEGATIVE Final    Comment: (NOTE) SARS-CoV-2 target nucleic acids are NOT DETECTED.  The SARS-CoV-2 RNA is generally detectable in upper and lower respiratory specimens during the acute phase of infection. The lowest concentration of SARS-CoV-2 viral copies this assay can detect is 250 copies / mL. A negative result does not preclude SARS-CoV-2 infection and should not be used as the sole basis for treatment or other patient management decisions.  A negative result may occur with improper specimen collection / handling, submission of specimen other than nasopharyngeal swab, presence of viral  mutation(s) within the areas targeted by this assay, and inadequate number of viral copies (<250 copies / mL). A negative result must be combined with clinical observations, patient history, and epidemiological information.  Fact Sheet for Patients:   StrictlyIdeas.no  Fact Sheet for Healthcare Providers: BankingDealers.co.za  This test is not yet approved or  cleared by the [place] FDA and has been authorized for detection and/or diagnosis of SARS-CoV-2 by FDA under an Emergency Use Authorization (EUA).  This EUA will remain in effect (meaning this test can be used) for the duration of the COVID-19 declaration under Section 564(b)(1) of the Act, 21 U.S.C. section 360bbb-3(b)(1), unless the authorization is terminated or revoked sooner.  Performed at [facility], [address] [place], [place] [address]   CULTURE, BLOOD (ROUTINE X 2) w Reflex to ID Panel     Status: None (Preliminary result)   Collection Time: 01/23/20  5:13 PM   Specimen: BLOOD  Result Value Ref Range Status   Specimen Description BLOOD BLOOD LEFT HAND  Final   Special Requests   Final    BOTTLES DRAWN AEROBIC AND ANAEROBIC Blood Culture adequate volume   Culture   Final    NO GROWTH 2 DAYS Performed at [facility], [address] [place], [place] [address]    Report Status PENDING  Incomplete  CULTURE, BLOOD (ROUTINE X 2) w Reflex to ID Panel     Status: None (Preliminary result)   Collection Time: 01/23/20  6:12 PM   Specimen: BLOOD  Result Value Ref Range Status   Specimen Description BLOOD BLOOD LEFT WRIST  Final   Special Requests Blood Culture adequate volume  Final   Culture   Final    NO GROWTH 2 DAYS Performed at [facility], [address], [place], [place] [address]    Report Status PENDING  Incomplete    Coagulation Studies: No results for input(s): LABPROT, INR in the last 72  hours.  Urinalysis: Recent Labs    01/23/20 0940  COLORURINE AMBER*  LABSPEC 1.013  PHURINE 5.0  GLUCOSEU NEGATIVE  HGBUR LARGE*  BILIRUBINUR NEGATIVE  KETONESUR NEGATIVE  PROTEINUR 100*  NITRITE NEGATIVE  LEUKOCYTESUR LARGE*      Imaging: US Renal  Result Date: 01/23/2020 CLINICAL DATA:  Renal failure. History of bladder cancer. History of prostate cancer. EXAM: RENAL / URINARY TRACT ULTRASOUND COMPLETE COMPARISON:  CT scan 11/03/2019 FINDINGS: Right Kidney: Renal measurements: 8.3 x 4.0 x 4.0 cm = volume: 69.53 mL. Increased echogenicity and renal cortical scarring type changes. A right nephrostomy tube is noted. No hydronephrosis. No worrisome renal lesions are identified. There is a small cyst noted in the midpole region. Left Kidney: Renal measurements: 11.0 x 5.8 x 5.6 cm = volume: 185.88 mL. Slight increased echogenicity but normal renal cortical thickness. A nephrostomy tube is noted. No hydronephrosis. No worrisome renal lesions. Bladder: Slightly thick-walled  bladder with echogenic material which could represent hematoma or mass. Other: Small bilateral pleural effusions are noted along with trace ascites. IMPRESSION: 1. Small right kidney and normal size left kidney. 2. Bilateral nephrostomy tubes.  No hydronephrosis. 3. Indeterminate echogenic material in the bladder could be hematoma or mass. 4. Small bilateral pleural effusions. Electronically Signed   By: Marijo Sanes M.D.   On: 01/23/2020 12:49   DG Chest Port 1 View  Result Date: 01/23/2020 CLINICAL DATA:  Shortness of breath EXAM: PORTABLE CHEST 1 VIEW COMPARISON:  September 24, 2019 FINDINGS: There is stable cardiomegaly. Aortic knob calcifications. Hazy interstitial opacities are seen throughout both lungs right greater than left. There is a moderate right effusion present. Hazy airspace opacity at the right lung base, likely layering effusions/atelectasis. A left-sided pacemaker again noted. No acute osseous abnormality.  IMPRESSION: Findings suggestive of interstitial edema. Moderate right pleural effusion with adjacent hazy airspace opacity, likely layering effusions/atelectasis. Electronically Signed   By: Prudencio Pair M.D.   On: 01/23/2020 15:45     Medications:   . sodium chloride 50 mL/hr at 01/25/20 0414  . aztreonam 0.5 g (01/25/20 0527)   . ferrous sulfate  325 mg Oral Q breakfast   And  . vitamin C  250 mg Oral Q breakfast  . azelastine  1 spray Each Nare BID  . feeding supplement (NEPRO CARB STEADY)  237 mL Oral BID BM  . finasteride  5 mg Oral Daily  . insulin aspart  0-5 Units Subcutaneous QHS  . insulin aspart  0-9 Units Subcutaneous TID WC  . metoprolol tartrate  37.5 mg Oral BID  . multivitamin-lutein  1 capsule Oral BID  . pantoprazole  40 mg Oral Daily  . rosuvastatin  5 mg Oral QHS  . sodium bicarbonate  650 mg Oral BID  . tamsulosin  0.4 mg Oral Daily   acetaminophen, albuterol, ondansetron (ZOFRAN) IV  Assessment/ Plan:  86 y.o. male with past medical history of hypertension, hyperlipidemia, diabetes mellitus type 2, GERD, obstructive sleep apnea, urothelial carcinoma of the bladder status post bilateral nephrostomy tube placement, permanent pacemaker placement secondary to sick sinus syndrome, SVT, chronic kidney disease stage IV, thrombocytopenia, chronic respiratory failure who presented with decreased urine output.  1.  Acute kidney injury/chronic kidney disease stage IV/urothelial carcinoma status post bilateral nephrostomy tube placement.  The most recent creatinine we have prior to July was on 11/04/2019.  At that point time creatinine was 3.69 with an EGFR of 15.  Renal function has deteriorated significantly this admission with EGFR of 7.  Renal ultrasound was performed to make sure that there was no hydronephrosis and the study was negative for this.  Therefore he either now has acute kidney injury possibly due to dehydration versus progression of his underlying chronic  kidney disease.   -Renal function remains quite low.  BUN 100 with a creatinine of 7.7 and EGFR of 6.  I had a lengthy discussion with the patient's daughter this AM.  It appears that the family is favoring palliative care which we certainly agree with in this situation.  Dialysis would be difficult for the patient as well as the family given his underlying comorbidities and frailty.  Therefore at this time we are deferring any plans for renal placement therapy.  Patient's daughter will meet with Dr. Posey Pronto later this afternoon.  2.  Anemia of chronic kidney disease.  Hemoglobin 9.9 at last check.  Hold off on Epogen as palliative care being considered at  this time.  3.  Complex case with significant coordination of care performed today.   LOS: 2 Francessca Friis 7/16/202111:12 AM

## 2020-01-25 NOTE — Progress Notes (Signed)
Received to [facility] for insertion of a temp cath.  Pt. Is as reported at baseline status per floor nurse.  Report was received by Junie Panning, RN Specials. Pt. does not verbally communicate, see VS in flowsheets.  Pt does not appear in pain.  Dr. Lucky Cowboy here for procedure.

## 2020-01-26 DIAGNOSIS — E785 Hyperlipidemia, unspecified: Secondary | ICD-10-CM | POA: Diagnosis not present

## 2020-01-26 DIAGNOSIS — I159 Secondary hypertension, unspecified: Secondary | ICD-10-CM | POA: Diagnosis not present

## 2020-01-26 DIAGNOSIS — I5032 Chronic diastolic (congestive) heart failure: Secondary | ICD-10-CM | POA: Diagnosis not present

## 2020-01-26 DIAGNOSIS — N179 Acute kidney failure, unspecified: Secondary | ICD-10-CM | POA: Diagnosis not present

## 2020-01-26 LAB — GLUCOSE, CAPILLARY
Glucose-Capillary: 92 mg/dL (ref 70–99)
Glucose-Capillary: 87 mg/dL (ref 70–99)
Glucose-Capillary: 110 mg/dL — ABNORMAL HIGH (ref 70–99)
Glucose-Capillary: 109 mg/dL — ABNORMAL HIGH (ref 70–99)

## 2020-01-26 LAB — URINE CULTURE: Culture: 70000 — AB

## 2020-01-26 LAB — HEPATITIS B SURFACE ANTIBODY,QUALITATIVE: Hep B S Ab: NONREACTIVE

## 2020-01-26 LAB — PHOSPHORUS: Phosphorus: 8.6 mg/dL — ABNORMAL HIGH (ref 2.5–4.6)

## 2020-01-26 LAB — HEPATITIS B CORE ANTIBODY, TOTAL: Hep B Core Total Ab: NONREACTIVE

## 2020-01-26 LAB — HEPATITIS B SURFACE ANTIGEN: Hepatitis B Surface Ag: NONREACTIVE

## 2020-01-26 MED ORDER — PENTAFLUOROPROP-TETRAFLUOROETH EX AERO
1.0000 "application " | INHALATION_SPRAY | CUTANEOUS | Status: DC | PRN
  Filled 2020-01-26: qty 30

## 2020-01-26 MED ORDER — ALTEPLASE 2 MG IJ SOLR: 2.0000 mg | Freq: Once | INTRAMUSCULAR | Status: DC | PRN

## 2020-01-26 MED ORDER — SODIUM CHLORIDE 0.9 % IV SOLN: 100.0000 mL | INTRAVENOUS | Status: DC | PRN

## 2020-01-26 MED ORDER — LIDOCAINE-PRILOCAINE 2.5-2.5 % EX CREA
1.0000 "application " | TOPICAL_CREAM | CUTANEOUS | Status: DC | PRN
  Filled 2020-01-26: qty 5

## 2020-01-26 MED ORDER — LIDOCAINE HCL (PF) 1 % IJ SOLN
5.0000 mL | INTRAMUSCULAR | Status: DC | PRN
  Filled 2020-01-26: qty 5

## 2020-01-26 MED ORDER — HEPARIN SODIUM (PORCINE) 1000 UNIT/ML DIALYSIS: 1000.0000 [IU] | INTRAMUSCULAR | Status: DC | PRN

## 2020-01-26 NOTE — Progress Notes (Signed)
   01/26/20 1406  Assess: MEWS Score  BP (!) 140/103  Pulse Rate (!) 105  SpO2 100 %  O2 Device Nasal Cannula  Patient Activity (if Appropriate) In bed  O2 Flow Rate (L/min) 2 L/min  Assess: MEWS Score  MEWS Temp 0  MEWS Systolic 0  MEWS Pulse 1  MEWS RR 1  MEWS LOC 0  MEWS Score 2  MEWS Score Color Yellow  Assess: if the MEWS score is Yellow or Red  Were vital signs taken at a resting state? Yes  Focused Assessment Documented focused assessment  Early Detection of Sepsis Score *See Row Information* Medium  MEWS guidelines implemented *See Row Information* No, previously yellow, continue vital signs every 4 hours (charted in error )  Treat  MEWS Interventions Administered scheduled meds/treatments  Take Vital Signs  Increase Vital Sign Frequency   (charted in error )  Escalate  MEWS: Escalate Yellow: discuss with charge nurse/RN and consider discussing with provider and RRT  Notify: Charge Nurse/RN  Name of Charge Nurse/RN Notified A. Quentin Cornwall RN   Date Charge Nurse/RN Notified 01/26/20  Time Charge Nurse/RN Notified 1430

## 2020-01-26 NOTE — Progress Notes (Signed)
[place] [facility] [place] NAME: Blake [name]    MR#:  [id number]  DATE OF BIRTH:  Jul 11, 1923  SUBJECTIVE:  Seen at HD Per RN poor PO intake.  Not much interacting this morning.    REVIEW OF SYSTEMS:   Review of Systems  Unable to perform ROS: Medical condition   Tolerating Diet:very little Tolerating PT: pending--unable to do due to groin HD  DRUG ALLERGIES:   Allergies  Allergen Reactions  . Cefepime     seizure  . Diovan [Valsartan] Cough  . Gabapentin Other (See Comments)    Sedation at all doses  . Hydralazine Itching  . Lisinopril Cough  . Pregabalin Other (See Comments)    Sedation at all doses  . Levofloxacin Other (See Comments)    Too many side effects. Nausea, vomiting, upset stomach, increased confusion, etc Other reaction(s): Confusion Nausea, chills Nausea, chills   . Sulfamethoxazole-Trimethoprim Other (See Comments)    Too many side effects. Nausea, vomiting, upset stomach, increased confusion, etc    VITALS:  Blood pressure (!) 140/98, pulse 100, temperature 97.8 F (36.6 C), temperature source Oral, resp. rate (!) 24, height 6' (1.829 m), weight 90.1 kg, SpO2 96 %.  PHYSICAL EXAMINATION:   Physical Exam limited exam due to patient participation and decline in condition GENERAL:  86 y.o.-year-old patient lying in the bed with no acute distress. Chronically ill and debilitated LUNGS: coarse breath sounds bilaterally, no wheezing, rales, rhonchi. No use of accessory muscles of respiration.  CARDIOVASCULAR: S1, S2 normal. No murmurs, rubs, or gallops.  ABDOMEN: Soft, nontender, nondistended. Bowel sounds present. No organomegaly or mass. Chronic Bilateral nephrostomy tubes+ EXTREMITIES: No cyanosis, clubbing , + edema b/l.   Right groin catheter+ NEUROLOGIC: unable to assess. Patient quite weak and lethargic  PSYCHIATRIC:  patient is lethargic SKIN: Pressure Injury 11/21/18 Buttocks Bilateral;Left;Right Stage  II -  Partial thickness loss of dermis presenting as a shallow open ulcer with a red, pink wound bed without slough. pink (Active)  11/21/18 2350  Location: Buttocks  Location Orientation: Bilateral;Left;Right  Staging: Stage II -  Partial thickness loss of dermis presenting as a shallow open ulcer with a red, pink wound bed without slough.  Wound Description (Comments): pink  Present on Admission: Yes     Pressure Injury 01/01/19 Buttocks Left Stage I -  Intact skin with non-blanchable redness of a localized area usually over a bony prominence. (Active)  01/01/19 0805  Location: Buttocks  Location Orientation: Left  Staging: Stage I -  Intact skin with non-blanchable redness of a localized area usually over a bony prominence.  Wound Description (Comments):   Present on Admission: Yes     Pressure Injury 09/25/19 Sacrum Stage 3 -  Full thickness tissue loss. Subcutaneous fat may be visible but bone, tendon or muscle are NOT exposed. 1cm x 1cm . Cleansed and Foam applied (Active)  09/25/19 0200  Location: Sacrum  Location Orientation:   Staging: Stage 3 -  Full thickness tissue loss. Subcutaneous fat may be visible but bone, tendon or muscle are NOT exposed.  Wound Description (Comments): 1cm x 1cm . Cleansed and Foam applied  Present on Admission: Yes     Pressure Injury 09/25/19 Heel Right Deep Tissue Pressure Injury - Purple or maroon localized area of discolored intact skin or blood-filled blister due to damage of underlying soft tissue from pressure and/or shear. Right heel is purple. Skin intact. Ele (Active)  09/25/19 0200  Location:  Heel  Location Orientation: Right  Staging: Deep Tissue Pressure Injury - Purple or maroon localized area of discolored intact skin or blood-filled blister due to damage of underlying soft tissue from pressure and/or shear.  Wound Description (Comments): Right heel is purple. Skin intact. Elevated on 2 pillows.  Present on Admission: Yes      Pressure Injury 01/23/20 Sacrum Medial Stage 2 -  Partial thickness loss of dermis presenting as a shallow open injury with a red, pink wound bed without slough. st 2 to right cheek (Active)  01/23/20 1647  Location: Sacrum  Location Orientation: Medial  Staging: Stage 2 -  Partial thickness loss of dermis presenting as a shallow open injury with a red, pink wound bed without slough.  Wound Description (Comments): st 2 to right cheek  Present on Admission: Yes      LABORATORY PANEL:  CBC Recent Labs  Lab 01/24/20 0438  WBC 5.4  HGB 9.9*  HCT 29.8*  PLT 70*    Chemistries  Recent Labs  Lab 01/23/20 0949 01/24/20 0438 01/25/20 0457  NA 132*   < > 132*  K 3.8   < > 4.1  CL 85*   < > 89*  CO2 27   < > 27  GLUCOSE 134*   < > 101*  BUN 106*   < > 100*  CREATININE 7.58*   < > 7.77*  CALCIUM 7.8*   < > 7.6*  AST 13*  --   --   ALT 9  --   --   ALKPHOS 70  --   --   BILITOT 0.9  --   --    < > = values in this interval not displayed.   Cardiac Enzymes No results for input(s): TROPONINI in the last 168 hours. RADIOLOGY:  No results found. ASSESSMENT AND PLAN:   Blake [name] is a 86 y.o. male with medical history significant of hypertension, hyperlipidemia, diabetes mellitus, GERD, OSA, prostate cancer, urothelial carcinoma of the bladder, s/p of bilateral nephrostomy, pacemaker placement due to SSS, GI bleeding, dCHF, SVT, CKD-IV, thrombocytopenia, chronic respiratory failure on 2 L nasal cannula oxygen at night at home, who presents with decreased urine output.  Acute renal failure superimposed on stage 4 chronic kidney disease ([place]): Cre is up from 3.69 on 11/04/19 -->7.58, BUN 106. --upto 7.77 - Renal ultrasound showed no hydronephrosis. -Nephrology consultation with Dr. Arthor Captain-- appears renal function has deteriorated causing uremia/encephalopathy/poor urine output/overall decline -received IV fluids -hold torsemide -Avoid using renal toxic  medications -7/16> Dter and pt wish to start HD. Right groin HD access by Dr Lucky Cowboy -7/17> Started HD--1st treatment  Abnormal Urine--appears colonization with Chronic bilateral nephrostomy tubes -Was on IV aztreonam--will d/c it. No fever, wbc normal -blood culture negative - urine culture 70K Staph epidermidis  SVT (supraventricular tachycardia) (HCC) -Metoprolol  HLD (hyperlipidemia) -Crestor  Urothelial carcinoma of bladder and hx of prostate cancer -s/p chronic bilateral nephrostomy for ~1year -Proscar and Flomax  Acute respiratory failure with hypoxia: Likely due to fluid overload.  Patient has 2+ leg edema -As needed albuterol -Nasal cannula oxygen to maintain oxygen saturation above 93% -now on HD--hopefully some UF will be attempted  Chronic diastolic CHF (congestive heart failure) ([place]): 2D echo on 11/22/2018 showed EF 55-60%.  Patient has elevated BNP 2109, 2+ leg edema, indicating fluid overload.  Since patient has worsening renal function, will hold diuretics -Hold torsemide -Nasal cannula oxygen to maintain oxygen saturation above 93%  HTN (hypertension) -As  needed hydralazine -Metoprolol  Type II diabetes mellitus with renal manifestations [facility]): Recent A1c 7.5, patient is taking Januvia -SSI  Normocytic anemia due to Chronic disease - Hemoglobin 9.9 which was a 10.9 on 4/20/4/21.  No active bleeding tendency.  Chronic decubitus pressure ulcers present on admission -follow-up wound care   DVT ppx: SCD Code Status: Full code per dter Dr crisp on admission Family Communication:  Dr Raliegh Ip to call today. I spoke with dter on 7/16 in the room Disposition Plan:  Anticipate discharge back to previous environment  Consults called:  Nephrology Admission status: Med-surg bed as inpt     Status is: Inpatient  Dispo: The patient is from: Home  Anticipated d/c is to: Home  Anticipated d/c date is:few  Patient currently  is not medically stable to d/c. worsening kidney disease.  Pt now started on HD--TOC to arrange for outpt HD He will likely need tunneled catheter prior to d/c  TOTAL TIME TAKING CARE OF THIS PATIENT: *25* minutes.  >50% time spent on counselling and coordination of care  Note: This dictation was prepared with Dragon dictation along with smaller phrase technology. Any transcriptional errors that result from this process are unintentional.  Fritzi Mandes M.D    [facility]   CC: Primary care physician; Adin Hector, MDPatient ID: Blake [name], male   DOB: Nov 01, 1922, 86 y.o.   MRN: [id number]

## 2020-01-26 NOTE — Progress Notes (Addendum)
[facility]  ROUNDING NOTE   Subjective:   Patient was seen on hemodialysis.  Had a temp femoral HD catheter placed last night.  Patient will not answer any questions, just asking about his son.  230 mL of urine output yesterday   Objective:  Vital signs in last 24 hours:  Temp:  [97.4 F (36.3 C)-98.5 F (36.9 C)] 98.5 F (36.9 C) (07/17 0915) Pulse Rate:  [76-119] 107 (07/17 0932) Resp:  [14-26] 23 (07/17 0932) BP: (123-142)/(86-116) 128/100 (07/17 0932) SpO2:  [83 %-100 %] 100 % (07/17 0915) Weight:  [90.1 kg] 90.1 kg (07/17 0840)  Weight change:  Filed Weights   01/23/20 0956 01/25/20 0500 01/26/20 0840  Weight: 81.6 kg 96.9 kg 90.1 kg    Intake/Output: I/O last 3 completed shifts: In: 1646.3 [P.O.:220; I.V.:1076.3; Other:200; IV Piggyback:150] Out: 330 [Urine:330]   Intake/Output this shift:  Total I/O In: 169.9 [I.V.:119.9; IV Piggyback:50] Out: 0   Physical Exam: General: Agitated  Head: Normocephalic, atraumatic. Moist oral mucosal membranes  Eyes: Anicteric, PERRL  Neck: Supple, trachea midline  Lungs:  + rales and rhonchi.   Heart: Tachycardic   Abdomen:  Soft, nontender,   Extremities:  1+ -2+peripheral edema.  Neurologic: Nonfocal, moving all four extremities  Skin: No lesions visible, limited exam   Access: Right femoral temporary HD catheter.     Basic Metabolic Panel: Recent Labs  Lab 01/23/20 0949 01/24/20 0438 01/25/20 0457 01/26/20 0807  NA 132* 132* 132*  --   K 3.8 3.8 4.1  --   CL 85* 89* 89*  --   CO2 '27 27 27  '$ --   GLUCOSE 134* 117* 101*  --   BUN 106* 102* 100*  --   CREATININE 7.58* 7.42* 7.77*  --   CALCIUM 7.8* 7.7* 7.6*  --   PHOS  --   --   --  8.6*    Liver Function Tests: Recent Labs  Lab 01/23/20 0949  AST 13*  ALT 9  ALKPHOS 70  BILITOT 0.9  PROT 7.2  ALBUMIN 3.0*   No results for input(s): LIPASE, AMYLASE in the last 168 hours. No results for input(s): AMMONIA in the last 168  hours.  CBC: Recent Labs  Lab 01/23/20 0949 01/24/20 0438  WBC 5.6 5.4  NEUTROABS 4.5  --   HGB 9.9* 9.9*  HCT 30.0* 29.8*  MCV 87.5 86.1  PLT 78* 70*    Cardiac Enzymes: No results for input(s): CKTOTAL, CKMB, CKMBINDEX, TROPONINI in the last 168 hours.  BNP: Invalid input(s): POCBNP  CBG: Recent Labs  Lab 01/25/20 0756 01/25/20 1133 01/25/20 1621 01/25/20 2126 01/26/20 0815  GLUCAP 99 83 89 133* 92    Microbiology: Results for orders placed or performed during the hospital encounter of 01/23/20  Urine culture     Status: Abnormal   Collection Time: 01/23/20  9:40 AM   Specimen: Urine, Random  Result Value Ref Range Status   Specimen Description   Final    URINE, RANDOM Performed at [facility], [address] [place], [place] [address]    Special Requests   Final    NONE Performed at [facility], [place], [place] [address]    Culture 70,000 COLONIES/mL STAPHYLOCOCCUS EPIDERMIDIS (A)  Final   Report Status 01/26/2020 FINAL  Final   Organism ID, Bacteria STAPHYLOCOCCUS EPIDERMIDIS (A)  Final      Susceptibility   Staphylococcus epidermidis - MIC*    CIPROFLOXACIN >=8  RESISTANT Resistant     GENTAMICIN <=0.5 SENSITIVE Sensitive     NITROFURANTOIN <=16 SENSITIVE Sensitive     OXACILLIN >=4 RESISTANT Resistant     TETRACYCLINE 2 SENSITIVE Sensitive     VANCOMYCIN 1 SENSITIVE Sensitive     TRIMETH/SULFA 80 RESISTANT Resistant     CLINDAMYCIN <=0.25 SENSITIVE Sensitive     RIFAMPIN <=0.5 SENSITIVE Sensitive     Inducible Clindamycin NEGATIVE Sensitive     * 70,000 COLONIES/mL STAPHYLOCOCCUS EPIDERMIDIS  SARS Coronavirus 2 by RT PCR (hospital order, performed in [place] hospital lab) Nasopharyngeal Nasopharyngeal Swab     Status: None   Collection Time: 01/23/20  4:00 PM   Specimen: Nasopharyngeal Swab  Result Value Ref Range Status   SARS Coronavirus 2 NEGATIVE NEGATIVE Final    Comment: (NOTE) SARS-CoV-2 target  nucleic acids are NOT DETECTED.  The SARS-CoV-2 RNA is generally detectable in upper and lower respiratory specimens during the acute phase of infection. The lowest concentration of SARS-CoV-2 viral copies this assay can detect is 250 copies / mL. A negative result does not preclude SARS-CoV-2 infection and should not be used as the sole basis for treatment or other patient management decisions.  A negative result may occur with improper specimen collection / handling, submission of specimen other than nasopharyngeal swab, presence of viral mutation(s) within the areas targeted by this assay, and inadequate number of viral copies (<250 copies / mL). A negative result must be combined with clinical observations, patient history, and epidemiological information.  Fact Sheet for Patients:   StrictlyIdeas.no  Fact Sheet for Healthcare Providers: BankingDealers.co.za  This test is not yet approved or  cleared by the [place] FDA and has been authorized for detection and/or diagnosis of SARS-CoV-2 by FDA under an Emergency Use Authorization (EUA).  This EUA will remain in effect (meaning this test can be used) for the duration of the COVID-19 declaration under Section 564(b)(1) of the Act, 21 U.S.C. section 360bbb-3(b)(1), unless the authorization is terminated or revoked sooner.  Performed at [facility], [address] [place], [place] [address]   CULTURE, BLOOD (ROUTINE X 2) w Reflex to ID Panel     Status: None (Preliminary result)   Collection Time: 01/23/20  5:13 PM   Specimen: BLOOD  Result Value Ref Range Status   Specimen Description BLOOD BLOOD LEFT HAND  Final   Special Requests   Final    BOTTLES DRAWN AEROBIC AND ANAEROBIC Blood Culture adequate volume   Culture   Final    NO GROWTH 3 DAYS Performed at [facility], [address], [place],  [address]    Report Status PENDING  Incomplete   CULTURE, BLOOD (ROUTINE X 2) w Reflex to ID Panel     Status: None (Preliminary result)   Collection Time: 01/23/20  6:12 PM   Specimen: BLOOD  Result Value Ref Range Status   Specimen Description BLOOD BLOOD LEFT WRIST  Final   Special Requests Blood Culture adequate volume  Final   Culture   Final    NO GROWTH 3 DAYS Performed at [facility], [address], [place],  [address]    Report Status PENDING  Incomplete  MRSA PCR Screening     Status: Abnormal   Collection Time: 01/25/20  8:17 AM   Specimen: Nasopharyngeal  Result Value Ref Range Status   MRSA by PCR POSITIVE (A) NEGATIVE Final    Comment:        The GeneXpert  MRSA Assay (FDA approved for NASAL specimens only), is one component of a comprehensive MRSA colonization surveillance program. It is not intended to diagnose MRSA infection nor to guide or monitor treatment for MRSA infections. RESULT CALLED TO, READ BACK BY AND VERIFIED WITH: MARIA [facility] 01/25/20 1945 SJL Performed at [facility], [address] [place], [place] [address]     Coagulation Studies: No results for input(s): LABPROT, INR in the last 72 hours.  Urinalysis: No results for input(s): COLORURINE, LABSPEC, PHURINE, GLUCOSEU, HGBUR, BILIRUBINUR, KETONESUR, PROTEINUR, UROBILINOGEN, NITRITE, LEUKOCYTESUR in the last 72 hours.  Invalid input(s): APPERANCEUR    Imaging: No results found.   Medications:    sodium chloride 50 mL/hr at 01/26/20 0800   sodium chloride     sodium chloride     aztreonam Stopped (01/26/20 0536)    ferrous sulfate  325 mg Oral Q breakfast   And   vitamin C  250 mg Oral Q breakfast   azelastine  1 spray Each Nare BID   Chlorhexidine Gluconate Cloth  6 each Topical Daily   Chlorhexidine Gluconate Cloth  6 each Topical Q0600   Chlorhexidine Gluconate Cloth  6 each Topical Q0600   feeding supplement (NEPRO CARB STEADY)  237 mL Oral BID BM   finasteride  5 mg Oral Daily    insulin aspart  0-5 Units Subcutaneous QHS   insulin aspart  0-9 Units Subcutaneous TID WC   metoprolol tartrate  37.5 mg Oral BID   multivitamin-lutein  1 capsule Oral BID   mupirocin ointment  1 application Nasal BID   pantoprazole  40 mg Oral Daily   rosuvastatin  5 mg Oral QHS   sodium bicarbonate  650 mg Oral BID   tamsulosin  0.4 mg Oral Daily   sodium chloride, sodium chloride, acetaminophen, albuterol, alteplase, heparin, lidocaine (PF), lidocaine-prilocaine, ondansetron (ZOFRAN) IV, pentafluoroprop-tetrafluoroeth  Assessment/ Plan:  Blake [name] is a 86 y.o.  male  with past medical history of hypertension, hyperlipidemia, diabetes mellitus type 2, GERD, obstructive sleep apnea, urothelial carcinoma of the bladder status post bilateral nephrostomy tube placement, permanent pacemaker placement secondary to sick sinus syndrome, SVT, chronic kidney disease stage IV, thrombocytopenia, chronic respiratory failure who presented with decreased urine output.   1.  Acute kidney injury/chronic kidney disease stage IV/urothelial carcinoma status post bilateral nephrostomy tube placement.  The most recent creatinine we have prior to July was on 11/04/2019.  At that point time creatinine was 3.69 with an EGFR of 15.  Renal function has deteriorated significantly this admission with EGFR of 6.  Renal ultrasound was negative for hydronephrosis.  Therefore he either now has acute kidney injury possibly due to dehydration versus progression of his underlying chronic kidney disease.   -Renal function remains quite low.  BUN 100 with a creatinine of 7.7 and EGFR of 6.  - Patients family has made the decision to pursue dialysis. Palliative care is an appropriate alternative if patient does not tolerate dialysis well.  - patient was seen on dialysis. Has become agitated, tachycardic.    2.  Anemia of chronic kidney disease.  Hemoglobin 9.9 at last check. No epogen at this time.     3.  Hypertension-  has not been given any medication due to dialysis this morning.     LOS: [place] 7/17/202110:19 AM  Patient seen and examined with physician assistant. Patient seen and examined on hemodialysis treatment. Patient with irregular rhythm on examination. Patient is confused  and cannot give much of a history. Unclear how patient will tolerate further dialysis treatments. Plan on second dialysis treatment for Monday.   Lavonia Dana, [facility]  7/17/202111:57 AM

## 2020-01-27 DIAGNOSIS — I159 Secondary hypertension, unspecified: Secondary | ICD-10-CM | POA: Diagnosis not present

## 2020-01-27 DIAGNOSIS — I5032 Chronic diastolic (congestive) heart failure: Secondary | ICD-10-CM | POA: Diagnosis not present

## 2020-01-27 DIAGNOSIS — N179 Acute kidney failure, unspecified: Secondary | ICD-10-CM | POA: Diagnosis not present

## 2020-01-27 DIAGNOSIS — E785 Hyperlipidemia, unspecified: Secondary | ICD-10-CM | POA: Diagnosis not present

## 2020-01-27 LAB — CBC
HCT: 28.3 % — ABNORMAL LOW (ref 39.0–52.0)
Hemoglobin: 9.2 g/dL — ABNORMAL LOW (ref 13.0–17.0)
MCH: 28.6 pg (ref 26.0–34.0)
MCHC: 32.5 g/dL (ref 30.0–36.0)
MCV: 87.9 fL (ref 80.0–100.0)
Platelets: 60 10*3/uL — ABNORMAL LOW (ref 150–400)
RBC: 3.22 MIL/uL — ABNORMAL LOW (ref 4.22–5.81)
RDW: 16.9 % — ABNORMAL HIGH (ref 11.5–15.5)
WBC: 4.6 10*3/uL (ref 4.0–10.5)
nRBC: 0 % (ref 0.0–0.2)

## 2020-01-27 LAB — GLUCOSE, CAPILLARY
Glucose-Capillary: 85 mg/dL (ref 70–99)
Glucose-Capillary: 80 mg/dL (ref 70–99)
Glucose-Capillary: 90 mg/dL (ref 70–99)
Glucose-Capillary: 94 mg/dL (ref 70–99)

## 2020-01-27 LAB — PARATHYROID HORMONE, INTACT (NO CA): PTH: 152 pg/mL — ABNORMAL HIGH (ref 15–65)

## 2020-01-27 LAB — RENAL FUNCTION PANEL
Albumin: 2.7 g/dL — ABNORMAL LOW (ref 3.5–5.0)
Anion gap: 12 (ref 5–15)
BUN: 95 mg/dL — ABNORMAL HIGH (ref 8–23)
CO2: 29 mmol/L (ref 22–32)
Calcium: 7.6 mg/dL — ABNORMAL LOW (ref 8.9–10.3)
Chloride: 95 mmol/L — ABNORMAL LOW (ref 98–111)
Creatinine, Ser: 7.29 mg/dL — ABNORMAL HIGH (ref 0.61–1.24)
GFR calc Af Amer: 7 mL/min — ABNORMAL LOW (ref >60)
GFR calc non Af Amer: 6 mL/min — ABNORMAL LOW (ref >60)
Glucose, Bld: 93 mg/dL (ref 70–99)
Phosphorus: 7.8 mg/dL — ABNORMAL HIGH (ref 2.5–4.6)
Potassium: 4 mmol/L (ref 3.5–5.1)
Sodium: 136 mmol/L (ref 135–145)

## 2020-01-27 MED ORDER — ORAL CARE MOUTH RINSE
15.0000 mL | Freq: Two times a day (BID) | OROMUCOSAL | Status: DC
  Administered 2020-01-27 – 2020-02-10 (×23): 15 mL via OROMUCOSAL

## 2020-01-27 NOTE — Plan of Care (Signed)
Patient very lethargic and too drowsy to take oral medications. Patient also not taking in much food. Administered mouth care and orally suctioned patient.

## 2020-01-27 NOTE — Progress Notes (Addendum)
[facility]  ROUNDING NOTE   Subjective:   Patient unable to give a history.    Objective:  Vital signs in last 24 hours:  Temp:  [97.8 F (36.6 C)-98.2 F (36.8 C)] 97.8 F (36.6 C) (07/18 0327) Pulse Rate:  [79-105] 88 (07/18 0327) Resp:  [20-32] 22 (07/18 0327) BP: (112-140)/(72-103) 126/83 (07/18 0327) SpO2:  [93 %-100 %] 99 % (07/18 0327)  Weight change:  Filed Weights   01/23/20 0956 01/25/20 0500 01/26/20 0840  Weight: 81.6 kg 96.9 kg 90.1 kg    Intake/Output: I/O last 3 completed shifts: In: 2046.3 [P.O.:577; I.V.:1169.3; Other:200; IV Piggyback:100] Out: 375 [Urine:375]   Intake/Output this shift:  No intake/output data recorded.  Physical Exam: General: NAD  Head: Normocephalic, atraumatic. Moist oral mucosal membranes  Eyes: Anicteric, PERRL  Neck: Supple, trachea midline  Lungs:  +rhonchi bilaterally   Heart: Regularly irregular   Abdomen:  Soft, nontender,   Extremities:  2+ peripheral edema.  Neurologic: Nonfocal, moving all four extremities  Skin: No lesions  Access: Left femoral dialysis catheter     Basic Metabolic Panel: Recent Labs  Lab 01/23/20 0949 01/23/20 0949 01/24/20 0438 01/25/20 0457 01/26/20 0807 01/27/20 0451  NA 132*  --  132* 132*  --  136  K 3.8  --  3.8 4.1  --  4.0  CL 85*  --  89* 89*  --  95*  CO2 27  --  27 27  --  29  GLUCOSE 134*  --  117* 101*  --  93  BUN 106*  --  102* 100*  --  95*  CREATININE 7.58*  --  7.42* 7.77*  --  7.29*  CALCIUM 7.8*   < > 7.7* 7.6*  --  7.6*  PHOS  --   --   --   --  8.6* 7.8*   < > = values in this interval not displayed.    Liver Function Tests: Recent Labs  Lab 01/23/20 0949 01/27/20 0451  AST 13*  --   ALT 9  --   ALKPHOS 70  --   BILITOT 0.9  --   PROT 7.2  --   ALBUMIN 3.0* 2.7*   No results for input(s): LIPASE, AMYLASE in the last 168 hours. No results for input(s): AMMONIA in the last 168 hours.  CBC: Recent Labs  Lab 01/23/20 0949  01/24/20 0438 01/27/20 0451  WBC 5.6 5.4 4.6  NEUTROABS 4.5  --   --   HGB 9.9* 9.9* 9.2*  HCT 30.0* 29.8* 28.3*  MCV 87.5 86.1 87.9  PLT 78* 70* 60*    Cardiac Enzymes: No results for input(s): CKTOTAL, CKMB, CKMBINDEX, TROPONINI in the last 168 hours.  BNP: Invalid input(s): POCBNP  CBG: Recent Labs  Lab 01/26/20 0815 01/26/20 1150 01/26/20 1714 01/26/20 2150 01/27/20 0734  GLUCAP 92 87 110* 109* 85    Microbiology: Results for orders placed or performed during the hospital encounter of 01/23/20  Urine culture     Status: Abnormal   Collection Time: 01/23/20  9:40 AM   Specimen: Urine, Random  Result Value Ref Range Status   Specimen Description   Final    URINE, RANDOM Performed at [facility], [address] [place], [place] [address]    Special Requests   Final    NONE Performed at [facility], [address], [place], [place] [address]    Culture 70,000 COLONIES/mL STAPHYLOCOCCUS EPIDERMIDIS (A)  Final  Report Status 01/26/2020 FINAL  Final   Organism ID, Bacteria STAPHYLOCOCCUS EPIDERMIDIS (A)  Final      Susceptibility   Staphylococcus epidermidis - MIC*    CIPROFLOXACIN >=8 RESISTANT Resistant     GENTAMICIN <=0.5 SENSITIVE Sensitive     NITROFURANTOIN <=16 SENSITIVE Sensitive     OXACILLIN >=4 RESISTANT Resistant     TETRACYCLINE 2 SENSITIVE Sensitive     VANCOMYCIN 1 SENSITIVE Sensitive     TRIMETH/SULFA 80 RESISTANT Resistant     CLINDAMYCIN <=0.25 SENSITIVE Sensitive     RIFAMPIN <=0.5 SENSITIVE Sensitive     Inducible Clindamycin NEGATIVE Sensitive     * 70,000 COLONIES/mL STAPHYLOCOCCUS EPIDERMIDIS  SARS Coronavirus 2 by RT PCR (hospital order, performed in [place] hospital lab) Nasopharyngeal Nasopharyngeal Swab     Status: None   Collection Time: 01/23/20  4:00 PM   Specimen: Nasopharyngeal Swab  Result Value Ref Range Status   SARS Coronavirus 2 NEGATIVE NEGATIVE Final    Comment: (NOTE) SARS-CoV-2 target  nucleic acids are NOT DETECTED.  The SARS-CoV-2 RNA is generally detectable in upper and lower respiratory specimens during the acute phase of infection. The lowest concentration of SARS-CoV-2 viral copies this assay can detect is 250 copies / mL. A negative result does not preclude SARS-CoV-2 infection and should not be used as the sole basis for treatment or other patient management decisions.  A negative result may occur with improper specimen collection / handling, submission of specimen other than nasopharyngeal swab, presence of viral mutation(s) within the areas targeted by this assay, and inadequate number of viral copies (<250 copies / mL). A negative result must be combined with clinical observations, patient history, and epidemiological information.  Fact Sheet for Patients:   StrictlyIdeas.no  Fact Sheet for Healthcare Providers: BankingDealers.co.za  This test is not yet approved or  cleared by the [place] FDA and has been authorized for detection and/or diagnosis of SARS-CoV-2 by FDA under an Emergency Use Authorization (EUA).  This EUA will remain in effect (meaning this test can be used) for the duration of the COVID-19 declaration under Section 564(b)(1) of the Act, 21 U.S.C. section 360bbb-3(b)(1), unless the authorization is terminated or revoked sooner.  Performed at [facility], [address] [place], [place] [address]   CULTURE, BLOOD (ROUTINE X 2) w Reflex to ID Panel     Status: None (Preliminary result)   Collection Time: 01/23/20  5:13 PM   Specimen: BLOOD  Result Value Ref Range Status   Specimen Description BLOOD BLOOD LEFT HAND  Final   Special Requests   Final    BOTTLES DRAWN AEROBIC AND ANAEROBIC Blood Culture adequate volume   Culture   Final    NO GROWTH 4 DAYS Performed at [facility], [address], [place], [place] [address]    Report Status PENDING  Incomplete   CULTURE, BLOOD (ROUTINE X 2) w Reflex to ID Panel     Status: None (Preliminary result)   Collection Time: 01/23/20  6:12 PM   Specimen: BLOOD  Result Value Ref Range Status   Specimen Description BLOOD BLOOD LEFT WRIST  Final   Special Requests Blood Culture adequate volume  Final   Culture   Final    NO GROWTH 4 DAYS Performed at [facility], [address] [place],  [address]    Report Status PENDING  Incomplete  MRSA PCR Screening     Status: Abnormal   Collection Time: 01/25/20  8:17  AM   Specimen: Nasopharyngeal  Result Value Ref Range Status   MRSA by PCR POSITIVE (A) NEGATIVE Final    Comment:        The GeneXpert MRSA Assay (FDA approved for NASAL specimens only), is one component of a comprehensive MRSA colonization surveillance program. It is not intended to diagnose MRSA infection nor to guide or monitor treatment for MRSA infections. RESULT CALLED TO, READ BACK BY AND VERIFIED WITH: [facility] 01/25/20 1945 SJL Performed at [facility], [address] [place], [place] [address]     Coagulation Studies: No results for input(s): LABPROT, INR in the last 72 hours.  Urinalysis: No results for input(s): COLORURINE, LABSPEC, PHURINE, GLUCOSEU, HGBUR, BILIRUBINUR, KETONESUR, PROTEINUR, UROBILINOGEN, NITRITE, LEUKOCYTESUR in the last 72 hours.  Invalid input(s): APPERANCEUR    Imaging: No results found.   Medications:   . sodium chloride    . sodium chloride     . ferrous sulfate  325 mg Oral Q breakfast   And  . vitamin C  250 mg Oral Q breakfast  . azelastine  1 spray Each Nare BID  . Chlorhexidine Gluconate Cloth  6 each Topical Daily  . Chlorhexidine Gluconate Cloth  6 each Topical Q0600  . Chlorhexidine Gluconate Cloth  6 each Topical Q0600  . feeding supplement (NEPRO CARB STEADY)  237 mL Oral BID BM  . finasteride  5 mg Oral Daily  . insulin aspart  0-5 Units Subcutaneous QHS  . insulin aspart  0-9 Units  Subcutaneous TID WC  . metoprolol tartrate  37.5 mg Oral BID  . multivitamin-lutein  1 capsule Oral BID  . mupirocin ointment  1 application Nasal BID  . pantoprazole  40 mg Oral Daily  . rosuvastatin  5 mg Oral QHS  . sodium bicarbonate  650 mg Oral BID  . tamsulosin  0.4 mg Oral Daily   sodium chloride, sodium chloride, acetaminophen, albuterol, alteplase, heparin, lidocaine (PF), lidocaine-prilocaine, ondansetron (ZOFRAN) IV, pentafluoroprop-tetrafluoroeth  Assessment/ Plan:  [name] is a 86 y.o.  male with past medical history of hypertension, hyperlipidemia, diabetes mellitus type 2, GERD, obstructive sleep apnea, urothelial carcinoma of the bladder status post bilateral nephrostomy tube placement, permanent pacemaker placement secondary to sick sinus syndrome, SVT, chronic kidney disease stage IV, thrombocytopenia, chronic respiratory failure who presented with decreased urine output.  1. Acute kidney injury/chronic kidney disease stage IV/urothelial carcinoma status post bilateral nephrostomy tube placement. The most recent creatinine we have prior to July was on 11/04/2019. At that point time creatinine was 3.69 with an EGFR of 15. Renal function has deteriorated significantly this admission with EGFR of 6. Renal ultrasound was negative for hydronephrosis. Therefore he either now has acute kidney injury possibly due to dehydration versus progression of his underlying chronic kidney disease.  -Renal function remains quite low. BUN 95 with a creatinine of 7.29 and EGFR of 7. Urine output yesterday 195 mL, No UF yesterday with dialysis.  - Patients family has made the decision to pursue dialysis.Dr. Juleen China spoke with the daughter yesterday and they are wanting to continue a trial of dialysis.  - Will plan dialysis for tomorrow.   2. Anemia of chronic kidney disease. Hemoglobin 9.2  No epogen at this time.    3. Hypertension-   on metoprolol last given on  Wednesday.  BP controlled today 126/83    LOS: [place] 7/18/202110:06 AM   Patient seen and examined with physician assistant. Agree with above plan.  Discussed with daughter yesterday and son today. Patient to continue trial run of dialysis. Second treatment for tomorrow.   Lavonia Dana, [facility]  7/18/202112:08 PM

## 2020-01-27 NOTE — Progress Notes (Signed)
[place] [facility] [place] NAME: Blake [name]    Blake#:  [id number]  DATE OF BIRTH:  Nov 30, 1922  SUBJECTIVE:   resting quietly Son Blake [name] in the room   REVIEW OF SYSTEMS:   Review of Systems  Unable to perform ROS: Medical condition   Tolerating Diet:very little Tolerating PT: pending--unable to do due to groin HD  DRUG ALLERGIES:   Allergies  Allergen Reactions  . Cefepime     seizure  . Diovan [Valsartan] Cough  . Gabapentin Other (See Comments)    Sedation at all doses  . Hydralazine Itching  . Lisinopril Cough  . Pregabalin Other (See Comments)    Sedation at all doses  . Levofloxacin Other (See Comments)    Too many side effects. Nausea, vomiting, upset stomach, increased confusion, etc Other reaction(s): Confusion Nausea, chills Nausea, chills   . Sulfamethoxazole-Trimethoprim Other (See Comments)    Too many side effects. Nausea, vomiting, upset stomach, increased confusion, etc    VITALS:  Blood pressure 126/83, pulse 88, temperature 97.8 F (36.6 C), temperature source Oral, resp. rate (!) 22, height 6' (1.829 m), weight 90.1 kg, SpO2 99 %.  PHYSICAL EXAMINATION:   Physical Exam limited exam due to patient participation and decline in condition GENERAL:  86 y.o.-year-old patient lying in the bed with no acute distress. Chronically ill and debilitated LUNGS: coarse breath sounds bilaterally, no wheezing, rales, rhonchi. No use of accessory muscles of respiration.  CARDIOVASCULAR: S1, S2 normal. No murmurs, rubs, or gallops.  ABDOMEN: Soft, nontender, nondistended. Bowel sounds present. No organomegaly or mass. Chronic Bilateral nephrostomy tubes+ EXTREMITIES: No cyanosis, clubbing , + edema b/l.   Right groin catheter+ placed 7/16 NEUROLOGIC: unable to assess. Patient quite weak and lethargic  PSYCHIATRIC:  patient is lethargic SKIN: Pressure Injury 11/21/18 Buttocks Bilateral;Left;Right Stage II -  Partial  thickness loss of dermis presenting as a shallow open ulcer with a red, pink wound bed without slough. pink (Active)  11/21/18 2350  Location: Buttocks  Location Orientation: Bilateral;Left;Right  Staging: Stage II -  Partial thickness loss of dermis presenting as a shallow open ulcer with a red, pink wound bed without slough.  Wound Description (Comments): pink  Present on Admission: Yes     Pressure Injury 01/01/19 Buttocks Left Stage I -  Intact skin with non-blanchable redness of a localized area usually over a bony prominence. (Active)  01/01/19 0805  Location: Buttocks  Location Orientation: Left  Staging: Stage I -  Intact skin with non-blanchable redness of a localized area usually over a bony prominence.  Wound Description (Comments):   Present on Admission: Yes     Pressure Injury 09/25/19 Sacrum Stage 3 -  Full thickness tissue loss. Subcutaneous fat may be visible but bone, tendon or muscle are NOT exposed. 1cm x 1cm . Cleansed and Foam applied (Active)  09/25/19 0200  Location: Sacrum  Location Orientation:   Staging: Stage 3 -  Full thickness tissue loss. Subcutaneous fat may be visible but bone, tendon or muscle are NOT exposed.  Wound Description (Comments): 1cm x 1cm . Cleansed and Foam applied  Present on Admission: Yes     Pressure Injury 09/25/19 Heel Right Deep Tissue Pressure Injury - Purple or maroon localized area of discolored intact skin or blood-filled blister due to damage of underlying soft tissue from pressure and/or shear. Right heel is purple. Skin intact. Ele (Active)  09/25/19 0200  Location: Heel  Location Orientation: Right  Staging: Deep Tissue Pressure Injury - Purple or maroon localized area of discolored intact skin or blood-filled blister due to damage of underlying soft tissue from pressure and/or shear.  Wound Description (Comments): Right heel is purple. Skin intact. Elevated on 2 pillows.  Present on Admission: Yes     Pressure Injury  01/23/20 Sacrum Medial Stage 2 -  Partial thickness loss of dermis presenting as a shallow open injury with a red, pink wound bed without slough. st 2 to right cheek (Active)  01/23/20 1647  Location: Sacrum  Location Orientation: Medial  Staging: Stage 2 -  Partial thickness loss of dermis presenting as a shallow open injury with a red, pink wound bed without slough.  Wound Description (Comments): st 2 to right cheek  Present on Admission: Yes      LABORATORY PANEL:  CBC Recent Labs  Lab 01/27/20 0451  WBC 4.6  HGB 9.2*  HCT 28.3*  PLT 60*    Chemistries  Recent Labs  Lab 01/23/20 0949 01/24/20 0438 01/27/20 0451  NA 132*   < > 136  K 3.8   < > 4.0  CL 85*   < > 95*  CO2 27   < > 29  GLUCOSE 134*   < > 93  BUN 106*   < > 95*  CREATININE 7.58*   < > 7.29*  CALCIUM 7.8*   < > 7.6*  AST 13*  --   --   ALT 9  --   --   ALKPHOS 70  --   --   BILITOT 0.9  --   --    < > = values in this interval not displayed.   Cardiac Enzymes No results for input(s): TROPONINI in the last 168 hours. RADIOLOGY:  No results found. ASSESSMENT AND PLAN:   Blake [name] is a 86 y.o. male with medical history significant of hypertension, hyperlipidemia, diabetes mellitus, GERD, OSA, prostate cancer, urothelial carcinoma of the bladder, s/p of bilateral nephrostomy, pacemaker placement due to SSS, GI bleeding, dCHF, SVT, CKD-IV, thrombocytopenia, chronic respiratory failure on 2 L nasal cannula oxygen at night at home, who presents with decreased urine output.  Acute renal failure superimposed on stage 4 chronic kidney disease ([place]): Cre is up from 3.69 on 11/04/19 -->7.58, BUN 106. --upto 7.77 - Renal ultrasound showed no hydronephrosis. -Nephrology consultation with Dr. Arthor Captain-- appears renal function has deteriorated causing uremia/encephalopathy/poor urine output/overall decline -received IV fluids -hold torsemide -Avoid using renal toxic medications -7/16> Dter and pt wish  to start HD. Right groin HD access by Dr Lucky Cowboy -7/17> Started HD--1st treatment  Abnormal Urine--appears colonization with Chronic bilateral nephrostomy tubes -Was on IV aztreonam--will d/c it. No fever, wbc normal -blood culture negative - urine culture 70K Staph epidermidis  SVT (supraventricular tachycardia) (HCC) -Metoprolol  HLD (hyperlipidemia) -Crestor  Urothelial carcinoma of bladder and hx of prostate cancer -s/p chronic bilateral nephrostomy for ~1year -Proscar and Flomax  Acute respiratory failure with hypoxia: Likely due to fluid overload.  Patient has 2+ leg edema -As needed albuterol -Nasal cannula oxygen to maintain oxygen saturation above 93% -now on HD--hopefully some UF will be attempted  Chronic diastolic CHF (congestive heart failure) ([place]): 2D echo on 11/22/2018 showed EF 55-60%.  Patient has elevated BNP 2109, 2+ leg edema, indicating fluid overload.  Since patient has worsening renal function, will hold diuretics -Hold torsemide -Nasal cannula oxygen to maintain oxygen saturation above 93%  HTN (hypertension) -As needed hydralazine -Metoprolol  Type II  diabetes mellitus with renal manifestations [facility]): Recent A1c 7.5, patient is taking Januvia -SSI  Normocytic anemia due to Chronic disease - Hemoglobin 9.9 which was a 10.9 on 4/20/4/21.  No active bleeding tendency.  Chronic decubitus pressure ulcers present on admission -follow-up wound care   DVT ppx: SCD Code Status: Full code per dter Dr crisp on admission Family Communication:  Dr Raliegh Ip to call today 7/18. I spoke with dter on 7/16 in the room Spoke with son Blake jeffries in the room 7/18 Disposition Plan:  Anticipate discharge back to previous environment  Consults called:  Nephrology Admission status: Med-surg bed as inpt     Status is: Inpatient  Dispo: The patient is from: Home  Anticipated d/c is to: Home  Anticipated d/c date is: TBD   Patient currently is not medically stable to d/c. worsening kidney disease.  Pt now started on HD. Discharge pending how pt is tolerating further HD treatments   TOTAL TIME TAKING CARE OF THIS PATIENT: *20* minutes.  >50% time spent on counselling and coordination of care  Note: This dictation was prepared with Dragon dictation along with smaller phrase technology. Any transcriptional errors that result from this process are unintentional.  Blake [name] M.D    [facility]   CC: Primary care physician; Adin Hector, MDPatient ID: Blake [name], male   DOB: 04-05-1923, 86 y.o.   MRN: [id number]

## 2020-01-27 NOTE — Progress Notes (Signed)
Pt appears congested this AM. More pronounced coarse bread sounds and rhonchi bilaterally, no noticeable SOB. Also noted mild periorbital edema. IV fluids held for now. NP Randol Kern informed.

## 2020-01-28 ENCOUNTER — Encounter: Payer: Self-pay | Admitting: Vascular Surgery

## 2020-01-28 DIAGNOSIS — E785 Hyperlipidemia, unspecified: Secondary | ICD-10-CM | POA: Diagnosis not present

## 2020-01-28 DIAGNOSIS — I159 Secondary hypertension, unspecified: Secondary | ICD-10-CM | POA: Diagnosis not present

## 2020-01-28 DIAGNOSIS — N179 Acute kidney failure, unspecified: Secondary | ICD-10-CM | POA: Diagnosis not present

## 2020-01-28 DIAGNOSIS — I5032 Chronic diastolic (congestive) heart failure: Secondary | ICD-10-CM | POA: Diagnosis not present

## 2020-01-28 DIAGNOSIS — L89303 Pressure ulcer of unspecified buttock, stage 3: Secondary | ICD-10-CM

## 2020-01-28 LAB — CBC
HCT: 29.7 % — ABNORMAL LOW (ref 39.0–52.0)
Hemoglobin: 9.7 g/dL — ABNORMAL LOW (ref 13.0–17.0)
MCH: 28.7 pg (ref 26.0–34.0)
MCHC: 32.7 g/dL (ref 30.0–36.0)
MCV: 87.9 fL (ref 80.0–100.0)
Platelets: 52 10*3/uL — ABNORMAL LOW (ref 150–400)
RBC: 3.38 MIL/uL — ABNORMAL LOW (ref 4.22–5.81)
RDW: 17 % — ABNORMAL HIGH (ref 11.5–15.5)
WBC: 5.1 10*3/uL (ref 4.0–10.5)
nRBC: 0 % (ref 0.0–0.2)

## 2020-01-28 LAB — BASIC METABOLIC PANEL
Anion gap: 14 (ref 5–15)
BUN: 66 mg/dL — ABNORMAL HIGH (ref 8–23)
CO2: 25 mmol/L (ref 22–32)
Calcium: 7.7 mg/dL — ABNORMAL LOW (ref 8.9–10.3)
Chloride: 96 mmol/L — ABNORMAL LOW (ref 98–111)
Creatinine, Ser: 5.88 mg/dL — ABNORMAL HIGH (ref 0.61–1.24)
GFR calc Af Amer: 9 mL/min — ABNORMAL LOW (ref >60)
GFR calc non Af Amer: 7 mL/min — ABNORMAL LOW (ref >60)
Glucose, Bld: 132 mg/dL — ABNORMAL HIGH (ref 70–99)
Potassium: 3.5 mmol/L (ref 3.5–5.1)
Sodium: 135 mmol/L (ref 135–145)

## 2020-01-28 LAB — GLUCOSE, CAPILLARY
Glucose-Capillary: 77 mg/dL (ref 70–99)
Glucose-Capillary: 124 mg/dL — ABNORMAL HIGH (ref 70–99)
Glucose-Capillary: 118 mg/dL — ABNORMAL HIGH (ref 70–99)
Glucose-Capillary: 195 mg/dL — ABNORMAL HIGH (ref 70–99)

## 2020-01-28 LAB — RENAL FUNCTION PANEL
Albumin: 2.7 g/dL — ABNORMAL LOW (ref 3.5–5.0)
Anion gap: 16 — ABNORMAL HIGH (ref 5–15)
BUN: 103 mg/dL — ABNORMAL HIGH (ref 8–23)
CO2: 26 mmol/L (ref 22–32)
Calcium: 7.8 mg/dL — ABNORMAL LOW (ref 8.9–10.3)
Chloride: 93 mmol/L — ABNORMAL LOW (ref 98–111)
Creatinine, Ser: 7.92 mg/dL — ABNORMAL HIGH (ref 0.61–1.24)
GFR calc Af Amer: 6 mL/min — ABNORMAL LOW (ref >60)
GFR calc non Af Amer: 5 mL/min — ABNORMAL LOW (ref >60)
Glucose, Bld: 97 mg/dL (ref 70–99)
Phosphorus: 8.3 mg/dL — ABNORMAL HIGH (ref 2.5–4.6)
Potassium: 3.8 mmol/L (ref 3.5–5.1)
Sodium: 135 mmol/L (ref 135–145)

## 2020-01-28 LAB — CULTURE, BLOOD (ROUTINE X 2)
Culture: NO GROWTH
Culture: NO GROWTH
Special Requests: ADEQUATE
Special Requests: ADEQUATE

## 2020-01-28 MED ORDER — EPOETIN ALFA 10000 UNIT/ML IJ SOLN
10000.0000 [IU] | INTRAMUSCULAR | Status: DC
  Administered 2020-01-29 – 2020-02-06 (×4): 10000 [IU] via INTRAVENOUS

## 2020-01-28 MED ORDER — METOPROLOL TARTRATE 5 MG/5ML IV SOLN
10.0000 mg | Freq: Once | INTRAVENOUS | Status: AC
  Administered 2020-01-28: 10 mg via INTRAVENOUS
  Filled 2020-01-28: qty 10

## 2020-01-28 NOTE — Progress Notes (Signed)
Cross Cover Brief Note  Refused oral  Metorolol.  IV dose ordered

## 2020-01-28 NOTE — Progress Notes (Signed)
Notified Sharion Settler regarding patient's increased HR- 127. Orders given for IV metoprolol because patient had been refusing oral meds during the day.  Rechecked vitals in an hour and HR remains the same- 127. Will recheck vitals in 4 hours per yellow mews guidelines.  Christene Slates 01/28/2020  10:01 PM

## 2020-01-28 NOTE — Progress Notes (Signed)
OT Cancellation Note  Patient Details Name: Blake [name] MRN: [id number] DOB: 1923/06/15   Cancelled Treatment:    Reason Eval/Treat Not Completed: Medical issues which prohibited therapy;Fatigue/lethargy limiting ability to participate. Consult received, chart reviewed. Spoke with RN via secure chat. Per RN, pt very lethargic since dialysis earlier today. HR elevated (120's at rest), BP elevated. Per RN, hold therapy and re-attempt next date as appropriate.   Jeni Salles, MPH, MS, OTR/L ascom [phone] 01/28/20, 3:16 PM

## 2020-01-28 NOTE — Care Management Important Message (Signed)
Important Message  Patient Details  Name: Blake [name] MRN: [id number] Date of Birth: 03-29-1923   Medicare Important Message Given:  Yes     [name] 01/28/2020, 12:32 PM

## 2020-01-28 NOTE — Progress Notes (Signed)
[facility]  ROUNDING NOTE   Subjective:   Seen and examined on hemodialysis treatment. More awake and alert than yesterday. Tolerating treatment well.     HEMODIALYSIS FLOWSHEET:  Blood Flow Rate (mL/min): 250 mL/min Arterial Pressure (mmHg): -120 mmHg Venous Pressure (mmHg): 70 mmHg Transmembrane Pressure (mmHg): 50 mmHg Ultrafiltration Rate (mL/min): 120 mL/min Dialysate Flow Rate (mL/min): 500 ml/min Conductivity: Machine : 14 Conductivity: Machine : 14 Dialysis Fluid Bolus: Normal Saline Bolus Amount (mL): 200 mL    Objective:  Vital signs in last 24 hours:  Temp:  [97.7 F (36.5 C)-98 F (36.7 C)] 97.7 F (36.5 C) (07/19 1015) Pulse Rate:  [80-125] 125 (07/19 1133) Resp:  [17-25] 22 (07/19 1133) BP: (113-152)/(66-97) 152/97 (07/19 1133) SpO2:  [99 %-100 %] 99 % (07/19 1015) Weight:  [95.7 kg] 95.7 kg (07/19 0429)  Weight change: 5.6 kg Filed Weights   01/25/20 0500 01/26/20 0840 01/28/20 0429  Weight: 96.9 kg 90.1 kg 95.7 kg    Intake/Output: I/O last 3 completed shifts: In: 523.8 [P.O.:120; I.V.:403.8] Out: 240 [Urine:240]   Intake/Output this shift:  Total I/O In: 113 [P.O.:113] Out: -   Physical Exam: General: NAD, laying in bed  Head: Normocephalic, atraumatic. Moist oral mucosal membranes  Eyes: Anicteric, PERRL  Neck: Supple, trachea midline  Lungs:  +rhonchi bilaterally   Heart: Regularly irregular   Abdomen:  Soft, nontender,   Extremities:  2+ peripheral edema.  Neurologic: Nonfocal, moving all four extremities, Alert to self, situation and place  Skin: No lesions  Access: Left femoral dialysis catheter 7/16 Dr. Lucky Cowboy    Basic Metabolic Panel: Recent Labs  Lab 01/23/20 9767 01/23/20 0949 01/24/20 3419 01/24/20 3790 01/25/20 0457 01/26/20 0807 01/27/20 0451 01/28/20 1017  NA 132*  --  132*  --  132*  --  136 135  K 3.8  --  3.8  --  4.1  --  4.0 3.8  CL 85*  --  89*  --  89*  --  95* 93*  CO2 27  --  27  --  27   --  29 26  GLUCOSE 134*  --  117*  --  101*  --  93 97  BUN 106*  --  102*  --  100*  --  95* 103*  CREATININE 7.58*  --  7.42*  --  7.77*  --  7.29* 7.92*  CALCIUM 7.8*   < > 7.7*   < > 7.6*  --  7.6* 7.8*  PHOS  --   --   --   --   --  8.6* 7.8* 8.3*   < > = values in this interval not displayed.    Liver Function Tests: Recent Labs  Lab 01/23/20 0949 01/27/20 0451 01/28/20 1017  AST 13*  --   --   ALT 9  --   --   ALKPHOS 70  --   --   BILITOT 0.9  --   --   PROT 7.2  --   --   ALBUMIN 3.0* 2.7* 2.7*   No results for input(s): LIPASE, AMYLASE in the last 168 hours. No results for input(s): AMMONIA in the last 168 hours.  CBC: Recent Labs  Lab 01/23/20 0949 01/24/20 0438 01/27/20 0451 01/28/20 1017  WBC 5.6 5.4 4.6 5.1  NEUTROABS 4.5  --   --   --   HGB 9.9* 9.9* 9.2* 9.7*  HCT 30.0* 29.8* 28.3* 29.7*  MCV 87.5 86.1 87.9 87.9  PLT 78* 70* 60* 52*    Cardiac Enzymes: No results for input(s): CKTOTAL, CKMB, CKMBINDEX, TROPONINI in the last 168 hours.  BNP: Invalid input(s): POCBNP  CBG: Recent Labs  Lab 01/27/20 0734 01/27/20 1209 01/27/20 1653 01/27/20 2126 01/28/20 0751  GLUCAP 85 80 90 94 77    Microbiology: Results for orders placed or performed during the hospital encounter of 01/23/20  Urine culture     Status: Abnormal   Collection Time: 01/23/20  9:40 AM   Specimen: Urine, Random  Result Value Ref Range Status   Specimen Description   Final    URINE, RANDOM Performed at [facility], [address] [place], [place] [address]    Special Requests   Final    NONE Performed at [facility], [place], [place] [address]    Culture 70,000 COLONIES/mL STAPHYLOCOCCUS EPIDERMIDIS (A)  Final   Report Status 01/26/2020 FINAL  Final   Organism ID, Bacteria STAPHYLOCOCCUS EPIDERMIDIS (A)  Final      Susceptibility   Staphylococcus epidermidis - MIC*    CIPROFLOXACIN >=8 RESISTANT Resistant     GENTAMICIN <=0.5  SENSITIVE Sensitive     NITROFURANTOIN <=16 SENSITIVE Sensitive     OXACILLIN >=4 RESISTANT Resistant     TETRACYCLINE 2 SENSITIVE Sensitive     VANCOMYCIN 1 SENSITIVE Sensitive     TRIMETH/SULFA 80 RESISTANT Resistant     CLINDAMYCIN <=0.25 SENSITIVE Sensitive     RIFAMPIN <=0.5 SENSITIVE Sensitive     Inducible Clindamycin NEGATIVE Sensitive     * 70,000 COLONIES/mL STAPHYLOCOCCUS EPIDERMIDIS  SARS Coronavirus 2 by RT PCR (hospital order, performed in [place] hospital lab) Nasopharyngeal Nasopharyngeal Swab     Status: None   Collection Time: 01/23/20  4:00 PM   Specimen: Nasopharyngeal Swab  Result Value Ref Range Status   SARS Coronavirus 2 NEGATIVE NEGATIVE Final    Comment: (NOTE) SARS-CoV-2 target nucleic acids are NOT DETECTED.  The SARS-CoV-2 RNA is generally detectable in upper and lower respiratory specimens during the acute phase of infection. The lowest concentration of SARS-CoV-2 viral copies this assay can detect is 250 copies / mL. A negative result does not preclude SARS-CoV-2 infection and should not be used as the sole basis for treatment or other patient management decisions.  A negative result may occur with improper specimen collection / handling, submission of specimen other than nasopharyngeal swab, presence of viral mutation(s) within the areas targeted by this assay, and inadequate number of viral copies (<250 copies / mL). A negative result must be combined with clinical observations, patient history, and epidemiological information.  Fact Sheet for Patients:   StrictlyIdeas.no  Fact Sheet for Healthcare Providers: BankingDealers.co.za  This test is not yet approved or  cleared by the [place] FDA and has been authorized for detection and/or diagnosis of SARS-CoV-2 by FDA under an Emergency Use Authorization (EUA).  This EUA will remain in effect (meaning this test can be used) for the duration of  the COVID-19 declaration under Section 564(b)(1) of the Act, 21 U.S.C. section 360bbb-3(b)(1), unless the authorization is terminated or revoked sooner.  Performed at [facility], [address] [place], [place] [address]   CULTURE, BLOOD (ROUTINE X 2) w Reflex to ID Panel     Status: None   Collection Time: 01/23/20  5:13 PM   Specimen: BLOOD  Result Value Ref Range Status   Specimen Description BLOOD BLOOD LEFT HAND  Final   Special Requests  Final    BOTTLES DRAWN AEROBIC AND ANAEROBIC Blood Culture adequate volume   Culture   Final    NO GROWTH 5 DAYS Performed at [facility], [address] [place], [place] [address]    Report Status 01/28/2020 FINAL  Final  CULTURE, BLOOD (ROUTINE X 2) w Reflex to ID Panel     Status: None   Collection Time: 01/23/20  6:12 PM   Specimen: BLOOD  Result Value Ref Range Status   Specimen Description BLOOD BLOOD LEFT WRIST  Final   Special Requests Blood Culture adequate volume  Final   Culture   Final    NO GROWTH 5 DAYS Performed at [facility], [address] [place], [place] [address]    Report Status 01/28/2020 FINAL  Final  MRSA PCR Screening     Status: Abnormal   Collection Time: 01/25/20  8:17 AM   Specimen: Nasopharyngeal  Result Value Ref Range Status   MRSA by PCR POSITIVE (A) NEGATIVE Final    Comment:        The GeneXpert MRSA Assay (FDA approved for NASAL specimens only), is one component of a comprehensive MRSA colonization surveillance program. It is not intended to diagnose MRSA infection nor to guide or monitor treatment for MRSA infections. RESULT CALLED TO, READ BACK BY AND VERIFIED WITH: MARIA [facility] 01/25/20 1945 S[facility] Performed at [facility], [address] [place], [place] [address]     Coagulation Studies: No results for input(s): LABPROT, INR in the last 72 hours.  Urinalysis: No results for input(s): COLORURINE, LABSPEC, PHURINE, GLUCOSEU, HGBUR,  BILIRUBINUR, KETONESUR, PROTEINUR, UROBILINOGEN, NITRITE, LEUKOCYTESUR in the last 72 hours.  Invalid input(s): APPERANCEUR    Imaging: No results found.   Medications:   . sodium chloride    . sodium chloride     . ferrous sulfate  325 mg Oral Q breakfast   And  . vitamin C  250 mg Oral Q breakfast  . azelastine  1 spray Each Nare BID  . Chlorhexidine Gluconate Cloth  6 each Topical Daily  . Chlorhexidine Gluconate Cloth  6 each Topical Q0600  . feeding supplement (NEPRO CARB STEADY)  237 mL Oral BID BM  . finasteride  5 mg Oral Daily  . insulin aspart  0-5 Units Subcutaneous QHS  . insulin aspart  0-9 Units Subcutaneous TID WC  . mouth rinse  15 mL Mouth Rinse BID  . metoprolol tartrate  37.5 mg Oral BID  . multivitamin-lutein  1 capsule Oral BID  . mupirocin ointment  1 application Nasal BID  . pantoprazole  40 mg Oral Daily  . rosuvastatin  5 mg Oral QHS  . sodium bicarbonate  650 mg Oral BID  . tamsulosin  0.4 mg Oral Daily   sodium chloride, sodium chloride, acetaminophen, albuterol, alteplase, heparin, lidocaine (PF), lidocaine-prilocaine, ondansetron (ZOFRAN) IV, pentafluoroprop-tetrafluoroeth  Assessment/ Plan:  [name] is a 86 y.o. black male with acute renal failure on hemodialysis, hypertension, hyperlipidemia, diabetes mellitus type 2, GERD, obstructive sleep apnea, urothelial carcinoma of the bladder status post bilateral nephrostomy tube placement, permanent pacemaker placement secondary to sick sinus syndrome, SVT, who is admitted to [facility] on 01/23/2020 for Weakness [R53.1] SOB (shortness of breath) [R06.02] Acute renal failure superimposed on stage 4 chronic kidney disease ([place]) [N17.9, N18.4] Acute renal failure superimposed on chronic kidney disease, unspecified CKD stage, unspecified acute renal failure type ([place]) [N17.9, N18.9] Patient initiated on hemodialysis on 7/17 for uremic symptoms.  1. Acute kidney injury on chronic kidney disease  stage IV secondary to obstructive uropathy from urothelial carcinoma status post bilateral nephrostomy tube placement. Seen and examined on hemodialysis. Clinically looking better.  - Third dialysis treatment for tomorrow. Treatment to be done in a chair. If tolerates treatments, will place a tunneled catheter.  - Will discuss with family later today.  - Continue sodium bicarbonate.   2. Anemia of chronic kidney disease. Hemoglobin 9.7  No ESA given this admission.   3. Hypertension - metoprolol was last given on Saturday 7/17.     LOS: 5 Christpoher Sievers 7/19/202111:44 AM

## 2020-01-28 NOTE — Progress Notes (Addendum)
Blake [name] at [place] NAME: Blake [name]    MR#:  [id number]  DATE OF BIRTH:  [birth year]  SUBJECTIVE:   seems to have been tolerating HD well today More alert  REVIEW OF SYSTEMS:   Review of Systems  Unable to perform ROS: Medical condition   Tolerating Diet:very little Tolerating PT: pending--unable to do due to groin HD  DRUG ALLERGIES:   Allergies  Allergen Reactions  . Cefepime     seizure  . Diovan [Valsartan] Cough  . Gabapentin Other (See Comments)    Sedation at all doses  . Hydralazine Itching  . Lisinopril Cough  . Pregabalin Other (See Comments)    Sedation at all doses  . Levofloxacin Other (See Comments)    Too many side effects. Nausea, vomiting, upset stomach, increased confusion, etc Other reaction(s): Confusion Nausea, chills Nausea, chills   . Sulfamethoxazole-Trimethoprim Other (See Comments)    Too many side effects. Nausea, vomiting, upset stomach, increased confusion, etc    VITALS:  Blood pressure 136/88, pulse (!) 120, temperature 97.7 F (36.5 C), temperature source Oral, resp. rate (!) 22, height 6' (1.829 m), weight 95.7 kg, SpO2 100 %.  PHYSICAL EXAMINATION:   Physical Exam limited exam due to patient participation and decline in condition GENERAL:  86 y.o.-year-old patient lying in the bed with no acute distress. Chronically ill and debilitated LUNGS: coarse breath sounds bilaterally, no wheezing, rales, rhonchi. No use of accessory muscles of respiration.  CARDIOVASCULAR: S1, S2 normal. No murmurs, rubs, or gallops.  ABDOMEN: Soft, nontender, nondistended. Bowel sounds present. No organomegaly or mass. Chronic Bilateral nephrostomy tubes+ EXTREMITIES: No cyanosis, clubbing , + edema b/l.   Right groin catheter+ placed 7/16 NEUROLOGIC: unable to assess. Patient quite weak and lethargic  PSYCHIATRIC:  patient is lethargic SKIN: Pressure Injury 11/21/18 Buttocks Bilateral;Left;Right Stage II -   Partial thickness loss of dermis presenting as a shallow open ulcer with a red, pink wound bed without slough. pink (Active)  11/21/18 2350  Location: Buttocks  Location Orientation: Bilateral;Left;Right  Staging: Stage II -  Partial thickness loss of dermis presenting as a shallow open ulcer with a red, pink wound bed without slough.  Wound Description (Comments): pink  Present on Admission: Yes     Pressure Injury 01/01/19 Buttocks Left Stage I -  Intact skin with non-blanchable redness of a localized area usually over a bony prominence. (Active)  01/01/19 0805  Location: Buttocks  Location Orientation: Left  Staging: Stage I -  Intact skin with non-blanchable redness of a localized area usually over a bony prominence.  Wound Description (Comments):   Present on Admission: Yes     Pressure Injury 09/25/19 Sacrum Stage 3 -  Full thickness tissue loss. Subcutaneous fat may be visible but bone, tendon or muscle are NOT exposed. 1cm x 1cm . Cleansed and Foam applied (Active)  09/25/19 0200  Location: Sacrum  Location Orientation:   Staging: Stage 3 -  Full thickness tissue loss. Subcutaneous fat may be visible but bone, tendon or muscle are NOT exposed.  Wound Description (Comments): 1cm x 1cm . Cleansed and Foam applied  Present on Admission: Yes     Pressure Injury 09/25/19 Heel Right Deep Tissue Pressure Injury - Purple or maroon localized area of discolored intact skin or blood-filled blister due to damage of underlying soft tissue from pressure and/or shear. Right heel is purple. Skin intact. Ele (Active)  09/25/19 0200  Location: Heel  Location  Orientation: Right  Staging: Deep Tissue Pressure Injury - Purple or maroon localized area of discolored intact skin or blood-filled blister due to damage of underlying soft tissue from pressure and/or shear.  Wound Description (Comments): Right heel is purple. Skin intact. Elevated on 2 pillows.  Present on Admission: Yes     Pressure  Injury 01/23/20 Sacrum Medial Stage 2 -  Partial thickness loss of dermis presenting as a shallow open injury with a red, pink wound bed without slough. st 2 to right cheek (Active)  01/23/20 1647  Location: Sacrum  Location Orientation: Medial  Staging: Stage 2 -  Partial thickness loss of dermis presenting as a shallow open injury with a red, pink wound bed without slough.  Wound Description (Comments): st 2 to right cheek  Present on Admission: Yes      LABORATORY PANEL:  CBC Recent Labs  Lab 01/28/20 1017  WBC 5.1  HGB 9.7*  HCT 29.7*  PLT 52*    Chemistries  Recent Labs  Lab 01/23/20 0949 01/24/20 0438 01/28/20 1017  NA 132*   < > 135  K 3.8   < > 3.8  CL 85*   < > 93*  CO2 27   < > 26  GLUCOSE 134*   < > 97  BUN 106*   < > 103*  CREATININE 7.58*   < > 7.92*  CALCIUM 7.8*   < > 7.8*  AST 13*  --   --   ALT 9  --   --   ALKPHOS 70  --   --   BILITOT 0.9  --   --    < > = values in this interval not displayed.   Cardiac Enzymes No results for input(s): TROPONINI in the last 168 hours. RADIOLOGY:  No results found. ASSESSMENT AND PLAN:   Blake [name] is a 86 y.o. male with medical history significant of hypertension, hyperlipidemia, diabetes mellitus, GERD, OSA, prostate cancer, urothelial carcinoma of the bladder, s/p of bilateral nephrostomy, pacemaker placement due to SSS, GI bleeding, dCHF, SVT, CKD-IV, thrombocytopenia, chronic respiratory failure on 2 L nasal cannula oxygen at night at home, who presents with decreased urine output.  Acute renal failure superimposed on stage 4 chronic kidney disease ([place]): Cre is up from 3.69 on 11/04/19 -->7.58, BUN 106. --upto 7.77-- HD - Renal ultrasound showed no hydronephrosis. -Nephrology consultation with Dr. Arthor Captain-- appears renal function has deteriorated causing uremia/encephalopathy/poor urine output/overall decline -received IV fluids -hold torsemide -Avoid using renal toxic medications -7/16> Dter  and pt wish to start HD. Right groin HD access by Dr Lucky Cowboy -7/17> Started HD--1st treatment  Abnormal Urine--appears colonization with Chronic bilateral nephrostomy tubes -Was on IV aztreonam--will d/c it. No fever, wbc normal -blood culture negative - urine culture 70K Staph epidermidis  SVT (supraventricular tachycardia) (H[facility]) -Metoprolol  HLD (hyperlipidemia) -Crestor  Urothelial carcinoma of bladder and hx of prostate cancer -s/p chronic bilateral nephrostomy for ~1year -Proscar and Flomax  Acute respiratory failure with hypoxia: Likely due to fluid overload.  Patient has 2+ leg edema -As needed albuterol -Nasal cannula oxygen to maintain oxygen saturation above 93% -now on HD--hopefully some UF will be attempted  Chronic diastolic CHF (congestive heart failure) ([place]): 2D echo on 11/22/2018 showed EF 55-60%.  Patient has elevated BNP 2109, 2+ leg edema, indicating fluid overload.  Since patient has worsening renal function, will hold diuretics -Hold torsemide -Nasal cannula oxygen to maintain oxygen saturation above 93%  HTN (hypertension) -As needed hydralazine -  Metoprolol  Type II diabetes mellitus with renal manifestations [facility]): Recent A1c 7.5, patient is taking Januvia -SSI  Normocytic anemia due to Chronic disease - Hemoglobin 9.9 which was a 10.9 on 4/20/4/21.  No active bleeding tendency.  Chronic decubitus pressure ulcers present on admission -follow-up wound care   DVT ppx: SCD Code Status: Full code per dter Dr crisp on admission Family Communication:  met Mr Blaisdell -son today Disposition Plan:  Anticipate discharge back to previous environment  Consults called:  Nephrology Admission status: Med-surg bed as inpt     Status is: Inpatient  Dispo: The patient is from: Home  Anticipated d/c is to: Home  Anticipated d/c date is: TBD  Patient currently is not medically stable to d/c. Pt now started on HD.  Discharge pending how pt is tolerating further HD treatments pt will need permanent access also. Dr Raliegh Ip to determine it.   TOTAL TIME TAKING CARE OF THIS PATIENT: *20* minutes.  >50% time spent on counselling and coordination of care  Note: This dictation was prepared with Dragon dictation along with smaller phrase technology. Any transcriptional errors that result from this process are unintentional.  Fritzi Mandes M.D    [facility]   CC: Primary care physician; Adin Hector, MDPatient ID: Blake [name], male   DOB: 09/08/1922, 86 y.o.   MRN: [id number]

## 2020-01-29 ENCOUNTER — Inpatient Hospital Stay: Payer: Medicare PPO

## 2020-01-29 ENCOUNTER — Other Ambulatory Visit: Payer: Self-pay | Admitting: Radiology

## 2020-01-29 DIAGNOSIS — N1330 Unspecified hydronephrosis: Secondary | ICD-10-CM

## 2020-01-29 DIAGNOSIS — N179 Acute kidney failure, unspecified: Secondary | ICD-10-CM | POA: Diagnosis not present

## 2020-01-29 DIAGNOSIS — E785 Hyperlipidemia, unspecified: Secondary | ICD-10-CM | POA: Diagnosis not present

## 2020-01-29 DIAGNOSIS — I159 Secondary hypertension, unspecified: Secondary | ICD-10-CM | POA: Diagnosis not present

## 2020-01-29 DIAGNOSIS — I5032 Chronic diastolic (congestive) heart failure: Secondary | ICD-10-CM | POA: Diagnosis not present

## 2020-01-29 LAB — MAGNESIUM: Magnesium: 2.4 mg/dL (ref 1.7–2.4)

## 2020-01-29 LAB — RENAL FUNCTION PANEL
Albumin: 2.7 g/dL — ABNORMAL LOW (ref 3.5–5.0)
Anion gap: 13 (ref 5–15)
BUN: 67 mg/dL — ABNORMAL HIGH (ref 8–23)
CO2: 28 mmol/L (ref 22–32)
Calcium: 7.8 mg/dL — ABNORMAL LOW (ref 8.9–10.3)
Chloride: 97 mmol/L — ABNORMAL LOW (ref 98–111)
Creatinine, Ser: 6.55 mg/dL — ABNORMAL HIGH (ref 0.61–1.24)
GFR calc Af Amer: 8 mL/min — ABNORMAL LOW (ref >60)
GFR calc non Af Amer: 7 mL/min — ABNORMAL LOW (ref >60)
Glucose, Bld: 170 mg/dL — ABNORMAL HIGH (ref 70–99)
Phosphorus: 5.9 mg/dL — ABNORMAL HIGH (ref 2.5–4.6)
Potassium: 3.7 mmol/L (ref 3.5–5.1)
Sodium: 138 mmol/L (ref 135–145)

## 2020-01-29 LAB — CBC
HCT: 29.4 % — ABNORMAL LOW (ref 39.0–52.0)
Hemoglobin: 9.5 g/dL — ABNORMAL LOW (ref 13.0–17.0)
MCH: 28.6 pg (ref 26.0–34.0)
MCHC: 32.3 g/dL (ref 30.0–36.0)
MCV: 88.6 fL (ref 80.0–100.0)
Platelets: 52 10*3/uL — ABNORMAL LOW (ref 150–400)
RBC: 3.32 MIL/uL — ABNORMAL LOW (ref 4.22–5.81)
RDW: 17.1 % — ABNORMAL HIGH (ref 11.5–15.5)
WBC: 5.7 10*3/uL (ref 4.0–10.5)
nRBC: 0 % (ref 0.0–0.2)

## 2020-01-29 LAB — BRAIN NATRIURETIC PEPTIDE: B Natriuretic Peptide: 4446.4 pg/mL — ABNORMAL HIGH (ref 0.0–100.0)

## 2020-01-29 LAB — GLUCOSE, CAPILLARY
Glucose-Capillary: 121 mg/dL — ABNORMAL HIGH (ref 70–99)
Glucose-Capillary: 104 mg/dL — ABNORMAL HIGH (ref 70–99)
Glucose-Capillary: 175 mg/dL — ABNORMAL HIGH (ref 70–99)

## 2020-01-29 MED ORDER — METOPROLOL TARTRATE 5 MG/5ML IV SOLN
5.0000 mg | Freq: Four times a day (QID) | INTRAVENOUS | Status: DC | PRN
  Administered 2020-01-29: 5 mg via INTRAVENOUS
  Filled 2020-01-29: qty 5

## 2020-01-29 MED ORDER — METOPROLOL TARTRATE 5 MG/5ML IV SOLN: 5.0000 mg | Freq: Four times a day (QID) | INTRAVENOUS | Status: DC

## 2020-01-29 MED ORDER — METOPROLOL TARTRATE 5 MG/5ML IV SOLN
10.0000 mg | Freq: Once | INTRAVENOUS | Status: AC
  Administered 2020-01-29: 10 mg via INTRAVENOUS
  Filled 2020-01-29: qty 10

## 2020-01-29 MED ORDER — METOPROLOL TARTRATE 5 MG/5ML IV SOLN
10.0000 mg | INTRAVENOUS | Status: DC | PRN
  Administered 2020-02-08 – 2020-02-09 (×2): 10 mg via INTRAVENOUS
  Filled 2020-01-29 (×2): qty 10

## 2020-01-29 NOTE — Progress Notes (Signed)
PT Cancellation Note  Patient Details Name: Blake [name] MRN: [id number] DOB: [birth year]   Cancelled Treatment:    Reason Eval/Treat Not Completed: Patient at procedure or test/unavailable;Medical issues which prohibited therapy Patient currently at dialysis. Also noted patient has right femoral vein triple lumen temporary catheter for dialysis access that will prohibit assessment of out of bed mobility. PT will follow up as appropriate.   Minna Merritts, PT, MPT  Percell Locus 01/29/2020, 1:09 PM

## 2020-01-29 NOTE — Progress Notes (Signed)
Notified Sharion Settler regarding patient's elevated HR-131 and increased RR.  Hassan Rowan came to assess patient and decided that he was in fluid overload. Orders were given for chest xray because of the tachycardia. Orders were also placed for prn metoprolol IV 10mg  for HR over 130. Gave medication and 2200 vitals showed HR of 75. It is now hanging in the low 100's. Will continue red mews guidelines every 4 hours.  Christene Slates 01/29/2020  11:32 PM

## 2020-01-29 NOTE — Progress Notes (Signed)
Cross Cover Brief Note Nurse calls reports tachycardia and tachypnea with MEWS 4 Bedside patient denies distress.  [place]. Bilateral failnt crackle. 2+ pitting edema - anasarca appearance I & O over 4 liter + HD today 1 liter removal  CXR IMPRESSION: 1. Large right pleural effusion with near complete compressive atelectasis of the right lung. Overall significant progression of right lung opacity since the prior radiograph. Pneumonia or underlying mass is not excluded. Clinical correlation and follow-up to resolution recommended. 2. Interval increase in the size of the left pleural effusion and left lung base atelectasis or infiltrate.  No leukocytosis or fever

## 2020-01-29 NOTE — Progress Notes (Signed)
OT Cancellation Note  Patient Details Name: Blake [name] MRN: [id number] DOB: 05-Jan-1923   Cancelled Treatment:    Reason Eval/Treat Not Completed: Patient at procedure or test/ unavailable  Pt off floor for HD at this time. In addition, pt has elevated HR and RR. In addition, pt with temporary femoral HD cath. Will f/u for OT evaluation at later date/time as appropriate.   Gerrianne Scale, [place], OTR/L ascom [phone] 01/29/20, 1:04 PM

## 2020-01-29 NOTE — Evaluation (Signed)
Clinical/Bedside Swallow Evaluation Patient Details  Name: Blake [name] MRN: [id number] Date of Birth: 1923-06-27  Today's Date: 01/29/2020 Time: SLP Start Time (ACUTE ONLY): 0825 SLP Stop Time (ACUTE ONLY): 0855 SLP Time Calculation (min) (ACUTE ONLY): 30 min  Past Medical History:  Past Medical History:  Diagnosis Date  . AAA (abdominal aortic aneurysm) ([place])   . Arrhythmia   . CHF (congestive heart failure) ([place])   . Diabetes mellitus without complication ([place])   . GERD (gastroesophageal reflux disease)   . GI bleed   . Heart murmur   . Hyperlipidemia   . Hypertension   . Pacemaker   . Pneumonia   . Prostate cancer ([place])   . Sleep apnea    Past Surgical History:  Past Surgical History:  Procedure Laterality Date  . FLEXIBLE SIGMOIDOSCOPY N/A 10/21/2016   Procedure: FLEXIBLE SIGMOIDOSCOPY;  Surgeon: [name], MD;  Location: [facility] ENDOSCOPY;  Service: Endoscopy;  Laterality: N/A;  . INSERT / REPLACE / REMOVE PACEMAKER    . IR NEPHROSTOGRAM LEFT THRU EXISTING ACCESS  01/16/2019  . IR NEPHROSTOMY EXCHANGE LEFT  02/06/2019  . IR NEPHROSTOMY EXCHANGE LEFT  04/12/2019  . IR NEPHROSTOMY EXCHANGE LEFT  06/01/2019  . IR NEPHROSTOMY EXCHANGE LEFT  07/20/2019  . IR NEPHROSTOMY EXCHANGE LEFT  09/19/2019  . IR NEPHROSTOMY EXCHANGE LEFT  11/09/2019  . IR NEPHROSTOMY EXCHANGE LEFT  12/28/2019  . IR NEPHROSTOMY EXCHANGE RIGHT  02/06/2019  . IR NEPHROSTOMY EXCHANGE RIGHT  04/12/2019  . IR NEPHROSTOMY EXCHANGE RIGHT  06/01/2019  . IR NEPHROSTOMY EXCHANGE RIGHT  07/20/2019  . IR NEPHROSTOMY EXCHANGE RIGHT  09/19/2019  . IR NEPHROSTOMY EXCHANGE RIGHT  11/09/2019  . IR NEPHROSTOMY EXCHANGE RIGHT  12/28/2019  . IR NEPHROSTOMY PLACEMENT RIGHT  01/01/2019  . JOINT REPLACEMENT    . NEPHROSTOMY Bilateral   . PACEMAKER INSERTION Left 04/14/2015   Procedure: INSERTION PACEMAKER;  Surgeon: Isaias Cowman, MD;  Location: [facility] ORS;  Service: Cardiovascular;  Laterality: Left;  . PROSTATE SURGERY    .  TEMPORARY DIALYSIS CATHETER N/A 01/25/2020   Procedure: TEMPORARY DIALYSIS CATHETER;  Surgeon: Algernon Huxley, MD;  Location: [facility];  Service: Cardiovascular;  Laterality: N/A;  . VISCERAL ARTERY INTERVENTION N/A 10/22/2016   Procedure: Visceral Artery Intervention;  Surgeon: Algernon Huxley, MD;  Location: [facility];  Service: Cardiovascular;  Laterality: N/A;   HPI:  Blake [name] is a 86 y.o. male with medical history significant of hypertension, hyperlipidemia, diabetes mellitus, GERD, OSA, prostate cancer, urothelial carcinoma of the bladder, s/p of bilateral nephrostomy, pacemaker placement due to SSS, GI bleeding, dCHF, SVT, CKD-IV, thrombocytopenia, chronic respiratory failure on 2 L nasal cannula oxygen at night at home, who presents with decreased urine output. Pt with end stage renal disease with HD started.    Assessment / Plan / Recommendation Clinical Impression  Pt presents with mild-moderate oral phase dysphagia that is c/b impaired mastication, decreased lingual movement of bolus and as a result prolonged oral phase when consuming puree. More advanced solids were not attempted d/t impaired mastication. Pt currently on dysphagia 1 diet and consumed all of his breakfast tray this morning. Pt appears to have functional pharyngeal swallow when consuming thin liquids via straw and puree. At this time, pt appears at reduced risk of aspiration when consuming dysphagia 1 diet with upgrade to thin liquids via straw. ST to follow for possible diet advancement.   SLP Visit Diagnosis: Dysphagia, oral phase (R13.11)  Aspiration Risk  Mild aspiration risk    Diet Recommendation   Dysphagia 1 with thin liquids via straw or cup  Medication Administration: Whole meds with puree    Other  Recommendations Oral Care Recommendations: Oral care BID   Follow up Recommendations  (TBD)      Frequency and Duration min 2x/week  2 weeks       Prognosis Prognosis for Safe  Diet Advancement: Fair Barriers to Reach Goals: Motivation      Swallow Study   General Date of Onset: 01/28/20 HPI: Blake [name] is a 86 y.o. male with medical history significant of hypertension, hyperlipidemia, diabetes mellitus, GERD, OSA, prostate cancer, urothelial carcinoma of the bladder, s/p of bilateral nephrostomy, pacemaker placement due to SSS, GI bleeding, dCHF, SVT, CKD-IV, thrombocytopenia, chronic respiratory failure on 2 L nasal cannula oxygen at night at home, who presents with decreased urine output. Pt with end stage renal disease with HD started.  Type of Study: Bedside Swallow Evaluation Previous Swallow Assessment: BSE March 2021 Diet Prior to this Study: Dysphagia 1 (puree);Nectar-thick liquids Temperature Spikes Noted: No Respiratory Status: Nasal cannula History of Recent Intubation: No Behavior/Cognition: Alert;Cooperative;Pleasant mood Oral Cavity Assessment: Within Functional Limits Oral Care Completed by SLP: Recent completion by staff Oral Cavity - Dentition: Edentulous Self-Feeding Abilities: Needs assist;Total assist Patient Positioning: Upright in bed Baseline Vocal Quality: Normal Volitional Cough: Strong Volitional Swallow: Able to elicit    Oral/Motor/Sensory Function Overall Oral Motor/Sensory Function: Within functional limits   Ice Chips Ice chips: Not tested   Thin Liquid Thin Liquid: Within functional limits Presentation: Straw    Nectar Thick Nectar Thick Liquid: Not tested   Honey Thick Honey Thick Liquid: Not tested   Puree Puree: Impaired Presentation: Spoon Oral Phase Impairments: Impaired mastication;Reduced lingual movement/coordination Oral Phase Functional Implications: Prolonged oral transit   Solid     Solid: Not tested     Blake [name] B. Rutherford Nail M.S., CCC-SLP, [place] Office [phone]  Lylie Blacklock 01/29/2020,4:00 PM

## 2020-01-29 NOTE — Progress Notes (Signed)
Patient's HR decreased to 89 and is currently green mews. Will continue to recheck vitals every 4 hours until 1321.  Christene Slates

## 2020-01-29 NOTE — Progress Notes (Signed)
[facility] [place] NAME: Blake [name]    MR#:  [id number]  DATE OF BIRTH:  Aug 06, 1922  SUBJECTIVE:  Seen at  HD  Not interested to talk- -keeps eyes closed \  REVIEW OF SYSTEMS:   Review of Systems  Unable to perform ROS: Medical condition   Tolerating Diet:some  Tolerating PT: pending--unable to do due to groin HD  DRUG ALLERGIES:   Allergies  Allergen Reactions  . Cefepime     seizure  . Diovan [Valsartan] Cough  . Gabapentin Other (See Comments)    Sedation at all doses  . Hydralazine Itching  . Lisinopril Cough  . Pregabalin Other (See Comments)    Sedation at all doses  . Levofloxacin Other (See Comments)    Too many side effects. Nausea, vomiting, upset stomach, increased confusion, etc Other reaction(s): Confusion Nausea, chills Nausea, chills   . Sulfamethoxazole-Trimethoprim Other (See Comments)    Too many side effects. Nausea, vomiting, upset stomach, increased confusion, etc    VITALS:  Blood pressure (!) 120/98, pulse (!) 117, temperature (!) 96.6 F (35.9 C), temperature source Other (Comment), resp. rate (!) 27, height 6' (1.829 m), weight 96.1 kg, SpO2 98 %.  PHYSICAL EXAMINATION:   Physical Exam limited exam due to patient participation and decline in condition GENERAL:  86 y.o.-year-old patient lying in the bed with no acute distress.  Chronically ill and debilitated LUNGS: coarse breath sounds bilaterally, no wheezing, rales, rhonchi. No use of accessory muscles of respiration.  CARDIOVASCULAR: S1, S2 normal. No murmurs, rubs, or gallops.  ABDOMEN: Soft, nontender, nondistended. Bowel sounds present. No organomegaly or mass. Chronic Bilateral nephrostomy tubes+ EXTREMITIES: No cyanosis, clubbing , + edema b/l.   Right groin catheter+ placed 01/25/20 NEUROLOGIC: unable to assess. Patient quite weak and lethargic grossly nonfo PSYCHIATRIC:  patient is lethargic SKIN: Pressure Injury 11/21/18  Buttocks Bilateral;Left;Right Stage II -  Partial thickness loss of dermis presenting as a shallow open ulcer with a red, pink wound bed without slough. pink (Active)  11/21/18 2350  Location: Buttocks  Location Orientation: Bilateral;Left;Right  Staging: Stage II -  Partial thickness loss of dermis presenting as a shallow open ulcer with a red, pink wound bed without slough.  Wound Description (Comments): pink  Present on Admission: Yes     Pressure Injury 01/01/19 Buttocks Left Stage I -  Intact skin with non-blanchable redness of a localized area usually over a bony prominence. (Active)  01/01/19 0805  Location: Buttocks  Location Orientation: Left  Staging: Stage I -  Intact skin with non-blanchable redness of a localized area usually over a bony prominence.  Wound Description (Comments):   Present on Admission: Yes     Pressure Injury 09/25/19 Sacrum Stage 3 -  Full thickness tissue loss. Subcutaneous fat may be visible but bone, tendon or muscle are NOT exposed. 1cm x 1cm . Cleansed and Foam applied (Active)  09/25/19 0200  Location: Sacrum  Location Orientation:   Staging: Stage 3 -  Full thickness tissue loss. Subcutaneous fat may be visible but bone, tendon or muscle are NOT exposed.  Wound Description (Comments): 1cm x 1cm . Cleansed and Foam applied  Present on Admission: Yes     Pressure Injury 09/25/19 Heel Right Deep Tissue Pressure Injury - Purple or maroon localized area of discolored intact skin or blood-filled blister due to damage of underlying soft tissue from pressure and/or shear. Right heel is purple. Skin intact. Ele (Active)  09/25/19 0200  Location: Heel  Location Orientation: Right  Staging: Deep Tissue Pressure Injury - Purple or maroon localized area of discolored intact skin or blood-filled blister due to damage of underlying soft tissue from pressure and/or shear.  Wound Description (Comments): Right heel is purple. Skin intact. Elevated on 2 pillows.   Present on Admission: Yes     Pressure Injury 01/23/20 Sacrum Medial Stage 2 -  Partial thickness loss of dermis presenting as a shallow open injury with a red, pink wound bed without slough. st 2 to right cheek (Active)  01/23/20 1647  Location: Sacrum  Location Orientation: Medial  Staging: Stage 2 -  Partial thickness loss of dermis presenting as a shallow open injury with a red, pink wound bed without slough.  Wound Description (Comments): st 2 to right cheek  Present on Admission: Yes      LABORATORY PANEL:  CBC Recent Labs  Lab 01/29/20 1030  WBC 5.7  HGB 9.5*  HCT 29.4*  PLT 52*    Chemistries  Recent Labs  Lab 01/23/20 0949 01/24/20 0438 01/29/20 1030  NA 132*   < > 138  K 3.8   < > 3.7  CL 85*   < > 97*  CO2 27   < > 28  GLUCOSE 134*   < > 170*  BUN 106*   < > 67*  CREATININE 7.58*   < > 6.55*  CALCIUM 7.8*   < > 7.8*  AST 13*  --   --   ALT 9  --   --   ALKPHOS 70  --   --   BILITOT 0.9  --   --    < > = values in this interval not displayed.   Cardiac Enzymes No results for input(s): TROPONINI in the last 168 hours. RADIOLOGY:  No results found. ASSESSMENT AND PLAN:   Blake [name] is a 86 y.o. male with medical history significant of hypertension, hyperlipidemia, diabetes mellitus, GERD, OSA, prostate cancer, urothelial carcinoma of the bladder, s/p of bilateral nephrostomy, pacemaker placement due to SSS, GI bleeding, dCHF, SVT, CKD-IV, thrombocytopenia, chronic respiratory failure on 2 L nasal cannula oxygen at night at home, who presents with decreased urine output.  Acute renal failure superimposed on stage 4 chronic kidney disease (Blake [name]): Cre is up from 3.69 on 11/04/19 -->7.58, BUN 106. --upto 7.77-- HD--6.67 - Renal ultrasound showed no hydronephrosis. -Nephrology consultation with Dr. Arthor Captain-- appears renal function has deteriorated causing uremia/encephalopathy/poor urine output/overall decline -received IV fluids initially w/o any  change in crcl -hold torsemide -Avoid using renal toxic medications -7/16> Dter and pt wish to start HD. Right groin HD access by Dr Lucky Cowboy -7/17> Started HD--1st treatment 7/20> Dr Raliegh Ip to speak with dter regarding permanent access  Abnormal Urine--appears colonization with Chronic bilateral nephrostomy tubes -Was on IV aztreonam--will d/c it. No fever, wbc normal -blood culture negative - urine culture 70K Staph epidermidis  SVT (supraventricular tachycardia) ([facility]) -Metoprolol  HLD (hyperlipidemia) -Crestor  Urothelial carcinoma of bladder and hx of prostate cancer -s/p chronic bilateral nephrostomy for ~1year -Proscar and Flomax  Acute respiratory failure with hypoxia/acute on chornic CHF: Likely due to fluid overload.  Patient has 2+ leg edema -As needed albuterol -Nasal cannula oxygen to maintain oxygen saturation above 93% -now on HD  Acute on Chronic diastolic CHF (congestive heart failure) ([place]): 2D echo on 11/22/2018 showed EF 55-60%.  Patient has elevated BNP 2109, 2+ leg edema, indicating fluid overload.  Since patient has  worsening renal function, will hold diuretics -Hold torsemide -Nasal cannula oxygen to maintain oxygen saturation above 93% -trial of UF at HD  HTN (hypertension) -As needed hydralazine -Metoprolol  Type II diabetes mellitus with renal manifestations [facility]): Recent A1c 7.5, patient is taking Januvia -SSI  Normocytic anemia due to Chronic disease - Hemoglobin 9.9 which was a 10.9 on 4/20/4/21.  No active bleeding tendency.  Chronic decubitus pressure ulcers present on admission -follow-up wound care   DVT ppx: SCD Code Status: Full code per dter Dr crisp on admission Family Communication:  Dr Raliegh Ip has been communicating with family (dter-Dr Primus Bravo) Disposition Plan:  Anticipate discharge back to previous environment  Consults called:  Nephrology Admission status: Med-surg bed as inpt     Status is: Inpatient  Dispo: The patient is  from: Home  Anticipated d/c is to: Home  Anticipated d/c date is: TBD  Patient currently is not medically stable to d/c. Pt now started on HD. Discharge pending how pt is tolerating further HD treatments pt will need permanent access also. Dr Raliegh Ip to determine it.   TOTAL TIME TAKING CARE OF THIS PATIENT: *20* minutes.  >50% time spent on counselling and coordination of care  Note: This dictation was prepared with Dragon dictation along with smaller phrase technology. Any transcriptional errors that result from this process are unintentional.  Fritzi Mandes M.D    [facility]   CC: Primary care physician; Adin Hector, MDPatient ID: Blake [name], male   DOB: [birth year], 86 y.o.   MRN: [id number]

## 2020-01-29 NOTE — Progress Notes (Signed)
   01/29/20 2008  Vitals  Temp 98.4 F (36.9 C)  Temp Source Oral  BP (!) 124/96  MAP (mmHg) 105  BP Location Left Arm  BP Method Automatic  Patient Position (if appropriate) Lying  Pulse Rate (!) 131  Pulse Rate Source Dinamap  Resp (!) 24  MEWS COLOR  MEWS Score Color Red  Oxygen Therapy  SpO2 98 %  O2 Device Nasal Cannula  Pain Assessment  Pain Scale 0-10  Pain Score 0  MEWS Score  MEWS Temp 0  MEWS Systolic 0  MEWS Pulse 3  MEWS RR 1  MEWS LOC 0  MEWS Score 4  Provider Notification  Provider Name/Title Sharion Settler  Date Provider Notified 01/29/20  Time Provider Notified 2050  Notification Type  (text)  Notification Reason Other (Comment) (HR elevated for several hours)  Response See new orders (metoprolol 10mg  IV once)  Date of Provider Response 01/29/20  Time of Provider Response 2054

## 2020-01-29 NOTE — Progress Notes (Signed)
   01/29/20 1744  Assess: MEWS Score  Temp 98.5 F (36.9 C)  BP (!) 150/96  Pulse Rate (!) 130  Resp (!) 24  SpO2 98 %  O2 Device Nasal Cannula  O2 Flow Rate (L/min) 3 L/min  Assess: MEWS Score  MEWS Temp 0  MEWS Systolic 0  MEWS Pulse 3  MEWS RR 1  MEWS LOC 0  MEWS Score 4  MEWS Score Color Red  Assess: if the MEWS score is Yellow or Red  Were vital signs taken at a resting state? Yes  Focused Assessment No change from prior assessment  Early Detection of Sepsis Score *See Row Information* Medium  MEWS guidelines implemented *See Row Information* Yes  Treat  Pain Scale 0-10  Pain Score 0  Take Vital Signs  Increase Vital Sign Frequency  Red: Q 1hr X 4 then Q 4hr X 4, if remains red, continue Q 4hrs  Escalate  MEWS: Escalate Red: discuss with charge nurse/RN and provider, consider discussing with RRT  Notify: Charge Nurse/RN  Name of Charge Nurse/RN Notified Arshdeep Bolger RN   Date Charge Nurse/RN Notified 01/29/20  Time Charge Nurse/RN Notified [id number]  Notify: Provider  Provider Name/Title Dr. Posey Pronto  Date Provider Notified 01/29/20  Time Provider Notified 1750  Notification Reason Change in status  Response See new orders  Date of Provider Response 01/29/20  Time of Provider Response [id number]  Notify: Rapid Response  Name of Rapid Response RN Notified Sarah RN   Date Rapid Response Notified 01/29/20  Time Rapid Response Notified 1802  Document  Progress note created (see row info) Yes

## 2020-01-29 NOTE — Progress Notes (Signed)
[facility]  ROUNDING NOTE   Subjective:   Second hemodialysis treatment yesterday. Tolerated treatment well. Patient eating breakfast this morning. Not able to answer questions.    Objective:  Vital signs in last 24 hours:  Temp:  [97 F (36.1 C)-98.2 F (36.8 C)] 98.1 F (36.7 C) (07/20 0459) Pulse Rate:  [89-129] 89 (07/20 0459) Resp:  [18-25] 24 (07/20 0459) BP: (114-152)/(78-104) 114/86 (07/20 0459) SpO2:  [87 %-100 %] 100 % (07/20 0459) Weight:  [96.1 kg] 96.1 kg (07/20 0429)  Weight change: 0.4 kg Filed Weights   01/26/20 0840 01/28/20 0429 01/29/20 0429  Weight: 90.1 kg 95.7 kg 96.1 kg    Intake/Output: I/O last 3 completed shifts: In: 613 [P.O.:433; Other:180] Out: 310 [Urine:310]   Intake/Output this shift:  No intake/output data recorded.  Physical Exam: General: NAD, laying in bed  Head: Normocephalic, atraumatic. Moist oral mucosal membranes  Eyes: Anicteric,   Neck: Supple, trachea midline  Lungs:  +rhonchi bilaterally   Heart: irregular   Abdomen:  Soft, nontender,   Extremities:  + peripheral edema.  Neurologic: Moving al four extremities.   Skin: No lesions  Access: Left femoral dialysis catheter 7/16 Dr. Lucky Cowboy    Basic Metabolic Panel: Recent Labs  Lab 01/24/20 0438 01/24/20 0438 01/25/20 0457 01/25/20 0457 01/26/20 0807 01/27/20 0451 01/28/20 1017 01/28/20 1520  NA 132*  --  132*  --   --  136 135 135  K 3.8  --  4.1  --   --  4.0 3.8 3.5  CL 89*  --  89*  --   --  95* 93* 96*  CO2 27  --  27  --   --  29 26 25   GLUCOSE 117*  --  101*  --   --  93 97 132*  BUN 102*  --  100*  --   --  95* 103* 66*  CREATININE 7.42*  --  7.77*  --   --  7.29* 7.92* 5.88*  CALCIUM 7.7*   < > 7.6*   < >  --  7.6* 7.8* 7.7*  PHOS  --   --   --   --  8.6* 7.8* 8.3*  --    < > = values in this interval not displayed.    Liver Function Tests: Recent Labs  Lab 01/23/20 0949 01/27/20 0451 01/28/20 1017  AST 13*  --   --   ALT 9  --    --   ALKPHOS 70  --   --   BILITOT 0.9  --   --   PROT 7.2  --   --   ALBUMIN 3.0* 2.7* 2.7*   No results for input(s): LIPASE, AMYLASE in the last 168 hours. No results for input(s): AMMONIA in the last 168 hours.  CBC: Recent Labs  Lab 01/23/20 0949 01/24/20 0438 01/27/20 0451 01/28/20 1017  WBC 5.6 5.4 4.6 5.1  NEUTROABS 4.5  --   --   --   HGB 9.9* 9.9* 9.2* 9.7*  HCT 30.0* 29.8* 28.3* 29.7*  MCV 87.5 86.1 87.9 87.9  PLT 78* 70* 60* 52*    Cardiac Enzymes: No results for input(s): CKTOTAL, CKMB, CKMBINDEX, TROPONINI in the last 168 hours.  BNP: Invalid input(s): POCBNP  CBG: Recent Labs  Lab 01/28/20 0751 01/28/20 1434 01/28/20 1655 01/28/20 2149 01/29/20 0744  GLUCAP 77 124* 118* 195* 121*    Microbiology: Results for orders placed or performed during the hospital encounter of  01/23/20  Urine culture     Status: Abnormal   Collection Time: 01/23/20  9:40 AM   Specimen: Urine, Random  Result Value Ref Range Status   Specimen Description   Final    URINE, RANDOM Performed at [facility], [address] [place], [place] [address]    Special Requests   Final    NONE Performed at [facility], [place], [place] [address]    Culture 70,000 COLONIES/mL STAPHYLOCOCCUS EPIDERMIDIS (A)  Final   Report Status 01/26/2020 FINAL  Final   Organism ID, Bacteria STAPHYLOCOCCUS EPIDERMIDIS (A)  Final      Susceptibility   Staphylococcus epidermidis - MIC*    CIPROFLOXACIN >=8 RESISTANT Resistant     GENTAMICIN <=0.5 SENSITIVE Sensitive     NITROFURANTOIN <=16 SENSITIVE Sensitive     OXACILLIN >=4 RESISTANT Resistant     TETRACYCLINE 2 SENSITIVE Sensitive     VANCOMYCIN 1 SENSITIVE Sensitive     TRIMETH/SULFA 80 RESISTANT Resistant     CLINDAMYCIN <=0.25 SENSITIVE Sensitive     RIFAMPIN <=0.5 SENSITIVE Sensitive     Inducible Clindamycin NEGATIVE Sensitive     * 70,000 COLONIES/mL STAPHYLOCOCCUS EPIDERMIDIS  SARS  Coronavirus 2 by RT PCR (hospital order, performed in [place] hospital lab) Nasopharyngeal Nasopharyngeal Swab     Status: None   Collection Time: 01/23/20  4:00 PM   Specimen: Nasopharyngeal Swab  Result Value Ref Range Status   SARS Coronavirus 2 NEGATIVE NEGATIVE Final    Comment: (NOTE) SARS-CoV-2 target nucleic acids are NOT DETECTED.  The SARS-CoV-2 RNA is generally detectable in upper and lower respiratory specimens during the acute phase of infection. The lowest concentration of SARS-CoV-2 viral copies this assay can detect is 250 copies / mL. A negative result does not preclude SARS-CoV-2 infection and should not be used as the sole basis for treatment or other patient management decisions.  A negative result may occur with improper specimen collection / handling, submission of specimen other than nasopharyngeal swab, presence of viral mutation(s) within the areas targeted by this assay, and inadequate number of viral copies (<250 copies / mL). A negative result must be combined with clinical observations, patient history, and epidemiological information.  Fact Sheet for Patients:   StrictlyIdeas.no  Fact Sheet for Healthcare Providers: BankingDealers.co.za  This test is not yet approved or  cleared by the [place] FDA and has been authorized for detection and/or diagnosis of SARS-CoV-2 by FDA under an Emergency Use Authorization (EUA).  This EUA will remain in effect (meaning this test can be used) for the duration of the COVID-19 declaration under Section 564(b)(1) of the Act, 21 U.S.C. section 360bbb-3(b)(1), unless the authorization is terminated or revoked sooner.  Performed at [facility], [address] [place], [place] [address]   CULTURE, BLOOD (ROUTINE X 2) w Reflex to ID Panel     Status: None   Collection Time: 01/23/20  5:13 PM   Specimen: BLOOD  Result Value Ref Range Status   Specimen  Description BLOOD BLOOD LEFT HAND  Final   Special Requests   Final    BOTTLES DRAWN AEROBIC AND ANAEROBIC Blood Culture adequate volume   Culture   Final    NO GROWTH 5 DAYS Performed at [facility], [address] [place],  [address]    Report Status 01/28/2020 FINAL  Final  CULTURE, BLOOD (ROUTINE X 2) w Reflex to ID Panel     Status: None  Collection Time: 01/23/20  6:12 PM   Specimen: BLOOD  Result Value Ref Range Status   Specimen Description BLOOD BLOOD LEFT WRIST  Final   Special Requests Blood Culture adequate volume  Final   Culture   Final    NO GROWTH 5 DAYS Performed at [facility], [address] [place], [place] [address]    Report Status 01/28/2020 FINAL  Final  MRSA PCR Screening     Status: Abnormal   Collection Time: 01/25/20  8:17 AM   Specimen: Nasopharyngeal  Result Value Ref Range Status   MRSA by PCR POSITIVE (A) NEGATIVE Final    Comment:        The GeneXpert MRSA Assay (FDA approved for NASAL specimens only), is one component of a comprehensive MRSA colonization surveillance program. It is not intended to diagnose MRSA infection nor to guide or monitor treatment for MRSA infections. RESULT CALLED TO, READ BACK BY AND VERIFIED WITH: MARIA [facility] 01/25/20 1945 SJL Performed at [facility], [address] [place], [place] [address]     Coagulation Studies: No results for input(s): LABPROT, INR in the last 72 hours.  Urinalysis: No results for input(s): COLORURINE, LABSPEC, PHURINE, GLUCOSEU, HGBUR, BILIRUBINUR, KETONESUR, PROTEINUR, UROBILINOGEN, NITRITE, LEUKOCYTESUR in the last 72 hours.  Invalid input(s): APPERANCEUR    Imaging: No results found.   Medications:   . sodium chloride    . sodium chloride     . ferrous sulfate  325 mg Oral Q breakfast   And  . vitamin C  250 mg Oral Q breakfast  . azelastine  1 spray Each Nare BID  . Chlorhexidine Gluconate Cloth  6 each Topical Daily  .  Chlorhexidine Gluconate Cloth  6 each Topical Q0600  . epoetin (EPOGEN/PROCRIT) injection  10,000 Units Intravenous Q T,Th,Sa-HD  . feeding supplement (NEPRO CARB STEADY)  237 mL Oral BID BM  . finasteride  5 mg Oral Daily  . insulin aspart  0-5 Units Subcutaneous QHS  . insulin aspart  0-9 Units Subcutaneous TID WC  . mouth rinse  15 mL Mouth Rinse BID  . metoprolol tartrate  37.5 mg Oral BID  . multivitamin-lutein  1 capsule Oral BID  . mupirocin ointment  1 application Nasal BID  . pantoprazole  40 mg Oral Daily  . rosuvastatin  5 mg Oral QHS  . sodium bicarbonate  650 mg Oral BID  . tamsulosin  0.4 mg Oral Daily   sodium chloride, sodium chloride, acetaminophen, albuterol, alteplase, heparin, lidocaine (PF), lidocaine-prilocaine, ondansetron (ZOFRAN) IV, pentafluoroprop-tetrafluoroeth  Assessment/ Plan:  Blake [name] is a 86 y.o. black male with acute renal failure on hemodialysis, hypertension, hyperlipidemia, diabetes mellitus type 2, GERD, obstructive sleep apnea, urothelial carcinoma of the bladder status post bilateral nephrostomy tube placement, permanent pacemaker placement secondary to sick sinus syndrome, SVT, who is admitted to [facility] on 01/23/2020 for Weakness [R53.1] SOB (shortness of breath) [R06.02] Acute renal failure superimposed on stage 4 chronic kidney disease ([place]) [N17.9, N18.4] Acute renal failure superimposed on chronic kidney disease, unspecified CKD stage, unspecified acute renal failure type ([place]) [N17.9, N18.9] Patient initiated on hemodialysis on 7/17 for uremic symptoms.   1. Acute kidney injury on chronic kidney disease stage IV secondary to obstructive uropathy from urothelial carcinoma status post bilateral nephrostomy tube placement. Seen and examined on hemodialysis. Clinically looking better.  - Third dialysis treatment for later today. Patient to be seated in chair - if family is in agreement, will place a  tunneled catheter.  - Will discuss  with family later today.  - Continue sodium bicarbonate.   2. Anemia of chronic kidney disease. Hemoglobin 9.7   - EPO with TTS treatments.   3. Hypertension - metoprolol     LOS: 6 Merrit Friesen 7/20/202110:16 AM

## 2020-01-30 ENCOUNTER — Inpatient Hospital Stay: Payer: Medicare PPO

## 2020-01-30 ENCOUNTER — Other Ambulatory Visit (INDEPENDENT_AMBULATORY_CARE_PROVIDER_SITE_OTHER): Payer: Self-pay | Admitting: Vascular Surgery

## 2020-01-30 DIAGNOSIS — I471 Supraventricular tachycardia: Secondary | ICD-10-CM | POA: Diagnosis not present

## 2020-01-30 DIAGNOSIS — N179 Acute kidney failure, unspecified: Secondary | ICD-10-CM | POA: Diagnosis not present

## 2020-01-30 DIAGNOSIS — I5033 Acute on chronic diastolic (congestive) heart failure: Secondary | ICD-10-CM

## 2020-01-30 DIAGNOSIS — J90 Pleural effusion, not elsewhere classified: Secondary | ICD-10-CM

## 2020-01-30 DIAGNOSIS — L89302 Pressure ulcer of unspecified buttock, stage 2: Secondary | ICD-10-CM

## 2020-01-30 DIAGNOSIS — E43 Unspecified severe protein-calorie malnutrition: Secondary | ICD-10-CM | POA: Insufficient documentation

## 2020-01-30 DIAGNOSIS — J9601 Acute respiratory failure with hypoxia: Secondary | ICD-10-CM | POA: Diagnosis not present

## 2020-01-30 LAB — PROTEIN, PLEURAL OR PERITONEAL FLUID: Total protein, fluid: <3 g/dL

## 2020-01-30 LAB — BODY FLUID CELL COUNT WITH DIFFERENTIAL
Eos, Fluid: 0 %
Lymphs, Fluid: 21 %
Monocyte-Macrophage-Serous Fluid: 40 %
Neutrophil Count, Fluid: 38 %
Total Nucleated Cell Count, Fluid: 341 cu mm

## 2020-01-30 LAB — GLUCOSE, CAPILLARY
Glucose-Capillary: 109 mg/dL — ABNORMAL HIGH (ref 70–99)
Glucose-Capillary: 119 mg/dL — ABNORMAL HIGH (ref 70–99)
Glucose-Capillary: 101 mg/dL — ABNORMAL HIGH (ref 70–99)
Glucose-Capillary: 134 mg/dL — ABNORMAL HIGH (ref 70–99)

## 2020-01-30 LAB — LACTATE DEHYDROGENASE, PLEURAL OR PERITONEAL FLUID: LD, Fluid: 35 U/L — ABNORMAL HIGH (ref 3–23)

## 2020-01-30 MED ORDER — ENSURE ENLIVE PO LIQD
237.0000 mL | Freq: Two times a day (BID) | ORAL | Status: DC
  Administered 2020-02-01 – 2020-02-06 (×9): 237 mL via ORAL

## 2020-01-30 MED ORDER — RENA-VITE PO TABS
1.0000 | ORAL_TABLET | Freq: Every day | ORAL | Status: DC
  Administered 2020-01-30 – 2020-02-10 (×12): 1 via ORAL
  Filled 2020-01-30 (×12): qty 1

## 2020-01-30 NOTE — Procedures (Signed)
Interventional Radiology Procedure Note  Procedure: US guided right thoracentesis. Complications: None Recommendations:  - CXR now - Do not submerge for 7 days - Routine wound care   Signed,  Dulcy Fanny. Earleen Newport, DO

## 2020-01-30 NOTE — Progress Notes (Signed)
Nutrition Follow Up Note   DOCUMENTATION CODES:   Severe malnutrition in context of chronic illness  INTERVENTION:   Ensure Enlive po BID, each supplement provides 350 kcal and 20 grams of protein  Vitamin C '250mg'$  po daily   Rena-vite daily   NUTRITION DIAGNOSIS:   Severe Malnutrition related to cancer and cancer related treatments as evidenced by moderate fat depletion, severe fat depletion, moderate muscle depletion, severe muscle depletion.  GOAL:   Patient will meet greater than or equal to 90% of their needs  -progressing   MONITOR:   PO intake, Supplement acceptance, Labs, Weight trends, Skin, I & O's  ASSESSMENT:   86 y.o. male with medical history significant of hypertension, hyperlipidemia, diabetes mellitus, GERD, OSA, prostate cancer, urothelial carcinoma of the bladder, s/p of bilateral nephrostomy, pacemaker placement due to SSS, GI bleeding, dCHF, SVT, CKD-IV, thrombocytopenia, chronic respiratory failure on 2 L nasal cannula oxygen at night at home, who presents with decreased urine output.   Met with pt in room today. Pt sleeping at time of RD visit. Pt's son at bedside. Per chart review, pt with improved appetite and oral intake over the past 2 days; pt documented to be eating 100% of meals. Pt does not like the Nepro supplements; pt drinks chocolate Glucerna at home. RD previously discontinued Glucerna as this is high in potassium. Pt s/p HD initiation 7/17; potassium now wnl. RD will change supplement over to Ensure as this comes in chocolate and will provide more calories and protein. Pt may have Glucerna if he prefers. Pt seen by SLP today and placed on a dysphagia 1/thin liquid diet. RD will continue to monitor intakes. Per chart, pt is weight stable since admit.   Medications reviewed and include: ferrous sulfate, vitamin C, epogen, insulin, protonix, Na bicarbonate   Labs reviewed: BUN 67(H), creat 6.55(H), P 5.9(H) BNP- 4446.4(H) Hgb 9.5(L), Hct  29.4(L) cbgs- 121, 104, 175, 109 x 24 hrs  Diet Order:   Diet Order            DIET - DYS 1 Room service appropriate? Yes; Fluid consistency: Thin  Diet effective now                EDUCATION NEEDS:   No education needs have been identified at this time  Skin:  Skin Assessment: Reviewed RN Assessment (Stage II sacrum, ecchymosis)  Last BM:  7/21- type 6  Height:   Ht Readings from Last 1 Encounters:  01/23/20 6' (1.829 m)    Weight:   Wt Readings from Last 1 Encounters:  01/30/20 97 kg    Ideal Body Weight:  80.9 kg  BMI:  Body mass index is 29 kg/m.  Estimated Nutritional Needs:   Kcal:  2000-2300kcal/day  Protein:  85-100g/day  Fluid:  2L/day or per MD  Koleen Distance MS, RD, LDN Please refer to [facility] for RD and/or RD on-call/weekend/after hours pager

## 2020-01-30 NOTE — Progress Notes (Signed)
Patient ID: Blake [name], male   DOB: [birth year], 86 y.o.   MRN: [id number] [facility] PROGRESS NOTE  Blake [name] GQQ:[id number] DOB: Feb 04, 1923 DOA: 01/23/2020 PCP: Adin Hector, MD  HPI/Subjective: Patient seen this morning.  He had some gurgling in his upper chest.  He answered a few yes or no questions with shaking his head.  Patient came in with decreased urine output and found to have acute on chronic kidney disease stage IV.  Objective: Vitals:   01/30/20 1450 01/30/20 1543  BP: 101/75 117/84  Pulse: (!) 129 (!) 128  Resp:    Temp:  97.7 F (36.5 C)  SpO2: 100% 100%    Intake/Output Summary (Last 24 hours) at 01/30/2020 1544 Last data filed at 01/30/2020 0531 Gross per 24 hour  Intake 300 ml  Output 220 ml  Net 80 ml   Filed Weights   01/28/20 0429 01/29/20 0429 01/30/20 0500  Weight: 95.7 kg 96.1 kg 97 kg    ROS: Review of Systems  Unable to perform ROS: Acuity of condition  Respiratory: Positive for shortness of breath.   Cardiovascular: Negative for chest pain.  Gastrointestinal: Negative for abdominal pain.   Exam: Physical Exam HENT:     Nose: No mucosal edema.     Mouth/Throat:     Pharynx: No oropharyngeal exudate.  Eyes:     General: Lids are normal.     Conjunctiva/sclera: Conjunctivae normal.     Pupils: Pupils are equal, round, and reactive to light.  Neck:     Thyroid: No thyroid mass or thyromegaly.     Vascular: No carotid bruit or JVD.  Cardiovascular:     Rate and Rhythm: Regular rhythm. Tachycardia present.     Heart sounds: Normal heart sounds, S1 normal and S2 normal.  Pulmonary:     Breath sounds: Decreased air movement present. Examination of the right-middle field reveals decreased breath sounds and rhonchi. Examination of the left-middle field reveals decreased breath sounds and rhonchi. Examination of the right-lower field reveals decreased breath sounds and rhonchi. Examination of the left-lower field reveals decreased  breath sounds and rhonchi. Decreased breath sounds and rhonchi present. No wheezing or rales.  Abdominal:     Palpations: Abdomen is soft.     Tenderness: There is no abdominal tenderness.  Musculoskeletal:     Cervical back: No edema.     Right ankle: No swelling.     Left ankle: No swelling.  Lymphadenopathy:     Cervical: No cervical adenopathy.  Skin:    General: Skin is warm.     Findings: No rash.  Neurological:     Mental Status: He is lethargic.     Comments: Patient unable to straight leg raise.       Data Reviewed: Basic Metabolic Panel: Recent Labs  Lab 01/25/20 0457 01/26/20 0807 01/27/20 0451 01/28/20 1017 01/28/20 1520 01/29/20 1030  NA 132*  --  136 135 135 138  K 4.1  --  4.0 3.8 3.5 3.7  CL 89*  --  95* 93* 96* 97*  CO2 27  --  29 26 25 28   GLUCOSE 101*  --  93 97 132* 170*  BUN 100*  --  95* 103* 66* 67*  CREATININE 7.77*  --  7.29* 7.92* 5.88* 6.55*  CALCIUM 7.6*  --  7.6* 7.8* 7.7* 7.8*  MG  --   --   --   --   --  2.4  PHOS  --  8.6* 7.8* 8.3*  --  5.9*   Liver Function Tests: Recent Labs  Lab 01/27/20 0451 01/28/20 1017 01/29/20 1030  ALBUMIN 2.7* 2.7* 2.7*   CBC: Recent Labs  Lab 01/24/20 0438 01/27/20 0451 01/28/20 1017 01/29/20 1030  WBC 5.4 4.6 5.1 5.7  HGB 9.9* 9.2* 9.7* 9.5*  HCT 29.8* 28.3* 29.7* 29.4*  MCV 86.1 87.9 87.9 88.6  PLT 70* 60* 52* 52*   BNP (last 3 results) Recent Labs    09/24/19 1530 01/23/20 0948 01/29/20 1046  BNP 982.0* 2,109.0* 4,446.4*     CBG: Recent Labs  Lab 01/29/20 0744 01/29/20 1647 01/29/20 2112 01/30/20 0745 01/30/20 1125  GLUCAP 121* 104* 175* 109* 119*    Recent Results (from the past 240 hour(s))  Urine culture     Status: Abnormal   Collection Time: 01/23/20  9:40 AM   Specimen: Urine, Random  Result Value Ref Range Status   Specimen Description   Final    URINE, RANDOM Performed at [facility], [address] [place], [place] [address]    Special  Requests   Final    NONE Performed at [facility], [place], [place] [address]    Culture 70,000 COLONIES/mL STAPHYLOCOCCUS EPIDERMIDIS (A)  Final   Report Status 01/26/2020 FINAL  Final   Organism ID, Bacteria STAPHYLOCOCCUS EPIDERMIDIS (A)  Final      Susceptibility   Staphylococcus epidermidis - MIC*    CIPROFLOXACIN >=8 RESISTANT Resistant     GENTAMICIN <=0.5 SENSITIVE Sensitive     NITROFURANTOIN <=16 SENSITIVE Sensitive     OXACILLIN >=4 RESISTANT Resistant     TETRACYCLINE 2 SENSITIVE Sensitive     VANCOMYCIN 1 SENSITIVE Sensitive     TRIMETH/SULFA 80 RESISTANT Resistant     CLINDAMYCIN <=0.25 SENSITIVE Sensitive     RIFAMPIN <=0.5 SENSITIVE Sensitive     Inducible Clindamycin NEGATIVE Sensitive     * 70,000 COLONIES/mL STAPHYLOCOCCUS EPIDERMIDIS  SARS Coronavirus 2 by RT PCR (hospital order, performed in [place] hospital lab) Nasopharyngeal Nasopharyngeal Swab     Status: None   Collection Time: 01/23/20  4:00 PM   Specimen: Nasopharyngeal Swab  Result Value Ref Range Status   SARS Coronavirus 2 NEGATIVE NEGATIVE Final    Comment: (NOTE) SARS-CoV-2 target nucleic acids are NOT DETECTED.  The SARS-CoV-2 RNA is generally detectable in upper and lower respiratory specimens during the acute phase of infection. The lowest concentration of SARS-CoV-2 viral copies this assay can detect is 250 copies / mL. A negative result does not preclude SARS-CoV-2 infection and should not be used as the sole basis for treatment or other patient management decisions.  A negative result may occur with improper specimen collection / handling, submission of specimen other than nasopharyngeal swab, presence of viral mutation(s) within the areas targeted by this assay, and inadequate number of viral copies (<250 copies / mL). A negative result must be combined with clinical observations, patient history, and epidemiological information.  Fact Sheet for Patients:    StrictlyIdeas.no  Fact Sheet for Healthcare Providers: BankingDealers.co.za  This test is not yet approved or  cleared by the [place] FDA and has been authorized for detection and/or diagnosis of SARS-CoV-2 by FDA under an Emergency Use Authorization (EUA).  This EUA will remain in effect (meaning this test can be used) for the duration of the COVID-19 declaration under Section 564(b)(1) of the Act, 21 U.S.C. section 360bbb-3(b)(1), unless the authorization is terminated or revoked sooner.  Performed at [facility], [address] [place], [place] [address]   CULTURE, BLOOD (ROUTINE X 2) w Reflex to ID Panel     Status: None   Collection Time: 01/23/20  5:13 PM   Specimen: BLOOD  Result Value Ref Range Status   Specimen Description BLOOD BLOOD LEFT HAND  Final   Special Requests   Final    BOTTLES DRAWN AEROBIC AND ANAEROBIC Blood Culture adequate volume   Culture   Final    NO GROWTH 5 DAYS Performed at [facility], [address] [place], [place] [address]    Report Status 01/28/2020 FINAL  Final  CULTURE, BLOOD (ROUTINE X 2) w Reflex to ID Panel     Status: None   Collection Time: 01/23/20  6:12 PM   Specimen: BLOOD  Result Value Ref Range Status   Specimen Description BLOOD BLOOD LEFT WRIST  Final   Special Requests Blood Culture adequate volume  Final   Culture   Final    NO GROWTH 5 DAYS Performed at [facility], [address] [place], [place] [address]    Report Status 01/28/2020 FINAL  Final  MRSA PCR Screening     Status: Abnormal   Collection Time: 01/25/20  8:17 AM   Specimen: Nasopharyngeal  Result Value Ref Range Status   MRSA by PCR POSITIVE (A) NEGATIVE Final    Comment:        The GeneXpert MRSA Assay (FDA approved for NASAL specimens only), is one component of a comprehensive MRSA colonization surveillance program. It is not intended to diagnose MRSA infection  nor to guide or monitor treatment for MRSA infections. RESULT CALLED TO, READ BACK BY AND VERIFIED WITH: MARIA [facility] 01/25/20 1945 SJL Performed at [facility], [address] [place], [place] [address]      Studies: DG Chest 1 View  Result Date: 01/29/2020 CLINICAL DATA:  86 year old male with tachycardia. EXAM: CHEST  1 VIEW COMPARISON:  Chest radiograph dated 01/23/2020. FINDINGS: There is near complete opacification the right hemithorax consistent with a large pleural effusion with associated near complete compressive atelectasis of the right lung. Pneumonia or underlying mass is not excluded. Clinical correlation and follow-up to resolution recommended. Overall significant interval progression of the right lung opacity compared to the radiograph of 01/23/2020. There is only a small aerated portion of the right lung. Interval increase in the size of the left pleural effusion with increase in the left lung base atelectasis or infiltrate. No pneumothorax. Stable cardiomegaly. Atherosclerotic calcification of the aorta. Left pectoral pacemaker device. Degenerative changes of the spine. No acute osseous pathology. IMPRESSION: 1. Large right pleural effusion with near complete compressive atelectasis of the right lung. Overall significant progression of right lung opacity since the prior radiograph. Pneumonia or underlying mass is not excluded. Clinical correlation and follow-up to resolution recommended. 2. Interval increase in the size of the left pleural effusion and left lung base atelectasis or infiltrate. Electronically Signed   By: Anner Crete M.D.   On: 01/29/2020 21:05   US THORACENTESIS ASP PLEURAL SPACE W/IMG GUIDE  Result Date: 01/30/2020 INDICATION: 86 year old male with a history of pleural effusion, referred for thoracentesis EXAM: ULTRASOUND GUIDED RIGHT THORACENTESIS MEDICATIONS: None. COMPLICATIONS: None PROCEDURE: An ultrasound guided thoracentesis was thoroughly  discussed with the patient and questions answered. The benefits, risks, alternatives and complications were also discussed. The patient understands and wishes to proceed with the procedure. Written consent was obtained. Ultrasound was performed to localize  and mark an adequate pocket of fluid in the right chest. The area was then prepped and draped in the normal sterile fashion. 1% Lidocaine was used for local anesthesia. Under ultrasound guidance a 8 Fr Safe-T-Centesis catheter was introduced. Thoracentesis was performed. The catheter was removed and a dressing applied. FINDINGS: A total of approximately 2400 cc of thin yellow fluid was removed. Samples were sent to the laboratory as requested by the clinical team. IMPRESSION: Status post ultrasound-guided right-sided thoracentesis. Electronically Signed   By: Corrie Mckusick D.O.   On: 01/30/2020 15:35    Scheduled Meds: . ferrous sulfate  325 mg Oral Q breakfast   And  . vitamin C  250 mg Oral Q breakfast  . azelastine  1 spray Each Nare BID  . Chlorhexidine Gluconate Cloth  6 each Topical Q0600  . epoetin (EPOGEN/PROCRIT) injection  10,000 Units Intravenous Q T,Th,Sa-HD  . feeding supplement (ENSURE ENLIVE)  237 mL Oral BID BM  . finasteride  5 mg Oral Daily  . insulin aspart  0-5 Units Subcutaneous QHS  . insulin aspart  0-9 Units Subcutaneous TID WC  . mouth rinse  15 mL Mouth Rinse BID  . metoprolol tartrate  37.5 mg Oral BID  . multivitamin  1 tablet Oral QHS  . mupirocin ointment  1 application Nasal BID  . pantoprazole  40 mg Oral Daily  . rosuvastatin  5 mg Oral QHS  . sodium bicarbonate  650 mg Oral BID  . tamsulosin  0.4 mg Oral Daily    Assessment/Plan:  1. Acute kidney injury on chronic kidney disease stage IV.  Nephrology started 3 days of dialysis through a groin catheter. Nephrology to speak with family about dialysis. 2. Bilateral large pleural effusions.  I spoke with the patient's daughter today about a thoracentesis.   Interventional radiology drew off 2400 mL.  3. SVT.  Continue metoprolol twice a day 4. History of urothelial carcinoma the bladder.  History of prostate cancer.  Status post bilateral nephrostomy tubes.  On Proscar and Flomax. 5. Acute hypoxic respiratory failure.  Continue oxygen supplementation.  Currently on 3 L. 6. Acute on chronic diastolic congestive heart failure.  BNP elevated and has bilateral pleural effusions.  Removed 2400 mL from right thoracentesis today 7. Type 2 diabetes mellitus with renal manifestations 8. Anemia of chronic disease 9. Chronic decubitus ulcer present on admission, stage II. 10. Reviewed patient's full CODE STATUS today.       Code Status:     Code Status Orders  (From admission, onward)         Start     Ordered   01/23/20 1517  Full code  Continuous        01/23/20 1516        Code Status History    Date Active Date Inactive Code Status Order ID Comments User Context   11/04/2019 0118 11/05/2019 2145 Full Code [id number]  Neena Rhymes, MD ED   11/04/2019 0054 11/04/2019 0118 DNR [id number]  Neena Rhymes, MD ED   09/24/2019 2132 09/28/2019 2309 Full Code [id number]  Orene Desanctis, DO ED   01/03/2019 0142 01/09/2019 2238 Full Code [id number]  Mansy, Arvella Merles, MD ED   12/25/2018 1233 01/02/2019 1950 Full Code [id number]  Dustin Flock, MD Inpatient   12/25/2018 0645 12/25/2018 1233 Full Code [id number]  Paulette Blanch, MD ED   11/21/2018 2217 11/29/2018 2052 Full Code [id number]  Mayer Camel, NP ED   11/05/2018 1755 11/09/2018  [place] Full Code [id number]  Sela Hua, MD Inpatient   09/10/2017 1430 09/13/2017 2115 Full Code [id number]  Idelle Crouch, MD Inpatient   06/10/2017 2304 06/12/2017 1908 Full Code [id number]  Lance Coon, MD Inpatient   05/17/2017 0829 05/19/2017 1525 Full Code [id number]  Hillary Bow, MD ED   03/31/2017 1334 04/03/2017 1611 Full Code [id number]  Nicholes Mango, MD Inpatient   10/21/2016 0155 10/25/2016 2145 Full Code [id number]   Harvie Bridge, DO Inpatient   10/09/2016 2342 10/13/2016 2009 Full Code [id number]  Vaughan Basta, MD Inpatient   04/14/2015 1432 04/15/2015 1739 Full Code [id number]  Isaias Cowman, MD Inpatient   04/09/2015 0022 04/14/2015 1432 Full Code [id number]  Hower, Aaron Mose, MD ED   Advance Care Planning Activity     Family Communication: Spoke with Dr. Primus Bravo (the patient's daughter) on the phone Disposition Plan: Status is: Inpatient  Dispo: The patient is from: Home              Anticipated d/c is to: Home              Anticipated d/c date is: Likely will end up needing more time here in the hospital              Patient currently treated with dialysis for worsening kidney function.  Received thoracentesis today for large right pleural effusion  Consultants:  Nephrology  Procedures:  Dialysis catheter  Thoracentesis  Time spent: 28 minutes, case discussed with nephrology and transitional care team  [facility]  [facility]

## 2020-01-30 NOTE — Progress Notes (Signed)
OT Cancellation Note  Patient Details Name: JUANMIGUEL DEFELICE MRN: [id number] DOB: 1923-06-23   Cancelled Treatment:    Reason Eval/Treat Not Completed: Medical issues which prohibited therapy. Pt continues to have temporary femoral catheter for HD. Contraindicated for ADL mobility. Also pending thoracentesis. Will continue to follow and evaluate as medically appropriate.   Jeni Salles, MPH, MS, OTR/L ascom [phone] 01/30/20, 11:06 AM

## 2020-01-30 NOTE — Progress Notes (Signed)
[facility]  ROUNDING NOTE   Subjective:   Third hemodialysis treatment yesterday. Seated in chair.   Tolerated treatment well. Son at bedside this morning.   Spoke to Daughter over the phone this afternoon.  Thoracentesis of right this afternoon.   Objective:  Vital signs in last 24 hours:  Temp:  [97.5 F (36.4 C)-98.6 F (37 C)] 98.4 F (36.9 C) (07/21 1005) Pulse Rate:  [63-131] 63 (07/21 1005) Resp:  [20-38] 22 (07/21 1005) BP: (100-150)/(73-107) 100/85 (07/21 1005) SpO2:  [94 %-100 %] 100 % (07/21 1005) Weight:  [97 kg] 97 kg (07/21 0500)  Weight change: 0.9 kg Filed Weights   01/28/20 0429 01/29/20 0429 01/30/20 0500  Weight: 95.7 kg 96.1 kg 97 kg    Intake/Output: I/O last 3 completed shifts: In: 520 [P.O.:520] Out: 360 [Urine:360]   Intake/Output this shift:  No intake/output data recorded.  Physical Exam: General: NAD, laying in bed  Head: Normocephalic, atraumatic. Moist oral mucosal membranes  Eyes: Anicteric,   Neck: Supple, trachea midline  Lungs:  +rhonchi bilaterally   Heart: irregular   Abdomen:  Soft, nontender,   Extremities:  + peripheral edema.  Neurologic: Moving al four extremities.   Skin: No lesions  Access: Left femoral dialysis catheter 7/16 Dr. Lucky Cowboy    Basic Metabolic Panel: Recent Labs  Lab 01/25/20 0457 01/25/20 0457 01/26/20 0807 01/27/20 0451 01/27/20 0451 01/28/20 1017 01/28/20 1520 01/29/20 1030  NA 132*  --   --  136  --  135 135 138  K 4.1  --   --  4.0  --  3.8 3.5 3.7  CL 89*  --   --  95*  --  93* 96* 97*  CO2 27  --   --  29  --  26 25 28   GLUCOSE 101*  --   --  93  --  97 132* 170*  BUN 100*  --   --  95*  --  103* 66* 67*  CREATININE 7.77*  --   --  7.29*  --  7.92* 5.88* 6.55*  CALCIUM 7.6*   < >  --  7.6*   < > 7.8* 7.7* 7.8*  MG  --   --   --   --   --   --   --  2.4  PHOS  --   --  8.6* 7.8*  --  8.3*  --  5.9*   < > = values in this interval not displayed.    Liver Function  Tests: Recent Labs  Lab 01/27/20 0451 01/28/20 1017 01/29/20 1030  ALBUMIN 2.7* 2.7* 2.7*   No results for input(s): LIPASE, AMYLASE in the last 168 hours. No results for input(s): AMMONIA in the last 168 hours.  CBC: Recent Labs  Lab 01/24/20 0438 01/27/20 0451 01/28/20 1017 01/29/20 1030  WBC 5.4 4.6 5.1 5.7  HGB 9.9* 9.2* 9.7* 9.5*  HCT 29.8* 28.3* 29.7* 29.4*  MCV 86.1 87.9 87.9 88.6  PLT 70* 60* 52* 52*    Cardiac Enzymes: No results for input(s): CKTOTAL, CKMB, CKMBINDEX, TROPONINI in the last 168 hours.  BNP: Invalid input(s): POCBNP  CBG: Recent Labs  Lab 01/29/20 0744 01/29/20 1647 01/29/20 2112 01/30/20 0745 01/30/20 1125  GLUCAP 121* 104* 175* 109* 119*    Microbiology: Results for orders placed or performed during the hospital encounter of 01/23/20  Urine culture     Status: Abnormal   Collection Time: 01/23/20  9:40 AM   Specimen:  Urine, Random  Result Value Ref Range Status   Specimen Description   Final    URINE, RANDOM Performed at [facility], [address] [place], [place] [address]    Special Requests   Final    NONE Performed at [facility], [place], [place] [address]    Culture 70,000 COLONIES/mL STAPHYLOCOCCUS EPIDERMIDIS (A)  Final   Report Status 01/26/2020 FINAL  Final   Organism ID, Bacteria STAPHYLOCOCCUS EPIDERMIDIS (A)  Final      Susceptibility   Staphylococcus epidermidis - MIC*    CIPROFLOXACIN >=8 RESISTANT Resistant     GENTAMICIN <=0.5 SENSITIVE Sensitive     NITROFURANTOIN <=16 SENSITIVE Sensitive     OXACILLIN >=4 RESISTANT Resistant     TETRACYCLINE 2 SENSITIVE Sensitive     VANCOMYCIN 1 SENSITIVE Sensitive     TRIMETH/SULFA 80 RESISTANT Resistant     CLINDAMYCIN <=0.25 SENSITIVE Sensitive     RIFAMPIN <=0.5 SENSITIVE Sensitive     Inducible Clindamycin NEGATIVE Sensitive     * 70,000 COLONIES/mL STAPHYLOCOCCUS EPIDERMIDIS  SARS Coronavirus 2 by RT PCR (hospital order,  performed in [place] hospital lab) Nasopharyngeal Nasopharyngeal Swab     Status: None   Collection Time: 01/23/20  4:00 PM   Specimen: Nasopharyngeal Swab  Result Value Ref Range Status   SARS Coronavirus 2 NEGATIVE NEGATIVE Final    Comment: (NOTE) SARS-CoV-2 target nucleic acids are NOT DETECTED.  The SARS-CoV-2 RNA is generally detectable in upper and lower respiratory specimens during the acute phase of infection. The lowest concentration of SARS-CoV-2 viral copies this assay can detect is 250 copies / mL. A negative result does not preclude SARS-CoV-2 infection and should not be used as the sole basis for treatment or other patient management decisions.  A negative result may occur with improper specimen collection / handling, submission of specimen other than nasopharyngeal swab, presence of viral mutation(s) within the areas targeted by this assay, and inadequate number of viral copies (<250 copies / mL). A negative result must be combined with clinical observations, patient history, and epidemiological information.  Fact Sheet for Patients:   StrictlyIdeas.no  Fact Sheet for Healthcare Providers: BankingDealers.co.za  This test is not yet approved or  cleared by the [place] FDA and has been authorized for detection and/or diagnosis of SARS-CoV-2 by FDA under an Emergency Use Authorization (EUA).  This EUA will remain in effect (meaning this test can be used) for the duration of the COVID-19 declaration under Section 564(b)(1) of the Act, 21 U.S.C. section 360bbb-3(b)(1), unless the authorization is terminated or revoked sooner.  Performed at [facility], [address] [place], [place] [address]   CULTURE, BLOOD (ROUTINE X 2) w Reflex to ID Panel     Status: None   Collection Time: 01/23/20  5:13 PM   Specimen: BLOOD  Result Value Ref Range Status   Specimen Description BLOOD BLOOD LEFT HAND  Final    Special Requests   Final    BOTTLES DRAWN AEROBIC AND ANAEROBIC Blood Culture adequate volume   Culture   Final    NO GROWTH 5 DAYS Performed at [facility], [address], [place], [place] [address]    Report Status 01/28/2020 FINAL  Final  CULTURE, BLOOD (ROUTINE X 2) w Reflex to ID Panel     Status: None   Collection Time: 01/23/20  6:12 PM   Specimen: BLOOD  Result Value Ref Range Status   Specimen Description  BLOOD BLOOD LEFT WRIST  Final   Special Requests Blood Culture adequate volume  Final   Culture   Final    NO GROWTH 5 DAYS Performed at [facility], [address] [place], [place] [address]    Report Status 01/28/2020 FINAL  Final  MRSA PCR Screening     Status: Abnormal   Collection Time: 01/25/20  8:17 AM   Specimen: Nasopharyngeal  Result Value Ref Range Status   MRSA by PCR POSITIVE (A) NEGATIVE Final    Comment:        The GeneXpert MRSA Assay (FDA approved for NASAL specimens only), is one component of a comprehensive MRSA colonization surveillance program. It is not intended to diagnose MRSA infection nor to guide or monitor treatment for MRSA infections. RESULT CALLED TO, READ BACK BY AND VERIFIED WITH: MARIA [facility] 01/25/20 1945 SJL Performed at [facility], [address] [place], [place] [address]     Coagulation Studies: No results for input(s): LABPROT, INR in the last 72 hours.  Urinalysis: No results for input(s): COLORURINE, LABSPEC, PHURINE, GLUCOSEU, HGBUR, BILIRUBINUR, KETONESUR, PROTEINUR, UROBILINOGEN, NITRITE, LEUKOCYTESUR in the last 72 hours.  Invalid input(s): APPERANCEUR    Imaging: DG Chest 1 View  Result Date: 01/29/2020 CLINICAL DATA:  86 year old male with tachycardia. EXAM: CHEST  1 VIEW COMPARISON:  Chest radiograph dated 01/23/2020. FINDINGS: There is near complete opacification the right hemithorax consistent with a large pleural effusion with associated near complete compressive  atelectasis of the right lung. Pneumonia or underlying mass is not excluded. Clinical correlation and follow-up to resolution recommended. Overall significant interval progression of the right lung opacity compared to the radiograph of 01/23/2020. There is only a small aerated portion of the right lung. Interval increase in the size of the left pleural effusion with increase in the left lung base atelectasis or infiltrate. No pneumothorax. Stable cardiomegaly. Atherosclerotic calcification of the aorta. Left pectoral pacemaker device. Degenerative changes of the spine. No acute osseous pathology. IMPRESSION: 1. Large right pleural effusion with near complete compressive atelectasis of the right lung. Overall significant progression of right lung opacity since the prior radiograph. Pneumonia or underlying mass is not excluded. Clinical correlation and follow-up to resolution recommended. 2. Interval increase in the size of the left pleural effusion and left lung base atelectasis or infiltrate. Electronically Signed   By: Anner Crete M.D.   On: 01/29/2020 21:05     Medications:    . ferrous sulfate  325 mg Oral Q breakfast   And  . vitamin C  250 mg Oral Q breakfast  . azelastine  1 spray Each Nare BID  . Chlorhexidine Gluconate Cloth  6 each Topical Q0600  . epoetin (EPOGEN/PROCRIT) injection  10,000 Units Intravenous Q T,Th,Sa-HD  . feeding supplement (ENSURE ENLIVE)  237 mL Oral BID BM  . finasteride  5 mg Oral Daily  . insulin aspart  0-5 Units Subcutaneous QHS  . insulin aspart  0-9 Units Subcutaneous TID WC  . mouth rinse  15 mL Mouth Rinse BID  . metoprolol tartrate  37.5 mg Oral BID  . multivitamin  1 tablet Oral QHS  . mupirocin ointment  1 application Nasal BID  . pantoprazole  40 mg Oral Daily  . rosuvastatin  5 mg Oral QHS  . sodium bicarbonate  650 mg Oral BID  . tamsulosin  0.4 mg Oral Daily   acetaminophen, albuterol, metoprolol tartrate, ondansetron (ZOFRAN)  IV  Assessment/ Plan:  [name]  is a 86 y.o. black male with acute renal failure on hemodialysis, hypertension, hyperlipidemia, diabetes mellitus type 2, GERD, obstructive sleep apnea, urothelial carcinoma of the bladder status post bilateral nephrostomy tube placement, permanent pacemaker placement secondary to sick sinus syndrome, SVT, who is admitted to [facility] on 01/23/2020 for Weakness [R53.1] SOB (shortness of breath) [R06.02] Acute renal failure superimposed on stage 4 chronic kidney disease (HCC) [N17.9, N18.4] Acute renal failure superimposed on chronic kidney disease, unspecified CKD stage, unspecified acute renal failure type ([place]) [N17.9, N18.9] Patient initiated on hemodialysis on 7/17 for uremic symptoms.   1. End Stage Renal Disease:  - if family is in agreement, will place a tunneled catheter tomorrow. - Continue sodium bicarbonate.   2. Anemia of chronic kidney disease.   - EPO with TTS treatments.   3. Hypertension - metoprolol   Consult palliative care.    LOS: 7 Hadlee Burback 7/21/20212:30 PM

## 2020-01-30 NOTE — Progress Notes (Addendum)
PT Cancellation Note  Patient Details Name: Blake [name] MRN: [id number] DOB: 1923/07/09   Cancelled Treatment:    Reason Eval/Treat Not Completed: Medical issues which prohibited therapy. Patient is contraindicated for mobility due to temporary femoral catheter for HD. Will continue to follow and evaluate when appropriate.     Ima Hafner 01/30/2020, 10:53 AM

## 2020-01-31 ENCOUNTER — Inpatient Hospital Stay: Payer: Medicare PPO

## 2020-01-31 ENCOUNTER — Encounter: Payer: Self-pay | Admitting: Internal Medicine

## 2020-01-31 ENCOUNTER — Encounter
Admission: EM | Disposition: A | Discharge status: Skilled Nursing Facility | Payer: Self-pay | Source: Home / Self Care | Attending: Internal Medicine

## 2020-01-31 DIAGNOSIS — I471 Supraventricular tachycardia: Secondary | ICD-10-CM | POA: Diagnosis not present

## 2020-01-31 DIAGNOSIS — Z515 Encounter for palliative care: Secondary | ICD-10-CM

## 2020-01-31 DIAGNOSIS — E43 Unspecified severe protein-calorie malnutrition: Secondary | ICD-10-CM | POA: Diagnosis not present

## 2020-01-31 DIAGNOSIS — Z7189 Other specified counseling: Secondary | ICD-10-CM | POA: Diagnosis not present

## 2020-01-31 DIAGNOSIS — E1122 Type 2 diabetes mellitus with diabetic chronic kidney disease: Secondary | ICD-10-CM

## 2020-01-31 DIAGNOSIS — T8383XA Hemorrhage of genitourinary prosthetic devices, implants and grafts, initial encounter: Secondary | ICD-10-CM

## 2020-01-31 DIAGNOSIS — N179 Acute kidney failure, unspecified: Secondary | ICD-10-CM | POA: Diagnosis not present

## 2020-01-31 DIAGNOSIS — N186 End stage renal disease: Secondary | ICD-10-CM

## 2020-01-31 DIAGNOSIS — J90 Pleural effusion, not elsewhere classified: Secondary | ICD-10-CM | POA: Diagnosis not present

## 2020-01-31 DIAGNOSIS — J9601 Acute respiratory failure with hypoxia: Secondary | ICD-10-CM | POA: Diagnosis not present

## 2020-01-31 HISTORY — PX: IR NEPHROSTOMY EXCHANGE RIGHT: IMG6070

## 2020-01-31 LAB — BASIC METABOLIC PANEL
Anion gap: 12 (ref 5–15)
BUN: 59 mg/dL — ABNORMAL HIGH (ref 8–23)
CO2: 28 mmol/L (ref 22–32)
Calcium: 7.8 mg/dL — ABNORMAL LOW (ref 8.9–10.3)
Chloride: 99 mmol/L (ref 98–111)
Creatinine, Ser: 6.46 mg/dL — ABNORMAL HIGH (ref 0.61–1.24)
GFR calc Af Amer: 8 mL/min — ABNORMAL LOW (ref >60)
GFR calc non Af Amer: 7 mL/min — ABNORMAL LOW (ref >60)
Glucose, Bld: 130 mg/dL — ABNORMAL HIGH (ref 70–99)
Potassium: 3.9 mmol/L (ref 3.5–5.1)
Sodium: 139 mmol/L (ref 135–145)

## 2020-01-31 LAB — PH, BODY FLUID: pH, Body Fluid: 7.4

## 2020-01-31 LAB — URINALYSIS, COMPLETE (UACMP) WITH MICROSCOPIC
Bacteria, UA: NONE SEEN
RBC / HPF: >50 RBC/hpf — ABNORMAL HIGH (ref 0–5)
Specific Gravity, Urine: 1.03 (ref 1.005–1.030)
Squamous Epithelial / HPF: NONE SEEN (ref 0–5)

## 2020-01-31 LAB — GLUCOSE, CAPILLARY
Glucose-Capillary: 119 mg/dL — ABNORMAL HIGH (ref 70–99)
Glucose-Capillary: 125 mg/dL — ABNORMAL HIGH (ref 70–99)
Glucose-Capillary: 98 mg/dL (ref 70–99)
Glucose-Capillary: 150 mg/dL — ABNORMAL HIGH (ref 70–99)

## 2020-01-31 LAB — PROTEIN, BODY FLUID (OTHER): Total Protein, Body Fluid Other: 2.7 g/dL

## 2020-01-31 SURGERY — DIALYSIS/PERMA CATHETER INSERTION
Anesthesia: Choice

## 2020-01-31 MED ORDER — ALTEPLASE 2 MG IJ SOLR
2.0000 mg | Freq: Once | INTRAMUSCULAR | Status: AC
  Administered 2020-01-31: 2 mg
  Filled 2020-01-31: qty 2

## 2020-01-31 MED ORDER — VANCOMYCIN HCL 1750 MG/350ML IV SOLN
1750.0000 mg | Freq: Once | INTRAVENOUS | Status: AC
  Administered 2020-01-31: 1750 mg via INTRAVENOUS
  Filled 2020-01-31: qty 350

## 2020-01-31 MED ORDER — METOPROLOL TARTRATE 50 MG PO TABS
50.0000 mg | ORAL_TABLET | Freq: Two times a day (BID) | ORAL | Status: DC
  Administered 2020-01-31 – 2020-02-01 (×4): 50 mg via ORAL
  Filled 2020-01-31 (×4): qty 1

## 2020-01-31 MED ORDER — VANCOMYCIN HCL IN DEXTROSE 1-5 GM/200ML-% IV SOLN
1000.0000 mg | INTRAVENOUS | Status: DC
  Filled 2020-01-31: qty 200

## 2020-01-31 MED ORDER — SODIUM CHLORIDE 0.9 % IV SOLN
INTRAVENOUS | Status: DC | PRN
  Administered 2020-01-31: 100 mL via INTRAVENOUS
  Administered 2020-02-01: 250 mL via INTRAVENOUS
  Administered 2020-02-04: 30 mL via INTRAVENOUS
  Administered 2020-02-04: 250 mL via INTRAVENOUS

## 2020-01-31 MED ORDER — IODIXANOL 320 MG/ML IV SOLN
50.0000 mL | Freq: Once | INTRAVENOUS | Status: AC | PRN
  Administered 2020-01-31: 10 mL

## 2020-01-31 MED ORDER — PIPERACILLIN-TAZOBACTAM IN DEX 2-0.25 GM/50ML IV SOLN
2.2500 g | Freq: Three times a day (TID) | INTRAVENOUS | Status: DC
  Administered 2020-01-31 – 2020-02-05 (×13): 2.25 g via INTRAVENOUS
  Filled 2020-01-31 (×16): qty 50

## 2020-01-31 NOTE — Progress Notes (Signed)
Son has visited the patient he was explain the plan for the patient tomorrow and about the revision of the right nephrostomy tube by IR. IR drain was not replaced.

## 2020-01-31 NOTE — Progress Notes (Signed)
[facility] Liaison note:  Patient has a pending referral for [facility] community Palliative program to follow at home with appointment set for 7/30. TOC Stephanie Bowen made aware.  Flo Shanks [facility], [place] [phone]

## 2020-01-31 NOTE — Progress Notes (Signed)
OT Cancellation Note  Patient Details Name: Blake [name] MRN: [id number] DOB: [birth year]   Cancelled Treatment:    Reason Eval/Treat Not Completed: Patient at procedure or test/ unavailable. Order received, chart reviewed. Upon arrival to the floor, pt with transport leaving room for test/procedure and unavailable for evaluation at this time. Will re-attempt at a later date time as pt is available, medically appropriate and schedule permits.   Jerilynn Birkenhead, OTS 01/31/20, 2:38 PM

## 2020-01-31 NOTE — Progress Notes (Signed)
PT Cancellation Note  Patient Details Name: ERMAN THUM MRN: [id number] DOB: 03-Sep-1922   Cancelled Treatment:    Reason Eval/Treat Not Completed: Patient at procedure or test/unavailable.  Pt currently not in room (is off floor for procedure).  Will re-attempt PT evaluation at a later date/time.  Leitha Bleak, PT 01/31/20, 2:47 PM

## 2020-01-31 NOTE — Progress Notes (Signed)
[facility]  ROUNDING NOTE   Subjective:   Palliative consult today.   Daughter was at bedside.   Discharge from urostomy tube.   Objective:  Vital signs in last 24 hours:  Temp:  [97.2 F (36.2 C)-98 F (36.7 C)] 97.8 F (36.6 C) (07/22 1153) Pulse Rate:  [73-128] 73 (07/22 1153) Resp:  [18-24] 20 (07/22 1153) BP: (109-133)/(81-100) 120/96 (07/22 1153) SpO2:  [85 %-100 %] 100 % (07/22 1153) Weight:  [92.8 kg] 92.8 kg (07/22 0500)  Weight change: -4.2 kg Filed Weights   01/29/20 0429 01/30/20 0500 01/31/20 0500  Weight: 96.1 kg 97 kg 92.8 kg    Intake/Output: I/O last 3 completed shifts: In: 0  Out: 220 [Urine:220]   Intake/Output this shift:  Total I/O In: 407.3 [I.V.:7; IV Piggyback:400.3] Out: 55 [Urine:55]  Physical Exam: General: NAD, laying in bed  Head: Normocephalic, atraumatic. Moist oral mucosal membranes  Eyes: Anicteric,   Neck: Supple, trachea midline  Lungs:  +rhonchi bilaterally   Heart: irregular   Abdomen:  Soft, nontender,   Extremities:  + peripheral edema.  Neurologic: Moving al four extremities.   Skin: No lesions  Access: Left femoral dialysis catheter 7/16 Dr. Lucky Cowboy    Basic Metabolic Panel: Recent Labs  Lab 01/25/20 0457 01/26/20 0807 01/27/20 0451 01/27/20 0451 01/28/20 1017 01/28/20 1017 01/28/20 1520 01/29/20 1030 01/31/20 0541  NA   < >  --  136  --  135  --  135 138 139  K   < >  --  4.0  --  3.8  --  3.5 3.7 3.9  CL   < >  --  95*  --  93*  --  96* 97* 99  CO2   < >  --  29  --  26  --  25 28 28   GLUCOSE   < >  --  93  --  97  --  132* 170* 130*  BUN   < >  --  95*  --  103*  --  66* 67* 59*  CREATININE   < >  --  7.29*  --  7.92*  --  5.88* 6.55* 6.46*  CALCIUM   < >  --  7.6*   < > 7.8*   < > 7.7* 7.8* 7.8*  MG  --   --   --   --   --   --   --  2.4  --   PHOS  --  8.6* 7.8*  --  8.3*  --   --  5.9*  --    < > = values in this interval not displayed.    Liver Function Tests: Recent Labs  Lab  01/27/20 0451 01/28/20 1017 01/29/20 1030  ALBUMIN 2.7* 2.7* 2.7*   No results for input(s): LIPASE, AMYLASE in the last 168 hours. No results for input(s): AMMONIA in the last 168 hours.  CBC: Recent Labs  Lab 01/27/20 0451 01/28/20 1017 01/29/20 1030  WBC 4.6 5.1 5.7  HGB 9.2* 9.7* 9.5*  HCT 28.3* 29.7* 29.4*  MCV 87.9 87.9 88.6  PLT 60* 52* 52*    Cardiac Enzymes: No results for input(s): CKTOTAL, CKMB, CKMBINDEX, TROPONINI in the last 168 hours.  BNP: Invalid input(s): POCBNP  CBG: Recent Labs  Lab 01/30/20 1125 01/30/20 1651 01/30/20 2046 01/31/20 0804 01/31/20 1150  GLUCAP 119* 101* 134* 119* 125*    Microbiology: Results for orders placed or performed during the hospital encounter of  01/23/20  Urine culture     Status: Abnormal   Collection Time: 01/23/20  9:40 AM   Specimen: Urine, Random  Result Value Ref Range Status   Specimen Description   Final    URINE, RANDOM Performed at [facility], [address] [place], [place] [address]    Special Requests   Final    NONE Performed at [facility], [place], [place] [address]    Culture 70,000 COLONIES/mL STAPHYLOCOCCUS EPIDERMIDIS (A)  Final   Report Status 01/26/2020 FINAL  Final   Organism ID, Bacteria STAPHYLOCOCCUS EPIDERMIDIS (A)  Final      Susceptibility   Staphylococcus epidermidis - MIC*    CIPROFLOXACIN >=8 RESISTANT Resistant     GENTAMICIN <=0.5 SENSITIVE Sensitive     NITROFURANTOIN <=16 SENSITIVE Sensitive     OXACILLIN >=4 RESISTANT Resistant     TETRACYCLINE 2 SENSITIVE Sensitive     VANCOMYCIN 1 SENSITIVE Sensitive     TRIMETH/SULFA 80 RESISTANT Resistant     CLINDAMYCIN <=0.25 SENSITIVE Sensitive     RIFAMPIN <=0.5 SENSITIVE Sensitive     Inducible Clindamycin NEGATIVE Sensitive     * 70,000 COLONIES/mL STAPHYLOCOCCUS EPIDERMIDIS  SARS Coronavirus 2 by RT PCR (hospital order, performed in [place] hospital lab) Nasopharyngeal  Nasopharyngeal Swab     Status: None   Collection Time: 01/23/20  4:00 PM   Specimen: Nasopharyngeal Swab  Result Value Ref Range Status   SARS Coronavirus 2 NEGATIVE NEGATIVE Final    Comment: (NOTE) SARS-CoV-2 target nucleic acids are NOT DETECTED.  The SARS-CoV-2 RNA is generally detectable in upper and lower respiratory specimens during the acute phase of infection. The lowest concentration of SARS-CoV-2 viral copies this assay can detect is 250 copies / mL. A negative result does not preclude SARS-CoV-2 infection and should not be used as the sole basis for treatment or other patient management decisions.  A negative result may occur with improper specimen collection / handling, submission of specimen other than nasopharyngeal swab, presence of viral mutation(s) within the areas targeted by this assay, and inadequate number of viral copies (<250 copies / mL). A negative result must be combined with clinical observations, patient history, and epidemiological information.  Fact Sheet for Patients:   StrictlyIdeas.no  Fact Sheet for Healthcare Providers: BankingDealers.co.za  This test is not yet approved or  cleared by the [place] FDA and has been authorized for detection and/or diagnosis of SARS-CoV-2 by FDA under an Emergency Use Authorization (EUA).  This EUA will remain in effect (meaning this test can be used) for the duration of the COVID-19 declaration under Section 564(b)(1) of the Act, 21 U.S.C. section 360bbb-3(b)(1), unless the authorization is terminated or revoked sooner.  Performed at [facility], [address] [place], [place] [address]   CULTURE, BLOOD (ROUTINE X 2) w Reflex to ID Panel     Status: None   Collection Time: 01/23/20  5:13 PM   Specimen: BLOOD  Result Value Ref Range Status   Specimen Description BLOOD BLOOD LEFT HAND  Final   Special Requests   Final    BOTTLES DRAWN AEROBIC  AND ANAEROBIC Blood Culture adequate volume   Culture   Final    NO GROWTH 5 DAYS Performed at [facility], [address] [place],  [address]    Report Status 01/28/2020 FINAL  Final  CULTURE, BLOOD (ROUTINE X 2) w Reflex to ID Panel     Status: None  Collection Time: 01/23/20  6:12 PM   Specimen: BLOOD  Result Value Ref Range Status   Specimen Description BLOOD BLOOD LEFT WRIST  Final   Special Requests Blood Culture adequate volume  Final   Culture   Final    NO GROWTH 5 DAYS Performed at [facility], [address] [place], [place] [address]    Report Status 01/28/2020 FINAL  Final  MRSA PCR Screening     Status: Abnormal   Collection Time: 01/25/20  8:17 AM   Specimen: Nasopharyngeal  Result Value Ref Range Status   MRSA by PCR POSITIVE (A) NEGATIVE Final    Comment:        The GeneXpert MRSA Assay (FDA approved for NASAL specimens only), is one component of a comprehensive MRSA colonization surveillance program. It is not intended to diagnose MRSA infection nor to guide or monitor treatment for MRSA infections. RESULT CALLED TO, READ BACK BY AND VERIFIED WITH: MARIA [facility] 01/25/20 1945 SJL Performed at [facility], [address] [place], [place] [address]   Body fluid culture     Status: None (Preliminary result)   Collection Time: 01/30/20  2:40 PM   Specimen: PATH Cytology Pleural fluid  Result Value Ref Range Status   Specimen Description   Final    PLEURAL Performed at [facility], [address] [place], [place] [address]    Special Requests   Final    NONE Performed at [facility], [address] [place], [place] [address]    Gram Stain   Final    RARE WBC PRESENT, PREDOMINANTLY MONONUCLEAR NO ORGANISMS SEEN    Culture   Final    NO GROWTH < 24 HOURS Performed at [facility], [place] [address], [place],  [address]    Report Status PENDING  Incomplete    Coagulation  Studies: No results for input(s): LABPROT, INR in the last 72 hours.  Urinalysis: Recent Labs    01/31/20 1030  COLORURINE RED*  LABSPEC 1.030  PHURINE TEST NOT REPORTED DUE TO COLOR INTERFERENCE OF URINE PIGMENT  GLUCOSEU TEST NOT REPORTED DUE TO COLOR INTERFERENCE OF URINE PIGMENT*  HGBUR TEST NOT REPORTED DUE TO COLOR INTERFERENCE OF URINE PIGMENT*  BILIRUBINUR TEST NOT REPORTED DUE TO COLOR INTERFERENCE OF URINE PIGMENT*  KETONESUR TEST NOT REPORTED DUE TO COLOR INTERFERENCE OF URINE PIGMENT*  PROTEINUR TEST NOT REPORTED DUE TO COLOR INTERFERENCE OF URINE PIGMENT*  NITRITE TEST NOT REPORTED DUE TO COLOR INTERFERENCE OF URINE PIGMENT*  LEUKOCYTESUR TEST NOT REPORTED DUE TO COLOR INTERFERENCE OF URINE PIGMENT*      Imaging: DG Chest 1 View  Result Date: 01/30/2020 CLINICAL DATA:  Follow-up right thoracentesis EXAM: CHEST  1 VIEW COMPARISON:  01/29/2020 FINDINGS: Dual lead pacemaker unchanged. Cardiomegaly and aortic atherosclerosis. Marked improvement on the right with only a small amount of residual pleural density and mild volume loss in the right lower lobe. Persistent left effusion and left lower lung atelectasis. IMPRESSION: Marked radiographic improvement following right thoracentesis. Small amount of remaining pleural fluid on the right with mild volume loss in the right lower lobe. No postprocedural pneumothorax. No change in left effusion with areas of atelectasis in the left lung. Electronically Signed   By: Nelson Chimes M.D.   On: 01/30/2020 17:03   DG Chest 1 View  Result Date: 01/29/2020 CLINICAL DATA:  86 year old male with tachycardia. EXAM: CHEST  1 VIEW COMPARISON:  Chest radiograph dated 01/23/2020. FINDINGS: There is near  complete opacification the right hemithorax consistent with a large pleural effusion with associated near complete compressive atelectasis of the right lung. Pneumonia or underlying mass is not excluded. Clinical correlation and follow-up to  resolution recommended. Overall significant interval progression of the right lung opacity compared to the radiograph of 01/23/2020. There is only a small aerated portion of the right lung. Interval increase in the size of the left pleural effusion with increase in the left lung base atelectasis or infiltrate. No pneumothorax. Stable cardiomegaly. Atherosclerotic calcification of the aorta. Left pectoral pacemaker device. Degenerative changes of the spine. No acute osseous pathology. IMPRESSION: 1. Large right pleural effusion with near complete compressive atelectasis of the right lung. Overall significant progression of right lung opacity since the prior radiograph. Pneumonia or underlying mass is not excluded. Clinical correlation and follow-up to resolution recommended. 2. Interval increase in the size of the left pleural effusion and left lung base atelectasis or infiltrate. Electronically Signed   By: Anner Crete M.D.   On: 01/29/2020 21:05   US THORACENTESIS ASP PLEURAL SPACE W/IMG GUIDE  Result Date: 01/30/2020 INDICATION: 86 year old male with a history of pleural effusion, referred for thoracentesis EXAM: ULTRASOUND GUIDED RIGHT THORACENTESIS MEDICATIONS: None. COMPLICATIONS: None PROCEDURE: An ultrasound guided thoracentesis was thoroughly discussed with the patient and questions answered. The benefits, risks, alternatives and complications were also discussed. The patient understands and wishes to proceed with the procedure. Written consent was obtained. Ultrasound was performed to localize and mark an adequate pocket of fluid in the right chest. The area was then prepped and draped in the normal sterile fashion. 1% Lidocaine was used for local anesthesia. Under ultrasound guidance a 8 Fr Safe-T-Centesis catheter was introduced. Thoracentesis was performed. The catheter was removed and a dressing applied. FINDINGS: A total of approximately 2400 cc of thin yellow fluid was removed. Samples were  sent to the laboratory as requested by the clinical team. IMPRESSION: Status post ultrasound-guided right-sided thoracentesis. Electronically Signed   By: Corrie Mckusick D.O.   On: 01/30/2020 15:35     Medications:   . sodium chloride 10 mL/hr at 01/31/20 1246  . piperacillin-tazobactam (ZOSYN)  IV Stopped (01/31/20 1049)  . vancomycin     . alteplase  2 mg Intracatheter Once  . ferrous sulfate  325 mg Oral Q breakfast   And  . vitamin C  250 mg Oral Q breakfast  . azelastine  1 spray Each Nare BID  . Chlorhexidine Gluconate Cloth  6 each Topical Q0600  . epoetin (EPOGEN/PROCRIT) injection  10,000 Units Intravenous Q T,Th,Sa-HD  . feeding supplement (ENSURE ENLIVE)  237 mL Oral BID BM  . finasteride  5 mg Oral Daily  . insulin aspart  0-5 Units Subcutaneous QHS  . insulin aspart  0-9 Units Subcutaneous TID WC  . mouth rinse  15 mL Mouth Rinse BID  . metoprolol tartrate  50 mg Oral BID  . multivitamin  1 tablet Oral QHS  . pantoprazole  40 mg Oral Daily  . rosuvastatin  5 mg Oral QHS  . sodium bicarbonate  650 mg Oral BID  . tamsulosin  0.4 mg Oral Daily   sodium chloride, acetaminophen, albuterol, metoprolol tartrate, ondansetron (ZOFRAN) IV  Assessment/ Plan:  Blake [name] is a 86 y.o. black male with acute renal failure on hemodialysis, hypertension, hyperlipidemia, diabetes mellitus type 2, GERD, obstructive sleep apnea, urothelial carcinoma of the bladder status post bilateral nephrostomy tube placement, permanent pacemaker placement secondary to sick sinus syndrome, SVT,  who is admitted to [facility] on 01/23/2020 for Weakness [R53.1] SOB (shortness of breath) [R06.02] Acute renal failure superimposed on stage 4 chronic kidney disease ([place]) [N17.9, N18.4] Acute renal failure superimposed on chronic kidney disease, unspecified CKD stage, unspecified acute renal failure type ([place]) [N17.9, N18.9] Patient initiated on hemodialysis on 7/17 for uremic symptoms.   1. End Stage  Renal Disease: with complication of dialysis device. Plan for TPA today. Plan on hemodialysis treatment for tomorrow.  - Continue sodium bicarbonate.   2. Anemia of chronic kidney disease.   - EPO with HD treatment.   3. Hypertension - metoprolol   4. Bilateral pleural effusions: status post Right thoracentesis on 7/21 with 2.4 liters removed.  Plan for left thoracentesis   LOS: 8 Durwin Davisson 7/22/20213:09 PM

## 2020-01-31 NOTE — Progress Notes (Signed)
Right nephrostomy tube urine sample obtained do to bloody output and sent down to lab.

## 2020-01-31 NOTE — Progress Notes (Signed)
   01/30/20 1005  Assess: MEWS Score  Temp 98.4 F (36.9 C)  BP 100/85  Pulse Rate 63  Resp (!) 22  SpO2 100 %  O2 Flow Rate (L/min) 3 L/min  Assess: MEWS Score  MEWS Temp 0  MEWS Systolic 1  MEWS Pulse 0  MEWS RR 1  MEWS LOC 0  MEWS Score 2  MEWS Score Color Yellow  Assess: if the MEWS score is Yellow or Red  Were vital signs taken at a resting state? Yes  Focused Assessment No change from prior assessment  Early Detection of Sepsis Score *See Row Information* Low  MEWS guidelines implemented *See Row Information* Yes  Treat  MEWS Interventions Administered scheduled meds/treatments  Pain Scale PAINAD  Pain Score 0  Take Vital Signs  Increase Vital Sign Frequency  Yellow: Q 2hr X 2 then Q 4hr X 2, if remains yellow, continue Q 4hrs  Escalate  MEWS: Escalate Yellow: discuss with charge nurse/RN and consider discussing with provider and RRT  Notify: Charge Nurse/RN  Name of Charge Nurse/RN Notified Percell Miller, RN  Date Charge Nurse/RN Notified 01/30/20  Time Charge Nurse/RN Notified 1007  Notify: Provider  Provider Name/Title Dr. Leslye Peer  Date Provider Notified 01/30/20  Time Provider Notified 1007  Notification Type Face-to-face  Notification Reason Other (Comment) (yellow MEWS)  Response See new orders (thoracentesis ordered)  Date of Provider Response 01/30/20  Time of Provider Response 1007  Notify: Rapid Response  Name of Rapid Response RN Notified  (not notified)  Document  Patient Outcome Not stable and remains on department  Progress note created (see row info) Yes  MD has see patient and assessed pt. Charge RN notified of continuation of frequent vitals yellow MEWS score. Nursing assistance notified and monitoring vital signs.

## 2020-01-31 NOTE — Progress Notes (Signed)
Patient ID: Blake [name], male   DOB: December 24, 1922, 86 y.o.   MRN: [id number] [facility] PROGRESS NOTE  TILER BRANDIS MWU:[id number] DOB: May 05, 1923 DOA: 01/23/2020 PCP: Adin Hector, MD  HPI/Subjective: Patient seen this morning.  Awaken from sleep.  I asked a few questions but he was pretty lethargic.  Patient did cough a few times while I was in the room. Nurse this morning and noticed some foul-smelling discharge coming from the right nephrostomy tube and blood coming out of the right nephrostomy tube. Patient initially admitted with decreased urine output and found to have acute on chronic kidney disease stage IV.  Objective: Vitals:   01/31/20 1000 01/31/20 1153  BP:  (!) 120/96  Pulse:  73  Resp:  20  Temp:  97.8 F (36.6 C)  SpO2: 100% 100%    Intake/Output Summary (Last 24 hours) at 01/31/2020 1529 Last data filed at 01/31/2020 1246 Gross per 24 hour  Intake 407.34 ml  Output 235 ml  Net 172.34 ml   Filed Weights   01/29/20 0429 01/30/20 0500 01/31/20 0500  Weight: 96.1 kg 97 kg 92.8 kg    ROS: Review of Systems  Unable to perform ROS: Acuity of condition   Exam: Physical Exam HENT:     Head: Normocephalic.     Nose: No mucosal edema.     Mouth/Throat:     Pharynx: No oropharyngeal exudate.  Eyes:     General: Lids are normal.     Pupils: Pupils are equal, round, and reactive to light.  Cardiovascular:     Rate and Rhythm: Regular rhythm. Tachycardia present.     Heart sounds: Normal heart sounds, S1 normal and S2 normal.  Pulmonary:     Breath sounds: Examination of the right-upper field reveals decreased breath sounds. Examination of the left-upper field reveals decreased breath sounds. Examination of the right-middle field reveals decreased breath sounds. Examination of the left-middle field reveals decreased breath sounds and rhonchi. Examination of the right-lower field reveals rhonchi. Examination of the left-lower field reveals rhonchi.  Decreased breath sounds and rhonchi present. No wheezing or rales.  Abdominal:     Palpations: Abdomen is soft.     Tenderness: There is no abdominal tenderness.  Musculoskeletal:     Right ankle: Swelling present.     Left ankle: Swelling present.  Skin:    General: Skin is warm.     Findings: No rash.  Neurological:     Mental Status: He is lethargic.       Data Reviewed: Basic Metabolic Panel: Recent Labs  Lab 01/25/20 0457 01/26/20 0807 01/27/20 0451 01/28/20 1017 01/28/20 1520 01/29/20 1030 01/31/20 0541  NA   < >  --  136 135 135 138 139  K   < >  --  4.0 3.8 3.5 3.7 3.9  CL   < >  --  95* 93* 96* 97* 99  CO2   < >  --  29 26 25 28 28   GLUCOSE   < >  --  93 97 132* 170* 130*  BUN   < >  --  95* 103* 66* 67* 59*  CREATININE   < >  --  7.29* 7.92* 5.88* 6.55* 6.46*  CALCIUM   < >  --  7.6* 7.8* 7.7* 7.8* 7.8*  MG  --   --   --   --   --  2.4  --   PHOS  --  8.6* 7.8* 8.3*  --  5.9*  --    < > = values in this interval not displayed.   Liver Function Tests: Recent Labs  Lab 01/27/20 0451 01/28/20 1017 01/29/20 1030  ALBUMIN 2.7* 2.7* 2.7*   CBC: Recent Labs  Lab 01/27/20 0451 01/28/20 1017 01/29/20 1030  WBC 4.6 5.1 5.7  HGB 9.2* 9.7* 9.5*  HCT 28.3* 29.7* 29.4*  MCV 87.9 87.9 88.6  PLT 60* 52* 52*   BNP (last 3 results) Recent Labs    09/24/19 1530 01/23/20 0948 01/29/20 1046  BNP 982.0* 2,109.0* 4,446.4*     CBG: Recent Labs  Lab 01/30/20 1125 01/30/20 1651 01/30/20 2046 01/31/20 0804 01/31/20 1150  GLUCAP 119* 101* 134* 119* 125*    Recent Results (from the past 240 hour(s))  Urine culture     Status: Abnormal   Collection Time: 01/23/20  9:40 AM   Specimen: Urine, Random  Result Value Ref Range Status   Specimen Description   Final    URINE, RANDOM Performed at [facility], [address], [place], [place] [address]    Special Requests   Final    NONE Performed at [facility], [place], [place] [address]    Culture 70,000 COLONIES/mL STAPHYLOCOCCUS EPIDERMIDIS (A)  Final   Report Status 01/26/2020 FINAL  Final   Organism ID, Bacteria STAPHYLOCOCCUS EPIDERMIDIS (A)  Final      Susceptibility   Staphylococcus epidermidis - MIC*    CIPROFLOXACIN >=8 RESISTANT Resistant     GENTAMICIN <=0.5 SENSITIVE Sensitive     NITROFURANTOIN <=16 SENSITIVE Sensitive     OXACILLIN >=4 RESISTANT Resistant     TETRACYCLINE 2 SENSITIVE Sensitive     VANCOMYCIN 1 SENSITIVE Sensitive     TRIMETH/SULFA 80 RESISTANT Resistant     CLINDAMYCIN <=0.25 SENSITIVE Sensitive     RIFAMPIN <=0.5 SENSITIVE Sensitive     Inducible Clindamycin NEGATIVE Sensitive     * 70,000 COLONIES/mL STAPHYLOCOCCUS EPIDERMIDIS  SARS Coronavirus 2 by RT PCR (hospital order, performed in [place] hospital lab) Nasopharyngeal Nasopharyngeal Swab     Status: None   Collection Time: 01/23/20  4:00 PM   Specimen: Nasopharyngeal Swab  Result Value Ref Range Status   SARS Coronavirus 2 NEGATIVE NEGATIVE Final    Comment: (NOTE) SARS-CoV-2 target nucleic acids are NOT DETECTED.  The SARS-CoV-2 RNA is generally detectable in upper and lower respiratory specimens during the acute phase of infection. The lowest concentration of SARS-CoV-2 viral copies this assay can detect is 250 copies / mL. A negative result does not preclude SARS-CoV-2 infection and should not be used as the sole basis for treatment or other patient management decisions.  A negative result may occur with improper specimen collection / handling, submission of specimen other than nasopharyngeal swab, presence of viral mutation(s) within the areas targeted by this assay, and inadequate number of viral copies (<250 copies / mL). A negative result must be combined with clinical observations, patient history, and epidemiological information.  Fact Sheet for Patients:   StrictlyIdeas.no  Fact Sheet for Healthcare  Providers: BankingDealers.co.za  This test is not yet approved or  cleared by the [place] FDA and has been authorized for detection and/or diagnosis of SARS-CoV-2 by FDA under an Emergency Use Authorization (EUA).  This EUA will remain in effect (meaning this test can be used) for the duration of the COVID-19 declaration under Section 564(b)(1) of the Act, 21 U.S.C. section 360bbb-3(b)(1), unless the authorization is terminated or revoked sooner.  Performed at [facility], [address] [place], [place] [address]   CULTURE, BLOOD (ROUTINE X 2) w Reflex to ID Panel     Status: None   Collection Time: 01/23/20  5:13 PM   Specimen: BLOOD  Result Value Ref Range Status   Specimen Description BLOOD BLOOD LEFT HAND  Final   Special Requests   Final    BOTTLES DRAWN AEROBIC AND ANAEROBIC Blood Culture adequate volume   Culture   Final    NO GROWTH 5 DAYS Performed at [facility], [address] [place], [place] [address]    Report Status 01/28/2020 FINAL  Final  CULTURE, BLOOD (ROUTINE X 2) w Reflex to ID Panel     Status: None   Collection Time: 01/23/20  6:12 PM   Specimen: BLOOD  Result Value Ref Range Status   Specimen Description BLOOD BLOOD LEFT WRIST  Final   Special Requests Blood Culture adequate volume  Final   Culture   Final    NO GROWTH 5 DAYS Performed at [facility], [address] [place], [place] [address]    Report Status 01/28/2020 FINAL  Final  MRSA PCR Screening     Status: Abnormal   Collection Time: 01/25/20  8:17 AM   Specimen: Nasopharyngeal  Result Value Ref Range Status   MRSA by PCR POSITIVE (A) NEGATIVE Final    Comment:        The GeneXpert MRSA Assay (FDA approved for NASAL specimens only), is one component of a comprehensive MRSA colonization surveillance program. It is not intended to diagnose MRSA infection nor to guide or monitor treatment for MRSA infections. RESULT CALLED  TO, READ BACK BY AND VERIFIED WITH: MARIA [facility] 01/25/20 1945 SJL Performed at [facility], [address] [place], [place] [address]   Body fluid culture     Status: None (Preliminary result)   Collection Time: 01/30/20  2:40 PM   Specimen: PATH Cytology Pleural fluid  Result Value Ref Range Status   Specimen Description   Final    PLEURAL Performed at [facility], [address] [place], [place] [address]    Special Requests   Final    NONE Performed at [facility], [address] [place], [place] [address]    Gram Stain   Final    RARE WBC PRESENT, PREDOMINANTLY MONONUCLEAR NO ORGANISMS SEEN    Culture   Final    NO GROWTH < 24 HOURS Performed at [facility], [place] [address], [place],  [address]    Report Status PENDING  Incomplete     Studies: DG Chest 1 View  Result Date: 01/30/2020 CLINICAL DATA:  Follow-up right thoracentesis EXAM: CHEST  1 VIEW COMPARISON:  01/29/2020 FINDINGS: Dual lead pacemaker unchanged. Cardiomegaly and aortic atherosclerosis. Marked improvement on the right with only a small amount of residual pleural density and mild volume loss in the right lower lobe. Persistent left effusion and left lower lung atelectasis. IMPRESSION: Marked radiographic improvement following right thoracentesis. Small amount of remaining pleural fluid on the right with mild volume loss in the right lower lobe. No postprocedural pneumothorax. No change in left effusion with areas of atelectasis in the left lung. Electronically Signed   By: Nelson Chimes M.D.   On: 01/30/2020 17:03   DG Chest 1 View  Result Date: 01/29/2020 CLINICAL DATA:  86 year old male with tachycardia. EXAM: CHEST  1 VIEW COMPARISON:  Chest radiograph dated 01/23/2020. FINDINGS: There  is near complete opacification the right hemithorax consistent with a large pleural effusion with associated near complete compressive atelectasis of the right lung. Pneumonia or  underlying mass is not excluded. Clinical correlation and follow-up to resolution recommended. Overall significant interval progression of the right lung opacity compared to the radiograph of 01/23/2020. There is only a small aerated portion of the right lung. Interval increase in the size of the left pleural effusion with increase in the left lung base atelectasis or infiltrate. No pneumothorax. Stable cardiomegaly. Atherosclerotic calcification of the aorta. Left pectoral pacemaker device. Degenerative changes of the spine. No acute osseous pathology. IMPRESSION: 1. Large right pleural effusion with near complete compressive atelectasis of the right lung. Overall significant progression of right lung opacity since the prior radiograph. Pneumonia or underlying mass is not excluded. Clinical correlation and follow-up to resolution recommended. 2. Interval increase in the size of the left pleural effusion and left lung base atelectasis or infiltrate. Electronically Signed   By: Anner Crete M.D.   On: 01/29/2020 21:05   IR NEPHROSTOMY EXCHANGE RIGHT  Result Date: 01/31/2020 INDICATION: Decreased and bloody output from right nephrostomy tube. EXAM: RIGHT NEPHROSTOMY TUBE CHECK. COMPARISON:  ULTRASOUND 01/23/2020. Nephrostomy tube exchange 12/28/2019. MEDICATIONS: None. ANESTHESIA/SEDATION: None. CONTRAST:  Ten cc of nonionic-administered into the collecting system(s) FLUOROSCOPY TIME:  Fluoroscopy Time: 0 minutes 20 seconds (5.7 mGy). COMPLICATIONS: None immediate. PROCEDURE: Informed written consent was obtained from the patient after a thorough discussion of the procedural risks, benefits and alternatives. All questions were addressed. Maximal Sterile Barrier Technique was utilized including caps, mask, sterile gowns, sterile gloves, sterile drape, hand hygiene and skin antiseptic. A timeout was performed prior to the initiation of the procedure. Right nephrostomy tube in stable position. Nephrostomy tube  was suction and Nonionic contrast was injected into the nephrostomy tube. Nephrostomy tube is widely patent. Nephrostomy tube in stable position within the renal collecting system. The tube was placed to gravity drainage. IMPRESSION: Right nephrostomy tube aspirated. Contrast administered demonstrating widely patent right nephrostomy tube in good and stable position. No tube exchange was needed. Electronically Signed   By: Marcello Moores  Register   On: 01/31/2020 15:23   US THORACENTESIS ASP PLEURAL SPACE W/IMG GUIDE  Result Date: 01/30/2020 INDICATION: 86 year old male with a history of pleural effusion, referred for thoracentesis EXAM: ULTRASOUND GUIDED RIGHT THORACENTESIS MEDICATIONS: None. COMPLICATIONS: None PROCEDURE: An ultrasound guided thoracentesis was thoroughly discussed with the patient and questions answered. The benefits, risks, alternatives and complications were also discussed. The patient understands and wishes to proceed with the procedure. Written consent was obtained. Ultrasound was performed to localize and mark an adequate pocket of fluid in the right chest. The area was then prepped and draped in the normal sterile fashion. 1% Lidocaine was used for local anesthesia. Under ultrasound guidance a 8 Fr Safe-T-Centesis catheter was introduced. Thoracentesis was performed. The catheter was removed and a dressing applied. FINDINGS: A total of approximately 2400 cc of thin yellow fluid was removed. Samples were sent to the laboratory as requested by the clinical team. IMPRESSION: Status post ultrasound-guided right-sided thoracentesis. Electronically Signed   By: Corrie Mckusick D.O.   On: 01/30/2020 15:35    Scheduled Meds: . alteplase  2 mg Intracatheter Once  . ferrous sulfate  325 mg Oral Q breakfast   And  . vitamin C  250 mg Oral Q breakfast  . azelastine  1 spray Each Nare BID  . Chlorhexidine Gluconate Cloth  6 each Topical Q0600  . epoetin (  EPOGEN/PROCRIT) injection  10,000 Units  Intravenous Q T,Th,Sa-HD  . feeding supplement (ENSURE ENLIVE)  237 mL Oral BID BM  . finasteride  5 mg Oral Daily  . insulin aspart  0-5 Units Subcutaneous QHS  . insulin aspart  0-9 Units Subcutaneous TID WC  . mouth rinse  15 mL Mouth Rinse BID  . metoprolol tartrate  50 mg Oral BID  . multivitamin  1 tablet Oral QHS  . pantoprazole  40 mg Oral Daily  . rosuvastatin  5 mg Oral QHS  . sodium bicarbonate  650 mg Oral BID  . tamsulosin  0.4 mg Oral Daily    Assessment/Plan:  1. Purulent drainage from right nephrostomy tube. Start antibiotics. Case discussed with interventional radiology team to change out right nephrostomy tube today. Right nephrostomy tube urine analysis and urine culture.  2. End-stage renal disease. Came in with acute kidney injury on chronic kidney disease stage IV.  Nephrology to do dialysis tomorrow. PermCath will be pushed off to next week. 3. Bilateral large pleural effusions. Right thoracentesis removed 2400 mL. We will get a left thoracentesis tomorrow. 4. SVT and persistent tachycardia increase metoprolol to 50 mg twice a day 5. History of urothelial carcinoma the bladder.  History of prostate cancer.  Status post bilateral nephrostomy tubes.  On Proscar and Flomax. 6. Acute hypoxic respiratory failure.  Continue oxygen supplementation.  Currently on 3 L. 7. Acute on chronic diastolic congestive heart failure.  BNP elevated and has bilateral pleural effusions.  Removed 2400 mL from right thoracentesis today 8. Type 2 diabetes mellitus with renal manifestations 9. Anemia of chronic disease 10. Chronic decubitus ulcer present on admission, stage II. 11. Palliative care consultation appreciated.       Code Status:     Code Status Orders  (From admission, onward)         Start     Ordered   01/23/20 1517  Full code  Continuous        01/23/20 1516        Code Status History    Date Active Date Inactive Code Status Order ID Comments User Context    11/04/2019 0118 11/05/2019 2145 Full Code [id number]  Neena Rhymes, MD ED   11/04/2019 0054 11/04/2019 0118 DNR [id number]  Neena Rhymes, MD ED   09/24/2019 2132 09/28/2019 2309 Full Code [id number]  Orene Desanctis, DO ED   01/03/2019 0142 01/09/2019 2238 Full Code [id number]  Mansy, Arvella Merles, MD ED   12/25/2018 1233 01/02/2019 1950 Full Code [id number]  Dustin Flock, MD Inpatient   12/25/2018 0645 12/25/2018 1233 Full Code [id number]  Paulette Blanch, MD ED   11/21/2018 2217 11/29/2018 2052 Full Code [id number]  Mayer Camel, NP ED   11/05/2018 1755 11/09/2018 1728 Full Code [id number]  Mayo, Pete Pelt, MD Inpatient   09/10/2017 1430 09/13/2017 2115 Full Code [id number]  Idelle Crouch, MD Inpatient   06/10/2017 2304 06/12/2017 1908 Full Code [id number]  Lance Coon, MD Inpatient   05/17/2017 0829 05/19/2017 1525 Full Code [id number]  Hillary Bow, MD ED   03/31/2017 1334 04/03/2017 1611 Full Code [id number]  Nicholes Mango, MD Inpatient   10/21/2016 0155 10/25/2016 2145 Full Code [id number]  Hugelmeyer, [place], DO Inpatient   10/09/2016 2342 10/13/2016 2009 Full Code [id number]  Vaughan Basta, MD Inpatient   04/14/2015 1432 04/15/2015 1739 Full Code [id number]  Isaias Cowman, MD Inpatient   04/09/2015 0022 04/14/2015 1432 Full Code [id number]  Hower, Aaron Mose, MD ED  Advance Care Planning Activity     Family Communication: Spoke with Dr. Primus Bravo on the phone twice today Disposition Plan: Status is: Inpatient  Dispo: The patient is from: Home              Anticipated d/c is to: Home              Anticipated d/c date is: PermCath will be pushed off to early next week with the possibility of infection from the right nephrostomy tube.              Patient with purulent drainage with the right nephrostomy tube. Interventional radiology to evaluate for change out of the tube today. Started on antibiotics. Will get left thoracocentesis tomorrow.  Consultants:  Nephrology  Procedures:  Dialysis  catheter  Thoracentesis  Time spent: 29 minutes. Case discussed with nephrology, interventional radiology team and vascular surgery.  [place]  [facility] [employer]

## 2020-01-31 NOTE — Care Management Important Message (Signed)
Important Message  Patient Details  Name: Blake [name] MRN: [id number] Date of Birth: 09-13-1922   Medicare Important Message Given:  Yes     [name] 01/31/2020, 1:23 PM

## 2020-01-31 NOTE — Consult Note (Addendum)
Consultation Note Date: 01/31/2020   Patient Name: Blake [name]  DOB: 07/05/1923  MRN: [id number]  Age / Sex: 86 y.o., male  PCP: Adin Hector, MD Referring Physician: Loletha Grayer, MD  Reason for Consultation: Establishing goals of care and Psychosocial/spiritual support  HPI/Patient Profile: 86 y.o. 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  Right thoracentesis 7/21 2400 mL removed.  Blake [name] was to see outpatient palliative services on 7/30 for new intake.  Clinical Assessment and Goals of Care: Blake [name] is well-known to the palliative medicine team.  There have been several consultations with inpatient palliative services, and, Blake [name] was scheduled for outpatient palliative services intake meeting on 7/30 with [facility] outpatient palliative.  [facility] inpatient liaison is aware of admit.  Blake [name] is resting quietly in bed.  He appears acutely ill/chrnically ill and frail. I tell him my name and he responds by telling me that his name is Blake [name].  He quickly closes his eyes and drifts off to sleep.  I believe that he is able to make his basic needs known.  There is no family at bedside at this time.  Call to daughter/healthcare surrogate, Blake [name], DDS.  We talk in detail about [name] treatment plan including, but not limited to, right thoracentesis with 2400 cc removed, plan for left thoracentesis, (how quickly fluid will build up again remains to be seen), plan for replacement of nephrostomy tube, waiting for  permacath placement until we are reassured there is no risk for infecting HD cath.  We talk about [place] vs STR based on needs, PT evaluation.  Blake [name] has questions about coordination of care in the future.  I reassure her that the transition of care team will help while he is hospitalized, social worker onsite at rehab will also help with coordination of care.  Blake [name] continues to be open to outpatient palliative services to follow.  We talked about time for outcomes.  Conference with attending, bedside nursing staff, transition of care team related to patient condition, needs, new hemodialysis, disposition.   HCPOA    HCPOA - daughter, Blake [name] DDS is Air traffic controller.      SUMMARY OF RECOMMENDATIONS   Continue to treat the treatable Continue FULL SCOPE/FULL CODE   Code Status/Advance Care Planning:  Full code -CODE STATUS discussions were held with daughter by attending provider on 7/21.  Not revisited today  Symptom Management:   Per hospitalist, no additional needs at this time.  Palliative Prophylaxis:   Oral Care and Turn Reposition  Additional Recommendations (Limitations, Scope, Preferences):  Full Scope Treatment  Psycho-social/Spiritual:   Desire for further Chaplaincy support:no  Additional Recommendations: Caregiving  Support/Resources and Education on Hospice  Prognosis:   Unable to determine, based on outcomes.  Guarded at this point due to new onset of dialysis, frailty, poor functional status, decreasing trend in albumin.  Discharge Planning: To be determined, based on outcomes.  Would likely qualify for short-term rehab.      Primary Diagnoses: Present on Admission: . Urothelial carcinoma of bladder ([place]) . SVT (supraventricular tachycardia) ([place]) . Recurrent UTI . HLD (hyperlipidemia) . Acute renal failure superimposed on stage 4 chronic kidney disease ([place]) . HTN (hypertension) . Type II diabetes mellitus with renal manifestations  ([place]) . Acute respiratory failure with hypoxia ([place]) . Normocytic anemia   I have reviewed the medical record, interviewed the patient and family, and examined the patient. The following aspects are pertinent.  Past Medical History:  Diagnosis Date  . AAA (abdominal aortic aneurysm) ([place])   . Arrhythmia   . CHF (congestive heart failure) ([place])   . Diabetes mellitus without complication ([place])   . GERD (gastroesophageal reflux disease)   . GI bleed   . Heart murmur   . Hyperlipidemia   . Hypertension   . Pacemaker   . Pneumonia   . Prostate cancer ([place])   . Sleep apnea    Social History   Socioeconomic History  . Marital status: Married    Spouse name: Not on file  . Number of children: Not on file  . Years of education: Not on file  . Highest education level: Not on file  Occupational History  . Not on file  Tobacco Use  . Smoking status: Former Research scientist (life sciences)  . Smokeless tobacco: Never Used  Vaping Use  . Vaping Use: Never used  Substance and Sexual Activity  . Alcohol use: No  . Drug use: No  . Sexual activity: Not Currently  Other Topics Concern  . Not on file  Social History Narrative  . Not on file   Social Determinants of Health   Financial Resource Strain:   . Difficulty of Paying Living Expenses:   Food Insecurity:   . Worried About Charity fundraiser in the Last Year:   . Arboriculturist in the Last Year:   Transportation Needs:   . Film/video editor (Medical):   Marland Kitchen Lack of Transportation (Non-Medical):   Physical Activity:   . Days of Exercise per Week:   . Minutes of Exercise per Session:   Stress:   . Feeling of Stress :   Social Connections:   . Frequency of Communication with Friends and Family:   . Frequency of Social Gatherings with Friends and Family:   . Attends Religious Services:   . Active Member of Clubs or Organizations:   . Attends Archivist Meetings:   Marland Kitchen Marital Status:    Family History  Problem Relation Age of  Onset  . Hypertension Other   . Stroke Other   . Heart attack Other   . Stroke Sister   . Heart attack Brother    Scheduled Meds: . ferrous sulfate  325 mg Oral Q breakfast   And  . vitamin C  250 mg Oral Q breakfast  . azelastine  1 spray Each Nare BID  . Chlorhexidine Gluconate Cloth  6 each Topical Q0600  . epoetin (EPOGEN/PROCRIT) injection  10,000 Units Intravenous Q T,Th,Sa-HD  . feeding supplement (ENSURE ENLIVE)  237 mL Oral BID BM  . finasteride  5 mg Oral Daily  . insulin aspart  0-5 Units Subcutaneous  QHS  . insulin aspart  0-9 Units Subcutaneous TID WC  . mouth rinse  15 mL Mouth Rinse BID  . metoprolol tartrate  50 mg Oral BID  . multivitamin  1 tablet Oral QHS  . pantoprazole  40 mg Oral Daily  . rosuvastatin  5 mg Oral QHS  . sodium bicarbonate  650 mg Oral BID  . tamsulosin  0.4 mg Oral Daily   Continuous Infusions: . sodium chloride 10 mL/hr at 01/31/20 1246  . piperacillin-tazobactam (ZOSYN)  IV Stopped (01/31/20 1049)   PRN Meds:.sodium chloride, acetaminophen, albuterol, metoprolol tartrate, ondansetron (ZOFRAN) IV Medications Prior to Admission:  Prior to Admission medications   Medication Sig Start Date End Date Taking? Authorizing Provider  azelastine (ASTELIN) 0.1 % nasal spray SMARTSIG:1 Spray(s) Both Nares Twice Daily PRN 11/12/19  Yes [provider]  ciprofloxacin (CIPRO) 250 MG tablet Take 250 mg by mouth 2 (two) times daily. 01/10/20  Yes [provider]  feeding supplement, GLUCERNA SHAKE, (GLUCERNA SHAKE) LIQD Take 237 mLs by mouth 2 (two) times daily between meals. 01/01/19  Yes Fritzi Mandes, MD  finasteride (PROSCAR) 5 MG tablet Take 5 mg by mouth daily.   Yes [provider]  Iron-Vitamin C (VITRON-C PO) Take 1 tablet by mouth daily.   Yes [provider]  metoprolol tartrate 37.5 MG TABS Take 37.5 mg by mouth 2 (two) times daily. 09/13/17  Yes Gladstone Lighter, MD  Multiple Vitamins-Minerals (PRESERVISION AREDS)  CAPS Take 1 capsule by mouth 2 (two) times daily.   Yes [provider]  multivitamin-iron-minerals-folic acid (CENTRUM) chewable tablet Chew 2 tablets by mouth daily.   Yes [provider]  pantoprazole (PROTONIX) 40 MG tablet Take 40 mg by mouth daily.   Yes [provider]  rosuvastatin (CRESTOR) 5 MG tablet Take 5 mg by mouth at bedtime.   Yes [provider]  sitaGLIPtin (JANUVIA) 50 MG tablet Take 0.5 tablets (25 mg total) by mouth daily. 01/01/19  Yes Fritzi Mandes, MD  sodium bicarbonate 650 MG tablet Take 1 tablet (650 mg total) by mouth 2 (two) times daily. 09/13/17  Yes Gladstone Lighter, MD  tamsulosin (FLOMAX) 0.4 MG CAPS capsule Take 0.4 mg by mouth daily.    Yes [provider]  torsemide (DEMADEX) 20 MG tablet Take 40 mg by mouth 2 (two) times daily.  03/29/19 03/28/20 Yes [provider]   Allergies  Allergen Reactions  . Cefepime     seizure  . Diovan [Valsartan] Cough  . Gabapentin Other (See Comments)    Sedation at all doses  . Hydralazine Itching  . Lisinopril Cough  . Pregabalin Other (See Comments)    Sedation at all doses  . Levofloxacin Other (See Comments)    Too many side effects. Nausea, vomiting, upset stomach, increased confusion, etc Other reaction(s): Confusion Nausea, chills Nausea, chills   . Sulfamethoxazole-Trimethoprim Other (See Comments)    Too many side effects. Nausea, vomiting, upset stomach, increased confusion, etc   Review of Systems  Unable to perform ROS: Age    Physical Exam Vitals and nursing note reviewed.  Constitutional:      General: He is not in acute distress.    Appearance: He is ill-appearing.  HENT:     Mouth/Throat:     Mouth: Mucous membranes are moist.  Cardiovascular:     Rate and Rhythm: Normal rate.  Pulmonary:     Effort: Pulmonary effort is normal. No respiratory distress.  Abdominal:  General: Abdomen is flat. There is no distension.  Skin:    General:  Skin is warm and dry.  Neurological:     Comments: Tells me his name, goes to sleep quickly  Psychiatric:     Comments: Calm and cooperative     Vital Signs: BP (!) 120/96 (BP Location: Left Arm)   Pulse 73   Temp 97.8 F (36.6 C)   Resp 20   Ht 6' (1.829 m)   Wt 92.8 kg   SpO2 100%   BMI 27.75 kg/m  Pain Scale: PAINAD   Pain Score: 0-No pain   SpO2: SpO2: 100 % O2 Device:SpO2: 100 % O2 Flow Rate: .O2 Flow Rate (L/min): 3 L/min  IO: Intake/output summary:   Intake/Output Summary (Last 24 hours) at 01/31/2020 1316 Last data filed at 01/31/2020 1246 Gross per 24 hour  Intake 407.34 ml  Output 235 ml  Net 172.34 ml    LBM: Last BM Date: 01/29/20 Baseline Weight: Weight: 81.6 kg Most recent weight: Weight: 92.8 kg     Palliative Assessment/Data:   Flowsheet Rows     Most Recent Value  Intake Tab  Referral Department Hospitalist  Unit at Time of Referral Med/Surg Unit  Palliative Care Primary Diagnosis Nephrology  Date Notified 01/30/20  Palliative Care Type Return patient Palliative Care  Reason for referral Clarify Goals of Care  Date of Admission 01/23/20  Date first seen by Palliative Care 01/31/20  # of days Palliative referral response time 1 Day(s)  # of days IP prior to Palliative referral 7  Clinical Assessment  Palliative Performance Scale Score 30%  Pain Max last 24 hours Not able to report  Pain Min Last 24 hours Not able to report  Dyspnea Max Last 24 Hours Not able to report  Dyspnea Min Last 24 hours Not able to report  Psychosocial & Spiritual Assessment  Palliative Care Outcomes      Time In: 1100 Time Out: 1130 Time Total: 30 minutes  Greater than 50%  of this time was spent counseling and coordinating care related to the above assessment and plan.  Signed by: Drue Novel, NP   Please contact Palliative Medicine Team phone at [phone] for questions and concerns.  For individual provider: See Shea Evans

## 2020-01-31 NOTE — Consult Note (Signed)
Pharmacy Antibiotic Note  Blake [name] is a 86 y.o. male with ESRD on HD admitted on 01/23/2020 with wound infection.    Pharmacy has been consulted for Vancomycin/Zosyn dosing.  Plan: Will give Vancomycin 1750mg  IV x 1 as loading dose, followed by 1000mg  TTS following HD if pt tolerates full HD treatment.  Will start Zosyn 2.25g q8h per protocol for HD pt   Height: 6' (182.9 cm) Weight: 92.8 kg (204 lb 9.4 oz) IBW/kg (Calculated) : 77.6  Temp (24hrs), Avg:97.8 F (36.6 C), Min:97.4 F (36.3 C), Max:98.4 F (36.9 C)  Recent Labs  Lab 01/27/20 0451 01/28/20 1017 01/28/20 1520 01/29/20 1030 01/31/20 0541  WBC 4.6 5.1  --  5.7  --   CREATININE 7.29* 7.92* 5.88* 6.55* 6.46*    Estimated Creatinine Clearance: 7.5 mL/min (A) (by C-G formula based on SCr of 6.46 mg/dL (H)).    Allergies  Allergen Reactions  . Cefepime     seizure  . Diovan [Valsartan] Cough  . Gabapentin Other (See Comments)    Sedation at all doses  . Hydralazine Itching  . Lisinopril Cough  . Pregabalin Other (See Comments)    Sedation at all doses  . Levofloxacin Other (See Comments)    Too many side effects. Nausea, vomiting, upset stomach, increased confusion, etc Other reaction(s): Confusion Nausea, chills Nausea, chills   . Sulfamethoxazole-Trimethoprim Other (See Comments)    Too many side effects. Nausea, vomiting, upset stomach, increased confusion, etc    Antimicrobials this admission: Zosyn 7/22 >> Vancomycin 7/22 >>  Dose adjustments this admission: None   Microbiology results: New wnd Cx 7/21 >>  New UCx 7/21 pending  Previous UCx 7/14 Staph Epi 70K (treated w 3 days aztreonam) Previous BCx 7/14 NG final  Thank you for allowing pharmacy to be a part of this patient's care.  Lu Duffel, PharmD, BCPS Clinical Pharmacist 01/31/2020 8:33 AM

## 2020-01-31 NOTE — Progress Notes (Signed)
   01/31/20 0955  Assess: MEWS Score  Temp (!) 97.2 F (36.2 C)  BP 123/89  Pulse Rate (!) 124  Resp 20  Level of Consciousness Alert  SpO2 92 %  O2 Device Nasal Cannula  O2 Flow Rate (L/min) 5 L/min  Assess: MEWS Score  MEWS Temp 0  MEWS Systolic 0  MEWS Pulse 2  MEWS RR 0  MEWS LOC 0  MEWS Score 2  MEWS Score Color Yellow  Assess: if the MEWS score is Yellow or Red  Were vital signs taken at a resting state? Yes  Focused Assessment No change from prior assessment  Early Detection of Sepsis Score *See Row Information* Low  MEWS guidelines implemented *See Row Information* Yes  Treat  MEWS Interventions Administered scheduled meds/treatments  Pain Scale PAINAD  Pain Score 0  Breathing 0  Negative Vocalization 0  Facial Expression 0  Body Language 0  Consolability 0  PAINAD Score 0  Take Vital Signs  Increase Vital Sign Frequency  Yellow: Q 2hr X 2 then Q 4hr X 2, if remains yellow, continue Q 4hrs  Escalate  MEWS: Escalate Yellow: discuss with charge nurse/RN and consider discussing with provider and RRT  Notify: Charge Nurse/RN  Name of Charge Nurse/RN Notified Candelaria Celeste, RN  Date Charge Nurse/RN Notified 01/31/20  Time Charge Nurse/RN Notified 1000  Notify: Provider  Provider Name/Title Dr. Leslye Peer  Date Provider Notified 01/31/20  Time Provider Notified [phone]  Notification Type Face-to-face  Notification Reason Other (Comment) (heart rate above 120's)  Response Other (Comment) (metoprolol dose increased)  Date of Provider Response 01/31/20  Time of Provider Response 0955  Notify: Rapid Response  Name of Rapid Response RN Notified  (not notified at this time)  Document  Patient Outcome Not stable and remains on department  Progress note created (see row info) Yes  MD has been notified of the patient's heart rate still elevated above 120 beats per minute. Metoprolol dose has been increased by MD. Charge nurse has been notified. Nurse tech notified to continue  obtaining Q4 vital signs for continuation of Yellow MEWS score.

## 2020-02-01 ENCOUNTER — Inpatient Hospital Stay: Payer: Medicare PPO

## 2020-02-01 DIAGNOSIS — J9601 Acute respiratory failure with hypoxia: Secondary | ICD-10-CM | POA: Diagnosis not present

## 2020-02-01 DIAGNOSIS — I5033 Acute on chronic diastolic (congestive) heart failure: Secondary | ICD-10-CM | POA: Diagnosis not present

## 2020-02-01 DIAGNOSIS — T148XXA Other injury of unspecified body region, initial encounter: Secondary | ICD-10-CM | POA: Diagnosis not present

## 2020-02-01 DIAGNOSIS — R000 Tachycardia, unspecified: Secondary | ICD-10-CM

## 2020-02-01 DIAGNOSIS — R531 Weakness: Secondary | ICD-10-CM

## 2020-02-01 DIAGNOSIS — L24A9 Irritant contact dermatitis due friction or contact with other specified body fluids: Secondary | ICD-10-CM

## 2020-02-01 DIAGNOSIS — N186 End stage renal disease: Secondary | ICD-10-CM | POA: Diagnosis not present

## 2020-02-01 LAB — CBC
HCT: 29.4 % — ABNORMAL LOW (ref 39.0–52.0)
Hemoglobin: 9.3 g/dL — ABNORMAL LOW (ref 13.0–17.0)
MCH: 28.5 pg (ref 26.0–34.0)
MCHC: 31.6 g/dL (ref 30.0–36.0)
MCV: 90.2 fL (ref 80.0–100.0)
Platelets: 53 10*3/uL — ABNORMAL LOW (ref 150–400)
RBC: 3.26 MIL/uL — ABNORMAL LOW (ref 4.22–5.81)
RDW: 17 % — ABNORMAL HIGH (ref 11.5–15.5)
WBC: 7.9 10*3/uL (ref 4.0–10.5)
nRBC: 0 % (ref 0.0–0.2)

## 2020-02-01 LAB — URINE CULTURE: Special Requests: NORMAL

## 2020-02-01 LAB — RENAL FUNCTION PANEL
Albumin: 2.5 g/dL — ABNORMAL LOW (ref 3.5–5.0)
Anion gap: 7 (ref 5–15)
BUN: 34 mg/dL — ABNORMAL HIGH (ref 8–23)
CO2: 33 mmol/L — ABNORMAL HIGH (ref 22–32)
Calcium: 7.8 mg/dL — ABNORMAL LOW (ref 8.9–10.3)
Chloride: 97 mmol/L — ABNORMAL LOW (ref 98–111)
Creatinine, Ser: 3.65 mg/dL — ABNORMAL HIGH (ref 0.61–1.24)
GFR calc Af Amer: 15 mL/min — ABNORMAL LOW (ref >60)
GFR calc non Af Amer: 13 mL/min — ABNORMAL LOW (ref >60)
Glucose, Bld: 146 mg/dL — ABNORMAL HIGH (ref 70–99)
Phosphorus: 2.7 mg/dL (ref 2.5–4.6)
Potassium: 3.3 mmol/L — ABNORMAL LOW (ref 3.5–5.1)
Sodium: 137 mmol/L (ref 135–145)

## 2020-02-01 LAB — BODY FLUID CELL COUNT WITH DIFFERENTIAL
Eos, Fluid: 0 %
Lymphs, Fluid: 16 %
Monocyte-Macrophage-Serous Fluid: 48 %
Neutrophil Count, Fluid: 36 %
Total Nucleated Cell Count, Fluid: 540 cu mm

## 2020-02-01 LAB — PROTEIN, PLEURAL OR PERITONEAL FLUID: Total protein, fluid: <3 g/dL

## 2020-02-01 LAB — GLUCOSE, CAPILLARY
Glucose-Capillary: 109 mg/dL — ABNORMAL HIGH (ref 70–99)
Glucose-Capillary: 124 mg/dL — ABNORMAL HIGH (ref 70–99)
Glucose-Capillary: 163 mg/dL — ABNORMAL HIGH (ref 70–99)

## 2020-02-01 LAB — LACTATE DEHYDROGENASE, PLEURAL OR PERITONEAL FLUID: LD, Fluid: 47 U/L — ABNORMAL HIGH (ref 3–23)

## 2020-02-01 MED ORDER — VANCOMYCIN HCL IN DEXTROSE 1-5 GM/200ML-% IV SOLN
1000.0000 mg | INTRAVENOUS | Status: DC | PRN
  Filled 2020-02-01: qty 200

## 2020-02-01 MED ORDER — VANCOMYCIN HCL IN DEXTROSE 1-5 GM/200ML-% IV SOLN
1000.0000 mg | Freq: Once | INTRAVENOUS | Status: AC
  Administered 2020-02-01: 1000 mg via INTRAVENOUS
  Filled 2020-02-01: qty 200

## 2020-02-01 MED ORDER — AMMONIUM LACTATE 12 % EX LOTN
TOPICAL_LOTION | CUTANEOUS | Status: DC | PRN
  Filled 2020-02-01: qty 400

## 2020-02-01 NOTE — TOC Progression Note (Signed)
Transition of Care [facility]) - Progression Note    Patient Details  Name: Blake [name] MRN: [id number] Date of Birth: [birth year]  Transition of Care [facility]) CM/SW Contact  Beverly Sessions, RN Phone Number: 02/01/2020, 10:10 AM  Clinical Narrative:    Plan for thoracentesis, change out of right nephrostomy tube, and perm cath placement  PT eval pending.  Patient is open with home health services. Will await PT recommendation for [facility] vs SNF.  pending referral for [facility] community Palliative program to follow at home with appointment set for 7/30.    Expected Discharge Plan: [place] Barriers to Discharge: Continued Medical Work up  Expected Discharge Plan and Services Expected Discharge Plan: [place] arrangements for the past 2 months: Single Family Home                                       Social Determinants of Health (SDOH) Interventions    Readmission Risk Interventions Readmission Risk Prevention Plan 01/25/2020 09/25/2019 12/30/2018  Transportation Screening Complete Complete Complete  PCP or Specialist Appt within 3-5 Days - Complete -  HRI or [place] - Complete -  Social Work Consult for [place] Planning/Counseling - Complete -  Palliative Care Screening - Not Applicable -  Medication Review Press photographer) Complete Referral to Pharmacy Complete  HRI or [place] Complete - [place] Patient Refused - -  Some recent data might be hidden

## 2020-02-01 NOTE — Progress Notes (Signed)
[facility]  ROUNDING NOTE   Subjective:   Seen and examined on hemodialysis treatment. Tolerating treatment well.     HEMODIALYSIS FLOWSHEET:  Blood Flow Rate (mL/min): 300 mL/min Arterial Pressure (mmHg): -250 mmHg Venous Pressure (mmHg): 80 mmHg Transmembrane Pressure (mmHg): 50 mmHg Ultrafiltration Rate (mL/min): 400 mL/min Dialysate Flow Rate (mL/min): 800 ml/min Conductivity: Machine : 13.5 Conductivity: Machine : 13.5 Dialysis Fluid Bolus: Normal Saline Bolus Amount (mL): 200 mL    Objective:  Vital signs in last 24 hours:  Temp:  [97.6 F (36.4 C)-97.9 F (36.6 C)] 97.6 F (36.4 C) (07/23 0900) Pulse Rate:  [53-114] 102 (07/23 1045) Resp:  [15-24] 20 (07/23 1045) BP: (108-127)/(78-107) 108/78 (07/23 1045) SpO2:  [88 %-100 %] 100 % (07/23 1045) Weight:  [93.1 kg] 93.1 kg (07/23 0439)  Weight change: 0.3 kg Filed Weights   01/30/20 0500 01/31/20 0500 02/01/20 0439  Weight: 97 kg 92.8 kg (!) 93.1 kg    Intake/Output: I/O last 3 completed shifts: In: 484 [I.V.:12.2; IV Piggyback:471.8] Out: 241 [Urine:241]   Intake/Output this shift:  Total I/O In: 240 [P.O.:240] Out: -   Physical Exam: General: NAD   Head: Normocephalic, atraumatic. Moist oral mucosal membranes  Eyes: Anicteric,   Neck: Supple, trachea midline  Lungs:  +rhonchi bilaterally   Heart: irregular   Abdomen:  Soft, nontender,   Extremities:  + peripheral edema.  Neurologic: Moving al four extremities. Answers questions intermittently.   Skin: No lesions  Access: Left femoral dialysis catheter 7/16 Dr. Lucky Cowboy    Basic Metabolic Panel: Recent Labs  Lab 01/26/20 0807 01/27/20 0451 01/27/20 0451 01/28/20 1017 01/28/20 1017 01/28/20 1520 01/28/20 1520 01/29/20 1030 01/31/20 0541 02/01/20 1048  NA  --  136   < > 135  --  135  --  138 139 137  K  --  4.0   < > 3.8  --  3.5  --  3.7 3.9 3.3*  CL  --  95*   < > 93*  --  96*  --  97* 99 97*  CO2  --  29   < > 26  --  25   --  28 28 33*  GLUCOSE  --  93   < > 97  --  132*  --  170* 130* 146*  BUN  --  95*   < > 103*  --  66*  --  67* 59* 34*  CREATININE  --  7.29*   < > 7.92*  --  5.88*  --  6.55* 6.46* 3.65*  CALCIUM  --  7.6*   < > 7.8*   < > 7.7*   < > 7.8* 7.8* 7.8*  MG  --   --   --   --   --   --   --  2.4  --   --   PHOS 8.6* 7.8*  --  8.3*  --   --   --  5.9*  --  2.7   < > = values in this interval not displayed.    Liver Function Tests: Recent Labs  Lab 01/27/20 0451 01/28/20 1017 01/29/20 1030 02/01/20 1048  ALBUMIN 2.7* 2.7* 2.7* 2.5*   No results for input(s): LIPASE, AMYLASE in the last 168 hours. No results for input(s): AMMONIA in the last 168 hours.  CBC: Recent Labs  Lab 01/27/20 0451 01/28/20 1017 01/29/20 1030 02/01/20 1048  WBC 4.6 5.1 5.7 7.9  HGB 9.2* 9.7* 9.5* 9.3*  HCT 28.3* 29.7* 29.4* 29.4*  MCV 87.9 87.9 88.6 90.2  PLT 60* 52* 52* 53*    Cardiac Enzymes: No results for input(s): CKTOTAL, CKMB, CKMBINDEX, TROPONINI in the last 168 hours.  BNP: Invalid input(s): POCBNP  CBG: Recent Labs  Lab 01/31/20 0804 01/31/20 1150 01/31/20 1642 01/31/20 2227 02/01/20 0740  GLUCAP 119* 125* 98 150* 109*    Microbiology: Results for orders placed or performed during the hospital encounter of 01/23/20  Urine culture     Status: Abnormal   Collection Time: 01/23/20  9:40 AM   Specimen: Urine, Random  Result Value Ref Range Status   Specimen Description   Final    URINE, RANDOM Performed at [facility], [address] [place], [place] [address]    Special Requests   Final    NONE Performed at [facility], [place], [place] [address]    Culture 70,000 COLONIES/mL STAPHYLOCOCCUS EPIDERMIDIS (A)  Final   Report Status 01/26/2020 FINAL  Final   Organism ID, Bacteria STAPHYLOCOCCUS EPIDERMIDIS (A)  Final      Susceptibility   Staphylococcus epidermidis - MIC*    CIPROFLOXACIN >=8 RESISTANT Resistant     GENTAMICIN <=0.5  SENSITIVE Sensitive     NITROFURANTOIN <=16 SENSITIVE Sensitive     OXACILLIN >=4 RESISTANT Resistant     TETRACYCLINE 2 SENSITIVE Sensitive     VANCOMYCIN 1 SENSITIVE Sensitive     TRIMETH/SULFA 80 RESISTANT Resistant     CLINDAMYCIN <=0.25 SENSITIVE Sensitive     RIFAMPIN <=0.5 SENSITIVE Sensitive     Inducible Clindamycin NEGATIVE Sensitive     * 70,000 COLONIES/mL STAPHYLOCOCCUS EPIDERMIDIS  SARS Coronavirus 2 by RT PCR (hospital order, performed in [place] hospital lab) Nasopharyngeal Nasopharyngeal Swab     Status: None   Collection Time: 01/23/20  4:00 PM   Specimen: Nasopharyngeal Swab  Result Value Ref Range Status   SARS Coronavirus 2 NEGATIVE NEGATIVE Final    Comment: (NOTE) SARS-CoV-2 target nucleic acids are NOT DETECTED.  The SARS-CoV-2 RNA is generally detectable in upper and lower respiratory specimens during the acute phase of infection. The lowest concentration of SARS-CoV-2 viral copies this assay can detect is 250 copies / mL. A negative result does not preclude SARS-CoV-2 infection and should not be used as the sole basis for treatment or other patient management decisions.  A negative result may occur with improper specimen collection / handling, submission of specimen other than nasopharyngeal swab, presence of viral mutation(s) within the areas targeted by this assay, and inadequate number of viral copies (<250 copies / mL). A negative result must be combined with clinical observations, patient history, and epidemiological information.  Fact Sheet for Patients:   StrictlyIdeas.no  Fact Sheet for Healthcare Providers: BankingDealers.co.za  This test is not yet approved or  cleared by the [place] FDA and has been authorized for detection and/or diagnosis of SARS-CoV-2 by FDA under an Emergency Use Authorization (EUA).  This EUA will remain in effect (meaning this test can be used) for the duration of  the COVID-19 declaration under Section 564(b)(1) of the Act, 21 U.S.C. section 360bbb-3(b)(1), unless the authorization is terminated or revoked sooner.  Performed at [facility], [address] [place], [place] [address]   CULTURE, BLOOD (ROUTINE X 2) w Reflex to ID Panel     Status: None   Collection Time: 01/23/20  5:13 PM   Specimen: BLOOD  Result Value Ref Range Status   Specimen  Description BLOOD BLOOD LEFT HAND  Final   Special Requests   Final    BOTTLES DRAWN AEROBIC AND ANAEROBIC Blood Culture adequate volume   Culture   Final    NO GROWTH 5 DAYS Performed at [facility], [address] [place], [place] [address]    Report Status 01/28/2020 FINAL  Final  CULTURE, BLOOD (ROUTINE X 2) w Reflex to ID Panel     Status: None   Collection Time: 01/23/20  6:12 PM   Specimen: BLOOD  Result Value Ref Range Status   Specimen Description BLOOD BLOOD LEFT WRIST  Final   Special Requests Blood Culture adequate volume  Final   Culture   Final    NO GROWTH 5 DAYS Performed at [facility], [address] [place], [place] [address]    Report Status 01/28/2020 FINAL  Final  MRSA PCR Screening     Status: Abnormal   Collection Time: 01/25/20  8:17 AM   Specimen: Nasopharyngeal  Result Value Ref Range Status   MRSA by PCR POSITIVE (A) NEGATIVE Final    Comment:        The GeneXpert MRSA Assay (FDA approved for NASAL specimens only), is one component of a comprehensive MRSA colonization surveillance program. It is not intended to diagnose MRSA infection nor to guide or monitor treatment for MRSA infections. RESULT CALLED TO, READ BACK BY AND VERIFIED WITH: MARIA [facility] 01/25/20 1945 SJL Performed at [facility], [address] [place], [place] [address]   Body fluid culture     Status: None (Preliminary result)   Collection Time: 01/30/20  2:40 PM   Specimen: PATH Cytology Pleural fluid  Result Value Ref Range Status    Specimen Description   Final    PLEURAL Performed at [facility], [address] [place], [place] [address]    Special Requests   Final    NONE Performed at [facility] [address] [place], [place] [address]    Gram Stain   Final    RARE WBC PRESENT, PREDOMINANTLY MONONUCLEAR NO ORGANISMS SEEN    Culture   Final    NO GROWTH 2 DAYS Performed at [facility], [place] [address], [place], [place] [address]    Report Status PENDING  Incomplete    Coagulation Studies: No results for input(s): LABPROT, INR in the last 72 hours.  Urinalysis: Recent Labs    01/31/20 1030  COLORURINE RED*  LABSPEC 1.030  PHURINE TEST NOT REPORTED DUE TO COLOR INTERFERENCE OF URINE PIGMENT  GLUCOSEU TEST NOT REPORTED DUE TO COLOR INTERFERENCE OF URINE PIGMENT*  HGBUR TEST NOT REPORTED DUE TO COLOR INTERFERENCE OF URINE PIGMENT*  BILIRUBINUR TEST NOT REPORTED DUE TO COLOR INTERFERENCE OF URINE PIGMENT*  KETONESUR TEST NOT REPORTED DUE TO COLOR INTERFERENCE OF URINE PIGMENT*  PROTEINUR TEST NOT REPORTED DUE TO COLOR INTERFERENCE OF URINE PIGMENT*  NITRITE TEST NOT REPORTED DUE TO COLOR INTERFERENCE OF URINE PIGMENT*  LEUKOCYTESUR TEST NOT REPORTED DUE TO COLOR INTERFERENCE OF URINE PIGMENT*      Imaging: DG Chest 1 View  Result Date: 01/30/2020 CLINICAL DATA:  Follow-up right thoracentesis EXAM: CHEST  1 VIEW COMPARISON:  01/29/2020 FINDINGS: Dual lead pacemaker unchanged. Cardiomegaly and aortic atherosclerosis. Marked improvement on the right with only a small amount of residual pleural density and mild volume loss in the right lower lobe. Persistent left effusion and left lower lung atelectasis. IMPRESSION: Marked radiographic improvement following right thoracentesis. Small amount  of remaining pleural fluid on the right with mild volume loss in the right lower lobe. No postprocedural pneumothorax. No change in left effusion with areas of atelectasis in the left lung.  Electronically Signed   By: Nelson Chimes M.D.   On: 01/30/2020 17:03   IR NEPHROSTOMY EXCHANGE RIGHT  Result Date: 01/31/2020 INDICATION: Decreased and bloody output from right nephrostomy tube. EXAM: RIGHT NEPHROSTOMY TUBE CHECK. COMPARISON:  ULTRASOUND 01/23/2020. Nephrostomy tube exchange 12/28/2019. MEDICATIONS: None. ANESTHESIA/SEDATION: None. CONTRAST:  Ten cc of nonionic-administered into the collecting system(s) FLUOROSCOPY TIME:  Fluoroscopy Time: 0 minutes 20 seconds (5.7 mGy). COMPLICATIONS: None immediate. PROCEDURE: Informed written consent was obtained from the patient after a thorough discussion of the procedural risks, benefits and alternatives. All questions were addressed. Maximal Sterile Barrier Technique was utilized including caps, mask, sterile gowns, sterile gloves, sterile drape, hand hygiene and skin antiseptic. A timeout was performed prior to the initiation of the procedure. Right nephrostomy tube in stable position. Nephrostomy tube was suction and Nonionic contrast was injected into the nephrostomy tube. Nephrostomy tube is widely patent. Nephrostomy tube in stable position within the renal collecting system. The tube was placed to gravity drainage. IMPRESSION: Right nephrostomy tube aspirated. Contrast administered demonstrating widely patent right nephrostomy tube in good and stable position. No tube exchange was needed. Electronically Signed   By: Marcello Moores  Register   On: 01/31/2020 15:23   US THORACENTESIS ASP PLEURAL SPACE W/IMG GUIDE  Result Date: 01/30/2020 INDICATION: 86 year old male with a history of pleural effusion, referred for thoracentesis EXAM: ULTRASOUND GUIDED RIGHT THORACENTESIS MEDICATIONS: None. COMPLICATIONS: None PROCEDURE: An ultrasound guided thoracentesis was thoroughly discussed with the patient and questions answered. The benefits, risks, alternatives and complications were also discussed. The patient understands and wishes to proceed with the procedure.  Written consent was obtained. Ultrasound was performed to localize and mark an adequate pocket of fluid in the right chest. The area was then prepped and draped in the normal sterile fashion. 1% Lidocaine was used for local anesthesia. Under ultrasound guidance a 8 Fr Safe-T-Centesis catheter was introduced. Thoracentesis was performed. The catheter was removed and a dressing applied. FINDINGS: A total of approximately 2400 cc of thin yellow fluid was removed. Samples were sent to the laboratory as requested by the clinical team. IMPRESSION: Status post ultrasound-guided right-sided thoracentesis. Electronically Signed   By: Corrie Mckusick D.O.   On: 01/30/2020 15:35     Medications:    sodium chloride Stopped (01/31/20 2237)   piperacillin-tazobactam (ZOSYN)  IV Stopped (02/01/20 0201)   vancomycin     vancomycin      ferrous sulfate  325 mg Oral Q breakfast   And   vitamin C  250 mg Oral Q breakfast   azelastine  1 spray Each Nare BID   Chlorhexidine Gluconate Cloth  6 each Topical Q0600   epoetin (EPOGEN/PROCRIT) injection  10,000 Units Intravenous Q T,Th,Sa-HD   feeding supplement (ENSURE ENLIVE)  237 mL Oral BID BM   finasteride  5 mg Oral Daily   insulin aspart  0-5 Units Subcutaneous QHS   insulin aspart  0-9 Units Subcutaneous TID WC   mouth rinse  15 mL Mouth Rinse BID   metoprolol tartrate  50 mg Oral BID   multivitamin  1 tablet Oral QHS   pantoprazole  40 mg Oral Daily   rosuvastatin  5 mg Oral QHS   sodium bicarbonate  650 mg Oral BID   tamsulosin  0.4 mg Oral Daily   sodium  chloride, acetaminophen, albuterol, ammonium lactate, metoprolol tartrate, ondansetron (ZOFRAN) IV  Assessment/ Plan:  Blake [name] is a 86 y.o. black male with acute renal failure on hemodialysis, hypertension, hyperlipidemia, diabetes mellitus type 2, GERD, obstructive sleep apnea, urothelial carcinoma of the bladder status post bilateral nephrostomy tube placement,  permanent pacemaker placement secondary to sick sinus syndrome, SVT, who is admitted to [facility] on 01/23/2020 for Weakness [R53.1] SOB (shortness of breath) [R06.02] Acute renal failure superimposed on stage 4 chronic kidney disease (HCC) [N17.9, N18.4] Acute renal failure superimposed on chronic kidney disease, unspecified CKD stage, unspecified acute renal failure type ([place]) [N17.9, N18.9] Patient initiated on hemodialysis on 7/17 for uremic symptoms.   1. End Stage Renal Disease: seen and examined on hemodialysis treatment - Remove temp HD catheter today so patient can participate with PT/OT.  - Plan on tunneled catheter for Monday. Case discussed with Dr. Lucky Cowboy.  - Continue sodium bicarbonate.   2. Anemia of chronic kidney disease.   - EPO with HD treatment.   3. Hypertension - metoprolol   4. Bilateral pleural effusions: status post Right thoracentesis on 7/21 with 2.4 liters removed.  Plan for left thoracentesis later today.    LOS: 9 Genese Quebedeaux 7/23/202111:28 AM

## 2020-02-01 NOTE — Progress Notes (Signed)
OT Cancellation Note  Patient Details Name: Blake [name] MRN: [id number] DOB: Jul 04, 1923   Cancelled Treatment:    Reason Eval/Treat Not Completed: Patient at procedure or test/ unavailable. OT continues to follow pt for evaluation. Upon arrival to pt room this date, pt being taken out of room for ultrasound, per staff. Will hold OT evaluation at this time at re-attempt at a later time/date as available and pt medically appropriate for OT evaluation.   Shara Blazing, M.S., OTR/L Ascom: [phone] 02/01/20, 1:20 PM

## 2020-02-01 NOTE — Progress Notes (Signed)
PT Cancellation Note  Patient Details Name: Blake [name] MRN: [id number] DOB: 09/22/1922   Cancelled Treatment:    Reason Eval/Treat Not Completed: Patient at procedure or test/unavailable.  PT consult received.  Chart reviewed.  1st attempt to see pt this morning pt was eating breakfast (with assist from staff) and 2nd attempt transport arrived to take pt to dialysis (pt not available for PT evaluation).  Will re-attempt PT evaluation at a later date/time as able.  Leitha Bleak, PT 02/01/20, 9:02 AM

## 2020-02-01 NOTE — Procedures (Signed)
Interventional Radiology Procedure Note  Procedure: [place] left thoracentesis  Complications: None  Estimated Blood Loss: min  Findings: 750cc removed, labs sent

## 2020-02-01 NOTE — Progress Notes (Signed)
Patient ID: [name], male   DOB: September 26, 1922, 86 y.o.   MRN: [id number] [facility] PROGRESS NOTE  JACKSON COFFIELD MBT:[id number] DOB: [birth year] DOA: 01/23/2020 PCP: Adin Hector, MD  HPI/Subjective: Patient more interactive today.  Answers some more questions and follows some commands.  Patient initially admitted with decreased urine output and found to have acute on chronic kidney disease stage IV.  Objective: Vitals:   02/01/20 1347 02/01/20 1406  BP: (!) 94/64 (!) 98/63  Pulse: 105 82  Resp:  20  Temp:    SpO2: 100% 100%    Intake/Output Summary (Last 24 hours) at 02/01/2020 1502 Last data filed at 02/01/2020 1150 Gross per 24 hour  Intake 316.65 ml  Output 505 ml  Net -188.35 ml   Filed Weights   01/30/20 0500 01/31/20 0500 02/01/20 0439  Weight: 97 kg 92.8 kg (!) 93.1 kg    ROS: Review of Systems  Unable to perform ROS: Acuity of condition  Respiratory: Positive for shortness of breath.   Cardiovascular: Negative for chest pain.  Gastrointestinal: Negative for abdominal pain.   Exam: Physical Exam HENT:     Head: Normocephalic.     Nose: No mucosal edema.     Mouth/Throat:     Pharynx: No oropharyngeal exudate.  Eyes:     General: Lids are normal.     Conjunctiva/sclera: Conjunctivae normal.     Pupils: Pupils are equal, round, and reactive to light.  Cardiovascular:     Rate and Rhythm: Normal rate and regular rhythm.     Heart sounds: Normal heart sounds, S1 normal and S2 normal.  Pulmonary:     Breath sounds: Examination of the left-middle field reveals decreased breath sounds. Examination of the right-lower field reveals decreased breath sounds and rhonchi. Examination of the left-lower field reveals decreased breath sounds and rhonchi. Decreased breath sounds and rhonchi present. No wheezing or rales.  Abdominal:     Palpations: Abdomen is soft.     Tenderness: There is no abdominal tenderness.  Musculoskeletal:     Right ankle: Swelling  present.     Left ankle: Swelling present.  Skin:    General: Skin is warm.     Findings: No rash.  Neurological:     Mental Status: He is alert and oriented to person, place, and time.       Data Reviewed: Basic Metabolic Panel: Recent Labs  Lab 01/26/20 0807 01/27/20 0451 01/27/20 0451 01/28/20 1017 01/28/20 1520 01/29/20 1030 01/31/20 0541 02/01/20 1048  NA  --  136   < > 135 135 138 139 137  K  --  4.0   < > 3.8 3.5 3.7 3.9 3.3*  CL  --  95*   < > 93* 96* 97* 99 97*  CO2  --  29   < > 26 25 28 28  33*  GLUCOSE  --  93   < > 97 132* 170* 130* 146*  BUN  --  95*   < > 103* 66* 67* 59* 34*  CREATININE  --  7.29*   < > 7.92* 5.88* 6.55* 6.46* 3.65*  CALCIUM  --  7.6*   < > 7.8* 7.7* 7.8* 7.8* 7.8*  MG  --   --   --   --   --  2.4  --   --   PHOS 8.6* 7.8*  --  8.3*  --  5.9*  --  2.7   < > = values in  this interval not displayed.   Liver Function Tests: Recent Labs  Lab 01/27/20 0451 01/28/20 1017 01/29/20 1030 02/01/20 1048  ALBUMIN 2.7* 2.7* 2.7* 2.5*   CBC: Recent Labs  Lab 01/27/20 0451 01/28/20 1017 01/29/20 1030 02/01/20 1048  WBC 4.6 5.1 5.7 7.9  HGB 9.2* 9.7* 9.5* 9.3*  HCT 28.3* 29.7* 29.4* 29.4*  MCV 87.9 87.9 88.6 90.2  PLT 60* 52* 52* 53*   BNP (last 3 results) Recent Labs    09/24/19 1530 01/23/20 0948 01/29/20 1046  BNP 982.0* 2,109.0* 4,446.4*     CBG: Recent Labs  Lab 01/31/20 0804 01/31/20 1150 01/31/20 1642 01/31/20 2227 02/01/20 0740  GLUCAP 119* 125* 98 150* 109*    Recent Results (from the past 240 hour(s))  Urine culture     Status: Abnormal   Collection Time: 01/23/20  9:40 AM   Specimen: Urine, Random  Result Value Ref Range Status   Specimen Description   Final    URINE, RANDOM Performed at [facility], [address] [place], [place] [address]    Special Requests   Final    NONE Performed at [facility], [place], [place] [address]    Culture 70,000 COLONIES/mL  STAPHYLOCOCCUS EPIDERMIDIS (A)  Final   Report Status 01/26/2020 FINAL  Final   Organism ID, Bacteria STAPHYLOCOCCUS EPIDERMIDIS (A)  Final      Susceptibility   Staphylococcus epidermidis - MIC*    CIPROFLOXACIN >=8 RESISTANT Resistant     GENTAMICIN <=0.5 SENSITIVE Sensitive     NITROFURANTOIN <=16 SENSITIVE Sensitive     OXACILLIN >=4 RESISTANT Resistant     TETRACYCLINE 2 SENSITIVE Sensitive     VANCOMYCIN 1 SENSITIVE Sensitive     TRIMETH/SULFA 80 RESISTANT Resistant     CLINDAMYCIN <=0.25 SENSITIVE Sensitive     RIFAMPIN <=0.5 SENSITIVE Sensitive     Inducible Clindamycin NEGATIVE Sensitive     * 70,000 COLONIES/mL STAPHYLOCOCCUS EPIDERMIDIS  SARS Coronavirus 2 by RT PCR (hospital order, performed in [place] hospital lab) Nasopharyngeal Nasopharyngeal Swab     Status: None   Collection Time: 01/23/20  4:00 PM   Specimen: Nasopharyngeal Swab  Result Value Ref Range Status   SARS Coronavirus 2 NEGATIVE NEGATIVE Final    Comment: (NOTE) SARS-CoV-2 target nucleic acids are NOT DETECTED.  The SARS-CoV-2 RNA is generally detectable in upper and lower respiratory specimens during the acute phase of infection. The lowest concentration of SARS-CoV-2 viral copies this assay can detect is 250 copies / mL. A negative result does not preclude SARS-CoV-2 infection and should not be used as the sole basis for treatment or other patient management decisions.  A negative result may occur with improper specimen collection / handling, submission of specimen other than nasopharyngeal swab, presence of viral mutation(s) within the areas targeted by this assay, and inadequate number of viral copies (<250 copies / mL). A negative result must be combined with clinical observations, patient history, and epidemiological information.  Fact Sheet for Patients:   StrictlyIdeas.no  Fact Sheet for Healthcare Providers: BankingDealers.co.za  This test  is not yet approved or  cleared by the [place] FDA and has been authorized for detection and/or diagnosis of SARS-CoV-2 by FDA under an Emergency Use Authorization (EUA).  This EUA will remain in effect (meaning this test can be used) for the duration of the COVID-19 declaration under Section 564(b)(1) of the Act, 21 U.S.C. section 360bbb-3(b)(1), unless the authorization is terminated or revoked sooner.  Performed at [facility], [address] [place], [place] [address]   CULTURE, BLOOD (ROUTINE X 2) w Reflex to ID Panel     Status: None   Collection Time: 01/23/20  5:13 PM   Specimen: BLOOD  Result Value Ref Range Status   Specimen Description BLOOD BLOOD LEFT HAND  Final   Special Requests   Final    BOTTLES DRAWN AEROBIC AND ANAEROBIC Blood Culture adequate volume   Culture   Final    NO GROWTH 5 DAYS Performed at [facility], [address] [place], [place] [address]    Report Status 01/28/2020 FINAL  Final  CULTURE, BLOOD (ROUTINE X 2) w Reflex to ID Panel     Status: None   Collection Time: 01/23/20  6:12 PM   Specimen: BLOOD  Result Value Ref Range Status   Specimen Description BLOOD BLOOD LEFT WRIST  Final   Special Requests Blood Culture adequate volume  Final   Culture   Final    NO GROWTH 5 DAYS Performed at [facility], [address] [place], [place] [address]    Report Status 01/28/2020 FINAL  Final  MRSA PCR Screening     Status: Abnormal   Collection Time: 01/25/20  8:17 AM   Specimen: Nasopharyngeal  Result Value Ref Range Status   MRSA by PCR POSITIVE (A) NEGATIVE Final    Comment:        The GeneXpert MRSA Assay (FDA approved for NASAL specimens only), is one component of a comprehensive MRSA colonization surveillance program. It is not intended to diagnose MRSA infection nor to guide or monitor treatment for MRSA infections. RESULT CALLED TO, READ BACK BY AND VERIFIED WITH: [facility] 01/25/20 1945  SJL Performed at [facility], [address] [place], [place] [address]   Body fluid culture     Status: None (Preliminary result)   Collection Time: 01/30/20  2:40 PM   Specimen: PATH Cytology Pleural fluid  Result Value Ref Range Status   Specimen Description   Final    PLEURAL Performed at [facility], [address], [place], [place] [address]    Special Requests   Final    NONE Performed at [facility], [address] [place], [place] [address]    Gram Stain   Final    RARE WBC PRESENT, PREDOMINANTLY MONONUCLEAR NO ORGANISMS SEEN    Culture   Final    NO GROWTH 2 DAYS Performed at [facility], [place] [address], [place], [place] [address]    Report Status PENDING  Incomplete  Urine Culture     Status: Abnormal   Collection Time: 01/31/20 10:30 AM   Specimen: Urine, Random  Result Value Ref Range Status   Specimen Description   Final    URINE, RANDOM Performed at [facility], [address], [place], [place] [address]    Special Requests   Final    Normal Performed at [facility], [address] [place], [place] [address]    Culture MULTIPLE SPECIES PRESENT, SUGGEST RECOLLECTION (A)  Final   Report Status 02/01/2020 FINAL  Final     Studies: DG Chest 1 View  Result Date: 01/30/2020 CLINICAL DATA:  Follow-up right thoracentesis EXAM: CHEST  1 VIEW COMPARISON:  01/29/2020 FINDINGS: Dual lead pacemaker unchanged. Cardiomegaly and aortic atherosclerosis. Marked improvement on the right with only a small amount of residual pleural density and mild volume loss in the right lower  lobe. Persistent left effusion and left lower lung atelectasis. IMPRESSION: Marked radiographic improvement following right thoracentesis. Small amount of remaining pleural fluid on the right with mild volume loss in the right lower lobe. No postprocedural pneumothorax. No change in left effusion with areas of atelectasis in the left lung.  Electronically Signed   By: Nelson Chimes M.D.   On: 01/30/2020 17:03   IR NEPHROSTOMY EXCHANGE RIGHT  Result Date: 01/31/2020 INDICATION: Decreased and bloody output from right nephrostomy tube. EXAM: RIGHT NEPHROSTOMY TUBE CHECK. COMPARISON:  ULTRASOUND 01/23/2020. Nephrostomy tube exchange 12/28/2019. MEDICATIONS: None. ANESTHESIA/SEDATION: None. CONTRAST:  Ten cc of nonionic-administered into the collecting system(s) FLUOROSCOPY TIME:  Fluoroscopy Time: 0 minutes 20 seconds (5.7 mGy). COMPLICATIONS: None immediate. PROCEDURE: Informed written consent was obtained from the patient after a thorough discussion of the procedural risks, benefits and alternatives. All questions were addressed. Maximal Sterile Barrier Technique was utilized including caps, mask, sterile gowns, sterile gloves, sterile drape, hand hygiene and skin antiseptic. A timeout was performed prior to the initiation of the procedure. Right nephrostomy tube in stable position. Nephrostomy tube was suction and Nonionic contrast was injected into the nephrostomy tube. Nephrostomy tube is widely patent. Nephrostomy tube in stable position within the renal collecting system. The tube was placed to gravity drainage. IMPRESSION: Right nephrostomy tube aspirated. Contrast administered demonstrating widely patent right nephrostomy tube in good and stable position. No tube exchange was needed. Electronically Signed   By: Marcello Moores  Register   On: 01/31/2020 15:23   US THORACENTESIS ASP PLEURAL SPACE W/IMG GUIDE  Result Date: 02/01/2020 INDICATION: Pleural effusions, shortness of breath EXAM: ULTRASOUND GUIDED LEFT THORACENTESIS MEDICATIONS: 1% lidocaine local COMPLICATIONS: None immediate. PROCEDURE: An ultrasound guided thoracentesis was thoroughly discussed with the patient and questions answered. The benefits, risks, alternatives and complications were also discussed. The patient understands and wishes to proceed with the procedure. Written consent  was obtained. Ultrasound was performed to localize and mark an adequate pocket of fluid in the left chest. The area was then prepped and draped in the normal sterile fashion. 1% Lidocaine was used for local anesthesia. Under ultrasound guidance a 6 Fr Safe-T-Centesis catheter was introduced. Thoracentesis was performed. The catheter was removed and a dressing applied. FINDINGS: A total of approximately 750 cc of serosanguineous pleural fluid was removed. Samples were sent to the laboratory as requested by the clinical team. IMPRESSION: Successful ultrasound guided left thoracentesis yielding 750 cc of pleural fluid. Electronically Signed   By: Jerilynn Mages.  Shick M.D.   On: 02/01/2020 14:52    Scheduled Meds: . ferrous sulfate  325 mg Oral Q breakfast   And  . vitamin C  250 mg Oral Q breakfast  . azelastine  1 spray Each Nare BID  . Chlorhexidine Gluconate Cloth  6 each Topical Q0600  . epoetin (EPOGEN/PROCRIT) injection  10,000 Units Intravenous Q T,Th,Sa-HD  . feeding supplement (ENSURE ENLIVE)  237 mL Oral BID BM  . finasteride  5 mg Oral Daily  . insulin aspart  0-5 Units Subcutaneous QHS  . insulin aspart  0-9 Units Subcutaneous TID WC  . mouth rinse  15 mL Mouth Rinse BID  . metoprolol tartrate  50 mg Oral BID  . multivitamin  1 tablet Oral QHS  . pantoprazole  40 mg Oral Daily  . rosuvastatin  5 mg Oral QHS  . sodium bicarbonate  650 mg Oral BID  . tamsulosin  0.4 mg Oral Daily    Assessment/Plan:   1. End-stage renal disease. Came  in with acute kidney injury on chronic kidney disease stage IV.  Nephrology did dialysis through temporary catheter in groin.  Nephrology to order temporary catheter to be removed today.  Hopefully PermCath on Monday. 2. Purulent drainage right nephrostomy tube.  Interventional radiology looked at the tube yesterday but did not replace it.  I started empiric antibiotics vancomycin and Zosyn.  We will continue that for right now. 3. Bilateral large pleural  effusions. Right thoracentesis removed 2400 mL on 01/30/2020.  Left thoracentesis removed 750 mL on 02/01/2020. 4. SVT and persistent tachycardia.  Last heart rate better at 80 listed in the computer.  I increased metoprolol to 50 mg twice a day yesterday.  Blood pressure on the lower side. 5. History of urothelial carcinoma the bladder.  History of prostate cancer.  Status post bilateral nephrostomy tubes.  On Proscar and Flomax. 6. Acute hypoxic respiratory failure.  Continue oxygen supplementation.  Currently on 4 L. 7. Acute on chronic diastolic congestive heart failure.  BNP elevated and has bilateral pleural effusions.  Patient has had bilateral thoracentesis on this admission. 8. Type 2 diabetes mellitus with renal manifestations 9. Anemia of chronic disease 10. Chronic decubitus ulcer present on admission, stage II. 11. Palliative care consultation appreciated. 12. Weakness.  Physical therapy evaluation appreciated    Code Status:     Code Status Orders  (From admission, onward)         Start     Ordered   01/23/20 1517  Full code  Continuous        01/23/20 1516        Code Status History    Date Active Date Inactive Code Status Order ID Comments User Context   11/04/2019 0118 11/05/2019 2145 Full Code [id number]  Neena Rhymes, MD ED   11/04/2019 0054 11/04/2019 0118 DNR [id number]  Neena Rhymes, MD ED   09/24/2019 2132 09/28/2019 2309 Full Code [id number]  Orene Desanctis, DO ED   01/03/2019 0142 01/09/2019 2238 Full Code [id number]  Mansy, Arvella Merles, MD ED   12/25/2018 1233 01/02/2019 1950 Full Code [id number]  Dustin Flock, MD Inpatient   12/25/2018 0645 12/25/2018 1233 Full Code [id number]  Paulette Blanch, MD ED   11/21/2018 2217 11/29/2018 2052 Full Code [id number]  Mayer Camel, NP ED   11/05/2018 1755 11/09/2018 1728 Full Code [id number]  Mayo, Pete Pelt, MD Inpatient   09/10/2017 1430 09/13/2017 2115 Full Code [id number]  Idelle Crouch, MD Inpatient   06/10/2017 2304 06/12/2017 1908  Full Code [id number]  Lance Coon, MD Inpatient   05/17/2017 0829 05/19/2017 1525 Full Code [id number]  Hillary Bow, MD ED   03/31/2017 1334 04/03/2017 1611 Full Code [id number]  Nicholes Mango, MD Inpatient   10/21/2016 0155 10/25/2016 2145 Full Code [id number]  Hugelmeyer, [place], DO Inpatient   10/09/2016 2342 10/13/2016 2009 Full Code [id number]  Vaughan Basta, MD Inpatient   04/14/2015 1432 04/15/2015 1739 Full Code [id number]  Isaias Cowman, MD Inpatient   04/09/2015 0022 04/14/2015 1432 Full Code [id number]  Hower, Aaron Mose, MD ED   Advance Care Planning Activity     Family Communication: Spoke with Dr. Primus Bravo on the phone today to give update. Disposition Plan: Status is: Inpatient  Dispo: The patient is from: Home              Anticipated d/c is to: Home              Anticipated d/c date is: PermCath hopefully on Monday.  Earliest potential disposition will be on Tuesday or Wednesday depending on clinical course.              Patient with purulent drainage with the right nephrostomy tube.  Antibiotics started.  PermCath will be delayed till Monday secondary to infection.  Consultants:  Nephrology  Procedures:  Temporary dialysis catheter  Thoracentesis right on 01/30/2020  Thoracentesis left 02/01/2020  Time spent: 27 minutes. Case discussed with nephrology.  [place]  [facility] [employer]

## 2020-02-01 NOTE — Consult Note (Signed)
Pharmacy Antibiotic Note  Blake [name] is a 86 y.o. male  admitted on 01/23/2020 with a UTI (previously treated to completion) with a history of CKD and a right nephrostomy tube that progressed during this admission to HD. Dr Leslye Peer has started antibiotic treatment for purulent drainage from his nephrostomy tube (scheduled to be replaced). Pharmacy was consulted for Vancomycin/Zosyn dosing. He received a 1750 mg vancomycin loading dose yesterday and received HD today. His temp HD cath is scheduled to be removed today and a tunneled catheter is scheduled to be placed on Monday 7/26  Plan:  1) vancomycin   vancomycin 1000mg  following HD today  This is the 1st  Post-HD vancomycin dose  vancomycin level prior to 3rd HD session  Goal pre-HD level:  15 - 25 mcg/mL  2) Zosyn   Continue Zosyn 2.25 grams IV every 8 hours  Height: 6' (182.9 cm) Weight: (!) 93.1 kg (205 lb 4 oz) IBW/kg (Calculated) : 77.6  Temp (24hrs), Avg:97.7 F (36.5 C), Min:97.2 F (36.2 C), Max:97.9 F (36.6 C)  Recent Labs  Lab 01/27/20 0451 01/28/20 1017 01/28/20 1520 01/29/20 1030 01/31/20 0541  WBC 4.6 5.1  --  5.7  --   CREATININE 7.29* 7.92* 5.88* 6.55* 6.46*    Estimated Creatinine Clearance: 7.5 mL/min (A) (by C-G formula based on SCr of 6.46 mg/dL (H)).    Allergies  Allergen Reactions   Cefepime     seizure   Diovan [Valsartan] Cough   Gabapentin Other (See Comments)    Sedation at all doses   Hydralazine Itching   Lisinopril Cough   Pregabalin Other (See Comments)    Sedation at all doses   Levofloxacin Other (See Comments)    Too many side effects. Nausea, vomiting, upset stomach, increased confusion, etc Other reaction(s): Confusion Nausea, chills Nausea, chills    Sulfamethoxazole-Trimethoprim Other (See Comments)    Too many side effects. Nausea, vomiting, upset stomach, increased confusion, etc    Antimicrobials this admission: Zosyn 7/22 >> Vancomycin 7/22  >>  Microbiology results: 7/21 WCx(pleural fluid): NG x 2 days 7/22 UCx: pending 7/14 BCx: NG final 7/14 UCx Staph Epi 70K (treated w 3 days aztreonam) 7/16 MRSA PCR: positive 7/14 SARS CoV-2: negative  Thank you for allowing pharmacy to be a part of this patients care.  Dallie Piles, PharmD, BCPS Clinical Pharmacist 02/01/2020 7:02 AM

## 2020-02-01 NOTE — Progress Notes (Signed)
PT Cancellation Note  Patient Details Name: Blake [name] MRN: [id number] DOB: October 24, 1922   Cancelled Treatment:    Reason Eval/Treat Not Completed: Patient at procedure or test/unavailable.  Pt currently not in room (off floor for procedure).  Will re-attempt PT evaluation at a later date/time as medically appropriate.  Leitha Bleak, PT 02/01/20, 2:17 PM

## 2020-02-02 DIAGNOSIS — I4891 Unspecified atrial fibrillation: Secondary | ICD-10-CM | POA: Diagnosis not present

## 2020-02-02 DIAGNOSIS — D696 Thrombocytopenia, unspecified: Secondary | ICD-10-CM

## 2020-02-02 DIAGNOSIS — I4729 Other ventricular tachycardia: Secondary | ICD-10-CM

## 2020-02-02 DIAGNOSIS — J90 Pleural effusion, not elsewhere classified: Secondary | ICD-10-CM | POA: Diagnosis not present

## 2020-02-02 DIAGNOSIS — N186 End stage renal disease: Secondary | ICD-10-CM | POA: Diagnosis not present

## 2020-02-02 DIAGNOSIS — T148XXA Other injury of unspecified body region, initial encounter: Secondary | ICD-10-CM | POA: Diagnosis not present

## 2020-02-02 DIAGNOSIS — I472 Ventricular tachycardia: Secondary | ICD-10-CM

## 2020-02-02 LAB — GLUCOSE, CAPILLARY
Glucose-Capillary: 100 mg/dL — ABNORMAL HIGH (ref 70–99)
Glucose-Capillary: 170 mg/dL — ABNORMAL HIGH (ref 70–99)
Glucose-Capillary: 159 mg/dL — ABNORMAL HIGH (ref 70–99)
Glucose-Capillary: 145 mg/dL — ABNORMAL HIGH (ref 70–99)

## 2020-02-02 MED ORDER — METOPROLOL TARTRATE 25 MG PO TABS
25.0000 mg | ORAL_TABLET | Freq: Two times a day (BID) | ORAL | Status: DC
  Filled 2020-02-02: qty 1

## 2020-02-02 MED ORDER — METOPROLOL TARTRATE 50 MG PO TABS
50.0000 mg | ORAL_TABLET | Freq: Two times a day (BID) | ORAL | Status: DC
  Administered 2020-02-02 – 2020-02-11 (×18): 50 mg via ORAL
  Filled 2020-02-02 (×18): qty 1

## 2020-02-02 NOTE — Evaluation (Signed)
Occupational Therapy Evaluation Patient Details Name: Blake [name] MRN: [id number] DOB: January 15, 1923 Today's Date: 02/02/2020    History of Present Illness [name] is a 86 y.o. 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  Right thoracentesis 7/21 2400 mL removed.  Pt currently has temp femoral cath limiting activity to bedlevel only, no hip flexion or positioning at EOB.   Clinical Impression   Mr. Cuff presents to OT with poor endurance, generalized weakness, and limited activity tolerance that impacts his ability to safely and independently complete functional tasks.  Pt was very lethargic in today's session, which limited ability for OT to provide treatment to pt.  Pt was unable to provide information for occupational profile 2/2 lethargy and disorientation, but pt's son was present to provide information re: home setup and PLOF.  Pt was able to wake with verbal and tactile cues, and answer simple questions via head nod, but was unable to fully attend to tasks.  Pt is oriented to self and able to orient to setting/situation/date with min assist.  Prior to admission, pt's family and caretakers provided assistance for dressing, feeding, and household mobility with SW.  Pt was largely dependent for bathing and grooming, as well as all IADLs.  Currently, pt is grossly dependent for all BADLs including feeding, toileting, dressing, bathing, and grooming.  Pt's activity is limited to bedlevel 2/2 temporary femoral cath placement, but suspect pt would still require large amount of assist without activity restrictions.  OTR attempted  to engage pt in UB strengthening exercises with theraband to maintain UB strength and activity tolerance, but was pt was unable to participate 2/2 lethargy.  [name] will continue to benefit from skilled OT services in acute setting to address further ADL assessment, functional strengthening, endurance, and safety and independence in ADLs.  While pt's family continues to make decisions for most appropriate plan of care with palliative care, suspect HHOT is most appropriate discharge recommendation at this time.    Follow Up Recommendations  Home health OT;Supervision/Assistance - 24 hour;Other (comment) (pending palliative recommendations/family decisions)    Equipment Recommendations  Hospital bed    Recommendations for Other Services       Precautions / Restrictions Precautions Precautions: Fall Restrictions Weight Bearing Restrictions: No Other Position/Activity Restrictions: temp formal cath- bedlevel activity only      Mobility Bed Mobility Overal bed mobility: Needs Assistance             General bed mobility comments: unable to assess 2/2 activity restrictions  Transfers Overall transfer level: Needs assistance               General transfer comment: unable to assess 2/2 activity restrictions    Balance Overall balance assessment: Needs assistance     Sitting balance - Comments: unable to assess       Standing balance comment: unable to assess                           ADL either performed or assessed with clinical judgement   ADL Overall ADL's : Needs assistance/impaired  General ADL Comments: Pt is grossly dependent for all ADLs, including feeding, dressing, grooming, toileting, and bathing.  Pt's activity is limited to bedlevel, but suspect he would require same level of assist without activity restrictions.     Vision Baseline Vision/History: Macular Degeneration Patient Visual  Report: No change from baseline (pt with chronic macular degeneration and low vision, but has no acute changes)       Perception     Praxis      Pertinent Vitals/Pain Pain Assessment: No/denies pain     Hand Dominance Right   Extremity/Trunk Assessment Upper Extremity Assessment Upper Extremity Assessment: Generalized weakness (unable to fully assess 2/2 lethargy)   Lower Extremity Assessment Lower Extremity Assessment: Defer to PT evaluation;Difficult to assess due to impaired cognition (unable to assess 2/2 temp femoral cath placement)       Communication Communication Communication: No difficulties   Cognition Arousal/Alertness: Lethargic Behavior During Therapy: [facility] for tasks assessed/performed;Flat affect Overall Cognitive Status: Difficult to assess                                 General Comments: Pt was lethargic throughout session, making cognition difficult to assess.  Pt's son reports he is most alert in the mornings, and is generally oriented to self.  He is able to orient to setting, situation, date with min assist.   General Comments       Exercises Other Exercises Other Exercises: provided education to pt and pt's son re: OT role and plan of care, self care, equipment/DME needs, UB strengthening exercises   Shoulder Instructions      Home Living Family/patient expects to be discharged to:: Private residence Living Arrangements: Children;Spouse/significant other (lives with wife and daughter) Available Help at Discharge: Family;Available 24 hours/day;Personal care attendant (pt's family has been providing 24/7 care from caretakers during day and pt's daughter being home at night) Type of Home: House Home Access: Ramped entrance     Home Layout: Multi-level;Able to live on main level with bedroom/bathroom     Bathroom Shower/Tub: Occupational psychologist: Standard     Home Equipment: Environmental consultant - 4 wheels;Cane - single  point;Bedside commode;Shower seat - built in;Wheelchair - manual;Walker - standard;Other (comment) (Hoyer lift (has not used yet))   Additional Comments: Pt has BSC over toilet.  Pt also has Hoyer lift that has not been used yet.      Prior Functioning/Environment Level of Independence: Needs assistance  Gait / Transfers Assistance Needed: Pt's family or caretaker assists pt when he performs household mobility with SW ADL's / Homemaking Assistance Needed: Patient required assistance with all BADLs.  Pt able to ambulate to toilet and dress with assistance, is otherwise largely dependent for all ADLs including feeding, grooming, and bathing.  Pt's caretakers provide assistance with household management, meal prep, and medications. Communication / Swallowing Assistance Needed: Pt currently on thin liquid diet, is largely dependent for self-feeding Comments: Chronic macular degeneration        OT Problem List: Decreased strength;Decreased range of motion;Decreased activity tolerance;Impaired balance (sitting and/or standing);Impaired vision/perception;Decreased coordination;Decreased cognition;Decreased safety awareness;Decreased knowledge of use of DME or AE      OT Treatment/Interventions: DME and/or AE instruction;Self-care/ADL training;Therapeutic exercise;Energy conservation;Therapeutic activities;Cognitive remediation/compensation;Visual/perceptual remediation/compensation;Patient/family education;Balance training    OT Goals(Current goals can be found in the care plan section) Acute Rehab OT Goals Patient Stated Goal: pt unable to state goal OT Goal Formulation: Patient  unable to participate in goal setting Time For Goal Achievement: 02/16/20 Potential to Achieve Goals: Fair  OT Frequency: Min 1X/week   Barriers to D/C:            Co-evaluation              AM-PAC OT "6 Clicks" Daily Activity     Outcome Measure Help from another person eating meals?: A Lot Help from  another person taking care of personal grooming?: A Lot Help from another person toileting, which includes using toliet, bedpan, or urinal?: Total Help from another person bathing (including washing, rinsing, drying)?: Total Help from another person to put on and taking off regular upper body clothing?: A Lot Help from another person to put on and taking off regular lower body clothing?: Total 6 Click Score: 9   End of Session    Activity Tolerance: Patient limited by fatigue Patient left: in bed;with call bell/phone within reach;with bed alarm set;with nursing/sitter in room;with family/visitor present  OT Visit Diagnosis: Other abnormalities of gait and mobility (R26.89);Muscle weakness (generalized) (M62.81);Other symptoms and signs involving cognitive function                Time: 3335-4562 OT Time Calculation (min): 23 min Charges:  OT General Charges $OT Visit: 1 Visit OT Evaluation $OT Eval Moderate Complexity: 1 Mod OT Treatments $Self Care/Home Management : 8-22 mins  Myrtie Hawk Shanyla Marconi, OTR/L 02/02/20, 12:25 PM

## 2020-02-02 NOTE — Progress Notes (Signed)
[facility]  ROUNDING NOTE   Subjective:  Patient seen and evaluated at bedside. Underwent dialysis treatment yesterday. Son also at bedside.    Objective:  Vital signs in last 24 hours:  Temp:  [97.5 F (36.4 C)-98.1 F (36.7 C)] 97.8 F (36.6 C) (07/24 1202) Pulse Rate:  [64-122] 117 (07/24 1202) Resp:  [16-20] 18 (07/24 1202) BP: (100-132)/(66-82) 108/80 (07/24 1202) SpO2:  [95 %-100 %] 99 % (07/24 1202) Weight:  [92.5 kg] 92.5 kg (07/24 0544)  Weight change: -0.6 kg Filed Weights   01/31/20 0500 02/01/20 0439 02/02/20 0544  Weight: 92.8 kg (!) 93.1 kg (!) 92.5 kg    Intake/Output: I/O last 3 completed shifts: In: 773.9 [P.O.:480; I.V.:22.4; IV Piggyback:271.5] Out: 595 [Urine:196; Other:399]   Intake/Output this shift:  No intake/output data recorded.  Physical Exam: General: NAD   Head: Temporal wasting noted  Eyes: Anicteric  Neck: Supple, trachea midline  Lungs:  +rhonchi bilaterally, normal effort  Heart: irregular   Abdomen:  Soft, nontender, bowel sounds present  Extremities: Trace LE edema  Neurologic: Awake, alert, will answer yes/no questions  Skin: No lesions  Access: No access    Basic Metabolic Panel: Recent Labs  Lab 01/27/20 0451 01/27/20 0451 01/28/20 1017 01/28/20 1017 01/28/20 1520 01/28/20 1520 01/29/20 1030 01/31/20 0541 02/01/20 1048  NA 136   < > 135  --  135  --  138 139 137  K 4.0   < > 3.8  --  3.5  --  3.7 3.9 3.3*  CL 95*   < > 93*  --  96*  --  97* 99 97*  CO2 29   < > 26  --  25  --  28 28 33*  GLUCOSE 93   < > 97  --  132*  --  170* 130* 146*  BUN 95*   < > 103*  --  66*  --  67* 59* 34*  CREATININE 7.29*   < > 7.92*  --  5.88*  --  6.55* 6.46* 3.65*  CALCIUM 7.6*   < > 7.8*   < > 7.7*   < > 7.8* 7.8* 7.8*  MG  --   --   --   --   --   --  2.4  --   --   PHOS 7.8*  --  8.3*  --   --   --  5.9*  --  2.7   < > = values in this interval not displayed.    Liver Function Tests: Recent Labs  Lab  01/27/20 0451 01/28/20 1017 01/29/20 1030 02/01/20 1048  ALBUMIN 2.7* 2.7* 2.7* 2.5*   No results for input(s): LIPASE, AMYLASE in the last 168 hours. No results for input(s): AMMONIA in the last 168 hours.  CBC: Recent Labs  Lab 01/27/20 0451 01/28/20 1017 01/29/20 1030 02/01/20 1048  WBC 4.6 5.1 5.7 7.9  HGB 9.2* 9.7* 9.5* 9.3*  HCT 28.3* 29.7* 29.4* 29.4*  MCV 87.9 87.9 88.6 90.2  PLT 60* 52* 52* 53*    Cardiac Enzymes: No results for input(s): CKTOTAL, CKMB, CKMBINDEX, TROPONINI in the last 168 hours.  BNP: Invalid input(s): POCBNP  CBG: Recent Labs  Lab 02/01/20 0740 02/01/20 1533 02/01/20 2122 02/02/20 0750 02/02/20 1204  GLUCAP 109* 124* 163* 100* 170*    Microbiology: Results for orders placed or performed during the hospital encounter of 01/23/20  Urine culture     Status: Abnormal   Collection Time: 01/23/20  9:40 AM   Specimen: Urine, Random  Result Value Ref Range Status   Specimen Description   Final    URINE, RANDOM Performed at [facility], [address] [place], [place] [address]    Special Requests   Final    NONE Performed at [facility], [place], [place] [address]    Culture 70,000 COLONIES/mL STAPHYLOCOCCUS EPIDERMIDIS (A)  Final   Report Status 01/26/2020 FINAL  Final   Organism ID, Bacteria STAPHYLOCOCCUS EPIDERMIDIS (A)  Final      Susceptibility   Staphylococcus epidermidis - MIC*    CIPROFLOXACIN >=8 RESISTANT Resistant     GENTAMICIN <=0.5 SENSITIVE Sensitive     NITROFURANTOIN <=16 SENSITIVE Sensitive     OXACILLIN >=4 RESISTANT Resistant     TETRACYCLINE 2 SENSITIVE Sensitive     VANCOMYCIN 1 SENSITIVE Sensitive     TRIMETH/SULFA 80 RESISTANT Resistant     CLINDAMYCIN <=0.25 SENSITIVE Sensitive     RIFAMPIN <=0.5 SENSITIVE Sensitive     Inducible Clindamycin NEGATIVE Sensitive     * 70,000 COLONIES/mL STAPHYLOCOCCUS EPIDERMIDIS  SARS Coronavirus 2 by RT PCR (hospital order,  performed in [facility] hospital lab) Nasopharyngeal Nasopharyngeal Swab     Status: None   Collection Time: 01/23/20  4:00 PM   Specimen: Nasopharyngeal Swab  Result Value Ref Range Status   SARS Coronavirus 2 NEGATIVE NEGATIVE Final    Comment: (NOTE) SARS-CoV-2 target nucleic acids are NOT DETECTED.  The SARS-CoV-2 RNA is generally detectable in upper and lower respiratory specimens during the acute phase of infection. The lowest concentration of SARS-CoV-2 viral copies this assay can detect is 250 copies / mL. A negative result does not preclude SARS-CoV-2 infection and should not be used as the sole basis for treatment or other patient management decisions.  A negative result may occur with improper specimen collection / handling, submission of specimen other than nasopharyngeal swab, presence of viral mutation(s) within the areas targeted by this assay, and inadequate number of viral copies (<250 copies / mL). A negative result must be combined with clinical observations, patient history, and epidemiological information.  Fact Sheet for Patients:   StrictlyIdeas.no  Fact Sheet for Healthcare Providers: BankingDealers.co.za  This test is not yet approved or  cleared by the [place] FDA and has been authorized for detection and/or diagnosis of SARS-CoV-2 by FDA under an Emergency Use Authorization (EUA).  This EUA will remain in effect (meaning this test can be used) for the duration of the COVID-19 declaration under Section 564(b)(1) of the Act, 21 U.S.C. section 360bbb-3(b)(1), unless the authorization is terminated or revoked sooner.  Performed at [facility], [address] [place], [place] [address]   CULTURE, BLOOD (ROUTINE X 2) w Reflex to ID Panel     Status: None   Collection Time: 01/23/20  5:13 PM   Specimen: BLOOD  Result Value Ref Range Status   Specimen Description BLOOD BLOOD LEFT HAND  Final    Special Requests   Final    BOTTLES DRAWN AEROBIC AND ANAEROBIC Blood Culture adequate volume   Culture   Final    NO GROWTH 5 DAYS Performed at [facility], [address] [place], [place] [address]    Report Status 01/28/2020 FINAL  Final  CULTURE, BLOOD (ROUTINE X 2) w Reflex to ID Panel     Status: None   Collection Time: 01/23/20  6:12 PM   Specimen: BLOOD  Result Value Ref Range  Status   Specimen Description BLOOD BLOOD LEFT WRIST  Final   Special Requests Blood Culture adequate volume  Final   Culture   Final    NO GROWTH 5 DAYS Performed at [facility], [address] [place], [place] [address]    Report Status 01/28/2020 FINAL  Final  MRSA PCR Screening     Status: Abnormal   Collection Time: 01/25/20  8:17 AM   Specimen: Nasopharyngeal  Result Value Ref Range Status   MRSA by PCR POSITIVE (A) NEGATIVE Final    Comment:        The GeneXpert MRSA Assay (FDA approved for NASAL specimens only), is one component of a comprehensive MRSA colonization surveillance program. It is not intended to diagnose MRSA infection nor to guide or monitor treatment for MRSA infections. RESULT CALLED TO, READ BACK BY AND VERIFIED WITH: MARIA [facility] 01/25/20 1945 SJL Performed at [facility], [address] [place], [place] [address]   Body fluid culture     Status: None (Preliminary result)   Collection Time: 01/30/20  2:40 PM   Specimen: PATH Cytology Pleural fluid  Result Value Ref Range Status   Specimen Description   Final    PLEURAL Performed at [facility], [address] [place], [place] [address]    Special Requests   Final    NONE Performed at [facility], [address] [place], [place] [address]    Gram Stain   Final    RARE WBC PRESENT, PREDOMINANTLY MONONUCLEAR NO ORGANISMS SEEN    Culture   Final    NO GROWTH 3 DAYS Performed at [facility], [place] [address] [place], [place] [address]     Report Status PENDING  Incomplete  Urine Culture     Status: Abnormal   Collection Time: 01/31/20 10:30 AM   Specimen: Urine, Random  Result Value Ref Range Status   Specimen Description   Final    URINE, RANDOM Performed at [facility], [address], [place], [place] [address]    Special Requests   Final    Normal Performed at [facility], [address] [place], [place] [address]    Culture MULTIPLE SPECIES PRESENT, SUGGEST RECOLLECTION (A)  Final   Report Status 02/01/2020 FINAL  Final  Body fluid culture     Status: None (Preliminary result)   Collection Time: 02/01/20  2:00 PM   Specimen: PATH Cytology Pleural fluid  Result Value Ref Range Status   Specimen Description   Final    PLEURAL Performed at [facility], [address] [place], [place] [address]    Special Requests   Final    NONE Performed at [facility], [place], [place] [address]    Gram Stain NO WBC SEEN NO ORGANISMS SEEN   Final   Culture   Final    NO GROWTH < 24 HOURS Performed at [facility], [place] [address] [place], [place] [address]    Report Status PENDING  Incomplete    Coagulation Studies: No results for input(s): LABPROT, INR in the last 72 hours.  Urinalysis: Recent Labs    01/31/20 1030  COLORURINE RED*  LABSPEC 1.030  PHURINE TEST NOT REPORTED DUE TO COLOR INTERFERENCE OF URINE PIGMENT  GLUCOSEU TEST NOT REPORTED DUE TO COLOR INTERFERENCE OF URINE PIGMENT*  HGBUR TEST NOT REPORTED DUE TO COLOR INTERFERENCE OF URINE PIGMENT*  BILIRUBINUR TEST NOT REPORTED DUE TO  COLOR INTERFERENCE OF URINE PIGMENT*  KETONESUR TEST NOT REPORTED DUE TO COLOR INTERFERENCE OF URINE PIGMENT*  PROTEINUR TEST NOT REPORTED DUE TO COLOR INTERFERENCE OF URINE PIGMENT*  NITRITE TEST NOT REPORTED DUE TO COLOR INTERFERENCE OF URINE PIGMENT*  LEUKOCYTESUR TEST NOT REPORTED DUE TO COLOR INTERFERENCE OF URINE PIGMENT*      Imaging: DG Chest  Port 1 View  Result Date: 02/01/2020 CLINICAL DATA:  Post left thoracentesis EXAM: PORTABLE CHEST 1 VIEW COMPARISON:  Radiograph 01/30/2020 FINDINGS: Interval decrease in size of the left-sided pleural effusions seen on comparison image. No pneumothorax. Persistent heterogeneous airspace opacities in both lungs, improved on the left likely reflecting lung re-expansion and diminished superimposed opacity from the pleural fluid. Persistent right pleural effusion. Central vascular congestion is similar to comparison. Stable cardiomediastinal contours with a calcified, tortuous aorta. Pacer pack overlies the left chest wall with leads at the right atrium and apex. No acute osseous or soft tissue abnormality. Degenerative changes are present in the imaged spine and shoulders. IMPRESSION: 1. Interval decrease in size of left pleural effusion seen on comparison image. No pneumothorax. 2. Small to moderate right pleural effusion, appears slightly increased from prior. 3. Persistent bilateral heterogeneous airspace opacities, improved on the left likely related to the re-expansion and diminished superimposed opacity from the pleural fluid. Electronically Signed   By: Lovena Le M.D.   On: 02/01/2020 16:35   IR NEPHROSTOMY EXCHANGE RIGHT  Result Date: 01/31/2020 INDICATION: Decreased and bloody output from right nephrostomy tube. EXAM: RIGHT NEPHROSTOMY TUBE CHECK. COMPARISON:  ULTRASOUND 01/23/2020. Nephrostomy tube exchange 12/28/2019. MEDICATIONS: None. ANESTHESIA/SEDATION: None. CONTRAST:  Ten cc of nonionic-administered into the collecting system(s) FLUOROSCOPY TIME:  Fluoroscopy Time: 0 minutes 20 seconds (5.7 mGy). COMPLICATIONS: None immediate. PROCEDURE: Informed written consent was obtained from the patient after a thorough discussion of the procedural risks, benefits and alternatives. All questions were addressed. Maximal Sterile Barrier Technique was utilized including caps, mask, sterile gowns, sterile  gloves, sterile drape, hand hygiene and skin antiseptic. A timeout was performed prior to the initiation of the procedure. Right nephrostomy tube in stable position. Nephrostomy tube was suction and Nonionic contrast was injected into the nephrostomy tube. Nephrostomy tube is widely patent. Nephrostomy tube in stable position within the renal collecting system. The tube was placed to gravity drainage. IMPRESSION: Right nephrostomy tube aspirated. Contrast administered demonstrating widely patent right nephrostomy tube in good and stable position. No tube exchange was needed. Electronically Signed   By: Marcello Moores  Register   On: 01/31/2020 15:23   US THORACENTESIS ASP PLEURAL SPACE W/IMG GUIDE  Result Date: 02/01/2020 INDICATION: Pleural effusions, shortness of breath EXAM: ULTRASOUND GUIDED LEFT THORACENTESIS MEDICATIONS: 1% lidocaine local COMPLICATIONS: None immediate. PROCEDURE: An ultrasound guided thoracentesis was thoroughly discussed with the patient and questions answered. The benefits, risks, alternatives and complications were also discussed. The patient understands and wishes to proceed with the procedure. Written consent was obtained. Ultrasound was performed to localize and mark an adequate pocket of fluid in the left chest. The area was then prepped and draped in the normal sterile fashion. 1% Lidocaine was used for local anesthesia. Under ultrasound guidance a 6 Fr Safe-T-Centesis catheter was introduced. Thoracentesis was performed. The catheter was removed and a dressing applied. FINDINGS: A total of approximately 750 cc of serosanguineous pleural fluid was removed. Samples were sent to the laboratory as requested by the clinical team. IMPRESSION: Successful ultrasound guided left thoracentesis yielding 750 cc of pleural fluid. Electronically Signed   By: Jerilynn Mages.  Shick M.D.   On: 02/01/2020 14:52     Medications:   . sodium chloride 10 mL/hr at 02/01/20 1800  . piperacillin-tazobactam (ZOSYN)   IV 2.25 g (02/02/20 1008)  . vancomycin     . ferrous sulfate  325 mg Oral Q breakfast   And  . vitamin C  250 mg Oral Q breakfast  . azelastine  1 spray Each Nare BID  . Chlorhexidine Gluconate Cloth  6 each Topical Q0600  . epoetin (EPOGEN/PROCRIT) injection  10,000 Units Intravenous Q T,Th,Sa-HD  . feeding supplement (ENSURE ENLIVE)  237 mL Oral BID BM  . finasteride  5 mg Oral Daily  . insulin aspart  0-5 Units Subcutaneous QHS  . insulin aspart  0-9 Units Subcutaneous TID WC  . mouth rinse  15 mL Mouth Rinse BID  . metoprolol tartrate  50 mg Oral BID  . multivitamin  1 tablet Oral QHS  . pantoprazole  40 mg Oral Daily  . rosuvastatin  5 mg Oral QHS  . sodium bicarbonate  650 mg Oral BID  . tamsulosin  0.4 mg Oral Daily   sodium chloride, acetaminophen, albuterol, ammonium lactate, metoprolol tartrate, ondansetron (ZOFRAN) IV, vancomycin  Assessment/ Plan:  [name] is a 86 y.o. black male with acute renal failure on hemodialysis, hypertension, hyperlipidemia, diabetes mellitus type 2, GERD, obstructive sleep apnea, urothelial carcinoma of the bladder status post bilateral nephrostomy tube placement, permanent pacemaker placement secondary to sick sinus syndrome, SVT, who is admitted to [facility] on 01/23/2020 for Weakness [R53.1] SOB (shortness of breath) [R06.02] Acute renal failure superimposed on stage 4 chronic kidney disease ([place]) [N17.9, N18.4] Acute renal failure superimposed on chronic kidney disease, unspecified CKD stage, unspecified acute renal failure type ([place]) [N17.9, N18.9] Patient initiated on hemodialysis on 7/17 for uremic symptoms.   1. End Stage Renal Disease: Patient tolerating dialysis well so far.  We will plan for PermCath early next week.  2. Anemia of chronic kidney disease.   Lab Results  Component Value Date   HGB 9.3 (L) 02/01/2020  Continue Epogen with dialysis treatments.  3. Hypertension -Patient having some bouts of V. tach.   Patient maintained on metoprolol.  4. Bilateral pleural effusions: status post Right thoracentesis on 7/21 with 2.4 liters removed.  Left thoracentesis performed 02/01/2020..    LOS: 10 Maaliyah Adolph 7/24/20212:39 PM

## 2020-02-02 NOTE — Progress Notes (Signed)
PT Cancellation Note  Patient Details Name: Blake [name] MRN: [id number] DOB: 02-05-1923   Cancelled Treatment:    Reason Eval/Treat Not Completed: Fatigue/lethargy limiting ability to participate (Attempted to see patient for second time. Patient unable to be roused verbally or physically. Will attempt again at later time/date as available.)  Janna Arch, PT, DPT   02/02/2020, 3:00 PM

## 2020-02-02 NOTE — Progress Notes (Signed)
Patient had a 32 beat run of V-tach this morning. Patient has no signs of distress will continue monitoring.

## 2020-02-02 NOTE — Consult Note (Signed)
Cardiology Consultation Note    Patient ID: Blake [name], MRN: [id number], DOB/AGE: [birth year] 86 y.o. Admit date: 01/23/2020   Date of Consult: 02/02/2020 Primary Physician: Adin Hector, MD Primary Cardiologist: Dr. Clayborn Bigness  Chief Complaint: vt Reason for Consultation: vt Requesting MD: Dr. Leslye Peer  HPI: Blake [name] is a 86 y.o. male with history of sss s/p ppm, afib, dm, sleep apnea without use of cpap, reduced lv funciton with ef of 48% by echo in 2014, aaa 5/4.6 cm who was admitted January 23, 2020 with reduced urine output.  Had a history of bladder carcinoma status post bilateral nephrostomy.  BNP on presentation was 2109.  Creatinine was 7.8 up from 3.69.  Has undergone hemodialysis via a groin temporary catheter.  Patient is a very difficult historian and does not answer questions appropriately.  Had SVT with tachycardia.  Metoprolol was increased to 50 mg twice daily.  Had a run of nonsustained wide-complex tachycardia.  He is status post bilateral thoracentesis.  Potassium was 3.3 yesterday.  With a creatinine improved to 3.65.  Hemoglobin 9.3.  BNP is increased to 4000.  Echo done on a year ago showed preserved LV function.  Past Medical History:  Diagnosis Date  . AAA (abdominal aortic aneurysm) ([place])   . Arrhythmia   . CHF (congestive heart failure) ([place])   . Diabetes mellitus without complication ([place])   . GERD (gastroesophageal reflux disease)   . GI bleed   . Heart murmur   . Hyperlipidemia   . Hypertension   . Pacemaker   . Pneumonia   . Prostate cancer ([place])   . Sleep apnea       Surgical History:  Past Surgical History:  Procedure Laterality Date  . FLEXIBLE SIGMOIDOSCOPY N/A 10/21/2016   Procedure: FLEXIBLE SIGMOIDOSCOPY;  Surgeon: [name], MD;  Location: [facility] ENDOSCOPY;  Service: Endoscopy;  Laterality: N/A;  . INSERT / REPLACE / REMOVE PACEMAKER    . IR NEPHROSTOGRAM LEFT THRU EXISTING ACCESS  01/16/2019  . IR NEPHROSTOMY EXCHANGE LEFT   02/06/2019  . IR NEPHROSTOMY EXCHANGE LEFT  04/12/2019  . IR NEPHROSTOMY EXCHANGE LEFT  06/01/2019  . IR NEPHROSTOMY EXCHANGE LEFT  07/20/2019  . IR NEPHROSTOMY EXCHANGE LEFT  09/19/2019  . IR NEPHROSTOMY EXCHANGE LEFT  11/09/2019  . IR NEPHROSTOMY EXCHANGE LEFT  12/28/2019  . IR NEPHROSTOMY EXCHANGE RIGHT  02/06/2019  . IR NEPHROSTOMY EXCHANGE RIGHT  04/12/2019  . IR NEPHROSTOMY EXCHANGE RIGHT  06/01/2019  . IR NEPHROSTOMY EXCHANGE RIGHT  07/20/2019  . IR NEPHROSTOMY EXCHANGE RIGHT  09/19/2019  . IR NEPHROSTOMY EXCHANGE RIGHT  11/09/2019  . IR NEPHROSTOMY EXCHANGE RIGHT  12/28/2019  . IR NEPHROSTOMY EXCHANGE RIGHT  01/31/2020  . IR NEPHROSTOMY PLACEMENT RIGHT  01/01/2019  . JOINT REPLACEMENT    . NEPHROSTOMY Bilateral   . PACEMAKER INSERTION Left 04/14/2015   Procedure: INSERTION PACEMAKER;  Surgeon: Isaias Cowman, MD;  Location: [facility] ORS;  Service: Cardiovascular;  Laterality: Left;  . PROSTATE SURGERY    . TEMPORARY DIALYSIS CATHETER N/A 01/25/2020   Procedure: TEMPORARY DIALYSIS CATHETER;  Surgeon: Algernon Huxley, MD;  Location: [place] [facility];  Service: Cardiovascular;  Laterality: N/A;  . VISCERAL ARTERY INTERVENTION N/A 10/22/2016   Procedure: Visceral Artery Intervention;  Surgeon: Algernon Huxley, MD;  Location: [place] [facility];  Service: Cardiovascular;  Laterality: N/A;     Home Meds: Prior to Admission medications   Medication Sig Start Date End Date Taking? Authorizing  Provider  azelastine (ASTELIN) 0.1 % nasal spray SMARTSIG:1 Spray(s) Both Nares Twice Daily PRN 11/12/19  Yes [provider]  ciprofloxacin (CIPRO) 250 MG tablet Take 250 mg by mouth 2 (two) times daily. 01/10/20  Yes [provider]  feeding supplement, GLUCERNA SHAKE, (GLUCERNA SHAKE) LIQD Take 237 mLs by mouth 2 (two) times daily between meals. 01/01/19  Yes Fritzi Mandes, MD  finasteride (PROSCAR) 5 MG tablet Take 5 mg by mouth daily.   Yes [provider]  Iron-Vitamin C (VITRON-C PO)  Take 1 tablet by mouth daily.   Yes [provider]  metoprolol tartrate 37.5 MG TABS Take 37.5 mg by mouth 2 (two) times daily. 09/13/17  Yes Gladstone Lighter, MD  Multiple Vitamins-Minerals (PRESERVISION AREDS) CAPS Take 1 capsule by mouth 2 (two) times daily.   Yes [provider]  multivitamin-iron-minerals-folic acid (CENTRUM) chewable tablet Chew 2 tablets by mouth daily.   Yes [provider]  pantoprazole (PROTONIX) 40 MG tablet Take 40 mg by mouth daily.   Yes [provider]  rosuvastatin (CRESTOR) 5 MG tablet Take 5 mg by mouth at bedtime.   Yes [provider]  sitaGLIPtin (JANUVIA) 50 MG tablet Take 0.5 tablets (25 mg total) by mouth daily. 01/01/19  Yes Fritzi Mandes, MD  sodium bicarbonate 650 MG tablet Take 1 tablet (650 mg total) by mouth 2 (two) times daily. 09/13/17  Yes Gladstone Lighter, MD  tamsulosin (FLOMAX) 0.4 MG CAPS capsule Take 0.4 mg by mouth daily.    Yes [provider]  torsemide (DEMADEX) 20 MG tablet Take 40 mg by mouth 2 (two) times daily.  03/29/19 03/28/20 Yes [provider]    Inpatient Medications:  . ferrous sulfate  325 mg Oral Q breakfast   And  . vitamin C  250 mg Oral Q breakfast  . azelastine  1 spray Each Nare BID  . Chlorhexidine Gluconate Cloth  6 each Topical Q0600  . epoetin (EPOGEN/PROCRIT) injection  10,000 Units Intravenous Q T,Th,Sa-HD  . feeding supplement (ENSURE ENLIVE)  237 mL Oral BID BM  . finasteride  5 mg Oral Daily  . insulin aspart  0-5 Units Subcutaneous QHS  . insulin aspart  0-9 Units Subcutaneous TID WC  . mouth rinse  15 mL Mouth Rinse BID  . metoprolol tartrate  50 mg Oral BID  . multivitamin  1 tablet Oral QHS  . pantoprazole  40 mg Oral Daily  . rosuvastatin  5 mg Oral QHS  . sodium bicarbonate  650 mg Oral BID  . tamsulosin  0.4 mg Oral Daily   . sodium chloride 10 mL/hr at 02/01/20 1800  . piperacillin-tazobactam (ZOSYN)  IV 2.25 g (02/02/20 1008)  .  vancomycin      Allergies:  Allergies  Allergen Reactions  . Cefepime     seizure  . Diovan [Valsartan] Cough  . Gabapentin Other (See Comments)    Sedation at all doses  . Hydralazine Itching  . Lisinopril Cough  . Pregabalin Other (See Comments)    Sedation at all doses  . Levofloxacin Other (See Comments)    Too many side effects. Nausea, vomiting, upset stomach, increased confusion, etc Other reaction(s): Confusion Nausea, chills Nausea, chills   . Sulfamethoxazole-Trimethoprim Other (See Comments)    Too many side effects. Nausea, vomiting, upset stomach, increased confusion, etc    Social History   Socioeconomic History  . Marital status: Married    Spouse name: Not on file  . Number of  children: Not on file  . Years of education: Not on file  . Highest education level: Not on file  Occupational History  . Not on file  Tobacco Use  . Smoking status: Former Research scientist (life sciences)  . Smokeless tobacco: Never Used  Vaping Use  . Vaping Use: Never used  Substance and Sexual Activity  . Alcohol use: No  . Drug use: No  . Sexual activity: Not Currently  Other Topics Concern  . Not on file  Social History Narrative  . Not on file   Social Determinants of Health   Financial Resource Strain:   . Difficulty of Paying Living Expenses:   Food Insecurity:   . Worried About Charity fundraiser in the Last Year:   . Arboriculturist in the Last Year:   Transportation Needs:   . Film/video editor (Medical):   Marland Kitchen Lack of Transportation (Non-Medical):   Physical Activity:   . Days of Exercise per Week:   . Minutes of Exercise per Session:   Stress:   . Feeling of Stress :   Social Connections:   . Frequency of Communication with Friends and Family:   . Frequency of Social Gatherings with Friends and Family:   . Attends Religious Services:   . Active Member of Clubs or Organizations:   . Attends Archivist Meetings:   Marland Kitchen Marital Status:   Intimate Partner  Violence:   . Fear of Current or Ex-Partner:   . Emotionally Abused:   Marland Kitchen Physically Abused:   . Sexually Abused:      Family History  Problem Relation Age of Onset  . Hypertension Other   . Stroke Other   . Heart attack Other   . Stroke Sister   . Heart attack Brother      Review of Systems: A 12-system review of systems was performed and is negative except as noted in the HPI.  Labs: No results for input(s): CKTOTAL, CKMB, TROPONINI in the last 72 hours. Lab Results  Component Value Date   WBC 7.9 02/01/2020   HGB 9.3 (L) 02/01/2020   HCT 29.4 (L) 02/01/2020   MCV 90.2 02/01/2020   PLT 53 (L) 02/01/2020    Recent Labs  Lab 02/01/20 1048  NA 137  K 3.3*  CL 97*  CO2 33*  BUN 34*  CREATININE 3.65*  CALCIUM 7.8*  GLUCOSE 146*   No results found for: CHOL, HDL, LDLCALC, TRIG No results found for: DDIMER  Radiology/Studies:  DG Chest 1 View  Result Date: 01/30/2020 CLINICAL DATA:  Follow-up right thoracentesis EXAM: CHEST  1 VIEW COMPARISON:  01/29/2020 FINDINGS: Dual lead pacemaker unchanged. Cardiomegaly and aortic atherosclerosis. Marked improvement on the right with only a small amount of residual pleural density and mild volume loss in the right lower lobe. Persistent left effusion and left lower lung atelectasis. IMPRESSION: Marked radiographic improvement following right thoracentesis. Small amount of remaining pleural fluid on the right with mild volume loss in the right lower lobe. No postprocedural pneumothorax. No change in left effusion with areas of atelectasis in the left lung. Electronically Signed   By: Nelson Chimes M.D.   On: 01/30/2020 17:03   DG Chest 1 View  Result Date: 01/29/2020 CLINICAL DATA:  86 year old male with tachycardia. EXAM: CHEST  1 VIEW COMPARISON:  Chest radiograph dated 01/23/2020. FINDINGS: There is near complete opacification the right hemithorax consistent with a large pleural effusion with associated near complete compressive  atelectasis of the right  lung. Pneumonia or underlying mass is not excluded. Clinical correlation and follow-up to resolution recommended. Overall significant interval progression of the right lung opacity compared to the radiograph of 01/23/2020. There is only a small aerated portion of the right lung. Interval increase in the size of the left pleural effusion with increase in the left lung base atelectasis or infiltrate. No pneumothorax. Stable cardiomegaly. Atherosclerotic calcification of the aorta. Left pectoral pacemaker device. Degenerative changes of the spine. No acute osseous pathology. IMPRESSION: 1. Large right pleural effusion with near complete compressive atelectasis of the right lung. Overall significant progression of right lung opacity since the prior radiograph. Pneumonia or underlying mass is not excluded. Clinical correlation and follow-up to resolution recommended. 2. Interval increase in the size of the left pleural effusion and left lung base atelectasis or infiltrate. Electronically Signed   By: Anner Crete M.D.   On: 01/29/2020 21:05   US Renal  Result Date: 01/23/2020 CLINICAL DATA:  Renal failure. History of bladder cancer. History of prostate cancer. EXAM: RENAL / URINARY TRACT ULTRASOUND COMPLETE COMPARISON:  CT scan 11/03/2019 FINDINGS: Right Kidney: Renal measurements: 8.3 x 4.0 x 4.0 cm = volume: 69.53 mL. Increased echogenicity and renal cortical scarring type changes. A right nephrostomy tube is noted. No hydronephrosis. No worrisome renal lesions are identified. There is a small cyst noted in the midpole region. Left Kidney: Renal measurements: 11.0 x 5.8 x 5.6 cm = volume: 185.88 mL. Slight increased echogenicity but normal renal cortical thickness. A nephrostomy tube is noted. No hydronephrosis. No worrisome renal lesions. Bladder: Slightly thick-walled bladder with echogenic material which could represent hematoma or mass. Other: Small bilateral pleural effusions are  noted along with trace ascites. IMPRESSION: 1. Small right kidney and normal size left kidney. 2. Bilateral nephrostomy tubes.  No hydronephrosis. 3. Indeterminate echogenic material in the bladder could be hematoma or mass. 4. Small bilateral pleural effusions. Electronically Signed   By: Marijo Sanes M.D.   On: 01/23/2020 12:49   PERIPHERAL VASCULAR CATHETERIZATION  Result Date: 01/28/2020 See op note  DG Chest Port 1 View  Result Date: 02/01/2020 CLINICAL DATA:  Post left thoracentesis EXAM: PORTABLE CHEST 1 VIEW COMPARISON:  Radiograph 01/30/2020 FINDINGS: Interval decrease in size of the left-sided pleural effusions seen on comparison image. No pneumothorax. Persistent heterogeneous airspace opacities in both lungs, improved on the left likely reflecting lung re-expansion and diminished superimposed opacity from the pleural fluid. Persistent right pleural effusion. Central vascular congestion is similar to comparison. Stable cardiomediastinal contours with a calcified, tortuous aorta. Pacer pack overlies the left chest wall with leads at the right atrium and apex. No acute osseous or soft tissue abnormality. Degenerative changes are present in the imaged spine and shoulders. IMPRESSION: 1. Interval decrease in size of left pleural effusion seen on comparison image. No pneumothorax. 2. Small to moderate right pleural effusion, appears slightly increased from prior. 3. Persistent bilateral heterogeneous airspace opacities, improved on the left likely related to the re-expansion and diminished superimposed opacity from the pleural fluid. Electronically Signed   By: Lovena Le M.D.   On: 02/01/2020 16:35   DG Chest Port 1 View  Result Date: 01/23/2020 CLINICAL DATA:  Shortness of breath EXAM: PORTABLE CHEST 1 VIEW COMPARISON:  September 24, 2019 FINDINGS: There is stable cardiomegaly. Aortic knob calcifications. Hazy interstitial opacities are seen throughout both lungs right greater than left. There is  a moderate right effusion present. Hazy airspace opacity at the right lung base, likely layering effusions/atelectasis. A  left-sided pacemaker again noted. No acute osseous abnormality. IMPRESSION: Findings suggestive of interstitial edema. Moderate right pleural effusion with adjacent hazy airspace opacity, likely layering effusions/atelectasis. Electronically Signed   By: Prudencio Pair M.D.   On: 01/23/2020 15:45   IR NEPHROSTOMY EXCHANGE RIGHT  Result Date: 01/31/2020 INDICATION: Decreased and bloody output from right nephrostomy tube. EXAM: RIGHT NEPHROSTOMY TUBE CHECK. COMPARISON:  ULTRASOUND 01/23/2020. Nephrostomy tube exchange 12/28/2019. MEDICATIONS: None. ANESTHESIA/SEDATION: None. CONTRAST:  Ten cc of nonionic-administered into the collecting system(s) FLUOROSCOPY TIME:  Fluoroscopy Time: 0 minutes 20 seconds (5.7 mGy). COMPLICATIONS: None immediate. PROCEDURE: Informed written consent was obtained from the patient after a thorough discussion of the procedural risks, benefits and alternatives. All questions were addressed. Maximal Sterile Barrier Technique was utilized including caps, mask, sterile gowns, sterile gloves, sterile drape, hand hygiene and skin antiseptic. A timeout was performed prior to the initiation of the procedure. Right nephrostomy tube in stable position. Nephrostomy tube was suction and Nonionic contrast was injected into the nephrostomy tube. Nephrostomy tube is widely patent. Nephrostomy tube in stable position within the renal collecting system. The tube was placed to gravity drainage. IMPRESSION: Right nephrostomy tube aspirated. Contrast administered demonstrating widely patent right nephrostomy tube in good and stable position. No tube exchange was needed. Electronically Signed   By: Marcello Moores  Register   On: 01/31/2020 15:23   US THORACENTESIS ASP PLEURAL SPACE W/IMG GUIDE  Result Date: 02/01/2020 INDICATION: Pleural effusions, shortness of breath EXAM: ULTRASOUND GUIDED  LEFT THORACENTESIS MEDICATIONS: 1% lidocaine local COMPLICATIONS: None immediate. PROCEDURE: An ultrasound guided thoracentesis was thoroughly discussed with the patient and questions answered. The benefits, risks, alternatives and complications were also discussed. The patient understands and wishes to proceed with the procedure. Written consent was obtained. Ultrasound was performed to localize and mark an adequate pocket of fluid in the left chest. The area was then prepped and draped in the normal sterile fashion. 1% Lidocaine was used for local anesthesia. Under ultrasound guidance a 6 Fr Safe-T-Centesis catheter was introduced. Thoracentesis was performed. The catheter was removed and a dressing applied. FINDINGS: A total of approximately 750 cc of serosanguineous pleural fluid was removed. Samples were sent to the laboratory as requested by the clinical team. IMPRESSION: Successful ultrasound guided left thoracentesis yielding 750 cc of pleural fluid. Electronically Signed   By: Jerilynn Mages.  Shick M.D.   On: 02/01/2020 14:52   US THORACENTESIS ASP PLEURAL SPACE W/IMG GUIDE  Result Date: 01/30/2020 INDICATION: 86 year old male with a history of pleural effusion, referred for thoracentesis EXAM: ULTRASOUND GUIDED RIGHT THORACENTESIS MEDICATIONS: None. COMPLICATIONS: None PROCEDURE: An ultrasound guided thoracentesis was thoroughly discussed with the patient and questions answered. The benefits, risks, alternatives and complications were also discussed. The patient understands and wishes to proceed with the procedure. Written consent was obtained. Ultrasound was performed to localize and mark an adequate pocket of fluid in the right chest. The area was then prepped and draped in the normal sterile fashion. 1% Lidocaine was used for local anesthesia. Under ultrasound guidance a 8 Fr Safe-T-Centesis catheter was introduced. Thoracentesis was performed. The catheter was removed and a dressing applied. FINDINGS: A total  of approximately 2400 cc of thin yellow fluid was removed. Samples were sent to the laboratory as requested by the clinical team. IMPRESSION: Status post ultrasound-guided right-sided thoracentesis. Electronically Signed   By: Corrie Mckusick D.O.   On: 01/30/2020 15:35    Wt Readings from Last 3 Encounters:  02/02/20 (!) 92.5 kg  11/04/19 83.7 kg  09/24/19 97.5 kg    EKG: EKG on admission atrial fibrillation  Physical Exam: Chronically ill-appearing African-American male in no acute distress Blood pressure 100/76, pulse 105, temperature (!) 97.5 F (36.4 C), temperature source Oral, resp. rate 20, height 6' (1.829 m), weight (!) 92.5 kg, SpO2 99 %. Body mass index is 27.66 kg/m. General: Well developed, well nourished, in no acute distress. Head: Normocephalic, atraumatic, sclera non-icteric, no xanthomas, nares are without discharge.  Neck: Negative for carotid bruits. JVD not elevated. Lungs: Clear bilaterally to auscultation without wheezes, rales, or rhonchi. Breathing is unlabored. Heart: Irregular irregular no murmurs, rubs, or gallops appreciated. Abdomen: Soft, non-tender, non-distended with normoactive bowel sounds. No hepatomegaly. No rebound/guarding. No obvious abdominal masses. Msk:  Strength and tone appear normal for age. Extremities: No clubbing or cyanosis. No edema.  Distal pedal pulses are 2+ and equal bilaterally. Neuro: Arousable     Assessment and Plan  86 year old male with history of multiple medical problems including sick sinus syndrome status post permanent pacemaker, history of acute on chronic renal insufficiency requiring hemodialysis at least temporarily, who was noted to have an episode of nonsustained wide-complex tachycardia.  Has atrial fibrillation.  Rhythm strip reviewed.  Ventricular arrhythmia versus aberrant atrial fibrillation.  Patient has a backup pacemaker therefore would recommend attempting beta-blocker therapy to rate control and treat this  rhythm.  His potassium was 3.3.  We will attempt to keep this greater than 4.  Signed, Teodoro Spray MD 02/02/2020, 10:35 AM Pager: [phone]

## 2020-02-02 NOTE — Progress Notes (Signed)
PT Cancellation Note  Patient Details Name: NAMAN SPYCHALSKI MRN: [id number] DOB: 08/25/1922   Cancelled Treatment:    Reason Eval/Treat Not Completed: Fatigue/lethargy limiting ability to participate (Patient consult received and reviewed. Patient sleeping upon PT arrival, briefly woke to tactile cue and promptly fell back asleep. Unable to stay awake for evaluation. Will attempt again at later time/date as available/appropriate.)  Janna Arch, PT, DPT   02/02/2020, 2:06 PM

## 2020-02-02 NOTE — Progress Notes (Signed)
Pt had a 16 beat run of Vtach. Pt was sleeping. VSS, no chest pain noted. Paged on call provider, Stark Klein, NP. No new orders obtained. Will continue to monitor.

## 2020-02-02 NOTE — Progress Notes (Signed)
Patient ID: [name], male   DOB: 10-26-1922, 86 y.o.   MRN: [id number] [facility] PROGRESS NOTE  KARRINGTON STUDNICKA KNL:[id number] DOB: May 13, 1923 DOA: 01/23/2020 PCP: Adin Hector, MD  HPI/Subjective: Patient answered a few yes or no questions.  Initially admitted with decreased urine output and found to have acute on chronic kidney disease stage IV.  Right nephrostomy tube still with bloody output.  Patient thinks he is breathing better.  Objective: Vitals:   02/02/20 1034 02/02/20 1202  BP:  108/80  Pulse: 105 (!) 117  Resp:  18  Temp:  97.8 F (36.6 C)  SpO2:  99%    Intake/Output Summary (Last 24 hours) at 02/02/2020 1449 Last data filed at 02/02/2020 0602 Gross per 24 hour  Intake 457.27 ml  Output 120 ml  Net 337.27 ml   Filed Weights   01/31/20 0500 02/01/20 0439 02/02/20 0544  Weight: 92.8 kg (!) 93.1 kg (!) 92.5 kg    ROS: Review of Systems  Respiratory: Positive for shortness of breath. Negative for cough.   Cardiovascular: Negative for chest pain.  Gastrointestinal: Negative for abdominal pain.   Exam: Physical Exam HENT:     Head: Normocephalic.     Nose: No mucosal edema.  Eyes:     General: Lids are normal.     Conjunctiva/sclera: Conjunctivae normal.  Cardiovascular:     Rate and Rhythm: Tachycardia present. Rhythm irregularly irregular.     Heart sounds: Normal heart sounds, S1 normal and S2 normal.  Pulmonary:     Breath sounds: Examination of the right-lower field reveals decreased breath sounds and rhonchi. Examination of the left-lower field reveals decreased breath sounds and rhonchi. Decreased breath sounds and rhonchi present. No wheezing or rales.  Abdominal:     Palpations: Abdomen is soft.     Tenderness: There is no abdominal tenderness.  Musculoskeletal:     Right ankle: Swelling present.     Left ankle: Swelling present.  Skin:    General: Skin is warm.     Findings: No rash.  Neurological:     Mental Status: He is  alert.     Comments: Able to straight leg raise.       Data Reviewed: Basic Metabolic Panel: Recent Labs  Lab 01/27/20 0451 01/27/20 0451 01/28/20 1017 01/28/20 1520 01/29/20 1030 01/31/20 0541 02/01/20 1048  NA 136   < > 135 135 138 139 137  K 4.0   < > 3.8 3.5 3.7 3.9 3.3*  CL 95*   < > 93* 96* 97* 99 97*  CO2 29   < > 26 25 28 28  33*  GLUCOSE 93   < > 97 132* 170* 130* 146*  BUN 95*   < > 103* 66* 67* 59* 34*  CREATININE 7.29*   < > 7.92* 5.88* 6.55* 6.46* 3.65*  CALCIUM 7.6*   < > 7.8* 7.7* 7.8* 7.8* 7.8*  MG  --   --   --   --  2.4  --   --   PHOS 7.8*  --  8.3*  --  5.9*  --  2.7   < > = values in this interval not displayed.   Liver Function Tests: Recent Labs  Lab 01/27/20 0451 01/28/20 1017 01/29/20 1030 02/01/20 1048  ALBUMIN 2.7* 2.7* 2.7* 2.5*   CBC: Recent Labs  Lab 01/27/20 0451 01/28/20 1017 01/29/20 1030 02/01/20 1048  WBC 4.6 5.1 5.7 7.9  HGB 9.2* 9.7* 9.5* 9.3*  HCT 28.3* 29.7* 29.4* 29.4*  MCV 87.9 87.9 88.6 90.2  PLT 60* 52* 52* 53*   BNP (last 3 results) Recent Labs    09/24/19 1530 01/23/20 0948 01/29/20 1046  BNP 982.0* 2,109.0* 4,446.4*    CBG: Recent Labs  Lab 02/01/20 0740 02/01/20 1533 02/01/20 2122 02/02/20 0750 02/02/20 1204  GLUCAP 109* 124* 163* 100* 170*    Recent Results (from the past 240 hour(s))  SARS Coronavirus 2 by RT PCR (hospital order, performed in [facility] hospital lab) Nasopharyngeal Nasopharyngeal Swab     Status: None   Collection Time: 01/23/20  4:00 PM   Specimen: Nasopharyngeal Swab  Result Value Ref Range Status   SARS Coronavirus 2 NEGATIVE NEGATIVE Final    Comment: (NOTE) SARS-CoV-2 target nucleic acids are NOT DETECTED.  The SARS-CoV-2 RNA is generally detectable in upper and lower respiratory specimens during the acute phase of infection. The lowest concentration of SARS-CoV-2 viral copies this assay can detect is 250 copies / mL. A negative result does not preclude SARS-CoV-2  infection and should not be used as the sole basis for treatment or other patient management decisions.  A negative result may occur with improper specimen collection / handling, submission of specimen other than nasopharyngeal swab, presence of viral mutation(s) within the areas targeted by this assay, and inadequate number of viral copies (<250 copies / mL). A negative result must be combined with clinical observations, patient history, and epidemiological information.  Fact Sheet for Patients:   StrictlyIdeas.no  Fact Sheet for Healthcare Providers: BankingDealers.co.za  This test is not yet approved or  cleared by the [place] FDA and has been authorized for detection and/or diagnosis of SARS-CoV-2 by FDA under an Emergency Use Authorization (EUA).  This EUA will remain in effect (meaning this test can be used) for the duration of the COVID-19 declaration under Section 564(b)(1) of the Act, 21 U.S.C. section 360bbb-3(b)(1), unless the authorization is terminated or revoked sooner.  Performed at [facility], [address] [place], [place] [address]   CULTURE, BLOOD (ROUTINE X 2) w Reflex to ID Panel     Status: None   Collection Time: 01/23/20  5:13 PM   Specimen: BLOOD  Result Value Ref Range Status   Specimen Description BLOOD BLOOD LEFT HAND  Final   Special Requests   Final    BOTTLES DRAWN AEROBIC AND ANAEROBIC Blood Culture adequate volume   Culture   Final    NO GROWTH 5 DAYS Performed at [facility], [address] [place], [place] [address]    Report Status 01/28/2020 FINAL  Final  CULTURE, BLOOD (ROUTINE X 2) w Reflex to ID Panel     Status: None   Collection Time: 01/23/20  6:12 PM   Specimen: BLOOD  Result Value Ref Range Status   Specimen Description BLOOD BLOOD LEFT WRIST  Final   Special Requests Blood Culture adequate volume  Final   Culture   Final    NO GROWTH 5  DAYS Performed at [facility], [address], [place], [place] [address]    Report Status 01/28/2020 FINAL  Final  MRSA PCR Screening     Status: Abnormal   Collection Time: 01/25/20  8:17 AM   Specimen: Nasopharyngeal  Result Value Ref Range Status   MRSA by PCR POSITIVE (A) NEGATIVE Final    Comment:        The GeneXpert MRSA Assay (FDA approved for NASAL specimens only), is one component  of a comprehensive MRSA colonization surveillance program. It is not intended to diagnose MRSA infection nor to guide or monitor treatment for MRSA infections. RESULT CALLED TO, READ BACK BY AND VERIFIED WITH: MARIA [facility] 01/25/20 1945 SJL Performed at [facility], [address] [place], [place] [address]   Body fluid culture     Status: None (Preliminary result)   Collection Time: 01/30/20  2:40 PM   Specimen: PATH Cytology Pleural fluid  Result Value Ref Range Status   Specimen Description   Final    PLEURAL Performed at [facility], [address] [place], [place] [address]    Special Requests   Final    NONE Performed at [facility], [address] [place], [place] [address]    Gram Stain   Final    RARE WBC PRESENT, PREDOMINANTLY MONONUCLEAR NO ORGANISMS SEEN    Culture   Final    NO GROWTH 3 DAYS Performed at [facility], [place] [address], [place], [place] [address]    Report Status PENDING  Incomplete  Urine Culture     Status: Abnormal   Collection Time: 01/31/20 10:30 AM   Specimen: Urine, Random  Result Value Ref Range Status   Specimen Description   Final    URINE, RANDOM Performed at [facility], [address], [place], [place] [address]    Special Requests   Final    Normal Performed at [facility], [address] [place], [place] [address]    Culture MULTIPLE SPECIES PRESENT, SUGGEST RECOLLECTION (A)  Final   Report Status 02/01/2020 FINAL  Final  Body fluid culture     Status:  None (Preliminary result)   Collection Time: 02/01/20  2:00 PM   Specimen: PATH Cytology Pleural fluid  Result Value Ref Range Status   Specimen Description   Final    PLEURAL Performed at [facility], [address], [place], [place] [address]    Special Requests   Final    NONE Performed at [facility], [place], [place] [address]    Gram Stain NO WBC SEEN NO ORGANISMS SEEN   Final   Culture   Final    NO GROWTH < 24 HOURS Performed at [facility], [place] [address] [place], [place] [address]    Report Status PENDING  Incomplete     Studies: DG Chest Port 1 View  Result Date: 02/01/2020 CLINICAL DATA:  Post left thoracentesis EXAM: PORTABLE CHEST 1 VIEW COMPARISON:  Radiograph 01/30/2020 FINDINGS: Interval decrease in size of the left-sided pleural effusions seen on comparison image. No pneumothorax. Persistent heterogeneous airspace opacities in both lungs, improved on the left likely reflecting lung re-expansion and diminished superimposed opacity from the pleural fluid. Persistent right pleural effusion. Central vascular congestion is similar to comparison. Stable cardiomediastinal contours with a calcified, tortuous aorta. Pacer pack overlies the left chest wall with leads at the right atrium and apex. No acute osseous or soft tissue abnormality. Degenerative changes are present in the imaged spine and shoulders. IMPRESSION: 1. Interval decrease in size of left pleural effusion seen on comparison image. No pneumothorax. 2. Small to moderate right pleural effusion, appears slightly increased from prior. 3. Persistent bilateral heterogeneous airspace opacities, improved on the left likely related to the re-expansion and diminished superimposed opacity from the pleural fluid. Electronically Signed   By: Lovena Le M.D.   On: 02/01/2020 16:35   IR NEPHROSTOMY EXCHANGE RIGHT  Result Date: 01/31/2020 INDICATION: Decreased and bloody output from  right nephrostomy tube. EXAM: RIGHT NEPHROSTOMY TUBE CHECK. COMPARISON:  ULTRASOUND 01/23/2020. Nephrostomy tube exchange 12/28/2019. MEDICATIONS: None. ANESTHESIA/SEDATION: None. CONTRAST:  Ten cc of nonionic-administered into the collecting system(s) FLUOROSCOPY TIME:  Fluoroscopy Time: 0 minutes 20 seconds (5.7 mGy). COMPLICATIONS: None immediate. PROCEDURE: Informed written consent was obtained from the patient after a thorough discussion of the procedural risks, benefits and alternatives. All questions were addressed. Maximal Sterile Barrier Technique was utilized including caps, mask, sterile gowns, sterile gloves, sterile drape, hand hygiene and skin antiseptic. A timeout was performed prior to the initiation of the procedure. Right nephrostomy tube in stable position. Nephrostomy tube was suction and Nonionic contrast was injected into the nephrostomy tube. Nephrostomy tube is widely patent. Nephrostomy tube in stable position within the renal collecting system. The tube was placed to gravity drainage. IMPRESSION: Right nephrostomy tube aspirated. Contrast administered demonstrating widely patent right nephrostomy tube in good and stable position. No tube exchange was needed. Electronically Signed   By: Marcello Moores  Register   On: 01/31/2020 15:23   US THORACENTESIS ASP PLEURAL SPACE W/IMG GUIDE  Result Date: 02/01/2020 INDICATION: Pleural effusions, shortness of breath EXAM: ULTRASOUND GUIDED LEFT THORACENTESIS MEDICATIONS: 1% lidocaine local COMPLICATIONS: None immediate. PROCEDURE: An ultrasound guided thoracentesis was thoroughly discussed with the patient and questions answered. The benefits, risks, alternatives and complications were also discussed. The patient understands and wishes to proceed with the procedure. Written consent was obtained. Ultrasound was performed to localize and mark an adequate pocket of fluid in the left chest. The area was then prepped and draped in the normal sterile fashion.  1% Lidocaine was used for local anesthesia. Under ultrasound guidance a 6 Fr Safe-T-Centesis catheter was introduced. Thoracentesis was performed. The catheter was removed and a dressing applied. FINDINGS: A total of approximately 750 cc of serosanguineous pleural fluid was removed. Samples were sent to the laboratory as requested by the clinical team. IMPRESSION: Successful ultrasound guided left thoracentesis yielding 750 cc of pleural fluid. Electronically Signed   By: Jerilynn Mages.  Shick M.D.   On: 02/01/2020 14:52    Scheduled Meds: . ferrous sulfate  325 mg Oral Q breakfast   And  . vitamin C  250 mg Oral Q breakfast  . azelastine  1 spray Each Nare BID  . Chlorhexidine Gluconate Cloth  6 each Topical Q0600  . epoetin (EPOGEN/PROCRIT) injection  10,000 Units Intravenous Q T,Th,Sa-HD  . feeding supplement (ENSURE ENLIVE)  237 mL Oral BID BM  . finasteride  5 mg Oral Daily  . insulin aspart  0-5 Units Subcutaneous QHS  . insulin aspart  0-9 Units Subcutaneous TID WC  . mouth rinse  15 mL Mouth Rinse BID  . metoprolol tartrate  50 mg Oral BID  . multivitamin  1 tablet Oral QHS  . pantoprazole  40 mg Oral Daily  . rosuvastatin  5 mg Oral QHS  . sodium bicarbonate  650 mg Oral BID  . tamsulosin  0.4 mg Oral Daily   Continuous Infusions: . sodium chloride 10 mL/hr at 02/01/20 1800  . piperacillin-tazobactam (ZOSYN)  IV 2.25 g (02/02/20 1008)  . vancomycin      Assessment/Plan:  1. End-stage renal disease.  Came in with acute kidney injury on chronic kidney disease stage IV.  Nephrology did dialysis yesterday to temporary catheter.  Hopefully vascular surgery to put in PermCath on Monday. 2. Purulent drainage from right nephrostomy tube with blood coming out of the right nephrostomy  tube.  Interventional radiology looked at the tube on 01/31/2020.  Empiric antibiotics. 3. Bilateral pleural effusions.  Right thoracentesis removed 2400 mL on 7/21 and left thoracentesis removed 750 mL on 7/23.   Slight fluid may be reaccumulating in the right thoracic space. 4. Atrial fibrillation and SVT.  Metoprolol 50 mg twice a day. 5. Nonsustained ventricular tachycardia versus aberrant atrial fibrillation.  [place] cardiology consultation.  Last echo with normal EF.  Try to avoid electrolyte replacements with dialysis patients.  Likely secondary from recent thoracentesis. 6. Acute hypoxic respiratory failure on oxygen supplementation.  Currently on 3 L. 7. Acute on chronic diastolic congestive heart failure.  Dialysis to remove fluid 8. Thrombocytopenia.  Check labs tomorrow.  May end up needing platelets prior to PermCath. 9. Anemia of chronic disease 10. Chronic decubitus ulcer present on admission stage II sacrum. 11. Weakness.  Physical therapy evaluation    Code Status:     Code Status Orders  (From admission, onward)         Start     Ordered   01/23/20 1517  Full code  Continuous        01/23/20 1516        Code Status History    Date Active Date Inactive Code Status Order ID Comments User Context   11/04/2019 0118 11/05/2019 2145 Full Code [id number]  Neena Rhymes, MD ED   11/04/2019 0054 11/04/2019 0118 DNR [id number]  Neena Rhymes, MD ED   09/24/2019 2132 09/28/2019 2309 Full Code [id number]  Orene Desanctis, DO ED   01/03/2019 0142 01/09/2019 2238 Full Code [id number]  Mansy, Arvella Merles, MD ED   12/25/2018 1233 01/02/2019 1950 Full Code [id number]  Dustin Flock, MD Inpatient   12/25/2018 0645 12/25/2018 1233 Full Code [id number]  Paulette Blanch, MD ED   11/21/2018 2217 11/29/2018 2052 Full Code [id number]  Mayer Camel, NP ED   11/05/2018 1755 11/09/2018 1728 Full Code [id number]  Mayo, Pete Pelt, MD Inpatient   09/10/2017 1430 09/13/2017 2115 Full Code [id number]  Idelle Crouch, MD Inpatient   06/10/2017 2304 06/12/2017 1908 Full Code [id number]  Lance Coon, MD Inpatient   05/17/2017 0829 05/19/2017 1525 Full Code [id number]  Hillary Bow, MD ED   03/31/2017 1334 04/03/2017 1611 Full  Code [id number]  Nicholes Mango, MD Inpatient   10/21/2016 0155 10/25/2016 2145 Full Code [id number]  Hugelmeyer, [place], DO Inpatient   10/09/2016 2342 10/13/2016 2009 Full Code [id number]  Vaughan Basta, MD Inpatient   04/14/2015 1432 04/15/2015 1739 Full Code [id number]  Isaias Cowman, MD Inpatient   04/09/2015 0022 04/14/2015 1432 Full Code [id number]  Hower, Aaron Mose, MD ED   Advance Care Planning Activity     Family Communication: Spoke with Dr. Primus Bravo on the phone Disposition Plan: Status is: Inpatient  Dispo: The patient is from: Home              Anticipated d/c is to: Home with home health              Anticipated d/c date is: Earliest potential will be 7/27 versus 7/28 depending on clinical course.              Patient currently needs dialysis access an outpatient dialysis slot prior to disposition.  Consultants:  Vascular  Nephrology  Cardiology  Antibiotics:  Vancomycin  Zosyn  Time spent: 26 minutes, case discussed with nephrology and cardiology  [place]

## 2020-02-03 DIAGNOSIS — I480 Paroxysmal atrial fibrillation: Secondary | ICD-10-CM

## 2020-02-03 DIAGNOSIS — J90 Pleural effusion, not elsewhere classified: Secondary | ICD-10-CM | POA: Diagnosis not present

## 2020-02-03 DIAGNOSIS — T148XXA Other injury of unspecified body region, initial encounter: Secondary | ICD-10-CM | POA: Diagnosis not present

## 2020-02-03 DIAGNOSIS — N186 End stage renal disease: Secondary | ICD-10-CM | POA: Diagnosis not present

## 2020-02-03 LAB — GLUCOSE, CAPILLARY
Glucose-Capillary: 105 mg/dL — ABNORMAL HIGH (ref 70–99)
Glucose-Capillary: 107 mg/dL — ABNORMAL HIGH (ref 70–99)
Glucose-Capillary: 163 mg/dL — ABNORMAL HIGH (ref 70–99)
Glucose-Capillary: 178 mg/dL — ABNORMAL HIGH (ref 70–99)

## 2020-02-03 LAB — BASIC METABOLIC PANEL
Anion gap: 14 (ref 5–15)
BUN: 69 mg/dL — ABNORMAL HIGH (ref 8–23)
CO2: 27 mmol/L (ref 22–32)
Calcium: 7.8 mg/dL — ABNORMAL LOW (ref 8.9–10.3)
Chloride: 94 mmol/L — ABNORMAL LOW (ref 98–111)
Creatinine, Ser: 6.97 mg/dL — ABNORMAL HIGH (ref 0.61–1.24)
GFR calc Af Amer: 7 mL/min — ABNORMAL LOW (ref >60)
GFR calc non Af Amer: 6 mL/min — ABNORMAL LOW (ref >60)
Glucose, Bld: 120 mg/dL — ABNORMAL HIGH (ref 70–99)
Potassium: 4.2 mmol/L (ref 3.5–5.1)
Sodium: 135 mmol/L (ref 135–145)

## 2020-02-03 LAB — BODY FLUID CULTURE: Culture: NO GROWTH

## 2020-02-03 LAB — TYPE AND SCREEN
ABO/RH(D): B POS
Antibody Screen: NEGATIVE

## 2020-02-03 LAB — CBC
HCT: 26.6 % — ABNORMAL LOW (ref 39.0–52.0)
Hemoglobin: 8.7 g/dL — ABNORMAL LOW (ref 13.0–17.0)
MCH: 28.9 pg (ref 26.0–34.0)
MCHC: 32.7 g/dL (ref 30.0–36.0)
MCV: 88.4 fL (ref 80.0–100.0)
Platelets: 58 10*3/uL — ABNORMAL LOW (ref 150–400)
RBC: 3.01 MIL/uL — ABNORMAL LOW (ref 4.22–5.81)
RDW: 17.3 % — ABNORMAL HIGH (ref 11.5–15.5)
WBC: 6.3 10*3/uL (ref 4.0–10.5)
nRBC: 0 % (ref 0.0–0.2)

## 2020-02-03 LAB — PROTEIN, BODY FLUID (OTHER): Total Protein, Body Fluid Other: 1.8 g/dL

## 2020-02-03 NOTE — Progress Notes (Signed)
[facility]  ROUNDING NOTE   Subjective:  Pt seen at bedside. Daugther at bedside also.  Due for HD again tomorrow. Also due for permcath placement tomorrow.     Objective:  Vital signs in last 24 hours:  Temp:  [97.9 F (36.6 C)-98.6 F (37 C)] 97.9 F (36.6 C) (07/25 1139) Pulse Rate:  [85-107] 85 (07/25 1141) Resp:  [18-20] 18 (07/25 0459) BP: (103-116)/(63-78) 103/63 (07/25 1141) SpO2:  [100 %] 100 % (07/25 1141)  Weight change:  Filed Weights   01/31/20 0500 02/01/20 0439 02/02/20 0544  Weight: 92.8 kg (!) 93.1 kg (!) 92.5 kg    Intake/Output: I/O last 3 completed shifts: In: 480 [P.O.:480] Out: 200 [Urine:200]   Intake/Output this shift:  No intake/output data recorded.  Physical Exam: General: NAD   Head: Temporal wasting noted  Eyes: Anicteric  Neck: Supple, trachea midline  Lungs:  +rhonchi bilaterally, normal effort  Heart: irregular   Abdomen:  Soft, nontender, bowel sounds present  Extremities: Trace LE edema  Neurologic: Awake, alert, will answer yes/no questions  Skin: No lesions  Access: No access    Basic Metabolic Panel: Recent Labs  Lab 01/28/20 1017 01/28/20 1017 01/28/20 1520 01/28/20 1520 01/29/20 1030 01/29/20 1030 01/31/20 0541 02/01/20 1048 02/03/20 0652  NA 135   < > 135  --  138  --  139 137 135  K 3.8   < > 3.5  --  3.7  --  3.9 3.3* 4.2  CL 93*   < > 96*  --  97*  --  99 97* 94*  CO2 26   < > 25  --  28  --  28 33* 27  GLUCOSE 97   < > 132*  --  170*  --  130* 146* 120*  BUN 103*   < > 66*  --  67*  --  59* 34* 69*  CREATININE 7.92*   < > 5.88*  --  6.55*  --  6.46* 3.65* 6.97*  CALCIUM 7.8*   < > 7.7*   < > 7.8*   < > 7.8* 7.8* 7.8*  MG  --   --   --   --  2.4  --   --   --   --   PHOS 8.3*  --   --   --  5.9*  --   --  2.7  --    < > = values in this interval not displayed.    Liver Function Tests: Recent Labs  Lab 01/28/20 1017 01/29/20 1030 02/01/20 1048  ALBUMIN 2.7* 2.7* 2.5*   No results  for input(s): LIPASE, AMYLASE in the last 168 hours. No results for input(s): AMMONIA in the last 168 hours.  CBC: Recent Labs  Lab 01/28/20 1017 01/29/20 1030 02/01/20 1048 02/03/20 0652  WBC 5.1 5.7 7.9 6.3  HGB 9.7* 9.5* 9.3* 8.7*  HCT 29.7* 29.4* 29.4* 26.6*  MCV 87.9 88.6 90.2 88.4  PLT 52* 52* 53* 58*    Cardiac Enzymes: No results for input(s): CKTOTAL, CKMB, CKMBINDEX, TROPONINI in the last 168 hours.  BNP: Invalid input(s): POCBNP  CBG: Recent Labs  Lab 02/02/20 1204 02/02/20 1711 02/02/20 2135 02/03/20 0726 02/03/20 1137  GLUCAP 170* 159* 145* 105* 107*    Microbiology: Results for orders placed or performed during the hospital encounter of 01/23/20  Urine culture     Status: Abnormal   Collection Time: 01/23/20  9:40 AM   Specimen: Urine, Random  Result Value Ref Range Status   Specimen Description   Final    URINE, RANDOM Performed at [facility], [address] [place], [place] [address]    Special Requests   Final    NONE Performed at [facility], [place], [place] [address]    Culture 70,000 COLONIES/mL STAPHYLOCOCCUS EPIDERMIDIS (A)  Final   Report Status 01/26/2020 FINAL  Final   Organism ID, Bacteria STAPHYLOCOCCUS EPIDERMIDIS (A)  Final      Susceptibility   Staphylococcus epidermidis - MIC*    CIPROFLOXACIN >=8 RESISTANT Resistant     GENTAMICIN <=0.5 SENSITIVE Sensitive     NITROFURANTOIN <=16 SENSITIVE Sensitive     OXACILLIN >=4 RESISTANT Resistant     TETRACYCLINE 2 SENSITIVE Sensitive     VANCOMYCIN 1 SENSITIVE Sensitive     TRIMETH/SULFA 80 RESISTANT Resistant     CLINDAMYCIN <=0.25 SENSITIVE Sensitive     RIFAMPIN <=0.5 SENSITIVE Sensitive     Inducible Clindamycin NEGATIVE Sensitive     * 70,000 COLONIES/mL STAPHYLOCOCCUS EPIDERMIDIS  SARS Coronavirus 2 by RT PCR (hospital order, performed in [place] hospital lab) Nasopharyngeal Nasopharyngeal Swab     Status: None   Collection Time:  01/23/20  4:00 PM   Specimen: Nasopharyngeal Swab  Result Value Ref Range Status   SARS Coronavirus 2 NEGATIVE NEGATIVE Final    Comment: (NOTE) SARS-CoV-2 target nucleic acids are NOT DETECTED.  The SARS-CoV-2 RNA is generally detectable in upper and lower respiratory specimens during the acute phase of infection. The lowest concentration of SARS-CoV-2 viral copies this assay can detect is 250 copies / mL. A negative result does not preclude SARS-CoV-2 infection and should not be used as the sole basis for treatment or other patient management decisions.  A negative result may occur with improper specimen collection / handling, submission of specimen other than nasopharyngeal swab, presence of viral mutation(s) within the areas targeted by this assay, and inadequate number of viral copies (<250 copies / mL). A negative result must be combined with clinical observations, patient history, and epidemiological information.  Fact Sheet for Patients:   StrictlyIdeas.no  Fact Sheet for Healthcare Providers: BankingDealers.co.za  This test is not yet approved or  cleared by the [place] FDA and has been authorized for detection and/or diagnosis of SARS-CoV-2 by FDA under an Emergency Use Authorization (EUA).  This EUA will remain in effect (meaning this test can be used) for the duration of the COVID-19 declaration under Section 564(b)(1) of the Act, 21 U.S.C. section 360bbb-3(b)(1), unless the authorization is terminated or revoked sooner.  Performed at [facility], [address] [place], [place] [address]   CULTURE, BLOOD (ROUTINE X 2) w Reflex to ID Panel     Status: None   Collection Time: 01/23/20  5:13 PM   Specimen: BLOOD  Result Value Ref Range Status   Specimen Description BLOOD BLOOD LEFT HAND  Final   Special Requests   Final    BOTTLES DRAWN AEROBIC AND ANAEROBIC Blood Culture adequate volume   Culture    Final    NO GROWTH 5 DAYS Performed at [facility], [address] [place],  [address]    Report Status 01/28/2020 FINAL  Final  CULTURE, BLOOD (ROUTINE X 2) w Reflex to ID Panel     Status: None   Collection Time: 01/23/20  6:12 PM   Specimen: BLOOD  Result Value Ref Range Status   Specimen Description BLOOD BLOOD LEFT  WRIST  Final   Special Requests Blood Culture adequate volume  Final   Culture   Final    NO GROWTH 5 DAYS Performed at [facility], [address] [place], [place] [address]    Report Status 01/28/2020 FINAL  Final  MRSA PCR Screening     Status: Abnormal   Collection Time: 01/25/20  8:17 AM   Specimen: Nasopharyngeal  Result Value Ref Range Status   MRSA by PCR POSITIVE (A) NEGATIVE Final    Comment:        The GeneXpert MRSA Assay (FDA approved for NASAL specimens only), is one component of a comprehensive MRSA colonization surveillance program. It is not intended to diagnose MRSA infection nor to guide or monitor treatment for MRSA infections. RESULT CALLED TO, READ BACK BY AND VERIFIED WITH: MARIA [facility] 01/25/20 1945 SJL Performed at [facility], [address] [place], [place] [address]   Body fluid culture     Status: None   Collection Time: 01/30/20  2:40 PM   Specimen: PATH Cytology Pleural fluid  Result Value Ref Range Status   Specimen Description   Final    PLEURAL Performed at [facility], [address] [place], [place] [address]    Special Requests   Final    NONE Performed at [facility], [address] [place], [place] [address]    Gram Stain   Final    RARE WBC PRESENT, PREDOMINANTLY MONONUCLEAR NO ORGANISMS SEEN    Culture   Final    NO GROWTH 3 DAYS Performed at [facility], [place] [address] [place], [place] [address]    Report Status 02/03/2020 FINAL  Final  Urine Culture     Status: Abnormal   Collection Time: 01/31/20 10:30 AM   Specimen: Urine,  Random  Result Value Ref Range Status   Specimen Description   Final    URINE, RANDOM Performed at [facility], [address] [place] [address], [place], [place] [address]    Special Requests   Final    Normal Performed at [facility], [address] [place], [place] [address]    Culture MULTIPLE SPECIES PRESENT, SUGGEST RECOLLECTION (A)  Final   Report Status 02/01/2020 FINAL  Final  Body fluid culture     Status: None (Preliminary result)   Collection Time: 02/01/20  2:00 PM   Specimen: PATH Cytology Pleural fluid  Result Value Ref Range Status   Specimen Description   Final    PLEURAL Performed at [facility], [address] [place], [place] [address]    Special Requests   Final    NONE Performed at [facility], [place], [place] [address]    Gram Stain NO WBC SEEN NO ORGANISMS SEEN   Final   Culture   Final    NO GROWTH 2 DAYS Performed at [facility], [place] [address] [place],  [address]    Report Status PENDING  Incomplete    Coagulation Studies: No results for input(s): LABPROT, INR in the last 72 hours.  Urinalysis: No results for input(s): COLORURINE, LABSPEC, PHURINE, GLUCOSEU, HGBUR, BILIRUBINUR, KETONESUR, PROTEINUR, UROBILINOGEN, NITRITE, LEUKOCYTESUR in the last 72 hours.  Invalid input(s): APPERANCEUR    Imaging: No results found.   Medications:   . sodium chloride 10 mL/hr at 02/01/20 1800  . piperacillin-tazobactam (ZOSYN)  IV 2.25 g (02/03/20 0932)  . vancomycin     . ferrous sulfate  325  mg Oral Q breakfast   And  . vitamin C  250 mg Oral Q breakfast  . azelastine  1 spray Each Nare BID  . Chlorhexidine Gluconate Cloth  6 each Topical Q0600  . epoetin (EPOGEN/PROCRIT) injection  10,000 Units Intravenous Q T,Th,Sa-HD  . feeding supplement (ENSURE ENLIVE)  237 mL Oral BID BM  . finasteride  5 mg Oral Daily  . insulin aspart  0-5 Units Subcutaneous QHS  . insulin aspart  0-9 Units  Subcutaneous TID WC  . mouth rinse  15 mL Mouth Rinse BID  . metoprolol tartrate  50 mg Oral BID  . multivitamin  1 tablet Oral QHS  . pantoprazole  40 mg Oral Daily  . rosuvastatin  5 mg Oral QHS  . sodium bicarbonate  650 mg Oral BID  . tamsulosin  0.4 mg Oral Daily   sodium chloride, acetaminophen, albuterol, ammonium lactate, metoprolol tartrate, ondansetron (ZOFRAN) IV, vancomycin  Assessment/ Plan:  [name] is a 86 y.o. black male with acute renal failure on hemodialysis, hypertension, hyperlipidemia, diabetes mellitus type 2, GERD, obstructive sleep apnea, urothelial carcinoma of the bladder status post bilateral nephrostomy tube placement, permanent pacemaker placement secondary to sick sinus syndrome, SVT, who is admitted to [facility] on 01/23/2020 for Weakness [R53.1] SOB (shortness of breath) [R06.02] Acute renal failure superimposed on stage 4 chronic kidney disease ([place]) [N17.9, N18.4] Acute renal failure superimposed on chronic kidney disease, unspecified CKD stage, unspecified acute renal failure type ([place]) [N17.9, N18.9] Patient initiated on hemodialysis on 7/17 for uremic symptoms.   1. End Stage Renal Disease: Patient due for dialysis treatment again tomorrow.  We will prepare orders.  PermCath to be placed tomorrow.  Family requesting placement at [facility].  2. Anemia of chronic kidney disease.   Lab Results  Component Value Date   HGB 8.7 (L) 02/03/2020  Maintain Epogen 10,000 IV with dialysis treatments.  3. Hypertension -Continue metoprolol.  4. Bilateral pleural effusions: status post Right thoracentesis on 7/21 with 2.4 liters removed.  Left thoracentesis performed 02/01/2020.   LOS: 11 Tasneem Cormier 7/25/20214:32 PM

## 2020-02-03 NOTE — Progress Notes (Signed)
9 Days Post-Op   Subjective/Chief Complaint: Resting. Without complaint.   Objective: Vital signs in last 24 hours: Temp:  [97.8 F (36.6 C)-98.6 F (37 C)] 98.6 F (37 C) (07/25 0459) Pulse Rate:  [86-117] 86 (07/25 0920) Resp:  [18-20] 18 (07/25 0459) BP: (106-116)/(67-80) 116/71 (07/25 0920) SpO2:  [99 %-100 %] 100 % (07/25 0459) Last BM Date: 02/02/20  Intake/Output from previous day: 07/24 0701 - 07/25 0700 In: 240 [P.O.:240] Out: 80 [Urine:80] Intake/Output this shift: No intake/output data recorded.  General appearance: no distress Resp: clear to auscultation bilaterally Cardio: regular rate and rhythm  Lab Results:  Recent Labs    02/01/20 1048 02/03/20 0652  WBC 7.9 6.3  HGB 9.3* 8.7*  HCT 29.4* 26.6*  PLT 53* 58*   BMET Recent Labs    02/01/20 1048 02/03/20 0652  NA 137 135  K 3.3* 4.2  CL 97* 94*  CO2 33* 27  GLUCOSE 146* 120*  BUN 34* 69*  CREATININE 3.65* 6.97*  CALCIUM 7.8* 7.8*   PT/INR No results for input(s): LABPROT, INR in the last 72 hours. ABG No results for input(s): PHART, HCO3 in the last 72 hours.  Invalid input(s): PCO2, PO2  Studies/Results: DG Chest Port 1 View  Result Date: 02/01/2020 CLINICAL DATA:  Post left thoracentesis EXAM: PORTABLE CHEST 1 VIEW COMPARISON:  Radiograph 01/30/2020 FINDINGS: Interval decrease in size of the left-sided pleural effusions seen on comparison image. No pneumothorax. Persistent heterogeneous airspace opacities in both lungs, improved on the left likely reflecting lung re-expansion and diminished superimposed opacity from the pleural fluid. Persistent right pleural effusion. Central vascular congestion is similar to comparison. Stable cardiomediastinal contours with a calcified, tortuous aorta. Pacer pack overlies the left chest wall with leads at the right atrium and apex. No acute osseous or soft tissue abnormality. Degenerative changes are present in the imaged spine and shoulders.  IMPRESSION: 1. Interval decrease in size of left pleural effusion seen on comparison image. No pneumothorax. 2. Small to moderate right pleural effusion, appears slightly increased from prior. 3. Persistent bilateral heterogeneous airspace opacities, improved on the left likely related to the re-expansion and diminished superimposed opacity from the pleural fluid. Electronically Signed   By: Lovena Le M.D.   On: 02/01/2020 16:35   US THORACENTESIS ASP PLEURAL SPACE W/IMG GUIDE  Result Date: 02/01/2020 INDICATION: Pleural effusions, shortness of breath EXAM: ULTRASOUND GUIDED LEFT THORACENTESIS MEDICATIONS: 1% lidocaine local COMPLICATIONS: None immediate. PROCEDURE: An ultrasound guided thoracentesis was thoroughly discussed with the patient and questions answered. The benefits, risks, alternatives and complications were also discussed. The patient understands and wishes to proceed with the procedure. Written consent was obtained. Ultrasound was performed to localize and mark an adequate pocket of fluid in the left chest. The area was then prepped and draped in the normal sterile fashion. 1% Lidocaine was used for local anesthesia. Under ultrasound guidance a 6 Fr Safe-T-Centesis catheter was introduced. Thoracentesis was performed. The catheter was removed and a dressing applied. FINDINGS: A total of approximately 750 cc of serosanguineous pleural fluid was removed. Samples were sent to the laboratory as requested by the clinical team. IMPRESSION: Successful ultrasound guided left thoracentesis yielding 750 cc of pleural fluid. Electronically Signed   By: Jerilynn Mages.  Shick M.D.   On: 02/01/2020 14:52    Anti-infectives: Anti-infectives (From admission, onward)   Start     Dose/Rate Route Frequency Ordered Stop   02/01/20 1245  vancomycin (VANCOCIN) IVPB 1000 mg/200 mL premix  Discontinue     1,000 mg 200 mL/hr over 60 Minutes Intravenous Every Dialysis 02/01/20 1234     02/01/20 1200  vancomycin  (VANCOCIN) IVPB 1000 mg/200 mL premix        1,000 mg 200 mL/hr over 60 Minutes Intravenous  Once 02/01/20 1051 02/01/20 1617   01/31/20 2200  vancomycin (VANCOCIN) IVPB 1000 mg/200 mL premix  Status:  Discontinued        1,000 mg 200 mL/hr over 60 Minutes Intravenous Every T-Th-Sa (Hemodialysis) 01/31/20 1333 02/01/20 1234   01/31/20 0900  vancomycin (VANCOREADY) IVPB 1750 mg/350 mL        1,750 mg 175 mL/hr over 120 Minutes Intravenous  Once 01/31/20 0826 01/31/20 1204   01/31/20 0900  piperacillin-tazobactam (ZOSYN) IVPB 2.25 g     Discontinue     2.25 g 100 mL/hr over 30 Minutes Intravenous Every 8 hours 01/31/20 0830     01/23/20 1700  aztreonam (AZACTAM) 0.5 g in dextrose 5 % 50 mL IVPB  Status:  Discontinued        0.5 g 100 mL/hr over 30 Minutes Intravenous Every 8 hours 01/23/20 1543 01/26/20 1229      Assessment/Plan: s/p Procedure(s): TEMPORARY DIALYSIS CATHETER (N/A) Plan for placement of Tunneled HD catheter tomorrow  NPO after MN  LOS: 11 days    Blake [name] A 02/03/2020

## 2020-02-03 NOTE — Progress Notes (Signed)
Patient Name: Blake [name] Date of Encounter: 02/03/2020  Hospital Problem List     Principal Problem:   Acute renal failure superimposed on stage 4 chronic kidney disease (HCC) Active Problems:   SVT (supraventricular tachycardia) (HCC)   Atrial fibrillation with RVR (HCC)   HLD (hyperlipidemia)   Nephrostomy tube bleed (HCC)   Pressure injury, stage 3 (HCC)   Recurrent UTI   Urothelial carcinoma of bladder (HCC)   Thrombocytopenia (HCC)   Acute on chronic diastolic CHF (congestive heart failure) (HCC)   HTN (hypertension)   Type II diabetes mellitus with renal manifestations (HCC)   Acute respiratory failure with hypoxia (HCC)   Normocytic anemia   Protein-calorie malnutrition, severe   Pleural effusion   Pressure injury of buttock, stage 2 (HCC)   ESRD (end stage renal disease) ([facility])   Drainage from wound   Tachycardia   Weakness   Non-sustained ventricular tachycardia [facility])    Patient Profile     86 y.o. male with history of sss s/p ppm, afib, dm, sleep apnea without use of cpap, reduced lv funciton with ef of 48% by echo in 2014, aaa 5/4.6 cm who was admitted January 23, 2020 with reduced urine output.  Had a history of bladder carcinoma status post bilateral nephrostomy.  BNP on presentation was 2109.  Creatinine was 7.8 up from 3.69.  Has undergone hemodialysis via a groin temporary catheter.  Patient is a very difficult historian and does not answer questions appropriately.  Had SVT with tachycardia.  Metoprolol was increased to 50 mg twice daily.  Had a run of nonsustained wide-complex tachycardia.  He is status post bilateral thoracentesis.  Potassium was 3.3 yesterday.  With a creatinine improved to 3.65.  Hemoglobin 9.3.  BNP is increased to 4000.  Echo done on a year ago showed preserved LV function.  Subjective   A little less lethargic this am.  Inpatient Medications    . ferrous sulfate  325 mg Oral Q breakfast   And  . vitamin C  250 mg Oral Q breakfast   . azelastine  1 spray Each Nare BID  . Chlorhexidine Gluconate Cloth  6 each Topical Q0600  . epoetin (EPOGEN/PROCRIT) injection  10,000 Units Intravenous Q T,Th,Sa-HD  . feeding supplement (ENSURE ENLIVE)  237 mL Oral BID BM  . finasteride  5 mg Oral Daily  . insulin aspart  0-5 Units Subcutaneous QHS  . insulin aspart  0-9 Units Subcutaneous TID WC  . mouth rinse  15 mL Mouth Rinse BID  . metoprolol tartrate  50 mg Oral BID  . multivitamin  1 tablet Oral QHS  . pantoprazole  40 mg Oral Daily  . rosuvastatin  5 mg Oral QHS  . sodium bicarbonate  650 mg Oral BID  . tamsulosin  0.4 mg Oral Daily    Vital Signs    Vitals:   02/02/20 1202 02/02/20 1928 02/03/20 0459 02/03/20 0920  BP: 108/80 106/78 110/67 116/71  Pulse: (!) 117 (!) 107 86 86  Resp: 18 20 18    Temp: 97.8 F (36.6 C) 98.4 F (36.9 C) 98.6 F (37 C)   TempSrc: Oral Oral Oral   SpO2: 99% 100% 100%   Weight:      Height:        Intake/Output Summary (Last 24 hours) at 02/03/2020 1029 Last data filed at 02/03/2020 0600 Gross per 24 hour  Intake 240 ml  Output 80 ml  Net 160 ml   [employer]  01/31/20 0500 02/01/20 0439 02/02/20 0544  Weight: 92.8 kg (!) 93.1 kg (!) 92.5 kg    Physical Exam    GEN: Well nourished, well developed, in no acute distress.  HEENT: normal.  Neck: Supple, no JVD, carotid bruits, or masses. Cardiac: RRR, no murmurs, rubs, or gallops. No clubbing, cyanosis, edema.  Radials/DP/PT 2+ and equal bilaterally.  Respiratory:  Respirations regular and unlabored, clear to auscultation bilaterally. GI: Soft, nontender, nondistended, BS + x 4. MS: no deformity or atrophy. Skin: warm and dry, no rash. Neuro:  Strength and sensation are intact. Psych: Normal affect.  Labs    CBC Recent Labs    02/01/20 1048 02/03/20 0652  WBC 7.9 6.3  HGB 9.3* 8.7*  HCT 29.4* 26.6*  MCV 90.2 88.4  PLT 53* 58*   Basic Metabolic Panel Recent Labs    02/01/20 1048 02/03/20 0652  NA 137 135   K 3.3* 4.2  CL 97* 94*  CO2 33* 27  GLUCOSE 146* 120*  BUN 34* 69*  CREATININE 3.65* 6.97*  CALCIUM 7.8* 7.8*  PHOS 2.7  --    Liver Function Tests Recent Labs    02/01/20 1048  ALBUMIN 2.5*   No results for input(s): LIPASE, AMYLASE in the last 72 hours. Cardiac Enzymes No results for input(s): CKTOTAL, CKMB, CKMBINDEX, TROPONINI in the last 72 hours. BNP No results for input(s): BNP in the last 72 hours. D-Dimer No results for input(s): DDIMER in the last 72 hours. Hemoglobin A1C No results for input(s): HGBA1C in the last 72 hours. Fasting Lipid Panel No results for input(s): CHOL, HDL, LDLCALC, TRIG, CHOLHDL, LDLDIRECT in the last 72 hours. Thyroid Function Tests No results for input(s): TSH, T4TOTAL, T3FREE, THYROIDAB in the last 72 hours.  Invalid input(s): FREET3  Telemetry    afib with variable vr  ECG    afib  Radiology    DG Chest 1 View  Result Date: 01/30/2020 CLINICAL DATA:  Follow-up right thoracentesis EXAM: CHEST  1 VIEW COMPARISON:  01/29/2020 FINDINGS: Dual lead pacemaker unchanged. Cardiomegaly and aortic atherosclerosis. Marked improvement on the right with only a small amount of residual pleural density and mild volume loss in the right lower lobe. Persistent left effusion and left lower lung atelectasis. IMPRESSION: Marked radiographic improvement following right thoracentesis. Small amount of remaining pleural fluid on the right with mild volume loss in the right lower lobe. No postprocedural pneumothorax. No change in left effusion with areas of atelectasis in the left lung. Electronically Signed   By: Nelson Chimes M.D.   On: 01/30/2020 17:03   DG Chest 1 View  Result Date: 01/29/2020 CLINICAL DATA:  86 year old male with tachycardia. EXAM: CHEST  1 VIEW COMPARISON:  Chest radiograph dated 01/23/2020. FINDINGS: There is near complete opacification the right hemithorax consistent with a large pleural effusion with associated near complete  compressive atelectasis of the right lung. Pneumonia or underlying mass is not excluded. Clinical correlation and follow-up to resolution recommended. Overall significant interval progression of the right lung opacity compared to the radiograph of 01/23/2020. There is only a small aerated portion of the right lung. Interval increase in the size of the left pleural effusion with increase in the left lung base atelectasis or infiltrate. No pneumothorax. Stable cardiomegaly. Atherosclerotic calcification of the aorta. Left pectoral pacemaker device. Degenerative changes of the spine. No acute osseous pathology. IMPRESSION: 1. Large right pleural effusion with near complete compressive atelectasis of the right lung. Overall significant progression of right lung opacity  since the prior radiograph. Pneumonia or underlying mass is not excluded. Clinical correlation and follow-up to resolution recommended. 2. Interval increase in the size of the left pleural effusion and left lung base atelectasis or infiltrate. Electronically Signed   By: Anner Crete M.D.   On: 01/29/2020 21:05   US Renal  Result Date: 01/23/2020 CLINICAL DATA:  Renal failure. History of bladder cancer. History of prostate cancer. EXAM: RENAL / URINARY TRACT ULTRASOUND COMPLETE COMPARISON:  CT scan 11/03/2019 FINDINGS: Right Kidney: Renal measurements: 8.3 x 4.0 x 4.0 cm = volume: 69.53 mL. Increased echogenicity and renal cortical scarring type changes. A right nephrostomy tube is noted. No hydronephrosis. No worrisome renal lesions are identified. There is a small cyst noted in the midpole region. Left Kidney: Renal measurements: 11.0 x 5.8 x 5.6 cm = volume: 185.88 mL. Slight increased echogenicity but normal renal cortical thickness. A nephrostomy tube is noted. No hydronephrosis. No worrisome renal lesions. Bladder: Slightly thick-walled bladder with echogenic material which could represent hematoma or mass. Other: Small bilateral pleural  effusions are noted along with trace ascites. IMPRESSION: 1. Small right kidney and normal size left kidney. 2. Bilateral nephrostomy tubes.  No hydronephrosis. 3. Indeterminate echogenic material in the bladder could be hematoma or mass. 4. Small bilateral pleural effusions. Electronically Signed   By: Marijo Sanes M.D.   On: 01/23/2020 12:49   PERIPHERAL VASCULAR CATHETERIZATION  Result Date: 01/28/2020 See op note  DG Chest Port 1 View  Result Date: 02/01/2020 CLINICAL DATA:  Post left thoracentesis EXAM: PORTABLE CHEST 1 VIEW COMPARISON:  Radiograph 01/30/2020 FINDINGS: Interval decrease in size of the left-sided pleural effusions seen on comparison image. No pneumothorax. Persistent heterogeneous airspace opacities in both lungs, improved on the left likely reflecting lung re-expansion and diminished superimposed opacity from the pleural fluid. Persistent right pleural effusion. Central vascular congestion is similar to comparison. Stable cardiomediastinal contours with a calcified, tortuous aorta. Pacer pack overlies the left chest wall with leads at the right atrium and apex. No acute osseous or soft tissue abnormality. Degenerative changes are present in the imaged spine and shoulders. IMPRESSION: 1. Interval decrease in size of left pleural effusion seen on comparison image. No pneumothorax. 2. Small to moderate right pleural effusion, appears slightly increased from prior. 3. Persistent bilateral heterogeneous airspace opacities, improved on the left likely related to the re-expansion and diminished superimposed opacity from the pleural fluid. Electronically Signed   By: Lovena Le M.D.   On: 02/01/2020 16:35   DG Chest Port 1 View  Result Date: 01/23/2020 CLINICAL DATA:  Shortness of breath EXAM: PORTABLE CHEST 1 VIEW COMPARISON:  September 24, 2019 FINDINGS: There is stable cardiomegaly. Aortic knob calcifications. Hazy interstitial opacities are seen throughout both lungs right greater than  left. There is a moderate right effusion present. Hazy airspace opacity at the right lung base, likely layering effusions/atelectasis. A left-sided pacemaker again noted. No acute osseous abnormality. IMPRESSION: Findings suggestive of interstitial edema. Moderate right pleural effusion with adjacent hazy airspace opacity, likely layering effusions/atelectasis. Electronically Signed   By: Prudencio Pair M.D.   On: 01/23/2020 15:45   IR NEPHROSTOMY EXCHANGE RIGHT  Result Date: 01/31/2020 INDICATION: Decreased and bloody output from right nephrostomy tube. EXAM: RIGHT NEPHROSTOMY TUBE CHECK. COMPARISON:  ULTRASOUND 01/23/2020. Nephrostomy tube exchange 12/28/2019. MEDICATIONS: None. ANESTHESIA/SEDATION: None. CONTRAST:  Ten cc of nonionic-administered into the collecting system(s) FLUOROSCOPY TIME:  Fluoroscopy Time: 0 minutes 20 seconds (5.7 mGy). COMPLICATIONS: None immediate. PROCEDURE: Informed written consent was  obtained from the patient after a thorough discussion of the procedural risks, benefits and alternatives. All questions were addressed. Maximal Sterile Barrier Technique was utilized including caps, mask, sterile gowns, sterile gloves, sterile drape, hand hygiene and skin antiseptic. A timeout was performed prior to the initiation of the procedure. Right nephrostomy tube in stable position. Nephrostomy tube was suction and Nonionic contrast was injected into the nephrostomy tube. Nephrostomy tube is widely patent. Nephrostomy tube in stable position within the renal collecting system. The tube was placed to gravity drainage. IMPRESSION: Right nephrostomy tube aspirated. Contrast administered demonstrating widely patent right nephrostomy tube in good and stable position. No tube exchange was needed. Electronically Signed   By: Marcello Moores  Register   On: 01/31/2020 15:23   US THORACENTESIS ASP PLEURAL SPACE W/IMG GUIDE  Result Date: 02/01/2020 INDICATION: Pleural effusions, shortness of breath EXAM:  ULTRASOUND GUIDED LEFT THORACENTESIS MEDICATIONS: 1% lidocaine local COMPLICATIONS: None immediate. PROCEDURE: An ultrasound guided thoracentesis was thoroughly discussed with the patient and questions answered. The benefits, risks, alternatives and complications were also discussed. The patient understands and wishes to proceed with the procedure. Written consent was obtained. Ultrasound was performed to localize and mark an adequate pocket of fluid in the left chest. The area was then prepped and draped in the normal sterile fashion. 1% Lidocaine was used for local anesthesia. Under ultrasound guidance a 6 Fr Safe-T-Centesis catheter was introduced. Thoracentesis was performed. The catheter was removed and a dressing applied. FINDINGS: A total of approximately 750 cc of serosanguineous pleural fluid was removed. Samples were sent to the laboratory as requested by the clinical team. IMPRESSION: Successful ultrasound guided left thoracentesis yielding 750 cc of pleural fluid. Electronically Signed   By: Jerilynn Mages.  Shick M.D.   On: 02/01/2020 14:52   US THORACENTESIS ASP PLEURAL SPACE W/IMG GUIDE  Result Date: 01/30/2020 INDICATION: 86 year old male with a history of pleural effusion, referred for thoracentesis EXAM: ULTRASOUND GUIDED RIGHT THORACENTESIS MEDICATIONS: None. COMPLICATIONS: None PROCEDURE: An ultrasound guided thoracentesis was thoroughly discussed with the patient and questions answered. The benefits, risks, alternatives and complications were also discussed. The patient understands and wishes to proceed with the procedure. Written consent was obtained. Ultrasound was performed to localize and mark an adequate pocket of fluid in the right chest. The area was then prepped and draped in the normal sterile fashion. 1% Lidocaine was used for local anesthesia. Under ultrasound guidance a 8 Fr Safe-T-Centesis catheter was introduced. Thoracentesis was performed. The catheter was removed and a dressing applied.  FINDINGS: A total of approximately 2400 cc of thin yellow fluid was removed. Samples were sent to the laboratory as requested by the clinical team. IMPRESSION: Status post ultrasound-guided right-sided thoracentesis. Electronically Signed   By: Corrie Mckusick D.O.   On: 01/30/2020 15:35    Assessment & Plan    86 year old male with history of multiple medical problems including sick sinus syndrome status post permanent pacemaker, history of acute on chronic renal insufficiency requiring hemodialysis , large bilateral pleural effusions s/p thoracentesis, who was noted to have an episode of nonsustained wide-complex tachycardia.  Has atrial fibrillation.    AFIB-Rhythm strip reviewed.  Ventricular arrhythmia versus aberrant atrial fibrillation.  Patient has a backup pacemaker therefore would recommend attempting beta-blocker therapy to rate control and treat this rhythm.  His potassium was 3.3.  We will attempt to keep this greater than 4. No further wide runds noted past 24 hours. Will follow on current meds  CKD-on HD.   Signed, Javier Docker  Roberta Kelly MD 02/03/2020, 10:29 AM  Pager: [phone]

## 2020-02-03 NOTE — Progress Notes (Signed)
Patient ID: Blake [name], male   DOB: [birth year], 86 y.o.   MRN: [id number] [facility] PROGRESS NOTE  Blake [name] WVP:[id number] DOB: 24-Dec-1922 DOA: 01/23/2020 PCP: Adin Hector, MD  HPI/Subjective: Patient seen this morning.  Answers some questions.  No complaints of chest pain or abdominal pain.  Breathing better than he was previously.  Admitted initially with acute on chronic kidney injury and decreased urine output.  Objective: Vitals:   02/03/20 1139 02/03/20 1141  BP:  (!) 103/63  Pulse: 85 85  Resp:    Temp: 97.9 F (36.6 C)   SpO2: 100% 100%    Intake/Output Summary (Last 24 hours) at 02/03/2020 1443 Last data filed at 02/03/2020 0600 Gross per 24 hour  Intake 240 ml  Output 80 ml  Net 160 ml   Filed Weights   01/31/20 0500 02/01/20 0439 02/02/20 0544  Weight: 92.8 kg (!) 93.1 kg (!) 92.5 kg    ROS: Review of Systems  Respiratory: Positive for shortness of breath.   Cardiovascular: Negative for chest pain.  Gastrointestinal: Negative for abdominal pain.   Exam: Physical Exam HENT:     Nose: No mucosal edema.     Mouth/Throat:     Pharynx: No oropharyngeal exudate.  Eyes:     General: Lids are normal.     Conjunctiva/sclera: Conjunctivae normal.  Cardiovascular:     Rate and Rhythm: Normal rate and regular rhythm.     Heart sounds: Normal heart sounds, S1 normal and S2 normal.  Pulmonary:     Breath sounds: Examination of the right-lower field reveals decreased breath sounds and rhonchi. Examination of the left-lower field reveals decreased breath sounds and rhonchi. Decreased breath sounds and rhonchi present. No wheezing or rales.  Abdominal:     Palpations: Abdomen is soft.     Tenderness: There is no abdominal tenderness.  Musculoskeletal:     Right ankle: Swelling present.     Left ankle: Swelling present.  Skin:    General: Skin is warm.     Findings: No rash.  Neurological:     Mental Status: He is alert.       Data  Reviewed: Basic Metabolic Panel: Recent Labs  Lab 01/28/20 1017 01/28/20 1017 01/28/20 1520 01/29/20 1030 01/31/20 0541 02/01/20 1048 02/03/20 0652  NA 135   < > 135 138 139 137 135  K 3.8   < > 3.5 3.7 3.9 3.3* 4.2  CL 93*   < > 96* 97* 99 97* 94*  CO2 26   < > 25 28 28  33* 27  GLUCOSE 97   < > 132* 170* 130* 146* 120*  BUN 103*   < > 66* 67* 59* 34* 69*  CREATININE 7.92*   < > 5.88* 6.55* 6.46* 3.65* 6.97*  CALCIUM 7.8*   < > 7.7* 7.8* 7.8* 7.8* 7.8*  MG  --   --   --  2.4  --   --   --   PHOS 8.3*  --   --  5.9*  --  2.7  --    < > = values in this interval not displayed.   Liver Function Tests: Recent Labs  Lab 01/28/20 1017 01/29/20 1030 02/01/20 1048  ALBUMIN 2.7* 2.7* 2.5*   CBC: Recent Labs  Lab 01/28/20 1017 01/29/20 1030 02/01/20 1048 02/03/20 0652  WBC 5.1 5.7 7.9 6.3  HGB 9.7* 9.5* 9.3* 8.7*  HCT 29.7* 29.4* 29.4* 26.6*  MCV 87.9 88.6 90.2 88.4  PLT 52* 52* 53* 58*   BNP (last 3 results) Recent Labs    09/24/19 1530 01/23/20 0948 01/29/20 1046  BNP 982.0* 2,109.0* 4,446.4*    CBG: Recent Labs  Lab 02/02/20 1204 02/02/20 1711 02/02/20 2135 02/03/20 0726 02/03/20 1137  GLUCAP 170* 159* 145* 105* 107*    Recent Results (from the past 240 hour(s))  MRSA PCR Screening     Status: Abnormal   Collection Time: 01/25/20  8:17 AM   Specimen: Nasopharyngeal  Result Value Ref Range Status   MRSA by PCR POSITIVE (A) NEGATIVE Final    Comment:        The GeneXpert MRSA Assay (FDA approved for NASAL specimens only), is one component of a comprehensive MRSA colonization surveillance program. It is not intended to diagnose MRSA infection nor to guide or monitor treatment for MRSA infections. RESULT CALLED TO, READ BACK BY AND VERIFIED WITH: MARIA [facility] 01/25/20 1945 SJL Performed at [facility], [address] [place], [place] [address]   Body fluid culture     Status: None   Collection Time: 01/30/20  2:40 PM   Specimen:  PATH Cytology Pleural fluid  Result Value Ref Range Status   Specimen Description   Final    PLEURAL Performed at [facility], [address], [place], [place] [address]    Special Requests   Final    NONE Performed at [facility], [address] [place], [place] [address]    Gram Stain   Final    RARE WBC PRESENT, PREDOMINANTLY MONONUCLEAR NO ORGANISMS SEEN    Culture   Final    NO GROWTH 3 DAYS Performed at [facility], [place] [address] [place], [place] [address]    Report Status 02/03/2020 FINAL  Final  Urine Culture     Status: Abnormal   Collection Time: 01/31/20 10:30 AM   Specimen: Urine, Random  Result Value Ref Range Status   Specimen Description   Final    URINE, RANDOM Performed at [facility], [address], [place], [place] [address]    Special Requests   Final    Normal Performed at [facility], [address] [place], [place] [address]    Culture MULTIPLE SPECIES PRESENT, SUGGEST RECOLLECTION (A)  Final   Report Status 02/01/2020 FINAL  Final  Body fluid culture     Status: None (Preliminary result)   Collection Time: 02/01/20  2:00 PM   Specimen: PATH Cytology Pleural fluid  Result Value Ref Range Status   Specimen Description   Final    PLEURAL Performed at [facility], [address] [place], [place] [address]    Special Requests   Final    NONE Performed at [facility], [place], [place] [address]    Gram Stain NO WBC SEEN NO ORGANISMS SEEN   Final   Culture   Final    NO GROWTH 2 DAYS Performed at [facility], [place] [address], [place], [place] [address]    Report Status PENDING  Incomplete      Scheduled Meds: . ferrous sulfate  325 mg Oral Q breakfast   And  . vitamin C  250 mg Oral Q breakfast  . azelastine  1 spray Each Nare BID  . Chlorhexidine Gluconate Cloth  6 each Topical Q0600  . epoetin (EPOGEN/PROCRIT) injection  10,000 Units  Intravenous Q T,Th,Sa-HD  . feeding supplement (ENSURE ENLIVE)  237 mL  Oral BID BM  . finasteride  5 mg Oral Daily  . insulin aspart  0-5 Units Subcutaneous QHS  . insulin aspart  0-9 Units Subcutaneous TID WC  . mouth rinse  15 mL Mouth Rinse BID  . metoprolol tartrate  50 mg Oral BID  . multivitamin  1 tablet Oral QHS  . pantoprazole  40 mg Oral Daily  . rosuvastatin  5 mg Oral QHS  . sodium bicarbonate  650 mg Oral BID  . tamsulosin  0.4 mg Oral Daily   Continuous Infusions: . sodium chloride 10 mL/hr at 02/01/20 1800  . piperacillin-tazobactam (ZOSYN)  IV 2.25 g (02/03/20 0932)  . vancomycin      Assessment/Plan:  1. End-stage renal disease.  Vascular surgery to put in PermCath tomorrow.  Patient will need to tolerate dialysis sitting up. 2. Purulent drainage right nephrostomy tube.  Empiric antibiotics 3. Bilateral pleural effusions.  Right thoracentesis removed 2400 mL on 01/30/2020 and left thoracentesis removed 750 mL on 02/01/2020.  Patient has a high likelihood of fluid reaccumulating.  May end up needing serial thoracentesis as outpatient.  Hopefully dialysis can manage fluid. 4. Paroxysmal atrial fibrillation and SVT.  On metoprolol 50 mg twice a day. 5. Nonsustained ventricular tachycardia versus aberrant atrial fibrillation.  No further episodes overnight. 6. Acute hypoxic respiratory failure on oxygen supplementation.   Currently on 2 L. 7. Acute on chronic diastolic congestive heart failure.  Dialysis to remove fluid.  8. Thrombocytopenia.  Platelet count 58. 9. Anemia of chronic disease 10. Chronic decubitus ulcer present on admission stage II on sacrum. 11. Weakness.  Physical therapy evaluation.    Code Status:     Code Status Orders  (From admission, onward)         Start     Ordered   01/23/20 1517  Full code  Continuous        01/23/20 1516        Code Status History    Date Active Date Inactive Code Status Order ID Comments User Context    11/04/2019 0118 11/05/2019 2145 Full Code [id number]  Neena Rhymes, MD ED   11/04/2019 0054 11/04/2019 0118 DNR [id number]  Neena Rhymes, MD ED   09/24/2019 2132 09/28/2019 2309 Full Code [id number]  Orene Desanctis, DO ED   01/03/2019 0142 01/09/2019 2238 Full Code [id number]  Mansy, Arvella Merles, MD ED   12/25/2018 1233 01/02/2019 1950 Full Code [id number]  Dustin Flock, MD Inpatient   12/25/2018 0645 12/25/2018 1233 Full Code [id number]  Paulette Blanch, MD ED   11/21/2018 2217 11/29/2018 2052 Full Code [id number]  Mayer Camel, NP ED   11/05/2018 1755 11/09/2018 1728 Full Code [id number]  Mayo, Pete Pelt, MD Inpatient   09/10/2017 1430 09/13/2017 2115 Full Code [id number]  Idelle Crouch, MD Inpatient   06/10/2017 2304 06/12/2017 1908 Full Code [id number]  Lance Coon, MD Inpatient   05/17/2017 0829 05/19/2017 1525 Full Code [id number]  Hillary Bow, MD ED   03/31/2017 1334 04/03/2017 1611 Full Code [id number]  Nicholes Mango, MD Inpatient   10/21/2016 0155 10/25/2016 2145 Full Code [id number]  Hugelmeyer, Cash[place], DO Inpatient   10/09/2016 2342 10/13/2016 2009 Full Code [id number]  Vaughan Basta, MD Inpatient   04/14/2015 1432 04/15/2015 1739 Full Code [id number]  Isaias Cowman, MD Inpatient   04/09/2015 0022 04/14/2015 1432 Full Code [id number]  Hower, Aaron Mose, MD ED   Advance Care Planning Activity     Family Communication: Spoke with Dr. Primus Bravo  on the phone Disposition Plan: Status is: Inpatient  Dispo: The patient is from: Home              Anticipated d/c is to: Home with home health              Anticipated d/c date is: Need to see how he does with permacath placement and tolerating dialysis sitting up.              Patient currently needs dialysis access and an outpatient dialysis slot prior to disposition.  Consultants:  Vascular  Nephrology  Cardiology  Antibiotics:  Vancomycin  Zosyn  Time spent: 27 minutes.  [place]  [facility] [employer]

## 2020-02-03 NOTE — Progress Notes (Signed)
PT Cancellation Note  Patient Details Name: JAYVYN HASELTON MRN: [id number] DOB: 1922/10/08   Cancelled Treatment:    Reason Eval/Treat Not Completed: Patient not medically ready. Patient due for procedure tomorrow to remove temp fem cath and replace with more permanent access. Given the restrictions of temp fem cath (bed level mobility only) will hold eval until after procedure tomorrow.    Royce Macadamia PT, DPT, CSCS    02/03/2020, 1:12 PM

## 2020-02-04 ENCOUNTER — Encounter: Payer: Self-pay | Admitting: Certified Registered Nurse Anesthetist

## 2020-02-04 ENCOUNTER — Other Ambulatory Visit (INDEPENDENT_AMBULATORY_CARE_PROVIDER_SITE_OTHER): Payer: Self-pay | Admitting: Vascular Surgery

## 2020-02-04 ENCOUNTER — Encounter
Admission: EM | Disposition: A | Discharge status: Skilled Nursing Facility | Payer: Self-pay | Source: Home / Self Care | Attending: Internal Medicine

## 2020-02-04 DIAGNOSIS — N179 Acute kidney failure, unspecified: Secondary | ICD-10-CM | POA: Diagnosis not present

## 2020-02-04 DIAGNOSIS — I480 Paroxysmal atrial fibrillation: Secondary | ICD-10-CM | POA: Diagnosis not present

## 2020-02-04 DIAGNOSIS — Z515 Encounter for palliative care: Secondary | ICD-10-CM | POA: Diagnosis not present

## 2020-02-04 DIAGNOSIS — R58 Hemorrhage, not elsewhere classified: Secondary | ICD-10-CM | POA: Diagnosis not present

## 2020-02-04 DIAGNOSIS — J90 Pleural effusion, not elsewhere classified: Secondary | ICD-10-CM | POA: Diagnosis not present

## 2020-02-04 DIAGNOSIS — N185 Chronic kidney disease, stage 5: Secondary | ICD-10-CM

## 2020-02-04 DIAGNOSIS — N186 End stage renal disease: Secondary | ICD-10-CM | POA: Diagnosis not present

## 2020-02-04 DIAGNOSIS — E43 Unspecified severe protein-calorie malnutrition: Secondary | ICD-10-CM | POA: Diagnosis not present

## 2020-02-04 DIAGNOSIS — Z7189 Other specified counseling: Secondary | ICD-10-CM | POA: Diagnosis not present

## 2020-02-04 HISTORY — PX: DIALYSIS/PERMA CATHETER INSERTION: CATH118288

## 2020-02-04 LAB — GLUCOSE, CAPILLARY
Glucose-Capillary: 119 mg/dL — ABNORMAL HIGH (ref 70–99)
Glucose-Capillary: 114 mg/dL — ABNORMAL HIGH (ref 70–99)
Glucose-Capillary: 110 mg/dL — ABNORMAL HIGH (ref 70–99)
Glucose-Capillary: 117 mg/dL — ABNORMAL HIGH (ref 70–99)
Glucose-Capillary: 193 mg/dL — ABNORMAL HIGH (ref 70–99)

## 2020-02-04 LAB — CYTOLOGY - NON PAP

## 2020-02-04 SURGERY — DIALYSIS/PERMA CATHETER INSERTION
Anesthesia: Moderate Sedation | Laterality: Right

## 2020-02-04 MED ORDER — SODIUM CHLORIDE 0.9 % IV SOLN: INTRAVENOUS | Status: DC

## 2020-02-04 MED ORDER — ACETAMINOPHEN 325 MG PO TABS
650.0000 mg | ORAL_TABLET | Freq: Once | ORAL | Status: AC
  Administered 2020-02-04: 650 mg via ORAL
  Filled 2020-02-04: qty 2

## 2020-02-04 MED ORDER — LIDOCAINE-EPINEPHRINE (PF) 1 %-1:200000 IJ SOLN
INTRAMUSCULAR | Status: DC | PRN
  Administered 2020-02-04: 20 mL

## 2020-02-04 MED ORDER — HEPARIN (PORCINE) IN NACL 1000-0.9 UT/500ML-% IV SOLN
INTRAVENOUS | Status: DC | PRN
  Administered 2020-02-04: 500 mL

## 2020-02-04 MED ORDER — FUROSEMIDE 10 MG/ML IJ SOLN
80.0000 mg | Freq: Once | INTRAMUSCULAR | Status: AC
  Administered 2020-02-04: 80 mg via INTRAVENOUS
  Filled 2020-02-04: qty 8

## 2020-02-04 MED ORDER — CLINDAMYCIN PHOSPHATE 300 MG/50ML IV SOLN
300.0000 mg | Freq: Once | INTRAVENOUS | Status: DC
  Administered 2020-02-04: 300 mg via INTRAVENOUS

## 2020-02-04 MED ORDER — MIDAZOLAM HCL 2 MG/ML PO SYRP: 8.0000 mg | ORAL_SOLUTION | Freq: Once | ORAL | Status: DC | PRN

## 2020-02-04 MED ORDER — FAMOTIDINE 20 MG PO TABS: 40.0000 mg | ORAL_TABLET | Freq: Once | ORAL | Status: DC | PRN

## 2020-02-04 MED ORDER — VANCOMYCIN HCL IN DEXTROSE 1-5 GM/200ML-% IV SOLN
1000.0000 mg | Freq: Once | INTRAVENOUS | Status: DC
  Filled 2020-02-04 (×2): qty 200

## 2020-02-04 MED ORDER — IPRATROPIUM-ALBUTEROL 0.5-2.5 (3) MG/3ML IN SOLN
3.0000 mL | Freq: Four times a day (QID) | RESPIRATORY_TRACT | Status: DC
  Administered 2020-02-04 – 2020-02-07 (×13): 3 mL via RESPIRATORY_TRACT
  Filled 2020-02-04 (×14): qty 3

## 2020-02-04 MED ORDER — HYDROMORPHONE HCL 1 MG/ML IJ SOLN: 1.0000 mg | Freq: Once | INTRAMUSCULAR | Status: DC | PRN

## 2020-02-04 MED ORDER — DIPHENHYDRAMINE HCL 50 MG/ML IJ SOLN: 50.0000 mg | Freq: Once | INTRAMUSCULAR | Status: DC | PRN

## 2020-02-04 MED ORDER — HEPARIN SODIUM (PORCINE) 10000 UNIT/ML IJ SOLN
INTRAMUSCULAR | Status: AC
  Filled 2020-02-04: qty 1

## 2020-02-04 MED ORDER — ONDANSETRON HCL 4 MG/2ML IJ SOLN: 4.0000 mg | Freq: Four times a day (QID) | INTRAMUSCULAR | Status: DC | PRN

## 2020-02-04 MED ORDER — METHYLPREDNISOLONE SODIUM SUCC 125 MG IJ SOLR: 125.0000 mg | Freq: Once | INTRAMUSCULAR | Status: DC | PRN

## 2020-02-04 MED ORDER — SODIUM CHLORIDE 0.9% IV SOLUTION: Freq: Once | INTRAVENOUS | Status: AC

## 2020-02-04 SURGICAL SUPPLY — 3 items
CATH CANNON HEMO 15FR 19 (HEMODIALYSIS SUPPLIES) ×3 IMPLANT
PACK ANGIOGRAPHY (CUSTOM PROCEDURE TRAY) ×3 IMPLANT
SUT MNCRL AB 4-0 PS2 18 (SUTURE) ×3 IMPLANT

## 2020-02-04 NOTE — Progress Notes (Signed)
PT Cancellation Note  Patient Details Name: Blake [name] MRN: [id number] DOB: 02/27/1923   Cancelled Treatment:    Reason Eval/Treat Not Completed: Medical issues which prohibited therapy. Patient to have permanent catheter placed for dialysis and then dialysis, but according to his RN, his procedure has been pushed back to later today. Dialysis may now take place tomorrow. Will continue to follow and see patient as appropriate.  Ashika Apuzzo, PT, GCS 02/04/20,2:09 PM

## 2020-02-04 NOTE — Care Management Important Message (Signed)
Important Message  Patient Details  Name: Blake [name] MRN: [id number] Date of Birth: [birth year]   Medicare Important Message Given:  Yes     [name] 02/04/2020, 11:31 AM

## 2020-02-04 NOTE — Progress Notes (Signed)
SLP Cancellation Note  Patient Details Name: Blake [name] MRN: [id number] DOB: Mar 16, 1923   Cancelled treatment:       Reason Eval/Treat Not Completed: Medical issues which prohibited therapy   Pt currently NPO for permcath procedure. ST will follow once pt returns to PO intake.   Taysean Wager B. Rutherford Nail M.S., CCC-SLP, [place] Office [phone]    Stormy Fabian 02/04/2020, 8:32 AM

## 2020-02-04 NOTE — Progress Notes (Signed)
Palliative: Mr. Crite is resting quietly in bed.  He appears acutely/chronically ill and quite frail.  He is sleeping easily, but wakes when I touch his arm and call his name.  He is able to tell me his name, although I have some difficulty in understanding his speech.  I believe he is able to make his basic needs known.  There is no family at bedside at this time.    Mr. Vandeusen is scheduled for PermCath placement today.  I asked how he is feeling, and he tells me "okay".  I ask how he is doing with hemodialysis and he is nodding affirmatively and states "okay".  He denies questions or concerns at this time.  Conference with attending, bedside nursing staff, transition of care team related to patient condition, needs, goals of care.  Plan:   Continue full scope/full code.  Transition of care team is working with daughter for needs upon discharge.  Anticipate discharge home with home health services.  Pending Manufacturing engineer community palliative program to follow at home with an appointment scheduled 7/30.  25 minutes  Quinn Axe, NP Palliative medicine team Team phone [phone] Greater than 50% of this time was spent counseling and coordinating care related to the above assessment and plan

## 2020-02-04 NOTE — Progress Notes (Signed)
[facility]  ROUNDING NOTE   Subjective:  Pt seen at bedside. Denies any acute complaints Speech is hard to understand but denies any pain No shortness of breath Scheduled for PermCath later today    Objective:  Vital signs in last 24 hours:  Temp:  [97.4 F (36.3 C)-99.3 F (37.4 C)] 97.7 F (36.5 C) (07/26 1335) Pulse Rate:  [63-90] 90 (07/26 1335) Resp:  [20] 20 (07/26 1335) BP: (110-127)/(81-94) 127/83 (07/26 1335) SpO2:  [97 %-100 %] 97 % (07/26 1335) Weight:  [92.2 kg] 92.2 kg (07/26 0500)  Weight change:  Filed Weights   02/01/20 0439 02/02/20 0544 02/04/20 0500  Weight: (!) 93.1 kg (!) 92.5 kg (!) 92.2 kg    Intake/Output: I/O last 3 completed shifts: In: 240 [P.O.:240] Out: 81 [Urine:80; Stool:1]   Intake/Output this shift:  No intake/output data recorded.  Physical Exam: General: NAD, frail, elderly gentleman, laying in the bed  Head: Temporal wasting noted, edentulous  Eyes: Anicteric  Neck: Supple, trachea midline  Lungs:  +rhonchi bilaterally, normal effort, [place] o2  Heart: irregular   Abdomen:  Soft, nontender, bowel sounds present  Extremities: 1-2+ LE edema  Neurologic: Awake, alert, will answer yes/no questions  Skin: No lesions  Access: No access    Basic Metabolic Panel: Recent Labs  Lab 01/28/20 1520 01/28/20 1520 01/29/20 1030 01/29/20 1030 01/31/20 0541 02/01/20 1048 02/03/20 0652  NA 135  --  138  --  139 137 135  K 3.5  --  3.7  --  3.9 3.3* 4.2  CL 96*  --  97*  --  99 97* 94*  CO2 25  --  28  --  28 33* 27  GLUCOSE 132*  --  170*  --  130* 146* 120*  BUN 66*  --  67*  --  59* 34* 69*  CREATININE 5.88*  --  6.55*  --  6.46* 3.65* 6.97*  CALCIUM 7.7*   < > 7.8*   < > 7.8* 7.8* 7.8*  MG  --   --  2.4  --   --   --   --   PHOS  --   --  5.9*  --   --  2.7  --    < > = values in this interval not displayed.    Liver Function Tests: Recent Labs  Lab 01/29/20 1030 02/01/20 1048  ALBUMIN 2.7* 2.5*   No  results for input(s): LIPASE, AMYLASE in the last 168 hours. No results for input(s): AMMONIA in the last 168 hours.  CBC: Recent Labs  Lab 01/29/20 1030 02/01/20 1048 02/03/20 0652  WBC 5.7 7.9 6.3  HGB 9.5* 9.3* 8.7*  HCT 29.4* 29.4* 26.6*  MCV 88.6 90.2 88.4  PLT 52* 53* 58*    Cardiac Enzymes: No results for input(s): CKTOTAL, CKMB, CKMBINDEX, TROPONINI in the last 168 hours.  BNP: Invalid input(s): POCBNP  CBG: Recent Labs  Lab 02/03/20 1137 02/03/20 1644 02/03/20 2102 02/04/20 0805 02/04/20 1126  GLUCAP 107* 163* 178* 119* 114*    Microbiology: Results for orders placed or performed during the hospital encounter of 01/23/20  Urine culture     Status: Abnormal   Collection Time: 01/23/20  9:40 AM   Specimen: Urine, Random  Result Value Ref Range Status   Specimen Description   Final    URINE, RANDOM Performed at [facility], [address] [place], [place] [address]    Special Requests   Final  NONE Performed at [facility], [place], [place] [address]    Culture 70,000 COLONIES/mL STAPHYLOCOCCUS EPIDERMIDIS (A)  Final   Report Status 01/26/2020 FINAL  Final   Organism ID, Bacteria STAPHYLOCOCCUS EPIDERMIDIS (A)  Final      Susceptibility   Staphylococcus epidermidis - MIC*    CIPROFLOXACIN >=8 RESISTANT Resistant     GENTAMICIN <=0.5 SENSITIVE Sensitive     NITROFURANTOIN <=16 SENSITIVE Sensitive     OXACILLIN >=4 RESISTANT Resistant     TETRACYCLINE 2 SENSITIVE Sensitive     VANCOMYCIN 1 SENSITIVE Sensitive     TRIMETH/SULFA 80 RESISTANT Resistant     CLINDAMYCIN <=0.25 SENSITIVE Sensitive     RIFAMPIN <=0.5 SENSITIVE Sensitive     Inducible Clindamycin NEGATIVE Sensitive     * 70,000 COLONIES/mL STAPHYLOCOCCUS EPIDERMIDIS  SARS Coronavirus 2 by RT PCR (hospital order, performed in [facility] [place] hospital lab) Nasopharyngeal Nasopharyngeal Swab     Status: None   Collection Time: 01/23/20  4:00 PM   Specimen:  Nasopharyngeal Swab  Result Value Ref Range Status   SARS Coronavirus 2 NEGATIVE NEGATIVE Final    Comment: (NOTE) SARS-CoV-2 target nucleic acids are NOT DETECTED.  The SARS-CoV-2 RNA is generally detectable in upper and lower respiratory specimens during the acute phase of infection. The lowest concentration of SARS-CoV-2 viral copies this assay can detect is 250 copies / mL. A negative result does not preclude SARS-CoV-2 infection and should not be used as the sole basis for treatment or other patient management decisions.  A negative result may occur with improper specimen collection / handling, submission of specimen other than nasopharyngeal swab, presence of viral mutation(s) within the areas targeted by this assay, and inadequate number of viral copies (<250 copies / mL). A negative result must be combined with clinical observations, patient history, and epidemiological information.  Fact Sheet for Patients:   StrictlyIdeas.no  Fact Sheet for Healthcare Providers: BankingDealers.co.za  This test is not yet approved or  cleared by the [place] FDA and has been authorized for detection and/or diagnosis of SARS-CoV-2 by FDA under an Emergency Use Authorization (EUA).  This EUA will remain in effect (meaning this test can be used) for the duration of the COVID-19 declaration under Section 564(b)(1) of the Act, 21 U.S.C. section 360bbb-3(b)(1), unless the authorization is terminated or revoked sooner.  Performed at [facility], [address] [place], [place] [address]   CULTURE, BLOOD (ROUTINE X 2) w Reflex to ID Panel     Status: None   Collection Time: 01/23/20  5:13 PM   Specimen: BLOOD  Result Value Ref Range Status   Specimen Description BLOOD BLOOD LEFT HAND  Final   Special Requests   Final    BOTTLES DRAWN AEROBIC AND ANAEROBIC Blood Culture adequate volume   Culture   Final    NO GROWTH 5  DAYS Performed at [facility], [address] [place], [place] [address]    Report Status 01/28/2020 FINAL  Final  CULTURE, BLOOD (ROUTINE X 2) w Reflex to ID Panel     Status: None   Collection Time: 01/23/20  6:12 PM   Specimen: BLOOD  Result Value Ref Range Status   Specimen Description BLOOD BLOOD LEFT WRIST  Final   Special Requests Blood Culture adequate volume  Final   Culture   Final    NO GROWTH 5 DAYS Performed at [facility], [address] [place], [place] [address]  Report Status 01/28/2020 FINAL  Final  MRSA PCR Screening     Status: Abnormal   Collection Time: 01/25/20  8:17 AM   Specimen: Nasopharyngeal  Result Value Ref Range Status   MRSA by PCR POSITIVE (A) NEGATIVE Final    Comment:        The GeneXpert MRSA Assay (FDA approved for NASAL specimens only), is one component of a comprehensive MRSA colonization surveillance program. It is not intended to diagnose MRSA infection nor to guide or monitor treatment for MRSA infections. RESULT CALLED TO, READ BACK BY AND VERIFIED WITH: MARIA [facility] 01/25/20 1945 SJL Performed at [facility], [address] [place], [place] [address]   Body fluid culture     Status: None   Collection Time: 01/30/20  2:40 PM   Specimen: PATH Cytology Pleural fluid  Result Value Ref Range Status   Specimen Description   Final    PLEURAL Performed at [facility], [address], [place], [place] [address]    Special Requests   Final    NONE Performed at [facility], [address] [place], [place] [address]    Gram Stain   Final    RARE WBC PRESENT, PREDOMINANTLY MONONUCLEAR NO ORGANISMS SEEN    Culture   Final    NO GROWTH 3 DAYS Performed at [facility], [place] [address], [place], [place] [address]    Report Status 02/03/2020 FINAL  Final  Urine Culture     Status: Abnormal   Collection Time: 01/31/20 10:30 AM   Specimen: Urine, Random  Result Value  Ref Range Status   Specimen Description   Final    URINE, RANDOM Performed at [facility], [address] [place], [place] [address]    Special Requests   Final    Normal Performed at [facility], [address] [place], [place] [address]    Culture MULTIPLE SPECIES PRESENT, SUGGEST RECOLLECTION (A)  Final   Report Status 02/01/2020 FINAL  Final  Body fluid culture     Status: None (Preliminary result)   Collection Time: 02/01/20  2:00 PM   Specimen: PATH Cytology Pleural fluid  Result Value Ref Range Status   Specimen Description   Final    PLEURAL Performed at [facility], [address], [place], [place] [address]    Special Requests   Final    NONE Performed at [facility], [place], [place] [address]    Gram Stain NO WBC SEEN NO ORGANISMS SEEN   Final   Culture   Final    NO GROWTH 3 DAYS Performed at [facility], [place] [address] [place], [place] [address]    Report Status PENDING  Incomplete    Coagulation Studies: No results for input(s): LABPROT, INR in the last 72 hours.  Urinalysis: No results for input(s): COLORURINE, LABSPEC, PHURINE, GLUCOSEU, HGBUR, BILIRUBINUR, KETONESUR, PROTEINUR, UROBILINOGEN, NITRITE, LEUKOCYTESUR in the last 72 hours.  Invalid input(s): APPERANCEUR    Imaging: No results found.   Medications:    sodium chloride 250 mL (02/04/20 0116)   piperacillin-tazobactam (ZOSYN)  IV 2.25 g (02/04/20 1124)   vancomycin     vancomycin      ferrous sulfate  325 mg Oral Q breakfast   And   vitamin C  250 mg Oral Q breakfast   azelastine  1 spray Each Nare BID   Chlorhexidine Gluconate Cloth  6 each Topical Q0600  epoetin (EPOGEN/PROCRIT) injection  10,000 Units Intravenous Q T,Th,Sa-HD   feeding supplement (ENSURE ENLIVE)  237 mL Oral BID BM   finasteride  5 mg Oral Daily   insulin aspart  0-5 Units Subcutaneous QHS   insulin aspart  0-9 Units  Subcutaneous TID WC   ipratropium-albuterol  3 mL Nebulization Q6H   mouth rinse  15 mL Mouth Rinse BID   metoprolol tartrate  50 mg Oral BID   multivitamin  1 tablet Oral QHS   pantoprazole  40 mg Oral Daily   rosuvastatin  5 mg Oral QHS   sodium bicarbonate  650 mg Oral BID   tamsulosin  0.4 mg Oral Daily   sodium chloride, acetaminophen, ammonium lactate, metoprolol tartrate, ondansetron (ZOFRAN) IV, vancomycin  Assessment/ Plan:  Blake [name] is a 86 y.o. black male with acute renal failure on hemodialysis, hypertension, hyperlipidemia, diabetes mellitus type 2, GERD, obstructive sleep apnea, urothelial carcinoma of the bladder status post bilateral nephrostomy tube placement, permanent pacemaker placement secondary to sick sinus syndrome, SVT, who is admitted to [facility] on 01/23/2020 for Weakness [R53.1] SOB (shortness of breath) [R06.02] Acute renal failure superimposed on stage 4 chronic kidney disease ([place]) [N17.9, N18.4] Acute renal failure superimposed on chronic kidney disease, unspecified CKD stage, unspecified acute renal failure type ([place]) [N17.9, N18.9] Patient initiated on hemodialysis on 7/17 for uremic symptoms.   1. End Stage Renal Disease: PermCath placement today.  If late in the day, we will plan on dialysis tomorrow. Family requesting placement at [facility].  Start discharge planning Patient will need to be able to sit in the chair for the entire duration of dialysis treatment prior to discharge.   2. Anemia of chronic kidney disease.   Lab Results  Component Value Date   HGB 8.7 (L) 02/03/2020  Maintain Epogen 10,000 IV with dialysis treatments.  3. Hypertension -Continue metoprolol.  4. Bilateral pleural effusions: status post Right thoracentesis on 7/21 with 2.4 liters removed.  Left thoracentesis performed 02/01/2020.   LOS: [place] 7/26/20213:17 PM

## 2020-02-04 NOTE — Progress Notes (Signed)
Patient ID: Blake [name], male   DOB: Nov 30, 1922, 86 y.o.   MRN: [id number] [facility] PROGRESS NOTE  Blake [name] NID:[id number] DOB: 11-19-1922 DOA: 01/23/2020 PCP: Adin Hector, MD  HPI/Subjective: Patient answers a few questions.  Has a little wheeze this morning when I saw him.  Having bleeding from the permacath site this afternoon.  With his platelets on the lower side I will go ahead and give a transfusion of platelets.  Objective: Vitals:   02/04/20 1645 02/04/20 1705  BP: (!) 112/87 121/65  Pulse: (!) 123 (!) 124  Resp: 20 20  Temp:  98.3 F (36.8 C)  SpO2: 95% 94%    Intake/Output Summary (Last 24 hours) at 02/04/2020 1803 Last data filed at 02/03/2020 2115 Gross per 24 hour  Intake 0 ml  Output --  Net 0 ml   Filed Weights   02/01/20 0439 02/02/20 0544 02/04/20 0500  Weight: (!) 93.1 kg (!) 92.5 kg (!) 92.2 kg    ROS: Review of Systems  Respiratory: Negative for shortness of breath.   Cardiovascular: Negative for chest pain.  Gastrointestinal: Negative for abdominal pain.   Exam: Physical Exam HENT:     Nose: No mucosal edema.     Mouth/Throat:     Pharynx: No oropharyngeal exudate.  Eyes:     General: Lids are normal.     Conjunctiva/sclera: Conjunctivae normal.     Pupils: Pupils are equal, round, and reactive to light.  Cardiovascular:     Rate and Rhythm: Regular rhythm. Tachycardia present.     Heart sounds: Normal heart sounds, S1 normal and S2 normal.  Pulmonary:     Breath sounds: Examination of the right-middle field reveals wheezing. Examination of the left-middle field reveals wheezing. Examination of the right-lower field reveals decreased breath sounds and rhonchi. Examination of the left-lower field reveals decreased breath sounds and rhonchi. Decreased breath sounds, wheezing and rhonchi present. No rales.  Abdominal:     General: Bowel sounds are normal.     Palpations: Abdomen is soft.     Tenderness: There is no abdominal  tenderness.  Musculoskeletal:     Right ankle: Swelling present.     Left ankle: Swelling present.  Skin:    General: Skin is warm.     Findings: No rash.  Neurological:     Mental Status: He is alert.       Data Reviewed: Basic Metabolic Panel: Recent Labs  Lab 01/29/20 1030 01/31/20 0541 02/01/20 1048 02/03/20 0652  NA 138 139 137 135  K 3.7 3.9 3.3* 4.2  CL 97* 99 97* 94*  CO2 28 28 33* 27  GLUCOSE 170* 130* 146* 120*  BUN 67* 59* 34* 69*  CREATININE 6.55* 6.46* 3.65* 6.97*  CALCIUM 7.8* 7.8* 7.8* 7.8*  MG 2.4  --   --   --   PHOS 5.9*  --  2.7  --    Liver Function Tests: Recent Labs  Lab 01/29/20 1030 02/01/20 1048  ALBUMIN 2.7* 2.5*   CBC: Recent Labs  Lab 01/29/20 1030 02/01/20 1048 02/03/20 0652  WBC 5.7 7.9 6.3  HGB 9.5* 9.3* 8.7*  HCT 29.4* 29.4* 26.6*  MCV 88.6 90.2 88.4  PLT 52* 53* 58*   BNP (last 3 results) Recent Labs    09/24/19 1530 01/23/20 0948 01/29/20 1046  BNP 982.0* 2,109.0* 4,446.4*     CBG: Recent Labs  Lab 02/03/20 1644 02/03/20 2102 02/04/20 0805 02/04/20 1126 02/04/20 1541  GLUCAP  163* 178* 119* 114* 110*    Recent Results (from the past 240 hour(s))  Body fluid culture     Status: None   Collection Time: 01/30/20  2:40 PM   Specimen: PATH Cytology Pleural fluid  Result Value Ref Range Status   Specimen Description   Final    PLEURAL Performed at [facility], [address], [place], [place] [address]    Special Requests   Final    NONE Performed at [facility], [address] [place], [place] [address]    Gram Stain   Final    RARE WBC PRESENT, PREDOMINANTLY MONONUCLEAR NO ORGANISMS SEEN    Culture   Final    NO GROWTH 3 DAYS Performed at [facility], [place] [address] [place], [place] [address]    Report Status 02/03/2020 FINAL  Final  Urine Culture     Status: Abnormal   Collection Time: 01/31/20 10:30 AM   Specimen: Urine, Random  Result Value Ref Range Status    Specimen Description   Final    URINE, RANDOM Performed at [facility], [address] [place], [place] [address]    Special Requests   Final    Normal Performed at [facility], [address] [place], [place] [address]    Culture MULTIPLE SPECIES PRESENT, SUGGEST RECOLLECTION (A)  Final   Report Status 02/01/2020 FINAL  Final  Body fluid culture     Status: None (Preliminary result)   Collection Time: 02/01/20  2:00 PM   Specimen: PATH Cytology Pleural fluid  Result Value Ref Range Status   Specimen Description   Final    PLEURAL Performed at [facility], [address], [place], [place] [address]    Special Requests   Final    NONE Performed at [facility], [place], [place] [address]    Gram Stain NO WBC SEEN NO ORGANISMS SEEN   Final   Culture   Final    NO GROWTH 3 DAYS Performed at [facility], [place] [address] [place], [place] [address]    Report Status PENDING  Incomplete     Studies: PERIPHERAL VASCULAR CATHETERIZATION  Result Date: 02/04/2020 See op note   Scheduled Meds: . ferrous sulfate  325 mg Oral Q breakfast   And  . vitamin C  250 mg Oral Q breakfast  . azelastine  1 spray Each Nare BID  . Chlorhexidine Gluconate Cloth  6 each Topical Q0600  . epoetin (EPOGEN/PROCRIT) injection  10,000 Units Intravenous Q T,Th,Sa-HD  . feeding supplement (ENSURE ENLIVE)  237 mL Oral BID BM  . finasteride  5 mg Oral Daily  . insulin aspart  0-5 Units Subcutaneous QHS  . insulin aspart  0-9 Units Subcutaneous TID WC  . ipratropium-albuterol  3 mL Nebulization Q6H  . mouth rinse  15 mL Mouth Rinse BID  . metoprolol tartrate  50 mg Oral BID  . multivitamin  1 tablet Oral QHS  . pantoprazole  40 mg Oral Daily  . rosuvastatin  5 mg Oral QHS  . sodium bicarbonate  650 mg Oral BID  . tamsulosin  0.4 mg Oral Daily   Continuous Infusions: . sodium chloride 30 mL (02/04/20 1716)  .  piperacillin-tazobactam (ZOSYN)  IV 2.25 g (02/04/20 1716)  . vancomycin    . vancomycin      Assessment/Plan:  1. End-stage renal disease.  Vascular surgery put in a PermCath today.  Patient will have to tolerate dialysis sitting up and have an outpatient dialysis slot before any disposition. 2. Bleeding from permacath site and relative thrombocytopenia with platelet count of 58.  Since the patient is bleeding from the permacath site I will give a unit of platelets.  Benefits and risk of transfusion explained.  Platelets on the blood cells that help out with clotting. 3. Bilateral pleural effusions.  Right thoracentesis removed 2400 mL on 7/21 and left thoracentesis removed 750 mL on 7/23. 4. Paroxysmal atrial fibrillation and SVT on metoprolol twice a day.  Metoprolol just given. 5. Nonsustained ventricular tachycardia.  Seen by cardiology. 6. Acute hypoxic respiratory failure on oxygen supplementation currently on 2 L. 7. Acute on chronic diastolic congestive heart failure.  Dialysis to remove fluid.  We will give Lasix after platelet transfusion today and hopefully start torsemide daily. 8. Thrombocytopenia with platelet count of 58.  With bleeding from PermCath site I will give platelet transfusion. 9. Anemia of chronic disease 10. Chronic decubitus ulcer present on admission stage II on sacrum 11. Weakness.  Continued physical therapy evaluation  Code Status:     Code Status Orders  (From admission, onward)         Start     Ordered   01/23/20 1517  Full code  Continuous        01/23/20 1516        Code Status History    Date Active Date Inactive Code Status Order ID Comments User Context   11/04/2019 0118 11/05/2019 2145 Full Code [id number]  Neena Rhymes, MD ED   11/04/2019 0054 11/04/2019 0118 DNR [id number]  Neena Rhymes, MD ED   09/24/2019 2132 09/28/2019 2309 Full Code [id number]  Orene Desanctis, DO ED   01/03/2019 0142 01/09/2019 2238 Full Code [id number]  Mansy, Arvella Merles,  MD ED   12/25/2018 1233 01/02/2019 1950 Full Code [id number]  Dustin Flock, MD Inpatient   12/25/2018 0645 12/25/2018 1233 Full Code [id number]  Paulette Blanch, MD ED   11/21/2018 2217 11/29/2018 2052 Full Code [id number]  Mayer Camel, NP ED   11/05/2018 1755 11/09/2018 1728 Full Code [id number]  Mayo, Pete Pelt, MD Inpatient   09/10/2017 1430 09/13/2017 2115 Full Code [id number]  Idelle Crouch, MD Inpatient   06/10/2017 2304 06/12/2017 1908 Full Code [id number]  Lance Coon, MD Inpatient   05/17/2017 0829 05/19/2017 1525 Full Code [id number]  Hillary Bow, MD ED   03/31/2017 1334 04/03/2017 1611 Full Code [id number]  Nicholes Mango, MD Inpatient   10/21/2016 0155 10/25/2016 2145 Full Code [id number]  [place]yer, [place], DO Inpatient   10/09/2016 2342 10/13/2016 2009 Full Code [id number]  Vaughan Basta, MD Inpatient   04/14/2015 1432 04/15/2015 1739 Full Code [id number]  Isaias Cowman, MD Inpatient   04/09/2015 0022 04/14/2015 1432 Full Code [id number]  Hower, Aaron Mose, MD ED   Advance Care Planning Activity     Family Communication: Spoke with Dr. Primus Bravo on the phone Disposition Plan: Status is: Inpatient   Dispo: The patient is from: Home              Anticipated d/c is to: Home              Anticipated d/c date is: Will need outpatient dialysis slot and be able to tolerate dialysis sitting up.  Bleeding from permacath site will have to stop prior to disposition              Patient currently will receive  a unit of platelets tonight for bleeding from dialysis site and relatively low platelets.  Consultants:  Vascular surgery  Procedures:  PermCath  Thoracentesis left  Thoracentesis right  Antibiotics:  Vancomycin  Zosyn  Time spent: 28 minutes  [place]

## 2020-02-04 NOTE — TOC Progression Note (Signed)
Transition of Care [facility]) - Progression Note    Patient Details  Name: ROBBY BULKLEY MRN: [id number] Date of Birth: [birth year]  Transition of Care [facility]) CM/SW Contact  Beverly Sessions, RN Phone Number: 02/04/2020, 12:42 PM  Clinical Narrative:    Plan for perm cath placement today Discussed discharge disposition with daughter Dr Primus Bravo   Daughter states that if PT recommends SNF, she intends to take patient home with resumption of home health services through [facility]  Daughter states they have a ramp at home that is in usable condition, however it is not ADA approved.   Daughter states that she has spoke with [place] transportation, however they are requesting that she cut down her crepe myrtle trees.   Emailed daughter additional resources [place] website for ramp resource: HOME ([email or web address])  [facility] is a private pay medical transport service : [facility]: Agency for [place] need for hospital bed at discharge.  Daughter in agreement.  She states that they have a hoyer lift in the home, but we are not sure if it would be compatible for a hospital bed. Heads up referral made to [facility] with [employer] for equipment needs      Expected Discharge Plan: [place] Barriers to Discharge: Continued Medical Work up  Expected Discharge Plan and Services Expected Discharge Plan: [place] arrangements for the past 2 months: Single Family Home                                       Social Determinants of Health (SDOH) Interventions    Readmission Risk Interventions Readmission Risk Prevention Plan 01/25/2020 09/25/2019 12/30/2018  Transportation Screening Complete Complete Complete  PCP or Specialist Appt within 3-5 Days - Complete -  HRI or [place] - Complete -  Social Work Consult for [place] Planning/Counseling - Complete -  Palliative Care Screening - Not Applicable -   Medication Review Press photographer) Complete Referral to Pharmacy Complete  HRI or [place] Complete - [place] Patient Refused - -  Some recent data might be hidden

## 2020-02-04 NOTE — Op Note (Signed)
OPERATIVE NOTE    PRE-OPERATIVE DIAGNOSIS: 1. ESRD   POST-OPERATIVE DIAGNOSIS: same as above  PROCEDURE: 1. Ultrasound guidance for vascular access to the right internal jugular vein 2. Fluoroscopic guidance for placement of catheter 3. Placement of a 19 cm tip to cuff tunneled hemodialysis catheter via the right internal jugular vein  SURGEON: Leotis Pain, MD  ANESTHESIA:  Local   ESTIMATED BLOOD LOSS: 20 cc  FLUORO TIME: less than one minute  CONTRAST: none  FINDING(S): 1.  Patent right internal jugular vein  SPECIMEN(S):  None  INDICATIONS:   Blake [name] is a 86 y.o.male who presents with renal failure.  The patient needs long term dialysis access for their ESRD, and a Permcath is necessary.  Risks and benefits are discussed and informed consent is obtained.    DESCRIPTION: After obtaining full informed written consent, the patient was brought back to the vascular suited. The patient's right neck and chest were sterilely prepped and draped in a sterile surgical field was created. Moderate conscious sedation was administered during a face to face encounter with the patient throughout the procedure with my supervision of the RN administering medicines and monitoring the patient's vital signs, pulse oximetry, telemetry and mental status throughout from the start of the procedure until the patient was taken to the recovery room.  The right internal jugular vein was visualized with ultrasound and found to be patent. It was then accessed under direct ultrasound guidance and a permanent image was recorded. A wire was placed. After skin nick and dilatation, the peel-away sheath was placed over the wire. I then turned my attention to an area under the clavicle. Approximately 1-2 fingerbreadths below the clavicle a small counterincision was created and tunneled from the subclavicular incision to the access site. Using fluoroscopic guidance, a 19 centimeter tip to cuff tunneled  hemodialysis catheter was selected, and tunneled from the subclavicular incision to the access site. It was then placed through the peel-away sheath and the peel-away sheath was removed. Using fluoroscopic guidance the catheter tips were parked in the right atrium. The appropriate distal connectors were placed. It withdrew blood well and flushed easily with heparinized saline and a concentrated heparin solution was then placed. It was secured to the chest wall with 2 Prolene sutures. The access incision was closed single 4-0 Monocryl. A 4-0 Monocryl pursestring suture was placed around the exit site. Sterile dressings were placed. The patient tolerated the procedure well and was taken to the recovery room in stable condition.  COMPLICATIONS: None  CONDITION: Stable  Leotis Pain, MD 02/04/2020 4:35 PM   This note was created with Dragon Medical transcription system. Any errors in dictation are purely unintentional.

## 2020-02-05 ENCOUNTER — Encounter: Payer: Self-pay | Admitting: Vascular Surgery

## 2020-02-05 ENCOUNTER — Ambulatory Visit: Admission: RE | Admit: 2020-02-05 | Payer: Medicare PPO | Source: Ambulatory Visit

## 2020-02-05 DIAGNOSIS — J90 Pleural effusion, not elsewhere classified: Secondary | ICD-10-CM | POA: Diagnosis not present

## 2020-02-05 DIAGNOSIS — I480 Paroxysmal atrial fibrillation: Secondary | ICD-10-CM | POA: Diagnosis not present

## 2020-02-05 DIAGNOSIS — N186 End stage renal disease: Secondary | ICD-10-CM | POA: Diagnosis not present

## 2020-02-05 DIAGNOSIS — J9601 Acute respiratory failure with hypoxia: Secondary | ICD-10-CM | POA: Diagnosis not present

## 2020-02-05 LAB — BODY FLUID CULTURE
Culture: NO GROWTH
Gram Stain: NONE SEEN

## 2020-02-05 LAB — GLUCOSE, CAPILLARY
Glucose-Capillary: 125 mg/dL — ABNORMAL HIGH (ref 70–99)
Glucose-Capillary: 166 mg/dL — ABNORMAL HIGH (ref 70–99)
Glucose-Capillary: 170 mg/dL — ABNORMAL HIGH (ref 70–99)
Glucose-Capillary: 169 mg/dL — ABNORMAL HIGH (ref 70–99)

## 2020-02-05 LAB — BASIC METABOLIC PANEL
Anion gap: 13 (ref 5–15)
BUN: 85 mg/dL — ABNORMAL HIGH (ref 8–23)
CO2: 29 mmol/L (ref 22–32)
Calcium: 7.8 mg/dL — ABNORMAL LOW (ref 8.9–10.3)
Chloride: 96 mmol/L — ABNORMAL LOW (ref 98–111)
Creatinine, Ser: 8.32 mg/dL — ABNORMAL HIGH (ref 0.61–1.24)
GFR calc Af Amer: 6 mL/min — ABNORMAL LOW (ref >60)
GFR calc non Af Amer: 5 mL/min — ABNORMAL LOW (ref >60)
Glucose, Bld: 163 mg/dL — ABNORMAL HIGH (ref 70–99)
Potassium: 4.4 mmol/L (ref 3.5–5.1)
Sodium: 138 mmol/L (ref 135–145)

## 2020-02-05 LAB — PREPARE PLATELET PHERESIS: Unit division: 0

## 2020-02-05 LAB — BPAM PLATELET PHERESIS
Blood Product Expiration Date: 202107292359
ISSUE DATE / TIME: 202107262113
Unit Type and Rh: 5100

## 2020-02-05 LAB — CBC
HCT: 26.9 % — ABNORMAL LOW (ref 39.0–52.0)
Hemoglobin: 8.8 g/dL — ABNORMAL LOW (ref 13.0–17.0)
MCH: 28.9 pg (ref 26.0–34.0)
MCHC: 32.7 g/dL (ref 30.0–36.0)
MCV: 88.5 fL (ref 80.0–100.0)
Platelets: 106 10*3/uL — ABNORMAL LOW (ref 150–400)
RBC: 3.04 MIL/uL — ABNORMAL LOW (ref 4.22–5.81)
RDW: 17.2 % — ABNORMAL HIGH (ref 11.5–15.5)
WBC: 6.3 10*3/uL (ref 4.0–10.5)
nRBC: 0 % (ref 0.0–0.2)

## 2020-02-05 LAB — CYTOLOGY - NON PAP

## 2020-02-05 MED ORDER — AMOXICILLIN-POT CLAVULANATE 500-125 MG PO TABS
1.0000 | ORAL_TABLET | Freq: Every evening | ORAL | Status: AC
  Administered 2020-02-05 – 2020-02-06 (×2): 500 mg via ORAL
  Filled 2020-02-05 (×2): qty 1

## 2020-02-05 MED ORDER — VANCOMYCIN HCL IN DEXTROSE 1-5 GM/200ML-% IV SOLN
1000.0000 mg | Freq: Once | INTRAVENOUS | Status: AC
  Administered 2020-02-05: 1000 mg via INTRAVENOUS
  Filled 2020-02-05: qty 200

## 2020-02-05 NOTE — Progress Notes (Signed)
Hd started  

## 2020-02-05 NOTE — Progress Notes (Signed)
PT Cancellation Note  Patient Details Name: Blake [name] MRN: [id number] DOB: 1922/10/13   Cancelled Treatment:    Reason Eval/Treat Not Completed:  (Chart reviewed for continued attempts at patient evaluation.  Currently out of the room in dialysis.  Patient has been consistently unable or unavailable to participate with therapy for multiple attempts now.  Will re-attempt one additional session next date (on a non-dialysis date); if remains unable, will discontinue initial order.)   Jeany [place]ville H. Owens Shark, PT, DPT, NCS 02/05/20, 10:37 AM [phone]

## 2020-02-05 NOTE — Progress Notes (Signed)
[facility]  ROUNDING NOTE   Subjective:   Patient seen during dialysis Tolerating well    HEMODIALYSIS FLOWSHEET:  Blood Flow Rate (mL/min): 300 mL/min Arterial Pressure (mmHg): -150 mmHg Venous Pressure (mmHg): 120 mmHg Transmembrane Pressure (mmHg): 50 mmHg Ultrafiltration Rate (mL/min): 280 mL/min Dialysate Flow Rate (mL/min): 600 ml/min Conductivity: Machine : 14 Conductivity: Machine : 14 Dialysis Fluid Bolus: Normal Saline Bolus Amount (mL): 250 mL  Dialyzing in bed today  Objective:  Vital signs in last 24 hours:  Temp:  [97.4 F (36.3 C)-98.7 F (37.1 C)] 97.7 F (36.5 C) (07/27 1340) Pulse Rate:  [62-124] 100 (07/27 1340) Resp:  [16-33] 20 (07/27 1340) BP: (94-158)/(62-95) 158/94 (07/27 1340) SpO2:  [94 %-100 %] 96 % (07/27 1340)  Weight change:  Filed Weights   02/01/20 0439 02/02/20 0544 02/04/20 0500  Weight: (!) 93.1 kg (!) 92.5 kg (!) 92.2 kg    Intake/Output: I/O last 3 completed shifts: In: 901.3 [I.V.:195.3; Blood:306; IV Piggyback:400] Out: 160 [Urine:160]   Intake/Output this shift:  Total I/O In: 300 [P.O.:300] Out: 500 [Other:500]  Physical Exam: General: NAD, frail, elderly gentleman, laying in the bed  Head: Temporal wasting noted, edentulous  Eyes: Anicteric  Lungs:  +rhonchi bilaterally, normal effort, [place] o2  Heart: irregular   Abdomen:  Soft, nontender,    Extremities: 1-2+ LE edema  Neurologic:  Resting quietly  Access:  Right IJ PermCath.  Placed July 25.    Basic Metabolic Panel: Recent Labs  Lab 01/31/20 0541 01/31/20 0541 02/01/20 1048 02/03/20 0652 02/05/20 0427  NA 139  --  137 135 138  K 3.9  --  3.3* 4.2 4.4  CL 99  --  97* 94* 96*  CO2 28  --  33* 27 29  GLUCOSE 130*  --  146* 120* 163*  BUN 59*  --  34* 69* 85*  CREATININE 6.46*  --  3.65* 6.97* 8.32*  CALCIUM 7.8*   < > 7.8* 7.8* 7.8*  PHOS  --   --  2.7  --   --    < > = values in this interval not displayed.    Liver Function  Tests: Recent Labs  Lab 02/01/20 1048  ALBUMIN 2.5*   No results for input(s): LIPASE, AMYLASE in the last 168 hours. No results for input(s): AMMONIA in the last 168 hours.  CBC: Recent Labs  Lab 02/01/20 1048 02/03/20 0652 02/05/20 0427  WBC 7.9 6.3 6.3  HGB 9.3* 8.7* 8.8*  HCT 29.4* 26.6* 26.9*  MCV 90.2 88.4 88.5  PLT 53* 58* 106*    Cardiac Enzymes: No results for input(s): CKTOTAL, CKMB, CKMBINDEX, TROPONINI in the last 168 hours.  BNP: Invalid input(s): POCBNP  CBG: Recent Labs  Lab 02/04/20 1541 02/04/20 1814 02/04/20 2125 02/05/20 0757 02/05/20 1338  GLUCAP 110* 117* 193* 125* 166*    Microbiology: Results for orders placed or performed during the hospital encounter of 01/23/20  Urine culture     Status: Abnormal   Collection Time: 01/23/20  9:40 AM   Specimen: Urine, Random  Result Value Ref Range Status   Specimen Description   Final    URINE, RANDOM Performed at [facility], [address], [place], [place] [address]    Special Requests   Final    NONE Performed at [facility], [address], [place], [place] [address]    Culture 70,000 COLONIES/mL STAPHYLOCOCCUS EPIDERMIDIS (A)  Final   Report Status 01/26/2020 FINAL  Final  Organism ID, Bacteria STAPHYLOCOCCUS EPIDERMIDIS (A)  Final      Susceptibility   Staphylococcus epidermidis - MIC*    CIPROFLOXACIN >=8 RESISTANT Resistant     GENTAMICIN <=0.5 SENSITIVE Sensitive     NITROFURANTOIN <=16 SENSITIVE Sensitive     OXACILLIN >=4 RESISTANT Resistant     TETRACYCLINE 2 SENSITIVE Sensitive     VANCOMYCIN 1 SENSITIVE Sensitive     TRIMETH/SULFA 80 RESISTANT Resistant     CLINDAMYCIN <=0.25 SENSITIVE Sensitive     RIFAMPIN <=0.5 SENSITIVE Sensitive     Inducible Clindamycin NEGATIVE Sensitive     * 70,000 COLONIES/mL STAPHYLOCOCCUS EPIDERMIDIS  SARS Coronavirus 2 by RT PCR (hospital order, performed in [place] hospital lab) Nasopharyngeal Nasopharyngeal Swab      Status: None   Collection Time: 01/23/20  4:00 PM   Specimen: Nasopharyngeal Swab  Result Value Ref Range Status   SARS Coronavirus 2 NEGATIVE NEGATIVE Final    Comment: (NOTE) SARS-CoV-2 target nucleic acids are NOT DETECTED.  The SARS-CoV-2 RNA is generally detectable in upper and lower respiratory specimens during the acute phase of infection. The lowest concentration of SARS-CoV-2 viral copies this assay can detect is 250 copies / mL. A negative result does not preclude SARS-CoV-2 infection and should not be used as the sole basis for treatment or other patient management decisions.  A negative result may occur with improper specimen collection / handling, submission of specimen other than nasopharyngeal swab, presence of viral mutation(s) within the areas targeted by this assay, and inadequate number of viral copies (<250 copies / mL). A negative result must be combined with clinical observations, patient history, and epidemiological information.  Fact Sheet for Patients:   StrictlyIdeas.no  Fact Sheet for Healthcare Providers: BankingDealers.co.za  This test is not yet approved or  cleared by the [place] FDA and has been authorized for detection and/or diagnosis of SARS-CoV-2 by FDA under an Emergency Use Authorization (EUA).  This EUA will remain in effect (meaning this test can be used) for the duration of the COVID-19 declaration under Section 564(b)(1) of the Act, 21 U.S.C. section 360bbb-3(b)(1), unless the authorization is terminated or revoked sooner.  Performed at [facility], [address] [place], [place] [address]   CULTURE, BLOOD (ROUTINE X 2) w Reflex to ID Panel     Status: None   Collection Time: 01/23/20  5:13 PM   Specimen: BLOOD  Result Value Ref Range Status   Specimen Description BLOOD BLOOD LEFT HAND  Final   Special Requests   Final    BOTTLES DRAWN AEROBIC AND ANAEROBIC Blood  Culture adequate volume   Culture   Final    NO GROWTH 5 DAYS Performed at [facility], [address] [place], [place] [address]    Report Status 01/28/2020 FINAL  Final  CULTURE, BLOOD (ROUTINE X 2) w Reflex to ID Panel     Status: None   Collection Time: 01/23/20  6:12 PM   Specimen: BLOOD  Result Value Ref Range Status   Specimen Description BLOOD BLOOD LEFT WRIST  Final   Special Requests Blood Culture adequate volume  Final   Culture   Final    NO GROWTH 5 DAYS Performed at [facility], [address], [place],  [address]    Report Status 01/28/2020 FINAL  Final  MRSA PCR Screening     Status: Abnormal   Collection Time: 01/25/20  8:17 AM   Specimen: Nasopharyngeal  Result Value Ref Range  Status   MRSA by PCR POSITIVE (A) NEGATIVE Final    Comment:        The GeneXpert MRSA Assay (FDA approved for NASAL specimens only), is one component of a comprehensive MRSA colonization surveillance program. It is not intended to diagnose MRSA infection nor to guide or monitor treatment for MRSA infections. RESULT CALLED TO, READ BACK BY AND VERIFIED WITH: MARIA [facility] 01/25/20 1945 SJL Performed at [facility], [address] [place], [place] [address]   Body fluid culture     Status: None   Collection Time: 01/30/20  2:40 PM   Specimen: PATH Cytology Pleural fluid  Result Value Ref Range Status   Specimen Description   Final    PLEURAL Performed at [facility], [address] [place], [place] [address]    Special Requests   Final    NONE Performed at [facility], [address] [place], [place] [address]    Gram Stain   Final    RARE WBC PRESENT, PREDOMINANTLY MONONUCLEAR NO ORGANISMS SEEN    Culture   Final    NO GROWTH 3 DAYS Performed at [facility], [place] [address], [place], [place] [address]    Report Status 02/03/2020 FINAL  Final  Urine Culture     Status: Abnormal   Collection Time:  01/31/20 10:30 AM   Specimen: Urine, Random  Result Value Ref Range Status   Specimen Description   Final    URINE, RANDOM Performed at [facility], [address] [place], [place] [address]    Special Requests   Final    Normal Performed at [facility], [address] [place], [place] [address]    Culture MULTIPLE SPECIES PRESENT, SUGGEST RECOLLECTION (A)  Final   Report Status 02/01/2020 FINAL  Final  Body fluid culture     Status: None   Collection Time: 02/01/20  2:00 PM   Specimen: PATH Cytology Pleural fluid  Result Value Ref Range Status   Specimen Description   Final    PLEURAL Performed at [facility], [address] [place], [place] [address]    Special Requests   Final    NONE Performed at [facility], [place], [place] [address]    Gram Stain NO WBC SEEN NO ORGANISMS SEEN   Final   Culture   Final    NO GROWTH 3 DAYS Performed at [facility], [place] [address] [place], [place] [address]    Report Status 02/05/2020 FINAL  Final    Coagulation Studies: No results for input(s): LABPROT, INR in the last 72 hours.  Urinalysis: No results for input(s): COLORURINE, LABSPEC, PHURINE, GLUCOSEU, HGBUR, BILIRUBINUR, KETONESUR, PROTEINUR, UROBILINOGEN, NITRITE, LEUKOCYTESUR in the last 72 hours.  Invalid input(s): APPERANCEUR    Imaging: PERIPHERAL VASCULAR CATHETERIZATION  Result Date: 02/04/2020 See op note    Medications:   . sodium chloride Stopped (02/04/20 1839)  . vancomycin     . amoxicillin-clavulanate  1 tablet Oral QPM  . ferrous sulfate  325 mg Oral Q breakfast   And  . vitamin C  250 mg Oral Q breakfast  . azelastine  1 spray Each Nare BID  . Chlorhexidine Gluconate Cloth  6 each Topical Q0600  . epoetin (EPOGEN/PROCRIT) injection  10,000 Units Intravenous Q T,Th,Sa-HD  . feeding supplement (ENSURE ENLIVE)  237 mL Oral BID BM  . finasteride  5 mg Oral Daily  . insulin  aspart  0-5 Units Subcutaneous QHS  . insulin aspart  0-9 Units Subcutaneous TID WC  . ipratropium-albuterol  3 mL Nebulization Q6H  . mouth rinse  15 mL Mouth Rinse BID  . metoprolol tartrate  50 mg Oral BID  . multivitamin  1 tablet Oral QHS  . pantoprazole  40 mg Oral Daily  . rosuvastatin  5 mg Oral QHS  . sodium bicarbonate  650 mg Oral BID  . tamsulosin  0.4 mg Oral Daily   sodium chloride, acetaminophen, ammonium lactate, HYDROmorphone (DILAUDID) injection, metoprolol tartrate, ondansetron (ZOFRAN) IV, ondansetron (ZOFRAN) IV  Assessment/ Plan:  [name] is a 86 y.o. black male with acute renal failure on hemodialysis, hypertension, hyperlipidemia, diabetes mellitus type 2, GERD, obstructive sleep apnea, urothelial carcinoma of the bladder status post bilateral nephrostomy tube placement, permanent pacemaker placement secondary to sick sinus syndrome, SVT, who is admitted to [facility] on 01/23/2020 for Weakness [R53.1] SOB (shortness of breath) [R06.02] Acute renal failure superimposed on stage 4 chronic kidney disease ([place]) [N17.9, N18.4] Acute renal failure superimposed on chronic kidney disease, unspecified CKD stage, unspecified acute renal failure type ([place]) [N17.9, N18.9] Patient initiated on hemodialysis on 7/17 for uremic symptoms.   1. End Stage Renal Disease: PermCath placed July 26.  Seen during dialysis today.  Tolerating well. Family requesting placement at [facility].  Start discharge planning Patient will need to be able to sit in the chair for the entire duration of dialysis treatment prior to discharge. Plan to dialyze sitting in the chair tomorrow   2. Anemia of chronic kidney disease.   Lab Results  Component Value Date   HGB 8.8 (L) 02/05/2020  Maintain Epogen 10,000 IV with dialysis treatments.  3. Hypertension -Continue metoprolol.  4. Bilateral pleural effusions: status post Right thoracentesis on 7/21 with 2.4 liters removed.   Left thoracentesis performed 02/01/2020.   LOS: [place] 7/27/20213:34 PM

## 2020-02-05 NOTE — Progress Notes (Signed)
This note also relates to the following rows which could not be included: Resp - Cannot attach notes to unvalidated device data BP - Cannot attach notes to unvalidated device data  Hd completed  

## 2020-02-05 NOTE — Consult Note (Signed)
Pharmacy Antibiotic Note  Blake [name] is a 86 y.o. male  admitted on 01/23/2020 with a UTI (previously treated to completion) with a history of CKD and a right nephrostomy tube that progressed during this admission to HD. Dr Leslye Peer has started antibiotic treatment for purulent drainage from his nephrostomy tube (scheduled to be replaced). Pharmacy was consulted for Vancomycin dosing. He received a 1750 mg vancomycin loading dose yesterday and received HD today. His temp HD cath is scheduled to be removed today and a tunneled catheter is scheduled to be placed on Monday 7/26  Plan:  1) vancomycin   vancomycin 1000mg  following HD today  Stopping after this dose for a total of 7 days of therapy    Height: 6' (182.9 cm) Weight: (!) 92.2 kg (203 lb 4.2 oz) IBW/kg (Calculated) : 77.6  Temp (24hrs), Avg:97.9 F (36.6 C), Min:97.4 F (36.3 C), Max:98.7 F (37.1 C)  Recent Labs  Lab 01/31/20 0541 02/01/20 1048 02/03/20 0652 02/05/20 0427  WBC  --  7.9 6.3 6.3  CREATININE 6.46* 3.65* 6.97* 8.32*    Estimated Creatinine Clearance: 5.8 mL/min (A) (by C-G formula based on SCr of 8.32 mg/dL (H)).    Allergies  Allergen Reactions  . Cefepime     seizure  . Diovan [Valsartan] Cough  . Gabapentin Other (See Comments)    Sedation at all doses  . Hydralazine Itching  . Lisinopril Cough  . Pregabalin Other (See Comments)    Sedation at all doses  . Levofloxacin Other (See Comments)    Too many side effects. Nausea, vomiting, upset stomach, increased confusion, etc Other reaction(s): Confusion Nausea, chills Nausea, chills   . Sulfamethoxazole-Trimethoprim Other (See Comments)    Too many side effects. Nausea, vomiting, upset stomach, increased confusion, etc    Antimicrobials this admission: Zosyn 7/22 >>7/27 Augmentin 7/27>7/28 Vancomycin 7/22 >>7/28  Microbiology results: 7/21 WCx(pleural fluid): NG  7/22 UCx: pending 7/23 WCx(pleural fluid): NG 7/14 BCx: NG  final 7/14 UCx Staph Epi 70K (treated w 3 days aztreonam) 7/16 MRSA PCR: positive 7/14 SARS CoV-2: negative  Thank you for allowing pharmacy to be a part of this patient's care.  Lu Duffel, PharmD, BCPS Clinical Pharmacist 02/05/2020 11:38 AM

## 2020-02-05 NOTE — Progress Notes (Signed)
Patient ID: Blake [name], male   DOB: [birth year], 86 y.o.   MRN: [id number] [facility] PROGRESS NOTE  Blake [name] WTU:[id number] DOB: 1922/11/12 DOA: 01/23/2020 PCP: Adin Hector, MD  HPI/Subjective: Patient feeling better.  Breathing better than when he came in.  Admitted with acute on chronic kidney injury.  Patient today asked me how I thought he was doing.  No bleeding from dialysis catheter when I saw him.  Objective: Vitals:   02/05/20 1245 02/05/20 1340  BP:  (!) 158/94  Pulse: 105 100  Resp:  20  Temp: 97.8 F (36.6 C) 97.7 F (36.5 C)  SpO2:  96%    Intake/Output Summary (Last 24 hours) at 02/05/2020 1553 Last data filed at 02/05/2020 1245 Gross per 24 hour  Intake 1201.28 ml  Output 660 ml  Net 541.28 ml   Filed Weights   02/01/20 0439 02/02/20 0544 02/04/20 0500  Weight: (!) 93.1 kg (!) 92.5 kg (!) 92.2 kg    ROS: Review of Systems  Respiratory: Positive for shortness of breath.   Cardiovascular: Negative for chest pain.  Gastrointestinal: Negative for abdominal pain.  Musculoskeletal: Negative for back pain.   Exam: Physical Exam HENT:     Nose: No mucosal edema.     Mouth/Throat:     Pharynx: No oropharyngeal exudate.  Eyes:     General: Lids are normal.     Conjunctiva/sclera: Conjunctivae normal.  Cardiovascular:     Rate and Rhythm: Normal rate. Rhythm irregularly irregular.     Heart sounds: Normal heart sounds, S1 normal and S2 normal.  Pulmonary:     Breath sounds: Examination of the right-lower field reveals decreased breath sounds and rhonchi. Examination of the left-lower field reveals decreased breath sounds and rhonchi. Decreased breath sounds and rhonchi present. No wheezing or rales.  Abdominal:     Palpations: Abdomen is soft.     Tenderness: There is no abdominal tenderness.  Musculoskeletal:     Right ankle: Swelling present.     Left ankle: Swelling present.  Skin:    General: Skin is warm.     Findings: No rash.   Neurological:     Mental Status: He is alert and oriented to person, place, and time.       Data Reviewed: Basic Metabolic Panel: Recent Labs  Lab 01/31/20 0541 02/01/20 1048 02/03/20 0652 02/05/20 0427  NA 139 137 135 138  K 3.9 3.3* 4.2 4.4  CL 99 97* 94* 96*  CO2 28 33* 27 29  GLUCOSE 130* 146* 120* 163*  BUN 59* 34* 69* 85*  CREATININE 6.46* 3.65* 6.97* 8.32*  CALCIUM 7.8* 7.8* 7.8* 7.8*  PHOS  --  2.7  --   --    Liver Function Tests: Recent Labs  Lab 02/01/20 1048  ALBUMIN 2.5*    CBC: Recent Labs  Lab 02/01/20 1048 02/03/20 0652 02/05/20 0427  WBC 7.9 6.3 6.3  HGB 9.3* 8.7* 8.8*  HCT 29.4* 26.6* 26.9*  MCV 90.2 88.4 88.5  PLT 53* 58* 106*   BNP (last 3 results) Recent Labs    09/24/19 1530 01/23/20 0948 01/29/20 1046  BNP 982.0* 2,109.0* 4,446.4*    CBG: Recent Labs  Lab 02/04/20 1541 02/04/20 1814 02/04/20 2125 02/05/20 0757 02/05/20 1338  GLUCAP 110* 117* 193* 125* 166*    Recent Results (from the past 240 hour(s))  Body fluid culture     Status: None   Collection Time: 01/30/20  2:40 PM  Specimen: PATH Cytology Pleural fluid  Result Value Ref Range Status   Specimen Description   Final    PLEURAL Performed at [facility], [address], [place], [place] [address]    Special Requests   Final    NONE Performed at [facility], [address] [place], [place] [address]    Gram Stain   Final    RARE WBC PRESENT, PREDOMINANTLY MONONUCLEAR NO ORGANISMS SEEN    Culture   Final    NO GROWTH 3 DAYS Performed at [facility], [place] [address] [place], [place] [address]    Report Status 02/03/2020 FINAL  Final  Urine Culture     Status: Abnormal   Collection Time: 01/31/20 10:30 AM   Specimen: Urine, Random  Result Value Ref Range Status   Specimen Description   Final    URINE, RANDOM Performed at [facility], [address] [place], [place] [address]    Special Requests   Final     Normal Performed at [facility], [address] [place], [place] [address]    Culture MULTIPLE SPECIES PRESENT, SUGGEST RECOLLECTION (A)  Final   Report Status 02/01/2020 FINAL  Final  Body fluid culture     Status: None   Collection Time: 02/01/20  2:00 PM   Specimen: PATH Cytology Pleural fluid  Result Value Ref Range Status   Specimen Description   Final    PLEURAL Performed at [facility], [address] [place], [place] [address]    Special Requests   Final    NONE Performed at [facility], [place], [place] [address]    Gram Stain NO WBC SEEN NO ORGANISMS SEEN   Final   Culture   Final    NO GROWTH 3 DAYS Performed at [facility], [place] [address] [place],  [address]    Report Status 02/05/2020 FINAL  Final     Studies: PERIPHERAL VASCULAR CATHETERIZATION  Result Date: 02/04/2020 See op note   Scheduled Meds: . amoxicillin-clavulanate  1 tablet Oral QPM  . ferrous sulfate  325 mg Oral Q breakfast   And  . vitamin C  250 mg Oral Q breakfast  . azelastine  1 spray Each Nare BID  . Chlorhexidine Gluconate Cloth  6 each Topical Q0600  . epoetin (EPOGEN/PROCRIT) injection  10,000 Units Intravenous Q T,Th,Sa-HD  . feeding supplement (ENSURE ENLIVE)  237 mL Oral BID BM  . finasteride  5 mg Oral Daily  . insulin aspart  0-5 Units Subcutaneous QHS  . insulin aspart  0-9 Units Subcutaneous TID WC  . ipratropium-albuterol  3 mL Nebulization Q6H  . mouth rinse  15 mL Mouth Rinse BID  . metoprolol tartrate  50 mg Oral BID  . multivitamin  1 tablet Oral QHS  . pantoprazole  40 mg Oral Daily  . rosuvastatin  5 mg Oral QHS  . sodium bicarbonate  650 mg Oral BID  . tamsulosin  0.4 mg Oral Daily   Continuous Infusions: . sodium chloride Stopped (02/04/20 1839)  . vancomycin      Assessment/Plan:  1. End-stage renal disease.  PermCath placed yesterday.  Patient will have to tolerate dialysis sitting up and  have an outpatient dialysis slot before any disposition. 2. Bleeding from PermCath site has subsided.  I did give a platelet transfusion yesterday because of relative thrombocytopenia and bleeding.  Platelet count up today and no bleeding seen today.  3. Bilateral pleural effusions.  On 01/30/2020 a right thoracentesis removed 2400 mL.  On 02/01/2020 a left thoracentesis removed 750 mL. 4. Paroxysmal atrial fibrillation and SVT on metoprolol twice a day. 5. Nonsustained ventricular tachycardia versus aberrant atrial fibrillation.  Seen by cardiology and metoprolol continued. 6. Acute hypoxic respiratory failure on 2 L of oxygen 7. Acute on chronic diastolic congestive heart failure.  Dialysis to remove fluid. 8. Thrombocytopenia.  Platelet count up today over 100 after platelet transfusion yesterday after bleeding with catheter. 9. Chronic decubitus ulcer, present on admission stage II on sacrum 10. Weakness.  Physical therapy reevaluation ordered 11. Drainage from right nephrostomy tube interventional radiology looked at this and did not change the tube but I did start empiric antibiotics of vancomycin and Zosyn.  Switched Zosyn over to Augmentin.  Complete 7-day course pharmacy to put an end date on this.  Pressure Injury 09/25/19 Heel Right Deep Tissue Pressure Injury - Purple or maroon localized area of discolored intact skin or blood-filled blister due to damage of underlying soft tissue from pressure and/or shear. Right heel is purple. Skin intact. Ele (Active)  09/25/19 0200  Location: Heel  Location Orientation: Right  Staging: Deep Tissue Pressure Injury - Purple or maroon localized area of discolored intact skin or blood-filled blister due to damage of underlying soft tissue from pressure and/or shear.  Wound Description (Comments): Right heel is purple. Skin intact. Elevated on 2 pillows.  Present on Admission: Yes     Pressure Injury 01/23/20 Sacrum Medial Stage 2 -  Partial thickness  loss of dermis presenting as a shallow open injury with a red, pink wound bed without slough. st 2 to right cheek (Active)  01/23/20 1647  Location: Sacrum  Location Orientation: Medial  Staging: Stage 2 -  Partial thickness loss of dermis presenting as a shallow open injury with a red, pink wound bed without slough.  Wound Description (Comments): st 2 to right cheek  Present on Admission: Yes       Code Status:     Code Status Orders  (From admission, onward)         Start     Ordered   01/23/20 1517  Full code  Continuous        01/23/20 1516        Code Status History    Date Active Date Inactive Code Status Order ID Comments User Context   11/04/2019 0118 11/05/2019 2145 Full Code [id number]  Neena Rhymes, MD ED   11/04/2019 0054 11/04/2019 0118 DNR [id number]  Neena Rhymes, MD ED   09/24/2019 2132 09/28/2019 2309 Full Code [id number]  Orene Desanctis, DO ED   01/03/2019 0142 01/09/2019 2238 Full Code [id number]  Mansy, Arvella Merles, MD ED   12/25/2018 1233 01/02/2019 1950 Full Code [id number]  Dustin Flock, MD Inpatient   12/25/2018 0645 12/25/2018 1233 Full Code [id number]  Paulette Blanch, MD ED   11/21/2018 2217 11/29/2018 2052 Full Code [id number]  Mayer Camel, NP ED   11/05/2018 1755 11/09/2018 1728 Full Code [id number]  Mayo, Pete Pelt, MD Inpatient   09/10/2017 1430 09/13/2017 2115 Full Code [id number]  Idelle Crouch, MD Inpatient   06/10/2017 2304 06/12/2017 1908 Full Code [id number]  Lance Coon, MD Inpatient   05/17/2017 0829 05/19/2017 1525 Full Code [id number]  Hillary Bow, MD ED   03/31/2017 1334 04/03/2017 1611 Full Code [id number]  Nicholes Mango, MD Inpatient   10/21/2016 0155 10/25/2016 2145 Full Code [id number]  Harvie Bridge, [place] Inpatient   10/09/2016 2342 10/13/2016 2009 Full Code [id number]  Vaughan Basta, MD Inpatient   04/14/2015 1432 04/15/2015 1739 Full Code [id number]  Isaias Cowman, MD Inpatient   04/09/2015 0022 04/14/2015 1432 Full Code [id number]  Hower,  Aaron Mose, MD ED   Advance Care Planning Activity     Family Communication: Spoke with Dr. Primus Bravo on the phone Disposition Plan: Status is: Inpatient   Dispo: The patient is from: Home              Anticipated d/c is to: Home to home health              Anticipated d/c date is: Dependent on outpatient dialysis slot and being able to tolerate dialysis sitting up              Patient currently needs to set up with dialysis and have an outpatient dialysis slot in order to go home.  Consultants:  Nephrology  Vascular surgery  Cardiology  Procedures:  PermCath, right thoracentesis, left thoracentesis  Antibiotics:  Vancomycin  Augmentin  Time spent: 26 minutes  Vera

## 2020-02-05 NOTE — Progress Notes (Signed)
SLP Cancellation Note  Patient Details Name: SHERRELL FARISH MRN: [id number] DOB: 09-02-1922   Cancelled treatment:       Reason Eval/Treat Not Completed: Patient at procedure or test/unavailable  Pt at HD. Will re-attempt on next available diet to try upgraded diet textures.   Phu Record B. Rutherford Nail M.S., CCC-SLP, [place] Office [phone]   Stormy Fabian 02/05/2020, 12:45 PM

## 2020-02-06 DIAGNOSIS — I5033 Acute on chronic diastolic (congestive) heart failure: Secondary | ICD-10-CM | POA: Diagnosis not present

## 2020-02-06 DIAGNOSIS — I480 Paroxysmal atrial fibrillation: Secondary | ICD-10-CM | POA: Diagnosis not present

## 2020-02-06 DIAGNOSIS — N179 Acute kidney failure, unspecified: Secondary | ICD-10-CM | POA: Diagnosis not present

## 2020-02-06 DIAGNOSIS — J9601 Acute respiratory failure with hypoxia: Secondary | ICD-10-CM | POA: Diagnosis not present

## 2020-02-06 LAB — CBC WITH DIFFERENTIAL/PLATELET
Abs Immature Granulocytes: 0.02 10*3/uL (ref 0.00–0.07)
Basophils Absolute: 0 10*3/uL (ref 0.0–0.1)
Basophils Relative: 1 %
Eosinophils Absolute: 0.1 10*3/uL (ref 0.0–0.5)
Eosinophils Relative: 2 %
HCT: 26.3 % — ABNORMAL LOW (ref 39.0–52.0)
Hemoglobin: 8.5 g/dL — ABNORMAL LOW (ref 13.0–17.0)
Immature Granulocytes: 0 %
Lymphocytes Relative: 8 %
Lymphs Abs: 0.4 10*3/uL — ABNORMAL LOW (ref 0.7–4.0)
MCH: 28.5 pg (ref 26.0–34.0)
MCHC: 32.3 g/dL (ref 30.0–36.0)
MCV: 88.3 fL (ref 80.0–100.0)
Monocytes Absolute: 0.6 10*3/uL (ref 0.1–1.0)
Monocytes Relative: 11 %
Neutro Abs: 4.5 10*3/uL (ref 1.7–7.7)
Neutrophils Relative %: 78 %
Platelets: 96 10*3/uL — ABNORMAL LOW (ref 150–400)
RBC: 2.98 MIL/uL — ABNORMAL LOW (ref 4.22–5.81)
RDW: 17.5 % — ABNORMAL HIGH (ref 11.5–15.5)
WBC: 5.7 10*3/uL (ref 4.0–10.5)
nRBC: 0 % (ref 0.0–0.2)

## 2020-02-06 LAB — BASIC METABOLIC PANEL
Anion gap: 13 (ref 5–15)
BUN: 51 mg/dL — ABNORMAL HIGH (ref 8–23)
CO2: 29 mmol/L (ref 22–32)
Calcium: 7.7 mg/dL — ABNORMAL LOW (ref 8.9–10.3)
Chloride: 94 mmol/L — ABNORMAL LOW (ref 98–111)
Creatinine, Ser: 5.77 mg/dL — ABNORMAL HIGH (ref 0.61–1.24)
GFR calc Af Amer: 9 mL/min — ABNORMAL LOW (ref >60)
GFR calc non Af Amer: 8 mL/min — ABNORMAL LOW (ref >60)
Glucose, Bld: 179 mg/dL — ABNORMAL HIGH (ref 70–99)
Potassium: 4.2 mmol/L (ref 3.5–5.1)
Sodium: 136 mmol/L (ref 135–145)

## 2020-02-06 LAB — GLUCOSE, CAPILLARY
Glucose-Capillary: 108 mg/dL — ABNORMAL HIGH (ref 70–99)
Glucose-Capillary: 132 mg/dL — ABNORMAL HIGH (ref 70–99)
Glucose-Capillary: 133 mg/dL — ABNORMAL HIGH (ref 70–99)
Glucose-Capillary: 172 mg/dL — ABNORMAL HIGH (ref 70–99)

## 2020-02-06 LAB — MRSA PCR SCREENING: MRSA by PCR: POSITIVE — AB

## 2020-02-06 MED ORDER — ENSURE ENLIVE PO LIQD
237.0000 mL | Freq: Three times a day (TID) | ORAL | Status: DC
  Administered 2020-02-06 – 2020-02-11 (×12): 237 mL via ORAL

## 2020-02-06 MED ORDER — MUPIROCIN 2 % EX OINT
1.0000 "application " | TOPICAL_OINTMENT | Freq: Two times a day (BID) | CUTANEOUS | Status: AC
  Administered 2020-02-06 – 2020-02-10 (×9): 1 via NASAL
  Filled 2020-02-06: qty 22

## 2020-02-06 NOTE — Progress Notes (Signed)
[facility]  ROUNDING NOTE   Subjective:   Patient seen during dialysis   HEMODIALYSIS FLOWSHEET:  Blood Flow Rate (mL/min): 250 mL/min Arterial Pressure (mmHg): -100 mmHg Venous Pressure (mmHg): 40 mmHg Transmembrane Pressure (mmHg): 60 mmHg Ultrafiltration Rate (mL/min): 300 mL/min Dialysate Flow Rate (mL/min): 300 ml/min Conductivity: Machine : 14 Conductivity: Machine : 14 Dialysis Fluid Bolus: Normal Saline Bolus Amount (mL): 200 mL  Dialyzing in chair today   Objective:  Vital signs in last 24 hours:  Temp:  [97.4 F (36.3 C)-98 F (36.7 C)] 97.6 F (36.4 C) (07/28 1429) Pulse Rate:  [56-115] 56 (07/28 1429) Resp:  [20-26] 22 (07/28 1400) BP: (94-157)/(57-98) 121/88 (07/28 1429) SpO2:  [93 %-100 %] 100 % (07/28 1429) Weight:  [95.7 kg] 95.7 kg (07/28 0458)  Weight change:  Filed Weights   02/02/20 0544 02/04/20 0500 02/06/20 0458  Weight: (!) 92.5 kg (!) 92.2 kg (!) 95.7 kg    Intake/Output: I/O last 3 completed shifts: In: 1201.3 [P.O.:300; I.V.:195.3; Blood:306; IV Piggyback:400] Out: 79 [Urine:210; Other:500]   Intake/Output this shift:  Total I/O In: -  Out: 1088 [Other:1088]  Physical Exam: General: NAD, frail, elderly gentleman,   Head: Temporal wasting noted, edentulous  Eyes: Anicteric  Lungs:  +rhonchi bilaterally, normal effort, [place] o2  Heart: irregular   Abdomen:  Soft, nontender,    Extremities: 1-2+ LE edema  Neurologic:  Resting quietly, answers few simple questions  Access:  Right IJ PermCath.  Placed July 25.    Basic Metabolic Panel: Recent Labs  Lab 01/31/20 0541 01/31/20 0541 02/01/20 1048 02/01/20 1048 02/03/20 0652 02/05/20 0427 02/06/20 1053  NA 139  --  137  --  135 138 136  K 3.9  --  3.3*  --  4.2 4.4 4.2  CL 99  --  97*  --  94* 96* 94*  CO2 28  --  33*  --  27 29 29   GLUCOSE 130*  --  146*  --  120* 163* 179*  BUN 59*  --  34*  --  69* 85* 51*  CREATININE 6.46*  --  3.65*  --  6.97* 8.32*  5.77*  CALCIUM 7.8*   < > 7.8*   < > 7.8* 7.8* 7.7*  PHOS  --   --  2.7  --   --   --   --    < > = values in this interval not displayed.    Liver Function Tests: Recent Labs  Lab 02/01/20 1048  ALBUMIN 2.5*   No results for input(s): LIPASE, AMYLASE in the last 168 hours. No results for input(s): AMMONIA in the last 168 hours.  CBC: Recent Labs  Lab 02/01/20 1048 02/03/20 0652 02/05/20 0427 02/06/20 1053  WBC 7.9 6.3 6.3 5.7  NEUTROABS  --   --   --  4.5  HGB 9.3* 8.7* 8.8* 8.5*  HCT 29.4* 26.6* 26.9* 26.3*  MCV 90.2 88.4 88.5 88.3  PLT 53* 58* 106* 96*    Cardiac Enzymes: No results for input(s): CKTOTAL, CKMB, CKMBINDEX, TROPONINI in the last 168 hours.  BNP: Invalid input(s): POCBNP  CBG: Recent Labs  Lab 02/05/20 1338 02/05/20 1645 02/05/20 2055 02/06/20 0748 02/06/20 1442  GLUCAP 166* 170* 169* 108* 132*    Microbiology: Results for orders placed or performed during the hospital encounter of 01/23/20  Urine culture     Status: Abnormal   Collection Time: 01/23/20  9:40 AM   Specimen: Urine, Random  Result  Value Ref Range Status   Specimen Description   Final    URINE, RANDOM Performed at [facility], [address] [place], [place] [address]    Special Requests   Final    NONE Performed at [facility], [place], [place] [address]    Culture 70,000 COLONIES/mL STAPHYLOCOCCUS EPIDERMIDIS (A)  Final   Report Status 01/26/2020 FINAL  Final   Organism ID, Bacteria STAPHYLOCOCCUS EPIDERMIDIS (A)  Final      Susceptibility   Staphylococcus epidermidis - MIC*    CIPROFLOXACIN >=8 RESISTANT Resistant     GENTAMICIN <=0.5 SENSITIVE Sensitive     NITROFURANTOIN <=16 SENSITIVE Sensitive     OXACILLIN >=4 RESISTANT Resistant     TETRACYCLINE 2 SENSITIVE Sensitive     VANCOMYCIN 1 SENSITIVE Sensitive     TRIMETH/SULFA 80 RESISTANT Resistant     CLINDAMYCIN <=0.25 SENSITIVE Sensitive     RIFAMPIN <=0.5 SENSITIVE  Sensitive     Inducible Clindamycin NEGATIVE Sensitive     * 70,000 COLONIES/mL STAPHYLOCOCCUS EPIDERMIDIS  SARS Coronavirus 2 by RT PCR (hospital order, performed in [facility] hospital lab) Nasopharyngeal Nasopharyngeal Swab     Status: None   Collection Time: 01/23/20  4:00 PM   Specimen: Nasopharyngeal Swab  Result Value Ref Range Status   SARS Coronavirus 2 NEGATIVE NEGATIVE Final    Comment: (NOTE) SARS-CoV-2 target nucleic acids are NOT DETECTED.  The SARS-CoV-2 RNA is generally detectable in upper and lower respiratory specimens during the acute phase of infection. The lowest concentration of SARS-CoV-2 viral copies this assay can detect is 250 copies / mL. A negative result does not preclude SARS-CoV-2 infection and should not be used as the sole basis for treatment or other patient management decisions.  A negative result may occur with improper specimen collection / handling, submission of specimen other than nasopharyngeal swab, presence of viral mutation(s) within the areas targeted by this assay, and inadequate number of viral copies (<250 copies / mL). A negative result must be combined with clinical observations, patient history, and epidemiological information.  Fact Sheet for Patients:   StrictlyIdeas.no  Fact Sheet for Healthcare Providers: BankingDealers.co.za  This test is not yet approved or  cleared by the [place] FDA and has been authorized for detection and/or diagnosis of SARS-CoV-2 by FDA under an Emergency Use Authorization (EUA).  This EUA will remain in effect (meaning this test can be used) for the duration of the COVID-19 declaration under Section 564(b)(1) of the Act, 21 U.S.C. section 360bbb-3(b)(1), unless the authorization is terminated or revoked sooner.  Performed at [facility], [address] [place], [place] [address]   CULTURE, BLOOD (ROUTINE X 2) w Reflex to ID Panel      Status: None   Collection Time: 01/23/20  5:13 PM   Specimen: BLOOD  Result Value Ref Range Status   Specimen Description BLOOD BLOOD LEFT HAND  Final   Special Requests   Final    BOTTLES DRAWN AEROBIC AND ANAEROBIC Blood Culture adequate volume   Culture   Final    NO GROWTH 5 DAYS Performed at [facility], [address] [place], [place] [address]    Report Status 01/28/2020 FINAL  Final  CULTURE, BLOOD (ROUTINE X 2) w Reflex to ID Panel     Status: None   Collection Time: 01/23/20  6:12 PM   Specimen: BLOOD  Result Value Ref Range Status   Specimen Description BLOOD BLOOD LEFT WRIST  Final   Special Requests Blood Culture adequate volume  Final   Culture   Final    NO GROWTH 5 DAYS Performed at [facility], [address] [place], [place] [address]    Report Status 01/28/2020 FINAL  Final  MRSA PCR Screening     Status: Abnormal   Collection Time: 01/25/20  8:17 AM   Specimen: Nasopharyngeal  Result Value Ref Range Status   MRSA by PCR POSITIVE (A) NEGATIVE Final    Comment:        The GeneXpert MRSA Assay (FDA approved for NASAL specimens only), is one component of a comprehensive MRSA colonization surveillance program. It is not intended to diagnose MRSA infection nor to guide or monitor treatment for MRSA infections. RESULT CALLED TO, READ BACK BY AND VERIFIED WITH: MARIA [facility] 01/25/20 1945 SJL Performed at [facility], [address] [place], [place] [address]   Body fluid culture     Status: None   Collection Time: 01/30/20  2:40 PM   Specimen: PATH Cytology Pleural fluid  Result Value Ref Range Status   Specimen Description   Final    PLEURAL Performed at [facility], [address] [place], [place] [address]    Special Requests   Final    NONE Performed at [facility], [address] [place], [place] [address]    Gram Stain   Final    RARE WBC PRESENT, PREDOMINANTLY MONONUCLEAR NO  ORGANISMS SEEN    Culture   Final    NO GROWTH 3 DAYS Performed at [facility], [place] [address], [place], [place] [address]    Report Status 02/03/2020 FINAL  Final  Urine Culture     Status: Abnormal   Collection Time: 01/31/20 10:30 AM   Specimen: Urine, Random  Result Value Ref Range Status   Specimen Description   Final    URINE, RANDOM Performed at [facility], [address], [place], [place] [address]    Special Requests   Final    Normal Performed at [facility], [address] [place], [place] [address]    Culture MULTIPLE SPECIES PRESENT, SUGGEST RECOLLECTION (A)  Final   Report Status 02/01/2020 FINAL  Final  Body fluid culture     Status: None   Collection Time: 02/01/20  2:00 PM   Specimen: PATH Cytology Pleural fluid  Result Value Ref Range Status   Specimen Description   Final    PLEURAL Performed at [facility], [address] [place], [place] [address]    Special Requests   Final    NONE Performed at [facility], [place], [place] [address]    Gram Stain NO WBC SEEN NO ORGANISMS SEEN   Final   Culture   Final    NO GROWTH 3 DAYS Performed at [facility], [place] [address] [place],  [address]    Report Status 02/05/2020 FINAL  Final  MRSA PCR Screening     Status: Abnormal   Collection Time: 02/06/20 10:02 AM   Specimen: Nasopharyngeal  Result Value Ref Range Status   MRSA by PCR POSITIVE (A) NEGATIVE Final    Comment:        The GeneXpert MRSA Assay (FDA approved for NASAL specimens only), is one component of a comprehensive MRSA colonization surveillance program. It is not intended to diagnose MRSA infection nor to guide or monitor treatment for MRSA infections. RESULT CALLED TO,  READ BACK BY AND VERIFIED WITH: MORALES JORGE AT 7673 ON 02/06/2020 BY Epifanio Lesches S Performed at [facility], [address] [place], [place] [address]     Coagulation  Studies: No results for input(s): LABPROT, INR in the last 72 hours.  Urinalysis: No results for input(s): COLORURINE, LABSPEC, PHURINE, GLUCOSEU, HGBUR, BILIRUBINUR, KETONESUR, PROTEINUR, UROBILINOGEN, NITRITE, LEUKOCYTESUR in the last 72 hours.  Invalid input(s): APPERANCEUR    Imaging: PERIPHERAL VASCULAR CATHETERIZATION  Result Date: 02/04/2020 See op note    Medications:   . sodium chloride Stopped (02/04/20 1839)   . amoxicillin-clavulanate  1 tablet Oral QPM  . ferrous sulfate  325 mg Oral Q breakfast   And  . vitamin C  250 mg Oral Q breakfast  . azelastine  1 spray Each Nare BID  . Chlorhexidine Gluconate Cloth  6 each Topical Q0600  . epoetin (EPOGEN/PROCRIT) injection  10,000 Units Intravenous Q T,Th,Sa-HD  . feeding supplement (ENSURE ENLIVE)  237 mL Oral TID BM  . finasteride  5 mg Oral Daily  . insulin aspart  0-5 Units Subcutaneous QHS  . insulin aspart  0-9 Units Subcutaneous TID WC  . ipratropium-albuterol  3 mL Nebulization Q6H  . mouth rinse  15 mL Mouth Rinse BID  . metoprolol tartrate  50 mg Oral BID  . multivitamin  1 tablet Oral QHS  . mupirocin ointment  1 application Nasal BID  . pantoprazole  40 mg Oral Daily  . rosuvastatin  5 mg Oral QHS  . sodium bicarbonate  650 mg Oral BID  . tamsulosin  0.4 mg Oral Daily   sodium chloride, acetaminophen, ammonium lactate, HYDROmorphone (DILAUDID) injection, metoprolol tartrate, ondansetron (ZOFRAN) IV, ondansetron (ZOFRAN) IV  Assessment/ Plan:  Blake [name] is a 86 y.o. black male with acute renal failure on hemodialysis, hypertension, hyperlipidemia, diabetes mellitus type 2, GERD, obstructive sleep apnea, urothelial carcinoma of the bladder status post bilateral nephrostomy tube placement, permanent pacemaker placement secondary to sick sinus syndrome, SVT, who is admitted to [facility] on 01/23/2020 for Weakness [R53.1] SOB (shortness of breath) [R06.02] Acute renal failure superimposed on stage 4  chronic kidney disease ([place]) [N17.9, N18.4] Acute renal failure superimposed on chronic kidney disease, unspecified CKD stage, unspecified acute renal failure type ([place]) [N17.9, N18.9] Patient initiated on hemodialysis on 7/17 for uremic symptoms.   1. End Stage Renal Disease: PermCath placed July 26.  Seen during dialysis today.  Tolerating well. Family requesting placement at [facility].    discharge planning in progress Patient was able to sit in chair for the duration of dialysis today Will evaluate for need of dialysis on daily basis.  2. Anemia of chronic kidney disease.   Lab Results  Component Value Date   HGB 8.5 (L) 02/06/2020  Maintain Epogen 10,000 IV with dialysis treatments.  3. Hypertension -Continue metoprolol.  4. Bilateral pleural effusions: status post Right thoracentesis on 7/21 with 2.4 liters removed.  Left thoracentesis performed 02/01/2020.   LOS: [place] 7/28/20214:15 PM

## 2020-02-06 NOTE — Progress Notes (Signed)
Nutrition Follow Up Note   DOCUMENTATION CODES:   Severe malnutrition in context of chronic illness  INTERVENTION:   Increase Ensure Enlive po to TID, each supplement provides 350 kcal and 20 grams of protein  Vitamin C 250mg  po daily   Rena-vite daily   NUTRITION DIAGNOSIS:   Severe Malnutrition related to cancer and cancer related treatments as evidenced by moderate fat depletion, severe fat depletion, moderate muscle depletion, severe muscle depletion.  GOAL:   Patient will meet greater than or equal to 90% of their needs  -progressing   MONITOR:   PO intake, Supplement acceptance, Labs, Weight trends, Skin, I & O's  ASSESSMENT:   86 y.o. male with medical history significant of hypertension, hyperlipidemia, diabetes mellitus, GERD, OSA, prostate cancer, urothelial carcinoma of the bladder, s/p of bilateral nephrostomy, pacemaker placement due to SSS, GI bleeding, dCHF, SVT, CKD-IV, thrombocytopenia, chronic respiratory failure on 2 L nasal cannula oxygen at night at home, who presents with decreased urine output.   Pt s/p perm cath placement 7/26  Pt in HD at time of RD visit today. Per chart review, it appears that patients appetite is improving. Pt is also drinking Ensure supplements. RD will increase Ensure to three times daily now that renal labs improved. Per chart, pt is weight stable since admit.   Medications reviewed and include: augmentin, ferrous sulfate, vitamin C, epogen, rena-vite, insulin, protonix, Na bicarbonate   Labs reviewed: BUN 51(H), creat 5.77(H) P 2.7- 7/23 Hgb 8.5(L), Hct 26.3(L) cbgs- 166, 170, 169, 108, 132 x 24 hrs  Diet Order:   Diet Order            DIET - DYS 1 Room service appropriate? Yes; Fluid consistency: Thin  Diet effective now                EDUCATION NEEDS:   No education needs have been identified at this time  Skin:  Skin Assessment: Reviewed RN Assessment (Stage II sacrum, ecchymosis)  Last BM:  7/27- type  5  Height:   Ht Readings from Last 1 Encounters:  01/23/20 6' (1.829 m)    Weight:   Wt Readings from Last 1 Encounters:  02/06/20 (!) 95.7 kg    Ideal Body Weight:  80.9 kg  BMI:  Body mass index is 28.61 kg/m.  Estimated Nutritional Needs:   Kcal:  2000-2300kcal/day  Protein:  85-100g/day  Fluid:  2L/day or per MD  Koleen Distance MS, RD, LDN Please refer to [facility] for RD and/or RD on-call/weekend/after hours pager

## 2020-02-06 NOTE — Progress Notes (Signed)
Dr. Wallace Keller bedside and changed removal goal from  5 [place] Progression Recent Vital Signs   BP 100/77   Pulse (!) 106   Temp 97.8 F (36.6 C) (Axillary)   Resp (!) 24   Ht 6' (1.829 m)   Wt (!) 95.7 kg   SpO2 95%   BMI 28.61 kg/m    Past Medical History:  Diagnosis Date  . AAA (abdominal aortic aneurysm) ([place])   . Arrhythmia   . CHF (congestive heart failure) ([place])   . Diabetes mellitus without complication ([place])   . GERD (gastroesophageal reflux disease)   . GI bleed   . Heart murmur   . Hyperlipidemia   . Hypertension   . Pacemaker   . Pneumonia   . Prostate cancer ([place])   . Sleep apnea      Expected Discharge Date     Diet Order            DIET - DYS 1 Room service appropriate? Yes; Fluid consistency: Thin  Diet effective now                  VTE Documentation  Sequential compression devices, below knee   Work Intensity Score/Level of Care  3  @LEVELOFCARE @   Mobility  Head of Bed Elevated : HOB greater than 30 Activity: Turned to right side Range of Motion/Exercises: Passive, All extremities Level of Assistance: +2 (takes two people) Assistive Device: None Distance Ambulated (ft): 0 ft Mobility Response: Tolerated fair Mobility performed by: Nurse tech, Nurse Bed Position: High-fowlers (breakfast) Transport method: Bed     Consult Orders  (From admission, onward)         Start     Ordered   02/05/20 1535  PT eval and treat  Routine       Question:  Reason for PT?  Answer:  weakness.  needs to be seen   02/05/20 1535   01/30/20 1644  Consult to palliative care  Once       Provider:  (Not yet assigned)  Question Answer [facility] Consult   Reason for Consult? goals of care      01/30/20 1643   01/30/20 1636  Consult to palliative care  Once       Provider:  (Not yet assigned)  Question Answer [facility] Symptom Management Consult   Reason for Consult? Goals of care.      01/30/20 1643   01/28/20 1506  SLP eval and treat Reason for evaluation: .Swallowing evaluation (BSE, MBS and/or diet order as indicated)  Once       Question:  Reason for evaluation  Answer:  .Swallowing evaluation (BSE, MBS and/or diet order as indicated)   01/28/20 1505   01/28/20 1505  PT eval and treat  Routine        01/28/20 1504   01/28/20 1505  OT eval and treat  Routine        01/28/20 1504           Significant Events    DC Barriers   Abnormal Labs:  Blake [name] 02/06/2020, 12:26 PM to 1.5

## 2020-02-06 NOTE — Progress Notes (Signed)
Nephroligist beside and changed Uf to 1.5

## 2020-02-06 NOTE — Progress Notes (Signed)
PT Cancellation Note  Patient Details Name: Blake [name] MRN: [id number] DOB: [birth year]   Cancelled Treatment:    Reason Eval/Treat Not Completed: Patient at procedure or test/unavailable (Evaluation re-attempted. Patient off unit at dialysis.  Will re-attempt at later time/date as medically appropriate.)   Rasmus Preusser H. Owens Shark, PT, DPT, NCS 02/06/20, 3:46 PM [phone]

## 2020-02-06 NOTE — Progress Notes (Signed)
  Speech Language Pathology Treatment: Dysphagia  Patient Details Name: Blake [name] MRN: [id number] DOB: [birth year] Today's Date: 02/06/2020 Time: 6283-1517 SLP Time Calculation (min) (ACUTE ONLY): 30 min  Assessment / Plan / Recommendation Clinical Impression  Skilled treatment session targeted pt's dysphagia goals. SLP facilitated session by providing skilled observation of pt consuming thin liquids via straw. Pt was free of overt s/s of aspiration when consuming. Despite lots of encouragement, pt declined trying any advanced diet textures and stated he wanted to remain on his "same stuff" indicating puree. At this time, skilled ST intervention is not longer indicated. Education provided to pt's MD.    HPI HPI: Blake [name] is a 86 y.o. male with medical history significant of hypertension, hyperlipidemia, diabetes mellitus, GERD, OSA, prostate cancer, urothelial carcinoma of the bladder, s/p of bilateral nephrostomy, pacemaker placement due to SSS, GI bleeding, dCHF, SVT, CKD-IV, thrombocytopenia, chronic respiratory failure on 2 L nasal cannula oxygen at night at home, who presents with decreased urine output. Pt with end stage renal disease with HD started.       SLP Plan  Discharge SLP treatment due to (comment) (pt refused trials of upgraded textures)       Recommendations  Diet recommendations: Dysphagia 1 (puree);Thin liquid Liquids provided via: Straw Medication Administration: Whole meds with puree Supervision: Staff to assist with self feeding Compensations: Minimize environmental distractions;Slow rate;Small sips/bites Postural Changes and/or Swallow Maneuvers: Seated upright 90 degrees                 Blake [name] M.S., CCC-SLP, [place] Office [phone]  Oral Care Recommendations: Oral care BID Follow up Recommendations: None SLP Visit Diagnosis: Dysphagia, oral phase (R13.11) Plan: Discharge SLP  treatment due to (comment) (pt refused trials of upgraded textures)       GO                Blake [name] 02/06/2020, 5:01 PM

## 2020-02-06 NOTE — Progress Notes (Signed)
NTS attempted on pt. Pt was unable to tolerate the procedure and refused any more attempts.

## 2020-02-06 NOTE — Progress Notes (Signed)
Pt not feeling well and  Stated feet craps. UF turned off at 200 bolus given

## 2020-02-06 NOTE — Progress Notes (Signed)
Rechcking. Not consistent with prior  Readings

## 2020-02-06 NOTE — TOC Progression Note (Signed)
Transition of Care [facility]) - Progression Note    Patient Details  Name: Blake [name] MRN: [id number] Date of Birth: [birth year]  [facility] CM/SW Contact  Beverly Sessions, RN Phone Number: 02/06/2020, 2:55 PM  Clinical Narrative:     PT/OT pending Outpatient HD schedule still pending   Expected Discharge Plan: [place] Barriers to Discharge: Continued Medical Work up  Expected Discharge Plan and Services Expected Discharge Plan: [place] arrangements for the past 2 months: Single Family Home                                       Social Determinants of Health (SDOH) Interventions    Readmission Risk Interventions Readmission Risk Prevention Plan 01/25/2020 09/25/2019 12/30/2018  Transportation Screening Complete Complete Complete  PCP or Specialist Appt within 3-5 Days - Complete -  [place] or [place] - Complete -  Social Work Consult for [place] Planning/Counseling - Complete -  Palliative Care Screening - Not Applicable -  Medication Review Press photographer) Complete Referral to Pharmacy Complete  [place] or [place] Complete - [place] Patient Refused - -  Some recent data might be hidden

## 2020-02-06 NOTE — Progress Notes (Signed)
PROGRESS NOTE    Blake [name]  GOT:[id number] DOB: 1923/06/27 DOA: 01/23/2020 PCP: Adin Hector, MD  Brief Narrative: (Start on day 1 of progress note - keep it brief and live) HPI: Blake [name] is a 86 y.o. male with medical history significant of hypertension, hyperlipidemia, diabetes mellitus, GERD, OSA, prostate cancer, urothelial carcinoma of the bladder, s/p of bilateral nephrostomy, pacemaker placement due to SSS, GI bleeding, dCHF, SVT, CKD-IV, thrombocytopenia, chronic respiratory failure on 2 L nasal cannula oxygen at night at home, who presents with decreased urine output.  Per his son and daughter, pt has hx of urothelial carcinoma of the bladder and is s/p of bilateral nephrostomy. Pt was noted to have decreased urine output at home. Not sure if pt has symptoms of UTI.  Does not seem to have hematuria. Pt is using 2L oxygen at night at home, but not in the daytime.  Patient was found to have oxygen desaturated to 88% on room air, which improved to 95 to 96% on 2 L nasal cannula oxygen in ED. patient seems to have mild shortness breath, but no cough, chest pain.  He has left lower leg edema. No fever or chills.  Patient has little loose stool bowel movement recently, but no active diarrhea, nausea, vomiting, abdominal pain per his son. Per EDP, pt recently had cough and URI. Also reportedly recently finished antibiotics. Pt knows his own name, but not oriented to place and time.   7/28: Patient seen and examined.  Care assumed from previous hospitalist today.  Overall my evaluation patient is a little lethargic but otherwise in no acute distress.  He is scheduled for hemodialysis today.  He remains on 2 L nasal cannula.   Assessment & Plan:   Principal Problem:   Acute renal failure superimposed on stage 4 chronic kidney disease (HCC) Active Problems:   SVT (supraventricular tachycardia) (HCC)   Atrial fibrillation with RVR (HCC)   HLD (hyperlipidemia)   Bleeding    Pressure injury, stage 3 (HCC)   Recurrent UTI   Urothelial carcinoma of bladder (HCC)   Thrombocytopenia (HCC)   Acute on chronic diastolic CHF (congestive heart failure) (HCC)   HTN (hypertension)   Type II diabetes mellitus with renal manifestations (HCC)   Acute respiratory failure with hypoxia (HCC)   Normocytic anemia   Protein-calorie malnutrition, severe   Pleural effusion   Pressure injury of buttock, stage 2 (HCC)   ESRD (end stage renal disease) (HCC)   Drainage from wound   Tachycardia   Weakness   Non-sustained ventricular tachycardia (HCC)   AF (paroxysmal atrial fibrillation) (HCC)  End-stage renal disease on hemodialysis Bilateral nephrostomy tubes Patient has permacath in place Will need to tolerate dialysis sitting up and have an outpatient on dialysis but before any disposition issues Bleeding from PermCath site resolved No platelet count recorded today Plan: Dialysis inpatient per nephrology Nephrology will work on setting up outpatient dialysis but Monitor permacath site for any bleeding Disposition pending tolerance of dialysis and outpatient dialysis chair  Bilateral pleural effusions Status post right thoracentesis on 01/30/2020, 2400 cc fluid removed Status post left thoracentesis 02/01/2020, 750 cc removed  Paroxysmal atrial fibrillation Supraventricular tachycardia NSVT Heart rate stable over interval Continue metoprolol twice daily  Acute hypoxic respiratory failure Currently on 2 L oxygen Dialysis as above Wean oxygen as tolerated  Acute on chronic diastolic congestive heart failure HD for fluid removal  Thrombocytopenia Platelet count responded to platelet transfusion No pericatheter bleeding noted  Chronic decubitus ulcer Present on admission Stage II on sacrum WC consult  Weakness Functional decline PT evaluation ordered  Right nephrostomy tube drainage Evaluated by IR Tube not exchanged Was empirically started on  vancomycin and Zosyn De-escalate vancomycin and Augmentin Unclear whether vancomycin is truly indicated MRSA screen ordered If negative can DC vancomycin.  If positive will continue 7-day course    DVT prophylaxis: SCD Code Status: Full Family Communication: None at bedside today Disposition Plan: Status is: Inpatient  Remains inpatient appropriate because:Inpatient level of care appropriate due to severity of illness   Dispo: The patient is from: Home              Anticipated d/c is to: Home              Anticipated d/c date is: 3 days              Patient currently is not medically stable to d/c.  Patient still with acute hypoxic respiratory failure as well as new start hemodialysis.  Suspect that patient may be stable for discharge once we get him off supplemental oxygen.  He will still need tolerance of dialysis while sitting up in chair as well as assignment of outpatient HD chair prior to disposition       Consultants:   Nephrology  Procedures:   Permacath  Antimicrobials:   Vancomycin  Augmentin   Subjective: Chief complaint: Shortness of breath Patient reports his breathing has improved.  Endorses some fatigue.  Scheduled for hemodialysis today.  Objective: Vitals:   02/06/20 1100 02/06/20 1115 02/06/20 1130 02/06/20 1145  BP: 114/71 110/79 127/79 (!) 130/77  Pulse:  97 97 101  Resp: 21 21 22 23   Temp:      TempSrc:      SpO2:   100% 95%  Weight:      Height:        Intake/Output Summary (Last 24 hours) at 02/06/2020 1205 Last data filed at 02/05/2020 1902 Gross per 24 hour  Intake --  Output 550 ml  Net -550 ml   Filed Weights   02/02/20 0544 02/04/20 0500 02/06/20 0458  Weight: (!) 92.5 kg (!) 92.2 kg (!) 95.7 kg    Examination:  General: No apparent distress, patient appears well HEENT: Normocephalic, atraumatic Neck, supple, trachea midline, no tenderness Heart: Irregular rhythm, normal rate, S1/S2 normal, no murmurs Lungs:  Decreased breath sounds and rhonchi on the right, decreased breath sounds and rhonchi left, normal work of breathing Abdomen: Soft, nontender, nondistended, positive bowel sounds Extremities: Normal, atraumatic, bilateral ankle swelling, normal muscle tone Skin: No rashes or lesions, normal color Neurologic: Cranial nerves grossly intact, sensation intact, alert and oriented x3 Psychiatric: Normal affect   Data Reviewed: I have personally reviewed following labs and imaging studies  CBC: Recent Labs  Lab 02/01/20 1048 02/03/20 0652 02/05/20 0427 02/06/20 1053  WBC 7.9 6.3 6.3 5.7  NEUTROABS  --   --   --  4.5  HGB 9.3* 8.7* 8.8* 8.5*  HCT 29.4* 26.6* 26.9* 26.3*  MCV 90.2 88.4 88.5 88.3  PLT 53* 58* 106* 96*   Basic Metabolic Panel: Recent Labs  Lab 01/31/20 0541 02/01/20 1048 02/03/20 0652 02/05/20 0427 02/06/20 1053  NA 139 137 135 138 136  K 3.9 3.3* 4.2 4.4 4.2  CL 99 97* 94* 96* 94*  CO2 28 33* 27 29 29   GLUCOSE 130* 146* 120* 163* 179*  BUN 59* 34* 69* 85* 51*  CREATININE 6.46* 3.65*  6.97* 8.32* 5.77*  CALCIUM 7.8* 7.8* 7.8* 7.8* 7.7*  PHOS  --  2.7  --   --   --    GFR: Estimated Creatinine Clearance: 9.2 mL/min (A) (by C-G formula based on SCr of 5.77 mg/dL (H)). Liver Function Tests: Recent Labs  Lab 02/01/20 1048  ALBUMIN 2.5*   No results for input(s): LIPASE, AMYLASE in the last 168 hours. No results for input(s): AMMONIA in the last 168 hours. Coagulation Profile: No results for input(s): INR, PROTIME in the last 168 hours. Cardiac Enzymes: No results for input(s): CKTOTAL, CKMB, CKMBINDEX, TROPONINI in the last 168 hours. BNP (last 3 results) No results for input(s): PROBNP in the last 8760 hours. HbA1C: No results for input(s): HGBA1C in the last 72 hours. CBG: Recent Labs  Lab 02/05/20 0757 02/05/20 1338 02/05/20 1645 02/05/20 2055 02/06/20 0748  GLUCAP 125* 166* 170* 169* 108*   Lipid Profile: No results for input(s): CHOL, HDL,  LDLCALC, TRIG, CHOLHDL, LDLDIRECT in the last 72 hours. Thyroid Function Tests: No results for input(s): TSH, T4TOTAL, FREET4, T3FREE, THYROIDAB in the last 72 hours. Anemia Panel: No results for input(s): VITAMINB12, FOLATE, FERRITIN, TIBC, IRON, RETICCTPCT in the last 72 hours. Sepsis Labs: No results for input(s): PROCALCITON, LATICACIDVEN in the last 168 hours.  Recent Results (from the past 240 hour(s))  Body fluid culture     Status: None   Collection Time: 01/30/20  2:40 PM   Specimen: PATH Cytology Pleural fluid  Result Value Ref Range Status   Specimen Description   Final    PLEURAL Performed at [facility], [address], [place], [place] [address]    Special Requests   Final    NONE Performed at [facility], [address] [place], [place] [address]    Gram Stain   Final    RARE WBC PRESENT, PREDOMINANTLY MONONUCLEAR NO ORGANISMS SEEN    Culture   Final    NO GROWTH 3 DAYS Performed at [facility] [address], [place], [place] [address]    Report Status 02/03/2020 FINAL  Final  Urine Culture     Status: Abnormal   Collection Time: 01/31/20 10:30 AM   Specimen: Urine, Random  Result Value Ref Range Status   Specimen Description   Final    URINE, RANDOM Performed at [facility], [address] [place], [place] [address]    Special Requests   Final    Normal Performed at [facility], [address] [place], [place] [address]    Culture MULTIPLE SPECIES PRESENT, SUGGEST RECOLLECTION (A)  Final   Report Status 02/01/2020 FINAL  Final  Body fluid culture     Status: None   Collection Time: 02/01/20  2:00 PM   Specimen: PATH Cytology Pleural fluid  Result Value Ref Range Status   Specimen Description   Final    PLEURAL Performed at [facility], [address] [place], [place] [address]    Special Requests   Final    NONE Performed at [facility], [place], [place] [address]    Gram Stain NO WBC SEEN NO ORGANISMS SEEN   Final   Culture   Final    NO GROWTH 3 DAYS Performed at [facility], [place] [address], [place], [place] [address]    Report Status 02/05/2020 FINAL  Final  MRSA PCR Screening     Status: Abnormal   Collection Time: 02/06/20  10:02 AM   Specimen: Nasopharyngeal  Result Value Ref Range Status   MRSA by PCR POSITIVE (A) NEGATIVE Final    Comment:        The GeneXpert MRSA Assay (FDA approved for NASAL specimens only), is one component of a comprehensive MRSA colonization surveillance program. It is not intended to diagnose MRSA infection nor to guide or monitor treatment for MRSA infections. RESULT CALLED TO, READ BACK BY AND VERIFIED WITH: MORALES JORGE AT 4158 ON 02/06/2020 BY SAINVILUS S Performed at [facility], [address] [place], [place] [address]          Radiology Studies: PERIPHERAL VASCULAR CATHETERIZATION  Result Date: 02/04/2020 See op note       Scheduled Meds: . amoxicillin-clavulanate  1 tablet Oral QPM  . ferrous sulfate  325 mg Oral Q breakfast   And  . vitamin C  250 mg Oral Q breakfast  . azelastine  1 spray Each Nare BID  . Chlorhexidine Gluconate Cloth  6 each Topical Q0600  . epoetin (EPOGEN/PROCRIT) injection  10,000 Units Intravenous Q T,Th,Sa-HD  . feeding supplement (ENSURE ENLIVE)  237 mL Oral BID BM  . finasteride  5 mg Oral Daily  . insulin aspart  0-5 Units Subcutaneous QHS  . insulin aspart  0-9 Units Subcutaneous TID WC  . ipratropium-albuterol  3 mL Nebulization Q6H  . mouth rinse  15 mL Mouth Rinse BID  . metoprolol tartrate  50 mg Oral BID  . multivitamin  1 tablet Oral QHS  . pantoprazole  40 mg Oral Daily  . rosuvastatin  5 mg Oral QHS  . sodium bicarbonate  650 mg Oral BID  . tamsulosin  0.4 mg Oral Daily   Continuous Infusions: . sodium chloride Stopped (02/04/20 1839)     LOS: 14 days    Time spent: 25  minutes    Sidney Ace, MD [facility]   If 7PM-7AM, please contact night-coverage 02/06/2020, 12:05 PM

## 2020-02-07 DIAGNOSIS — J9601 Acute respiratory failure with hypoxia: Secondary | ICD-10-CM | POA: Diagnosis not present

## 2020-02-07 DIAGNOSIS — I480 Paroxysmal atrial fibrillation: Secondary | ICD-10-CM | POA: Diagnosis not present

## 2020-02-07 DIAGNOSIS — I5033 Acute on chronic diastolic (congestive) heart failure: Secondary | ICD-10-CM | POA: Diagnosis not present

## 2020-02-07 DIAGNOSIS — N179 Acute kidney failure, unspecified: Secondary | ICD-10-CM | POA: Diagnosis not present

## 2020-02-07 LAB — CBC WITH DIFFERENTIAL/PLATELET
Abs Immature Granulocytes: 0.04 10*3/uL (ref 0.00–0.07)
Basophils Absolute: 0 10*3/uL (ref 0.0–0.1)
Basophils Relative: 1 %
Eosinophils Absolute: 0.1 10*3/uL (ref 0.0–0.5)
Eosinophils Relative: 2 %
HCT: 25.2 % — ABNORMAL LOW (ref 39.0–52.0)
Hemoglobin: 8.2 g/dL — ABNORMAL LOW (ref 13.0–17.0)
Immature Granulocytes: 1 %
Lymphocytes Relative: 8 %
Lymphs Abs: 0.4 10*3/uL — ABNORMAL LOW (ref 0.7–4.0)
MCH: 28.8 pg (ref 26.0–34.0)
MCHC: 32.5 g/dL (ref 30.0–36.0)
MCV: 88.4 fL (ref 80.0–100.0)
Monocytes Absolute: 0.6 10*3/uL (ref 0.1–1.0)
Monocytes Relative: 11 %
Neutro Abs: 4.1 10*3/uL (ref 1.7–7.7)
Neutrophils Relative %: 77 %
Platelets: 83 10*3/uL — ABNORMAL LOW (ref 150–400)
RBC: 2.85 MIL/uL — ABNORMAL LOW (ref 4.22–5.81)
RDW: 17.5 % — ABNORMAL HIGH (ref 11.5–15.5)
WBC: 5.3 10*3/uL (ref 4.0–10.5)
nRBC: 0 % (ref 0.0–0.2)

## 2020-02-07 LAB — BASIC METABOLIC PANEL
Anion gap: 12 (ref 5–15)
BUN: 35 mg/dL — ABNORMAL HIGH (ref 8–23)
CO2: 31 mmol/L (ref 22–32)
Calcium: 7.7 mg/dL — ABNORMAL LOW (ref 8.9–10.3)
Chloride: 96 mmol/L — ABNORMAL LOW (ref 98–111)
Creatinine, Ser: 4.59 mg/dL — ABNORMAL HIGH (ref 0.61–1.24)
GFR calc Af Amer: 12 mL/min — ABNORMAL LOW (ref >60)
GFR calc non Af Amer: 10 mL/min — ABNORMAL LOW (ref >60)
Glucose, Bld: 130 mg/dL — ABNORMAL HIGH (ref 70–99)
Potassium: 4.3 mmol/L (ref 3.5–5.1)
Sodium: 139 mmol/L (ref 135–145)

## 2020-02-07 LAB — GLUCOSE, CAPILLARY
Glucose-Capillary: 110 mg/dL — ABNORMAL HIGH (ref 70–99)
Glucose-Capillary: 155 mg/dL — ABNORMAL HIGH (ref 70–99)
Glucose-Capillary: 167 mg/dL — ABNORMAL HIGH (ref 70–99)
Glucose-Capillary: 144 mg/dL — ABNORMAL HIGH (ref 70–99)

## 2020-02-07 MED ORDER — MIDODRINE HCL 5 MG PO TABS
5.0000 mg | ORAL_TABLET | Freq: Three times a day (TID) | ORAL | Status: DC
  Administered 2020-02-07 – 2020-02-11 (×11): 5 mg via ORAL
  Filled 2020-02-07 (×15): qty 1

## 2020-02-07 NOTE — Progress Notes (Signed)
[facility]  ROUNDING NOTE   Subjective:   Underwent dialysis yesterday 1000 cc fluid removed Post hemodialysis blood pressures have been soft Denies any acute complaints today Somnolent and frail appearing today  Objective:  Vital signs in last 24 hours:  Temp:  [97.6 F (36.4 C)-98.5 F (36.9 C)] 98.5 F (36.9 C) (07/29 1029) Pulse Rate:  [56-115] 76 (07/29 1029) Resp:  [18-26] 20 (07/29 1029) BP: (85-157)/(48-98) 106/69 (07/29 1044) SpO2:  [95 %-100 %] 97 % (07/29 1029)  Weight change:  Filed Weights   02/02/20 0544 02/04/20 0500 02/06/20 0458  Weight: (!) 92.5 kg (!) 92.2 kg (!) 95.7 kg    Intake/Output: I/O last 3 completed shifts: In: 360 [P.O.:360] Out: 1188 [Urine:100; IRJJO:8416]   Intake/Output this shift:  Total I/O In: 240 [P.O.:240] Out: -   Physical Exam: General: NAD, frail, elderly gentleman,   Head: Temporal wasting noted, edentulous  Eyes: Anicteric  Lungs:  +rhonchi bilaterally, normal effort, Welcome o2  Heart: irregular   Abdomen:  Soft, nontender,    Extremities: 1-2+ LE edema  Neurologic:  Resting quietly, answers few simple questions  Access:  Right IJ PermCath.  Placed July 25.    Basic Metabolic Panel: Recent Labs  Lab 02/01/20 1048 02/01/20 1048 02/03/20 0652 02/03/20 0652 02/05/20 0427 02/06/20 1053 02/07/20 0540  NA 137  --  135  --  138 136 139  K 3.3*  --  4.2  --  4.4 4.2 4.3  CL 97*  --  94*  --  96* 94* 96*  CO2 33*  --  27  --  29 29 31   GLUCOSE 146*  --  120*  --  163* 179* 130*  BUN 34*  --  69*  --  85* 51* 35*  CREATININE 3.65*  --  6.97*  --  8.32* 5.77* 4.59*  CALCIUM 7.8*   < > 7.8*   < > 7.8* 7.7* 7.7*  PHOS 2.7  --   --   --   --   --   --    < > = values in this interval not displayed.    Liver Function Tests: Recent Labs  Lab 02/01/20 1048  ALBUMIN 2.5*   No results for input(s): LIPASE, AMYLASE in the last 168 hours. No results for input(s): AMMONIA in the last 168  hours.  CBC: Recent Labs  Lab 02/01/20 1048 02/03/20 0652 02/05/20 0427 02/06/20 1053 02/07/20 0540  WBC 7.9 6.3 6.3 5.7 5.3  NEUTROABS  --   --   --  4.5 4.1  HGB 9.3* 8.7* 8.8* 8.5* 8.2*  HCT 29.4* 26.6* 26.9* 26.3* 25.2*  MCV 90.2 88.4 88.5 88.3 88.4  PLT 53* 58* 106* 96* 83*    Cardiac Enzymes: No results for input(s): CKTOTAL, CKMB, CKMBINDEX, TROPONINI in the last 168 hours.  BNP: Invalid input(s): POCBNP  CBG: Recent Labs  Lab 02/06/20 0748 02/06/20 1442 02/06/20 1647 02/06/20 2059 02/07/20 0749  GLUCAP 108* 132* 133* 172* 110*    Microbiology: Results for orders placed or performed during the hospital encounter of 01/23/20  Urine culture     Status: Abnormal   Collection Time: 01/23/20  9:40 AM   Specimen: Urine, Random  Result Value Ref Range Status   Specimen Description   Final    URINE, RANDOM Performed at [facility], [address], [place], [place] [address]    Special Requests   Final    NONE Performed at [facility],  [place], [place] [address]    Culture 70,000 COLONIES/mL STAPHYLOCOCCUS EPIDERMIDIS (A)  Final   Report Status 01/26/2020 FINAL  Final   Organism ID, Bacteria STAPHYLOCOCCUS EPIDERMIDIS (A)  Final      Susceptibility   Staphylococcus epidermidis - MIC*    CIPROFLOXACIN >=8 RESISTANT Resistant     GENTAMICIN <=0.5 SENSITIVE Sensitive     NITROFURANTOIN <=16 SENSITIVE Sensitive     OXACILLIN >=4 RESISTANT Resistant     TETRACYCLINE 2 SENSITIVE Sensitive     VANCOMYCIN 1 SENSITIVE Sensitive     TRIMETH/SULFA 80 RESISTANT Resistant     CLINDAMYCIN <=0.25 SENSITIVE Sensitive     RIFAMPIN <=0.5 SENSITIVE Sensitive     Inducible Clindamycin NEGATIVE Sensitive     * 70,000 COLONIES/mL STAPHYLOCOCCUS EPIDERMIDIS  SARS Coronavirus 2 by RT PCR (hospital order, performed in [place] hospital lab) Nasopharyngeal Nasopharyngeal Swab     Status: None   Collection Time: 01/23/20  4:00 PM    Specimen: Nasopharyngeal Swab  Result Value Ref Range Status   SARS Coronavirus 2 NEGATIVE NEGATIVE Final    Comment: (NOTE) SARS-CoV-2 target nucleic acids are NOT DETECTED.  The SARS-CoV-2 RNA is generally detectable in upper and lower respiratory specimens during the acute phase of infection. The lowest concentration of SARS-CoV-2 viral copies this assay can detect is 250 copies / mL. A negative result does not preclude SARS-CoV-2 infection and should not be used as the sole basis for treatment or other patient management decisions.  A negative result may occur with improper specimen collection / handling, submission of specimen other than nasopharyngeal swab, presence of viral mutation(s) within the areas targeted by this assay, and inadequate number of viral copies (<250 copies / mL). A negative result must be combined with clinical observations, patient history, and epidemiological information.  Fact Sheet for Patients:   StrictlyIdeas.no  Fact Sheet for Healthcare Providers: BankingDealers.co.za  This test is not yet approved or  cleared by the [place] FDA and has been authorized for detection and/or diagnosis of SARS-CoV-2 by FDA under an Emergency Use Authorization (EUA).  This EUA will remain in effect (meaning this test can be used) for the duration of the COVID-19 declaration under Section 564(b)(1) of the Act, 21 U.S.C. section 360bbb-3(b)(1), unless the authorization is terminated or revoked sooner.  Performed at [facility], [address] [place], [place] [address]   CULTURE, BLOOD (ROUTINE X 2) w Reflex to ID Panel     Status: None   Collection Time: 01/23/20  5:13 PM   Specimen: BLOOD  Result Value Ref Range Status   Specimen Description BLOOD BLOOD LEFT HAND  Final   Special Requests   Final    BOTTLES DRAWN AEROBIC AND ANAEROBIC Blood Culture adequate volume   Culture   Final    NO GROWTH 5  DAYS Performed at [facility], [address] [place], [place] [address]    Report Status 01/28/2020 FINAL  Final  CULTURE, BLOOD (ROUTINE X 2) w Reflex to ID Panel     Status: None   Collection Time: 01/23/20  6:12 PM   Specimen: BLOOD  Result Value Ref Range Status   Specimen Description BLOOD BLOOD LEFT WRIST  Final   Special Requests Blood Culture adequate volume  Final   Culture   Final    NO GROWTH 5 DAYS Performed at [facility], [address] [place], [place] [address]    Report Status 01/28/2020 FINAL  Final  MRSA PCR Screening     Status: Abnormal   Collection Time: 01/25/20  8:17 AM   Specimen: Nasopharyngeal  Result Value Ref Range Status   MRSA by PCR POSITIVE (A) NEGATIVE Final    Comment:        The GeneXpert MRSA Assay (FDA approved for NASAL specimens only), is one component of a comprehensive MRSA colonization surveillance program. It is not intended to diagnose MRSA infection nor to guide or monitor treatment for MRSA infections. RESULT CALLED TO, READ BACK BY AND VERIFIED WITH: [facility] 01/25/20 1945 SJL Performed at [facility], [address] [place], [place] [address]   Body fluid culture     Status: None   Collection Time: 01/30/20  2:40 PM   Specimen: PATH Cytology Pleural fluid  Result Value Ref Range Status   Specimen Description   Final    PLEURAL Performed at [facility], [address], [place], [place] [address]    Special Requests   Final    NONE Performed at [facility], [address] [place], [place] [address]    Gram Stain   Final    RARE WBC PRESENT, PREDOMINANTLY MONONUCLEAR NO ORGANISMS SEEN    Culture   Final    NO GROWTH 3 DAYS Performed at [facility], [place] [address] [place], [place] [address]    Report Status 02/03/2020 FINAL  Final  Urine Culture     Status: Abnormal   Collection Time: 01/31/20 10:30 AM   Specimen: Urine, Random  Result Value  Ref Range Status   Specimen Description   Final    URINE, RANDOM Performed at [facility], [address] [place], [place] [address]    Special Requests   Final    Normal Performed at [facility], [address] [place], [place] [address]    Culture MULTIPLE SPECIES PRESENT, SUGGEST RECOLLECTION (A)  Final   Report Status 02/01/2020 FINAL  Final  Body fluid culture     Status: None   Collection Time: 02/01/20  2:00 PM   Specimen: PATH Cytology Pleural fluid  Result Value Ref Range Status   Specimen Description   Final    PLEURAL Performed at [facility], [address], [place], [place] [address]    Special Requests   Final    NONE Performed at [facility], [place], [place] [address]    Gram Stain NO WBC SEEN NO ORGANISMS SEEN   Final   Culture   Final    NO GROWTH 3 DAYS Performed at [facility], [place] [address] [place], [place] [address]    Report Status 02/05/2020 FINAL  Final  MRSA PCR Screening     Status: Abnormal   Collection Time: 02/06/20 10:02 AM   Specimen: Nasopharyngeal  Result Value Ref Range Status   MRSA by PCR POSITIVE (A) NEGATIVE Final    Comment:        The GeneXpert MRSA Assay (FDA approved for NASAL specimens only), is one component of a comprehensive MRSA colonization surveillance program. It is not intended to diagnose MRSA infection nor to guide or monitor treatment for MRSA infections. RESULT CALLED TO, READ BACK BY AND VERIFIED WITH: MORALES JORGE AT 6160 ON 02/06/2020 BY Epifanio Lesches S Performed at [facility], [address] [place], [place] [address]     Coagulation Studies: No results for input(s): LABPROT, INR in the last 72 hours.  Urinalysis: No results for input(s): COLORURINE, LABSPEC, PHURINE, GLUCOSEU, HGBUR, BILIRUBINUR, KETONESUR, PROTEINUR, UROBILINOGEN, NITRITE, LEUKOCYTESUR in the last 72 hours.  Invalid input(s): APPERANCEUR     Imaging: No results found.   Medications:   . sodium chloride Stopped (02/04/20 1839)   . ferrous sulfate  325 mg Oral Q breakfast   And  . vitamin C  250 mg Oral Q breakfast  . azelastine  1 spray Each Nare BID  . Chlorhexidine Gluconate Cloth  6 each Topical Q0600  . epoetin (EPOGEN/PROCRIT) injection  10,000 Units Intravenous Q T,Th,Sa-HD  . feeding supplement (ENSURE ENLIVE)  237 mL Oral TID BM  . finasteride  5 mg Oral Daily  . insulin aspart  0-5 Units Subcutaneous QHS  . insulin aspart  0-9 Units Subcutaneous TID WC  . ipratropium-albuterol  3 mL Nebulization Q6H  . mouth rinse  15 mL Mouth Rinse BID  . metoprolol tartrate  50 mg Oral BID  . multivitamin  1 tablet Oral QHS  . mupirocin ointment  1 application Nasal BID  . pantoprazole  40 mg Oral Daily  . rosuvastatin  5 mg Oral QHS  . sodium bicarbonate  650 mg Oral BID  . tamsulosin  0.4 mg Oral Daily   sodium chloride, acetaminophen, ammonium lactate, HYDROmorphone (DILAUDID) injection, metoprolol tartrate, ondansetron (ZOFRAN) IV, ondansetron (ZOFRAN) IV  Assessment/ Plan:  [name] is a 86 y.o. black male with acute renal failure on hemodialysis, hypertension, hyperlipidemia, diabetes mellitus type 2, GERD, obstructive sleep apnea, urothelial carcinoma of the bladder status post bilateral nephrostomy tube placement, permanent pacemaker placement secondary to sick sinus syndrome, SVT, who is admitted to [facility] on 01/23/2020 for Weakness [R53.1] SOB (shortness of breath) [R06.02] Acute renal failure superimposed on stage 4 chronic kidney disease ([place]) [N17.9, N18.4] Acute renal failure superimposed on chronic kidney disease, unspecified CKD stage, unspecified acute renal failure type ([place]) [N17.9, N18.9] Patient initiated on hemodialysis on 7/17 for uremic symptoms.   1. End Stage Renal Disease: PermCath placed July 26.  Family requesting placement at [facility].    discharge planning in  progress.  Patient will get TTS shift Patient was able to sit in chair for the duration of dialysis on Wednesday. Plan for dialysis on Friday.  Subsequently, patient can be discharged if medically ready.  He will start outpatient dialysis on Tuesday May discontinue sodium bicarbonate tablets  2. Anemia of chronic kidney disease.   Lab Results  Component Value Date   HGB 8.2 (L) 02/07/2020  Maintain Epogen 10,000 IV with dialysis treatments.  3.  Atrial fibrillation with hypotension -Continue metoprolol for rate control -Midodrine for blood pressure  4. Bilateral pleural effusions: status post Right thoracentesis on 7/21 with 2.4 liters removed.  Left thoracentesis performed 02/01/2020.   LOS: [place] 7/29/202110:46 AM

## 2020-02-07 NOTE — Progress Notes (Signed)
Following this patient for outpatient hemodialysis placement. Patient daughter chose [facility], referral was sent yesterday. Waiting on clinic acceptance.   Elvera Bicker Dialysis Coordinator [phone]

## 2020-02-07 NOTE — Evaluation (Signed)
Physical Therapy Evaluation Patient Details Name: Blake [name] MRN: [id number] DOB: 1922-07-29 Today's Date: 02/07/2020   History of Present Illness  Blake [name] is a 86 y/o male who was admitted for acute renal failure superimporsed on stage IV CKD. Chief complaitns included cedreased urin output and L lower leg edema.  Pt now with perm cath usuable for PT and cleared to participate with therapy. PMH includes HTN, HLD, DM, OSA, prostate cancer, urothelial carcinoma of bladder, c/p bilateral nephrostomy, pacemaker placed d/t SSS, dCHF, SVT, CKD stage IV, thrombocytopenia, and chronic respiratory failure on 2L O2 at night.   Clinical Impression  Pt in bed upon arrival to room asleep. Pt able to respond to some questions however leaves his eyes closed throughout session. Pt mumbling his speech and difficult to understand many times. Pt able to participate in UE strength testing following single step commands to complete however with decreased participation noted with BLE testing. Attempted rolling for bed mobility and pt followed commands to grasp clinician's hand however no initiation of movement performed requiring max A for movement to roll 1/2 to 3/4 sidelying position. Pt with generalized weakness and anticipate he will be a +2 assist for bed mobility, transfers, and ambulation. Pt limited due to decreased arousal and alertness throughout session. Will pick up pt on caseload on trial basis of 3 sessions to see if pt able and willing to actively participate. Recommend STR at discharge due to current level of mobility however understand that pt's daughter and son wish to take pt home. If this is the case, recommend hospital bed, HHPT, and continuation of previous caretaker assistance.    Follow Up Recommendations SNF    Equipment Recommendations  Hospital bed    Recommendations for Other Services       Precautions / Restrictions Precautions Precautions: Fall Restrictions Weight Bearing  Restrictions: No      Mobility  Bed Mobility Overal bed mobility: Needs Assistance Bed Mobility: Rolling Rolling: Max assist         General bed mobility comments: pt able to grasp with L hand and pull with LUE however required max A to initiate movement and come into sidelying position with verbal cues for sequencing  Transfers                 General transfer comment: deferred as unsafe at this time due to decreased arousal  Ambulation/Gait             General Gait Details: deferred as unsafe d/t decreased alertness  Stairs            Wheelchair Mobility    Modified Rankin (Stroke Patients Only)       Balance       Sitting balance - Comments: unable to assess as pt not alert in order to actively participate       Standing balance comment: unable to assess as pt not alert in order to actively participate                             Pertinent Vitals/Pain Pain Assessment: No/denies pain    Home Living Family/patient expects to be discharged to:: Private residence Living Arrangements: Children;Spouse/significant other Available Help at Discharge: Family;Available 24 hours/day;Personal care attendant Type of Home: House Home Access: Ramped entrance     Home Layout: Multi-level;Able to live on main level with bedroom/bathroom Home Equipment: Walker - 4 wheels;Cane - single point;Bedside commode;Shower seat -  built in;Wheelchair - manual;Walker - standard;Other (comment) Additional Comments: Pt has BSC over toilet.  Pt also has Hoyer lift that has not been used yet.    Prior Function Level of Independence: Needs assistance   Gait / Transfers Assistance Needed: Pt's family or caretaker assists pt when he performs household mobility with SW  ADL's / Homemaking Assistance Needed: Patient required assistance with all BADLs.  Pt able to ambulate to toilet and dress with assistance, is otherwise largely dependent for all ADLs including  feeding, grooming, and bathing.  Pt's caretakers provide assistance with household management, meal prep, and medications.  Comments: Chronic macular degeneration     Hand Dominance   Dominant Hand: Right    Extremity/Trunk Assessment   Upper Extremity Assessment Upper Extremity Assessment: Overall WFL for tasks assessed;Generalized weakness (4- to 4/5 grossly bilaterally)    Lower Extremity Assessment Lower Extremity Assessment: Generalized weakness (grossly 3+ to 4-/5 bilaterally; L dorsiflexion was 2+)       Communication   Communication: No difficulties  Cognition Arousal/Alertness: Lethargic Behavior During Therapy: WFL for tasks assessed/performed;Flat affect Overall Cognitive Status: Difficult to assess                                 General Comments: Pt difficult to understand due to mumbling: pt stated name and unable to state correct birthday, stated "hospital" as location and date as 4/26. Pt remained lethargic throughout evaluation keeping his eyes closed most of the time despite requests to keep them open. Pt was able to follow most one-step commands.      General Comments      Exercises Other Exercises Other Exercises: pt able to perform active R foot AP, noted only great toe extension on L foot and required max A for to perform ankle dorsi/plantarflexion x 10; max A for hip ab/add and SLR bilaterally x 10; noted active attempt to assist with SLR   Assessment/Plan    PT Assessment Patient needs continued PT services  PT Problem List Decreased strength;Decreased activity tolerance;Decreased balance;Decreased mobility;Decreased cognition       PT Treatment Interventions Functional mobility training;Therapeutic activities;Therapeutic exercise;Balance training;Gait training;DME instruction;Neuromuscular re-education;Cognitive remediation;Patient/family education    PT Goals (Current goals can be found in the Care Plan section)  Acute Rehab PT  Goals Patient Stated Goal: pt unable to state goal PT Goal Formulation: Patient unable to participate in goal setting Time For Goal Achievement: 02/21/20 Potential to Achieve Goals: Good Additional Goals Additional Goal #1: Pt will require no more than min A to perform bed mobility including rolling side to side and supine <> sit; with verbal cues for sequencing. (to maximize functional mobility)    Frequency Other (Comment) (trial of 3 sessions to see if participatory)   Barriers to discharge        Co-evaluation               AM-PAC PT "6 Clicks" Mobility  Outcome Measure Help needed turning from your back to your side while in a flat bed without using bedrails?: A Lot Help needed moving from lying on your back to sitting on the side of a flat bed without using bedrails?: A Lot Help needed moving to and from a bed to a chair (including a wheelchair)?: A Lot Help needed standing up from a chair using your arms (e.g., wheelchair or bedside chair)?: A Lot Help needed to walk in hospital room?: A Lot  Help needed climbing 3-5 steps with a railing? : Total 6 Click Score: 11    End of Session   Activity Tolerance: Patient limited by lethargy Patient left: in bed;with call bell/phone within reach;with bed alarm set;Other (comment) (heel lift boots donned bilaterally) Nurse Communication: Mobility status PT Visit Diagnosis: Unsteadiness on feet (R26.81);Muscle weakness (generalized) (M62.81)    Time: 1947-1252 PT Time Calculation (min) (ACUTE ONLY): 13 min   Charges:   PT Evaluation $PT Eval Moderate Complexity: 1 Mod          Ayushi Pla, SPT  Naveen Clardy 02/07/2020, 3:18 PM

## 2020-02-07 NOTE — Care Management Important Message (Signed)
Important Message  Patient Details  Name: Blake [name] MRN: [id number] Date of Birth: [birth year]   Medicare Important Message Given:  Yes     [name] 02/07/2020, 1:32 PM

## 2020-02-07 NOTE — Progress Notes (Signed)
PROGRESS NOTE    RONRICO [name]  DUK:[id number] DOB: 07-04-1923 DOA: 01/23/2020 PCP: Adin Hector, MD  Brief Narrative:  HPI: Blake [name] is a 86 y.o. male with medical history significant of hypertension, hyperlipidemia, diabetes mellitus, GERD, OSA, prostate cancer, urothelial carcinoma of the bladder, s/p of bilateral nephrostomy, pacemaker placement due to SSS, GI bleeding, dCHF, SVT, CKD-IV, thrombocytopenia, chronic respiratory failure on 2 L nasal cannula oxygen at night at home, who presents with decreased urine output.  Per his son and daughter, pt has hx of urothelial carcinoma of the bladder and is s/p of bilateral nephrostomy. Pt was noted to have decreased urine output at home. Not sure if pt has symptoms of UTI.  Does not seem to have hematuria. Pt is using 2L oxygen at night at home, but not in the daytime.  Patient was found to have oxygen desaturated to 88% on room air, which improved to 95 to 96% on 2 L nasal cannula oxygen in ED. patient seems to have mild shortness breath, but no cough, chest pain.  He has left lower leg edema. No fever or chills.  Patient has little loose stool bowel movement recently, but no active diarrhea, nausea, vomiting, abdominal pain per his son. Per EDP, pt recently had cough and URI. Also reportedly recently finished antibiotics. Pt knows his own name, but not oriented to place and time.   7/28: Patient seen and examined.  Care assumed from previous hospitalist today.  Overall my evaluation patient is a little lethargic but otherwise in no acute distress.  He is scheduled for hemodialysis today.  He remains on 2 L nasal cannula.  7/29: Patient seen and examined.  Lethargic this morning.  Patient son is at bedside.  Upon prompting patient is able to open his eyes and does respond verbally to questioning.  Remains on 2 L nasal cannula.   Assessment & Plan:   Principal Problem:   Acute renal failure superimposed on stage 4 chronic kidney  disease (HCC) Active Problems:   SVT (supraventricular tachycardia) (HCC)   Atrial fibrillation with RVR (HCC)   HLD (hyperlipidemia)   Bleeding   Pressure injury, stage 3 (HCC)   Recurrent UTI   Urothelial carcinoma of bladder (HCC)   Thrombocytopenia (HCC)   Acute on chronic diastolic CHF (congestive heart failure) (HCC)   HTN (hypertension)   Type II diabetes mellitus with renal manifestations (HCC)   Acute respiratory failure with hypoxia (HCC)   Normocytic anemia   Protein-calorie malnutrition, severe   Pleural effusion   Pressure injury of buttock, stage 2 (HCC)   ESRD (end stage renal disease) (HCC)   Drainage from wound   Tachycardia   Weakness   Non-sustained ventricular tachycardia (HCC)   AF (paroxysmal atrial fibrillation) (HCC)  End-stage renal disease on hemodialysis Bilateral nephrostomy tubes Patient has permacath in place Patient was able to tolerate dialysis well sitting up No bleeding noted from PermCath site Per nephrology patient does have an outpatient HD chair Outpatient HD cannot start till next Tuesday Plan: No plans for dialysis today per nephrology Will evaluate for HD needs on a daily basis Plan to discharge home with home health this weekend and patient can initiate outpatient HD  Bilateral pleural effusions Status post right thoracentesis on 01/30/2020, 2400 cc fluid removed Status post left thoracentesis 02/01/2020, 750 cc removed  Paroxysmal atrial fibrillation Supraventricular tachycardia NSVT Heart rate stable over interval Continue metoprolol twice daily  Acute hypoxic respiratory failure Currently on 2  L oxygen Dialysis as above Wean oxygen as tolerated  Acute on chronic diastolic congestive heart failure HD for fluid removal  Thrombocytopenia Platelet count responded to platelet transfusion No pericatheter bleeding noted  Chronic decubitus ulcer Present on admission Stage II on sacrum WC consult  Weakness Functional  decline PT evaluation ordered  Right nephrostomy tube drainage Evaluated by IR Tube not exchanged Was empirically started on vancomycin and Zosyn De-escalate vancomycin and Augmentin Completed 7-day course of antibiotics No further need    DVT prophylaxis: SCD Code Status: Full Family Communication: Son at bedside, daughter via phone Disposition Plan: Status is: Inpatient  Remains inpatient appropriate because:Inpatient level of care appropriate due to severity of illness   Dispo: The patient is from: Home              Anticipated d/c is to: Home              Anticipated d/c date is: 3 days              Patient currently is not medically stable to d/c.  Patient still with respiratory failure.  New start hemodialysis.  Suspect that with 1 additional hemodialysis treatment we may be able to titrate off supplemental oxygen entirely.  Plan to discharge home with home health in the next 1 to 2 days.       Consultants:   Nephrology  Procedures:   Permacath  Antimicrobials:   Vancomycin  Augmentin   Subjective: Patient seen and examined.  Chief complaint of fatigue this morning.  No pain complaints  Objective: Vitals:   02/07/20 0232 02/07/20 0257 02/07/20 1029 02/07/20 1044  BP:  118/80 (!) 85/48 106/69  Pulse:  100 76   Resp:  18 20   Temp:  97.8 F (36.6 C) 98.5 F (36.9 C)   TempSrc:  Oral Oral   SpO2: 99% 96% 97%   Weight:      Height:        Intake/Output Summary (Last 24 hours) at 02/07/2020 1321 Last data filed at 02/07/2020 0900 Gross per 24 hour  Intake 600 ml  Output 1138 ml  Net -538 ml   Filed Weights   02/02/20 0544 02/04/20 0500 02/06/20 0458  Weight: (!) 92.5 kg (!) 92.2 kg (!) 95.7 kg    Examination:  General: No acute distress.  Appears stated age 53: Normocephalic, atraumatic, missing bottom teeth Neck, supple, trachea midline, no tenderness Heart: Tachycardic, regular rhythm, no murmurs Lungs: Bibasilar crackles, normal  work of breathing Abdomen: Soft, nontender, nondistended, positive bowel sounds Extremities: Normal, atraumatic, no clubbing or cyanosis, normal muscle tone Skin: No rashes or lesions, normal color Neurologic: Cranial nerves grossly intact, sensation intact, lethargic, oriented to person Psychiatric: Flattened affect    Data Reviewed: I have personally reviewed following labs and imaging studies  CBC: Recent Labs  Lab 02/01/20 1048 02/03/20 0652 02/05/20 0427 02/06/20 1053 02/07/20 0540  WBC 7.9 6.3 6.3 5.7 5.3  NEUTROABS  --   --   --  4.5 4.1  HGB 9.3* 8.7* 8.8* 8.5* 8.2*  HCT 29.4* 26.6* 26.9* 26.3* 25.2*  MCV 90.2 88.4 88.5 88.3 88.4  PLT 53* 58* 106* 96* 83*   Basic Metabolic Panel: Recent Labs  Lab 02/01/20 1048 02/03/20 0652 02/05/20 0427 02/06/20 1053 02/07/20 0540  NA 137 135 138 136 139  K 3.3* 4.2 4.4 4.2 4.3  CL 97* 94* 96* 94* 96*  CO2 33* 27 29 29 31   GLUCOSE 146* 120* 163*  179* 130*  BUN 34* 69* 85* 51* 35*  CREATININE 3.65* 6.97* 8.32* 5.77* 4.59*  CALCIUM 7.8* 7.8* 7.8* 7.7* 7.7*  PHOS 2.7  --   --   --   --    GFR: Estimated Creatinine Clearance: 11.5 mL/min (A) (by C-G formula based on SCr of 4.59 mg/dL (H)). Liver Function Tests: Recent Labs  Lab 02/01/20 1048  ALBUMIN 2.5*   No results for input(s): LIPASE, AMYLASE in the last 168 hours. No results for input(s): AMMONIA in the last 168 hours. Coagulation Profile: No results for input(s): INR, PROTIME in the last 168 hours. Cardiac Enzymes: No results for input(s): CKTOTAL, CKMB, CKMBINDEX, TROPONINI in the last 168 hours. BNP (last 3 results) No results for input(s): PROBNP in the last 8760 hours. HbA1C: No results for input(s): HGBA1C in the last 72 hours. CBG: Recent Labs  Lab 02/06/20 1442 02/06/20 1647 02/06/20 2059 02/07/20 0749 02/07/20 1226  GLUCAP 132* 133* 172* 110* 155*   Lipid Profile: No results for input(s): CHOL, HDL, LDLCALC, TRIG, CHOLHDL, LDLDIRECT in the  last 72 hours. Thyroid Function Tests: No results for input(s): TSH, T4TOTAL, FREET4, T3FREE, THYROIDAB in the last 72 hours. Anemia Panel: No results for input(s): VITAMINB12, FOLATE, FERRITIN, TIBC, IRON, RETICCTPCT in the last 72 hours. Sepsis Labs: No results for input(s): PROCALCITON, LATICACIDVEN in the last 168 hours.  Recent Results (from the past 240 hour(s))  Body fluid culture     Status: None   Collection Time: 01/30/20  2:40 PM   Specimen: PATH Cytology Pleural fluid  Result Value Ref Range Status   Specimen Description   Final    PLEURAL Performed at [facility], [address] [place], [place] [address]    Special Requests   Final    NONE Performed at [facility], [address] [place], [place] [address]    Gram Stain   Final    RARE WBC PRESENT, PREDOMINANTLY MONONUCLEAR NO ORGANISMS SEEN    Culture   Final    NO GROWTH 3 DAYS Performed at [facility] [address], [place], [place] [address]    Report Status 02/03/2020 FINAL  Final  Urine Culture     Status: Abnormal   Collection Time: 01/31/20 10:30 AM   Specimen: Urine, Random  Result Value Ref Range Status   Specimen Description   Final    URINE, RANDOM Performed at [facility], [address] [place], [place] [address]    Special Requests   Final    Normal Performed at [facility], [address] [place], [place] [address]    Culture MULTIPLE SPECIES PRESENT, SUGGEST RECOLLECTION (A)  Final   Report Status 02/01/2020 FINAL  Final  Body fluid culture     Status: None   Collection Time: 02/01/20  2:00 PM   Specimen: PATH Cytology Pleural fluid  Result Value Ref Range Status   Specimen Description   Final    PLEURAL Performed at [facility], [address], [place], [place] [address]    Special Requests   Final    NONE Performed at [facility], [place], [place] [address]    Gram Stain NO WBC  SEEN NO ORGANISMS SEEN   Final   Culture   Final    NO GROWTH 3 DAYS Performed at [facility], [place] [address] [place], [place] [address]    Report Status 02/05/2020 FINAL  Final  MRSA  PCR Screening     Status: Abnormal   Collection Time: 02/06/20 10:02 AM   Specimen: Nasopharyngeal  Result Value Ref Range Status   MRSA by PCR POSITIVE (A) NEGATIVE Final    Comment:        The GeneXpert MRSA Assay (FDA approved for NASAL specimens only), is one component of a comprehensive MRSA colonization surveillance program. It is not intended to diagnose MRSA infection nor to guide or monitor treatment for MRSA infections. RESULT CALLED TO, READ BACK BY AND VERIFIED WITH: MORALES JORGE AT 5277 ON 02/06/2020 BY Epifanio Lesches S Performed at [facility], [address], [place], [place] [address]          Radiology Studies: No results found.      Scheduled Meds: . ferrous sulfate  325 mg Oral Q breakfast   And  . vitamin C  250 mg Oral Q breakfast  . azelastine  1 spray Each Nare BID  . Chlorhexidine Gluconate Cloth  6 each Topical Q0600  . epoetin (EPOGEN/PROCRIT) injection  10,000 Units Intravenous Q T,Th,Sa-HD  . feeding supplement (ENSURE ENLIVE)  237 mL Oral TID BM  . finasteride  5 mg Oral Daily  . insulin aspart  0-5 Units Subcutaneous QHS  . insulin aspart  0-9 Units Subcutaneous TID WC  . ipratropium-albuterol  3 mL Nebulization Q6H  . mouth rinse  15 mL Mouth Rinse BID  . metoprolol tartrate  50 mg Oral BID  . midodrine  5 mg Oral TID WC  . multivitamin  1 tablet Oral QHS  . mupirocin ointment  1 application Nasal BID  . pantoprazole  40 mg Oral Daily  . rosuvastatin  5 mg Oral QHS  . sodium bicarbonate  650 mg Oral BID  . tamsulosin  0.4 mg Oral Daily   Continuous Infusions: . sodium chloride Stopped (02/04/20 1839)     LOS: 15 days    Time spent: 25 minutes    Sidney Ace, MD [facility]   If 7PM-7AM, please contact  night-coverage 02/07/2020, 1:21 PM

## 2020-02-07 NOTE — Progress Notes (Addendum)
Order for HD tomorrow (7/30) in dialysis chair.

## 2020-02-07 NOTE — TOC Progression Note (Addendum)
Transition of Care [facility]) - Progression Note    Patient Details  Name: Blake [name] MRN: [id number] Date of Birth: Jun 22, 1923  Transition of Care [facility]) CM/SW Contact  Beverly Sessions, RN Phone Number: 02/07/2020, 4:27 PM  Clinical Narrative:    Received notification from [facility] Liaison that schedule is as follows TTS 11:[address] [facility] [address]  Daughter notified Estill Bamberg that she would like to speak with about discharge planning   RNCM contact Daughter Dr Primus Bravo.  She states that she feels like all of this is coming on to quickly and that things are not in order for discharge.   She states we still don't have a ADA approved ramp, HD transport, or a hospital bed in place  I notified her that an order was place for a hospital per our discussion last week, and that he would be delivered on day of discharge. Per MD potential discharge tomorrow.  Will have to wait on confirmation of discharge to arrange delivery of hospital bed  For the ramp and the HD transportation I referred back to our previous conversation.  There is a functional ramp at the home however it is not ADA approved.  She has already been in discussion with [facility] transport, but at that time did not want to utilize their services due to their ramp requirements, and their requirements that she trim back her crepe myrtle trees. At time via email I provided her with the [email or web address] for ramp resources, as well at the name of private pay transport [place].  She states that the link I provided her did not work for [facility].  My email stated "If you have any additional questions please let me know", I had also provided her with my direct contact number and was not notified that she had any issues with the link.    Dr Primus Bravo inquires if I will be arranging transport and assist with ramp. I again notified her that if she did not choose to use [facility] it would be her and the familles responsibility to arrange  for private pay transport.  I let her know that once the patient is established at the HD center the SW can assist with additional transport support if needed.  At this time I also offered to provide her with the [facility] non emergency transport contact information.  She states "but that cost money too"  She inquires "what am I supposed to do until all of this is arranged".  I did let her know that PT was able to work with the patient and recommends SNF.  That if they would like to I can do a bed search to see what facilities are available, he could receive therapy, and in the mean time they could secure transport and a new ramp.  She discussed with her brother and they decline bed search and want him to return home with [facility] at [facility].  I let her know I would get resumption of home health order when time.   She requests for me to provide recommendation on someone to build a ramp.  I notified her that I was unable to make a personal recommendation on a company.  She states that she has spoken with companies that state they can have one out next day, but they are not local companies, and she prefers to stay local.    I ask Dr Primus Bravo is she has any other questions at this time.  She states "ill call  you back".  At this time I have not received a return call    Expected Discharge Plan: [place] Barriers to Discharge: Continued Medical Work up  Expected Discharge Plan and Services Expected Discharge Plan: [place] arrangements for the past 2 months: Single Family Home                                       Social Determinants of Health (SDOH) Interventions    Readmission Risk Interventions Readmission Risk Prevention Plan 01/25/2020 09/25/2019 12/30/2018  Transportation Screening Complete Complete Complete  PCP or Specialist Appt within 3-5 Days - Complete -  HRI or [place] - Complete -  Social Work Consult for [place]  Planning/Counseling - Complete -  Palliative Care Screening - Not Applicable -  Medication Review Press photographer) Complete Referral to Pharmacy Complete  HRI or [place] Complete - [place] Patient Refused - -  Some recent data might be hidden

## 2020-02-08 ENCOUNTER — Other Ambulatory Visit: Payer: Medicare PPO | Admitting: Adult Health Nurse Practitioner

## 2020-02-08 ENCOUNTER — Inpatient Hospital Stay: Payer: Medicare PPO

## 2020-02-08 DIAGNOSIS — I5033 Acute on chronic diastolic (congestive) heart failure: Secondary | ICD-10-CM | POA: Diagnosis not present

## 2020-02-08 DIAGNOSIS — I480 Paroxysmal atrial fibrillation: Secondary | ICD-10-CM | POA: Diagnosis not present

## 2020-02-08 DIAGNOSIS — J9601 Acute respiratory failure with hypoxia: Secondary | ICD-10-CM | POA: Diagnosis not present

## 2020-02-08 DIAGNOSIS — N179 Acute kidney failure, unspecified: Secondary | ICD-10-CM | POA: Diagnosis not present

## 2020-02-08 LAB — BASIC METABOLIC PANEL
Anion gap: 11 (ref 5–15)
BUN: 43 mg/dL — ABNORMAL HIGH (ref 8–23)
CO2: 29 mmol/L (ref 22–32)
Calcium: 7.9 mg/dL — ABNORMAL LOW (ref 8.9–10.3)
Chloride: 98 mmol/L (ref 98–111)
Creatinine, Ser: 5.53 mg/dL — ABNORMAL HIGH (ref 0.61–1.24)
GFR calc Af Amer: 9 mL/min — ABNORMAL LOW (ref >60)
GFR calc non Af Amer: 8 mL/min — ABNORMAL LOW (ref >60)
Glucose, Bld: 126 mg/dL — ABNORMAL HIGH (ref 70–99)
Potassium: 4.9 mmol/L (ref 3.5–5.1)
Sodium: 138 mmol/L (ref 135–145)

## 2020-02-08 LAB — CBC WITH DIFFERENTIAL/PLATELET
Abs Immature Granulocytes: 0.04 10*3/uL (ref 0.00–0.07)
Basophils Absolute: 0 10*3/uL (ref 0.0–0.1)
Basophils Relative: 1 %
Eosinophils Absolute: 0.1 10*3/uL (ref 0.0–0.5)
Eosinophils Relative: 2 %
HCT: 26.2 % — ABNORMAL LOW (ref 39.0–52.0)
Hemoglobin: 8.1 g/dL — ABNORMAL LOW (ref 13.0–17.0)
Immature Granulocytes: 1 %
Lymphocytes Relative: 10 %
Lymphs Abs: 0.6 10*3/uL — ABNORMAL LOW (ref 0.7–4.0)
MCH: 28.3 pg (ref 26.0–34.0)
MCHC: 30.9 g/dL (ref 30.0–36.0)
MCV: 91.6 fL (ref 80.0–100.0)
Monocytes Absolute: 0.6 10*3/uL (ref 0.1–1.0)
Monocytes Relative: 10 %
Neutro Abs: 4.5 10*3/uL (ref 1.7–7.7)
Neutrophils Relative %: 76 %
Platelets: 84 10*3/uL — ABNORMAL LOW (ref 150–400)
RBC: 2.86 MIL/uL — ABNORMAL LOW (ref 4.22–5.81)
RDW: 17.5 % — ABNORMAL HIGH (ref 11.5–15.5)
WBC: 5.8 10*3/uL (ref 4.0–10.5)
nRBC: 0 % (ref 0.0–0.2)

## 2020-02-08 LAB — GLUCOSE, CAPILLARY
Glucose-Capillary: 120 mg/dL — ABNORMAL HIGH (ref 70–99)
Glucose-Capillary: 94 mg/dL (ref 70–99)
Glucose-Capillary: 149 mg/dL — ABNORMAL HIGH (ref 70–99)
Glucose-Capillary: 184 mg/dL — ABNORMAL HIGH (ref 70–99)

## 2020-02-08 MED ORDER — IPRATROPIUM-ALBUTEROL 0.5-2.5 (3) MG/3ML IN SOLN: 3.0000 mL | Freq: Four times a day (QID) | RESPIRATORY_TRACT | Status: DC | PRN

## 2020-02-08 MED ORDER — ACETAMINOPHEN 325 MG PO TABS: 650.0000 mg | ORAL_TABLET | Freq: Four times a day (QID) | ORAL | Status: DC | PRN

## 2020-02-08 MED ORDER — IPRATROPIUM-ALBUTEROL 0.5-2.5 (3) MG/3ML IN SOLN
3.0000 mL | Freq: Three times a day (TID) | RESPIRATORY_TRACT | Status: DC
  Administered 2020-02-08 – 2020-02-09 (×5): 3 mL via RESPIRATORY_TRACT
  Filled 2020-02-08 (×5): qty 3

## 2020-02-08 NOTE — TOC Progression Note (Addendum)
Transition of Care [facility]) - Progression Note    Patient Details  Name: ZAQUAN DUFFNER MRN: [id number] Date of Birth: 1922/12/17  Transition of Care [facility]) CM/SW Contact  Beverly Sessions, RN Phone Number: 02/08/2020, 10:23 AM  Clinical Narrative:     Received return call daughter Dr Primus Bravo.  She states that they have decided to purse SNF.    She is in agreement to bed search.   Her first 2 choices would be:  [facility] and [facility].   She is not interested in [facility].   And her last choice would be [facility]   Existing PASRR Fl2 sent for signature Bed search initiated  [facility] updated on plan of care    Update:  Bed offers presented.  [facility] accepted by Daughter.   Accepted in the [place].  Magda Paganini at [facility].  TOC started insurance auth initiated in [place] portable  Expected Discharge Plan: [place] Barriers to Discharge: Continued Medical Work up  Expected Discharge Plan and Services Expected Discharge Plan: [place] arrangements for the past 2 months: Single Family Home                                       Social Determinants of Health (SDOH) Interventions    Readmission Risk Interventions Readmission Risk Prevention Plan 01/25/2020 09/25/2019 12/30/2018  Transportation Screening Complete Complete Complete  PCP or Specialist Appt within 3-5 Days - Complete -  HRI or [place] - Complete -  Social Work Consult for [place] Planning/Counseling - Complete -  Palliative Care Screening - Not Applicable -  Medication Review Press photographer) Complete Referral to Pharmacy Complete  HRI or [place] Complete - [place] Patient Refused - -  Some recent data might be hidden

## 2020-02-08 NOTE — Progress Notes (Signed)
PROGRESS NOTE    Blake [name]  UYQ:[id number] DOB: July 04, 1923 DOA: 01/23/2020 PCP: Adin Hector, MD  Brief Narrative:  HPI: Blake [name] is a 86 y.o. male with medical history significant of hypertension, hyperlipidemia, diabetes mellitus, GERD, OSA, prostate cancer, urothelial carcinoma of the bladder, s/p of bilateral nephrostomy, pacemaker placement due to SSS, GI bleeding, dCHF, SVT, CKD-IV, thrombocytopenia, chronic respiratory failure on 2 L nasal cannula oxygen at night at home, who presents with decreased urine output.  Per his son and daughter, pt has hx of urothelial carcinoma of the bladder and is s/p of bilateral nephrostomy. Pt was noted to have decreased urine output at home. Not sure if pt has symptoms of UTI.  Does not seem to have hematuria. Pt is using 2L oxygen at night at home, but not in the daytime.  Patient was found to have oxygen desaturated to 88% on room air, which improved to 95 to 96% on 2 L nasal cannula oxygen in ED. patient seems to have mild shortness breath, but no cough, chest pain.  He has left lower leg edema. No fever or chills.  Patient has little loose stool bowel movement recently, but no active diarrhea, nausea, vomiting, abdominal pain per his son. Per EDP, pt recently had cough and URI. Also reportedly recently finished antibiotics. Pt knows his own name, but not oriented to place and time.   7/28: Patient seen and examined.  Care assumed from previous hospitalist today.  Overall my evaluation patient is a little lethargic but otherwise in no acute distress.  He is scheduled for hemodialysis today.  He remains on 2 L nasal cannula.  7/29: Patient seen and examined.  Lethargic this morning.  Patient son is at bedside.  Upon prompting patient is able to open his eyes and does respond verbally to questioning.  Remains on 2 L nasal cannula.  7/30: Patient seen and examined.  Completed hemodialysis this morning.  Lethargic on my arrival but does  awaken.  Rather difficult to understand without bottom teeth and.  Remains on 2 L nasal cannula.  Does not appear in any distress.   Assessment & Plan:   Principal Problem:   Acute renal failure superimposed on stage 4 chronic kidney disease (HCC) Active Problems:   SVT (supraventricular tachycardia) (HCC)   Atrial fibrillation with RVR (HCC)   HLD (hyperlipidemia)   Bleeding   Pressure injury, stage 3 (HCC)   Recurrent UTI   Urothelial carcinoma of bladder (HCC)   Thrombocytopenia (HCC)   Acute on chronic diastolic CHF (congestive heart failure) (HCC)   HTN (hypertension)   Type II diabetes mellitus with renal manifestations (HCC)   Acute respiratory failure with hypoxia (HCC)   Normocytic anemia   Protein-calorie malnutrition, severe   Pleural effusion   Pressure injury of buttock, stage 2 (HCC)   ESRD (end stage renal disease) (HCC)   Drainage from wound   Tachycardia   Weakness   Non-sustained ventricular tachycardia (HCC)   AF (paroxysmal atrial fibrillation) (HCC)  End-stage renal disease on hemodialysis Bilateral nephrostomy tubes Patient has permacath in place Patient was able to tolerate dialysis well sitting up No bleeding noted from PermCath site Per nephrology patient does have an outpatient HD chair Outpatient HD cannot start till next Tuesday 02/12/20 Plan: Inpatient hemodialysis per nephrology We will monitor overnight Plan to dispo to skilled nursing facility once a bed is found  Bilateral pleural effusions Status post right thoracentesis on 01/30/2020, 2400 cc fluid removed  Status post left thoracentesis 02/01/2020, 750 cc removed  Paroxysmal atrial fibrillation Supraventricular tachycardia NSVT Heart rate stable over interval Continue metoprolol twice daily  Acute hypoxic respiratory failure Currently on 2 L oxygen Dialysis as above Wean oxygen as tolerated  Acute on chronic diastolic congestive heart failure HD for fluid  removal  Thrombocytopenia Platelet count responded to platelet transfusion No pericatheter bleeding noted  Chronic decubitus ulcer Present on admission Stage II on sacrum WC consult  Weakness Functional decline PT evaluation ordered  Right nephrostomy tube drainage Evaluated by IR Tube not exchanged Was empirically started on vancomycin and Zosyn De-escalate vancomycin and Augmentin Completed 7-day course of antibiotics No further need    DVT prophylaxis: SCD Code Status: Full Family Communication: Daughter via phone Disposition Plan: Status is: Inpatient  Remains inpatient appropriate because:Inpatient level of care appropriate due to severity of illness   Dispo: The patient is from: Home              Anticipated d/c is to: Home              Anticipated d/c date is: 3 days              Patient currently is not medically stable to d/c.  Patient still dependent on 2 L nasal cannula.  Suspect atelectasis.  Also possibility of persistent fluid retention.  Will obtain chest x-ray.  Per case management the family is now interested in pursuing skilled nursing facility placement post discharge.  Bed search in progress.  FL2 signed    Consultants:   Nephrology  Procedures:   Permacath  Antimicrobials:   Vancomycin, completed  Augmentin, completed   Subjective: Patient seen and examined.  Chief complaint of fatigue this morning.  No pain complaints  Objective: Vitals:   02/08/20 1115 02/08/20 1130 02/08/20 1145 02/08/20 1226  BP: (!) 118/87 (!) 117/92 (!) 123/96 (!) 119/89  Pulse: (!) 134 (!) 132 (!) 133 78  Resp: 23 23 (!) 24 23  Temp:   97.7 F (36.5 C) 97.7 F (36.5 C)  TempSrc:   Oral Oral  SpO2:   100% 100%  Weight:      Height:        Intake/Output Summary (Last 24 hours) at 02/08/2020 1303 Last data filed at 02/08/2020 1145 Gross per 24 hour  Intake 0 ml  Output 58 ml  Net -58 ml   Filed Weights   02/02/20 0544 02/04/20 0500 02/06/20 0458   Weight: (!) 92.5 kg (!) 92.2 kg (!) 95.7 kg    Examination:  General: Appears stated age, appears chronically HEENT: Normocephalic, atraumatic poor dentition Neck, supple, trachea midline, no tenderness Heart: Regular rate and rhythm, S1/S2 normal, no murmurs Lungs: Coarse breath sounds bilaterally, normal work of breathing Abdomen: Soft, nontender, nondistended, positive bowel sounds Extremities: Normal, atraumatic, no clubbing or cyanosis, normal muscle tone Skin: No rashes or lesions, normal color Neurologic: Cranial nerves grossly intact, sensation intact, alert and oriented x3 Psychiatric: Flat affect     Data Reviewed: I have personally reviewed following labs and imaging studies  CBC: Recent Labs  Lab 02/03/20 0652 02/05/20 0427 02/06/20 1053 02/07/20 0540 02/08/20 0519  WBC 6.3 6.3 5.7 5.3 5.8  NEUTROABS  --   --  4.5 4.1 4.5  HGB 8.7* 8.8* 8.5* 8.2* 8.1*  HCT 26.6* 26.9* 26.3* 25.2* 26.2*  MCV 88.4 88.5 88.3 88.4 91.6  PLT 58* 106* 96* 83* 84*   Basic Metabolic Panel: Recent Labs  Lab 02/03/20  1062 02/05/20 0427 02/06/20 1053 02/07/20 0540 02/08/20 0519  NA 135 138 136 139 138  K 4.2 4.4 4.2 4.3 4.9  CL 94* 96* 94* 96* 98  CO2 27 29 29 31 29   GLUCOSE 120* 163* 179* 130* 126*  BUN 69* 85* 51* 35* 43*  CREATININE 6.97* 8.32* 5.77* 4.59* 5.53*  CALCIUM 7.8* 7.8* 7.7* 7.7* 7.9*   GFR: Estimated Creatinine Clearance: 9.6 mL/min (A) (by C-G formula based on SCr of 5.53 mg/dL (H)). Liver Function Tests: No results for input(s): AST, ALT, ALKPHOS, BILITOT, PROT, ALBUMIN in the last 168 hours. No results for input(s): LIPASE, AMYLASE in the last 168 hours. No results for input(s): AMMONIA in the last 168 hours. Coagulation Profile: No results for input(s): INR, PROTIME in the last 168 hours. Cardiac Enzymes: No results for input(s): CKTOTAL, CKMB, CKMBINDEX, TROPONINI in the last 168 hours. BNP (last 3 results) No results for input(s): PROBNP in the  last 8760 hours. HbA1C: No results for input(s): HGBA1C in the last 72 hours. CBG: Recent Labs  Lab 02/07/20 1226 02/07/20 1649 02/07/20 2110 02/08/20 0752 02/08/20 1257  GLUCAP 155* 167* 144* 120* 94   Lipid Profile: No results for input(s): CHOL, HDL, LDLCALC, TRIG, CHOLHDL, LDLDIRECT in the last 72 hours. Thyroid Function Tests: No results for input(s): TSH, T4TOTAL, FREET4, T3FREE, THYROIDAB in the last 72 hours. Anemia Panel: No results for input(s): VITAMINB12, FOLATE, FERRITIN, TIBC, IRON, RETICCTPCT in the last 72 hours. Sepsis Labs: No results for input(s): PROCALCITON, LATICACIDVEN in the last 168 hours.  Recent Results (from the past 240 hour(s))  Body fluid culture     Status: None   Collection Time: 01/30/20  2:40 PM   Specimen: PATH Cytology Pleural fluid  Result Value Ref Range Status   Specimen Description   Final    PLEURAL Performed at [facility], [address] [place], [place] [address]    Special Requests   Final    NONE Performed at [facility], [address] [place], [place] [address]    Gram Stain   Final    RARE WBC PRESENT, PREDOMINANTLY MONONUCLEAR NO ORGANISMS SEEN    Culture   Final    NO GROWTH 3 DAYS Performed at [facility] [address] [place], [place] [address]    Report Status 02/03/2020 FINAL  Final  Urine Culture     Status: Abnormal   Collection Time: 01/31/20 10:30 AM   Specimen: Urine, Random  Result Value Ref Range Status   Specimen Description   Final    URINE, RANDOM Performed at [facility], [address], [place], [place] [address]    Special Requests   Final    Normal Performed at [facility], [address] [place], [place] [address]    Culture MULTIPLE SPECIES PRESENT, SUGGEST RECOLLECTION (A)  Final   Report Status 02/01/2020 FINAL  Final  Body fluid culture     Status: None   Collection Time: 02/01/20  2:00 PM   Specimen: PATH Cytology Pleural  fluid  Result Value Ref Range Status   Specimen Description   Final    PLEURAL Performed at [facility], [address] [place], [place] [address]    Special Requests   Final    NONE Performed at [facility], [address], [place], [place] [address]    Gram Stain NO WBC SEEN NO ORGANISMS SEEN   Final   Culture   Final  NO GROWTH 3 DAYS Performed at [facility], [place] [address], [place], [place] [address]    Report Status 02/05/2020 FINAL  Final  MRSA PCR Screening     Status: Abnormal   Collection Time: 02/06/20 10:02 AM   Specimen: Nasopharyngeal  Result Value Ref Range Status   MRSA by PCR POSITIVE (A) NEGATIVE Final    Comment:        The GeneXpert MRSA Assay (FDA approved for NASAL specimens only), is one component of a comprehensive MRSA colonization surveillance program. It is not intended to diagnose MRSA infection nor to guide or monitor treatment for MRSA infections. RESULT CALLED TO, READ BACK BY AND VERIFIED WITH: MORALES JORGE AT 1572 ON 02/06/2020 BY Epifanio Lesches S Performed at [facility], [address], [place], [place] [address]          Radiology Studies: No results found.      Scheduled Meds: . ferrous sulfate  325 mg Oral Q breakfast   And  . vitamin C  250 mg Oral Q breakfast  . azelastine  1 spray Each Nare BID  . Chlorhexidine Gluconate Cloth  6 each Topical Q0600  . epoetin (EPOGEN/PROCRIT) injection  10,000 Units Intravenous Q T,Th,Sa-HD  . feeding supplement (ENSURE ENLIVE)  237 mL Oral TID BM  . finasteride  5 mg Oral Daily  . insulin aspart  0-5 Units Subcutaneous QHS  . insulin aspart  0-9 Units Subcutaneous TID WC  . ipratropium-albuterol  3 mL Nebulization TID  . mouth rinse  15 mL Mouth Rinse BID  . metoprolol tartrate  50 mg Oral BID  . midodrine  5 mg Oral TID WC  . multivitamin  1 tablet Oral QHS  . mupirocin ointment  1 application Nasal BID  . pantoprazole  40 mg Oral Daily   . rosuvastatin  5 mg Oral QHS  . tamsulosin  0.4 mg Oral Daily   Continuous Infusions: . sodium chloride Stopped (02/04/20 1839)     LOS: 16 days    Time spent: 25 minutes    Sidney Ace, MD [facility]   If 7PM-7AM, please contact night-coverage 02/08/2020, 1:03 PM

## 2020-02-08 NOTE — Progress Notes (Signed)
OT Cancellation Note  Patient Details Name: Blake [name] MRN: [id number] DOB: [birth year]   Cancelled Treatment:    Reason Eval/Treat Not Completed: Patient at procedure or test/ unavailable. OT continues to follow pt for services. Attempted to see pt for tx session this date, however upon arrival to unit pt primary RN informed this OT that the pt is out of his room for dialysis. Will hold and re-attempt as available and pt medically appropriate.   Shara Blazing, M.S., OTR/L Ascom: [phone] 02/08/20, 10:04 AM

## 2020-02-08 NOTE — NC FL2 (Signed)
[place] LEVEL OF CARE SCREENING TOOL     IDENTIFICATION  Patient Name: Blake [name] Birthdate: 1922-12-03 Sex: male Admission Date (Current Location): 01/23/2020  [facility] and [place] Number:  Engineering geologist and Address:         Provider Number: [id number]  Attending Physician Name and Address:  Sidney Ace, MD  Relative Name and Phone Number:       Current Level of Care: Hospital Recommended Level of Care: [place] Prior Approval Number:    Date Approved/Denied:   PASRR Number: [id number]  Discharge Plan: SNF    Current Diagnoses: Patient Active Problem List   Diagnosis Date Noted  . AF (paroxysmal atrial fibrillation) ([place])   . Non-sustained ventricular tachycardia ([place])   . Drainage from wound   . Tachycardia   . Weakness   . ESRD (end stage renal disease) ([place])   . Protein-calorie malnutrition, severe 01/30/2020  . Pleural effusion   . Pressure injury of buttock, stage 2 ([place])   . HTN (hypertension) 01/23/2020  . Type II diabetes mellitus with renal manifestations ([place]) 01/23/2020  . Acute respiratory failure with hypoxia ([place]) 01/23/2020  . Normocytic anemia 01/23/2020  . Acute metabolic encephalopathy   . Urinary tract infection in elderly patient 11/04/2019  . AMS (altered mental status) 09/24/2019  . Acute renal failure superimposed on stage 4 chronic kidney disease ([place]) 09/24/2019  . Anemia 09/24/2019  . OSA (obstructive sleep apnea) 09/24/2019  . Seizure ([place]) 01/03/2019  . Acute on chronic diastolic CHF (congestive heart failure) ([place]) 12/25/2018  . Acute renal failure (ARF) ([place]) 11/21/2018  . AKI (acute kidney injury) ([place])   . Goals of care, counseling/discussion   . Palliative care by specialist   . DNR (do not resuscitate) discussion   . Seizure-like activity ([place]) 11/05/2018  . Allergic rhinitis 07/24/2018  . Cardiac pacemaker in situ 07/24/2018  . History of sleep apnea 07/24/2018  .  Macular degeneration 07/24/2018  . Trigeminal neuralgia pain 01/11/2018  . TMJ pain dysfunction syndrome 01/11/2018  . Recurrent UTI 09/26/2017  . Urothelial carcinoma of bladder ([place]) 09/26/2017  . Pressure injury, stage 3 ([place]) 09/12/2017  . SIRS (systemic inflammatory response syndrome) ([place]) 09/10/2017  . Chronic cystitis 08/25/2017  . Sepsis ([place]) 06/10/2017  . CAP (community acquired pneumonia) 06/10/2017  . Infection due to enterococcus 06/07/2017  . Bleeding   . Hemorrhage of rectum and anus   . Lower GI bleed 10/20/2016  . Near syncope   . Acute renal failure superimposed on chronic kidney disease ([place])   . Pneumonia 10/09/2016  . Aspiration pneumonia ([place]) 10/09/2016  . Acute respiratory failure ([place]) 10/09/2016  . Hydronephrosis, bilateral 07/19/2016  . Spinal stenosis of lumbar region with neurogenic claudication 07/19/2016  . AAA (abdominal aortic aneurysm) without rupture ([place]) 04/27/2016  . Arthritis, degenerative 03/31/2016  . Atrial fibrillation with RVR ([place]) 03/31/2016  . HLD (hyperlipidemia) 03/31/2016  . CA of prostate ([place]) 03/31/2016  . Central perforation of tympanic membrane of right ear 03/26/2016  . 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 Presbycusis of left ear with restricted hearing of right ear 03/01/2016  . Chronic kidney disease, stage 3 (moderate) 10/30/2015  . Episode of unresponsiveness 10/30/2015  . Sick sinus syndrome ([place]) 10/30/2015  . Thrombocytopenia ([place]) 07/24/2015  . Fitting or adjustment of cardiac pacemaker 05/09/2015  . SVT (supraventricular tachycardia) ([place]) 04/09/2015  . Diabetes mellitus, type  2 ([place]) 04/09/2015  . Benign hypertension 04/09/2015  . Acute myocardial infarction, initial episode of care ([place]) 04/09/2015  . Phimosis 01/30/2014  . Bladder outlet obstruction 04/18/2013  . Dysuria 04/18/2013  . Gross hematuria 04/18/2013  . Incomplete emptying  of bladder 11/26/2012  . Personal history of prostate cancer 11/26/2012    Orientation RESPIRATION BLADDER Height & Weight     Self  O2 (2L [place]) Continent Weight: (!) 95.7 kg Height:  6' (182.9 cm)  BEHAVIORAL SYMPTOMS/MOOD NEUROLOGICAL BOWEL NUTRITION STATUS      Incontinent Diet (dsyphagia 1)  AMBULATORY STATUS COMMUNICATION OF NEEDS Skin   Total Care Verbally PU Stage and Appropriate Care                       Personal Care Assistance Level of Assistance  Total care           Functional Limitations Info             SPECIAL CARE FACTORS FREQUENCY  OT (By licensed OT), Bowel and bladder program                    Contractures Contractures Info: Not present    Additional Factors Info  Code Status, Allergies (bilateral nephrostomy tube, HD at [facility] [address]) Code Status Info: Full Allergies Info: Cefepime, Diovan Gabapentin, Hydralazine, Lisinopril, Pregabalin, Levofloxacin, Sulfamethoxazole-trimethoprim           Current Medications (02/08/2020):  This is the current hospital active medication list Current Facility-Administered Medications  Medication Dose Route Frequency Provider Last Rate Last Admin  . 0.9 %  sodium chloride infusion   Intravenous PRN Algernon Huxley, MD   Stopped at 02/04/20 1839  . acetaminophen (TYLENOL) tablet 650 mg  650 mg Oral Q6H PRN Algernon Huxley, MD      . ammonium lactate (LAC-HYDRIN) 12 % lotion   Topical PRN Algernon Huxley, MD      . ferrous sulfate tablet 325 mg  325 mg Oral Q breakfast Algernon Huxley, MD   325 mg at 02/07/20 1046   And  . ascorbic acid (VITAMIN C) tablet 250 mg  250 mg Oral Q breakfast Algernon Huxley, MD   250 mg at 02/07/20 1047  . azelastine (ASTELIN) 0.1 % nasal spray 1 spray  1 spray Each Nare BID Algernon Huxley, MD   1 spray at 02/07/20 1048  . Chlorhexidine Gluconate Cloth 2 % PADS 6 each  6 each Topical Q0600 Algernon Huxley, MD   6 each at 02/08/20 0522  . epoetin alfa (EPOGEN) injection 10,000 Units  10,000  Units Intravenous Q T,Th,Sa-HD Algernon Huxley, MD   10,000 Units at 02/06/20 1302  . feeding supplement (ENSURE ENLIVE) (ENSURE ENLIVE) liquid 237 mL  237 mL Oral TID BM Ralene Muskrat B, MD   237 mL at 02/07/20 2235  . finasteride (PROSCAR) tablet 5 mg  5 mg Oral Daily Algernon Huxley, MD   5 mg at 02/07/20 1048  . HYDROmorphone (DILAUDID) injection 1 mg  1 mg Intravenous Once PRN Algernon Huxley, MD      . insulin aspart (novoLOG) injection 0-5 Units  0-5 Units Subcutaneous QHS Algernon Huxley, MD      . insulin aspart (novoLOG) injection 0-9 Units  0-9 Units Subcutaneous TID WC Algernon Huxley, MD   2 Units at 02/07/20 1730  . ipratropium-albuterol (DUONEB) 0.5-2.5 (3) MG/3ML nebulizer solution 3 mL  3 mL Nebulization TID Ralene Muskrat B, MD   3 mL at 02/08/20 0810  . ipratropium-albuterol (DUONEB) 0.5-2.5 (3) MG/3ML nebulizer solution 3 mL  3 mL Nebulization Q6H PRN Sreenath, Sudheer B, MD      . MEDLINE mouth rinse  15 mL Mouth Rinse BID Algernon Huxley, MD   15 mL at 02/07/20 2243  . metoprolol tartrate (LOPRESSOR) injection 10 mg  10 mg Intravenous Q4H PRN Algernon Huxley, MD      . metoprolol tartrate (LOPRESSOR) tablet 50 mg  50 mg Oral BID Algernon Huxley, MD   50 mg at 02/07/20 2234  . midodrine (PROAMATINE) tablet 5 mg  5 mg Oral TID WC Murlean Iba, MD   5 mg at 02/07/20 1729  . multivitamin (RENA-VIT) tablet 1 tablet  1 tablet Oral QHS Algernon Huxley, MD   1 tablet at 02/07/20 2234  . mupirocin ointment (BACTROBAN) 2 % 1 application  1 application Nasal BID Sidney Ace, MD   1 application at 49/20/10 2234  . ondansetron (ZOFRAN) injection 4 mg  4 mg Intravenous Q6H PRN Algernon Huxley, MD      . pantoprazole (PROTONIX) EC tablet 40 mg  40 mg Oral Daily Algernon Huxley, MD   40 mg at 02/07/20 1046  . rosuvastatin (CRESTOR) tablet 5 mg  5 mg Oral QHS Algernon Huxley, MD   5 mg at 02/07/20 2234  . tamsulosin (FLOMAX) capsule 0.4 mg  0.4 mg Oral Daily Algernon Huxley, MD   0.4 mg at 02/07/20 1047      Discharge Medications: Please see discharge summary for a list of discharge medications.  Relevant Imaging Results:  Relevant Lab Results:   Additional Information [id number]  Beverly Sessions, RN

## 2020-02-08 NOTE — Progress Notes (Signed)
[facility]  ROUNDING NOTE   Subjective:     HEMODIALYSIS FLOWSHEET:  Blood Flow Rate (mL/min): 200 mL/min Arterial Pressure (mmHg): -50 mmHg Venous Pressure (mmHg): 80 mmHg Transmembrane Pressure (mmHg): 30 mmHg Ultrafiltration Rate (mL/min): 170 mL/min Dialysate Flow Rate (mL/min): 600 ml/min Conductivity: Machine : 13.8 Conductivity: Machine : 13.8 Dialysis Fluid Bolus: Normal Saline Bolus Amount (mL): 250 mL  Tachycardia during HD Given 250 cc bolus iv x 2 Denies pain,  SOB   Objective:  Vital signs in last 24 hours:  Temp:  [97.6 F (36.4 C)-98.8 F (37.1 C)] 97.7 F (36.5 C) (07/30 1226) Pulse Rate:  [77-134] 78 (07/30 1226) Resp:  [16-25] 23 (07/30 1226) BP: (100-139)/(59-103) 119/89 (07/30 1226) SpO2:  [98 %-100 %] 99 % (07/30 1329)  Weight change:  Filed Weights   02/02/20 0544 02/04/20 0500 02/06/20 0458  Weight: (!) 92.5 kg (!) 92.2 kg (!) 95.7 kg    Intake/Output: I/O last 3 completed shifts: In: 600 [P.O.:600] Out: 58 [Urine:58]   Intake/Output this shift:  No intake/output data recorded.  Physical Exam: General: NAD, frail, elderly gentleman,   Head: Temporal wasting noted, edentulous  Eyes: Anicteric  Lungs:  +rhonchi bilaterally, normal effort, Black o2  Heart: irregular   Abdomen:  Soft, nontender,    Extremities: 1-2+ LE edema  Neurologic:  Resting quietly, answers few simple questions  Access:  Right IJ PermCath.  Placed July 25.    Basic Metabolic Panel: Recent Labs  Lab 02/03/20 0652 02/03/20 0652 02/05/20 0427 02/05/20 0427 02/06/20 1053 02/07/20 0540 02/08/20 0519  NA 135  --  138  --  136 139 138  K 4.2  --  4.4  --  4.2 4.3 4.9  CL 94*  --  96*  --  94* 96* 98  CO2 27  --  29  --  29 31 29   GLUCOSE 120*  --  163*  --  179* 130* 126*  BUN 69*  --  85*  --  51* 35* 43*  CREATININE 6.97*  --  8.32*  --  5.77* 4.59* 5.53*  CALCIUM 7.8*   < > 7.8*   < > 7.7* 7.7* 7.9*   < > = values in this interval not  displayed.    Liver Function Tests: No results for input(s): AST, ALT, ALKPHOS, BILITOT, PROT, ALBUMIN in the last 168 hours. No results for input(s): LIPASE, AMYLASE in the last 168 hours. No results for input(s): AMMONIA in the last 168 hours.  CBC: Recent Labs  Lab 02/03/20 0652 02/05/20 0427 02/06/20 1053 02/07/20 0540 02/08/20 0519  WBC 6.3 6.3 5.7 5.3 5.8  NEUTROABS  --   --  4.5 4.1 4.5  HGB 8.7* 8.8* 8.5* 8.2* 8.1*  HCT 26.6* 26.9* 26.3* 25.2* 26.2*  MCV 88.4 88.5 88.3 88.4 91.6  PLT 58* 106* 96* 83* 84*    Cardiac Enzymes: No results for input(s): CKTOTAL, CKMB, CKMBINDEX, TROPONINI in the last 168 hours.  BNP: Invalid input(s): POCBNP  CBG: Recent Labs  Lab 02/07/20 1226 02/07/20 1649 02/07/20 2110 02/08/20 0752 02/08/20 1257  GLUCAP 155* 167* 144* 120* 66    Microbiology: Results for orders placed or performed during the hospital encounter of 01/23/20  Urine culture     Status: Abnormal   Collection Time: 01/23/20  9:40 AM   Specimen: Urine, Random  Result Value Ref Range Status   Specimen Description   Final    URINE, RANDOM Performed at [facility], [address] [place], [place] [address]    Special Requests   Final    NONE Performed at [facility], [place], [place] [address]    Culture 70,000 COLONIES/mL STAPHYLOCOCCUS EPIDERMIDIS (A)  Final   Report Status 01/26/2020 FINAL  Final   Organism ID, Bacteria STAPHYLOCOCCUS EPIDERMIDIS (A)  Final      Susceptibility   Staphylococcus epidermidis - MIC*    CIPROFLOXACIN >=8 RESISTANT Resistant     GENTAMICIN <=0.5 SENSITIVE Sensitive     NITROFURANTOIN <=16 SENSITIVE Sensitive     OXACILLIN >=4 RESISTANT Resistant     TETRACYCLINE 2 SENSITIVE Sensitive     VANCOMYCIN 1 SENSITIVE Sensitive     TRIMETH/SULFA 80 RESISTANT Resistant     CLINDAMYCIN <=0.25 SENSITIVE Sensitive     RIFAMPIN <=0.5 SENSITIVE Sensitive     Inducible Clindamycin NEGATIVE  Sensitive     * 70,000 COLONIES/mL STAPHYLOCOCCUS EPIDERMIDIS  SARS Coronavirus 2 by RT PCR (hospital order, performed in [facility] hospital lab) Nasopharyngeal Nasopharyngeal Swab     Status: None   Collection Time: 01/23/20  4:00 PM   Specimen: Nasopharyngeal Swab  Result Value Ref Range Status   SARS Coronavirus 2 NEGATIVE NEGATIVE Final    Comment: (NOTE) SARS-CoV-2 target nucleic acids are NOT DETECTED.  The SARS-CoV-2 RNA is generally detectable in upper and lower respiratory specimens during the acute phase of infection. The lowest concentration of SARS-CoV-2 viral copies this assay can detect is 250 copies / mL. A negative result does not preclude SARS-CoV-2 infection and should not be used as the sole basis for treatment or other patient management decisions.  A negative result may occur with improper specimen collection / handling, submission of specimen other than nasopharyngeal swab, presence of viral mutation(s) within the areas targeted by this assay, and inadequate number of viral copies (<250 copies / mL). A negative result must be combined with clinical observations, patient history, and epidemiological information.  Fact Sheet for Patients:   StrictlyIdeas.no  Fact Sheet for Healthcare Providers: BankingDealers.co.za  This test is not yet approved or  cleared by the [place] FDA and has been authorized for detection and/or diagnosis of SARS-CoV-2 by FDA under an Emergency Use Authorization (EUA).  This EUA will remain in effect (meaning this test can be used) for the duration of the COVID-19 declaration under Section 564(b)(1) of the Act, 21 U.S.C. section 360bbb-3(b)(1), unless the authorization is terminated or revoked sooner.  Performed at [facility], [address] [place], [place] [address]   CULTURE, BLOOD (ROUTINE X 2) w Reflex to ID Panel     Status: None   Collection Time: 01/23/20   5:13 PM   Specimen: BLOOD  Result Value Ref Range Status   Specimen Description BLOOD BLOOD LEFT HAND  Final   Special Requests   Final    BOTTLES DRAWN AEROBIC AND ANAEROBIC Blood Culture adequate volume   Culture   Final    NO GROWTH 5 DAYS Performed at [facility], [address] [place], [place] [address]    Report Status 01/28/2020 FINAL  Final  CULTURE, BLOOD (ROUTINE X 2) w Reflex to ID Panel     Status: None   Collection Time: 01/23/20  6:12 PM   Specimen: BLOOD  Result Value Ref Range Status   Specimen Description BLOOD BLOOD LEFT WRIST  Final   Special Requests Blood Culture adequate volume  Final   Culture   Final    NO  GROWTH 5 DAYS Performed at [facility], [address] [place], [place] [address]    Report Status 01/28/2020 FINAL  Final  MRSA PCR Screening     Status: Abnormal   Collection Time: 01/25/20  8:17 AM   Specimen: Nasopharyngeal  Result Value Ref Range Status   MRSA by PCR POSITIVE (A) NEGATIVE Final    Comment:        The GeneXpert MRSA Assay (FDA approved for NASAL specimens only), is one component of a comprehensive MRSA colonization surveillance program. It is not intended to diagnose MRSA infection nor to guide or monitor treatment for MRSA infections. RESULT CALLED TO, READ BACK BY AND VERIFIED WITH: MARIA [facility] 01/25/20 1945 SJL Performed at [facility], [address] [place], [place] [address]   Body fluid culture     Status: None   Collection Time: 01/30/20  2:40 PM   Specimen: PATH Cytology Pleural fluid  Result Value Ref Range Status   Specimen Description   Final    PLEURAL Performed at [facility], [address], [place], [place] [address]    Special Requests   Final    NONE Performed at [facility], [address] [place], [place] [address]    Gram Stain   Final    RARE WBC PRESENT, PREDOMINANTLY MONONUCLEAR NO ORGANISMS SEEN    Culture   Final    NO GROWTH  3 DAYS Performed at [facility], [place] [address], [place], [place] [address]    Report Status 02/03/2020 FINAL  Final  Urine Culture     Status: Abnormal   Collection Time: 01/31/20 10:30 AM   Specimen: Urine, Random  Result Value Ref Range Status   Specimen Description   Final    URINE, RANDOM Performed at [facility], [address], [place], [place] [address]    Special Requests   Final    Normal Performed at [facility], [address] [place], [place] [address]    Culture MULTIPLE SPECIES PRESENT, SUGGEST RECOLLECTION (A)  Final   Report Status 02/01/2020 FINAL  Final  Body fluid culture     Status: None   Collection Time: 02/01/20  2:00 PM   Specimen: PATH Cytology Pleural fluid  Result Value Ref Range Status   Specimen Description   Final    PLEURAL Performed at [facility], [address] [place], [place] [address]    Special Requests   Final    NONE Performed at [facility], [place], [place] [address]    Gram Stain NO WBC SEEN NO ORGANISMS SEEN   Final   Culture   Final    NO GROWTH 3 DAYS Performed at [facility], [place] [address] [place],  [address]    Report Status 02/05/2020 FINAL  Final  MRSA PCR Screening     Status: Abnormal   Collection Time: 02/06/20 10:02 AM   Specimen: Nasopharyngeal  Result Value Ref Range Status   MRSA by PCR POSITIVE (A) NEGATIVE Final    Comment:        The GeneXpert MRSA Assay (FDA approved for NASAL specimens only), is one component of a comprehensive MRSA colonization surveillance program. It is not intended to diagnose MRSA infection nor to guide or monitor treatment for MRSA infections. RESULT CALLED TO, READ BACK BY AND VERIFIED WITH: MORALES JORGE AT 8182 ON 02/06/2020 BY SAINVILUS S Performed at [facility], [phone] [place], [place] [address]     Coagulation Studies: No results for input(s): LABPROT, INR in the  last 72 hours.  Urinalysis: No results for input(s): COLORURINE, LABSPEC, PHURINE, GLUCOSEU, HGBUR, BILIRUBINUR, KETONESUR, PROTEINUR, UROBILINOGEN, NITRITE, LEUKOCYTESUR in the last 72 hours.  Invalid input(s): APPERANCEUR    Imaging: No results found.   Medications:   . sodium chloride Stopped (02/04/20 1839)   . ferrous sulfate  325 mg Oral Q breakfast   And  . vitamin C  250 mg Oral Q breakfast  . azelastine  1 spray Each Nare BID  . Chlorhexidine Gluconate Cloth  6 each Topical Q0600  . epoetin (EPOGEN/PROCRIT) injection  10,000 Units Intravenous Q T,Th,Sa-HD  . feeding supplement (ENSURE ENLIVE)  237 mL Oral TID BM  . finasteride  5 mg Oral Daily  . insulin aspart  0-5 Units Subcutaneous QHS  . insulin aspart  0-9 Units Subcutaneous TID WC  . ipratropium-albuterol  3 mL Nebulization TID  . mouth rinse  15 mL Mouth Rinse BID  . metoprolol tartrate  50 mg Oral BID  . midodrine  5 mg Oral TID WC  . multivitamin  1 tablet Oral QHS  . mupirocin ointment  1 application Nasal BID  . pantoprazole  40 mg Oral Daily  . rosuvastatin  5 mg Oral QHS  . tamsulosin  0.4 mg Oral Daily   sodium chloride, acetaminophen, ammonium lactate, HYDROmorphone (DILAUDID) injection, ipratropium-albuterol, metoprolol tartrate, ondansetron (ZOFRAN) IV  Assessment/ Plan:  [name] is a 86 y.o. black male with acute renal failure on hemodialysis, hypertension, hyperlipidemia, diabetes mellitus type 2, GERD, obstructive sleep apnea, urothelial carcinoma of the bladder status post bilateral nephrostomy tube placement, permanent pacemaker placement secondary to sick sinus syndrome, SVT, who is admitted to [facility] on 01/23/2020 for Weakness [R53.1] SOB (shortness of breath) [R06.02] Acute renal failure superimposed on stage 4 chronic kidney disease (HCC) [N17.9, N18.4] Acute renal failure superimposed on chronic kidney disease, unspecified CKD stage, unspecified acute renal failure type ([place])  [N17.9, N18.9] Patient initiated on hemodialysis on 7/17 for uremic symptoms.   1. End Stage Renal Disease: PermCath placed July 26.  Family requesting placement at [facility].    discharge planning in progress.  Patient will get TTS shift Patient was able to sit in chair for the duration of dialysis on Wednesday and Friday Plan for dialysis on Friday.  Subsequently, patient can be discharged when medically ready.  He will start outpatient dialysis on Tuesday    2. Anemia of chronic kidney disease.   Lab Results  Component Value Date   HGB 8.1 (L) 02/08/2020  Maintain Epogen 10,000 IV with dialysis treatments.  3.  Atrial fibrillation with hypotension -Continue metoprolol for rate control -Midodrine for blood pressure  4. Bilateral pleural effusions: status post Right thoracentesis on 7/21 with 2.4 liters removed.  Left thoracentesis performed 02/01/2020.   LOS: [place] 7/30/20212:10 PM

## 2020-02-08 NOTE — Progress Notes (Signed)
Patient accepted at [facility] TTS 11:40am. Patient can start on Tuesday 8/3 at 11:25am.

## 2020-02-09 DIAGNOSIS — I480 Paroxysmal atrial fibrillation: Secondary | ICD-10-CM | POA: Diagnosis not present

## 2020-02-09 DIAGNOSIS — N179 Acute kidney failure, unspecified: Secondary | ICD-10-CM | POA: Diagnosis not present

## 2020-02-09 DIAGNOSIS — J9601 Acute respiratory failure with hypoxia: Secondary | ICD-10-CM | POA: Diagnosis not present

## 2020-02-09 DIAGNOSIS — I5033 Acute on chronic diastolic (congestive) heart failure: Secondary | ICD-10-CM | POA: Diagnosis not present

## 2020-02-09 LAB — BASIC METABOLIC PANEL
Anion gap: 12 (ref 5–15)
BUN: 30 mg/dL — ABNORMAL HIGH (ref 8–23)
CO2: 31 mmol/L (ref 22–32)
Calcium: 8.1 mg/dL — ABNORMAL LOW (ref 8.9–10.3)
Chloride: 95 mmol/L — ABNORMAL LOW (ref 98–111)
Creatinine, Ser: 4.46 mg/dL — ABNORMAL HIGH (ref 0.61–1.24)
GFR calc Af Amer: 12 mL/min — ABNORMAL LOW (ref >60)
GFR calc non Af Amer: 10 mL/min — ABNORMAL LOW (ref >60)
Glucose, Bld: 130 mg/dL — ABNORMAL HIGH (ref 70–99)
Potassium: 4.8 mmol/L (ref 3.5–5.1)
Sodium: 138 mmol/L (ref 135–145)

## 2020-02-09 LAB — GLUCOSE, CAPILLARY
Glucose-Capillary: 110 mg/dL — ABNORMAL HIGH (ref 70–99)
Glucose-Capillary: 158 mg/dL — ABNORMAL HIGH (ref 70–99)
Glucose-Capillary: 115 mg/dL — ABNORMAL HIGH (ref 70–99)
Glucose-Capillary: 150 mg/dL — ABNORMAL HIGH (ref 70–99)

## 2020-02-09 MED ORDER — IPRATROPIUM-ALBUTEROL 0.5-2.5 (3) MG/3ML IN SOLN
3.0000 mL | Freq: Two times a day (BID) | RESPIRATORY_TRACT | Status: DC
  Administered 2020-02-10 – 2020-02-11 (×3): 3 mL via RESPIRATORY_TRACT
  Filled 2020-02-09 (×5): qty 3

## 2020-02-09 NOTE — Progress Notes (Signed)
Blake [name]  MRN: [id number]  DOB/AGE: 84/04/1923 84 y.o.  Primary Care Physician:Klein, Tonette Bihari, MD  Admit date: 01/23/2020  Chief Complaint:  Chief Complaint  Patient presents with  . Urinary Retention    S-Pt presented on  01/23/2020 with  Chief Complaint  Patient presents with  . Urinary Retention  .    Pt offers no new complaints.  Patient resting comfortably  Medications   . ferrous sulfate  325 mg Oral Q breakfast   And  . vitamin C  250 mg Oral Q breakfast  . azelastine  1 spray Each Nare BID  . Chlorhexidine Gluconate Cloth  6 each Topical Q0600  . epoetin (EPOGEN/PROCRIT) injection  10,000 Units Intravenous Q T,Th,Sa-HD  . feeding supplement (ENSURE ENLIVE)  237 mL Oral TID BM  . finasteride  5 mg Oral Daily  . insulin aspart  0-5 Units Subcutaneous QHS  . insulin aspart  0-9 Units Subcutaneous TID WC  . ipratropium-albuterol  3 mL Nebulization TID  . mouth rinse  15 mL Mouth Rinse BID  . metoprolol tartrate  50 mg Oral BID  . midodrine  5 mg Oral TID WC  . multivitamin  1 tablet Oral QHS  . mupirocin ointment  1 application Nasal BID  . pantoprazole  40 mg Oral Daily  . rosuvastatin  5 mg Oral QHS  . tamsulosin  0.4 mg Oral Daily         QIH:KVQQV from the symptoms mentioned above,there are no other symptoms referable to all systems reviewed.  Physical Exam: Vital signs in last 24 hours: Temp:  [97.7 F (36.5 C)-98 F (36.7 C)] 98 F (36.7 C) (07/31 0517) Pulse Rate:  [65-134] 97 (07/31 0529) Resp:  [21-24] 21 (07/31 0526) BP: (117-147)/(76-96) 124/95 (07/31 0517) SpO2:  [96 %-100 %] 96 % (07/31 0746) Weight:  [96 kg] 96 kg (07/31 0500) Weight change:  Last BM Date: 02/07/20  Intake/Output from previous day: 07/30 0701 - 07/31 0700 In: 0  Out: 40 [Urine:40] No intake/output data recorded.   Physical Exam: General- pt is awake,alert, follows commands Resp- No acute REsp distress, minimal rhonchi CVS- S1S2 regular in rate and  rhythm GIT- BS+, soft, NT, ND EXT- 2+ LE Edema, Cyanosis Access tunneled catheter in situ right side   Lab Results: CBC Recent Labs    02/07/20 0540 02/08/20 0519  WBC 5.3 5.8  HGB 8.2* 8.1*  HCT 25.2* 26.2*  PLT 83* 84*    BMET Recent Labs    02/08/20 0519 02/09/20 0554  NA 138 138  K 4.9 4.8  CL 98 95*  CO2 29 31  GLUCOSE 126* 130*  BUN 43* 30*  CREATININE 5.53* 4.46*  CALCIUM 7.9* 8.1*    MICRO Recent Results (from the past 240 hour(s))  Body fluid culture     Status: None   Collection Time: 01/30/20  2:40 PM   Specimen: PATH Cytology Pleural fluid  Result Value Ref Range Status   Specimen Description   Final    PLEURAL Performed at [facility], [address], [place], [place] [address]    Special Requests   Final    NONE Performed at [facility], [address] [place], [place] [address]    Gram Stain   Final    RARE WBC PRESENT, PREDOMINANTLY MONONUCLEAR NO ORGANISMS SEEN    Culture   Final    NO GROWTH 3 DAYS Performed at [facility], [place]  [address], [place], [place] [address]    Report Status 02/03/2020 FINAL  Final  Urine Culture     Status: Abnormal   Collection Time: 01/31/20 10:30 AM   Specimen: Urine, Random  Result Value Ref Range Status   Specimen Description   Final    URINE, RANDOM Performed at [facility], [address], [place], [place] [address]    Special Requests   Final    Normal Performed at [facility], [address] [place], [place] [address]    Culture MULTIPLE SPECIES PRESENT, SUGGEST RECOLLECTION (A)  Final   Report Status 02/01/2020 FINAL  Final  Body fluid culture     Status: None   Collection Time: 02/01/20  2:00 PM   Specimen: PATH Cytology Pleural fluid  Result Value Ref Range Status   Specimen Description   Final    PLEURAL Performed at [facility], [address], [place], [place] [address]    Special Requests   Final    NONE Performed at  [facility], [place], [place] [address]    Gram Stain NO WBC SEEN NO ORGANISMS SEEN   Final   Culture   Final    NO GROWTH 3 DAYS Performed at [facility], [place] [address], [place], [place] [address]    Report Status 02/05/2020 FINAL  Final  MRSA PCR Screening     Status: Abnormal   Collection Time: 02/06/20 10:02 AM   Specimen: Nasopharyngeal  Result Value Ref Range Status   MRSA by PCR POSITIVE (A) NEGATIVE Final    Comment:        The GeneXpert MRSA Assay (FDA approved for NASAL specimens only), is one component of a comprehensive MRSA colonization surveillance program. It is not intended to diagnose MRSA infection nor to guide or monitor treatment for MRSA infections. RESULT CALLED TO, READ BACK BY AND VERIFIED WITH: MORALES JORGE AT 8185 ON 02/06/2020 BY Epifanio Lesches S Performed at [facility], [address] [place],  [address]       Lab Results  Component Value Date   PTH 152 (H) 01/26/2020   CALCIUM 8.1 (L) 02/09/2020   PHOS 2.7 02/01/2020               Impression: Patient is an 86 year old African-American male with a past medical history of CKD, hypertension hyperlipidemia, diabetes mellitus, obstructive sleep apnea,-year-old.  Carcinoma of bladder-s/p bilateral nephrostomy tube placement, status post pacemaker placement secondary to sick sinus syndrome, supraventricular tachycardia, who was admitted to hospital on July 14 with chief complaint of weakness, shortness of breath, acute kidney injury on chronic kidney disease.  1)Renal patient was admitted with AKI on CKD. Patient had his first day of dialysis on January 26, 2020. Patient has now transitioned to end-stage renal disease. Patient had his tunnel catheter placement done on February 04, 2020.Marland Kitchen Patient was able to tolerate dialysis on a chair. Patient was last dialyzed yesterday No need for renal placement therapy today    2)Hypotension  blood  pressure stable   3)Anemia of chronic disease  HGb is not at goal (9--11) Patient is on Epogen during dialysis  4) secondary hyperparathyroidism -CKD Mineral-Bone Disorder   Secondary Hyperparathyroidism -present.  Phosphorus at goal.   5) atrial fibrillation with hypotension Patient is on metoprolol for rate control and midodrine for blood pressure   6) electrolytes   sodium Normonatremic   potassium Normokalemic    7)Acid base Co2 at goal  8) bilateral pleural effusion Patient is s/p thoracentesis on right side on July 21 and on the left side on July 23   Plan:   No need for HD today     Blake [name] s Blake [name] 02/09/2020, 11:09 AM

## 2020-02-09 NOTE — TOC Progression Note (Addendum)
Transition of Care [facility]) - Progression Note    Patient Details  Name: Blake [name] MRN: [id number] Date of Birth: 12-Oct-1922  [facility] CM/SW Contact  Shelbie Ammons, RN Phone Number: 02/09/2020, 8:18 AM  Clinical Narrative:   RNCM received insurance authorization from [facility]. Ref# [id number] and [name] ID# [id number] with at start date of 7/30 and next review date of 8/3. Case manager assigned is Mickel Baas and her number is [phone].   Update: Spoke with Magda Paganini with [facility], patient will need to be admitted on Monday due to inability to get meds over weekend. MD updated.     Expected Discharge Plan: [place] Barriers to Discharge: Continued Medical Work up  Expected Discharge Plan and Services Expected Discharge Plan: [place] arrangements for the past 2 months: Single Family Home                                       Social Determinants of Health (SDOH) Interventions    Readmission Risk Interventions Readmission Risk Prevention Plan 01/25/2020 09/25/2019 12/30/2018  Transportation Screening Complete Complete Complete  PCP or Specialist Appt within 3-5 Days - Complete -  HRI or [place] - Complete -  Social Work Consult for [place] Planning/Counseling - Complete -  Palliative Care Screening - Not Applicable -  Medication Review Press photographer) Complete Referral to Pharmacy Complete  HRI or [place] Complete - [place] Patient Refused - -  Some recent data might be hidden

## 2020-02-09 NOTE — Plan of Care (Signed)
Continuing with plan of care. 

## 2020-02-09 NOTE — Progress Notes (Signed)
PROGRESS NOTE    Blake [name]  BJY:[id number] DOB: 1922-10-26 DOA: 01/23/2020 PCP: Adin Hector, MD  Brief Narrative:  HPI: Blake [name] is a 86 y.o. male with medical history significant of hypertension, hyperlipidemia, diabetes mellitus, GERD, OSA, prostate cancer, urothelial carcinoma of the bladder, s/p of bilateral nephrostomy, pacemaker placement due to SSS, GI bleeding, dCHF, SVT, CKD-IV, thrombocytopenia, chronic respiratory failure on 2 L nasal cannula oxygen at night at home, who presents with decreased urine output.  Per his son and daughter, pt has hx of urothelial carcinoma of the bladder and is s/p of bilateral nephrostomy. Pt was noted to have decreased urine output at home. Not sure if pt has symptoms of UTI.  Does not seem to have hematuria. Pt is using 2L oxygen at night at home, but not in the daytime.  Patient was found to have oxygen desaturated to 88% on room air, which improved to 95 to 96% on 2 L nasal cannula oxygen in ED. patient seems to have mild shortness breath, but no cough, chest pain.  He has left lower leg edema. No fever or chills.  Patient has little loose stool bowel movement recently, but no active diarrhea, nausea, vomiting, abdominal pain per his son. Per EDP, pt recently had cough and URI. Also reportedly recently finished antibiotics. Pt knows his own name, but not oriented to place and time.   7/28: Patient seen and examined.  Care assumed from previous hospitalist today.  Overall my evaluation patient is a little lethargic but otherwise in no acute distress.  He is scheduled for hemodialysis today.  He remains on 2 L nasal cannula.  7/29: Patient seen and examined.  Lethargic this morning.  Patient son is at bedside.  Upon prompting patient is able to open his eyes and does respond verbally to questioning.  Remains on 2 L nasal cannula.  7/30: Patient seen and examined.  Completed hemodialysis this morning.  Lethargic on my arrival but does  awaken.  Rather difficult to understand without bottom teeth and.  Remains on 2 L nasal cannula.  Does not appear in any distress.  7/31: Patient seen and examined.  Much more awake this morning.  Answers all questions appropriately.  Remains on 2 L nasal cannula.  No distress noted.  Communicated with case management, patient has a bed at [facility] however they are unable to acquire medications until Monday so patient cannot discharge until that time.  He is currently medically stable for discharge.   Assessment & Plan:   Principal Problem:   Acute renal failure superimposed on stage 4 chronic kidney disease (HCC) Active Problems:   SVT (supraventricular tachycardia) (HCC)   Atrial fibrillation with RVR (HCC)   HLD (hyperlipidemia)   Bleeding   Pressure injury, stage 3 (HCC)   Recurrent UTI   Urothelial carcinoma of bladder (HCC)   Thrombocytopenia (HCC)   Acute on chronic diastolic CHF (congestive heart failure) (HCC)   HTN (hypertension)   Type II diabetes mellitus with renal manifestations (HCC)   Acute respiratory failure with hypoxia (HCC)   Normocytic anemia   Protein-calorie malnutrition, severe   Pleural effusion   Pressure injury of buttock, stage 2 (HCC)   ESRD (end stage renal disease) (HCC)   Drainage from wound   Tachycardia   Weakness   Non-sustained ventricular tachycardia (HCC)   AF (paroxysmal atrial fibrillation) ([place])  End-stage renal disease on hemodialysis Bilateral nephrostomy tubes Patient has permacath in place Patient was able  to tolerate dialysis well sitting up No bleeding noted from PermCath site Per nephrology patient does have an outpatient HD chair Outpatient HD cannot start till next Tuesday 02/12/20 Plan: Defer to nephrology regarding inpatient hemodialysis schedule Discharge planning to skilled nursing facility on Monday, 02/11/2020  Bilateral pleural effusions Status post right thoracentesis on 01/30/2020, 2400 cc fluid removed Status  post left thoracentesis 02/01/2020, 750 cc removed  Paroxysmal atrial fibrillation Supraventricular tachycardia NSVT Heart rate stable over interval Continue metoprolol twice daily  Acute hypoxic respiratory failure Currently on 2 L oxygen Dialysis as above Wean oxygen as tolerated  Acute on chronic diastolic congestive heart failure HD for fluid removal  Thrombocytopenia Platelet count responded to platelet transfusion No pericatheter bleeding noted  Chronic decubitus ulcer Present on admission Stage II on sacrum WC consult  Weakness Functional decline PT evaluation ordered  Right nephrostomy tube drainage Evaluated by IR Tube not exchanged Was empirically started on vancomycin and Zosyn De-escalate vancomycin and Augmentin Completed 7-day course of antibiotics No further need for antibiotics    DVT prophylaxis: SCD Code Status: Full Family Communication: Daughter via phone Disposition Plan: Status is: Inpatient  Remains inpatient appropriate because:Unsafe d/c plan   Dispo: The patient is from: Home              Anticipated d/c is to: SNF              Anticipated d/c date is: 2 days              Patient currently is medically stable to d/c.   Patient is currently stable from an inpatient medicine standpoint for discharge to skilled nursing facility.  He is dependent on 2 L nasal cannula however I suspect with the physical rehabilitation and exertion will be able to titrate off.  Dialysis will be helpful to fluid removal as well.  Currently patient is medically stable however skilled nursing facility is unable to obtain medications until Monday.  Plan on monitoring over the weekend and discharge to skilled nursing facility on Monday 02/11/20         Consultants:   Nephrology  Procedures:   Permacath  Antimicrobials:   Vancomycin, completed  Augmentin, completed   Subjective: Patient seen and examined.  Endorses fatigue.  No pain  complaints.  Objective: Vitals:   02/09/20 0526 02/09/20 0529 02/09/20 0746 02/09/20 1139  BP:    107/73  Pulse: 101 97  62  Resp: 21   20  Temp:    97.7 F (36.5 C)  TempSrc:      SpO2:   96% 100%  Weight:      Height:        Intake/Output Summary (Last 24 hours) at 02/09/2020 1228 Last data filed at 02/09/2020 9563 Gross per 24 hour  Intake --  Output 40 ml  Net -40 ml   Filed Weights   02/04/20 0500 02/06/20 0458 02/09/20 0500  Weight: (!) 92.2 kg (!) 95.7 kg (!) 96 kg    Examination:  General: No acute distress, patient appears chronically ill, appears stated age 38: Normocephalic, atraumatic Neck, supple, trachea midline, no tenderness Heart: Tachycardic, irregular rhythm, no murmurs Lungs: Bibasilar crackles, normal work of breathing Abdomen: Soft, nontender, nondistended, positive bowel sounds Extremities: Normal, atraumatic, no clubbing or cyanosis, normal muscle tone Skin: No rashes or lesions, normal color Neurologic: Cranial nerves grossly intact, sensation intact, alert and oriented x2 Psychiatric: Normal affect     Data Reviewed: I have personally reviewed following  labs and imaging studies  CBC: Recent Labs  Lab 02/03/20 0652 02/05/20 0427 02/06/20 1053 02/07/20 0540 02/08/20 0519  WBC 6.3 6.3 5.7 5.3 5.8  NEUTROABS  --   --  4.5 4.1 4.5  HGB 8.7* 8.8* 8.5* 8.2* 8.1*  HCT 26.6* 26.9* 26.3* 25.2* 26.2*  MCV 88.4 88.5 88.3 88.4 91.6  PLT 58* 106* 96* 83* 84*   Basic Metabolic Panel: Recent Labs  Lab 02/05/20 0427 02/06/20 1053 02/07/20 0540 02/08/20 0519 02/09/20 0554  NA 138 136 139 138 138  K 4.4 4.2 4.3 4.9 4.8  CL 96* 94* 96* 98 95*  CO2 29 29 31 29 31   GLUCOSE 163* 179* 130* 126* 130*  BUN 85* 51* 35* 43* 30*  CREATININE 8.32* 5.77* 4.59* 5.53* 4.46*  CALCIUM 7.8* 7.7* 7.7* 7.9* 8.1*   GFR: Estimated Creatinine Clearance: 11.9 mL/min (A) (by C-G formula based on SCr of 4.46 mg/dL (H)). Liver Function Tests: No results  for input(s): AST, ALT, ALKPHOS, BILITOT, PROT, ALBUMIN in the last 168 hours. No results for input(s): LIPASE, AMYLASE in the last 168 hours. No results for input(s): AMMONIA in the last 168 hours. Coagulation Profile: No results for input(s): INR, PROTIME in the last 168 hours. Cardiac Enzymes: No results for input(s): CKTOTAL, CKMB, CKMBINDEX, TROPONINI in the last 168 hours. BNP (last 3 results) No results for input(s): PROBNP in the last 8760 hours. HbA1C: No results for input(s): HGBA1C in the last 72 hours. CBG: Recent Labs  Lab 02/08/20 1257 02/08/20 1635 02/08/20 2055 02/09/20 0754 02/09/20 1140  GLUCAP 94 149* 184* 110* 158*   Lipid Profile: No results for input(s): CHOL, HDL, LDLCALC, TRIG, CHOLHDL, LDLDIRECT in the last 72 hours. Thyroid Function Tests: No results for input(s): TSH, T4TOTAL, FREET4, T3FREE, THYROIDAB in the last 72 hours. Anemia Panel: No results for input(s): VITAMINB12, FOLATE, FERRITIN, TIBC, IRON, RETICCTPCT in the last 72 hours. Sepsis Labs: No results for input(s): PROCALCITON, LATICACIDVEN in the last 168 hours.  Recent Results (from the past 240 hour(s))  Body fluid culture     Status: None   Collection Time: 01/30/20  2:40 PM   Specimen: PATH Cytology Pleural fluid  Result Value Ref Range Status   Specimen Description   Final    PLEURAL Performed at [facility], [address], [place], [place] [address]    Special Requests   Final    NONE Performed at [facility], [address] [place], [place] [address]    Gram Stain   Final    RARE WBC PRESENT, PREDOMINANTLY MONONUCLEAR NO ORGANISMS SEEN    Culture   Final    NO GROWTH 3 DAYS Performed at [facility] [address] [place], [place] [address]    Report Status 02/03/2020 FINAL  Final  Urine Culture     Status: Abnormal   Collection Time: 01/31/20 10:30 AM   Specimen: Urine, Random  Result Value Ref Range Status   Specimen Description    Final    URINE, RANDOM Performed at [facility], [address], [place], [place] [address]    Special Requests   Final    Normal Performed at [facility], [address] [place], [place] [address]    Culture MULTIPLE SPECIES PRESENT, SUGGEST RECOLLECTION (A)  Final   Report Status 02/01/2020 FINAL  Final  Body fluid culture     Status: None   Collection Time: 02/01/20  2:00 PM   Specimen: PATH  Cytology Pleural fluid  Result Value Ref Range Status   Specimen Description   Final    PLEURAL Performed at [facility], [address] [place], [place] [address]    Special Requests   Final    NONE Performed at [facility], [place], [place] [address]    Gram Stain NO WBC SEEN NO ORGANISMS SEEN   Final   Culture   Final    NO GROWTH 3 DAYS Performed at [facility], [place] [address], [place], [place] [address]    Report Status 02/05/2020 FINAL  Final  MRSA PCR Screening     Status: Abnormal   Collection Time: 02/06/20 10:02 AM   Specimen: Nasopharyngeal  Result Value Ref Range Status   MRSA by PCR POSITIVE (A) NEGATIVE Final    Comment:        The GeneXpert MRSA Assay (FDA approved for NASAL specimens only), is one component of a comprehensive MRSA colonization surveillance program. It is not intended to diagnose MRSA infection nor to guide or monitor treatment for MRSA infections. RESULT CALLED TO, READ BACK BY AND VERIFIED WITH: MORALES JORGE AT 5465 ON 02/06/2020 BY Epifanio Lesches S Performed at [facility], [address], [place], [place] [address]          Radiology Studies: [facility] 1 View  Result Date: 02/08/2020 CLINICAL DATA:  Hypoxia. EXAM: PORTABLE CHEST 1 VIEW COMPARISON:  February 01, 2020. FINDINGS: Stable cardiomegaly. Interval placement of right internal jugular dialysis catheter with tips in expected position of cavoatrial junction. Left-sided pacemaker is unchanged. No  pneumothorax is noted. Large right pleural effusion is noted with probable underlying atelectasis or infiltrate. Left basilar atelectasis or infiltrate is noted with probable associated pleural effusion. Bony thorax is unremarkable. IMPRESSION: Large right pleural effusion is noted with probable underlying atelectasis or infiltrate. Left basilar atelectasis or infiltrate is noted with probable associated pleural effusion. Aortic Atherosclerosis (ICD10-I70.0). Electronically Signed   By: Marijo Conception M.D.   On: 02/08/2020 14:26        Scheduled Meds: . ferrous sulfate  325 mg Oral Q breakfast   And  . vitamin C  250 mg Oral Q breakfast  . azelastine  1 spray Each Nare BID  . Chlorhexidine Gluconate Cloth  6 each Topical Q0600  . epoetin (EPOGEN/PROCRIT) injection  10,000 Units Intravenous Q T,Th,Sa-HD  . feeding supplement (ENSURE ENLIVE)  237 mL Oral TID BM  . finasteride  5 mg Oral Daily  . insulin aspart  0-5 Units Subcutaneous QHS  . insulin aspart  0-9 Units Subcutaneous TID WC  . ipratropium-albuterol  3 mL Nebulization TID  . mouth rinse  15 mL Mouth Rinse BID  . metoprolol tartrate  50 mg Oral BID  . midodrine  5 mg Oral TID WC  . multivitamin  1 tablet Oral QHS  . mupirocin ointment  1 application Nasal BID  . pantoprazole  40 mg Oral Daily  . rosuvastatin  5 mg Oral QHS  . tamsulosin  0.4 mg Oral Daily   Continuous Infusions: . sodium chloride Stopped (02/04/20 1839)     LOS: 17 days    Time spent: 15 minutes    Sidney Ace, MD [facility]   If 7PM-7AM, please contact night-coverage 02/09/2020, 12:28 PM

## 2020-02-10 DIAGNOSIS — N179 Acute kidney failure, unspecified: Secondary | ICD-10-CM | POA: Diagnosis not present

## 2020-02-10 DIAGNOSIS — I5033 Acute on chronic diastolic (congestive) heart failure: Secondary | ICD-10-CM | POA: Diagnosis not present

## 2020-02-10 DIAGNOSIS — J9601 Acute respiratory failure with hypoxia: Secondary | ICD-10-CM | POA: Diagnosis not present

## 2020-02-10 DIAGNOSIS — I480 Paroxysmal atrial fibrillation: Secondary | ICD-10-CM | POA: Diagnosis not present

## 2020-02-10 LAB — BASIC METABOLIC PANEL
Anion gap: 14 (ref 5–15)
BUN: 43 mg/dL — ABNORMAL HIGH (ref 8–23)
CO2: 28 mmol/L (ref 22–32)
Calcium: 8.1 mg/dL — ABNORMAL LOW (ref 8.9–10.3)
Chloride: 94 mmol/L — ABNORMAL LOW (ref 98–111)
Creatinine, Ser: 5.23 mg/dL — ABNORMAL HIGH (ref 0.61–1.24)
GFR calc Af Amer: 10 mL/min — ABNORMAL LOW (ref >60)
GFR calc non Af Amer: 9 mL/min — ABNORMAL LOW (ref >60)
Glucose, Bld: 111 mg/dL — ABNORMAL HIGH (ref 70–99)
Potassium: 4.3 mmol/L (ref 3.5–5.1)
Sodium: 136 mmol/L (ref 135–145)

## 2020-02-10 LAB — GLUCOSE, CAPILLARY
Glucose-Capillary: 98 mg/dL (ref 70–99)
Glucose-Capillary: 134 mg/dL — ABNORMAL HIGH (ref 70–99)
Glucose-Capillary: 137 mg/dL — ABNORMAL HIGH (ref 70–99)
Glucose-Capillary: 156 mg/dL — ABNORMAL HIGH (ref 70–99)

## 2020-02-10 NOTE — Progress Notes (Signed)
PROGRESS NOTE    Blake [name]  GYI:[id number] DOB: 11/14/1922 DOA: 01/23/2020 PCP: Adin Hector, MD  Brief Narrative:  HPI: Blake [name] is a 86 y.o. male with medical history significant of hypertension, hyperlipidemia, diabetes mellitus, GERD, OSA, prostate cancer, urothelial carcinoma of the bladder, s/p of bilateral nephrostomy, pacemaker placement due to SSS, GI bleeding, dCHF, SVT, CKD-IV, thrombocytopenia, chronic respiratory failure on 2 L nasal cannula oxygen at night at home, who presents with decreased urine output.  Per his son and daughter, pt has hx of urothelial carcinoma of the bladder and is s/p of bilateral nephrostomy. Pt was noted to have decreased urine output at home. Not sure if pt has symptoms of UTI.  Does not seem to have hematuria. Pt is using 2L oxygen at night at home, but not in the daytime.  Patient was found to have oxygen desaturated to 88% on room air, which improved to 95 to 96% on 2 L nasal cannula oxygen in ED. patient seems to have mild shortness breath, but no cough, chest pain.  He has left lower leg edema. No fever or chills.  Patient has little loose stool bowel movement recently, but no active diarrhea, nausea, vomiting, abdominal pain per his son. Per EDP, pt recently had cough and URI. Also reportedly recently finished antibiotics. Pt knows his own name, but not oriented to place and time.   7/28: Patient seen and examined.  Care assumed from previous hospitalist today.  Overall my evaluation patient is a little lethargic but otherwise in no acute distress.  He is scheduled for hemodialysis today.  He remains on 2 L nasal cannula.  7/29: Patient seen and examined.  Lethargic this morning.  Patient son is at bedside.  Upon prompting patient is able to open his eyes and does respond verbally to questioning.  Remains on 2 L nasal cannula.  7/30: Patient seen and examined.  Completed hemodialysis this morning.  Lethargic on my arrival but does  awaken.  Rather difficult to understand without bottom teeth and.  Remains on 2 L nasal cannula.  Does not appear in any distress.  7/31: Patient seen and examined.  Much more awake this morning.  Answers all questions appropriately.  Remains on 2 L nasal cannula.  No distress noted.  Communicated with case management, patient has a bed at [facility] however they are unable to acquire medications until Monday so patient cannot discharge until that time.  He is currently medically stable for discharge.  8/1: Patient seen and examined.  Sleeping on my arrival this morning but easily awakened.  Remains on 2 L nasal cannula.  No respiratory distress noted.  Seen by nephrology today.  No emergent need for hemodialysis.  Tentative plan to discharge tomorrow.  Tentative plan to discharge to skilled nursing facility tomorrow 02/11/20   Assessment & Plan:   Principal Problem:   Acute renal failure superimposed on stage 4 chronic kidney disease ([place]) Active Problems:   SVT (supraventricular tachycardia) (HCC)   Atrial fibrillation with RVR (HCC)   HLD (hyperlipidemia)   Bleeding   Pressure injury, stage 3 (HCC)   Recurrent UTI   Urothelial carcinoma of bladder (HCC)   Thrombocytopenia (HCC)   Acute on chronic diastolic CHF (congestive heart failure) (HCC)   HTN (hypertension)   Type II diabetes mellitus with renal manifestations (HCC)   Acute respiratory failure with hypoxia (HCC)   Normocytic anemia   Protein-calorie malnutrition, severe   Pleural effusion  Pressure injury of buttock, stage 2 (HCC)   ESRD (end stage renal disease) (HCC)   Drainage from wound   Tachycardia   Weakness   Non-sustained ventricular tachycardia (HCC)   AF (paroxysmal atrial fibrillation) (HCC)  End-stage renal disease on hemodialysis Bilateral nephrostomy tubes Patient has permacath in place Patient was able to tolerate dialysis well sitting up No bleeding noted from PermCath site Per nephrology patient  does have an outpatient HD chair Outpatient HD cannot start till next Tuesday 02/12/20 Plan: Defer to nephrology regarding inpatient hemodialysis schedule Per Dr. Toya Smothers note, plan for HD tomorrow 8/2 Discharge planning to skilled nursing facility on Monday, 02/11/2020  Bilateral pleural effusions Status post right thoracentesis on 01/30/2020, 2400 cc fluid removed Status post left thoracentesis 02/01/2020, 750 cc removed  Paroxysmal atrial fibrillation Supraventricular tachycardia NSVT Heart rate stable over interval Continue metoprolol twice daily  Acute hypoxic respiratory failure Currently on 2 L oxygen Dialysis as above Wean oxygen as tolerated  Acute on chronic diastolic congestive heart failure HD for fluid removal  Thrombocytopenia Platelet count responded to platelet transfusion No pericatheter bleeding noted  Chronic decubitus ulcer Present on admission Stage II on sacrum WC consult  Weakness Functional decline PT evaluation ordered  Right nephrostomy tube drainage Evaluated by IR Tube not exchanged Was empirically started on vancomycin and Zosyn De-escalate vancomycin and Augmentin Completed 7-day course of antibiotics No further need for antibiotics    DVT prophylaxis: SCD Code Status: Full Family Communication: Daughter via phone Disposition Plan: Status is: Inpatient  Remains inpatient appropriate because:Unsafe d/c plan   Dispo: The patient is from: Home              Anticipated d/c is to: SNF              Anticipated d/c date is: 2 days              Patient currently is medically stable to d/c.   Patient stable from inpatient standpoint for discharge to skilled nursing facility.  Remains on 2 L nasal cannula.  Tentative plan for discharge on 8-21 to skilled nursing         Consultants:   Nephrology  Procedures:   Permacath  Antimicrobials:   Vancomycin, completed  Augmentin, completed   Subjective: Patient seen and  examined.  Complains of malaise.  Objective: Vitals:   02/09/20 2102 02/10/20 0432 02/10/20 0725 02/10/20 1210  BP: (!) 124/90 (!) 115/88  113/72  Pulse:  90  62  Resp:  (!) 24  18  Temp:  97.8 F (36.6 C)  (!) 97.5 F (36.4 C)  TempSrc:  Oral  Oral  SpO2:  99% 97% 100%  Weight:      Height:        Intake/Output Summary (Last 24 hours) at 02/10/2020 1317 Last data filed at 02/10/2020 0901 Gross per 24 hour  Intake 240 ml  Output 280 ml  Net -40 ml   Filed Weights   02/04/20 0500 02/06/20 0458 02/09/20 0500  Weight: (!) 92.2 kg (!) 95.7 kg (!) 96 kg    Examination: General: No acute distress, patient appears chronically ill, appears stated age 44: Normocephalic, atraumatic Neck, supple, trachea midline, no tenderness Heart: Tachycardic, irregular rhythm, no murmurs Lungs: Bibasilar crackles, normal work of breathing Abdomen: Soft, nontender, nondistended, positive bowel sounds Extremities: Normal, atraumatic, no clubbing or cyanosis, normal muscle tone Skin: No rashes or lesions, normal color Neurologic: Cranial nerves grossly intact, sensation intact, alert and oriented  x2 Psychiatric: Normal affect     Data Reviewed: I have personally reviewed following labs and imaging studies  CBC: Recent Labs  Lab 02/05/20 0427 02/06/20 1053 02/07/20 0540 02/08/20 0519  WBC 6.3 5.7 5.3 5.8  NEUTROABS  --  4.5 4.1 4.5  HGB 8.8* 8.5* 8.2* 8.1*  HCT 26.9* 26.3* 25.2* 26.2*  MCV 88.5 88.3 88.4 91.6  PLT 106* 96* 83* 84*   Basic Metabolic Panel: Recent Labs  Lab 02/06/20 1053 02/07/20 0540 02/08/20 0519 02/09/20 0554 02/10/20 0503  NA 136 139 138 138 136  K 4.2 4.3 4.9 4.8 4.3  CL 94* 96* 98 95* 94*  CO2 29 31 29 31 28   GLUCOSE 179* 130* 126* 130* 111*  BUN 51* 35* 43* 30* 43*  CREATININE 5.77* 4.59* 5.53* 4.46* 5.23*  CALCIUM 7.7* 7.7* 7.9* 8.1* 8.1*   GFR: Estimated Creatinine Clearance: 10.2 mL/min (A) (by C-G formula based on SCr of 5.23 mg/dL  (H)). Liver Function Tests: No results for input(s): AST, ALT, ALKPHOS, BILITOT, PROT, ALBUMIN in the last 168 hours. No results for input(s): LIPASE, AMYLASE in the last 168 hours. No results for input(s): AMMONIA in the last 168 hours. Coagulation Profile: No results for input(s): INR, PROTIME in the last 168 hours. Cardiac Enzymes: No results for input(s): CKTOTAL, CKMB, CKMBINDEX, TROPONINI in the last 168 hours. BNP (last 3 results) No results for input(s): PROBNP in the last 8760 hours. HbA1C: No results for input(s): HGBA1C in the last 72 hours. CBG: Recent Labs  Lab 02/09/20 1140 02/09/20 1633 02/09/20 2101 02/10/20 0743 02/10/20 1207  GLUCAP 158* 115* 150* 98 134*   Lipid Profile: No results for input(s): CHOL, HDL, LDLCALC, TRIG, CHOLHDL, LDLDIRECT in the last 72 hours. Thyroid Function Tests: No results for input(s): TSH, T4TOTAL, FREET4, T3FREE, THYROIDAB in the last 72 hours. Anemia Panel: No results for input(s): VITAMINB12, FOLATE, FERRITIN, TIBC, IRON, RETICCTPCT in the last 72 hours. Sepsis Labs: No results for input(s): PROCALCITON, LATICACIDVEN in the last 168 hours.  Recent Results (from the past 240 hour(s))  Body fluid culture     Status: None   Collection Time: 02/01/20  2:00 PM   Specimen: PATH Cytology Pleural fluid  Result Value Ref Range Status   Specimen Description   Final    PLEURAL Performed at [facility], [address], [place], [place] [address]    Special Requests   Final    NONE Performed at [facility], [place], [place] [address]    Gram Stain NO WBC SEEN NO ORGANISMS SEEN   Final   Culture   Final    NO GROWTH 3 DAYS Performed at [facility], [place] [address] [place], [place] [address]    Report Status 02/05/2020 FINAL  Final  MRSA PCR Screening     Status: Abnormal   Collection Time: 02/06/20 10:02 AM   Specimen: Nasopharyngeal  Result Value Ref Range Status   MRSA by PCR  POSITIVE (A) NEGATIVE Final    Comment:        The GeneXpert MRSA Assay (FDA approved for NASAL specimens only), is one component of a comprehensive MRSA colonization surveillance program. It is not intended to diagnose MRSA infection nor to guide or monitor treatment for MRSA infections. RESULT CALLED TO, READ BACK BY AND VERIFIED WITH: MORALES JORGE AT 0539 ON 02/06/2020 BY Epifanio Lesches S Performed at [facility], [address], [place],  [address]  Radiology Studies: DG Chest Port 1 View  Result Date: 02/08/2020 CLINICAL DATA:  Hypoxia. EXAM: PORTABLE CHEST 1 VIEW COMPARISON:  February 01, 2020. FINDINGS: Stable cardiomegaly. Interval placement of right internal jugular dialysis catheter with tips in expected position of cavoatrial junction. Left-sided pacemaker is unchanged. No pneumothorax is noted. Large right pleural effusion is noted with probable underlying atelectasis or infiltrate. Left basilar atelectasis or infiltrate is noted with probable associated pleural effusion. Bony thorax is unremarkable. IMPRESSION: Large right pleural effusion is noted with probable underlying atelectasis or infiltrate. Left basilar atelectasis or infiltrate is noted with probable associated pleural effusion. Aortic Atherosclerosis (ICD10-I70.0). Electronically Signed   By: Marijo Conception M.D.   On: 02/08/2020 14:26        Scheduled Meds: . ferrous sulfate  325 mg Oral Q breakfast   And  . vitamin C  250 mg Oral Q breakfast  . azelastine  1 spray Each Nare BID  . Chlorhexidine Gluconate Cloth  6 each Topical Q0600  . epoetin (EPOGEN/PROCRIT) injection  10,000 Units Intravenous Q T,Th,Sa-HD  . feeding supplement (ENSURE ENLIVE)  237 mL Oral TID BM  . finasteride  5 mg Oral Daily  . insulin aspart  0-5 Units Subcutaneous QHS  . insulin aspart  0-9 Units Subcutaneous TID WC  . ipratropium-albuterol  3 mL Nebulization BID  . mouth rinse  15 mL Mouth Rinse BID  .  metoprolol tartrate  50 mg Oral BID  . midodrine  5 mg Oral TID WC  . multivitamin  1 tablet Oral QHS  . mupirocin ointment  1 application Nasal BID  . pantoprazole  40 mg Oral Daily  . rosuvastatin  5 mg Oral QHS  . tamsulosin  0.4 mg Oral Daily   Continuous Infusions: . sodium chloride Stopped (02/04/20 1839)     LOS: 18 days    Time spent: 15 minutes    Sidney Ace, MD [facility]   If 7PM-7AM, please contact night-coverage 02/10/2020, 1:17 PM

## 2020-02-10 NOTE — Progress Notes (Signed)
Blake [name]  MRN: [id number]  DOB/AGE: Jun 17, 1923 86 y.o.  Primary Care Physician:Klein, Tonette Bihari, MD  Admit date: 01/23/2020  Chief Complaint:  Chief Complaint  Patient presents with  . Urinary Retention    S-Pt presented on  01/23/2020 with  Chief Complaint  Patient presents with  . Urinary Retention  .    Pt offers no new complaints.    Medications   . ferrous sulfate  325 mg Oral Q breakfast   And  . vitamin C  250 mg Oral Q breakfast  . azelastine  1 spray Each Nare BID  . Chlorhexidine Gluconate Cloth  6 each Topical Q0600  . epoetin (EPOGEN/PROCRIT) injection  10,000 Units Intravenous Q T,Th,Sa-HD  . feeding supplement (ENSURE ENLIVE)  237 mL Oral TID BM  . finasteride  5 mg Oral Daily  . insulin aspart  0-5 Units Subcutaneous QHS  . insulin aspart  0-9 Units Subcutaneous TID WC  . ipratropium-albuterol  3 mL Nebulization BID  . mouth rinse  15 mL Mouth Rinse BID  . metoprolol tartrate  50 mg Oral BID  . midodrine  5 mg Oral TID WC  . multivitamin  1 tablet Oral QHS  . mupirocin ointment  1 application Nasal BID  . pantoprazole  40 mg Oral Daily  . rosuvastatin  5 mg Oral QHS  . tamsulosin  0.4 mg Oral Daily         [name]:KCLEX from the symptoms mentioned above,there are no other symptoms referable to all systems reviewed.  Physical Exam: Vital signs in last 24 hours: Temp:  [97.7 F (36.5 C)-97.8 F (36.6 C)] 97.8 F (36.6 C) (08/01 0432) Pulse Rate:  [62-104] 90 (08/01 0432) Resp:  [20-24] 24 (08/01 0432) BP: (96-124)/(72-90) 115/88 (08/01 0432) SpO2:  [94 %-100 %] 97 % (08/01 0725) Weight change:  Last BM Date: 02/09/20  Intake/Output from previous day: 07/31 0701 - 08/01 0700 In: -  Out: 280 [Urine:280] Total I/O In: 240 [P.O.:240] Out: -    Physical Exam: General- pt is awake,alert, follows commands Resp- No acute REsp distress, minimal rhonchi CVS- S1S2 regular in rate and rhythm GIT- BS+, soft, NT, ND EXT- 2+ LE Edema, no  Cyanosis Access tunneled catheter in situ right side   Lab Results: CBC Recent Labs    02/08/20 0519  WBC 5.8  HGB 8.1*  HCT 26.2*  PLT 84*    BMET Recent Labs    02/09/20 0554 02/10/20 0503  NA 138 136  K 4.8 4.3  CL 95* 94*  CO2 31 28  GLUCOSE 130* 111*  BUN 30* 43*  CREATININE 4.46* 5.23*  CALCIUM 8.1* 8.1*    MICRO Recent Results (from the past 240 hour(s))  Body fluid culture     Status: None   Collection Time: 02/01/20  2:00 PM   Specimen: PATH Cytology Pleural fluid  Result Value Ref Range Status   Specimen Description   Final    PLEURAL Performed at [facility], [address], [place], [place] [address]    Special Requests   Final    NONE Performed at [facility], [address] [place], [place] [address]    Gram Stain NO WBC SEEN NO ORGANISMS SEEN   Final   Culture   Final    NO GROWTH 3 DAYS Performed at [facility], [place] [address], [place], [place] [address]    Report Status 02/05/2020 FINAL  Final  MRSA PCR Screening  Status: Abnormal   Collection Time: 02/06/20 10:02 AM   Specimen: Nasopharyngeal  Result Value Ref Range Status   MRSA by PCR POSITIVE (A) NEGATIVE Final    Comment:        The GeneXpert MRSA Assay (FDA approved for NASAL specimens only), is one component of a comprehensive MRSA colonization surveillance program. It is not intended to diagnose MRSA infection nor to guide or monitor treatment for MRSA infections. RESULT CALLED TO, READ BACK BY AND VERIFIED WITH: MORALES JORGE AT 5520 ON 02/06/2020 BY Epifanio Lesches S Performed at [facility], [address] [place], [place] [address]       Lab Results  Component Value Date   PTH 152 (H) 01/26/2020   CALCIUM 8.1 (L) 02/10/2020   PHOS 2.7 02/01/2020               Impression: Patient is a 86 year old African-American male with a past medical history of CKD, hypertension hyperlipidemia, diabetes mellitus,  obstructive sleep apnea,-year-old.  Carcinoma of bladder-s/p bilateral nephrostomy tube placement, status post pacemaker placement secondary to sick sinus syndrome, supraventricular tachycardia, who was admitted to hospital on July 14 with chief complaint of weakness, shortness of breath, acute kidney injury on chronic kidney disease.  1)Renal  patient was admitted with AKI on CKD. Patient had his first day of dialysis on January 26, 2020. Patient has now transitioned to end-stage renal disease. Patient had his tunnel catheter placement done on February 04, 2020.Marland Kitchen Patient was able to tolerate dialysis on a chair. Patient was last dialyzed on Friday  no need for renal placement therapy today    2)Hypotension  blood pressure stable   3)Anemia of chronic disease  HGb is not at goal (9--11) Patient is on Epogen during dialysis  4) secondary hyperparathyroidism -CKD Mineral-Bone Disorder   Secondary Hyperparathyroidism -present.  Phosphorus at goal.   5) atrial fibrillation with hypotension Patient is on metoprolol for rate control and midodrine for blood pressure   6) electrolytes   sodium Normonatremic   potassium Normokalemic    7)Acid base Co2 at goal  8) bilateral pleural effusion Patient is s/p thoracentesis on right side on July 21 and on the left side on July 23   Plan:   No need for HD today  We will dialyze in the morning    Blake [name] s Theador Hawthorne 02/10/2020, 10:32 AM

## 2020-02-10 NOTE — Plan of Care (Signed)
Continuing with plan of care. 

## 2020-02-11 ENCOUNTER — Inpatient Hospital Stay: Payer: Medicare PPO

## 2020-02-11 LAB — CBC WITH DIFFERENTIAL/PLATELET
Abs Immature Granulocytes: 0.01 10*3/uL (ref 0.00–0.07)
Basophils Absolute: 0 10*3/uL (ref 0.0–0.1)
Basophils Relative: 1 %
Eosinophils Absolute: 0.1 10*3/uL (ref 0.0–0.5)
Eosinophils Relative: 2 %
HCT: 27.2 % — ABNORMAL LOW (ref 39.0–52.0)
Hemoglobin: 8.5 g/dL — ABNORMAL LOW (ref 13.0–17.0)
Immature Granulocytes: 0 %
Lymphocytes Relative: 10 %
Lymphs Abs: 0.5 10*3/uL — ABNORMAL LOW (ref 0.7–4.0)
MCH: 28.3 pg (ref 26.0–34.0)
MCHC: 31.3 g/dL (ref 30.0–36.0)
MCV: 90.7 fL (ref 80.0–100.0)
Monocytes Absolute: 0.4 10*3/uL (ref 0.1–1.0)
Monocytes Relative: 8 %
Neutro Abs: 4.2 10*3/uL (ref 1.7–7.7)
Neutrophils Relative %: 79 %
Platelets: 95 10*3/uL — ABNORMAL LOW (ref 150–400)
RBC: 3 MIL/uL — ABNORMAL LOW (ref 4.22–5.81)
RDW: 17.4 % — ABNORMAL HIGH (ref 11.5–15.5)
WBC: 5.2 10*3/uL (ref 4.0–10.5)
nRBC: 0 % (ref 0.0–0.2)

## 2020-02-11 LAB — GLUCOSE, CAPILLARY
Glucose-Capillary: 103 mg/dL — ABNORMAL HIGH (ref 70–99)
Glucose-Capillary: 89 mg/dL (ref 70–99)
Glucose-Capillary: 102 mg/dL — ABNORMAL HIGH (ref 70–99)
Glucose-Capillary: 147 mg/dL — ABNORMAL HIGH (ref 70–99)

## 2020-02-11 LAB — BASIC METABOLIC PANEL
Anion gap: 10 (ref 5–15)
BUN: 54 mg/dL — ABNORMAL HIGH (ref 8–23)
CO2: 30 mmol/L (ref 22–32)
Calcium: 7.8 mg/dL — ABNORMAL LOW (ref 8.9–10.3)
Chloride: 94 mmol/L — ABNORMAL LOW (ref 98–111)
Creatinine, Ser: 5.96 mg/dL — ABNORMAL HIGH (ref 0.61–1.24)
GFR calc Af Amer: 9 mL/min — ABNORMAL LOW (ref >60)
GFR calc non Af Amer: 7 mL/min — ABNORMAL LOW (ref >60)
Glucose, Bld: 111 mg/dL — ABNORMAL HIGH (ref 70–99)
Potassium: 4.3 mmol/L (ref 3.5–5.1)
Sodium: 134 mmol/L — ABNORMAL LOW (ref 135–145)

## 2020-02-11 LAB — SARS CORONAVIRUS 2 BY RT PCR (HOSPITAL ORDER, PERFORMED IN ~~LOC~~ HOSPITAL LAB): SARS Coronavirus 2: NEGATIVE

## 2020-02-11 MED ORDER — DILTIAZEM HCL 30 MG PO TABS
30.0000 mg | ORAL_TABLET | Freq: Once | ORAL | Status: AC
  Administered 2020-02-11: 30 mg via ORAL
  Filled 2020-02-11: qty 1

## 2020-02-11 MED ORDER — FERROUS SULFATE 325 (65 FE) MG PO TABS: 325.0000 mg | ORAL_TABLET | Freq: Every day | ORAL | 0 refills | Status: AC

## 2020-02-11 MED ORDER — METOPROLOL TARTRATE 50 MG PO TABS: 50.0000 mg | ORAL_TABLET | Freq: Two times a day (BID) | ORAL | 0 refills | Status: AC

## 2020-02-11 MED ORDER — MIDODRINE HCL 5 MG PO TABS: 5.0000 mg | ORAL_TABLET | Freq: Three times a day (TID) | ORAL | 0 refills | Status: AC

## 2020-02-11 MED ORDER — ASCORBIC ACID 250 MG PO TABS: 250.0000 mg | ORAL_TABLET | Freq: Every day | ORAL | 0 refills | Status: AC

## 2020-02-11 MED ORDER — ALBUMIN HUMAN 25 % IV SOLN
12.5000 g | Freq: Once | INTRAVENOUS | Status: AC
  Administered 2020-02-11: 12.5 g via INTRAVENOUS
  Filled 2020-02-11: qty 50

## 2020-02-11 MED ORDER — RENA-VITE PO TABS: 1.0000 | ORAL_TABLET | Freq: Every day | ORAL | 0 refills | Status: AC

## 2020-02-11 NOTE — Progress Notes (Signed)
Cross Cover Brief Note  Patient was seen and examined as request by EMS transport secondary to pulse reading in the 40's.  BP stable Patient was arousable.  Tele monitor with atrial fib but variable rates 60's - 90's. Patient denied pain.  Oxygen saturations stable on O2 2L [place]. CBG done, no hypoglycemia.  Upper airway rhonchi - course, not knew for patient Weak cough efforts with inability to clear

## 2020-02-11 NOTE — Progress Notes (Signed)
Patient discharged via EMS to [facility]. Daughter was present for the transfer. IV had been previously removed. Discharge papers given to EMS for facility. Cory Roughen, RN had called facility earlier to give report.

## 2020-02-11 NOTE — Progress Notes (Signed)
This note also relates to the following rows which could not be included: Pulse Rate - Cannot attach notes to unvalidated device data Resp - Cannot attach notes to unvalidated device data  Hd completed  

## 2020-02-11 NOTE — Care Management Important Message (Signed)
Important Message  Patient Details  Name: HADDON FYFE MRN: [id number] Date of Birth: 1922-12-16   Medicare Important Message Given:  Yes     Juliann Pulse A Peachie Barkalow 02/11/2020, 1:28 PM

## 2020-02-11 NOTE — Progress Notes (Signed)
This note also relates to the following rows which could not be included: Pulse Rate - Cannot attach notes to unvalidated device data Resp - Cannot attach notes to unvalidated device data  Hd started  

## 2020-02-11 NOTE — Progress Notes (Signed)
EMS has been called for the patient and it was mentioned he was second in the line. Daughter would like to be call when the patient gets picked up from EMS.

## 2020-02-11 NOTE — TOC Initial Note (Addendum)
Transition of Care [facility]) - Initial/Assessment Note    Patient Details  Name: Blake [name] MRN: [id number] Date of Birth: [birth year]  Transition of [facility]) CM/SW Contact:    Beverly Sessions, RN Phone Number: 02/11/2020, 1:27 PM  Clinical Narrative:                   Patient to discharge to [facility] today RNCM spoke with daughter and confirmed plans.  Bedside RN to contact daughter when EMS arrives   DC info sent in the hub Patient currently in HD now.  After session completed the bedside RN to perform covid test  After covid test has resulted and negative then EMS can be called.  EMS packet on the chart  Elvera Bicker dialysis liaison notified of discharge    Update:  Repeat covid swab negative.  Bedside RN to call EMS. He has updated daughter  Expected Discharge Plan: [place] Barriers to Discharge: No Barriers Identified   Patient Goals and CMS Choice        Expected Discharge Plan and Services Expected Discharge Plan: [place]       Living arrangements for the past 2 months: Single Family Home Expected Discharge Date: 02/11/20                                    Prior Living Arrangements/Services Living arrangements for the past 2 months: Single Family Home Lives with:: Adult Children   Do you feel safe going back to the place where you live?: Yes      Need for Family Participation in Patient Care: Yes (Comment) Care giver support system in place?: Yes (comment) Current home services: DME Criminal Activity/Legal Involvement Pertinent to Current Situation/Hospitalization: No - Comment as needed  Activities of Daily Living Home Assistive Devices/Equipment: Raised toilet seat with rails, Bedside commode/3-in-1, Wheelchair, Environmental consultant (specify type) ADL Screening (condition at time of admission) Patient's cognitive ability adequate to safely complete daily activities?: No Is the patient deaf or have  difficulty hearing?: Yes (right side) Does the patient have difficulty seeing, even when wearing glasses/contacts?: Yes (degeneration of eyes) Does the patient have difficulty concentrating, remembering, or making decisions?: Yes Patient able to express need for assistance with ADLs?: No Does the patient have difficulty dressing or bathing?: Yes Independently performs ADLs?: No Communication: Needs assistance Is this a change from baseline?: Pre-admission baseline Dressing (OT): Needs assistance Is this a change from baseline?: Pre-admission baseline Grooming: Needs assistance Is this a change from baseline?: Pre-admission baseline Feeding: Needs assistance Is this a change from baseline?: Pre-admission baseline Bathing: Needs assistance Is this a change from baseline?: Pre-admission baseline Toileting: Needs assistance Is this a change from baseline?: Pre-admission baseline In/Out Bed: Needs assistance Is this a change from baseline?: Pre-admission baseline Walks in Home: Needs assistance Is this a change from baseline?: Pre-admission baseline Does the patient have difficulty walking or climbing stairs?: Yes Weakness of Legs: Both Weakness of Arms/Hands: Both  Permission Sought/Granted                  Emotional Assessment       Orientation: : Fluctuating Orientation (Suspected and/or reported Sundowners)   Psych Involvement: No (comment)  Admission diagnosis:  Weakness [R53.1] SOB (shortness of breath) [R06.02] Acute renal failure superimposed on stage 4 chronic kidney disease ([place]) [N17.9, N18.4] Acute renal failure superimposed on  chronic kidney disease, unspecified CKD stage, unspecified acute renal failure type ([place]) [N17.9, N18.9] Patient Active Problem List   Diagnosis Date Noted  . AF (paroxysmal atrial fibrillation) ([place])   . Non-sustained ventricular tachycardia ([place])   . Drainage from wound   . Tachycardia   . Weakness   . ESRD (end stage renal  disease) ([place])   . Protein-calorie malnutrition, severe 01/30/2020  . Pleural effusion   . Pressure injury of buttock, stage 2 ([place])   . HTN (hypertension) 01/23/2020  . Type II diabetes mellitus with renal manifestations ([place]) 01/23/2020  . Acute respiratory failure with hypoxia ([place]) 01/23/2020  . Normocytic anemia 01/23/2020  . Acute metabolic encephalopathy   . Urinary tract infection in elderly patient 11/04/2019  . AMS (altered mental status) 09/24/2019  . Acute renal failure superimposed on stage 4 chronic kidney disease ([place]) 09/24/2019  . Anemia 09/24/2019  . OSA (obstructive sleep apnea) 09/24/2019  . Seizure ([place]) 01/03/2019  . Acute on chronic diastolic CHF (congestive heart failure) ([place]) 12/25/2018  . Acute renal failure (ARF) ([place]) 11/21/2018  . AKI (acute kidney injury) ([place])   . Goals of care, counseling/discussion   . Palliative care by specialist   . DNR (do not resuscitate) discussion   . Seizure-like activity ([place]) 11/05/2018  . Allergic rhinitis 07/24/2018  . Cardiac pacemaker in situ 07/24/2018  . History of sleep apnea 07/24/2018  . Macular degeneration 07/24/2018  . Trigeminal neuralgia pain 01/11/2018  . TMJ pain dysfunction syndrome 01/11/2018  . Recurrent UTI 09/26/2017  . Urothelial carcinoma of bladder ([place]) 09/26/2017  . Pressure injury, stage 3 ([place]) 09/12/2017  . SIRS (systemic inflammatory response syndrome) ([place]) 09/10/2017  . Chronic cystitis 08/25/2017  . Sepsis ([place]) 06/10/2017  . CAP (community acquired pneumonia) 06/10/2017  . Infection due to enterococcus 06/07/2017  . Bleeding   . Hemorrhage of rectum and anus   . Lower GI bleed 10/20/2016  . Near syncope   . Acute renal failure superimposed on chronic kidney disease ([place])   . Pneumonia 10/09/2016  . Aspiration pneumonia ([place]) 10/09/2016  . Acute respiratory failure ([place]) 10/09/2016  . Hydronephrosis, bilateral 07/19/2016  . Spinal stenosis of lumbar region with neurogenic  claudication 07/19/2016  . AAA (abdominal aortic aneurysm) without rupture ([place]) 04/27/2016  . Arthritis, degenerative 03/31/2016  . Atrial fibrillation with RVR ([place]) 03/31/2016  . HLD (hyperlipidemia) 03/31/2016  . CA of prostate ([place]) 03/31/2016  . Central perforation of tympanic membrane of right ear 03/26/2016  . 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 Presbycusis of left ear with restricted hearing of right ear 03/01/2016  . Chronic kidney disease, stage 3 (moderate) 10/30/2015  . Episode of unresponsiveness 10/30/2015  . Sick sinus syndrome ([place]) 10/30/2015  . Thrombocytopenia ([place]) 07/24/2015  . Fitting or adjustment of cardiac pacemaker 05/09/2015  . SVT (supraventricular tachycardia) ([place]) 04/09/2015  . Diabetes mellitus, type 2 ([place]) 04/09/2015  . Benign hypertension 04/09/2015  . Acute myocardial infarction, initial episode of care ([place]) 04/09/2015  . Phimosis 01/30/2014  . Bladder outlet obstruction 04/18/2013  . Dysuria 04/18/2013  . Gross hematuria 04/18/2013  . Incomplete emptying of bladder 11/26/2012  . Personal history of prostate cancer 11/26/2012   PCP:  Adin Hector, MD Pharmacy:   [facility], [place] [address] Phone: [phone] Fax: [place], [place] -  [address] [place] [address] Phone: [phone] Fax: [phone]  [facility] [place], [place] [address] Phone: [phone] Fax: [phone]  [id number] [address], [place] [place] [address] Phone: [phone] Fax: [phone]     Social Determinants of Health (SDOH) Interventions    Readmission Risk Interventions Readmission Risk Prevention Plan 02/11/2020 01/25/2020  09/25/2019  Transportation Screening Complete Complete Complete  PCP or Specialist Appt within 3-5 Days - - Complete  HRI or [place] - - Complete  Social Work Consult for [place] Planning/Counseling - - Complete  Palliative Care Screening - - Not Applicable  Medication Review (RN Care Manager) Complete Complete Referral to Pharmacy  HRI or Home Care Consult - Complete -  Palliative Care Screening Complete - -  [place] Complete Patient Refused -  Some recent data might be hidden

## 2020-02-11 NOTE — Progress Notes (Signed)
[facility]  ROUNDING NOTE   Subjective:  Patient seen and evaluated during hemodialysis. Tolerating well. Currently awake, alert, conversant.   Objective:  Vital signs in last 24 hours:  Temp:  [97.6 F (36.4 C)-98.1 F (36.7 C)] 98.1 F (36.7 C) (08/02 1240) Pulse Rate:  [64-132] 131 (08/02 1245) Resp:  [19-34] 25 (08/02 1245) BP: (117-135)/(82-104) 124/91 (08/02 1245) SpO2:  [85 %-100 %] 96 % (08/02 0455)  Weight change:  Filed Weights   02/04/20 0500 02/06/20 0458 02/09/20 0500  Weight: (!) 92.2 kg (!) 95.7 kg (!) 96 kg    Intake/Output: I/O last 3 completed shifts: In: 56 [P.O.:477] Out: 240 [Urine:240]   Intake/Output this shift:  Total I/O In: -  Out: 1000 [Other:1000]  Physical Exam: General: NAD, sitting up in chair  Head: Temporal wasting noted, edentulous  Eyes: Anicteric  Lungs:  Bilateral rhonchi, normal effort  Heart: irregular   Abdomen:  Soft, nontender, bowel sounds present  Extremities: 1+ lower extremity edema  Neurologic: Awake, alert, conversant  Access: Right IJ PermCath.  Placed July 25.    Basic Metabolic Panel: Recent Labs  Lab 02/07/20 0540 02/07/20 0540 02/08/20 0519 02/08/20 0519 02/09/20 0554 02/10/20 0503 02/11/20 0918  NA 139  --  138  --  138 136 134*  K 4.3  --  4.9  --  4.8 4.3 4.3  CL 96*  --  98  --  95* 94* 94*  CO2 31  --  29  --  31 28 30   GLUCOSE 130*  --  126*  --  130* 111* 111*  BUN 35*  --  43*  --  30* 43* 54*  CREATININE 4.59*  --  5.53*  --  4.46* 5.23* 5.96*  CALCIUM 7.7*   < > 7.9*   < > 8.1* 8.1* 7.8*   < > = values in this interval not displayed.    Liver Function Tests: No results for input(s): AST, ALT, ALKPHOS, BILITOT, PROT, ALBUMIN in the last 168 hours. No results for input(s): LIPASE, AMYLASE in the last 168 hours. No results for input(s): AMMONIA in the last 168 hours.  CBC: Recent Labs  Lab 02/05/20 0427 02/06/20 1053 02/07/20 0540 02/08/20 0519 02/11/20 0918   WBC 6.3 5.7 5.3 5.8 5.2  NEUTROABS  --  4.5 4.1 4.5 4.2  HGB 8.8* 8.5* 8.2* 8.1* 8.5*  HCT 26.9* 26.3* 25.2* 26.2* 27.2*  MCV 88.5 88.3 88.4 91.6 90.7  PLT 106* 96* 83* 84* 95*    Cardiac Enzymes: No results for input(s): CKTOTAL, CKMB, CKMBINDEX, TROPONINI in the last 168 hours.  BNP: Invalid input(s): POCBNP  CBG: Recent Labs  Lab 02/10/20 0743 02/10/20 1207 02/10/20 1704 02/10/20 2109 02/11/20 0754  GLUCAP 98 134* 137* 156* 103*    Microbiology: Results for orders placed or performed during the hospital encounter of 01/23/20  Urine culture     Status: Abnormal   Collection Time: 01/23/20  9:40 AM   Specimen: Urine, Random  Result Value Ref Range Status   Specimen Description   Final    URINE, RANDOM Performed at [facility], [address] [place], [place] [address]    Special Requests   Final    NONE Performed at [facility], [address] [place],  [address]    Culture 70,000 COLONIES/mL STAPHYLOCOCCUS EPIDERMIDIS (A)  Final   Report Status 01/26/2020 FINAL  Final   Organism ID, Bacteria STAPHYLOCOCCUS EPIDERMIDIS (A)  Final  Susceptibility   Staphylococcus epidermidis - MIC*    CIPROFLOXACIN >=8 RESISTANT Resistant     GENTAMICIN <=0.5 SENSITIVE Sensitive     NITROFURANTOIN <=16 SENSITIVE Sensitive     OXACILLIN >=4 RESISTANT Resistant     TETRACYCLINE 2 SENSITIVE Sensitive     VANCOMYCIN 1 SENSITIVE Sensitive     TRIMETH/SULFA 80 RESISTANT Resistant     CLINDAMYCIN <=0.25 SENSITIVE Sensitive     RIFAMPIN <=0.5 SENSITIVE Sensitive     Inducible Clindamycin NEGATIVE Sensitive     * 70,000 COLONIES/mL STAPHYLOCOCCUS EPIDERMIDIS  SARS Coronavirus 2 by RT PCR (hospital order, performed in [place] hospital lab) Nasopharyngeal Nasopharyngeal Swab     Status: None   Collection Time: 01/23/20  4:00 PM   Specimen: Nasopharyngeal Swab  Result Value Ref Range Status   SARS Coronavirus 2 NEGATIVE NEGATIVE Final     Comment: (NOTE) SARS-CoV-2 target nucleic acids are NOT DETECTED.  The SARS-CoV-2 RNA is generally detectable in upper and lower respiratory specimens during the acute phase of infection. The lowest concentration of SARS-CoV-2 viral copies this assay can detect is 250 copies / mL. A negative result does not preclude SARS-CoV-2 infection and should not be used as the sole basis for treatment or other patient management decisions.  A negative result may occur with improper specimen collection / handling, submission of specimen other than nasopharyngeal swab, presence of viral mutation(s) within the areas targeted by this assay, and inadequate number of viral copies (<250 copies / mL). A negative result must be combined with clinical observations, patient history, and epidemiological information.  Fact Sheet for Patients:   StrictlyIdeas.no  Fact Sheet for Healthcare Providers: BankingDealers.co.za  This test is not yet approved or  cleared by the [place] FDA and has been authorized for detection and/or diagnosis of SARS-CoV-2 by FDA under an Emergency Use Authorization (EUA).  This EUA will remain in effect (meaning this test can be used) for the duration of the COVID-19 declaration under Section 564(b)(1) of the Act, 21 U.S.C. section 360bbb-3(b)(1), unless the authorization is terminated or revoked sooner.  Performed at [facility], [address] [place], [place] [address]   CULTURE, BLOOD (ROUTINE X 2) w Reflex to ID Panel     Status: None   Collection Time: 01/23/20  5:13 PM   Specimen: BLOOD  Result Value Ref Range Status   Specimen Description BLOOD BLOOD LEFT HAND  Final   Special Requests   Final    BOTTLES DRAWN AEROBIC AND ANAEROBIC Blood Culture adequate volume   Culture   Final    NO GROWTH 5 DAYS Performed at [facility], [address] [place], [place] [address]    Report Status  01/28/2020 FINAL  Final  CULTURE, BLOOD (ROUTINE X 2) w Reflex to ID Panel     Status: None   Collection Time: 01/23/20  6:12 PM   Specimen: BLOOD  Result Value Ref Range Status   Specimen Description BLOOD BLOOD LEFT WRIST  Final   Special Requests Blood Culture adequate volume  Final   Culture   Final    NO GROWTH 5 DAYS Performed at [facility], [address] [place], [place] [address]    Report Status 01/28/2020 FINAL  Final  MRSA PCR Screening     Status: Abnormal   Collection Time: 01/25/20  8:17 AM   Specimen: Nasopharyngeal  Result Value Ref Range Status   MRSA by PCR POSITIVE (A) NEGATIVE Final  Comment:        The GeneXpert MRSA Assay (FDA approved for NASAL specimens only), is one component of a comprehensive MRSA colonization surveillance program. It is not intended to diagnose MRSA infection nor to guide or monitor treatment for MRSA infections. RESULT CALLED TO, READ BACK BY AND VERIFIED WITH: MARIA [facility] 01/25/20 1945 SJL Performed at [facility], [address] [place], [place] [address]   Body fluid culture     Status: None   Collection Time: 01/30/20  2:40 PM   Specimen: PATH Cytology Pleural fluid  Result Value Ref Range Status   Specimen Description   Final    PLEURAL Performed at [facility], [address] [place], [place] [address]    Special Requests   Final    NONE Performed at [facility], [address] [place], [place] [address]    Gram Stain   Final    RARE WBC PRESENT, PREDOMINANTLY MONONUCLEAR NO ORGANISMS SEEN    Culture   Final    NO GROWTH 3 DAYS Performed at [facility], [place] [address] [place], [place] [address]    Report Status 02/03/2020 FINAL  Final  Urine Culture     Status: Abnormal   Collection Time: 01/31/20 10:30 AM   Specimen: Urine, Random  Result Value Ref Range Status   Specimen Description   Final    URINE, RANDOM Performed at [facility],  [address], [place], [place] [address]    Special Requests   Final    Normal Performed at [facility], [address] [place], [place] [address]    Culture MULTIPLE SPECIES PRESENT, SUGGEST RECOLLECTION (A)  Final   Report Status 02/01/2020 FINAL  Final  Body fluid culture     Status: None   Collection Time: 02/01/20  2:00 PM   Specimen: PATH Cytology Pleural fluid  Result Value Ref Range Status   Specimen Description   Final    PLEURAL Performed at [facility], [address] [place], [place] [address]    Special Requests   Final    NONE Performed at [facility], [place], [place] [address]    Gram Stain NO WBC SEEN NO ORGANISMS SEEN   Final   Culture   Final    NO GROWTH 3 DAYS Performed at [facility], [place] [address], [place],  [address]    Report Status 02/05/2020 FINAL  Final  MRSA PCR Screening     Status: Abnormal   Collection Time: 02/06/20 10:02 AM   Specimen: Nasopharyngeal  Result Value Ref Range Status   MRSA by PCR POSITIVE (A) NEGATIVE Final    Comment:        The GeneXpert MRSA Assay (FDA approved for NASAL specimens only), is one component of a comprehensive MRSA colonization surveillance program. It is not intended to diagnose MRSA infection nor to guide or monitor treatment for MRSA infections. RESULT CALLED TO, READ BACK BY AND VERIFIED WITH: MORALES JORGE AT 7412 ON 02/06/2020 BY Epifanio Lesches S Performed at [facility], [address] [place],  [address]     Coagulation Studies: No results for input(s): LABPROT, INR in the last 72 hours.  Urinalysis: No results for input(s): COLORURINE, LABSPEC, PHURINE, GLUCOSEU, HGBUR, BILIRUBINUR, KETONESUR, PROTEINUR, UROBILINOGEN, NITRITE, LEUKOCYTESUR in the last 72 hours.  Invalid input(s): APPERANCEUR    Imaging: DG Chest Port 1 View  Result Date: 02/11/2020 CLINICAL DATA:  Hypoxia EXAM: PORTABLE CHEST 1 VIEW  COMPARISON:  02/08/2020 FINDINGS: Left pacer remains in place, unchanged. Aortic atherosclerosis. Right dialysis catheter is unchanged. Moderate to large right pleural effusion and small left pleural effusion. Bilateral airspace disease, diffusely throughout the right lung and in the left lower lobe, similar to prior study. IMPRESSION: No significant change since prior study. Electronically Signed   By: Rolm Baptise M.D.   On: 02/11/2020 09:03     Medications:   . sodium chloride Stopped (02/04/20 1839)   . ferrous sulfate  325 mg Oral Q breakfast   And  . vitamin C  250 mg Oral Q breakfast  . azelastine  1 spray Each Nare BID  . Chlorhexidine Gluconate Cloth  6 each Topical Q0600  . epoetin (EPOGEN/PROCRIT) injection  10,000 Units Intravenous Q T,Th,Sa-HD  . feeding supplement (ENSURE ENLIVE)  237 mL Oral TID BM  . finasteride  5 mg Oral Daily  . insulin aspart  0-5 Units Subcutaneous QHS  . insulin aspart  0-9 Units Subcutaneous TID WC  . ipratropium-albuterol  3 mL Nebulization BID  . mouth rinse  15 mL Mouth Rinse BID  . metoprolol tartrate  50 mg Oral BID  . midodrine  5 mg Oral TID WC  . multivitamin  1 tablet Oral QHS  . pantoprazole  40 mg Oral Daily  . rosuvastatin  5 mg Oral QHS  . tamsulosin  0.4 mg Oral Daily   sodium chloride, acetaminophen, ammonium lactate, HYDROmorphone (DILAUDID) injection, ipratropium-albuterol, metoprolol tartrate, ondansetron (ZOFRAN) IV  Assessment/ Plan:  [name] is a 86 y.o. black male with acute renal failure on hemodialysis, hypertension, hyperlipidemia, diabetes mellitus type 2, GERD, obstructive sleep apnea, urothelial carcinoma of the bladder status post bilateral nephrostomy tube placement, permanent pacemaker placement secondary to sick sinus syndrome, SVT, who is admitted to [facility] on 01/23/2020 for Weakness [R53.1] SOB (shortness of breath) [R06.02] Acute renal failure superimposed on stage 4 chronic kidney disease (HCC)  [N17.9, N18.4] Acute renal failure superimposed on chronic kidney disease, unspecified CKD stage, unspecified acute renal failure type ([place]) [N17.9, N18.9] Patient initiated on hemodialysis on 7/17 for uremic symptoms.   1. End Stage Renal Disease: Patient seen and evaluated during hemodialysis treatment today.  Tolerating quite well.  We plan to complete the dialysis treatment today.  He will be discharging to [facility] and will be undergoing dialysis at [facility].    2. Anemia of chronic kidney disease.   Lab Results  Component Value Date   HGB 8.5 (L) 02/11/2020  Continue Epogen while an inpatient he will need he will likely switch to [facility] as an outpatient.    3.  Atrial fibrillation with rapid ventricular response and hypotension -Patient still tachycardic.  Follow cardiology recommendations.  4. Bilateral pleural effusions: status post Right thoracentesis on 7/21 with 2.4 liters removed.  Left thoracentesis performed 02/01/2020.   LOS: 19 Roi Jafari 8/2/20211:08 PM

## 2020-02-11 NOTE — Discharge Summary (Signed)
Physician Discharge Summary  Blake [name] KKX:[id number] DOB: May 01, 1923 DOA: 01/23/2020  PCP: Adin Hector, MD  Admit date: 01/23/2020 Discharge date: 02/11/2020  Admitted From: Home Disposition: SNF[facility]  Recommendations for Outpatient Follow-up:  1. Follow up with PCP in 1-2 weeks 2. Follow-up with cardiology as directed by Dr. Bethanne Ginger office 3. Nephrology will follow you at the skilled nursing facility and outpatient dialysis  Home Health: No Equipment/Devices: Oxygen 2 L, permacath Discharge Condition: Stable CODE STATUS: Full Diet recommendation: Heart Healthy / Carb Modified Brief/Interim Summary: [name] Blake [name] a 86 y.o.malewith medical history significant ofhypertension, hyperlipidemia, diabetes mellitus, GERD, OSA, prostate cancer, urothelial carcinoma of the bladder, s/p of bilateral nephrostomy, pacemaker placement due to SSS, GI bleeding,dCHF, SVT,CKD-IV,thrombocytopenia, chronic respiratory failure on 2 L nasal cannula oxygen at night at home, who presents with decreased urine output.  Per his son and daughter, pt has hx ofurothelial carcinoma of the bladderand iss/p of bilateral nephrostomy. Pt was noted to havedecreased urine output at home. Not sure if pt hassymptoms of UTI. Does not seem to have hematuria. Pt is using 2Loxygen at night at home, but not in the daytime. Patient was found to have oxygen desaturated to 88% on room air, which improved to 95 to 96% on 2 L nasal cannula oxygenin ED.patient seems to have mild shortness breath, but no cough, chest pain.He hasleft lower leg edema. No fever or chills. Patient has little loose stool bowel movement recently, but no active diarrhea, nausea, vomiting, abdominal pain per his son. Per EDP, pt recently had cough and URI. Also reportedly recently finished antibiotics.Pt knows his own name, but not oriented to place and time.  7/28: Patient seen and examined.  Care assumed from  previous hospitalist today.  Overall my evaluation patient is a little lethargic but otherwise in no acute distress.  He is scheduled for hemodialysis today.  He remains on 2 L nasal cannula.  7/29: Patient seen and examined.  Lethargic this morning.  Patient son is at bedside.  Upon prompting patient is able to open his eyes and does respond verbally to questioning.  Remains on 2 L nasal cannula.  7/30: Patient seen and examined.  Completed hemodialysis this morning.  Lethargic on my arrival but does awaken.  Rather difficult to understand without bottom teeth and.  Remains on 2 L nasal cannula.  Does not appear in any distress.  7/31: Patient seen and examined.  Much more awake this morning.  Answers all questions appropriately.  Remains on 2 L nasal cannula.  No distress noted.  Communicated with case management, patient has a bed at [facility] however they are unable to acquire medications until Monday so patient cannot discharge until that time.  He is currently medically stable for discharge.  8/1: Patient seen and examined.  Sleeping on my arrival this morning but easily awakened.  Remains on 2 L nasal cannula.  No respiratory distress noted.  Seen by nephrology today.  No emergent need for hemodialysis.  Tentative plan to discharge tomorrow.  Tentative plan to discharge to skilled nursing facility tomorrow 02/11/20.  8/2: Patient seen and examined.  Underwent hemodialysis successfully in the hospital.  Examined during dialysis today.  Feeling well.  Stable for discharge to skilled nursing facility this afternoon.  Communicated with case Dance movement psychotherapist.  Discharge medications as below.  Discharge Diagnoses:  Principal Problem:   Acute renal failure superimposed on stage 4 chronic kidney disease (HCC) Active Problems:   SVT (  supraventricular tachycardia) (HCC)   Atrial fibrillation with RVR (HCC)   HLD (hyperlipidemia)   Bleeding   Pressure injury, stage 3 (HCC)   Recurrent  UTI   Urothelial carcinoma of bladder (HCC)   Thrombocytopenia (HCC)   Acute on chronic diastolic CHF (congestive heart failure) (HCC)   HTN (hypertension)   Type II diabetes mellitus with renal manifestations (HCC)   Acute respiratory failure with hypoxia (HCC)   Normocytic anemia   Protein-calorie malnutrition, severe   Pleural effusion   Pressure injury of buttock, stage 2 (HCC)   ESRD (end stage renal disease) (HCC)   Drainage from wound   Tachycardia   Weakness   Non-sustained ventricular tachycardia (HCC)   AF (paroxysmal atrial fibrillation) (HCC)  End-stage renal disease on hemodialysis Bilateral nephrostomy tubes Patient has permacath in place Patient was able to tolerate dialysis well sitting up No bleeding noted from PermCath site Per nephrology patient does have an outpatient HD chair Outpatient HD cannot start till next Tuesday 02/12/20 Patient received hemodialysis in-house on 02/11/20 Stable for discharge to skilled nursing facility [facility] kidney nephrology group will follow patient and [facility] as well as outpatient hemodialysis  Bilateral pleural effusions Status post right thoracentesis on 01/30/2020, 2400 cc fluid removed Status post left thoracentesis 02/01/2020, 750 cc removed No indication for repeat thoracentesis at this time Outpatient dialysis schedule as above  Paroxysmal atrial fibrillation Supraventricular tachycardia NSVT Heart rate stable over interval Continue metoprolol twice daily Patient to follow-up with Dr. Ubaldo Glassing from cardiology if desired No anticoagulation at this time  Acute hypoxic respiratory failure Currently on 2 L oxygen Dialysis as above Wean oxygen as tolerated Unable to wean from supplemental oxygen prior to discharge Can discharge on submental oxygen Repeat attempt to wean after continued hemodialysis  Acute on chronic diastolic congestive heart failure HD for fluid removal  Thrombocytopenia Platelet  count responded to platelet transfusion No pericatheter bleeding noted Outpatient follow-up with repeat labs in 1 week  Chronic decubitus ulcer Present on admission Stage II on sacrum WC consult  Weakness Functional decline PT evaluation ordered, recommend skilled nursing facility  Right nephrostomy tube drainage Evaluated by IR Tube not exchanged Was empirically started on vancomycin and Zosyn De-escalate vancomycin and Augmentin Completed 7-day course of antibiotics No further need for antibiotics  Discharge Instructions  Discharge Instructions    Diet - low sodium heart healthy   Complete by: As directed    Increase activity slowly   Complete by: As directed    No wound care   Complete by: As directed      Allergies as of 02/11/2020      Reactions   Cefepime    seizure   Diovan [valsartan] Cough   Gabapentin Other (See Comments)   Sedation at all doses   Hydralazine Itching   Lisinopril Cough   Pregabalin Other (See Comments)   Sedation at all doses   Levofloxacin Other (See Comments)   Too many side effects. Nausea, vomiting, upset stomach, increased confusion, etc Other reaction(s): Confusion Nausea, chills Nausea, chills   Sulfamethoxazole-trimethoprim Other (See Comments)   Too many side effects. Nausea, vomiting, upset stomach, increased confusion, etc      Medication List    STOP taking these medications   ciprofloxacin 250 MG tablet Commonly known as: CIPRO   multivitamin-iron-minerals-folic acid chewable tablet   PreserVision AREDS Caps   sodium bicarbonate 650 MG tablet   torsemide 20 MG tablet Commonly known as: DEMADEX  TAKE these medications   ascorbic acid 250 MG tablet Commonly known as: VITAMIN C Take 1 tablet (250 mg total) by mouth daily with breakfast.   azelastine 0.1 % nasal spray Commonly known as: ASTELIN SMARTSIG:1 Spray(s) Both Nares Twice Daily PRN   feeding supplement (GLUCERNA SHAKE) Liqd Take 237 mLs by  mouth 2 (two) times daily between meals.   ferrous sulfate 325 (65 FE) MG tablet Take 1 tablet (325 mg total) by mouth daily with breakfast.   finasteride 5 MG tablet Commonly known as: PROSCAR Take 5 mg by mouth daily.   metoprolol tartrate 50 MG tablet Commonly known as: LOPRESSOR Take 1 tablet (50 mg total) by mouth 2 (two) times daily. What changed:   medication strength  how much to take   midodrine 5 MG tablet Commonly known as: PROAMATINE Take 1 tablet (5 mg total) by mouth 3 (three) times daily with meals.   multivitamin Tabs tablet Take 1 tablet by mouth at bedtime.   pantoprazole 40 MG tablet Commonly known as: PROTONIX Take 40 mg by mouth daily.   rosuvastatin 5 MG tablet Commonly known as: CRESTOR Take 5 mg by mouth at bedtime.   sitaGLIPtin 50 MG tablet Commonly known as: JANUVIA Take 0.5 tablets (25 mg total) by mouth daily.   tamsulosin 0.4 MG Caps capsule Commonly known as: FLOMAX Take 0.4 mg by mouth daily.   VITRON-C PO Take 1 tablet by mouth daily.            Durable Medical Equipment  (From admission, onward)         Start     Ordered   02/01/20 1501  For home use only DME Hospital bed  Once       Question Answer Comment  Length of Need Lifetime   Patient has (list medical condition): CHF, Pleural effusions bilaterally, ESRD   The above medical condition requires: Patient requires the ability to reposition frequently   Head must be elevated greater than: 30 degrees   Bed type Semi-electric   [employer] Yes   Trapeze Bar Yes   Support Surface: Gel Overlay      02/01/20 1501          Contact information for after-discharge care    [facility] .   Service: Skilled Nursing Contact information: [place] [phone]                 Allergies  Allergen Reactions  . Cefepime     seizure  . Diovan [Valsartan] Cough  . Gabapentin  Other (See Comments)    Sedation at all doses  . Hydralazine Itching  . Lisinopril Cough  . Pregabalin Other (See Comments)    Sedation at all doses  . Levofloxacin Other (See Comments)    Too many side effects. Nausea, vomiting, upset stomach, increased confusion, etc Other reaction(s): Confusion Nausea, chills Nausea, chills   . Sulfamethoxazole-Trimethoprim Other (See Comments)    Too many side effects. Nausea, vomiting, upset stomach, increased confusion, etc    Consultations:  Nephrology  Vascular surgery  Cardiology  Palliative care   Procedures/Studies: DG Chest 1 View  Result Date: 01/30/2020 CLINICAL DATA:  Follow-up right thoracentesis EXAM: CHEST  1 VIEW COMPARISON:  01/29/2020 FINDINGS: Dual lead pacemaker unchanged. Cardiomegaly and aortic atherosclerosis. Marked improvement on the right with only a small amount of residual pleural density and mild volume loss in the right lower  lobe. Persistent left effusion and left lower lung atelectasis. IMPRESSION: Marked radiographic improvement following right thoracentesis. Small amount of remaining pleural fluid on the right with mild volume loss in the right lower lobe. No postprocedural pneumothorax. No change in left effusion with areas of atelectasis in the left lung. Electronically Signed   By: Nelson Chimes M.D.   On: 01/30/2020 17:03   DG Chest 1 View  Result Date: 01/29/2020 CLINICAL DATA:  86 year old male with tachycardia. EXAM: CHEST  1 VIEW COMPARISON:  Chest radiograph dated 01/23/2020. FINDINGS: There is near complete opacification the right hemithorax consistent with a large pleural effusion with associated near complete compressive atelectasis of the right lung. Pneumonia or underlying mass is not excluded. Clinical correlation and follow-up to resolution recommended. Overall significant interval progression of the right lung opacity compared to the radiograph of 01/23/2020. There is only a small aerated portion  of the right lung. Interval increase in the size of the left pleural effusion with increase in the left lung base atelectasis or infiltrate. No pneumothorax. Stable cardiomegaly. Atherosclerotic calcification of the aorta. Left pectoral pacemaker device. Degenerative changes of the spine. No acute osseous pathology. IMPRESSION: 1. Large right pleural effusion with near complete compressive atelectasis of the right lung. Overall significant progression of right lung opacity since the prior radiograph. Pneumonia or underlying mass is not excluded. Clinical correlation and follow-up to resolution recommended. 2. Interval increase in the size of the left pleural effusion and left lung base atelectasis or infiltrate. Electronically Signed   By: Anner Crete M.D.   On: 01/29/2020 21:05   US Renal  Result Date: 01/23/2020 CLINICAL DATA:  Renal failure. History of bladder cancer. History of prostate cancer. EXAM: RENAL / URINARY TRACT ULTRASOUND COMPLETE COMPARISON:  CT scan 11/03/2019 FINDINGS: Right Kidney: Renal measurements: 8.3 x 4.0 x 4.0 cm = volume: 69.53 mL. Increased echogenicity and renal cortical scarring type changes. A right nephrostomy tube is noted. No hydronephrosis. No worrisome renal lesions are identified. There is a small cyst noted in the midpole region. Left Kidney: Renal measurements: 11.0 x 5.8 x 5.6 cm = volume: 185.88 mL. Slight increased echogenicity but normal renal cortical thickness. A nephrostomy tube is noted. No hydronephrosis. No worrisome renal lesions. Bladder: Slightly thick-walled bladder with echogenic material which could represent hematoma or mass. Other: Small bilateral pleural effusions are noted along with trace ascites. IMPRESSION: 1. Small right kidney and normal size left kidney. 2. Bilateral nephrostomy tubes.  No hydronephrosis. 3. Indeterminate echogenic material in the bladder could be hematoma or mass. 4. Small bilateral pleural effusions. Electronically Signed    By: Marijo Sanes M.D.   On: 01/23/2020 12:49   PERIPHERAL VASCULAR CATHETERIZATION  Result Date: 02/04/2020 See op note  PERIPHERAL VASCULAR CATHETERIZATION  Result Date: 01/28/2020 See op note  DG Chest Port 1 View  Result Date: 02/11/2020 CLINICAL DATA:  Hypoxia EXAM: PORTABLE CHEST 1 VIEW COMPARISON:  02/08/2020 FINDINGS: Left pacer remains in place, unchanged. Aortic atherosclerosis. Right dialysis catheter is unchanged. Moderate to large right pleural effusion and small left pleural effusion. Bilateral airspace disease, diffusely throughout the right lung and in the left lower lobe, similar to prior study. IMPRESSION: No significant change since prior study. Electronically Signed   By: Rolm Baptise M.D.   On: 02/11/2020 09:03   DG Chest Port 1 View  Result Date: 02/08/2020 CLINICAL DATA:  Hypoxia. EXAM: PORTABLE CHEST 1 VIEW COMPARISON:  February 01, 2020. FINDINGS: Stable cardiomegaly. Interval placement of right internal  jugular dialysis catheter with tips in expected position of cavoatrial junction. Left-sided pacemaker is unchanged. No pneumothorax is noted. Large right pleural effusion is noted with probable underlying atelectasis or infiltrate. Left basilar atelectasis or infiltrate is noted with probable associated pleural effusion. Bony thorax is unremarkable. IMPRESSION: Large right pleural effusion is noted with probable underlying atelectasis or infiltrate. Left basilar atelectasis or infiltrate is noted with probable associated pleural effusion. Aortic Atherosclerosis (ICD10-I70.0). Electronically Signed   By: Marijo Conception M.D.   On: 02/08/2020 14:26   DG Chest Port 1 View  Result Date: 02/01/2020 CLINICAL DATA:  Post left thoracentesis EXAM: PORTABLE CHEST 1 VIEW COMPARISON:  Radiograph 01/30/2020 FINDINGS: Interval decrease in size of the left-sided pleural effusions seen on comparison image. No pneumothorax. Persistent heterogeneous airspace opacities in both lungs, improved on  the left likely reflecting lung re-expansion and diminished superimposed opacity from the pleural fluid. Persistent right pleural effusion. Central vascular congestion is similar to comparison. Stable cardiomediastinal contours with a calcified, tortuous aorta. Pacer pack overlies the left chest wall with leads at the right atrium and apex. No acute osseous or soft tissue abnormality. Degenerative changes are present in the imaged spine and shoulders. IMPRESSION: 1. Interval decrease in size of left pleural effusion seen on comparison image. No pneumothorax. 2. Small to moderate right pleural effusion, appears slightly increased from prior. 3. Persistent bilateral heterogeneous airspace opacities, improved on the left likely related to the re-expansion and diminished superimposed opacity from the pleural fluid. Electronically Signed   By: Lovena Le M.D.   On: 02/01/2020 16:35   DG Chest Port 1 View  Result Date: 01/23/2020 CLINICAL DATA:  Shortness of breath EXAM: PORTABLE CHEST 1 VIEW COMPARISON:  September 24, 2019 FINDINGS: There is stable cardiomegaly. Aortic knob calcifications. Hazy interstitial opacities are seen throughout both lungs right greater than left. There is a moderate right effusion present. Hazy airspace opacity at the right lung base, likely layering effusions/atelectasis. A left-sided pacemaker again noted. No acute osseous abnormality. IMPRESSION: Findings suggestive of interstitial edema. Moderate right pleural effusion with adjacent hazy airspace opacity, likely layering effusions/atelectasis. Electronically Signed   By: Prudencio Pair M.D.   On: 01/23/2020 15:45   IR NEPHROSTOMY EXCHANGE RIGHT  Result Date: 01/31/2020 INDICATION: Decreased and bloody output from right nephrostomy tube. EXAM: RIGHT NEPHROSTOMY TUBE CHECK. COMPARISON:  ULTRASOUND 01/23/2020. Nephrostomy tube exchange 12/28/2019. MEDICATIONS: None. ANESTHESIA/SEDATION: None. CONTRAST:  Ten cc of nonionic-administered into  the collecting system(s) FLUOROSCOPY TIME:  Fluoroscopy Time: 0 minutes 20 seconds (5.7 mGy). COMPLICATIONS: None immediate. PROCEDURE: Informed written consent was obtained from the patient after a thorough discussion of the procedural risks, benefits and alternatives. All questions were addressed. Maximal Sterile Barrier Technique was utilized including caps, mask, sterile gowns, sterile gloves, sterile drape, hand hygiene and skin antiseptic. A timeout was performed prior to the initiation of the procedure. Right nephrostomy tube in stable position. Nephrostomy tube was suction and Nonionic contrast was injected into the nephrostomy tube. Nephrostomy tube is widely patent. Nephrostomy tube in stable position within the renal collecting system. The tube was placed to gravity drainage. IMPRESSION: Right nephrostomy tube aspirated. Contrast administered demonstrating widely patent right nephrostomy tube in good and stable position. No tube exchange was needed. Electronically Signed   By: Marcello Moores  Register   On: 01/31/2020 15:23   US THORACENTESIS ASP PLEURAL SPACE W/IMG GUIDE  Result Date: 02/01/2020 INDICATION: Pleural effusions, shortness of breath EXAM: ULTRASOUND GUIDED LEFT THORACENTESIS MEDICATIONS: 1% lidocaine local COMPLICATIONS:  None immediate. PROCEDURE: An ultrasound guided thoracentesis was thoroughly discussed with the patient and questions answered. The benefits, risks, alternatives and complications were also discussed. The patient understands and wishes to proceed with the procedure. Written consent was obtained. Ultrasound was performed to localize and mark an adequate pocket of fluid in the left chest. The area was then prepped and draped in the normal sterile fashion. 1% Lidocaine was used for local anesthesia. Under ultrasound guidance a 6 Fr Safe-T-Centesis catheter was introduced. Thoracentesis was performed. The catheter was removed and a dressing applied. FINDINGS: A total of  approximately 750 cc of serosanguineous pleural fluid was removed. Samples were sent to the laboratory as requested by the clinical team. IMPRESSION: Successful ultrasound guided left thoracentesis yielding 750 cc of pleural fluid. Electronically Signed   By: Jerilynn Mages.  Shick M.D.   On: 02/01/2020 14:52   US THORACENTESIS ASP PLEURAL SPACE W/IMG GUIDE  Result Date: 01/30/2020 INDICATION: 86 year old male with a history of pleural effusion, referred for thoracentesis EXAM: ULTRASOUND GUIDED RIGHT THORACENTESIS MEDICATIONS: None. COMPLICATIONS: None PROCEDURE: An ultrasound guided thoracentesis was thoroughly discussed with the patient and questions answered. The benefits, risks, alternatives and complications were also discussed. The patient understands and wishes to proceed with the procedure. Written consent was obtained. Ultrasound was performed to localize and mark an adequate pocket of fluid in the right chest. The area was then prepped and draped in the normal sterile fashion. 1% Lidocaine was used for local anesthesia. Under ultrasound guidance a 8 Fr Safe-T-Centesis catheter was introduced. Thoracentesis was performed. The catheter was removed and a dressing applied. FINDINGS: A total of approximately 2400 cc of thin yellow fluid was removed. Samples were sent to the laboratory as requested by the clinical team. IMPRESSION: Status post ultrasound-guided right-sided thoracentesis. Electronically Signed   By: Corrie Mckusick D.O.   On: 01/30/2020 15:35    (Echo, Carotid, EGD, Colonoscopy, ERCP)    Subjective: Patient seen and examined.  Getting hemodialysis today.  Sitting up in chair.  No visible distress.  Answers questions appropriately.  Stable for discharge to skilled nursing facility.  Discharge Exam: Vitals:   02/11/20 1100 02/11/20 1115  BP: 121/83 (!) 127/101  Pulse: (!) 132 (!) 130  Resp: (!) 25 (!) 22  Temp:    SpO2:     Vitals:   02/11/20 1030 02/11/20 1045 02/11/20 1100 02/11/20 1115   BP: (!) 131/101 (!) 134/82 121/83 (!) 127/101  Pulse: (!) 132 (!) 121 (!) 132 (!) 130  Resp: 23 (!) 26 (!) 25 (!) 22  Temp:      TempSrc:      SpO2:      Weight:      Height:        General: Pt is alert, awake, not in acute distress, appears stated age Cardiovascular: Regular rate, irregular rhythm, no murmurs Respiratory: Scattered crackles bilaterally.  Normal work of breathing Abdominal: Soft, NT, ND, bowel sounds + Extremities: no edema, no cyanosis    The results of significant diagnostics from this hospitalization (including imaging, microbiology, ancillary and laboratory) are listed below for reference.     Microbiology: Recent Results (from the past 240 hour(s))  Body fluid culture     Status: None   Collection Time: 02/01/20  2:00 PM   Specimen: PATH Cytology Pleural fluid  Result Value Ref Range Status   Specimen Description   Final    PLEURAL Performed at [facility], [address] [place], [place] [address]  Special Requests   Final    NONE Performed at [facility], [place], [place] [address]    Gram Stain NO WBC SEEN NO ORGANISMS SEEN   Final   Culture   Final    NO GROWTH 3 DAYS Performed at [facility], [place] [address], [place], [place] [address]    Report Status 02/05/2020 FINAL  Final  MRSA PCR Screening     Status: Abnormal   Collection Time: 02/06/20 10:02 AM   Specimen: Nasopharyngeal  Result Value Ref Range Status   MRSA by PCR POSITIVE (A) NEGATIVE Final    Comment:        The GeneXpert MRSA Assay (FDA approved for NASAL specimens only), is one component of a comprehensive MRSA colonization surveillance program. It is not intended to diagnose MRSA infection nor to guide or monitor treatment for MRSA infections. RESULT CALLED TO, READ BACK BY AND VERIFIED WITH: MORALES JORGE AT 6195 ON 02/06/2020 BY SAINVILUS S Performed at [facility], [address] [place], [place]  [address]      Labs: BNP (last 3 results) Recent Labs    09/24/19 1530 01/23/20 0948 01/29/20 1046  BNP 982.0* 2,109.0* 7,124.5*   Basic Metabolic Panel: Recent Labs  Lab 02/07/20 0540 02/08/20 0519 02/09/20 0554 02/10/20 0503 02/11/20 0918  NA 139 138 138 136 134*  K 4.3 4.9 4.8 4.3 4.3  CL 96* 98 95* 94* 94*  CO2 31 29 31 28 30   GLUCOSE 130* 126* 130* 111* 111*  BUN 35* 43* 30* 43* 54*  CREATININE 4.59* 5.53* 4.46* 5.23* 5.96*  CALCIUM 7.7* 7.9* 8.1* 8.1* 7.8*   Liver Function Tests: No results for input(s): AST, ALT, ALKPHOS, BILITOT, PROT, ALBUMIN in the last 168 hours. No results for input(s): LIPASE, AMYLASE in the last 168 hours. No results for input(s): AMMONIA in the last 168 hours. CBC: Recent Labs  Lab 02/05/20 0427 02/06/20 1053 02/07/20 0540 02/08/20 0519 02/11/20 0918  WBC 6.3 5.7 5.3 5.8 5.2  NEUTROABS  --  4.5 4.1 4.5 4.2  HGB 8.8* 8.5* 8.2* 8.1* 8.5*  HCT 26.9* 26.3* 25.2* 26.2* 27.2*  MCV 88.5 88.3 88.4 91.6 90.7  PLT 106* 96* 83* 84* 95*   Cardiac Enzymes: No results for input(s): CKTOTAL, CKMB, CKMBINDEX, TROPONINI in the last 168 hours. BNP: Invalid input(s): POCBNP CBG: Recent Labs  Lab 02/10/20 0743 02/10/20 1207 02/10/20 1704 02/10/20 2109 02/11/20 0754  GLUCAP 98 134* 137* 156* 103*   D-Dimer No results for input(s): DDIMER in the last 72 hours. Hgb A1c No results for input(s): HGBA1C in the last 72 hours. Lipid Profile No results for input(s): CHOL, HDL, LDLCALC, TRIG, CHOLHDL, LDLDIRECT in the last 72 hours. Thyroid function studies No results for input(s): TSH, T4TOTAL, T3FREE, THYROIDAB in the last 72 hours.  Invalid input(s): FREET3 Anemia work up No results for input(s): VITAMINB12, FOLATE, FERRITIN, TIBC, IRON, RETICCTPCT in the last 72 hours. Urinalysis    Component Value Date/Time   COLORURINE RED (A) 01/31/2020 1030   APPEARANCEUR TURBID (A) 01/31/2020 1030   APPEARANCEUR Cloudy (A) 03/05/2019 1629    LABSPEC 1.030 01/31/2020 1030   LABSPEC 1.029 12/29/2012 1554   PHURINE  01/31/2020 1030    TEST NOT REPORTED DUE TO COLOR INTERFERENCE OF URINE PIGMENT   GLUCOSEU (A) 01/31/2020 1030    TEST NOT REPORTED DUE TO COLOR INTERFERENCE OF URINE PIGMENT   GLUCOSEU see comment 12/29/2012 1554   HGBUR (A) 01/31/2020  1030    TEST NOT REPORTED DUE TO COLOR INTERFERENCE OF URINE PIGMENT   BILIRUBINUR (A) 01/31/2020 1030    TEST NOT REPORTED DUE TO COLOR INTERFERENCE OF URINE PIGMENT   BILIRUBINUR Negative 03/05/2019 1629   BILIRUBINUR see comment 12/29/2012 1554   KETONESUR (A) 01/31/2020 1030    TEST NOT REPORTED DUE TO COLOR INTERFERENCE OF URINE PIGMENT   PROTEINUR (A) 01/31/2020 1030    TEST NOT REPORTED DUE TO COLOR INTERFERENCE OF URINE PIGMENT   NITRITE (A) 01/31/2020 1030    TEST NOT REPORTED DUE TO COLOR INTERFERENCE OF URINE PIGMENT   LEUKOCYTESUR (A) 01/31/2020 1030    TEST NOT REPORTED DUE TO COLOR INTERFERENCE OF URINE PIGMENT   LEUKOCYTESUR see comment 12/29/2012 1554   Sepsis Labs Invalid input(s): PROCALCITONIN,  WBC,  LACTICIDVEN Microbiology Recent Results (from the past 240 hour(s))  Body fluid culture     Status: None   Collection Time: 02/01/20  2:00 PM   Specimen: PATH Cytology Pleural fluid  Result Value Ref Range Status   Specimen Description   Final    PLEURAL Performed at [facility], [address], [place], [place] [address]    Special Requests   Final    NONE Performed at [facility], [place], [place] [address]    Gram Stain NO WBC SEEN NO ORGANISMS SEEN   Final   Culture   Final    NO GROWTH 3 DAYS Performed at [facility], [place] [address] [place], [place] [address]    Report Status 02/05/2020 FINAL  Final  MRSA PCR Screening     Status: Abnormal   Collection Time: 02/06/20 10:02 AM   Specimen: Nasopharyngeal  Result Value Ref Range Status   MRSA by PCR POSITIVE (A) NEGATIVE Final    Comment:         The GeneXpert MRSA Assay (FDA approved for NASAL specimens only), is one component of a comprehensive MRSA colonization surveillance program. It is not intended to diagnose MRSA infection nor to guide or monitor treatment for MRSA infections. RESULT CALLED TO, READ BACK BY AND VERIFIED WITH: MORALES JORGE AT 9147 ON 02/06/2020 BY Epifanio Lesches S Performed at [facility], [address] [place], [place] [address]      Time coordinating discharge: Over 30 minutes  SIGNED:   Sidney Ace, MD  [facility] 02/11/2020, 11:43 AM Pager   If 7PM-7AM, please contact night-coverage

## 2020-02-11 NOTE — Progress Notes (Signed)
PT Cancellation Note  Patient Details Name: Blake [name] MRN: [id number] DOB: Sep 01, 1922   Cancelled Treatment:    Reason Eval/Treat Not Completed: Patient at procedure or test/unavailable.  Pt currently at dialysis.  Will re-attempt PT treatment session at a later date/time.  Leitha Bleak, PT 02/11/20, 9:40 AM

## 2020-02-12 ENCOUNTER — Telehealth: Payer: Self-pay | Admitting: Radiology

## 2020-02-12 NOTE — Telephone Encounter (Signed)
Since he is now on dialysis it can be delayed 2-4 weeks

## 2020-02-12 NOTE — Telephone Encounter (Signed)
Daughter states patient was discharged yesterday after 2 week hospital stay where he began dialysis. He is due for a nephrostomy tube exchange this week but is weak from dialysis and the hospital stay. How soon should exchange be scheduled? Please advise.

## 2020-02-15 ENCOUNTER — Ambulatory Visit: Payer: Medicare PPO

## 2020-02-29 ENCOUNTER — Ambulatory Visit: Payer: Medicare PPO

## 2020-03-07 ENCOUNTER — Other Ambulatory Visit: Payer: Self-pay

## 2020-03-07 ENCOUNTER — Ambulatory Visit
Admission: RE | Admit: 2020-03-07 | Discharge: 2020-03-07 | Disposition: A | Discharge status: Home / Self Care | Payer: Medicare PPO | Source: Ambulatory Visit | Attending: Urology | Admitting: Urology

## 2020-03-07 DIAGNOSIS — N1330 Unspecified hydronephrosis: Secondary | ICD-10-CM | POA: Diagnosis not present

## 2020-03-07 DIAGNOSIS — Z436 Encounter for attention to other artificial openings of urinary tract: Secondary | ICD-10-CM | POA: Insufficient documentation

## 2020-03-07 HISTORY — PX: IR NEPHROSTOMY EXCHANGE LEFT: IMG6069

## 2020-03-07 HISTORY — PX: IR NEPHROSTOMY EXCHANGE RIGHT: IMG6070

## 2020-03-07 MED ORDER — IODIXANOL 320 MG/ML IV SOLN
50.0000 mL | Freq: Once | INTRAVENOUS | Status: AC
  Administered 2020-03-07: 10:00:00 10 mL

## 2020-03-07 NOTE — Discharge Instructions (Signed)
Suprapubic Catheter Replacement  Suprapubic catheter replacement is a procedure to remove an old catheter and insert a new, clean catheter. A suprapubic catheter is a rubber tube that drains urine from the bladder into a collection bag outside the body. The catheter is inserted into the bladder through a small opening in the lower abdomen, near the center of the body, above the pubic bone (suprapubicarea). There is a tiny balloon filled with germ-free (sterile) water on the end of the catheter that is in the bladder. The balloon helps to keep the catheter in place. If you need to wear a catheter for a long period of time, you may be instructed to replace the catheter yourself. Usually, suprapubic catheters need to be replaced every 4-6 weeks, or as often as told by your health care provider. What are the risks? Generally, this is a safe procedure. However, problems may occur, including failure to get the catheter into the bladder. What happens before the procedure?  You may have an exam or testing, including a blood or urine sample.  Ask your health care provider what steps will be taken to help prevent infection. What happens during the procedure?  You will lie on your back.  The water from the balloon will be removed using a syringe.  The catheter will be slowly removed.  Lubricant will be applied to the end of the new catheter that will go into your bladder.  The new catheter will be inserted through the opening in your abdomen. Your health care provider will slide the catheter into your bladder.  Your health care provider will wait for some urine to start flowing through the catheter. When this happens, a syringe will be used to fill the balloon with sterile water.  A collection bag will be attached to the end of the catheter. The procedure may vary among health care providers and hospitals. What can I expect after procedure? After the procedure, it is common to have:  Some  discomfort around the opening in your abdomen. Follow these instructions at home: Caring for the skin around the catheter Use a clean washcloth and soapy water to clean the skin around your catheter every day. Pat the area dry with a clean towel.  Do not pull on the catheter.  Do not use ointment or lotion on this area unless told by your health care provider.  Check the skin around the catheter every day for signs of infection. Check for: ? Redness, swelling, or pain. ? Fluid or blood. ? Warmth. ? Pus or a bad smell. Caring for the catheter  Clean the catheter with soap and water as often as told by your health care provider.  Always make sure there are no twists or curls (kinks) in the catheter.  As soon as you are able to move, you may use a leg bag to collect the urine. ? Make sure that the tubing is straight and without kinks. ? Wrap an ace bandage gently over the tubing and around your leg to minimize the risk of the bag getting pulled out. Emptying the collection bag Empty the large collection bag every 8 hours. Empty the small collection bag when it is about ? full. To empty your large or small collection bag, take the following steps:  Always keep the bag below the level of the catheter. This keeps urine from flowing backward into the catheter.  Hold the bag over the toilet or another container. Turn the valve (spigot) at the bottom of   the bag to empty the urine. ? Do not touch the opening of the spigot. ? Do not let the opening touch the toilet or container.  Close the spigot tightly when the bag is empty. Cleaning the collection bag   Wash your hands with soap and water. If soap and water are not available, use hand sanitizer.  Disconnect the bag from the catheter and immediately attach a new bag to the catheter.  Empty the used bag completely.  Clean the used bag according to the manufacturer's instructions, or as told by your health care provider.  Let the bag  dry completely, and put it in a clean plastic bag before storing it. General instructions   Always wash your hands before and after caring for your catheter and collection bag. Use soap and water. If soap and water are not available, use hand sanitizer.  Always make sure there are no leaks in the catheter or collection bag.  Drink enough fluid to keep your urine pale yellow.  If you were prescribed an antibiotic medicine, take it as told by your health care provider. Do not stop taking the antibiotic even if you start to feel better.  Do not take baths, swim, or use a hot tub.  Keep all follow-up visits as told by your health care provider. This is important. Contact a health care provider if:  You leak urine.  You have redness, swelling, or pain around your catheter opening.  You have fluid or blood coming from your catheter opening.  Your catheter opening feels warm to the touch.  You have pus or a bad smell coming from your catheter opening.  You have a fever or chills.  Your urine flow slows down.  Your urine becomes cloudy or smelly. Get help right away if:  Your catheter comes out.  You feel nauseous.  You have back pain.  You have difficulty changing your catheter.  You have blood in your urine.  You have no urine flow for 1 hour. Summary  Suprapubic catheter replacement is a procedure to remove an old catheter and insert a new, clean catheter.  Make sure that you understand how to care for your catheter, your collection bag, and the opening in your abdomen.  Always wash your hands before and after caring for your catheter and collection bag.  Contact a health care provider if you leak urine or have a fever or any signs of infection around the catheter opening, or if your urine becomes cloudy or smelly.  Get help right away if your catheter comes out or you have nausea, back pain, blood in your urine, no urine flow for 1 hour, or difficulty changing your  catheter. This information is not intended to replace advice given to you by your health care provider. Make sure you discuss any questions you have with your health care provider. Document Revised: 02/15/2018 Document Reviewed: 02/15/2018 Elsevier Patient Education  [place].

## 2020-03-07 NOTE — Procedures (Signed)
  Procedure: Exchange bilat Perc neph tubes 66f EBL:   minimal Complications:  none immediate  See full dictation in [facility].  Dillard Cannon MD Main # [phone] Pager  [phone]

## 2020-03-18 ENCOUNTER — Other Ambulatory Visit: Payer: Self-pay | Admitting: Radiology

## 2020-03-18 DIAGNOSIS — N1330 Unspecified hydronephrosis: Secondary | ICD-10-CM

## 2020-03-24 ENCOUNTER — Telehealth: Payer: Self-pay

## 2020-03-24 NOTE — Telephone Encounter (Signed)
Spoke with patient's daughter who shared that patient has been sleeping more through out the day. He is verbalizing wanting to d/c dialysis. Patient has a wound on his sacrum that is causing him discomfort as well. Primary Palliative NP out of the office this week. Will follow up with other providers to schedule visit.

## 2020-03-24 NOTE — Telephone Encounter (Signed)
Visit scheduled with Katie NP for 03/25/20 @ 8:30am. Daughter, Rosalyn, updated.

## 2020-03-25 ENCOUNTER — Other Ambulatory Visit: Payer: Self-pay

## 2020-03-25 ENCOUNTER — Other Ambulatory Visit: Payer: Medicare PPO | Admitting: Primary Care

## 2020-03-25 DIAGNOSIS — E43 Unspecified severe protein-calorie malnutrition: Secondary | ICD-10-CM

## 2020-03-25 DIAGNOSIS — Z515 Encounter for palliative care: Secondary | ICD-10-CM

## 2020-03-25 DIAGNOSIS — I5033 Acute on chronic diastolic (congestive) heart failure: Secondary | ICD-10-CM

## 2020-03-25 DIAGNOSIS — N186 End stage renal disease: Secondary | ICD-10-CM

## 2020-03-25 DIAGNOSIS — N184 Chronic kidney disease, stage 4 (severe): Secondary | ICD-10-CM

## 2020-03-25 DIAGNOSIS — L89302 Pressure ulcer of unspecified buttock, stage 2: Secondary | ICD-10-CM

## 2020-03-25 NOTE — Progress Notes (Signed)
[facility] Consult Note Telephone: [phone]  Fax: [phone]  PATIENT NAME: Blake [name] [address] [place] [address] [phone] (home)  DOB: 12-18-1922 MRN: [id number]  PRIMARY CARE PROVIDER:    Adin Hector, MD,  [address] [place] [address] [phone]  REFERRING PROVIDER:   Adin Hector, [place] [facility] [place],  [place] [address] [phone]  RESPONSIBLE PARTY:   Extended Emergency Contact Information Primary Emergency Contact: Evans Lance Address: [address]          [place], [place] [address] Johnnette Litter of [place] Phone: [phone] Mobile Phone: [phone] Relation: Daughter Secondary Emergency Contact: Mamie Nick [place] of [place] Phone: [phone] Mobile Phone: [phone] Relation: Son  I met face to face with patient and family in home.  ASSESSMENT AND RECOMMENDATIONS:   1. Advance Care Planning/Goals of Care: Goals include to maximize quality of life and symptom management. Our advance care planning conversation included a discussion about:     The value and importance of advance care planning   Experiences with loved ones who have been seriously ill or have died   Exploration of personal, cultural or spiritual beliefs that might influence medical decisions   Exploration of goals of care in the event of a sudden injury or illness   Identification of a healthcare agent   creation of an  advance directive document . I met with son Gavyn. Daughter was at work. We discussed the goal of care, Mr Edgerly has been taking dialysis and has gotten weaker over the past few weeks. He has a right mouth droop today which  is a new finding. We discussed the course of HD, that either patient elects to stop or the treatment ceases to be effective, or a combination. We discussed advancing disease and  geriatric syndrome of decline. Son states pt has asked to stop HD and is home today, in bed with oxygen at 2 L. I then had a conversation with daughter Brunetta Genera sharing findings and answering questions. She agreed his HD treatments were not making his QOL better and concurred that she wanted him to have supportive and comfort care.  I am seeing extensive decline and debility. I recommended hospice at this time, as he seems to be making daily changes. I outlined hospice as a supportive service which can manage changing symptoms actively and provide RN, Aide, chaplain and SW, as well as option for inpatient if needed. I will phone daughter Brunetta Genera as well to discuss.   Message left for Dr. Caryl Comes to request hospice orders and if he will attend.  2. Symptom Management:   Goals of care and Debility: Has been in bed for a week, getting weaker and asking to not go to HD. Today he is moving arms with help, and follows simple commands.  He is not conversant;  Able to interact verbally but appears fatigued.  Family reports rapid decline. He has a right mouth droop which is new, although no additional loss of movement/ involvement.  Recent albumin 2.9, Hgt 8.8. Had transfusion in early August.   We discussed hospice services and home health services, supportive vs rehab. Pt currently has PT in the home from [facility]. Son had requested continuing PT but sister has elected hospice. Patient likely has very little rehabilitative potential at this point in his disease process.  3. Follow up Palliative Care Visit:  Recommend hospice admission asap if family chooses to cease HD. Palliative care will continue to follow for goals of care clarification and symptom management in needed Return 1 week or prn.  4. Family /Caregiver/Community Supports: Lives in home with wife and daughter. Has hired care givers and family support.  5. Cognitive / Functional decline: A and O x 1-2. Bedbound, PPS 30%.  I spent 60 minutes  providing this consultation,  from 0830 to 0930. More than 50% of the time in this consultation was spent coordinating communication.   CHIEF COMPLAINT: debility and decline from co morbid disease processes, sacral ulcer, ceasing HD.  HISTORY OF PRESENT ILLNESS:  KHAMERON GRUENWALD is a 86 y.o. year old male with multiple medical problems including CHF, ESRD, DM, pacemaker. Palliative Care was asked to follow this patient by consultation request of Adin Hector, MD to help address advance care planning and goals of care. This is the initial visit.  CODE STATUS: DNR left in home, MOST left. Daughter will review with her brother  PPS: 30%  HOSPICE ELIGIBILITY/DIAGNOSIS: yes, with concordant goals of care.  PAST MEDICAL HISTORY:  Past Medical History:  Diagnosis Date  . AAA (abdominal aortic aneurysm) ([place])   . Arrhythmia   . CHF (congestive heart failure) ([place])   . Diabetes mellitus without complication ([place])   . GERD (gastroesophageal reflux disease)   . GI bleed   . Heart murmur   . Hyperlipidemia   . Hypertension   . Pacemaker   . Pneumonia   . Prostate cancer ([place])   . Sleep apnea     SOCIAL HX:  Social History   Tobacco Use  . Smoking status: Former Research scientist (life sciences)  . Smokeless tobacco: Never Used  Substance Use Topics  . Alcohol use: No   FAMILY HX:  Family History  Problem Relation Age of Onset  . Hypertension Other   . Stroke Other   . Heart attack Other   . Stroke Sister   . Heart attack Brother     ALLERGIES:  Allergies  Allergen Reactions  . Cefepime     seizure  . Diovan [Valsartan] Cough  . Gabapentin Other (See Comments)    Sedation at all doses  . Hydralazine Itching  . Lisinopril Cough  . Pregabalin Other (See Comments)    Sedation at all doses  . Levofloxacin Other (See Comments)    Too many side effects. Nausea, vomiting, upset stomach, increased confusion, etc Other reaction(s): Confusion Nausea, chills Nausea, chills   .  Sulfamethoxazole-Trimethoprim Other (See Comments)    Too many side effects. Nausea, vomiting, upset stomach, increased confusion, etc     PERTINENT MEDICATIONS:  Outpatient Encounter Medications as of 03/25/2020  Medication Sig  . azelastine (ASTELIN) 0.1 % nasal spray SMARTSIG:1 Spray(s) Both Nares Twice Daily PRN  . feeding supplement, GLUCERNA SHAKE, (GLUCERNA SHAKE) LIQD Take 237 mLs by mouth 2 (two) times daily between meals.  . ferrous sulfate 325 (65 FE) MG tablet Take 1 tablet (325 mg total) by mouth daily with breakfast.  . finasteride (PROSCAR) 5 MG tablet Take 5 mg by mouth daily.  . Iron-Vitamin C (VITRON-C PO) Take 1 tablet by mouth daily.  . metoprolol tartrate (LOPRESSOR) 50 MG tablet Take 1 tablet (50 mg total) by mouth 2 (two) times daily.  . pantoprazole (PROTONIX) 40 MG tablet Take 40 mg by mouth daily.  . rosuvastatin (CRESTOR) 5 MG tablet Take 5 mg by mouth at bedtime.  . sitaGLIPtin (JANUVIA) 50  MG tablet Take 0.5 tablets (25 mg total) by mouth daily.  . tamsulosin (FLOMAX) 0.4 MG CAPS capsule Take 0.4 mg by mouth daily.    No facility-administered encounter medications on file as of 03/25/2020.    PHYSICAL EXAM / ROS:  113/62  HR 115, RR 20  Current and past weights: 211 lbs on Epic record, recent not available. General: NAD, frail and ill  appearing, WNWD Cardiovascular: S2S2, rapid rate, no chest pain reported, no LE  edema  Pulmonary: + Productive cough, no increased SOB, 97% on 2 L oxygen Abdomen: appetite fair,abd soft, non tender GU: denies dysuria, HD patient, bil nephrostomies MSK:  ++ joint and ROM abnormalities, non ambulatory, bed bound Skin: decubitus reported on sacrum stage 2 Neurological: Weakness, new onset right mouth drop, moves extremities with effort, a and o x 1-2,   Jason Coop, NP , DNP, MPH, [facility]  COVID-19 PATIENT SCREENING TOOL  Person answering questions: ____________family ______ _____   1.  Is the patient or any  family member in the home showing any signs or symptoms regarding respiratory infection?               Person with Symptom- __________NA_________________  a. Fever                                                                          Yes___ No___          ___________________  b. Shortness of breath                                                    Yes___ No___          ___________________ c. Cough/congestion                                       Yes___  No___         ___________________ d. Body aches/pains                                                         Yes___ No___        ____________________ e. Gastrointestinal symptoms (diarrhea, nausea)           Yes___ No___        ____________________  2. Within the past 14 days, has anyone living in the home had any contact with someone with or under investigation for COVID-19?    Yes___ No_X_   Person __________________

## 2020-04-11 DEATH — deceased

## 2020-04-25 ENCOUNTER — Ambulatory Visit: Payer: Medicare PPO
